# Patient Record
Sex: Female | Born: 1955 | Race: Black or African American | Hispanic: No | Marital: Married | Smoking: Former smoker
Health system: Southern US, Community
[De-identification: ages and names within clinical notes are randomized; demographics above are authoritative.]

## PROBLEM LIST (undated history)

## (undated) DIAGNOSIS — E785 Hyperlipidemia, unspecified: Secondary | ICD-10-CM

## (undated) DIAGNOSIS — I10 Essential (primary) hypertension: Secondary | ICD-10-CM

## (undated) DIAGNOSIS — C541 Malignant neoplasm of endometrium: Secondary | ICD-10-CM

## (undated) DIAGNOSIS — K089 Disorder of teeth and supporting structures, unspecified: Secondary | ICD-10-CM

## (undated) DIAGNOSIS — K224 Dyskinesia of esophagus: Secondary | ICD-10-CM

## (undated) DIAGNOSIS — J45909 Unspecified asthma, uncomplicated: Secondary | ICD-10-CM

## (undated) DIAGNOSIS — R011 Cardiac murmur, unspecified: Secondary | ICD-10-CM

## (undated) DIAGNOSIS — N183 Chronic kidney disease, stage 3 unspecified: Secondary | ICD-10-CM

## (undated) DIAGNOSIS — E66813 Obesity, class 3: Secondary | ICD-10-CM

## (undated) DIAGNOSIS — L8990 Pressure ulcer of unspecified site, unspecified stage: Secondary | ICD-10-CM

## (undated) DIAGNOSIS — E119 Type 2 diabetes mellitus without complications: Secondary | ICD-10-CM

## (undated) HISTORY — DX: Dyskinesia of esophagus: K22.4

## (undated) HISTORY — DX: Obesity, class 3: E66.813

## (undated) HISTORY — DX: Pressure ulcer of unspecified site, unspecified stage: L89.90

## (undated) HISTORY — PX: TYMPANOSTOMY TUBE PLACEMENT: SHX32

## (undated) HISTORY — DX: Disorder of teeth and supporting structures, unspecified: K08.9

## (undated) HISTORY — DX: Hyperlipidemia, unspecified: E78.5

## (undated) HISTORY — DX: Chronic kidney disease, stage 3 unspecified: N18.30

## (undated) HISTORY — DX: Malignant neoplasm of endometrium: C54.1

## (undated) HISTORY — DX: Morbid (severe) obesity due to excess calories: E66.01

## (undated) NOTE — *Deleted (*Deleted)
Radiation Oncology         [phone] ________________________________  Initial Outpatient Consultation  Name: Vanessa [name] MRN: [id number]  Date: 04/22/2020  DOB: 17-Feb-1956  CC:Pcp, No  Lafonda Mosses, MD   REFERRING PHYSICIAN: Lafonda Mosses, MD  DIAGNOSIS: There were no encounter diagnoses.  Stage I endometrioid adenocarcinoma, FIGO grade 3  HISTORY OF PRESENT ILLNESS::Vanessa [name] is a 5 y.o. female who is seen as a courtesy of Dr. Berline Lopes for an opinion concerning radiation therapy as part of management for her recently diagnosed endometrial cancer. Today, she is accompanied by ***. The patient presented to the ED on 09/14/2019 with complaints of elevated blood pressure and diffuse pruritis. Labs revealed a Hemoglobin level of 4.8 and Platelet count of 115. After further discussion, the patient stated that she had been having intermittent post-menopausal vaginal bleeding for six months. Transabdominal ultrasound of the pelvis was performed and showed a poorly defined endometrium. However, it appeared thickened and measured approximately 8-9 mm. The patient was admitted to the hospital for further evaluation and management.   While hospitalized, an abdominal/pelvic CT scan was performed on 09/15/2019 that was sub-optimal in the absence of IV contrast. However, it did show anemia, a small right-sided pleural effusion, trace abdominal ascites, and an atrophic left kidney that may have been secondary to a distal left ureteral stone that measured 7 mm. On that same day, the patient was evaluated by Dr. Kennon Rounds, OB/GYN, who recommended beginning Megace to stop the bleeding. She also recommended endometrial sampling +/- PAP smear.  As an inpatient, she was evaluated by Dr. Burr Medico on 09/17/2019, during which time it was recommended that she proceed with endometrial biopsy. Echocardiogram on 09/18/2019 showed an EF of 55-60%. PAP smear was performed on 09/19/2019 and revealed  atypical endometrial cells. An endometrial biopsy was also performed on that same day by Dr. Vevelyn Francois that revealed FIGO grade 2 endometrial adenocarcinoma. There was also a fragment of endometrial type polyp. The patient was stabilized and discharged to a skilled nursing facility on 09/25/2019.  The patient was seen in the ED again on 10/04/2019 for low hemoglobin levels and was once again admitted to the hospital for symptomatic anemia/GI bleed. While hospitalized, she underwent an upper endoscopy on 10/08/2019 by Dr. Watt Climes. Results showed a tiny hiatal hernia, a normal stomach, a non-bleeding small duodenal ulcer with no stigmata of bleeding, and a normal duodenum. Pathology from the procedure revealed mild, chronic, inactive gastritis of the stomach without evidence of H. Pylori, dysplasia, or malignancy. The patient was stabilized and discharged back to a skilled nursing facility on 10/10/2019 with a six-week course of IV antibiotics for bacteremia.  Given the above endometrial findings, the patient was referred to Dr. Berline Lopes and was seen in consultation on 10/17/2019. At that time, given her multiple co-morbidities, recent hospitalizations, and ongoing treatment of bacteremia, she was not considered a surgical candidate. Thus, it was recommended that the Megace be discontinued and that a hormonal IUD be placed.  The patient was seen in the ED again on 10/18/2019 with chief complaint of facial swelling. Chest x-ray at that time showed an ill-defined opacity in the right mid and lower lung zones, likely due to a degree of pneumonia with superimposed small pleural effusion. There was also some mild left base atelectasis. No adenopathy was evident. The patient was admitted to the hospital for worsening renal function, diffuse anasarca, markedly elevated BP, and concern for new onset CHF. She was stabilized and discharged  on 10/21/2019.  The patient was seen in the ED again on 11/05/2019 with chief complaint  of weakness. She was diagnosed with COVID-19 pneumonia and was admitted to the hospital. While hospitalized, she underwent a CT of chest, abdomen, and pelvis on 11/07/2019. Results showed multifocal geographic patchy ground-glass opacities throughout both lungs, consistent with COVID-19 pneumonia. It also showed fluid overload with moderate right and small left pleural effusion. Additionally, there was small to moderate volume abdominopelvic ascites, generalized subcutaneous edema of the chest and abdomen consistent with third-spacing, confluent edema in the flank, chronic left renal atrophy, unchanged 7 mm calcification in the left pelvis equivocally within the ureter, and question of nodular hepatic contours.  Given the CT findings above, the patient underwent an abdominal ultrasound on 11/16/2019. Results showed gallbladder sludge with associated gallbladder wall thickening, nodular appearance of the liver which could be seen in patients with cirrhosis, small volume abdominal ascites, and new right-sided pelviectasis of unknown clinical significance. There was no discrete hepatic mass. The patient was stabilized and discharged on 11/22/2019.  The patient was seen in follow-up with Dr. Berline Lopes on 12/02/2019, during which time she underwent a Mirena IUD insertion. She tolerated the procedure well.  Most recently, the patient was seen in the ED on 02/14/2020 for leg and arm pain s/p fall. Chest x-ray showed mildly increased brochovascular lung markings bilaterally without evidence of focal consolidation. There was also noted to be a small right pleural effusion. X-ray of left forearm was negative. X-ray of left humerus showed a proximal humeral fracture. The patient was discharged home with close orthopedic follow-up.   The patient was last seen by Dr. Berline Lopes on 02/21/2020, during which time an endometrial biopsy was performed. Results showed FIGO grade 3 endometrioid adenocarcinoma with focal secretory  features.  The patient's case was discussed at the Gynecologic Oncology Multi-Disciplinary Conference on 03/02/2020. It was recommended that she proceed with radiation/external beam radiation with Heyman's capsules.  PREVIOUS RADIATION THERAPY: No  PAST MEDICAL HISTORY:  Past Medical History:  Diagnosis Date  . Anemia 10/09/2019  . Asthma   . Cellulitis and abscess of lower extremity 04/22/2020  . CKD (chronic kidney disease) stage 3, GFR 30-59 ml/min (HCC)   . Dental disease   . Diabetes mellitus without complication ([place])   . Endometrial cancer ([place])   . Esophageal dysmotility   . Heart murmur   . HLD (hyperlipidemia)   . Hypertension   . MSSA bacteremia 09/2019  . Obesity, Class III, BMI 40-49.9 (morbid obesity) ([place])   . Pressure ulcer     PAST SURGICAL HISTORY: Past Surgical History:  Procedure Laterality Date  . BIOPSY  10/08/2019   Procedure: BIOPSY;  Surgeon: Clarene Essex, MD;  Location: [facility] ENDOSCOPY;  Service: Endoscopy;;  . ESOPHAGOGASTRODUODENOSCOPY N/A 10/08/2019   Procedure: ESOPHAGOGASTRODUODENOSCOPY (EGD);  Surgeon: Clarene Essex, MD;  Location: [facility];  Service: Endoscopy;  Laterality: N/A;  . TYMPANOSTOMY TUBE PLACEMENT Bilateral     FAMILY HISTORY:  Family History  Problem Relation Age of Onset  . Stroke Mother   . Diabetes Mother   . Hypertension Mother   . Cancer Mother        possible ovarian  . Diabetes Brother   . Hypertension Brother   . Cancer Maternal Grandmother        possible ovarian    SOCIAL HISTORY:  Social History   Tobacco Use  . Smoking status: Former Research scientist (life sciences)  . Smokeless tobacco: Never Used  . Tobacco comment: " years  ago ", >73yrs  Vaping Use  . Vaping Use: Never used  Substance Use Topics  . Alcohol use: Yes    Comment: occasional  . Drug use: No    ALLERGIES:  Allergies  Allergen Reactions  . Ace Inhibitors     Never prescribed but she has had angioedema  . Angiotensin Receptor Blockers     ARB never Rxed but  PMH  of angioedema in March 2021    MEDICATIONS:  No current facility-administered medications for this encounter.   No current outpatient medications on file.   Facility-Administered Medications Ordered in Other Encounters  Medication Dose Route Frequency Provider Last Rate Last Admin  . acetaminophen (TYLENOL) tablet 650 mg  650 mg Oral Q6H PRN Etta Quill, DO       Or  . acetaminophen (TYLENOL) suppository 650 mg  650 mg Rectal Q6H PRN Etta Quill, DO      . aspirin chewable tablet 81 mg  81 mg Oral Daily Alcario Drought, Jared M, DO      . carvedilol (COREG) tablet 6.25 mg  6.25 mg Oral BID WC Petrucelli, Samantha R, PA-C   6.25 mg at 04/22/20 0757  . cefTRIAXone (ROCEPHIN) 2 g in sodium chloride 0.9 % 100 mL IVPB  2 g Intravenous Q24H Alcario Drought, Jared M, DO      . cloNIDine (CATAPRES) tablet 0.2 mg  0.2 mg Oral BID Alcario Drought, Jared M, DO      . hydrocerin (EUCERIN) cream   Topical Daily Swayze, Ava, DO      . insulin aspart (novoLOG) injection 0-5 Units  0-5 Units Subcutaneous QHS Alcario Drought, Jared M, DO      . insulin aspart (novoLOG) injection 0-9 Units  0-9 Units Subcutaneous TID WC Etta Quill, DO   1 Units at 04/22/20 0802  . labetalol (NORMODYNE) injection 10-20 mg  10-20 mg Intravenous Q2H PRN Etta Quill, DO      . metroNIDAZOLE (FLAGYL) IVPB 500 mg  500 mg Intravenous Q8H Jennette Kettle M, DO 100 mL/hr at 04/22/20 0757 500 mg at 04/22/20 0757  . ondansetron (ZOFRAN) tablet 4 mg  4 mg Oral Q6H PRN Etta Quill, DO       Or  . ondansetron [facility]) injection 4 mg  4 mg Intravenous Q6H PRN Etta Quill, DO      . potassium chloride SA (KLOR-CON) CR tablet 40 mEq  40 mEq Oral Once Swayze, Ava, DO      . [START ON 04/23/2020] vancomycin (VANCOCIN) IVPB 1000 mg/200 mL premix  1,000 mg Intravenous Q24H Erenest Blank, RPH        REVIEW OF SYSTEMS:  A 10+ POINT REVIEW OF SYSTEMS WAS OBTAINED including neurology, dermatology, psychiatry, cardiac, respiratory, lymph,  extremities, GI, GU, musculoskeletal, constitutional, reproductive, HEENT. ***   PHYSICAL EXAM:  vitals were not taken for this visit.   General: Alert and oriented, in no acute distress HEENT: Head is normocephalic. Extraocular movements are intact. Oropharynx is clear. Neck: Neck is supple, no palpable cervical or supraclavicular lymphadenopathy. Heart: Regular in rate and rhythm with no murmurs, rubs, or gallops. Chest: Clear to auscultation bilaterally, with no rhonchi, wheezes, or rales. Abdomen: Soft, nontender, nondistended, with no rigidity or guarding. Extremities: No cyanosis or edema. Lymphatics: see Neck Exam Skin: No concerning lesions. Musculoskeletal: symmetric strength and muscle tone throughout. Neurologic: Cranial nerves II through XII are grossly intact. No obvious focalities. Speech is fluent. Coordination is intact. Psychiatric: Judgment and insight are  intact. Affect is appropriate. ***  ECOG = ***  0 - Asymptomatic (Fully active, able to carry on all predisease activities without restriction)  1 - Symptomatic but completely ambulatory (Restricted in physically strenuous activity but ambulatory and able to carry out work of a light or sedentary nature. For example, light housework, office work)  2 - Symptomatic, <50% in bed during the day (Ambulatory and capable of all self care but unable to carry out any work activities. Up and about more than 50% of waking hours)  3 - Symptomatic, >50% in bed, but not bedbound (Capable of only limited self-care, confined to bed or chair 50% or more of waking hours)  4 - Bedbound (Completely disabled. Cannot carry on any self-care. Totally confined to bed or chair)  5 - Death   Eustace Pen MM, Creech RH, Tormey DC, et al. [phone]). "Toxicity and response criteria of the [facility]". [place] Oncol. 5 (6): 649-55  LABORATORY DATA:  Lab Results  Component Value Date   WBC 4.1 04/22/2020   HGB 8.6 (L)  04/22/2020   HCT 27.9 (L) 04/22/2020   MCV 90.0 04/22/2020   PLT 232 04/22/2020   NEUTROABS 2.9 04/22/2020   Lab Results  Component Value Date   NA 142 04/22/2020   K 3.2 (L) 04/22/2020   CL 112 (H) 04/22/2020   CO2 23 04/22/2020   GLUCOSE 131 (H) 04/22/2020   CREATININE 2.20 (H) 04/22/2020   CALCIUM 8.2 (L) 04/22/2020      RADIOGRAPHY: DG Chest 2 View  Result Date: 04/22/2020 CLINICAL DATA:  Recurrent staph infection EXAM: CHEST - 2 VIEW COMPARISON:  02/14/2020 FINDINGS: Small right pleural effusion has developed, new since prior examination mild associated right basilar atelectasis. Lungs are otherwise clear. No pneumothorax. No pleural effusion on the left. Moderate cardiomegaly is stable. Pulmonary vascularity is normal. Healing left proximal humeral fracture again noted. No acute bone abnormality. IMPRESSION: Interval development of small right pleural effusion. Stable moderate cardiomegaly. Electronically Signed   By: Fidela Salisbury MD   On: 04/22/2020 01:33      IMPRESSION: Stage I endometrioid adenocarcinoma, FIGO grade 3  ***  Today, I talked to the patient and family about the findings and work-up thus far.  We discussed the natural history of endometrial cancer and general treatment, highlighting the role of radiotherapy in the management.  We discussed the available radiation techniques, and focused on the details of logistics and delivery.  We reviewed the anticipated acute and late sequelae associated with radiation in this setting.  The patient was encouraged to ask questions that I answered to the best of my ability. *** A patient consent form was discussed and signed.  We retained a copy for our records.  The patient would like to proceed with radiation and will be scheduled for CT simulation.  PLAN: ***  Total time spent in this encounter was *** minutes which included reviewing the patient's most recent ED visits, hospitalizations, echocardiogram, consultations,  biopsies, CT scans, chest x-rays, abdominal ultrasound, IUD insertion, follow-ups, physical examination, and documentation.   ------------------------------------------------  Blair Promise, PhD, MD  This document serves as a record of services personally performed by Gery Pray, MD. It was created on his behalf by Clerance Lav, a trained medical scribe. The creation of this record is based on the scribe's personal observations and the provider's statements to them. This document has been checked and approved by the attending provider.

---

## 2014-03-06 ENCOUNTER — Encounter (HOSPITAL_COMMUNITY): Payer: Self-pay | Admitting: Certified Registered Nurse Anesthetist

## 2014-03-06 ENCOUNTER — Inpatient Hospital Stay (HOSPITAL_COMMUNITY): Payer: Medicaid - Out of State

## 2014-03-06 ENCOUNTER — Encounter (HOSPITAL_COMMUNITY): Payer: Self-pay | Admitting: *Deleted

## 2014-03-06 ENCOUNTER — Inpatient Hospital Stay (HOSPITAL_COMMUNITY)
Admission: AD | Admit: 2014-03-06 | Discharge: 2014-03-06 | Disposition: A | Discharge status: Home / Self Care | Payer: Medicaid - Out of State | Source: Ambulatory Visit | Attending: Emergency Medicine | Admitting: Emergency Medicine

## 2014-03-06 DIAGNOSIS — Z7982 Long term (current) use of aspirin: Secondary | ICD-10-CM | POA: Diagnosis not present

## 2014-03-06 DIAGNOSIS — E876 Hypokalemia: Secondary | ICD-10-CM | POA: Insufficient documentation

## 2014-03-06 DIAGNOSIS — Z87891 Personal history of nicotine dependence: Secondary | ICD-10-CM | POA: Diagnosis not present

## 2014-03-06 DIAGNOSIS — J45909 Unspecified asthma, uncomplicated: Secondary | ICD-10-CM | POA: Insufficient documentation

## 2014-03-06 DIAGNOSIS — Z760 Encounter for issue of repeat prescription: Secondary | ICD-10-CM | POA: Insufficient documentation

## 2014-03-06 DIAGNOSIS — E119 Type 2 diabetes mellitus without complications: Secondary | ICD-10-CM | POA: Diagnosis not present

## 2014-03-06 DIAGNOSIS — R011 Cardiac murmur, unspecified: Secondary | ICD-10-CM | POA: Diagnosis not present

## 2014-03-06 DIAGNOSIS — E1165 Type 2 diabetes mellitus with hyperglycemia: Secondary | ICD-10-CM

## 2014-03-06 DIAGNOSIS — Z79899 Other long term (current) drug therapy: Secondary | ICD-10-CM | POA: Diagnosis not present

## 2014-03-06 DIAGNOSIS — Z794 Long term (current) use of insulin: Secondary | ICD-10-CM | POA: Insufficient documentation

## 2014-03-06 DIAGNOSIS — I10 Essential (primary) hypertension: Secondary | ICD-10-CM | POA: Diagnosis not present

## 2014-03-06 DIAGNOSIS — R7309 Other abnormal glucose: Secondary | ICD-10-CM

## 2014-03-06 DIAGNOSIS — R739 Hyperglycemia, unspecified: Secondary | ICD-10-CM

## 2014-03-06 HISTORY — DX: Cardiac murmur, unspecified: R01.1

## 2014-03-06 HISTORY — DX: Unspecified asthma, uncomplicated: J45.909

## 2014-03-06 HISTORY — DX: Type 2 diabetes mellitus without complications: E11.9

## 2014-03-06 HISTORY — DX: Essential (primary) hypertension: I10

## 2014-03-06 LAB — COMPREHENSIVE METABOLIC PANEL
ALT: 19 U/L (ref 0–35)
AST: 14 U/L (ref 0–37)
Albumin: 3.6 g/dL (ref 3.5–5.2)
Alkaline Phosphatase: 131 U/L — ABNORMAL HIGH (ref 39–117)
Anion gap: 14 (ref 5–15)
BILIRUBIN TOTAL: 0.3 mg/dL (ref 0.3–1.2)
BUN: 15 mg/dL (ref 6–23)
CHLORIDE: 98 meq/L (ref 96–112)
CO2: 27 meq/L (ref 19–32)
CREATININE: 0.96 mg/dL (ref 0.50–1.10)
Calcium: 8.8 mg/dL (ref 8.4–10.5)
GFR, EST AFRICAN AMERICAN: 74 mL/min — AB (ref >90)
GFR, EST NON AFRICAN AMERICAN: 63 mL/min — AB (ref >90)
GLUCOSE: 530 mg/dL — AB (ref 70–99)
Potassium: 3.4 mEq/L — ABNORMAL LOW (ref 3.7–5.3)
Sodium: 139 mEq/L (ref 137–147)
Total Protein: 7.9 g/dL (ref 6.0–8.3)

## 2014-03-06 LAB — URINALYSIS, ROUTINE W REFLEX MICROSCOPIC
Bilirubin Urine: NEGATIVE
Glucose, UA: >1000 mg/dL — AB
Ketones, ur: NEGATIVE mg/dL
Leukocytes, UA: NEGATIVE
Nitrite: NEGATIVE
Protein, ur: 100 mg/dL — AB
Specific Gravity, Urine: 1.03 (ref 1.005–1.030)
Urobilinogen, UA: 0.2 mg/dL (ref 0.0–1.0)
pH: 6.5 (ref 5.0–8.0)

## 2014-03-06 LAB — CBC WITH DIFFERENTIAL/PLATELET
Basophils Absolute: 0 10*3/uL (ref 0.0–0.1)
Basophils Relative: 0 % (ref 0–1)
EOS ABS: 0.2 10*3/uL (ref 0.0–0.7)
Eosinophils Relative: 2 % (ref 0–5)
HCT: 35.5 % — ABNORMAL LOW (ref 36.0–46.0)
HEMOGLOBIN: 11.9 g/dL — AB (ref 12.0–15.0)
Lymphocytes Relative: 40 % (ref 12–46)
Lymphs Abs: 3.2 10*3/uL (ref 0.7–4.0)
MCH: 28.7 pg (ref 26.0–34.0)
MCHC: 33.5 g/dL (ref 30.0–36.0)
MCV: 85.5 fL (ref 78.0–100.0)
MONOS PCT: 6 % (ref 3–12)
Monocytes Absolute: 0.4 10*3/uL (ref 0.1–1.0)
NEUTROS PCT: 52 % (ref 43–77)
Neutro Abs: 4.2 10*3/uL (ref 1.7–7.7)
Platelets: 308 10*3/uL (ref 150–400)
RBC: 4.15 MIL/uL (ref 3.87–5.11)
RDW: 15.1 % (ref 11.5–15.5)
WBC: 8 10*3/uL (ref 4.0–10.5)

## 2014-03-06 LAB — BASIC METABOLIC PANEL
Anion gap: 14 (ref 5–15)
BUN: 13 mg/dL (ref 6–23)
CHLORIDE: 104 meq/L (ref 96–112)
CO2: 25 meq/L (ref 19–32)
Calcium: 8.9 mg/dL (ref 8.4–10.5)
Creatinine, Ser: 0.85 mg/dL (ref 0.50–1.10)
GFR calc Af Amer: 85 mL/min — ABNORMAL LOW (ref >90)
GFR calc non Af Amer: 74 mL/min — ABNORMAL LOW (ref >90)
GLUCOSE: 327 mg/dL — AB (ref 70–99)
Potassium: 3.4 mEq/L — ABNORMAL LOW (ref 3.7–5.3)
Sodium: 143 mEq/L (ref 137–147)

## 2014-03-06 LAB — URINE MICROSCOPIC-ADD ON

## 2014-03-06 LAB — GLUCOSE, CAPILLARY
GLUCOSE-CAPILLARY: 451 mg/dL — AB (ref 70–99)
Glucose-Capillary: 535 mg/dL — ABNORMAL HIGH (ref 70–99)

## 2014-03-06 LAB — I-STAT TROPONIN, ED: Troponin i, poc: 0 ng/mL (ref 0.00–0.08)

## 2014-03-06 LAB — CBG MONITORING, ED: Glucose-Capillary: 352 mg/dL — ABNORMAL HIGH (ref 70–99)

## 2014-03-06 MED ORDER — "INSULIN SYRINGE 29G X 1/2"" 1 ML MISC": Status: DC

## 2014-03-06 MED ORDER — FUROSEMIDE 40 MG PO TABS: 40.0000 mg | ORAL_TABLET | Freq: Two times a day (BID) | ORAL | Status: DC

## 2014-03-06 MED ORDER — TRUERESULT BLOOD GLUCOSE W/DEVICE KIT: PACK | Status: DC

## 2014-03-06 MED ORDER — INSULIN NPH ISOPHANE & REGULAR (70-30) 100 UNIT/ML ~~LOC~~ SUSP: 20.0000 [IU] | Freq: Two times a day (BID) | SUBCUTANEOUS | Status: DC

## 2014-03-06 MED ORDER — TRUEPLUS LANCETS 28G MISC: Status: DC

## 2014-03-06 MED ORDER — GLUCOSE BLOOD VI STRP: ORAL_STRIP | Status: DC

## 2014-03-06 MED ORDER — LISINOPRIL 20 MG PO TABS: 40.0000 mg | ORAL_TABLET | Freq: Every day | ORAL | Status: DC

## 2014-03-06 MED ORDER — AMLODIPINE BESYLATE 10 MG PO TABS: 10.0000 mg | ORAL_TABLET | Freq: Every day | ORAL | Status: DC

## 2014-03-06 MED ORDER — INSULIN REGULAR HUMAN 100 UNIT/ML IJ SOLN
10.0000 [IU] | Freq: Once | INTRAMUSCULAR | Status: AC
  Administered 2014-03-06: 12:00:00 via SUBCUTANEOUS
  Filled 2014-03-06: qty 1

## 2014-03-06 NOTE — Discharge Instructions (Signed)
Hypokalemia Hypokalemia means that the amount of potassium in the blood is lower than normal.Potassium is a chemical, called an electrolyte, that helps regulate the amount of fluid in the body. It also stimulates muscle contraction and helps nerves function properly.Most of the body's potassium is inside of cells, and only a very small amount is in the blood. Because the amount in the blood is so small, minor changes can be life-threatening. CAUSES  Antibiotics.  Diarrhea or vomiting.  Using laxatives too much, which can cause diarrhea.  Chronic kidney disease.  Water pills (diuretics).  Eating disorders (bulimia).  Low magnesium level.  Sweating a lot. SIGNS AND SYMPTOMS  Weakness.  Constipation.  Fatigue.  Muscle cramps.  Mental confusion.  Skipped heartbeats or irregular heartbeat (palpitations).  Tingling or numbness. DIAGNOSIS  Your health care provider can diagnose hypokalemia with blood tests. In addition to checking your potassium level, your health care provider may also check other lab tests. TREATMENT Hypokalemia can be treated with potassium supplements taken by mouth or adjustments in your current medicines. If your potassium level is very low, you may need to get potassium through a vein (IV) and be monitored in the hospital. A diet high in potassium is also helpful. Foods high in potassium are:  Nuts, such as peanuts and pistachios.  Seeds, such as sunflower seeds and pumpkin seeds.  Peas, lentils, and lima beans.  Whole grain and bran cereals and breads.  Fresh fruit and vegetables, such as apricots, avocado, bananas, cantaloupe, kiwi, oranges, tomatoes, asparagus, and potatoes.  Orange and tomato juices.  Red meats.  Fruit yogurt. HOME CARE INSTRUCTIONS  Take all medicines as prescribed by your health care provider.  Maintain a healthy diet by including nutritious food, such as fruits, vegetables, nuts, whole grains, and lean meats.  If  you are taking a laxative, be sure to follow the directions on the label. SEEK MEDICAL CARE IF:  Your weakness gets worse.  You feel your heart pounding or racing.  You are vomiting or having diarrhea.  You are diabetic and having trouble keeping your blood glucose in the normal range. SEEK IMMEDIATE MEDICAL CARE IF:  You have chest pain, shortness of breath, or dizziness.  You are vomiting or having diarrhea for more than 2 days.  You faint. MAKE SURE YOU:   Understand these instructions.  Will watch your condition.  Will get help right away if you are not doing well or get worse. Document Released: 06/27/2005 Document Revised: 04/17/2013 Document Reviewed: 12/28/2012 [facility] Patient Information 2015 [place], [place]. This information is not intended to replace advice given to you by your health care provider. Make sure you discuss any questions you have with your health care provider. Hyperglycemia Hyperglycemia occurs when the glucose (sugar) in your blood is too high. Hyperglycemia can happen for many reasons, but it most often happens to people who do not know they have diabetes or are not managing their diabetes properly.  CAUSES  Whether you have diabetes or not, there are other causes of hyperglycemia. Hyperglycemia can occur when you have diabetes, but it can also occur in other situations that you might not be as aware of, such as: Diabetes  If you have diabetes and are having problems controlling your blood glucose, hyperglycemia could occur because of some of the following reasons:  Not following your meal plan.  Not taking your diabetes medications or not taking it properly.  Exercising less or doing less activity than you normally do.  Being  sick. Pre-diabetes  This cannot be ignored. Before people develop Type 2 diabetes, they almost always have "pre-diabetes." This is when your blood glucose levels are higher than normal, but not yet high enough to be  diagnosed as diabetes. Research has shown that some long-term damage to the body, especially the heart and circulatory system, may already be occurring during pre-diabetes. If you take action to manage your blood glucose when you have pre-diabetes, you may delay or prevent Type 2 diabetes from developing. Stress  If you have diabetes, you may be "diet" controlled or on oral medications or insulin to control your diabetes. However, you may find that your blood glucose is higher than usual in the hospital whether you have diabetes or not. This is often referred to as "stress hyperglycemia." Stress can elevate your blood glucose. This happens because of hormones put out by the body during times of stress. If stress has been the cause of your high blood glucose, it can be followed regularly by your caregiver. That way he/she can make sure your hyperglycemia does not continue to get worse or progress to diabetes. Steroids  Steroids are medications that act on the infection fighting system (immune system) to block inflammation or infection. One side effect can be a rise in blood glucose. Most people can produce enough extra insulin to allow for this rise, but for those who cannot, steroids make blood glucose levels go even higher. It is not unusual for steroid treatments to "uncover" diabetes that is developing. It is not always possible to determine if the hyperglycemia will go away after the steroids are stopped. A special blood test called an A1c is sometimes done to determine if your blood glucose was elevated before the steroids were started. SYMPTOMS  Thirsty.  Frequent urination.  Dry mouth.  Blurred vision.  Tired or fatigue.  Weakness.  Sleepy.  Tingling in feet or leg. DIAGNOSIS  Diagnosis is made by monitoring blood glucose in one or all of the following ways:  A1c test. This is a chemical found in your blood.  Fingerstick blood glucose monitoring.  Laboratory results. TREATMENT    First, knowing the cause of the hyperglycemia is important before the hyperglycemia can be treated. Treatment may include, but is not be limited to:  Education.  Change or adjustment in medications.  Change or adjustment in meal plan.  Treatment for an illness, infection, etc.  More frequent blood glucose monitoring.  Change in exercise plan.  Decreasing or stopping steroids.  Lifestyle changes. HOME CARE INSTRUCTIONS   Test your blood glucose as directed.  Exercise regularly. Your caregiver will give you instructions about exercise. Pre-diabetes or diabetes which comes on with stress is helped by exercising.  Eat wholesome, balanced meals. Eat often and at regular, fixed times. Your caregiver or nutritionist will give you a meal plan to guide your sugar intake.  Being at an ideal weight is important. If needed, losing as little as 10 to 15 pounds may help improve blood glucose levels. SEEK MEDICAL CARE IF:   You have questions about medicine, activity, or diet.  You continue to have symptoms (problems such as increased thirst, urination, or weight gain). SEEK IMMEDIATE MEDICAL CARE IF:   You are vomiting or have diarrhea.  Your breath smells fruity.  You are breathing faster or slower.  You are very sleepy or incoherent.  You have numbness, tingling, or pain in your feet or hands.  You have chest pain.  Your symptoms get worse even  though you have been following your caregiver's orders.  If you have any other questions or concerns. Document Released: 12/21/2000 Document Revised: 09/19/2011 Document Reviewed: 10/24/2011 [facility] Patient Information 2015 [place], [place]. This information is not intended to replace advice given to you by your health care provider. Make sure you discuss any questions you have with your health care provider.   Emergency Department Resource Guide 1) Find a Doctor and Pay Out of Pocket Although you won't have to find out who is  covered by your insurance plan, it is a good idea to ask around and get recommendations. You will then need to call the office and see if the doctor you have chosen will accept you as a new patient and what types of options they offer for patients who are self-pay. Some doctors offer discounts or will set up payment plans for their patients who do not have insurance, but you will need to ask so you aren't surprised when you get to your appointment.  2) Contact Your Local Health Department Not all health departments have doctors that can see patients for sick visits, but many do, so it is worth a call to see if yours does. If you don't know where your local health department is, you can check in your phone book. The CDC also has a tool to help you locate your state's health department, and many state websites also have listings of all of their local health departments.  3) Find a [facility] If your illness is not likely to be very severe or complicated, you may want to try a walk in clinic. These are popping up all over the country in pharmacies, drugstores, and shopping centers. They're usually staffed by nurse practitioners or physician assistants that have been trained to treat common illnesses and complaints. They're usually fairly quick and inexpensive. However, if you have serious medical issues or chronic medical problems, these are probably not your best option.  No Primary Care Doctor: - Call Health Connect at  [phone] - they can help you locate a primary care doctor that  accepts your insurance, provides certain services, etc. - Physician Referral Service- [phone]  Chronic Pain Problems: Organization         Address  Phone   Notes  [facility]  [phone] Patients need to be referred by their primary care doctor.   Medication Assistance: Organization         Address  Phone   Notes  [facility] [address] [place], [place] [address] [phone] --Must be a resident of [facility] -- Must have NO insurance coverage whatsoever (no Medicaid/ Medicare, etc.) -- The pt. MUST have a primary care doctor that directs their care regularly and follows them in the community   MedAssist  [phone]   [employer]  [phone]    Agencies that provide inexpensive medical care: Organization         Address  Phone   Notes  [place]  [phone]   [name]    [phone]   [facility] [place], [place] [address] [phone]   [place] [address] [place] [phone]   Planned Parenthood    [phone]   [facility]    [phone]   [place] and [place] [place], [employer] Phone:  [phone], Fax:  [phone] Hours of Operation:  9 am - 6 pm, M-F.  Also accepts Medicaid/Medicare and self-pay.  [facility] for [place] [address], [address], [place] Phone: [phone], Fax: [phone]. Hours of Operation:  8:30 am - 5:30 pm, M-F.  Also accepts Medicaid and self-pay.  [facility] [place] [address] [place] Phone: [phone]   [place], [place], [place] [phone], Ext. [phone] Mondays & Thursdays: 7-9 AM.  First 15 patients are seen on a first come, first serve basis.    [place] Providers:  Organization         Address  Phone   Notes  [facility] [address], [place] [phone] Also accepts self-pay patients.  [facility] [id number] [place], [place]  [phone]   [place], [address], [place] [phone]   [facility] [address], [place] [phone]   Lucianne Lei [address], [place]   [phone] Only accepts [place] Access [place] patients after they have their name applied to their card.   Self-Pay (no insurance) in [facility]:  Organization         Address  Phone   Notes  Sickle Cell Patients, [facility] Internal Medicine [place] [phone]   [facility] [place] [phone]   [name] [place], [place],  [phone]   [facility]/Dr. Osei-Bonsu  [address], [place] or [address], [place] [phone] Phone number for both [place] and [place] locations is the same.  Urgent Medical and [facility] [address], [place] [phone]   [facility] [address] [place] or [address] [phone]   [facility] [address] [place] [phone], phone; [phone], fax Sees patients 1st and 3rd Saturday of every month.  Must not qualify for public or private insurance (i.e. Medicaid, Medicare, [place] Health Choice, Veterans' Benefits)  Household income should be no more than 200% of the poverty level The clinic cannot treat you if you are pregnant or think you are pregnant  Sexually transmitted diseases are not treated at the clinic.    Dental Care: Organization         Address  Phone  Notes  [facility] Department of [facility] [place] [phone] Accepts children up to age 28 who are enrolled in [place] or [place]; pregnant women with a Medicaid card; and children who have applied for Medicaid or [place] Health Choice, but were declined, whose parents can pay a reduced fee at time of service.  [facility] Department of [facility]  [address], [place] [phone] Accepts children up to age 11 who are enrolled  in [place] or [place]; pregnant women with a Medicaid card; and children who have applied for Medicaid or  Health Choice, but were declined, whose parents can pay a reduced fee at time of service.  [place] Adult [facility] Access PROGRAM  [place] [phone] Patients are seen by appointment only. Walk-ins  are not accepted. [place] will see patients 76 years of age and older. Monday - Tuesday (8am-5pm) Most Wednesdays (8:30-5pm) $30 per visit, cash only  [facility] Adult Dental Access PROGRAM  [address], [facility] [phone] Patients are seen by appointment only. Walk-ins are not accepted. [place] will see patients 16 years of age and older. One Wednesday Evening (Monthly: Volunteer Based).  $30 per visit, cash only  [place]  [phone] for adults; Children under age 71, call Graduate Pediatric Dentistry at [phone]. Children aged 4-14, please call [phone] to request a pediatric application.  Dental services are provided in all areas of dental care including fillings, crowns and bridges, complete and partial dentures, implants, gum treatment, root canals, and extractions. Preventive care is also provided. Treatment is provided to both adults and children. Patients are selected via a lottery and there is often a waiting list.   [facility] [address], [place]  [phone] [email or web address]   [facility] [address] [place], [place] [phone], Ext. [phone] Second and Fourth Thursday of each month, opens at 6:30 AM; Clinic ends at 9 AM.  Patients are seen on a first-come first-served basis, and a limited number are seen during each clinic.   [facility]  [address] Hillard Danker [place], [place] [phone]   Eligibility Requirements You must have lived in [place], [place], or [place] counties for at least the last three months.   You cannot be  eligible for state or federal sponsored [employer], including [employer], [place], or [employer].   You generally cannot be eligible for healthcare insurance through your employer.    How to apply: Eligibility screenings are held every Tuesday and Wednesday afternoon from 1:00 pm until 4:00 pm. You do not need an appointment for the interview!  [facility] [address] [place], [place]   [place]  [place] Department  [place]  [phone]    Behavioral Health Resources in the Community: Intensive Outpatient Programs Organization         Address  Phone  Notes  [place]. [address] [place], [place] [phone]   [facility] Outpatient [address] [place], [address] [place]   ADS: Alcohol & Drug Svcs [address], [place], [place]   [place] [address]  [place], [place] or [phone]   Substance Abuse Resources Organization         Address  Phone  Notes  Alcohol and Drug Services  [phone]   [place]  [phone]   [facility] [place]   Chinita Pester  [phone]   Residential & Outpatient Substance Abuse Program  6107718822   Psychological Services Organization         Address  Phone  Notes  [facility] [place]  [place]  [phone]   [place] [address], [place] [phone] or [phone]    Mobile Crisis Teams Organization         Address  Phone  Notes  [facility]atives, Mobile Crisis Care Unit  [phone]   [facility]ive Psychotherapeutic Services  [address] [place], [place]   [facility] [address] [place] [phone]    Self-Help/Support  Groups Organization          Address  Phone             Notes  [facility]ental [facility]oc[facility] of [place] - variety of support groups  [place] Call for more information  Narcotics Anonymous (NA), [facility] [address], [employer]   2 meetings at this location   Special educational needs teacher         Address  Phone  Notes  [facility]AP Residential Treatment [place],    Magazine  [phone]   [facility]  [address] [place] [id number], [place], [place]   [place], [place] [phone] Admissions: 8am-3pm M-F  Incentives Substance [place] [address]    [place], [place] [phone]   [facility] [address] [place], [place], [place]   [facility] [address]  [place], [place]   [facility] Programs - Intensive Outpatient [address] Kristeen [address], [place], [place]   [facility] ([place].) [place].,  [place], [place] [phone] or [phone]   [facility] Services (RTS) [address], [place], [place] Accepts Medicaid  [facility] [place] [address]  [place] 1-7242299750 Substance Abuse/Addiction Treatment   [facility] Organization         Address  Phone  Notes  [facility]  [phone]   Domenic Schwab, PhD [address] Arlis [place], [place]   [phone] or [phone]   [place], [place] [phone]   [facility] [address] [place], [place] [phone] Insurance/Medicaid/sponsorship through [facility] and Families [address], [address] [place]                                    [place], [place] [phone] [place] [address], [place] [phone]    Dr. Adele Schilder  [phone]   [facility] Dept. 1) [address] [place] 2) [place] 3)  [place] [address], [place] [phone]  [phone]   [place] [phone] or [phone] (After Hours)

## 2014-03-06 NOTE — MAU Provider Note (Signed)
Attestation of Attending Supervision of Advanced Practitioner (CNM/NP): Evaluation and management procedures were performed by the Advanced Practitioner under my supervision and collaboration. I have reviewed the Advanced Practitioner's note and chart, and I agree with the management and plan.  Dondi Burandt H. 3:24 PM

## 2014-03-06 NOTE — ED Notes (Signed)
Pt aware urine sample is needed 

## 2014-03-06 NOTE — ED Provider Notes (Signed)
CSN: [id number]     Arrival date & time 03/06/14  1042 History   First MD Initiated Contact with Patient 03/06/14 1457     Chief Complaint  Patient presents with  . Medication Refill   HPI  Patient is a 58 y.o. Female who [facility].  Patient states that she recently moved to [facility] from [place] and has been unable to get her medications transferred here or see a doctor here.  Patient states that she last took her insulin yesterday, but has been taking a reduced dose of her novolin to try and stretch out her medications.  Patient states that she began to feel bad yesterday.  She complains of weakness, malaise, a "funny feeling in my chest", some mild shortness of breath, increased thirst, and increased urination. Patient denies fever, chills, nausea, vomiting, dysuria, hematuria, diarrhea, constipation, leg swelling, PND, and orthopnea.  Patient states that she has no history of MI or CVA.  All other ROS are negative.    Past Medical History  Diagnosis Date  . Asthma   . Diabetes mellitus without complication   . Hypertension   . Heart murmur    History reviewed. No pertinent past surgical history. History reviewed. No pertinent family history. History  Substance Use Topics  . Smoking status: Former Research scientist (life sciences)  . Smokeless tobacco: Not on file  . Alcohol Use: No   OB History   Grav Para Term Preterm Abortions TAB SAB Ect Mult Living   4 4 3 1            Review of Systems See HPI Allergies  Review of patient's allergies indicates no known allergies.  Home Medications   Prior to Admission medications   Medication Sig Start Date End Date Taking? Authorizing Provider  amLODipine (NORVASC) 10 MG tablet Take 10 mg by mouth daily.   Yes Historical Provider, MD  amLODipine (NORVASC) 10 MG tablet Take 1 tablet (10 mg total) by mouth daily. 03/06/14   Dahmir Epperly A Forcucci, PA-C  aspirin 81 MG tablet Take 81 mg by mouth daily.   Yes  Historical Provider, MD  atorvastatin (LIPITOR) 40 MG tablet Take 40 mg by mouth daily.   Yes Historical Provider, MD  Blood Glucose Monitoring Suppl (TRUERESULT BLOOD GLUCOSE) W/DEVICE KIT Dispense 1 kit 03/06/14   Onie Kasparek A Forcucci, PA-C  fluticasone (FLONASE) 50 MCG/ACT nasal spray Place 1 spray into both nostrils 2 (two) times daily.   Yes Historical Provider, MD  furosemide (LASIX) 40 MG tablet Take 40 mg by mouth 2 (two) times daily.   Yes Historical Provider, MD  furosemide (LASIX) 40 MG tablet Take 1 tablet (40 mg total) by mouth 2 (two) times daily. 03/06/14   Cass Vandermeulen A Forcucci, PA-C  glucose blood test strip Use as instructed 03/06/14   Lyndall Windt A Forcucci, PA-C  insulin NPH-regular Human (NOVOLIN 70/30) (70-30) 100 UNIT/ML injection Inject 20 Units into the skin 2 (two) times daily with a meal.   Yes Historical Provider, MD  insulin NPH-regular Human (NOVOLIN 70/30) (70-30) 100 UNIT/ML injection Inject 20 Units into the skin 2 (two) times daily with a meal. 03/06/14   Carline Dura A Forcucci, PA-C  INSULIN SYRINGE 1CC/29G 29G X 1/2" 1 ML MISC Insulin syringes 03/06/14   Nekeya Briski A Forcucci, PA-C  lisinopril (PRINIVIL,ZESTRIL) 20 MG tablet Take 2 tablets (40 mg total) by mouth daily. 03/06/14   Britney Captain A Forcucci, PA-C  lisinopril (PRINIVIL,ZESTRIL) 40 MG tablet Take 40  mg by mouth daily.   Yes Historical Provider, MD  TRUEPLUS LANCETS 28G MISC Dispense 100 count lancets 03/06/14   Shunna Mikaelian A Forcucci, PA-C   BP 140/53  Pulse 85  Temp(Src) 97.6 F (36.4 C) (Oral)  Resp 20  Ht 4' 11"  (1.499 m)  Wt 227 lb (102.967 kg)  BMI 45.82 kg/m2  SpO2 98% Physical Exam  Nursing note and vitals reviewed. Constitutional: She is oriented to person, place, and time. She appears well-developed and well-nourished. No distress.  HENT:  Head: Normocephalic and atraumatic.  Mouth/Throat: Oropharynx is clear and moist. No oropharyngeal exudate.  Eyes: Conjunctivae and EOM are normal. Pupils are equal,  round, and reactive to light. No scleral icterus.  Neck: Normal range of motion. Neck supple. No JVD present. No thyromegaly present.  Cardiovascular: Normal rate, regular rhythm, normal heart sounds and intact distal pulses.  Exam reveals no gallop and no friction rub.   No murmur heard. Pulmonary/Chest: Effort normal and breath sounds normal. No respiratory distress. She has no wheezes. She has no rales. She exhibits no tenderness.  Abdominal: Soft. Bowel sounds are normal. She exhibits no distension and no mass. There is no tenderness. There is no rebound and no guarding.  Musculoskeletal: Normal range of motion.  Lymphadenopathy:    She has no cervical adenopathy.  Neurological: She is alert and oriented to person, place, and time. She has normal strength. No cranial nerve deficit or sensory deficit. Coordination normal.  Skin: Skin is warm and dry. She is not diaphoretic.  Psychiatric: She has a normal mood and affect. Her behavior is normal. Judgment and thought content normal.    ED Course  Procedures (including critical care time) Labs Review Labs Reviewed  GLUCOSE, CAPILLARY - Abnormal; Notable for the following:    Glucose-Capillary 535 (*)    All other components within normal limits  COMPREHENSIVE METABOLIC PANEL - Abnormal; Notable for the following:    Potassium 3.4 (*)    Glucose, Bld 530 (*)    Alkaline Phosphatase 131 (*)    GFR calc non Af Amer 63 (*)    GFR calc Af Amer 74 (*)    All other components within normal limits  GLUCOSE, CAPILLARY - Abnormal; Notable for the following:    Glucose-Capillary 451 (*)    All other components within normal limits  CBC WITH DIFFERENTIAL - Abnormal; Notable for the following:    Hemoglobin 11.9 (*)    HCT 35.5 (*)    All other components within normal limits  URINALYSIS, ROUTINE W REFLEX MICROSCOPIC - Abnormal; Notable for the following:    APPearance CLOUDY (*)    Glucose, UA >1000 (*)    Hgb urine dipstick LARGE (*)     Protein, ur 100 (*)    All other components within normal limits  BASIC METABOLIC PANEL - Abnormal; Notable for the following:    Potassium 3.4 (*)    Glucose, Bld 327 (*)    GFR calc non Af Amer 74 (*)    GFR calc Af Amer 85 (*)    All other components within normal limits  URINE MICROSCOPIC-ADD ON - Abnormal; Notable for the following:    Crystals CA OXALATE CRYSTALS (*)    All other components within normal limits  CBG MONITORING, ED - Abnormal; Notable for the following:    Glucose-Capillary 352 (*)    All other components within normal limits  Randolm Idol, ED    Imaging Review Dg Chest 2 View  03/06/2014  CLINICAL DATA:  Asthma, diabetes.  EXAM: CHEST  2 VIEW  COMPARISON:  None.  FINDINGS: Normal cardiac contours. Tortuosity of the thoracic aorta. No consolidative pulmonary opacities. No pleural effusion or pneumothorax. Mid thoracic spine degenerative change.  IMPRESSION: No acute cardiopulmonary process.   Electronically Signed   By: Lovey Newcomer M.D.   On: 03/06/2014 16:32     EKG Interpretation None      MDM   Final diagnoses:  Type 2 diabetes mellitus with hyperglycemia  Hypokalemia  Essential hypertension   Patient is a 58 y.o. Female who [facility].  Physical exam is benign here at this time.  Patient was treated at [facility] with 1 L NS bolus and 10 units insulin.  At initial presentation the patient had CBG of 530.  Patient was mildly hypokalemia but this is likely secondary to hyperglycemia.  CBC showed no acute abnormalities at this time.  UA was negative for infection and ketones.  Anion gap was 14 here.  Given funny feeling in chest troponin and cxr were ordered and there were no acute abnormalities noted.  Patient's blood sugar was decreased to 327 here prior to discharge.  Doubt DKA or HONK.  Patient was seen by Case Management and have given her a match letter to help with medications.  I will prescribe  her home insulin, glucometer, strips, syringes, and refill her home blood pressure medications x 1 month.  Patient is to establish care at [facility].  Patient is stable for discharge at this time.  Patient was told to return to the ED with ACS symptoms, DKA symptoms, or any other concerning symptoms.  She states understanding and agreement at this time.  Patient was discussed with Dr. Wilson Singer who agrees with the above plan and workup.     Cherylann Parr, PA-C 03/07/14 [phone]

## 2014-03-06 NOTE — ED Notes (Addendum)
Pt from [place] and has run out of her insulin. [facility] She has an 18G Rt AC with NS Bolus.

## 2014-03-06 NOTE — ED Notes (Signed)
Bed: [id number] Expected date:  Expected time:  Means of arrival:  Comments: MAU transfer

## 2014-03-06 NOTE — Progress Notes (Signed)
[place],  Provided pt with a list of primary care resources to help pt establish a pcp. Patient just recently moved here from [place]. Pt has Medicaid in [place] which pt states could not be transferred to [facility]. Spoke with [facility] ED CM Amy, about providing patient with additional resources.

## 2014-03-06 NOTE — ED Notes (Signed)
CBG 352 on ED Glucometer.

## 2014-03-06 NOTE — MAU Provider Note (Signed)
History     CSN: [id number]  Arrival date and time: 03/06/14 1042   None     Chief Complaint  Patient presents with  . Medication Refill   HPI Pt is a 58 yo AA Female who presents with 2 days history of noncompliance with insulin medications and PMHx of Type 2 DM. Since pt's recent relocation to [facility] from [place] she has encountered trouble filling her insulin prescription at local pharmacies. Over this short period she spread out her insulin until she ran completely out 2 days ago. Since then she has progressively felt more and more fatigue and malaise. She also been experiencing polyuria but reports that she has increased her fluid intake to help combat dehydration. Pt tried to get help from social services but encountered more trouble getting her medications. After many attempts to resolve the issue the pt finally decided to come here today.   Past Medical History  Diagnosis Date  . Asthma   . Diabetes mellitus without complication   . Hypertension   . Heart murmur     History reviewed. No pertinent past surgical history.  History reviewed. No pertinent family history.  History  Substance Use Topics  . Smoking status: Former Research scientist (life sciences)  . Smokeless tobacco: Not on file  . Alcohol Use: No    Allergies: No Known Allergies  Prescriptions prior to admission  Medication Sig Dispense Refill  . amLODipine (NORVASC) 10 MG tablet Take 10 mg by mouth daily.      Marland Kitchen aspirin 81 MG tablet Take 81 mg by mouth daily.      Marland Kitchen atorvastatin (LIPITOR) 40 MG tablet Take 40 mg by mouth daily.      . fluticasone (FLONASE) 50 MCG/ACT nasal spray Place 1 spray into both nostrils 2 (two) times daily.      . furosemide (LASIX) 40 MG tablet Take 40 mg by mouth 2 (two) times daily.      . insulin NPH-regular Human (NOVOLIN 70/30) (70-30) 100 UNIT/ML injection Inject 20 Units into the skin 2 (two) times daily with a meal.      . lisinopril (PRINIVIL,ZESTRIL) 40 MG tablet Take 40 mg by mouth daily.          Review of Systems  Constitutional: Positive for malaise/fatigue. Negative for fever and chills.  Eyes: Negative for blurred vision.  Gastrointestinal: Negative for nausea, vomiting and abdominal pain.  Genitourinary: Positive for frequency.  Neurological: Positive for weakness. Negative for dizziness, loss of consciousness and headaches.   Physical Exam   Blood pressure 151/75, pulse 89, temperature 97.6 F (36.4 C), temperature source Oral, resp. rate 18, height 4\' 11"  (1.499 m), weight 102.967 kg (227 lb).  Physical Exam  Constitutional: She is oriented to person, place, and time. She appears well-developed and well-nourished.  HENT:  Head: Normocephalic and atraumatic.  Mouth/Throat:    Cardiovascular: Normal rate, regular rhythm and normal heart sounds.   Respiratory: Effort normal and breath sounds normal. No respiratory distress.  GI: Soft. She exhibits no distension and no mass. There is no tenderness. There is no guarding.  Neurological: She is alert and oriented to person, place, and time.  Skin: Skin is warm and dry.  Psychiatric: She has a normal mood and affect. Her behavior is normal.    MAU Course  Procedures  MDM Results for orders placed during the hospital encounter of 03/06/14 (from the past 24 hour(s))  GLUCOSE, CAPILLARY     Status: Abnormal   Collection Time  03/06/14 11:25 AM      Result Value Ref Range   Glucose-Capillary 535 (*) 70 - 99 mg/dL   Comment 1 Notify RN    COMPREHENSIVE METABOLIC PANEL     Status: Abnormal   Collection Time    03/06/14 11:48 AM      Result Value Ref Range   Sodium 139  137 - 147 mEq/L   Potassium 3.4 (*) 3.7 - 5.3 mEq/L   Chloride 98  96 - 112 mEq/L   CO2 27  19 - 32 mEq/L   Glucose, Bld 530 (*) 70 - 99 mg/dL   BUN 15  6 - 23 mg/dL   Creatinine, Ser 0.96  0.50 - 1.10 mg/dL   Calcium 8.8  8.4 - 10.5 mg/dL   Total Protein 7.9  6.0 - 8.3 g/dL   Albumin 3.6  3.5 - 5.2 g/dL   AST 14  0 - 37 U/L   ALT 19  0 -  35 U/L   Alkaline Phosphatase 131 (*) 39 - 117 U/L   Total Bilirubin 0.3  0.3 - 1.2 mg/dL   GFR calc non Af Amer 63 (*) >90 mL/min   GFR calc Af Amer 74 (*) >90 mL/min   Anion gap 14  5 - 15  GLUCOSE, CAPILLARY     Status: Abnormal   Collection Time    03/06/14 12:37 PM      Result Value Ref Range   Glucose-Capillary 451 (*) 70 - 99 mg/dL   Insulin (regular) 10 units given at [place] Rancour at [facility] re: recommendations. He gave recommended plan of care/testing vs transfer  Discussed with Dr Gala Romney who requests patient to be transferred to [facility] ED  IV started with NS for 1 liter  Assessment and Plan  A:  Hyperglycemia, with no acute evidence of DKA       Complex patient needing further evaluation and management.  P:  Transfer to [facility]       Discussed with patient who agrees to transfer.       Patient will need referral for primary care   Gabriel Cirri 03/06/2014, 11:39 AM

## 2014-03-06 NOTE — Progress Notes (Signed)
  CARE MANAGEMENT ED NOTE 03/06/2014  Patient:  NIHIRA, ROUTT   Account Number:  [id number]  Date Initiated:  03/06/2014  Documentation initiated by:  Livia Snellen  Subjective/Objective Assessment:   Patient presents to Ed with hyperglycemia     Subjective/Objective Assessment Detail:   Patient with pmhx of Asthma, DM, HTN, heart murmur.     Action/Plan:   Action/Plan Detail:   Anticipated DC Date:       Status Recommendation to Physician:   Result of Recommendation:    Other ED [place]  CM consult  Other  PCP issues  Medication Assistance  [place] Program    Choice offered to / List presented to:            Status of service:  Completed, signed off  ED Comments:   ED Comments Detail:  [facility] consulted by [facility] rep Erline Levine requesting assistance with patient's medications.  EDCM spke to patient at bedside.  Patient reports she has just move to [facility] from [place] and has ran out of her insulin. Patient specifically takes Humulin 70/30, patient cannot take novalog.  Patient is without a pcp.  [facility] provided patient with pamphlet to [facility].  EDCM explained to patient that at [facility] she may establish care, receive assistance with her medications, speak to a Scientist, research (life sciences) if needed.  Patient reports she has Medicaid insurance in [place] and has called DSS in [place] but was unable to transfer her Medicaid.  EDCM provided patient with phone number to DSS of [employer].  [facility] received permission by patient to make an appointment with [facility] and to call [facility] pharmacy in regards to her diabetic supplies. EDCM called [facility] at 1749pm and left voice message requesting appointment for patient.  EDCM also emailed Prudencio Pair in attempts to make patient an appointment. EDCM called [place] pharmacy at 1751pm and spoke to [place] who reports the patient may bring her prescriptions tomorrow anytime between 9am and 6pm.  [facility]  informed patient that when she is at [facility] tomorrow to tell them she was just discharged from the [facility] emergency and would like to establish care.  Patient verbalized understanding.  EDCM discussed patient with EDPA who will write RX for true test diabetic supplies, syringes, insulin and refills for patient's blood pressure medications.  EDCM placed patient into the [facility] program.  EDCM explianed to patient that this is a one time service, and that she would not be eligible for this service until 08/16.  EDCM explained the copay of three dollars for her medications through the [facility] program.  Patient is agreeable to copay.  Instructed patient to take prescriptions and [place] letter to participating pharmacy listed on list provided to patient. Patient verbalized understanding and thankful for services. No further EDCM needs at this time.

## 2014-03-06 NOTE — MAU Note (Signed)
Patient states she is moving from [place] and has ran out of her insulin and hasn't taken for 2 days. Has been trying to get assistance to get her medication but has been unable to. State she is starting to feel bad.

## 2014-03-07 NOTE — ED Provider Notes (Signed)
Medical screening examination/treatment/procedure(s) were performed by non-physician practitioner and as supervising physician I was immediately available for consultation/collaboration.   EKG Interpretation None       Virgel Manifold, MD 03/07/14 [phone]

## 2014-03-07 NOTE — Progress Notes (Signed)
03/07/2014 A. Leovanni Bjorkman RNCM 1533pm EDCM caled patirnt fo follow up.  Patient reports she was able to get all of her prescriptions filled and was able to go to the [facility] to get all of her diabetic supplies.  [facility] asked patient if she was able to make an appointment?  Patient responded, "No I just got my supplies.  I'm going to call them on Monday."  [facility] reminded patient that walk ins are welcome Mon-thurs from 9am-1030am at [facility].  Patient confirms she has the phone number for the [facility].  Patient very thankful for services.  No further EDCM needs at this time.

## 2014-03-10 ENCOUNTER — Ambulatory Visit: Payer: Medicaid - Out of State | Attending: Internal Medicine | Admitting: Internal Medicine

## 2014-03-10 ENCOUNTER — Encounter: Payer: Self-pay | Admitting: Internal Medicine

## 2014-03-10 VITALS — BP 129/86 | HR 83 | Temp 98.4°F | Resp 16 | Ht 60.0 in | Wt 233.2 lb

## 2014-03-10 DIAGNOSIS — Z794 Long term (current) use of insulin: Secondary | ICD-10-CM | POA: Insufficient documentation

## 2014-03-10 DIAGNOSIS — E785 Hyperlipidemia, unspecified: Secondary | ICD-10-CM | POA: Insufficient documentation

## 2014-03-10 DIAGNOSIS — I10 Essential (primary) hypertension: Secondary | ICD-10-CM | POA: Insufficient documentation

## 2014-03-10 DIAGNOSIS — J45909 Unspecified asthma, uncomplicated: Secondary | ICD-10-CM | POA: Insufficient documentation

## 2014-03-10 DIAGNOSIS — J302 Other seasonal allergic rhinitis: Secondary | ICD-10-CM

## 2014-03-10 DIAGNOSIS — Z823 Family history of stroke: Secondary | ICD-10-CM | POA: Insufficient documentation

## 2014-03-10 DIAGNOSIS — R011 Cardiac murmur, unspecified: Secondary | ICD-10-CM | POA: Insufficient documentation

## 2014-03-10 DIAGNOSIS — Z833 Family history of diabetes mellitus: Secondary | ICD-10-CM | POA: Insufficient documentation

## 2014-03-10 DIAGNOSIS — Z7982 Long term (current) use of aspirin: Secondary | ICD-10-CM | POA: Insufficient documentation

## 2014-03-10 DIAGNOSIS — J309 Allergic rhinitis, unspecified: Secondary | ICD-10-CM

## 2014-03-10 DIAGNOSIS — E119 Type 2 diabetes mellitus without complications: Secondary | ICD-10-CM | POA: Insufficient documentation

## 2014-03-10 DIAGNOSIS — E1165 Type 2 diabetes mellitus with hyperglycemia: Secondary | ICD-10-CM

## 2014-03-10 DIAGNOSIS — Z8249 Family history of ischemic heart disease and other diseases of the circulatory system: Secondary | ICD-10-CM | POA: Insufficient documentation

## 2014-03-10 LAB — GLUCOSE, POCT (MANUAL RESULT ENTRY): POC Glucose: 177 mg/dL — AB (ref 70–99)

## 2014-03-10 LAB — POCT GLYCOSYLATED HEMOGLOBIN (HGB A1C): Hemoglobin A1C: 10.9

## 2014-03-10 MED ORDER — FLUTICASONE PROPIONATE 50 MCG/ACT NA SUSP: 2.0000 | Freq: Every day | NASAL | Status: DC

## 2014-03-10 MED ORDER — ATORVASTATIN CALCIUM 40 MG PO TABS: 40.0000 mg | ORAL_TABLET | Freq: Every day | ORAL | Status: DC

## 2014-03-10 NOTE — Progress Notes (Signed)
Pt is here to establish care. Pt has a history of diabetes, HTN and asthma.

## 2014-03-10 NOTE — Progress Notes (Signed)
03/10/2014 [name] 1540pm EDCM received phone call from patient requesting assistance with appointment at [facility].  EDCM spoke to [facility] at [facility] who reports patient has been scheduled an appointment at [facility] for today at 1530pm.  [facility] called patient back at phone number [phone].  Patient reports she is currently at [facility] for appointment.  EDCM made Monique at [facility] aware.  No further EDCM needs at this time.

## 2014-03-10 NOTE — Progress Notes (Signed)
Patient ID: Vanessa [name], female   DOB: 19-May-1956, 58 y.o.   MRN: [id number]  EPP:[id number]  SAY:[id number]  DOB - 11/29/1955  CC:  Chief Complaint  Patient presents with  . Establish Care       HPI: Vanessa [name] is a 58 y.o. female here today to establish medical care.  Patient was seen in the ER for hyperglycemia.  Patient reports that before moving here from [place] last month she did not have medication refills so she was using her insulin very sparingly in order to make it last.  Patient reports that she was out of her anti-hypertensive medication as well.  Patient denies history of CVA or MI.  She reports that her CBG has been reading in the high 200's to 300's.  Patient reports that she has been on Metformin but believes it was making her sick, so she discontinued use.  Patient reports that she continues to have cycles.  She states that she has only been without a cycle for 3 months and they have been present every since then.  She reports that they are usually heavy in flow, denies painful menstruation.   No Known Allergies Past Medical History  Diagnosis Date  . Asthma   . Diabetes mellitus without complication   . Hypertension   . Heart murmur    Current Outpatient Prescriptions on File Prior to Visit  Medication Sig Dispense Refill  . amLODipine (NORVASC) 10 MG tablet Take 10 mg by mouth daily.      Marland Kitchen amLODipine (NORVASC) 10 MG tablet Take 1 tablet (10 mg total) by mouth daily.  30 tablet  0  . aspirin 81 MG tablet Take 81 mg by mouth daily.      Marland Kitchen atorvastatin (LIPITOR) 40 MG tablet Take 40 mg by mouth daily.      . Blood Glucose Monitoring Suppl (TRUERESULT BLOOD GLUCOSE) W/DEVICE KIT Dispense 1 kit  1 each  0  . fluticasone (FLONASE) 50 MCG/ACT nasal spray Place 1 spray into both nostrils 2 (two) times daily.      . furosemide (LASIX) 40 MG tablet Take 40 mg by mouth 2 (two) times daily.      . furosemide (LASIX) 40 MG tablet Take 1 tablet (40 mg total) by mouth 2  (two) times daily.  60 tablet  0  . glucose blood test strip Use as instructed  100 each  12  . insulin NPH-regular Human (NOVOLIN 70/30) (70-30) 100 UNIT/ML injection Inject 20 Units into the skin 2 (two) times daily with a meal.      . insulin NPH-regular Human (NOVOLIN 70/30) (70-30) 100 UNIT/ML injection Inject 20 Units into the skin 2 (two) times daily with a meal.  10 mL  11  . INSULIN SYRINGE 1CC/29G 29G X 1/2" 1 ML MISC Insulin syringes  100 each  0  . lisinopril (PRINIVIL,ZESTRIL) 20 MG tablet Take 2 tablets (40 mg total) by mouth daily.  30 tablet  0  . lisinopril (PRINIVIL,ZESTRIL) 40 MG tablet Take 40 mg by mouth daily.      . TRUEPLUS LANCETS 28G MISC Dispense 100 count lancets  100 each  0   No current facility-administered medications on file prior to visit.   Family History  Problem Relation Age of Onset  . Stroke Mother   . Diabetes Mother   . Hypertension Mother   . Diabetes Brother   . Hypertension Brother    History   Social History  . Marital Status: Married  Spouse Name: N/A    Number of Children: N/A  . Years of Education: N/A   Occupational History  . Not on file.   Social History Main Topics  . Smoking status: Never Smoker   . Smokeless tobacco: Not on file  . Alcohol Use: No  . Drug Use: No  . Sexual Activity: No   Other Topics Concern  . Not on file   Social History Narrative  . No narrative on file   Review of Systems  Eyes: Positive for blurred vision.  Respiratory: Positive for cough.   Cardiovascular: Negative.   Gastrointestinal: Positive for constipation. Negative for nausea, vomiting, abdominal pain and diarrhea.  Genitourinary: Positive for frequency.  Musculoskeletal: Negative.   Neurological: Positive for weakness. Negative for dizziness, tingling and headaches.  Endo/Heme/Allergies: Positive for polydipsia.  Psychiatric/Behavioral: Negative.       Objective:   Filed Vitals:   03/10/14 1526  BP: 129/86  Pulse: 83    Temp: 98.4 F (36.9 C)  Resp: 16    Physical Exam: Constitutional: Patient appears well-developed and well-nourished. No distress. HENT: Normocephalic, atraumatic, External right and left ear normal. Oropharynx is clear and moist.  Eyes: Conjunctivae and EOM are normal. PERRLA, no scleral icterus. Neck: Normal ROM. Neck supple. No JVD. No tracheal deviation. No thyromegaly. CVS: RRR, S1/S2 +, no murmurs, no gallops, no carotid bruit.  Pulmonary: Effort and breath sounds normal, no stridor, rhonchi, wheezes, rales.  Abdominal: Soft. BS +, no distension, tenderness, rebound or guarding.  Musculoskeletal: Normal range of motion. No edema and no tenderness.  Lymphadenopathy: No lymphadenopathy noted, cervical, inguinal or axillary Neuro: Alert. Normal reflexes, muscle tone coordination. No cranial nerve deficit. Skin: Skin is warm and dry. No rash noted. Not diaphoretic. No erythema. No pallor. Psychiatric: Normal mood and affect. Behavior, judgment, thought content normal.  Lab Results  Component Value Date   WBC 8.0 03/06/2014   HGB 11.9* 03/06/2014   HCT 35.5* 03/06/2014   MCV 85.5 03/06/2014   PLT 308 03/06/2014   Lab Results  Component Value Date   CREATININE 0.85 03/06/2014   BUN 13 03/06/2014   NA 143 03/06/2014   K 3.4* 03/06/2014   CL 104 03/06/2014   CO2 25 03/06/2014    Lab Results  Component Value Date   HGBA1C 10.9 03/10/2014   Lipid Panel  No results found for this basename: chol, trig, hdl, cholhdl, vldl, ldlcalc       Assessment and plan:   Vanessa [name] was seen today for establish care.  Diagnoses and associated orders for this visit:  Type 2 diabetes mellitus with hyperglycemia - Glucose (CBG) - HgB A1c - Amb Referral to Nutrition and Diabetic E - MM Digital Screening; Future - Lipid panel; Future - CBC; Future - COMPLETE METABOLIC PANEL WITH GFR; Future - TSH; Future  Dyslipidemia - atorvastatin (LIPITOR) 40 MG tablet; Take 1 tablet (40 mg total) by mouth  daily.  Seasonal allergies - fluticasone (FLONASE) 50 MCG/ACT nasal spray; Place 2 sprays into both nostrils daily.     Return in about 1 week (around 03/17/2014) for lab visit and Nurse Visit-CBG check, 3 mo PCP DM and pap.     Chari Manning, NP-C [facility] [phone] 03/10/2014, 3:55 PM\

## 2014-03-10 NOTE — Patient Instructions (Signed)
DASH Eating Plan °DASH stands for "Dietary Approaches to Stop Hypertension." The DASH eating plan is a healthy eating plan that has been shown to reduce high blood pressure (hypertension). Additional health benefits may include reducing the risk of type 2 diabetes mellitus, heart disease, and stroke. The DASH eating plan may also help with weight loss. °WHAT DO I NEED TO KNOW ABOUT THE DASH EATING PLAN? °For the DASH eating plan, you will follow these general guidelines: °· Choose foods with a percent daily value for sodium of less than 5% (as listed on the food label). °· Use salt-free seasonings or herbs instead of table salt or sea salt. °· Check with your health care provider or pharmacist before using salt substitutes. °· Eat lower-sodium products, often labeled as "lower sodium" or "no salt added." °· Eat fresh foods. °· Eat more vegetables, fruits, and low-fat dairy products. °· Choose whole grains. Look for the word "whole" as the first word in the ingredient list. °· Choose fish and skinless chicken or turkey more often than red meat. Limit fish, poultry, and meat to 6 oz (170 g) each day. °· Limit sweets, desserts, sugars, and sugary drinks. °· Choose heart-healthy fats. °· Limit cheese to 1 oz (28 g) per day. °· Eat more home-cooked food and less restaurant, buffet, and fast food. °· Limit fried foods. °· Cook foods using methods other than frying. °· Limit canned vegetables. If you do use them, rinse them well to decrease the sodium. °· When eating at a restaurant, ask that your food be prepared with less salt, or no salt if possible. °WHAT FOODS CAN I EAT? °Seek help from a dietitian for individual calorie needs. °Grains °Whole grain or whole wheat bread. Brown rice. Whole grain or whole wheat pasta. Quinoa, bulgur, and whole grain cereals. Low-sodium cereals. Corn or whole wheat flour tortillas. Whole grain cornbread. Whole grain crackers. Low-sodium crackers. °Vegetables °Fresh or frozen vegetables  (raw, steamed, roasted, or grilled). Low-sodium or reduced-sodium tomato and vegetable juices. Low-sodium or reduced-sodium tomato sauce and paste. Low-sodium or reduced-sodium canned vegetables.  °Fruits °All fresh, canned (in natural juice), or frozen fruits. °Meat and Other Protein Products °Ground beef (85% or leaner), grass-fed beef, or beef trimmed of fat. Skinless chicken or turkey. Ground chicken or turkey. Pork trimmed of fat. All fish and seafood. Eggs. Dried beans, peas, or lentils. Unsalted nuts and seeds. Unsalted canned beans. °Dairy °Low-fat dairy products, such as skim or 1% milk, 2% or reduced-fat cheeses, low-fat ricotta or cottage cheese, or plain low-fat yogurt. Low-sodium or reduced-sodium cheeses. °Fats and Oils °Tub margarines without trans fats. Light or reduced-fat mayonnaise and salad dressings (reduced sodium). Avocado. Safflower, olive, or canola oils. Natural peanut or almond butter. °Other °Unsalted popcorn and pretzels. °The items listed above may not be a complete list of recommended foods or beverages. Contact your dietitian for more options. °WHAT FOODS ARE NOT RECOMMENDED? °Grains °White bread. White pasta. White rice. Refined cornbread. Bagels and croissants. Crackers that contain trans fat. °Vegetables °Creamed or fried vegetables. Vegetables in a cheese sauce. Regular canned vegetables. Regular canned tomato sauce and paste. Regular tomato and vegetable juices. °Fruits °Dried fruits. Canned fruit in light or heavy syrup. Fruit juice. °Meat and Other Protein Products °Fatty cuts of meat. Ribs, chicken wings, bacon, sausage, bologna, salami, chitterlings, fatback, hot dogs, bratwurst, and packaged luncheon meats. Salted nuts and seeds. Canned beans with salt. °Dairy °Whole or 2% milk, cream, half-and-half, and cream cheese. Whole-fat or sweetened yogurt. Full-fat   cheeses or blue cheese. Nondairy creamers and whipped toppings. Processed cheese, cheese spreads, or cheese  curds. °Condiments °Onion and garlic salt, seasoned salt, table salt, and sea salt. Canned and packaged gravies. Worcestershire sauce. Tartar sauce. Barbecue sauce. Teriyaki sauce. Soy sauce, including reduced sodium. Steak sauce. Fish sauce. Oyster sauce. Cocktail sauce. Horseradish. Ketchup and mustard. Meat flavorings and tenderizers. Bouillon cubes. Hot sauce. Tabasco sauce. Marinades. Taco seasonings. Relishes. °Fats and Oils °Butter, stick margarine, lard, shortening, ghee, and bacon fat. Coconut, palm kernel, or palm oils. Regular salad dressings. °Other °Pickles and olives. Salted popcorn and pretzels. °The items listed above may not be a complete list of foods and beverages to avoid. Contact your dietitian for more information. °WHERE CAN I FIND MORE INFORMATION? °National Heart, [employer], and [employer] Institute: www.nhlbi.nih.gov/health/health-topics/topics/dash/ °Document Released: 06/16/2011 Document Revised: 11/11/2013 Document Reviewed: 05/01/2013 °ExitCare® Patient Information ©2015 [employer]. This information is not intended to replace advice given to you by your health care provider. Make sure you discuss any questions you have with your health care provider. ° °

## 2014-03-19 ENCOUNTER — Ambulatory Visit: Payer: Medicaid - Out of State | Attending: Internal Medicine | Admitting: *Deleted

## 2014-03-19 DIAGNOSIS — Z794 Long term (current) use of insulin: Secondary | ICD-10-CM | POA: Insufficient documentation

## 2014-03-19 DIAGNOSIS — E119 Type 2 diabetes mellitus without complications: Secondary | ICD-10-CM | POA: Insufficient documentation

## 2014-03-19 LAB — GLUCOSE, POCT (MANUAL RESULT ENTRY): POC Glucose: 181 mg/dl — AB (ref 70–99)

## 2014-03-19 NOTE — Progress Notes (Signed)
Patient ID: Vanessa [name], female   DOB: 10/25/1955, 58 y.o.   MRN: [id number] Patient presents today for a repeat CBG check. CBG today is 181. Patient states that she takes ins ulin 2 times a day. 20 units in AM and 20 units in PM and checks sugars 4 times a day. Log only shows her checking sugars 1 time a day. Spoke with her about keeping her sugar log up and witting down ALL readings. Spoke with Dr. Feliciana Rossetti and increased insulin to 25 units 2 times a day. And made an appointment for 2 weeks for a repeat CBG. Patient was consuled on this and given AVS with instructions on it.

## 2014-03-19 NOTE — Patient Instructions (Signed)
Take Insulin 2 times a day.   Go up to 25 units in AM and 25 units in PM.  Keep up your sugar log up to date and bring with you to next visit.  Come back in 2 weeks for a repeat CBG.

## 2014-04-02 ENCOUNTER — Ambulatory Visit: Payer: Medicaid - Out of State | Attending: Internal Medicine

## 2014-04-02 DIAGNOSIS — E1165 Type 2 diabetes mellitus with hyperglycemia: Principal | ICD-10-CM

## 2014-04-02 DIAGNOSIS — IMO0001 Reserved for inherently not codable concepts without codable children: Secondary | ICD-10-CM

## 2014-04-02 LAB — GLUCOSE, POCT (MANUAL RESULT ENTRY): POC Glucose: 213 mg/dl — AB (ref 70–99)

## 2014-04-02 MED ORDER — AMLODIPINE BESYLATE 10 MG PO TABS: 10.0000 mg | ORAL_TABLET | Freq: Every day | ORAL | Status: DC

## 2014-04-02 MED ORDER — INSULIN NPH ISOPHANE & REGULAR (70-30) 100 UNIT/ML ~~LOC~~ SUSP: 23.0000 [IU] | Freq: Two times a day (BID) | SUBCUTANEOUS | Status: DC

## 2014-04-02 MED ORDER — FUROSEMIDE 40 MG PO TABS: 40.0000 mg | ORAL_TABLET | Freq: Two times a day (BID) | ORAL | Status: DC

## 2014-04-02 MED ORDER — LISINOPRIL 20 MG PO TABS: 40.0000 mg | ORAL_TABLET | Freq: Every day | ORAL | Status: DC

## 2014-04-02 NOTE — Patient Instructions (Signed)
Insulin increased to 23 units twice a day Place a star when taking insulin shots and record in sugar log Medication reordered and sent to [facility] Return in 2 weeks for nurse visit with blood sugar log

## 2014-04-03 ENCOUNTER — Encounter: Payer: Medicaid - Out of State | Attending: Internal Medicine

## 2014-04-03 DIAGNOSIS — Z794 Long term (current) use of insulin: Secondary | ICD-10-CM | POA: Insufficient documentation

## 2014-04-03 DIAGNOSIS — Z713 Dietary counseling and surveillance: Secondary | ICD-10-CM | POA: Insufficient documentation

## 2014-04-03 DIAGNOSIS — E119 Type 2 diabetes mellitus without complications: Secondary | ICD-10-CM | POA: Insufficient documentation

## 2014-04-06 NOTE — Progress Notes (Signed)
Patient was seen on 04/03/14 for the first of a series of three diabetes self-management courses at [facility]  Patient Education Plan per assessed needs and concerns is to attend four course education program for Diabetes Self Management Education.  The following learning objectives were met by the patient during this class:  Describe diabetes  State some common risk factors for diabetes  Defines the role of glucose and insulin  Identifies type of diabetes and pathophysiology  Describe the relationship between diabetes and cardiovascular risk  State the members of the Healthcare Team  States the rationale for glucose monitoring  State when to test glucose  State their individual Target Range  State the importance of logging glucose readings  Describe how to interpret glucose readings  Identifies A1C target  Explain the correlation between A1c and eAG values  State symptoms and treatment of high blood glucose  State symptoms and treatment of low blood glucose  Explain proper technique for glucose testing  Identifies proper sharps disposal  Handouts given during class include:  Living Well with Diabetes book  Carb Counting and Meal Planning book  Meal Plan Card  Carbohydrate guide  Meal planning worksheet  Low Sodium Flavoring Tips  The diabetes portion plate  Z1Q to eAG Conversion Chart  Diabetes Medications  Diabetes Recommended Care Schedule  Support Group  Diabetes Success Plan  Core Class Satisfaction Survey  Follow-Up Plan:  Attend core 2

## 2014-04-10 ENCOUNTER — Ambulatory Visit: Payer: Medicaid - Out of State

## 2014-04-15 ENCOUNTER — Encounter: Payer: Self-pay | Attending: Internal Medicine

## 2014-04-15 DIAGNOSIS — Z713 Dietary counseling and surveillance: Secondary | ICD-10-CM | POA: Insufficient documentation

## 2014-04-15 DIAGNOSIS — Z794 Long term (current) use of insulin: Secondary | ICD-10-CM | POA: Insufficient documentation

## 2014-04-15 DIAGNOSIS — E119 Type 2 diabetes mellitus without complications: Secondary | ICD-10-CM | POA: Insufficient documentation

## 2014-04-15 NOTE — Progress Notes (Signed)
Patient was seen on 04/15/2014 for the second of a series of three diabetes self-management courses at [facility]. The following learning objectives were met by the patient during this class:   Describe the role of different macronutrients on glucose  Explain how carbohydrates affect blood glucose  State what foods contain the most carbohydrates  Demonstrate carbohydrate counting  Demonstrate how to read Nutrition Facts food label  Describe effects of various fats on heart health  Describe the importance of good nutrition for health and healthy eating strategies  Describe techniques for managing your shopping, cooking and meal planning  List strategies to follow meal plan when dining out  Describe the effects of alcohol on glucose and how to use it safely  Goals:  Follow Diabetes Meal Plan as instructed  Eat 3 meals and 2 snacks, every 3-5 hrs  Aim for 45 grams carbohydrate/meal Aim for 15 grams carbohydrate/snack Add lean protein foods to meals/snacks  Monitor glucose levels as instructed by your doctor   Follow-Up Plan:  Attend Core 3  Work towards following your personal food plan.   

## 2014-04-17 ENCOUNTER — Ambulatory Visit: Payer: Medicaid - Out of State

## 2014-04-18 ENCOUNTER — Ambulatory Visit: Payer: Medicaid - Out of State | Attending: Internal Medicine

## 2014-04-18 DIAGNOSIS — Z794 Long term (current) use of insulin: Secondary | ICD-10-CM | POA: Insufficient documentation

## 2014-04-18 DIAGNOSIS — E119 Type 2 diabetes mellitus without complications: Secondary | ICD-10-CM | POA: Insufficient documentation

## 2014-04-18 LAB — GLUCOSE, POCT (MANUAL RESULT ENTRY): POC Glucose: 360 mg/dl — AB (ref 70–99)

## 2014-04-18 MED ORDER — GLIPIZIDE ER 5 MG PO TB24: 5.0000 mg | ORAL_TABLET | Freq: Every day | ORAL | Status: DC

## 2014-04-18 NOTE — Patient Instructions (Signed)
Take Glucotrol XL 5 mg tablet to control blood sugar long lasting  Continue taking 30 units Novolin 70/30  Twice/day Monitor blood sugar with diet planning/exercising Continue going to Nutrition classes

## 2014-04-18 NOTE — Progress Notes (Signed)
Pt comes in today for blood sugar recheck with medication adjustment of Insulin per last visit 03/10/2014 Pt admits she doesn't eat healthy foods with taking insulin Pt is currently injection 30 units Novolin 70/30 BID with uncontrolled numbers according to log Ranging 160's-180's fasting When asked pt what she ate for breakfast. She responded, steak biscuit. Diabetes materials and education done Pt is being seen by Nutrition nurse Izora Gala. CBG-360 pt injected 30 units before visit Spoke with Valerie,NP for instructions

## 2014-04-22 DIAGNOSIS — E119 Type 2 diabetes mellitus without complications: Secondary | ICD-10-CM

## 2014-04-22 NOTE — Progress Notes (Signed)
Patient was seen on 04/22/14 for the third of a series of three diabetes self-management courses at the [facility]. The following learning objectives were met by the patient during this class:    State the amount of activity recommended for healthy living   Describe activities suitable for individual needs   Identify ways to regularly incorporate activity into daily life   Identify barriers to activity and ways to over come these barriers  Identify diabetes medications being personally used and their primary action for lowering glucose and possible side effects   Describe role of stress on blood glucose and develop strategies to address psychosocial issues   Identify diabetes complications and ways to prevent them  Explain how to manage diabetes during illness   Evaluate success in meeting personal goal   Establish 2-3 goals that they will plan to diligently work on until they return for the  24-monthfollow-up visit  Goals:   I will be active more each week  I will test my glucose at least 1 times a day, several days a week  Your patient has identified these potential barriers to change:  Finances Stress  Your patient has identified their diabetes self-care support plan as  Doctor BHarvie Bridge RD, CDE Plan:  Attend Core 4 in 4 months

## 2014-05-07 ENCOUNTER — Other Ambulatory Visit: Payer: Self-pay | Admitting: Internal Medicine

## 2014-05-09 ENCOUNTER — Telehealth: Payer: Self-pay | Admitting: Internal Medicine

## 2014-05-09 NOTE — Telephone Encounter (Signed)
Pt called and left a vm requesting that a nurse give her a call back.

## 2014-05-12 ENCOUNTER — Telehealth: Payer: Self-pay | Admitting: Emergency Medicine

## 2014-05-12 ENCOUNTER — Other Ambulatory Visit: Payer: Self-pay | Admitting: Emergency Medicine

## 2014-05-12 MED ORDER — INSULIN NPH ISOPHANE & REGULAR (70-30) 100 UNIT/ML ~~LOC~~ SUSP: 23.0000 [IU] | Freq: Two times a day (BID) | SUBCUTANEOUS | Status: DC

## 2014-05-12 NOTE — Telephone Encounter (Signed)
Left VM for pt to call when message received

## 2014-05-23 ENCOUNTER — Other Ambulatory Visit: Payer: Self-pay

## 2014-05-23 ENCOUNTER — Encounter: Payer: Self-pay | Admitting: Internal Medicine

## 2014-05-23 ENCOUNTER — Ambulatory Visit: Payer: Medicaid - Out of State | Admitting: Internal Medicine

## 2014-05-23 ENCOUNTER — Ambulatory Visit: Payer: Medicaid - Out of State | Attending: Internal Medicine | Admitting: Internal Medicine

## 2014-05-23 ENCOUNTER — Ambulatory Visit (HOSPITAL_COMMUNITY)
Admission: RE | Admit: 2014-05-23 | Discharge: 2014-05-23 | Disposition: A | Discharge status: Home / Self Care | Payer: Medicaid - Out of State | Source: Ambulatory Visit | Attending: Internal Medicine | Admitting: Internal Medicine

## 2014-05-23 VITALS — BP 132/84 | HR 71 | Temp 98.5°F | Resp 16 | Ht 60.0 in | Wt 236.0 lb

## 2014-05-23 DIAGNOSIS — R799 Abnormal finding of blood chemistry, unspecified: Secondary | ICD-10-CM

## 2014-05-23 DIAGNOSIS — Z794 Long term (current) use of insulin: Secondary | ICD-10-CM | POA: Insufficient documentation

## 2014-05-23 DIAGNOSIS — Z7982 Long term (current) use of aspirin: Secondary | ICD-10-CM | POA: Insufficient documentation

## 2014-05-23 DIAGNOSIS — I10 Essential (primary) hypertension: Secondary | ICD-10-CM

## 2014-05-23 DIAGNOSIS — R0789 Other chest pain: Secondary | ICD-10-CM | POA: Diagnosis not present

## 2014-05-23 DIAGNOSIS — E119 Type 2 diabetes mellitus without complications: Secondary | ICD-10-CM

## 2014-05-23 DIAGNOSIS — N920 Excessive and frequent menstruation with regular cycle: Secondary | ICD-10-CM | POA: Insufficient documentation

## 2014-05-23 DIAGNOSIS — E785 Hyperlipidemia, unspecified: Secondary | ICD-10-CM

## 2014-05-23 LAB — COMPLETE METABOLIC PANEL WITH GFR
ALBUMIN: 3.7 g/dL (ref 3.5–5.2)
ALT: 12 U/L (ref 0–35)
AST: 12 U/L (ref 0–37)
Alkaline Phosphatase: 79 U/L (ref 39–117)
BUN: 14 mg/dL (ref 6–23)
CO2: 25 mEq/L (ref 19–32)
Calcium: 8.7 mg/dL (ref 8.4–10.5)
Chloride: 105 mEq/L (ref 96–112)
Creat: 0.98 mg/dL (ref 0.50–1.10)
GFR, EST AFRICAN AMERICAN: 73 mL/min
GFR, Est Non African American: 63 mL/min
Glucose, Bld: 154 mg/dL — ABNORMAL HIGH (ref 70–99)
Potassium: 4 mEq/L (ref 3.5–5.3)
SODIUM: 144 meq/L (ref 135–145)
TOTAL PROTEIN: 7.6 g/dL (ref 6.0–8.3)
Total Bilirubin: 0.3 mg/dL (ref 0.2–1.2)

## 2014-05-23 LAB — POCT GLYCOSYLATED HEMOGLOBIN (HGB A1C): Hemoglobin A1C: 8.7

## 2014-05-23 LAB — GLUCOSE, POCT (MANUAL RESULT ENTRY): POC GLUCOSE: 161 mg/dL — AB (ref 70–99)

## 2014-05-23 MED ORDER — ATORVASTATIN CALCIUM 40 MG PO TABS: 40.0000 mg | ORAL_TABLET | Freq: Every day | ORAL | Status: DC

## 2014-05-23 MED ORDER — INSULIN NPH ISOPHANE & REGULAR (70-30) 100 UNIT/ML ~~LOC~~ SUSP: 25.0000 [IU] | Freq: Two times a day (BID) | SUBCUTANEOUS | Status: DC

## 2014-05-23 MED ORDER — LISINOPRIL 20 MG PO TABS: 40.0000 mg | ORAL_TABLET | Freq: Every day | ORAL | Status: DC

## 2014-05-23 MED ORDER — GLIPIZIDE ER 5 MG PO TB24: 5.0000 mg | ORAL_TABLET | Freq: Every day | ORAL | Status: DC

## 2014-05-23 MED ORDER — ASPIRIN 81 MG PO TABS: 81.0000 mg | ORAL_TABLET | Freq: Every day | ORAL | Status: DC

## 2014-05-23 MED ORDER — FUROSEMIDE 40 MG PO TABS: 40.0000 mg | ORAL_TABLET | Freq: Two times a day (BID) | ORAL | Status: DC

## 2014-05-23 MED ORDER — INSULIN NPH ISOPHANE & REGULAR (70-30) 100 UNIT/ML ~~LOC~~ SUSP: 23.0000 [IU] | Freq: Two times a day (BID) | SUBCUTANEOUS | Status: DC

## 2014-05-23 MED ORDER — AMLODIPINE BESYLATE 10 MG PO TABS: 10.0000 mg | ORAL_TABLET | Freq: Every day | ORAL | Status: DC

## 2014-05-23 NOTE — Progress Notes (Signed)
Pt is here following up on her diabetes, HTN, and her hyperlipidemia. Pt has questions for her fluid pill. Pt states that she has been having chest pains. Pt reports that she is having excess bleeding during her menstrual cycles.

## 2014-05-23 NOTE — Patient Instructions (Signed)
DASH Eating Plan °DASH stands for "Dietary Approaches to Stop Hypertension." The DASH eating plan is a healthy eating plan that has been shown to reduce high blood pressure (hypertension). Additional health benefits may include reducing the risk of type 2 diabetes mellitus, heart disease, and stroke. The DASH eating plan may also help with weight loss. °WHAT DO I NEED TO KNOW ABOUT THE DASH EATING PLAN? °For the DASH eating plan, you will follow these general guidelines: °· Choose foods with a percent daily value for sodium of less than 5% (as listed on the food label). °· Use salt-free seasonings or herbs instead of table salt or sea salt. °· Check with your health care provider or pharmacist before using salt substitutes. °· Eat lower-sodium products, often labeled as "lower sodium" or "no salt added." °· Eat fresh foods. °· Eat more vegetables, fruits, and low-fat dairy products. °· Choose whole grains. Look for the word "whole" as the first word in the ingredient list. °· Choose fish and skinless chicken or turkey more often than red meat. Limit fish, poultry, and meat to 6 oz (170 g) each day. °· Limit sweets, desserts, sugars, and sugary drinks. °· Choose heart-healthy fats. °· Limit cheese to 1 oz (28 g) per day. °· Eat more home-cooked food and less restaurant, buffet, and fast food. °· Limit fried foods. °· Cook foods using methods other than frying. °· Limit canned vegetables. If you do use them, rinse them well to decrease the sodium. °· When eating at a restaurant, ask that your food be prepared with less salt, or no salt if possible. °WHAT FOODS CAN I EAT? °Seek help from a dietitian for individual calorie needs. °Grains °Whole grain or whole wheat bread. Brown rice. Whole grain or whole wheat pasta. Quinoa, bulgur, and whole grain cereals. Low-sodium cereals. Corn or whole wheat flour tortillas. Whole grain cornbread. Whole grain crackers. Low-sodium crackers. °Vegetables °Fresh or frozen vegetables  (raw, steamed, roasted, or grilled). Low-sodium or reduced-sodium tomato and vegetable juices. Low-sodium or reduced-sodium tomato sauce and paste. Low-sodium or reduced-sodium canned vegetables.  °Fruits °All fresh, canned (in natural juice), or frozen fruits. °Meat and Other Protein Products °Ground beef (85% or leaner), grass-fed beef, or beef trimmed of fat. Skinless chicken or turkey. Ground chicken or turkey. Pork trimmed of fat. All fish and seafood. Eggs. Dried beans, peas, or lentils. Unsalted nuts and seeds. Unsalted canned beans. °Dairy °Low-fat dairy products, such as skim or 1% milk, 2% or reduced-fat cheeses, low-fat ricotta or cottage cheese, or plain low-fat yogurt. Low-sodium or reduced-sodium cheeses. °Fats and Oils °Tub margarines without trans fats. Light or reduced-fat mayonnaise and salad dressings (reduced sodium). Avocado. Safflower, olive, or canola oils. Natural peanut or almond butter. °Other °Unsalted popcorn and pretzels. °The items listed above may not be a complete list of recommended foods or beverages. Contact your dietitian for more options. °WHAT FOODS ARE NOT RECOMMENDED? °Grains °White bread. White pasta. White rice. Refined cornbread. Bagels and croissants. Crackers that contain trans fat. °Vegetables °Creamed or fried vegetables. Vegetables in a cheese sauce. Regular canned vegetables. Regular canned tomato sauce and paste. Regular tomato and vegetable juices. °Fruits °Dried fruits. Canned fruit in light or heavy syrup. Fruit juice. °Meat and Other Protein Products °Fatty cuts of meat. Ribs, chicken wings, bacon, sausage, bologna, salami, chitterlings, fatback, hot dogs, bratwurst, and packaged luncheon meats. Salted nuts and seeds. Canned beans with salt. °Dairy °Whole or 2% milk, cream, half-and-half, and cream cheese. Whole-fat or sweetened yogurt. Full-fat   cheeses or blue cheese. Nondairy creamers and whipped toppings. Processed cheese, cheese spreads, or cheese  curds. °Condiments °Onion and garlic salt, seasoned salt, table salt, and sea salt. Canned and packaged gravies. Worcestershire sauce. Tartar sauce. Barbecue sauce. Teriyaki sauce. Soy sauce, including reduced sodium. Steak sauce. Fish sauce. Oyster sauce. Cocktail sauce. Horseradish. Ketchup and mustard. Meat flavorings and tenderizers. Bouillon cubes. Hot sauce. Tabasco sauce. Marinades. Taco seasonings. Relishes. °Fats and Oils °Butter, stick margarine, lard, shortening, ghee, and bacon fat. Coconut, palm kernel, or palm oils. Regular salad dressings. °Other °Pickles and olives. Salted popcorn and pretzels. °The items listed above may not be a complete list of foods and beverages to avoid. Contact your dietitian for more information. °WHERE CAN I FIND MORE INFORMATION? °National Heart, [employer], and [employer] Institute: www.nhlbi.nih.gov/health/health-topics/topics/dash/ °Document Released: 06/16/2011 Document Revised: 11/11/2013 Document Reviewed: 05/01/2013 °ExitCare® Patient Information ©2015 [employer]. This information is not intended to replace advice given to you by your health care provider. Make sure you discuss any questions you have with your health care provider. ° °

## 2014-05-23 NOTE — Progress Notes (Signed)
Patient ID: Vanessa [name], female   DOB: Jul 30, 1955, 58 y.o.   MRN: [id number]  CC: follow up  HPI:  Patient reports that Vanessa [name] has been having very heavy cycles with clots passing.  Continues to have a monthly cycles for the past three months.  Vanessa [name] states that Vanessa [name] has had periods where Vanessa [name] skipped cycles for three months and then come back very heavy.  Vanessa [name] reports that Vanessa [name] had chest pain this week with pain radiating to her right shoulder.  Pain in epigastric region and is unsure if it was acid reflux.  Vanessa [name] states that Vanessa [name] had SOB during event as well.  Vanessa [name] has not had pain since this one event.  Has only been taking lasix once per day due to two different pill bottles Vanessa [name] has at home.  Vanessa [name] would like clarification for her medications.  Continues to check blood sugar three times per day with ranges of 170-243. Vanessa [name] is currently taking 25 units of NPH twice daily and glipizide 5 mg once daily.   No Known Allergies Past Medical History  Diagnosis Date  . Asthma   . Diabetes mellitus without complication   . Hypertension   . Heart murmur    Current Outpatient Prescriptions on File Prior to Visit  Medication Sig Dispense Refill  . amLODipine (NORVASC) 10 MG tablet Take 1 tablet (10 mg total) by mouth daily. 30 tablet 0  . aspirin 81 MG tablet Take 81 mg by mouth daily.    Marland Kitchen atorvastatin (LIPITOR) 40 MG tablet Take 1 tablet (40 mg total) by mouth daily. 30 tablet 3  . Blood Glucose Monitoring Suppl (TRUERESULT BLOOD GLUCOSE) W/DEVICE KIT Dispense 1 kit 1 each 0  . furosemide (LASIX) 40 MG tablet Take 1 tablet (40 mg total) by mouth 2 (two) times daily. 60 tablet 0  . glipiZIDE (GLUCOTROL XL) 5 MG 24 hr tablet Take 1 tablet (5 mg total) by mouth daily with breakfast. 30 tablet 2  . glucose blood test strip Use as instructed 100 each 12  . insulin NPH-regular Human (NOVOLIN 70/30) (70-30) 100 UNIT/ML injection Inject 23 Units into the skin 2 (two) times daily with a meal. 10 mL 5  . INSULIN SYRINGE  1CC/29G 29G X 1/2" 1 ML MISC Insulin syringes 100 each 0  . lisinopril (PRINIVIL,ZESTRIL) 20 MG tablet Take 2 tablets (40 mg total) by mouth daily. 30 tablet 0  . TRUEPLUS LANCETS 28G MISC Dispense 100 count lancets 100 each 0  . fluticasone (FLONASE) 50 MCG/ACT nasal spray Place 2 sprays into both nostrils daily. 16 g 3   No current facility-administered medications on file prior to visit.   Family History  Problem Relation Age of Onset  . Stroke Mother   . Diabetes Mother   . Hypertension Mother   . Diabetes Brother   . Hypertension Brother    History   Social History  . Marital Status: Married    Spouse Name: N/A    Number of Children: N/A  . Years of Education: N/A   Occupational History  . Not on file.   Social History Main Topics  . Smoking status: Never Smoker   . Smokeless tobacco: Not on file  . Alcohol Use: No  . Drug Use: No  . Sexual Activity: No   Other Topics Concern  . Not on file   Social History Narrative    Review of Systems  Respiratory: Positive for cough and shortness of breath.   Cardiovascular: Positive for  chest pain and leg swelling. Negative for palpitations.  Neurological: Positive for dizziness.      Objective:   Filed Vitals:   05/23/14 1142  BP: 132/84  Pulse: 71  Temp: 98.5 F (36.9 C)  Resp: 16    Physical Exam  Constitutional: Vanessa [name] is oriented to person, place, and time.  Cardiovascular: Normal rate, regular rhythm and normal heart sounds.   Pulmonary/Chest: Effort normal.  Abdominal: Soft. Bowel sounds are normal. Vanessa [name] exhibits no distension. There is no tenderness.  Musculoskeletal: Vanessa [name] exhibits no edema.  Neurological: Vanessa [name] is alert and oriented to person, place, and time. Vanessa [name] has normal reflexes.  Skin: Skin is warm and dry.  Psychiatric: Vanessa [name] has a normal mood and affect.     Lab Results  Component Value Date   WBC 8.0 03/06/2014   HGB 11.9* 03/06/2014   HCT 35.5* 03/06/2014   MCV 85.5 03/06/2014   PLT 308  03/06/2014   Lab Results  Component Value Date   CREATININE 0.85 03/06/2014   BUN 13 03/06/2014   NA 143 03/06/2014   K 3.4* 03/06/2014   CL 104 03/06/2014   CO2 25 03/06/2014    Lab Results  Component Value Date   HGBA1C 8.7 05/23/2014   Lipid Panel  No results found for: CHOL, TRIG, HDL, CHOLHDL, VLDL, LDLCALC     Assessment and plan:   Vanessa [name] was seen today for follow-up.  Diagnoses and associated orders for this visit:  Type 2 diabetes mellitus without complication - Glucose (CBG) - HgB A1c - glipiZIDE (GLUCOTROL XL) 5 MG 24 hr tablet; Take 1 tablet (5 mg total) by mouth daily with breakfast. - insulin NPH-regular Human (NOVOLIN 70/30) (70-30) 100 UNIT/ML injection; Inject 25 Units into the skin 2 (two) times daily with a meal. Patients diabetes has improved as evidence by significantly lower a1c.  Patient will continue with current therapy and continue to make necessary lifestyle changes.  Reviewed foot care, diet, exercise, annual health maintenance with patient.   Essential hypertension - amLODipine (NORVASC) 10 MG tablet; Take 1 tablet (10 mg total) by mouth daily. - furosemide (LASIX) 40 MG tablet; Take 1 tablet (40 mg total) by mouth 2 (two) times daily. - lisinopril (PRINIVIL,ZESTRIL) 20 MG tablet; Take 2 tablets (40 mg total) by mouth daily. Patient blood pressure is stable and may continue on current medication.  Education on diet, exercise, and modifiable risk factors discussed. Will obtain appropriate labs as needed. Will follow up in 3-6 months.   Dyslipidemia - aspirin 81 MG tablet; Take 1 tablet (81 mg total) by mouth daily. - atorvastatin (LIPITOR) 40 MG tablet; Take 1 tablet (40 mg total) by mouth daily. Education provided on proper lifestyle changes in order to lower cholesterol. Patient advised to maintain healthy weight and to keep total fat intake at 25-35% of total calories and carbohydrates 50-60% of total daily calories. Explained how high  cholesterol places patient at risk for heart disease. Patient placed on appropriate medication and repeat labs in 6 months   Chest discomfort - EKG 12-Lead Explained signs and symptoms that should warrant immediate attention.  Patient verbalized understanding with teach back used.  Abnormal blood chemistry - COMPLETE METABOLIC PANEL WITH GFR   Return in about 6 weeks (around 07/04/2014) for Nurse Visit-Cbg review and 3 mo PCP.        Chari Manning, [facility] [phone] 05/23/2014, 12:14 PM

## 2014-05-27 ENCOUNTER — Other Ambulatory Visit: Payer: Self-pay | Admitting: Internal Medicine

## 2014-05-27 DIAGNOSIS — I10 Essential (primary) hypertension: Secondary | ICD-10-CM

## 2014-05-27 MED ORDER — LISINOPRIL 40 MG PO TABS: 40.0000 mg | ORAL_TABLET | Freq: Every day | ORAL | Status: DC

## 2014-06-04 ENCOUNTER — Other Ambulatory Visit: Payer: Self-pay | Admitting: Emergency Medicine

## 2014-06-04 DIAGNOSIS — I10 Essential (primary) hypertension: Secondary | ICD-10-CM

## 2014-06-04 DIAGNOSIS — J302 Other seasonal allergic rhinitis: Secondary | ICD-10-CM

## 2014-06-04 MED ORDER — FLUTICASONE PROPIONATE 50 MCG/ACT NA SUSP: 2.0000 | Freq: Every day | NASAL | Status: DC

## 2014-06-04 MED ORDER — LISINOPRIL 40 MG PO TABS: 40.0000 mg | ORAL_TABLET | Freq: Every day | ORAL | Status: DC

## 2014-06-04 MED ORDER — GLUCOSE BLOOD VI STRP: ORAL_STRIP | Status: DC

## 2014-08-11 ENCOUNTER — Ambulatory Visit: Payer: Medicaid - Out of State

## 2015-03-23 ENCOUNTER — Encounter: Payer: Self-pay | Admitting: Internal Medicine

## 2015-03-23 ENCOUNTER — Ambulatory Visit: Payer: Medicaid - Out of State | Attending: Internal Medicine | Admitting: Internal Medicine

## 2015-03-23 VITALS — BP 175/99 | HR 76 | Temp 98.8°F | Resp 16 | Ht 60.0 in | Wt 190.0 lb

## 2015-03-23 DIAGNOSIS — E119 Type 2 diabetes mellitus without complications: Secondary | ICD-10-CM

## 2015-03-23 DIAGNOSIS — E785 Hyperlipidemia, unspecified: Secondary | ICD-10-CM

## 2015-03-23 DIAGNOSIS — Z9114 Patient's other noncompliance with medication regimen: Secondary | ICD-10-CM

## 2015-03-23 DIAGNOSIS — I10 Essential (primary) hypertension: Secondary | ICD-10-CM | POA: Diagnosis not present

## 2015-03-23 DIAGNOSIS — Z794 Long term (current) use of insulin: Secondary | ICD-10-CM | POA: Insufficient documentation

## 2015-03-23 LAB — POCT GLYCOSYLATED HEMOGLOBIN (HGB A1C): Hemoglobin A1C: 14.7

## 2015-03-23 LAB — GLUCOSE, POCT (MANUAL RESULT ENTRY)
POC GLUCOSE: 334 mg/dL — AB (ref 70–99)
POC Glucose: 261 mg/dl — AB (ref 70–99)

## 2015-03-23 MED ORDER — GLIPIZIDE ER 5 MG PO TB24: 5.0000 mg | ORAL_TABLET | Freq: Every day | ORAL | Status: DC

## 2015-03-23 MED ORDER — LISINOPRIL 40 MG PO TABS: 40.0000 mg | ORAL_TABLET | Freq: Every day | ORAL | Status: DC

## 2015-03-23 MED ORDER — AMLODIPINE BESYLATE 10 MG PO TABS: 10.0000 mg | ORAL_TABLET | Freq: Every day | ORAL | Status: DC

## 2015-03-23 MED ORDER — TRUEPLUS LANCETS 28G MISC: Status: DC

## 2015-03-23 MED ORDER — GLUCOSE BLOOD VI STRP: ORAL_STRIP | Status: DC

## 2015-03-23 MED ORDER — INSULIN NPH ISOPHANE & REGULAR (70-30) 100 UNIT/ML ~~LOC~~ SUSP: 25.0000 [IU] | Freq: Two times a day (BID) | SUBCUTANEOUS | Status: DC

## 2015-03-23 MED ORDER — INSULIN ASPART 100 UNIT/ML ~~LOC~~ SOLN
10.0000 [IU] | Freq: Once | SUBCUTANEOUS | Status: AC
  Administered 2015-03-23: 10 [IU] via SUBCUTANEOUS

## 2015-03-23 MED ORDER — ATORVASTATIN CALCIUM 40 MG PO TABS: 40.0000 mg | ORAL_TABLET | Freq: Every day | ORAL | Status: DC

## 2015-03-23 NOTE — Progress Notes (Signed)
Patient ID: Vanessa [name], female   DOB: [birth year], 59 y.o.   MRN: [id number]  CC: Diabetes and hypertension follow-up  HPI: Vanessa [name] is a 59 y.o. female here today for a follow up visit.  Patient has past medical history of diabetes, heart murmur, hypertension, asthma. Patient has not been evaluated here in almost one year and reports that she has not been anywhere else for care. Patient reports that she has not been taking her insulin as directed and has only taken it 1-2 times per week due to lack of insurance and money to afford medication. Patient reports that she has been out of her blood pressure medication for several months. Patient reports urinary frequency but denies blurred vision, neuropathy, dizziness, hypoglycemic events. Patient currently does not check her sugars. Patient also reports that she has had an infestation of bed bugs in her home and now has hyperpigmented areas throughout her whole body. Patient reports her home has been treated 3 times in 3 months for bedbugs..  No Known Allergies Past Medical History  Diagnosis Date  . Asthma   . Diabetes mellitus without complication   . Hypertension   . Heart murmur    Current Outpatient Prescriptions on File Prior to Visit  Medication Sig Dispense Refill  . amLODipine (NORVASC) 10 MG tablet Take 1 tablet (10 mg total) by mouth daily. 30 tablet 3  . aspirin 81 MG tablet Take 1 tablet (81 mg total) by mouth daily. 30 tablet 3  . atorvastatin (LIPITOR) 40 MG tablet Take 1 tablet (40 mg total) by mouth daily. 30 tablet 3  . Blood Glucose Monitoring Suppl (TRUERESULT BLOOD GLUCOSE) W/DEVICE KIT Dispense 1 kit 1 each 0  . fluticasone (FLONASE) 50 MCG/ACT nasal spray Place 2 sprays into both nostrils daily. 16 g 3  . furosemide (LASIX) 40 MG tablet Take 1 tablet (40 mg total) by mouth 2 (two) times daily. 60 tablet 3  . glipiZIDE (GLUCOTROL XL) 5 MG 24 hr tablet Take 1 tablet (5 mg total) by mouth daily with breakfast. 30 tablet  3  . glucose blood (TRUETEST TEST) test strip Use as instructed 100 each 12  . glucose blood test strip Use as instructed 100 each 12  . insulin NPH-regular Human (NOVOLIN 70/30) (70-30) 100 UNIT/ML injection Inject 25 Units into the skin 2 (two) times daily with a meal. 10 mL 5  . INSULIN SYRINGE 1CC/29G 29G X 1/2" 1 ML MISC Insulin syringes 100 each 0  . lisinopril (PRINIVIL,ZESTRIL) 40 MG tablet Take 1 tablet (40 mg total) by mouth daily. 30 tablet 4  . TRUEPLUS LANCETS 28G MISC Dispense 100 count lancets 100 each 0   No current facility-administered medications on file prior to visit.   Family History  Problem Relation Age of Onset  . Stroke Mother   . Diabetes Mother   . Hypertension Mother   . Diabetes Brother   . Hypertension Brother    Social History   Social History  . Marital Status: Married    Spouse Name: N/A  . Number of Children: N/A  . Years of Education: N/A   Occupational History  . Not on file.   Social History Main Topics  . Smoking status: Never Smoker   . Smokeless tobacco: Not on file  . Alcohol Use: No  . Drug Use: No  . Sexual Activity: No   Other Topics Concern  . Not on file   Social History Narrative    Review of Systems  Eyes: Negative for blurred vision.  Respiratory: Negative for cough and shortness of breath.   Cardiovascular: Negative for chest pain, palpitations and leg swelling.  Genitourinary: Positive for frequency.  Skin: Positive for itching and rash.  Neurological: Negative for dizziness and headaches.  All other systems reviewed and are negative.   Objective:   Filed Vitals:   03/23/15 1615  BP: 175/99  Pulse: 76  Temp: 98.8 F (37.1 C)  Resp: 16    Physical Exam  Constitutional: She is oriented to person, place, and time.  Neck: No JVD present.  Cardiovascular: Normal rate, regular rhythm and normal heart sounds.   Pulmonary/Chest: Effort normal and breath sounds normal.  Musculoskeletal: She exhibits no  edema.  Feet:  Right Foot:  Skin Integrity: Negative for skin breakdown.  Left Foot:  Skin Integrity: Negative for skin breakdown.  Neurological: She is alert and oriented to person, place, and time.  Skin: Skin is warm and dry. Rash (scattered throughout) noted.  Psychiatric: She has a normal mood and affect.     Lab Results  Component Value Date   WBC 8.0 03/06/2014   HGB 11.9* 03/06/2014   HCT 35.5* 03/06/2014   MCV 85.5 03/06/2014   PLT 308 03/06/2014   Lab Results  Component Value Date   CREATININE 0.98 05/23/2014   BUN 14 05/23/2014   NA 144 05/23/2014   K 4.0 05/23/2014   CL 105 05/23/2014   CO2 25 05/23/2014    Lab Results  Component Value Date   HGBA1C 14.7 03/23/2015   Lipid Panel  No results found for: CHOL, TRIG, HDL, CHOLHDL, VLDL, LDLCALC     Assessment and plan:   Avonda was seen today for follow-up.  Diagnoses and all orders for this visit:  Type 2 diabetes mellitus without complication -     POCT glycosylated hemoglobin (Hb A1C) -     POCT glucose (manual entry) -     Microalbumin, urine -     Cancel: Urinalysis Dipstick -     insulin aspart (novoLOG) injection 10 Units; Inject 0.1 mLs (10 Units total) into the skin once. -     POCT glucose (manual entry) -     Refill glipiZIDE (GLUCOTROL XL) 5 MG 24 hr tablet; Take 1 tablet (5 mg total) by mouth daily with breakfast. -    Refill insulin NPH-regular Human (NOVOLIN 70/30) (70-30) 100 UNIT/ML injection; Inject 25 Units into the skin 2 (two) times daily with a meal. -     TRUEPLUS LANCETS 28G MISC; Dispense 100 count lancets -     glucose blood (TRUETEST TEST) test strip; Use as instructed Patients diabetes remains uncontrolled as evidence by hemoglobin a1c >8.  Patient has been non-compliant with medication regimen. Stressed the multiple complications associated with uncontrolled diabetes.  Patient will stay on current medication dose and report back to clinic with cbg log in 2 weeks.  Essential  hypertension -    Refill amLODipine (NORVASC) 10 MG tablet; Take 1 tablet (10 mg total) by mouth daily. -     Refill lisinopril (PRINIVIL,ZESTRIL) 40 MG tablet; Take 1 tablet (40 mg total) by mouth daily. Patient's blood pressure is currently uncontrolled due to medication noncompliance. Will recheck patient's blood pressure when she comes back in 2 weeks with CBG log  Dyslipidemia -     Refill atorvastatin (LIPITOR) 40 MG tablet; Take 1 tablet (40 mg total) by mouth daily. Education provided on proper lifestyle changes in order to lower cholesterol. Patient  advised to maintain healthy weight and to keep total fat intake at 25-35% of total calories and carbohydrates 50-60% of total daily calories. Explained how high cholesterol places patient at risk for heart disease. Patient placed on appropriate medication and repeat labs in 6 months    Return in about 2 weeks (around 04/06/2015) for Nurse Visit--log review and 3 mo PCP .        Lance Bosch, [facility] [phone] 03/23/2015, 5:05 PM

## 2015-03-23 NOTE — Progress Notes (Signed)
F/U DM Stated not checking glucose at home as directed  Taking Novolin 70/30 24 unit per day, not as prescribed   No Hx tobacco    GAD 7 SCORE 6

## 2015-03-24 ENCOUNTER — Other Ambulatory Visit: Payer: Self-pay | Admitting: Pharmacist

## 2015-03-24 DIAGNOSIS — E785 Hyperlipidemia, unspecified: Secondary | ICD-10-CM | POA: Insufficient documentation

## 2015-03-24 DIAGNOSIS — Z9114 Patient's other noncompliance with medication regimen: Secondary | ICD-10-CM | POA: Insufficient documentation

## 2015-03-24 DIAGNOSIS — Z91148 Patient's other noncompliance with medication regimen for other reason: Secondary | ICD-10-CM | POA: Insufficient documentation

## 2015-03-24 LAB — MICROALBUMIN, URINE: MICROALB UR: 134.9 mg/dL — AB (ref <2.0)

## 2015-03-24 MED ORDER — GLUCOSE BLOOD VI STRP: ORAL_STRIP | Status: DC

## 2015-04-07 ENCOUNTER — Encounter: Payer: Medicaid - Out of State | Admitting: Pharmacist

## 2015-04-13 ENCOUNTER — Other Ambulatory Visit: Payer: Self-pay | Admitting: Internal Medicine

## 2015-04-13 ENCOUNTER — Telehealth: Payer: Self-pay | Admitting: General Practice

## 2015-04-13 NOTE — Telephone Encounter (Signed)
Patient presents to clinic to request medication refills for water pill and allergy medication and monitor for DM (her original monitor is broken).  Patient does not have the exact name of the prescriptions. Please assist

## 2015-04-24 NOTE — Telephone Encounter (Signed)
Pt. Called requesting a med refill on generic fluid pill, pt. Does not remember the name. Pt. Is also requesting a new machine to to check to her blood sugar b/c the one that she has broke. Please f/u with pt.

## 2015-04-28 ENCOUNTER — Telehealth: Payer: Self-pay

## 2015-04-28 NOTE — Telephone Encounter (Signed)
Patient requesting a refill on her lasix but  i see i has not been filled since nov Is patient supposed to be taking this If so can i refill it

## 2015-04-28 NOTE — Telephone Encounter (Signed)
No she needs repeat blood work. She was on it before coming to me, please find out why? History of CHF? Edema?

## 2015-07-17 ENCOUNTER — Other Ambulatory Visit: Payer: Self-pay

## 2015-07-17 DIAGNOSIS — E119 Type 2 diabetes mellitus without complications: Secondary | ICD-10-CM

## 2015-07-17 MED ORDER — INSULIN NPH ISOPHANE & REGULAR (70-30) 100 UNIT/ML ~~LOC~~ SUSP: 25.0000 [IU] | Freq: Two times a day (BID) | SUBCUTANEOUS | Status: DC

## 2015-07-24 ENCOUNTER — Ambulatory Visit: Payer: Medicaid - Out of State | Attending: Internal Medicine | Admitting: Internal Medicine

## 2015-07-24 ENCOUNTER — Encounter: Payer: Self-pay | Admitting: Internal Medicine

## 2015-07-24 VITALS — BP 140/88 | HR 72 | Temp 98.0°F | Resp 16 | Ht 60.0 in | Wt 225.6 lb

## 2015-07-24 DIAGNOSIS — J309 Allergic rhinitis, unspecified: Secondary | ICD-10-CM | POA: Diagnosis not present

## 2015-07-24 DIAGNOSIS — H109 Unspecified conjunctivitis: Secondary | ICD-10-CM | POA: Insufficient documentation

## 2015-07-24 DIAGNOSIS — I10 Essential (primary) hypertension: Secondary | ICD-10-CM | POA: Insufficient documentation

## 2015-07-24 MED ORDER — LORATADINE 10 MG PO TABS: 10.0000 mg | ORAL_TABLET | Freq: Every day | ORAL | Status: DC

## 2015-07-24 MED ORDER — FLUTICASONE PROPIONATE 50 MCG/ACT NA SUSP: 2.0000 | Freq: Every day | NASAL | Status: DC

## 2015-07-24 MED FILL — FLUTICASONE PROP 50 MCG SPR: 50 | 30 days supply | Qty: 16 | Fill #0

## 2015-07-24 NOTE — Patient Instructions (Signed)
You may try some OTC allergy eye drops or some regular artificial tears to see if that helps with burning and itching.   I have sent your nasal spray to the pharmacy as well as some claritin pills for allergy. I believe this will help your all of your symptoms

## 2015-07-24 NOTE — Progress Notes (Signed)
   Subjective:    Patient ID: Vanessa [name], female    DOB: 1956/03/26, 60 y.o.   MRN: [id number]  HPI Comments:    Conjunctivitis  The current episode started 3 to 5 days ago. The problem is mild. Nothing aggravates the symptoms. Associated symptoms include eye itching, congestion, rhinorrhea, cough, eye discharge (watery ) and eye redness. Pertinent negatives include no fever, no double vision, no photophobia, no abdominal pain, no constipation, no diarrhea, no nausea, no vomiting, no ear discharge, no neck stiffness, no rash and no eye pain (burning, dry). There were no sick contacts.    Review of Systems  Constitutional: Negative for fever.  HENT: Positive for congestion, rhinorrhea and sneezing. Negative for ear discharge.   Eyes: Positive for discharge (watery ), redness and itching. Negative for double vision, photophobia and pain (burning, dry).  Respiratory: Positive for cough.   Gastrointestinal: Negative for nausea, vomiting, abdominal pain, diarrhea and constipation.  Skin: Negative for rash.  All other systems reviewed and are negative.      Objective:   Physical Exam  HENT:  Eustachian tubes in place bilaterally    Eyes: Conjunctivae are normal. Right eye exhibits no discharge. Left eye exhibits no discharge.  Cardiovascular: Normal rate, regular rhythm and normal heart sounds.   Pulmonary/Chest: Effort normal and breath sounds normal.  Lymphadenopathy:    She has no cervical adenopathy.  Skin: Skin is warm and dry.       Assessment & Plan:  Shanni was seen today for conjunctivitis.  Diagnoses and all orders for this visit:  Allergic rhinitis, unspecified allergic rhinitis type -     fluticasone (FLONASE) 50 MCG/ACT nasal spray; Place 2 sprays into both nostrils daily. -     loratadine (CLARITIN) 10 MG tablet; Take 1 tablet (10 mg total) by mouth daily. She may try some OTC allergy eye drops which will be more affordable for her since she is uninsured. May use  artificial tears through day to help with burning and dryness   Return in about 1 week (around 07/31/2015) for DM/HTN.  Lance Bosch, NP 07/24/2015 5:25 PM

## 2015-07-24 NOTE — Progress Notes (Signed)
Patient complains of having some redness to her right eye that started  A couple of days ago Having some discharge from the eye and crusty in the am

## 2015-07-29 ENCOUNTER — Other Ambulatory Visit: Payer: Self-pay | Admitting: Internal Medicine

## 2015-07-29 DIAGNOSIS — E119 Type 2 diabetes mellitus without complications: Secondary | ICD-10-CM

## 2015-07-29 MED ORDER — INSULIN NPH ISOPHANE & REGULAR (70-30) 100 UNIT/ML ~~LOC~~ SUSP: 25.0000 [IU] | Freq: Two times a day (BID) | SUBCUTANEOUS | Status: DC

## 2015-07-31 ENCOUNTER — Other Ambulatory Visit: Payer: Self-pay | Admitting: Internal Medicine

## 2015-07-31 ENCOUNTER — Other Ambulatory Visit: Payer: Self-pay

## 2015-07-31 DIAGNOSIS — E119 Type 2 diabetes mellitus without complications: Secondary | ICD-10-CM

## 2015-07-31 MED ORDER — INSULIN NPH ISOPHANE & REGULAR (70-30) 100 UNIT/ML ~~LOC~~ SUSP
25.0000 [IU] | Freq: Two times a day (BID) | SUBCUTANEOUS | Status: DC
Start: 2015-07-31 — End: 2015-12-21

## 2015-07-31 MED FILL — AMLODIPINE BESYLATE 10 MG T: 10 | 30 days supply | Qty: 30 | Fill #0

## 2015-07-31 MED FILL — ?ATORVASTATIN 40MG TABLET: 40 | 30 days supply | Qty: 30 | Fill #0

## 2015-07-31 MED FILL — LISINOPRIL 40 MG TABLET: 40 | 30 days supply | Qty: 30 | Fill #4

## 2015-07-31 MED FILL — !NOVOLIN 70/30 100 UNITS/ML: (70-30) 100 | 28 days supply | Qty: 10 | Fill #0

## 2015-08-03 ENCOUNTER — Encounter: Payer: Self-pay | Admitting: Internal Medicine

## 2015-08-03 ENCOUNTER — Ambulatory Visit: Payer: Self-pay | Attending: Internal Medicine | Admitting: Internal Medicine

## 2015-08-03 VITALS — BP 128/85 | HR 87 | Temp 98.0°F | Resp 16 | Ht 60.0 in | Wt 227.0 lb

## 2015-08-03 DIAGNOSIS — E119 Type 2 diabetes mellitus without complications: Secondary | ICD-10-CM | POA: Insufficient documentation

## 2015-08-03 DIAGNOSIS — Z0000 Encounter for general adult medical examination without abnormal findings: Secondary | ICD-10-CM

## 2015-08-03 DIAGNOSIS — Z794 Long term (current) use of insulin: Secondary | ICD-10-CM | POA: Insufficient documentation

## 2015-08-03 DIAGNOSIS — Z7982 Long term (current) use of aspirin: Secondary | ICD-10-CM | POA: Insufficient documentation

## 2015-08-03 DIAGNOSIS — I10 Essential (primary) hypertension: Secondary | ICD-10-CM | POA: Insufficient documentation

## 2015-08-03 DIAGNOSIS — Z79899 Other long term (current) drug therapy: Secondary | ICD-10-CM | POA: Insufficient documentation

## 2015-08-03 DIAGNOSIS — E785 Hyperlipidemia, unspecified: Secondary | ICD-10-CM | POA: Insufficient documentation

## 2015-08-03 LAB — GLUCOSE, POCT (MANUAL RESULT ENTRY): POC Glucose: 168 mg/dl — AB (ref 70–99)

## 2015-08-03 LAB — POCT GLYCOSYLATED HEMOGLOBIN (HGB A1C): Hemoglobin A1C: 9.6

## 2015-08-03 MED ORDER — TRUEPLUS LANCETS 28G MISC: Status: DC

## 2015-08-03 MED ORDER — FUROSEMIDE 20 MG PO TABS: 20.0000 mg | ORAL_TABLET | Freq: Every day | ORAL | Status: DC

## 2015-08-03 MED ORDER — GLUCOSE BLOOD VI STRP: ORAL_STRIP | Status: DC

## 2015-08-03 MED ORDER — TRUE METRIX METER W/DEVICE KIT: PACK | Status: DC

## 2015-08-03 MED ORDER — LISINOPRIL 40 MG PO TABS: 40.0000 mg | ORAL_TABLET | Freq: Every day | ORAL | Status: DC

## 2015-08-03 MED ORDER — GLIPIZIDE ER 5 MG PO TB24: 5.0000 mg | ORAL_TABLET | Freq: Every day | ORAL | Status: DC

## 2015-08-03 MED FILL — !TRUE METRIX BLOOD GLUCOSE: 365 days supply | Qty: 1 | Fill #0

## 2015-08-03 MED FILL — LISINOPRIL 40 MG TABLET: 40 | 30 days supply | Qty: 30 | Fill #0

## 2015-08-03 MED FILL — glipiZIDE XL 5 MG TB24: 5 | 30 days supply | Qty: 30 | Fill #0

## 2015-08-03 MED FILL — TRUE METRIX TEST STRIP: 33 days supply | Qty: 100 | Fill #0

## 2015-08-03 MED FILL — ?FUROSEMIDE 20 MG TABLET: 20 | 30 days supply | Qty: 30 | Fill #0

## 2015-08-03 MED FILL — TRUEplus LANCETS 28G MISC: 33 days supply | Qty: 100 | Fill #0

## 2015-08-03 NOTE — Patient Instructions (Addendum)
Please call Vanessa [name], [phone],  with the BCCCP (breast and cervical cancer control program) at the [facility] to set up an appointment to verify eligibility for a breast exam, mammogram, ultrasound. If you qualify this will be set up at [facility].  We have a podiatrist who will cut your toenails that will come next month. There is no charge. 1 week before someone from this office will call you and schedule a time for you

## 2015-08-03 NOTE — Progress Notes (Signed)
Patient ID: Vanessa [name], female   DOB: 1956/03/06, 60 y.o.   MRN: [id number] SUBJECTIVE: 60 y.o. female for follow up of diabetes, HTN, and HLD. Diabetic Review of Systems - medication compliance: noncompliant some of the time, diabetic diet compliance: noncompliant some of the time, home glucose monitoring: is performed regularly, further diabetic ROS: no polyuria or polydipsia, no chest pain, dyspnea or TIA's, no numbness, tingling or pain in extremities, no unusual visual symptoms, no hypoglycemia.  Other symptoms and concerns: Been having swellingin her bilateral lower extremities and would like to have her lasix refilled.  Current Outpatient Prescriptions  Medication Sig Dispense Refill  . amLODipine (NORVASC) 10 MG tablet TAKE 1 TABLET BY MOUTH DAILY. 30 tablet 2  . aspirin 81 MG tablet Take 1 tablet (81 mg total) by mouth daily. 30 tablet 3  . fluticasone (FLONASE) 50 MCG/ACT nasal spray Place 2 sprays into both nostrils daily. 16 g 3  . glipiZIDE (GLUCOTROL XL) 5 MG 24 hr tablet Take 1 tablet (5 mg total) by mouth daily with breakfast. 30 tablet 3  . insulin NPH-regular Human (NOVOLIN 70/30) (70-30) 100 UNIT/ML injection Inject 25 Units into the skin 2 (two) times daily with a meal. 10 mL 3  . lisinopril (PRINIVIL,ZESTRIL) 40 MG tablet Take 1 tablet (40 mg total) by mouth daily. 30 tablet 4  . atorvastatin (LIPITOR) 40 MG tablet TAKE 1 TABLET BY MOUTH DAILY. 30 tablet 2  . Blood Glucose Monitoring Suppl (TRUERESULT BLOOD GLUCOSE) W/DEVICE KIT Dispense 1 kit 1 each 0  . furosemide (LASIX) 40 MG tablet Take 1 tablet (40 mg total) by mouth 2 (two) times daily. 60 tablet 3  . glucose blood (TRUE METRIX BLOOD GLUCOSE TEST) test strip Use as instructed 100 each 12  . glucose blood test strip Use as instructed 100 each 12  . INSULIN SYRINGE 1CC/29G 29G X 1/2" 1 ML MISC Insulin syringes 100 each 0  . loratadine (CLARITIN) 10 MG tablet Take 1 tablet (10 mg total) by mouth daily. 30 tablet 11  .  TRUEPLUS LANCETS 28G MISC Dispense 100 count lancets 100 each 0   No current facility-administered medications for this visit.  Review of systems: Other than what is stated in HPI, all other systems are negative.   OBJECTIVE: Appearance: alert, well appearing, and in no distress, oriented to person, place, and time and overweight. BP 128/85 mmHg  Pulse 87  Temp(Src) 98 F (36.7 C)  Resp 16  Ht 5' (1.524 m)  Wt 227 lb (102.967 kg)  BMI 44.33 kg/m2  SpO2 100%  Exam: heart sounds normal rate, regular rhythm, normal S1, S2, no murmurs, rubs, clicks or gallops, no JVD, chest clear, no carotid bruits, feet: warm, good capillary refill, no trophic changes or ulcerative lesions, normal DP and PT pulses, normal monofilament exam and normal sensory exam, long/thick toenails  ASSESSMENT: Vanessa [name] was seen today for follow-up.  Diagnoses and all orders for this visit:  Type 2 diabetes mellitus without complication, with long-term current use of insulin (HCC) -     Glucose (CBG) -     HgB A1c -     Flu Vaccine QUAD 36+ mos PF IM (Fluarix & Fluzone Quad PF) -     glipiZIDE (GLUCOTROL XL) 5 MG 24 hr tablet; Take 1 tablet (5 mg total) by mouth daily with breakfast. -     Blood Glucose Monitoring Suppl (TRUE METRIX METER) w/Device KIT; Check sugar before meals for E11.9 -  glucose blood (TRUE METRIX BLOOD GLUCOSE TEST) test strip; Check sugar before meals for E11.9 -     TRUEPLUS LANCETS 28G MISC; Check sugar before meals for E11.9 -     CBC with Differential; Future A1C has improved but patient is still not close to goal. Goal for this patient would be 7%. I have applauded patient for her hard work and significant decrease in A1C but I have stressed that she continue to keep up the good work and focus on diet, exercise, and weight loss.  I will place her name on next months podiatry list so that she can have her nails clipped.   Essential hypertension -     furosemide (LASIX) 20 MG tablet; Take 1  tablet (20 mg total) by mouth daily. -     lisinopril (PRINIVIL,ZESTRIL) 40 MG tablet; Take 1 tablet (40 mg total) by mouth daily. -     Basic Metabolic Panel; Future Patient blood pressure is stable and may continue on current medication.  Education on diet, exercise, and modifiable risk factors discussed. Will obtain appropriate labs as needed. Will follow up in 3-6 months.   HLD (hyperlipidemia) -     Lipid panel; Future Education provided on proper lifestyle changes in order to lower cholesterol. Patient advised to maintain healthy weight and to keep total fat intake at 25-35% of total calories and carbohydrates 50-60% of total daily calories. Explained how high cholesterol places patient at risk for heart disease. Patient placed on appropriate medication and repeat labs in 6 months   Health care maintenance -     HIV antibody (with reflex); Future -     Hepatitis panel, acute; Future   PLAN: See orders for this visit as documented in the electronic medical record. Issues reviewed with her: diabetic diet discussed in detail, written exchange diet given, low cholesterol diet, weight control and daily exercise discussed, home glucose monitoring emphasized, foot care discussed and Podiatry visits discussed, annual eye examinations at Ophthalmology discussed and long term diabetic complications discussed.  Return in about 1 week (around 08/10/2015) for Lab Visit and 3 mo PCP HTN.   Lance Bosch, NP 08/03/2015 8:46 PM

## 2015-08-03 NOTE — Progress Notes (Signed)
Patient here for follow up on her diabetes and for a refill on her lasix

## 2015-08-10 ENCOUNTER — Ambulatory Visit: Payer: Medicaid - Out of State | Attending: Internal Medicine

## 2015-08-10 DIAGNOSIS — I10 Essential (primary) hypertension: Secondary | ICD-10-CM

## 2015-08-10 DIAGNOSIS — E119 Type 2 diabetes mellitus without complications: Secondary | ICD-10-CM

## 2015-08-10 DIAGNOSIS — E785 Hyperlipidemia, unspecified: Secondary | ICD-10-CM

## 2015-08-10 DIAGNOSIS — Z0000 Encounter for general adult medical examination without abnormal findings: Secondary | ICD-10-CM

## 2015-08-10 DIAGNOSIS — Z794 Long term (current) use of insulin: Secondary | ICD-10-CM

## 2015-08-10 LAB — LIPID PANEL
Cholesterol: 153 mg/dL (ref 125–200)
HDL: 61 mg/dL (ref >=46)
LDL CALC: 76 mg/dL (ref <130)
TRIGLYCERIDES: 82 mg/dL (ref <150)
Total CHOL/HDL Ratio: 2.5 Ratio (ref <=5.0)
VLDL: 16 mg/dL (ref <30)

## 2015-08-10 LAB — HIV ANTIBODY (ROUTINE TESTING W REFLEX): HIV: NONREACTIVE

## 2015-08-10 LAB — CBC WITH DIFFERENTIAL/PLATELET
BASOS PCT: 1 % (ref 0–1)
Basophils Absolute: 0.1 10*3/uL (ref 0.0–0.1)
EOS ABS: 0.2 10*3/uL (ref 0.0–0.7)
Eosinophils Relative: 3 % (ref 0–5)
HCT: 39.7 % (ref 36.0–46.0)
Hemoglobin: 13.3 g/dL (ref 12.0–15.0)
Lymphocytes Relative: 40 % (ref 12–46)
Lymphs Abs: 2.2 10*3/uL (ref 0.7–4.0)
MCH: 29.3 pg (ref 26.0–34.0)
MCHC: 33.5 g/dL (ref 30.0–36.0)
MCV: 87.4 fL (ref 78.0–100.0)
MONO ABS: 0.3 10*3/uL (ref 0.1–1.0)
MONOS PCT: 6 % (ref 3–12)
MPV: 9.1 fL (ref 8.6–12.4)
NEUTROS PCT: 50 % (ref 43–77)
Neutro Abs: 2.8 10*3/uL (ref 1.7–7.7)
PLATELETS: 333 10*3/uL (ref 150–400)
RBC: 4.54 MIL/uL (ref 3.87–5.11)
RDW: 15 % (ref 11.5–15.5)
WBC: 5.5 10*3/uL (ref 4.0–10.5)

## 2015-08-10 LAB — HEPATITIS PANEL, ACUTE
HCV Ab: NEGATIVE
Hep A IgM: NONREACTIVE
Hep B C IgM: NONREACTIVE
Hepatitis B Surface Ag: NEGATIVE

## 2015-08-10 LAB — BASIC METABOLIC PANEL
BUN: 10 mg/dL (ref 7–25)
CO2: 28 mmol/L (ref 20–31)
CREATININE: 0.97 mg/dL (ref 0.50–0.99)
Calcium: 8.7 mg/dL (ref 8.6–10.4)
Chloride: 103 mmol/L (ref 98–110)
Glucose, Bld: 280 mg/dL — ABNORMAL HIGH (ref 65–99)
POTASSIUM: 3.8 mmol/L (ref 3.5–5.3)
Sodium: 141 mmol/L (ref 135–146)

## 2015-08-11 ENCOUNTER — Telehealth: Payer: Self-pay

## 2015-08-11 NOTE — Telephone Encounter (Signed)
Tried to call patient this am Patient not available Left message with family member for her to return our call

## 2015-08-11 NOTE — Telephone Encounter (Signed)
-----   Message from Lance Bosch, NP sent at 08/11/2015  7:25 AM EST ----- Her bad cholesterol is still slightly higher than I would like. I need her to really focus on her diet---no fried foods and stay away from starchy meals. Everything else looks good.

## 2015-08-12 ENCOUNTER — Telehealth: Payer: Self-pay | Admitting: Internal Medicine

## 2015-08-12 ENCOUNTER — Telehealth: Payer: Self-pay

## 2015-08-12 NOTE — Telephone Encounter (Signed)
Returned patient phone call Patient not available Message left on voice mail to return our call

## 2015-08-12 NOTE — Telephone Encounter (Signed)
Pt. Returned call. Please f/u with pt. °

## 2015-08-20 MED FILL — NOVOLIN 70/30 100 UNITS/ML: (70-30) 100 | 28 days supply | Qty: 10 | Fill #1

## 2015-09-14 MED FILL — $novoLIN 70/30 100 UNITS/ML: (70-30) 100 | 40 days supply | Qty: 20 | Fill #0

## 2015-09-14 MED FILL — LISINOPRIL 40 MG TABLET: 40 | 30 days supply | Qty: 30 | Fill #1

## 2015-09-14 MED FILL — FLUTICASONE PROP 50 MCG SPR: 50 | 30 days supply | Qty: 16 | Fill #1

## 2015-09-14 MED FILL — ?AMLODIPINE BESYLATE 10 MG: 10 | 30 days supply | Qty: 30 | Fill #1

## 2015-09-14 MED FILL — ?ATORVASTATIN 40MG TABLET: 40 | 30 days supply | Qty: 30 | Fill #1

## 2015-10-16 MED FILL — LISINOPRIL 40 MG TABLET: 40 | 30 days supply | Qty: 30 | Fill #2

## 2015-10-16 MED FILL — TRUEplus LANCETS 28G MISC: 33 days supply | Qty: 100 | Fill #1

## 2015-10-16 MED FILL — $novoLIN 70/30 100 UNITS/ML: (70-30) 100 | 28 days supply | Qty: 10 | Fill #2

## 2015-10-16 MED FILL — ATORVASTATIN 40 MG TABLET: 40 | 30 days supply | Qty: 30 | Fill #2

## 2015-10-16 MED FILL — AMLODIPINE BESYLATE 10 MG T: 10 | 30 days supply | Qty: 30 | Fill #2

## 2015-10-16 MED FILL — TRUE METRIX TEST STRIP: 33 days supply | Qty: 100 | Fill #1

## 2015-10-16 MED FILL — FLUTICASONE PROP 50 MCG SPR: 50 | 30 days supply | Qty: 16 | Fill #2

## 2015-10-29 MED FILL — $novoLIN 70/30 100 UNITS/ML: (70-30) 100 | 28 days supply | Qty: 10 | Fill #3

## 2015-11-11 MED FILL — $novoLIN 70/30 100 UNITS/ML: (70-30) 100 | 20 days supply | Qty: 10 | Fill #1

## 2015-11-11 MED FILL — ?ATORVASTATIN 40MG TABLET: 40 | 30 days supply | Qty: 30 | Fill #3

## 2015-11-11 MED FILL — LISINOPRIL 40 MG TABLET: 40 | 30 days supply | Qty: 30 | Fill #3

## 2015-11-11 MED FILL — glipiZIDE XL 5 MG TB24: 5 | 30 days supply | Qty: 30 | Fill #1

## 2015-11-11 MED FILL — ?AMLODIPINE BESYLATE 10 MG: 10 | 30 days supply | Qty: 30 | Fill #3

## 2015-11-11 MED FILL — FLUTICASONE PROP 50 MCG SPR: 50 | 30 days supply | Qty: 16 | Fill #3

## 2015-12-03 ENCOUNTER — Ambulatory Visit: Payer: Medicaid - Out of State | Admitting: Family Medicine

## 2015-12-04 MED FILL — NOVOLIN 70/30 100 UNITS/ML: (70-30) 100 | 20 days supply | Qty: 10 | Fill #2

## 2015-12-21 ENCOUNTER — Other Ambulatory Visit: Payer: Self-pay | Admitting: Internal Medicine

## 2015-12-21 ENCOUNTER — Encounter: Payer: Self-pay | Admitting: Internal Medicine

## 2015-12-21 ENCOUNTER — Ambulatory Visit: Payer: Self-pay | Attending: Family Medicine | Admitting: Internal Medicine

## 2015-12-21 VITALS — BP 152/89 | HR 83 | Temp 98.3°F | Resp 16 | Ht 60.0 in | Wt 236.0 lb

## 2015-12-21 DIAGNOSIS — Z6841 Body Mass Index (BMI) 40.0 and over, adult: Secondary | ICD-10-CM | POA: Insufficient documentation

## 2015-12-21 DIAGNOSIS — I10 Essential (primary) hypertension: Secondary | ICD-10-CM | POA: Insufficient documentation

## 2015-12-21 DIAGNOSIS — Z794 Long term (current) use of insulin: Secondary | ICD-10-CM | POA: Insufficient documentation

## 2015-12-21 DIAGNOSIS — E119 Type 2 diabetes mellitus without complications: Secondary | ICD-10-CM | POA: Insufficient documentation

## 2015-12-21 DIAGNOSIS — Z7982 Long term (current) use of aspirin: Secondary | ICD-10-CM | POA: Insufficient documentation

## 2015-12-21 DIAGNOSIS — F329 Major depressive disorder, single episode, unspecified: Secondary | ICD-10-CM | POA: Insufficient documentation

## 2015-12-21 DIAGNOSIS — Z79899 Other long term (current) drug therapy: Secondary | ICD-10-CM | POA: Insufficient documentation

## 2015-12-21 LAB — GLUCOSE, POCT (MANUAL RESULT ENTRY): POC GLUCOSE: 251 mg/dL — AB (ref 70–99)

## 2015-12-21 LAB — POCT GLYCOSYLATED HEMOGLOBIN (HGB A1C): HEMOGLOBIN A1C: 10.3

## 2015-12-21 MED ORDER — INSULIN NPH ISOPHANE & REGULAR (70-30) 100 UNIT/ML ~~LOC~~ SUSP: 35.0000 [IU] | Freq: Two times a day (BID) | SUBCUTANEOUS | Status: DC

## 2015-12-21 MED ORDER — HYDROCHLOROTHIAZIDE 25 MG PO TABS: 25.0000 mg | ORAL_TABLET | Freq: Every day | ORAL | Status: DC

## 2015-12-21 MED ORDER — AMLODIPINE BESYLATE 10 MG PO TABS: 10.0000 mg | ORAL_TABLET | Freq: Every day | ORAL | Status: DC

## 2015-12-21 MED FILL — LISINOPRIL 40 MG TABLET: 40 | 30 days supply | Qty: 30 | Fill #4

## 2015-12-21 MED FILL — HYDROCHLOROTHIAZIDE 25 MG T: 25 | 30 days supply | Qty: 30 | Fill #0

## 2015-12-21 MED FILL — ?AMLODIPINE BESYLATE 10 MG: 10 | 30 days supply | Qty: 30 | Fill #0

## 2015-12-21 MED FILL — NOVOLIN 70/30 100 UNITS/ML: (70-30) 100 | 15 days supply | Qty: 10 | Fill #0

## 2015-12-21 NOTE — Progress Notes (Signed)
Pt here for F/U for HTN. Pt reports pain in right lower abdomen. Pt reports history of kidney stones. Pt requests a different medication other than glipizide. Pt also needs a refill on lasix. CBG is 251. A1C is 10.3

## 2015-12-21 NOTE — Patient Instructions (Addendum)
- check glucose at home before and after insulin and meals x 2 wks until [place], bring in book /numbers when visit [facility]pharm   Low-Sodium Eating Plan Sodium raises blood pressure and causes water to be held in the body. Getting less sodium from food will help lower your blood pressure, reduce any swelling, and protect your heart, liver, and kidneys. We get sodium by adding salt (sodium chloride) to food. Most of our sodium comes from canned, boxed, and frozen foods. Restaurant foods, fast foods, and pizza are also very high in sodium. Even if you take medicine to lower your blood pressure or to reduce fluid in your body, getting less sodium from your food is important. WHAT IS MY PLAN? Most people should limit their sodium intake to 2,300 mg a day. Your health care provider recommends that you limit your sodium intake to __________ a day.  WHAT DO I NEED TO KNOW ABOUT THIS EATING PLAN? For the low-sodium eating plan, you will follow these general guidelines:  Choose foods with a % Daily Value for sodium of less than 5% (as listed on the food label).   Use salt-free seasonings or herbs instead of table salt or sea salt.   Check with your health care provider or pharmacist before using salt substitutes.   Eat fresh foods.  Eat more vegetables and fruits.  Limit canned vegetables. If you do use them, rinse them well to decrease the sodium.   Limit cheese to 1 oz (28 g) per day.   Eat lower-sodium products, often labeled as "lower sodium" or "no salt added."  Avoid foods that contain monosodium glutamate (MSG). MSG is sometimes added to [place] food and some canned foods.  Check food labels (Nutrition Facts labels) on foods to learn how much sodium is in one serving.  Eat more home-cooked food and less restaurant, buffet, and fast food.  When eating at a restaurant, ask that your food be prepared with less salt, or no salt if possible.  HOW DO I READ FOOD  LABELS FOR SODIUM INFORMATION? The Nutrition Facts label lists the amount of sodium in one serving of the food. If you eat more than one serving, you must multiply the listed amount of sodium by the number of servings. Food labels may also identify foods as:  Sodium free--Less than 5 mg in a serving.  Very low sodium--35 mg or less in a serving.  Low sodium--140 mg or less in a serving.  Light in sodium--50% less sodium in a serving. For example, if a food that usually has 300 mg of sodium is changed to become light in sodium, it will have 150 mg of sodium.  Reduced sodium--25% less sodium in a serving. For example, if a food that usually has 400 mg of sodium is changed to reduced sodium, it will have 300 mg of sodium. WHAT FOODS CAN I EAT? Grains Low-sodium cereals, including oats, puffed wheat and rice, and shredded wheat cereals. Low-sodium crackers. Unsalted rice and pasta. Lower-sodium bread.  Vegetables Frozen or fresh vegetables. Low-sodium or reduced-sodium canned vegetables. Low-sodium or reduced-sodium tomato sauce and paste. Low-sodium or reduced-sodium tomato and vegetable juices.  Fruits Fresh, frozen, and canned fruit. Fruit juice.  Meat and Other Protein Products Low-sodium canned tuna and salmon. Fresh or frozen meat, poultry, seafood, and fish. Lamb. Unsalted nuts. Dried beans, peas, and lentils without added salt. Unsalted canned beans. Homemade soups without salt. Eggs.  Dairy Milk. Soy milk. Ricotta cheese. Low-sodium or reduced-sodium  cheeses. Yogurt.  Condiments Fresh and dried herbs and spices. Salt-free seasonings. Onion and garlic powders. Low-sodium varieties of mustard and ketchup. Fresh or refrigerated horseradish. Lemon juice.  Fats and Oils Reduced-sodium salad dressings. Unsalted butter.  Other Unsalted popcorn and pretzels.  The items listed above may not be a complete list of recommended foods or beverages. Contact your dietitian for more  options. WHAT FOODS ARE NOT RECOMMENDED? Grains Instant hot cereals. Bread stuffing, pancake, and biscuit mixes. Croutons. Seasoned rice or pasta mixes. Noodle soup cups. Boxed or frozen macaroni and cheese. Self-rising flour. Regular salted crackers. Vegetables Regular canned vegetables. Regular canned tomato sauce and paste. Regular tomato and vegetable juices. Frozen vegetables in sauces. Salted [place] fries. Olives. Angie Fava. Relishes. Sauerkraut. Salsa. Meat and Other Protein Products Salted, canned, smoked, spiced, or pickled meats, seafood, or fish. Bacon, ham, sausage, hot dogs, corned beef, chipped beef, and packaged luncheon meats. Salt pork. Jerky. Pickled herring. Anchovies, regular canned tuna, and sardines. Salted nuts. Dairy Processed cheese and cheese spreads. Cheese curds. Blue cheese and cottage cheese. Buttermilk.  Condiments Onion and garlic salt, seasoned salt, table salt, and sea salt. Canned and packaged gravies. Worcestershire sauce. Tartar sauce. Barbecue sauce. Teriyaki sauce. Soy sauce, including reduced sodium. Steak sauce. Fish sauce. Oyster sauce. Cocktail sauce. Horseradish that you find on the shelf. Regular ketchup and mustard. Meat flavorings and tenderizers. Bouillon cubes. Hot sauce. Tabasco sauce. Marinades. Taco seasonings. Relishes. Fats and Oils Regular salad dressings. Salted butter. Margarine. Ghee. Bacon fat.  Other Potato and tortilla chips. Corn chips and puffs. Salted popcorn and pretzels. Canned or dried soups. Pizza. Frozen entrees and pot pies.  The items listed above may not be a complete list of foods and beverages to avoid. Contact your dietitian for more information.   This information is not intended to replace advice given to you by your health care provider. Make sure you discuss any questions you have with your health care provider.   Document Released: 12/17/2001 Document Revised: 07/18/2014 Document Reviewed: 05/01/2013 Elsevier  Interactive Patient Education 2016 Elsevier Inc.  - Diabetes Mellitus and Food It is important for you to manage your blood sugar (glucose) level. Your blood glucose level can be greatly affected by what you eat. Eating healthier foods in the appropriate amounts throughout the day at about the same time each day will help you control your blood glucose level. It can also help slow or prevent worsening of your diabetes mellitus. Healthy eating may even help you improve the level of your blood pressure and reach or maintain a healthy weight.  General recommendations for healthful eating and cooking habits include:  Eating meals and snacks regularly. Avoid going long periods of time without eating to lose weight.  Eating a diet that consists mainly of plant-based foods, such as fruits, vegetables, nuts, legumes, and whole grains.  Using low-heat cooking methods, such as baking, instead of high-heat cooking methods, such as deep frying. Work with your dietitian to make sure you understand how to use the Nutrition Facts information on food labels. HOW CAN FOOD AFFECT ME? Carbohydrates Carbohydrates affect your blood glucose level more than any other type of food. Your dietitian will help you determine how many carbohydrates to eat at each meal and teach you how to count carbohydrates. Counting carbohydrates is important to keep your blood glucose at a healthy level, especially if you are using insulin or taking certain medicines for diabetes mellitus. Alcohol Alcohol can cause sudden decreases in blood glucose (hypoglycemia),  especially if you use insulin or take certain medicines for diabetes mellitus. Hypoglycemia can be a life-threatening condition. Symptoms of hypoglycemia (sleepiness, dizziness, and disorientation) are similar to symptoms of having too much alcohol.  If your health care provider has given you approval to drink alcohol, do so in moderation and use the following guidelines:  Women  should not have more than one drink per day, and men should not have more than two drinks per day. One drink is equal to:  12 oz of beer.  5 oz of wine.  1 oz of hard liquor.  Do not drink on an empty stomach.  Keep yourself hydrated. Have water, diet soda, or unsweetened iced tea.  Regular soda, juice, and other mixers might contain a lot of carbohydrates and should be counted. WHAT FOODS ARE NOT RECOMMENDED? As you make food choices, it is important to remember that all foods are not the same. Some foods have fewer nutrients per serving than other foods, even though they might have the same number of calories or carbohydrates. It is difficult to get your body what it needs when you eat foods with fewer nutrients. Examples of foods that you should avoid that are high in calories and carbohydrates but low in nutrients include:  Trans fats (most processed foods list trans fats on the Nutrition Facts label).  Regular soda.  Juice.  Candy.  Sweets, such as cake, pie, doughnuts, and cookies.  Fried foods. WHAT FOODS CAN I EAT? Eat nutrient-rich foods, which will nourish your body and keep you healthy. The food you should eat also will depend on several factors, including:  The calories you need.  The medicines you take.  Your weight.  Your blood glucose level.  Your blood pressure level.  Your cholesterol level. You should eat a variety of foods, including:  Protein.  Lean cuts of meat.  Proteins low in saturated fats, such as fish, egg whites, and beans. Avoid processed meats.  Fruits and vegetables.  Fruits and vegetables that may help control blood glucose levels, such as apples, mangoes, and yams.  Dairy products.  Choose fat-free or low-fat dairy products, such as milk, yogurt, and cheese.  Grains, bread, pasta, and rice.  Choose whole grain products, such as multigrain bread, whole oats, and brown rice. These foods may help control blood  pressure.  Fats.  Foods containing healthful fats, such as nuts, avocado, olive oil, canola oil, and fish. DOES EVERYONE WITH DIABETES MELLITUS HAVE THE SAME MEAL PLAN? Because every person with diabetes mellitus is different, there is not one meal plan that works for everyone. It is very important that you meet with a dietitian who will help you create a meal plan that is just right for you.   This information is not intended to replace advice given to you by your health care provider. Make sure you discuss any questions you have with your health care provider.   Document Released: 03/24/2005 Document Revised: 07/18/2014 Document Reviewed: 05/24/2013 Elsevier Interactive Patient Education 2016 [employer].  - Diabetes and Exercise Exercising regularly is important. It is not just about losing weight. It has many health benefits, such as:  Improving your overall fitness, flexibility, and endurance.  Increasing your bone density.  Helping with weight control.  Decreasing your body fat.  Increasing your muscle strength.  Reducing stress and tension.  Improving your overall health. People with diabetes who exercise gain additional benefits because exercise:  Reduces appetite.  Improves the body's use of  blood sugar (glucose).  Helps lower or control blood glucose.  Decreases blood pressure.  Helps control blood lipids (such as cholesterol and triglycerides).  Improves the body's use of the hormone insulin by:  Increasing the body's insulin sensitivity.  Reducing the body's insulin needs.  Decreases the risk for heart disease because exercising:  Lowers cholesterol and triglycerides levels.  Increases the levels of good cholesterol (such as high-density lipoproteins [HDL]) in the body.  Lowers blood glucose levels. YOUR ACTIVITY PLAN  Choose an activity that you enjoy, and set realistic goals. To exercise safely, you should begin practicing any new physical  activity slowly, and gradually increase the intensity of the exercise over time. Your health care provider or diabetes educator can help create an activity plan that works for you. General recommendations include:  Encouraging children to engage in at least 60 minutes of physical activity each day.  Stretching and performing strength training exercises, such as yoga or weight lifting, at least 2 times per week.  Performing a total of at least 150 minutes of moderate-intensity exercise each week, such as brisk walking or water aerobics.  Exercising at least 3 days per week, making sure you allow no more than 2 consecutive days to pass without exercising.  Avoiding long periods of inactivity (90 minutes or more). When you have to spend an extended period of time sitting down, take frequent breaks to walk or stretch. RECOMMENDATIONS FOR EXERCISING WITH TYPE 1 OR TYPE 2 DIABETES   Check your blood glucose before exercising. If blood glucose levels are greater than 240 mg/dL, check for urine ketones. Do not exercise if ketones are present.  Avoid injecting insulin into areas of the body that are going to be exercised. For example, avoid injecting insulin into:  The arms when playing tennis.  The legs when jogging.  Keep a record of:  Food intake before and after you exercise.  Expected peak times of insulin action.  Blood glucose levels before and after you exercise.  The type and amount of exercise you have done.  Review your records with your health care provider. Your health care provider will help you to develop guidelines for adjusting food intake and insulin amounts before and after exercising.  If you take insulin or oral hypoglycemic agents, watch for signs and symptoms of hypoglycemia. They include:  Dizziness.  Shaking.  Sweating.  Chills.  Confusion.  Drink plenty of water while you exercise to prevent dehydration or heat stroke. Body water is lost during exercise  and must be replaced.  Talk to your health care provider before starting an exercise program to make sure it is safe for you. Remember, almost any type of activity is better than none.   This information is not intended to replace advice given to you by your health care provider. Make sure you discuss any questions you have with your health care provider.   Document Released: 09/17/2003 Document Revised: 11/11/2014 Document Reviewed: 12/04/2012 Elsevier Interactive Patient Education 2016 Elsevier Inc.   - Diabetes and Foot Care Diabetes may cause you to have problems because of poor blood supply (circulation) to your feet and legs. This may cause the skin on your feet to become thinner, break easier, and heal more slowly. Your skin may become dry, and the skin may peel and crack. You may also have nerve damage in your legs and feet causing decreased feeling in them. You may not notice minor injuries to your feet that could lead to infections or  more serious problems. Taking care of your feet is one of the most important things you can do for yourself.  HOME CARE INSTRUCTIONS  Wear shoes at all times, even in the house. Do not go barefoot. Bare feet are easily injured.  Check your feet daily for blisters, cuts, and redness. If you cannot see the bottom of your feet, use a mirror or ask someone for help.  Wash your feet with warm water (do not use hot water) and mild soap. Then pat your feet and the areas between your toes until they are completely dry. Do not soak your feet as this can dry your skin.  Apply a moisturizing lotion or petroleum jelly (that does not contain alcohol and is unscented) to the skin on your feet and to dry, brittle toenails. Do not apply lotion between your toes.  Trim your toenails straight across. Do not dig under them or around the cuticle. File the edges of your nails with an emery board or nail file.  Do not cut corns or calluses or try to remove them with  medicine.  Wear clean socks or stockings every day. Make sure they are not too tight. Do not wear knee-high stockings since they may decrease blood flow to your legs.  Wear shoes that fit properly and have enough cushioning. To break in new shoes, wear them for just a few hours a day. This prevents you from injuring your feet. Always look in your shoes before you put them on to be sure there are no objects inside.  Do not cross your legs. This may decrease the blood flow to your feet.  If you find a minor scrape, cut, or break in the skin on your feet, keep it and the skin around it clean and dry. These areas may be cleansed with mild soap and water. Do not cleanse the area with peroxide, alcohol, or iodine.  When you remove an adhesive bandage, be sure not to damage the skin around it.  If you have a wound, look at it several times a day to make sure it is healing.  Do not use heating pads or hot water bottles. They may burn your skin. If you have lost feeling in your feet or legs, you may not know it is happening until it is too late.  Make sure your health care provider performs a complete foot exam at least annually or more often if you have foot problems. Report any cuts, sores, or bruises to your health care provider immediately. SEEK MEDICAL CARE IF:   You have an injury that is not healing.  You have cuts or breaks in the skin.  You have an ingrown nail.  You notice redness on your legs or feet.  You feel burning or tingling in your legs or feet.  You have pain or cramps in your legs and feet.  Your legs or feet are numb.  Your feet always feel cold. SEEK IMMEDIATE MEDICAL CARE IF:   There is increasing redness, swelling, or pain in or around a wound.  There is a red line that goes up your leg.  Pus is coming from a wound.  You develop a fever or as directed by your health care provider.  You notice a bad smell coming from an ulcer or wound.   This information is  not intended to replace advice given to you by your health care provider. Make sure you discuss any questions you have with your health care  provider.   Document Released: 06/24/2000 Document Revised: 02/27/2013 Document Reviewed: 12/04/2012 Elsevier Interactive Patient Education [employer].

## 2015-12-21 NOTE — Progress Notes (Signed)
Vanessa [name], is a 60 y.o. female  FAO:[id number]  ONG:[id number]  DOB - 07/17/1955  Chief Complaint  Patient presents with  . Follow-up    HTN        Subjective:   Vanessa [name] is a 60 y.o. female here today for a follow up visit of dm, htn, hld.  Last visit w/ NP on 08/03/15.  Since than, admits to diet noncompliance.  Her husband does most of cooking, and he fries/cooks w/ salt, since he is not on a salt restricted diet.  She also has been c/o of intermittent bilateral lower extremity swelling as well.  She does not smoke or drink.    Patient has No headache, No chest pain, No abdominal pain - No Nausea, No new weakness tingling or numbness, No Cough - SOB.   Has appt this afternoon w/ financial services per pt. No problems updated.  ALLERGIES: No Known Allergies  PAST MEDICAL HISTORY: Past Medical History  Diagnosis Date  . Asthma   . Diabetes mellitus without complication ([place])   . Hypertension   . Heart murmur     MEDICATIONS AT HOME: Prior to Admission medications   Medication Sig Start Date End Date Taking? Authorizing Provider  amLODipine (NORVASC) 10 MG tablet TAKE 1 TABLET BY MOUTH DAILY. 07/31/15  Yes Lance Bosch, NP  aspirin 81 MG tablet Take 1 tablet (81 mg total) by mouth daily. 05/23/14  Yes Lance Bosch, NP  atorvastatin (LIPITOR) 40 MG tablet TAKE 1 TABLET BY MOUTH DAILY. 07/31/15  Yes Lance Bosch, NP  Blood Glucose Monitoring Suppl (TRUE METRIX METER) w/Device KIT Check sugar before meals for E11.9 08/03/15  Yes Lance Bosch, NP  fluticasone (FLONASE) 50 MCG/ACT nasal spray Place 2 sprays into both nostrils daily. 07/24/15  Yes Lance Bosch, NP  furosemide (LASIX) 20 MG tablet Take 1 tablet (20 mg total) by mouth daily. 08/03/15  Yes Lance Bosch, NP  glipiZIDE (GLUCOTROL XL) 5 MG 24 hr tablet Take 1 tablet (5 mg total) by mouth daily with breakfast. 08/03/15  Yes Lance Bosch, NP  glucose blood (TRUE METRIX BLOOD GLUCOSE TEST) test strip  Check sugar before meals for E11.9 08/03/15  Yes Lance Bosch, NP  insulin NPH-regular Human (NOVOLIN 70/30) (70-30) 100 UNIT/ML injection Inject 35 Units into the skin 2 (two) times daily with a meal. 12/21/15  Yes Maren Reamer, MD  INSULIN SYRINGE 1CC/29G 29G X 1/2" 1 ML MISC Insulin syringes 03/06/14  Yes Courtney Forcucci, PA-C  lisinopril (PRINIVIL,ZESTRIL) 40 MG tablet Take 1 tablet (40 mg total) by mouth daily. 08/03/15  Yes Lance Bosch, NP  loratadine (CLARITIN) 10 MG tablet Take 1 tablet (10 mg total) by mouth daily. 07/24/15  Yes Lance Bosch, NP  TRUEPLUS LANCETS 28G MISC Dispense 100 count lancets 03/23/15  Yes Lance Bosch, NP  TRUEPLUS LANCETS 28G MISC Check sugar before meals for E11.9 08/03/15  Yes Lance Bosch, NP  hydrochlorothiazide (HYDRODIURIL) 25 MG tablet Take 1 tablet (25 mg total) by mouth daily. 12/21/15   Maren Reamer, MD     Objective:   Filed Vitals:   12/21/15 1446 12/21/15 1448  BP:  152/89  Pulse:  83  Temp:  98.3 F (36.8 C)  TempSrc:  Oral  Resp:  16  Height: 5' (1.524 m)   Weight: 236 lb (107.049 kg)   SpO2:  95%    Exam General appearance : Awake, alert, not  in any distress. Speech Clear. Not toxic looking, morbid obese, pleasant. HEENT: Atraumatic and Normocephalic, pupils equally reactive to light. Neck: supple, no JVD.  Chest:Good air entry bilaterally, no added sounds. CVS: S1 S2 regular, no murmurs/gallups or rubs. Abdomen: Bowel sounds active, Non tender and not distended with no gaurding, rigidity or rebound. Extremities: B/L 1+ le edema., both legs are warm to touch Neurology: Awake alert, and oriented X 3, CN II-XII grossly intact, Non focal Skin:No Rash  Data Review Lab Results  Component Value Date   HGBA1C 9.60 08/03/2015   HGBA1C 14.7 03/23/2015   HGBA1C 8.7 05/23/2014    Depression screen PHQ 2/9 12/21/2015 03/23/2015 04/06/2014 03/10/2014  Decreased Interest 2 1 0 0  Down, Depressed, Hopeless 2 1 1  0  PHQ - 2  Score 4 2 1  0  Altered sleeping 3 3 - -  Tired, decreased energy 3 3 - -  Change in appetite 3 3 - -  Feeling bad or failure about yourself  3 2 - -  Trouble concentrating 2 2 - -  Moving slowly or fidgety/restless 3 1 - -  Suicidal thoughts 3 0 - -  PHQ-9 Score 24 16 - -      Assessment & Plan   1. Type 2 diabetes mellitus without complication, unspecified long term insulin use status (HCC) -uncontrolled., a1c 10.3, glu 251 - insulin NPH-regular Human (NOVOLIN 70/30) (70-30) 100 UNIT/ML injection; Inject 35 Units into the skin 2 (two) times daily with a meal.  Dispense: 10 mL; Refill: 3 - continue glipizide 5 daily - unable to tol metformin - low carb diet discussed  2. Essential hypertension - Uncontrolled - on lisinopril 40 qday, norvasc 38m qday. - added hctz 25 qday. - discussed low salt diet, recd she tell her husband to not cook w/ salt, and he can add it to his food afterwards.  3. Morbid obesity, unspecified obesity type ([place] bmi 46. - encouraged diet /exercise extensively.  4. depression Appears in good spirits though, no si/hi/avh. Consider antidepressant if pt wants next appt.  5. Financial asisitance pending appt today.  6. Health maintenance Due for mm, colonoscopy, papsmear, but no insurance at this time, wants to defer until apply for orange card/cone discount.  Patient have been counseled extensively about nutrition and exercise  Return in about 3 months (around 03/22/2016), or if symptoms worsen or fail to improve, for dm/htn.  The patient was given clear instructions to go to ER or return to medical center if symptoms don't improve, worsen or new problems develop. The patient verbalized understanding. The patient was told to call to get lab results if they haven't heard anything in the next week.    DMaren Reamer MD, MCom[facility] Northwest[facility] WMid[facility] [place]  12/21/2015, 5:20 PM

## 2016-01-07 ENCOUNTER — Ambulatory Visit: Payer: Self-pay | Attending: Internal Medicine | Admitting: Pharmacist

## 2016-01-07 ENCOUNTER — Encounter: Payer: Self-pay | Admitting: Pharmacist

## 2016-01-07 VITALS — BP 146/79 | HR 80

## 2016-01-07 DIAGNOSIS — Z794 Long term (current) use of insulin: Secondary | ICD-10-CM

## 2016-01-07 DIAGNOSIS — E119 Type 2 diabetes mellitus without complications: Secondary | ICD-10-CM

## 2016-01-07 MED ORDER — GLUCOSE BLOOD VI STRP: ORAL_STRIP | Status: DC

## 2016-01-07 MED FILL — !NOVOLIN 70/30 100 UNITS/ML: (70-30) 100 | 15 days supply | Qty: 10 | Fill #1

## 2016-01-07 MED FILL — TRUE METRIX TEST STRIP: 30 days supply | Qty: 100 | Fill #0

## 2016-01-07 NOTE — Progress Notes (Signed)
    S:    Patient arrives in good spirits.  Presents for diabetes evaluation, education, and management at the request of Dr. Janne Napoleon. Patient was referred on 12/21/15.  Patient was last seen by Primary Care Provider on 12/21/15.   Patient reports adherence with medications.  Current diabetes medications include: glipizide 5 mg daily with breakfast and Novolin 70/30 35 units BID. Current hypertension medications include:   Patient denies hypoglycemic events.  Patient reported exercise habits: none   Patient denies nocturia.  Patient denies neuropathy. Patient denies visual changes. Patient reports self foot exams.   Patient reports checking her blood glucose about 30 minutes after she eats.   O:  Lab Results  Component Value Date   HGBA1C 10.3 12/21/2015   Filed Vitals:   01/07/16 1459  BP: 146/79  Pulse: 80    Home fasting CBG: none 2 hour post-prandial/random CBG: none Random CBGs (usually 30 mins after eating): <230  A/P: Diabetes longstanding currently uncontrolled based on A1c of 10.3. Patient denies hypoglycemic events and is able to verbalize appropriate hypoglycemia management plan. Patient reports adherence with medication. Control is suboptimal due to sedentary lifestyle.  Continued glipizide 5 mg daily and Novolin 70/30 35 units BID. Patient to get fasting readings and 2 hour post prandial blood glucose readings to help [place] better assess what her blood glucose control is at home. Would like to stop the glipizide at some point and titrate up on the Novolin. Patient is open to trying metformin XL in the future to avoid GI upset - will assess when more blood glucose readings are available.   Next A1C anticipated September 2017.    Written patient instructions provided.  Total time in face to face counseling 20 minutes.   Follow up in Pharmacist Clinic Visit in 2 weeks.   Patient seen with Myrna Blazer, PharmD Candidate

## 2016-01-07 NOTE — Patient Instructions (Signed)
Thanks for coming to see [place].  Come back in 2-3 weeks with blood sugar meter  Get blood sugar readings first thing in the morning before you eat and then 2 hours after you eat  Blood Glucose Monitoring, Adult Monitoring your blood glucose (also know as blood sugar) helps you to manage your diabetes. It also helps you and your health care provider monitor your diabetes and determine how well your treatment plan is working. WHY SHOULD YOU MONITOR YOUR BLOOD GLUCOSE?  It can help you understand how food, exercise, and medicine affect your blood glucose.  It allows you to know what your blood glucose is at any given moment. You can quickly tell if you are having low blood glucose (hypoglycemia) or high blood glucose (hyperglycemia).  It can help you and your health care provider know how to adjust your medicines.  It can help you understand how to manage an illness or adjust medicine for exercise. WHEN SHOULD YOU TEST? Your health care provider will help you decide how often you should check your blood glucose. This may depend on the type of diabetes you have, your diabetes control, or the types of medicines you are taking. Be sure to write down all of your blood glucose readings so that this information can be reviewed with your health care provider. See below for examples of testing times that your health care provider may suggest. Type 1 Diabetes  Test at least 2 times per day if your diabetes is well controlled, if you are using an insulin pump, or if you perform multiple daily injections.  If your diabetes is not well controlled or if you are sick, you may need to test more often.  It is a good idea to also test:  Before every insulin injection.  Before and after exercise.  Between meals and 2 hours after a meal.  Occasionally between 2:00 a.m. and 3:00 a.m. Type 2 Diabetes  If you are taking insulin, test at least 2 times per day. However, it is best to test before every insulin  injection.  If you take medicines by mouth (orally), test 2 times a day.  If you are on a controlled diet, test once a day.  If your diabetes is not well controlled or if you are sick, you may need to monitor more often. HOW TO MONITOR YOUR BLOOD GLUCOSE Supplies Needed  Blood glucose meter.  Test strips for your meter. Each meter has its own strips. You must use the strips that go with your own meter.  A pricking needle (lancet).  A device that holds the lancet (lancing device).  A journal or log book to write down your results. Procedure  Wash your hands with soap and water. Alcohol is not preferred.  Prick the side of your finger (not the tip) with the lancet.  Gently milk the finger until a small drop of blood appears.  Follow the instructions that come with your meter for inserting the test strip, applying blood to the strip, and using your blood glucose meter. Other Areas to Get Blood for Testing Some meters allow you to use other areas of your body (other than your finger) to test your blood. These areas are called alternative sites. The most common alternative sites are:  The forearm.  The thigh.  The back area of the lower leg.  The palm of the hand. The blood flow in these areas is slower. Therefore, the blood glucose values you get may be delayed, and  the numbers are different from what you would get from your fingers. Do not use alternative sites if you think you are having hypoglycemia. Your reading will not be accurate. Always use a finger if you are having hypoglycemia. Also, if you cannot feel your lows (hypoglycemia unawareness), always use your fingers for your blood glucose checks. ADDITIONAL TIPS FOR GLUCOSE MONITORING  Do not reuse lancets.  Always carry your supplies with you.  All blood glucose meters have a 24-hour "hotline" number to call if you have questions or need help.  Adjust (calibrate) your blood glucose meter with a control solution  after finishing a few boxes of strips. BLOOD GLUCOSE RECORD KEEPING It is a good idea to keep a daily record or log of your blood glucose readings. Most glucose meters, if not all, keep your glucose records stored in the meter. Some meters come with the ability to download your records to your home computer. Keeping a record of your blood glucose readings is especially helpful if you are wanting to look for patterns. Make notes to go along with the blood glucose readings because you might forget what happened at that exact time. Keeping good records helps you and your health care provider to work together to achieve good diabetes management.    This information is not intended to replace advice given to you by your health care provider. Make sure you discuss any questions you have with your health care provider.   Document Released: 06/30/2003 Document Revised: 07/18/2014 Document Reviewed: 11/19/2012 Elsevier Interactive Patient Education [employer].

## 2016-01-28 ENCOUNTER — Ambulatory Visit: Payer: Self-pay | Attending: Internal Medicine | Admitting: Pharmacist

## 2016-01-28 ENCOUNTER — Encounter: Payer: Self-pay | Admitting: Pharmacist

## 2016-01-28 VITALS — BP 139/84 | HR 80

## 2016-01-28 DIAGNOSIS — I10 Essential (primary) hypertension: Secondary | ICD-10-CM

## 2016-01-28 DIAGNOSIS — Z794 Long term (current) use of insulin: Secondary | ICD-10-CM

## 2016-01-28 DIAGNOSIS — E119 Type 2 diabetes mellitus without complications: Secondary | ICD-10-CM

## 2016-01-28 MED ORDER — LISINOPRIL 40 MG PO TABS: 40.0000 mg | ORAL_TABLET | Freq: Every day | ORAL | Status: DC

## 2016-01-28 MED ORDER — ATORVASTATIN CALCIUM 40 MG PO TABS: 40.0000 mg | ORAL_TABLET | Freq: Every day | ORAL | Status: DC

## 2016-01-28 MED ORDER — GLIPIZIDE ER 5 MG PO TB24: 5.0000 mg | ORAL_TABLET | Freq: Every day | ORAL | Status: DC

## 2016-01-28 MED ORDER — INSULIN NPH ISOPHANE & REGULAR (70-30) 100 UNIT/ML ~~LOC~~ SUSP: 38.0000 [IU] | Freq: Two times a day (BID) | SUBCUTANEOUS | Status: DC

## 2016-01-28 MED FILL — !NOVOLIN 70/30 100 UNITS/ML: (70-30) 100 | 26 days supply | Qty: 20 | Fill #0

## 2016-01-28 MED FILL — HYDROCHLOROTHIAZIDE 25 MG T: 25 | 30 days supply | Qty: 30 | Fill #1

## 2016-01-28 MED FILL — ?ATORVASTATIN 40MG TABLET: 40 | 30 days supply | Qty: 30 | Fill #0

## 2016-01-28 MED FILL — ?LISINOPRIL 40 MG TABLET: 40 MG | 30 days supply | Qty: 30 | Fill #0

## 2016-01-28 MED FILL — glipiZIDE XL 5 MG TB24: 5 | 30 days supply | Qty: 30 | Fill #0

## 2016-01-28 NOTE — Patient Instructions (Addendum)
Thanks for coming to see [place]!  Increase insulin to 38 units twice a day  Continue checking the fasting blood sugar before you eat breakfast  Hypoglycemia Low blood sugar (hypoglycemia) means that the level of sugar in your blood is lower than it should be. Signs of low blood sugar include:  Getting sweaty.  Feeling hungry.  Feeling dizzy or weak.  Feeling sleepier than normal.  Feeling nervous.  Headaches.  Having a fast heartbeat. Low blood sugar can happen fast and can be an emergency. Your doctor can do tests to check your blood sugar level. You can have low blood sugar and not have diabetes. HOME CARE  Check your blood sugar as told by your doctor. If it is less than 70 mg/dl or as told by your doctor, take 1 of the following:  3 to 4 glucose tablets.   cup clear juice.   cup soda pop, not diet.  1 cup milk.  5 to 6 hard candies.  Recheck blood sugar after 15 minutes. Repeat until it is at the right level.  Eat a snack if it is more than 1 hour until the next meal.  Only take medicine as told by your doctor.  Do not skip meals. Eat on time.  Do not drink alcohol except with meals.  Check your blood glucose before driving.  Check your blood glucose before and after exercise.  Always carry treatment with you, such as glucose pills.  Always wear a medical alert bracelet if you have diabetes. GET HELP RIGHT AWAY IF:   Your blood glucose goes below 70 mg/dl or as told by your doctor, and you:  Are confused.  Are not able to swallow.  Pass out (faint).  You cannot treat yourself. You may need someone to help you.  You have low blood sugar problems often.  You have problems from your medicines.  You are not feeling better after 3 to 4 days.  You have vision changes. MAKE SURE YOU:   Understand these instructions.  Will watch this condition.  Will get help right away if you are not doing well or get worse.   This information is not  intended to replace advice given to you by your health care provider. Make sure you discuss any questions you have with your health care provider.   Document Released: 09/21/2009 Document Revised: 07/18/2014 Document Reviewed: 03/03/2015 Elsevier Interactive Patient Education [employer].

## 2016-01-28 NOTE — Progress Notes (Signed)
    S:    Patient arrives in good spirits.  Presents for diabetes evaluation, education, and management at the request of Dr. Janne Napoleon. Patient was referred on 12/21/15.  Patient was last seen by Primary Care Provider on 12/21/15.   Patient reports adherence with medications.  Current diabetes medications include: glipizide 5 mg daily with breakfast and Novolin 70/30 35 units BID. Current hypertension medications include: amlodipine 10 mg daily, HCTZ 25 mg daily, lisinopril 40 mg daily.   Patient denies hypoglycemic events.  Patient reported exercise habits: none   Patient denies nocturia.  Patient denies neuropathy. Patient denies visual changes. Patient reports self foot exams.    O:  Lab Results  Component Value Date   HGBA1C 10.3 12/21/2015   There were no vitals filed for this visit.  Home fasting CBG: 150-200 2 hour post-prandial/random CBG: 165-225   A/P: Diabetes longstanding currently uncontrolled based on A1c of 10.3. Patient denies hypoglycemic events and is able to verbalize appropriate hypoglycemia management plan. Patient reports adherence with medication. Control is suboptimal due to sedentary lifestyle.  Continued glipizide 5 mg daily and increased Novolin 70/30 to 38 units BID. Patient to continue to monitor fastings and post-prandials. Congratulated patient on progress. Will still consider addition of metformin in the future once insulin titration is complete.   Next A1C anticipated September 2017.    Written patient instructions provided.  Total time in face to face counseling 20 minutes.   Follow up in Pharmacist Clinic Visit in 2 weeks.   Patient seen with Joellyn Haff, PharmD Candidate

## 2016-02-11 ENCOUNTER — Ambulatory Visit (HOSPITAL_BASED_OUTPATIENT_CLINIC_OR_DEPARTMENT_OTHER): Payer: Self-pay | Admitting: Pharmacist

## 2016-02-11 DIAGNOSIS — E119 Type 2 diabetes mellitus without complications: Secondary | ICD-10-CM

## 2016-02-11 MED ORDER — INSULIN NPH ISOPHANE & REGULAR (70-30) 100 UNIT/ML ~~LOC~~ SUSP: SUBCUTANEOUS | 3 refills | Status: DC

## 2016-02-11 MED FILL — ?AMLODIPINE BESYLATE 10 MG: 10 | 30 days supply | Qty: 30 | Fill #1

## 2016-02-11 MED FILL — TRUE METRIX TEST STRIP: 30 days supply | Qty: 100 | Fill #1

## 2016-02-11 NOTE — Progress Notes (Signed)
    S:    Patient arrives in good spirits.  Presents for diabetes evaluation, education, and management at the request of Dr. Janne Napoleon. Patient was referred on 12/21/15.  Patient was last seen by Primary Care Provider on 12/21/15.   Patient reports adherence with medications.  Current diabetes medications include: Novolin 70/30 38 units BID. It was discovered today that the patient is not taking her glipizide because she is unable to crush it. She crushes all of her medications.   Patient denies hypoglycemic events.  Patient reported dietary habits: she has cut back on her soda intake but continues to have 1 soda and 1 sweet tea a day. She is trying to eat healthier but it is hard because her husband does not follow the same diet. She is trying to get him to eat healthier since his cholesterol is high.   Patient reported exercise habits: none   Patient denies nocturia.  Patient denies neuropathy. Patient denies visual changes. Patient reports self foot exams.    O:  Lab Results  Component Value Date   HGBA1C 10.3 12/21/2015   There were no vitals filed for this visit.  Home fasting CBG: 130-200 2 hour post-prandial/random CBG: 165-225   A/P: Diabetes longstanding currently uncontrolled based on A1c of 10.3. Patient denies hypoglycemic events and is able to verbalize appropriate hypoglycemia management plan. Patient reports adherence with medication. Control is suboptimal due to sedentary lifestyle.  Increased Novolin 70/30 to 44 units every morning and 38 units every evening. Patient to continue to monitor fastings and post-prandials. Metformin XL is not an option as patient is unable to crush it and she cannot tolerate IR. Removed glipizide from her medication list. Encouraged patient to continue lifestyle modifications.   Next A1C anticipated September 2017.    Written patient instructions provided. Total time in face to face counseling 20 minutes.   Follow up in Pharmacist  Clinic Visit in 2 weeks. Patient seen with Shellee Milo, PharmD Candidate

## 2016-02-11 NOTE — Patient Instructions (Addendum)
Thanks for coming to see [name]!  Increase your insulin morning dose - 44 units in the morning. Continue 38 units in the evening.  Tell the pharmacy you need refills on amlodipine and test strips - you have plenty of refills there.  Come back and see either me or Dr. Janne Napoleon in 2 weeks.

## 2016-02-24 MED FILL — !NOVOLIN 70/30 100 UNITS/ML: (70-30) 100 | 24 days supply | Qty: 20 | Fill #0

## 2016-03-16 MED FILL — glipiZIDE XL 5 MG TB24: 5 | 30 days supply | Qty: 30 | Fill #1 | Status: TO

## 2016-03-16 MED FILL — HYDROCHLOROTHIAZIDE 25 MG T: 25 | 30 days supply | Qty: 30 | Fill #2

## 2016-03-16 MED FILL — ?LISINOPRIL 40 MG TABLET: 40 MG | 30 days supply | Qty: 30 | Fill #1

## 2016-03-16 MED FILL — ?ATORVASTATIN 40MG TABLET: 40 | 30 days supply | Qty: 30 | Fill #1

## 2016-03-16 MED FILL — AMLODIPINE BESYLATE 10 MG T: 10 | 30 days supply | Qty: 30 | Fill #2

## 2016-03-16 MED FILL — $novoLIN 70/30 100 UNITS/ML: (70-30) 100 | 24 days supply | Qty: 20 | Fill #1

## 2016-03-24 ENCOUNTER — Ambulatory Visit: Payer: Self-pay | Attending: Internal Medicine | Admitting: Internal Medicine

## 2016-03-24 ENCOUNTER — Encounter: Payer: Self-pay | Admitting: Internal Medicine

## 2016-03-24 VITALS — BP 133/84 | HR 75 | Temp 98.1°F | Resp 16 | Wt 237.0 lb

## 2016-03-24 DIAGNOSIS — Z23 Encounter for immunization: Secondary | ICD-10-CM | POA: Insufficient documentation

## 2016-03-24 DIAGNOSIS — J45909 Unspecified asthma, uncomplicated: Secondary | ICD-10-CM | POA: Insufficient documentation

## 2016-03-24 DIAGNOSIS — Z79899 Other long term (current) drug therapy: Secondary | ICD-10-CM | POA: Insufficient documentation

## 2016-03-24 DIAGNOSIS — Z6841 Body Mass Index (BMI) 40.0 and over, adult: Secondary | ICD-10-CM | POA: Insufficient documentation

## 2016-03-24 DIAGNOSIS — E118 Type 2 diabetes mellitus with unspecified complications: Secondary | ICD-10-CM | POA: Insufficient documentation

## 2016-03-24 DIAGNOSIS — E119 Type 2 diabetes mellitus without complications: Secondary | ICD-10-CM

## 2016-03-24 DIAGNOSIS — I10 Essential (primary) hypertension: Secondary | ICD-10-CM | POA: Insufficient documentation

## 2016-03-24 DIAGNOSIS — E785 Hyperlipidemia, unspecified: Secondary | ICD-10-CM | POA: Insufficient documentation

## 2016-03-24 DIAGNOSIS — Z7982 Long term (current) use of aspirin: Secondary | ICD-10-CM | POA: Insufficient documentation

## 2016-03-24 DIAGNOSIS — Z7951 Long term (current) use of inhaled steroids: Secondary | ICD-10-CM | POA: Insufficient documentation

## 2016-03-24 DIAGNOSIS — Z794 Long term (current) use of insulin: Secondary | ICD-10-CM | POA: Insufficient documentation

## 2016-03-24 LAB — CBC WITH DIFFERENTIAL/PLATELET
Basophils Absolute: 63 cells/uL (ref 0–200)
Basophils Relative: 1 %
EOS ABS: 126 {cells}/uL (ref 15–500)
Eosinophils Relative: 2 %
HEMATOCRIT: 35.2 % (ref 35.0–45.0)
HEMOGLOBIN: 11.7 g/dL (ref 11.7–15.5)
LYMPHS ABS: 2520 {cells}/uL (ref 850–3900)
LYMPHS PCT: 40 %
MCH: 29 pg (ref 27.0–33.0)
MCHC: 33.2 g/dL (ref 32.0–36.0)
MCV: 87.3 fL (ref 80.0–100.0)
MONO ABS: 378 {cells}/uL (ref 200–950)
MPV: 9 fL (ref 7.5–12.5)
Monocytes Relative: 6 %
NEUTROS PCT: 51 %
Neutro Abs: 3213 cells/uL (ref 1500–7800)
Platelets: 341 10*3/uL (ref 140–400)
RBC: 4.03 MIL/uL (ref 3.80–5.10)
RDW: 15.7 % — ABNORMAL HIGH (ref 11.0–15.0)
WBC: 6.3 10*3/uL (ref 3.8–10.8)

## 2016-03-24 LAB — POCT GLYCOSYLATED HEMOGLOBIN (HGB A1C): HEMOGLOBIN A1C: 8.3

## 2016-03-24 LAB — TSH: TSH: 1.66 mIU/L

## 2016-03-24 LAB — GLUCOSE, POCT (MANUAL RESULT ENTRY): POC GLUCOSE: 197 mg/dL — AB (ref 70–99)

## 2016-03-24 MED ORDER — ATORVASTATIN CALCIUM 40 MG PO TABS: 40.0000 mg | ORAL_TABLET | Freq: Every day | ORAL | 3 refills | Status: DC

## 2016-03-24 MED ORDER — AMLODIPINE BESYLATE 10 MG PO TABS: 10.0000 mg | ORAL_TABLET | Freq: Every day | ORAL | 3 refills | Status: DC

## 2016-03-24 MED ORDER — LISINOPRIL 40 MG PO TABS: 40.0000 mg | ORAL_TABLET | Freq: Every day | ORAL | 3 refills | Status: DC

## 2016-03-24 MED ORDER — INSULIN NPH ISOPHANE & REGULAR (70-30) 100 UNIT/ML ~~LOC~~ SUSP: SUBCUTANEOUS | 3 refills | Status: DC

## 2016-03-24 MED ORDER — ASPIRIN 81 MG PO TABS: 81.0000 mg | ORAL_TABLET | Freq: Every day | ORAL | 3 refills | Status: DC

## 2016-03-24 MED ORDER — HYDROCHLOROTHIAZIDE 25 MG PO TABS
25.0000 mg | ORAL_TABLET | Freq: Every day | ORAL | 3 refills | Status: DC
Start: 2016-03-24 — End: 2016-08-15

## 2016-03-24 NOTE — Patient Instructions (Addendum)
QUICK START PATIENT GUIDE TO LCHF/IF LOW CARB HIGH FAT / INTERMITTENT FASTING What is this diet and how does it work? o Insulin is a hormone made by your body that allows you to use sugar (glucose) from carbohydrates in the food you eat for energy or to store glucose (as fat) for future use  o Insulin levels need to be lowered in order to utilize our stored energy (fat) o Many struggling with obesity are insulin resistant and have high levels of insulin o This diet works to lower your insulin in two ways o Fasting - allows your insulin levels to naturally decrease  o Avoiding carbohydrates - carbs trigger increase in insulin Low Carb Healthy Fat (LCHF) o Get a free app for your phone, such as MyFitnessPal, to help you track your macronutrients (carbs/protein/fats) and to track your weight and body measurements to see your progress o Set your goal for around 10% carbs/20% protein/70% fat o A good starting goal for amount of net carbs per day is 50 grams (some will aim for 20 grams) o "Net carbs" refers to total grams of carbs minus grams of fiber (as fiber is not typically absorbed). For example, if a food has 5g total carb and 3g fiber, that would be 2g net carbs o Increase healthy fats - eg. olive oil, eggs, nuts, avocado, cheese, butter, coconut, meats, fish o Avoid high carb foods - eg. bread, pasta, potatoes, rice, cookies, soda, juice, anything sugary o Buy full-fat ingredients (avoid low-fat versions, which often have more sugar) o No need to count calories, but pay close attention to grams of carbs on labels Intermittent Fasting (IF) o "Fasting" is going a period of time without eating - it helps to stay busy and well-hydrated o Purpose of fasting is to allow insulin levels to drop as low as possible, allowing your body to switch into fat-burning mode o With this diet there are many approaches to fasting, but 16:8 and 24hr fasts are commonly used o 16:8 fast, usually 5-7 days a week -  Fasting for 16 hours of the day, then eating all meals for the day over course of 8 hours. o 24 hour fast, usually 1-3 days a week - Typically eating one meal a day, then fasting until the next day. Plenty of fluids (and some salt to help you hold onto fluids) are recommended during longer fasts.  o During fasts certain beverages are still acceptable - water, sparkling water, bone broth, black tea or coffee, or tea/coffee with small amount of heavy whipping cream Special note for those on diabetic medications o Discuss your medications with your physician. You may need to hold your medication or adjust to only taking when eating. Diabetics should keep close track of their blood sugars when making any changes to diet/meds, to ensure they are staying within normal limits For more info about LCHF/IF o Watch "Therapeutic Fasting - Solving the Two-Compartment Problem" video by Dr. Sharman Cheek on YouTube (GreatestGyms.com.ee) for a great intro to these concepts o Read "The Obesity Code" and/or "The Complete Guide to Fasting" by Dr. Sharman Cheek o Go to www.dietdoctor.com for explanations, recipes, and infographics about foods to eat/avoid EXAMPLES TO GET STARTED Fasting Beverages -water (can add  tsp [place] salt once or twice a day to help stay hydrated for longer fasts) -Sparkling water (such as [employer] or similar; avoid any with artificial sweeteners)  -Bone broth (multiple recipes available online or can buy pre-made) -Tea or Coffee (Adding  heavy whipping cream or coconut oil to your tea or coffee can be helpful if you find yourself getting too hungry during the fasts. Can also add cinnamon for flavor. Or "bulletproof coffee.") Low Carb Healthy Fat Breakfast (if not fasting) -eggs in butter or olive oil with avocado -omelet with veggies and cheese  Lunch -hamburger with cheese and avocado wrapped in lettuce (no bun, no ketchup) -meat and cheese wrapped in lettuce  (can dip in mustard or olive oil/vinegar/mayo) -salad with meat/cheese/nuts and higher fat dressing (vinaigrette or Ranch, etc) -tuna salad lettuce wrap -taco meat with cheese, sour cream, guacamole, cheese over lettuce  Dinner -steak with herb butter or [place] sauce -"Fathead" pizza (uses cheese and almond flour for the dough - several recipes available online) -roasted or grilled chicken with skin on, with low carb sauce (buffalo, garlic butter, alfredo, pesto, etc) -baked salmon with lemon butter -chicken alfredo with zucchini noodles -[place] butter chicken with low carb garlic naan -egg roll in a bowl  Side Dishes -mashed cauliflower (homemade or available in freezer section) -roast vegetables (green veggies that grow above ground rather than root veggies) with butter or cheese -Caprese salad (fresh mozzarella, tomato and basil with olive oil) -homemade low-carb coleslaw Snacks/Desserts (try to avoid unnecessary snacking and sweets in general) -celery or cucumber dipped in guacamole or sour cream dip -cheese and meat slices  -raspberries with whipped cream (can make homemade with no sugar added) -low carb [place] butter cake  AVOID - sugar, diet/regular soda, potatoes, breads, rice, pasta, candy, cookies, cakes, muffins, juice, high carb fruit (bananas, grapes), beer, ketchup, barbeque and other sweet sauces   - Diabetes Mellitus and Food It is important for you to manage your blood sugar (glucose) level. Your blood glucose level can be greatly affected by what you eat. Eating healthier foods in the appropriate amounts throughout the day at about the same time each day will help you control your blood glucose level. It can also help slow or prevent worsening of your diabetes mellitus. Healthy eating may even help you improve the level of your blood pressure and reach or maintain a healthy weight.  General recommendations for healthful eating and cooking habits  include:  Eating meals and snacks regularly. Avoid going long periods of time without eating to lose weight.  Eating a diet that consists mainly of plant-based foods, such as fruits, vegetables, nuts, legumes, and whole grains.  Using low-heat cooking methods, such as baking, instead of high-heat cooking methods, such as deep frying. Work with your dietitian to make sure you understand how to use the Nutrition Facts information on food labels. HOW CAN FOOD AFFECT ME? Carbohydrates Carbohydrates affect your blood glucose level more than any other type of food. Your dietitian will help you determine how many carbohydrates to eat at each meal and teach you how to count carbohydrates. Counting carbohydrates is important to keep your blood glucose at a healthy level, especially if you are using insulin or taking certain medicines for diabetes mellitus. Alcohol Alcohol can cause sudden decreases in blood glucose (hypoglycemia), especially if you use insulin or take certain medicines for diabetes mellitus. Hypoglycemia can be a life-threatening condition. Symptoms of hypoglycemia (sleepiness, dizziness, and disorientation) are similar to symptoms of having too much alcohol.  If your health care provider has given you approval to drink alcohol, do so in moderation and use the following guidelines:  Women should not have more than one drink per day, and men should not have more than  two drinks per day. One drink is equal to:  12 oz of beer.  5 oz of wine.  1 oz of hard liquor.  Do not drink on an empty stomach.  Keep yourself hydrated. Have water, diet soda, or unsweetened iced tea.  Regular soda, juice, and other mixers might contain a lot of carbohydrates and should be counted. WHAT FOODS ARE NOT RECOMMENDED? As you make food choices, it is important to remember that all foods are not the same. Some foods have fewer nutrients per serving than other foods, even though they might have the same  number of calories or carbohydrates. It is difficult to get your body what it needs when you eat foods with fewer nutrients. Examples of foods that you should avoid that are high in calories and carbohydrates but low in nutrients include:  Trans fats (most processed foods list trans fats on the Nutrition Facts label).  Regular soda.  Juice.  Candy.  Sweets, such as cake, pie, doughnuts, and cookies.  Fried foods. WHAT FOODS CAN I EAT? Eat nutrient-rich foods, which will nourish your body and keep you healthy. The food you should eat also will depend on several factors, including:  The calories you need.  The medicines you take.  Your weight.  Your blood glucose level.  Your blood pressure level.  Your cholesterol level. You should eat a variety of foods, including:  Protein.  Lean cuts of meat.  Proteins low in saturated fats, such as fish, egg whites, and beans. Avoid processed meats.  Fruits and vegetables.  Fruits and vegetables that may help control blood glucose levels, such as apples, mangoes, and yams.  Dairy products.  Choose fat-free or low-fat dairy products, such as milk, yogurt, and cheese.  Grains, bread, pasta, and rice.  Choose whole grain products, such as multigrain bread, whole oats, and brown rice. These foods may help control blood pressure.  Fats.  Foods containing healthful fats, such as nuts, avocado, olive oil, canola oil, and fish. DOES EVERYONE WITH DIABETES MELLITUS HAVE THE SAME MEAL PLAN? Because every person with diabetes mellitus is different, there is not one meal plan that works for everyone. It is very important that you meet with a dietitian who will help you create a meal plan that is just right for you.   This information is not intended to replace advice given to you by your health care provider. Make sure you discuss any questions you have with your health care provider.   Document Released: 03/24/2005 Document Revised:  07/18/2014 Document Reviewed: 05/24/2013 Elsevier Interactive Patient Education 2016 [employer].   -  Diabetes and Exercise Exercising regularly is important. It is not just about losing weight. It has many health benefits, such as:  Improving your overall fitness, flexibility, and endurance.  Increasing your bone density.  Helping with weight control.  Decreasing your body fat.  Increasing your muscle strength.  Reducing stress and tension.  Improving your overall health. People with diabetes who exercise gain additional benefits because exercise:  Reduces appetite.  Improves the body's use of blood sugar (glucose).  Helps lower or control blood glucose.  Decreases blood pressure.  Helps control blood lipids (such as cholesterol and triglycerides).  Improves the body's use of the hormone insulin by:  Increasing the body's insulin sensitivity.  Reducing the body's insulin needs.  Decreases the risk for heart disease because exercising:  Lowers cholesterol and triglycerides levels.  Increases the levels of good cholesterol (such as high-density lipoproteins [  HDL]) in the body.  Lowers blood glucose levels. YOUR ACTIVITY PLAN  Choose an activity that you enjoy, and set realistic goals. To exercise safely, you should begin practicing any new physical activity slowly, and gradually increase the intensity of the exercise over time. Your health care provider or diabetes educator can help create an activity plan that works for you. General recommendations include:  Encouraging children to engage in at least 60 minutes of physical activity each day.  Stretching and performing strength training exercises, such as yoga or weight lifting, at least 2 times per week.  Performing a total of at least 150 minutes of moderate-intensity exercise each week, such as brisk walking or water aerobics.  Exercising at least 3 days per week, making sure you allow no more than 2  consecutive days to pass without exercising.  Avoiding long periods of inactivity (90 minutes or more). When you have to spend an extended period of time sitting down, take frequent breaks to walk or stretch. RECOMMENDATIONS FOR EXERCISING WITH TYPE 1 OR TYPE 2 DIABETES   Check your blood glucose before exercising. If blood glucose levels are greater than 240 mg/dL, check for urine ketones. Do not exercise if ketones are present.  Avoid injecting insulin into areas of the body that are going to be exercised. For example, avoid injecting insulin into:  The arms when playing tennis.  The legs when jogging.  Keep a record of:  Food intake before and after you exercise.  Expected peak times of insulin action.  Blood glucose levels before and after you exercise.  The type and amount of exercise you have done.  Review your records with your health care provider. Your health care provider will help you to develop guidelines for adjusting food intake and insulin amounts before and after exercising.  If you take insulin or oral hypoglycemic agents, watch for signs and symptoms of hypoglycemia. They include:  Dizziness.  Shaking.  Sweating.  Chills.  Confusion.  Drink plenty of water while you exercise to prevent dehydration or heat stroke. Body water is lost during exercise and must be replaced.  Talk to your health care provider before starting an exercise program to make sure it is safe for you. Remember, almost any type of activity is better than none.   This information is not intended to replace advice given to you by your health care provider. Make sure you discuss any questions you have with your health care provider.   Document Released: 09/17/2003 Document Revised: 11/11/2014 Document Reviewed: 12/04/2012 Elsevier Interactive Patient Education 2016 Elsevier Inc.   -  Hypertension Hypertension is another name for high blood pressure. High blood pressure forces your  heart to work harder to pump blood. A blood pressure reading has two numbers, which includes a higher number over a lower number (example: 110/72). HOME CARE   Have your blood pressure rechecked by your doctor.  Only take medicine as told by your doctor. Follow the directions carefully. The medicine does not work as well if you skip doses. Skipping doses also puts you at risk for problems.  Do not smoke.  Monitor your blood pressure at home as told by your doctor. GET HELP IF:  You think you are having a reaction to the medicine you are taking.  You have repeat headaches or feel dizzy.  You have puffiness (swelling) in your ankles.  You have trouble with your vision. GET HELP RIGHT AWAY IF:   You get a very bad headache  and are confused.  You feel weak, numb, or faint.  You get chest or belly (abdominal) pain.  You throw up (vomit).  You cannot breathe very well. MAKE SURE YOU:   Understand these instructions.  Will watch your condition.  Will get help right away if you are not doing well or get worse.   This information is not intended to replace advice given to you by your health care provider. Make sure you discuss any questions you have with your health care provider.   Document Released: 12/14/2007 Document Revised: 07/02/2013 Document Reviewed: 04/19/2013  - DASH Eating Plan DASH stands for "Dietary Approaches to Stop Hypertension." The DASH eating plan is a healthy eating plan that has been shown to reduce high blood pressure (hypertension). Additional health benefits may include reducing the risk of type 2 diabetes mellitus, heart disease, and stroke. The DASH eating plan may also help with weight loss. WHAT DO I NEED TO KNOW ABOUT THE DASH EATING PLAN? For the DASH eating plan, you will follow these general guidelines:  Choose foods with a percent daily value for sodium of less than 5% (as listed on the food label).  Use salt-free seasonings or herbs instead  of table salt or sea salt.  Check with your health care provider or pharmacist before using salt substitutes.  Eat lower-sodium products, often labeled as "lower sodium" or "no salt added."  Eat fresh foods.  Eat more vegetables, fruits, and low-fat dairy products.  Choose whole grains. Look for the word "whole" as the first word in the ingredient list.  Choose fish and skinless chicken or [place] more often than red meat. Limit fish, poultry, and meat to 6 oz (170 g) each day.  Limit sweets, desserts, sugars, and sugary drinks.  Choose heart-healthy fats.  Limit cheese to 1 oz (28 g) per day.  Eat more home-cooked food and less restaurant, buffet, and fast food.  Limit fried foods.  Cook foods using methods other than frying.  Limit canned vegetables. If you do use them, rinse them well to decrease the sodium.  When eating at a restaurant, ask that your food be prepared with less salt, or no salt if possible. WHAT FOODS CAN I EAT? Seek help from a dietitian for individual calorie needs. Grains Whole grain or whole wheat bread. Brown rice. Whole grain or whole wheat pasta. Quinoa, bulgur, and whole grain cereals. Low-sodium cereals. Corn or whole wheat flour tortillas. Whole grain cornbread. Whole grain crackers. Low-sodium crackers. Vegetables Fresh or frozen vegetables (raw, steamed, roasted, or grilled). Low-sodium or reduced-sodium tomato and vegetable juices. Low-sodium or reduced-sodium tomato sauce and paste. Low-sodium or reduced-sodium canned vegetables.  Fruits All fresh, canned (in natural juice), or frozen fruits. Meat and Other Protein Products Ground beef (85% or leaner), grass-fed beef, or beef trimmed of fat. Skinless chicken or [place]. Ground chicken or [place]. Pork trimmed of fat. All fish and seafood. Eggs. Dried beans, peas, or lentils. Unsalted nuts and seeds. Unsalted canned beans. Dairy Low-fat dairy products, such as skim or 1% milk, 2% or reduced-fat  cheeses, low-fat ricotta or cottage cheese, or plain low-fat yogurt. Low-sodium or reduced-sodium cheeses. Fats and Oils Tub margarines without trans fats. Light or reduced-fat mayonnaise and salad dressings (reduced sodium). Avocado. Safflower, olive, or canola oils. Natural peanut or almond butter. Other Unsalted popcorn and pretzels. The items listed above may not be a complete list of recommended foods or beverages. Contact your dietitian for more options. WHAT FOODS ARE NOT  RECOMMENDED? Grains White bread. White pasta. White rice. Refined cornbread. Bagels and croissants. Crackers that contain trans fat. Vegetables Creamed or fried vegetables. Vegetables in a cheese sauce. Regular canned vegetables. Regular canned tomato sauce and paste. Regular tomato and vegetable juices. Fruits Dried fruits. Canned fruit in light or heavy syrup. Fruit juice. Meat and Other Protein Products Fatty cuts of meat. Ribs, chicken wings, bacon, sausage, bologna, salami, chitterlings, fatback, hot dogs, bratwurst, and packaged luncheon meats. Salted nuts and seeds. Canned beans with salt. Dairy Whole or 2% milk, cream, half-and-half, and cream cheese. Whole-fat or sweetened yogurt. Full-fat cheeses or blue cheese. Nondairy creamers and whipped toppings. Processed cheese, cheese spreads, or cheese curds. Condiments Onion and garlic salt, seasoned salt, table salt, and sea salt. Canned and packaged gravies. Worcestershire sauce. Tartar sauce. Barbecue sauce. Teriyaki sauce. Soy sauce, including reduced sodium. Steak sauce. Fish sauce. Oyster sauce. Cocktail sauce. Horseradish. Ketchup and mustard. Meat flavorings and tenderizers. Bouillon cubes. Hot sauce. Tabasco sauce. Marinades. Taco seasonings. Relishes. Fats and Oils Butter, stick margarine, lard, shortening, ghee, and bacon fat. Coconut, palm kernel, or palm oils. Regular salad dressings. Other Pickles and olives. Salted popcorn and pretzels. The items  listed above may not be a complete list of foods and beverages to avoid. Contact your dietitian for more information. WHERE CAN I FIND MORE INFORMATION? National Heart, Lung, and Blood Institute: travelstabloid.com   This information is not intended to replace advice given to you by your health care provider. Make sure you discuss any questions you have with your health care provider.   Document Released: 06/16/2011 Document Revised: 07/18/2014 Document Reviewed: 05/01/2013 Elsevier Interactive Patient Education 2016 [employer].  Chartered certified accountant Patient Education 2016 [employer]. Pneumococcal Polysaccharide Vaccine: What You Need to Know 1. Why get vaccinated? Vaccination can protect older adults (and some children and younger adults) from pneumococcal disease. Pneumococcal disease is caused by bacteria that can spread from person to person through close contact. It can cause ear infections, and it can also lead to more serious infections of the:   Lungs (pneumonia),  Blood (bacteremia), and  Covering of the brain and spinal cord (meningitis). Meningitis can cause deafness and brain damage, and it can be fatal. Anyone can get pneumococcal disease, but children under 50 years of age, people with certain medical conditions, adults over 83 years of age, and cigarette smokers are at the highest risk. About 18,000 older adults die each year from pneumococcal disease in the [place]. Treatment of pneumococcal infections with penicillin and other drugs used to be more effective. But some strains of the disease have become resistant to these drugs. This makes prevention of the disease, through vaccination, even more important. 2. Pneumococcal polysaccharide vaccine (PPSV23) Pneumococcal polysaccharide vaccine (PPSV23) protects against 23 types of pneumococcal bacteria. It will not prevent all pneumococcal disease. PPSV23 is recommended for:  All  adults 49 years of age and older,  Anyone 2 through 61 years of age with certain long-term health problems,  Anyone 2 through 61 years of age with a weakened immune system,  Adults 34 through 61 years of age who smoke cigarettes or have asthma. Most people need only one dose of PPSV. A second dose is recommended for certain high-risk groups. People 34 and older should get a dose even if they have gotten one or more doses of the vaccine before they turned 65. Your healthcare provider can give you more information about these recommendations. Most healthy adults develop protection within 2  to 3 weeks of getting the shot. 3. Some people should not get this vaccine  Anyone who has had a life-threatening allergic reaction to PPSV should not get another dose.  Anyone who has a severe allergy to any component of PPSV should not receive it. Tell your provider if you have any severe allergies.  Anyone who is moderately or severely ill when the shot is scheduled may be asked to wait until they recover before getting the vaccine. Someone with a mild illness can usually be vaccinated.  Children less than 63 years of age should not receive this vaccine.  There is no evidence that PPSV is harmful to either a pregnant woman or to her fetus. However, as a precaution, women who need the vaccine should be vaccinated before becoming pregnant, if possible. 4. Risks of a vaccine reaction With any medicine, including vaccines, there is a chance of side effects. These are usually mild and go away on their own, but serious reactions are also possible. About half of people who get PPSV have mild side effects, such as redness or pain where the shot is given, which go away within about two days. Less than 1 out of 100 people develop a fever, muscle aches, or more severe local reactions. Problems that could happen after any vaccine:  People sometimes faint after a medical procedure, including vaccination. Sitting or  lying down for about 15 minutes can help prevent fainting, and injuries caused by a fall. Tell your doctor if you feel dizzy, or have vision changes or ringing in the ears.  Some people get severe pain in the shoulder and have difficulty moving the arm where a shot was given. This happens very rarely.  Any medication can cause a severe allergic reaction. Such reactions from a vaccine are very rare, estimated at about 1 in a million doses, and would happen within a few minutes to a few hours after the vaccination. As with any medicine, there is a very remote chance of a vaccine causing a serious injury or death. The safety of vaccines is always being monitored. For more information, visit: http://www.aguilar.org/ 5. What if there is a serious reaction? What should I look for? Look for anything that concerns you, such as signs of a severe allergic reaction, very high fever, or unusual behavior.  Signs of a severe allergic reaction can include hives, swelling of the face and throat, difficulty breathing, a fast heartbeat, dizziness, and weakness. These would usually start a few minutes to a few hours after the vaccination. What should I do? If you think it is a severe allergic reaction or other emergency that can't wait, call 9-1-1 or get to the nearest hospital. Otherwise, call your doctor. Afterward, the reaction should be reported to the Vaccine Adverse Event Reporting System (VAERS). Your doctor might file this report, or you can do it yourself through the VAERS web site at www.vaers.SamedayNews.es, or by calling 380-006-1235.  VAERS does not give medical advice. 6. How can I learn more?  Ask your doctor. He or she can give you the vaccine package insert or suggest other sources of information.  Call your local or state health department.  Contact the Centers for Disease Control and Prevention (CDC):  Call 712-845-8422 (1-800-CDC-INFO) or  Visit CDC's website at http://hunter.com/ CDC  Pneumococcal Polysaccharide Vaccine VIS (11/01/13)   This information is not intended to replace advice given to you by your health care provider. Make sure you discuss any questions you have with your  health care provider.   Document Released: 04/24/2006 Document Revised: 07/18/2014 Document Reviewed: 11/04/2013 Elsevier Interactive Patient Education 2016 Elsevier Inc. Influenza Virus Vaccine injection (Fluarix) What is this medicine? INFLUENZA VIRUS VACCINE (in floo EN zuh VAHY ruhs vak SEEN) helps to reduce the risk of getting influenza also known as the flu. This medicine may be used for other purposes; ask your health care provider or pharmacist if you have questions. What should I tell my health care provider before I take this medicine? They need to know if you have any of these conditions: -bleeding disorder like hemophilia -fever or infection -Guillain-Barre syndrome or other neurological problems -immune system problems -infection with the human immunodeficiency virus (HIV) or AIDS -low blood platelet counts -multiple sclerosis -an unusual or allergic reaction to influenza virus vaccine, eggs, chicken proteins, latex, gentamicin, other medicines, foods, dyes or preservatives -pregnant or trying to get pregnant -breast-feeding How should I use this medicine? This vaccine is for injection into a muscle. It is given by a health care professional. A copy of Vaccine Information Statements will be given before each vaccination. Read this sheet carefully each time. The sheet may change frequently. Talk to your pediatrician regarding the use of this medicine in children. Special care may be needed. Overdosage: If you think you have taken too much of this medicine contact a poison control center or emergency room at once. NOTE: This medicine is only for you. Do not share this medicine with others. What if I miss a dose? This does not apply. What may interact with this  medicine? -chemotherapy or radiation therapy -medicines that lower your immune system like etanercept, anakinra, infliximab, and adalimumab -medicines that treat or prevent blood clots like warfarin -phenytoin -steroid medicines like prednisone or cortisone -theophylline -vaccines This list may not describe all possible interactions. Give your health care provider a list of all the medicines, herbs, non-prescription drugs, or dietary supplements you use. Also tell them if you smoke, drink alcohol, or use illegal drugs. Some items may interact with your medicine. What should I watch for while using this medicine? Report any side effects that do not go away within 3 days to your doctor or health care professional. Call your health care provider if any unusual symptoms occur within 6 weeks of receiving this vaccine. You may still catch the flu, but the illness is not usually as bad. You cannot get the flu from the vaccine. The vaccine will not protect against colds or other illnesses that may cause fever. The vaccine is needed every year. What side effects may I notice from receiving this medicine? Side effects that you should report to your doctor or health care professional as soon as possible: -allergic reactions like skin rash, itching or hives, swelling of the face, lips, or tongue Side effects that usually do not require medical attention (report to your doctor or health care professional if they continue or are bothersome): -fever -headache -muscle aches and pains -pain, tenderness, redness, or swelling at site where injected -weak or tired This list may not describe all possible side effects. Call your doctor for medical advice about side effects. You may report side effects to FDA at 1-800-FDA-1088. Where should I keep my medicine? This vaccine is only given in a clinic, pharmacy, doctor's office, or other health care setting and will not be stored at home. NOTE: This sheet is a  summary. It may not cover all possible information. If you have questions about this medicine, talk to your  doctor, pharmacist, or health care provider.    2016, Elsevier/Gold Standard. (2008-01-23 09:30:40)

## 2016-03-24 NOTE — Progress Notes (Signed)
Pt is in the office today for hypertension and diabetes Pt states she is not in any pain Pt states she is taking medications without difficulties

## 2016-03-24 NOTE — Progress Notes (Signed)
Vanessa [name], is a 60 y.o. female  LNL:[id number]  EYC:[id number]  DOB - 02/02/1956  Chief Complaint  Patient presents with  . Diabetes  . Hypertension        Subjective:   Vanessa [name] is a 60 y.o. female here today for a follow up visit for dm. Pt is doing well on nph 70 /30 regimen  Currently, says most of time her sugars are under 200. Denies hypoglycemic events. She is doing better and have cut back on sodas greatly.  Remains uninsured, and does not as of yet of [employer] or [employer] card, so wants to hold off of MM /colonscopy/other screenings.  Some sob w/ exertion, so she wants to really work on losing weight. Denies orthopnea/pnd/syncope/palpitations.  Patient has No headache, No chest pain, No abdominal pain - No Nausea, No new weakness tingling or numbness, No Cough - SOB currently.  No problems updated.  ALLERGIES: No Known Allergies  PAST MEDICAL HISTORY: Past Medical History:  Diagnosis Date  . Asthma   . Diabetes mellitus without complication ([place])   . Heart murmur   . Hypertension     MEDICATIONS AT HOME: Prior to Admission medications   Medication Sig Start Date End Date Taking? Authorizing Provider  amLODipine (NORVASC) 10 MG tablet Take 1 tablet (10 mg total) by mouth daily. 12/21/15  Yes Maren Reamer, MD  aspirin 81 MG tablet Take 1 tablet (81 mg total) by mouth daily. 05/23/14  Yes Lance Bosch, NP  atorvastatin (LIPITOR) 40 MG tablet Take 1 tablet (40 mg total) by mouth daily. 01/28/16  Yes Tresa Garter, MD  Blood Glucose Monitoring Suppl (TRUE METRIX METER) w/Device KIT Check sugar before meals for E11.9 08/03/15  Yes Lance Bosch, NP  fluticasone (FLONASE) 50 MCG/ACT nasal spray Place 2 sprays into both nostrils daily. 07/24/15  Yes Lance Bosch, NP  glucose blood (TRUE METRIX BLOOD GLUCOSE TEST) test strip Check sugar before meals for E11.9 01/07/16  Yes Olugbemiga E Doreene Burke, MD  hydrochlorothiazide (HYDRODIURIL) 25 MG tablet Take 1  tablet (25 mg total) by mouth daily. 12/21/15  Yes Maren Reamer, MD  insulin NPH-regular Human (NOVOLIN 70/30) (70-30) 100 UNIT/ML injection Take 44 units in the morning and 38 units at night. 02/11/16  Yes Tresa Garter, MD  INSULIN SYRINGE 1CC/29G 29G X 1/2" 1 ML MISC Insulin syringes 03/06/14  Yes Courtney Forcucci, PA-C  lisinopril (PRINIVIL,ZESTRIL) 40 MG tablet Take 1 tablet (40 mg total) by mouth daily. 01/28/16  Yes Tresa Garter, MD  loratadine (CLARITIN) 10 MG tablet Take 1 tablet (10 mg total) by mouth daily. 07/24/15  Yes Lance Bosch, NP  TRUEPLUS LANCETS 28G MISC Check sugar before meals for E11.9 08/03/15  Yes Lance Bosch, NP     Objective:   Vitals:   03/24/16 1211  BP: 133/84  Pulse: 75  Resp: 16  Temp: 98.1 F (36.7 C)  TempSrc: Oral  SpO2: 96%  Weight: 237 lb (107.5 kg)   bmi 46  Exam General appearance : Awake, alert, not in any distress. Speech Clear. Not toxic looking, morbid obese HEENT: Atraumatic and Normocephalic, pupils equally reactive to light. Neck: supple, no JVD.  Chest:Good air entry bilaterally, no added sounds. CVS: S1 S2 regular, no murmurs/gallups or rubs. Abdomen: Bowel sounds active, obese, Non tender and not distended with no gaurding, rigidity or rebound. Extremities: B/L Lower Ext shows no edema, both legs are warm to touch Neurology: Awake alert,  and oriented X 3, CN II-XII grossly intact, Non focal Skin:No Rash  Data Review Lab Results  Component Value Date   HGBA1C 8.3 03/24/2016   HGBA1C 10.3 12/21/2015   HGBA1C 9.60 08/03/2015    Depression screen PHQ 2/9 03/24/2016 12/21/2015 03/23/2015 04/06/2014 03/10/2014  Decreased Interest 3 2 1  0 0  Down, Depressed, Hopeless 3 2 1 1  0  PHQ - 2 Score 6 4 2 1  0  Altered sleeping 3 3 3  - -  Tired, decreased energy 3 3 3  - -  Change in appetite 3 3 3  - -  Feeling bad or failure about yourself  2 3 2  - -  Trouble concentrating 2 2 2  - -  Moving slowly or fidgety/restless 2 3  1  - -  Suicidal thoughts 0 3 0 - -  PHQ-9 Score 21 24 16  - -      Assessment & Plan   1. Essential hypertension Controlled, continue asa 81, norvasc 10qd, lisinopril 40 qd, hctz 25 qd  2. Type 2 diabetes mellitus with complication, with long-term current use of insulin ([place]) Doing better, continue to encourage low carb diet/exercise. - Microalbumin/Creatinine Ratio, Urine - CBC with Differential - TSH - Glucose (CBG) - HgB A1c 8.3 61fom 10!) - same nph regimen, renewed  3. Dyslipidemia Currently not fasting, renewed lipitor 40  4. Morbid obesity, unspecified obesity type ([place] bmi >40, recd weight loss to help w/ her medical illnesses - extensive discussion about low carb diet, recd <50gm daily, increase exercise/activity for weight loss - provided handout, recd read The Obesity Code from Dr JSharman Cheek [email or web address] - TSH - advised her that if she really wants to lose weight to try this diet since there are no calorie requirements/hunger pains.  It would certainly reduced her insulin needs if she reduces her carb intake, so urged her to check it and call [place] monitor, may need to reduce insulin if starts losing a lot of weight.  5. Flu vaccine need - Flu Vaccine QUAD 36+ mos PF IM (Fluarix & Fluzone Quad PF)   6. Pneumococcal 23v today.  Patient have been counseled extensively about nutrition and exercise  Return in about 3 months (around 06/23/2016).  The patient was given clear instructions to go to ER or return to medical center if symptoms don't improve, worsen or new problems develop. The patient verbalized understanding. The patient was told to call to get lab results if they haven't heard anything in the next week.   This note has been created with DSurveyor, quantity Any transcriptional errors are unintentional.   DMaren Reamer MD, MHagueand WK[facility] IncGHartford  NPrentice  03/24/2016, 1:05 PM

## 2016-03-25 LAB — MICROALBUMIN / CREATININE URINE RATIO
CREATININE, URINE: 232 mg/dL (ref 20–320)
MICROALB UR: 12.5 mg/dL
MICROALB/CREAT RATIO: 54 ug/mg{creat} — AB (ref <30)

## 2016-04-01 ENCOUNTER — Telehealth: Payer: Self-pay

## 2016-04-01 NOTE — Telephone Encounter (Signed)
Contacted pt to go over lab results pt is aware of results and doesn't have any questions or concerns 

## 2016-04-21 MED FILL — NOVOLIN 70/30 100 UNITS/ML: (70-30) 100 | 24 days supply | Qty: 20 | Fill #0

## 2016-04-21 MED FILL — HYDROCHLOROTHIAZIDE 25 MG T: 25 | 30 days supply | Qty: 30 | Fill #0

## 2016-04-21 MED FILL — LISINOPRIL 40 MG TABLET: 40 | 30 days supply | Qty: 30 | Fill #0

## 2016-04-21 MED FILL — AMLODIPINE BESYLATE 10 MG T: 10 | 30 days supply | Qty: 30 | Fill #0

## 2016-05-11 MED FILL — NOVOLIN 70/30 100 UNITS/ML: (70-30) 100 | 24 days supply | Qty: 20 | Fill #1

## 2016-06-27 ENCOUNTER — Ambulatory Visit: Payer: Medicaid - Out of State | Admitting: Internal Medicine

## 2016-07-11 MED FILL — TRUE METRIX TEST STRIP: 15 days supply | Qty: 50 | Fill #2

## 2016-07-11 MED FILL — AMLODIPINE BESYLATE 10 MG T: 10 | 30 days supply | Qty: 30 | Fill #1

## 2016-07-11 MED FILL — ATORVASTATIN 40 MG TABLET: 40 | 30 days supply | Qty: 30 | Fill #0

## 2016-07-11 MED FILL — LISINOPRIL 40 MG TABLET: 40 | 30 days supply | Qty: 30 | Fill #1

## 2016-07-14 MED FILL — NOVOLIN 70/30 100 UNITS/ML: (70-30) 100 | 24 days supply | Qty: 20 | Fill #2

## 2016-07-14 MED FILL — HYDROCHLOROTHIAZIDE 25 MG T: 25 | 30 days supply | Qty: 30 | Fill #1

## 2016-07-18 ENCOUNTER — Ambulatory Visit: Payer: Medicaid - Out of State | Admitting: Internal Medicine

## 2016-08-11 MED FILL — ATORVASTATIN 40 MG TABLET: 40 | 30 days supply | Qty: 30 | Fill #1 | Status: TO

## 2016-08-11 MED FILL — HYDROCHLOROTHIAZIDE 25 MG T: 25 | 30 days supply | Qty: 30 | Fill #2 | Status: TO

## 2016-08-11 MED FILL — TRUE METRIX TEST STRIP: 15 days supply | Qty: 50 | Fill #3 | Status: TO

## 2016-08-11 MED FILL — NOVOLIN 70/30 100 UNITS/ML: (70-30) 100 | 24 days supply | Qty: 20 | Fill #3 | Status: TO

## 2016-08-11 MED FILL — AMLODIPINE BESYLATE 10 MG T: 10 | 30 days supply | Qty: 30 | Fill #2

## 2016-08-15 ENCOUNTER — Ambulatory Visit: Payer: BLUE CROSS/BLUE SHIELD | Attending: Internal Medicine | Admitting: Internal Medicine

## 2016-08-15 VITALS — BP 159/81 | HR 91 | Temp 98.7°F | Resp 16 | Wt 242.4 lb

## 2016-08-15 DIAGNOSIS — E118 Type 2 diabetes mellitus with unspecified complications: Secondary | ICD-10-CM | POA: Diagnosis not present

## 2016-08-15 DIAGNOSIS — Z79899 Other long term (current) drug therapy: Secondary | ICD-10-CM | POA: Diagnosis not present

## 2016-08-15 DIAGNOSIS — I10 Essential (primary) hypertension: Secondary | ICD-10-CM | POA: Diagnosis not present

## 2016-08-15 DIAGNOSIS — Z7982 Long term (current) use of aspirin: Secondary | ICD-10-CM | POA: Insufficient documentation

## 2016-08-15 DIAGNOSIS — Z1231 Encounter for screening mammogram for malignant neoplasm of breast: Secondary | ICD-10-CM

## 2016-08-15 DIAGNOSIS — E785 Hyperlipidemia, unspecified: Secondary | ICD-10-CM | POA: Diagnosis not present

## 2016-08-15 DIAGNOSIS — E119 Type 2 diabetes mellitus without complications: Secondary | ICD-10-CM | POA: Diagnosis not present

## 2016-08-15 DIAGNOSIS — Z1211 Encounter for screening for malignant neoplasm of colon: Secondary | ICD-10-CM | POA: Diagnosis not present

## 2016-08-15 DIAGNOSIS — Z23 Encounter for immunization: Secondary | ICD-10-CM | POA: Diagnosis not present

## 2016-08-15 DIAGNOSIS — J45909 Unspecified asthma, uncomplicated: Secondary | ICD-10-CM | POA: Insufficient documentation

## 2016-08-15 DIAGNOSIS — Z794 Long term (current) use of insulin: Secondary | ICD-10-CM | POA: Diagnosis not present

## 2016-08-15 DIAGNOSIS — Z1239 Encounter for other screening for malignant neoplasm of breast: Secondary | ICD-10-CM

## 2016-08-15 LAB — BASIC METABOLIC PANEL WITH GFR
BUN: 20 mg/dL (ref 7–25)
CHLORIDE: 104 mmol/L (ref 98–110)
CO2: 31 mmol/L (ref 20–31)
Calcium: 8.9 mg/dL (ref 8.6–10.4)
Creat: 1.21 mg/dL — ABNORMAL HIGH (ref 0.50–0.99)
GFR, EST AFRICAN AMERICAN: 56 mL/min — AB (ref >=60)
GFR, EST NON AFRICAN AMERICAN: 48 mL/min — AB (ref >=60)
GLUCOSE: 167 mg/dL — AB (ref 65–99)
POTASSIUM: 3.3 mmol/L — AB (ref 3.5–5.3)
Sodium: 142 mmol/L (ref 135–146)

## 2016-08-15 LAB — POCT GLYCOSYLATED HEMOGLOBIN (HGB A1C): Hemoglobin A1C: 7.6

## 2016-08-15 LAB — GLUCOSE, POCT (MANUAL RESULT ENTRY): POC Glucose: 187 mg/dl — AB (ref 70–99)

## 2016-08-15 MED ORDER — AMLODIPINE BESYLATE 10 MG PO TABS: 10.0000 mg | ORAL_TABLET | Freq: Every day | ORAL | 3 refills | Status: DC

## 2016-08-15 MED ORDER — INSULIN NPH ISOPHANE & REGULAR (70-30) 100 UNIT/ML ~~LOC~~ SUSP: SUBCUTANEOUS | 3 refills | Status: DC

## 2016-08-15 MED ORDER — ATORVASTATIN CALCIUM 40 MG PO TABS: 40.0000 mg | ORAL_TABLET | Freq: Every day | ORAL | 3 refills | Status: DC

## 2016-08-15 MED ORDER — HYDROCHLOROTHIAZIDE 25 MG PO TABS: 25.0000 mg | ORAL_TABLET | Freq: Every day | ORAL | 3 refills | Status: DC

## 2016-08-15 MED ORDER — LISINOPRIL 40 MG PO TABS: 40.0000 mg | ORAL_TABLET | Freq: Every day | ORAL | 3 refills | Status: DC

## 2016-08-15 MED FILL — LISINOPRIL 40 MG TABLET: 40 | 90 days supply | Qty: 90 | Fill #0 | Status: TO

## 2016-08-15 NOTE — Patient Instructions (Addendum)
Vanessa [name] - pharm bp check and dm check.  -   Low-Sodium Eating Plan Sodium raises blood pressure and causes water to be held in the body. Getting less sodium from food will help lower your blood pressure, reduce any swelling, and protect your heart, liver, and kidneys. We get sodium by adding salt (sodium chloride) to food. Most of our sodium comes from canned, boxed, and frozen foods. Restaurant foods, fast foods, and pizza are also very high in sodium. Even if you take medicine to lower your blood pressure or to reduce fluid in your body, getting less sodium from your food is important. What is my plan? Most people should limit their sodium intake to 2,300 mg a day. Your health care provider recommends that you limit your sodium intake to 2000mg  a day. What do I need to know about this eating plan? For the low-sodium eating plan, you will follow these general guidelines:  Choose foods with a % Daily Value for sodium of less than 5% (as listed on the food label).  Use salt-free seasonings or herbs instead of table salt or sea salt.  Check with your health care provider or pharmacist before using salt substitutes.  Eat fresh foods.  Eat more vegetables and fruits.  Limit canned vegetables. If you do use them, rinse them well to decrease the sodium.  Limit cheese to 1 oz (28 g) per day.  Eat lower-sodium products, often labeled as "lower sodium" or "no salt added."  Avoid foods that contain monosodium glutamate (MSG). MSG is sometimes added to [place] food and some canned foods.  Check food labels (Nutrition Facts labels) on foods to learn how much sodium is in one serving.  Eat more home-cooked food and less restaurant, buffet, and fast food.  When eating at a restaurant, ask that your food be prepared with less salt, or no salt if possible. How do I read food labels for sodium information? The Nutrition Facts label lists the amount of sodium in one serving of the food. If you eat  more than one serving, you must multiply the listed amount of sodium by the number of servings. Food labels may also identify foods as:  Sodium free-Less than 5 mg in a serving.  Very low sodium-35 mg or less in a serving.  Low sodium-140 mg or less in a serving.  Light in sodium-50% less sodium in a serving. For example, if a food that usually has 300 mg of sodium is changed to become light in sodium, it will have 150 mg of sodium.  Reduced sodium-25% less sodium in a serving. For example, if a food that usually has 400 mg of sodium is changed to reduced sodium, it will have 300 mg of sodium. What foods can I eat? Grains  Low-sodium cereals, including oats, puffed wheat and rice, and shredded wheat cereals. Low-sodium crackers. Unsalted rice and pasta. Lower-sodium bread. Vegetables  Frozen or fresh vegetables. Low-sodium or reduced-sodium canned vegetables. Low-sodium or reduced-sodium tomato sauce and paste. Low-sodium or reduced-sodium tomato and vegetable juices. Fruits  Fresh, frozen, and canned fruit. Fruit juice. Meat and Other Protein Products  Low-sodium canned tuna and salmon. Fresh or frozen meat, poultry, seafood, and fish. Lamb. Unsalted nuts. Dried beans, peas, and lentils without added salt. Unsalted canned beans. Homemade soups without salt. Eggs. Dairy  Milk. Soy milk. Ricotta cheese. Low-sodium or reduced-sodium cheeses. Yogurt. Condiments  Fresh and dried herbs and spices. Salt-free seasonings. Onion and garlic powders. Low-sodium varieties of mustard and  ketchup. Fresh or refrigerated horseradish. Lemon juice. Fats and Oils  Reduced-sodium salad dressings. Unsalted butter. Other  Unsalted popcorn and pretzels. The items listed above may not be a complete list of recommended foods or beverages. Contact your dietitian for more options.  What foods are not recommended? Grains  Instant hot cereals. Bread stuffing, pancake, and biscuit mixes. Croutons. Seasoned rice or  pasta mixes. Noodle soup cups. Boxed or frozen macaroni and cheese. Self-rising flour. Regular salted crackers. Vegetables  Regular canned vegetables. Regular canned tomato sauce and paste. Regular tomato and vegetable juices. Frozen vegetables in sauces. Salted [place] fries. Olives. Angie Fava. Relishes. Sauerkraut. Salsa. Meat and Other Protein Products  Salted, canned, smoked, spiced, or pickled meats, seafood, or fish. Bacon, ham, sausage, hot dogs, corned beef, chipped beef, and packaged luncheon meats. Salt pork. Jerky. Pickled herring. Anchovies, regular canned tuna, and sardines. Salted nuts. Dairy  Processed cheese and cheese spreads. Cheese curds. Blue cheese and cottage cheese. Buttermilk. Condiments  Onion and garlic salt, seasoned salt, table salt, and sea salt. Canned and packaged gravies. Worcestershire sauce. Tartar sauce. Barbecue sauce. Teriyaki sauce. Soy sauce, including reduced sodium. Steak sauce. Fish sauce. Oyster sauce. Cocktail sauce. Horseradish that you find on the shelf. Regular ketchup and mustard. Meat flavorings and tenderizers. Bouillon cubes. Hot sauce. Tabasco sauce. Marinades. Taco seasonings. Relishes. Fats and Oils  Regular salad dressings. Salted butter. Margarine. Ghee. Bacon fat. Other  Potato and tortilla chips. Corn chips and puffs. Salted popcorn and pretzels. Canned or dried soups. Pizza. Frozen entrees and pot pies. The items listed above may not be a complete list of foods and beverages to avoid. Contact your dietitian for more information.  This information is not intended to replace advice given to you by your health care provider. Make sure you discuss any questions you have with your health care provider. Document Released: 12/17/2001 Document Revised: 12/03/2015 Document Reviewed: 05/01/2013 Elsevier Interactive Patient Education  2017 Else[employer] Inc.   - Diabetes Mellitus and Food It is important for you to manage your blood sugar (glucose) level.  Your blood glucose level can be greatly affected by what you eat. Eating healthier foods in the appropriate amounts throughout the day at about the same time each day will help you control your blood glucose level. It can also help slow or prevent worsening of your diabetes mellitus. Healthy eating may even help you improve the level of your blood pressure and reach or maintain a healthy weight. General recommendations for healthful eating and cooking habits include:  Eating meals and snacks regularly. Avoid going long periods of time without eating to lose weight.  Eating a diet that consists mainly of plant-based foods, such as fruits, vegetables, nuts, legumes, and whole grains.  Using low-heat cooking methods, such as baking, instead of high-heat cooking methods, such as deep frying. Work with your dietitian to make sure you understand how to use the Nutrition Facts information on food labels. How can food affect me? Carbohydrates  Carbohydrates affect your blood glucose level more than any other type of food. Your dietitian will help you determine how many carbohydrates to eat at each meal and teach you how to count carbohydrates. Counting carbohydrates is important to keep your blood glucose at a healthy level, especially if you are using insulin or taking certain medicines for diabetes mellitus. Alcohol  Alcohol can cause sudden decreases in blood glucose (hypoglycemia), especially if you use insulin or take certain medicines for diabetes mellitus. Hypoglycemia can be a life-threatening  condition. Symptoms of hypoglycemia (sleepiness, dizziness, and disorientation) are similar to symptoms of having too much alcohol. If your health care provider has given you approval to drink alcohol, do so in moderation and use the following guidelines:  Women should not have more than one drink per day, and men should not have more than two drinks per day. One drink is equal to:  12 oz of beer.  5 oz of  wine.  1 oz of hard liquor.  Do not drink on an empty stomach.  Keep yourself hydrated. Have water, diet soda, or unsweetened iced tea.  Regular soda, juice, and other mixers might contain a lot of carbohydrates and should be counted. What foods are not recommended? As you make food choices, it is important to remember that all foods are not the same. Some foods have fewer nutrients per serving than other foods, even though they might have the same number of calories or carbohydrates. It is difficult to get your body what it needs when you eat foods with fewer nutrients. Examples of foods that you should avoid that are high in calories and carbohydrates but low in nutrients include:  Trans fats (most processed foods list trans fats on the Nutrition Facts label).  Regular soda.  Juice.  Candy.  Sweets, such as cake, pie, doughnuts, and cookies.  Fried foods. What foods can I eat? Eat nutrient-rich foods, which will nourish your body and keep you healthy. The food you should eat also will depend on several factors, including:  The calories you need.  The medicines you take.  Your weight.  Your blood glucose level.  Your blood pressure level.  Your cholesterol level. You should eat a variety of foods, including:  Protein.  Lean cuts of meat.  Proteins low in saturated fats, such as fish, egg whites, and beans. Avoid processed meats.  Fruits and vegetables.  Fruits and vegetables that may help control blood glucose levels, such as apples, mangoes, and yams.  Dairy products.  Choose fat-free or low-fat dairy products, such as milk, yogurt, and cheese.  Grains, bread, pasta, and rice.  Choose whole grain products, such as multigrain bread, whole oats, and brown rice. These foods may help control blood pressure.  Fats.  Foods containing healthful fats, such as nuts, avocado, olive oil, canola oil, and fish. Does everyone with diabetes mellitus have the same meal  plan? Because every person with diabetes mellitus is different, there is not one meal plan that works for everyone. It is very important that you meet with a dietitian who will help you create a meal plan that is just right for you. This information is not intended to replace advice given to you by your health care provider. Make sure you discuss any questions you have with your health care provider. Document Released: 03/24/2005 Document Revised: 12/03/2015 Document Reviewed: 05/24/2013 Elsevier Interactive Patient Education  2017 Elsevier Inc.   -  Diabetes Mellitus and Exercise Exercising regularly is important for your overall health, especially when you have diabetes (diabetes mellitus). Exercising is not only about losing weight. It has many health benefits, such as increasing muscle strength and bone density and reducing body fat and stress. This leads to improved fitness, flexibility, and endurance, all of which result in better overall health. Exercise has additional benefits for people with diabetes, including:  Reducing appetite.  Helping to lower and control blood glucose.  Lowering blood pressure.  Helping to control amounts of fatty substances (lipids) in the blood,  such as cholesterol and triglycerides.  Helping the body to respond better to insulin (improving insulin sensitivity).  Reducing how much insulin the body needs.  Decreasing the risk for heart disease by:  Lowering cholesterol and triglyceride levels.  Increasing the levels of good cholesterol.  Lowering blood glucose levels. What is my activity plan? Your health care provider or certified diabetes educator can help you make a plan for the type and frequency of exercise (activity plan) that works for you. Make sure that you:  Do at least 150 minutes of moderate-intensity or vigorous-intensity exercise each week. This could be brisk walking, biking, or water aerobics.  Do stretching and strength  exercises, such as yoga or weightlifting, at least 2 times a week.  Spread out your activity over at least 3 days of the week.  Get some form of physical activity every day.  Do not go more than 2 days in a row without some kind of physical activity.  Avoid being inactive for more than 90 minutes at a time. Take frequent breaks to walk or stretch.  Choose a type of exercise or activity that you enjoy, and set realistic goals.  Start slowly, and gradually increase the intensity of your exercise over time. What do I need to know about managing my diabetes?  Check your blood glucose before and after exercising.  If your blood glucose is higher than 240 mg/dL (13.3 mmol/L) before you exercise, check your urine for ketones. If you have ketones in your urine, do not exercise until your blood glucose returns to normal.  Know the symptoms of low blood glucose (hypoglycemia) and how to treat it. Your risk for hypoglycemia increases during and after exercise. Common symptoms of hypoglycemia can include:  Hunger.  Anxiety.  Sweating and feeling clammy.  Confusion.  Dizziness or feeling light-headed.  Increased heart rate or palpitations.  Blurry vision.  Tingling or numbness around the mouth, lips, or tongue.  Tremors or shakes.  Irritability.  Keep a rapid-acting carbohydrate snack available before, during, and after exercise to help prevent or treat hypoglycemia.  Avoid injecting insulin into areas of the body that are going to be exercised. For example, avoid injecting insulin into:  The arms, when playing tennis.  The legs, when jogging.  Keep records of your exercise habits. Doing this can help you and your health care provider adjust your diabetes management plan as needed. Write down:  Food that you eat before and after you exercise.  Blood glucose levels before and after you exercise.  The type and amount of exercise you have done.  When your insulin is expected  to peak, if you use insulin. Avoid exercising at times when your insulin is peaking.  When you start a new exercise or activity, work with your health care provider to make sure the activity is safe for you, and to adjust your insulin, medicines, or food intake as needed.  Drink plenty of water while you exercise to prevent dehydration or heat stroke. Drink enough fluid to keep your urine clear or pale yellow. This information is not intended to replace advice given to you by your health care provider. Make sure you discuss any questions you have with your health care provider. Document Released: 09/17/2003 Document Revised: 01/15/2016 Document Reviewed: 12/07/2015 Elsevier Interactive Patient Education  2017 [place] (Tetanus and Diphtheria): What You Need to Know 1. Why get vaccinated? Tetanus  and diphtheria are very serious diseases. They are rare in the [place] today, but people who do become infected often have severe complications. Td vaccine is used to protect adolescents and adults from both of these diseases. Both tetanus and diphtheria are infections caused by bacteria. Diphtheria spreads from person to person through coughing or sneezing. Tetanus-causing bacteria enter the body through cuts, scratches, or wounds. TETANUS (lockjaw) causes painful muscle tightening and stiffness, usually all over the body.  It can lead to tightening of muscles in the head and neck so you can't open your mouth, swallow, or sometimes even breathe. Tetanus kills about 1 out of every 10 people who are infected even after receiving the best medical care. DIPHTHERIA can cause a thick coating to form in the back of the throat.  It can lead to breathing problems, paralysis, heart failure, and death. Before vaccines, as many as 200,000 cases of diphtheria and hundreds of cases of tetanus were reported in the [place] each year. Since vaccination began, reports of cases for both diseases  have dropped by about 99%. 2. Td vaccine Td vaccine can protect adolescents and adults from tetanus and diphtheria. Td is usually given as a booster dose every 10 years but it can also be given earlier after a severe and dirty wound or burn. Another vaccine, called Tdap, which protects against pertussis in addition to tetanus and diphtheria, is sometimes recommended instead of Td vaccine. Your doctor or the person giving you the vaccine can give you more information. Td may safely be given at the same time as other vaccines. 3. Some people should not get this vaccine  A person who has ever had a life-threatening allergic reaction after a previous dose of any tetanus or diphtheria containing vaccine, OR has a severe allergy to any part of this vaccine, should not get Td vaccine. Tell the person giving the vaccine about any severe allergies.  Talk to your doctor if you:  had severe pain or swelling after any vaccine containing diphtheria or tetanus,  ever had a condition called Guillain Barre Syndrome (GBS),  aren't feeling well on the day the shot is scheduled. 4. What are the risks from Td vaccine? With any medicine, including vaccines, there is a chance of side effects. These are usually mild and go away on their own. Serious reactions are also possible but are rare. Most people who get Td vaccine do not have any problems with it. Mild problems following Td vaccine: (Did not interfere with activities)  Pain where the shot was given (about 8 people in 10)  Redness or swelling where the shot was given (about 1 person in 4)  Mild fever (rare)  Headache (about 1 person in 4)  Tiredness (about 1 person in 4) Moderate problems following Td vaccine: (Interfered with activities, but did not require medical attention)  Fever over 102F (rare) Severe problems following Td vaccine: (Unable to perform usual activities; required medical attention)  Swelling, severe pain, bleeding and/or  redness in the arm where the shot was given (rare). Problems that could happen after any vaccine:  People sometimes faint after a medical procedure, including vaccination. Sitting or lying down for about 15 minutes can help prevent fainting, and injuries caused by a fall. Tell your doctor if you feel dizzy, or have vision changes or ringing in the ears.  Some people get severe pain in the shoulder and have difficulty moving the arm where a shot was given. This happens very rarely.  Any medication can cause a severe allergic reaction. Such reactions from  a vaccine are very rare, estimated at fewer than 1 in a million doses, and would happen within a few minutes to a few hours after the vaccination. As with any medicine, there is a very remote chance of a vaccine causing a serious injury or death. The safety of vaccines is always being monitored. For more information, visit: http://www.aguilar.org/ 5. What if there is a serious reaction? What should I look for? Look for anything that concerns you, such as signs of a severe allergic reaction, very high fever, or unusual behavior. Signs of a severe allergic reaction can include hives, swelling of the face and throat, difficulty breathing, a fast heartbeat, dizziness, and weakness. These would usually start a few minutes to a few hours after the vaccination. What should I do?  If you think it is a severe allergic reaction or other emergency that can't wait, call 9-1-1 or get the person to the nearest hospital. Otherwise, call your doctor.  Afterward, the reaction should be reported to the Vaccine Adverse Event Reporting System (VAERS). Your doctor might file this report, or you can do it yourself through the VAERS web site at www.vaers.SamedayNews.es, or by calling 785-855-2311.  VAERS does not give medical advice. 6. The National Vaccine Injury Compensation Program The [employer] Vaccine Injury Compensation Program (VICP) is a federal program that was  created to compensate people who may have been injured by certain vaccines. Persons who believe they may have been injured by a vaccine can learn about the program and about filing a claim by calling (818) 302-8681 or visiting the [place] website at GoldCloset.com.ee. There is a time limit to file a claim for compensation. 7. How can I learn more?  Ask your doctor. He or she can give you the vaccine package insert or suggest other sources of information.  Call your local or state health department.  Contact the Centers for Disease Control and Prevention (CDC):  Call 262 066 4208 (1-800-CDC-INFO)  Visit CDC's website at http://hunter.com/ CDC Td Vaccine VIS (10/20/15) This information is not intended to replace advice given to you by your health care provider. Make sure you discuss any questions you have with your health care provider. Document Released: 04/24/2006 Document Revised: 03/17/2016 Document Reviewed: 03/17/2016 Elsevier Interactive Patient Education  2017 [employer].

## 2016-08-15 NOTE — Progress Notes (Signed)
Vanessa [name], is a 61 y.o. female  EPP:[id number]  [id number]  DOB - 03/01/1956  Chief Complaint  Patient presents with  . Hypertension        Subjective:   Vanessa [name] is a 61 y.o. female here today for a follow up visit for htn, dm2, morbid obesity, hld. Last time seen 03/24/16. Pt w/o complaints, may have missed dose of her bp med recently. She recently picked up refills on her meds today.  Her cbg in am 170-200s, afternoon cbgs 200s typically. no hypoglycemic events noted  Patient has No headache, No chest pain, No abdominal pain - No Nausea, No new weakness tingling or numbness, No Cough - SOB.  She is here w/ her husband.  No problems updated.  ALLERGIES: No Known Allergies  PAST MEDICAL HISTORY: Past Medical History:  Diagnosis Date  . Asthma   . Diabetes mellitus without complication ([place])   . Heart murmur   . Hypertension     MEDICATIONS AT HOME: Prior to Admission medications   Medication Sig Start Date End Date Taking? Authorizing Provider  amLODipine (NORVASC) 10 MG tablet Take 1 tablet (10 mg total) by mouth daily. 08/15/16   Maren Reamer, MD  aspirin 81 MG tablet Take 1 tablet (81 mg total) by mouth daily. 03/24/16   Maren Reamer, MD  atorvastatin (LIPITOR) 40 MG tablet Take 1 tablet (40 mg total) by mouth daily. 08/15/16   Maren Reamer, MD  Blood Glucose Monitoring Suppl (TRUE METRIX METER) w/Device KIT Check sugar before meals for E11.9 08/03/15   Lance Bosch, NP  fluticasone (FLONASE) 50 MCG/ACT nasal spray Place 2 sprays into both nostrils daily. 07/24/15   Lance Bosch, NP  glucose blood (TRUE METRIX BLOOD GLUCOSE TEST) test strip Check sugar before meals for E11.9 01/07/16   Tresa Garter, MD  hydrochlorothiazide (HYDRODIURIL) 25 MG tablet Take 1 tablet (25 mg total) by mouth daily. 08/15/16   Maren Reamer, MD  insulin NPH-regular Human (NOVOLIN 70/30) (70-30) 100 UNIT/ML injection Take 46 units in the morning and 42 units at  night. 08/15/16   Maren Reamer, MD  INSULIN SYRINGE 1CC/29G 29G X 1/2" 1 ML MISC Insulin syringes 03/06/14   Courtney Forcucci, PA-C  lisinopril (PRINIVIL,ZESTRIL) 40 MG tablet Take 1 tablet (40 mg total) by mouth daily. 08/15/16   Maren Reamer, MD  loratadine (CLARITIN) 10 MG tablet Take 1 tablet (10 mg total) by mouth daily. 07/24/15   Lance Bosch, NP  TRUEPLUS LANCETS 28G MISC Check sugar before meals for E11.9 08/03/15   Lance Bosch, NP     Objective:   Vitals:   08/15/16 1637  BP: (!) 159/81  Pulse: 91  Resp: 16  Temp: 98.7 F (37.1 C)  TempSrc: Oral  SpO2: 98%  Weight: 242 lb 6.4 oz (110 kg)    Exam General appearance : Awake, alert, not in any distress. Speech Clear. Not toxic looking, morbid obese.  HEENT: Atraumatic and Normocephalic, pupils equally reactive to light. Neck: supple, no JVD.  Chest:Good air entry bilaterally, no added sounds. CVS: S1 S2 regular, no murmurs/gallups or rubs. Abdomen: Bowel sounds active, obese, Non tender and not distended with no gaurding, rigidity or rebound. Limited exam due to body habitus. Extremities: B/L Lower Ext shows no edema, both legs are warm to touch Neurology: Awake alert, and oriented X 3, CN II-XII grossly intact, Non focal Skin:No Rash  Data Review Lab Results  Component Value  Date   HGBA1C 7.6 08/15/2016   HGBA1C 8.3 03/24/2016   HGBA1C 10.3 12/21/2015    Depression screen PHQ 2/9 08/15/2016 03/24/2016 12/21/2015 03/23/2015 04/06/2014  Decreased Interest 1 3 2 1  0  Down, Depressed, Hopeless 1 3 2 1 1   PHQ - 2 Score 2 6 4 2 1   Altered sleeping 3 3 3 3  -  Tired, decreased energy 3 3 3 3  -  Change in appetite 3 3 3 3  -  Feeling bad or failure about yourself  1 2 3 2  -  Trouble concentrating 3 2 2 2  -  Moving slowly or fidgety/restless 1 2 3 1  -  Suicidal thoughts 2 0 3 0 -  PHQ-9 Score 18 21 24 16  -      Assessment & Plan   1. Type 2 diabetes mellitus with complication, with long-term current use of  insulin (HCC) Bit better controlled, dw low carb diet, increase exercise - increase nph 70/30 to 46units qam and 42units qpm. - POCT glucose (manual entry) 187 - POCT glycosylated hemoglobin (Hb A1C)  7.6 - BASIC METABOLIC PANEL WITH GFR - Ambulatory referral to Ophthalmology - [facility] bp and cbg check in 2 wks, may need readjustments. - insulin NPH-regular Human (NOVOLIN 70/30) (70-30) 100 UNIT/ML injection; Take 46 units in the morning and 42 units at night.  Dispense: 20 mL; Refill: 3  2. Essential hypertension - elevated, goal <130/80 - she just picked up her refills today, so will hold off on making changes to save $. - lisinopril (PRINIVIL,ZESTRIL) 40 MG tablet; Take 1 tablet (40 mg total) by mouth daily.  Dispense: 90 tablet; Refill: 3 - hctz 25 qd - norvasc 10 qd - if repeat elevated in 2 wks, would recd dc lisinopril/hctz and start diovan-hctz combo, more room to increase dose. - low salt diet advised, increase exercise!  3.  Colon cancer screening - Ambulatory referral to Gastroenterology  4. Breast cancer screening - MM Digital Screening; Future  5. tdap today.    Patient have been counseled extensively about nutrition and exercise  Return in about 3 months (around 11/12/2016), or if symptoms worsen or fail to improve.  The patient was given clear instructions to go to ER or return to medical center if symptoms don't improve, worsen or new problems develop. The patient verbalized understanding. The patient was told to call to get lab results if they haven't heard anything in the next week.   This note has been created with Surveyor, quantity. Any transcriptional errors are unintentional.   Maren Reamer, MD, [facility], [place]   08/15/2016, 5:21 PM

## 2016-08-18 ENCOUNTER — Telehealth: Payer: Self-pay

## 2016-08-18 NOTE — Telephone Encounter (Signed)
Contacted pt to go over lab results pt is aware of results and doesn't have any questions or concerns 

## 2016-08-24 ENCOUNTER — Encounter: Payer: Self-pay | Admitting: Internal Medicine

## 2016-08-30 ENCOUNTER — Ambulatory Visit: Payer: BLUE CROSS/BLUE SHIELD | Admitting: Pharmacist

## 2016-09-05 ENCOUNTER — Other Ambulatory Visit: Payer: Self-pay | Admitting: Internal Medicine

## 2016-09-22 ENCOUNTER — Encounter: Payer: Self-pay | Admitting: Internal Medicine

## 2016-11-15 ENCOUNTER — Ambulatory Visit: Payer: BLUE CROSS/BLUE SHIELD | Admitting: Internal Medicine

## 2016-11-24 ENCOUNTER — Encounter: Payer: Self-pay | Admitting: Internal Medicine

## 2016-11-25 ENCOUNTER — Encounter: Payer: Self-pay | Admitting: Internal Medicine

## 2016-11-28 ENCOUNTER — Encounter: Payer: Self-pay | Admitting: Internal Medicine

## 2016-11-29 ENCOUNTER — Ambulatory Visit: Payer: BLUE CROSS/BLUE SHIELD | Attending: Internal Medicine | Admitting: Internal Medicine

## 2016-11-29 VITALS — BP 117/69 | HR 84 | Temp 98.1°F | Resp 16 | Wt 241.8 lb

## 2016-11-29 DIAGNOSIS — E785 Hyperlipidemia, unspecified: Secondary | ICD-10-CM | POA: Diagnosis not present

## 2016-11-29 DIAGNOSIS — Z794 Long term (current) use of insulin: Secondary | ICD-10-CM

## 2016-11-29 DIAGNOSIS — E118 Type 2 diabetes mellitus with unspecified complications: Secondary | ICD-10-CM

## 2016-11-29 DIAGNOSIS — I10 Essential (primary) hypertension: Secondary | ICD-10-CM | POA: Diagnosis not present

## 2016-11-29 DIAGNOSIS — Z6841 Body Mass Index (BMI) 40.0 and over, adult: Secondary | ICD-10-CM | POA: Insufficient documentation

## 2016-11-29 LAB — POCT GLYCOSYLATED HEMOGLOBIN (HGB A1C): HEMOGLOBIN A1C: 8.4

## 2016-11-29 LAB — GLUCOSE, POCT (MANUAL RESULT ENTRY): POC Glucose: 173 mg/dl — AB (ref 70–99)

## 2016-11-29 MED ORDER — ATORVASTATIN CALCIUM 40 MG PO TABS: 40.0000 mg | ORAL_TABLET | Freq: Every day | ORAL | 3 refills | Status: DC

## 2016-11-29 MED ORDER — LORATADINE 10 MG PO TABS: 10.0000 mg | ORAL_TABLET | Freq: Every day | ORAL | 11 refills | Status: DC

## 2016-11-29 MED ORDER — AMLODIPINE BESYLATE 10 MG PO TABS: 10.0000 mg | ORAL_TABLET | Freq: Every day | ORAL | 3 refills | Status: DC

## 2016-11-29 MED ORDER — LISINOPRIL 40 MG PO TABS: 40.0000 mg | ORAL_TABLET | Freq: Every day | ORAL | 3 refills | Status: DC

## 2016-11-29 MED ORDER — ASPIRIN 81 MG PO TABS: 81.0000 mg | ORAL_TABLET | Freq: Every day | ORAL | 3 refills | Status: DC

## 2016-11-29 MED ORDER — FLUTICASONE PROPIONATE 50 MCG/ACT NA SUSP: 2.0000 | Freq: Every day | NASAL | 3 refills | Status: DC

## 2016-11-29 MED ORDER — ONETOUCH ULTRA SYSTEM W/DEVICE KIT: 1.0000 | PACK | Freq: Once | 0 refills | Status: AC

## 2016-11-29 MED ORDER — ONETOUCH ULTRASOFT LANCETS MISC: 12 refills | Status: DC

## 2016-11-29 MED ORDER — METFORMIN HCL ER 500 MG PO TB24: 500.0000 mg | ORAL_TABLET | Freq: Every day | ORAL | 3 refills | Status: DC

## 2016-11-29 MED ORDER — GLUCOSE BLOOD VI STRP: ORAL_STRIP | 12 refills | Status: DC

## 2016-11-29 MED ORDER — INSULIN NPH ISOPHANE & REGULAR (70-30) 100 UNIT/ML ~~LOC~~ SUSP: SUBCUTANEOUS | 3 refills | Status: DC

## 2016-11-29 MED ORDER — HYDROCHLOROTHIAZIDE 25 MG PO TABS: 25.0000 mg | ORAL_TABLET | Freq: Every day | ORAL | 3 refills | Status: DC

## 2016-11-29 MED ORDER — METFORMIN HCL ER 500 MG PO TB24: 500.0000 mg | ORAL_TABLET | Freq: Two times a day (BID) | ORAL | 3 refills | Status: DC

## 2016-11-29 NOTE — Progress Notes (Signed)
Vanessa [name], is a 61 y.o. female  EQA:[id number]  [id number]:[id number]  DOB - [birth year]  Chief Complaint  Patient presents with  . Hypertension        Subjective:   Vanessa [name] is a 61 y.o. female here today for a follow up visit, last seen 08/15/16, w/ hx of htn, dm2, morbid obesity and hld. She notes that she has stopped taking her metformin, but taking her friend's Junemet 1 tab bid w/o problems.  She is not watching her diet as well as she could, lots of cookies, carbs, rice, pasta; sedentary lifestyle.  She notes her sugars run 160-180s in am, and 130s in afternoon. She tends to snack late at night as well.  Pt notes had eye exam in March, does not remember if they said anything about dm retinopathy, but noted some type of scar and recd eye drops.  She needs to go back soon.  Does not smoke  Declined papsmear offered today. Still needs to get her Mm and colonoscopy, but she is holding off on it due to insurance problems and cost.  Patient has No headache, No chest pain, No abdominal pain - No Nausea, No new weakness tingling or numbness, No Cough - SOB.  Here w/ her husband.  No problems updated.  ALLERGIES: No Known Allergies  PAST MEDICAL HISTORY: Past Medical History:  Diagnosis Date  . Asthma   . Diabetes mellitus without complication ([place])   . Heart murmur   . Hypertension     MEDICATIONS AT HOME: Prior to Admission medications   Medication Sig Start Date End Date Taking? Authorizing Provider  amLODipine (NORVASC) 10 MG tablet Take 1 tablet (10 mg total) by mouth daily. 11/29/16   Maren Reamer, MD  aspirin 81 MG tablet Take 1 tablet (81 mg total) by mouth daily. 11/29/16   Maren Reamer, MD  atorvastatin (LIPITOR) 40 MG tablet Take 1 tablet (40 mg total) by mouth daily. 11/29/16   Maren Reamer, MD  Blood Glucose Monitoring Suppl (ONE TOUCH ULTRA SYSTEM KIT) w/Device KIT 1 kit by Does not apply route once. 11/29/16 11/29/16  Maren Reamer, MD    fluticasone (FLONASE) 50 MCG/ACT nasal spray Place 2 sprays into both nostrils daily. 11/29/16   Lottie Mussel T, MD  glucose blood test strip Use as instructed 11/29/16   Lottie Mussel T, MD  hydrochlorothiazide (HYDRODIURIL) 25 MG tablet Take 1 tablet (25 mg total) by mouth daily. 11/29/16   Maren Reamer, MD  insulin NPH-regular Human (NOVOLIN 70/30) (70-30) 100 UNIT/ML injection Take 50 units in the morning and 45 units at night. 11/29/16   Lottie Mussel T, MD  INSULIN SYRINGE 1CC/29G 29G X 1/2" 1 ML MISC Insulin syringes 03/06/14   Forcucci, Courtney, PA-C  Lancets (ONETOUCH ULTRASOFT) lancets Use as instructed 11/29/16   Lottie Mussel T, MD  lisinopril (PRINIVIL,ZESTRIL) 40 MG tablet Take 1 tablet (40 mg total) by mouth daily. 11/29/16   Maren Reamer, MD  loratadine (CLARITIN) 10 MG tablet Take 1 tablet (10 mg total) by mouth daily. 11/29/16   Maren Reamer, MD  metFORMIN (GLUCOPHAGE XR) 500 MG 24 hr tablet Take 1 tablet (500 mg total) by mouth daily with breakfast. 11/29/16   Lottie Mussel T, MD     Objective:   Vitals:   11/29/16 1414  BP: 117/69  Pulse: 84  Resp: 16  Temp: 98.1 F (36.7 C)  TempSrc: Oral  SpO2: 98%  Weight: 241 lb 12.8  oz (109.7 kg)    Exam General appearance : Awake, alert, not in any distress. Speech Clear. Not toxic looking, morbid obese. HEENT: Atraumatic and Normocephalic, pupils equally reactive to light. Neck: supple, no JVD.  Chest:Good air entry bilaterally, no added sounds. CVS: S1 S2 regular, no murmurs/gallups or rubs. Abdomen: Bowel sounds active, Non tender, obese, and not distended with no gaurding, rigidity or rebound. Foot exam: bilateral peripheral pulses 2+ (dorsalis pedis and post tibialis pulses), no ulcers noted/no ecchymosis, warm to touch, monofilament testing 3/3 bilat. Sensation intact.  No c/c/e. Noted elongated, thick nails both feet. Neurology: Awake alert, and oriented X 3, CN II-XII grossly intact, Non  focal Skin:No Rash  Data Review Lab Results  Component Value Date   HGBA1C 8.4 11/29/2016   HGBA1C 7.6 08/15/2016   HGBA1C 8.3 03/24/2016    Depression screen PHQ 2/9 11/29/2016 08/15/2016 03/24/2016 12/21/2015 03/23/2015  Decreased Interest 1 1 3 2 1   Down, Depressed, Hopeless 2 1 3 2 1   PHQ - 2 Score 3 2 6 4 2   Altered sleeping 3 3 3 3 3   Tired, decreased energy 3 3 3 3 3   Change in appetite 3 3 3 3 3   Feeling bad or failure about yourself  3 1 2 3 2   Trouble concentrating 2 3 2 2 2   Moving slowly or fidgety/restless 1 1 2 3 1   Suicidal thoughts 0 2 0 3 0  PHQ-9 Score 18 18 21 24 16       Assessment & Plan   1. Type 2 diabetes mellitus with complication, with long-term current use of insulin (HCC) - needs better control, recd less snacks at night, stop her friends janumet for now. - start metformin xl 500bid. - increase exercise, less snacks at night recd. - consider starting Jardiance but not on formulary. Consider victoza next visit - POCT glucose (manual entry) - POCT glycosylated hemoglobin (Hb A1C) 8.4 (from 7.6) - fu [facility] 2 wks for dm check. - increase nph 7/30 to 50 U qam and 45 u qpm  2. Dyslipidemia - aspirin 81 MG tablet; Take 1 tablet (81 mg total) by mouth daily.  Dispense: 90 tablet; Refill: 3 - continue lipitor 40 qhs  3. Essential hypertension - well controlled now. - lisinopril (PRINIVIL,ZESTRIL) 40 MG tablet; Take 1 tablet (40 mg total) by mouth daily.  Dispense: 90 tablet; Refill: 3 - norvasc 10 qd renewed - low salt diet encouraged.   4. Needs pap - offered today, she declined. Recd fu in 1 month to do - referral to gi  And MM has already been placed, she is holding off on colonoscopy and MM screening due to financial reasons - encouraged her to get done when her insurance is uptodate Patient have been counseled extensively about nutrition and exercise  Return in about 4 weeks (around 12/27/2016) for papsmear.  The patient was given  clear instructions to go to ER or return to medical center if symptoms don't improve, worsen or new problems develop. The patient verbalized understanding. The patient was told to call to get lab results if they haven't heard anything in the next week.   This note has been created with Surveyor, quantity. Any transcriptional errors are unintentional.   Maren Reamer, MD, [place] [facility], [place]   11/29/2016, 3:02 PM

## 2016-11-29 NOTE — Patient Instructions (Addendum)
Alvera Singh Dm check 2 wks - appt  - Hydrocortisone 1% over the counter for itching.  Need to get Papsmear next time, also needs colonoscopy and Mammogram screening.  Diabetes Mellitus and Food It is important for you to manage your blood sugar (glucose) level. Your blood glucose level can be greatly affected by what you eat. Eating healthier foods in the appropriate amounts throughout the day at about the same time each day will help you control your blood glucose level. It can also help slow or prevent worsening of your diabetes mellitus. Healthy eating may even help you improve the level of your blood pressure and reach or maintain a healthy weight. General recommendations for healthful eating and cooking habits include:  Eating meals and snacks regularly. Avoid going long periods of time without eating to lose weight.  Eating a diet that consists mainly of plant-based foods, such as fruits, vegetables, nuts, legumes, and whole grains.  Using low-heat cooking methods, such as baking, instead of high-heat cooking methods, such as deep frying. Work with your dietitian to make sure you understand how to use the Nutrition Facts information on food labels. How can food affect me? Carbohydrates  Carbohydrates affect your blood glucose level more than any other type of food. Your dietitian will help you determine how many carbohydrates to eat at each meal and teach you how to count carbohydrates. Counting carbohydrates is important to keep your blood glucose at a healthy level, especially if you are using insulin or taking certain medicines for diabetes mellitus. Alcohol  Alcohol can cause sudden decreases in blood glucose (hypoglycemia), especially if you use insulin or take certain medicines for diabetes mellitus. Hypoglycemia can be a life-threatening condition. Symptoms of hypoglycemia (sleepiness, dizziness, and disorientation) are similar to symptoms of having too much alcohol. If your health  care provider has given you approval to drink alcohol, do so in moderation and use the following guidelines:  Women should not have more than one drink per day, and men should not have more than two drinks per day. One drink is equal to:  12 oz of beer.  5 oz of wine.  1 oz of hard liquor.  Do not drink on an empty stomach.  Keep yourself hydrated. Have water, diet soda, or unsweetened iced tea.  Regular soda, juice, and other mixers might contain a lot of carbohydrates and should be counted. What foods are not recommended? As you make food choices, it is important to remember that all foods are not the same. Some foods have fewer nutrients per serving than other foods, even though they might have the same number of calories or carbohydrates. It is difficult to get your body what it needs when you eat foods with fewer nutrients. Examples of foods that you should avoid that are high in calories and carbohydrates but low in nutrients include:  Trans fats (most processed foods list trans fats on the Nutrition Facts label).  Regular soda.  Juice.  Candy.  Sweets, such as cake, pie, doughnuts, and cookies.  Fried foods. What foods can I eat? Eat nutrient-rich foods, which will nourish your body and keep you healthy. The food you should eat also will depend on several factors, including:  The calories you need.  The medicines you take.  Your weight.  Your blood glucose level.  Your blood pressure level.  Your cholesterol level. You should eat a variety of foods, including:  Protein.  Lean cuts of meat.  Proteins low in saturated  fats, such as fish, egg whites, and beans. Avoid processed meats.  Fruits and vegetables.  Fruits and vegetables that may help control blood glucose levels, such as apples, mangoes, and yams.  Dairy products.  Choose fat-free or low-fat dairy products, such as milk, yogurt, and cheese.  Grains, bread, pasta, and rice.  Choose whole  grain products, such as multigrain bread, whole oats, and brown rice. These foods may help control blood pressure.  Fats.  Foods containing healthful fats, such as nuts, avocado, olive oil, canola oil, and fish. Does everyone with diabetes mellitus have the same meal plan? Because every person with diabetes mellitus is different, there is not one meal plan that works for everyone. It is very important that you meet with a dietitian who will help you create a meal plan that is just right for you. This information is not intended to replace advice given to you by your health care provider. Make sure you discuss any questions you have with your health care provider. Document Released: 03/24/2005 Document Revised: 12/03/2015 Document Reviewed: 05/24/2013 Elsevier Interactive Patient Education  2017 Elsevier [employer]. ]- - Bleach Baths to Help Prevent Staph Infections All children will occasionally get impetigo (often given the misnomer of "infantigo"), a staph or strep infection that gains entry under the skin by itching, scratching, picking bug bites, etc. Impetigo has always been around as a common skin infection in kids but we now commonly see more serious infections caused by a particularly nasty form of staph known as MRSA, which stands for "Methicillin-resistant Staph aureus", and which often causes abscesses under the skin. These infections sometimes require surgical draining in the office or in the hospital under anesthesia to heal them, as well as powerful and sometimes unpleasant-tasting antibiotics. One new treatment that can help to drastically decrease the number of these infections and possibly reduce the need for antibiotics in kids with staph is the use of "Bleach Baths." Here is a simple recommendation that research has shown effective in preventing recurrences of staph infections in your child and sometimes the entire family: STEPS:  1. Start by adding lukewarm water to fill a tub for a  normal bath (about 40 gallons, or at least half a tub of water).  2. Put 1/4 to 1/2 cup of common liquid bleach (for example, Clorox) into the bath water. Check the bleach bottle to make sure that the concentration of bleach (also known as sodium hypochlorite) is about 6%, and not scented bleach.  3. Completely mix the added bleach in the water. This should create a solution of diluted bleach (about 0.005%), which is only a little stronger than chlorinated public swimming pool water (your home pool is much less chlorinated).  4. Soak in the chlorinated water for about 10 minutes, bathing as usual with soap as this extra-chlorinated water will not harm sensitive tissues.  5. Thoroughly rinse the skin clear with lukewarm, fresh water at the end of the bath, just as you do after swimming to get the chlorine odor off the skin.  6. As soon as your child (or you) has finished rinsing off, pat the skin dry and be sure to wash all towels and washcloths before reusing.  7. Repeat bleach baths 2 to 3 times a week for a couple of weeks and if this has been a recurrent problem, once a month bleach baths for the whole family will help keep your child and you free of staph. The following restrictions may apply:  Do not  use undiluted bleach directly on the skin. Even diluted bleach baths can potentially cause dryness and/or irritation. Do not use bleach baths if there are many breaks or open areas in the skin unless you are instructed to do so (for fear of intense stinging and burning). Do not use bleach baths in patients with a known contact allergy to chlorine.  -

## 2016-12-01 ENCOUNTER — Other Ambulatory Visit: Payer: Self-pay | Admitting: Pharmacist

## 2016-12-01 MED ORDER — BAYER CONTOUR MONITOR W/DEVICE KIT
PACK | 0 refills | Status: DC
Start: 2016-12-01 — End: 2020-04-30

## 2016-12-01 MED ORDER — GLUCOSE BLOOD VI STRP: ORAL_STRIP | 12 refills | Status: DC

## 2016-12-13 ENCOUNTER — Ambulatory Visit: Payer: BLUE CROSS/BLUE SHIELD | Admitting: Pharmacist

## 2016-12-28 ENCOUNTER — Ambulatory Visit: Payer: BLUE CROSS/BLUE SHIELD | Admitting: Podiatry

## 2016-12-30 ENCOUNTER — Ambulatory Visit: Payer: BLUE CROSS/BLUE SHIELD | Admitting: Internal Medicine

## 2017-04-10 ENCOUNTER — Ambulatory Visit: Payer: BLUE CROSS/BLUE SHIELD | Admitting: Internal Medicine

## 2017-05-11 ENCOUNTER — Ambulatory Visit: Payer: Self-pay | Admitting: Family Medicine

## 2019-09-09 DIAGNOSIS — B9561 Methicillin susceptible Staphylococcus aureus infection as the cause of diseases classified elsewhere: Secondary | ICD-10-CM

## 2019-09-09 HISTORY — DX: Methicillin susceptible Staphylococcus aureus infection as the cause of diseases classified elsewhere: B95.61

## 2019-09-12 ENCOUNTER — Ambulatory Visit: Payer: Medicare Other | Attending: Internal Medicine

## 2019-09-12 DIAGNOSIS — Z23 Encounter for immunization: Secondary | ICD-10-CM | POA: Insufficient documentation

## 2019-09-12 NOTE — Progress Notes (Signed)
   Covid-19 Vaccination Clinic  Name:  Vanessa [name]    MRN: [id number] DOB: March 02, 1956  09/12/2019  Vanessa [name] was observed post Covid-19 immunization for 15 minutes without incident. She was provided with Vaccine Information Sheet and instruction to access the V-Safe system.   Vanessa [name] was instructed to call 911 with any severe reactions post vaccine: Marland Kitchen Difficulty breathing  . Swelling of face and throat  . A fast heartbeat  . A bad rash all over body  . Dizziness and weakness   Immunizations Administered    Name Date Dose VIS Date Route   Pfizer COVID-19 Vaccine 09/12/2019  3:33 PM 0.3 mL 06/21/2019 Intramuscular   Manufacturer: [place]   Lot: [id number]   [place]: [id number]

## 2019-09-14 ENCOUNTER — Emergency Department (HOSPITAL_COMMUNITY): Payer: Medicare Other

## 2019-09-14 ENCOUNTER — Inpatient Hospital Stay (HOSPITAL_COMMUNITY): Payer: Medicare Other

## 2019-09-14 ENCOUNTER — Other Ambulatory Visit: Payer: Self-pay

## 2019-09-14 ENCOUNTER — Inpatient Hospital Stay (HOSPITAL_COMMUNITY)
Admission: EM | Admit: 2019-09-14 | Discharge: 2019-09-25 | DRG: 988 | Disposition: A | Discharge status: Skilled Nursing Facility | Payer: Medicare Other | Attending: Internal Medicine | Admitting: Internal Medicine

## 2019-09-14 DIAGNOSIS — R52 Pain, unspecified: Secondary | ICD-10-CM | POA: Diagnosis not present

## 2019-09-14 DIAGNOSIS — I129 Hypertensive chronic kidney disease with stage 1 through stage 4 chronic kidney disease, or unspecified chronic kidney disease: Secondary | ICD-10-CM | POA: Diagnosis present

## 2019-09-14 DIAGNOSIS — L8990 Pressure ulcer of unspecified site, unspecified stage: Secondary | ICD-10-CM | POA: Diagnosis not present

## 2019-09-14 DIAGNOSIS — I340 Nonrheumatic mitral (valve) insufficiency: Secondary | ICD-10-CM | POA: Diagnosis not present

## 2019-09-14 DIAGNOSIS — D62 Acute posthemorrhagic anemia: Secondary | ICD-10-CM | POA: Diagnosis present

## 2019-09-14 DIAGNOSIS — L89892 Pressure ulcer of other site, stage 2: Secondary | ICD-10-CM | POA: Diagnosis not present

## 2019-09-14 DIAGNOSIS — Z79899 Other long term (current) drug therapy: Secondary | ICD-10-CM | POA: Diagnosis not present

## 2019-09-14 DIAGNOSIS — D696 Thrombocytopenia, unspecified: Secondary | ICD-10-CM | POA: Diagnosis not present

## 2019-09-14 DIAGNOSIS — Z833 Family history of diabetes mellitus: Secondary | ICD-10-CM

## 2019-09-14 DIAGNOSIS — Z20822 Contact with and (suspected) exposure to covid-19: Secondary | ICD-10-CM | POA: Diagnosis present

## 2019-09-14 DIAGNOSIS — D500 Iron deficiency anemia secondary to blood loss (chronic): Secondary | ICD-10-CM

## 2019-09-14 DIAGNOSIS — N189 Chronic kidney disease, unspecified: Secondary | ICD-10-CM | POA: Diagnosis not present

## 2019-09-14 DIAGNOSIS — I10 Essential (primary) hypertension: Secondary | ICD-10-CM | POA: Diagnosis not present

## 2019-09-14 DIAGNOSIS — I82612 Acute embolism and thrombosis of superficial veins of left upper extremity: Secondary | ICD-10-CM | POA: Diagnosis present

## 2019-09-14 DIAGNOSIS — W19XXXD Unspecified fall, subsequent encounter: Secondary | ICD-10-CM | POA: Diagnosis present

## 2019-09-14 DIAGNOSIS — E1169 Type 2 diabetes mellitus with other specified complication: Secondary | ICD-10-CM | POA: Diagnosis present

## 2019-09-14 DIAGNOSIS — N939 Abnormal uterine and vaginal bleeding, unspecified: Secondary | ICD-10-CM

## 2019-09-14 DIAGNOSIS — Z823 Family history of stroke: Secondary | ICD-10-CM | POA: Diagnosis not present

## 2019-09-14 DIAGNOSIS — Z794 Long term (current) use of insulin: Secondary | ICD-10-CM | POA: Diagnosis not present

## 2019-09-14 DIAGNOSIS — E790 Hyperuricemia without signs of inflammatory arthritis and tophaceous disease: Secondary | ICD-10-CM | POA: Diagnosis not present

## 2019-09-14 DIAGNOSIS — Z8249 Family history of ischemic heart disease and other diseases of the circulatory system: Secondary | ICD-10-CM

## 2019-09-14 DIAGNOSIS — N179 Acute kidney failure, unspecified: Secondary | ICD-10-CM | POA: Diagnosis present

## 2019-09-14 DIAGNOSIS — C541 Malignant neoplasm of endometrium: Secondary | ICD-10-CM | POA: Diagnosis present

## 2019-09-14 DIAGNOSIS — L299 Pruritus, unspecified: Secondary | ICD-10-CM | POA: Diagnosis present

## 2019-09-14 DIAGNOSIS — E669 Obesity, unspecified: Secondary | ICD-10-CM | POA: Diagnosis present

## 2019-09-14 DIAGNOSIS — R7881 Bacteremia: Secondary | ICD-10-CM | POA: Diagnosis present

## 2019-09-14 DIAGNOSIS — E875 Hyperkalemia: Secondary | ICD-10-CM | POA: Diagnosis present

## 2019-09-14 DIAGNOSIS — L8995 Pressure ulcer of unspecified site, unstageable: Secondary | ICD-10-CM | POA: Diagnosis not present

## 2019-09-14 DIAGNOSIS — E1122 Type 2 diabetes mellitus with diabetic chronic kidney disease: Secondary | ICD-10-CM | POA: Diagnosis present

## 2019-09-14 DIAGNOSIS — E872 Acidosis: Secondary | ICD-10-CM | POA: Diagnosis present

## 2019-09-14 DIAGNOSIS — E876 Hypokalemia: Secondary | ICD-10-CM | POA: Diagnosis present

## 2019-09-14 DIAGNOSIS — D649 Anemia, unspecified: Secondary | ICD-10-CM | POA: Diagnosis not present

## 2019-09-14 DIAGNOSIS — B9561 Methicillin susceptible Staphylococcus aureus infection as the cause of diseases classified elsewhere: Secondary | ICD-10-CM | POA: Diagnosis not present

## 2019-09-14 DIAGNOSIS — N950 Postmenopausal bleeding: Secondary | ICD-10-CM | POA: Diagnosis present

## 2019-09-14 DIAGNOSIS — W19XXXA Unspecified fall, initial encounter: Secondary | ICD-10-CM

## 2019-09-14 DIAGNOSIS — E7800 Pure hypercholesterolemia, unspecified: Secondary | ICD-10-CM | POA: Diagnosis present

## 2019-09-14 DIAGNOSIS — E119 Type 2 diabetes mellitus without complications: Secondary | ICD-10-CM | POA: Diagnosis not present

## 2019-09-14 DIAGNOSIS — Z6832 Body mass index (BMI) 32.0-32.9, adult: Secondary | ICD-10-CM

## 2019-09-14 DIAGNOSIS — N289 Disorder of kidney and ureter, unspecified: Secondary | ICD-10-CM | POA: Diagnosis not present

## 2019-09-14 DIAGNOSIS — I160 Hypertensive urgency: Secondary | ICD-10-CM

## 2019-09-14 DIAGNOSIS — Z7982 Long term (current) use of aspirin: Secondary | ICD-10-CM

## 2019-09-14 DIAGNOSIS — D6959 Other secondary thrombocytopenia: Secondary | ICD-10-CM | POA: Diagnosis present

## 2019-09-14 DIAGNOSIS — E1121 Type 2 diabetes mellitus with diabetic nephropathy: Secondary | ICD-10-CM | POA: Diagnosis not present

## 2019-09-14 DIAGNOSIS — N1832 Chronic kidney disease, stage 3b: Secondary | ICD-10-CM | POA: Diagnosis present

## 2019-09-14 DIAGNOSIS — D72829 Elevated white blood cell count, unspecified: Secondary | ICD-10-CM | POA: Diagnosis not present

## 2019-09-14 DIAGNOSIS — S42302D Unspecified fracture of shaft of humerus, left arm, subsequent encounter for fracture with routine healing: Secondary | ICD-10-CM

## 2019-09-14 DIAGNOSIS — K0889 Other specified disorders of teeth and supporting structures: Secondary | ICD-10-CM | POA: Diagnosis present

## 2019-09-14 DIAGNOSIS — K224 Dyskinesia of esophagus: Secondary | ICD-10-CM | POA: Diagnosis present

## 2019-09-14 DIAGNOSIS — R609 Edema, unspecified: Secondary | ICD-10-CM | POA: Diagnosis not present

## 2019-09-14 DIAGNOSIS — N183 Chronic kidney disease, stage 3 unspecified: Secondary | ICD-10-CM

## 2019-09-14 DIAGNOSIS — M7989 Other specified soft tissue disorders: Secondary | ICD-10-CM | POA: Diagnosis not present

## 2019-09-14 DIAGNOSIS — R1310 Dysphagia, unspecified: Secondary | ICD-10-CM

## 2019-09-14 DIAGNOSIS — S42302A Unspecified fracture of shaft of humerus, left arm, initial encounter for closed fracture: Secondary | ICD-10-CM

## 2019-09-14 DIAGNOSIS — L8992 Pressure ulcer of unspecified site, stage 2: Secondary | ICD-10-CM | POA: Diagnosis not present

## 2019-09-14 DIAGNOSIS — E785 Hyperlipidemia, unspecified: Secondary | ICD-10-CM | POA: Diagnosis present

## 2019-09-14 DIAGNOSIS — J45909 Unspecified asthma, uncomplicated: Secondary | ICD-10-CM | POA: Diagnosis present

## 2019-09-14 DIAGNOSIS — K222 Esophageal obstruction: Secondary | ICD-10-CM | POA: Diagnosis present

## 2019-09-14 DIAGNOSIS — S42295A Other nondisplaced fracture of upper end of left humerus, initial encounter for closed fracture: Secondary | ICD-10-CM | POA: Diagnosis not present

## 2019-09-14 DIAGNOSIS — I361 Nonrheumatic tricuspid (valve) insufficiency: Secondary | ICD-10-CM | POA: Diagnosis not present

## 2019-09-14 LAB — IRON AND TIBC
Iron: 115 ug/dL (ref 28–170)
Saturation Ratios: 31 % (ref 10.4–31.8)
TIBC: 374 ug/dL (ref 250–450)
UIBC: 259 ug/dL

## 2019-09-14 LAB — FERRITIN: Ferritin: 10 ng/mL — ABNORMAL LOW (ref 11–307)

## 2019-09-14 LAB — CBC WITH DIFFERENTIAL/PLATELET
Abs Immature Granulocytes: 0.02 10*3/uL (ref 0.00–0.07)
Basophils Absolute: 0 10*3/uL (ref 0.0–0.1)
Basophils Relative: 1 %
Eosinophils Absolute: 0 10*3/uL (ref 0.0–0.5)
Eosinophils Relative: 0 %
HCT: 17.6 % — ABNORMAL LOW (ref 36.0–46.0)
Hemoglobin: 4.8 g/dL — CL (ref 12.0–15.0)
Immature Granulocytes: 0 %
Lymphocytes Relative: 15 %
Lymphs Abs: 1 10*3/uL (ref 0.7–4.0)
MCH: 22.7 pg — ABNORMAL LOW (ref 26.0–34.0)
MCHC: 27.3 g/dL — ABNORMAL LOW (ref 30.0–36.0)
MCV: 83.4 fL (ref 80.0–100.0)
Monocytes Absolute: 0.3 10*3/uL (ref 0.1–1.0)
Monocytes Relative: 4 %
Neutro Abs: 5.4 10*3/uL (ref 1.7–7.7)
Neutrophils Relative %: 80 %
Platelets: 115 10*3/uL — ABNORMAL LOW (ref 150–400)
RBC: 2.11 MIL/uL — ABNORMAL LOW (ref 3.87–5.11)
RDW: 24.1 % — ABNORMAL HIGH (ref 11.5–15.5)
WBC: 6.8 10*3/uL (ref 4.0–10.5)
nRBC: 2.1 % — ABNORMAL HIGH (ref 0.0–0.2)

## 2019-09-14 LAB — PROTIME-INR
INR: 1.1 (ref 0.8–1.2)
Prothrombin Time: 13.8 seconds (ref 11.4–15.2)

## 2019-09-14 LAB — COMPREHENSIVE METABOLIC PANEL
ALT: 19 U/L (ref 0–44)
AST: 31 U/L (ref 15–41)
Albumin: 3 g/dL — ABNORMAL LOW (ref 3.5–5.0)
Alkaline Phosphatase: 80 U/L (ref 38–126)
Anion gap: 10 (ref 5–15)
BUN: 27 mg/dL — ABNORMAL HIGH (ref 8–23)
CO2: 18 mmol/L — ABNORMAL LOW (ref 22–32)
Calcium: 8.2 mg/dL — ABNORMAL LOW (ref 8.9–10.3)
Chloride: 112 mmol/L — ABNORMAL HIGH (ref 98–111)
Creatinine, Ser: 1.9 mg/dL — ABNORMAL HIGH (ref 0.44–1.00)
GFR calc Af Amer: 32 mL/min — ABNORMAL LOW (ref >60)
GFR calc non Af Amer: 27 mL/min — ABNORMAL LOW (ref >60)
Glucose, Bld: 113 mg/dL — ABNORMAL HIGH (ref 70–99)
Potassium: 3.1 mmol/L — ABNORMAL LOW (ref 3.5–5.1)
Sodium: 140 mmol/L (ref 135–145)
Total Bilirubin: 0.6 mg/dL (ref 0.3–1.2)
Total Protein: 7.4 g/dL (ref 6.5–8.1)

## 2019-09-14 LAB — PREPARE RBC (CROSSMATCH)

## 2019-09-14 LAB — TROPONIN I (HIGH SENSITIVITY)
Troponin I (High Sensitivity): 49 ng/L — ABNORMAL HIGH (ref <18)
Troponin I (High Sensitivity): 50 ng/L — ABNORMAL HIGH (ref <18)

## 2019-09-14 LAB — GLUCOSE, CAPILLARY: Glucose-Capillary: 79 mg/dL (ref 70–99)

## 2019-09-14 LAB — HEMOGLOBIN AND HEMATOCRIT, BLOOD
HCT: 16.9 % — ABNORMAL LOW (ref 36.0–46.0)
Hemoglobin: 4.6 g/dL — CL (ref 12.0–15.0)

## 2019-09-14 LAB — RETICULOCYTES
Immature Retic Fract: 37.3 % — ABNORMAL HIGH (ref 2.3–15.9)
RBC.: 2.04 MIL/uL — ABNORMAL LOW (ref 3.87–5.11)
Retic Count, Absolute: 34.3 10*3/uL (ref 19.0–186.0)
Retic Ct Pct: 1.7 % (ref 0.4–3.1)

## 2019-09-14 LAB — POC OCCULT BLOOD, ED: Fecal Occult Bld: POSITIVE — AB

## 2019-09-14 LAB — FOLATE: Folate: 6.9 ng/mL (ref >5.9)

## 2019-09-14 LAB — VITAMIN B12: Vitamin B-12: 1247 pg/mL — ABNORMAL HIGH (ref 180–914)

## 2019-09-14 MED ORDER — SODIUM CHLORIDE 0.9 % IV SOLN
10.0000 mL/h | Freq: Once | INTRAVENOUS | Status: AC
  Administered 2019-09-22: 10 mL/h via INTRAVENOUS

## 2019-09-14 MED ORDER — AMLODIPINE BESYLATE 5 MG PO TABS
10.0000 mg | ORAL_TABLET | Freq: Once | ORAL | Status: AC
  Administered 2019-09-14: 10 mg via ORAL
  Filled 2019-09-14: qty 2

## 2019-09-14 MED ORDER — INSULIN ASPART 100 UNIT/ML ~~LOC~~ SOLN: 0.0000 [IU] | Freq: Every day | SUBCUTANEOUS | Status: DC

## 2019-09-14 MED ORDER — AMLODIPINE BESYLATE 10 MG PO TABS
10.0000 mg | ORAL_TABLET | Freq: Every day | ORAL | Status: DC
  Administered 2019-09-15 – 2019-09-25 (×11): 10 mg via ORAL
  Filled 2019-09-14 (×11): qty 1

## 2019-09-14 MED ORDER — ACETAMINOPHEN 325 MG PO TABS
650.0000 mg | ORAL_TABLET | Freq: Four times a day (QID) | ORAL | Status: DC | PRN
  Administered 2019-09-16 – 2019-09-25 (×14): 650 mg via ORAL
  Filled 2019-09-14 (×15): qty 2

## 2019-09-14 MED ORDER — HYDROCHLOROTHIAZIDE 12.5 MG PO CAPS
25.0000 mg | ORAL_CAPSULE | Freq: Once | ORAL | Status: AC
  Administered 2019-09-14: 25 mg via ORAL
  Filled 2019-09-14: qty 2

## 2019-09-14 MED ORDER — OXYCODONE HCL 5 MG PO TABS
5.0000 mg | ORAL_TABLET | ORAL | Status: DC | PRN
  Administered 2019-09-16 – 2019-09-23 (×5): 5 mg via ORAL
  Filled 2019-09-14 (×6): qty 1

## 2019-09-14 MED ORDER — ACETAMINOPHEN 650 MG RE SUPP: 650.0000 mg | Freq: Four times a day (QID) | RECTAL | Status: DC | PRN

## 2019-09-14 MED ORDER — ATORVASTATIN CALCIUM 40 MG PO TABS
40.0000 mg | ORAL_TABLET | Freq: Every day | ORAL | Status: DC
  Administered 2019-09-14 – 2019-09-25 (×12): 40 mg via ORAL
  Filled 2019-09-14 (×12): qty 1

## 2019-09-14 MED ORDER — LISINOPRIL 20 MG PO TABS
40.0000 mg | ORAL_TABLET | Freq: Once | ORAL | Status: AC
  Administered 2019-09-14: 17:00:00 40 mg via ORAL
  Filled 2019-09-14: qty 2

## 2019-09-14 MED ORDER — POTASSIUM CHLORIDE CRYS ER 20 MEQ PO TBCR
20.0000 meq | EXTENDED_RELEASE_TABLET | Freq: Once | ORAL | Status: AC
  Administered 2019-09-14: 20 meq via ORAL
  Filled 2019-09-14: qty 1

## 2019-09-14 MED ORDER — LABETALOL HCL 100 MG PO TABS
200.0000 mg | ORAL_TABLET | Freq: Three times a day (TID) | ORAL | Status: DC
  Administered 2019-09-14 – 2019-09-25 (×32): 200 mg via ORAL
  Filled 2019-09-14 (×34): qty 2

## 2019-09-14 MED ORDER — HYDROXYZINE HCL 25 MG PO TABS
25.0000 mg | ORAL_TABLET | Freq: Once | ORAL | Status: AC
  Administered 2019-09-14: 17:00:00 25 mg via ORAL
  Filled 2019-09-14: qty 1

## 2019-09-14 MED ORDER — INSULIN ASPART 100 UNIT/ML ~~LOC~~ SOLN
0.0000 [IU] | Freq: Three times a day (TID) | SUBCUTANEOUS | Status: DC
  Administered 2019-09-16: 2 [IU] via SUBCUTANEOUS
  Administered 2019-09-17: 3 [IU] via SUBCUTANEOUS
  Administered 2019-09-17 – 2019-09-18 (×3): 2 [IU] via SUBCUTANEOUS
  Administered 2019-09-20: 3 [IU] via SUBCUTANEOUS
  Administered 2019-09-21 – 2019-09-22 (×2): 2 [IU] via SUBCUTANEOUS

## 2019-09-14 MED ORDER — POTASSIUM CHLORIDE 10 MEQ/100ML IV SOLN: 10.0000 meq | INTRAVENOUS | Status: DC

## 2019-09-14 MED ORDER — ONDANSETRON HCL 4 MG/2ML IJ SOLN: 4.0000 mg | Freq: Four times a day (QID) | INTRAMUSCULAR | Status: DC | PRN

## 2019-09-14 MED ORDER — SODIUM CHLORIDE 0.9% IV SOLUTION: Freq: Once | INTRAVENOUS | Status: AC

## 2019-09-14 MED ORDER — DIPHENHYDRAMINE-ZINC ACETATE 2-0.1 % EX CREA
TOPICAL_CREAM | Freq: Three times a day (TID) | CUTANEOUS | Status: DC | PRN
  Administered 2019-09-19: 1 via TOPICAL
  Filled 2019-09-14 (×2): qty 28

## 2019-09-14 MED ORDER — ONDANSETRON HCL 4 MG PO TABS: 4.0000 mg | ORAL_TABLET | Freq: Four times a day (QID) | ORAL | Status: DC | PRN

## 2019-09-14 NOTE — ED Notes (Signed)
[facility] dispatch notified for need of transport.  

## 2019-09-14 NOTE — ED Notes (Addendum)
Date and time results received: 09/14/19 5:00 PM  (use smartphrase ".now" to insert current time)  Test: Hgb Critical Value: 4.8  Name of Provider Notified: Marjean Donna, RN  Orders Received? Or Actions Taken?: Actions Taken: Chiropodist

## 2019-09-14 NOTE — ED Provider Notes (Signed)
[facility] DEPT Provider Note   CSN: [id number] Arrival date & time: 09/14/19  1600     History Chief Complaint  Patient presents with  . Hypertension  . Rash    Vanessa [name] is a 64 y.o. female.  She has a history of diabetes and hypertension.  She has been off her medicine for "a while" due to lack of insurance.  She is complaining of about a months worth of whole body itchiness which is causing her to scratch and create sores on her body.  She thinks it is related to detergent and soap.  She feels dizzy lightheaded which she attributes to her hypertension.  Intermittent blurry vision.  Intermittent shortness of breath.  She has recently acquired Medicare and has a PCP appointment next month.  No chest pain no abdominal pain no headache no numbness or weakness.  The history is provided by the patient.  Hypertension This is a chronic problem. The problem has not changed since onset.Associated symptoms include shortness of breath. Pertinent negatives include no chest pain, no abdominal pain and no headaches. Nothing aggravates the symptoms. Nothing relieves the symptoms. She has tried nothing for the symptoms. The treatment provided no relief.  Rash Location:  Torso and leg Quality: itchiness   Quality comment:  Multiple superficial small and medium sized ulcerations due to scratching trauma. Severity:  Moderate Onset quality:  Gradual Duration:  1 month Timing:  Constant Progression:  Unchanged Chronicity:  New Context: new detergent/soap   Relieved by:  Nothing Worsened by:  Nothing Ineffective treatments:  Antihistamines Associated symptoms: shortness of breath   Associated symptoms: no abdominal pain, no fever, no headaches, no nausea, no sore throat and not vomiting        Past Medical History:  Diagnosis Date  . Asthma   . Diabetes mellitus without complication ([place])   . Heart murmur   . Hypertension     Patient Active Problem  List   Diagnosis Date Noted  . Non compliance w medication regimen 03/24/2015  . Dyslipidemia 03/24/2015  . DM type 2 (diabetes mellitus, type 2) ([place]) 05/23/2014  . HTN (hypertension) 05/23/2014    Past Surgical History:  Procedure Laterality Date  . TYMPANOSTOMY TUBE PLACEMENT Bilateral      OB History    Gravida  4   Para  4   Term  3   Preterm  1   AB      Living        SAB      TAB      Ectopic      Multiple      Live Births              Family History  Problem Relation Age of Onset  . Stroke Mother   . Diabetes Mother   . Hypertension Mother   . Diabetes Brother   . Hypertension Brother     Social History   Tobacco Use  . Smoking status: Never Smoker  Substance Use Topics  . Alcohol use: No  . Drug use: No    Home Medications Prior to Admission medications   Medication Sig Start Date End Date Taking? Authorizing Provider  amLODipine (NORVASC) 10 MG tablet Take 1 tablet (10 mg total) by mouth daily. 11/29/16   Maren Reamer, MD  aspirin 81 MG tablet Take 1 tablet (81 mg total) by mouth daily. 11/29/16   Maren Reamer, MD  atorvastatin (LIPITOR) 40 MG  tablet Take 1 tablet (40 mg total) by mouth daily. 11/29/16   Maren Reamer, MD  Blood Glucose Monitoring Suppl (BAYER CONTOUR MONITOR) w/Device KIT Use as directed for 3 times daily testing of blood glucose. E11.9 12/01/16   Langeland, Leda Quail, MD  fluticasone (FLONASE) 50 MCG/ACT nasal spray Place 2 sprays into both nostrils daily. 11/29/16   Langeland, Dawn T, MD  glucose blood (BAYER CONTOUR TEST) test strip Use as instructed for 3 times daily testing of blood glucose. E11.9 12/01/16   Maren Reamer, MD  hydrochlorothiazide (HYDRODIURIL) 25 MG tablet Take 1 tablet (25 mg total) by mouth daily. 11/29/16   Maren Reamer, MD  insulin NPH-regular Human (NOVOLIN 70/30) (70-30) 100 UNIT/ML injection Take 50 units in the morning and 45 units at night. 11/29/16   Lottie Mussel T, MD    INSULIN SYRINGE 1CC/29G 29G X 1/2" 1 ML MISC Insulin syringes 03/06/14   Rolene Course, PA-C  Lancets [facility] [place] ULTRASOFT) lancets Use as instructed 11/29/16   Lottie Mussel T, MD  lisinopril (PRINIVIL,ZESTRIL) 40 MG tablet Take 1 tablet (40 mg total) by mouth daily. 11/29/16   Maren Reamer, MD  loratadine (CLARITIN) 10 MG tablet Take 1 tablet (10 mg total) by mouth daily. 11/29/16   Maren Reamer, MD  metFORMIN (GLUCOPHAGE XR) 500 MG 24 hr tablet Take 1 tablet (500 mg total) by mouth 2 (two) times daily. 11/29/16   Maren Reamer, MD    Allergies    Patient has no known allergies.  Review of Systems   Review of Systems  Constitutional: Negative for fever.  HENT: Negative for sore throat.   Eyes: Positive for visual disturbance.  Respiratory: Positive for shortness of breath.   Cardiovascular: Negative for chest pain.  Gastrointestinal: Negative for abdominal pain, nausea and vomiting.  Genitourinary: Negative for dysuria.  Musculoskeletal: Negative for neck pain.  Skin: Positive for rash and wound.  Neurological: Positive for dizziness. Negative for headaches.    Physical Exam Updated Vital Signs BP (!) 210/97 (BP Location: Left Arm)   Pulse 87   Temp 97.7 F (36.5 C) (Oral)   Resp 17   SpO2 98%   Physical Exam Vitals and nursing note reviewed.  Constitutional:      General: She is not in acute distress.    Appearance: She is well-developed.  HENT:     Head: Normocephalic and atraumatic.  Eyes:     Conjunctiva/sclera: Conjunctivae normal.  Cardiovascular:     Rate and Rhythm: Normal rate and regular rhythm.     Heart sounds: No murmur.  Pulmonary:     Effort: Pulmonary effort is normal. No respiratory distress.     Breath sounds: Normal breath sounds.  Abdominal:     Palpations: Abdomen is soft.     Tenderness: There is no abdominal tenderness.  Genitourinary:    Comments: Nurse Estill Bamberg as chaperone.  Normal external genitalia.  Speculum exam was  uncomfortable for patient does have some bleeding likely from os.  On bimanual does have some nodular tissue on the anterior vaginal wall.  Unable to ascertain if she has any uterine enlargement.  Nontender. Musculoskeletal:        General: No deformity or signs of injury. Normal range of motion.     Cervical back: Neck supple.  Skin:    General: Skin is warm and dry.     Capillary Refill: Capillary refill takes less than 2 seconds.     Comments: Multiple superficial  small and medium sized ulcerations in various stages of healing.  Neurological:     General: No focal deficit present.     Mental Status: She is alert.     ED Results / Procedures / Treatments   Labs (all labs ordered are listed, but only abnormal results are displayed) Labs Reviewed  COMPREHENSIVE METABOLIC PANEL - Abnormal; Notable for the following components:      Result Value   Potassium 3.1 (*)    Chloride 112 (*)    CO2 18 (*)    Glucose, Bld 113 (*)    BUN 27 (*)    Creatinine, Ser 1.90 (*)    Calcium 8.2 (*)    Albumin 3.0 (*)    GFR calc non Af Amer 27 (*)    GFR calc Af Amer 32 (*)    All other components within normal limits  CBC WITH DIFFERENTIAL/PLATELET - Abnormal; Notable for the following components:   RBC 2.11 (*)    Hemoglobin 4.8 (*)    HCT 17.6 (*)    MCH 22.7 (*)    MCHC 27.3 (*)    RDW 24.1 (*)    Platelets 115 (*)    nRBC 2.1 (*)    All other components within normal limits  VITAMIN B12 - Abnormal; Notable for the following components:   Vitamin B-12 1,247 (*)    All other components within normal limits  FERRITIN - Abnormal; Notable for the following components:   Ferritin 10 (*)    All other components within normal limits  RETICULOCYTES - Abnormal; Notable for the following components:   RBC. 2.04 (*)    Immature Retic Fract 37.3 (*)    All other components within normal limits  HEMOGLOBIN AND HEMATOCRIT, BLOOD - Abnormal; Notable for the following components:   Hemoglobin  4.6 (*)    HCT 16.9 (*)    All other components within normal limits  CBC - Abnormal; Notable for the following components:   RBC 2.68 (*)    Hemoglobin 6.8 (*)    HCT 22.1 (*)    MCH 25.4 (*)    RDW 19.9 (*)    Platelets 66 (*)    nRBC 2.3 (*)    All other components within normal limits  URIC ACID - Abnormal; Notable for the following components:   Uric Acid, Serum 11.9 (*)    All other components within normal limits  BASIC METABOLIC PANEL - Abnormal; Notable for the following components:   Potassium 2.7 (*)    CO2 19 (*)    Creatinine, Ser 1.78 (*)    Calcium 8.3 (*)    GFR calc non Af Amer 29 (*)    GFR calc Af Amer 34 (*)    All other components within normal limits  HEMOGLOBIN A1C - Abnormal; Notable for the following components:   Hgb A1c MFr Bld 5.7 (*)    All other components within normal limits  POC OCCULT BLOOD, ED - Abnormal; Notable for the following components:   Fecal Occult Bld POSITIVE (*)    All other components within normal limits  TROPONIN I (HIGH SENSITIVITY) - Abnormal; Notable for the following components:   Troponin I (High Sensitivity) 49 (*)    All other components within normal limits  TROPONIN I (HIGH SENSITIVITY) - Abnormal; Notable for the following components:   Troponin I (High Sensitivity) 50 (*)    All other components within normal limits  SARS CORONAVIRUS 2 (TAT 6-24 HRS)  PROTIME-INR  FOLATE  IRON AND TIBC  MAGNESIUM  PHOSPHORUS  GLUCOSE, CAPILLARY  TSH  TECHNOLOGIST SMEAR REVIEW  GLUCOSE, CAPILLARY  HIV ANTIBODY (ROUTINE TESTING W REFLEX)  FOLATE RBC  TYPE AND SCREEN  PREPARE RBC (CROSSMATCH)  ABO/RH  TYPE AND SCREEN  ABO/RH  PREPARE RBC (CROSSMATCH)    EKG EKG Interpretation  Date/Time:  Saturday September 14 2019 16:22:15 EST Ventricular Rate:  83 PR Interval:    QRS Duration: 100 QT Interval:  435 QTC Calculation: 512 R Axis:   24 Text Interpretation: Sinus rhythm Borderline T wave abnormalities Prolonged QT  interval No significant change since prior11/15 Confirmed by Aletta Edouard [phone]) on 09/14/2019 4:33:29 PM   Radiology DG Chest Port 1 View  Result Date: 09/14/2019 CLINICAL DATA:  Shortness of breath. EXAM: PORTABLE CHEST 1 VIEW COMPARISON:  03/06/2014 FINDINGS: Enlarged cardiac silhouette with an interval significant increase in size. Clear lungs. Interval prominence of the pulmonary vasculature in the upper lung zones. No pleural fluid. Thoracic spine degenerative changes. Interval probable old, healed left humeral neck fracture. Mild right shoulder degenerative changes. IMPRESSION: Interval cardiomegaly and pulmonary vascular congestion in the upper lung zones. Electronically Signed   By: Claudie Revering M.D.   On: 09/14/2019 16:58    Procedures .Critical Care Performed by: Hayden Rasmussen, MD Authorized by: Hayden Rasmussen, MD   Critical care provider statement:    Critical care time (minutes):  45   Critical care time was exclusive of:  Separately billable procedures and treating other patients   Critical care was necessary to treat or prevent imminent or life-threatening deterioration of the following conditions:  Circulatory failure   Critical care was time spent personally by me on the following activities:  Discussions with consultants, evaluation of patient's response to treatment, examination of patient, ordering and performing treatments and interventions, ordering and review of laboratory studies, ordering and review of radiographic studies, pulse oximetry, re-evaluation of patient's condition, obtaining history from patient or surrogate, review of old charts and development of treatment plan with patient or surrogate   I assumed direction of critical care for this patient from another provider in my specialty: no     (including critical care time)  Medications Ordered in ED Medications  0.9 %  sodium chloride infusion (has no administration in time range)  hydrOXYzine  (ATARAX/VISTARIL) tablet 25 mg (25 mg Oral Given 09/14/19 1645)  amLODipine (NORVASC) tablet 10 mg (10 mg Oral Given 09/14/19 1646)  hydrochlorothiazide (MICROZIDE) capsule 25 mg (25 mg Oral Given 09/14/19 1645)  lisinopril (ZESTRIL) tablet 40 mg (40 mg Oral Given 09/14/19 1645)  potassium chloride SA (KLOR-CON) CR tablet 20 mEq (20 mEq Oral Given 09/14/19 1830)    ED Course  I have reviewed the triage vital signs and the nursing notes.  Pertinent labs & imaging results that were available during my care of the patient were reviewed by me and considered in my medical decision making (see chart for details).  Clinical Course as of Sep 13 1929  Sat Sep 14, 2019  1656 Patient here with generalized itching has been going on for over a month, multiple skin lesions likely due to scratching trauma, dizziness and vague shortness of breath and elevated blood pressure.  Off blood pressure medicines for greater than a year.  Hypertensive here.  Otherwise nontoxic-appearing.  Checking liver enzymes to make sure there is not any evidence of liver failure causing her pruritus.  EKG not showing any significant findings.  Have ordered her her  home blood pressure medicines that she was on prior.  Checking some screening labs to evaluate for renal failure and metabolic abnormalities.   [MB]  9340 Chest x-ray interpreted by me as cardiomegaly no pneumothorax no gross infiltrates.   [MB]  1711 Received call from lab the patient's hemoglobin was 4.8.  Last hemoglobin was 11 in 2017.  We will repeat hemoglobin and adding on extra labs.  Will get Hemoccult.   [MB]  6840 CT interpreted by me as no gross abnormalities to explain her hematuria.  Awaiting radiology reading.   [MB]  [phone] Chemistry coming back with a new AKI creatinine 1.9.  Bicarb down potassium down.   [MB]  1747 Hemoglobin(!!): 4.8 [MB]  1747 Platelets(!): 115 [MB]  3174 When further asked says she is having vaginal bleeding for the last 6 months  intermittently.  She said it is not heavy and she uses a pad.  Has not had any gynecologic care for a few years.  Denies any rectal bleeding or throwing up of blood.   [MB]  1821 Pelvic exam done does show some signs of bleeding actively although slow.  Discussed with Dr. Kennon Rounds OB/GYN on-call.  She said they would consult on the patient and she will likely need an ultrasound and endometrial biopsy although this may not happen while she is hospitalized.   [MB]  0992 Discussed with Dr. Jonnie Finner [facility] hospitalist who will evaluate the patient for admission.   [MB]    Clinical Course User Index [MB] Hayden Rasmussen, MD   MDM Rules/Calculators/A&P                      Final Clinical Impression(s) / ED Diagnoses Final diagnoses:  Postmenopausal vaginal bleeding  Hypertensive urgency  Symptomatic anemia  Pruritic disorder  AKI (acute kidney injury) ([place])  Hypokalemia    Rx / DC Orders ED Discharge Orders    None       Hayden Rasmussen, MD 09/15/19 1014

## 2019-09-14 NOTE — H&P (Addendum)
History and Physical    Arisbeth Purrington JQB:[id number] DOB: 11/22/1955 DOA: 09/14/2019  PCP: Maren Reamer, MD (Inactive) Patient coming from: Home  I have personally briefly reviewed patient's old medical records in [place]  Chief Complaint: Vaginal bleeding, itching  HPI: Litzi Binning is a 64 y.o. female with medical history significant for HTN, DM2 and asthma who presents to the ED after not seeing a physician for multiple years after losing insurance when she moved from [place] to [place]. She has not been taking any medication during that time. She reports vaginal bleeding for the last few months after initially undergoing menopause many years ago. She then developed severe itching and progressive shortness of breath over the last 3 weeks. She denies fever, chills, weight loss, night sweats, chest pain, palpitations, abdominal pain, nausea, vomiting or diarrhea. Denies headache or vision changes. She does endorse chronic swelling in her bilateral lower extremities. She endorses a prior history of anemia, but is unsure of her baseline hemoglobin. She denies any new medications or OTC supplements.   ED Course: In the ED, she was afebrile and hypertensive to 220/95 mmHg. Labs notable for Hgb 4.8, plt 115, K 3.1, CO2 18, BUN 27, Cr 1.90, normal LFTs. Iron 115, Ferritin 10, % iron sat and TIB wnl, B12 wnl, folate wnl. CXR showed cardiomegaly and mildly prominent vascular marking in the upper lobes consistent with vascular congestion. She received amlodipine, HCTZ and lisinopril in the ED, with improvement in blood pressure. GYN was consulted who recommended admission to [facility] and vaginal ultrasound.  Review of Systems: As per HPI otherwise 10 point review of systems negative.   Past Medical History:  Diagnosis Date  . Asthma   . Diabetes mellitus without complication ([place])   . Heart murmur   . Hypertension     Past Surgical History:  Procedure Laterality Date  .  TYMPANOSTOMY TUBE PLACEMENT Bilateral      reports that she has never smoked. She does not have any smokeless tobacco history on file. She reports that she does not drink alcohol or use drugs.  No Known Allergies  Family History  Problem Relation Age of Onset  . Stroke Mother   . Diabetes Mother   . Hypertension Mother   . Diabetes Brother   . Hypertension Brother     Prior to Admission medications   Medication Sig Start Date End Date Taking? Authorizing Provider  acetaminophen (TYLENOL) 500 MG tablet Take 500 mg by mouth every 6 (six) hours as needed for moderate pain.   Yes [provider]  aspirin EC 325 MG tablet Take 325 mg by mouth every 6 (six) hours as needed for moderate pain.   Yes [provider]  aspirin 81 MG tablet Take 1 tablet (81 mg total) by mouth daily. Patient not taking: Reported on 09/14/2019 11/29/16   Lottie Mussel T, MD  Blood Glucose Monitoring Suppl (BAYER CONTOUR MONITOR) w/Device KIT Use as directed for 3 times daily testing of blood glucose. E11.9 12/01/16   Langeland, Leda Quail, MD  fluticasone (FLONASE) 50 MCG/ACT nasal spray Place 2 sprays into both nostrils daily. Patient not taking: Reported on 09/14/2019 11/29/16   Lottie Mussel T, MD  glucose blood (BAYER CONTOUR TEST) test strip Use as instructed for 3 times daily testing of blood glucose. E11.9 12/01/16   Maren Reamer, MD  hydrochlorothiazide (HYDRODIURIL) 25 MG tablet Take 1 tablet (25 mg total) by mouth daily. Patient not taking: Reported  on 09/14/2019 11/29/16   Maren Reamer, MD  insulin NPH-regular Human (NOVOLIN 70/30) (70-30) 100 UNIT/ML injection Take 50 units in the morning and 45 units at night. Patient not taking: Reported on 09/14/2019 11/29/16   Lottie Mussel T, MD  INSULIN SYRINGE 1CC/29G 29G X 1/2" 1 ML MISC Insulin syringes 03/06/14   Rolene Course, PA-C  Lancets [facility] ULTRASOFT) lancets Use as instructed 11/29/16   Lottie Mussel T, MD  lisinopril  (PRINIVIL,ZESTRIL) 40 MG tablet Take 1 tablet (40 mg total) by mouth daily. Patient not taking: Reported on 09/14/2019 11/29/16   Maren Reamer, MD  loratadine (CLARITIN) 10 MG tablet Take 1 tablet (10 mg total) by mouth daily. Patient not taking: Reported on 09/14/2019 11/29/16   Lottie Mussel T, MD  metFORMIN (GLUCOPHAGE XR) 500 MG 24 hr tablet Take 1 tablet (500 mg total) by mouth 2 (two) times daily. Patient not taking: Reported on 09/14/2019 11/29/16   Maren Reamer, MD    Physical Exam: Vitals:   09/14/19 1958 09/14/19 2013 09/14/19 2014 09/14/19 2059  BP:  (!) 179/79 (!) 179/79 (!) 185/81  Pulse:  79 79 85  Resp:  19 16 18   Temp: 97.7 F (36.5 C)  97.7 F (36.5 C) 98.1 F (36.7 C)  TempSrc: Oral  Oral Oral  SpO2:  100%  100%    Constitutional: NAD, calm, comfortable Eyes: conjunctival rim pallor, no icterus ENMT: Mucous membranes are moist. Posterior pharynx clear of any exudate or lesions Neck: normal, supple, no masses Respiratory: clear to auscultation bilaterally, no wheezing, no crackles. Normal respiratory effor Cardiovascular: Regular rate and rhythm, no murmurs / rubs / gallops. 2+ pitting edema of BLE. 2+ pedal pulses. Abdomen: no tenderness, no masses palpated. Bowel sounds positive.  Musculoskeletal: no clubbing / cyanosis. No joint deformity upper and lower extremities. Skin: excoriations on BLE and trunk Neurologic: CN 2-12 grossly intact. Sensation intact, DTR normal. Strength 5/5 in all 4.  Psychiatric: Normal judgment and insight. Alert and oriented x 3. Normal mood.    Labs on Admission: I have personally reviewed following labs and imaging studies  CBC: Recent Labs  Lab 09/14/19 1620 09/14/19 1725  WBC 6.8  --   NEUTROABS 5.4  --   HGB 4.8* 4.6*  HCT 17.6* 16.9*  MCV 83.4  --   PLT 115*  --    Basic Metabolic Panel: Recent Labs  Lab 09/14/19 1620  NA 140  K 3.1*  CL 112*  CO2 18*  GLUCOSE 113*  BUN 27*  CREATININE 1.90*  CALCIUM  8.2*   GFR: CrCl cannot be calculated (Unknown ideal weight.). Liver Function Tests: Recent Labs  Lab 09/14/19 1620  AST 31  ALT 19  ALKPHOS 80  BILITOT 0.6  PROT 7.4  ALBUMIN 3.0*   No results for input(s): LIPASE, AMYLASE in the last 168 hours. No results for input(s): AMMONIA in the last 168 hours. Coagulation Profile: Recent Labs  Lab 09/14/19 1725  INR 1.1   Cardiac Enzymes: No results for input(s): CKTOTAL, CKMB, CKMBINDEX, TROPONINI in the last 168 hours. BNP (last 3 results) No results for input(s): PROBNP in the last 8760 hours. HbA1C: No results for input(s): HGBA1C in the last 72 hours. CBG: Recent Labs  Lab 09/14/19 2128  GLUCAP 79   Lipid Profile: No results for input(s): CHOL, HDL, LDLCALC, TRIG, CHOLHDL, LDLDIRECT in the last 72 hours. Thyroid Function Tests: No results for input(s): TSH, T4TOTAL, FREET4, T3FREE, THYROIDAB in the last 72 hours.  Anemia Panel: Recent Labs    09/14/19 1725  VITAMINB12 1,247*  FOLATE 6.9  FERRITIN 10*  TIBC 374  IRON 115  RETICCTPCT 1.7   Urine analysis:    Component Value Date/Time   COLORURINE YELLOW 03/06/2014 1644   APPEARANCEUR CLOUDY (A) 03/06/2014 1644   LABSPEC 1.030 03/06/2014 1644   PHURINE 6.5 03/06/2014 1644   GLUCOSEU >1000 (A) 03/06/2014 1644   HGBUR LARGE (A) 03/06/2014 1644   BILIRUBINUR NEGATIVE 03/06/2014 1644   KETONESUR NEGATIVE 03/06/2014 1644   PROTEINUR 100 (A) 03/06/2014 1644   UROBILINOGEN 0.2 03/06/2014 1644   NITRITE NEGATIVE 03/06/2014 1644   LEUKOCYTESUR NEGATIVE 03/06/2014 1644    Radiological Exams on Admission: DG Chest Port 1 View  Result Date: 09/14/2019 CLINICAL DATA:  Shortness of breath. EXAM: PORTABLE CHEST 1 VIEW COMPARISON:  03/06/2014 FINDINGS: Enlarged cardiac silhouette with an interval significant increase in size. Clear lungs. Interval prominence of the pulmonary vasculature in the upper lung zones. No pleural fluid. Thoracic spine degenerative changes.  Interval probable old, healed left humeral neck fracture. Mild right shoulder degenerative changes. IMPRESSION: Interval cardiomegaly and pulmonary vascular congestion in the upper lung zones. Electronically Signed   By: Claudie Revering M.D.   On: 09/14/2019 16:58    EKG: Independently reviewed. Sinus rhythm, rate 83. No ST-T abnormalities.  Assessment/Plan Active Problems:   Symptomatic anemia  Ms. Podoll presents with symptomatic anemia in the setting of abnormal vaginal bleeding. Although FOBT was positive, she denies melena or hematochezia and blood on smear likely due to vaginal bleeding. Blood loss not acute given patient's hemodynamic compensation. Shortness of breath likely in setting of anemia as well. No masses palpated on abdominal exam. Possible AKI versus CKD also present with uncertain etiology, but perhaps related to long-standing hypertension versus prerenal etiology. Etiology of severe itching is not immediately clear, but does raise concern for occult malignancy as her liver function is normal and BUN is not elevated to the level at which pruritis would be expected.  Symptomatic anemia Abnormal vaginal bleeding in post-menopausal woman - NPO for now - Transfuse 2 U PRBC then follow up CBC - Trend H&H - Check coags, maintain active T&S - Obtain vaginal U/S, CT a/p with oral contrast - Anemia workup  Hypertensive urgency - Continue amlodipine, hold on further thiazide or ACE given renal dysfunction - Add labetalol - Goal SBP <180, DBP <100  Possible AKI Non-anion gap metabolic acidosis - Trend creatinine, lytes - Strict I/O - Avoid nephrotoxins - Urine studies if renal function not improving - Would avoid hydration given lower extremity edema and suggestion of vascular congestion on CXR - Low threshold for diuresis if signs of volume overload given transfusion needs  Hyperkalemia - Replete lytes - Trend BMP, renal function as above  Pruritis - Work up for occult  malignancy as above - Benadryl cream for itching  DM2 - Check A1c - Start SSI  DVT prophylaxis: SCD Code Status: Full Disposition Plan: Home in 2-3 days Consults called: GYN Admission status: Inpatient   Bennie Pierini MD [facility]  If 7PM-7AM, please contact night-coverage [email or web address] Password [facility]  09/14/2019, 9:38 PM

## 2019-09-14 NOTE — ED Triage Notes (Addendum)
Pt to ED with c/o of allergic reaction to laundry detergent that has been taking "allergy medicine" and lotion at home with little relief and now has Open Sores. Pt also HTN 240/140, with dizzy spells at home. Pt is supposed to be on medication but has not taken in over a year due to no insurance. Pt recently aquired Medicare and has a PCP apt in April but has not seen anyone yet.

## 2019-09-15 ENCOUNTER — Encounter (HOSPITAL_COMMUNITY): Payer: Self-pay | Admitting: Internal Medicine

## 2019-09-15 ENCOUNTER — Inpatient Hospital Stay (HOSPITAL_COMMUNITY): Payer: Medicare Other

## 2019-09-15 DIAGNOSIS — I129 Hypertensive chronic kidney disease with stage 1 through stage 4 chronic kidney disease, or unspecified chronic kidney disease: Secondary | ICD-10-CM

## 2019-09-15 DIAGNOSIS — D696 Thrombocytopenia, unspecified: Secondary | ICD-10-CM

## 2019-09-15 DIAGNOSIS — N950 Postmenopausal bleeding: Secondary | ICD-10-CM | POA: Diagnosis present

## 2019-09-15 DIAGNOSIS — E1122 Type 2 diabetes mellitus with diabetic chronic kidney disease: Secondary | ICD-10-CM

## 2019-09-15 DIAGNOSIS — E790 Hyperuricemia without signs of inflammatory arthritis and tophaceous disease: Secondary | ICD-10-CM

## 2019-09-15 DIAGNOSIS — E876 Hypokalemia: Secondary | ICD-10-CM

## 2019-09-15 DIAGNOSIS — L299 Pruritus, unspecified: Secondary | ICD-10-CM | POA: Insufficient documentation

## 2019-09-15 LAB — CBC
HCT: 22.1 % — ABNORMAL LOW (ref 36.0–46.0)
Hemoglobin: 6.8 g/dL — CL (ref 12.0–15.0)
MCH: 25.4 pg — ABNORMAL LOW (ref 26.0–34.0)
MCHC: 30.8 g/dL (ref 30.0–36.0)
MCV: 82.5 fL (ref 80.0–100.0)
Platelets: 66 10*3/uL — ABNORMAL LOW (ref 150–400)
RBC: 2.68 MIL/uL — ABNORMAL LOW (ref 3.87–5.11)
RDW: 19.9 % — ABNORMAL HIGH (ref 11.5–15.5)
WBC: 6.7 10*3/uL (ref 4.0–10.5)
nRBC: 2.3 % — ABNORMAL HIGH (ref 0.0–0.2)

## 2019-09-15 LAB — GLUCOSE, CAPILLARY
Glucose-Capillary: 78 mg/dL (ref 70–99)
Glucose-Capillary: 75 mg/dL (ref 70–99)
Glucose-Capillary: 99 mg/dL (ref 70–99)
Glucose-Capillary: 106 mg/dL — ABNORMAL HIGH (ref 70–99)

## 2019-09-15 LAB — BASIC METABOLIC PANEL
Anion gap: 12 (ref 5–15)
BUN: 23 mg/dL (ref 8–23)
CO2: 19 mmol/L — ABNORMAL LOW (ref 22–32)
Calcium: 8.3 mg/dL — ABNORMAL LOW (ref 8.9–10.3)
Chloride: 109 mmol/L (ref 98–111)
Creatinine, Ser: 1.78 mg/dL — ABNORMAL HIGH (ref 0.44–1.00)
GFR calc Af Amer: 34 mL/min — ABNORMAL LOW (ref >60)
GFR calc non Af Amer: 29 mL/min — ABNORMAL LOW (ref >60)
Glucose, Bld: 92 mg/dL (ref 70–99)
Potassium: 2.7 mmol/L — CL (ref 3.5–5.1)
Sodium: 140 mmol/L (ref 135–145)

## 2019-09-15 LAB — HEMOGLOBIN A1C
Hgb A1c MFr Bld: 5.7 % — ABNORMAL HIGH (ref 4.8–5.6)
Mean Plasma Glucose: 116.89 mg/dL

## 2019-09-15 LAB — HIV ANTIBODY (ROUTINE TESTING W REFLEX): HIV Screen 4th Generation wRfx: NONREACTIVE

## 2019-09-15 LAB — PREPARE RBC (CROSSMATCH)

## 2019-09-15 LAB — URIC ACID: Uric Acid, Serum: 11.9 mg/dL — ABNORMAL HIGH (ref 2.5–7.1)

## 2019-09-15 LAB — TECHNOLOGIST SMEAR REVIEW

## 2019-09-15 LAB — MAGNESIUM: Magnesium: 2.1 mg/dL (ref 1.7–2.4)

## 2019-09-15 LAB — TSH: TSH: 3.639 u[IU]/mL (ref 0.350–4.500)

## 2019-09-15 LAB — SARS CORONAVIRUS 2 (TAT 6-24 HRS): SARS Coronavirus 2: NEGATIVE

## 2019-09-15 LAB — ABO/RH
ABO/RH(D): O POS
ABO/RH(D): O POS

## 2019-09-15 LAB — PHOSPHORUS: Phosphorus: 3.9 mg/dL (ref 2.5–4.6)

## 2019-09-15 MED ORDER — POTASSIUM CHLORIDE CRYS ER 20 MEQ PO TBCR
40.0000 meq | EXTENDED_RELEASE_TABLET | Freq: Two times a day (BID) | ORAL | Status: DC
  Administered 2019-09-15: 40 meq via ORAL
  Filled 2019-09-15: qty 2

## 2019-09-15 MED ORDER — FERROUS SULFATE 325 (65 FE) MG PO TABS
325.0000 mg | ORAL_TABLET | Freq: Two times a day (BID) | ORAL | Status: DC
  Administered 2019-09-15 – 2019-09-25 (×19): 325 mg via ORAL
  Filled 2019-09-15 (×18): qty 1

## 2019-09-15 MED ORDER — INFLUENZA VAC A&B SA ADJ QUAD 0.5 ML IM PRSY
0.5000 mL | PREFILLED_SYRINGE | INTRAMUSCULAR | Status: DC
  Filled 2019-09-15: qty 0.5

## 2019-09-15 MED ORDER — HYDROXYZINE HCL 25 MG PO TABS
25.0000 mg | ORAL_TABLET | Freq: Four times a day (QID) | ORAL | Status: DC | PRN
  Administered 2019-09-15 – 2019-09-19 (×4): 25 mg via ORAL
  Filled 2019-09-15 (×5): qty 1

## 2019-09-15 MED ORDER — SODIUM CHLORIDE 0.9 % IV SOLN
510.0000 mg | Freq: Once | INTRAVENOUS | Status: AC
  Administered 2019-09-15: 510 mg via INTRAVENOUS
  Filled 2019-09-15 (×2): qty 17

## 2019-09-15 MED ORDER — POTASSIUM CHLORIDE CRYS ER 20 MEQ PO TBCR
40.0000 meq | EXTENDED_RELEASE_TABLET | Freq: Three times a day (TID) | ORAL | Status: DC
  Administered 2019-09-15 – 2019-09-16 (×3): 40 meq via ORAL
  Filled 2019-09-15 (×3): qty 2

## 2019-09-15 MED ORDER — MEGESTROL ACETATE 40 MG PO TABS
40.0000 mg | ORAL_TABLET | Freq: Two times a day (BID) | ORAL | Status: DC
  Administered 2019-09-15 – 2019-09-16 (×3): 40 mg via ORAL
  Filled 2019-09-15 (×3): qty 1

## 2019-09-15 MED ORDER — PNEUMOCOCCAL VAC POLYVALENT 25 MCG/0.5ML IJ INJ
0.5000 mL | INJECTION | INTRAMUSCULAR | Status: DC
  Filled 2019-09-15 (×3): qty 0.5

## 2019-09-15 NOTE — Progress Notes (Signed)
Patient transferred from [facility] ED to [facility]. Paged [facility] admission to determine receiving MD. Dr Jonnie Finner  with Hospitalist at [facility] was notified that patient is at [facility] now and ordered to prepare and transfuse 1 more unit of RBC.

## 2019-09-15 NOTE — Discharge Instructions (Signed)
Postmenopausal Bleeding  Postmenopausal bleeding is any bleeding that occurs after menopause. Menopause is when a woman's period stops. Any type of bleeding after menopause should be checked by your doctor. Treatment will depend on the cause. Follow these instructions at home:  Pay attention to any changes in your symptoms.  Avoid using tampons and douches as told by your doctor.  Change your pads regularly.  Get regular pelvic exams and Pap tests.  Take iron pills as told by your doctor.  Take over-the-counter and prescription medicines only as told by your doctor.  Keep all follow-up visits as told by your doctor. This is important. Contact a doctor if:  Your bleeding lasts for more than 1 week.  You have pain in your belly (abdomen).  You have bleeding during or after sex.  You have bleeding that happens more often than every 3 weeks. Get help right away if:  You have fever, chills, headache, dizziness, muscle aches, or bleeding.  You have very bad pain with bleeding.  You have clumps of blood (blood clots) coming from your vagina.  You have a lot of bleeding, and: ? You use more than 1 pad an hour. ? This kind of bleeding has never happened before.  You feel like you are going to pass out (faint). Summary  Any type of bleeding after menopause should be checked by your doctor.  Pay attention to any changes in your symptoms.  Keep all follow-up visits as told by your doctor. This information is not intended to replace advice given to you by your health care provider. Make sure you discuss any questions you have with your health care provider. Document Revised: 09/13/2018 Document Reviewed: 08/02/2016 Elsevier Patient Education  2020 Elsevier Inc.  

## 2019-09-15 NOTE — Plan of Care (Signed)
  Problem: Education: Goal: Knowledge of General Education information will improve Description Including pain rating scale, medication(s)/side effects and non-pharmacologic comfort measures Outcome: Progressing   

## 2019-09-15 NOTE — Consult Note (Signed)
[facility]  Impression: Principal Problem:   Symptomatic anemia Active Problems:   DM type 2 (diabetes mellitus, type 2) (HCC)   HTN (hypertension)   Postmenopausal bleeding   Thrombocytopenia (HCC)   Generalized pruritus   Acute on chronic renal insufficiency   Hyperuricemia   Hypokalemia  Recommendations: Megace 40 mg bid to stop bleeding. May increase to 80 mg BID if not helping. Hgb with appropriate rise after blood transfusion. 4.8-->6.8 TVUS to assess endometrial lining. Will need endometrial sampling, which can be done in the outpatient setting, +/- pap smear. I have sent a message to our staff to get this scheduled in about 2 weeks. Discussed possibility of endometrial cancer and ruling that out as top priority. Very difficult to do on the floor. If +ve, would need GYN/ONC services. Unsure about plt count. Usually PMB does not affect plt counts. 115-->66 today Unclear cause of generalized itching. BP is not optimally controlled Worsening kidney function Good glycemic control as A1C is 5.7. Replete potassium per primary team  Reason for consult: Patient is a 64 y.o. [id number] female who was admitted with symptomatic anemia. She has a long h/o DM and HTN and elevated Cholesterol. 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 Additionally, did not have health insurance for a time, which was also a barrier to care.  We are asked to see the patient regarding postmenopausal bleeding. She is not a great historian, and she reports menopause "a long time ago". She reports "several months" of new bleeding (though she cannot tell me how long this has been going on), which can be heavy at times. according to nursing staff they have changed 2 pads since arrival. She has no idea when her last pap smear was and has not seen a gynecologist in some time. She came in through the ED  with worsening shortness of breath and was found to be quite anemic with Hgb of 4.6. Additionally, noted to have some itching which she describes as new in the past few weeks and diffuse. She reports itching everywhere and changing soaps and lotions and detergents which did not help. Found in the ED to have some renal insufficiency and low K+.   Past Medical History:  Diagnosis Date  . Asthma   . Diabetes mellitus without complication ([place])   . Heart murmur   . Hypertension     Past Surgical History:  Procedure Laterality Date  . TYMPANOSTOMY TUBE PLACEMENT Bilateral     Family History  Problem Relation Age of Onset  . Stroke Mother   . Diabetes Mother   . Hypertension Mother   . Diabetes Brother   . Hypertension Brother     Social History   Socioeconomic History  . Marital status: Married    Spouse name: Not on file  . Number of children: Not on file  . Years of education: Not on file  . Highest education level: Not on file  Occupational History  . Not on file  Tobacco Use  . Smoking status: Never Smoker  . Smokeless tobacco: Never Used  Substance and Sexual Activity  . Alcohol use: No  . Drug use: No  . Sexual activity: Never  Other Topics Concern  . Not on file  Social History Narrative  . Not on file   Social Determinants of Health   Financial Resource Strain:   .  Difficulty of Paying Living Expenses: Not on file  Food Insecurity:   . Worried About Charity fundraiser in the Last Year: Not on file  . Ran Out of Food in the Last Year: Not on file  Transportation Needs:   . Lack of Transportation (Medical): Not on file  . Lack of Transportation (Non-Medical): Not on file  Physical Activity:   . Days of Exercise per Week: Not on file  . Minutes of Exercise per Session: Not on file  Stress:   . Feeling of Stress : Not on file  Social Connections:   . Frequency of Communication with Friends and Family: Not on file  . Frequency of Social Gatherings with  Friends and Family: Not on file  . Attends Religious Services: Not on file  . Active Member of Clubs or Organizations: Not on file  . Attends Archivist Meetings: Not on file  . Marital Status: Not on file  Intimate Partner Violence:   . Fear of Current or Ex-Partner: Not on file  . Emotionally Abused: Not on file  . Physically Abused: Not on file  . Sexually Abused: Not on file    . amLODipine  10 mg Oral Daily  . atorvastatin  40 mg Oral Daily  . [START ON 09/16/2019] influenza vaccine adjuvanted  0.5 mL Intramuscular Tomorrow-1000  . insulin aspart  0-15 Units Subcutaneous TID WC  . insulin aspart  0-5 Units Subcutaneous QHS  . labetalol  200 mg Oral TID  . megestrol  40 mg Oral BID  . [START ON 09/16/2019] pneumococcal 23 valent vaccine  0.5 mL Intramuscular Tomorrow-1000  . potassium chloride  40 mEq Oral BID    No Known Allergies  Review of Systems - Negative except ihcing and Shortness of breath  Exam Vitals:   09/15/19 0406 09/15/19 1032  BP: (!) 145/69   Pulse: (!) 59 72  Resp: 16   Temp: (!) 97.4 F (36.3 C)   SpO2: 100%     Physical Examination: General appearance - alert, well appearing, and in no distress Neck - supple, no significant adenopathy Chest - normal effort Heart - normal rate and regular rhythm Abdomen - soft, nontender, nondistended, no masses or organomegaly Neurological - alert, oriented, normal speech, no focal findings or movement disorder noted Musculoskeletal - no joint tenderness, deformity or swelling Extremities - Homan's sign negative bilaterally Skin - multiple excoriations on trunk and BLE  Labs:  CBC    Component Value Date/Time   WBC 6.7 09/15/2019 0616   RBC 2.68 (L) 09/15/2019 0616   HGB 6.8 (LL) 09/15/2019 0616   HCT 22.1 (L) 09/15/2019 0616   PLT 66 (L) 09/15/2019 0616   MCV 82.5 09/15/2019 0616   MCH 25.4 (L) 09/15/2019 0616   MCHC 30.8 09/15/2019 0616   RDW 19.9 (H) 09/15/2019 0616   LYMPHSABS 1.0  09/14/2019 1620   MONOABS 0.3 09/14/2019 1620   EOSABS 0.0 09/14/2019 1620   BASOSABS 0.0 09/14/2019 1620    CMP     Component Value Date/Time   NA 140 09/15/2019 0616   K 2.7 (LL) 09/15/2019 0616   CL 109 09/15/2019 0616   CO2 19 (L) 09/15/2019 0616   GLUCOSE 92 09/15/2019 0616   BUN 23 09/15/2019 0616   CREATININE 1.78 (H) 09/15/2019 0616   CREATININE 1.21 (H) 08/15/2016 1656   CALCIUM 8.3 (L) 09/15/2019 0616   PROT 7.4 09/14/2019 1620   ALBUMIN 3.0 (L) 09/14/2019 1620   AST 31  09/14/2019 1620   ALT 19 09/14/2019 1620   ALKPHOS 80 09/14/2019 1620   BILITOT 0.6 09/14/2019 1620   GFRNONAA 29 (L) 09/15/2019 0616   GFRNONAA 48 (L) 08/15/2016 1656   GFRAA 34 (L) 09/15/2019 0616   GFRAA 56 (L) 08/15/2016 1656    Lab Results  Component Value Date   TSH 3.639 09/15/2019     Radiological Studies CT ABDOMEN PELVIS WO CONTRAST  Result Date: 09/15/2019 CLINICAL DATA:  Nausea and vomiting.  Concern for pelvic malignancy. EXAM: CT ABDOMEN AND PELVIS WITHOUT CONTRAST TECHNIQUE: Multidetector CT imaging of the abdomen and pelvis was performed following the standard protocol without IV contrast. COMPARISON:  September 14, 2019 FINDINGS: Lower chest: There is a small right-sided pleural effusion.The heart size is mildly enlarged. The intracardiac blood pool is hypodense relative to the adjacent myocardium consistent with anemia. Hepatobiliary: The liver is normal. Normal gallbladder.There is no biliary ductal dilation. Pancreas: Normal contours without ductal dilatation. No peripancreatic fluid collection. Spleen: No splenic laceration or hematoma. Adrenals/Urinary Tract: --Adrenal glands: No adrenal hemorrhage. --Right kidney/ureter: No hydronephrosis or perinephric hematoma. --Left kidney/ureter: The left kidney is atrophic. This may be secondary to a distal left ureteral stone measuring approximately 7 mm (axial series 3, image 58), however the ureter at this level is difficult to follow.  --Urinary bladder: Unremarkable. Stomach/Bowel: --Stomach/Duodenum: No hiatal hernia or other gastric abnormality. Normal duodenal course and caliber. --Small bowel: No dilatation or inflammation. --Colon: No focal abnormality. --Appendix: Normal. Vascular/Lymphatic: Normal course and caliber of the major abdominal vessels. --No retroperitoneal lymphadenopathy. --No mesenteric lymphadenopathy. --No pelvic or inguinal lymphadenopathy. Reproductive: The uterus is not well evaluated in the absence of IV contrast. Other: There is a trace volume of ascites. The abdominal wall is normal. Musculoskeletal. No acute displaced fractures. IMPRESSION: 1. Suboptimal evaluation of the uterus in the absence of IV contrast. See separate recent ultrasound for further evaluation and recommendations. 2. Anemia. 3. Small right-sided pleural effusion. 4. Trace abdominal ascites. 5. Atrophic left kidney which may be secondary to a distal left ureteral stone measuring approximately 7 mm. The left kidney is likely nonfunctional as there is no left-sided hydronephrosis. Electronically Signed   By: Constance Holster M.D.   On: 09/15/2019 02:16   US PELVIS (TRANSABDOMINAL ONLY)  Result Date: 09/14/2019 CLINICAL DATA:  Postmenopausal vaginal bleeding. EXAM: TRANSABDOMINAL ULTRASOUND OF PELVIS TECHNIQUE: Transabdominal ultrasound examination of the pelvis was performed including evaluation of the uterus, ovaries, adnexal regions, and pelvic cul-de-sac. Attempt was made at transvaginal exam, however patient did not tolerate transvaginal probe. COMPARISON:  None. FINDINGS: Uterus Measurements: 10.0 x 5.0 x 6.4 cm = volume: 164 mL. No fibroids or other mass visualized. Endometrium Poorly defined. Endometrium appears mildly thickened, tentatively measured at 8 mm. Right ovary Not visualized.  No adnexal mass. Left ovary Not visualized.  No adnexal mass. Other findings:  No abnormal free fluid. IMPRESSION: Endometrium is poorly defined, however  appears thickened measuring approximately 8-9 mm. In the setting of post-menopausal bleeding, endometrial sampling is indicated to exclude carcinoma. If results are benign, sonohysterogram should be considered for focal lesion work-up. (Ref: Radiological Reasoning: Algorithmic Workup of Abnormal Vaginal Bleeding with Endovaginal Sonography and Sonohysterography. AJR 2008; [id number]:G83-66) Electronically Signed   By: Keith Rake M.D.   On: 09/14/2019 23:23   DG Chest Port 1 View  Result Date: 09/14/2019 CLINICAL DATA:  Shortness of breath. EXAM: PORTABLE CHEST 1 VIEW COMPARISON:  03/06/2014 FINDINGS: Enlarged cardiac silhouette with an interval significant increase in size.  Clear lungs. Interval prominence of the pulmonary vasculature in the upper lung zones. No pleural fluid. Thoracic spine degenerative changes. Interval probable old, healed left humeral neck fracture. Mild right shoulder degenerative changes. IMPRESSION: Interval cardiomegaly and pulmonary vascular congestion in the upper lung zones. Electronically Signed   By: Claudie Revering M.D.   On: 09/14/2019 16:58   Thank you so much for allowing [place] to participate in the care of this patient.  We will continue to follow with you. We will arrange outpatient follow-up in our office to complete the work-up. Please call the attending OB/GYN physician with questions or concerns at [phone] M-F 8a-5p, and [phone] for all after hours and weekend calls.

## 2019-09-15 NOTE — Progress Notes (Signed)
CRITICAL VALUE ALERT  Critical Value:  Hgb 6.8  Date & Time Notied:  09/15/2019  Provider Notified: Dr Barb Merino  Orders Received/Actions taken: Awaiting for further orders

## 2019-09-15 NOTE — Progress Notes (Signed)
PROGRESS NOTE    Jaeleah Smyser  RDE:[id number] DOB: 30-Sep-1955 DOA: 09/14/2019 PCP: Maren Reamer, MD (Inactive)    Brief Narrative:  64 year old female with history of hypertension, type 2 diabetes and asthma presented to the emergency room with vaginal bleeding, intermittent for the last few months, progressive shortness of breath and weakness over the last 3 weeks.  Has not been on any medications or physician care for last 2 years.  Has developed itching all over the body as well as leg swelling. In the emergency room, hypertensive 220/95.  Hemoglobin 4.8, platelets 115, potassium 3.1, creatinine 1.9 and BUN 27.  Chest x-ray showed some cardiomegaly.  Received blood pressure medications and admitted to the hospital with transfusions.   Assessment & Plan:   Principal Problem:   Symptomatic anemia Active Problems:   DM type 2 (diabetes mellitus, type 2) (HCC)   HTN (hypertension)   Postmenopausal bleeding   Thrombocytopenia (HCC)   Generalized pruritus   Acute on chronic renal insufficiency   Hyperuricemia   Hypokalemia  Severe symptomatic anemia due to abnormal vaginal bleeding in postmenopausal woman. Hemoglobin 4.8, 2 units PRBC with appropriate response.  Hemoglobin 6.8.  Due to chronic nature of anemia, will hold off on further transfusion and monitor.  Start oral iron.  1 unit of IV iron while in the hospital.  Recheck levels tomorrow morning. CT scan with no evidence of diffuse abdominal disease.  Vaginal ultrasound consistent with thickened endometrium.  Seen by OB/GYN, started on progesterone.  They will be scheduling outpatient Pap smear and endometrial biopsy.  Type 2 diabetes: Controlled.  A1c is 5.7, this may reflect false low reading because of severe anemia.  Used to be on insulin.  Continue on sliding scale insulin.  Will probably discharge on Metformin.  Uncontrolled hypertension: Blood pressures are uncontrolled.  Started on amlodipine and labetalol.  Titrate  antihypertensives to control blood pressures to keep systolic blood pressure less than 180.  Will need further up titration up blood pressure medications.  Thrombocytopenia: Exact cause unknown.  Platelets 150-66.  WBC is normal.  We will recheck level tomorrow morning, if persistently low may need bone marrow biopsy.  Chronic kidney disease stage III: Baseline creatinine from the records 1.2.  Probably progressive kidney disease due to untreated hypertension.  Will need close monitoring.  Hypokalemia: Replace aggressively and recheck levels tomorrow morning.  Advance to regular diet.  Advance activities.  DVT prophylaxis: SCDs Code Status: Full code Family Communication: None Disposition Plan: patient is from home. Anticipated DC to home, Barriers to discharge severe anemia requiring transfusions, gynecological work-up pending.  Electrolyte replacements.   Consultants:   OB/GYN  Procedures:   None  Antimicrobials:   None   Subjective: Patient seen and examined.  No overnight events.  Received 2 units of red cells.  Complains of itching all over and dry wounds on the legs multiple parts.  Objective: Vitals:   09/15/19 0110 09/15/19 0406 09/15/19 1032 09/15/19 1323  BP: (!) 143/74 (!) 145/69  (!) 124/107  Pulse: 68 (!) 59 72 62  Resp: _0 Temp: 97.6 F (36.4 C) (!) 97.4 F (36.3 C)  98.3 F (36.8 C)  TempSrc: Oral Oral  Oral  SpO2: 100% 100%  97%  Weight:  76.1 kg    Height: 5' (1.524 m)       Intake/Output Summary (Last 24 hours) at 09/15/2019 1345 Last data filed at 09/15/2019 0406 Gross per 24 hour  Intake 964  ml  Output 300 ml  Net 664 ml   Filed Weights   09/15/19 0406  Weight: 76.1 kg    Examination:  General exam: Appears calm and comfortable, on room air.  Chronically sick looking. Respiratory system: Clear to auscultation. Respiratory effort normal.  No added sounds. Cardiovascular system: S1 & S2 heard, RRR. No JVD, murmurs, rubs, gallops or  clicks.  1+ bilateral pedal edema.  Multiple areas of scabs, some open scratchy wounds with no localized tenderness or underlying collection. Gastrointestinal system: Abdomen is nondistended, soft and nontender. No organomegaly or masses felt. Normal bowel sounds heard. Central nervous system: Alert and oriented. No focal neurological deficits. Extremities: Symmetric 5 x 5 power. Skin: No rashes, lesions or ulcers Psychiatry: Judgement and insight appear normal. Mood & affect appropriate.     Data Reviewed: I have personally reviewed following labs and imaging studies  CBC: Recent Labs  Lab 09/14/19 1620 09/14/19 1725 09/15/19 0616  WBC 6.8  --  6.7  NEUTROABS 5.4  --   --   HGB 4.8* 4.6* 6.8*  HCT 17.6* 16.9* 22.1*  MCV 83.4  --  82.5  PLT 115*  --  66*   Basic Metabolic Panel: Recent Labs  Lab 09/14/19 1620 09/15/19 0616  NA 140 140  K 3.1* 2.7*  CL 112* 109  CO2 18* 19*  GLUCOSE 113* 92  BUN 27* 23  CREATININE 1.90* 1.78*  CALCIUM 8.2* 8.3*  MG  --  2.1  PHOS  --  3.9   GFR: Estimated Creatinine Clearance: 28.7 mL/min (A) (by C-G formula based on SCr of 1.78 mg/dL (H)). Liver Function Tests: Recent Labs  Lab 09/14/19 1620  AST 31  ALT 19  ALKPHOS 80  BILITOT 0.6  PROT 7.4  ALBUMIN 3.0*   No results for input(s): LIPASE, AMYLASE in the last 168 hours. No results for input(s): AMMONIA in the last 168 hours. Coagulation Profile: Recent Labs  Lab 09/14/19 1725  INR 1.1   Cardiac Enzymes: No results for input(s): CKTOTAL, CKMB, CKMBINDEX, TROPONINI in the last 168 hours. BNP (last 3 results) No results for input(s): PROBNP in the last 8760 hours. HbA1C: Recent Labs    09/15/19 0616  HGBA1C 5.7*   CBG: Recent Labs  Lab 09/14/19 2128 09/15/19 0744 09/15/19 1219  GLUCAP 79 78 75   Lipid Profile: No results for input(s): CHOL, HDL, LDLCALC, TRIG, CHOLHDL, LDLDIRECT in the last 72 hours. Thyroid Function Tests: Recent Labs    09/15/19 0616    TSH 3.639   Anemia Panel: Recent Labs    09/14/19 1725  VITAMINB12 1,247*  FOLATE 6.9  FERRITIN 10*  TIBC 374  IRON 115  RETICCTPCT 1.7   Sepsis Labs: No results for input(s): PROCALCITON, LATICACIDVEN in the last 168 hours.  Recent Results (from the past 240 hour(s))  SARS CORONAVIRUS 2 (TAT 6-24 HRS) Nasopharyngeal Nasopharyngeal Swab     Status: None   Collection Time: 09/14/19  5:25 PM   Specimen: Nasopharyngeal Swab  Result Value Ref Range Status   SARS Coronavirus 2 NEGATIVE NEGATIVE Final    Comment: (NOTE) SARS-CoV-2 target nucleic acids are NOT DETECTED. The SARS-CoV-2 RNA is generally detectable in upper and lower respiratory specimens during the acute phase of infection. Negative results do not preclude SARS-CoV-2 infection, do not rule out co-infections with other pathogens, and should not be used as the sole basis for treatment or other patient management decisions. Negative results must be combined with clinical observations, patient history,  and epidemiological information. The expected result is Negative. Fact Sheet for Patients: SugarRoll.be Fact Sheet for Healthcare Providers: https://www.woods-mathews.com/ This test is not yet approved or cleared by the [place] FDA and  has been authorized for detection and/or diagnosis of SARS-CoV-2 by FDA under an Emergency Use Authorization (EUA). This EUA will remain  in effect (meaning this test can be used) for the duration of the COVID-19 declaration under Section 56 4(b)(1) of the Act, 21 U.S.C. section 360bbb-3(b)(1), unless the authorization is terminated or revoked sooner. Performed at [facility], [place] [address] [place], [place] [address]          Radiology Studies: CT ABDOMEN PELVIS WO CONTRAST  Result Date: 09/15/2019 CLINICAL DATA:  Nausea and vomiting.  Concern for pelvic malignancy. EXAM: CT ABDOMEN AND PELVIS WITHOUT CONTRAST TECHNIQUE:  Multidetector CT imaging of the abdomen and pelvis was performed following the standard protocol without IV contrast. COMPARISON:  September 14, 2019 FINDINGS: Lower chest: There is a small right-sided pleural effusion.The heart size is mildly enlarged. The intracardiac blood pool is hypodense relative to the adjacent myocardium consistent with anemia. Hepatobiliary: The liver is normal. Normal gallbladder.There is no biliary ductal dilation. Pancreas: Normal contours without ductal dilatation. No peripancreatic fluid collection. Spleen: No splenic laceration or hematoma. Adrenals/Urinary Tract: --Adrenal glands: No adrenal hemorrhage. --Right kidney/ureter: No hydronephrosis or perinephric hematoma. --Left kidney/ureter: The left kidney is atrophic. This may be secondary to a distal left ureteral stone measuring approximately 7 mm (axial series 3, image 58), however the ureter at this level is difficult to follow. --Urinary bladder: Unremarkable. Stomach/Bowel: --Stomach/Duodenum: No hiatal hernia or other gastric abnormality. Normal duodenal course and caliber. --Small bowel: No dilatation or inflammation. --Colon: No focal abnormality. --Appendix: Normal. Vascular/Lymphatic: Normal course and caliber of the major abdominal vessels. --No retroperitoneal lymphadenopathy. --No mesenteric lymphadenopathy. --No pelvic or inguinal lymphadenopathy. Reproductive: The uterus is not well evaluated in the absence of IV contrast. Other: There is a trace volume of ascites. The abdominal wall is normal. Musculoskeletal. No acute displaced fractures. IMPRESSION: 1. Suboptimal evaluation of the uterus in the absence of IV contrast. See separate recent ultrasound for further evaluation and recommendations. 2. Anemia. 3. Small right-sided pleural effusion. 4. Trace abdominal ascites. 5. Atrophic left kidney which may be secondary to a distal left ureteral stone measuring approximately 7 mm. The left kidney is likely nonfunctional as  there is no left-sided hydronephrosis. Electronically Signed   By: Constance Holster M.D.   On: 09/15/2019 02:16   US PELVIS (TRANSABDOMINAL ONLY)  Result Date: 09/14/2019 CLINICAL DATA:  Postmenopausal vaginal bleeding. EXAM: TRANSABDOMINAL ULTRASOUND OF PELVIS TECHNIQUE: Transabdominal ultrasound examination of the pelvis was performed including evaluation of the uterus, ovaries, adnexal regions, and pelvic cul-de-sac. Attempt was made at transvaginal exam, however patient did not tolerate transvaginal probe. COMPARISON:  None. FINDINGS: Uterus Measurements: 10.0 x 5.0 x 6.4 cm = volume: 164 mL. No fibroids or other mass visualized. Endometrium Poorly defined. Endometrium appears mildly thickened, tentatively measured at 8 mm. Right ovary Not visualized.  No adnexal mass. Left ovary Not visualized.  No adnexal mass. Other findings:  No abnormal free fluid. IMPRESSION: Endometrium is poorly defined, however appears thickened measuring approximately 8-9 mm. In the setting of post-menopausal bleeding, endometrial sampling is indicated to exclude carcinoma. If results are benign, sonohysterogram should be considered for focal lesion work-up. (Ref: Radiological Reasoning: Algorithmic Workup of Abnormal Vaginal Bleeding with Endovaginal Sonography and Sonohysterography. AJR 2008; 117:B56-70) Electronically Signed  By: Keith Rake M.D.   On: 09/14/2019 23:23   DG Chest Port 1 View  Result Date: 09/14/2019 CLINICAL DATA:  Shortness of breath. EXAM: PORTABLE CHEST 1 VIEW COMPARISON:  03/06/2014 FINDINGS: Enlarged cardiac silhouette with an interval significant increase in size. Clear lungs. Interval prominence of the pulmonary vasculature in the upper lung zones. No pleural fluid. Thoracic spine degenerative changes. Interval probable old, healed left humeral neck fracture. Mild right shoulder degenerative changes. IMPRESSION: Interval cardiomegaly and pulmonary vascular congestion in the upper lung zones.  Electronically Signed   By: Claudie Revering M.D.   On: 09/14/2019 16:58        Scheduled Meds: . amLODipine  10 mg Oral Daily  . atorvastatin  40 mg Oral Daily  . [START ON 09/16/2019] influenza vaccine adjuvanted  0.5 mL Intramuscular Tomorrow-1000  . insulin aspart  0-15 Units Subcutaneous TID WC  . insulin aspart  0-5 Units Subcutaneous QHS  . labetalol  200 mg Oral TID  . megestrol  40 mg Oral BID  . [START ON 09/16/2019] pneumococcal 23 valent vaccine  0.5 mL Intramuscular Tomorrow-1000  . potassium chloride  40 mEq Oral TID   Continuous Infusions: . sodium chloride       LOS: 1 day    Time spent: 30 minutes    Barb Merino, MD [facility] Pager [phone]

## 2019-09-15 NOTE — Progress Notes (Addendum)
CRITICAL VALUE ALERT  Critical Value: Potassium 2.7 Date & Time Notied:  09/15/2019 @0943   Provider Notified: Barb Merino Orders Received/Actions taken: Awaiting for further orders

## 2019-09-16 ENCOUNTER — Inpatient Hospital Stay (HOSPITAL_COMMUNITY): Payer: Medicare Other

## 2019-09-16 DIAGNOSIS — R52 Pain, unspecified: Secondary | ICD-10-CM

## 2019-09-16 DIAGNOSIS — N189 Chronic kidney disease, unspecified: Secondary | ICD-10-CM

## 2019-09-16 DIAGNOSIS — N289 Disorder of kidney and ureter, unspecified: Secondary | ICD-10-CM

## 2019-09-16 DIAGNOSIS — M7989 Other specified soft tissue disorders: Secondary | ICD-10-CM

## 2019-09-16 LAB — CBC WITH DIFFERENTIAL/PLATELET
Abs Immature Granulocytes: 0.03 10*3/uL (ref 0.00–0.07)
Basophils Absolute: 0 10*3/uL (ref 0.0–0.1)
Basophils Relative: 1 %
Eosinophils Absolute: 0.1 10*3/uL (ref 0.0–0.5)
Eosinophils Relative: 1 %
HCT: 20.4 % — ABNORMAL LOW (ref 36.0–46.0)
Hemoglobin: 6.3 g/dL — CL (ref 12.0–15.0)
Immature Granulocytes: 1 %
Lymphocytes Relative: 12 %
Lymphs Abs: 0.8 10*3/uL (ref 0.7–4.0)
MCH: 25.1 pg — ABNORMAL LOW (ref 26.0–34.0)
MCHC: 30.9 g/dL (ref 30.0–36.0)
MCV: 81.3 fL (ref 80.0–100.0)
Monocytes Absolute: 0.3 10*3/uL (ref 0.1–1.0)
Monocytes Relative: 4 %
Neutro Abs: 5.3 10*3/uL (ref 1.7–7.7)
Neutrophils Relative %: 81 %
Platelets: 55 10*3/uL — ABNORMAL LOW (ref 150–400)
RBC: 2.51 MIL/uL — ABNORMAL LOW (ref 3.87–5.11)
RDW: 20 % — ABNORMAL HIGH (ref 11.5–15.5)
WBC: 6.5 10*3/uL (ref 4.0–10.5)
nRBC: 1.7 % — ABNORMAL HIGH (ref 0.0–0.2)

## 2019-09-16 LAB — PREPARE RBC (CROSSMATCH)

## 2019-09-16 LAB — GLUCOSE, CAPILLARY
Glucose-Capillary: 119 mg/dL — ABNORMAL HIGH (ref 70–99)
Glucose-Capillary: 124 mg/dL — ABNORMAL HIGH (ref 70–99)
Glucose-Capillary: 75 mg/dL (ref 70–99)
Glucose-Capillary: 93 mg/dL (ref 70–99)

## 2019-09-16 LAB — FOLATE RBC
Folate, Hemolysate: 279 ng/mL
Folate, RBC: 1251 ng/mL (ref >498)
Hematocrit: 22.3 % — ABNORMAL LOW (ref 34.0–46.6)

## 2019-09-16 LAB — HEMOGLOBIN AND HEMATOCRIT, BLOOD
HCT: 26.5 % — ABNORMAL LOW (ref 36.0–46.0)
Hemoglobin: 8.4 g/dL — ABNORMAL LOW (ref 12.0–15.0)

## 2019-09-16 LAB — BASIC METABOLIC PANEL
Anion gap: 10 (ref 5–15)
BUN: 27 mg/dL — ABNORMAL HIGH (ref 8–23)
CO2: 19 mmol/L — ABNORMAL LOW (ref 22–32)
Calcium: 8.3 mg/dL — ABNORMAL LOW (ref 8.9–10.3)
Chloride: 113 mmol/L — ABNORMAL HIGH (ref 98–111)
Creatinine, Ser: 2.02 mg/dL — ABNORMAL HIGH (ref 0.44–1.00)
GFR calc Af Amer: 29 mL/min — ABNORMAL LOW (ref >60)
GFR calc non Af Amer: 25 mL/min — ABNORMAL LOW (ref >60)
Glucose, Bld: 126 mg/dL — ABNORMAL HIGH (ref 70–99)
Potassium: 3.5 mmol/L (ref 3.5–5.1)
Sodium: 142 mmol/L (ref 135–145)

## 2019-09-16 LAB — MAGNESIUM: Magnesium: 2.2 mg/dL (ref 1.7–2.4)

## 2019-09-16 LAB — PHOSPHORUS: Phosphorus: 3.3 mg/dL (ref 2.5–4.6)

## 2019-09-16 MED ORDER — MEGESTROL ACETATE 40 MG PO TABS
80.0000 mg | ORAL_TABLET | Freq: Two times a day (BID) | ORAL | Status: DC
  Administered 2019-09-16 – 2019-09-25 (×18): 80 mg via ORAL
  Filled 2019-09-16 (×19): qty 2

## 2019-09-16 MED ORDER — SODIUM CHLORIDE 0.9% IV SOLUTION: Freq: Once | INTRAVENOUS | Status: AC

## 2019-09-16 MED ORDER — POTASSIUM CHLORIDE CRYS ER 20 MEQ PO TBCR
40.0000 meq | EXTENDED_RELEASE_TABLET | Freq: Every day | ORAL | Status: DC
  Administered 2019-09-17 – 2019-09-18 (×2): 40 meq via ORAL
  Filled 2019-09-16 (×2): qty 2

## 2019-09-16 NOTE — Progress Notes (Signed)
Subjective: Interval History: her bleeding has improved with 40 mg bid. Her Hgb dropped to 6.3.  Objective: Vital signs in last 24 hours: Temp:  [97.6 F (36.4 C)-98.5 F (36.9 C)] 98.5 F (36.9 C) (03/08 1013) Pulse Rate:  [60-73] 73 (03/08 1013) Resp:  [15-18] 18 (03/08 1013) BP: (124-156)/(65-107) 137/72 (03/08 1013) SpO2:  [97 %-100 %] 100 % (03/08 1013)  Intake/Output from previous day: 03/07 0701 - 03/08 0700 In: 517 [P.O.:400] Out: -  Intake/Output this shift: Total I/O In: 250 [I.V.:250] Out: -   BP 137/72 (BP Location: Right Arm)   Pulse 73   Temp 98.5 F (36.9 C) (Oral)   Resp 18   Ht 5' (1.524 m)   Wt 76.1 kg   SpO2 100%   BMI 32.77 kg/m  General appearance: alert, cooperative and appears stated age Head: Normocephalic, without obvious abnormality, atraumatic Neck: supple, symmetrical, trachea midline Lungs: normal effort Heart: regular rate and rhythm Abdomen: soft, non-tender; bowel sounds normal; no masses,  no organomegaly Extremities: edema 2 +, multiple excoriation Neurologic: Grossly normal  Results for orders placed or performed during the hospital encounter of 09/14/19 (from the past 24 hour(s))  Glucose, capillary     Status: None   Collection Time: 09/15/19 12:19 PM  Result Value Ref Range   Glucose-Capillary 75 70 - 99 mg/dL  Glucose, capillary     Status: None   Collection Time: 09/15/19  4:58 PM  Result Value Ref Range   Glucose-Capillary 99 70 - 99 mg/dL  Glucose, capillary     Status: Abnormal   Collection Time: 09/15/19  8:06 PM  Result Value Ref Range   Glucose-Capillary 106 (H) 70 - 99 mg/dL  CBC with Differential/Platelet     Status: Abnormal   Collection Time: 09/16/19  1:39 AM  Result Value Ref Range   WBC 6.5 4.0 - 10.5 K/uL   RBC 2.51 (L) 3.87 - 5.11 MIL/uL   Hemoglobin 6.3 (LL) 12.0 - 15.0 g/dL   HCT 20.4 (L) 36.0 - 46.0 %   MCV 81.3 80.0 - 100.0 fL   MCH 25.1 (L) 26.0 - 34.0 pg   MCHC 30.9 30.0 - 36.0 g/dL   RDW  20.0 (H) 11.5 - 15.5 %   Platelets 55 (L) 150 - 400 K/uL   nRBC 1.7 (H) 0.0 - 0.2 %   Neutrophils Relative % 81 %   Neutro Abs 5.3 1.7 - 7.7 K/uL   Lymphocytes Relative 12 %   Lymphs Abs 0.8 0.7 - 4.0 K/uL   Monocytes Relative 4 %   Monocytes Absolute 0.3 0.1 - 1.0 K/uL   Eosinophils Relative 1 %   Eosinophils Absolute 0.1 0.0 - 0.5 K/uL   Basophils Relative 1 %   Basophils Absolute 0.0 0.0 - 0.1 K/uL   Immature Granulocytes 1 %   Abs Immature Granulocytes 0.03 0.00 - 0.07 K/uL  Basic metabolic panel     Status: Abnormal   Collection Time: 09/16/19  1:39 AM  Result Value Ref Range   Sodium 142 135 - 145 mmol/L   Potassium 3.5 3.5 - 5.1 mmol/L   Chloride 113 (H) 98 - 111 mmol/L   CO2 19 (L) 22 - 32 mmol/L   Glucose, Bld 126 (H) 70 - 99 mg/dL   BUN 27 (H) 8 - 23 mg/dL   Creatinine, Ser 2.02 (H) 0.44 - 1.00 mg/dL   Calcium 8.3 (L) 8.9 - 10.3 mg/dL   GFR calc non Af Amer 25 (L) >  60 mL/min   GFR calc Af Amer 29 (L) >60 mL/min   Anion gap 10 5 - 15  Magnesium     Status: None   Collection Time: 09/16/19  1:39 AM  Result Value Ref Range   Magnesium 2.2 1.7 - 2.4 mg/dL  Phosphorus     Status: None   Collection Time: 09/16/19  1:39 AM  Result Value Ref Range   Phosphorus 3.3 2.5 - 4.6 mg/dL  Glucose, capillary     Status: Abnormal   Collection Time: 09/16/19  8:04 AM  Result Value Ref Range   Glucose-Capillary 119 (H) 70 - 99 mg/dL  Prepare RBC     Status: None   Collection Time: 09/16/19  8:36 AM  Result Value Ref Range   Order Confirmation      ORDER PROCESSED BY BLOOD BANK Performed at [facility], [place] [address], [place], [place] [address]     Studies/Results: CT ABDOMEN PELVIS WO CONTRAST  Result Date: 09/15/2019 CLINICAL DATA:  Nausea and vomiting.  Concern for pelvic malignancy. EXAM: CT ABDOMEN AND PELVIS WITHOUT CONTRAST TECHNIQUE: Multidetector CT imaging of the abdomen and pelvis was performed following the standard protocol without IV contrast.  COMPARISON:  September 14, 2019 FINDINGS: Lower chest: There is a small right-sided pleural effusion.The heart size is mildly enlarged. The intracardiac blood pool is hypodense relative to the adjacent myocardium consistent with anemia. Hepatobiliary: The liver is normal. Normal gallbladder.There is no biliary ductal dilation. Pancreas: Normal contours without ductal dilatation. No peripancreatic fluid collection. Spleen: No splenic laceration or hematoma. Adrenals/Urinary Tract: --Adrenal glands: No adrenal hemorrhage. --Right kidney/ureter: No hydronephrosis or perinephric hematoma. --Left kidney/ureter: The left kidney is atrophic. This may be secondary to a distal left ureteral stone measuring approximately 7 mm (axial series 3, image 58), however the ureter at this level is difficult to follow. --Urinary bladder: Unremarkable. Stomach/Bowel: --Stomach/Duodenum: No hiatal hernia or other gastric abnormality. Normal duodenal course and caliber. --Small bowel: No dilatation or inflammation. --Colon: No focal abnormality. --Appendix: Normal. Vascular/Lymphatic: Normal course and caliber of the major abdominal vessels. --No retroperitoneal lymphadenopathy. --No mesenteric lymphadenopathy. --No pelvic or inguinal lymphadenopathy. Reproductive: The uterus is not well evaluated in the absence of IV contrast. Other: There is a trace volume of ascites. The abdominal wall is normal. Musculoskeletal. No acute displaced fractures. IMPRESSION: 1. Suboptimal evaluation of the uterus in the absence of IV contrast. See separate recent ultrasound for further evaluation and recommendations. 2. Anemia. 3. Small right-sided pleural effusion. 4. Trace abdominal ascites. 5. Atrophic left kidney which may be secondary to a distal left ureteral stone measuring approximately 7 mm. The left kidney is likely nonfunctional as there is no left-sided hydronephrosis. Electronically Signed   By: Constance Holster M.D.   On: 09/15/2019 02:16   US  PELVIS (TRANSABDOMINAL ONLY)  Result Date: 09/14/2019 CLINICAL DATA:  Postmenopausal vaginal bleeding. EXAM: TRANSABDOMINAL ULTRASOUND OF PELVIS TECHNIQUE: Transabdominal ultrasound examination of the pelvis was performed including evaluation of the uterus, ovaries, adnexal regions, and pelvic cul-de-sac. Attempt was made at transvaginal exam, however patient did not tolerate transvaginal probe. COMPARISON:  None. FINDINGS: Uterus Measurements: 10.0 x 5.0 x 6.4 cm = volume: 164 mL. No fibroids or other mass visualized. Endometrium Poorly defined. Endometrium appears mildly thickened, tentatively measured at 8 mm. Right ovary Not visualized.  No adnexal mass. Left ovary Not visualized.  No adnexal mass. Other findings:  No abnormal free fluid. IMPRESSION: Endometrium is poorly defined, however appears thickened measuring  approximately 8-9 mm. In the setting of post-menopausal bleeding, endometrial sampling is indicated to exclude carcinoma. If results are benign, sonohysterogram should be considered for focal lesion work-up. (Ref: Radiological Reasoning: Algorithmic Workup of Abnormal Vaginal Bleeding with Endovaginal Sonography and Sonohysterography. AJR 2008; 673:A19-37) Electronically Signed   By: Keith Rake M.D.   On: 09/14/2019 23:23   DG Chest Port 1 View  Result Date: 09/14/2019 CLINICAL DATA:  Shortness of breath. EXAM: PORTABLE CHEST 1 VIEW COMPARISON:  03/06/2014 FINDINGS: Enlarged cardiac silhouette with an interval significant increase in size. Clear lungs. Interval prominence of the pulmonary vasculature in the upper lung zones. No pleural fluid. Thoracic spine degenerative changes. Interval probable old, healed left humeral neck fracture. Mild right shoulder degenerative changes. IMPRESSION: Interval cardiomegaly and pulmonary vascular congestion in the upper lung zones. Electronically Signed   By: Claudie Revering M.D.   On: 09/14/2019 16:58    Scheduled Meds: . sodium chloride   Intravenous  Once  . amLODipine  10 mg Oral Daily  . atorvastatin  40 mg Oral Daily  . ferrous sulfate  325 mg Oral BID WC  . influenza vaccine adjuvanted  0.5 mL Intramuscular Tomorrow-1000  . insulin aspart  0-15 Units Subcutaneous TID WC  . insulin aspart  0-5 Units Subcutaneous QHS  . labetalol  200 mg Oral TID  . megestrol  80 mg Oral BID  . pneumococcal 23 valent vaccine  0.5 mL Intramuscular Tomorrow-1000  . potassium chloride  40 mEq Oral TID   Continuous Infusions: . sodium chloride     PRN Meds:acetaminophen **OR** acetaminophen, diphenhydrAMINE-zinc acetate, hydrOXYzine, ondansetron **OR** ondansetron (ZOFRAN) IV, oxyCODONE  Assessment/Plan: Principal Problem:   Symptomatic anemia Active Problems:   DM type 2 (diabetes mellitus, type 2) (HCC)   HTN (hypertension)   Postmenopausal bleeding   Thrombocytopenia (HCC)   Generalized pruritus   Acute on chronic renal insufficiency   Hyperuricemia   Hypokalemia  Have increased her Megace to 80 mg bid to decrease her bleeding further. Will continue to follow-   LOS: 2 days   Donnamae Jude, MD 09/16/2019 10:32 AM

## 2019-09-16 NOTE — Progress Notes (Signed)
Lower extremity venous has been completed.   Preliminary results in CV Proc.   Abram Sander 09/16/2019 11:20 AM

## 2019-09-16 NOTE — Consult Note (Signed)
WOC Nurse Consult Note: Patient receiving care in [place]. Reason for Consult: "multiple leg wounds" Wound type: unknown etiology.  Patient states her legs began to severely itch about a month ago. She began scratching her legs, then the wounds developed.  The patient has many wounds to the BLE in various stages.  Some are small and superficial, others are large, dry, and dark or dry and have a green hue to them.  I don't know if this is the beginning of a condition such as calciphylaxis, or some other type of dermatologic condition.  The patient might benefit from a biopsy of one or more of these wounds. Pressure Injury POA: Yes/No/NA Measurement: too numerous to measure Wound bed: as described above Drainage (amount, consistency, odor) none detected Periwound: edematous bilaterally Dressing procedure/placement/frequency: Gently wash BLE with soap and water. Pat dry.  Apply Crit[place] Clear (purple and white tube in clean utility) to BLE.  Place Xeroform gauzes Kellie Simmering 294) over BLE wounds. Wrap Kerlex from just behind toes to just below knees. Then wrap 4 inch Ace wraps over the kerlex. Perform daily. Monitor the wound area(s) for worsening of condition such as: Signs/symptoms of infection,  Increase in size,  Development of or worsening of odor, Development of pain, or increased pain at the affected locations.  Notify the medical team if any of these develop.  Thank you for the consult.  Discussed plan of care with the patient and bedside nurse.  [place] [facility] nurse will not follow at this time.  Please re-consult the [place] team if needed.  Val Riles, RN, MSN, CWOCN, CNS-BC, pager [phone]

## 2019-09-16 NOTE — Progress Notes (Signed)
PROGRESS NOTE    Vanessa [name]  LOV:[id number] DOB: 10/11/1955 DOA: 09/14/2019 PCP: Maren Reamer, MD (Inactive)    Brief Narrative:  64 year old female with history of hypertension, type 2 diabetes and asthma presented to the emergency room with vaginal bleeding, intermittent for the last few months, progressive shortness of breath and weakness over the last 3 weeks.  Has not been on any medications or physician care for last 2 years due to no insurance.  Has developed itching all over the body as well as leg swelling. In the emergency room, hypertensive 220/95.  Hemoglobin 4.8, platelets 115, potassium 3.1, creatinine 1.9 and BUN 27.  Chest x-ray showed some cardiomegaly.  Received blood pressure medications and admitted to the hospital with transfusions.   Assessment & Plan:   Principal Problem:   Symptomatic anemia Active Problems:   DM type 2 (diabetes mellitus, type 2) (HCC)   HTN (hypertension)   Postmenopausal bleeding   Thrombocytopenia (HCC)   Generalized pruritus   Acute on chronic renal insufficiency   Hyperuricemia   Hypokalemia   Severe symptomatic anemia likely 2/2 ?abnormal vaginal bleeding in postmenopausal woman Vaginal bleeding is improving Hemoglobin 4.8 on admission, s/p 2 units of PRBC-->6.8, now 6.3, will give another 1U of PRBC on 09/16/19 CT scan with no evidence of abdominal disease, except for atrophic L kidney Vaginal ultrasound consistent with thickened endometrium OBGYN on board, rec Megace 80 mg BID, plan for outpatient pap smear and endometrial biopsy  S/p 1 dose of feraheme on 09/15/19, continue oral iron supplement Daily CBC  HTN Stable Continue amlodipine, labetolol   Type 2 diabetes mellitus A1c is 5.7, this may reflect false low reading because of severe anemia Used to be on insulin Continue SSI, accuchecks, hypoglycemic protocol   ?Chronic kidney disease stage III Last Cr 1.2 in 2018, on presentation 1.9 Daily  BMP  Thrombocytopenia Unclear etiology CT abdomen/pelvis with normal liver, spleen May involve heme-onc to further evaluate Daily CBC  Hypokalemia Replace as needed  Multiple lesions/wounds on bilateral lower extremity Unknown etiology, appears chronic, pruritic WOC consulted ??  May need biopsy to further evaluate Vs outpt dermatologist  Obesity Lifestyle modification advised     DVT prophylaxis: SCDs Code Status: Full code Family Communication: Discussed extensively with patient Disposition Plan: patient is from home. Anticipated DC to home, Barriers to discharge severe anemia requiring transfusions.   Consultants:   OB/GYN  Procedures:   None  Antimicrobials:   None   Subjective: Patient seen and examined at bedside.  Reports feeling weak overall.  Still complains of generalized pruritus, especially over her bilateral lower extremities with multiple scabs/lesions/wounds noted.  Patient denies any worsening shortness of breath, chest pain, abdominal pain, nausea/vomiting, fever/chills.  Reports bleeding is resolving.    Objective: Vitals:   09/16/19 0351 09/16/19 1013 09/16/19 1045 09/16/19 1414  BP: (!) 156/81 137/72 (!) 141/74 137/79  Pulse: 72 73 74 70  Resp: 18 18 18 16   Temp: 97.6 F (36.4 C) 98.5 F (36.9 C) 98.4 F (36.9 C) 98.7 F (37.1 C)  TempSrc: Oral Oral Oral Oral  SpO2: 100% 100% 100% 100%  Weight:      Height:        Intake/Output Summary (Last 24 hours) at 09/16/2019 1516 Last data filed at 09/16/2019 1410 Gross per 24 hour  Intake 807 ml  Output --  Net 807 ml   Filed Weights   09/15/19 0406  Weight: 76.1 kg    Examination:  General:  NAD   Cardiovascular: S1, S2 present  Respiratory: CTAB  Abdomen: Soft, nontender, nondistended, bowel sounds present  Musculoskeletal: Multiple areas of scabs/wounds/lesions on BLE, pruritic, trace bilateral pedal edema noted  Skin: Normal  Psychiatry: Normal mood   Data Reviewed:  I have personally reviewed following labs and imaging studies  CBC: Recent Labs  Lab 09/14/19 1620 09/14/19 1725 09/15/19 0616 09/16/19 0139  WBC 6.8  --  6.7 6.5  NEUTROABS 5.4  --   --  5.3  HGB 4.8* 4.6* 6.8* 6.3*  HCT 17.6* 16.9* 22.1* 20.4*  MCV 83.4  --  82.5 81.3  PLT 115*  --  66* 55*   Basic Metabolic Panel: Recent Labs  Lab 09/14/19 1620 09/15/19 0616 09/16/19 0139  NA 140 140 142  K 3.1* 2.7* 3.5  CL 112* 109 113*  CO2 18* 19* 19*  GLUCOSE 113* 92 126*  BUN 27* 23 27*  CREATININE 1.90* 1.78* 2.02*  CALCIUM 8.2* 8.3* 8.3*  MG  --  2.1 2.2  PHOS  --  3.9 3.3   GFR: Estimated Creatinine Clearance: 25.3 mL/min (A) (by C-G formula based on SCr of 2.02 mg/dL (H)). Liver Function Tests: Recent Labs  Lab 09/14/19 1620  AST 31  ALT 19  ALKPHOS 80  BILITOT 0.6  PROT 7.4  ALBUMIN 3.0*   No results for input(s): LIPASE, AMYLASE in the last 168 hours. No results for input(s): AMMONIA in the last 168 hours. Coagulation Profile: Recent Labs  Lab 09/14/19 1725  INR 1.1   Cardiac Enzymes: No results for input(s): CKTOTAL, CKMB, CKMBINDEX, TROPONINI in the last 168 hours. BNP (last 3 results) No results for input(s): PROBNP in the last 8760 hours. HbA1C: Recent Labs    09/15/19 0616  HGBA1C 5.7*   CBG: Recent Labs  Lab 09/15/19 1219 09/15/19 1658 09/15/19 2006 09/16/19 0804 09/16/19 1228  GLUCAP 75 99 106* 119* 124*   Lipid Profile: No results for input(s): CHOL, HDL, LDLCALC, TRIG, CHOLHDL, LDLDIRECT in the last 72 hours. Thyroid Function Tests: Recent Labs    09/15/19 0616  TSH 3.639   Anemia Panel: Recent Labs    09/14/19 1725  VITAMINB12 1,247*  FOLATE 6.9  FERRITIN 10*  TIBC 374  IRON 115  RETICCTPCT 1.7   Sepsis Labs: No results for input(s): PROCALCITON, LATICACIDVEN in the last 168 hours.  Recent Results (from the past 240 hour(s))  SARS CORONAVIRUS 2 (TAT 6-24 HRS) Nasopharyngeal Nasopharyngeal Swab     Status: None    Collection Time: 09/14/19  5:25 PM   Specimen: Nasopharyngeal Swab  Result Value Ref Range Status   SARS Coronavirus 2 NEGATIVE NEGATIVE Final    Comment: (NOTE) SARS-CoV-2 target nucleic acids are NOT DETECTED. The SARS-CoV-2 RNA is generally detectable in upper and lower respiratory specimens during the acute phase of infection. Negative results do not preclude SARS-CoV-2 infection, do not rule out co-infections with other pathogens, and should not be used as the sole basis for treatment or other patient management decisions. Negative results must be combined with clinical observations, patient history, and epidemiological information. The expected result is Negative. Fact Sheet for Patients: SugarRoll.be Fact Sheet for Healthcare Providers: https://www.woods-mathews.com/ This test is not yet approved or cleared by the [place] FDA and  has been authorized for detection and/or diagnosis of SARS-CoV-2 by FDA under an Emergency Use Authorization (EUA). This EUA will remain  in effect (meaning this test can be used) for the duration of the COVID-19 declaration under Section  56 4(b)(1) of the Act, 21 U.S.C. section 360bbb-3(b)(1), unless the authorization is terminated or revoked sooner. Performed at [facility], [place] [address] [place], [place] [address]          Radiology Studies: CT ABDOMEN PELVIS WO CONTRAST  Result Date: 09/15/2019 CLINICAL DATA:  Nausea and vomiting.  Concern for pelvic malignancy. EXAM: CT ABDOMEN AND PELVIS WITHOUT CONTRAST TECHNIQUE: Multidetector CT imaging of the abdomen and pelvis was performed following the standard protocol without IV contrast. COMPARISON:  September 14, 2019 FINDINGS: Lower chest: There is a small right-sided pleural effusion.The heart size is mildly enlarged. The intracardiac blood pool is hypodense relative to the adjacent myocardium consistent with anemia. Hepatobiliary: The liver is  normal. Normal gallbladder.There is no biliary ductal dilation. Pancreas: Normal contours without ductal dilatation. No peripancreatic fluid collection. Spleen: No splenic laceration or hematoma. Adrenals/Urinary Tract: --Adrenal glands: No adrenal hemorrhage. --Right kidney/ureter: No hydronephrosis or perinephric hematoma. --Left kidney/ureter: The left kidney is atrophic. This may be secondary to a distal left ureteral stone measuring approximately 7 mm (axial series 3, image 58), however the ureter at this level is difficult to follow. --Urinary bladder: Unremarkable. Stomach/Bowel: --Stomach/Duodenum: No hiatal hernia or other gastric abnormality. Normal duodenal course and caliber. --Small bowel: No dilatation or inflammation. --Colon: No focal abnormality. --Appendix: Normal. Vascular/Lymphatic: Normal course and caliber of the major abdominal vessels. --No retroperitoneal lymphadenopathy. --No mesenteric lymphadenopathy. --No pelvic or inguinal lymphadenopathy. Reproductive: The uterus is not well evaluated in the absence of IV contrast. Other: There is a trace volume of ascites. The abdominal wall is normal. Musculoskeletal. No acute displaced fractures. IMPRESSION: 1. Suboptimal evaluation of the uterus in the absence of IV contrast. See separate recent ultrasound for further evaluation and recommendations. 2. Anemia. 3. Small right-sided pleural effusion. 4. Trace abdominal ascites. 5. Atrophic left kidney which may be secondary to a distal left ureteral stone measuring approximately 7 mm. The left kidney is likely nonfunctional as there is no left-sided hydronephrosis. Electronically Signed   By: Constance Holster M.D.   On: 09/15/2019 02:16   US PELVIS (TRANSABDOMINAL ONLY)  Result Date: 09/14/2019 CLINICAL DATA:  Postmenopausal vaginal bleeding. EXAM: TRANSABDOMINAL ULTRASOUND OF PELVIS TECHNIQUE: Transabdominal ultrasound examination of the pelvis was performed including evaluation of the uterus,  ovaries, adnexal regions, and pelvic cul-de-sac. Attempt was made at transvaginal exam, however patient did not tolerate transvaginal probe. COMPARISON:  None. FINDINGS: Uterus Measurements: 10.0 x 5.0 x 6.4 cm = volume: 164 mL. No fibroids or other mass visualized. Endometrium Poorly defined. Endometrium appears mildly thickened, tentatively measured at 8 mm. Right ovary Not visualized.  No adnexal mass. Left ovary Not visualized.  No adnexal mass. Other findings:  No abnormal free fluid. IMPRESSION: Endometrium is poorly defined, however appears thickened measuring approximately 8-9 mm. In the setting of post-menopausal bleeding, endometrial sampling is indicated to exclude carcinoma. If results are benign, sonohysterogram should be considered for focal lesion work-up. (Ref: Radiological Reasoning: Algorithmic Workup of Abnormal Vaginal Bleeding with Endovaginal Sonography and Sonohysterography. AJR 2008; 408:X44-81) Electronically Signed   By: Keith Rake M.D.   On: 09/14/2019 23:23   DG Chest Port 1 View  Result Date: 09/14/2019 CLINICAL DATA:  Shortness of breath. EXAM: PORTABLE CHEST 1 VIEW COMPARISON:  03/06/2014 FINDINGS: Enlarged cardiac silhouette with an interval significant increase in size. Clear lungs. Interval prominence of the pulmonary vasculature in the upper lung zones. No pleural fluid. Thoracic spine degenerative changes. Interval probable old, healed left humeral neck  fracture. Mild right shoulder degenerative changes. IMPRESSION: Interval cardiomegaly and pulmonary vascular congestion in the upper lung zones. Electronically Signed   By: Claudie Revering M.D.   On: 09/14/2019 16:58   VAS [place] LOWER EXTREMITY VENOUS (DVT)  Result Date: 09/16/2019  Lower Venous DVTStudy Indications: Swelling, and Pain.  Comparison Study: no prior Performing Technologist: Abram Sander RVS  Examination Guidelines: A complete evaluation includes B-mode imaging, spectral Doppler, color Doppler, and power Doppler  as needed of all accessible portions of each vessel. Bilateral testing is considered an integral part of a complete examination. Limited examinations for reoccurring indications may be performed as noted. The reflux portion of the exam is performed with the patient in reverse Trendelenburg.  +---------+---------------+---------+-----------+----------+--------------+ RIGHT    CompressibilityPhasicitySpontaneityPropertiesThrombus Aging +---------+---------------+---------+-----------+----------+--------------+ CFV      Full           Yes      Yes                                 +---------+---------------+---------+-----------+----------+--------------+ SFJ      Full                                                        +---------+---------------+---------+-----------+----------+--------------+ FV Prox  Full                                                        +---------+---------------+---------+-----------+----------+--------------+ FV Mid   Full                                                        +---------+---------------+---------+-----------+----------+--------------+ FV DistalFull                                                        +---------+---------------+---------+-----------+----------+--------------+ PFV      Full                                                        +---------+---------------+---------+-----------+----------+--------------+ POP      Full           Yes      Yes                                 +---------+---------------+---------+-----------+----------+--------------+ PTV      Full                                                        +---------+---------------+---------+-----------+----------+--------------+  PERO                                                  Not visualized +---------+---------------+---------+-----------+----------+--------------+    +---------+---------------+---------+-----------+----------+--------------+ LEFT     CompressibilityPhasicitySpontaneityPropertiesThrombus Aging +---------+---------------+---------+-----------+----------+--------------+ CFV      Full           Yes      Yes                                 +---------+---------------+---------+-----------+----------+--------------+ SFJ      Full                                                        +---------+---------------+---------+-----------+----------+--------------+ FV Prox  Full                                                        +---------+---------------+---------+-----------+----------+--------------+ FV Mid   Full                                                        +---------+---------------+---------+-----------+----------+--------------+ FV DistalFull                                                        +---------+---------------+---------+-----------+----------+--------------+ PFV      Full                                                        +---------+---------------+---------+-----------+----------+--------------+ POP      Full           Yes      Yes                                 +---------+---------------+---------+-----------+----------+--------------+ PTV                                                   Not visualized +---------+---------------+---------+-----------+----------+--------------+ PERO                                                  Not visualized +---------+---------------+---------+-----------+----------+--------------+     Summary: RIGHT: - There is  no evidence of deep vein thrombosis in the lower extremity.  - No cystic structure found in the popliteal fossa.  LEFT: - There is no evidence of deep vein thrombosis in the lower extremity. However, portions of this examination were limited- see technologist comments above.  - No cystic structure found in the popliteal  fossa.  *See table(s) above for measurements and observations.    Preliminary         Scheduled Meds: . amLODipine  10 mg Oral Daily  . atorvastatin  40 mg Oral Daily  . ferrous sulfate  325 mg Oral BID WC  . influenza vaccine adjuvanted  0.5 mL Intramuscular Tomorrow-1000  . insulin aspart  0-15 Units Subcutaneous TID WC  . insulin aspart  0-5 Units Subcutaneous QHS  . labetalol  200 mg Oral TID  . megestrol  80 mg Oral BID  . pneumococcal 23 valent vaccine  0.5 mL Intramuscular Tomorrow-1000  . potassium chloride  40 mEq Oral TID   Continuous Infusions: . sodium chloride       LOS: 2 days      Alma Friendly, MD [facility]

## 2019-09-17 ENCOUNTER — Inpatient Hospital Stay (HOSPITAL_COMMUNITY): Payer: Medicare Other

## 2019-09-17 ENCOUNTER — Encounter (HOSPITAL_COMMUNITY): Payer: Self-pay | Admitting: Internal Medicine

## 2019-09-17 DIAGNOSIS — Z79899 Other long term (current) drug therapy: Secondary | ICD-10-CM

## 2019-09-17 DIAGNOSIS — Z8249 Family history of ischemic heart disease and other diseases of the circulatory system: Secondary | ICD-10-CM

## 2019-09-17 DIAGNOSIS — N179 Acute kidney failure, unspecified: Secondary | ICD-10-CM

## 2019-09-17 DIAGNOSIS — D72829 Elevated white blood cell count, unspecified: Secondary | ICD-10-CM

## 2019-09-17 DIAGNOSIS — Z794 Long term (current) use of insulin: Secondary | ICD-10-CM

## 2019-09-17 DIAGNOSIS — L299 Pruritus, unspecified: Secondary | ICD-10-CM

## 2019-09-17 DIAGNOSIS — D649 Anemia, unspecified: Secondary | ICD-10-CM

## 2019-09-17 DIAGNOSIS — Z833 Family history of diabetes mellitus: Secondary | ICD-10-CM

## 2019-09-17 DIAGNOSIS — N950 Postmenopausal bleeding: Secondary | ICD-10-CM

## 2019-09-17 DIAGNOSIS — E119 Type 2 diabetes mellitus without complications: Secondary | ICD-10-CM

## 2019-09-17 DIAGNOSIS — I10 Essential (primary) hypertension: Secondary | ICD-10-CM

## 2019-09-17 DIAGNOSIS — E785 Hyperlipidemia, unspecified: Secondary | ICD-10-CM

## 2019-09-17 DIAGNOSIS — J45909 Unspecified asthma, uncomplicated: Secondary | ICD-10-CM

## 2019-09-17 DIAGNOSIS — D696 Thrombocytopenia, unspecified: Secondary | ICD-10-CM

## 2019-09-17 LAB — GLUCOSE, CAPILLARY
Glucose-Capillary: 105 mg/dL — ABNORMAL HIGH (ref 70–99)
Glucose-Capillary: 168 mg/dL — ABNORMAL HIGH (ref 70–99)
Glucose-Capillary: 128 mg/dL — ABNORMAL HIGH (ref 70–99)
Glucose-Capillary: 143 mg/dL — ABNORMAL HIGH (ref 70–99)

## 2019-09-17 LAB — BPAM RBC
Blood Product Expiration Date: 202103102359
Blood Product Expiration Date: 202104042359
ISSUE DATE / TIME: 202103070049
ISSUE DATE / TIME: 202103081017
Unit Type and Rh: 5100
Unit Type and Rh: 9500

## 2019-09-17 LAB — DIC (DISSEMINATED INTRAVASCULAR COAGULATION)PANEL
D-Dimer, Quant: 5.71 ug/mL-FEU — ABNORMAL HIGH (ref 0.00–0.50)
Fibrinogen: 496 mg/dL — ABNORMAL HIGH (ref 210–475)
INR: 1.1 (ref 0.8–1.2)
Platelets: 52 10*3/uL — ABNORMAL LOW (ref 150–400)
Prothrombin Time: 14.5 seconds (ref 11.4–15.2)
Smear Review: NONE SEEN
aPTT: 31 seconds (ref 24–36)

## 2019-09-17 LAB — BASIC METABOLIC PANEL
Anion gap: 11 (ref 5–15)
BUN: 24 mg/dL — ABNORMAL HIGH (ref 8–23)
CO2: 18 mmol/L — ABNORMAL LOW (ref 22–32)
Calcium: 8.7 mg/dL — ABNORMAL LOW (ref 8.9–10.3)
Chloride: 115 mmol/L — ABNORMAL HIGH (ref 98–111)
Creatinine, Ser: 1.97 mg/dL — ABNORMAL HIGH (ref 0.44–1.00)
GFR calc Af Amer: 30 mL/min — ABNORMAL LOW (ref >60)
GFR calc non Af Amer: 26 mL/min — ABNORMAL LOW (ref >60)
Glucose, Bld: 137 mg/dL — ABNORMAL HIGH (ref 70–99)
Potassium: 4.1 mmol/L (ref 3.5–5.1)
Sodium: 144 mmol/L (ref 135–145)

## 2019-09-17 LAB — TYPE AND SCREEN
ABO/RH(D): O POS
Antibody Screen: NEGATIVE
Unit division: 0
Unit division: 0

## 2019-09-17 LAB — CBC WITH DIFFERENTIAL/PLATELET
Abs Immature Granulocytes: 0.18 10*3/uL — ABNORMAL HIGH (ref 0.00–0.07)
Basophils Absolute: 0 10*3/uL (ref 0.0–0.1)
Basophils Relative: 0 %
Eosinophils Absolute: 0 10*3/uL (ref 0.0–0.5)
Eosinophils Relative: 0 %
HCT: 24.7 % — ABNORMAL LOW (ref 36.0–46.0)
Hemoglobin: 7.6 g/dL — ABNORMAL LOW (ref 12.0–15.0)
Immature Granulocytes: 1 %
Lymphocytes Relative: 2 %
Lymphs Abs: 0.3 10*3/uL — ABNORMAL LOW (ref 0.7–4.0)
MCH: 25.4 pg — ABNORMAL LOW (ref 26.0–34.0)
MCHC: 30.8 g/dL (ref 30.0–36.0)
MCV: 82.6 fL (ref 80.0–100.0)
Monocytes Absolute: 0.3 10*3/uL (ref 0.1–1.0)
Monocytes Relative: 2 %
Neutro Abs: 14.2 10*3/uL — ABNORMAL HIGH (ref 1.7–7.7)
Neutrophils Relative %: 95 %
Platelets: 48 10*3/uL — ABNORMAL LOW (ref 150–400)
RBC: 2.99 MIL/uL — ABNORMAL LOW (ref 3.87–5.11)
RDW: 19.6 % — ABNORMAL HIGH (ref 11.5–15.5)
WBC: 15.1 10*3/uL — ABNORMAL HIGH (ref 4.0–10.5)
nRBC: 0.5 % — ABNORMAL HIGH (ref 0.0–0.2)

## 2019-09-17 LAB — HEPATIC FUNCTION PANEL
ALT: 15 U/L (ref 0–44)
AST: 14 U/L — ABNORMAL LOW (ref 15–41)
Albumin: 2.6 g/dL — ABNORMAL LOW (ref 3.5–5.0)
Alkaline Phosphatase: 13 U/L — ABNORMAL LOW (ref 38–126)
Bilirubin, Direct: 0.4 mg/dL — ABNORMAL HIGH (ref 0.0–0.2)
Indirect Bilirubin: 0.4 mg/dL (ref 0.3–0.9)
Total Bilirubin: 0.8 mg/dL (ref 0.3–1.2)
Total Protein: 6.8 g/dL (ref 6.5–8.1)

## 2019-09-17 LAB — IMMATURE PLATELET FRACTION: Immature Platelet Fraction: 3.4 % (ref 1.2–8.6)

## 2019-09-17 NOTE — Progress Notes (Addendum)
PROGRESS NOTE    Vanessa [name]  IOM:[id number] DOB: 1955/07/13 DOA: 09/14/2019 PCP: Maren Reamer, MD (Inactive)    Brief Narrative:  64 year old female with history of hypertension, type 2 diabetes and asthma presented to the emergency room with vaginal bleeding, intermittent for the last few months, progressive shortness of breath and weakness over the last 3 weeks.  Has not been on any medications or physician care for last 2 years due to no insurance.  Has developed itching all over the body as well as leg swelling. In the emergency room, hypertensive 220/95.  Hemoglobin 4.8, platelets 115, potassium 3.1, creatinine 1.9 and BUN 27.  Chest x-ray showed some cardiomegaly.  Received blood pressure medications and admitted to the hospital with transfusions.   Assessment & Plan:   Principal Problem:   Symptomatic anemia Active Problems:   DM type 2 (diabetes mellitus, type 2) (HCC)   HTN (hypertension)   Postmenopausal bleeding   Thrombocytopenia (HCC)   Generalized pruritus   Acute on chronic renal insufficiency   Hyperuricemia   Hypokalemia   Severe symptomatic anemia likely 2/2 ?abnormal vaginal bleeding in postmenopausal woman Vaginal bleeding is improved Hemoglobin 4.8 on admission, s/p 2 units of PRBC-->6.8, now 6.3, s/p 1U of PRBC on 09/16/19 CT scan with no evidence of abdominal disease, except for atrophic L kidney Vaginal ultrasound consistent with thickened endometrium OBGYN on board, rec Megace 80 mg BID, plan for outpatient pap smear and endometrial biopsy S/p 1 dose of feraheme on 09/15/19, continue oral iron supplement Daily CBC  HTN Stable Continue amlodipine, labetolol   Type 2 diabetes mellitus A1c is 5.7, this may reflect false low reading because of severe anemia Used to be on insulin Continue SSI, accuchecks, hypoglycemic protocol   ?Chronic kidney disease stage III Last Cr 1.2 in 2018, on presentation 1.9 Daily BMP  Thrombocytopenia Unclear  etiology, ?Related to malignancy, no heparin use CT abdomen/pelvis with normal liver, spleen DIC panel, not consistent with DIC, smear with no schistocytes, D-dimer elevated, fibrinogen elevated Heme-onc consulted Dr. Burr Medico on 09/17/2019, appreciate recs Daily CBC  Leukocytosis Unclear etiology, afebrile BC x2 pending UA/UC pending collection Chest x-ray unremarkable for any infection Daily CBC  Multiple lesions/wounds on bilateral lower extremity Unknown etiology, appears chronic, pruritic WOC consulted ??  May need biopsy to further evaluate Vs outpt dermatologist  Fall Patient reported falling out of her bed onto her left side, about a couple of weeks ago prior to arrival CT head pending X-ray of left shoulder, forearm pending PT/OT  Obesity Lifestyle modification advised     DVT prophylaxis: SCDs Code Status: Full code Family Communication: Discussed extensively with patient Disposition Plan: patient is from home. Anticipated DC to home, Barriers to discharge severe anemia requiring transfusions, consultant signing off, PT/OT   Consultants:   OB/GYN  Heme-onc  Procedures:   None  Antimicrobials:   None   Subjective: Patient seen and examined at bedside.  Still noted to have generalized fatigue, appears to be somewhat lethargic this morning with some mild confusion.  Reports significant pain to her left upper extremity, worse with movement.  Patient denies any chest pain, shortness of breath, abdominal pain, nausea/vomiting, fever/chills.    Objective: Vitals:   09/16/19 2039 09/17/19 0456 09/17/19 1019 09/17/19 1517  BP: (!) 143/65 140/79 (!) 167/76 130/63  Pulse: 76 80 84 75  Resp: 17 17  18   Temp: 99.2 F (37.3 C) 98.8 F (37.1 C)  98.6 F (37 C)  TempSrc: Oral  Oral  Oral  SpO2: 100% 98%  100%  Weight:      Height:        Intake/Output Summary (Last 24 hours) at 09/17/2019 1520 Last data filed at 09/17/2019 0900 Gross per 24 hour  Intake 360  ml  Output --  Net 360 ml   Filed Weights   09/15/19 0406  Weight: 76.1 kg    Examination:  General: NAD, lethargic, somewhat confused  Cardiovascular: S1, S2 present  Respiratory: CTAB  Abdomen: Soft, nontender, nondistended, bowel sounds present  Musculoskeletal: Multiple areas of scabs/wounds/lesions on BLE, chest, back, pruritic, trace bilateral pedal edema noted  Skin: Normal  Psychiatry: Normal mood   Data Reviewed: I have personally reviewed following labs and imaging studies  CBC: Recent Labs  Lab 09/14/19 1620 09/14/19 1620 09/14/19 1725 09/15/19 0616 09/16/19 0139 09/16/19 1612 09/17/19 0317 09/17/19 0843  WBC 6.8  --   --  6.7 6.5  --  15.1*  --   NEUTROABS 5.4  --   --   --  5.3  --  14.2*  --   HGB 4.8*   < > 4.6* 6.8* 6.3* 8.4* 7.6*  --   HCT 17.6*   < > 16.9* 22.1*  22.3* 20.4* 26.5* 24.7*  --   MCV 83.4  --   --  82.5 81.3  --  82.6  --   PLT 115*  --   --  66* 55*  --  48* 52*   < > = values in this interval not displayed.   Basic Metabolic Panel: Recent Labs  Lab 09/14/19 1620 09/15/19 0616 09/16/19 0139 09/17/19 0317  NA 140 140 142 144  K 3.1* 2.7* 3.5 4.1  CL 112* 109 113* 115*  CO2 18* 19* 19* 18*  GLUCOSE 113* 92 126* 137*  BUN 27* 23 27* 24*  CREATININE 1.90* 1.78* 2.02* 1.97*  CALCIUM 8.2* 8.3* 8.3* 8.7*  MG  --  2.1 2.2  --   PHOS  --  3.9 3.3  --    GFR: Estimated Creatinine Clearance: 25.9 mL/min (A) (by C-G formula based on SCr of 1.97 mg/dL (H)). Liver Function Tests: Recent Labs  Lab 09/14/19 1620 09/17/19 0317  AST 31 14*  ALT 19 15  ALKPHOS 80 13*  BILITOT 0.6 0.8  PROT 7.4 6.8  ALBUMIN 3.0* 2.6*   No results for input(s): LIPASE, AMYLASE in the last 168 hours. No results for input(s): AMMONIA in the last 168 hours. Coagulation Profile: Recent Labs  Lab 09/14/19 1725 09/17/19 0843  INR 1.1 1.1   Cardiac Enzymes: No results for input(s): CKTOTAL, CKMB, CKMBINDEX, TROPONINI in the last 168  hours. BNP (last 3 results) No results for input(s): PROBNP in the last 8760 hours. HbA1C: Recent Labs    09/15/19 0616  HGBA1C 5.7*   CBG: Recent Labs  Lab 09/16/19 1228 09/16/19 1714 09/16/19 2041 09/17/19 0741 09/17/19 1215  GLUCAP 124* 75 93 105* 168*   Lipid Profile: No results for input(s): CHOL, HDL, LDLCALC, TRIG, CHOLHDL, LDLDIRECT in the last 72 hours. Thyroid Function Tests: Recent Labs    09/15/19 0616  TSH 3.639   Anemia Panel: Recent Labs    09/14/19 1725  VITAMINB12 1,247*  FOLATE 6.9  FERRITIN 10*  TIBC 374  IRON 115  RETICCTPCT 1.7   Sepsis Labs: No results for input(s): PROCALCITON, LATICACIDVEN in the last 168 hours.  Recent Results (from the past 240 hour(s))  SARS CORONAVIRUS 2 (TAT 6-24 HRS) Nasopharyngeal  Nasopharyngeal Swab     Status: None   Collection Time: 09/14/19  5:25 PM   Specimen: Nasopharyngeal Swab  Result Value Ref Range Status   SARS Coronavirus 2 NEGATIVE NEGATIVE Final    Comment: (NOTE) SARS-CoV-2 target nucleic acids are NOT DETECTED. The SARS-CoV-2 RNA is generally detectable in upper and lower respiratory specimens during the acute phase of infection. Negative results do not preclude SARS-CoV-2 infection, do not rule out co-infections with other pathogens, and should not be used as the sole basis for treatment or other patient management decisions. Negative results must be combined with clinical observations, patient history, and epidemiological information. The expected result is Negative. Fact Sheet for Patients: SugarRoll.be Fact Sheet for Healthcare Providers: https://www.woods-mathews.com/ This test is not yet approved or cleared by the [place] FDA and  has been authorized for detection and/or diagnosis of SARS-CoV-2 by FDA under an Emergency Use Authorization (EUA). This EUA will remain  in effect (meaning this test can be used) for the duration of  the COVID-19 declaration under Section 56 4(b)(1) of the Act, 21 U.S.C. section 360bbb-3(b)(1), unless the authorization is terminated or revoked sooner. Performed at [facility], [place] [address], [place], [place] [address]          Radiology Studies: DG Chest Port 1 View  Result Date: 09/17/2019 CLINICAL DATA:  Weakness and leukocytosis. EXAM: PORTABLE CHEST 1 VIEW COMPARISON:  Chest x-ray 09/14/2019 FINDINGS: The heart is mildly enlarged but stable. Stable mild tortuosity of the thoracic aorta. Low lung volumes with vascular crowding and streaky basilar atelectasis. No infiltrates or effusions. No pulmonary lesions. The bony thorax is intact. Stable healing left humeral neck fracture. IMPRESSION: Low lung volumes with vascular crowding and streaky basilar atelectasis. Electronically Signed   By: Marijo Sanes M.D.   On: 09/17/2019 09:08   VAS [place] LOWER EXTREMITY VENOUS (DVT)  Result Date: 09/16/2019  Lower Venous DVTStudy Indications: Swelling, and Pain.  Comparison Study: no prior Performing Technologist: Abram Sander RVS  Examination Guidelines: A complete evaluation includes B-mode imaging, spectral Doppler, color Doppler, and power Doppler as needed of all accessible portions of each vessel. Bilateral testing is considered an integral part of a complete examination. Limited examinations for reoccurring indications may be performed as noted. The reflux portion of the exam is performed with the patient in reverse Trendelenburg.  +---------+---------------+---------+-----------+----------+--------------+ RIGHT    CompressibilityPhasicitySpontaneityPropertiesThrombus Aging +---------+---------------+---------+-----------+----------+--------------+ CFV      Full           Yes      Yes                                 +---------+---------------+---------+-----------+----------+--------------+ SFJ      Full                                                         +---------+---------------+---------+-----------+----------+--------------+ FV Prox  Full                                                        +---------+---------------+---------+-----------+----------+--------------+ FV Mid   Full                                                        +---------+---------------+---------+-----------+----------+--------------+  FV DistalFull                                                        +---------+---------------+---------+-----------+----------+--------------+ PFV      Full                                                        +---------+---------------+---------+-----------+----------+--------------+ POP      Full           Yes      Yes                                 +---------+---------------+---------+-----------+----------+--------------+ PTV      Full                                                        +---------+---------------+---------+-----------+----------+--------------+ PERO                                                  Not visualized +---------+---------------+---------+-----------+----------+--------------+   +---------+---------------+---------+-----------+----------+--------------+ LEFT     CompressibilityPhasicitySpontaneityPropertiesThrombus Aging +---------+---------------+---------+-----------+----------+--------------+ CFV      Full           Yes      Yes                                 +---------+---------------+---------+-----------+----------+--------------+ SFJ      Full                                                        +---------+---------------+---------+-----------+----------+--------------+ FV Prox  Full                                                        +---------+---------------+---------+-----------+----------+--------------+ FV Mid   Full                                                         +---------+---------------+---------+-----------+----------+--------------+ FV DistalFull                                                        +---------+---------------+---------+-----------+----------+--------------+  PFV      Full                                                        +---------+---------------+---------+-----------+----------+--------------+ POP      Full           Yes      Yes                                 +---------+---------------+---------+-----------+----------+--------------+ PTV                                                   Not visualized +---------+---------------+---------+-----------+----------+--------------+ PERO                                                  Not visualized +---------+---------------+---------+-----------+----------+--------------+     Summary: RIGHT: - There is no evidence of deep vein thrombosis in the lower extremity.  - No cystic structure found in the popliteal fossa.  LEFT: - There is no evidence of deep vein thrombosis in the lower extremity. However, portions of this examination were limited- see technologist comments above.  - No cystic structure found in the popliteal fossa.  *See table(s) above for measurements and observations. Electronically signed by Ruta Hinds MD on 09/16/2019 at 3:22:59 PM.    Final         Scheduled Meds: . amLODipine  10 mg Oral Daily  . atorvastatin  40 mg Oral Daily  . ferrous sulfate  325 mg Oral BID WC  . influenza vaccine adjuvanted  0.5 mL Intramuscular Tomorrow-1000  . insulin aspart  0-15 Units Subcutaneous TID WC  . insulin aspart  0-5 Units Subcutaneous QHS  . labetalol  200 mg Oral TID  . megestrol  80 mg Oral BID  . pneumococcal 23 valent vaccine  0.5 mL Intramuscular Tomorrow-1000  . potassium chloride  40 mEq Oral Daily   Continuous Infusions: . sodium chloride       LOS: 3 days      Alma Friendly, MD [facility]

## 2019-09-17 NOTE — Consult Note (Addendum)
[place]  [phone]) [phone] Fax:([phone]) [place]  Referring MD:  Dr. Lesia Sago  Reason for Referral: Thrombocytopenia  HPI: Vanessa [name] is a 64 year old female with a past medical history significant for hypertension, diabetes type 2, and asthma.  She presented to the emergency room after not seeing her physician for multiple years due to loss of insurance with vaginal bleeding and itching.  She reported that her vaginal bleeding had been ongoing for several months.  The patient is postmenopausal.  She also developed severe itching and progressive shortness of breath in the 2 to 3 weeks leading up to admission.  In the emergency room, she was found to be hypertensive with a blood pressure 220/95.  Her hemoglobin was 4.8, platelet count 115,000, potassium 3.1, BUN 27, creatinine 1.9.  Her iron level was 115 and ferritin was 10.  On admission, she had an abdominal ultrasound which showed endometrium that was poorly defined but appears thickened and endometrial sampling was recommended to exclude carcinoma.  She also had a CT of the abdomen pelvis without contrast which showed suboptimal evaluation of the uterus due to lack of IV contrast, anemia, small right pleural effusion, trace abdominal ascites, and atrophic left kidney.  The patient has received 3 units of PRBCs to date and received Feraheme 510 mg IV x1 dose on 09/15/2019.  Labs from earlier today showed a WBC of 15.1, hemoglobin 7.6, platelet count of 48,000.  She has been seen by gynecology who started her on Megace.  Today, the patient reports that her vaginal bleeding has stopped.  She stated that she had very heavy bleeding prior to admission and had to change her pad every few hours.  She was unable to tell me how long she had been bleeding for or have a day she had very heavy bleeding for.  She continues to have ongoing pruritus and noted to have scabs on her chest and that her  bilateral legs are wrapped.  She reported that she had shortness of breath and chest discomfort prior to admission but this has improved.  She has not had any recent fevers or chills.  Denies headaches and dizziness.  Denies abdominal pain, nausea, vomiting, constipation, diarrhea.  She has not noticed any other bleeding other than heavy periods.  The patient is married and has 3 children.  Denies history of alcohol and tobacco use.  Hematology was asked see the patient for recommendations regarding her thrombocytopenia.  Past Medical History:  Diagnosis Date  . Asthma   . Diabetes mellitus without complication ([place])   . Heart murmur   . Hypertension   :    Past Surgical History:  Procedure Laterality Date  . TYMPANOSTOMY TUBE PLACEMENT Bilateral   :   CURRENT MEDS: Current Facility-Administered Medications  Medication Dose Route Frequency Provider Last Rate Last Admin  . 0.9 %  sodium chloride infusion  10 mL/hr Intravenous Once Bennie Pierini, MD      . acetaminophen (TYLENOL) tablet 650 mg  650 mg Oral Q6H PRN Bennie Pierini, MD   650 mg at 09/17/19 1044   Or  . acetaminophen (TYLENOL) suppository 650 mg  650 mg Rectal Q6H PRN Bennie Pierini, MD      . amLODipine (NORVASC) tablet 10 mg  10 mg Oral Daily Bennie Pierini, MD   10 mg at 09/17/19 1019  . atorvastatin (LIPITOR) tablet 40 mg  40 mg Oral Daily Schertz, Michele Mcalpine,  MD   40 mg at 09/17/19 1019  . diphenhydrAMINE-zinc acetate (BENADRYL) 2-0.1 % cream   Topical TID PRN Bennie Pierini, MD      . ferrous sulfate tablet 325 mg  325 mg Oral BID WC Barb Merino, MD   325 mg at 09/17/19 1019  . hydrOXYzine (ATARAX/VISTARIL) tablet 25 mg  25 mg Oral Q6H PRN Barb Merino, MD   25 mg at 09/16/19 1024  . influenza vaccine adjuvanted (FLUAD) injection 0.5 mL  0.5 mL Intramuscular Tomorrow-1000 Schertz, Michele Mcalpine, MD      . insulin aspart (novoLOG) injection 0-15 Units  0-15 Units Subcutaneous TID WC Bennie Pierini, MD   3 Units at 09/17/19 1356  . insulin aspart (novoLOG) injection 0-5 Units  0-5 Units Subcutaneous QHS Bennie Pierini, MD      . labetalol (NORMODYNE) tablet 200 mg  200 mg Oral TID Bennie Pierini, MD   200 mg at 09/17/19 1019  . megestrol (MEGACE) tablet 80 mg  80 mg Oral BID Donnamae Jude, MD   80 mg at 09/17/19 1025  . ondansetron (ZOFRAN) tablet 4 mg  4 mg Oral Q6H PRN Bennie Pierini, MD       Or  . ondansetron [facility]) injection 4 mg  4 mg Intravenous Q6H PRN Bennie Pierini, MD      . oxyCODONE (Oxy IR/ROXICODONE) immediate release tablet 5 mg  5 mg Oral Q4H PRN Bennie Pierini, MD   5 mg at 09/16/19 1024  . pneumococcal 23 valent vaccine (PNEUMOVAX-23) injection 0.5 mL  0.5 mL Intramuscular Tomorrow-1000 Schertz, Michele Mcalpine, MD      . potassium chloride SA (KLOR-CON) CR tablet 40 mEq  40 mEq Oral Daily Alma Friendly, MD   40 mEq at 09/17/19 1019      No Known Allergies:  Family History  Problem Relation Age of Onset  . Stroke Mother   . Diabetes Mother   . Hypertension Mother   . Diabetes Brother   . Hypertension Brother   :  Social History   Socioeconomic History  . Marital status: Married    Spouse name: Not on file  . Number of children: Not on file  . Years of education: Not on file  . Highest education level: Not on file  Occupational History  . Not on file  Tobacco Use  . Smoking status: Never Smoker  . Smokeless tobacco: Never Used  Substance and Sexual Activity  . Alcohol use: No  . Drug use: No  . Sexual activity: Never  Other Topics Concern  . Not on file  Social History Narrative  . Not on file   Social Determinants of Health   Financial Resource Strain:   . Difficulty of Paying Living Expenses: Not on file  Food Insecurity:   . Worried About Charity fundraiser in the Last Year: Not on file  . Ran Out of Food in the Last Year: Not on file  Transportation Needs:   . Lack of Transportation (Medical): Not on file   . Lack of Transportation (Non-Medical): Not on file  Physical Activity:   . Days of Exercise per Week: Not on file  . Minutes of Exercise per Session: Not on file  Stress:   . Feeling of Stress : Not on file  Social Connections:   . Frequency of Communication with Friends and Family: Not on file  . Frequency of Social Gatherings with Friends and Family: Not  on file  . Attends Religious Services: Not on file  . Active Member of Clubs or Organizations: Not on file  . Attends Archivist Meetings: Not on file  . Marital Status: Not on file  Intimate Partner Violence:   . Fear of Current or Ex-Partner: Not on file  . Emotionally Abused: Not on file  . Physically Abused: Not on file  . Sexually Abused: Not on file  :  REVIEW OF SYSTEMS: A comprehensive 14 point systems was negative except as noted in the HPI.  Exam: Patient Vitals for the past 24 hrs:  BP Temp Temp src Pulse Resp SpO2  09/17/19 1019 (!) 167/76 -- -- 84 -- --  09/17/19 0456 140/79 98.8 F (37.1 C) Oral 80 17 98 %  09/16/19 2039 (!) 143/65 99.2 F (37.3 C) Oral 76 17 100 %    General:  well-nourished in no acute distress.   Eyes:  no scleral icterus.   ENT:  There were no oropharyngeal lesions.   Lymphatics:  Negative cervical, supraclavicular or axillary adenopathy.  Respiratory: lungs were clear bilaterally without wheezing or crackles.  Cardiovascular:  Regular rate and rhythm, S1/S2, without murmur, rub or gallop. There was no pedal edema.   GI:  abdomen was soft, flat, nontender, nondistended, without organomegaly.  Musculoskeletal:  no spinal tenderness of palpation of vertebral spine.   Skin exam: Multiple scabs noted on her chest, bilateral legs are wrapped Neuro exam was nonfocal.  The patient appeared to be confused and was at time picking at her skin and attempting to take her hospital gown off.    LABS:  Lab Results  Component Value Date   WBC 15.1 (H) 09/17/2019   HGB 7.6 (L) 09/17/2019    HCT 24.7 (L) 09/17/2019   PLT 52 (L) 09/17/2019   GLUCOSE 137 (H) 09/17/2019   CHOL 153 08/10/2015   TRIG 82 08/10/2015   HDL 61 08/10/2015   LDLCALC 76 08/10/2015   ALT 15 09/17/2019   AST 14 (L) 09/17/2019   NA 144 09/17/2019   K 4.1 09/17/2019   CL 115 (H) 09/17/2019   CREATININE 1.97 (H) 09/17/2019   BUN 24 (H) 09/17/2019   CO2 18 (L) 09/17/2019   INR 1.1 09/17/2019   HGBA1C 5.7 (H) 09/15/2019   MICROALBUR 12.5 03/24/2016    CT ABDOMEN PELVIS WO CONTRAST  Result Date: 09/15/2019 CLINICAL DATA:  Nausea and vomiting.  Concern for pelvic malignancy. EXAM: CT ABDOMEN AND PELVIS WITHOUT CONTRAST TECHNIQUE: Multidetector CT imaging of the abdomen and pelvis was performed following the standard protocol without IV contrast. COMPARISON:  September 14, 2019 FINDINGS: Lower chest: There is a small right-sided pleural effusion.The heart size is mildly enlarged. The intracardiac blood pool is hypodense relative to the adjacent myocardium consistent with anemia. Hepatobiliary: The liver is normal. Normal gallbladder.There is no biliary ductal dilation. Pancreas: Normal contours without ductal dilatation. No peripancreatic fluid collection. Spleen: No splenic laceration or hematoma. Adrenals/Urinary Tract: --Adrenal glands: No adrenal hemorrhage. --Right kidney/ureter: No hydronephrosis or perinephric hematoma. --Left kidney/ureter: The left kidney is atrophic. This may be secondary to a distal left ureteral stone measuring approximately 7 mm (axial series 3, image 58), however the ureter at this level is difficult to follow. --Urinary bladder: Unremarkable. Stomach/Bowel: --Stomach/Duodenum: No hiatal hernia or other gastric abnormality. Normal duodenal course and caliber. --Small bowel: No dilatation or inflammation. --Colon: No focal abnormality. --Appendix: Normal. Vascular/Lymphatic: Normal course and caliber of the major abdominal vessels. --No retroperitoneal lymphadenopathy. --  No mesenteric  lymphadenopathy. --No pelvic or inguinal lymphadenopathy. Reproductive: The uterus is not well evaluated in the absence of IV contrast. Other: There is a trace volume of ascites. The abdominal wall is normal. Musculoskeletal. No acute displaced fractures. IMPRESSION: 1. Suboptimal evaluation of the uterus in the absence of IV contrast. See separate recent ultrasound for further evaluation and recommendations. 2. Anemia. 3. Small right-sided pleural effusion. 4. Trace abdominal ascites. 5. Atrophic left kidney which may be secondary to a distal left ureteral stone measuring approximately 7 mm. The left kidney is likely nonfunctional as there is no left-sided hydronephrosis. Electronically Signed   By: Constance Holster M.D.   On: 09/15/2019 02:16   US PELVIS (TRANSABDOMINAL ONLY)  Result Date: 09/14/2019 CLINICAL DATA:  Postmenopausal vaginal bleeding. EXAM: TRANSABDOMINAL ULTRASOUND OF PELVIS TECHNIQUE: Transabdominal ultrasound examination of the pelvis was performed including evaluation of the uterus, ovaries, adnexal regions, and pelvic cul-de-sac. Attempt was made at transvaginal exam, however patient did not tolerate transvaginal probe. COMPARISON:  None. FINDINGS: Uterus Measurements: 10.0 x 5.0 x 6.4 cm = volume: 164 mL. No fibroids or other mass visualized. Endometrium Poorly defined. Endometrium appears mildly thickened, tentatively measured at 8 mm. Right ovary Not visualized.  No adnexal mass. Left ovary Not visualized.  No adnexal mass. Other findings:  No abnormal free fluid. IMPRESSION: Endometrium is poorly defined, however appears thickened measuring approximately 8-9 mm. In the setting of post-menopausal bleeding, endometrial sampling is indicated to exclude carcinoma. If results are benign, sonohysterogram should be considered for focal lesion work-up. (Ref: Radiological Reasoning: Algorithmic Workup of Abnormal Vaginal Bleeding with Endovaginal Sonography and Sonohysterography. AJR 2008;  [id number]) Electronically Signed   By: Keith Rake M.D.   On: 09/14/2019 23:23   DG Chest Port 1 View  Result Date: 09/17/2019 CLINICAL DATA:  Weakness and leukocytosis. EXAM: PORTABLE CHEST 1 VIEW COMPARISON:  Chest x-ray 09/14/2019 FINDINGS: The heart is mildly enlarged but stable. Stable mild tortuosity of the thoracic aorta. Low lung volumes with vascular crowding and streaky basilar atelectasis. No infiltrates or effusions. No pulmonary lesions. The bony thorax is intact. Stable healing left humeral neck fracture. IMPRESSION: Low lung volumes with vascular crowding and streaky basilar atelectasis. Electronically Signed   By: Marijo Sanes M.D.   On: 09/17/2019 09:08   DG Chest Port 1 View  Result Date: 09/14/2019 CLINICAL DATA:  Shortness of breath. EXAM: PORTABLE CHEST 1 VIEW COMPARISON:  03/06/2014 FINDINGS: Enlarged cardiac silhouette with an interval significant increase in size. Clear lungs. Interval prominence of the pulmonary vasculature in the upper lung zones. No pleural fluid. Thoracic spine degenerative changes. Interval probable old, healed left humeral neck fracture. Mild right shoulder degenerative changes. IMPRESSION: Interval cardiomegaly and pulmonary vascular congestion in the upper lung zones. Electronically Signed   By: Claudie Revering M.D.   On: 09/14/2019 16:58   VAS [place] LOWER EXTREMITY VENOUS (DVT)  Result Date: 09/16/2019  Lower Venous DVTStudy Indications: Swelling, and Pain.  Comparison Study: no prior Performing Technologist: Abram Sander RVS  Examination Guidelines: A complete evaluation includes B-mode imaging, spectral Doppler, color Doppler, and power Doppler as needed of all accessible portions of each vessel. Bilateral testing is considered an integral part of a complete examination. Limited examinations for reoccurring indications may be performed as noted. The reflux portion of the exam is performed with the patient in reverse Trendelenburg.   +---------+---------------+---------+-----------+----------+--------------+ RIGHT    CompressibilityPhasicitySpontaneityPropertiesThrombus Aging +---------+---------------+---------+-----------+----------+--------------+ CFV      Full  Yes      Yes                                 +---------+---------------+---------+-----------+----------+--------------+ SFJ      Full                                                        +---------+---------------+---------+-----------+----------+--------------+ FV Prox  Full                                                        +---------+---------------+---------+-----------+----------+--------------+ FV Mid   Full                                                        +---------+---------------+---------+-----------+----------+--------------+ FV DistalFull                                                        +---------+---------------+---------+-----------+----------+--------------+ PFV      Full                                                        +---------+---------------+---------+-----------+----------+--------------+ POP      Full           Yes      Yes                                 +---------+---------------+---------+-----------+----------+--------------+ PTV      Full                                                        +---------+---------------+---------+-----------+----------+--------------+ PERO                                                  Not visualized +---------+---------------+---------+-----------+----------+--------------+   +---------+---------------+---------+-----------+----------+--------------+ LEFT     CompressibilityPhasicitySpontaneityPropertiesThrombus Aging +---------+---------------+---------+-----------+----------+--------------+ CFV      Full           Yes      Yes                                  +---------+---------------+---------+-----------+----------+--------------+ SFJ      Full                                                        +---------+---------------+---------+-----------+----------+--------------+  FV Prox  Full                                                        +---------+---------------+---------+-----------+----------+--------------+ FV Mid   Full                                                        +---------+---------------+---------+-----------+----------+--------------+ FV DistalFull                                                        +---------+---------------+---------+-----------+----------+--------------+ PFV      Full                                                        +---------+---------------+---------+-----------+----------+--------------+ POP      Full           Yes      Yes                                 +---------+---------------+---------+-----------+----------+--------------+ PTV                                                   Not visualized +---------+---------------+---------+-----------+----------+--------------+ PERO                                                  Not visualized +---------+---------------+---------+-----------+----------+--------------+     Summary: RIGHT: - There is no evidence of deep vein thrombosis in the lower extremity.  - No cystic structure found in the popliteal fossa.  LEFT: - There is no evidence of deep vein thrombosis in the lower extremity. However, portions of this examination were limited- see technologist comments above.  - No cystic structure found in the popliteal fossa.  *See table(s) above for measurements and observations. Electronically signed by Ruta Hinds MD on 09/16/2019 at 3:22:59 PM.    Final      ASSESSMENT AND PLAN:  1.  Thrombocytopenia 2.  Anemia secondary to postmenopausal bleeding 3.  Hypertension 4.  Type 2 diabetes 5.  CKD 6.   Leukocytosis 7.  Multiple lesions/wounds on her bilateral lower extremities  -The patient has had progressive thrombocytopenia during this hospitalization.  Suspect thrombocytopenia is due to heavy vaginal bleeding.  Will review peripheral blood smear and add on immature platelet fraction.  Would recommend platelet transfusion for platelets less than 10,000 or active bleeding.  If her platelets do not start to recover, may need to consider a bone marrow biopsy. -The patient  has anemia secondary to iron deficiency from postmenopausal bleeding. Bleeding has now stopped.  She is status post IV iron and now taking oral iron.  We can repeat a dose of Feraheme 510 mg IV 1 week following her prior dose.  Recommend close monitoring of her hemoglobin and transfuse for hemoglobin less than 7. -Continue ongoing management per hospitalist of her other chronic medical conditions.  Thank you for this referral.  Mikey Bussing, DNP, AGPCNP-BC, AOCNP  Addendum  I have seen the patient, examined her. I agree with the assessment and and plan and have edited the notes.   64 yo female with PMH of diabetes, hypertension, and asthma, was admitted for vaginal bleeding for 2 to 3 months.  She was found to have severe anemia, and worsening thrombocytopenia.  Patient has been seen by GYN, pelvic ultrasound showed thickening of endometrium, CTAP showed no evidence malignancy or metastasis.  She has been seen by GYN, endometrial biopsy is planned to be done as outpatient, which I think should be done as soon as possible.  I have reviewed her peripheral blood smear, which showed occasional schistocytes, lab tests support iv iron, no hemolysis. DIC panel showed elevated fibrinogen and D-dimer.  I think her thrombocytopenia could be related to her heavy bleeding, and possible DIC. However her immature platelet fraction is on low end normal, which is against  ITP and suggest inadequate marrow response. I think primary bone marrow  disease is not ruled out. Pt had new leukocytosis this morning, blood culture was drawn, results still pending.  I plan to discuss with GYN to see if they can do biopsy sooner, and watch her CBC and culture result, and decide if we will proceed with bone marrow biopsy in the next few days. Will also check SPEP, light chain and UPEP. Will f/u.  Truitt Merle  09/17/2019

## 2019-09-18 ENCOUNTER — Inpatient Hospital Stay (HOSPITAL_COMMUNITY): Payer: Medicare Other

## 2019-09-18 DIAGNOSIS — R7881 Bacteremia: Secondary | ICD-10-CM

## 2019-09-18 DIAGNOSIS — I340 Nonrheumatic mitral (valve) insufficiency: Secondary | ICD-10-CM

## 2019-09-18 DIAGNOSIS — B9561 Methicillin susceptible Staphylococcus aureus infection as the cause of diseases classified elsewhere: Secondary | ICD-10-CM

## 2019-09-18 DIAGNOSIS — E1121 Type 2 diabetes mellitus with diabetic nephropathy: Secondary | ICD-10-CM

## 2019-09-18 DIAGNOSIS — I361 Nonrheumatic tricuspid (valve) insufficiency: Secondary | ICD-10-CM

## 2019-09-18 DIAGNOSIS — N1832 Chronic kidney disease, stage 3b: Secondary | ICD-10-CM

## 2019-09-18 LAB — BPAM RBC
Blood Product Expiration Date: 202104072359
Blood Product Expiration Date: 202104072359
ISSUE DATE / TIME: 202103061952
Unit Type and Rh: 5100
Unit Type and Rh: 5100

## 2019-09-18 LAB — CBC WITH DIFFERENTIAL/PLATELET
Abs Immature Granulocytes: 0.17 10*3/uL — ABNORMAL HIGH (ref 0.00–0.07)
Basophils Absolute: 0 10*3/uL (ref 0.0–0.1)
Basophils Relative: 0 %
Eosinophils Absolute: 0 10*3/uL (ref 0.0–0.5)
Eosinophils Relative: 0 %
HCT: 23.8 % — ABNORMAL LOW (ref 36.0–46.0)
Hemoglobin: 7.4 g/dL — ABNORMAL LOW (ref 12.0–15.0)
Immature Granulocytes: 1 %
Lymphocytes Relative: 2 %
Lymphs Abs: 0.3 10*3/uL — ABNORMAL LOW (ref 0.7–4.0)
MCH: 26 pg (ref 26.0–34.0)
MCHC: 31.1 g/dL (ref 30.0–36.0)
MCV: 83.5 fL (ref 80.0–100.0)
Monocytes Absolute: 0.4 10*3/uL (ref 0.1–1.0)
Monocytes Relative: 3 %
Neutro Abs: 13.9 10*3/uL — ABNORMAL HIGH (ref 1.7–7.7)
Neutrophils Relative %: 94 %
Platelets: 55 10*3/uL — ABNORMAL LOW (ref 150–400)
RBC: 2.85 MIL/uL — ABNORMAL LOW (ref 3.87–5.11)
RDW: 20.1 % — ABNORMAL HIGH (ref 11.5–15.5)
WBC: 14.8 10*3/uL — ABNORMAL HIGH (ref 4.0–10.5)
nRBC: 0.6 % — ABNORMAL HIGH (ref 0.0–0.2)

## 2019-09-18 LAB — BLOOD CULTURE ID PANEL (REFLEXED)

## 2019-09-18 LAB — GLUCOSE, CAPILLARY
Glucose-Capillary: 115 mg/dL — ABNORMAL HIGH (ref 70–99)
Glucose-Capillary: 140 mg/dL — ABNORMAL HIGH (ref 70–99)
Glucose-Capillary: 135 mg/dL — ABNORMAL HIGH (ref 70–99)

## 2019-09-18 LAB — URINALYSIS, ROUTINE W REFLEX MICROSCOPIC
Bilirubin Urine: NEGATIVE
Glucose, UA: NEGATIVE mg/dL
Ketones, ur: NEGATIVE mg/dL
Nitrite: NEGATIVE
Protein, ur: 100 mg/dL — AB
Specific Gravity, Urine: 1.019 (ref 1.005–1.030)
pH: 5 (ref 5.0–8.0)

## 2019-09-18 LAB — TYPE AND SCREEN
ABO/RH(D): O POS
Antibody Screen: NEGATIVE
Unit division: 0
Unit division: 0

## 2019-09-18 LAB — BASIC METABOLIC PANEL
Anion gap: 9 (ref 5–15)
BUN: 30 mg/dL — ABNORMAL HIGH (ref 8–23)
CO2: 18 mmol/L — ABNORMAL LOW (ref 22–32)
Calcium: 8.8 mg/dL — ABNORMAL LOW (ref 8.9–10.3)
Chloride: 115 mmol/L — ABNORMAL HIGH (ref 98–111)
Creatinine, Ser: 2.12 mg/dL — ABNORMAL HIGH (ref 0.44–1.00)
GFR calc Af Amer: 28 mL/min — ABNORMAL LOW (ref >60)
GFR calc non Af Amer: 24 mL/min — ABNORMAL LOW (ref >60)
Glucose, Bld: 154 mg/dL — ABNORMAL HIGH (ref 70–99)
Potassium: 4.4 mmol/L (ref 3.5–5.1)
Sodium: 142 mmol/L (ref 135–145)

## 2019-09-18 LAB — ECHOCARDIOGRAM COMPLETE
Height: 60 in
Weight: 2656.1 oz

## 2019-09-18 MED ORDER — CEFAZOLIN SODIUM-DEXTROSE 2-4 GM/100ML-% IV SOLN
2.0000 g | Freq: Two times a day (BID) | INTRAVENOUS | Status: DC
  Administered 2019-09-18 – 2019-09-25 (×15): 2 g via INTRAVENOUS
  Filled 2019-09-18 (×16): qty 100

## 2019-09-18 MED ORDER — DICLOFENAC SODIUM 1 % EX GEL
2.0000 g | Freq: Four times a day (QID) | CUTANEOUS | Status: DC
  Administered 2019-09-18 – 2019-09-25 (×28): 2 g via TOPICAL
  Filled 2019-09-18: qty 100

## 2019-09-18 MED ORDER — CEFAZOLIN SODIUM-DEXTROSE 2-4 GM/100ML-% IV SOLN
2.0000 g | Freq: Once | INTRAVENOUS | Status: AC
  Administered 2019-09-18: 2 g via INTRAVENOUS
  Filled 2019-09-18: qty 100

## 2019-09-18 NOTE — Evaluation (Signed)
Physical Therapy Evaluation Patient Details Name: Vanessa [name] MRN: [id number] DOB: 01/31/1956 Today's Date: 09/18/2019   History of Present Illness  Pt isa 64 y/o female with PMH of HTN, type 2 DM, asthma presenting to ED with vaginal bleeding, progressive SOB and weakness over the last 3 weeks. Admitted with severe symptomatic anemia. Found with thrombocytopenia. CT negative, Found with L proximal humeral fracture healing (not acute) per xray. Pending recommendations.    Clinical Impression  Pt admitted with/for progressive weakness, SOB, bleeding described above.  Pt was initially critically anemic which has improved with blood, but pt still remains significantly weak and needs moderate to maximal assist..  Pt currently limited functionally due to the problems listed. ( See problems list.)   Pt will benefit from PT to maximize function and safety in order to get ready for next venue listed below.     Follow Up Recommendations SNF;Supervision/Assistance - 24 hour    Equipment Recommendations  Other (comment)(TBA)    Recommendations for Other Services       Precautions / Restrictions Precautions Precautions: Fall Precaution Comments: L proximal humerus fx healing (not acute)- pending MD recommendations  Restrictions Other Position/Activity Restrictions: L shoulder pain with xray finding proximal humerus fx healing (not acute)- maintained NWB until MD orders clarifed       Mobility  Bed Mobility Overal bed mobility: Needs Assistance Bed Mobility: Rolling;Sidelying to Sit Rolling: Max assist Sidelying to sit: Max assist       General bed mobility comments: max assist to roll towards R side, decreased functional use of L UE and weakness; utilized pad to roll  Transfers Overall transfer level: Needs assistance   Transfers: Sit to/from Stand Sit to Stand: Mod assist;From elevated surface         General transfer comment: face to face assist to come forward and up.   Supported L UE thoughout.  Ambulation/Gait             General Gait Details: NT today,  Marched in place with min assist at [facility] Mobility    Modified Rankin (Stroke Patients Only)       Balance Overall balance assessment: Needs assistance   Sitting balance-Leahy Scale: Fair Sitting balance - Comments: pt could sit with both hands in her lap, but preferred to rest propped on her R elbow on the rail.   Standing balance support: Single extremity supported;Bilateral upper extremity supported Standing balance-Leahy Scale: Poor Standing balance comment: stood upright and able to w/shift and march in place with minimal exteral support.                             Pertinent Vitals/Pain Pain Assessment: Faces Faces Pain Scale: Hurts even more Pain Location: L UE (shoulder) Pain Descriptors / Indicators: Discomfort;Guarding Pain Intervention(s): Limited activity within patient's tolerance;Monitored during session;Premedicated before session    Home Living Family/patient expects to be discharged to:: Private residence Living Arrangements: Spouse/significant other Available Help at Discharge: Family;Available PRN/intermittently(spouse works) Type of Home: House Home Access: Level entry     Home Layout: One level Home Equipment: [place] - single point;Bedside commode Additional Comments: pt limited historian     Prior Function Level of Independence: Independent         Comments: reports independent with ADLs, mobility but limited historian (initally reporting needing assist for ADLs since fall, but then reporting  she was managing independently)      Hand Dominance   Dominant Hand: Right    Extremity/Trunk Assessment   Upper Extremity Assessment Upper Extremity Assessment: Defer to OT evaluation    Lower Extremity Assessment Lower Extremity Assessment: Generalized weakness(L marginally weaker than r LE)        Communication   Communication: No difficulties  Cognition Arousal/Alertness: Awake/alert Behavior During Therapy: Flat affect Overall Cognitive Status: No family/caregiver present to determine baseline cognitive functioning Area of Impairment: Orientation;Attention;Memory;Following commands;Safety/judgement;Awareness;Problem solving                 Orientation Level: Disoriented to;Situation;Time Current Attention Level: Sustained Memory: Decreased short-term memory Following Commands: Follows one step commands consistently;Follows one step commands with increased time Safety/Judgement: Decreased awareness of safety;Decreased awareness of deficits Awareness: Intellectual Problem Solving: Slow processing;Decreased initiation;Difficulty sequencing;Requires verbal cues        General Comments      Exercises     Assessment/Plan    PT Assessment Patient needs continued PT services  PT Problem List Decreased strength;Decreased activity tolerance;Decreased mobility;Decreased knowledge of use of DME;Pain;Decreased balance       PT Treatment Interventions Gait training;Functional mobility training;Therapeutic activities;Balance training;Patient/family education;DME instruction    PT Goals (Current goals can be found in the Care Plan section)  Acute Rehab PT Goals Patient Stated Goal: less pain: PT Goal Formulation: With patient Time For Goal Achievement: 10/02/19 Potential to Achieve Goals: Fair    Frequency Min 3X/week   Barriers to discharge        Co-evaluation               AM-PAC PT "6 Clicks" Mobility  Outcome Measure Help needed turning from your back to your side while in a flat bed without using bedrails?: Total Help needed moving from lying on your back to sitting on the side of a flat bed without using bedrails?: A Lot Help needed moving to and from a bed to a chair (including a wheelchair)?: A Lot Help needed standing up from a chair using your  arms (e.g., wheelchair or bedside chair)?: A Lot Help needed to walk in hospital room?: A Lot Help needed climbing 3-5 steps with a railing? : Total 6 Click Score: 10    End of Session   Activity Tolerance: Patient limited by fatigue;Patient tolerated treatment well Patient left: in bed;with call bell/phone within reach;with bed alarm set;Other (comment)(sitting EOB) Nurse Communication: Mobility status PT Visit Diagnosis: Unsteadiness on feet (R26.81);Muscle weakness (generalized) (M62.81);Difficulty in walking, not elsewhere classified (R26.2);Pain Pain - Right/Left: Left Pain - part of body: Arm    Time: 1640-1700 PT Time Calculation (min) (ACUTE ONLY): 20 min   Charges:   PT Evaluation $PT Eval Moderate Complexity: 1 Mod          09/18/2019  Ginger Carne., PT Acute Rehabilitation Services [phone]  (pager) [phone]  (office)  Tessie Fass Lynsi Dooner 09/18/2019, 5:52 PM

## 2019-09-18 NOTE — Progress Notes (Signed)
  Echocardiogram 2D Echocardiogram has been performed.  Vanessa [name] 09/18/2019, 5:03 PM

## 2019-09-18 NOTE — Evaluation (Signed)
Occupational Therapy Evaluation Patient Details Name: Vanessa [name] MRN: [id number] DOB: 03/03/1956 Today's Date: 09/18/2019    History of Present Illness Pt isa 64 y/o female with PMH of HTN, type 2 DM, asthma presenting to ED with vaginal bleeding, progressive SOB and weakness over the last 3 weeks. Admitted with severe symptomatic anemia. Found with thrombocytopenia. CT negative, Found with L proximal humeral fracture healing (not acute) per xray. Pending recommendations.     Clinical Impression   Patient admitted for above and limited by problem list below, including impaired cognition, generalized weakness, L UE pain and decreased functional use, and decreased activity tolerance.  Patient oriented to self, place, disoriented to month initially but able to voice correct month/year at completion of session, follows simple commands with increased time, but poor STM, decreased attention, awareness to deficits, and safety.  Session limited to bed level due to L proximal humerus fx healing with no MD recommendations at this time; repositioned UE on pillow for pain mgmt and positioning.  Patient requires min-total assist for ADLs, rolling with max assist. Patient reports living with her spouse who works during the day, limited historian as initially reports independence with ADLs/mobility but then reports needing assist since fall (which history was unclear as well).  Patient will benefit from continued OT services while admitted and after dc at SNF level to optimize independence with ADLs, mobility progression when appropriate.      Follow Up Recommendations  SNF;Supervision/Assistance - 24 hour(pending progress)    Equipment Recommendations  3 in 1 bedside commode    Recommendations for Other Services PT consult     Precautions / Restrictions Precautions Precautions: Fall Precaution Comments: L proximal humerus fx healing (not acute)- pending MD recommendations  Restrictions Other  Position/Activity Restrictions: L shoulder pain with xray finding proximal humerus fx healing (not acute)- maintained NWB until MD orders clarifed       Mobility Bed Mobility Overal bed mobility: Needs Assistance Bed Mobility: Rolling Rolling: Max assist         General bed mobility comments: max assist to roll towards R side, decreased functional use of L UE and weakness; utilized pad to roll  Transfers                 General transfer comment: deferred     Balance                                           ADL either performed or assessed with clinical judgement   ADL Overall ADL's : Needs assistance/impaired     Grooming: Wash/dry hands;Wash/dry face;Minimal assistance;Bed level   Upper Body Bathing: Maximal assistance;Bed level   Lower Body Bathing: Total assistance;+2 for physical assistance;Bed level   Upper Body Dressing : Maximal assistance;Bed level   Lower Body Dressing: Total assistance;+2 for physical assistance;Bed level       Toileting- Clothing Manipulation and Hygiene: Total assistance;+2 for physical assistance;Bed level Toileting - Clothing Manipulation Details (indicate cue type and reason): rolling towards R side for hygiene +2 due to incontient bowels      Functional mobility during ADLs: Maximal assistance(rolling only) General ADL Comments: pt limited by L shoulder pain, impaired cognitoin and weakness      Vision Baseline Vision/History: Wears glasses Wears Glasses: At all times Vision Assessment?: No apparent visual deficits     Perception     Praxis  Pertinent Vitals/Pain Pain Assessment: Faces Faces Pain Scale: Hurts even more Pain Location: L UE (shoulder) Pain Descriptors / Indicators: Discomfort;Guarding Pain Intervention(s): Limited activity within patient's tolerance;Monitored during session;Repositioned(educated RN to keep UE elevated to support humerus)     Hand Dominance Right    Extremity/Trunk Assessment Upper Extremity Assessment Upper Extremity Assessment: Generalized weakness;LUE deficits/detail LUE Deficits / Details: limited assessment until MD clarification; no ROM completed at shoulder/elbow; hand/wrist grossly 3/5; pt reports numbness; noted edema  LUE: Unable to fully assess due to pain(did not move shoulder until cleared by MD ) LUE Sensation: decreased light touch;decreased proprioception LUE Coordination: decreased fine motor;decreased gross motor   Lower Extremity Assessment Lower Extremity Assessment: Defer to PT evaluation       Communication Communication Communication: No difficulties   Cognition Arousal/Alertness: Awake/alert Behavior During Therapy: Flat affect Overall Cognitive Status: No family/caregiver present to determine baseline cognitive functioning Area of Impairment: Orientation;Attention;Memory;Following commands;Safety/judgement;Awareness;Problem solving                 Orientation Level: Disoriented to;Situation;Time Current Attention Level: Sustained Memory: Decreased short-term memory Following Commands: Follows one step commands consistently;Follows one step commands with increased time Safety/Judgement: Decreased awareness of safety;Decreased awareness of deficits Awareness: Intellectual Problem Solving: Slow processing;Decreased initiation;Difficulty sequencing;Requires verbal cues General Comments: patient oriented to self and place, initally unable to reports month but able to at completion of session;  inconsintent with history initally reporting falling several weeks ago, but then reports falling right before admission.  Poor awareness to deficits and safety. Incontinent of BM in bed.  Attempted counting backwards and unable to attend to task.    General Comments  educated RN on position of L UE for comfort/pain control     Exercises     Shoulder Instructions      Home Living Family/patient expects to be  discharged to:: Private residence Living Arrangements: Spouse/significant other Available Help at Discharge: Family;Available PRN/intermittently(spouse works) Type of Home: House Home Access: Level entry     Home Layout: One level     Bathroom Shower/Tub: Teacher, early years/pre: Standard     Home Equipment: Cane - single point;Bedside commode   Additional Comments: pt limited historian       Prior Functioning/Environment Level of Independence: Independent        Comments: reports independent with ADLs, mobility but limited historian (initally reporting needing assist for ADLs since fall, but then reporting she was managing independently)         OT Problem List: Decreased strength;Decreased range of motion;Decreased activity tolerance;Impaired balance (sitting and/or standing);Decreased coordination;Decreased cognition;Decreased safety awareness;Decreased knowledge of use of DME or AE;Decreased knowledge of precautions;Obesity;Pain;Impaired UE functional use;Increased edema;Impaired sensation      OT Treatment/Interventions: Self-care/ADL training;Therapeutic exercise;DME and/or AE instruction;Therapeutic activities;Cognitive remediation/compensation;Patient/family education;Balance training    OT Goals(Current goals can be found in the care plan section) Acute Rehab OT Goals Patient Stated Goal: less pain  OT Goal Formulation: With patient Time For Goal Achievement: 10/02/19 Potential to Achieve Goals: Fair  OT Frequency: Min 2X/week   Barriers to D/C: Decreased caregiver support          Co-evaluation              AM-PAC OT "6 Clicks" Daily Activity     Outcome Measure Help from another person eating meals?: A Little Help from another person taking care of personal grooming?: A Little Help from another person toileting, which includes using toliet, bedpan, or urinal?: Total  Help from another person bathing (including washing, rinsing, drying)?:  A Lot Help from another person to put on and taking off regular upper body clothing?: A Lot Help from another person to put on and taking off regular lower body clothing?: Total 6 Click Score: 12   End of Session Nurse Communication: Mobility status;Precautions  Activity Tolerance: Patient limited by pain Patient left: in bed;with call bell/phone within reach;with bed alarm set;with nursing/sitter in room  OT Visit Diagnosis: Other abnormalities of gait and mobility (R26.89);Muscle weakness (generalized) (M62.81);Pain;History of falling (Z91.81);Other symptoms and signs involving cognitive function Pain - Right/Left: Left Pain - part of body: Shoulder                Time: 8676-1950 OT Time Calculation (min): 24 min Charges:  OT General Charges $OT Visit: 1 Visit OT Evaluation $OT Eval Moderate Complexity: 1 Mod OT Treatments $Self Care/Home Management : 8-22 mins  Jolaine Artist, OT Acute Rehabilitation Services Pager [phone] Office [phone]   Delight Stare 09/18/2019, 11:28 AM

## 2019-09-18 NOTE — TOC Initial Note (Addendum)
Transition of Care [facility]) - Initial/Assessment Note    Patient Details  Name: Vanessa [name] MRN: [id number] Date of Birth: Feb 03, 1956  [facility] CM/SW Contact:    Marilu Favre, RN Phone Number: 09/18/2019, 3:26 PM  Clinical Narrative:                 Patient confirmed face sheet information, from home with husband. Patient does not have any DME at home.   PT recommendations for SNF at discharge, patient agreeable. Called husband [facility] and placed on speaker phone with patient and husband's permission. Discussed SNF [facility] also in agreement. Explained process. Also explained patient will need another covid test prior to discharge. SNF's not allowing any visitors. Patient and husband voiced understanding. Faxed [facility] await offers. Messaged MD for potential discharge date to submit insurance authorization.   Expected Discharge Plan: Skilled Nursing Facility Barriers to Discharge: Continued Medical Work up   Patient Goals and CMS Choice Patient states their goals for this hospitalization and ongoing recovery are:: to get stronger CMS Medicare.gov Compare Post Acute Care list provided to:: Patient Choice offered to / list presented to : Patient  Expected Discharge Plan and Services Expected Discharge Plan: [place]   Discharge Planning Services: CM Consult Post Acute Care Choice: [place] Living arrangements for the past 2 months: Apartment                   DME Agency: NA       HH Arranged: NA          Prior Living Arrangements/Services Living arrangements for the past 2 months: Apartment Lives with:: Spouse Patient language and need for interpreter reviewed:: Yes Do you feel safe going back to the place where you live?: Yes      Need for Family Participation in Patient Care: Yes (Comment) Care giver support system in place?: Yes (comment)   Criminal Activity/Legal Involvement Pertinent to Current  Situation/Hospitalization: No - Comment as needed  Activities of Daily Living Home Assistive Devices/Equipment: Eyeglasses ADL Screening (condition at time of admission) Patient's cognitive ability adequate to safely complete daily activities?: Yes Is the patient deaf or have difficulty hearing?: No Does the patient have difficulty seeing, even when wearing glasses/contacts?: No Does the patient have difficulty concentrating, remembering, or making decisions?: No Patient able to express need for assistance with ADLs?: Yes Does the patient have difficulty dressing or bathing?: No Independently performs ADLs?: Yes (appropriate for developmental age) Does the patient have difficulty walking or climbing stairs?: No Weakness of Legs: None Weakness of Arms/Hands: None  Permission Sought/Granted   Permission granted to share information with : Yes, Verbal Permission Granted  Share Information with NAME: Semya Klinke [phone]           Emotional Assessment Appearance:: Appears stated age Attitude/Demeanor/Rapport: Engaged Affect (typically observed): Accepting Orientation: : Oriented to Self, Oriented to Place, Oriented to  Time, Oriented to Situation Alcohol / Substance Use: Not Applicable Psych Involvement: No (comment)  Admission diagnosis:  Hypokalemia [E87.6] Pruritic disorder [L29.9] Hypertensive urgency [I16.0] AKI (acute kidney injury) ([place]) [N17.9] Postmenopausal vaginal bleeding [N95.0] Symptomatic anemia [D64.9] Patient Active Problem List   Diagnosis Date Noted  . MSSA bacteremia 09/18/2019  . Postmenopausal bleeding 09/15/2019  . Thrombocytopenia ([place]) 09/15/2019  . Generalized pruritus 09/15/2019  . Acute on chronic renal insufficiency 09/15/2019  . Hyperuricemia 09/15/2019  . Hypokalemia 09/15/2019  . Symptomatic anemia 09/14/2019  . Non compliance w medication  regimen 03/24/2015  . Dyslipidemia 03/24/2015  . DM type 2 (diabetes mellitus, type 2) ([place])  05/23/2014  . HTN (hypertension) 05/23/2014   PCP:  Maren Reamer, MD (Inactive) Pharmacy:   [facility] [address] [place]), [place] - [address] [place] ([place])  [address] Phone: [phone] Fax: [place], [place] [address] [place] [address] Phone: [phone] Fax: [phone]  [id number] - , [place]. [place] [address] Phone: [phone] Fax: [phone]     Social Determinants of Health (SDOH) Interventions    Readmission Risk Interventions No flowsheet data found.

## 2019-09-18 NOTE — Progress Notes (Signed)
Subjective: Patient reports her bleeding has completely resolved with medication.  She is currently most bothered by toothache and a feeling of "water in her chest", as well as her current toileting solution. She reports she would be agreeable to attempting EMB at bedside, and is aware that she would likely notice increase in bleeding, as well as that procedure might not be successful or tolerable at this time.    Objective: I have reviewed patient's vital signs and labs.  General: cooperative and no distress Vaginal Bleeding: denies  Head: Normocephalic, atraumatic, Poor dentition noted Lungs: normal effort, no respiratory distress Abdomen: soft, nontender  Assessment/Plan: 64 yo [id number] with postmenopausal bleeding, symptomatic anemia, thrombocytopenia, and multiple medical comorbidities -- Bleeding resolved on Megace 80mg  BID -- Per request of heme/onc, will attempt to obtain inpatient EMB, and patient is currently agreeable to attempt, however may take some time to secure supplies and arrange for patient room.  Discussed this attempt occurring over next several days with patient.  Discussed risk of increased bleeding, procedural complications including uterine injury, as well as possibility of lack of success of attempt at bedside.  It appears patient was ordered for vaginal ultrasound, but was unable to tolerate the vaginal probe. -- Currently platelets & H/H would allow attempt, however severe thrombocytopenia would likely preclude attempt to obtain tissue sample until improved.   LOS: 4 days    Debbrah Alar 09/18/2019, 11:52 AM

## 2019-09-18 NOTE — Consult Note (Signed)
[name] for Infectious Disease    Date of Admission:  09/14/2019     Total days of antibiotics 2               Reason for Consult: MSSA bacteremia   Referring Provider: Tivis Ringer / Auto-consult Primary Care Provider: Maren Reamer, MD (Inactive)   ASSESSMENT:  Vanessa [name] is a 64 y/o female admitted with symptomatic anemia including thrombocyotopenia related to vaginal bleeding with concern for possible malignancy. Subsequently has elevated leukocytosis and now MSSA bacteremia. Vanessa [name] does have multiple lesions and wounds which are likely the source of infection of her MSSA.  Antimicrobial therapy has been changed to Ancef. Will check TTE to rule out endocarditis and may require TEE if able depending upon anemia. Recheck blood cultures in the morning.   PLAN:  1. Continue current dose of Ancef 2. Recheck blood cultures in the morning. 3. TTE to rule out endocarditis and may require TEE. 4. Anemia and abnormal vaginal bleeding per GYN, primary team and hematology. 5. Wound care per primary team.    Principal Problem:   MSSA bacteremia Active Problems:   Postmenopausal bleeding   Thrombocytopenia ([place])   DM type 2 (diabetes mellitus, type 2) (HCC)   HTN (hypertension)   Symptomatic anemia   Generalized pruritus   Acute on chronic renal insufficiency   Hyperuricemia   Hypokalemia   . amLODipine  10 mg Oral Daily  . atorvastatin  40 mg Oral Daily  . diclofenac Sodium  2 g Topical QID  . ferrous sulfate  325 mg Oral BID WC  . influenza vaccine adjuvanted  0.5 mL Intramuscular Tomorrow-1000  . insulin aspart  0-15 Units Subcutaneous TID WC  . insulin aspart  0-5 Units Subcutaneous QHS  . labetalol  200 mg Oral TID  . megestrol  80 mg Oral BID  . pneumococcal 23 valent vaccine  0.5 mL Intramuscular Tomorrow-1000     HPI: Vanessa [name] is a 64 y.o. female with hypertension, asthma and Type 2 diabetes who was admitted with the chief complaint of whole body  itching and multiple sores on her body; intermittent shortness of breath; and intermittent blurry vision.   Vanessa [name] was afebrile and hypertensive in the ED with BP of 210/97. Lab work with significant anemia of 4.8, potassium 3.1, creatinine 1.9, and platelets of 66. US pelvis performed with secondary to post-vaginal bleeding with poorly defined endometrium appearing thickened. Follow up CT abdomen pelvis limited due to absence of contrast; anemia; and small right sided pleural effusion. GYN consulted with recommendations for Megace and endometrial sampling which was recommended for outpatient setting. Hematology evaluated with DIC panel showing elevated fibrinogen and d-dimer with thrombocytopenia likely related to heavy bleeding and possible DIC. Vanessa [name] did develop new leukocytosis from 09/16/19 to 09/17/19 at 15.1.  Blood cultures positive for MSSA in 2/4 bottles.   Vanessa [name] has been afebrile since admission and continues with leukocytosis with most recent WBC of 14.8. Vanessa [name] has been started on IV iron as well as received a total of 3 units of PRBC. Denies any current fevers at present. Has not been on any antibiotics recently. Has not been seen for these prior to her hospitalization. Vaginal bleeding has stopped.   Review of Systems: Review of Systems  Constitutional: Negative for chills, fever and weight loss.  Respiratory: Negative for cough, shortness of breath and wheezing.   Cardiovascular: Negative for chest pain and leg swelling.  Gastrointestinal: Negative for abdominal  pain, constipation, diarrhea, nausea and vomiting.  Skin: Negative for rash.     Past Medical History:  Diagnosis Date  . Asthma   . Diabetes mellitus without complication ([place])   . Heart murmur   . Hypertension     Social History   Tobacco Use  . Smoking status: Never Smoker  . Smokeless tobacco: Never Used  Substance Use Topics  . Alcohol use: No  . Drug use: No    Family History  Problem Relation Age of  Onset  . Stroke Mother   . Diabetes Mother   . Hypertension Mother   . Diabetes Brother   . Hypertension Brother     No Known Allergies  OBJECTIVE: Blood pressure (!) 162/80, pulse 81, temperature 99 F (37.2 C), temperature source Oral, resp. rate 20, height 5' (1.524 m), weight 75.3 kg, SpO2 100 %.  Physical Exam Constitutional:      General: Vanessa [name] is not in acute distress.    Appearance: Vanessa [name] is well-developed.  HENT:     Mouth/Throat:     Comments: Very poor dentition.  Cardiovascular:     Rate and Rhythm: Normal rate and regular rhythm.     Heart sounds: Normal heart sounds.  Pulmonary:     Effort: Pulmonary effort is normal.     Breath sounds: Normal breath sounds.  Skin:    General: Skin is warm and dry.     Comments: Multiple lesions located on the bilateral lower extremities that appear oval shaped with no significant drainage.   Neurological:     Mental Status: Vanessa [name] is alert and oriented to person, place, and time.  Psychiatric:        Behavior: Behavior normal.        Thought Content: Thought content normal.        Judgment: Judgment normal.     Lab Results Lab Results  Component Value Date   WBC 14.8 (H) 09/18/2019   HGB 7.4 (L) 09/18/2019   HCT 23.8 (L) 09/18/2019   MCV 83.5 09/18/2019   PLT 55 (L) 09/18/2019    Lab Results  Component Value Date   CREATININE 2.12 (H) 09/18/2019   BUN 30 (H) 09/18/2019   NA 142 09/18/2019   K 4.4 09/18/2019   CL 115 (H) 09/18/2019   CO2 18 (L) 09/18/2019    Lab Results  Component Value Date   ALT 15 09/17/2019   AST 14 (L) 09/17/2019   ALKPHOS 13 (L) 09/17/2019   BILITOT 0.8 09/17/2019     Microbiology: Recent Results (from the past 240 hour(s))  SARS CORONAVIRUS 2 (TAT 6-24 HRS) Nasopharyngeal Nasopharyngeal Swab     Status: None   Collection Time: 09/14/19  5:25 PM   Specimen: Nasopharyngeal Swab  Result Value Ref Range Status   SARS Coronavirus 2 NEGATIVE NEGATIVE Final    Comment: (NOTE) SARS-CoV-2  target nucleic acids are NOT DETECTED. The SARS-CoV-2 RNA is generally detectable in upper and lower respiratory specimens during the acute phase of infection. Negative results do not preclude SARS-CoV-2 infection, do not rule out co-infections with other pathogens, and should not be used as the sole basis for treatment or other patient management decisions. Negative results must be combined with clinical observations, patient history, and epidemiological information. The expected result is Negative. Fact Sheet for Patients: SugarRoll.be Fact Sheet for Healthcare Providers: https://www.woods-mathews.com/ This test is not yet approved or cleared by the [place] FDA and  has been authorized for detection and/or diagnosis of SARS-CoV-2  by FDA under an Emergency Use Authorization (EUA). This EUA will remain  in effect (meaning this test can be used) for the duration of the COVID-19 declaration under Section 56 4(b)(1) of the Act, 21 U.S.C. section 360bbb-3(b)(1), unless the authorization is terminated or revoked sooner. Performed at [facility], [place] [address], [place], [place] [address]   Culture, blood (routine x 2)     Status: Abnormal (Preliminary result)   Collection Time: 09/17/19  8:40 AM   Specimen: BLOOD  Result Value Ref Range Status   Specimen Description BLOOD LEFT ANTECUBITAL  Final   Special Requests   Final    BOTTLES DRAWN AEROBIC ONLY Blood Culture results may not be optimal due to an inadequate volume of blood received in culture bottles   Culture  Setup Time   Final    AEROBIC BOTTLE ONLY GRAM POSITIVE COCCI IN CLUSTERS CRITICAL RESULT CALLED TO, READ BACK BY AND VERIFIED WITH: Shellee Milo [facility] 09/17/19 2358 JDW Performed at [facility], [place] [address], [place], [place] [address]    Culture STAPHYLOCOCCUS AUREUS (A)  Final   Report Status PENDING  Incomplete  Blood Culture ID Panel (Reflexed)     Status:  Abnormal   Collection Time: 09/17/19  8:40 AM  Result Value Ref Range Status   Enterococcus species NOT DETECTED NOT DETECTED Final   Listeria monocytogenes NOT DETECTED NOT DETECTED Final   Staphylococcus species DETECTED (A) NOT DETECTED Final    Comment: CRITICAL RESULT CALLED TO, READ BACK BY AND VERIFIED WITH: L SEAY PHARMD 09/17/19 2358 JDW    Staphylococcus aureus (BCID) DETECTED (A) NOT DETECTED Final    Comment: Methicillin (oxacillin) susceptible Staphylococcus aureus (MSSA). Preferred therapy is anti staphylococcal beta lactam antibiotic (Cefazolin or Nafcillin), unless clinically contraindicated. CRITICAL RESULT CALLED TO, READ BACK BY AND VERIFIED WITH: L SEAY PHARMD 09/17/19 2358 JDW    Methicillin resistance NOT DETECTED NOT DETECTED Final   Streptococcus species NOT DETECTED NOT DETECTED Final   Streptococcus agalactiae NOT DETECTED NOT DETECTED Final   Streptococcus pneumoniae NOT DETECTED NOT DETECTED Final   Streptococcus pyogenes NOT DETECTED NOT DETECTED Final   Acinetobacter baumannii NOT DETECTED NOT DETECTED Final   Enterobacteriaceae species NOT DETECTED NOT DETECTED Final   Enterobacter cloacae complex NOT DETECTED NOT DETECTED Final   Escherichia coli NOT DETECTED NOT DETECTED Final   Klebsiella oxytoca NOT DETECTED NOT DETECTED Final   Klebsiella pneumoniae NOT DETECTED NOT DETECTED Final   Proteus species NOT DETECTED NOT DETECTED Final   Serratia marcescens NOT DETECTED NOT DETECTED Final   Haemophilus influenzae NOT DETECTED NOT DETECTED Final   Neisseria meningitidis NOT DETECTED NOT DETECTED Final   Pseudomonas aeruginosa NOT DETECTED NOT DETECTED Final   Candida albicans NOT DETECTED NOT DETECTED Final   Candida glabrata NOT DETECTED NOT DETECTED Final   Candida krusei NOT DETECTED NOT DETECTED Final   Candida parapsilosis NOT DETECTED NOT DETECTED Final   Candida tropicalis NOT DETECTED NOT DETECTED Final    Comment: Performed at [facility], [place]. [address] [place], [place] [address]  Culture, blood (routine x 2)     Status: Abnormal (Preliminary result)   Collection Time: 09/17/19  8:47 AM   Specimen: BLOOD  Result Value Ref Range Status   Specimen Description BLOOD RIGHT ANTECUBITAL  Final   Special Requests   Final    BOTTLES DRAWN AEROBIC ONLY Blood Culture results may not be optimal due to an inadequate volume  of blood received in culture bottles   Culture  Setup Time   Final    AEROBIC BOTTLE ONLY GRAM POSITIVE COCCI IN CLUSTERS CRITICAL VALUE NOTED.  VALUE IS CONSISTENT WITH PREVIOUSLY REPORTED AND CALLED VALUE. Performed at [facility], [place] [address], [place], [place] [address]    Culture STAPHYLOCOCCUS AUREUS (A)  Final   Report Status PENDING  Incomplete     Terri Piedra, NP [place] [facility] [place] [phone] Pager  09/18/2019  4:50 PM

## 2019-09-18 NOTE — Progress Notes (Signed)
Noted very poor dentition and loose teeth, pt states she has a tooth ache.

## 2019-09-18 NOTE — Progress Notes (Addendum)
PROGRESS NOTE    Vanessa [name]  FGH:[id number] DOB: 16-Jul-1955 DOA: 09/14/2019 PCP: Maren Reamer, MD (Inactive)    Brief Narrative:  Patient was admitted to the hospital with a working diagnosis of symptomatic anemia in the setting of transvaginal bleeding.   64 year old female who presented with vaginal bleeding.  She does have significant past medical history for hypertension, type 2 diabetes mellitus and asthma.  Patient reported no bleeding for several months.  Positive dyspnea over the last 3 weeks prior to hospitalization.  Physical examination her blood pressure was 179/79, heart rate 79, respiratory rate 19, temperature 97.7, oxygenation 100%, her lungs are clear to auscultation bilaterally, heart is also present rhythmic, soft abdomen, +2+ lower extremity edema. Sodium 140, potassium 3.1, chloride 112, bicarb 18, glucose 113, BUN 27, creatinine 1.9, white count 6.8, hemoglobin 4.8, hematocrit 17.6, platelets 115.  Patient has received total of 3 units packed red blood cells, 1 dose of Feraheme.  Her vaginal ultrasound had thickened endometrium.  Patient has been placed on Megace per GYN service.  Patient will be scheduled to have inpatient endometrial biopsy.  Assessment & Plan:   Principal Problem:   MSSA bacteremia Active Problems:   DM type 2 (diabetes mellitus, type 2) (HCC)   HTN (hypertension)   Symptomatic anemia   Postmenopausal bleeding   Thrombocytopenia (HCC)   Generalized pruritus   Acute on chronic renal insufficiency   Hyperuricemia   Hypokalemia   1. Severe symptomatic anemia due to acute on chronic blood loss due to vaginal bleeding/ thrombocytopenia. Will continue close monitoring or cell count. Patient is sp 3 units prbc transfusion. Hgb today 7.4 with Hct at 23 and Plt at 55.   Target Hgb more than 7. Will follow with GYN and oncology for biopsy endometrium.  2. HTN. Will continue blood pressure monitoring. Continue with amlodipine   3. T2DM/  dyslipidemia . Continue glucose cover and monitoring with insulin sliding scale. Patient with poor oral intake, positive dental pain.   Continue with statin therapy.   4. CKD stage 3b. Close follow up of renal function and electrolytes, patient with poor oral intake.   5. Obesity class 2/ right shoulder pain/ BMI is 32. Will add topical diclofenac to right shoulder/    6. New leukocytosis with MSSA bacteremia. Will continue antibiotic therapy with anceg, follow on echocardiogram. Follow on repeat blood cultures.      DVT prophylaxis: scd   Code Status: full Family Communication: no family at the bedside  Disposition Plan/ discharge barriers: patient from home, barrier for dc severe decondition and symptomatic anemia, pending placement SNF.      Consultants:   ID  Oncology   GYN      Subjective: Patient having dyspnea at rest and worse with exertion, positive right mandibular pain and left shoulder pain. No nausea or vomiting. Not clear if continue bleeding.   Objective: Vitals:   09/17/19 2043 09/18/19 0416 09/18/19 0500 09/18/19 1350  BP: 128/66 132/83  (!) 162/80  Pulse: 77 74  81  Resp: 17 19  20   Temp: 98 F (36.7 C) 98.5 F (36.9 C)  99 F (37.2 C)  TempSrc: Oral Oral  Oral  SpO2: 99% 100%  100%  Weight:   75.3 kg   Height:        Intake/Output Summary (Last 24 hours) at 09/18/2019 1418 Last data filed at 09/18/2019 1215 Gross per 24 hour  Intake 857.33 ml  Output 200 ml  Net 657.33 ml  Filed Weights   09/15/19 0406 09/18/19 0500  Weight: 76.1 kg 75.3 kg    Examination:   General: Not in pain or dyspnea, deconditioned and ill looking appearing, in pain.  Neurology: Awake and alert, non focal  E ENT: positive pallor, no icterus, oral mucosa dry. Cardiovascular: No JVD. S1-S2 present, rhythmic, no gallops, rubs, or murmurs. No lower extremity edema. Pulmonary: positive breath sounds bilaterally, adequate air movement, no wheezing, rhonchi or  rales. Gastrointestinal. Abdomen with no organomegaly, non tender, no rebound or guarding Skin. No rashes Musculoskeletal: no joint deformities     Data Reviewed: I have personally reviewed following labs and imaging studies  CBC: Recent Labs  Lab 09/14/19 1620 09/14/19 1725 09/15/19 0616 09/16/19 0139 09/16/19 1612 09/17/19 0317 09/17/19 0843 09/18/19 0255  WBC 6.8  --  6.7 6.5  --  15.1*  --  14.8*  NEUTROABS 5.4  --   --  5.3  --  14.2*  --  13.9*  HGB 4.8*   < > 6.8* 6.3* 8.4* 7.6*  --  7.4*  HCT 17.6*   < > 22.1*  22.3* 20.4* 26.5* 24.7*  --  23.8*  MCV 83.4  --  82.5 81.3  --  82.6  --  83.5  PLT 115*   < > 66* 55*  --  48* 52* 55*   < > = values in this interval not displayed.   Basic Metabolic Panel: Recent Labs  Lab 09/14/19 1620 09/15/19 0616 09/16/19 0139 09/17/19 0317 09/18/19 0255  NA 140 140 142 144 142  K 3.1* 2.7* 3.5 4.1 4.4  CL 112* 109 113* 115* 115*  CO2 18* 19* 19* 18* 18*  GLUCOSE 113* 92 126* 137* 154*  BUN 27* 23 27* 24* 30*  CREATININE 1.90* 1.78* 2.02* 1.97* 2.12*  CALCIUM 8.2* 8.3* 8.3* 8.7* 8.8*  MG  --  2.1 2.2  --   --   PHOS  --  3.9 3.3  --   --    GFR: Estimated Creatinine Clearance: 24 mL/min (A) (by C-G formula based on SCr of 2.12 mg/dL (H)). Liver Function Tests: Recent Labs  Lab 09/14/19 1620 09/17/19 0317  AST 31 14*  ALT 19 15  ALKPHOS 80 13*  BILITOT 0.6 0.8  PROT 7.4 6.8  ALBUMIN 3.0* 2.6*   No results for input(s): LIPASE, AMYLASE in the last 168 hours. No results for input(s): AMMONIA in the last 168 hours. Coagulation Profile: Recent Labs  Lab 09/14/19 1725 09/17/19 0843  INR 1.1 1.1   Cardiac Enzymes: No results for input(s): CKTOTAL, CKMB, CKMBINDEX, TROPONINI in the last 168 hours. BNP (last 3 results) No results for input(s): PROBNP in the last 8760 hours. HbA1C: No results for input(s): HGBA1C in the last 72 hours. CBG: Recent Labs  Lab 09/17/19 1215 09/17/19 1736 09/17/19 2045  09/18/19 0738 09/18/19 1152  GLUCAP 168* 128* 143* 115* 140*   Lipid Profile: No results for input(s): CHOL, HDL, LDLCALC, TRIG, CHOLHDL, LDLDIRECT in the last 72 hours. Thyroid Function Tests: No results for input(s): TSH, T4TOTAL, FREET4, T3FREE, THYROIDAB in the last 72 hours. Anemia Panel: No results for input(s): VITAMINB12, FOLATE, FERRITIN, TIBC, IRON, RETICCTPCT in the last 72 hours.    Radiology Studies: I have reviewed all of the imaging during this hospital visit personally     Scheduled Meds: . amLODipine  10 mg Oral Daily  . atorvastatin  40 mg Oral Daily  . ferrous sulfate  325 mg Oral BID WC  .  influenza vaccine adjuvanted  0.5 mL Intramuscular Tomorrow-1000  . insulin aspart  0-15 Units Subcutaneous TID WC  . insulin aspart  0-5 Units Subcutaneous QHS  . labetalol  200 mg Oral TID  . megestrol  80 mg Oral BID  . pneumococcal 23 valent vaccine  0.5 mL Intramuscular Tomorrow-1000  . potassium chloride  40 mEq Oral Daily   Continuous Infusions: . sodium chloride    .  ceFAZolin (ANCEF) IV 2 g (09/18/19 1215)     LOS: 4 days        Edynn Gillock Gerome Apley, MD

## 2019-09-18 NOTE — NC FL2 (Signed)
[place] LEVEL OF CARE SCREENING TOOL     IDENTIFICATION  Patient Name: Vanessa [name] Birthdate: 1955/07/13 Sex: female Admission Date (Current Location): 09/14/2019  [facility] and [place] Number:  Herbalist and Address:  [facility], [place] [address] [place], [place] [address]      Provider Number: [id number]  Attending Physician Name and Address:  Tawni Millers,*  Relative Name and Phone Number:  Rion Schnitzer [phone] husband    Current Level of Care: Hospital Recommended Level of Care: [facility] Prior Approval Number:    Date Approved/Denied:   PASRR Number: [id number]  Discharge Plan: SNF    Current Diagnoses: Patient Active Problem List   Diagnosis Date Noted  . MSSA bacteremia 09/18/2019  . Postmenopausal bleeding 09/15/2019  . Thrombocytopenia ([place]) 09/15/2019  . Generalized pruritus 09/15/2019  . Acute on chronic renal insufficiency 09/15/2019  . Hyperuricemia 09/15/2019  . Hypokalemia 09/15/2019  . Symptomatic anemia 09/14/2019  . Non compliance w medication regimen 03/24/2015  . Dyslipidemia 03/24/2015  . DM type 2 (diabetes mellitus, type 2) ([place]) 05/23/2014  . HTN (hypertension) 05/23/2014    Orientation RESPIRATION BLADDER Height & Weight     Self, Time, Situation, Place  Normal Continent Weight: 75.3 kg Height:  5' (152.4 cm)  BEHAVIORAL SYMPTOMS/MOOD NEUROLOGICAL BOWEL NUTRITION STATUS      Continent Diet  AMBULATORY STATUS COMMUNICATION OF NEEDS Skin   Limited Assist Verbally Other (Comment)(wounds to BLE, various stages, some small other large, dry, dark and green hue, Wash with soap and water pat dry, Apply criticaid clear place xeroform , wrap kelix then ace wrap daily)                       Personal Care Assistance Level of Assistance  Bathing, Feeding, Dressing Bathing Assistance: Limited assistance Feeding assistance: Limited assistance Dressing  Assistance: Limited assistance     Functional Limitations Info             SPECIAL CARE FACTORS FREQUENCY  PT (By licensed PT), OT (By licensed OT)     PT Frequency: five times a week OT Frequency: five times a week            Contractures Contractures Info: Not present    Additional Factors Info  Code Status, Allergies, Insulin Sliding Scale Code Status Info: Full Allergies Info: No known   Insulin Sliding Scale Info: Novolog 0 to 15 units SQ TID with meals, Novolog 0 to 5 units at bedtime       Current Medications (09/18/2019):  This is the current hospital active medication list Current Facility-Administered Medications  Medication Dose Route Frequency Provider Last Rate Last Admin  . 0.9 %  sodium chloride infusion  10 mL/hr Intravenous Once Bennie Pierini, MD      . acetaminophen (TYLENOL) tablet 650 mg  650 mg Oral Q6H PRN Bennie Pierini, MD   650 mg at 09/18/19 0102   Or  . acetaminophen (TYLENOL) suppository 650 mg  650 mg Rectal Q6H PRN Bennie Pierini, MD      . amLODipine (NORVASC) tablet 10 mg  10 mg Oral Daily Bennie Pierini, MD   10 mg at 09/18/19 1146  . atorvastatin (LIPITOR) tablet 40 mg  40 mg Oral Daily Bennie Pierini, MD   40 mg at 09/18/19 1145  . ceFAZolin (ANCEF) IVPB 2g/100 mL premix  2 g Intravenous Q12H  Alma Friendly, MD 200 mL/hr at 09/18/19 1215 2 g at 09/18/19 1215  . diclofenac Sodium (VOLTAREN) 1 % topical gel 2 g  2 g Topical QID Arrien, Jimmy Picket, MD      . diphenhydrAMINE-zinc acetate (BENADRYL) 2-0.1 % cream   Topical TID PRN Bennie Pierini, MD      . ferrous sulfate tablet 325 mg  325 mg Oral BID WC Barb Merino, MD   325 mg at 09/18/19 1146  . hydrOXYzine (ATARAX/VISTARIL) tablet 25 mg  25 mg Oral Q6H PRN Barb Merino, MD   25 mg at 09/16/19 1024  . influenza vaccine adjuvanted (FLUAD) injection 0.5 mL  0.5 mL Intramuscular Tomorrow-1000 Schertz, Michele Mcalpine, MD      . insulin aspart (novoLOG)  injection 0-15 Units  0-15 Units Subcutaneous TID WC Bennie Pierini, MD   2 Units at 09/18/19 1223  . insulin aspart (novoLOG) injection 0-5 Units  0-5 Units Subcutaneous QHS Bennie Pierini, MD      . labetalol (NORMODYNE) tablet 200 mg  200 mg Oral TID Bennie Pierini, MD   200 mg at 09/18/19 1146  . megestrol (MEGACE) tablet 80 mg  80 mg Oral BID Donnamae Jude, MD   80 mg at 09/18/19 1147  . ondansetron (ZOFRAN) tablet 4 mg  4 mg Oral Q6H PRN Bennie Pierini, MD       Or  . ondansetron [facility]) injection 4 mg  4 mg Intravenous Q6H PRN Bennie Pierini, MD      . oxyCODONE (Oxy IR/ROXICODONE) immediate release tablet 5 mg  5 mg Oral Q4H PRN Bennie Pierini, MD   5 mg at 09/16/19 1024  . pneumococcal 23 valent vaccine (PNEUMOVAX-23) injection 0.5 mL  0.5 mL Intramuscular Tomorrow-1000 Bennie Pierini, MD         Discharge Medications: Please see discharge summary for a list of discharge medications.  Relevant Imaging Results:  Relevant Lab Results:   Additional Information SSI [phone]  Jonny Dearden, Edson Snowball, RN

## 2019-09-18 NOTE — Progress Notes (Addendum)
PHARMACY - PHYSICIAN COMMUNICATION CRITICAL VALUE ALERT - BLOOD CULTURE IDENTIFICATION (BCID)  Vanessa [name] is an 64 y.o. female who presented to [facility] on 09/14/2019 with a chief complaint of weakness  Assessment:  2/2 BC S aureus Name of physician (or Provider) Contacted: Ardith Dark  Current antibiotics: none Changes to prescribed antibiotics recommended:  Start ancef 2gm IV q12  Results for orders placed or performed during the hospital encounter of 09/14/19  Blood Culture ID Panel (Reflexed) (Collected: 09/17/2019  8:40 AM)  Result Value Ref Range   Enterococcus species NOT DETECTED NOT DETECTED   Listeria monocytogenes NOT DETECTED NOT DETECTED   Staphylococcus species DETECTED (A) NOT DETECTED   Staphylococcus aureus (BCID) DETECTED (A) NOT DETECTED   Methicillin resistance NOT DETECTED NOT DETECTED   Streptococcus species NOT DETECTED NOT DETECTED   Streptococcus agalactiae NOT DETECTED NOT DETECTED   Streptococcus pneumoniae NOT DETECTED NOT DETECTED   Streptococcus pyogenes NOT DETECTED NOT DETECTED   Acinetobacter baumannii NOT DETECTED NOT DETECTED   Enterobacteriaceae species NOT DETECTED NOT DETECTED   Enterobacter cloacae complex NOT DETECTED NOT DETECTED   Escherichia coli NOT DETECTED NOT DETECTED   Klebsiella oxytoca NOT DETECTED NOT DETECTED   Klebsiella pneumoniae NOT DETECTED NOT DETECTED   Proteus species NOT DETECTED NOT DETECTED   Serratia marcescens NOT DETECTED NOT DETECTED   Haemophilus influenzae NOT DETECTED NOT DETECTED   Neisseria meningitidis NOT DETECTED NOT DETECTED   Pseudomonas aeruginosa NOT DETECTED NOT DETECTED   Candida albicans NOT DETECTED NOT DETECTED   Candida glabrata NOT DETECTED NOT DETECTED   Candida krusei NOT DETECTED NOT DETECTED   Candida parapsilosis NOT DETECTED NOT DETECTED   Candida tropicalis NOT DETECTED NOT DETECTED    Vanessa [name] 09/18/2019  12:25 AM

## 2019-09-19 DIAGNOSIS — R7881 Bacteremia: Secondary | ICD-10-CM

## 2019-09-19 DIAGNOSIS — B9561 Methicillin susceptible Staphylococcus aureus infection as the cause of diseases classified elsewhere: Secondary | ICD-10-CM

## 2019-09-19 LAB — BASIC METABOLIC PANEL
Anion gap: 12 (ref 5–15)
BUN: 33 mg/dL — ABNORMAL HIGH (ref 8–23)
CO2: 16 mmol/L — ABNORMAL LOW (ref 22–32)
Calcium: 8.8 mg/dL — ABNORMAL LOW (ref 8.9–10.3)
Chloride: 112 mmol/L — ABNORMAL HIGH (ref 98–111)
Creatinine, Ser: 2.01 mg/dL — ABNORMAL HIGH (ref 0.44–1.00)
GFR calc Af Amer: 29 mL/min — ABNORMAL LOW (ref >60)
GFR calc non Af Amer: 25 mL/min — ABNORMAL LOW (ref >60)
Glucose, Bld: 103 mg/dL — ABNORMAL HIGH (ref 70–99)
Potassium: 4.5 mmol/L (ref 3.5–5.1)
Sodium: 140 mmol/L (ref 135–145)

## 2019-09-19 LAB — CBC WITH DIFFERENTIAL/PLATELET
Abs Immature Granulocytes: 0.09 10*3/uL — ABNORMAL HIGH (ref 0.00–0.07)
Basophils Absolute: 0 10*3/uL (ref 0.0–0.1)
Basophils Relative: 0 %
Eosinophils Absolute: 0 10*3/uL (ref 0.0–0.5)
Eosinophils Relative: 0 %
HCT: 24.7 % — ABNORMAL LOW (ref 36.0–46.0)
Hemoglobin: 7.4 g/dL — ABNORMAL LOW (ref 12.0–15.0)
Immature Granulocytes: 1 %
Lymphocytes Relative: 3 %
Lymphs Abs: 0.6 10*3/uL — ABNORMAL LOW (ref 0.7–4.0)
MCH: 25.5 pg — ABNORMAL LOW (ref 26.0–34.0)
MCHC: 30 g/dL (ref 30.0–36.0)
MCV: 85.2 fL (ref 80.0–100.0)
Monocytes Absolute: 0.6 10*3/uL (ref 0.1–1.0)
Monocytes Relative: 4 %
Neutro Abs: 15 10*3/uL — ABNORMAL HIGH (ref 1.7–7.7)
Neutrophils Relative %: 92 %
Platelets: 79 10*3/uL — ABNORMAL LOW (ref 150–400)
RBC: 2.9 MIL/uL — ABNORMAL LOW (ref 3.87–5.11)
RDW: 22 % — ABNORMAL HIGH (ref 11.5–15.5)
WBC: 16.3 10*3/uL — ABNORMAL HIGH (ref 4.0–10.5)
nRBC: 0.2 % (ref 0.0–0.2)

## 2019-09-19 LAB — KAPPA/LAMBDA LIGHT CHAINS
Kappa free light chain: 120.8 mg/L — ABNORMAL HIGH (ref 3.3–19.4)
Kappa, lambda light chain ratio: 0.91 (ref 0.26–1.65)
Lambda free light chains: 132.3 mg/L — ABNORMAL HIGH (ref 5.7–26.3)

## 2019-09-19 LAB — GLUCOSE, CAPILLARY
Glucose-Capillary: 118 mg/dL — ABNORMAL HIGH (ref 70–99)
Glucose-Capillary: 101 mg/dL — ABNORMAL HIGH (ref 70–99)
Glucose-Capillary: 118 mg/dL — ABNORMAL HIGH (ref 70–99)
Glucose-Capillary: 114 mg/dL — ABNORMAL HIGH (ref 70–99)
Glucose-Capillary: 98 mg/dL (ref 70–99)

## 2019-09-19 LAB — CULTURE, BLOOD (ROUTINE X 2)

## 2019-09-19 LAB — URINE CULTURE: Culture: <10000 — AB

## 2019-09-19 NOTE — Progress Notes (Signed)
[facility]  Date of Admission:  09/14/2019     Total days of antibiotics 3         ASSESSMENT:  Ms. Ignasiak postmenopausal bleeding has resolved and is awaiting endometrial biopsy. MSSA bacteremia likely from her scratching of her leg wounds. Repeat blood cultures are pending. TTE without evidence of endocarditis with EF of 55-60% with no significant valvular abnormality. Will consider TEE. Will continue current dose of Cefazolin and await clearance of blood cultures. Continue wound care per primary team and Happy RN recommendations. Likely disposition to SNF.   PLAN:  1. Continue Cefazolin 2. Monitor repeat cultures for clearance of bacteremia. Hold PICC placement until blood cultures are cleared.  3. Wound care per primary team and Wound RN. 4. Awaiting endometrial biopsy with GYN.   Principal Problem:   MSSA bacteremia Active Problems:   Postmenopausal bleeding   Thrombocytopenia (HCC)   DM type 2 (diabetes mellitus, type 2) (HCC)   HTN (hypertension)   Symptomatic anemia   Generalized pruritus   Acute on chronic renal insufficiency   Hyperuricemia   Hypokalemia   . amLODipine  10 mg Oral Daily  . atorvastatin  40 mg Oral Daily  . diclofenac Sodium  2 g Topical QID  . ferrous sulfate  325 mg Oral BID WC  . influenza vaccine adjuvanted  0.5 mL Intramuscular Tomorrow-1000  . insulin aspart  0-15 Units Subcutaneous TID WC  . insulin aspart  0-5 Units Subcutaneous QHS  . labetalol  200 mg Oral TID  . megestrol  80 mg Oral BID  . pneumococcal 23 valent vaccine  0.5 mL Intramuscular Tomorrow-1000    SUBJECTIVE:  Afebrile overnight with mild increase in leukocytosis up to 16.3. from 14.8. Repeat cultures remain pending. No acute events overnight. Having increased amounts of itching.   No Known Allergies   Review of Systems: Review of Systems  Constitutional: Negative for chills, fever and weight loss.  Respiratory: Negative for cough,  shortness of breath and wheezing.   Cardiovascular: Negative for chest pain and leg swelling.  Gastrointestinal: Negative for abdominal pain, constipation, diarrhea, nausea and vomiting.  Skin: Negative for rash.      OBJECTIVE: Vitals:   09/18/19 1350 09/18/19 2037 09/19/19 0422 09/19/19 0555  BP: (!) 162/80 (!) 156/80  (!) 162/83  Pulse: 81 75  61  Resp: 20 14  16   Temp: 99 F (37.2 C) (!) 97.3 F (36.3 C)  97.9 F (36.6 C)  TempSrc: Oral Oral  Oral  SpO2: 100% 98%  100%  Weight:   74.4 kg   Height:       Body mass index is 32.03 kg/m.  Physical Exam Constitutional:      General: She is not in acute distress.    Appearance: She is well-developed.  Cardiovascular:     Rate and Rhythm: Normal rate and regular rhythm.     Heart sounds: Normal heart sounds.  Pulmonary:     Effort: Pulmonary effort is normal.     Breath sounds: Normal breath sounds.  Skin:    General: Skin is warm and dry.  Neurological:     Mental Status: She is alert and oriented to person, place, and time.  Psychiatric:        Behavior: Behavior normal.        Thought Content: Thought content normal.        Judgment: Judgment normal.     Lab Results Lab Results  Component  Value Date   WBC 16.3 (H) 09/19/2019   HGB 7.4 (L) 09/19/2019   HCT 24.7 (L) 09/19/2019   MCV 85.2 09/19/2019   PLT 79 (L) 09/19/2019    Lab Results  Component Value Date   CREATININE 2.01 (H) 09/19/2019   BUN 33 (H) 09/19/2019   NA 140 09/19/2019   K 4.5 09/19/2019   CL 112 (H) 09/19/2019   CO2 16 (L) 09/19/2019    Lab Results  Component Value Date   ALT 15 09/17/2019   AST 14 (L) 09/17/2019   ALKPHOS 13 (L) 09/17/2019   BILITOT 0.8 09/17/2019     Microbiology: Recent Results (from the past 240 hour(s))  SARS CORONAVIRUS 2 (TAT 6-24 HRS) Nasopharyngeal Nasopharyngeal Swab     Status: None   Collection Time: 09/14/19  5:25 PM   Specimen: Nasopharyngeal Swab  Result Value Ref Range Status   SARS  Coronavirus 2 NEGATIVE NEGATIVE Final    Comment: (NOTE) SARS-CoV-2 target nucleic acids are NOT DETECTED. The SARS-CoV-2 RNA is generally detectable in upper and lower respiratory specimens during the acute phase of infection. Negative results do not preclude SARS-CoV-2 infection, do not rule out co-infections with other pathogens, and should not be used as the sole basis for treatment or other patient management decisions. Negative results must be combined with clinical observations, patient history, and epidemiological information. The expected result is Negative. Fact Sheet for Patients: SugarRoll.be Fact Sheet for Healthcare Providers: https://www.woods-mathews.com/ This test is not yet approved or cleared by the [place] FDA and  has been authorized for detection and/or diagnosis of SARS-CoV-2 by FDA under an Emergency Use Authorization (EUA). This EUA will remain  in effect (meaning this test can be used) for the duration of the COVID-19 declaration under Section 56 4(b)(1) of the Act, 21 U.S.C. section 360bbb-3(b)(1), unless the authorization is terminated or revoked sooner. Performed at [facility], [place] [address], [place], [place] [address]   Culture, blood (routine x 2)     Status: Abnormal   Collection Time: 09/17/19  8:40 AM   Specimen: BLOOD  Result Value Ref Range Status   Specimen Description BLOOD LEFT ANTECUBITAL  Final   Special Requests   Final    BOTTLES DRAWN AEROBIC ONLY Blood Culture results may not be optimal due to an inadequate volume of blood received in culture bottles   Culture  Setup Time   Final    AEROBIC BOTTLE ONLY GRAM POSITIVE COCCI IN CLUSTERS CRITICAL RESULT CALLED TO, READ BACK BY AND VERIFIED WITH: Shellee Milo [facility] 09/17/19 2358 JDW Performed at [facility], [place] [address] [place], [place] [address]    Culture STAPHYLOCOCCUS AUREUS (A)  Final   Report Status 09/19/2019 FINAL  Final    Organism ID, Bacteria STAPHYLOCOCCUS AUREUS  Final      Susceptibility   Staphylococcus aureus - MIC*    CIPROFLOXACIN <=0.5 SENSITIVE Sensitive     ERYTHROMYCIN <=0.25 SENSITIVE Sensitive     GENTAMICIN <=0.5 SENSITIVE Sensitive     OXACILLIN <=0.25 SENSITIVE Sensitive     TETRACYCLINE <=1 SENSITIVE Sensitive     VANCOMYCIN 1 SENSITIVE Sensitive     TRIMETH/SULFA <=10 SENSITIVE Sensitive     CLINDAMYCIN <=0.25 SENSITIVE Sensitive     RIFAMPIN <=0.5 SENSITIVE Sensitive     Inducible Clindamycin NEGATIVE Sensitive     * STAPHYLOCOCCUS AUREUS  Blood Culture ID Panel (Reflexed)     Status: Abnormal   Collection Time: 09/17/19  8:40  AM  Result Value Ref Range Status   Enterococcus species NOT DETECTED NOT DETECTED Final   Listeria monocytogenes NOT DETECTED NOT DETECTED Final   Staphylococcus species DETECTED (A) NOT DETECTED Final    Comment: CRITICAL RESULT CALLED TO, READ BACK BY AND VERIFIED WITH: L SEAY PHARMD 09/17/19 2358 JDW    Staphylococcus aureus (BCID) DETECTED (A) NOT DETECTED Final    Comment: Methicillin (oxacillin) susceptible Staphylococcus aureus (MSSA). Preferred therapy is anti staphylococcal beta lactam antibiotic (Cefazolin or Nafcillin), unless clinically contraindicated. CRITICAL RESULT CALLED TO, READ BACK BY AND VERIFIED WITH: L SEAY PHARMD 09/17/19 2358 JDW    Methicillin resistance NOT DETECTED NOT DETECTED Final   Streptococcus species NOT DETECTED NOT DETECTED Final   Streptococcus agalactiae NOT DETECTED NOT DETECTED Final   Streptococcus pneumoniae NOT DETECTED NOT DETECTED Final   Streptococcus pyogenes NOT DETECTED NOT DETECTED Final   Acinetobacter baumannii NOT DETECTED NOT DETECTED Final   Enterobacteriaceae species NOT DETECTED NOT DETECTED Final   Enterobacter cloacae complex NOT DETECTED NOT DETECTED Final   Escherichia coli NOT DETECTED NOT DETECTED Final   Klebsiella oxytoca NOT DETECTED NOT DETECTED Final   Klebsiella pneumoniae NOT DETECTED  NOT DETECTED Final   Proteus species NOT DETECTED NOT DETECTED Final   Serratia marcescens NOT DETECTED NOT DETECTED Final   Haemophilus influenzae NOT DETECTED NOT DETECTED Final   Neisseria meningitidis NOT DETECTED NOT DETECTED Final   Pseudomonas aeruginosa NOT DETECTED NOT DETECTED Final   Candida albicans NOT DETECTED NOT DETECTED Final   Candida glabrata NOT DETECTED NOT DETECTED Final   Candida krusei NOT DETECTED NOT DETECTED Final   Candida parapsilosis NOT DETECTED NOT DETECTED Final   Candida tropicalis NOT DETECTED NOT DETECTED Final    Comment: Performed at [facility], [place]. [address], [place], [place] [address]  Culture, blood (routine x 2)     Status: Abnormal   Collection Time: 09/17/19  8:47 AM   Specimen: BLOOD  Result Value Ref Range Status   Specimen Description BLOOD RIGHT ANTECUBITAL  Final   Special Requests   Final    BOTTLES DRAWN AEROBIC ONLY Blood Culture results may not be optimal due to an inadequate volume of blood received in culture bottles   Culture  Setup Time   Final    AEROBIC BOTTLE ONLY GRAM POSITIVE COCCI IN CLUSTERS CRITICAL VALUE NOTED.  VALUE IS CONSISTENT WITH PREVIOUSLY REPORTED AND CALLED VALUE.    Culture (A)  Final    STAPHYLOCOCCUS AUREUS SUSCEPTIBILITIES PERFORMED ON PREVIOUS CULTURE WITHIN THE LAST 5 DAYS. Performed at [facility], [place] [address], [place], [place] [address]    Report Status 09/19/2019 FINAL  Final  Culture, Urine     Status: Abnormal   Collection Time: 09/18/19 12:15 PM   Specimen: Urine, Random  Result Value Ref Range Status   Specimen Description URINE, RANDOM  Final   Special Requests NONE  Final   Culture (A)  Final    <10,000 COLONIES/mL INSIGNIFICANT GROWTH Performed at [facility], [place] [address] [place], [place] [address]    Report Status 09/19/2019 FINAL  Final     Terri Piedra, NP [place] [facility] [phone]  Pager  09/19/2019  10:31 AM

## 2019-09-19 NOTE — NC FL2 (Signed)
[place] LEVEL OF CARE SCREENING TOOL     IDENTIFICATION  Patient Name: Vanessa [name] Birthdate: 1955/09/14 Sex: female Admission Date (Current Location): 09/14/2019  [facility] and [place] Number:  Herbalist and Address:  [facility], [place] [address] [place], [place] [address]      Provider Number: [id number]  Attending Physician Name and Address:  Tawni Millers,*  Relative Name and Phone Number:  Debroah Shuttleworth [phone] husband    Current Level of Care: Hospital Recommended Level of Care: [place] Prior Approval Number:    Date Approved/Denied:   PASRR Number: [id number]  Discharge Plan: SNF    Current Diagnoses: Patient Active Problem List   Diagnosis Date Noted  . MSSA bacteremia 09/18/2019  . Postmenopausal bleeding 09/15/2019  . Thrombocytopenia ([place]) 09/15/2019  . Generalized pruritus 09/15/2019  . Acute on chronic renal insufficiency 09/15/2019  . Hyperuricemia 09/15/2019  . Hypokalemia 09/15/2019  . Symptomatic anemia 09/14/2019  . Non compliance w medication regimen 03/24/2015  . Dyslipidemia 03/24/2015  . DM type 2 (diabetes mellitus, type 2) ([place]) 05/23/2014  . HTN (hypertension) 05/23/2014    [place] RESPIRATION BLADDER Height & Weight     Self, Time, Situation, Place  Normal Continent Weight: 74.4 kg Height:  5' (152.4 cm)  BEHAVIORAL SYMPTOMS/MOOD NEUROLOGICAL BOWEL NUTRITION STATUS      Continent Diet  AMBULATORY STATUS COMMUNICATION OF NEEDS Skin   Limited Assist Verbally Other (Comment)(wounds to BLE, various stages, some small other large, dry, dark and green hue, Wash with soap and water pat dry, Apply criticaid clear place xeroform , wrap kelix then ace wrap daily)                       Personal Care Assistance Level of Assistance  Bathing, Feeding, Dressing Bathing Assistance: Limited assistance Feeding assistance: Limited assistance Dressing  Assistance: Limited assistance     Functional Limitations Info             SPECIAL CARE FACTORS FREQUENCY  PT (By licensed PT), OT (By licensed OT)     PT Frequency: five times a week OT Frequency: five times a week            Contractures Contractures Info: Not present    Additional Factors Info  Code Status, Allergies, Insulin Sliding Scale Code Status Info: Full Allergies Info: No [place] allergies   Insulin Sliding Scale Info: insulin aspart (novoLOG) injection 0-15 Units   QID       Current Medications (09/19/2019):  This is the current hospital active medication list Current Facility-Administered Medications  Medication Dose Route Frequency Provider Last Rate Last Admin  . 0.9 %  sodium chloride infusion  10 mL/hr Intravenous Once Bennie Pierini, MD      . acetaminophen (TYLENOL) tablet 650 mg  650 mg Oral Q6H PRN Bennie Pierini, MD   650 mg at 09/18/19 1727   Or  . acetaminophen (TYLENOL) suppository 650 mg  650 mg Rectal Q6H PRN Bennie Pierini, MD      . amLODipine (NORVASC) tablet 10 mg  10 mg Oral Daily Bennie Pierini, MD   10 mg at 09/19/19 1019  . atorvastatin (LIPITOR) tablet 40 mg  40 mg Oral Daily Bennie Pierini, MD   40 mg at 09/19/19 1019  . ceFAZolin (ANCEF) IVPB 2g/100 mL premix  2 g Intravenous Q12H Alma Friendly, MD  Stopped at 09/18/19 2150  . diclofenac Sodium (VOLTAREN) 1 % topical gel 2 g  2 g Topical QID Arrien, Jimmy Picket, MD   2 g at 09/19/19 1020  . diphenhydrAMINE-zinc acetate (BENADRYL) 2-0.1 % cream   Topical TID PRN Bennie Pierini, MD   Given at 09/19/19 1027  . ferrous sulfate tablet 325 mg  325 mg Oral BID WC Barb Merino, MD   325 mg at 09/19/19 0824  . hydrOXYzine (ATARAX/VISTARIL) tablet 25 mg  25 mg Oral Q6H PRN Barb Merino, MD   25 mg at 09/19/19 0346  . influenza vaccine adjuvanted (FLUAD) injection 0.5 mL  0.5 mL Intramuscular Tomorrow-1000 Schertz, Michele Mcalpine, MD      . insulin aspart  (novoLOG) injection 0-15 Units  0-15 Units Subcutaneous TID WC Bennie Pierini, MD   2 Units at 09/18/19 1727  . insulin aspart (novoLOG) injection 0-5 Units  0-5 Units Subcutaneous QHS Bennie Pierini, MD      . labetalol (NORMODYNE) tablet 200 mg  200 mg Oral TID Bennie Pierini, MD   200 mg at 09/19/19 1019  . megestrol (MEGACE) tablet 80 mg  80 mg Oral BID Donnamae Jude, MD   80 mg at 09/19/19 1019  . ondansetron (ZOFRAN) tablet 4 mg  4 mg Oral Q6H PRN Bennie Pierini, MD       Or  . ondansetron [facility]) injection 4 mg  4 mg Intravenous Q6H PRN Bennie Pierini, MD      . oxyCODONE (Oxy IR/ROXICODONE) immediate release tablet 5 mg  5 mg Oral Q4H PRN Bennie Pierini, MD   5 mg at 09/16/19 1024  . pneumococcal 23 valent vaccine (PNEUMOVAX-23) injection 0.5 mL  0.5 mL Intramuscular Tomorrow-1000 Bennie Pierini, MD         Discharge Medications: Please see discharge summary for a list of discharge medications.  Relevant Imaging Results:  Relevant Lab Results:   Additional Information SSI [phone], Ancef IV x 14 days  Marilu Favre, RN

## 2019-09-19 NOTE — TOC Progression Note (Addendum)
Transition of Care [facility]) - Progression Note    Patient Details  Name: Vanessa [name] MRN: [id number] Date of Birth: [birth year]  Transition of [facility] CM/SW Contact  Jacalyn Lefevre Edson Snowball, RN Phone Number: 09/19/2019, 10:47 AM  Clinical Narrative:     Provided bed offers to patient with Medicare.gov ratings. Called patient's husband from hospital room [facility] [phone]. No answer and voice mail box full. Patient reviewed the offers , NCM offered to follow up for decision, however, patient decided on [facility]. Will message MD regarding discharge date to coordinate insurance auth and covid test.  Freda Munro with [facility] can accept patient at discharge, once PICC placed ( aware waiting on cx) and insurance auth received.  Expected Discharge Plan: [place] Barriers to Discharge: Continued Medical Work up  Expected Discharge Plan and Services Expected Discharge Plan: [place]   Discharge Planning Services: CM Consult Post Acute Care Choice: [place] Living arrangements for the past 2 months: Apartment                   DME Agency: NA       HH Arranged: NA           Social Determinants of Health (SDOH) Interventions    Readmission Risk Interventions No flowsheet data found.

## 2019-09-19 NOTE — Progress Notes (Addendum)
HEMATOLOGY-ONCOLOGY PROGRESS NOTE  SUBJECTIVE: Her bilateral lower extremity dressings are being changed to the time my visit.  The patient was noted to have MSSA bacteremia.  ID is following and she is currently on cefazolin.  Remains afebrile.  She has no complaints today.  States vaginal bleeding has now stopped.  She has not noticed any other bleeding.  Itching seems better.  REVIEW OF SYSTEMS:   Constitutional: Denies fevers, chills  Respiratory: Denies cough, dyspnea or wheezes Cardiovascular: Denies palpitation, chest discomfort Gastrointestinal:  Denies nausea, heartburn or change in bowel habits Skin: Reports itching Lymphatics: Denies new lymphadenopathy or easy bruising Neurological:Denies numbness, tingling or new weaknesses Behavioral/Psych: Mood is stable, no new changes  Extremities: No lower extremity edema All other systems were reviewed with the patient and are negative.  I have reviewed the past medical history, past surgical history, social history and family history with the patient and they are unchanged from previous note.   PHYSICAL EXAMINATION:  Vitals:   09/18/19 2037 09/19/19 0555  BP: (!) 156/80 (!) 162/83  Pulse: 75 61  Resp: 14 16  Temp: (!) 97.3 F (36.3 C) 97.9 F (36.6 C)  SpO2: 98% 100%   Filed Weights   09/15/19 0406 09/18/19 0500 09/19/19 0422  Weight: 76.1 kg 75.3 kg 74.4 kg    Intake/Output from previous day: 03/10 0701 - 03/11 0700 In: 61 [P.O.:620; IV Piggyback:200] Out: 900 [Urine:900]  General:  well-nourished in no acute distress.     Respiratory: lungs were clear bilaterally without wheezing or crackles. Cardiovascular:  Regular rate and rhythm, S1/S2, without murmur, rub or gallop. There was no pedal edema.   GI:  abdomen was soft, flat, nontender, nondistended, without organomegaly.  Musculoskeletal:  no spinal tenderness of palpation of vertebral spine.   Skin exam: Multiple scabs noted on her chest and bilateral  legs NEURO: alert & oriented x 3 with fluent speech, no focal motor/sensory deficits  LABORATORY DATA:  I have reviewed the data as listed CMP Latest Ref Rng & Units 09/19/2019 09/18/2019 09/17/2019  Glucose 70 - 99 mg/dL 103(H) 154(H) 137(H)  BUN 8 - 23 mg/dL 33(H) 30(H) 24(H)  Creatinine 0.44 - 1.00 mg/dL 2.01(H) 2.12(H) 1.97(H)  Sodium 135 - 145 mmol/L 140 142 144  Potassium 3.5 - 5.1 mmol/L 4.5 4.4 4.1  Chloride 98 - 111 mmol/L 112(H) 115(H) 115(H)  CO2 22 - 32 mmol/L 16(L) 18(L) 18(L)  Calcium 8.9 - 10.3 mg/dL 8.8(L) 8.8(L) 8.7(L)  Total Protein 6.5 - 8.1 g/dL - - 6.8  Total Bilirubin 0.3 - 1.2 mg/dL - - 0.8  Alkaline Phos 38 - 126 U/L - - 13(L)  AST 15 - 41 U/L - - 14(L)  ALT 0 - 44 U/L - - 15    Lab Results  Component Value Date   WBC 16.3 (H) 09/19/2019   HGB 7.4 (L) 09/19/2019   HCT 24.7 (L) 09/19/2019   MCV 85.2 09/19/2019   PLT 79 (L) 09/19/2019   NEUTROABS 15.0 (H) 09/19/2019    CT ABDOMEN PELVIS WO CONTRAST  Result Date: 09/15/2019 CLINICAL DATA:  Nausea and vomiting.  Concern for pelvic malignancy. EXAM: CT ABDOMEN AND PELVIS WITHOUT CONTRAST TECHNIQUE: Multidetector CT imaging of the abdomen and pelvis was performed following the standard protocol without IV contrast. COMPARISON:  September 14, 2019 FINDINGS: Lower chest: There is a small right-sided pleural effusion.The heart size is mildly enlarged. The intracardiac blood pool is hypodense relative to the adjacent myocardium consistent with anemia. Hepatobiliary: The  liver is normal. Normal gallbladder.There is no biliary ductal dilation. Pancreas: Normal contours without ductal dilatation. No peripancreatic fluid collection. Spleen: No splenic laceration or hematoma. Adrenals/Urinary Tract: --Adrenal glands: No adrenal hemorrhage. --Right kidney/ureter: No hydronephrosis or perinephric hematoma. --Left kidney/ureter: The left kidney is atrophic. This may be secondary to a distal left ureteral stone measuring approximately 7  mm (axial series 3, image 58), however the ureter at this level is difficult to follow. --Urinary bladder: Unremarkable. Stomach/Bowel: --Stomach/Duodenum: No hiatal hernia or other gastric abnormality. Normal duodenal course and caliber. --Small bowel: No dilatation or inflammation. --Colon: No focal abnormality. --Appendix: Normal. Vascular/Lymphatic: Normal course and caliber of the major abdominal vessels. --No retroperitoneal lymphadenopathy. --No mesenteric lymphadenopathy. --No pelvic or inguinal lymphadenopathy. Reproductive: The uterus is not well evaluated in the absence of IV contrast. Other: There is a trace volume of ascites. The abdominal wall is normal. Musculoskeletal. No acute displaced fractures. IMPRESSION: 1. Suboptimal evaluation of the uterus in the absence of IV contrast. See separate recent ultrasound for further evaluation and recommendations. 2. Anemia. 3. Small right-sided pleural effusion. 4. Trace abdominal ascites. 5. Atrophic left kidney which may be secondary to a distal left ureteral stone measuring approximately 7 mm. The left kidney is likely nonfunctional as there is no left-sided hydronephrosis. Electronically Signed   By: Constance Holster M.D.   On: 09/15/2019 02:16   DG Forearm Left  Result Date: 09/17/2019 CLINICAL DATA:  Recent fall with arm pain, initial encounter EXAM: LEFT FOREARM - 2 VIEW COMPARISON:  None. FINDINGS: There is no evidence of fracture or other focal bone lesions. Soft tissues are unremarkable. IMPRESSION: No acute abnormality noted.  Mild soft tissue swelling is seen. Electronically Signed   By: Inez Catalina M.D.   On: 09/17/2019 16:27   CT HEAD WO CONTRAST  Result Date: 09/17/2019 CLINICAL DATA:  Encephalopathy EXAM: CT HEAD WITHOUT CONTRAST TECHNIQUE: Contiguous axial images were obtained from the base of the skull through the vertex without intravenous contrast. COMPARISON:  None. FINDINGS: Brain: Mild atrophy. Hypodensity right frontal corona  radiata consistent with chronic ischemia. Patchy white matter hypodensity bilaterally appears chronic. Negative for acute infarct, hemorrhage, mass. Vascular: Negative for hyperdense vessel Skull: Negative Sinuses/Orbits: Mild mucosal edema paranasal sinuses. Negative orbit. Soft tissue swelling of the left eyelid consistent with a dermal lesion. Other: None IMPRESSION: No acute abnormality. Mild atrophy and mild chronic microvascular ischemic change. Electronically Signed   By: Franchot Gallo M.D.   On: 09/17/2019 17:21   US PELVIS (TRANSABDOMINAL ONLY)  Result Date: 09/14/2019 CLINICAL DATA:  Postmenopausal vaginal bleeding. EXAM: TRANSABDOMINAL ULTRASOUND OF PELVIS TECHNIQUE: Transabdominal ultrasound examination of the pelvis was performed including evaluation of the uterus, ovaries, adnexal regions, and pelvic cul-de-sac. Attempt was made at transvaginal exam, however patient did not tolerate transvaginal probe. COMPARISON:  None. FINDINGS: Uterus Measurements: 10.0 x 5.0 x 6.4 cm = volume: 164 mL. No fibroids or other mass visualized. Endometrium Poorly defined. Endometrium appears mildly thickened, tentatively measured at 8 mm. Right ovary Not visualized.  No adnexal mass. Left ovary Not visualized.  No adnexal mass. Other findings:  No abnormal free fluid. IMPRESSION: Endometrium is poorly defined, however appears thickened measuring approximately 8-9 mm. In the setting of post-menopausal bleeding, endometrial sampling is indicated to exclude carcinoma. If results are benign, sonohysterogram should be considered for focal lesion work-up. (Ref: Radiological Reasoning: Algorithmic Workup of Abnormal Vaginal Bleeding with Endovaginal Sonography and Sonohysterography. AJR 2008; 381:O17-51) Electronically Signed   By: Keith Rake  M.D.   On: 09/14/2019 23:23   DG Chest Port 1 View  Result Date: 09/17/2019 CLINICAL DATA:  Weakness and leukocytosis. EXAM: PORTABLE CHEST 1 VIEW COMPARISON:  Chest x-ray  09/14/2019 FINDINGS: The heart is mildly enlarged but stable. Stable mild tortuosity of the thoracic aorta. Low lung volumes with vascular crowding and streaky basilar atelectasis. No infiltrates or effusions. No pulmonary lesions. The bony thorax is intact. Stable healing left humeral neck fracture. IMPRESSION: Low lung volumes with vascular crowding and streaky basilar atelectasis. Electronically Signed   By: Marijo Sanes M.D.   On: 09/17/2019 09:08   DG Chest Port 1 View  Result Date: 09/14/2019 CLINICAL DATA:  Shortness of breath. EXAM: PORTABLE CHEST 1 VIEW COMPARISON:  03/06/2014 FINDINGS: Enlarged cardiac silhouette with an interval significant increase in size. Clear lungs. Interval prominence of the pulmonary vasculature in the upper lung zones. No pleural fluid. Thoracic spine degenerative changes. Interval probable old, healed left humeral neck fracture. Mild right shoulder degenerative changes. IMPRESSION: Interval cardiomegaly and pulmonary vascular congestion in the upper lung zones. Electronically Signed   By: Claudie Revering M.D.   On: 09/14/2019 16:58   DG Shoulder Left Port  Result Date: 09/17/2019 CLINICAL DATA:  Shoulder pain EXAM: LEFT SHOULDER COMPARISON:  09/14/2019 FINDINGS: Proximal left humeral fracture is noted with involvement primarily the surgical neck. Callus formation is noted consistent with healing. No acute fracture is seen. No other focal abnormality is noted. IMPRESSION: Healing proximal left humeral fracture Electronically Signed   By: Inez Catalina M.D.   On: 09/17/2019 16:31   ECHOCARDIOGRAM COMPLETE  Result Date: 09/18/2019    ECHOCARDIOGRAM REPORT   Patient Name:   JOCELYNN GIOFFRE Pfefferkorn Date of Exam: 09/18/2019 Medical Rec #:  [id number]       Height:       60.0 in Accession #:    [id number]      Weight:       166.0 lb Date of Birth:  1955/12/02       BSA:          1.725 m Patient Age:    31 years        BP:           162/80 mmHg Patient Gender: F               HR:            80 bpm. Exam Location:  Inpatient Procedure: 2D Echo, Cardiac Doppler and Color Doppler Indications:    Bacteremia 790.7 / R78.81  History:        Patient has no prior history of Echocardiogram examinations.                 Signs/Symptoms:Murmur; Risk Factors:Hypertension and Diabetes.  Sonographer:    Tiffany Dance Referring Phys: [id number] [place]  1. Left ventricular ejection fraction, by estimation, is 55 to 60%. The left ventricle has normal function. The left ventricle has no regional wall motion abnormalities. Left ventricular diastolic parameters are consistent with Grade II diastolic dysfunction (pseudonormalization). Elevated left ventricular end-diastolic pressure.  2. Right ventricular systolic function is normal. The right ventricular size is normal. There is moderately elevated pulmonary artery systolic pressure. The estimated right ventricular systolic pressure is 87.5 mmHg.  3. Left atrial size was moderately dilated.  4. The mitral valve is abnormal. Mild to moderate mitral valve regurgitation.  5. The aortic valve is tricuspid. Aortic valve regurgitation is trivial. Mild aortic valve sclerosis  is present, with no evidence of aortic valve stenosis.  6. The inferior vena cava is dilated in size with <50% respiratory variability, suggesting right atrial pressure of 15 mmHg. Conclusion(s)/Recommendation(s): No evidence of valvular vegetations on this transthoracic echocardiogram. Would recommend a transesophageal echocardiogram to exclude infective endocarditis if clinically indicated. FINDINGS  Left Ventricle: Left ventricular ejection fraction, by estimation, is 55 to 60%. The left ventricle has normal function. The left ventricle has no regional wall motion abnormalities. The left ventricular internal cavity size was normal in size. There is  no left ventricular hypertrophy. Left ventricular diastolic parameters are consistent with Grade II diastolic dysfunction  (pseudonormalization). Elevated left ventricular end-diastolic pressure. Right Ventricle: The right ventricular size is normal. No increase in right ventricular wall thickness. Right ventricular systolic function is normal. There is moderately elevated pulmonary artery systolic pressure. The tricuspid regurgitant velocity is 3.26 m/s, and with an assumed right atrial pressure of 15 mmHg, the estimated right ventricular systolic pressure is 16.1 mmHg. Left Atrium: Left atrial size was moderately dilated. Right Atrium: Right atrial size was normal in size. Pericardium: There is no evidence of pericardial effusion. Mitral Valve: The mitral valve is abnormal. There is mild thickening of the mitral valve leaflet(s). Mild to moderate mitral valve regurgitation. Tricuspid Valve: The tricuspid valve is grossly normal. Tricuspid valve regurgitation is mild. Aortic Valve: The aortic valve is tricuspid. Aortic valve regurgitation is trivial. Mild aortic valve sclerosis is present, with no evidence of aortic valve stenosis. Pulmonic Valve: The pulmonic valve was grossly normal. Pulmonic valve regurgitation is trivial. Aorta: The aortic root, ascending aorta, aortic arch and descending aorta are all structurally normal, with no evidence of dilitation or obstruction. Venous: The inferior vena cava is dilated in size with less than 50% respiratory variability, suggesting right atrial pressure of 15 mmHg. IAS/Shunts: No atrial level shunt detected by color flow Doppler.  LEFT VENTRICLE PLAX 2D LVIDd:         4.22 cm  Diastology LVIDs:         2.89 cm  LV e' lateral:   6.31 cm/s LV PW:         0.94 cm  LV E/e' lateral: 20.1 LV IVS:        0.90 cm  LV e' medial:    4.79 cm/s LVOT diam:     2.00 cm  LV E/e' medial:  26.5 LV SV:         77 LV SV Index:   45 LVOT Area:     3.14 cm  RIGHT VENTRICLE             IVC RV Basal diam:  2.57 cm     IVC diam: 2.24 cm RV S prime:     11.10 cm/s TAPSE (M-mode): 2.0 cm LEFT ATRIUM              Index       RIGHT ATRIUM           Index LA diam:        3.30 cm 1.91 cm/m  RA Area:     18.30 cm LA Vol (A2C):   62.4 ml 36.18 ml/m RA Volume:   52.10 ml  30.21 ml/m LA Vol (A4C):   71.5 ml 41.46 ml/m LA Biplane Vol: 72.0 ml 41.75 ml/m  AORTIC VALVE LVOT Vmax:   121.00 cm/s LVOT Vmean:  77.500 cm/s LVOT VTI:    0.245 m  AORTA Ao Root diam: 3.10 cm Ao Asc  diam:  3.30 cm MITRAL VALVE                TRICUSPID VALVE MV Area (PHT): 4.06 cm     TR Peak grad:   42.5 mmHg MV Decel Time: 187 msec     TR Vmax:        326.00 cm/s MV E velocity: 127.00 cm/s MV A velocity: 85.90 cm/s   SHUNTS MV E/A ratio:  1.48         Systemic VTI:  0.24 m                             Systemic Diam: 2.00 cm Lyman Bishop MD Electronically signed by Lyman Bishop MD Signature Date/Time: 09/18/2019/6:01:27 PM    Final    VAS [place] LOWER EXTREMITY VENOUS (DVT)  Result Date: 09/16/2019  Lower Venous DVTStudy Indications: Swelling, and Pain.  Comparison Study: no prior Performing Technologist: Abram Sander RVS  Examination Guidelines: A complete evaluation includes B-mode imaging, spectral Doppler, color Doppler, and power Doppler as needed of all accessible portions of each vessel. Bilateral testing is considered an integral part of a complete examination. Limited examinations for reoccurring indications may be performed as noted. The reflux portion of the exam is performed with the patient in reverse Trendelenburg.  +---------+---------------+---------+-----------+----------+--------------+ RIGHT    CompressibilityPhasicitySpontaneityPropertiesThrombus Aging +---------+---------------+---------+-----------+----------+--------------+ CFV      Full           Yes      Yes                                 +---------+---------------+---------+-----------+----------+--------------+ SFJ      Full                                                        +---------+---------------+---------+-----------+----------+--------------+  FV Prox  Full                                                        +---------+---------------+---------+-----------+----------+--------------+ FV Mid   Full                                                        +---------+---------------+---------+-----------+----------+--------------+ FV DistalFull                                                        +---------+---------------+---------+-----------+----------+--------------+ PFV      Full                                                        +---------+---------------+---------+-----------+----------+--------------+  POP      Full           Yes      Yes                                 +---------+---------------+---------+-----------+----------+--------------+ PTV      Full                                                        +---------+---------------+---------+-----------+----------+--------------+ PERO                                                  Not visualized +---------+---------------+---------+-----------+----------+--------------+   +---------+---------------+---------+-----------+----------+--------------+ LEFT     CompressibilityPhasicitySpontaneityPropertiesThrombus Aging +---------+---------------+---------+-----------+----------+--------------+ CFV      Full           Yes      Yes                                 +---------+---------------+---------+-----------+----------+--------------+ SFJ      Full                                                        +---------+---------------+---------+-----------+----------+--------------+ FV Prox  Full                                                        +---------+---------------+---------+-----------+----------+--------------+ FV Mid   Full                                                        +---------+---------------+---------+-----------+----------+--------------+ FV DistalFull                                                         +---------+---------------+---------+-----------+----------+--------------+ PFV      Full                                                        +---------+---------------+---------+-----------+----------+--------------+ POP      Full           Yes      Yes                                 +---------+---------------+---------+-----------+----------+--------------+  PTV                                                   Not visualized +---------+---------------+---------+-----------+----------+--------------+ PERO                                                  Not visualized +---------+---------------+---------+-----------+----------+--------------+     Summary: RIGHT: - There is no evidence of deep vein thrombosis in the lower extremity.  - No cystic structure found in the popliteal fossa.  LEFT: - There is no evidence of deep vein thrombosis in the lower extremity. However, portions of this examination were limited- see technologist comments above.  - No cystic structure found in the popliteal fossa.  *See table(s) above for measurements and observations. Electronically signed by Ruta Hinds MD on 09/16/2019 at 3:22:59 PM.    Final     ASSESSMENT AND PLAN: 1.  Thrombocytopenia 2.  Anemia secondary to postmenopausal bleeding 3.  Hypertension 4.  Type 2 diabetes 5.  CKD 6.  Leukocytosis 7.  Multiple lesions/wounds on her bilateral lower extremities 8.  MSSA bacteremia  -CBC from today has been reviewed.  Platelet count has improved to 79,000.  Thrombocytopenia was likely related to her heavy vaginal bleeding and bacteremia.  Recommend close monitoring.  Will arrange for outpatient follow-up at the cancer center to recheck her CBC after discharge. -Continue oral iron.  We can repeat Feraheme 1 week following her prior dose (09/15/2019). -The patient is being followed closely by ID for MSSA bacteremia.  Repeat blood cultures are pending.   LOS: 5  days   Mikey Bussing, DNP, AGPCNP-BC, AOCNP 09/19/19  Addendum  I have seen the patient, examined her. I agree with the assessment and and plan and have edited the notes.   Lab reviewed, her anemia has been stable since her blood transfusion, and vaginal bleeding stopped. Her thrombocytopenia is improving, likely related to treated bacteriemia. Her light chain levels are both elevated with normal ratio, SPEP still pending. Noticed Dr. Vevelyn Francois did endometrium biopsy today, appreciate it!   Please give second dose Feraheme on 3/14 if she is still here, or could be arranged at outpt setting, at her GYN or our office.   I will continue to f/u as needed before discharge. Let me know when she is ready to be discharged, and I will arrange her f/u with [place].   Truitt Merle  09/19/2019

## 2019-09-19 NOTE — Progress Notes (Signed)
EMB/Pap procedure  Patient consented for speculum exam, Pap and EMB.  Consent signed. Time out performed with bedside nurse Remo Lipps) and patient.  Patient positioned in dorsal lithotomy elevated on upturned and padded bedpan. Self lit speculum inserted for good visualization of cervix and thin prep pap collected. Cervix cleansed with betadine and sterile technique used to grasp anterior lip of cervix with single tooth tenaculum. Pipelle passed easily through cervix and uterus sounded to 8cm.  Abundant tissue collected with two passes and placed in labeled specimen container. Tenaculum removed and tenaculum site hemostatic.   Light bleeding of <5cc from uterus associated with procedure.  Speculum removed. Patient tolerated procedure well.  No complications.  Specimens labeled and specimens with orders personally taken to pathology and cytology.  Will follow up with patient as needed for bleeding, please contact, and when results returned to determine next step.  Patient to continue with Megace at this time.  No uterine bleeding was present on exam today prior to biopsy.

## 2019-09-19 NOTE — Progress Notes (Addendum)
PROGRESS NOTE    Vanessa [name]  OTL:[id number] DOB: [birth year] DOA: 09/14/2019 PCP: Maren Reamer, MD (Inactive)    Brief Narrative:  Patient was admitted to the hospital with a working diagnosis of symptomatic anemia in the setting of transvaginal bleeding. Hospitalization complicated with MSSA bacteremia   64 year old female who presented with vaginal bleeding.  She does have significant past medical history for hypertension, type 2 diabetes mellitus and asthma.  Patient reported no bleeding for several months.  Positive dyspnea over the last 3 weeks prior to hospitalization.  Physical examination her blood pressure was 179/79, heart rate 79, respiratory rate 19, temperature 97.7, oxygenation 100%, her lungs are clear to auscultation bilaterally, heart is also present rhythmic, soft abdomen, +2+ lower extremity edema. Sodium 140, potassium 3.1, chloride 112, bicarb 18, glucose 113, BUN 27, creatinine 1.9, white count 6.8, hemoglobin 4.8, hematocrit 17.6, platelets 115.  Patient has received total of 3 units packed red blood cells, 1 dose of Feraheme.  Her vaginal ultrasound had thickened endometrium.  Patient has been placed on Megace per GYN service.  Patient will be scheduled to have inpatient endometrial biopsy.  On 03/09 #2/2 bottles blood cultures resulted positive for MSSA, follow up work up with echocardiogram showed no vegetations, patient placed on IV cefazolin. Pending results of follow up blood cultures.    Assessment & Plan:   Principal Problem:   MSSA bacteremia Active Problems:   DM type 2 (diabetes mellitus, type 2) (HCC)   HTN (hypertension)   Symptomatic anemia   Postmenopausal bleeding   Thrombocytopenia (HCC)   Generalized pruritus   Acute on chronic renal insufficiency   Hyperuricemia   Hypokalemia    1. Severe symptomatic anemia due to acute on chronic blood loss due to vaginal bleeding/ thrombocytopenia/ iron deficiency. Stable Hgb today at 7.4  with Hct at 24.7 and Plt up to 79. No signs of persistent bleeding.   Continue to follow up with gyn for possible inpatient biopsy. Continue with iron supplementation. Continue with megestrol.   2. HTN. On amlodipine and labetalol for blood pressure control. Blood pressure today 162/83 mmHg.   3. T2DM/ dyslipidemia . Fasting glucose this am is 103, will continue glucose cover and monitoring with insulin sliding scale.   Continue with atorvastatin.   4. CKD stage 3b with non gap metabolic acidosis. Stable renal function with serum cr at 2,0 and K at 4,5 and serum bicarbonate at 16.   Patient with poor oral intake due to dental pain, will continue to follow up renal panel in am, avoid hypotension and nephrotoxic medications.   5. Obesity class 2/ left shoulder pain/ BMI is 32. Improved pain control on left shoulder with topical diclofenac, will continue symptomatic pain control and follow with physical therapy. Continue pain control with oxycodone.   6. New leukocytosis with MSSA bacteremia. Follow up blood cultures no growth, transthoracic echocardiography with no vegetations, will continue antibiotic therapy with cefazolin and follow with ID recommendations, patient may need 2 weeks of IV antibiotic therapy for bacteremia.   DVT prophylaxis: scd   Code Status: full Family Communication: I was not able to reach her husband at the contact number given.  Disposition Plan/ discharge barriers: patient from home, barrier for dc severe decondition and symptomatic anemia, pending placement SNF  Consultants:   ONC  GYN  ID   Procedures:     Antimicrobials:   Cefazolin     Subjective: Patient continue to have left shoulder and right mandibular pain,  improved with analgesics, no signs of persistent bleeding, no nausea or vomiting, positive poor oral intake due to dental pain.   Objective: Vitals:   09/18/19 1350 09/18/19 2037 09/19/19 0422 09/19/19 0555  BP: (!) 162/80 (!)  156/80  (!) 162/83  Pulse: 81 75  61  Resp: 20 14  16   Temp: 99 F (37.2 C) (!) 97.3 F (36.3 C)  97.9 F (36.6 C)  TempSrc: Oral Oral  Oral  SpO2: 100% 98%  100%  Weight:   74.4 kg   Height:        Intake/Output Summary (Last 24 hours) at 09/19/2019 1103 Last data filed at 09/19/2019 0900 Gross per 24 hour  Intake 820 ml  Output 700 ml  Net 120 ml   Filed Weights   09/15/19 0406 09/18/19 0500 09/19/19 0422  Weight: 76.1 kg 75.3 kg 74.4 kg    Examination:   General: Not in pain or dyspnea, deconditioned  Neurology: Awake and alert, non focal., patient with mild confusion and disorientation, no agitation.  E ENT: mild pallor, no icterus, oral mucosa dry Cardiovascular: No JVD. S1-S2 present, rhythmic, no gallops, rubs, or murmurs. Left upper extremity edema Pulmonary: positive breath sounds bilaterally, adequate air movement, no wheezing, rhonchi or rales. Gastrointestinal. Abdomen with mild distention, no organomegaly, non tender, no rebound or guarding Skin. No rashes Musculoskeletal: pain to palpation on left shoulder.      Data Reviewed: I have personally reviewed following labs and imaging studies  CBC: Recent Labs  Lab 09/14/19 1620 09/14/19 1725 09/15/19 0616 09/15/19 0616 09/16/19 0139 09/16/19 1612 09/17/19 0317 09/17/19 0843 09/18/19 0255 09/19/19 0220  WBC 6.8   < > 6.7  --  6.5  --  15.1*  --  14.8* 16.3*  NEUTROABS 5.4  --   --   --  5.3  --  14.2*  --  13.9* 15.0*  HGB 4.8*   < > 6.8*   < > 6.3* 8.4* 7.6*  --  7.4* 7.4*  HCT 17.6*   < > 22.1*  22.3*   < > 20.4* 26.5* 24.7*  --  23.8* 24.7*  MCV 83.4   < > 82.5  --  81.3  --  82.6  --  83.5 85.2  PLT 115*   < > 66*   < > 55*  --  48* 52* 55* 79*   < > = values in this interval not displayed.   Basic Metabolic Panel: Recent Labs  Lab 09/15/19 0616 09/16/19 0139 09/17/19 0317 09/18/19 0255 09/19/19 0220  NA 140 142 144 142 140  K 2.7* 3.5 4.1 4.4 4.5  CL 109 113* 115* 115* 112*  CO2  19* 19* 18* 18* 16*  GLUCOSE 92 126* 137* 154* 103*  BUN 23 27* 24* 30* 33*  CREATININE 1.78* 2.02* 1.97* 2.12* 2.01*  CALCIUM 8.3* 8.3* 8.7* 8.8* 8.8*  MG 2.1 2.2  --   --   --   PHOS 3.9 3.3  --   --   --    GFR: Estimated Creatinine Clearance: 25.2 mL/min (A) (by C-G formula based on SCr of 2.01 mg/dL (H)). Liver Function Tests: Recent Labs  Lab 09/14/19 1620 09/17/19 0317  AST 31 14*  ALT 19 15  ALKPHOS 80 13*  BILITOT 0.6 0.8  PROT 7.4 6.8  ALBUMIN 3.0* 2.6*   No results for input(s): LIPASE, AMYLASE in the last 168 hours. No results for input(s): AMMONIA in the last 168 hours. Coagulation Profile: Recent  Labs  Lab 09/14/19 1725 09/17/19 0843  INR 1.1 1.1   Cardiac Enzymes: No results for input(s): CKTOTAL, CKMB, CKMBINDEX, TROPONINI in the last 168 hours. BNP (last 3 results) No results for input(s): PROBNP in the last 8760 hours. HbA1C: No results for input(s): HGBA1C in the last 72 hours. CBG: Recent Labs  Lab 09/18/19 0738 09/18/19 1152 09/18/19 1658 09/18/19 2117 09/19/19 0800  GLUCAP 115* 140* 135* 118* 101*   Lipid Profile: No results for input(s): CHOL, HDL, LDLCALC, TRIG, CHOLHDL, LDLDIRECT in the last 72 hours. Thyroid Function Tests: No results for input(s): TSH, T4TOTAL, FREET4, T3FREE, THYROIDAB in the last 72 hours. Anemia Panel: No results for input(s): VITAMINB12, FOLATE, FERRITIN, TIBC, IRON, RETICCTPCT in the last 72 hours.    Radiology Studies: I have reviewed all of the imaging during this hospital visit personally     Scheduled Meds: . amLODipine  10 mg Oral Daily  . atorvastatin  40 mg Oral Daily  . diclofenac Sodium  2 g Topical QID  . ferrous sulfate  325 mg Oral BID WC  . influenza vaccine adjuvanted  0.5 mL Intramuscular Tomorrow-1000  . insulin aspart  0-15 Units Subcutaneous TID WC  . insulin aspart  0-5 Units Subcutaneous QHS  . labetalol  200 mg Oral TID  . megestrol  80 mg Oral BID  . pneumococcal 23 valent  vaccine  0.5 mL Intramuscular Tomorrow-1000   Continuous Infusions: . sodium chloride    .  ceFAZolin (ANCEF) IV Stopped (09/18/19 2150)     LOS: 5 days        Shela Esses Gerome Apley, MD

## 2019-09-20 ENCOUNTER — Inpatient Hospital Stay (HOSPITAL_COMMUNITY): Payer: Medicare Other

## 2019-09-20 DIAGNOSIS — R609 Edema, unspecified: Secondary | ICD-10-CM

## 2019-09-20 LAB — BASIC METABOLIC PANEL
Anion gap: 14 (ref 5–15)
BUN: 36 mg/dL — ABNORMAL HIGH (ref 8–23)
CO2: 17 mmol/L — ABNORMAL LOW (ref 22–32)
Calcium: 8.8 mg/dL — ABNORMAL LOW (ref 8.9–10.3)
Chloride: 112 mmol/L — ABNORMAL HIGH (ref 98–111)
Creatinine, Ser: 1.87 mg/dL — ABNORMAL HIGH (ref 0.44–1.00)
GFR calc Af Amer: 32 mL/min — ABNORMAL LOW (ref >60)
GFR calc non Af Amer: 28 mL/min — ABNORMAL LOW (ref >60)
Glucose, Bld: 120 mg/dL — ABNORMAL HIGH (ref 70–99)
Potassium: 4 mmol/L (ref 3.5–5.1)
Sodium: 143 mmol/L (ref 135–145)

## 2019-09-20 LAB — CBC WITH DIFFERENTIAL/PLATELET
Abs Immature Granulocytes: 0.14 10*3/uL — ABNORMAL HIGH (ref 0.00–0.07)
Basophils Absolute: 0 10*3/uL (ref 0.0–0.1)
Basophils Relative: 0 %
Eosinophils Absolute: 0.2 10*3/uL (ref 0.0–0.5)
Eosinophils Relative: 1 %
HCT: 26.3 % — ABNORMAL LOW (ref 36.0–46.0)
Hemoglobin: 8 g/dL — ABNORMAL LOW (ref 12.0–15.0)
Immature Granulocytes: 1 %
Lymphocytes Relative: 5 %
Lymphs Abs: 0.7 10*3/uL (ref 0.7–4.0)
MCH: 26.2 pg (ref 26.0–34.0)
MCHC: 30.4 g/dL (ref 30.0–36.0)
MCV: 86.2 fL (ref 80.0–100.0)
Monocytes Absolute: 0.7 10*3/uL (ref 0.1–1.0)
Monocytes Relative: 5 %
Neutro Abs: 11.5 10*3/uL — ABNORMAL HIGH (ref 1.7–7.7)
Neutrophils Relative %: 88 %
Platelets: 125 10*3/uL — ABNORMAL LOW (ref 150–400)
RBC: 3.05 MIL/uL — ABNORMAL LOW (ref 3.87–5.11)
RDW: 22.6 % — ABNORMAL HIGH (ref 11.5–15.5)
WBC: 13.3 10*3/uL — ABNORMAL HIGH (ref 4.0–10.5)
nRBC: 0.6 % — ABNORMAL HIGH (ref 0.0–0.2)

## 2019-09-20 LAB — PROTEIN ELECTROPHORESIS, SERUM
A/G Ratio: 0.7 (ref 0.7–1.7)
Albumin ELP: 2.6 g/dL — ABNORMAL LOW (ref 2.9–4.4)
Alpha-1-Globulin: 0.5 g/dL — ABNORMAL HIGH (ref 0.0–0.4)
Alpha-2-Globulin: 0.6 g/dL (ref 0.4–1.0)
Beta Globulin: 0.8 g/dL (ref 0.7–1.3)
Gamma Globulin: 1.7 g/dL (ref 0.4–1.8)
Globulin, Total: 3.5 g/dL (ref 2.2–3.9)
M-Spike, %: 1.1 g/dL — ABNORMAL HIGH
Total Protein ELP: 6.1 g/dL (ref 6.0–8.5)

## 2019-09-20 LAB — SURGICAL PATHOLOGY

## 2019-09-20 LAB — GLUCOSE, CAPILLARY
Glucose-Capillary: 108 mg/dL — ABNORMAL HIGH (ref 70–99)
Glucose-Capillary: 118 mg/dL — ABNORMAL HIGH (ref 70–99)
Glucose-Capillary: 156 mg/dL — ABNORMAL HIGH (ref 70–99)
Glucose-Capillary: 150 mg/dL — ABNORMAL HIGH (ref 70–99)

## 2019-09-20 MED ORDER — JEVITY 1.2 CAL PO LIQD
237.0000 mL | Freq: Once | ORAL | Status: DC
  Filled 2019-09-20: qty 237

## 2019-09-20 NOTE — Progress Notes (Signed)
Occupational Therapy Treatment Patient Details Name: Vanessa [name] MRN: [id number] DOB: 04-14-1956 Today's Date: 09/20/2019    History of present illness Pt isa 64 y/o female with PMH of HTN, type 2 DM, asthma presenting to ED with vaginal bleeding, progressive SOB and weakness over the last 3 weeks. Admitted with severe symptomatic anemia. Found with thrombocytopenia. CT negative, Found with L proximal humeral fracture healing (not acute) per xray. 3/12 UE venous study ordered to r/o DVT   OT comments  This 64 yo female admitted with above seen today to focus on sit<>stand, stand pivot transfers, and LUE exercises (forearm and distally). Pt with increased ability for sit<>stand today and transfers. Pt not really tolerating her LUE in a dependent position (Dr. Cathlean Sauer ordered sling for comfort). Pt will continue to benefit from acute OT with follow up at SNF.  Follow Up Recommendations  SNF;Supervision/Assistance - 24 hour    Equipment Recommendations  3 in 1 bedside commode       Precautions / Restrictions Precautions Precautions: Fall Shoulder Interventions: Shoulder sling/immobilizer;For comfort Precaution Comments: L proximal humerus fx healing (not acute)- per chat text with Dr. Cathlean Sauer (09/20/2019) pt can use arm functionally and WBAT, sling for comfort Required Braces or Orthoses: Sling Restrictions Weight Bearing Restrictions: No       Mobility Bed Mobility               General bed mobility comments: Pt up in recliner upon arrival  Transfers Overall transfer level: Needs assistance Equipment used: 1 person hand held assist Transfers: Sit to/from Stand Sit to Stand: Min guard         General transfer comment: VCs for safe placement of RUE, did not have her use LUE (had not gotten clarification before entering room so had her not use LUE)    Balance Overall balance assessment: Needs assistance Sitting-balance support: Feet supported Sitting balance-Leahy  Scale: Fair     Standing balance support: Single extremity supported Standing balance-Leahy Scale: Poor                             ADL either performed or assessed with clinical judgement   ADL Overall ADL's : Needs assistance/impaired                         Toilet Transfer: Minimal assistance;Stand-pivot;BSC   Toileting- Clothing Manipulation and Hygiene: Total assistance Toileting - Clothing Manipulation Details (indicate cue type and reason): min A sit<>stand             Vision Baseline Vision/History: Wears glasses Wears Glasses: At all times Patient Visual Report: No change from baseline            Cognition Arousal/Alertness: Awake/alert Behavior During Therapy: WFL for tasks assessed/performed Overall Cognitive Status: Within Functional Limits for tasks assessed                                          Exercises Other Exercises Other Exercises: Edema noted from upper arm to hand. Had pt complete LUE AROM for forearm and distally           Pertinent Vitals/ Pain       Pain Assessment: 0-10 Pain Score: 8  Pain Location: L UE (shoulder) when arm in a dependent position Pain Descriptors / Indicators: Discomfort;Guarding;Aching;Sore;Grimacing Pain  Intervention(s): Limited activity within patient's tolerance;Monitored during session;Repositioned         Frequency  Min 2X/week        Progress Toward Goals  OT Goals(current goals can now be found in the care plan section)  Progress towards OT goals: Progressing toward goals     Plan Discharge plan remains appropriate       AM-PAC OT "6 Clicks" Daily Activity     Outcome Measure   Help from another person eating meals?: A Little(setup) Help from another person taking care of personal grooming?: A Little Help from another person toileting, which includes using toliet, bedpan, or urinal?: A Lot Help from another person bathing (including washing, rinsing,  drying)?: A Lot Help from another person to put on and taking off regular upper body clothing?: A Lot Help from another person to put on and taking off regular lower body clothing?: Total 6 Click Score: 13    End of Session    OT Visit Diagnosis: Other abnormalities of gait and mobility (R26.89);Muscle weakness (generalized) (M62.81);Pain;History of falling (Z91.81);Other symptoms and signs involving cognitive function Pain - Right/Left: Left Pain - part of body: Shoulder   Activity Tolerance Patient limited by pain(when LUE in a dependent position)   Patient Left in chair;with call bell/phone within reach;with chair alarm set           Time: 6503-5465 OT Time Calculation (min): 30 min  Charges: OT General Charges $OT Visit: 1 Visit OT Treatments $Self Care/Home Management : 8-22 mins $Therapeutic Exercise: 8-22 mins  Tye Maryland, OTR/L Acute Rehab Services Pager [phone] Office [phone]      09/20/2019, 12:14 PM

## 2019-09-20 NOTE — Progress Notes (Signed)
[facility] for Infectious Disease  Date of Admission:  09/14/2019     Total days of antibiotics 3         ASSESSMENT:  Ms. Mumpower had her endometrial biopsy completed yesterday. Repeat blood cultures have been without growth to date. Will order TEE to rule out endocarditis which will be scheduled for Monday.  Continue wound care per primary team. Discussed her ID plan of care to include prolonged IV therapy with Cefazolin for prolonged duration to be determined by TEE results. Will continue current dose of cefazolin. Postmenopausal bleeding has stopped.   PLAN:  1. Continue Cefazolin. 2. Await results of TEE on Monday 3. Hold PICC placement until cultures are cleared for at least 48 hours. 4. Continue wound care per primary team.   Dr. Megan Salon will be available this weekend as needed and ID will follow up with final recommendations on Monday.   Principal Problem:   MSSA bacteremia Active Problems:   Postmenopausal bleeding   Thrombocytopenia (HCC)   DM type 2 (diabetes mellitus, type 2) (HCC)   HTN (hypertension)   Symptomatic anemia   Generalized pruritus   Acute on chronic renal insufficiency   Hyperuricemia   Hypokalemia   . amLODipine  10 mg Oral Daily  . atorvastatin  40 mg Oral Daily  . diclofenac Sodium  2 g Topical QID  . ferrous sulfate  325 mg Oral BID WC  . influenza vaccine adjuvanted  0.5 mL Intramuscular Tomorrow-1000  . insulin aspart  0-15 Units Subcutaneous TID WC  . insulin aspart  0-5 Units Subcutaneous QHS  . labetalol  200 mg Oral TID  . megestrol  80 mg Oral BID  . pneumococcal 23 valent vaccine  0.5 mL Intramuscular Tomorrow-1000    SUBJECTIVE:  Afebrile overnight with no acute events. Feeling a little better today but not feeling well. Hoping to be discharged soon. Endometrial biopsy performed yesterday.   No Known Allergies   Review of Systems: Review of Systems  Constitutional: Positive for malaise/fatigue. Negative for chills,  fever and weight loss.  Respiratory: Negative for cough, shortness of breath and wheezing.   Cardiovascular: Negative for chest pain and leg swelling.  Gastrointestinal: Negative for abdominal pain, constipation, diarrhea, nausea and vomiting.  Skin: Negative for rash.      OBJECTIVE: Vitals:   09/19/19 1520 09/19/19 2057 09/20/19 0430 09/20/19 0630  BP: (!) 151/73 (!) 158/85 (!) 148/74   Pulse: 66 65 73   Resp: 17 15 19    Temp: 97.6 F (36.4 C) 97.6 F (36.4 C) (!) 97.2 F (36.2 C)   TempSrc: Oral Oral Oral   SpO2: 100% 98% 100%   Weight:    77.6 kg  Height:       Body mass index is 33.42 kg/m.  Physical Exam Constitutional:      General: She is not in acute distress.    Appearance: She is well-developed.     Comments: Seated in the chair; pleasant.   HENT:     Mouth/Throat:     Comments: Poor dentition Cardiovascular:     Rate and Rhythm: Normal rate and regular rhythm.     Heart sounds: Normal heart sounds.  Pulmonary:     Effort: Pulmonary effort is normal.     Breath sounds: Normal breath sounds.  Musculoskeletal:     Comments: Bilateral legs are wrapped and appear clean and dry.  Skin:    General: Skin is warm and dry.  Neurological:  Mental Status: She is alert and oriented to person, place, and time.  Psychiatric:        Behavior: Behavior normal.        Judgment: Judgment normal.     Lab Results Lab Results  Component Value Date   WBC 13.3 (H) 09/20/2019   HGB 8.0 (L) 09/20/2019   HCT 26.3 (L) 09/20/2019   MCV 86.2 09/20/2019   PLT 125 (L) 09/20/2019    Lab Results  Component Value Date   CREATININE 1.87 (H) 09/20/2019   BUN 36 (H) 09/20/2019   NA 143 09/20/2019   K 4.0 09/20/2019   CL 112 (H) 09/20/2019   CO2 17 (L) 09/20/2019    Lab Results  Component Value Date   ALT 15 09/17/2019   AST 14 (L) 09/17/2019   ALKPHOS 13 (L) 09/17/2019   BILITOT 0.8 09/17/2019     Microbiology: Recent Results (from the past 240 hour(s))  SARS  CORONAVIRUS 2 (TAT 6-24 HRS) Nasopharyngeal Nasopharyngeal Swab     Status: None   Collection Time: 09/14/19  5:25 PM   Specimen: Nasopharyngeal Swab  Result Value Ref Range Status   SARS Coronavirus 2 NEGATIVE NEGATIVE Final    Comment: (NOTE) SARS-CoV-2 target nucleic acids are NOT DETECTED. The SARS-CoV-2 RNA is generally detectable in upper and lower respiratory specimens during the acute phase of infection. Negative results do not preclude SARS-CoV-2 infection, do not rule out co-infections with other pathogens, and should not be used as the sole basis for treatment or other patient management decisions. Negative results must be combined with clinical observations, patient history, and epidemiological information. The expected result is Negative. Fact Sheet for Patients: SugarRoll.be Fact Sheet for Healthcare Providers: https://www.woods-mathews.com/ This test is not yet approved or cleared by the [place] FDA and  has been authorized for detection and/or diagnosis of SARS-CoV-2 by FDA under an Emergency Use Authorization (EUA). This EUA will remain  in effect (meaning this test can be used) for the duration of the COVID-19 declaration under Section 56 4(b)(1) of the Act, 21 U.S.C. section 360bbb-3(b)(1), unless the authorization is terminated or revoked sooner. Performed at [facility], [place] [address] [place], [place] [address]   Culture, blood (routine x 2)     Status: Abnormal   Collection Time: 09/17/19  8:40 AM   Specimen: BLOOD  Result Value Ref Range Status   Specimen Description BLOOD LEFT ANTECUBITAL  Final   Special Requests   Final    BOTTLES DRAWN AEROBIC ONLY Blood Culture results may not be optimal due to an inadequate volume of blood received in culture bottles   Culture  Setup Time   Final    AEROBIC BOTTLE ONLY GRAM POSITIVE COCCI IN CLUSTERS CRITICAL RESULT CALLED TO, READ BACK BY AND VERIFIED WITH: Shellee Milo [facility] 09/17/19 2358 JDW Performed at [facility], [place] [address], [place], [place] [address]    Culture STAPHYLOCOCCUS AUREUS (A)  Final   Report Status 09/19/2019 FINAL  Final   Organism ID, Bacteria STAPHYLOCOCCUS AUREUS  Final      Susceptibility   Staphylococcus aureus - MIC*    CIPROFLOXACIN <=0.5 SENSITIVE Sensitive     ERYTHROMYCIN <=0.25 SENSITIVE Sensitive     GENTAMICIN <=0.5 SENSITIVE Sensitive     OXACILLIN <=0.25 SENSITIVE Sensitive     TETRACYCLINE <=1 SENSITIVE Sensitive     VANCOMYCIN 1 SENSITIVE Sensitive     TRIMETH/SULFA <=10 SENSITIVE Sensitive     CLINDAMYCIN <=0.25  SENSITIVE Sensitive     RIFAMPIN <=0.5 SENSITIVE Sensitive     Inducible Clindamycin NEGATIVE Sensitive     * STAPHYLOCOCCUS AUREUS  Blood Culture ID Panel (Reflexed)     Status: Abnormal   Collection Time: 09/17/19  8:40 AM  Result Value Ref Range Status   Enterococcus species NOT DETECTED NOT DETECTED Final   Listeria monocytogenes NOT DETECTED NOT DETECTED Final   Staphylococcus species DETECTED (A) NOT DETECTED Final    Comment: CRITICAL RESULT CALLED TO, READ BACK BY AND VERIFIED WITH: L SEAY PHARMD 09/17/19 2358 JDW    Staphylococcus aureus (BCID) DETECTED (A) NOT DETECTED Final    Comment: Methicillin (oxacillin) susceptible Staphylococcus aureus (MSSA). Preferred therapy is anti staphylococcal beta lactam antibiotic (Cefazolin or Nafcillin), unless clinically contraindicated. CRITICAL RESULT CALLED TO, READ BACK BY AND VERIFIED WITH: L SEAY PHARMD 09/17/19 2358 JDW    Methicillin resistance NOT DETECTED NOT DETECTED Final   Streptococcus species NOT DETECTED NOT DETECTED Final   Streptococcus agalactiae NOT DETECTED NOT DETECTED Final   Streptococcus pneumoniae NOT DETECTED NOT DETECTED Final   Streptococcus pyogenes NOT DETECTED NOT DETECTED Final   Acinetobacter baumannii NOT DETECTED NOT DETECTED Final   Enterobacteriaceae species NOT DETECTED NOT DETECTED Final   Enterobacter  cloacae complex NOT DETECTED NOT DETECTED Final   Escherichia coli NOT DETECTED NOT DETECTED Final   Klebsiella oxytoca NOT DETECTED NOT DETECTED Final   Klebsiella pneumoniae NOT DETECTED NOT DETECTED Final   Proteus species NOT DETECTED NOT DETECTED Final   Serratia marcescens NOT DETECTED NOT DETECTED Final   Haemophilus influenzae NOT DETECTED NOT DETECTED Final   Neisseria meningitidis NOT DETECTED NOT DETECTED Final   Pseudomonas aeruginosa NOT DETECTED NOT DETECTED Final   Candida albicans NOT DETECTED NOT DETECTED Final   Candida glabrata NOT DETECTED NOT DETECTED Final   Candida krusei NOT DETECTED NOT DETECTED Final   Candida parapsilosis NOT DETECTED NOT DETECTED Final   Candida tropicalis NOT DETECTED NOT DETECTED Final    Comment: Performed at [facility], [place]. [address] [place], [place] [address]  Culture, blood (routine x 2)     Status: Abnormal   Collection Time: 09/17/19  8:47 AM   Specimen: BLOOD  Result Value Ref Range Status   Specimen Description BLOOD RIGHT ANTECUBITAL  Final   Special Requests   Final    BOTTLES DRAWN AEROBIC ONLY Blood Culture results may not be optimal due to an inadequate volume of blood received in culture bottles   Culture  Setup Time   Final    AEROBIC BOTTLE ONLY GRAM POSITIVE COCCI IN CLUSTERS CRITICAL VALUE NOTED.  VALUE IS CONSISTENT WITH PREVIOUSLY REPORTED AND CALLED VALUE.    Culture (A)  Final    STAPHYLOCOCCUS AUREUS SUSCEPTIBILITIES PERFORMED ON PREVIOUS CULTURE WITHIN THE LAST 5 DAYS. Performed at [facility], [place] [address] [place], [place] [address]    Report Status 09/19/2019 FINAL  Final  Culture, Urine     Status: Abnormal   Collection Time: 09/18/19 12:15 PM   Specimen: Urine, Random  Result Value Ref Range Status   Specimen Description URINE, RANDOM  Final   Special Requests NONE  Final   Culture (A)  Final    <10,000 COLONIES/mL INSIGNIFICANT GROWTH Performed at [facility], [place]  [address], [place], [place] [address]    Report Status 09/19/2019 FINAL  Final  Culture, blood (routine x 2)     Status: None (Preliminary result)  Collection Time: 09/19/19  2:16 AM   Specimen: BLOOD  Result Value Ref Range Status   Specimen Description BLOOD LEFT FOREARM  Final   Special Requests   Final    BOTTLES DRAWN AEROBIC AND ANAEROBIC Blood Culture adequate volume   Culture   Final    NO GROWTH 1 DAY Performed at [facility], [place] [address] [place], [place] [address]    Report Status PENDING  Incomplete  Culture, blood (routine x 2)     Status: None (Preliminary result)   Collection Time: 09/19/19  2:23 AM   Specimen: BLOOD  Result Value Ref Range Status   Specimen Description BLOOD LEFT FOREARM  Final   Special Requests   Final    BOTTLES DRAWN AEROBIC ONLY Blood Culture results may not be optimal due to an inadequate volume of blood received in culture bottles   Culture   Final    NO GROWTH 1 DAY Performed at [facility], [place] [address], [place], [place] [address]    Report Status PENDING  Incomplete     Terri Piedra, [place] [facility] [phone] Pager  09/20/2019  11:10 AM

## 2019-09-20 NOTE — Progress Notes (Signed)
   [facility] has been requested to perform a transesophageal echocardiogram on [facility] for bacteremia.  After careful review of history and examination, the risks and benefits of transesophageal echocardiogram have been explained including risks of esophageal damage, perforation (1:10,000 risk), bleeding, pharyngeal hematoma as well as other potential complications associated with conscious sedation including aspiration, arrhythmia, respiratory failure and death. Alternatives to treatment were discussed, questions were answered. Patient denies any dysphagia or prior radiation to chest. Patient is willing to proceed.   Procedure scheduled for Monday 09/23/2019 at 12:30pm with Dr. Meda Coffee.   Of note, anemic and thrombocytopenic but looks like she is starting to improve. Patient presented with vaginal bleeding and was found to have a hemoglobin of 4.8. S/p transfusion or PRBCs. She has been diagnosed with endometrial adenocarcinoma. Hemoglobin stable today at 8.0. Platelets have also been low and were as low as 48,000 and 09/17/2019. This has been felt to be due to heavy vaginal bleeding and bacteremia. Platelets are improving and are 125,000 today. Will recheck CBC on Monday morning to make sure hemoglobin and platelets are stable.   Darreld Mclean, PA-C 09/20/2019 1:06 PM

## 2019-09-20 NOTE — Progress Notes (Signed)
Physical Therapy Treatment Patient Details Name: Vanessa [name] MRN: [id number] DOB: January 01, 1956 Today's Date: 09/20/2019    History of Present Illness Pt isa 64 y/o female with PMH of HTN, type 2 DM, asthma presenting to ED with vaginal bleeding, progressive SOB and weakness over the last 3 weeks. Admitted with severe symptomatic anemia. Found with thrombocytopenia. CT negative, Found with L proximal humeral fracture healing (not acute) per xray. 3/12 UE venous study ordered to r/o DVT    PT Comments    Pt OOB in recliner upon arrival of PT, agreeable to participate in PT session with focus on progressing mobility. The pt continues to present with limitations in functional mobility and activity tolerance compared to their prior level of function and independence due to above dx, but demos sig improvements in mobility, independence, and endurance compared to previous session. The pt was able to transfer with minG for safety and without AD, and was able to ambulate a short distance in the room with minG for safety. I recommend progression of future sessions to train on use of a cane for ambulation, and could possibly progress pt to d/c of home with HHPT if supervision could be arranged during the day (spouse works). PT will continue to follow to progress mobility and activity tolerance.     Follow Up Recommendations  SNF;Supervision/Assistance - 24 hour     Equipment Recommendations  Other (comment)(TBD)    Recommendations for Other Services       Precautions / Restrictions Precautions Precautions: Fall Shoulder Interventions: Shoulder sling/immobilizer;For comfort Precaution Comments: L proximal humerus fx healing (not acute)- per chat text with Dr. Cathlean Sauer (09/20/2019) pt can use arm functionally and WBAT, sling for comfort Required Braces or Orthoses: Sling Restrictions Weight Bearing Restrictions: Yes LUE Weight Bearing: Weight bearing as tolerated Other Position/Activity  Restrictions: L shoulder pain with xray finding proximal humerus fx healing (not acute)- per chat text with Dr. Cathlean Sauer (09/20/2019) pt can use arm functionally and WBAT, sling for comfort    Mobility  Bed Mobility Overal bed mobility: Needs Assistance Bed Mobility: Sit to Supine       Sit to supine: Min assist   General bed mobility comments: pt in recliner upon arrival, required minA of BLE to return to bed  Transfers Overall transfer level: Needs assistance Equipment used: None Transfers: Sit to/from Stand Sit to Stand: Min guard         General transfer comment: VCs for placement of RUE, LUE in sling, pt able to complete 5xSTS in 93 sec without use of RW  Ambulation/Gait   Gait Distance (Feet): 10 Feet Assistive device: Rolling walker (2 wheeled)(unable to locate cane on 6N, pt used RUE on RW, LUE in sling for comfort) Gait Pattern/deviations: Step-through pattern;Decreased stride length   Gait velocity interpretation: <1.8 ft/sec, indicate of risk for recurrent falls General Gait Details: pt able to ambulate from recliner to bed with minA for RW management but minG for safety   Stairs             Wheelchair Mobility    Modified Rankin (Stroke Patients Only)       Balance Overall balance assessment: Needs assistance   Sitting balance-Leahy Scale: Good Sitting balance - Comments: pt could sit with both hands in her lap, but preferred to rest propped on her R elbow on the rail.   Standing balance support: Single extremity supported Standing balance-Leahy Scale: Poor Standing balance comment: pt relies on RUE support to ambulate  Cognition Arousal/Alertness: Awake/alert Behavior During Therapy: WFL for tasks assessed/performed Overall Cognitive Status: Within Functional Limits for tasks assessed                                        Exercises      General Comments General comments (skin  integrity, edema, etc.): educated NT on WB restrictions and LUE positioning recommendations      Pertinent Vitals/Pain Pain Assessment: Faces Faces Pain Scale: Hurts little more Pain Location: L UE (shoulder) when arm in a dependent position Pain Descriptors / Indicators: Discomfort;Guarding;Aching;Sore;Grimacing Pain Intervention(s): Limited activity within patient's tolerance;Monitored during session;Repositioned    Home Living                      Prior Function            PT Goals (current goals can now be found in the care plan section) Acute Rehab PT Goals Patient Stated Goal: less pain: PT Goal Formulation: With patient Time For Goal Achievement: 10/02/19 Potential to Achieve Goals: Good Progress towards PT goals: Progressing toward goals    Frequency    Min 3X/week      PT Plan Current plan remains appropriate    Co-evaluation              AM-PAC PT "6 Clicks" Mobility   Outcome Measure  Help needed turning from your back to your side while in a flat bed without using bedrails?: A Little Help needed moving from lying on your back to sitting on the side of a flat bed without using bedrails?: A Little Help needed moving to and from a bed to a chair (including a wheelchair)?: A Little Help needed standing up from a chair using your arms (e.g., wheelchair or bedside chair)?: A Little Help needed to walk in hospital room?: A Little Help needed climbing 3-5 steps with a railing? : A Lot 6 Click Score: 17    End of Session Equipment Utilized During Treatment: Gait belt(sling LUE) Activity Tolerance: Patient limited by fatigue;Patient tolerated treatment well Patient left: in bed;with call bell/phone within reach;with bed alarm set;with nursing/sitter in room Nurse Communication: Mobility status;Weight bearing status PT Visit Diagnosis: Unsteadiness on feet (R26.81);Muscle weakness (generalized) (M62.81);Difficulty in walking, not elsewhere  classified (R26.2);Pain Pain - Right/Left: Left Pain - part of body: Arm     Time: 2956-2130 PT Time Calculation (min) (ACUTE ONLY): 21 min  Charges:  $Gait Training: 8-22 mins                     Karma Ganja, PT, DPT   Acute Rehabilitation Department Pager #: [phone]   Otho Bellows 09/20/2019, 4:45 PM

## 2019-09-20 NOTE — Progress Notes (Signed)
Brief oncology note:  The patient's chart has been reviewed.  Her platelet count was up to 125,000. Thrombocytopenia was due to underlying bacteremia.  No additional work-up or treatment is indicated.  Pathology from endometrial biopsy showed endometrial adenocarcinoma.  Patient was previously notified of these results by gynecology.  The patient has been referred to GYN oncology and has an appointment on 09/24/2019 as an outpatient.  Medical oncology will sign off at this point.  Please call for questions.  Mikey Bussing, DNP, AGPCNP-BC, AOCNP

## 2019-09-20 NOTE — Progress Notes (Signed)
Orthopedic Tech Progress Note Patient Details:  Vanessa [name] Feb 04, 1956 [id number]  Ortho Devices Type of Ortho Device: Arm sling Ortho Device/Splint Location: left Ortho Device/Splint Interventions: Application   Post Interventions Patient Tolerated: Well Instructions Provided: Care of device   [name] 09/20/2019, 12:18 PM

## 2019-09-20 NOTE — Progress Notes (Addendum)
PROGRESS NOTE    Vanessa [name]  IWL:[id number] DOB: January 06, 1956 DOA: 09/14/2019 PCP: Maren Reamer, MD (Inactive)    Brief Narrative:  Patient was admitted to the hospital with a working diagnosis of symptomatic anemia in the setting of transvaginal bleeding newly diagnosed endometrial cancer. Hospitalization complicated with MSSA bacteremia  64 year old female who presented with vaginal bleeding. She does have significant past medical history for hypertension, type 2 diabetes mellitus and asthma. Patient reportedno bleeding for several months. Positive dyspnea over the last 3 weeks prior to hospitalization. Physical examination her blood pressure was 179/79, heart rate 79, respiratory rate 19, temperature 97.7, oxygenation 100%,her lungs are clear to auscultation bilaterally, heart is also present rhythmic, soft abdomen, +2+ lower extremity edema. Sodium 140, potassium 3.1, chloride 112, bicarb 18, glucose 113, BUN 27, creatinine 1.9, white count 6.8, hemoglobin 4.8, hematocrit 17.6, platelets 115.  Patient has received total of 3 units packed red blood cells,1 dose of Feraheme. Her vaginal ultrasound had thickened endometrium. Patient has been placed on Megace perGYN service.  Patient underwent inpatient endometrial biopsy, pathology positive for endometrial cancer.   On 03/09 #2/2 bottles blood cultures resulted positive for MSSA, follow up work up with echocardiogram showed no vegetations, patient placed on IV cefazolin. Pending results of follow up blood cultures.   Cardiology has been consulted for TEE, plan for 09/23/19.    Assessment & Plan:   Principal Problem:   MSSA bacteremia Active Problems:   DM type 2 (diabetes mellitus, type 2) (HCC)   HTN (hypertension)   Symptomatic anemia   Postmenopausal bleeding   Thrombocytopenia (HCC)   Generalized pruritus   Acute on chronic renal insufficiency   Hyperuricemia   Hypokalemia   1. Severe symptomatic  anemia due to acute on chronic blood loss due to vaginal bleeding/ thrombocytopenia/ iron deficiency/ new diagnosed endometrial adenocarcinoma. Stable Hgb today at 8.0 with Hct at 26.3 and Plt up to 125. No clinical signs of persistent bleeding.   Endometrial biopsy positive for adenocarcinoma, will need outpatient follow up with GYN. Will plan to continue megace 800 mg bid until follow up as outpatient.   Continue with iron supplementation.   2. HTN.  Blood pressure today at 153/77 mmHg, will continue blood pressure control with amlodipine and labetalol.  3. T2DM/ dyslipidemia . Fasting glucose this am is 120 mg/dl. Continue glucose cover and monitoring with insulin sliding scale. Patient continue to have poor oral intake.   On atorvastatin.   4. CKD stage 3b with non gap metabolic acidosis. Stable renal function with serum cr at 1,8 and K at 4,0 and serum bicarbonate at 17.   Avoid hypotension and nephrotoxic medications. Hold on IV fluids for now and continue to encourage po intake.   5. Obesity class 2, healing proximal left humeral fracture.  BMI is 32.C Continue pain control with oxycodone and topical diclofenac. Sling support for comfort and continue physical therapy as tolerated. Will check left upper extremity US rule out DVT.   6. New leukocytosis with MSSA bacteremia.  transthoracic echocardiography with no vegetations. Continue with cefazolin IV, follow blood cultures with no growth, plan for TEE on 03.15.21. Patient will need PICC line when bacteremia resolves, confirmed by follow blood cultures.   DVT prophylaxis:scd Code Status:full Family Communication:no family at the bedside  Disposition Plan/ discharge barriers:patient from home, barrier for dc severe decondition, bacteremia, requiring IV antibiotic therapy and further advanced diagnostics with TEE.   Consultants:    ID  GYN  Oncology  Procedures:   Endometrial biopsy   Antimicrobials:    Cefazolin     Subjective: Patient is feeling better, her left shoulder pain is better controlled along with her touch ache, no nausea or vomiting, no chest pain or dyspnea.   Objective: Vitals:   09/19/19 1520 09/19/19 2057 09/20/19 0430 09/20/19 0630  BP: (!) 151/73 (!) 158/85 (!) 148/74   Pulse: 66 65 73   Resp: 17 15 19    Temp: 97.6 F (36.4 C) 97.6 F (36.4 C) (!) 97.2 F (36.2 C)   TempSrc: Oral Oral Oral   SpO2: 100% 98% 100%   Weight:    77.6 kg  Height:        Intake/Output Summary (Last 24 hours) at 09/20/2019 1345 Last data filed at 09/20/2019 0519 Gross per 24 hour  Intake 340 ml  Output 200 ml  Net 140 ml   Filed Weights   09/18/19 0500 09/19/19 0422 09/20/19 0630  Weight: 75.3 kg 74.4 kg 77.6 kg    Examination:   General: Not in pain or dyspnea. Deconditioned  Neurology: Awake and alert, non focal  E ENT: mild pallor, no icterus, oral mucosa moist Cardiovascular: No JVD. S1-S2 present, rhythmic, no gallops, rubs, or murmurs. Left upper lower extremity edema, ++ non pitting. Pulmonary: positive breath sounds bilaterally, adequate air movement, no wheezing, rhonchi or rales. Gastrointestinal. Abdomen with no organomegaly, non tender, no rebound or guarding Skin. No rashes Musculoskeletal: no joint deformities/ decreased range of motion left upper extremity due to pain.      Data Reviewed: I have personally reviewed following labs and imaging studies  CBC: Recent Labs  Lab 09/16/19 0139 09/16/19 0139 09/16/19 1612 09/17/19 0317 09/17/19 0843 09/18/19 0255 09/19/19 0220 09/20/19 0840  WBC 6.5  --   --  15.1*  --  14.8* 16.3* 13.3*  NEUTROABS 5.3  --   --  14.2*  --  13.9* 15.0* 11.5*  HGB 6.3*   < > 8.4* 7.6*  --  7.4* 7.4* 8.0*  HCT 20.4*   < > 26.5* 24.7*  --  23.8* 24.7* 26.3*  MCV 81.3  --   --  82.6  --  83.5 85.2 86.2  PLT 55*   < >  --  48* 52* 55* 79* 125*   < > = values in this interval not displayed.   Basic Metabolic  Panel: Recent Labs  Lab 09/15/19 0616 09/15/19 0616 09/16/19 0139 09/17/19 0317 09/18/19 0255 09/19/19 0220 09/20/19 0329  NA 140   < > 142 144 142 140 143  K 2.7*   < > 3.5 4.1 4.4 4.5 4.0  CL 109   < > 113* 115* 115* 112* 112*  CO2 19*   < > 19* 18* 18* 16* 17*  GLUCOSE 92   < > 126* 137* 154* 103* 120*  BUN 23   < > 27* 24* 30* 33* 36*  CREATININE 1.78*   < > 2.02* 1.97* 2.12* 2.01* 1.87*  CALCIUM 8.3*   < > 8.3* 8.7* 8.8* 8.8* 8.8*  MG 2.1  --  2.2  --   --   --   --   PHOS 3.9  --  3.3  --   --   --   --    < > = values in this interval not displayed.   GFR: Estimated Creatinine Clearance: 27.6 mL/min (A) (by C-G formula based on SCr of 1.87 mg/dL (H)). Liver Function Tests: Recent Labs  Lab 09/14/19 1620  09/17/19 0317  AST 31 14*  ALT 19 15  ALKPHOS 80 13*  BILITOT 0.6 0.8  PROT 7.4 6.8  ALBUMIN 3.0* 2.6*   No results for input(s): LIPASE, AMYLASE in the last 168 hours. No results for input(s): AMMONIA in the last 168 hours. Coagulation Profile: Recent Labs  Lab 09/14/19 1725 09/17/19 0843  INR 1.1 1.1   Cardiac Enzymes: No results for input(s): CKTOTAL, CKMB, CKMBINDEX, TROPONINI in the last 168 hours. BNP (last 3 results) No results for input(s): PROBNP in the last 8760 hours. HbA1C: No results for input(s): HGBA1C in the last 72 hours. CBG: Recent Labs  Lab 09/19/19 1242 09/19/19 1725 09/19/19 2100 09/20/19 0813 09/20/19 1230  GLUCAP 118* 114* 98 108* 118*   Lipid Profile: No results for input(s): CHOL, HDL, LDLCALC, TRIG, CHOLHDL, LDLDIRECT in the last 72 hours. Thyroid Function Tests: No results for input(s): TSH, T4TOTAL, FREET4, T3FREE, THYROIDAB in the last 72 hours. Anemia Panel: No results for input(s): VITAMINB12, FOLATE, FERRITIN, TIBC, IRON, RETICCTPCT in the last 72 hours.    Radiology Studies: I have reviewed all of the imaging during this hospital visit personally     Scheduled Meds: . amLODipine  10 mg Oral Daily  .  atorvastatin  40 mg Oral Daily  . diclofenac Sodium  2 g Topical QID  . ferrous sulfate  325 mg Oral BID WC  . influenza vaccine adjuvanted  0.5 mL Intramuscular Tomorrow-1000  . insulin aspart  0-15 Units Subcutaneous TID WC  . insulin aspart  0-5 Units Subcutaneous QHS  . labetalol  200 mg Oral TID  . megestrol  80 mg Oral BID  . pneumococcal 23 valent vaccine  0.5 mL Intramuscular Tomorrow-1000   Continuous Infusions: . sodium chloride    .  ceFAZolin (ANCEF) IV 2 g (09/20/19 1016)     LOS: 6 days        Eluzer Howdeshell Gerome Apley, MD

## 2019-09-20 NOTE — TOC Progression Note (Addendum)
Transition of Care [facility]) - Progression Note    Patient Details  Name: Vanessa [name] MRN: [id number] Date of Birth: 02-14-1956  [facility] CM/SW Contact  Jacalyn Lefevre Edson Snowball, RN Phone Number: 09/20/2019, 12:01 PM  Clinical Narrative:     Will update Freda Munro at [facility] . Patient will be here through weekend , TEE on Monday    Updated Freda Munro at [facility] including [place] results.   Expected Discharge Plan: [place] Barriers to Discharge: Continued Medical Work up  Expected Discharge Plan and Services Expected Discharge Plan: [place]   Discharge Planning Services: CM Consult Post Acute Care Choice: [place] Living arrangements for the past 2 months: Apartment                   DME Agency: NA       HH Arranged: NA           Social Determinants of Health (SDOH) Interventions    Readmission Risk Interventions No flowsheet data found.

## 2019-09-20 NOTE — Progress Notes (Addendum)
Subjective: Patient reports minimal vaginal spotting after endometrial biopsy.  No significant or ongoing bleeding.  She is anxious to leave the hospital.  Objective: I have reviewed patient's vital signs and pathology.  General: alert, cooperative, appears stated age and no distress Sitting up in chair, ordering meal.   Assessment/Plan: 64 yo woman with postmenopausal bleeding -- Pathology report returned today with endometrial adenocarcinoma, FIGO II -- Patient informed of results, plan to arrange outpatient follow up with GYN/ONC -- Patient is in relatively good spirits despite diagnosis, all questions answered, anticipates treatment course to be determined in consultation with GYN/ONC. -- Spoke with GYN ONC [phone]), appointment will be made and placed in inpatient chart for patient to follow up after discharge. Dr. Berline Lopes is today's attending in clinic. -- Patient is ok to continue megace 80mg  BID until seen by GYN/ONC to prevent further excessive bleeding while awaiting treatment. -- Will sign off.  Please contact if patient has any further excessive bleeding or other GYN needs.    LOS: 6 days    Debbrah Alar 09/20/2019, 12:48 PM

## 2019-09-21 DIAGNOSIS — C541 Malignant neoplasm of endometrium: Secondary | ICD-10-CM

## 2019-09-21 DIAGNOSIS — D500 Iron deficiency anemia secondary to blood loss (chronic): Secondary | ICD-10-CM

## 2019-09-21 DIAGNOSIS — S42302A Unspecified fracture of shaft of humerus, left arm, initial encounter for closed fracture: Secondary | ICD-10-CM

## 2019-09-21 LAB — CBC WITH DIFFERENTIAL/PLATELET
Abs Immature Granulocytes: 0.2 10*3/uL — ABNORMAL HIGH (ref 0.00–0.07)
Basophils Absolute: 0 10*3/uL (ref 0.0–0.1)
Basophils Relative: 0 %
Eosinophils Absolute: 0.2 10*3/uL (ref 0.0–0.5)
Eosinophils Relative: 1 %
HCT: 25.7 % — ABNORMAL LOW (ref 36.0–46.0)
Hemoglobin: 7.9 g/dL — ABNORMAL LOW (ref 12.0–15.0)
Immature Granulocytes: 1 %
Lymphocytes Relative: 6 %
Lymphs Abs: 0.8 10*3/uL (ref 0.7–4.0)
MCH: 26.1 pg (ref 26.0–34.0)
MCHC: 30.7 g/dL (ref 30.0–36.0)
MCV: 84.8 fL (ref 80.0–100.0)
Monocytes Absolute: 0.9 10*3/uL (ref 0.1–1.0)
Monocytes Relative: 6 %
Neutro Abs: 12.1 10*3/uL — ABNORMAL HIGH (ref 1.7–7.7)
Neutrophils Relative %: 86 %
Platelets: 194 10*3/uL (ref 150–400)
RBC: 3.03 MIL/uL — ABNORMAL LOW (ref 3.87–5.11)
RDW: 22.5 % — ABNORMAL HIGH (ref 11.5–15.5)
WBC: 14.2 10*3/uL — ABNORMAL HIGH (ref 4.0–10.5)
nRBC: 0.4 % — ABNORMAL HIGH (ref 0.0–0.2)

## 2019-09-21 LAB — BASIC METABOLIC PANEL
Anion gap: 11 (ref 5–15)
BUN: 30 mg/dL — ABNORMAL HIGH (ref 8–23)
CO2: 17 mmol/L — ABNORMAL LOW (ref 22–32)
Calcium: 8.3 mg/dL — ABNORMAL LOW (ref 8.9–10.3)
Chloride: 112 mmol/L — ABNORMAL HIGH (ref 98–111)
Creatinine, Ser: 1.78 mg/dL — ABNORMAL HIGH (ref 0.44–1.00)
GFR calc Af Amer: 34 mL/min — ABNORMAL LOW (ref >60)
GFR calc non Af Amer: 29 mL/min — ABNORMAL LOW (ref >60)
Glucose, Bld: 150 mg/dL — ABNORMAL HIGH (ref 70–99)
Potassium: 3.9 mmol/L (ref 3.5–5.1)
Sodium: 140 mmol/L (ref 135–145)

## 2019-09-21 LAB — GLUCOSE, CAPILLARY
Glucose-Capillary: 123 mg/dL — ABNORMAL HIGH (ref 70–99)
Glucose-Capillary: 78 mg/dL (ref 70–99)
Glucose-Capillary: 103 mg/dL — ABNORMAL HIGH (ref 70–99)
Glucose-Capillary: 108 mg/dL — ABNORMAL HIGH (ref 70–99)

## 2019-09-21 MED ORDER — POLYETHYLENE GLYCOL 3350 17 G PO PACK
17.0000 g | PACK | Freq: Two times a day (BID) | ORAL | Status: DC
  Administered 2019-09-22 – 2019-09-24 (×6): 17 g via ORAL
  Filled 2019-09-21 (×7): qty 1

## 2019-09-21 MED ORDER — BENZOCAINE 10 % MT GEL
1.0000 "application " | Freq: Four times a day (QID) | OROMUCOSAL | Status: DC | PRN
  Administered 2019-09-21: 1 via OROMUCOSAL
  Filled 2019-09-21: qty 9

## 2019-09-21 NOTE — Progress Notes (Signed)
PROGRESS NOTE    Vanessa [name]  VHQ:[id number] DOB: 1955-10-20 DOA: 09/14/2019 PCP: Maren Reamer, MD (Inactive)    Brief Narrative:  Patient was admitted to the hospital with a working diagnosis of symptomatic anemia in the setting of transvaginal bleeding newly diagnosed endometrial cancer.Hospitalization complicated with MSSA bacteremia  64 year old female who presented with vaginal bleeding. She does have significant past medical history for hypertension, type 2 diabetes mellitus and asthma. Patient reportedno bleeding for several months. Positive dyspnea over the last 3 weeks prior to hospitalization. Physical examination her blood pressure was 179/79, heart rate 79, respiratory rate 19, temperature 97.7, oxygenation 100%,her lungs are clear to auscultation bilaterally, heart is also present rhythmic, soft abdomen, +2+ lower extremity edema. Sodium 140, potassium 3.1, chloride 112, bicarb 18, glucose 113, BUN 27, creatinine 1.9, white count 6.8, hemoglobin 4.8, hematocrit 17.6, platelets 115.  Patient has received total of 3 units packed red blood cells,1 dose of Feraheme. Her vaginal ultrasound had thickened endometrium. Patient has been placed on Megace perGYN service.  Patient underwent inpatient endometrial biopsy, pathology positive for endometrial cancer.   On 03/09 #2/2 bottles blood cultures resulted positive for MSSA, follow up work up with echocardiogram showed no vegetations, patient placed on IV cefazolin. Pending results of follow up blood cultures.  Cardiology has been consulted for TEE, plan for 09/23/19.    Assessment & Plan:   Principal Problem:   MSSA bacteremia Active Problems:   DM type 2 (diabetes mellitus, type 2) (HCC)   HTN (hypertension)   Postmenopausal bleeding   Thrombocytopenia (HCC)   Acute on chronic renal insufficiency   Hypokalemia   Endometrial adenocarcinoma (HCC)   Left humeral fracture   Iron deficiency anemia due to  chronic blood loss   1. Severe symptomatic anemia due to acute on chronic blood loss due to vaginal bleeding/ thrombocytopenia/ iron deficiency/ new diagnosed endometrial adenocarcinoma.StableHgb todayat7,9 with Hct at 25.7and Plt continue to increase now up to 194.No further bleeding.  On Megace 800 mg bid with good toleration, will follow with GYN oncology as outpatient.   On iron supplementation.   2. HTN.continue with amlodipine and labetalol, for blood pressure control.   3. T2DM/ dyslipidemia .Fasting glucose this am is 150 mg/dl. On insulin sliding scale for glucose cover and monitoring.   Continue with atorvastatin.  4. 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 Will add oral bicarb tid.    Continue to hold on IV fluids,   5. Obesity class 2, healing proximal left humeral fracture.  BMI is 32.C Pain control with oxycodone and topical diclofenac. Continue with sling support for comfort and continue physical therapy as tolerated.   Left upper extremity US negative for DVT, but positive superficial vein thrombosis involving the left cephalic vein.   6. MSSA bacteremia. transthoracic echocardiography with no vegetations.  Follow up blood cultures from 03/11 continue with no growth.   On cefazolin IV, with plan for TEE on 03.15.21. Likely patient will need 2 weeks of antibiotic therapy if no valvular vegetations. Patient will need picc line before discharge.   DVT prophylaxis:scd Code Status:full Family Communication:no family at the bedside  Disposition Plan/ discharge barriers:patient from home, barrier for dc severe decondition, bacteremia, requiring IV antibiotic therapy and further advanced diagnostics with TEE.  Consultants:    ID  GYN  Oncology   Procedures:   Endometrial biopsy   Antimicrobials:  Cefazolin     Subjective: Patient is  feeling well, no nausea or vomiting, no chest pain or dyspnea, left upper extremity pain is better controlled.   Objective: Vitals:   09/20/19 1638 09/20/19 2049 09/21/19 0409 09/21/19 0500  BP:  (!) 149/76 (!) 154/76   Pulse:  71 68   Resp:   16   Temp:  99 F (37.2 C)    TempSrc:  Oral    SpO2: 100% 100% 100%   Weight:    76.8 kg  Height:        Intake/Output Summary (Last 24 hours) at 09/21/2019 1350 Last data filed at 09/21/2019 0800 Gross per 24 hour  Intake 240 ml  Output --  Net 240 ml   Filed Weights   09/19/19 0422 09/20/19 0630 09/21/19 0500  Weight: 74.4 kg 77.6 kg 76.8 kg    Examination:   General: Not in pain or dyspnea. Deconditioned  Neurology: Awake and alert, non focal  E ENT: mild pallor, no icterus, oral mucosa moist Cardiovascular: No JVD. S1-S2 present, rhythmic, no gallops, rubs, or murmurs. Left upper extremity edema non pitting edema. Pulmonary: positive breath sounds bilaterally, adequate air movement, no wheezing, rhonchi or rales. Gastrointestinal. Abdomen with no organomegaly, non tender, no rebound or guarding Skin. No rashes Musculoskeletal: no joint deformities     Data Reviewed: I have personally reviewed following labs and imaging studies  CBC: Recent Labs  Lab 09/17/19 0317 09/17/19 0317 09/17/19 0843 09/18/19 0255 09/19/19 0220 09/20/19 0840 09/21/19 0242  WBC 15.1*  --   --  14.8* 16.3* 13.3* 14.2*  NEUTROABS 14.2*  --   --  13.9* 15.0* 11.5* 12.1*  HGB 7.6*  --   --  7.4* 7.4* 8.0* 7.9*  HCT 24.7*  --   --  23.8* 24.7* 26.3* 25.7*  MCV 82.6  --   --  83.5 85.2 86.2 84.8  PLT 48*   < > 52* 55* 79* 125* 194   < > = values in this interval not displayed.   Basic Metabolic Panel: Recent Labs  Lab 09/15/19 0616 09/15/19 0616 09/16/19 0139 09/16/19 0139 09/17/19 0317 09/18/19 0255 09/19/19 0220 09/20/19 0329 09/21/19 0242  NA 140   < > 142   < > 144 142 140 143 140  K 2.7*   < > 3.5   < > 4.1 4.4 4.5 4.0 3.9  CL  109   < > 113*   < > 115* 115* 112* 112* 112*  CO2 19*   < > 19*   < > 18* 18* 16* 17* 17*  GLUCOSE 92   < > 126*   < > 137* 154* 103* 120* 150*  BUN 23   < > 27*   < > 24* 30* 33* 36* 30*  CREATININE 1.78*   < > 2.02*   < > 1.97* 2.12* 2.01* 1.87* 1.78*  CALCIUM 8.3*   < > 8.3*   < > 8.7* 8.8* 8.8* 8.8* 8.3*  MG 2.1  --  2.2  --   --   --   --   --   --   PHOS 3.9  --  3.3  --   --   --   --   --   --    < > = values in this interval not displayed.   GFR: Estimated Creatinine Clearance: 28.9 mL/min (A) (by C-G formula based on SCr of 1.78 mg/dL (H)). Liver Function Tests: Recent Labs  Lab  09/14/19 1620 09/17/19 0317  AST 31 14*  ALT 19 15  ALKPHOS 80 13*  BILITOT 0.6 0.8  PROT 7.4 6.8  ALBUMIN 3.0* 2.6*   No results for input(s): LIPASE, AMYLASE in the last 168 hours. No results for input(s): AMMONIA in the last 168 hours. Coagulation Profile: Recent Labs  Lab 09/14/19 1725 09/17/19 0843  INR 1.1 1.1   Cardiac Enzymes: No results for input(s): CKTOTAL, CKMB, CKMBINDEX, TROPONINI in the last 168 hours. BNP (last 3 results) No results for input(s): PROBNP in the last 8760 hours. HbA1C: No results for input(s): HGBA1C in the last 72 hours. CBG: Recent Labs  Lab 09/20/19 1230 09/20/19 1659 09/20/19 2050 09/21/19 0754 09/21/19 1203  GLUCAP 118* 156* 150* 123* 78   Lipid Profile: No results for input(s): CHOL, HDL, LDLCALC, TRIG, CHOLHDL, LDLDIRECT in the last 72 hours. Thyroid Function Tests: No results for input(s): TSH, T4TOTAL, FREET4, T3FREE, THYROIDAB in the last 72 hours. Anemia Panel: No results for input(s): VITAMINB12, FOLATE, FERRITIN, TIBC, IRON, RETICCTPCT in the last 72 hours.    Radiology Studies: I have reviewed all of the imaging during this hospital visit personally     Scheduled Meds: . amLODipine  10 mg Oral Daily  . atorvastatin  40 mg Oral Daily  . diclofenac Sodium  2 g Topical QID  . ferrous sulfate  325 mg Oral BID WC  .  influenza vaccine adjuvanted  0.5 mL Intramuscular Tomorrow-1000  . insulin aspart  0-15 Units Subcutaneous TID WC  . insulin aspart  0-5 Units Subcutaneous QHS  . labetalol  200 mg Oral TID  . megestrol  80 mg Oral BID  . pneumococcal 23 valent vaccine  0.5 mL Intramuscular Tomorrow-1000   Continuous Infusions: . sodium chloride    .  ceFAZolin (ANCEF) IV 2 g (09/21/19 1043)     LOS: 7 days        Vanessa [name] Gerome Apley, MD

## 2019-09-21 NOTE — H&P (View-Only) (Signed)
PROGRESS NOTE    Vanessa [name]  EGB:[id number] DOB: 10/27/1955 DOA: 09/14/2019 PCP: Maren Reamer, MD (Inactive)    Brief Narrative:  Patient was admitted to the hospital with a working diagnosis of symptomatic anemia in the setting of transvaginal bleeding newly diagnosed endometrial cancer.Hospitalization complicated with MSSA bacteremia  64 year old female who presented with vaginal bleeding. She does have significant past medical history for hypertension, type 2 diabetes mellitus and asthma. Patient reportedno bleeding for several months. Positive dyspnea over the last 3 weeks prior to hospitalization. Physical examination her blood pressure was 179/79, heart rate 79, respiratory rate 19, temperature 97.7, oxygenation 100%,her lungs are clear to auscultation bilaterally, heart is also present rhythmic, soft abdomen, +2+ lower extremity edema. Sodium 140, potassium 3.1, chloride 112, bicarb 18, glucose 113, BUN 27, creatinine 1.9, white count 6.8, hemoglobin 4.8, hematocrit 17.6, platelets 115.  Patient has received total of 3 units packed red blood cells,1 dose of Feraheme. Her vaginal ultrasound had thickened endometrium. Patient has been placed on Megace perGYN service.  Patient underwent inpatient endometrial biopsy, pathology positive for endometrial cancer.   On 03/09 #2/2 bottles blood cultures resulted positive for MSSA, follow up work up with echocardiogram showed no vegetations, patient placed on IV cefazolin. Pending results of follow up blood cultures.  Cardiology has been consulted for TEE, plan for 09/23/19.    Assessment & Plan:   Principal Problem:   MSSA bacteremia Active Problems:   DM type 2 (diabetes mellitus, type 2) (HCC)   HTN (hypertension)   Postmenopausal bleeding   Thrombocytopenia (HCC)   Acute on chronic renal insufficiency   Hypokalemia   Endometrial adenocarcinoma (HCC)   Left humeral fracture   Iron deficiency anemia due to  chronic blood loss   1. Severe symptomatic anemia due to acute on chronic blood loss due to vaginal bleeding/ thrombocytopenia/ iron deficiency/ new diagnosed endometrial adenocarcinoma.StableHgb todayat7,9 with Hct at 25.7and Plt continue to increase now up to 194.No further bleeding.  On Megace 800 mg bid with good toleration, will follow with GYN oncology as outpatient.   On iron supplementation.   2. HTN.continue with amlodipine and labetalol, for blood pressure control.   3. T2DM/ dyslipidemia .Fasting glucose this am is 150 mg/dl. On insulin sliding scale for glucose cover and monitoring.   Continue with atorvastatin.  4. 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 Will add oral bicarb tid.    Continue to hold on IV fluids,   5. Obesity class 2, healing proximal left humeral fracture.  BMI is 32.C Pain control with oxycodone and topical diclofenac. Continue with sling support for comfort and continue physical therapy as tolerated.   Left upper extremity US negative for DVT, but positive superficial vein thrombosis involving the left cephalic vein.   6. MSSA bacteremia. transthoracic echocardiography with no vegetations.  Follow up blood cultures from 03/11 continue with no growth.   On cefazolin IV, with plan for TEE on 03.15.21. Likely patient will need 2 weeks of antibiotic therapy if no valvular vegetations. Patient will need picc line before discharge.   DVT prophylaxis:scd Code Status:full Family Communication:no family at the bedside  Disposition Plan/ discharge barriers:patient from home, barrier for dc severe decondition, bacteremia, requiring IV antibiotic therapy and further advanced diagnostics with TEE.  Consultants:    ID  GYN  Oncology   Procedures:   Endometrial biopsy   Antimicrobials:  Cefazolin     Subjective: Patient is  feeling well, no nausea or vomiting, no chest pain or dyspnea, left upper extremity pain is better controlled.   Objective: Vitals:   09/20/19 1638 09/20/19 2049 09/21/19 0409 09/21/19 0500  BP:  (!) 149/76 (!) 154/76   Pulse:  71 68   Resp:   16   Temp:  99 F (37.2 C)    TempSrc:  Oral    SpO2: 100% 100% 100%   Weight:    76.8 kg  Height:        Intake/Output Summary (Last 24 hours) at 09/21/2019 1350 Last data filed at 09/21/2019 0800 Gross per 24 hour  Intake 240 ml  Output --  Net 240 ml   Filed Weights   09/19/19 0422 09/20/19 0630 09/21/19 0500  Weight: 74.4 kg 77.6 kg 76.8 kg    Examination:   General: Not in pain or dyspnea. Deconditioned  Neurology: Awake and alert, non focal  E ENT: mild pallor, no icterus, oral mucosa moist Cardiovascular: No JVD. S1-S2 present, rhythmic, no gallops, rubs, or murmurs. Left upper extremity edema non pitting edema. Pulmonary: positive breath sounds bilaterally, adequate air movement, no wheezing, rhonchi or rales. Gastrointestinal. Abdomen with no organomegaly, non tender, no rebound or guarding Skin. No rashes Musculoskeletal: no joint deformities     Data Reviewed: I have personally reviewed following labs and imaging studies  CBC: Recent Labs  Lab 09/17/19 0317 09/17/19 0317 09/17/19 0843 09/18/19 0255 09/19/19 0220 09/20/19 0840 09/21/19 0242  WBC 15.1*  --   --  14.8* 16.3* 13.3* 14.2*  NEUTROABS 14.2*  --   --  13.9* 15.0* 11.5* 12.1*  HGB 7.6*  --   --  7.4* 7.4* 8.0* 7.9*  HCT 24.7*  --   --  23.8* 24.7* 26.3* 25.7*  MCV 82.6  --   --  83.5 85.2 86.2 84.8  PLT 48*   < > 52* 55* 79* 125* 194   < > = values in this interval not displayed.   Basic Metabolic Panel: Recent Labs  Lab 09/15/19 0616 09/15/19 0616 09/16/19 0139 09/16/19 0139 09/17/19 0317 09/18/19 0255 09/19/19 0220 09/20/19 0329 09/21/19 0242  NA 140   < > 142   < > 144 142 140 143 140  K 2.7*   < > 3.5   < > 4.1 4.4 4.5 4.0 3.9  CL  109   < > 113*   < > 115* 115* 112* 112* 112*  CO2 19*   < > 19*   < > 18* 18* 16* 17* 17*  GLUCOSE 92   < > 126*   < > 137* 154* 103* 120* 150*  BUN 23   < > 27*   < > 24* 30* 33* 36* 30*  CREATININE 1.78*   < > 2.02*   < > 1.97* 2.12* 2.01* 1.87* 1.78*  CALCIUM 8.3*   < > 8.3*   < > 8.7* 8.8* 8.8* 8.8* 8.3*  MG 2.1  --  2.2  --   --   --   --   --   --   PHOS 3.9  --  3.3  --   --   --   --   --   --    < > = values in this interval not displayed.   GFR: Estimated Creatinine Clearance: 28.9 mL/min (A) (by C-G formula based on SCr of 1.78 mg/dL (H)). Liver Function Tests: Recent Labs  Lab  09/14/19 1620 09/17/19 0317  AST 31 14*  ALT 19 15  ALKPHOS 80 13*  BILITOT 0.6 0.8  PROT 7.4 6.8  ALBUMIN 3.0* 2.6*   No results for input(s): LIPASE, AMYLASE in the last 168 hours. No results for input(s): AMMONIA in the last 168 hours. Coagulation Profile: Recent Labs  Lab 09/14/19 1725 09/17/19 0843  INR 1.1 1.1   Cardiac Enzymes: No results for input(s): CKTOTAL, CKMB, CKMBINDEX, TROPONINI in the last 168 hours. BNP (last 3 results) No results for input(s): PROBNP in the last 8760 hours. HbA1C: No results for input(s): HGBA1C in the last 72 hours. CBG: Recent Labs  Lab 09/20/19 1230 09/20/19 1659 09/20/19 2050 09/21/19 0754 09/21/19 1203  GLUCAP 118* 156* 150* 123* 78   Lipid Profile: No results for input(s): CHOL, HDL, LDLCALC, TRIG, CHOLHDL, LDLDIRECT in the last 72 hours. Thyroid Function Tests: No results for input(s): TSH, T4TOTAL, FREET4, T3FREE, THYROIDAB in the last 72 hours. Anemia Panel: No results for input(s): VITAMINB12, FOLATE, FERRITIN, TIBC, IRON, RETICCTPCT in the last 72 hours.    Radiology Studies: I have reviewed all of the imaging during this hospital visit personally     Scheduled Meds: . amLODipine  10 mg Oral Daily  . atorvastatin  40 mg Oral Daily  . diclofenac Sodium  2 g Topical QID  . ferrous sulfate  325 mg Oral BID WC  .  influenza vaccine adjuvanted  0.5 mL Intramuscular Tomorrow-1000  . insulin aspart  0-15 Units Subcutaneous TID WC  . insulin aspart  0-5 Units Subcutaneous QHS  . labetalol  200 mg Oral TID  . megestrol  80 mg Oral BID  . pneumococcal 23 valent vaccine  0.5 mL Intramuscular Tomorrow-1000   Continuous Infusions: . sodium chloride    .  ceFAZolin (ANCEF) IV 2 g (09/21/19 1043)     LOS: 7 days        Burke Terry Gerome Apley, MD

## 2019-09-22 ENCOUNTER — Inpatient Hospital Stay: Payer: Self-pay

## 2019-09-22 ENCOUNTER — Inpatient Hospital Stay (HOSPITAL_COMMUNITY): Payer: Medicare Other

## 2019-09-22 DIAGNOSIS — L8990 Pressure ulcer of unspecified site, unspecified stage: Secondary | ICD-10-CM | POA: Insufficient documentation

## 2019-09-22 DIAGNOSIS — S42295A Other nondisplaced fracture of upper end of left humerus, initial encounter for closed fracture: Secondary | ICD-10-CM

## 2019-09-22 LAB — BASIC METABOLIC PANEL
Anion gap: 8 (ref 5–15)
BUN: 29 mg/dL — ABNORMAL HIGH (ref 8–23)
CO2: 19 mmol/L — ABNORMAL LOW (ref 22–32)
Calcium: 8.2 mg/dL — ABNORMAL LOW (ref 8.9–10.3)
Chloride: 113 mmol/L — ABNORMAL HIGH (ref 98–111)
Creatinine, Ser: 1.68 mg/dL — ABNORMAL HIGH (ref 0.44–1.00)
GFR calc Af Amer: 37 mL/min — ABNORMAL LOW (ref >60)
GFR calc non Af Amer: 32 mL/min — ABNORMAL LOW (ref >60)
Glucose, Bld: 98 mg/dL (ref 70–99)
Potassium: 3.7 mmol/L (ref 3.5–5.1)
Sodium: 140 mmol/L (ref 135–145)

## 2019-09-22 LAB — CBC WITH DIFFERENTIAL/PLATELET
Abs Immature Granulocytes: 0.23 10*3/uL — ABNORMAL HIGH (ref 0.00–0.07)
Basophils Absolute: 0.1 10*3/uL (ref 0.0–0.1)
Basophils Relative: 1 %
Eosinophils Absolute: 0.2 10*3/uL (ref 0.0–0.5)
Eosinophils Relative: 1 %
HCT: 26 % — ABNORMAL LOW (ref 36.0–46.0)
Hemoglobin: 7.8 g/dL — ABNORMAL LOW (ref 12.0–15.0)
Immature Granulocytes: 1 %
Lymphocytes Relative: 7 %
Lymphs Abs: 1.2 10*3/uL (ref 0.7–4.0)
MCH: 25.8 pg — ABNORMAL LOW (ref 26.0–34.0)
MCHC: 30 g/dL (ref 30.0–36.0)
MCV: 86.1 fL (ref 80.0–100.0)
Monocytes Absolute: 1 10*3/uL (ref 0.1–1.0)
Monocytes Relative: 6 %
Neutro Abs: 14.5 10*3/uL — ABNORMAL HIGH (ref 1.7–7.7)
Neutrophils Relative %: 84 %
Platelets: 275 10*3/uL (ref 150–400)
RBC: 3.02 MIL/uL — ABNORMAL LOW (ref 3.87–5.11)
RDW: 22.5 % — ABNORMAL HIGH (ref 11.5–15.5)
WBC: 17.2 10*3/uL — ABNORMAL HIGH (ref 4.0–10.5)
nRBC: 0.2 % (ref 0.0–0.2)

## 2019-09-22 LAB — GLUCOSE, CAPILLARY
Glucose-Capillary: 127 mg/dL — ABNORMAL HIGH (ref 70–99)
Glucose-Capillary: 108 mg/dL — ABNORMAL HIGH (ref 70–99)
Glucose-Capillary: 117 mg/dL — ABNORMAL HIGH (ref 70–99)
Glucose-Capillary: 132 mg/dL — ABNORMAL HIGH (ref 70–99)

## 2019-09-22 NOTE — Progress Notes (Signed)
PROGRESS NOTE    Vanessa [name]  CBJ:[id number] DOB: 07/06/1956 DOA: 09/14/2019 PCP: Maren Reamer, MD (Inactive)    Brief Narrative:  Patient was admitted to the hospital with a working diagnosis of symptomatic anemia in the setting of transvaginal bleedingnewly diagnosed endometrial cancer.Hospitalization complicated with MSSA bacteremia  64 year old female who presented with vaginal bleeding. She does have significant past medical history for hypertension, type 2 diabetes mellitus and asthma. Patient reportedno bleeding for several months. Positive dyspnea over the last 3 weeks prior to hospitalization. Physical examination her blood pressure was 179/79, heart rate 79, respiratory rate 19, temperature 97.7, oxygenation 100%,her lungs are clear to auscultation bilaterally, heart is also present rhythmic, soft abdomen, +2+ lower extremity edema. Sodium 140, potassium 3.1, chloride 112, bicarb 18, glucose 113, BUN 27, creatinine 1.9, white count 6.8, hemoglobin 4.8, hematocrit 17.6, platelets 115.  Patient has received total of 3 units packed red blood cells,1 dose of Feraheme. Her vaginal ultrasound had thickened endometrium. Patient has been placed on Megace perGYN service.  Patientunderwentinpatient endometrial biopsy, pathology positive for endometrial cancer.  On 03/09 #2/2 bottles blood cultures resulted positive for MSSA, follow up work up with echocardiogram showed no vegetations, patient placed on IV cefazolin. Pending results of follow up blood cultures.  Cardiology has been consulted for TEE, plan for 09/23/19.  Left upper extremity US negative for DVT, but positive superficial vein thrombosis involving the left cephalic vein, no indication for anticoagulation.    Assessment & Plan:   Principal Problem:   MSSA bacteremia Active Problems:   DM type 2 (diabetes mellitus, type 2) (HCC)   HTN (hypertension)   Postmenopausal bleeding   Thrombocytopenia  (HCC)   Acute on chronic renal insufficiency   Hypokalemia   Endometrial adenocarcinoma (HCC)   Left humeral fracture   Iron deficiency anemia due to chronic blood loss   Pressure injury of skin    1. Severe symptomatic anemia due to acute on chronic blood loss due to vaginal bleeding/ thrombocytopenia/ iron deficiency/ new diagnosed endometrial adenocarcinoma.Hgb todayat7,8with Hct at 26and Plt 275.No further significant bleeding.  Continue with Megace 800 mg bid per GYN oncology, recommendations, until outpatient follow up.   Continue with po iron supplementation.  2. HTN.Blood pressure control with amlodipine and labetalol.  3. T2DM/ dyslipidemia .Glucose fasting this am is 98 mg/dl. Continue with insulin sliding scale for glucose cover and monitoring.  On atorvastatin.  4. CKD stage 3bwith non gap metabolic acidosis. Serum cr this am at1,68and K at 3,9and serum bicarbonate stable at 19. Will continue with oral bicarb tid.  Continue holding IV fluids.   5. Obesity class 2, healing proximal left humeral fracture.BMI is 32. Continue pain control with oxycodone and topical diclofenac. Sling support for comfort, physical therapy, and plan to transfer to SNF.  6. Positive superficial vein thrombosis involving the left cephalic vein. Continue pain control and sling for support.   6. MSSA bacteremia.transthoracic echocardiography with no vegetations.  Follow up blood cultures from 03/11 continue with no growth #48 H. Wbc is up to 17 today.   On CefazolinIV, will order a picc line. Plan for TEE in am.   7. Dental pain. Persistent pain, will order x rays for further evaluation. Will need outpatient follow up.   DVT prophylaxis:scd Code Status:full Family Communication:no family at the bedside Disposition Plan/ discharge barriers:patient from home, barrier for dc severe decondition, bacteremia, requiring IV antibiotic therapy and further  advanced diagnostics with TEE.  Consultants:  ID  GYN  Oncology  Procedures:  Endometrial biopsy  Antimicrobials:  Cefazolin    Skin Documentation: Pressure Injury 09/22/19  Medial Stage 2 -  Partial thickness loss of dermis presenting as a shallow open injury with a red, pink wound bed without slough. (Active)  09/22/19 0341  Location:   Location Orientation: Medial  Staging: Stage 2 -  Partial thickness loss of dermis presenting as a shallow open injury with a red, pink wound bed without slough.  Wound Description (Comments):   Present on Admission:      Subjective: Patient is feeling better, but continue to have dental pain on the right side, no nausea or vomiting, no chest pain or dyspnea, this am out of bed to chair.   Objective: Vitals:   09/21/19 0500 09/21/19 1503 09/21/19 2134 09/22/19 0504  BP:  (!) 148/73 (!) 153/70 (!) 166/73  Pulse:  73 77 76  Resp:  18 16 18   Temp:  99.3 F (37.4 C) 98 F (36.7 C) 98.4 F (36.9 C)  TempSrc:  Oral Oral Oral  SpO2:  100% 99% 100%  Weight: 76.8 kg     Height:        Intake/Output Summary (Last 24 hours) at 09/22/2019 0904 Last data filed at 09/22/2019 0750 Gross per 24 hour  Intake 650 ml  Output 100 ml  Net 550 ml   Filed Weights   09/19/19 0422 09/20/19 0630 09/21/19 0500  Weight: 74.4 kg 77.6 kg 76.8 kg    Examination:   General: Not in pain or dyspnea, deconditioned  Neurology: Awake and alert, non focal  E ENT: mild pallor, no icterus, oral mucosa moist. Very poor dentition Cardiovascular: No JVD. S1-S2 present, rhythmic, no gallops, rubs, or murmurs. Left upper extremity with edema, lower extremities with compression bandages. Pulmonary: positive breath sounds bilaterally, adequate air movement, no wheezing, rhonchi or rales. Gastrointestinal. Abdomen with, no organomegaly, non tender, no rebound or guarding Skin. No rashes Musculoskeletal: no joint deformities     Data Reviewed: I  have personally reviewed following labs and imaging studies  CBC: Recent Labs  Lab 09/18/19 0255 09/19/19 0220 09/20/19 0840 09/21/19 0242 09/22/19 0423  WBC 14.8* 16.3* 13.3* 14.2* 17.2*  NEUTROABS 13.9* 15.0* 11.5* 12.1* 14.5*  HGB 7.4* 7.4* 8.0* 7.9* 7.8*  HCT 23.8* 24.7* 26.3* 25.7* 26.0*  MCV 83.5 85.2 86.2 84.8 86.1  PLT 55* 79* 125* 194 287   Basic Metabolic Panel: Recent Labs  Lab 09/16/19 0139 09/17/19 0317 09/18/19 0255 09/19/19 0220 09/20/19 0329 09/21/19 0242 09/22/19 0423  NA 142   < > 142 140 143 140 140  K 3.5   < > 4.4 4.5 4.0 3.9 3.7  CL 113*   < > 115* 112* 112* 112* 113*  CO2 19*   < > 18* 16* 17* 17* 19*  GLUCOSE 126*   < > 154* 103* 120* 150* 98  BUN 27*   < > 30* 33* 36* 30* 29*  CREATININE 2.02*   < > 2.12* 2.01* 1.87* 1.78* 1.68*  CALCIUM 8.3*   < > 8.8* 8.8* 8.8* 8.3* 8.2*  MG 2.2  --   --   --   --   --   --   PHOS 3.3  --   --   --   --   --   --    < > = values in this interval not displayed.   GFR: Estimated Creatinine Clearance: 30.6 mL/min (A) (by C-G formula based on SCr of 1.68  mg/dL (H)). Liver Function Tests: Recent Labs  Lab 09/17/19 0317  AST 14*  ALT 15  ALKPHOS 13*  BILITOT 0.8  PROT 6.8  ALBUMIN 2.6*   No results for input(s): LIPASE, AMYLASE in the last 168 hours. No results for input(s): AMMONIA in the last 168 hours. Coagulation Profile: Recent Labs  Lab 09/17/19 0843  INR 1.1   Cardiac Enzymes: No results for input(s): CKTOTAL, CKMB, CKMBINDEX, TROPONINI in the last 168 hours. BNP (last 3 results) No results for input(s): PROBNP in the last 8760 hours. HbA1C: No results for input(s): HGBA1C in the last 72 hours. CBG: Recent Labs  Lab 09/21/19 0754 09/21/19 1203 09/21/19 1653 09/21/19 2136 09/22/19 0811  GLUCAP 123* 78 103* 108* 127*   Lipid Profile: No results for input(s): CHOL, HDL, LDLCALC, TRIG, CHOLHDL, LDLDIRECT in the last 72 hours. Thyroid Function Tests: No results for input(s): TSH,  T4TOTAL, FREET4, T3FREE, THYROIDAB in the last 72 hours. Anemia Panel: No results for input(s): VITAMINB12, FOLATE, FERRITIN, TIBC, IRON, RETICCTPCT in the last 72 hours.    Radiology Studies: I have reviewed all of the imaging during this hospital visit personally     Scheduled Meds: . amLODipine  10 mg Oral Daily  . atorvastatin  40 mg Oral Daily  . diclofenac Sodium  2 g Topical QID  . ferrous sulfate  325 mg Oral BID WC  . influenza vaccine adjuvanted  0.5 mL Intramuscular Tomorrow-1000  . insulin aspart  0-15 Units Subcutaneous TID WC  . insulin aspart  0-5 Units Subcutaneous QHS  . labetalol  200 mg Oral TID  . megestrol  80 mg Oral BID  . pneumococcal 23 valent vaccine  0.5 mL Intramuscular Tomorrow-1000  . polyethylene glycol  17 g Oral BID   Continuous Infusions: . sodium chloride    .  ceFAZolin (ANCEF) IV 2 g (09/22/19 0026)     LOS: 8 days        Tyton Abdallah Gerome Apley, MD

## 2019-09-22 NOTE — Progress Notes (Signed)
SPoke with Hinton Dyer RN re PICC order.  Will plan on placement this pm or 09/23/19.

## 2019-09-23 ENCOUNTER — Telehealth: Payer: Self-pay | Admitting: Family Medicine

## 2019-09-23 ENCOUNTER — Encounter (HOSPITAL_COMMUNITY): Payer: Self-pay | Admitting: Internal Medicine

## 2019-09-23 ENCOUNTER — Inpatient Hospital Stay (HOSPITAL_COMMUNITY): Payer: Medicare Other

## 2019-09-23 ENCOUNTER — Encounter (HOSPITAL_COMMUNITY)
Admission: EM | Disposition: A | Discharge status: Skilled Nursing Facility | Payer: Self-pay | Source: Home / Self Care | Attending: Internal Medicine

## 2019-09-23 ENCOUNTER — Inpatient Hospital Stay (HOSPITAL_COMMUNITY): Payer: Medicare Other | Admitting: Anesthesiology

## 2019-09-23 DIAGNOSIS — K0889 Other specified disorders of teeth and supporting structures: Secondary | ICD-10-CM

## 2019-09-23 DIAGNOSIS — C541 Malignant neoplasm of endometrium: Secondary | ICD-10-CM

## 2019-09-23 DIAGNOSIS — R1310 Dysphagia, unspecified: Secondary | ICD-10-CM

## 2019-09-23 DIAGNOSIS — L8990 Pressure ulcer of unspecified site, unspecified stage: Secondary | ICD-10-CM

## 2019-09-23 DIAGNOSIS — L8992 Pressure ulcer of unspecified site, stage 2: Secondary | ICD-10-CM

## 2019-09-23 LAB — CYTOLOGY - PAP
Comment: NEGATIVE
High risk HPV: NEGATIVE

## 2019-09-23 LAB — GLUCOSE, CAPILLARY
Glucose-Capillary: 115 mg/dL — ABNORMAL HIGH (ref 70–99)
Glucose-Capillary: 80 mg/dL (ref 70–99)
Glucose-Capillary: 85 mg/dL (ref 70–99)
Glucose-Capillary: 99 mg/dL (ref 70–99)
Glucose-Capillary: 80 mg/dL (ref 70–99)

## 2019-09-23 LAB — IMMUNOFIXATION ELECTROPHORESIS
IgA: 244 mg/dL (ref 87–352)
IgG (Immunoglobin G), Serum: 1858 mg/dL — ABNORMAL HIGH (ref 586–1602)
IgM (Immunoglobulin M), Srm: 19 mg/dL — ABNORMAL LOW (ref 26–217)
Total Protein ELP: 6 g/dL (ref 6.0–8.5)

## 2019-09-23 LAB — CBC WITH DIFFERENTIAL/PLATELET
Abs Immature Granulocytes: 0.41 10*3/uL — ABNORMAL HIGH (ref 0.00–0.07)
Basophils Absolute: 0.1 10*3/uL (ref 0.0–0.1)
Basophils Relative: 1 %
Eosinophils Absolute: 0.3 10*3/uL (ref 0.0–0.5)
Eosinophils Relative: 2 %
HCT: 26.6 % — ABNORMAL LOW (ref 36.0–46.0)
Hemoglobin: 8 g/dL — ABNORMAL LOW (ref 12.0–15.0)
Immature Granulocytes: 2 %
Lymphocytes Relative: 9 %
Lymphs Abs: 1.6 10*3/uL (ref 0.7–4.0)
MCH: 25.7 pg — ABNORMAL LOW (ref 26.0–34.0)
MCHC: 30.1 g/dL (ref 30.0–36.0)
MCV: 85.5 fL (ref 80.0–100.0)
Monocytes Absolute: 1 10*3/uL (ref 0.1–1.0)
Monocytes Relative: 5 %
Neutro Abs: 14.9 10*3/uL — ABNORMAL HIGH (ref 1.7–7.7)
Neutrophils Relative %: 81 %
Platelets: 424 10*3/uL — ABNORMAL HIGH (ref 150–400)
RBC: 3.11 MIL/uL — ABNORMAL LOW (ref 3.87–5.11)
RDW: 22.8 % — ABNORMAL HIGH (ref 11.5–15.5)
WBC: 18.2 10*3/uL — ABNORMAL HIGH (ref 4.0–10.5)
nRBC: 0 % (ref 0.0–0.2)

## 2019-09-23 SURGERY — INVASIVE LAB ABORTED CASE
Anesthesia: Monitor Anesthesia Care

## 2019-09-23 MED ORDER — SODIUM CHLORIDE 0.9 % IV SOLN: INTRAVENOUS | Status: DC

## 2019-09-23 MED ORDER — PROPOFOL 500 MG/50ML IV EMUL
INTRAVENOUS | Status: DC | PRN
  Administered 2019-09-23: 75 ug/kg/min via INTRAVENOUS

## 2019-09-23 MED ORDER — CHLORHEXIDINE GLUCONATE CLOTH 2 % EX PADS
6.0000 | MEDICATED_PAD | Freq: Every day | CUTANEOUS | Status: DC
  Administered 2019-09-23 – 2019-09-25 (×3): 6 via TOPICAL

## 2019-09-23 MED ORDER — SODIUM CHLORIDE 0.9% FLUSH
10.0000 mL | INTRAVENOUS | Status: DC | PRN
  Administered 2019-09-23 – 2019-09-25 (×2): 10 mL

## 2019-09-23 MED ORDER — SODIUM CHLORIDE 0.9% FLUSH
10.0000 mL | Freq: Two times a day (BID) | INTRAVENOUS | Status: DC
  Administered 2019-09-24: 10 mL

## 2019-09-23 MED ORDER — PROPOFOL 10 MG/ML IV BOLUS
INTRAVENOUS | Status: DC | PRN
  Administered 2019-09-23 (×4): 10 mg via INTRAVENOUS

## 2019-09-23 NOTE — Progress Notes (Signed)
[facility] for Infectious Disease  Date of Admission:  09/14/2019     Total days of antibiotics 6         ASSESSMENT:  Ms. Hazelett was unable to complete TEE today for evaluation of endocarditis. She is at increased risk given poor dental hygiene as well as difficulty swallowing at times. Repeat blood cultures from 3/11 have remained without growth to date. [place] for PICC line placement. Will go ahead and plan for 6 weeks of IV therapy with Cefazolin using 09/19/19 as the starting date and end date of 4/22. OPAT orders have been placed. Most likely source being her bilateral lower extremity wounds. Continue wound care per primary team. Referred to Gynecology/Oncology for endometrial adenocarcenima  PLAN:  1. Continue cefazolin.  2. OPAT orders placed. 3. We will monitor her leukocytosis.  4. Plan for 6 weeks of treatment through 4/22.  5. PICC line okay when able. 6. Wound care of bilateral lower extremities per primary team.    Principal Problem:   MSSA bacteremia Active Problems:   Postmenopausal bleeding   Thrombocytopenia ([place])   DM type 2 (diabetes mellitus, type 2) (HCC)   HTN (hypertension)   Acute on chronic renal insufficiency   Hypokalemia   Endometrial adenocarcinoma (HCC)   Left humeral fracture   Iron deficiency anemia due to chronic blood loss   Pressure injury of skin   . amLODipine  10 mg Oral Daily  . atorvastatin  40 mg Oral Daily  . diclofenac Sodium  2 g Topical QID  . ferrous sulfate  325 mg Oral BID WC  . influenza vaccine adjuvanted  0.5 mL Intramuscular Tomorrow-1000  . insulin aspart  0-15 Units Subcutaneous TID WC  . insulin aspart  0-5 Units Subcutaneous QHS  . labetalol  200 mg Oral TID  . megestrol  80 mg Oral BID  . pneumococcal 23 valent vaccine  0.5 mL Intramuscular Tomorrow-1000  . polyethylene glycol  17 g Oral BID    SUBJECTIVE:  Afebrile overnight with no acute events. Has had fluctuating WBC count over the last couple of days  most recently 18.2. TEE unable to be completed. She is doing okay today not feeling the greatest.   No Known Allergies   Review of Systems: Review of Systems  Constitutional: Positive for malaise/fatigue. Negative for chills, fever and weight loss.  Respiratory: Negative for cough, shortness of breath and wheezing.   Cardiovascular: Negative for chest pain and leg swelling.  Gastrointestinal: Negative for abdominal pain, constipation, diarrhea, nausea and vomiting.  Skin: Negative for rash.      OBJECTIVE: Vitals:   09/23/19 1247 09/23/19 1250 09/23/19 1259 09/23/19 1312  BP: (!) 169/67  (!) 166/71 (!) 172/84  Pulse: 80 79  81  Resp: 18 17 15 17   Temp:    98.4 F (36.9 C)  TempSrc:    Oral  SpO2: 100% 100% 99% 100%  Weight:      Height:       Body mass index is 33.07 kg/m.  Physical Exam Constitutional:      General: She is not in acute distress.    Appearance: She is well-developed.  Cardiovascular:     Rate and Rhythm: Normal rate and regular rhythm.     Heart sounds: Normal heart sounds.  Pulmonary:     Effort: Pulmonary effort is normal.     Breath sounds: Normal breath sounds.  Skin:    General: Skin is warm and dry.  Neurological:  Mental Status: She is alert and oriented to person, place, and time.  Psychiatric:        Behavior: Behavior normal.        Thought Content: Thought content normal.        Judgment: Judgment normal.     Lab Results Lab Results  Component Value Date   WBC 18.2 (H) 09/23/2019   HGB 8.0 (L) 09/23/2019   HCT 26.6 (L) 09/23/2019   MCV 85.5 09/23/2019   PLT 424 (H) 09/23/2019    Lab Results  Component Value Date   CREATININE 1.68 (H) 09/22/2019   BUN 29 (H) 09/22/2019   NA 140 09/22/2019   K 3.7 09/22/2019   CL 113 (H) 09/22/2019   CO2 19 (L) 09/22/2019    Lab Results  Component Value Date   ALT 15 09/17/2019   AST 14 (L) 09/17/2019   ALKPHOS 13 (L) 09/17/2019   BILITOT 0.8 09/17/2019      Microbiology: Recent Results (from the past 240 hour(s))  SARS CORONAVIRUS 2 (TAT 6-24 HRS) Nasopharyngeal Nasopharyngeal Swab     Status: None   Collection Time: 09/14/19  5:25 PM   Specimen: Nasopharyngeal Swab  Result Value Ref Range Status   SARS Coronavirus 2 NEGATIVE NEGATIVE Final    Comment: (NOTE) SARS-CoV-2 target nucleic acids are NOT DETECTED. The SARS-CoV-2 RNA is generally detectable in upper and lower respiratory specimens during the acute phase of infection. Negative results do not preclude SARS-CoV-2 infection, do not rule out co-infections with other pathogens, and should not be used as the sole basis for treatment or other patient management decisions. Negative results must be combined with clinical observations, patient history, and epidemiological information. The expected result is Negative. Fact Sheet for Patients: SugarRoll.be Fact Sheet for Healthcare Providers: https://www.woods-mathews.com/ This test is not yet approved or cleared by the [place] FDA and  has been authorized for detection and/or diagnosis of SARS-CoV-2 by FDA under an Emergency Use Authorization (EUA). This EUA will remain  in effect (meaning this test can be used) for the duration of the COVID-19 declaration under Section 56 4(b)(1) of the Act, 21 U.S.C. section 360bbb-3(b)(1), unless the authorization is terminated or revoked sooner. Performed at [facility], [place] [address], [place], [place] [address]   Culture, blood (routine x 2)     Status: Abnormal   Collection Time: 09/17/19  8:40 AM   Specimen: BLOOD  Result Value Ref Range Status   Specimen Description BLOOD LEFT ANTECUBITAL  Final   Special Requests   Final    BOTTLES DRAWN AEROBIC ONLY Blood Culture results may not be optimal due to an inadequate volume of blood received in culture bottles   Culture  Setup Time   Final    AEROBIC BOTTLE ONLY GRAM POSITIVE COCCI IN  CLUSTERS CRITICAL RESULT CALLED TO, READ BACK BY AND VERIFIED WITH: Shellee Milo [facility] 09/17/19 2358 JDW Performed at [facility], [place] [address], [place], [place] [address]    Culture STAPHYLOCOCCUS AUREUS (A)  Final   Report Status 09/19/2019 FINAL  Final   Organism ID, Bacteria STAPHYLOCOCCUS AUREUS  Final      Susceptibility   Staphylococcus aureus - MIC*    CIPROFLOXACIN <=0.5 SENSITIVE Sensitive     ERYTHROMYCIN <=0.25 SENSITIVE Sensitive     GENTAMICIN <=0.5 SENSITIVE Sensitive     OXACILLIN <=0.25 SENSITIVE Sensitive     TETRACYCLINE <=1 SENSITIVE Sensitive     VANCOMYCIN 1 SENSITIVE Sensitive  TRIMETH/SULFA <=10 SENSITIVE Sensitive     CLINDAMYCIN <=0.25 SENSITIVE Sensitive     RIFAMPIN <=0.5 SENSITIVE Sensitive     Inducible Clindamycin NEGATIVE Sensitive     * STAPHYLOCOCCUS AUREUS  Blood Culture ID Panel (Reflexed)     Status: Abnormal   Collection Time: 09/17/19  8:40 AM  Result Value Ref Range Status   Enterococcus species NOT DETECTED NOT DETECTED Final   Listeria monocytogenes NOT DETECTED NOT DETECTED Final   Staphylococcus species DETECTED (A) NOT DETECTED Final    Comment: CRITICAL RESULT CALLED TO, READ BACK BY AND VERIFIED WITH: L SEAY PHARMD 09/17/19 2358 JDW    Staphylococcus aureus (BCID) DETECTED (A) NOT DETECTED Final    Comment: Methicillin (oxacillin) susceptible Staphylococcus aureus (MSSA). Preferred therapy is anti staphylococcal beta lactam antibiotic (Cefazolin or Nafcillin), unless clinically contraindicated. CRITICAL RESULT CALLED TO, READ BACK BY AND VERIFIED WITH: L SEAY PHARMD 09/17/19 2358 JDW    Methicillin resistance NOT DETECTED NOT DETECTED Final   Streptococcus species NOT DETECTED NOT DETECTED Final   Streptococcus agalactiae NOT DETECTED NOT DETECTED Final   Streptococcus pneumoniae NOT DETECTED NOT DETECTED Final   Streptococcus pyogenes NOT DETECTED NOT DETECTED Final   Acinetobacter baumannii NOT DETECTED NOT DETECTED Final    Enterobacteriaceae species NOT DETECTED NOT DETECTED Final   Enterobacter cloacae complex NOT DETECTED NOT DETECTED Final   Escherichia coli NOT DETECTED NOT DETECTED Final   Klebsiella oxytoca NOT DETECTED NOT DETECTED Final   Klebsiella pneumoniae NOT DETECTED NOT DETECTED Final   Proteus species NOT DETECTED NOT DETECTED Final   Serratia marcescens NOT DETECTED NOT DETECTED Final   Haemophilus influenzae NOT DETECTED NOT DETECTED Final   Neisseria meningitidis NOT DETECTED NOT DETECTED Final   Pseudomonas aeruginosa NOT DETECTED NOT DETECTED Final   Candida albicans NOT DETECTED NOT DETECTED Final   Candida glabrata NOT DETECTED NOT DETECTED Final   Candida krusei NOT DETECTED NOT DETECTED Final   Candida parapsilosis NOT DETECTED NOT DETECTED Final   Candida tropicalis NOT DETECTED NOT DETECTED Final    Comment: Performed at [facility], [place]. [address] [place], [place] [address]  Culture, blood (routine x 2)     Status: Abnormal   Collection Time: 09/17/19  8:47 AM   Specimen: BLOOD  Result Value Ref Range Status   Specimen Description BLOOD RIGHT ANTECUBITAL  Final   Special Requests   Final    BOTTLES DRAWN AEROBIC ONLY Blood Culture results may not be optimal due to an inadequate volume of blood received in culture bottles   Culture  Setup Time   Final    AEROBIC BOTTLE ONLY GRAM POSITIVE COCCI IN CLUSTERS CRITICAL VALUE NOTED.  VALUE IS CONSISTENT WITH PREVIOUSLY REPORTED AND CALLED VALUE.    Culture (A)  Final    STAPHYLOCOCCUS AUREUS SUSCEPTIBILITIES PERFORMED ON PREVIOUS CULTURE WITHIN THE LAST 5 DAYS. Performed at [facility], [place] [address] [place], [place] [address]    Report Status 09/19/2019 FINAL  Final  Culture, Urine     Status: Abnormal   Collection Time: 09/18/19 12:15 PM   Specimen: Urine, Random  Result Value Ref Range Status   Specimen Description URINE, RANDOM  Final   Special Requests NONE  Final   Culture (A)  Final    <10,000  COLONIES/mL INSIGNIFICANT GROWTH Performed at [facility], [place] [address], [place], [place] [address]    Report Status 09/19/2019 FINAL  Final  Culture, blood (routine x 2)  Status: None (Preliminary result)   Collection Time: 09/19/19  2:16 AM   Specimen: BLOOD  Result Value Ref Range Status   Specimen Description BLOOD LEFT FOREARM  Final   Special Requests   Final    BOTTLES DRAWN AEROBIC AND ANAEROBIC Blood Culture adequate volume Performed at [facility], [place] [address], [place], [place] [address]    Culture NO GROWTH 4 DAYS  Final   Report Status PENDING  Incomplete  Culture, blood (routine x 2)     Status: None (Preliminary result)   Collection Time: 09/19/19  2:23 AM   Specimen: BLOOD  Result Value Ref Range Status   Specimen Description BLOOD LEFT FOREARM  Final   Special Requests   Final    BOTTLES DRAWN AEROBIC ONLY Blood Culture results may not be optimal due to an inadequate volume of blood received in culture bottles Performed at [facility], [place] [address] [place], [place] [address]    Culture NO GROWTH 4 DAYS  Final   Report Status PENDING  Incomplete     Terri Piedra, NP [facility]   09/23/2019  1:56 PM

## 2019-09-23 NOTE — Progress Notes (Signed)
Peripherally Inserted Central Catheter/Midline Placement  The IV Nurse has discussed with the patient and/or persons authorized to consent for the patient, the purpose of this procedure and the potential benefits and risks involved with this procedure.  The benefits include less needle sticks, lab draws from the catheter, and the patient may be discharged home with the catheter. Risks include, but not limited to, infection, bleeding, blood clot (thrombus formation), and puncture of an artery; nerve damage and irregular heartbeat and possibility to perform a PICC exchange if needed/ordered by physician.  Alternatives to this procedure were also discussed.  Bard Power PICC patient education guide, fact sheet on infection prevention and patient information card has been provided to patient /or left at bedside.    PICC/Midline Placement Documentation  PICC Single Lumen 09/23/19 PICC Right Brachial 42 cm 3 cm (Active)  Indication for Insertion or Continuance of Line Home intravenous therapies (PICC only) 09/23/19 1604  Exposed Catheter (cm) 3 cm 09/23/19 1604  Site Assessment Clean;Dry;Intact 09/23/19 1604  Line Status Flushed;Saline locked;Blood return noted 09/23/19 1604  Dressing Type Transparent 09/23/19 1604  Dressing Status Clean;Dry;Intact;Antimicrobial disc in place 09/23/19 1604  Dressing Change Due 09/30/19 09/23/19 1604       Gordan Payment 09/23/2019, 4:06 PM

## 2019-09-23 NOTE — Care Management Important Message (Signed)
Important Message  Patient Details  Name: Vanessa [name] MRN: [id number] Date of Birth: [birth year]   Medicare Important Message Given:  Yes     Shelda Altes 09/23/2019, 11:59 AM

## 2019-09-23 NOTE — Interval H&P Note (Signed)
History and Physical Interval Note:  09/23/2019 11:46 AM  Vanessa [name]  has presented today for surgery, with the diagnosis of BACTEREMIA.  The various methods of treatment have been discussed with the patient and family. After consideration of risks, benefits and other options for treatment, the patient has consented to  Procedure(s): TRANSESOPHAGEAL ECHOCARDIOGRAM (TEE) (N/A) as a surgical intervention.  The patient's history has been reviewed, patient examined, no change in status, stable for surgery.  I have reviewed the patient's chart and labs.  Questions were answered to the patient's satisfaction.     Ena Dawley

## 2019-09-23 NOTE — Telephone Encounter (Signed)
Attempted to reach patient to inform her that per Dr Virginia Crews request, the scheduled appointment she had with her on 03/24 is not needed because she has already been seen for this.

## 2019-09-23 NOTE — TOC Progression Note (Signed)
Transition of Care [facility]) - Progression Note    Patient Details  Name: Vanessa [name] MRN: [id number] Date of Birth: [birth year]  Transition of Care [facility]) CM/SW Contact  Jacalyn Lefevre Edson Snowball, RN Phone Number: 09/23/2019, 3:12 PM  Clinical Narrative:     Bed offers provided last week , patient chose [facility].   Discussed with MD this afternoon, patient will be ready for discharge September 25, 2019. Patient aware.   [place] and started insurance authorization. Faxed clinicals.   Spoke with Freda Munro at [facility] she can accept patient 3/17 pending insurance authorization and patient will need covid test on 3/16 ( messaged MD for order). Freda Munro aware patient will need cefazolin.   for 6 weeks of treatment through 4/22.     Expected Discharge Plan: [place] Barriers to Discharge: Continued Medical Work up  Expected Discharge Plan and Services Expected Discharge Plan: [place]   Discharge Planning Services: CM Consult Post Acute Care Choice: [place] Living arrangements for the past 2 months: Single Family Home                 DME Arranged: N/A DME Agency: NA       HH Arranged: NA           Social Determinants of Health (SDOH) Interventions    Readmission Risk Interventions No flowsheet data found.

## 2019-09-23 NOTE — Progress Notes (Signed)
Secure chat with Dr. Cathlean Sauer. Will wait for TEE results to determine which access device.

## 2019-09-23 NOTE — Progress Notes (Signed)
PROGRESS NOTE    Vanessa [name]  CHE:[id number] DOB: Aug 21, 1955 DOA: 09/14/2019 PCP: Maren Reamer, MD (Inactive)    Brief Narrative:  Patient was admitted to the hospital with a working diagnosis of symptomatic anemia in the setting of transvaginal bleedingnewly diagnosed endometrial cancer.Hospitalization complicated with MSSA bacteremia  64 year old female who presented with vaginal bleeding. She does have significant past medical history for hypertension, type 2 diabetes mellitus and asthma. Patient reportedno bleeding for several months. Positive dyspnea over the last 3 weeks prior to hospitalization. Physical examination her blood pressure was 179/79, heart rate 79, respiratory rate 19, temperature 97.7, oxygenation 100%,her lungs are clear to auscultation bilaterally, heart is also present rhythmic, soft abdomen, +2+ lower extremity edema. Sodium 140, potassium 3.1, chloride 112, bicarb 18, glucose 113, BUN 27, creatinine 1.9, white count 6.8, hemoglobin 4.8, hematocrit 17.6, platelets 115.  Patient has received total of 3 units packed red blood cells,1 dose of Feraheme. Her vaginal ultrasound had thickened endometrium. Patient has been placed on Megace perGYN service.  Patientunderwentinpatient endometrial biopsy, pathology positive for endometrial cancer.  On 03/09 #2/2 bottles blood cultures resulted positive for MSSA, follow up work up with echocardiogram showed no vegetations, patient placed on IV cefazolin. Pending results of follow up blood cultures.  Cardiology has been consulted for TEE, plan for 09/23/19.  Left upper extremity US negative for DVT, but positive superficial vein thrombosis involving the left cephalic vein, no indication for anticoagulation.     Assessment & Plan:   Principal Problem:   MSSA bacteremia Active Problems:   DM type 2 (diabetes mellitus, type 2) (HCC)   HTN (hypertension)   Postmenopausal bleeding  Thrombocytopenia (HCC)   Acute on chronic renal insufficiency   Hypokalemia   Endometrial adenocarcinoma (HCC)   Left humeral fracture   Iron deficiency anemia due to chronic blood loss   Pressure injury of skin    1. MSSA bacteremia.transthoracic echocardiography with no vegetations. Follow up blood cultures from 03/11 continue with no growth #>48 H. Wbc is worsening and today up to 18,2. Patient with very poor dentition and swallow dysfunction, not able to complete TEE.   Will order esophagogram for further diagnostics, andwill continue with cefazolin IV. Contact dental for possible intervention. If patient has no endocarditis, possibly can place only a midline for the duration of antibiotic therapy.   2. Severe symptomatic anemia due to acute on chronic blood loss due to vaginal bleeding/ thrombocytopenia/ iron deficiency/ new diagnosed endometrial adenocarcinoma.Hgb todayat8,0 with Hct at2.6 and Plt424.Only minimal vaginal bleeding.  On Megace 800 mg bid per GYN oncology, with recommendations for follow up as outpatient.  Oniron supplementation.  3. HTN.Continue with amlodipine and labetalol for blood pressure control.   4. T2DM/ dyslipidemia .Capillary glucose has been well controlled, will continue with insulin sliding scale. Patient with poor oral intake due to dysphagia and dental pain.   Continue withatorvastatin.  5. CKD stage 3bwith non gap metabolic acidosis. Her renal function has remained stable, will follow on renal panel in am, patient with poor oral intake.  6. Obesity class 2, healing proximal left humeral fracture.BMI is 32. Onoxycodone and topical diclofenac for pain control. Continue with sling support for comfort, continue with physical therapy.   7. Positive superficial vein thrombosis involving the left cephalic vein.Analgesics and supportive therapy.   8. Dental pain. Panoramic films with very poor dentition, no frank abscess,  will reach dental for consultation.   9. Stage 2 pressure ulcer. Not present on admission, will  continue local wound care.   DVT prophylaxis:scd Code Status:full Family Communication:no family at the bedside. I called her husband but no answer on the phone.  Disposition Plan/ discharge barriers:patient from home, barrier for dc severe decondition, bacteremia, requiring IV antibiotic therapy and further advanced diagnostics with TEE.  Consultants:  ID  GYN  Oncology  Procedures:  Endometrial biopsy  Antimicrobials:  Cefazolin      Skin Documentation: Pressure Injury 09/22/19  Medial Stage 2 -  Partial thickness loss of dermis presenting as a shallow open injury with a red, pink wound bed without slough. (Active)  09/22/19 0341  Location:   Location Orientation: Medial  Staging: Stage 2 -  Partial thickness loss of dermis presenting as a shallow open injury with a red, pink wound bed without slough.  Wound Description (Comments):   Present on Admission:    Consultants:  ID  GYN  Oncology  Procedures:  Endometrial biopsy  Antimicrobials:  Cefazolin   Subjective: Patient continue to have dental pain and dysphagia, not able to complete TEE, no nausea or vomiting, requesting a liquid diet. Left arm pain is better controlled, continue to feel very weak and deconditioned.   Objective: Vitals:   09/22/19 1337 09/22/19 2256 09/23/19 0452 09/23/19 1108  BP: 127/67 137/71 (!) 154/87 (!) 180/74  Pulse: 76 77 73 78  Resp: 17 18 19 13   Temp: 97.9 F (36.6 C) 98.5 F (36.9 C) 97.9 F (36.6 C) 98.2 F (36.8 C)  TempSrc: Oral Oral Oral Oral  SpO2: 100% 100% 100% 100%  Weight:    76.8 kg  Height:    5' (1.524 m)    Intake/Output Summary (Last 24 hours) at 09/23/2019 1123 Last data filed at 09/23/2019 1042 Gross per 24 hour  Intake 624 ml  Output 300 ml  Net 324 ml   Filed Weights   09/20/19 0630 09/21/19 0500 09/23/19 1108    Weight: 77.6 kg 76.8 kg 76.8 kg    Examination:   General: Not in pain or dyspnea, deconditioned  Neurology: Awake and alert, non focal  E ENT: no pallor, no icterus, oral mucosa moist/ poor dentition Cardiovascular: No JVD. S1-S2 present, rhythmic, no gallops, rubs, or murmurs. Non pitting bilateral lower extremity edema. Pulmonary: positive breath sounds bilaterally, adequate air movement, no wheezing, rhonchi or rales. Gastrointestinal. Abdomen with no organomegaly, non tender, no rebound or guarding Skin. Stage 2 pressure ulcer.  Musculoskeletal: no joint deformities     Data Reviewed: I have personally reviewed following labs and imaging studies  CBC: Recent Labs  Lab 09/19/19 0220 09/20/19 0840 09/21/19 0242 09/22/19 0423 09/23/19 0326  WBC 16.3* 13.3* 14.2* 17.2* 18.2*  NEUTROABS 15.0* 11.5* 12.1* 14.5* 14.9*  HGB 7.4* 8.0* 7.9* 7.8* 8.0*  HCT 24.7* 26.3* 25.7* 26.0* 26.6*  MCV 85.2 86.2 84.8 86.1 85.5  PLT 79* 125* 194 275 161*   Basic Metabolic Panel: Recent Labs  Lab 09/18/19 0255 09/19/19 0220 09/20/19 0329 09/21/19 0242 09/22/19 0423  NA 142 140 143 140 140  K 4.4 4.5 4.0 3.9 3.7  CL 115* 112* 112* 112* 113*  CO2 18* 16* 17* 17* 19*  GLUCOSE 154* 103* 120* 150* 98  BUN 30* 33* 36* 30* 29*  CREATININE 2.12* 2.01* 1.87* 1.78* 1.68*  CALCIUM 8.8* 8.8* 8.8* 8.3* 8.2*   GFR: Estimated Creatinine Clearance: 30.6 mL/min (A) (by C-G formula based on SCr of 1.68 mg/dL (H)). Liver Function Tests: Recent Labs  Lab 09/17/19 0317  AST 14*  ALT  15  ALKPHOS 13*  BILITOT 0.8  PROT 6.8  ALBUMIN 2.6*   No results for input(s): LIPASE, AMYLASE in the last 168 hours. No results for input(s): AMMONIA in the last 168 hours. Coagulation Profile: Recent Labs  Lab 09/17/19 0843  INR 1.1   Cardiac Enzymes: No results for input(s): CKTOTAL, CKMB, CKMBINDEX, TROPONINI in the last 168 hours. BNP (last 3 results) No results for input(s): PROBNP in the last  8760 hours. HbA1C: No results for input(s): HGBA1C in the last 72 hours. CBG: Recent Labs  Lab 09/22/19 0811 09/22/19 1138 09/22/19 1730 09/22/19 2143 09/23/19 0739  GLUCAP 127* 108* 117* 132* 115*   Lipid Profile: No results for input(s): CHOL, HDL, LDLCALC, TRIG, CHOLHDL, LDLDIRECT in the last 72 hours. Thyroid Function Tests: No results for input(s): TSH, T4TOTAL, FREET4, T3FREE, THYROIDAB in the last 72 hours. Anemia Panel: No results for input(s): VITAMINB12, FOLATE, FERRITIN, TIBC, IRON, RETICCTPCT in the last 72 hours.    Radiology Studies: I have reviewed all of the imaging during this hospital visit personally     Scheduled Meds: . [MAR Hold] amLODipine  10 mg Oral Daily  . [MAR Hold] atorvastatin  40 mg Oral Daily  . [MAR Hold] diclofenac Sodium  2 g Topical QID  . [MAR Hold] ferrous sulfate  325 mg Oral BID WC  . influenza vaccine adjuvanted  0.5 mL Intramuscular Tomorrow-1000  . [MAR Hold] insulin aspart  0-15 Units Subcutaneous TID WC  . [MAR Hold] insulin aspart  0-5 Units Subcutaneous QHS  . [MAR Hold] labetalol  200 mg Oral TID  . [MAR Hold] megestrol  80 mg Oral BID  . pneumococcal 23 valent vaccine  0.5 mL Intramuscular Tomorrow-1000  . [MAR Hold] polyethylene glycol  17 g Oral BID   Continuous Infusions: . sodium chloride    . [MAR Hold]  ceFAZolin (ANCEF) IV 2 g (09/22/19 2312)     LOS: 9 days        Joselynn Amoroso Gerome Apley, MD

## 2019-09-23 NOTE — Anesthesia Preprocedure Evaluation (Signed)
Anesthesia Evaluation  Patient identified by MRN, date of birth, ID band Patient awake    Reviewed: Allergy & Precautions, H&P , NPO status , Patient's Chart, lab work & pertinent test results  Airway Mallampati: III  TM Distance: >3 FB Neck ROM: Full    Dental no notable dental hx. (+) Poor Dentition, Dental Advisory Given   Pulmonary asthma ,    Pulmonary exam normal breath sounds clear to auscultation       Cardiovascular hypertension, + Valvular Problems/Murmurs MR  Rhythm:Regular Rate:Normal     Neuro/Psych negative neurological ROS  negative psych ROS   GI/Hepatic negative GI ROS, Neg liver ROS,   Endo/Other  diabetes, Insulin Dependent  Renal/GU Renal InsufficiencyRenal disease  negative genitourinary   Musculoskeletal   Abdominal   Peds  Hematology  (+) Blood dyscrasia, anemia ,   Anesthesia Other Findings   Reproductive/Obstetrics negative OB ROS                             Anesthesia Physical Anesthesia Plan  ASA: III  Anesthesia Plan: MAC   Post-op Pain Management:    Induction: Intravenous  PONV Risk Score and Plan: 2 and Propofol infusion and Treatment may vary due to age or medical condition  Airway Management Planned: Nasal Cannula  Additional Equipment:   Intra-op Plan:   Post-operative Plan:   Informed Consent: I have reviewed the patients History and Physical, chart, labs and discussed the procedure including the risks, benefits and alternatives for the proposed anesthesia with the patient or authorized representative who has indicated his/her understanding and acceptance.     Dental advisory given  Plan Discussed with: CRNA  Anesthesia Plan Comments:         Anesthesia Quick Evaluation

## 2019-09-23 NOTE — Transfer of Care (Signed)
Immediate Anesthesia Transfer of Care Note  Patient: Vanessa [name]  Procedure(s) Performed: TRANSESOPHAGEAL ECHOCARDIOGRAM (TEE) (N/A )  Patient Location: Endoscopy Unit  Anesthesia Type:MAC  Level of Consciousness: drowsy and patient cooperative  Airway & Oxygen Therapy: Patient Spontanous Breathing  Post-op Assessment: Report given to RN, Post -op Vital signs reviewed and stable and Patient moving all extremities X 4  Post vital signs: Reviewed and stable  Last Vitals:  Vitals Value Taken Time  BP    Temp    Pulse    Resp    SpO2      Last Pain:  Vitals:   09/23/19 1108  TempSrc: Oral  PainSc: 10-Worst pain ever      Patients Stated Pain Goal: 3 (08/22/15 3567)  Complications: No apparent anesthesia complications

## 2019-09-23 NOTE — CV Procedure (Signed)
   Transesophageal Echocardiogram Note  Vanessa [name] [id number] [birth year]  Procedure: Transesophageal Echocardiogram Indications: Bacteremia   Procedure Details Consent: Obtained Time Out: Verified patient identification, verified procedure, site/side was marked, verified correct patient position, special equipment/implants available, Radiology Safety Procedures followed,  medications/allergies/relevent history reviewed, required imaging and test results available.  Performed  Medications: IV   Aborted TEE, I was unable to pass the probe. The patient states that she has been having hard time swallowing food. GI evaluation for potential EGD is recommended.  Also, the patient has several very loose teeth that are at risk of falling and causing aspiration during the procedure. They are most probably cause of her bacteremia. A dental evaluation is strongly recommended.   Complications: Complications of unable to pass the probe Patient did tolerate procedure well.  Ena Dawley, MD, [facility] 09/23/2019, 12:33 PM

## 2019-09-23 NOTE — Progress Notes (Signed)
PHARMACY CONSULT NOTE FOR:  OUTPATIENT  PARENTERAL ANTIBIOTIC THERAPY (OPAT)  Indication: MSSA bacteremia Regimen: Cefazolin (Ancef) 2g IV q12h End date: 10/31/2019  IV antibiotic discharge orders are pended. To discharging provider:  please sign these orders via discharge navigator,  Select New Orders & click on the button choice - Manage This Unsigned Work.     Thank you for allowing pharmacy to be a part of this patient's care.  Claudina Lick, PharmD Candidate 09/23/2019, 1:53 PM

## 2019-09-23 NOTE — Plan of Care (Signed)
  Problem: Education: Goal: Knowledge of General Education information will improve Description Including pain rating scale, medication(s)/side effects and non-pharmacologic comfort measures Outcome: Progressing   

## 2019-09-23 NOTE — Anesthesia Postprocedure Evaluation (Signed)
Anesthesia Post Note  Patient: Vanessa [name]  Procedure(s) Performed: TRANSESOPHAGEAL ECHOCARDIOGRAM (TEE) (N/A ) Aborted TEE     Patient location during evaluation: Endoscopy Anesthesia Type: MAC Level of consciousness: awake and alert Pain management: pain level controlled Vital Signs Assessment: post-procedure vital signs reviewed and stable Respiratory status: spontaneous breathing, nonlabored ventilation and respiratory function stable Cardiovascular status: stable and blood pressure returned to baseline Postop Assessment: no apparent nausea or vomiting Anesthetic complications: no    Last Vitals:  Vitals:   09/23/19 1259 09/23/19 1312  BP: (!) 166/71 (!) 172/84  Pulse:  81  Resp: 15 17  Temp:  36.9 C  SpO2: 99% 100%    Last Pain:  Vitals:   09/23/19 1312  TempSrc: Oral  PainSc:                  Leander Tout,W. EDMOND

## 2019-09-23 NOTE — Progress Notes (Signed)
Pt to procedure.

## 2019-09-23 NOTE — Progress Notes (Signed)
Physical Therapy Treatment Patient Details Name: Vanessa [name] MRN: [id number] DOB: Aug 19, 1955 Today's Date: 09/23/2019    History of Present Illness Pt is a 64 y/o female with PMH of HTN, type 2 DM, asthma presenting to ED with vaginal bleeding, progressive SOB and weakness over the last 3 weeks. Admitted with severe symptomatic anemia. Found with thrombocytopenia. CT negative, Found with L proximal humeral fracture healing (not acute) per xray. 3/12 UE venous study ordered to r/o DVT    PT Comments    Pt demonstrates significant progress towards functional goals today. She was able to perform self hygiene following toileting and progress to [facility] for improved ambulation tolerance today. She continues to be limited by high fall risk, requiring PT assistance 2x to prevent falls and required 1 30 second standing rest break against a wall.  Plan to d/c to SNF to progress balance training, strengthening and reduced fall risk to improve safety and reduce fall risk continues to be the most appropriate discharge plan. PT will continue following patient as indicated.  Follow Up Recommendations  SNF;Supervision/Assistance - 24 hour     Equipment Recommendations  Other (comment)       Precautions / Restrictions Precautions Precautions: Fall Shoulder Interventions: Shoulder sling/immobilizer;For comfort Precaution Comments: L proximal humerus fx healing (not acute)- per chat text with Dr. Cathlean Sauer (09/20/2019) pt can use arm functionally and WBAT, sling for comfort Required Braces or Orthoses: Sling Restrictions Weight Bearing Restrictions: Yes LUE Weight Bearing: Weight bearing as tolerated    Mobility  Bed Mobility Overal bed mobility: Needs Assistance             General bed mobility comments: Pt OOB on toilet upon arrival. Pt able to self clean today with CGA back to recliner with RW.  Transfers Overall transfer level: Needs assistance Equipment used: Straight cane Transfers: Sit  to/from Stand Sit to Stand: Min guard         General transfer comment: VCs for sequencing and safety when standing  Ambulation/Gait Ambulation/Gait assistance: Min assist Gait Distance (Feet): 125 Feet Assistive device: Straight cane Gait Pattern/deviations: Step-to pattern;Decreased stride length;Staggering left;Staggering right;Wide base of support Gait velocity: decreased   General Gait Details: Pt able to ambulate with SPC with 2 near LOB (posterior) requiring minA to prevent fall today      Balance Overall balance assessment: Needs assistance Sitting-balance support: Feet supported Sitting balance-Leahy Scale: Good     Standing balance support: Single extremity supported Standing balance-Leahy Scale: Fair Standing balance comment: pt relies on RUE support to ambulate. unable to stand SLS without external support (corner/walls)   Single Leg Stance - Left Leg: 1        Cognition Arousal/Alertness: Awake/alert Behavior During Therapy: WFL for tasks assessed/performed Overall Cognitive Status: Within Functional Limits for tasks assessed Area of Impairment: Awareness;Safety/judgement     Current Attention Level: Focused     Safety/Judgement: Decreased awareness of safety;Decreased awareness of deficits Awareness: Intellectual   General Comments: patient oriented fully today. Poor awareness to deficits and safety.         PT Goals (current goals can now be found in the care plan section) Acute Rehab PT Goals Patient Stated Goal: walk PT Goal Formulation: With patient Time For Goal Achievement: 10/02/19 Potential to Achieve Goals: Good Progress towards PT goals: Progressing toward goals    Frequency    Min 3X/week      PT Plan Current plan remains appropriate       AM-PAC PT "6  Clicks" Mobility   Outcome Measure  Help needed turning from your back to your side while in a flat bed without using bedrails?: A Little Help needed moving from lying  on your back to sitting on the side of a flat bed without using bedrails?: A Little Help needed moving to and from a bed to a chair (including a wheelchair)?: A Little Help needed standing up from a chair using your arms (e.g., wheelchair or bedside chair)?: A Little Help needed to walk in hospital room?: A Little Help needed climbing 3-5 steps with a railing? : A Lot 6 Click Score: 17    End of Session Equipment Utilized During Treatment: Gait belt Activity Tolerance: Patient limited by fatigue;Patient tolerated treatment well Patient left: in chair;with call bell/phone within reach;with chair alarm set Nurse Communication: Mobility status PT Visit Diagnosis: Unsteadiness on feet (R26.81);Muscle weakness (generalized) (M62.81);Difficulty in walking, not elsewhere classified (R26.2);Pain Pain - Right/Left: Left Pain - part of body: Arm     Time: 1329-1400 PT Time Calculation (min) (ACUTE ONLY): 31 min  Charges:  $Gait Training: 8-22 mins $Therapeutic Activity: 8-22 mins                     Ann Held PT, DPT Acute Rehab [facility] P: [phone]    Danton Palmateer A Shaune Malacara 09/23/2019, 2:10 PM

## 2019-09-24 ENCOUNTER — Ambulatory Visit: Payer: Medicare Other | Admitting: Gynecologic Oncology

## 2019-09-24 ENCOUNTER — Inpatient Hospital Stay (HOSPITAL_COMMUNITY): Payer: Medicare Other

## 2019-09-24 LAB — BASIC METABOLIC PANEL
Anion gap: 10 (ref 5–15)
BUN: 24 mg/dL — ABNORMAL HIGH (ref 8–23)
CO2: 21 mmol/L — ABNORMAL LOW (ref 22–32)
Calcium: 8.4 mg/dL — ABNORMAL LOW (ref 8.9–10.3)
Chloride: 109 mmol/L (ref 98–111)
Creatinine, Ser: 1.77 mg/dL — ABNORMAL HIGH (ref 0.44–1.00)
GFR calc Af Amer: 34 mL/min — ABNORMAL LOW (ref >60)
GFR calc non Af Amer: 30 mL/min — ABNORMAL LOW (ref >60)
Glucose, Bld: 84 mg/dL (ref 70–99)
Potassium: 4.2 mmol/L (ref 3.5–5.1)
Sodium: 140 mmol/L (ref 135–145)

## 2019-09-24 LAB — CBC WITH DIFFERENTIAL/PLATELET
Abs Immature Granulocytes: 0.39 10*3/uL — ABNORMAL HIGH (ref 0.00–0.07)
Basophils Absolute: 0.2 10*3/uL — ABNORMAL HIGH (ref 0.0–0.1)
Basophils Relative: 1 %
Eosinophils Absolute: 0.2 10*3/uL (ref 0.0–0.5)
Eosinophils Relative: 1 %
HCT: 25.8 % — ABNORMAL LOW (ref 36.0–46.0)
Hemoglobin: 7.7 g/dL — ABNORMAL LOW (ref 12.0–15.0)
Immature Granulocytes: 2 %
Lymphocytes Relative: 6 %
Lymphs Abs: 1.2 10*3/uL (ref 0.7–4.0)
MCH: 26 pg (ref 26.0–34.0)
MCHC: 29.8 g/dL — ABNORMAL LOW (ref 30.0–36.0)
MCV: 87.2 fL (ref 80.0–100.0)
Monocytes Absolute: 1 10*3/uL (ref 0.1–1.0)
Monocytes Relative: 5 %
Neutro Abs: 15 10*3/uL — ABNORMAL HIGH (ref 1.7–7.7)
Neutrophils Relative %: 85 %
Platelets: 564 10*3/uL — ABNORMAL HIGH (ref 150–400)
RBC: 2.96 MIL/uL — ABNORMAL LOW (ref 3.87–5.11)
RDW: 22.7 % — ABNORMAL HIGH (ref 11.5–15.5)
WBC: 17.9 10*3/uL — ABNORMAL HIGH (ref 4.0–10.5)
nRBC: 0 % (ref 0.0–0.2)

## 2019-09-24 LAB — CULTURE, BLOOD (ROUTINE X 2)
Culture: NO GROWTH
Culture: NO GROWTH
Special Requests: ADEQUATE

## 2019-09-24 LAB — SARS CORONAVIRUS 2 (TAT 6-24 HRS): SARS Coronavirus 2: NEGATIVE

## 2019-09-24 LAB — GLUCOSE, CAPILLARY
Glucose-Capillary: 88 mg/dL (ref 70–99)
Glucose-Capillary: 83 mg/dL (ref 70–99)
Glucose-Capillary: 140 mg/dL — ABNORMAL HIGH (ref 70–99)

## 2019-09-24 NOTE — Consult Note (Signed)
[facility] Consultation Note  Referring Provider: [facility] Primary Care Physician:  Maren Reamer, MD (Inactive)  Reason for Consultation:  dysphagia  HPI: Vanessa [name] is a 64 y.o. female whom we've been asked to see for dysphagia.  She was admitted with menorrhagia and symptoms ultimately found to be related to endocarditis.  Cardiology unable to pass TEE probe; esophagram non-specific dysmotility, stricture not seen but couldn't be excluded.  Patient has had several months of vague dysphagia, principally "choking" (coughing post-glutition); she can swallow all liquids and chopped meats without dysphagia (without food being stuck in esophagus).   Past Medical History:  Diagnosis Date  . Asthma   . Diabetes mellitus without complication ([place])   . Heart murmur   . Hypertension     Past Surgical History:  Procedure Laterality Date  . TYMPANOSTOMY TUBE PLACEMENT Bilateral     Prior to Admission medications   Medication Sig Start Date End Date Taking? Authorizing Provider  acetaminophen (TYLENOL) 500 MG tablet Take 500 mg by mouth every 6 (six) hours as needed for moderate pain.   Yes [provider]  aspirin EC 325 MG tablet Take 325 mg by mouth every 6 (six) hours as needed for moderate pain.   Yes [provider]  aspirin 81 MG tablet Take 1 tablet (81 mg total) by mouth daily. Patient not taking: Reported on 09/14/2019 11/29/16   Lottie Mussel T, MD  Blood Glucose Monitoring Suppl (BAYER CONTOUR MONITOR) w/Device KIT Use as directed for 3 times daily testing of blood glucose. E11.9 12/01/16   Langeland, Leda Quail, MD  fluticasone (FLONASE) 50 MCG/ACT nasal spray Place 2 sprays into both nostrils daily. Patient not taking: Reported on 09/14/2019 11/29/16   Lottie Mussel T, MD  glucose blood (BAYER CONTOUR TEST) test strip Use as instructed for 3 times daily testing of blood glucose. E11.9 12/01/16   Maren Reamer, MD  hydrochlorothiazide (HYDRODIURIL) 25  MG tablet Take 1 tablet (25 mg total) by mouth daily. Patient not taking: Reported on 09/14/2019 11/29/16   Maren Reamer, MD  insulin NPH-regular Human (NOVOLIN 70/30) (70-30) 100 UNIT/ML injection Take 50 units in the morning and 45 units at night. Patient not taking: Reported on 09/14/2019 11/29/16   Lottie Mussel T, MD  INSULIN SYRINGE 1CC/29G 29G X 1/2" 1 ML MISC Insulin syringes 03/06/14   Rolene Course, PA-C  Lancets [facility] ULTRASOFT) lancets Use as instructed 11/29/16   Lottie Mussel T, MD  lisinopril (PRINIVIL,ZESTRIL) 40 MG tablet Take 1 tablet (40 mg total) by mouth daily. Patient not taking: Reported on 09/14/2019 11/29/16   Maren Reamer, MD  loratadine (CLARITIN) 10 MG tablet Take 1 tablet (10 mg total) by mouth daily. Patient not taking: Reported on 09/14/2019 11/29/16   Lottie Mussel T, MD  metFORMIN (GLUCOPHAGE XR) 500 MG 24 hr tablet Take 1 tablet (500 mg total) by mouth 2 (two) times daily. Patient not taking: Reported on 09/14/2019 11/29/16   Maren Reamer, MD    Current Facility-Administered Medications  Medication Dose Route Frequency Provider Last Rate Last Admin  . acetaminophen (TYLENOL) tablet 650 mg  650 mg Oral Q6H PRN Dorothy Spark, MD   650 mg at 09/23/19 0045   Or  . acetaminophen (TYLENOL) suppository 650 mg  650 mg Rectal Q6H PRN Dorothy Spark, MD      . amLODipine (NORVASC) tablet 10 mg  10 mg Oral Daily Dorothy Spark, MD   10 mg at 09/24/19  1045  . atorvastatin (LIPITOR) tablet 40 mg  40 mg Oral Daily Dorothy Spark, MD   40 mg at 09/24/19 1045  . benzocaine (ORAJEL) 10 % mucosal gel 1 application  1 application Mouth/Throat QID PRN Dorothy Spark, MD   1 application at 59/16/38 2114  . ceFAZolin (ANCEF) IVPB 2g/100 mL premix  2 g Intravenous Q12H Dorothy Spark, MD 200 mL/hr at 09/24/19 1053 2 g at 09/24/19 1053  . Chlorhexidine Gluconate Cloth 2 % PADS 6 each  6 each Topical Daily Arrien, Jimmy Picket, MD   6 each at  09/24/19 1052  . diclofenac Sodium (VOLTAREN) 1 % topical gel 2 g  2 g Topical QID Dorothy Spark, MD   2 g at 09/24/19 1053  . diphenhydrAMINE-zinc acetate (BENADRYL) 2-0.1 % cream   Topical TID PRN Dorothy Spark, MD   Given at 09/19/19 1027  . ferrous sulfate tablet 325 mg  325 mg Oral BID WC Dorothy Spark, MD   325 mg at 09/23/19 1820  . hydrOXYzine (ATARAX/VISTARIL) tablet 25 mg  25 mg Oral Q6H PRN Dorothy Spark, MD   25 mg at 09/19/19 0346  . influenza vaccine adjuvanted (FLUAD) injection 0.5 mL  0.5 mL Intramuscular Tomorrow-1000 Schertz, Michele Mcalpine, MD      . insulin aspart (novoLOG) injection 0-15 Units  0-15 Units Subcutaneous TID WC Dorothy Spark, MD   2 Units at 09/22/19 [phone]  . insulin aspart (novoLOG) injection 0-5 Units  0-5 Units Subcutaneous QHS Ena Dawley H, MD      . labetalol (NORMODYNE) tablet 200 mg  200 mg Oral TID Dorothy Spark, MD   200 mg at 09/24/19 1044  . megestrol (MEGACE) tablet 80 mg  80 mg Oral BID Dorothy Spark, MD   80 mg at 09/24/19 1052  . ondansetron (ZOFRAN) tablet 4 mg  4 mg Oral Q6H PRN Dorothy Spark, MD       Or  . ondansetron [facility]) injection 4 mg  4 mg Intravenous Q6H PRN Dorothy Spark, MD      . oxyCODONE (Oxy IR/ROXICODONE) immediate release tablet 5 mg  5 mg Oral Q4H PRN Dorothy Spark, MD   5 mg at 09/23/19 0444  . pneumococcal 23 valent vaccine (PNEUMOVAX-23) injection 0.5 mL  0.5 mL Intramuscular Tomorrow-1000 Schertz, Michele Mcalpine, MD      . polyethylene glycol (MIRALAX / GLYCOLAX) packet 17 g  17 g Oral BID Dorothy Spark, MD   17 g at 09/24/19 1039  . sodium chloride flush (NS) 0.9 % injection 10-40 mL  10-40 mL Intracatheter Q12H Arrien, Jimmy Picket, MD      . sodium chloride flush (NS) 0.9 % injection 10-40 mL  10-40 mL Intracatheter PRN Arrien, Jimmy Picket, MD   10 mL at 09/23/19 2137    Allergies as of 09/14/2019  . (No Known Allergies)    Family History  Problem Relation  Age of Onset  . Stroke Mother   . Diabetes Mother   . Hypertension Mother   . Diabetes Brother   . Hypertension Brother     Social History   Socioeconomic History  . Marital status: Married    Spouse name: Not on file  . Number of children: Not on file  . Years of education: Not on file  . Highest education level: Not on file  Occupational History  . Not on file  Tobacco Use  . Smoking status: Never  Smoker  . Smokeless tobacco: Never Used  Substance and Sexual Activity  . Alcohol use: No  . Drug use: No  . Sexual activity: Never  Other Topics Concern  . Not on file  Social History Narrative  . Not on file   Social Determinants of Health   Financial Resource Strain:   . Difficulty of Paying Living Expenses:   Food Insecurity:   . Worried About Charity fundraiser in the Last Year:   . Arboriculturist in the Last Year:   Transportation Needs:   . Film/video editor (Medical):   Marland Kitchen Lack of Transportation (Non-Medical):   Physical Activity:   . Days of Exercise per Week:   . Minutes of Exercise per Session:   Stress:   . Feeling of Stress :   Social Connections:   . Frequency of Communication with Friends and Family:   . Frequency of Social Gatherings with Friends and Family:   . Attends Religious Services:   . Active Member of Clubs or Organizations:   . Attends Archivist Meetings:   Marland Kitchen Marital Status:   Intimate Partner Violence:   . Fear of Current or Ex-Partner:   . Emotionally Abused:   Marland Kitchen Physically Abused:   . Sexually Abused:     Review of Systems: As per HPI, all others negative  Physical Exam: Vital signs in last 24 hours: Temp:  [97.6 F (36.4 C)-99.6 F (37.6 C)] 97.6 F (36.4 C) (03/16 0449) Pulse Rate:  [72-81] 73 (03/16 1043) Resp:  [13-20] 17 (03/16 1043) BP: (136-191)/(59-100) 148/100 (03/16 1043) SpO2:  [99 %-100 %] 100 % (03/16 1043) Last BM Date: 09/23/19 General:   Alert,  Well-developed, well-nourished, pleasant and  cooperative in NAD Head:  Normocephalic and atraumatic. Eyes:  Sclera clear, no icterus.   Conjunctiva pink. Ears:  Normal auditory acuity. Nose:  No deformity, discharge,  or lesions. Mouth:  No deformity or lesions.  Oropharynx pale and dry; poor dentition; several caries and loose teeth Neck:  Supple; no masses or thyromegaly. Lungs:  Clear throughout to auscultation.   No wheezes, crackles, or rhonchi. No acute distress. Heart:  Regular rate and rhythm; no murmurs, clicks, rubs,  or gallops. Abdomen:  Soft, nontender and nondistended. No masses, hepatosplenomegaly or hernias noted. Normal bowel sounds, without guarding, and without rebound.     Msk:  Left shoulder in sling from injury months ago; Symmetrical without gross deformities. Normal posture. Pulses:  Normal pulses noted. Extremities:  Without clubbing or edema. Neurologic:  Alert and  oriented x4;  grossly normal neurologically. Skin:  Intact without significant lesions or rashes.. Psych:  Alert and cooperative. Normal mood and affect.   Lab Results: Recent Labs    09/22/19 0423 09/23/19 0326 09/24/19 0405  WBC 17.2* 18.2* 17.9*  HGB 7.8* 8.0* 7.7*  HCT 26.0* 26.6* 25.8*  PLT 275 424* 564*   BMET Recent Labs    09/22/19 0423 09/24/19 0405  NA 140 140  K 3.7 4.2  CL 113* 109  CO2 19* 21*  GLUCOSE 98 84  BUN 29* 24*  CREATININE 1.68* 1.77*  CALCIUM 8.2* 8.4*   LFT No results for input(s): PROT, ALBUMIN, AST, ALT, ALKPHOS, BILITOT, BILIDIR, IBILI in the last 72 hours. PT/INR No results for input(s): LABPROT, INR in the last 72 hours.  Studies/Results: DG ESOPHAGUS W SINGLE CM (SOL OR THIN BA)  Result Date: 09/24/2019 CLINICAL DATA:  64 year old female with history of unsuccessful TEE yesterday.  Nausea and vomiting intermittently. EXAM: ESOPHOGRAM/BARIUM SWALLOW TECHNIQUE: Single contrast examination was performed using thin barium or water soluble. FLUOROSCOPY TIME:  Fluoroscopy Time:  48 seconds Radiation  Exposure Index (if provided by the fluoroscopic device): 3.8 mGy Number of Acquired Spot Images: 0 COMPARISON:  None. FINDINGS: Limited single contrast study was performed due to lack of patient mobility. Within the limitations of today's examination, there was no evidence of esophageal mass, esophageal ring or hiatal hernia. The distal esophagus shortly before the gastroesophageal junction never fully distended during the examination. Failure to normally propagate primary peristaltic waves was noted, with occasional tertiary contractions. A barium tablet was not administered as the patient cannot tolerate swallowing pills. IMPRESSION: 1. 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 2. Failure to fully distend the distal esophagus shortly before the gastroesophageal junction. The possibility of a distal esophageal stricture is not entirely excluded. Unfortunately, the patient could not tolerate ingestion of a barium tablet to better evaluate this finding. If there is clinical concern for distal esophageal stricture, further evaluation with nonemergent endoscopy should be considered. Electronically Signed   By: Vinnie Langton M.D.   On: 09/24/2019 09:24    Impression:  1.  Dysphagia.  Non-specific motility disorder on esophagram; no clear stricture seen, but could not be excluded.  Unable to pass TEE scope. 2.  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 3.  Anemia, endocarditis-related?  Plan:  1.  Patient reports she is able to swallow, without dysphagia, all liquids and solids even like chicken/steak when they are cut in small pieces.  She does report being "choked," by which she means she coughs some after swallowing; but denies sensation of food or liquid being "stuck" in the esophagus post-glutition. 2.  Patient high risk for dislodging tooth during endoscopy, or even aspirating a tooth during the  procedure; her symptoms are chronic and not acute, and thus I couldn't justify intubation in order to do a semi-elective endoscopy in setting of patient with acute infective endocarditis. 3.  I would recommend inpatient dental consult.  I would not pursue endoscopy, as inpatient or outpatient, until her dentition has been fully addressed. 4.  Soft, well-chopped dysphagia 3-type diet would be appropriate. 5.  No further recommendations at this point; call us back if, after all of her loose teeth have been fixed, if you'd like [place] to do endoscopy.  Otherwise, please call [phone] and we can follow up with her as outpatient both for evaluation of her anemia and her dysphagia.  6.  Thank you for the consultation.   LOS: 10 days   Maysun Meditz M  09/24/2019, 11:17 AM  Cell [phone] If no answer or after 5 PM call [phone]

## 2019-09-24 NOTE — Progress Notes (Signed)
PROGRESS NOTE    Vanessa [name]  NTZ:[id number] DOB: February 15, 1956 DOA: 09/14/2019 PCP: Maren Reamer, MD (Inactive)    Brief Narrative:  Patient was admitted to the hospital with a working diagnosis of symptomatic anemia in the setting of transvaginal bleedingnewly diagnosed endometrial cancer.Hospitalization complicated with MSSA bacteremia  64 year old female who presented with vaginal bleeding. She does have significant past medical history for hypertension, type 2 diabetes mellitus and asthma. Patient reportedno bleeding for several months. Positive dyspnea over the last 3 weeks prior to hospitalization. Physical examination her blood pressure was 179/79, heart rate 79, respiratory rate 19, temperature 97.7, oxygenation 100%,her lungs are clear to auscultation bilaterally, heart is also present rhythmic, soft abdomen, +2+ lower extremity edema. Sodium 140, potassium 3.1, chloride 112, bicarb 18, glucose 113, BUN 27, creatinine 1.9, white count 6.8, hemoglobin 4.8, hematocrit 17.6, platelets 115.  Patient has received total of 3 units packed red blood cells,1 dose of Feraheme. Her vaginal ultrasound had thickened endometrium. Patient has been placed on Megace perGYN service.  Patientunderwentinpatient endometrial biopsy, pathology positive for endometrial cancer.  On 03/09 #2/2 bottles blood cultures resulted positive for MSSA, follow up work up with echocardiogram showed no vegetations, patient placed on IV cefazolin. Pending results of follow up blood cultures.  Left upper extremity US negative for DVT, but positive superficial vein thrombosis involving the left cephalic vein, no indication for anticoagulation.  Unable to perform TEE due to esophageal stricture, further work up with esophagogram with possible distal stricture, consulted GI for possible endoscopic evaluation.    Assessment & Plan:   Principal Problem:   MSSA bacteremia Active Problems:   DM  type 2 (diabetes mellitus, type 2) (HCC)   HTN (hypertension)   Postmenopausal bleeding   Thrombocytopenia (HCC)   Acute on chronic renal insufficiency   Hypokalemia   Endometrial adenocarcinoma (HCC)   Left humeral fracture   Iron deficiency anemia due to chronic blood loss   Pressure injury of skin     1. MSSA bacteremia.transthoracic echocardiography with no vegetations. Follow up blood cultures from 03/11 continue with no growth. Wbc is down 17.9.   Will continue medical therapy with cefaxolin for 6 weeks per ID recommendations, patient has a PICC line on her right upper extremity.   2. Esophageal stritcure. Patient with dysphagia, unable to pass TEE probe, esophagogram suggestive for distal obstruction. Consulted GI this am for possible endoscopic evaluation.   3. Severe symptomatic anemia due to acute on chronic blood loss due to vaginal bleeding/ thrombocytopenia/ iron deficiency/ new diagnosed endometrial adenocarcinoma.Hgb has been stable at 7,7 with Hct at2.5 and Plt564.  Plan to continue with Megace 800 mg bid perGYN oncology, and follow up as outpatient.  Continue with oral iron supplementation.  3. HTN.Onamlodipine and labetalol for blood pressure control.   4. T2DM/ dyslipidemia .Fasting glucose this am at 84, will discontinue insulin therapy for now.   Onatorvastatin.  5. CKD stage 3bwith non gap metabolic acidosis.Renal function stable with serum cr at 1,77 with K at 4,2 and serum bicarbonate at 21. Will follow on renal panel in am.   6. Obesity class 2, healing proximal left humeral fracture.BMI is 32.Continue pain control withoxycodone and topical diclofenac. She has a  sling support for comfort,plan for SNF.    7. Positive superficial vein thrombosis involving the left cephalic vein.Continue with oral and topical analgesics.   8. Dental pain. Panoramic films with very poor dentition, no frank abscess,  I spoke with oral  surgery service  and will follow as outpatient post dc, within 48 H, will need referral at discharge.   9. Stage 2 pressure ulcer. Not present on admission, On local wound care.   10. Dysphagia with distal esophageal stricture. Change diet to full liquids, will continue antiacid therapy and GI has been consulted for evaluation.   DVT prophylaxis:scd Code Status:full Family Communication:no family at the bedside. I called her husband but no answer on the phone.  Disposition Plan/ discharge barriers:patient from home, barrier for dc severe deconditioning, bacteremia, requiring prolonged IV antibiotic therapy.   Consultants:  ID  GYN  Oncology  Procedures:  Endometrial biopsy  Failed TEE  Antimicrobials:  Cefazolin       Skin Documentation: Pressure Injury 09/22/19  Medial Stage 2 -  Partial thickness loss of dermis presenting as a shallow open injury with a red, pink wound bed without slough. (Active)  09/22/19 0341  Location:   Location Orientation: Medial  Staging: Stage 2 -  Partial thickness loss of dermis presenting as a shallow open injury with a red, pink wound bed without slough.  Wound Description (Comments):   Present on Admission:      Consultants:  ID  GYN  Oncology  Procedures:  Endometrial biopsy  Antimicrobials: Cefazolin   Subjective: Patient is feeling better, but continue to have dysphagia, no odynophagia, no nausea or vomiting, no chest pain or dyspnea. Continue to feel very weak and deconditioned.   Objective: Vitals:   09/23/19 1657 09/23/19 2126 09/23/19 2230 09/24/19 0449  BP: (!) 179/71 (!) 191/77 (!) 159/81 (!) 136/94  Pulse: 77 80 72 75  Resp: 17 18  18   Temp: 98.3 F (36.8 C) 99.6 F (37.6 C)  97.6 F (36.4 C)  TempSrc: Oral Oral  Oral  SpO2: 100% 100%  100%  Weight:      Height:        Intake/Output Summary (Last 24 hours) at 09/24/2019 0939 Last data filed at 09/24/2019 6433 Gross per  24 hour  Intake 920 ml  Output 850 ml  Net 70 ml   Filed Weights   09/20/19 0630 09/21/19 0500 09/23/19 1108  Weight: 77.6 kg 76.8 kg 76.8 kg    Examination:   General: deconditioned  Neurology: Awake and alert, non focal  E ENT: mild pallor, no icterus, oral mucosa moist Cardiovascular: No JVD. S1-S2 present, rhythmic, no gallops, rubs, or murmurs. Left upper extremity and bilateral lower extremities edema. Pulmonary: positive breath sounds bilaterally, adequate air movement, no wheezing, rhonchi or rales. Gastrointestinal. Abdomen with no organomegaly, non tender, no rebound or guarding Skin. No rashes Musculoskeletal: no joint deformities     Data Reviewed: I have personally reviewed following labs and imaging studies  CBC: Recent Labs  Lab 09/20/19 0840 09/21/19 0242 09/22/19 0423 09/23/19 0326 09/24/19 0405  WBC 13.3* 14.2* 17.2* 18.2* 17.9*  NEUTROABS 11.5* 12.1* 14.5* 14.9* 15.0*  HGB 8.0* 7.9* 7.8* 8.0* 7.7*  HCT 26.3* 25.7* 26.0* 26.6* 25.8*  MCV 86.2 84.8 86.1 85.5 87.2  PLT 125* 194 275 424* 295*   Basic Metabolic Panel: Recent Labs  Lab 09/19/19 0220 09/20/19 0329 09/21/19 0242 09/22/19 0423 09/24/19 0405  NA 140 143 140 140 140  K 4.5 4.0 3.9 3.7 4.2  CL 112* 112* 112* 113* 109  CO2 16* 17* 17* 19* 21*  GLUCOSE 103* 120* 150* 98 84  BUN 33* 36* 30* 29* 24*  CREATININE 2.01* 1.87* 1.78* 1.68* 1.77*  CALCIUM 8.8* 8.8* 8.3* 8.2* 8.4*  GFR: Estimated Creatinine Clearance: 29 mL/min (A) (by C-G formula based on SCr of 1.77 mg/dL (H)). Liver Function Tests: No results for input(s): AST, ALT, ALKPHOS, BILITOT, PROT, ALBUMIN in the last 168 hours. No results for input(s): LIPASE, AMYLASE in the last 168 hours. No results for input(s): AMMONIA in the last 168 hours. Coagulation Profile: Recent Labs  Lab 09/17/19 0843  INR 1.1   Cardiac Enzymes: No results for input(s): CKTOTAL, CKMB, CKMBINDEX, TROPONINI in the last 168 hours. BNP (last 3  results) No results for input(s): PROBNP in the last 8760 hours. HbA1C: No results for input(s): HGBA1C in the last 72 hours. CBG: Recent Labs  Lab 09/23/19 1119 09/23/19 1405 09/23/19 1640 09/23/19 2134 09/24/19 0744  GLUCAP 80 85 99 80 88   Lipid Profile: No results for input(s): CHOL, HDL, LDLCALC, TRIG, CHOLHDL, LDLDIRECT in the last 72 hours. Thyroid Function Tests: No results for input(s): TSH, T4TOTAL, FREET4, T3FREE, THYROIDAB in the last 72 hours. Anemia Panel: No results for input(s): VITAMINB12, FOLATE, FERRITIN, TIBC, IRON, RETICCTPCT in the last 72 hours.    Radiology Studies: I have reviewed all of the imaging during this hospital visit personally     Scheduled Meds: . amLODipine  10 mg Oral Daily  . atorvastatin  40 mg Oral Daily  . Chlorhexidine Gluconate Cloth  6 each Topical Daily  . diclofenac Sodium  2 g Topical QID  . ferrous sulfate  325 mg Oral BID WC  . influenza vaccine adjuvanted  0.5 mL Intramuscular Tomorrow-1000  . insulin aspart  0-15 Units Subcutaneous TID WC  . insulin aspart  0-5 Units Subcutaneous QHS  . labetalol  200 mg Oral TID  . megestrol  80 mg Oral BID  . pneumococcal 23 valent vaccine  0.5 mL Intramuscular Tomorrow-1000  . polyethylene glycol  17 g Oral BID  . sodium chloride flush  10-40 mL Intracatheter Q12H   Continuous Infusions: .  ceFAZolin (ANCEF) IV 2 g (09/23/19 2143)     LOS: 10 days        Junior Huezo Gerome Apley, MD

## 2019-09-24 NOTE — Progress Notes (Signed)
Occupational Therapy Treatment Patient Details Name: Vanessa [name] MRN: [id number] DOB: 1956-03-10 Today's Date: 09/24/2019    History of present illness Pt is a 64 y/o female with PMH of HTN, type 2 DM, asthma presenting to ED with vaginal bleeding, progressive SOB and weakness over the last 3 weeks. Admitted with severe symptomatic anemia. Found with thrombocytopenia. CT negative, Found with L proximal humeral fracture healing (not acute) per xray. 3/12 UE venous study ordered to r/o DVT   OT comments  Pt making progress with functional goals. Sling not fitting properly upon arrival; pt in recliner. Session focused on functional mobility with SPC, toilet transfers at [facility] and in bathroom, grooming at sink, UB ADLs, sling wear and positioning. Pt very pleasant and cooperative. OT will continue to follow acutely  Follow Up Recommendations  SNF;Supervision/Assistance - 24 hour    Equipment Recommendations  3 in 1 bedside commode;Other (comment)(TBD at [facility])    Recommendations for Other Services      Precautions / Restrictions Precautions Precautions: Fall Shoulder Interventions: Shoulder sling/immobilizer;For comfort Precaution Comments: L proximal humerus fx healing (not acute)- per chat text with Dr. Cathlean Sauer (09/20/2019) pt can use arm functionally and WBAT, sling for comfort Restrictions Weight Bearing Restrictions: Yes LUE Weight Bearing: Weight bearing as tolerated Other Position/Activity Restrictions: L shoulder pain with xray finding proximal humerus fx healing (not acute)- per chat text with Dr. Cathlean Sauer (09/20/2019) pt can use arm functionally and WBAT, sling for comfort       Mobility Bed Mobility                  Transfers                      Balance                                           ADL either performed or assessed with clinical judgement   ADL Overall ADL's : Needs assistance/impaired                                              Vision Baseline Vision/History: Wears glasses Wears Glasses: At all times Patient Visual Report: No change from baseline     Perception     Praxis      Cognition Arousal/Alertness: Awake/alert Behavior During Therapy: WFL for tasks assessed/performed Overall Cognitive Status: Within Functional Limits for tasks assessed Area of Impairment: Awareness;Safety/judgement                         Safety/Judgement: Decreased awareness of safety;Decreased awareness of deficits   Problem Solving: Requires verbal cues          Exercises     Shoulder Instructions       General Comments      Pertinent Vitals/ Pain       Pain Assessment: Faces Faces Pain Scale: Hurts a little bit Pain Location: L UE Pain Descriptors / Indicators: Discomfort;Guarding;Sore Pain Intervention(s): Monitored during session;Repositioned  Home Living  Prior Functioning/Environment              Frequency  Min 2X/week        Progress Toward Goals  OT Goals(current goals can now be found in the care plan section)  Progress towards OT goals: Progressing toward goals     Plan Discharge plan remains appropriate    Co-evaluation                 AM-PAC OT "6 Clicks" Daily Activity     Outcome Measure   Help from another person eating meals?: A Little(set up) Help from another person taking care of personal grooming?: A Little Help from another person toileting, which includes using toliet, bedpan, or urinal?: A Lot Help from another person bathing (including washing, rinsing, drying)?: A Lot Help from another person to put on and taking off regular upper body clothing?: A Little Help from another person to put on and taking off regular lower body clothing?: Total 6 Click Score: 14    End of Session Equipment Utilized During Treatment: Gait belt;Other (comment)(SPC)  OT Visit Diagnosis:  Other abnormalities of gait and mobility (R26.89);Muscle weakness (generalized) (M62.81);Pain;History of falling (Z91.81);Other symptoms and signs involving cognitive function Pain - Right/Left: Left Pain - part of body: Shoulder   Activity Tolerance Patient tolerated treatment well   Patient Left in chair;with call bell/phone within reach;with chair alarm set   Nurse Communication          Time: 5883-2549 OT Time Calculation (min): 26 min  Charges: OT General Charges $OT Visit: 1 Visit OT Treatments $Self Care/Home Management : 8-22 mins $Therapeutic Activity: 8-22 mins     Britt Bottom 09/24/2019, 2:34 PM

## 2019-09-25 DIAGNOSIS — L8995 Pressure ulcer of unspecified site, unstageable: Secondary | ICD-10-CM

## 2019-09-25 LAB — CBC WITH DIFFERENTIAL/PLATELET
Abs Immature Granulocytes: 0.27 10*3/uL — ABNORMAL HIGH (ref 0.00–0.07)
Basophils Absolute: 0.1 10*3/uL (ref 0.0–0.1)
Basophils Relative: 1 %
Eosinophils Absolute: 0.2 10*3/uL (ref 0.0–0.5)
Eosinophils Relative: 1 %
HCT: 24 % — ABNORMAL LOW (ref 36.0–46.0)
Hemoglobin: 7 g/dL — ABNORMAL LOW (ref 12.0–15.0)
Immature Granulocytes: 2 %
Lymphocytes Relative: 9 %
Lymphs Abs: 1.4 10*3/uL (ref 0.7–4.0)
MCH: 25.6 pg — ABNORMAL LOW (ref 26.0–34.0)
MCHC: 29.2 g/dL — ABNORMAL LOW (ref 30.0–36.0)
MCV: 87.9 fL (ref 80.0–100.0)
Monocytes Absolute: 0.9 10*3/uL (ref 0.1–1.0)
Monocytes Relative: 6 %
Neutro Abs: 12.5 10*3/uL — ABNORMAL HIGH (ref 1.7–7.7)
Neutrophils Relative %: 81 %
Platelets: 591 10*3/uL — ABNORMAL HIGH (ref 150–400)
RBC: 2.73 MIL/uL — ABNORMAL LOW (ref 3.87–5.11)
RDW: 22.7 % — ABNORMAL HIGH (ref 11.5–15.5)
WBC: 15.3 10*3/uL — ABNORMAL HIGH (ref 4.0–10.5)
nRBC: 0 % (ref 0.0–0.2)

## 2019-09-25 LAB — BASIC METABOLIC PANEL
Anion gap: 12 (ref 5–15)
BUN: 24 mg/dL — ABNORMAL HIGH (ref 8–23)
CO2: 20 mmol/L — ABNORMAL LOW (ref 22–32)
Calcium: 8.3 mg/dL — ABNORMAL LOW (ref 8.9–10.3)
Chloride: 108 mmol/L (ref 98–111)
Creatinine, Ser: 1.68 mg/dL — ABNORMAL HIGH (ref 0.44–1.00)
GFR calc Af Amer: 37 mL/min — ABNORMAL LOW (ref >60)
GFR calc non Af Amer: 32 mL/min — ABNORMAL LOW (ref >60)
Glucose, Bld: 85 mg/dL (ref 70–99)
Potassium: 4 mmol/L (ref 3.5–5.1)
Sodium: 140 mmol/L (ref 135–145)

## 2019-09-25 LAB — PREPARE RBC (CROSSMATCH)

## 2019-09-25 MED ORDER — MEGESTROL ACETATE 40 MG PO TABS: 80.0000 mg | ORAL_TABLET | Freq: Two times a day (BID) | ORAL | 0 refills | Status: AC

## 2019-09-25 MED ORDER — OXYCODONE HCL 5 MG PO TABS: 5.0000 mg | ORAL_TABLET | Freq: Four times a day (QID) | ORAL | 0 refills | Status: DC | PRN

## 2019-09-25 MED ORDER — POLYETHYLENE GLYCOL 3350 17 G PO PACK: 17.0000 g | PACK | Freq: Every day | ORAL | 0 refills | Status: DC

## 2019-09-25 MED ORDER — LABETALOL HCL 200 MG PO TABS: 200.0000 mg | ORAL_TABLET | Freq: Three times a day (TID) | ORAL | 0 refills | Status: DC

## 2019-09-25 MED ORDER — DICLOFENAC SODIUM 1 % EX GEL: 2.0000 g | Freq: Four times a day (QID) | CUTANEOUS | 0 refills | Status: DC

## 2019-09-25 MED ORDER — BENZOCAINE 10 % MT GEL: 1.0000 "application " | Freq: Four times a day (QID) | OROMUCOSAL | 0 refills | Status: DC | PRN

## 2019-09-25 MED ORDER — SODIUM CHLORIDE 0.9% IV SOLUTION: Freq: Once | INTRAVENOUS | Status: AC

## 2019-09-25 MED ORDER — FERROUS SULFATE 325 (65 FE) MG PO TABS: 325.0000 mg | ORAL_TABLET | Freq: Two times a day (BID) | ORAL | 0 refills | Status: DC

## 2019-09-25 MED ORDER — HEPARIN SOD (PORK) LOCK FLUSH 100 UNIT/ML IV SOLN
250.0000 [IU] | INTRAVENOUS | Status: AC | PRN
  Administered 2019-09-25: 250 [IU]
  Filled 2019-09-25: qty 2.5

## 2019-09-25 MED ORDER — CEFAZOLIN IV (FOR PTA / DISCHARGE USE ONLY): 2.0000 g | Freq: Two times a day (BID) | INTRAVENOUS | 0 refills | Status: DC

## 2019-09-25 NOTE — Social Work (Signed)
Clinical Social Worker facilitated patient discharge including contacting patient family and facility to confirm patient discharge plans.  Clinical information faxed to facility and family agreeable with plan.  CSW arranged ambulance transport via [place] to [facility].  RN to call [phone]  with report prior to discharge.  Clinical Social Worker will sign off for now as social work intervention is no longer needed. Please consult [place] again if new need arises.  Westley Hummer, MSW, LCSW Clinical Social Worker

## 2019-09-25 NOTE — TOC Transition Note (Signed)
Transition of Care [facility]) - CM/SW Discharge Note   Patient Details  Name: Leasa Kincannon MRN: [id number] Date of Birth: 1955-08-11  [facility] CM/SW Contact:  Alexander Mt, LCSW Phone Number: 09/25/2019, 10:52 AM   Clinical Narrative:    Plan for d/c today.  Pt auth received.  Auth # [id number] Ref ID #[id number] Approved for 3 days 3/17-3/19  Updates to be sent to [place] at [phone]  CSW has contacted Freda Munro in admissions, will prepare pt discharge packet.    Final next level of care: Skilled Nursing Facility Barriers to Discharge: Barriers Resolved   Patient Goals and CMS Choice Patient states their goals for this hospitalization and ongoing recovery are:: to get stronger CMS Medicare.gov Compare Post Acute Care list provided to:: Patient Choice offered to / list presented to : Patient  Discharge Placement PASRR number recieved: 09/19/19            Patient chooses bed at: [facility] Patient to be transferred to facility by: [place] Name of family member notified: pt husband Patient and family notified of of transfer: 09/25/19  Discharge Plan and Services Discharge Planning Services: CM Consult Post Acute Care Choice: [place]         DME Arranged: N/A DME Agency: NA HH Arranged: NA   Readmission Risk Interventions No flowsheet data found.

## 2019-09-25 NOTE — Discharge Summary (Addendum)
Physician Discharge Summary  Kelaiah Escalona WLS:[id number] DOB: 1956/06/04 DOA: 09/14/2019  PCP: Maren Reamer, MD (Inactive)  Admit date: 09/14/2019 Discharge date: 09/25/2019  Admitted From: Home  Disposition:  SNF   Recommendations for Outpatient Follow-up and new medication changes:  1. Follow up with GYN Oncology as scheduled 2. Follow up with Dental Surgery on March 25 at 11:00, Dr. Conley Simmonds.  3. Continue antibiotic therapy with Cefazolin until 10/31/19. 4. Please remove picc line after completing full course of antibiotic therapy on 10/31/19.  5. Follow cell count in 7 days.   Home Health: na   Equipment/Devices: na    Discharge Condition: stable  CODE STATUS: full  Diet recommendation: heart healthy and diabetic prudent.                                          Dysphagia 3 diet.   Brief/Interim Summary: Patient was admitted to the hospital with a working diagnosis of symptomatic anemia in the setting of transvaginal bleedingnewly diagnosed endometrial cancer.Hospitalization complicated with MSSA bacteremia  64 year old female who presented with vaginal bleeding. She does have significant past medical history for hypertension, type 2 diabetes mellitus and asthma. Patient reportedvaginal bleeding for several months. Positive dyspnea over the last 3 weeks prior to hospitalization. Physical examination her blood pressure was 179/79, heart rate 79, respiratory rate 19, temperature 97.7, oxygenation 100%,her lungs were clear to auscultation bilaterally, heart S1-S2 present rhythmic, soft abdomen, +2+ lower extremity edema. Sodium 140, potassium 3.1, chloride 112, bicarb 18, glucose 113, BUN 27, creatinine 1.9, white count 6.8, hemoglobin 4.8, hematocrit 17.6, platelets 115.  Patient received total of 3 units packed red blood cells,1 dose of Feraheme. Underwent vaginal ultrasound showing thickened endometrium. Patient was placed on Megace perGYN service with improved  bleeding.   Patientunderwentinpatient endometrial biopsy, pathology positive for endometrial cancer.  On 03/09 #2/2 bottles blood cultures resulted positive for MSSA, follow up work up with echocardiogram showed no vegetations, patient placed on IV cefazolin. Follow up blood cultures with no growth. Failed to performed TEE, due to technical difficulties passing probe. Descition was made to complete IV antibiotic therapy for 6 weeks, to complete on 10/31/19.   Left upper extremity US negative for DVT, but positive superficial vein thrombosis involving the left cephalic vein, no indication for anticoagulation.  1.  Severe symptomatic anemia due to acute on chronic blood loss due to vaginal bleeding, complicated by thrombocytopenia, and iron deficiency.  Newly diagnosed endometrial carcinoma.  Patient had no further vaginal bleeding.  Her discharge hemoglobin is 7.0 with hematocrit of 24.0.  Patient will follow up GYN oncology as an outpatient, patient will continue megace 80 mg twice daily and to follow-up with GYN/Oncology.    I will recommend further PRBC transfusion for hemoglobin less than 7, follow-up cell count next week.  Patient developed transient thrombocytopenia, likely reactive, at discharge platelet count is 591.  2.  MSSA bacteremia.  Patient's blood cultures grew  MSSA on March 9, patient was placed on antibiotic therapy, with cefazolin.  Follow-up blood cultures were no growth.  Patient underwent transthoracic echocardiography with no vegetations seen.  Patient unable to have transesophageal echocardiogram due to unable to pass probe.  Infection disease was consulted, with recommendations to continue cefazolin till April 22.  A PICC line has been placed  3.  Esophageal dysmotility.  Patient unable to have transesophageal echocardiography, further work-up  with esophagogram showed nonspecific esophageal motility disorder, occasionally tertiary contractions, failure to distend the  distal esophagus shortly before the gastroesophageal junction.  Gastroenterology was consulted, with recommendations to continue dysphagia 3 diet.    Currently patient high risk for dislodging tooth during endoscopy or even aspiration during the procedure.  It was recommended to continue dysphagia 3 diet and follow-up as an outpatient if symptoms persist after appropriate dental evaluation.  4.  Left proximal humerus healing fracture, superficial vein thrombosis involving the left cephalic vein.  Patient had pain, edema and decreased mobility of left upper extremity.  Further work-up revealed superficial vein thrombosis in the cephalic vein.  No DVT.  Patient received topical diclofenac and oral oxycodone for pain control with good toleration.  Continue sling support for comfort and physical therapy.  5.  Hypertension.  Continue blood pressure control with labetalol.  6.  Type 2 diabetes mellitus with dyslipidemia.  Patient's glucose remained well controlled, initially on insulin therapy sliding scale.  Continue atorvastatin.  7.  AKI on Chronic kidney disease stage IIIb with nongap metabolic acidosis/ hypokalemia.  Patient had a close follow-up of her kidney function and electrolytes, her discharge creatinine is 1.68, bicarb 24, sodium 140, potassium 4.0 and bicarbonate 20.  8.  Dental pain, poor dentition.  Patient complaining of dental pain, Panoramic films show very poor dentition but no frank abscess.  She will follow up as an outpatient on March 25.  9.  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  Calculated BMI 32, continue local wound care to her lower extremities.  10.  Dysphagia with esophageal dysmotility.  Patient will continue dysphagia 3 diet, if symptoms persist follow-up with GI after complete dental evaluation.   I spoke over the phone with the patient's husband about patient's  condition, plan of care, prognosis and all questions were addressed.  Discharge Diagnoses:   Principal Problem:   MSSA bacteremia Active Problems:   DM type 2 (diabetes mellitus, type 2) (HCC)   HTN (hypertension)   Postmenopausal bleeding   Thrombocytopenia (HCC)   Acute on chronic renal insufficiency   Hypokalemia   Endometrial adenocarcinoma (HCC)   Left humeral fracture   Iron deficiency anemia due to chronic blood loss   Pressure injury of skin    Discharge Instructions   Allergies as of 09/25/2019   No Known Allergies     Medication List    STOP taking these medications   aspirin 81 MG tablet   aspirin EC 325 MG tablet   fluticasone 50 MCG/ACT nasal spray Commonly known as: FLONASE   hydrochlorothiazide 25 MG tablet Commonly known as: HYDRODIURIL   insulin NPH-regular Human (70-30) 100 UNIT/ML injection Commonly known as: NovoLIN 70/30   lisinopril 40 MG tablet Commonly known as: ZESTRIL   loratadine 10 MG tablet Commonly known as: CLARITIN   metFORMIN 500 MG 24 hr tablet Commonly known as: Glucophage XR     TAKE these medications   acetaminophen 500 MG tablet Commonly known as: TYLENOL Take 500 mg by mouth every 6 (six) hours as needed for moderate pain.   Bayer Contour Monitor w/Device Kit Use as directed for 3 times daily testing of blood glucose. E11.9   benzocaine 10 % mucosal gel Commonly known as: ORAJEL Use as directed 1 application in the mouth or throat 4 (four) times daily as needed for mouth pain.   ceFAZolin  IVPB Commonly known as: ANCEF Inject 2 g into the vein every 12 (twelve) hours. Indication:  MSSA  Bacteremia Last Day of Therapy:  10/31/2019 Labs - Once weekly:  CBC/D and BMP, Labs - Every other week:  ESR and CRP   diclofenac Sodium 1 % Gel Commonly known as: VOLTAREN Apply 2 g topically 4 (four) times daily. Apply to left shoulder.   ferrous sulfate 325 (65 FE) MG tablet Take 1 tablet (325 mg total) by mouth 2 (two) times daily with a meal.   glucose blood test strip Commonly known as: Brewing technologist Use as instructed for 3 times daily testing of blood glucose. E11.9   INSULIN SYRINGE 1CC/29G 29G X 1/2" 1 ML Misc Insulin syringes   labetalol 200 MG tablet Commonly known as: NORMODYNE Take 1 tablet (200 mg total) by mouth 3 (three) times daily.   megestrol 40 MG tablet Commonly known as: MEGACE Take 2 tablets (80 mg total) by mouth 2 (two) times daily.   onetouch ultrasoft lancets Use as instructed   oxyCODONE 5 MG immediate release tablet Commonly known as: Oxy IR/ROXICODONE Take 1 tablet (5 mg total) by mouth every 6 (six) hours as needed for moderate pain.   polyethylene glycol 17 g packet Commonly known as: MIRALAX / GLYCOLAX Take 17 g by mouth daily.            Home Infusion Instuctions  (From admission, onward)         Start     Ordered   09/25/19 0000  Home infusion instructions    Question:  Instructions  Answer:  Flushing of vascular access device: 0.9% NaCl pre/post medication administration and prn patency; Heparin 100 u/ml, 73m for implanted ports and Heparin 10u/ml, 534mfor all other central venous catheters.   09/25/19 [id number]        Contact information for follow-up providers    [facility]   Specialty: Obstetrics and Gynecology Why: They will call you with an appointment. Contact information: [address], [address] [phone]     RoEveritt AmberMD Follow up on 09/24/2019.   Specialty: Gynecologic Oncology Why: [facility] Dr. EmEveritt AmberAppointment time is at 9:30 and we ask that you arrive at 9 am to get checked in. Contact information: [address] [phone]      ShGardner CandleDMD Follow up on 10/03/2019.   Specialty: Dentistry Why: 11:00 Contact information: [address] [place]  [phone]          Contact information for after-discharge care    [place] [facility] .   Service: Skilled Nursing Contact information: [address]               No Known Allergies  Consultations:  GYN  Oncology  GI  Cardiology   ID    Procedures/Studies: CT ABDOMEN PELVIS WO CONTRAST  Result Date: 09/15/2019 CLINICAL DATA:  Nausea and vomiting.  Concern for pelvic malignancy. EXAM: CT ABDOMEN AND PELVIS WITHOUT CONTRAST TECHNIQUE: Multidetector CT imaging of the abdomen and pelvis was performed following the standard protocol without IV contrast. COMPARISON:  September 14, 2019 FINDINGS: Lower chest: There is a small right-sided pleural effusion.The heart size is mildly enlarged. The intracardiac blood pool is hypodense relative to the adjacent  myocardium consistent with anemia. Hepatobiliary: The liver is normal. Normal gallbladder.There is no biliary ductal dilation. Pancreas: Normal contours without ductal dilatation. No peripancreatic fluid collection. Spleen: No splenic laceration or hematoma. Adrenals/Urinary Tract: --Adrenal glands: No adrenal hemorrhage. --Right kidney/ureter: No hydronephrosis or perinephric hematoma. --Left kidney/ureter: The left kidney is atrophic. This may be secondary to a distal left ureteral stone measuring approximately 7 mm (axial series 3, image 58), however the ureter at this level is difficult to follow. --Urinary bladder: Unremarkable. Stomach/Bowel: --Stomach/Duodenum: No hiatal hernia or other gastric abnormality. Normal duodenal course and caliber. --Small bowel: No dilatation or inflammation. --Colon: No focal abnormality. --Appendix: Normal. Vascular/Lymphatic: Normal course and caliber of the major abdominal vessels. --No retroperitoneal lymphadenopathy. --No mesenteric lymphadenopathy. --No pelvic or inguinal lymphadenopathy. Reproductive: The uterus  is not well evaluated in the absence of IV contrast. Other: There is a trace volume of ascites. The abdominal wall is normal. Musculoskeletal. No acute displaced fractures. IMPRESSION: 1. Suboptimal evaluation of the uterus in the absence of IV contrast. See separate recent ultrasound for further evaluation and recommendations. 2. Anemia. 3. Small right-sided pleural effusion. 4. Trace abdominal ascites. 5. Atrophic left kidney which may be secondary to a distal left ureteral stone measuring approximately 7 mm. The left kidney is likely nonfunctional as there is no left-sided hydronephrosis. Electronically Signed   By: Constance Holster M.D.   On: 09/15/2019 02:16   DG Orthopantogram  Result Date: 09/22/2019 CLINICAL DATA:  Tooth pain. EXAM: ORTHOPANTOGRAM/PANORAMIC COMPARISON:  None. FINDINGS: Multiple missing teeth. Periapical lucency adjacent to the root of the remaining left mandibular lateral incisor. Periapical lucency adjacent to the left maxillary incisor and left maxillary second molar. Significant resorption of the left maxillary first molar and a portion of a residual last right maxillary molar. No bone lesions. IMPRESSION: 1. Significant dental disease with areas of periapical lucency as detailed. Electronically Signed   By: Lajean Manes M.D.   On: 09/22/2019 10:52   DG Forearm Left  Result Date: 09/17/2019 CLINICAL DATA:  Recent fall with arm pain, initial encounter EXAM: LEFT FOREARM - 2 VIEW COMPARISON:  None. FINDINGS: There is no evidence of fracture or other focal bone lesions. Soft tissues are unremarkable. IMPRESSION: No acute abnormality noted.  Mild soft tissue swelling is seen. Electronically Signed   By: Inez Catalina M.D.   On: 09/17/2019 16:27   CT HEAD WO CONTRAST  Result Date: 09/17/2019 CLINICAL DATA:  Encephalopathy EXAM: CT HEAD WITHOUT CONTRAST TECHNIQUE: Contiguous axial images were obtained from the base of the skull through the vertex without intravenous contrast.  COMPARISON:  None. FINDINGS: Brain: Mild atrophy. Hypodensity right frontal corona radiata consistent with chronic ischemia. Patchy white matter hypodensity bilaterally appears chronic. Negative for acute infarct, hemorrhage, mass. Vascular: Negative for hyperdense vessel Skull: Negative Sinuses/Orbits: Mild mucosal edema paranasal sinuses. Negative orbit. Soft tissue swelling of the left eyelid consistent with a dermal lesion. Other: None IMPRESSION: No acute abnormality. Mild atrophy and mild chronic microvascular ischemic change. Electronically Signed   By: Franchot Gallo M.D.   On: 09/17/2019 17:21   US PELVIS (TRANSABDOMINAL ONLY)  Result Date: 09/14/2019 CLINICAL DATA:  Postmenopausal vaginal bleeding. EXAM: TRANSABDOMINAL ULTRASOUND OF PELVIS TECHNIQUE: Transabdominal ultrasound examination of the pelvis was performed including evaluation of the uterus, ovaries, adnexal regions, and pelvic cul-de-sac. Attempt was made at transvaginal exam, however patient did not tolerate transvaginal probe. COMPARISON:  None. FINDINGS: Uterus Measurements: 10.0 x 5.0 x 6.4 cm = volume: 164 mL. No fibroids  or other mass visualized. Endometrium Poorly defined. Endometrium appears mildly thickened, tentatively measured at 8 mm. Right ovary Not visualized.  No adnexal mass. Left ovary Not visualized.  No adnexal mass. Other findings:  No abnormal free fluid. IMPRESSION: Endometrium is poorly defined, however appears thickened measuring approximately 8-9 mm. In the setting of post-menopausal bleeding, endometrial sampling is indicated to exclude carcinoma. If results are benign, sonohysterogram should be considered for focal lesion work-up. (Ref: Radiological Reasoning: Algorithmic Workup of Abnormal Vaginal Bleeding with Endovaginal Sonography and Sonohysterography. AJR 2008; 295:A21-30) Electronically Signed   By: Keith Rake M.D.   On: 09/14/2019 23:23   DG Chest Port 1 View  Result Date: 09/17/2019 CLINICAL DATA:   Weakness and leukocytosis. EXAM: PORTABLE CHEST 1 VIEW COMPARISON:  Chest x-ray 09/14/2019 FINDINGS: The heart is mildly enlarged but stable. Stable mild tortuosity of the thoracic aorta. Low lung volumes with vascular crowding and streaky basilar atelectasis. No infiltrates or effusions. No pulmonary lesions. The bony thorax is intact. Stable healing left humeral neck fracture. IMPRESSION: Low lung volumes with vascular crowding and streaky basilar atelectasis. Electronically Signed   By: Marijo Sanes M.D.   On: 09/17/2019 09:08   DG Chest Port 1 View  Result Date: 09/14/2019 CLINICAL DATA:  Shortness of breath. EXAM: PORTABLE CHEST 1 VIEW COMPARISON:  03/06/2014 FINDINGS: Enlarged cardiac silhouette with an interval significant increase in size. Clear lungs. Interval prominence of the pulmonary vasculature in the upper lung zones. No pleural fluid. Thoracic spine degenerative changes. Interval probable old, healed left humeral neck fracture. Mild right shoulder degenerative changes. IMPRESSION: Interval cardiomegaly and pulmonary vascular congestion in the upper lung zones. Electronically Signed   By: Claudie Revering M.D.   On: 09/14/2019 16:58   DG Shoulder Left Port  Result Date: 09/17/2019 CLINICAL DATA:  Shoulder pain EXAM: LEFT SHOULDER COMPARISON:  09/14/2019 FINDINGS: Proximal left humeral fracture is noted with involvement primarily the surgical neck. Callus formation is noted consistent with healing. No acute fracture is seen. No other focal abnormality is noted. IMPRESSION: Healing proximal left humeral fracture Electronically Signed   By: Inez Catalina M.D.   On: 09/17/2019 16:31   ECHOCARDIOGRAM COMPLETE  Result Date: 09/18/2019    ECHOCARDIOGRAM REPORT   Patient Name:   VILMA WILL Lute Date of Exam: 09/18/2019 Medical Rec #:  [id number]       Height:       60.0 in Accession #:    [id number]      Weight:       166.0 lb Date of Birth:  1955/10/02       BSA:          1.725 m Patient Age:    64 years         BP:           162/80 mmHg Patient Gender: F               HR:           80 bpm. Exam Location:  Inpatient Procedure: 2D Echo, Cardiac Doppler and Color Doppler Indications:    Bacteremia 790.7 / R78.81  History:        Patient has no prior history of Echocardiogram examinations.                 Signs/Symptoms:Murmur; Risk Factors:Hypertension and Diabetes.  Sonographer:    Tiffany Dance Referring Phys: [id number] [place]  1. Left ventricular ejection fraction, by estimation, is 55 to  60%. The left ventricle has normal function. The left ventricle has no regional wall motion abnormalities. Left ventricular diastolic parameters are consistent with Grade II diastolic dysfunction (pseudonormalization). Elevated left ventricular end-diastolic pressure.  2. Right ventricular systolic function is normal. The right ventricular size is normal. There is moderately elevated pulmonary artery systolic pressure. The estimated right ventricular systolic pressure is 97.6 mmHg.  3. Left atrial size was moderately dilated.  4. The mitral valve is abnormal. Mild to moderate mitral valve regurgitation.  5. The aortic valve is tricuspid. Aortic valve regurgitation is trivial. Mild aortic valve sclerosis is present, with no evidence of aortic valve stenosis.  6. The inferior vena cava is dilated in size with <50% respiratory variability, suggesting right atrial pressure of 15 mmHg. Conclusion(s)/Recommendation(s): No evidence of valvular vegetations on this transthoracic echocardiogram. Would recommend a transesophageal echocardiogram to exclude infective endocarditis if clinically indicated. FINDINGS  Left Ventricle: Left ventricular ejection fraction, by estimation, is 55 to 60%. The left ventricle has normal function. The left ventricle has no regional wall motion abnormalities. The left ventricular internal cavity size was normal in size. There is  no left ventricular hypertrophy. Left ventricular diastolic  parameters are consistent with Grade II diastolic dysfunction (pseudonormalization). Elevated left ventricular end-diastolic pressure. Right Ventricle: The right ventricular size is normal. No increase in right ventricular wall thickness. Right ventricular systolic function is normal. There is moderately elevated pulmonary artery systolic pressure. The tricuspid regurgitant velocity is 3.26 m/s, and with an assumed right atrial pressure of 15 mmHg, the estimated right ventricular systolic pressure is 73.4 mmHg. Left Atrium: Left atrial size was moderately dilated. Right Atrium: Right atrial size was normal in size. Pericardium: There is no evidence of pericardial effusion. Mitral Valve: The mitral valve is abnormal. There is mild thickening of the mitral valve leaflet(s). Mild to moderate mitral valve regurgitation. Tricuspid Valve: The tricuspid valve is grossly normal. Tricuspid valve regurgitation is mild. Aortic Valve: The aortic valve is tricuspid. Aortic valve regurgitation is trivial. Mild aortic valve sclerosis is present, with no evidence of aortic valve stenosis. Pulmonic Valve: The pulmonic valve was grossly normal. Pulmonic valve regurgitation is trivial. Aorta: The aortic root, ascending aorta, aortic arch and descending aorta are all structurally normal, with no evidence of dilitation or obstruction. Venous: The inferior vena cava is dilated in size with less than 50% respiratory variability, suggesting right atrial pressure of 15 mmHg. IAS/Shunts: No atrial level shunt detected by color flow Doppler.  LEFT VENTRICLE PLAX 2D LVIDd:         4.22 cm  Diastology LVIDs:         2.89 cm  LV e' lateral:   6.31 cm/s LV PW:         0.94 cm  LV E/e' lateral: 20.1 LV IVS:        0.90 cm  LV e' medial:    4.79 cm/s LVOT diam:     2.00 cm  LV E/e' medial:  26.5 LV SV:         77 LV SV Index:   45 LVOT Area:     3.14 cm  RIGHT VENTRICLE             IVC RV Basal diam:  2.57 cm     IVC diam: 2.24 cm RV S prime:      11.10 cm/s TAPSE (M-mode): 2.0 cm LEFT ATRIUM             Index  RIGHT ATRIUM           Index LA diam:        3.30 cm 1.91 cm/m  RA Area:     18.30 cm LA Vol (A2C):   62.4 ml 36.18 ml/m RA Volume:   52.10 ml  30.21 ml/m LA Vol (A4C):   71.5 ml 41.46 ml/m LA Biplane Vol: 72.0 ml 41.75 ml/m  AORTIC VALVE LVOT Vmax:   121.00 cm/s LVOT Vmean:  77.500 cm/s LVOT VTI:    0.245 m  AORTA Ao Root diam: 3.10 cm Ao Asc diam:  3.30 cm MITRAL VALVE                TRICUSPID VALVE MV Area (PHT): 4.06 cm     TR Peak grad:   42.5 mmHg MV Decel Time: 187 msec     TR Vmax:        326.00 cm/s MV E velocity: 127.00 cm/s MV A velocity: 85.90 cm/s   SHUNTS MV E/A ratio:  1.48         Systemic VTI:  0.24 m                             Systemic Diam: 2.00 cm Lyman Bishop MD Electronically signed by Lyman Bishop MD Signature Date/Time: 09/18/2019/6:01:27 PM    Final    VAS [place] LOWER EXTREMITY VENOUS (DVT)  Result Date: 09/16/2019  Lower Venous DVTStudy Indications: Swelling, and Pain.  Comparison Study: no prior Performing Technologist: Abram Sander RVS  Examination Guidelines: A complete evaluation includes B-mode imaging, spectral Doppler, color Doppler, and power Doppler as needed of all accessible portions of each vessel. Bilateral testing is considered an integral part of a complete examination. Limited examinations for reoccurring indications may be performed as noted. The reflux portion of the exam is performed with the patient in reverse Trendelenburg.  +---------+---------------+---------+-----------+----------+--------------+ RIGHT    CompressibilityPhasicitySpontaneityPropertiesThrombus Aging +---------+---------------+---------+-----------+----------+--------------+ CFV      Full           Yes      Yes                                 +---------+---------------+---------+-----------+----------+--------------+ SFJ      Full                                                         +---------+---------------+---------+-----------+----------+--------------+ FV Prox  Full                                                        +---------+---------------+---------+-----------+----------+--------------+ FV Mid   Full                                                        +---------+---------------+---------+-----------+----------+--------------+ FV DistalFull                                                        +---------+---------------+---------+-----------+----------+--------------+  PFV      Full                                                        +---------+---------------+---------+-----------+----------+--------------+ POP      Full           Yes      Yes                                 +---------+---------------+---------+-----------+----------+--------------+ PTV      Full                                                        +---------+---------------+---------+-----------+----------+--------------+ PERO                                                  Not visualized +---------+---------------+---------+-----------+----------+--------------+   +---------+---------------+---------+-----------+----------+--------------+ LEFT     CompressibilityPhasicitySpontaneityPropertiesThrombus Aging +---------+---------------+---------+-----------+----------+--------------+ CFV      Full           Yes      Yes                                 +---------+---------------+---------+-----------+----------+--------------+ SFJ      Full                                                        +---------+---------------+---------+-----------+----------+--------------+ FV Prox  Full                                                        +---------+---------------+---------+-----------+----------+--------------+ FV Mid   Full                                                         +---------+---------------+---------+-----------+----------+--------------+ FV DistalFull                                                        +---------+---------------+---------+-----------+----------+--------------+ PFV      Full                                                        +---------+---------------+---------+-----------+----------+--------------+  POP      Full           Yes      Yes                                 +---------+---------------+---------+-----------+----------+--------------+ PTV                                                   Not visualized +---------+---------------+---------+-----------+----------+--------------+ PERO                                                  Not visualized +---------+---------------+---------+-----------+----------+--------------+     Summary: RIGHT: - There is no evidence of deep vein thrombosis in the lower extremity.  - No cystic structure found in the popliteal fossa.  LEFT: - There is no evidence of deep vein thrombosis in the lower extremity. However, portions of this examination were limited- see technologist comments above.  - No cystic structure found in the popliteal fossa.  *See table(s) above for measurements and observations. Electronically signed by Ruta Hinds MD on 09/16/2019 at 3:22:59 PM.    Final    VAS [place] UPPER EXTREMITY VENOUS DUPLEX  Result Date: 09/21/2019 UPPER VENOUS STUDY  Indications: Edema Limitations: Patient positioning. Comparison Study: no prior Performing Technologist: Abram Sander RVS  Examination Guidelines: A complete evaluation includes B-mode imaging, spectral Doppler, color Doppler, and power Doppler as needed of all accessible portions of each vessel. Bilateral testing is considered an integral part of a complete examination. Limited examinations for reoccurring indications may be performed as noted.  Left Findings:  +----------+------------+---------+-----------+----------+-----------------+ LEFT      CompressiblePhasicitySpontaneousProperties     Summary      +----------+------------+---------+-----------+----------+-----------------+ IJV           Full       Yes       Yes                                +----------+------------+---------+-----------+----------+-----------------+ Subclavian    Full       Yes       Yes                                +----------+------------+---------+-----------+----------+-----------------+ Axillary      Full       Yes       Yes                                +----------+------------+---------+-----------+----------+-----------------+ Brachial      Full       Yes       Yes                                +----------+------------+---------+-----------+----------+-----------------+ Radial        Full                                                    +----------+------------+---------+-----------+----------+-----------------+  Ulnar         Full                                                    +----------+------------+---------+-----------+----------+-----------------+ Cephalic      None                                  Age Indeterminate +----------+------------+---------+-----------+----------+-----------------+ Basilic       Full                                                    +----------+------------+---------+-----------+----------+-----------------+  Summary:  Left: No evidence of deep vein thrombosis in the upper extremity. Findings consistent with age indeterminate superficial vein thrombosis involving the left cephalic vein.  *See table(s) above for measurements and observations.  Diagnosing physician: Harold Barban MD Electronically signed by Harold Barban MD on 09/21/2019 at 7:07:45 PM.    Final    [place] EKG SITE RITE  Result Date: 09/22/2019 If Site Rite image not attached, placement could not be confirmed due to current  cardiac rhythm.  DG ESOPHAGUS W SINGLE CM (SOL OR THIN BA)  Result Date: 09/24/2019 CLINICAL DATA:  64 year old female with history of unsuccessful TEE yesterday. Nausea and vomiting intermittently. EXAM: ESOPHOGRAM/BARIUM SWALLOW TECHNIQUE: Single contrast examination was performed using thin barium or water soluble. FLUOROSCOPY TIME:  Fluoroscopy Time:  48 seconds Radiation Exposure Index (if provided by the fluoroscopic device): 3.8 mGy Number of Acquired Spot Images: 0 COMPARISON:  None. FINDINGS: Limited single contrast study was performed due to lack of patient mobility. Within the limitations of today's examination, there was no evidence of esophageal mass, esophageal ring or hiatal hernia. The distal esophagus shortly before the gastroesophageal junction never fully distended during the examination. Failure to normally propagate primary peristaltic waves was noted, with occasional tertiary contractions. A barium tablet was not administered as the patient cannot tolerate swallowing pills. IMPRESSION: 1. 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 2. Failure to fully distend the distal esophagus shortly before the gastroesophageal junction. The possibility of a distal esophageal stricture is not entirely excluded. Unfortunately, the patient could not tolerate ingestion of a barium tablet to better evaluate this finding. If there is clinical concern for distal esophageal stricture, further evaluation with nonemergent endoscopy should be considered. Electronically Signed   By: Vinnie Langton M.D.   On: 09/24/2019 09:24      Procedures: endometrial biopsy   Subjective: Patient is feeling better, no dyspnea, no chest pain and tolerating po well. No significant bleeding.   Discharge Exam: Vitals:   09/24/19 2041 09/25/19 0433  BP: (!) 149/74 (!) 163/76  Pulse: 67 71  Resp: 17 17  Temp: 97.7 F (36.5 C) 98.6 F (37 C)  SpO2: 100% 100%   Vitals:    09/24/19 1252 09/24/19 1519 09/24/19 2041 09/25/19 0433  BP: (!) 142/71 (!) 134/114 (!) 149/74 (!) 163/76  Pulse: 71 77 67 71  Resp: 18  17 17   Temp: 98.7 F (37.1 C)  97.7 F (36.5 C) 98.6 F (37 C)  TempSrc: Oral  Oral Oral  SpO2: 100%  100% 100%  Weight:      Height:        General: Not in pain or dyspnea.  Neurology: Awake and alert, non focal  E ENT: mild pallor, no icterus, oral mucosa moist Cardiovascular: No JVD. S1-S2 present, rhythmic, no gallops, rubs, or murmurs. Positive bilateral lower extremity edema/ dressing in place. Pulmonary: vesicular breath sounds bilaterally, adequate air movement, no wheezing, rhonchi or rales. Gastrointestinal. Abdomen with no organomegaly, non tender, no rebound or guarding Skin. No rashes Musculoskeletal: no joint deformities   The results of significant diagnostics from this hospitalization (including imaging, microbiology, ancillary and laboratory) are listed below for reference.     Microbiology: Recent Results (from the past 240 hour(s))  Culture, blood (routine x 2)     Status: Abnormal   Collection Time: 09/17/19  8:40 AM   Specimen: BLOOD  Result Value Ref Range Status   Specimen Description BLOOD LEFT ANTECUBITAL  Final   Special Requests   Final    BOTTLES DRAWN AEROBIC ONLY Blood Culture results may not be optimal due to an inadequate volume of blood received in culture bottles   Culture  Setup Time   Final    AEROBIC BOTTLE ONLY GRAM POSITIVE COCCI IN CLUSTERS CRITICAL RESULT CALLED TO, READ BACK BY AND VERIFIED WITH: Shellee Milo [facility] 09/17/19 2358 JDW Performed at [facility], [address] [place], [place] [address]    Culture STAPHYLOCOCCUS AUREUS (A)  Final   Report Status 09/19/2019 FINAL  Final   Organism ID, Bacteria STAPHYLOCOCCUS AUREUS  Final      Susceptibility   Staphylococcus aureus - MIC*    CIPROFLOXACIN <=0.5 SENSITIVE Sensitive     ERYTHROMYCIN <=0.25 SENSITIVE Sensitive     GENTAMICIN <=0.5  SENSITIVE Sensitive     OXACILLIN <=0.25 SENSITIVE Sensitive     TETRACYCLINE <=1 SENSITIVE Sensitive     VANCOMYCIN 1 SENSITIVE Sensitive     TRIMETH/SULFA <=10 SENSITIVE Sensitive     CLINDAMYCIN <=0.25 SENSITIVE Sensitive     RIFAMPIN <=0.5 SENSITIVE Sensitive     Inducible Clindamycin NEGATIVE Sensitive     * STAPHYLOCOCCUS AUREUS  Blood Culture ID Panel (Reflexed)     Status: Abnormal   Collection Time: 09/17/19  8:40 AM  Result Value Ref Range Status   Enterococcus species NOT DETECTED NOT DETECTED Final   Listeria monocytogenes NOT DETECTED NOT DETECTED Final   Staphylococcus species DETECTED (A) NOT DETECTED Final    Comment: CRITICAL RESULT CALLED TO, READ BACK BY AND VERIFIED WITH: L SEAY PHARMD 09/17/19 2358 JDW    Staphylococcus aureus (BCID) DETECTED (A) NOT DETECTED Final    Comment: Methicillin (oxacillin) susceptible Staphylococcus aureus (MSSA). Preferred therapy is anti staphylococcal beta lactam antibiotic (Cefazolin or Nafcillin), unless clinically contraindicated. CRITICAL RESULT CALLED TO, READ BACK BY AND VERIFIED WITH: L SEAY PHARMD 09/17/19 2358 JDW    Methicillin resistance NOT DETECTED NOT DETECTED Final   Streptococcus species NOT DETECTED NOT DETECTED Final   Streptococcus agalactiae NOT DETECTED NOT DETECTED Final   Streptococcus pneumoniae NOT DETECTED NOT DETECTED Final   Streptococcus pyogenes NOT DETECTED NOT DETECTED Final   Acinetobacter baumannii NOT DETECTED NOT DETECTED Final   Enterobacteriaceae species NOT DETECTED NOT DETECTED Final   Enterobacter cloacae complex NOT DETECTED NOT DETECTED Final   Escherichia coli NOT DETECTED NOT DETECTED Final   Klebsiella oxytoca NOT DETECTED NOT DETECTED Final   Klebsiella pneumoniae NOT DETECTED NOT DETECTED Final   Proteus species NOT DETECTED NOT  DETECTED Final   Serratia marcescens NOT DETECTED NOT DETECTED Final   Haemophilus influenzae NOT DETECTED NOT DETECTED Final   Neisseria meningitidis NOT  DETECTED NOT DETECTED Final   Pseudomonas aeruginosa NOT DETECTED NOT DETECTED Final   Candida albicans NOT DETECTED NOT DETECTED Final   Candida glabrata NOT DETECTED NOT DETECTED Final   Candida krusei NOT DETECTED NOT DETECTED Final   Candida parapsilosis NOT DETECTED NOT DETECTED Final   Candida tropicalis NOT DETECTED NOT DETECTED Final    Comment: Performed at [facility], [place] [address], [place], [place] [address]  Culture, blood (routine x 2)     Status: Abnormal   Collection Time: 09/17/19  8:47 AM   Specimen: BLOOD  Result Value Ref Range Status   Specimen Description BLOOD RIGHT ANTECUBITAL  Final   Special Requests   Final    BOTTLES DRAWN AEROBIC ONLY Blood Culture results may not be optimal due to an inadequate volume of blood received in culture bottles   Culture  Setup Time   Final    AEROBIC BOTTLE ONLY GRAM POSITIVE COCCI IN CLUSTERS CRITICAL VALUE NOTED.  VALUE IS CONSISTENT WITH PREVIOUSLY REPORTED AND CALLED VALUE.    Culture (A)  Final    STAPHYLOCOCCUS AUREUS SUSCEPTIBILITIES PERFORMED ON PREVIOUS CULTURE WITHIN THE LAST 5 DAYS. Performed at [facility], [place] [address], [place], [place] [address]    Report Status 09/19/2019 FINAL  Final  Culture, Urine     Status: Abnormal   Collection Time: 09/18/19 12:15 PM   Specimen: Urine, Random  Result Value Ref Range Status   Specimen Description URINE, RANDOM  Final   Special Requests NONE  Final   Culture (A)  Final    <10,000 COLONIES/mL INSIGNIFICANT GROWTH Performed at [facility], [place] [address], [place], [place] [address]    Report Status 09/19/2019 FINAL  Final  Culture, blood (routine x 2)     Status: None   Collection Time: 09/19/19  2:16 AM   Specimen: BLOOD  Result Value Ref Range Status   Specimen Description BLOOD LEFT FOREARM  Final   Special Requests   Final    BOTTLES DRAWN AEROBIC AND ANAEROBIC Blood Culture adequate volume   Culture   Final    NO GROWTH 5  DAYS Performed at [facility], [place] [address], [place], [place] [address]    Report Status 09/24/2019 FINAL  Final  Culture, blood (routine x 2)     Status: None   Collection Time: 09/19/19  2:23 AM   Specimen: BLOOD  Result Value Ref Range Status   Specimen Description BLOOD LEFT FOREARM  Final   Special Requests   Final    BOTTLES DRAWN AEROBIC ONLY Blood Culture results may not be optimal due to an inadequate volume of blood received in culture bottles   Culture   Final    NO GROWTH 5 DAYS Performed at [facility], [place] [address], [place], [place] [address]    Report Status 09/24/2019 FINAL  Final  SARS CORONAVIRUS 2 (TAT 6-24 HRS) Nasopharyngeal Nasopharyngeal Swab     Status: None   Collection Time: 09/24/19  6:02 PM   Specimen: Nasopharyngeal Swab  Result Value Ref Range Status   SARS Coronavirus 2 NEGATIVE NEGATIVE Final    Comment: (NOTE) SARS-CoV-2 target nucleic acids are NOT DETECTED. The SARS-CoV-2 RNA is generally detectable in upper and lower respiratory specimens during the acute phase of infection. Negative  results do not preclude SARS-CoV-2 infection, do not rule out co-infections with other pathogens, and should not be used as the sole basis for treatment or other patient management decisions. Negative results must be combined with clinical observations, patient history, and epidemiological information. The expected result is Negative. Fact Sheet for Patients: SugarRoll.be Fact Sheet for Healthcare Providers: https://www.woods-mathews.com/ This test is not yet approved or cleared by the [place] FDA and  has been authorized for detection and/or diagnosis of SARS-CoV-2 by FDA under an Emergency Use Authorization (EUA). This EUA will remain  in effect (meaning this test can be used) for the duration of the COVID-19 declaration under Section 56 4(b)(1) of the Act, 21 U.S.C. section 360bbb-3(b)(1), unless  the authorization is terminated or revoked sooner. Performed at [facility], [place] [address] [place], [place] [address]      Labs: BNP (last 3 results) No results for input(s): BNP in the last 8760 hours. Basic Metabolic Panel: Recent Labs  Lab 09/20/19 0329 09/21/19 0242 09/22/19 0423 09/24/19 0405 09/25/19 0343  NA 143 140 140 140 140  K 4.0 3.9 3.7 4.2 4.0  CL 112* 112* 113* 109 108  CO2 17* 17* 19* 21* 20*  GLUCOSE 120* 150* 98 84 85  BUN 36* 30* 29* 24* 24*  CREATININE 1.87* 1.78* 1.68* 1.77* 1.68*  CALCIUM 8.8* 8.3* 8.2* 8.4* 8.3*   Liver Function Tests: No results for input(s): AST, ALT, ALKPHOS, BILITOT, PROT, ALBUMIN in the last 168 hours. No results for input(s): LIPASE, AMYLASE in the last 168 hours. No results for input(s): AMMONIA in the last 168 hours. CBC: Recent Labs  Lab 09/21/19 0242 09/22/19 0423 09/23/19 0326 09/24/19 0405 09/25/19 0343  WBC 14.2* 17.2* 18.2* 17.9* 15.3*  NEUTROABS 12.1* 14.5* 14.9* 15.0* 12.5*  HGB 7.9* 7.8* 8.0* 7.7* 7.0*  HCT 25.7* 26.0* 26.6* 25.8* 24.0*  MCV 84.8 86.1 85.5 87.2 87.9  PLT 194 275 424* 564* 591*   Cardiac Enzymes: No results for input(s): CKTOTAL, CKMB, CKMBINDEX, TROPONINI in the last 168 hours. BNP: Invalid input(s): POCBNP CBG: Recent Labs  Lab 09/23/19 1640 09/23/19 2134 09/24/19 0744 09/24/19 1136 09/24/19 1636  GLUCAP 99 80 88 83 140*   D-Dimer No results for input(s): DDIMER in the last 72 hours. Hgb A1c No results for input(s): HGBA1C in the last 72 hours. Lipid Profile No results for input(s): CHOL, HDL, LDLCALC, TRIG, CHOLHDL, LDLDIRECT in the last 72 hours. Thyroid function studies No results for input(s): TSH, T4TOTAL, T3FREE, THYROIDAB in the last 72 hours.  Invalid input(s): FREET3 Anemia work up No results for input(s): VITAMINB12, FOLATE, FERRITIN, TIBC, IRON, RETICCTPCT in the last 72 hours. Urinalysis    Component Value Date/Time   COLORURINE AMBER (A)  09/18/2019 1151   APPEARANCEUR CLOUDY (A) 09/18/2019 1151   LABSPEC 1.019 09/18/2019 1151   PHURINE 5.0 09/18/2019 1151   GLUCOSEU NEGATIVE 09/18/2019 1151   HGBUR LARGE (A) 09/18/2019 1151   BILIRUBINUR NEGATIVE 09/18/2019 1151   KETONESUR NEGATIVE 09/18/2019 1151   PROTEINUR 100 (A) 09/18/2019 1151   UROBILINOGEN 0.2 03/06/2014 1644   NITRITE NEGATIVE 09/18/2019 1151   LEUKOCYTESUR SMALL (A) 09/18/2019 1151   Sepsis Labs Invalid input(s): PROCALCITONIN,  WBC,  LACTICIDVEN Microbiology Recent Results (from the past 240 hour(s))  Culture, blood (routine x 2)     Status: Abnormal   Collection Time: 09/17/19  8:40 AM   Specimen: BLOOD  Result Value Ref Range Status   Specimen Description BLOOD LEFT ANTECUBITAL  Final   Special Requests   Final    BOTTLES DRAWN AEROBIC ONLY Blood Culture results may not be optimal due to an inadequate volume of blood received in culture bottles   Culture  Setup Time   Final    AEROBIC BOTTLE ONLY GRAM POSITIVE COCCI IN CLUSTERS CRITICAL RESULT CALLED TO, READ BACK BY AND VERIFIED WITH: Shellee Milo [facility] 09/17/19 2358 JDW Performed at [facility], [address], [place], [place] [address]    Culture STAPHYLOCOCCUS AUREUS (A)  Final   Report Status 09/19/2019 FINAL  Final   Organism ID, Bacteria STAPHYLOCOCCUS AUREUS  Final      Susceptibility   Staphylococcus aureus - MIC*    CIPROFLOXACIN <=0.5 SENSITIVE Sensitive     ERYTHROMYCIN <=0.25 SENSITIVE Sensitive     GENTAMICIN <=0.5 SENSITIVE Sensitive     OXACILLIN <=0.25 SENSITIVE Sensitive     TETRACYCLINE <=1 SENSITIVE Sensitive     VANCOMYCIN 1 SENSITIVE Sensitive     TRIMETH/SULFA <=10 SENSITIVE Sensitive     CLINDAMYCIN <=0.25 SENSITIVE Sensitive     RIFAMPIN <=0.5 SENSITIVE Sensitive     Inducible Clindamycin NEGATIVE Sensitive     * STAPHYLOCOCCUS AUREUS  Blood Culture ID Panel (Reflexed)     Status: Abnormal   Collection Time: 09/17/19  8:40 AM  Result Value Ref Range Status    Enterococcus species NOT DETECTED NOT DETECTED Final   Listeria monocytogenes NOT DETECTED NOT DETECTED Final   Staphylococcus species DETECTED (A) NOT DETECTED Final    Comment: CRITICAL RESULT CALLED TO, READ BACK BY AND VERIFIED WITH: L SEAY PHARMD 09/17/19 2358 JDW    Staphylococcus aureus (BCID) DETECTED (A) NOT DETECTED Final    Comment: Methicillin (oxacillin) susceptible Staphylococcus aureus (MSSA). Preferred therapy is anti staphylococcal beta lactam antibiotic (Cefazolin or Nafcillin), unless clinically contraindicated. CRITICAL RESULT CALLED TO, READ BACK BY AND VERIFIED WITH: L SEAY PHARMD 09/17/19 2358 JDW    Methicillin resistance NOT DETECTED NOT DETECTED Final   Streptococcus species NOT DETECTED NOT DETECTED Final   Streptococcus agalactiae NOT DETECTED NOT DETECTED Final   Streptococcus pneumoniae NOT DETECTED NOT DETECTED Final   Streptococcus pyogenes NOT DETECTED NOT DETECTED Final   Acinetobacter baumannii NOT DETECTED NOT DETECTED Final   Enterobacteriaceae species NOT DETECTED NOT DETECTED Final   Enterobacter cloacae complex NOT DETECTED NOT DETECTED Final   Escherichia coli NOT DETECTED NOT DETECTED Final   Klebsiella oxytoca NOT DETECTED NOT DETECTED Final   Klebsiella pneumoniae NOT DETECTED NOT DETECTED Final   Proteus species NOT DETECTED NOT DETECTED Final   Serratia marcescens NOT DETECTED NOT DETECTED Final   Haemophilus influenzae NOT DETECTED NOT DETECTED Final   Neisseria meningitidis NOT DETECTED NOT DETECTED Final   Pseudomonas aeruginosa NOT DETECTED NOT DETECTED Final   Candida albicans NOT DETECTED NOT DETECTED Final   Candida glabrata NOT DETECTED NOT DETECTED Final   Candida krusei NOT DETECTED NOT DETECTED Final   Candida parapsilosis NOT DETECTED NOT DETECTED Final   Candida tropicalis NOT DETECTED NOT DETECTED Final    Comment: Performed at [facility], [place] [address] [place], [place] [address]  Culture, blood (routine x 2)      Status: Abnormal   Collection Time: 09/17/19  8:47 AM   Specimen: BLOOD  Result Value Ref Range Status   Specimen Description BLOOD RIGHT ANTECUBITAL  Final   Special Requests   Final    BOTTLES DRAWN AEROBIC ONLY Blood Culture results may not be optimal  due to an inadequate volume of blood received in culture bottles   Culture  Setup Time   Final    AEROBIC BOTTLE ONLY GRAM POSITIVE COCCI IN CLUSTERS CRITICAL VALUE NOTED.  VALUE IS CONSISTENT WITH PREVIOUSLY REPORTED AND CALLED VALUE.    Culture (A)  Final    STAPHYLOCOCCUS AUREUS SUSCEPTIBILITIES PERFORMED ON PREVIOUS CULTURE WITHIN THE LAST 5 DAYS. Performed at [facility], [place] [address], [place], [place] [address]    Report Status 09/19/2019 FINAL  Final  Culture, Urine     Status: Abnormal   Collection Time: 09/18/19 12:15 PM   Specimen: Urine, Random  Result Value Ref Range Status   Specimen Description URINE, RANDOM  Final   Special Requests NONE  Final   Culture (A)  Final    <10,000 COLONIES/mL INSIGNIFICANT GROWTH Performed at [facility], [place] [address], [place], [place] [address]    Report Status 09/19/2019 FINAL  Final  Culture, blood (routine x 2)     Status: None   Collection Time: 09/19/19  2:16 AM   Specimen: BLOOD  Result Value Ref Range Status   Specimen Description BLOOD LEFT FOREARM  Final   Special Requests   Final    BOTTLES DRAWN AEROBIC AND ANAEROBIC Blood Culture adequate volume   Culture   Final    NO GROWTH 5 DAYS Performed at [facility], [place] [address] [place], [place] [address]    Report Status 09/24/2019 FINAL  Final  Culture, blood (routine x 2)     Status: None   Collection Time: 09/19/19  2:23 AM   Specimen: BLOOD  Result Value Ref Range Status   Specimen Description BLOOD LEFT FOREARM  Final   Special Requests   Final    BOTTLES DRAWN AEROBIC ONLY Blood Culture results may not be optimal due to an inadequate volume of blood received in culture bottles   Culture    Final    NO GROWTH 5 DAYS Performed at [facility], [place] [address], [place],  [address]    Report Status 09/24/2019 FINAL  Final  SARS CORONAVIRUS 2 (TAT 6-24 HRS) Nasopharyngeal Nasopharyngeal Swab     Status: None   Collection Time: 09/24/19  6:02 PM   Specimen: Nasopharyngeal Swab  Result Value Ref Range Status   SARS Coronavirus 2 NEGATIVE NEGATIVE Final    Comment: (NOTE) SARS-CoV-2 target nucleic acids are NOT DETECTED. The SARS-CoV-2 RNA is generally detectable in upper and lower respiratory specimens during the acute phase of infection. Negative results do not preclude SARS-CoV-2 infection, do not rule out co-infections with other pathogens, and should not be used as the sole basis for treatment or other patient management decisions. Negative results must be combined with clinical observations, patient history, and epidemiological information. The expected result is Negative. Fact Sheet for Patients: SugarRoll.be Fact Sheet for Healthcare Providers: https://www.woods-mathews.com/ This test is not yet approved or cleared by the [place] FDA and  has been authorized for detection and/or diagnosis of SARS-CoV-2 by FDA under an Emergency Use Authorization (EUA). This EUA will remain  in effect (meaning this test can be used) for the duration of the COVID-19 declaration under Section 56 4(b)(1) of the Act, 21 U.S.C. section 360bbb-3(b)(1), unless the authorization is terminated or revoked sooner. Performed at [facility], [place] [address] [place],  [address]      Time coordinating discharge: 45 minutes  SIGNED:   Tawni Millers, MD  [facility] 09/25/2019, 9:05 AM

## 2019-09-25 NOTE — Progress Notes (Signed)
Physical Therapy Treatment Patient Details Name: Vanessa [name] MRN: [id number] DOB: 08-Nov-1955 Today's Date: 09/25/2019    History of Present Illness Pt is a 64 y/o female with PMH of HTN, type 2 DM, asthma presenting to ED with vaginal bleeding, progressive SOB and weakness over the last 3 weeks. Admitted with severe symptomatic anemia. Found with thrombocytopenia. CT negative, Found with L proximal humeral fracture healing (not acute) per xray. 3/12 UE venous study ordered to r/o DVT    PT Comments    Pt was eager to get into the hall and walk.  Emphasis on transfer safety using the cane and gait training with the cane.  Pt still not safe to be alone at home and her husband is not around for long periods of time.    Follow Up Recommendations  SNF;Supervision/Assistance - 24 hour     Equipment Recommendations  Other (comment)(TBA)    Recommendations for Other Services       Precautions / Restrictions Precautions Precautions: Fall Shoulder Interventions: Shoulder sling/immobilizer;For comfort Precaution Comments: L proximal humerus fx healing (not acute)- per chat text with Dr. Cathlean Sauer (09/20/2019) pt can use arm functionally and WBAT, sling for comfort Required Braces or Orthoses: Sling Restrictions LUE Weight Bearing: Weight bearing as tolerated Other Position/Activity Restrictions: L shoulder pain with xray finding proximal humerus fx healing (not acute)- per chat text with Dr. Cathlean Sauer (09/20/2019) pt can use arm functionally and WBAT, sling for comfort    Mobility  Bed Mobility   Bed Mobility: Sit to Supine           General bed mobility comments: sitting up at EOB on arrival  Transfers Overall transfer level: Needs assistance Equipment used: Straight cane Transfers: Sit to/from Stand Sit to Stand: Min guard;Min assist(min initially, min guard mostly with better technique)         General transfer comment: VCs for sequencing and safety when  standing  Ambulation/Gait Ambulation/Gait assistance: Min guard;Min assist(episodes of min when fatigued.) Gait Distance (Feet): 280 Feet(with 1 standing and 1 sitting rest break) Assistive device: Straight cane Gait Pattern/deviations: Step-to pattern;Step-through pattern;Decreased stride length Gait velocity: decreased Gait velocity interpretation: <1.8 ft/sec, indicate of risk for recurrent falls General Gait Details: Demo'd sequencing with the spc and cued for improved use during initial training,  Pt  improved sequencing significantly during gait training.   Stairs             Wheelchair Mobility    Modified Rankin (Stroke Patients Only)       Balance Overall balance assessment: Needs assistance Sitting-balance support: Feet supported Sitting balance-Leahy Scale: Good       Standing balance-Leahy Scale: Fair                              Cognition Arousal/Alertness: Awake/alert Behavior During Therapy: WFL for tasks assessed/performed Overall Cognitive Status: Within Functional Limits for tasks assessed                                        Exercises      General Comments General comments (skin integrity, edema, etc.): vss      Pertinent Vitals/Pain Pain Assessment: 0-10 Pain Score: 8  Pain Location: L UE Pain Descriptors / Indicators: Discomfort;Guarding;Sore Pain Intervention(s): Monitored during session    Home Living  Prior Function            PT Goals (current goals can now be found in the care plan section) Acute Rehab PT Goals Patient Stated Goal: walk PT Goal Formulation: With patient Time For Goal Achievement: 10/02/19 Potential to Achieve Goals: Good Progress towards PT goals: Progressing toward goals    Frequency    Min 3X/week      PT Plan Current plan remains appropriate    Co-evaluation              AM-PAC PT "6 Clicks" Mobility   Outcome Measure   Help needed turning from your back to your side while in a flat bed without using bedrails?: A Little Help needed moving from lying on your back to sitting on the side of a flat bed without using bedrails?: A Little Help needed moving to and from a bed to a chair (including a wheelchair)?: A Little Help needed standing up from a chair using your arms (e.g., wheelchair or bedside chair)?: A Little Help needed to walk in hospital room?: A Little Help needed climbing 3-5 steps with a railing? : A Lot 6 Click Score: 17    End of Session   Activity Tolerance: Patient tolerated treatment well Patient left: with call bell/phone within reach;with chair alarm set;in bed;Other (comment)(sitting EOB) Nurse Communication: Mobility status PT Visit Diagnosis: Unsteadiness on feet (R26.81);Muscle weakness (generalized) (M62.81) Pain - Right/Left: Left Pain - part of body: Arm     Time: 1829-9371 PT Time Calculation (min) (ACUTE ONLY): 24 min  Charges:  $Gait Training: 8-22 mins $Therapeutic Activity: 8-22 mins                     09/25/2019  Vanessa Carne., PT Acute Rehabilitation Services [phone]  (pager) [phone]  (office)   Vanessa [name] 09/25/2019, 2:50 PM

## 2019-09-25 NOTE — Progress Notes (Signed)
Pt is discharged to go to [facility] via [facility] with a PICC line to continue IV antibiotics.  Attempted to give report twice.  Discharge instructions and prescriptions sent with PTAR.

## 2019-09-26 LAB — TYPE AND SCREEN
ABO/RH(D): O POS
Antibody Screen: NEGATIVE
Unit division: 0

## 2019-09-26 LAB — BPAM RBC
Blood Product Expiration Date: 202103242359
ISSUE DATE / TIME: 202103171446
Unit Type and Rh: 5100

## 2019-10-02 ENCOUNTER — Inpatient Hospital Stay: Payer: Medicare Other | Admitting: Gynecologic Oncology

## 2019-10-02 ENCOUNTER — Encounter: Payer: Medicare Other | Admitting: Family Medicine

## 2019-10-04 ENCOUNTER — Telehealth: Payer: Self-pay | Admitting: Gastroenterology

## 2019-10-04 ENCOUNTER — Encounter (HOSPITAL_COMMUNITY): Payer: Self-pay | Admitting: Family Medicine

## 2019-10-04 ENCOUNTER — Encounter: Payer: Self-pay | Admitting: Gynecologic Oncology

## 2019-10-04 ENCOUNTER — Other Ambulatory Visit: Payer: Self-pay

## 2019-10-04 ENCOUNTER — Inpatient Hospital Stay (HOSPITAL_COMMUNITY)
Admission: EM | Admit: 2019-10-04 | Discharge: 2019-10-10 | DRG: 378 | Disposition: A | Discharge status: Skilled Nursing Facility | Payer: Medicare Other | Source: Skilled Nursing Facility | Attending: Internal Medicine | Admitting: Internal Medicine

## 2019-10-04 DIAGNOSIS — E1121 Type 2 diabetes mellitus with diabetic nephropathy: Secondary | ICD-10-CM | POA: Diagnosis not present

## 2019-10-04 DIAGNOSIS — N183 Chronic kidney disease, stage 3 unspecified: Secondary | ICD-10-CM | POA: Diagnosis present

## 2019-10-04 DIAGNOSIS — K224 Dyskinesia of esophagus: Secondary | ICD-10-CM

## 2019-10-04 DIAGNOSIS — B9561 Methicillin susceptible Staphylococcus aureus infection as the cause of diseases classified elsewhere: Secondary | ICD-10-CM

## 2019-10-04 DIAGNOSIS — K264 Chronic or unspecified duodenal ulcer with hemorrhage: Secondary | ICD-10-CM | POA: Diagnosis not present

## 2019-10-04 DIAGNOSIS — I2510 Atherosclerotic heart disease of native coronary artery without angina pectoris: Secondary | ICD-10-CM | POA: Diagnosis present

## 2019-10-04 DIAGNOSIS — Z6841 Body Mass Index (BMI) 40.0 and over, adult: Secondary | ICD-10-CM

## 2019-10-04 DIAGNOSIS — N1832 Chronic kidney disease, stage 3b: Secondary | ICD-10-CM | POA: Diagnosis not present

## 2019-10-04 DIAGNOSIS — Z79899 Other long term (current) drug therapy: Secondary | ICD-10-CM

## 2019-10-04 DIAGNOSIS — I160 Hypertensive urgency: Secondary | ICD-10-CM | POA: Diagnosis present

## 2019-10-04 DIAGNOSIS — E785 Hyperlipidemia, unspecified: Secondary | ICD-10-CM | POA: Diagnosis present

## 2019-10-04 DIAGNOSIS — Z791 Long term (current) use of non-steroidal anti-inflammatories (NSAID): Secondary | ICD-10-CM

## 2019-10-04 DIAGNOSIS — I10 Essential (primary) hypertension: Secondary | ICD-10-CM

## 2019-10-04 DIAGNOSIS — Z20822 Contact with and (suspected) exposure to covid-19: Secondary | ICD-10-CM | POA: Diagnosis present

## 2019-10-04 DIAGNOSIS — C541 Malignant neoplasm of endometrium: Secondary | ICD-10-CM

## 2019-10-04 DIAGNOSIS — Z794 Long term (current) use of insulin: Secondary | ICD-10-CM

## 2019-10-04 DIAGNOSIS — D649 Anemia, unspecified: Secondary | ICD-10-CM | POA: Diagnosis not present

## 2019-10-04 DIAGNOSIS — I129 Hypertensive chronic kidney disease with stage 1 through stage 4 chronic kidney disease, or unspecified chronic kidney disease: Secondary | ICD-10-CM | POA: Diagnosis present

## 2019-10-04 DIAGNOSIS — E1122 Type 2 diabetes mellitus with diabetic chronic kidney disease: Secondary | ICD-10-CM | POA: Diagnosis present

## 2019-10-04 DIAGNOSIS — J45909 Unspecified asthma, uncomplicated: Secondary | ICD-10-CM | POA: Diagnosis present

## 2019-10-04 DIAGNOSIS — E119 Type 2 diabetes mellitus without complications: Secondary | ICD-10-CM

## 2019-10-04 DIAGNOSIS — D539 Nutritional anemia, unspecified: Secondary | ICD-10-CM | POA: Diagnosis present

## 2019-10-04 DIAGNOSIS — R7881 Bacteremia: Secondary | ICD-10-CM

## 2019-10-04 DIAGNOSIS — K921 Melena: Secondary | ICD-10-CM | POA: Diagnosis not present

## 2019-10-04 DIAGNOSIS — L89892 Pressure ulcer of other site, stage 2: Secondary | ICD-10-CM | POA: Diagnosis present

## 2019-10-04 LAB — COMPREHENSIVE METABOLIC PANEL
ALT: 6 U/L (ref 0–44)
AST: 18 U/L (ref 15–41)
Albumin: 2.5 g/dL — ABNORMAL LOW (ref 3.5–5.0)
Alkaline Phosphatase: 95 U/L (ref 38–126)
Anion gap: 10 (ref 5–15)
BUN: 40 mg/dL — ABNORMAL HIGH (ref 8–23)
CO2: 19 mmol/L — ABNORMAL LOW (ref 22–32)
Calcium: 8.3 mg/dL — ABNORMAL LOW (ref 8.9–10.3)
Chloride: 109 mmol/L (ref 98–111)
Creatinine, Ser: 1.76 mg/dL — ABNORMAL HIGH (ref 0.44–1.00)
GFR calc Af Amer: 35 mL/min — ABNORMAL LOW (ref >60)
GFR calc non Af Amer: 30 mL/min — ABNORMAL LOW (ref >60)
Glucose, Bld: 129 mg/dL — ABNORMAL HIGH (ref 70–99)
Potassium: 3.5 mmol/L (ref 3.5–5.1)
Sodium: 138 mmol/L (ref 135–145)
Total Bilirubin: 0.5 mg/dL (ref 0.3–1.2)
Total Protein: 7.1 g/dL (ref 6.5–8.1)

## 2019-10-04 LAB — CBC
HCT: 19.3 % — ABNORMAL LOW (ref 36.0–46.0)
Hemoglobin: 5.7 g/dL — CL (ref 12.0–15.0)
MCH: 27.4 pg (ref 26.0–34.0)
MCHC: 29.5 g/dL — ABNORMAL LOW (ref 30.0–36.0)
MCV: 92.8 fL (ref 80.0–100.0)
Platelets: 365 10*3/uL (ref 150–400)
RBC: 2.08 MIL/uL — ABNORMAL LOW (ref 3.87–5.11)
RDW: 23.4 % — ABNORMAL HIGH (ref 11.5–15.5)
WBC: 12.1 10*3/uL — ABNORMAL HIGH (ref 4.0–10.5)
nRBC: 0 % (ref 0.0–0.2)

## 2019-10-04 LAB — GLUCOSE, CAPILLARY: Glucose-Capillary: 113 mg/dL — ABNORMAL HIGH (ref 70–99)

## 2019-10-04 LAB — CBG MONITORING, ED: Glucose-Capillary: 132 mg/dL — ABNORMAL HIGH (ref 70–99)

## 2019-10-04 LAB — PREPARE RBC (CROSSMATCH)

## 2019-10-04 LAB — POC OCCULT BLOOD, ED: Fecal Occult Bld: POSITIVE — AB

## 2019-10-04 MED ORDER — BENZOCAINE 10 % MT GEL
1.0000 "application " | Freq: Four times a day (QID) | OROMUCOSAL | Status: DC | PRN
  Administered 2019-10-05 – 2019-10-09 (×3): 1 via OROMUCOSAL
  Filled 2019-10-04 (×2): qty 9

## 2019-10-04 MED ORDER — SODIUM CHLORIDE 0.9% FLUSH
3.0000 mL | Freq: Two times a day (BID) | INTRAVENOUS | Status: DC
  Administered 2019-10-05 (×2): 3 mL via INTRAVENOUS

## 2019-10-04 MED ORDER — ACETAMINOPHEN 325 MG PO TABS
650.0000 mg | ORAL_TABLET | Freq: Four times a day (QID) | ORAL | Status: DC | PRN
  Administered 2019-10-05 – 2019-10-10 (×12): 650 mg via ORAL
  Filled 2019-10-04 (×12): qty 2

## 2019-10-04 MED ORDER — PANTOPRAZOLE SODIUM 40 MG IV SOLR
40.0000 mg | Freq: Two times a day (BID) | INTRAVENOUS | Status: DC
  Administered 2019-10-04 – 2019-10-07 (×7): 40 mg via INTRAVENOUS
  Filled 2019-10-04 (×7): qty 40

## 2019-10-04 MED ORDER — FENTANYL CITRATE (PF) 100 MCG/2ML IJ SOLN: 25.0000 ug | INTRAMUSCULAR | Status: DC | PRN

## 2019-10-04 MED ORDER — SODIUM CHLORIDE 0.9 % IV SOLN: 250.0000 mL | INTRAVENOUS | Status: DC | PRN

## 2019-10-04 MED ORDER — SODIUM CHLORIDE 0.9 % IV SOLN
10.0000 mL/h | Freq: Once | INTRAVENOUS | Status: AC
  Administered 2019-10-04: 10 mL/h via INTRAVENOUS

## 2019-10-04 MED ORDER — ACETAMINOPHEN 650 MG RE SUPP: 650.0000 mg | Freq: Four times a day (QID) | RECTAL | Status: DC | PRN

## 2019-10-04 MED ORDER — ONDANSETRON HCL 4 MG PO TABS: 4.0000 mg | ORAL_TABLET | Freq: Four times a day (QID) | ORAL | Status: DC | PRN

## 2019-10-04 MED ORDER — ONDANSETRON HCL 4 MG/2ML IJ SOLN: 4.0000 mg | Freq: Four times a day (QID) | INTRAMUSCULAR | Status: DC | PRN

## 2019-10-04 MED ORDER — SODIUM CHLORIDE 0.9% FLUSH: 3.0000 mL | INTRAVENOUS | Status: DC | PRN

## 2019-10-04 MED ORDER — LABETALOL HCL 5 MG/ML IV SOLN
10.0000 mg | INTRAVENOUS | Status: DC | PRN
  Administered 2019-10-04 – 2019-10-08 (×8): 10 mg via INTRAVENOUS
  Filled 2019-10-04 (×8): qty 4

## 2019-10-04 MED ORDER — CEFAZOLIN SODIUM-DEXTROSE 2-4 GM/100ML-% IV SOLN
2.0000 g | Freq: Two times a day (BID) | INTRAVENOUS | Status: DC
  Administered 2019-10-04 – 2019-10-06 (×4): 2 g via INTRAVENOUS
  Filled 2019-10-04 (×5): qty 100

## 2019-10-04 MED ORDER — INSULIN ASPART 100 UNIT/ML ~~LOC~~ SOLN: 0.0000 [IU] | SUBCUTANEOUS | Status: DC

## 2019-10-04 MED ORDER — DIPHENHYDRAMINE HCL 50 MG/ML IJ SOLN
25.0000 mg | Freq: Four times a day (QID) | INTRAMUSCULAR | Status: AC | PRN
  Administered 2019-10-04 – 2019-10-05 (×2): 25 mg via INTRAVENOUS
  Filled 2019-10-04 (×2): qty 1

## 2019-10-04 MED ORDER — SODIUM CHLORIDE 0.9% FLUSH
3.0000 mL | Freq: Two times a day (BID) | INTRAVENOUS | Status: DC
  Administered 2019-10-05: 3 mL via INTRAVENOUS

## 2019-10-04 NOTE — ED Notes (Signed)
Report given to [facility] ED RN

## 2019-10-04 NOTE — Progress Notes (Deleted)
GYNECOLOGIC ONCOLOGY NEW PATIENT CONSULTATION   Patient Name: Vanessa [name]  Patient Age: 64 y.o. Date of Service: *** Referring Provider: No referring provider defined for this encounter.   Primary Care Provider: Maren Reamer, MD (Inactive) Consulting Provider: Jeral Pinch, MD   Assessment/Plan:  ***   A copy of this note was sent to the patient's referring provider.   Jeral Pinch, MD  Division of Gynecologic Oncology  Department of Obstetrics and Gynecology  [facility]  ___________________________________________  Chief Complaint: No chief complaint on file.   History of Present Illness:  Vanessa [name] is a 64 y.o. y.o. female who is seen in consultation at the request of No ref. provider found for an evaluation of newly diagnosed endometrial cancer.  The patient was recently admitted (discharged on 3/17) with symptomatic anemia in the setting of PMB and was treated for MSSA bacteremia. She was started on IV cefazolin with a plan for 6 weeks of IV antibiotic therapy (EOT date 4/22). She received 3u of pRBCs, was started on Megace (improved bleeding), and EMB was performed revealing endometrial cancer, FIGO grade II, endometrioid type.  PAST MEDICAL HISTORY:  Past Medical History:  Diagnosis Date  . Asthma   . Diabetes mellitus without complication ([place])   . Heart murmur   . Hypertension      PAST SURGICAL HISTORY:  Past Surgical History:  Procedure Laterality Date  . TYMPANOSTOMY TUBE PLACEMENT Bilateral     OB/GYN HISTORY:  OB History  Gravida Para Term Preterm AB Living  4 4 3 1   4   SAB TAB Ectopic Multiple Live Births          4    # Outcome Date GA Lbr Len/2nd Weight Sex Delivery Anes PTL Lv  4 Term 49    F Vag-Spont   LIV  3 Term 30    M Vag-Spont   LIV  2 Term 4    M Vag-Spont   LIV  1 Preterm 1984    M Vag-Spont   LIV    Patient's last menstrual period was 03/17/2015.  Age at menarche: ***   Age at menopause: *** Hx of HRT: *** Hx of STDs: *** Last pap: 09/2019 - atypical endometrial cells, NOS History of abnormal pap smears: ***  SCREENING STUDIES:  Last mammogram: ***  Last colonoscopy: *** Last bone mineral density: ***  MEDICATIONS: Outpatient Encounter Medications as of 10/07/2019  Medication Sig  . acetaminophen (TYLENOL) 500 MG tablet Take 500 mg by mouth every 6 (six) hours as needed for moderate pain.  . benzocaine (ORAJEL) 10 % mucosal gel Use as directed 1 application in the mouth or throat 4 (four) times daily as needed for mouth pain.  . Blood Glucose Monitoring Suppl (BAYER CONTOUR MONITOR) w/Device KIT Use as directed for 3 times daily testing of blood glucose. E11.9  . ceFAZolin (ANCEF) IVPB Inject 2 g into the vein every 12 (twelve) hours. Indication:  MSSA Bacteremia Last Day of Therapy:  10/31/2019 Labs - Once weekly:  CBC/D and BMP, Labs - Every other week:  ESR and CRP  . diclofenac Sodium (VOLTAREN) 1 % GEL Apply 2 g topically 4 (four) times daily. Apply to left shoulder.  . ferrous sulfate 325 (65 FE) MG tablet Take 1 tablet (325 mg total) by mouth 2 (two) times daily with a meal.  . glucose blood (BAYER CONTOUR TEST) test strip Use as instructed for 3 times daily testing of blood  glucose. E11.9  . INSULIN SYRINGE 1CC/29G 29G X 1/2" 1 ML MISC Insulin syringes  . labetalol (NORMODYNE) 200 MG tablet Take 1 tablet (200 mg total) by mouth 3 (three) times daily.  . Lancets (ONETOUCH ULTRASOFT) lancets Use as instructed  . megestrol (MEGACE) 40 MG tablet Take 2 tablets (80 mg total) by mouth 2 (two) times daily.  Marland Kitchen oxyCODONE (OXY IR/ROXICODONE) 5 MG immediate release tablet Take 1 tablet (5 mg total) by mouth every 6 (six) hours as needed for moderate pain.  . polyethylene glycol (MIRALAX / GLYCOLAX) 17 g packet Take 17 g by mouth daily.   No facility-administered encounter medications on file as of 10/07/2019.    ALLERGIES:  No Known Allergies    FAMILY HISTORY:  Family History  Problem Relation Age of Onset  . Stroke Mother   . Diabetes Mother   . Hypertension Mother   . Diabetes Brother   . Hypertension Brother      SOCIAL HISTORY:    Social Connections:   . Frequency of Communication with Friends and Family:   . Frequency of Social Gatherings with Friends and Family:   . Attends Religious Services:   . Active Member of Clubs or Organizations:   . Attends Archivist Meetings:   Marland Kitchen Marital Status:     REVIEW OF SYSTEMS:  Denies appetite changes, fevers, chills, fatigue, unexplained weight changes. Denies hearing loss, neck lumps or masses, mouth sores, ringing in ears or voice changes. Denies cough or wheezing.  Denies shortness of breath. Denies chest pain or palpitations. Denies leg swelling. Denies abdominal distention, pain, blood in stools, constipation, diarrhea, nausea, vomiting, or early satiety. Denies pain with intercourse, dysuria, frequency, hematuria or incontinence. Denies hot flashes, pelvic pain, vaginal bleeding or vaginal discharge.   Denies joint pain, back pain or muscle pain/cramps. Denies itching, rash, or wounds. Denies dizziness, headaches, numbness or seizures. Denies swollen lymph nodes or glands, denies easy bruising or bleeding. Denies anxiety, depression, confusion, or decreased concentration.  Physical Exam:  Vital Signs for this encounter:  Last menstrual period 03/17/2015. There is no height or weight on file to calculate BMI. General: Alert, oriented, no acute distress.  HEENT: Normocephalic, atraumatic. Sclera anicteric.  Chest: Clear to auscultation bilaterally. No wheezes, rhonchi, or rales. Cardiovascular: Regular rate and rhythm, no murmurs, rubs, or gallops.  Abdomen: ***Obese. Normoactive bowel sounds. Soft, nondistended, nontender to palpation. No masses or hepatosplenomegaly appreciated. No palpable fluid wave.  Extremities: Grossly normal range of motion.  Warm, well perfused. No edema bilaterally.  Skin: No rashes or lesions.  Lymphatics: No cervical, supraclavicular, or inguinal adenopathy.  GU:  Normal external female genitalia. ***  No lesions. No discharge or bleeding.             Bladder/urethra:  No lesions or masses, well supported bladder             Vagina: ***             Cervix: Normal appearing, no lesions.             Uterus: *** Small, mobile, no parametrial involvement or nodularity.             Adnexa: *** masses.  Rectal: ***  LABORATORY AND RADIOLOGIC DATA:  ***Outside medical records were reviewed to synthesize the above history, along with the history and physical obtained during the visit.   Lab Results  Component Value Date   WBC 15.3 (H) 09/25/2019   HGB 7.0 (  L) 09/25/2019   HCT 24.0 (L) 09/25/2019   PLT 591 (H) 09/25/2019   GLUCOSE 85 09/25/2019   CHOL 153 08/10/2015   TRIG 82 08/10/2015   HDL 61 08/10/2015   LDLCALC 76 08/10/2015   ALT 15 09/17/2019   AST 14 (L) 09/17/2019   NA 140 09/25/2019   K 4.0 09/25/2019   CL 108 09/25/2019   CREATININE 1.68 (H) 09/25/2019   BUN 24 (H) 09/25/2019   CO2 20 (L) 09/25/2019   TSH 3.639 09/15/2019   INR 1.1 09/17/2019   HGBA1C 5.7 (H) 09/15/2019   MICROALBUR 12.5 03/24/2016   Pelvic ultrasound 09/14/19: IMPRESSION: Endometrium is poorly defined, however appears thickened measuring approximately 8-9 mm. In the setting of post-menopausal bleeding, endometrial sampling is indicated to exclude carcinoma. If results are benign, sonohysterogram should be considered for focal lesion work-up. (Ref: Radiological Reasoning: Algorithmic Workup of Abnormal Vaginal Bleeding with Endovaginal Sonography and Sonohysterography. AJR 2008; 488:Q91-69)  CT A/P on 3/7: 1. Suboptimal evaluation of the uterus in the absence of IV contrast. See separate recent ultrasound for further evaluation and recommendations. 2. Anemia. 3. Small right-sided pleural effusion. 4. Trace abdominal  ascites. 5. Atrophic left kidney which may be secondary to a distal left ureteral stone measuring approximately 7 mm. The left kidney is likely nonfunctional as there is no left-sided hydronephrosis.  EMB on 3/11: A. ENDOMETRIUM, BIOPSY:  - Endometrial adenocarcinoma, see comment.  - Fragment of endometrial type polyp.

## 2019-10-04 NOTE — Progress Notes (Signed)
Pharmacy Antibiotic Note  Vanessa [name] is a 64 y.o. female admitted on 10/04/2019 with MSSA bacteremia. Pharmacy has been consulted for cefazolin dosing.  Cefazolin started during recent admission with end-date 4/22 per ID.   Plan: cefazolin 2g IV Q12h     Temp (24hrs), Avg:98 F (36.7 C), Min:98 F (36.7 C), Max:98 F (36.7 C)  Recent Labs  Lab 10/04/19 1806  WBC 12.1*  CREATININE 1.76*    Estimated Creatinine Clearance: 29.2 mL/min (A) (by C-G formula based on SCr of 1.76 mg/dL (H)).    No Known Allergies  Antimicrobials this admission: 3/26 cefazolin >>   Dose adjustments this admission: N/A  Microbiology results: N/A  Thank you for allowing pharmacy to be a part of this patient's care.  Kennon Holter, PharmD PGY1 Ambulatory Care Pharmacy Resident 10/04/2019 8:18 PM

## 2019-10-04 NOTE — H&P (Signed)
History and Physical    Vanessa [name] ZCH:[id number] DOB: 1956-02-25 DOA: 10/04/2019  PCP: Maren Reamer, MD (Inactive)   Patient coming from: SNF   Chief Complaint: Low Hgb, fatigue  HPI: Vanessa [name] is a 64 y.o. female with medical history significant for hypertension, type 2 diabetes mellitus, recent diagnosis endometrial cancer, and MSSA bacteremia currently on IV cefazolin, now presenting from her SNF for evaluation of low hemoglobin.  Patient reports that she is slightly more tired than usual, but denies any lightheadedness or chest pain.  She reports spending most of her days in bed for the past month.  She has not noted any vaginal bleeding since the recent hospital discharge, has not had any fevers or chills, has had dark stool but attributing this to iron, and denies any abdominal pain, nausea, vomiting, or constipation.  Hemoglobin was 7.0 when she was discharged 9 days ago, found to be in the mid 5 range at her SNF today, prompting transfer to the ED.  ED Course: Upon arrival to the ED, patient is found to be afebrile, saturating well on room air, and hypertensive to 190/90.  EKG features a sinus rhythm.  Chemistry panel with BUN 40 and creatinine 1.76, up from 1.68 at time of recent discharge.  CBC with leukocytosis to 12,100 and normocytic anemia with hemoglobin 5.7, down from 7.0 on 09/25/2019.  Fecal occult blood testing was positive.  COVID-19 PCR screening test in process.  2 units of packed red blood cells were ordered from the ED and hospitalist consulted for admission.  Review of Systems:  All other systems reviewed and apart from HPI, are negative.  Past Medical History:  Diagnosis Date  . Asthma   . Diabetes mellitus without complication ([place])   . Endometrial cancer ([place])   . Heart murmur   . Hypertension   . MSSA bacteremia 09/2019    Past Surgical History:  Procedure Laterality Date  . TYMPANOSTOMY TUBE PLACEMENT Bilateral      reports that she has  never smoked. She has never used smokeless tobacco. She reports that she does not drink alcohol or use drugs.  No Known Allergies  Family History  Problem Relation Age of Onset  . Stroke Mother   . Diabetes Mother   . Hypertension Mother   . Diabetes Brother   . Hypertension Brother      Prior to Admission medications   Medication Sig Start Date End Date Taking? Authorizing Provider  acetaminophen (TYLENOL) 500 MG tablet Take 500 mg by mouth every 6 (six) hours as needed for moderate pain.   Yes [provider]  benzocaine (ORAJEL) 10 % mucosal gel Use as directed 1 application in the mouth or throat 4 (four) times daily as needed for mouth pain. 09/25/19  Yes Arrien, Jimmy Picket, MD  Blood Glucose Monitoring Suppl (BAYER CONTOUR MONITOR) w/Device KIT Use as directed for 3 times daily testing of blood glucose. E11.9 12/01/16  Yes Langeland, Dawn T, MD  ceFAZolin (ANCEF) IVPB Inject 2 g into the vein every 12 (twelve) hours. Indication:  MSSA Bacteremia Last Day of Therapy:  10/31/2019 Labs - Once weekly:  CBC/D and BMP, Labs - Every other week:  ESR and CRP 09/25/19 11/02/19 Yes Arrien, Jimmy Picket, MD  diclofenac Sodium (VOLTAREN) 1 % GEL Apply 2 g topically 4 (four) times daily. Apply to left shoulder. 09/25/19  Yes Arrien, Jimmy Picket, MD  ferrous sulfate 325 (65 FE) MG tablet Take 1 tablet (325 mg total)  by mouth 2 (two) times daily with a meal. 09/25/19 10/25/19 Yes Arrien, Jimmy Picket, MD  glucose blood (BAYER CONTOUR TEST) test strip Use as instructed for 3 times daily testing of blood glucose. E11.9 12/01/16  Yes Maren Reamer, MD  INSULIN SYRINGE 1CC/29G 29G X 1/2" 1 ML MISC Insulin syringes 03/06/14  Yes Rolene Course, PA-C  labetalol (NORMODYNE) 200 MG tablet Take 1 tablet (200 mg total) by mouth 3 (three) times daily. 09/25/19 10/25/19 Yes Arrien, Jimmy Picket, MD  Lancets [facility] ULTRASOFT) lancets Use as instructed 11/29/16  Yes Maren Reamer, MD    megestrol (MEGACE) 40 MG tablet Take 2 tablets (80 mg total) by mouth 2 (two) times daily. 09/25/19 10/25/19 Yes Arrien, Jimmy Picket, MD  oxyCODONE (OXY IR/ROXICODONE) 5 MG immediate release tablet Take 1 tablet (5 mg total) by mouth every 6 (six) hours as needed for moderate pain. 09/25/19  Yes Arrien, Jimmy Picket, MD  polyethylene glycol (MIRALAX / GLYCOLAX) 17 g packet Take 17 g by mouth daily. 09/25/19  Yes Arrien, Jimmy Picket, MD    Physical Exam: Vitals:   10/04/19 1810 10/04/19 1815 10/04/19 1830 10/04/19 2000  BP:  (!) 189/89 (!) 194/86 (!) 181/82  Pulse:    85  Resp: 13 (!) 22 19 18   Temp: 98 F (36.7 C)     TempSrc: Oral     SpO2:    100%    Constitutional: NAD, calm  Eyes: PERTLA, lids and conjunctivae normal ENMT: Mucous membranes are moist. Posterior pharynx clear of any exudate or lesions.   Neck: normal, supple, no masses, no thyromegaly Respiratory:  no wheezing, no crackles. No accessory muscle use.  Cardiovascular: S1 & S2 heard, regular rate and rhythm. Bilateral lower extremity edema. Abdomen: No distension, no tenderness, soft. Bowel sounds active.  Musculoskeletal: no clubbing / cyanosis. No joint deformity upper and lower extremities.   Skin: no significant rashes, lesions, ulcers. Warm, dry, well-perfused. Neurologic: CN 2-12 grossly intact. Sensation intact. Moving all extremities.  Psychiatric: Alert and oriented x 3. Very pleasant and cooperative.    Labs and Imaging on Admission: I have personally reviewed following labs and imaging studies  CBC: Recent Labs  Lab 10/04/19 1806  WBC 12.1*  HGB 5.7*  HCT 19.3*  MCV 92.8  PLT 703   Basic Metabolic Panel: Recent Labs  Lab 10/04/19 1806  NA 138  K 3.5  CL 109  CO2 19*  GLUCOSE 129*  BUN 40*  CREATININE 1.76*  CALCIUM 8.3*   GFR: Estimated Creatinine Clearance: 29.2 mL/min (A) (by C-G formula based on SCr of 1.76 mg/dL (H)). Liver Function Tests: Recent Labs  Lab  10/04/19 1806  AST 18  ALT 6  ALKPHOS 95  BILITOT 0.5  PROT 7.1  ALBUMIN 2.5*   No results for input(s): LIPASE, AMYLASE in the last 168 hours. No results for input(s): AMMONIA in the last 168 hours. Coagulation Profile: No results for input(s): INR, PROTIME in the last 168 hours. Cardiac Enzymes: No results for input(s): CKTOTAL, CKMB, CKMBINDEX, TROPONINI in the last 168 hours. BNP (last 3 results) No results for input(s): PROBNP in the last 8760 hours. HbA1C: No results for input(s): HGBA1C in the last 72 hours. CBG: No results for input(s): GLUCAP in the last 168 hours. Lipid Profile: No results for input(s): CHOL, HDL, LDLCALC, TRIG, CHOLHDL, LDLDIRECT in the last 72 hours. Thyroid Function Tests: No results for input(s): TSH, T4TOTAL, FREET4, T3FREE, THYROIDAB in the last 72 hours. Anemia Panel:  No results for input(s): VITAMINB12, FOLATE, FERRITIN, TIBC, IRON, RETICCTPCT in the last 72 hours. Urine analysis:    Component Value Date/Time   COLORURINE AMBER (A) 09/18/2019 1151   APPEARANCEUR CLOUDY (A) 09/18/2019 1151   LABSPEC 1.019 09/18/2019 1151   PHURINE 5.0 09/18/2019 1151   GLUCOSEU NEGATIVE 09/18/2019 1151   HGBUR LARGE (A) 09/18/2019 1151   BILIRUBINUR NEGATIVE 09/18/2019 1151   KETONESUR NEGATIVE 09/18/2019 1151   PROTEINUR 100 (A) 09/18/2019 1151   UROBILINOGEN 0.2 03/06/2014 1644   NITRITE NEGATIVE 09/18/2019 1151   LEUKOCYTESUR SMALL (A) 09/18/2019 1151   Sepsis Labs: @LABRCNTIP (procalcitonin:4,lacticidven:4) )No results found for this or any previous visit (from the past 240 hour(s)).   Radiological Exams on Admission: No results found.  EKG: Independently reviewed. Sinus rhythm.   Assessment/Plan   1. Symptomatic anemia; GI bleeding  - Presents from SNF with low Hgb, recently had vaginal bleeding and was transfused 3 units earlier this month, now with dark stools on iron, no more vaginal bleeding, FOBT+, increased BUN, hemodynamically stable   - Hgb is 5.7, down from 7.0 on 09/25/19  - 2 units RBC ordered from ED  - Start IV PPI q12h, check post-transfusion CBC    2. MSSA bacteremia   - MSSA grew from cultures on 3/9; 3/11 blood cultures negative  - She had no vegetations on TTE and TEE probe could not be passed  - No recent fevers  - Continue IV cefazolin with end-date planned for April 22    3. CKD IIIb - SCr is 1.76 in ED, similar to recent priors  - Renally-dose medications, monitor    4. Hypertensive urgency  - SBP 190s in ED  - Use labetalol IVPs for now while NPO    5. Esophageal dysmotility - She was placed on dysphagia 3 diet during recent admission, currently NPO    6. Type II DM  - A1c was only 5.7% earlier in March 2021  - Check CBGs and use a low-intensity SSI with Novolog if needed    7. Endometrial cancer  - Diagnosed earlier this month, bleeding has stopped, she was started on Megace and has GYN-onc referral      DVT prophylaxis: SCD Code Status: Full Family Communication: Discussed with patient  Disposition Plan: Likely back to SNF once Hgb >7 and stable  Consults called: none  Admission status: Observation; initial orders placed prior to FOBT and with suspicion that anemia was secondary to recent vaginal bleeding that has since stopped. Now that FOBT is positive and UGIB is likely, she will likely need to be converted to inpatient.    Vianne Bulls, MD [facility] Pager: See [email or web address]  If 7AM-7PM, please contact the daytime attending [email or web address]  10/04/2019, 8:13 PM

## 2019-10-04 NOTE — ED Provider Notes (Signed)
[facility] EMERGENCY DEPARTMENT Provider Note   CSN: [id number] Arrival date & time: 10/04/19  1726     History Chief Complaint  Patient presents with  . low hemoglobin    Vanessa [name] is a 64 y.o. female.  HPI Patient presents to the emergency department with some general weakness over the last 2 days.  The hemoglobin that was drawn today at the facility found in her number was 5.5.  The patient states she is not feeling real badly other than weakness.  She states that nothing else seems to be bothering her.  She has not noticed any blood in her stools and is no longer had any vaginal bleeding.  The patient has had some weakness but no other symptoms.  Patient states that she is on no new medications.  The patient denies chest pain, shortness of breath, headache,blurred vision, neck pain, fever, cough,  numbness, dizziness, anorexia, edema, abdominal pain, nausea, vomiting, diarrhea, rash, back pain, dysuria, hematemesis, bloody stool, near syncope, or syncope.    Past Medical History:  Diagnosis Date  . Asthma   . Diabetes mellitus without complication ([place])   . Endometrial cancer ([place])   . Heart murmur   . Hypertension   . MSSA bacteremia 09/2019    Patient Active Problem List   Diagnosis Date Noted  . Pressure injury of skin 09/22/2019  . 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 Left humeral fracture 09/21/2019  . Iron deficiency anemia due to chronic blood loss 09/21/2019  . MSSA bacteremia 09/18/2019  . Postmenopausal bleeding 09/15/2019  . Thrombocytopenia ([place]) 09/15/2019  . Generalized pruritus 09/15/2019  . CKD (chronic kidney disease), stage III 09/15/2019  . Hyperuricemia 09/15/2019  . Hypokalemia 09/15/2019  . Symptomatic anemia 09/14/2019  . Non compliance w medication regimen 03/24/2015  . Dyslipidemia 03/24/2015  . DM type 2 (diabetes mellitus, type 2) ([place]) 05/23/2014  . HTN (hypertension) 05/23/2014    Past Surgical  History:  Procedure Laterality Date  . TYMPANOSTOMY TUBE PLACEMENT Bilateral      OB History    Gravida  4   Para  4   Term  3   Preterm  1   AB      Living  4     SAB      TAB      Ectopic      Multiple      Live Births  4           Family History  Problem Relation Age of Onset  . Stroke Mother   . Diabetes Mother   . Hypertension Mother   . Diabetes Brother   . Hypertension Brother     Social History   Tobacco Use  . Smoking status: Never Smoker  . Smokeless tobacco: Never Used  Substance Use Topics  . Alcohol use: No  . Drug use: No    Home Medications Prior to Admission medications   Medication Sig Start Date End Date Taking? Authorizing Provider  acetaminophen (TYLENOL) 500 MG tablet Take 500 mg by mouth every 6 (six) hours as needed for moderate pain.   Yes [provider]  benzocaine (ORAJEL) 10 % mucosal gel Use as directed 1 application in the mouth or throat 4 (four) times daily as needed for mouth pain. 09/25/19  Yes Arrien, Jimmy Picket, MD  Blood Glucose Monitoring Suppl (BAYER CONTOUR MONITOR) w/Device KIT Use as directed for 3 times daily testing of blood glucose. E11.9 12/01/16  Yes  Lottie Mussel T, MD  ceFAZolin (ANCEF) IVPB Inject 2 g into the vein every 12 (twelve) hours. Indication:  MSSA Bacteremia Last Day of Therapy:  10/31/2019 Labs - Once weekly:  CBC/D and BMP, Labs - Every other week:  ESR and CRP 09/25/19 11/02/19 Yes Arrien, Jimmy Picket, MD  diclofenac Sodium (VOLTAREN) 1 % GEL Apply 2 g topically 4 (four) times daily. Apply to left shoulder. 09/25/19  Yes Arrien, Jimmy Picket, MD  ferrous sulfate 325 (65 FE) MG tablet Take 1 tablet (325 mg total) by mouth 2 (two) times daily with a meal. 09/25/19 10/25/19 Yes Arrien, Jimmy Picket, MD  glucose blood (BAYER CONTOUR TEST) test strip Use as instructed for 3 times daily testing of blood glucose. E11.9 12/01/16  Yes Maren Reamer, MD  INSULIN SYRINGE 1CC/29G  29G X 1/2" 1 ML MISC Insulin syringes 03/06/14  Yes Rolene Course, PA-C  labetalol (NORMODYNE) 200 MG tablet Take 1 tablet (200 mg total) by mouth 3 (three) times daily. 09/25/19 10/25/19 Yes Arrien, Jimmy Picket, MD  Lancets [facility] ULTRASOFT) lancets Use as instructed 11/29/16  Yes Maren Reamer, MD  megestrol (MEGACE) 40 MG tablet Take 2 tablets (80 mg total) by mouth 2 (two) times daily. 09/25/19 10/25/19 Yes Arrien, Jimmy Picket, MD  oxyCODONE (OXY IR/ROXICODONE) 5 MG immediate release tablet Take 1 tablet (5 mg total) by mouth every 6 (six) hours as needed for moderate pain. 09/25/19  Yes Arrien, Jimmy Picket, MD  polyethylene glycol (MIRALAX / GLYCOLAX) 17 g packet Take 17 g by mouth daily. 09/25/19  Yes Arrien, Jimmy Picket, MD    Allergies    Patient has no known allergies.  Review of Systems   Review of Systems All other systems negative except as documented in the HPI. All pertinent positives and negatives as reviewed in the HPI. Physical Exam Updated Vital Signs BP (!) 194/86   Pulse 86   Temp 98 F (36.7 C) (Oral)   Resp 19   LMP 03/17/2015   SpO2 100%   Physical Exam Vitals and nursing note reviewed.  Constitutional:      General: She is not in acute distress.    Appearance: She is well-developed.  HENT:     Head: Normocephalic and atraumatic.  Eyes:     Pupils: Pupils are equal, round, and reactive to light.  Cardiovascular:     Rate and Rhythm: Normal rate and regular rhythm.     Heart sounds: Normal heart sounds. No murmur. No friction rub. No gallop.   Pulmonary:     Effort: Pulmonary effort is normal. No respiratory distress.     Breath sounds: Normal breath sounds. No wheezing.  Abdominal:     General: Bowel sounds are normal. There is no distension.     Palpations: Abdomen is soft.     Tenderness: There is no abdominal tenderness.  Genitourinary:    Rectum: Guaiac result positive.  Musculoskeletal:     Cervical back: Normal range of  motion and neck supple.  Skin:    General: Skin is warm and dry.     Capillary Refill: Capillary refill takes less than 2 seconds.     Findings: No erythema or rash.  Neurological:     Mental Status: She is alert and oriented to person, place, and time.     Motor: No abnormal muscle tone.     Coordination: Coordination normal.  Psychiatric:        Behavior: Behavior normal.     ED  Results / Procedures / Treatments   Labs (all labs ordered are listed, but only abnormal results are displayed) Labs Reviewed  COMPREHENSIVE METABOLIC PANEL - Abnormal; Notable for the following components:      Result Value   CO2 19 (*)    Glucose, Bld 129 (*)    BUN 40 (*)    Creatinine, Ser 1.76 (*)    Calcium 8.3 (*)    Albumin 2.5 (*)    GFR calc non Af Amer 30 (*)    GFR calc Af Amer 35 (*)    All other components within normal limits  CBC - Abnormal; Notable for the following components:   WBC 12.1 (*)    RBC 2.08 (*)    Hemoglobin 5.7 (*)    HCT 19.3 (*)    MCHC 29.5 (*)    RDW 23.4 (*)    All other components within normal limits  SARS CORONAVIRUS 2 (TAT 6-24 HRS)  TYPE AND SCREEN  PREPARE RBC (CROSSMATCH)    EKG None  Radiology No results found.  Procedures Procedures (including critical care time)  Medications Ordered in ED Medications  0.9 %  sodium chloride infusion (has no administration in time range)    ED Course  I have reviewed the triage vital signs and the nursing notes.  Pertinent labs & imaging results that were available during my care of the patient were reviewed by me and considered in my medical decision making (see chart for details).    MDM Rules/Calculators/A&P                      She will need admission for symptomatic anemia.  The patient has been typed and screened and will be given 2 units here in the emergency department.  Patient's Hemoccult was positive.  I noted that she had a Hemoccult that was positive on her last admission as well.   Patient has been otherwise stable.  I spoke with the [facility] hospitalist who will admit the patient for further evaluation and care.  Patient's vital signs have remained stable as well in the emergency department. Final Clinical Impression(s) / ED Diagnoses Final diagnoses:  Symptomatic anemia    Rx / DC Orders ED Discharge Orders    None       Rebeca Allegra 10/04/19 2334    Valarie Merino, MD 10/11/19 [phone]

## 2019-10-04 NOTE — ED Notes (Signed)
Date and time results received: 10/04/19 1839(use smartphrase ".now" to insert current time)  Test: cbc (hgb)  Critical Value: 5.7  Name of Provider Notified: Database administrator, PA  Orders Received? Or Actions Taken?: .

## 2019-10-04 NOTE — ED Triage Notes (Signed)
Pt here with ems from [facility] with low hemoglobin at facility of 5.5. sent here by facility. Hx of bad vaginal bleeding two weeks ago and received blood transfusion. Denies feeling dizzy. Does feel weak and not her best today. Alert and oriented x4

## 2019-10-04 NOTE — Telephone Encounter (Signed)
This is a nursing home referral from [facility].  Referred for Dysphagia with esophageal dysmotility. Sending records up for review. She is seen by Dr Seward Carol [phone].  Please advise on scheduling

## 2019-10-04 NOTE — Telephone Encounter (Signed)
Can book with me or APP in the office, next available opening. I am not going to be in the office for the next 2 weeks and do not see records on my desk currently, but can review Epic chart for recent history.

## 2019-10-05 ENCOUNTER — Inpatient Hospital Stay: Payer: Self-pay

## 2019-10-05 DIAGNOSIS — N1832 Chronic kidney disease, stage 3b: Secondary | ICD-10-CM | POA: Diagnosis present

## 2019-10-05 DIAGNOSIS — L89892 Pressure ulcer of other site, stage 2: Secondary | ICD-10-CM | POA: Diagnosis present

## 2019-10-05 DIAGNOSIS — B9561 Methicillin susceptible Staphylococcus aureus infection as the cause of diseases classified elsewhere: Secondary | ICD-10-CM | POA: Diagnosis present

## 2019-10-05 DIAGNOSIS — J45909 Unspecified asthma, uncomplicated: Secondary | ICD-10-CM | POA: Diagnosis present

## 2019-10-05 DIAGNOSIS — K264 Chronic or unspecified duodenal ulcer with hemorrhage: Secondary | ICD-10-CM | POA: Diagnosis present

## 2019-10-05 DIAGNOSIS — D539 Nutritional anemia, unspecified: Secondary | ICD-10-CM | POA: Diagnosis present

## 2019-10-05 DIAGNOSIS — Z6841 Body Mass Index (BMI) 40.0 and over, adult: Secondary | ICD-10-CM | POA: Diagnosis not present

## 2019-10-05 DIAGNOSIS — K224 Dyskinesia of esophagus: Secondary | ICD-10-CM | POA: Diagnosis present

## 2019-10-05 DIAGNOSIS — C541 Malignant neoplasm of endometrium: Secondary | ICD-10-CM | POA: Diagnosis present

## 2019-10-05 DIAGNOSIS — D649 Anemia, unspecified: Secondary | ICD-10-CM | POA: Diagnosis not present

## 2019-10-05 DIAGNOSIS — Z79899 Other long term (current) drug therapy: Secondary | ICD-10-CM | POA: Diagnosis not present

## 2019-10-05 DIAGNOSIS — Z20822 Contact with and (suspected) exposure to covid-19: Secondary | ICD-10-CM | POA: Diagnosis present

## 2019-10-05 DIAGNOSIS — E1121 Type 2 diabetes mellitus with diabetic nephropathy: Secondary | ICD-10-CM | POA: Diagnosis not present

## 2019-10-05 DIAGNOSIS — Z794 Long term (current) use of insulin: Secondary | ICD-10-CM | POA: Diagnosis not present

## 2019-10-05 DIAGNOSIS — K921 Melena: Secondary | ICD-10-CM | POA: Diagnosis present

## 2019-10-05 DIAGNOSIS — E785 Hyperlipidemia, unspecified: Secondary | ICD-10-CM | POA: Diagnosis present

## 2019-10-05 DIAGNOSIS — I160 Hypertensive urgency: Secondary | ICD-10-CM | POA: Diagnosis present

## 2019-10-05 DIAGNOSIS — E1122 Type 2 diabetes mellitus with diabetic chronic kidney disease: Secondary | ICD-10-CM | POA: Diagnosis present

## 2019-10-05 DIAGNOSIS — I129 Hypertensive chronic kidney disease with stage 1 through stage 4 chronic kidney disease, or unspecified chronic kidney disease: Secondary | ICD-10-CM | POA: Diagnosis present

## 2019-10-05 DIAGNOSIS — Z791 Long term (current) use of non-steroidal anti-inflammatories (NSAID): Secondary | ICD-10-CM | POA: Diagnosis not present

## 2019-10-05 DIAGNOSIS — R7881 Bacteremia: Secondary | ICD-10-CM | POA: Diagnosis present

## 2019-10-05 DIAGNOSIS — I2510 Atherosclerotic heart disease of native coronary artery without angina pectoris: Secondary | ICD-10-CM | POA: Diagnosis present

## 2019-10-05 LAB — CBC
HCT: 25.7 % — ABNORMAL LOW (ref 36.0–46.0)
Hemoglobin: 8.1 g/dL — ABNORMAL LOW (ref 12.0–15.0)
MCH: 28 pg (ref 26.0–34.0)
MCHC: 31.5 g/dL (ref 30.0–36.0)
MCV: 88.9 fL (ref 80.0–100.0)
Platelets: 366 10*3/uL (ref 150–400)
RBC: 2.89 MIL/uL — ABNORMAL LOW (ref 3.87–5.11)
RDW: 20.6 % — ABNORMAL HIGH (ref 11.5–15.5)
WBC: 12.7 10*3/uL — ABNORMAL HIGH (ref 4.0–10.5)
nRBC: 0.2 % (ref 0.0–0.2)

## 2019-10-05 LAB — HEMOGLOBIN AND HEMATOCRIT, BLOOD
HCT: 26.1 % — ABNORMAL LOW (ref 36.0–46.0)
Hemoglobin: 8.3 g/dL — ABNORMAL LOW (ref 12.0–15.0)

## 2019-10-05 LAB — URINALYSIS, ROUTINE W REFLEX MICROSCOPIC
Bilirubin Urine: NEGATIVE
Glucose, UA: NEGATIVE mg/dL
Ketones, ur: NEGATIVE mg/dL
Leukocytes,Ua: NEGATIVE
Nitrite: NEGATIVE
Protein, ur: 100 mg/dL — AB
Specific Gravity, Urine: 1.016 (ref 1.005–1.030)
pH: 5 (ref 5.0–8.0)

## 2019-10-05 LAB — COMPREHENSIVE METABOLIC PANEL
ALT: 7 U/L (ref 0–44)
AST: 14 U/L — ABNORMAL LOW (ref 15–41)
Albumin: 2.4 g/dL — ABNORMAL LOW (ref 3.5–5.0)
Alkaline Phosphatase: 73 U/L (ref 38–126)
Anion gap: 12 (ref 5–15)
BUN: 34 mg/dL — ABNORMAL HIGH (ref 8–23)
CO2: 18 mmol/L — ABNORMAL LOW (ref 22–32)
Calcium: 8.4 mg/dL — ABNORMAL LOW (ref 8.9–10.3)
Chloride: 112 mmol/L — ABNORMAL HIGH (ref 98–111)
Creatinine, Ser: 1.54 mg/dL — ABNORMAL HIGH (ref 0.44–1.00)
GFR calc Af Amer: 41 mL/min — ABNORMAL LOW (ref >60)
GFR calc non Af Amer: 35 mL/min — ABNORMAL LOW (ref >60)
Glucose, Bld: 105 mg/dL — ABNORMAL HIGH (ref 70–99)
Potassium: 3.8 mmol/L (ref 3.5–5.1)
Sodium: 142 mmol/L (ref 135–145)
Total Bilirubin: 0.2 mg/dL — ABNORMAL LOW (ref 0.3–1.2)
Total Protein: 6.8 g/dL (ref 6.5–8.1)

## 2019-10-05 LAB — SARS CORONAVIRUS 2 (TAT 6-24 HRS): SARS Coronavirus 2: NEGATIVE

## 2019-10-05 LAB — GLUCOSE, CAPILLARY
Glucose-Capillary: 97 mg/dL (ref 70–99)
Glucose-Capillary: 101 mg/dL — ABNORMAL HIGH (ref 70–99)
Glucose-Capillary: 107 mg/dL — ABNORMAL HIGH (ref 70–99)
Glucose-Capillary: 112 mg/dL — ABNORMAL HIGH (ref 70–99)
Glucose-Capillary: 88 mg/dL (ref 70–99)

## 2019-10-05 MED ORDER — SODIUM CHLORIDE 0.9% FLUSH: 10.0000 mL | INTRAVENOUS | Status: DC | PRN

## 2019-10-05 MED ORDER — CHLORHEXIDINE GLUCONATE CLOTH 2 % EX PADS
6.0000 | MEDICATED_PAD | Freq: Every day | CUTANEOUS | Status: DC
  Administered 2019-10-05 – 2019-10-10 (×5): 6 via TOPICAL

## 2019-10-05 MED ORDER — LABETALOL HCL 200 MG PO TABS
200.0000 mg | ORAL_TABLET | Freq: Three times a day (TID) | ORAL | Status: DC
  Administered 2019-10-05 – 2019-10-10 (×15): 200 mg via ORAL
  Filled 2019-10-05 (×15): qty 1

## 2019-10-05 MED ORDER — INSULIN ASPART 100 UNIT/ML ~~LOC~~ SOLN: 0.0000 [IU] | Freq: Three times a day (TID) | SUBCUTANEOUS | Status: DC

## 2019-10-05 MED ORDER — HYDRALAZINE HCL 25 MG PO TABS
25.0000 mg | ORAL_TABLET | Freq: Three times a day (TID) | ORAL | Status: DC | PRN
  Administered 2019-10-05 – 2019-10-06 (×2): 25 mg via ORAL
  Filled 2019-10-05 (×3): qty 1

## 2019-10-05 MED ORDER — HYDRALAZINE HCL 20 MG/ML IJ SOLN
10.0000 mg | Freq: Once | INTRAMUSCULAR | Status: AC
  Administered 2019-10-05: 10 mg via INTRAVENOUS
  Filled 2019-10-05: qty 1

## 2019-10-05 MED ORDER — SODIUM CHLORIDE 0.9% FLUSH
10.0000 mL | Freq: Two times a day (BID) | INTRAVENOUS | Status: DC
  Administered 2019-10-05 – 2019-10-09 (×7): 10 mL

## 2019-10-05 NOTE — Progress Notes (Signed)
Spoke with Jo-Shuna Rn re PICC placement.  States pt is A&Ox4.  Will notify once able to do PICC exchange.

## 2019-10-05 NOTE — Progress Notes (Signed)
Peripherally Inserted Central Catheter Placement  The IV Nurse has discussed with the patient and/or persons authorized to consent for the patient, the purpose of this procedure and the potential benefits and risks involved with this procedure.  The benefits include less needle sticks, lab draws from the catheter, and the patient may be discharged home with the catheter. Risks include, but not limited to, infection, bleeding, blood clot (thrombus formation), and puncture of an artery; nerve damage and irregular heartbeat and possibility to perform a PICC exchange if needed/ordered by physician.  Alternatives to this procedure were also discussed.  B[facility] Power PICC patient education guide, fact sheet on infection prevention and patient information card has been provided to patient /or left at bedside.    PICC Placement Documentation  PICC Single Lumen 10/05/19 PICC Right Brachial 39 cm 0 cm (Active)  Indication for Insertion or Continuance of Line Home intravenous therapies (PICC only) 10/05/19 1726  Exposed Catheter (cm) 0 cm 10/05/19 1726  Site Assessment Clean;Dry;Intact 10/05/19 1726  Line Status Flushed;Saline locked;Blood return noted 10/05/19 1726  Dressing Type Transparent 10/05/19 1726  Dressing Status Clean;Dry;Intact;Antimicrobial disc in place 10/05/19 Rives checked and tightened 10/05/19 1726  Line Adjustment (NICU/IV Team Only) No 10/05/19 1726  Dressing Intervention New dressing 10/05/19 1726  Dressing Change Due 10/12/19 10/05/19 1726       Rolena Infante 10/05/2019, 5:27 PM

## 2019-10-05 NOTE — Progress Notes (Signed)
Progress Note    Vanessa [name]  BUL:[id number] DOB: [birth year]  DOA: 10/04/2019 PCP: Vanessa Reamer, MD (Inactive)    Brief Narrative:     Medical records reviewed and are as summarized below:  Vanessa [name] is an 64 y.o. female with medical history significant for hypertension, type 2 diabetes mellitus, recent diagnosis endometrial cancer, and MSSA bacteremia currently on IV cefazolin, now presenting from her SNF for evaluation of low hemoglobin.  Patient reports that she is slightly more tired than usual, but denies any lightheadedness or chest pain.  She reports spending most of her days in bed for the past month.  She has not noted any vaginal bleeding since the recent hospital discharge, has not had any fevers or chills, has had dark stool but attributing this to iron, and denies any abdominal pain, nausea, vomiting, or constipation.  Hemoglobin was 7.0 when she was discharged 9 days ago, found to be in the mid 5 range at her SNF today, prompting transfer to the ED.   Assessment/Plan:   Principal Problem:   Symptomatic anemia Active Problems:   DM type 2 (diabetes mellitus, type 2) (HCC)   HTN (hypertension)   CKD (chronic kidney disease), stage III   MSSA bacteremia   Endometrial adenocarcinoma (HCC)   Esophageal dysmotility   Anemia   Symptomatic anemia thought to be due to GI bleeding  but patient does have chronic kidney disease - Presents from SNF with low Hgb, recently had vaginal bleeding and was transfused 3 units earlier this month, now with dark stools on iron, no more vaginal bleeding, FOBT+ - Hgb is 5.7, down from 7.0 on 09/25/19  - 2 units PRBC: Hemoglobin 8.1 - IV PPI Monitor H&H -May need GI consult if continues to have proven blood in stool (does not appear GI was involved upon admission by admitting MD or ER)  MSSA bacteremia   - MSSA grew from cultures on 3/9; 3/11 blood cultures negative  - She had no vegetations on TTE and TEE probe could not  be passed  - No recent fevers  - Continue IV cefazolin with end-date planned for April 22   -PICC line needs to be replaced due to displacement  CKD IIIb - SCr is 1.76 in ED, similar to recent priors  - Renally-dose medications, monitor    Hypertensive urgency  - SBP 190s in ED  - Resume home meds  Esophageal dysmotility - She was placed on dysphagia 3 diet during recent admission -will start clears  Type II DM  - A1c was only 5.7% earlier in March 2021  - Check CBGs and use a low-intensity SSI with Novolog if needed    Endometrial cancer  - Diagnosed earlier this month, bleeding has stopped, she was started on Megace and has GYN-onc referral     obesity Body mass index is 38.92 kg/m.   Family Communication/Anticipated D/C date and plan/Code Status   DVT prophylaxis: SCDs Code Status: Full Code.  Family Communication:  Disposition Plan: Patient needs to remain in the hospital while work-up of her severe anemia is being completed   Medical Consultants:    None.   Subjective:   Patient hungry Denies pain  Objective:    Vitals:   10/05/19 0740 10/05/19 0755 10/05/19 0810 10/05/19 1059  BP: (!) 183/121 (!) 173/101 (!) 191/148 (!) 188/92  Pulse: 87 86 85   Resp: (!) 21 (!) 21 (!) 23   Temp:  TempSrc:      SpO2: 97% 96% 97%   Weight:        Intake/Output Summary (Last 24 hours) at 10/05/2019 1313 Last data filed at 10/05/2019 0758 Gross per 24 hour  Intake 1342 ml  Output 710 ml  Net 632 ml   Filed Weights   10/04/19 2302  Weight: 90.4 kg    Exam: In bed, morbidly obese Poor dentition Ill appearing rrr Diminished breath sounds due to size SCDs on b/l  Data Reviewed:   I have personally reviewed following labs and imaging studies:  Labs: Labs show the following:   Basic Metabolic Panel: Recent Labs  Lab 10/04/19 1806 10/05/19 0829  NA 138 142  K 3.5 3.8  CL 109 112*  CO2 19* 18*  GLUCOSE 129* 105*  BUN 40* 34*   CREATININE 1.76* 1.54*  CALCIUM 8.3* 8.4*   GFR Estimated Creatinine Clearance: 36.5 mL/min (A) (by C-G formula based on SCr of 1.54 mg/dL (H)). Liver Function Tests: Recent Labs  Lab 10/04/19 1806 10/05/19 0829  AST 18 14*  ALT 6 7  ALKPHOS 95 73  BILITOT 0.5 0.2*  PROT 7.1 6.8  ALBUMIN 2.5* 2.4*   No results for input(s): LIPASE, AMYLASE in the last 168 hours. No results for input(s): AMMONIA in the last 168 hours. Coagulation profile No results for input(s): INR, PROTIME in the last 168 hours.  CBC: Recent Labs  Lab 10/04/19 1806 10/05/19 0829  WBC 12.1* 12.7*  HGB 5.7* 8.1*  HCT 19.3* 25.7*  MCV 92.8 88.9  PLT 365 366   Cardiac Enzymes: No results for input(s): CKTOTAL, CKMB, CKMBINDEX, TROPONINI in the last 168 hours. BNP (last 3 results) No results for input(s): PROBNP in the last 8760 hours. CBG: Recent Labs  Lab 10/04/19 2148 10/04/19 2314 10/05/19 0443 10/05/19 0735 10/05/19 1116  GLUCAP 132* 113* 97 101* 107*   D-Dimer: No results for input(s): DDIMER in the last 72 hours. Hgb A1c: No results for input(s): HGBA1C in the last 72 hours. Lipid Profile: No results for input(s): CHOL, HDL, LDLCALC, TRIG, CHOLHDL, LDLDIRECT in the last 72 hours. Thyroid function studies: No results for input(s): TSH, T4TOTAL, T3FREE, THYROIDAB in the last 72 hours.  Invalid input(s): FREET3 Anemia work up: No results for input(s): VITAMINB12, FOLATE, FERRITIN, TIBC, IRON, RETICCTPCT in the last 72 hours. Sepsis Labs: Recent Labs  Lab 10/04/19 1806 10/05/19 0829  WBC 12.1* 12.7*    Microbiology Recent Results (from the past 240 hour(s))  SARS CORONAVIRUS 2 (TAT 6-24 HRS) Nasopharyngeal Nasopharyngeal Swab     Status: None   Collection Time: 10/04/19  8:17 PM   Specimen: Nasopharyngeal Swab  Result Value Ref Range Status   SARS Coronavirus 2 NEGATIVE NEGATIVE Final    Comment: (NOTE) SARS-CoV-2 target nucleic acids are NOT DETECTED. The SARS-CoV-2 RNA is  generally detectable in upper and lower respiratory specimens during the acute phase of infection. Negative results do not preclude SARS-CoV-2 infection, do not rule out co-infections with other pathogens, and should not be used as the sole basis for treatment or other patient management decisions. Negative results must be combined with clinical observations, patient history, and epidemiological information. The expected result is Negative. Fact Sheet for Patients: SugarRoll.be Fact Sheet for Healthcare Providers: https://www.woods-mathews.com/ This test is not yet approved or cleared by the [place] FDA and  has been authorized for detection and/or diagnosis of SARS-CoV-2 by FDA under an Emergency Use Authorization (EUA). This EUA will remain  in  effect (meaning this test can be used) for the duration of the COVID-19 declaration under Section 56 4(b)(1) of the Act, 21 U.S.C. section 360bbb-3(b)(1), unless the authorization is terminated or revoked sooner. Performed at [facility], [place] [address] [place], [place] [address]     Procedures and diagnostic studies:  No results found.  Medications:   . Chlorhexidine Gluconate Cloth  6 each Topical Daily  . insulin aspart  0-6 Units Subcutaneous Q4H  . pantoprazole (PROTONIX) IV  40 mg Intravenous Q12H  . sodium chloride flush  3 mL Intravenous Q12H  . sodium chloride flush  3 mL Intravenous Q12H   Continuous Infusions: . sodium chloride    .  ceFAZolin (ANCEF) IV 2 g (10/05/19 1114)     LOS: 0 days   Geradine Girt  [facility]   How to contact the [facility] Attending or Consulting provider [place] or covering provider during after hours [place], for this patient?  1. Check the care team in [facility] and look for a) attending/consulting [facility] provider listed and b) the [facility] team listed 2. Log into [email or web address] and use 's universal password to access. If you do not  have the password, please contact the hospital operator. 3. Locate the [facility] provider you are looking for under [facility] and page to a number that you can be directly reached. 4. If you still have difficulty reaching the provider, please page the [facility] (Director on Call) for the Hospitalists listed on amion for assistance.  10/05/2019, 1:13 PM

## 2019-10-05 NOTE — Progress Notes (Signed)
RN to notify M.D. PICC line out approx. 12 cm.: I.V. team recommends Exchange or replace line.

## 2019-10-06 LAB — BPAM RBC
Blood Product Expiration Date: 202104262359
Blood Product Expiration Date: 202104262359
ISSUE DATE / TIME: 202103262201
ISSUE DATE / TIME: 202103270203
Unit Type and Rh: 5100
Unit Type and Rh: 5100

## 2019-10-06 LAB — CBC
HCT: 24 % — ABNORMAL LOW (ref 36.0–46.0)
Hemoglobin: 7.4 g/dL — ABNORMAL LOW (ref 12.0–15.0)
MCH: 28.1 pg (ref 26.0–34.0)
MCHC: 30.8 g/dL (ref 30.0–36.0)
MCV: 91.3 fL (ref 80.0–100.0)
Platelets: 326 10*3/uL (ref 150–400)
RBC: 2.63 MIL/uL — ABNORMAL LOW (ref 3.87–5.11)
RDW: 21.6 % — ABNORMAL HIGH (ref 11.5–15.5)
WBC: 14.3 10*3/uL — ABNORMAL HIGH (ref 4.0–10.5)
nRBC: 0 % (ref 0.0–0.2)

## 2019-10-06 LAB — TYPE AND SCREEN
ABO/RH(D): O POS
Antibody Screen: NEGATIVE
Unit division: 0
Unit division: 0

## 2019-10-06 LAB — GLUCOSE, CAPILLARY
Glucose-Capillary: 100 mg/dL — ABNORMAL HIGH (ref 70–99)
Glucose-Capillary: 102 mg/dL — ABNORMAL HIGH (ref 70–99)
Glucose-Capillary: 126 mg/dL — ABNORMAL HIGH (ref 70–99)
Glucose-Capillary: 148 mg/dL — ABNORMAL HIGH (ref 70–99)
Glucose-Capillary: 91 mg/dL (ref 70–99)

## 2019-10-06 LAB — BASIC METABOLIC PANEL
Anion gap: 9 (ref 5–15)
BUN: 28 mg/dL — ABNORMAL HIGH (ref 8–23)
CO2: 21 mmol/L — ABNORMAL LOW (ref 22–32)
Calcium: 8.5 mg/dL — ABNORMAL LOW (ref 8.9–10.3)
Chloride: 113 mmol/L — ABNORMAL HIGH (ref 98–111)
Creatinine, Ser: 1.46 mg/dL — ABNORMAL HIGH (ref 0.44–1.00)
GFR calc Af Amer: 43 mL/min — ABNORMAL LOW (ref >60)
GFR calc non Af Amer: 37 mL/min — ABNORMAL LOW (ref >60)
Glucose, Bld: 111 mg/dL — ABNORMAL HIGH (ref 70–99)
Potassium: 3.9 mmol/L (ref 3.5–5.1)
Sodium: 143 mmol/L (ref 135–145)

## 2019-10-06 MED ORDER — DIPHENHYDRAMINE HCL 25 MG PO CAPS
25.0000 mg | ORAL_CAPSULE | Freq: Four times a day (QID) | ORAL | Status: DC | PRN
  Administered 2019-10-06 – 2019-10-09 (×5): 25 mg via ORAL
  Filled 2019-10-06 (×6): qty 1

## 2019-10-06 MED ORDER — CEFAZOLIN SODIUM-DEXTROSE 2-4 GM/100ML-% IV SOLN
2.0000 g | Freq: Three times a day (TID) | INTRAVENOUS | Status: DC
  Administered 2019-10-06 – 2019-10-10 (×12): 2 g via INTRAVENOUS
  Filled 2019-10-06 (×14): qty 100

## 2019-10-06 MED ORDER — AMLODIPINE BESYLATE 10 MG PO TABS
10.0000 mg | ORAL_TABLET | Freq: Every day | ORAL | Status: DC
  Administered 2019-10-06 – 2019-10-10 (×5): 10 mg via ORAL
  Filled 2019-10-06 (×5): qty 1

## 2019-10-06 NOTE — Progress Notes (Addendum)
PROGRESS NOTE    Vanessa [name]  TZG:[id number] DOB: 1955/11/23 DOA: 10/04/2019 PCP: Maren Reamer, MD (Inactive)   Brief Narrative: 64 y.o. female with medical history significant forhypertension, type 2 diabetes mellitus, recent diagnosis endometrial cancer, and MSSA bacteremia currently on IV cefazolin, now presenting from her SNF for evaluation of low hemoglobin. Patient reports that she is slightly more tired than usual, but denies any lightheadedness or chest pain. She reports spending most of her days in bed for the past month.She has not noted any vaginal bleeding since the recent hospital discharge, has not had any fevers or chills, has had dark stool but attributing this to iron, and denies any abdominal pain, nausea, vomiting, or constipation. Hemoglobin was 7.0 when she was discharged 9 days ago, found to be in the mid 5 range at her SNF   Subjective:  No new complaints Overnight afebrile, T-max 98.1, blood pressure remains poorly controlled 160s to 200s. hb improved to 8.3 g. BP in 759 systolic  Assessment & Plan:  Symptomatic anemia: Multifactorial.  FOBT positive/possible GI bleed also with CKD and on last recent admission had vaginal bleeding when she received 3 units PRBC and was sent to SNF.  No more vaginal bleeding now.  Status post 2 units PRBC.  Trend hemoglobin.  Given FOBT positive will benefit with GI consultation.  Continue Protonix 40 mg twice daily. she was seen on Dr. Paulita Fujita on last admission for dysphagia eval and advised  EGD outpatient versus inpatient.  Patient was advised to have dental evaluation. Recent Labs  Lab 10/04/19 1806 10/05/19 0829 10/05/19 1750  HGB 5.7* 8.1* 8.3*  HCT 19.3* 25.7* 26.1*   DM type 2/HLD: Last A1c 5.7, sugar stable continue current regimen.  Continue statin.  Monitor blood sugar. Recent Labs  Lab 10/05/19 1116 10/05/19 1742 10/05/19 2115 10/06/19 0031 10/06/19 0813  GLUCAP 107* 112* 88 91 100*   HTN Urgency:  Blood pressure remains poorly controlled-labetalol 200 mg 3 times daily.  On last admission patient was on 10 mg of amlodipine but it appears it was not continued on discharge- I will initiate back on amlodipine 10 mg and monitor.  Dental disease orthopantogram 3/14- showed significant dental disease with areas of periapical lucency "Multiple missing teeth. Periapical lucency adjacent to the root of the remaining left mandibular lateral incisor. Periapical lucency adjacent to the left maxillary incisor and left maxillary second molar. Significant resorption of the left maxillary first molar and a portion of a residual last right maxillary molar"  she was  supposed to have a follow-up on March 25 w dentist. She reports she saw dentist and had xray and reports "They could not do anything without insurance"  CKD (chronic kidney disease), stage IIIb-remains a stable. Recent Labs  Lab 10/04/19 1806 10/05/19 0829  BUN 40* 34*  CREATININE 1.76* 1.54*   MSSA bacteremia: MSSA grew from cultures on 3/9; 3/11 blood cultures negative,no vegetations on TTE and TEE probe could not be passed.  Recent fever and she remains on cefazolin end date April 22.  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  Was placed on dysphagia 3 diet on recent admission.  Morbid obesity with Body mass index is 39.27 kg/m. Wt loss will benefit  Nutrition: Diet Order  Diet full liquid Room service appropriate? Yes; Fluid consistency: Thin  Diet effective now             Pressure Ulcer: Pressure Injury 09/22/19  Medial Stage 2 -  Partial thickness loss of dermis presenting as a shallow open injury with a red, pink wound bed without slough. (Active)    09/22/19 0341  Location: -- (gluteal cleft)  Location Orientation: Medial  Staging: Stage 2 -  Partial thickness loss of dermis presenting as a shallow open injury with a red, pink wound bed without slough.  Wound Description (Comments):   Present on Admission:     DVT prophylaxis:SCD- no Heparin/lovenox 2/2 anemia Code Status: full  Family Communication: plan of care discussed with patient at bedside. Disposition Plan: Patient is from:SNF Anticipated Disposition: to SNF,in TBD Barriers to discharge or conditions that needs to be met prior to discharge: Patient admitted with severe anemia needing blood transfusion, also with having severe uncontrolled hypertension medication being adjusted.  She remains hospitalized for further monitoring of hemoglobin and possible GI evaluation.  Consultants:see note  Procedures:see note Microbiology:see note   Medications: Scheduled Meds: . amLODipine  10 mg Oral Daily  . Chlorhexidine Gluconate Cloth  6 each Topical Daily  . insulin aspart  0-6 Units Subcutaneous TID WC  . labetalol  200 mg Oral TID  . pantoprazole (PROTONIX) IV  40 mg Intravenous Q12H  . sodium chloride flush  10-40 mL Intracatheter Q12H  . sodium chloride flush  3 mL Intravenous Q12H  . sodium chloride flush  3 mL Intravenous Q12H   Continuous Infusions: . sodium chloride    .  ceFAZolin (ANCEF) IV 2 g (10/06/19 0949)    Antimicrobials: Anti-infectives (From admission, onward)   Start     Dose/Rate Route Frequency Ordered Stop   10/04/19 2200  ceFAZolin (ANCEF) IVPB 2g/100 mL premix     2 g 200 mL/hr over 30 Minutes Intravenous Every 12 hours 10/04/19 2017         Objective: Vitals: Today's Vitals   10/06/19 0020 10/06/19 0500 10/06/19 0555 10/06/19 0655  BP: (!) 183/85 (!) 196/90    Pulse: 81 86    Resp: (!) 0 18    Temp:  98.1 F (36.7 C)    TempSrc:  Oral    SpO2: 96% 96%    Weight:  91.2 kg    PainSc:   6  2     Intake/Output Summary (Last 24  hours) at 10/06/2019 1018 Last data filed at 10/06/2019 0500 Gross per 24 hour  Intake 200 ml  Output 150 ml  Net 50 ml   Filed Weights   10/04/19 2302 10/06/19 0500  Weight: 90.4 kg 91.2 kg   Weight change: 0.8 kg   Intake/Output from previous day: 03/27 0701 - 03/28 0700 In: 200 [IV Piggyback:200] Out: 860 [Urine:860] Intake/Output this shift: No intake/output data recorded.  Examination:  General exam: AAO ,NAD, weak appearing. HEENT:Oral mucosa moist, Ear/Nose WNL grossly,dentition normal.  Teeth looks abnormal. Respiratory system: bilaterally clear,no wheezing or crackles,no use of accessory muscle, non tender. Cardiovascular system: S1 & S2 +, regular, No JVD. Gastrointestinal system: Abdomen soft, NT,ND, BS+. Nervous System:Alert, awake, moving extremities and grossly nonfocal Extremities: No edema, distal peripheral pulses palpable.  Skin: No rashes,no icterus. MSK: Normal muscle bulk,tone, power  Data Reviewed: I have personally reviewed following labs and imaging studies CBC: Recent Labs  Lab 10/04/19 1806 10/05/19 0829 10/05/19 1750  WBC 12.1* 12.7*  --  HGB 5.7* 8.1* 8.3*  HCT 19.3* 25.7* 26.1*  MCV 92.8 88.9  --   PLT 365 366  --    Basic Metabolic Panel: Recent Labs  Lab 10/04/19 1806 10/05/19 0829  NA 138 142  K 3.5 3.8  CL 109 112*  CO2 19* 18*  GLUCOSE 129* 105*  BUN 40* 34*  CREATININE 1.76* 1.54*  CALCIUM 8.3* 8.4*   GFR: Estimated Creatinine Clearance: 36.7 mL/min (A) (by C-G formula based on SCr of 1.54 mg/dL (H)). Liver Function Tests: Recent Labs  Lab 10/04/19 1806 10/05/19 0829  AST 18 14*  ALT 6 7  ALKPHOS 95 73  BILITOT 0.5 0.2*  PROT 7.1 6.8  ALBUMIN 2.5* 2.4*   No results for input(s): LIPASE, AMYLASE in the last 168 hours. No results for input(s): AMMONIA in the last 168 hours. Coagulation Profile: No results for input(s): INR, PROTIME in the last 168 hours. Cardiac Enzymes: No results for input(s): CKTOTAL,  CKMB, CKMBINDEX, TROPONINI in the last 168 hours. BNP (last 3 results) No results for input(s): PROBNP in the last 8760 hours. HbA1C: No results for input(s): HGBA1C in the last 72 hours. CBG: Recent Labs  Lab 10/05/19 1116 10/05/19 1742 10/05/19 2115 10/06/19 0031 10/06/19 0813  GLUCAP 107* 112* 88 91 100*   Lipid Profile: No results for input(s): CHOL, HDL, LDLCALC, TRIG, CHOLHDL, LDLDIRECT in the last 72 hours. Thyroid Function Tests: No results for input(s): TSH, T4TOTAL, FREET4, T3FREE, THYROIDAB in the last 72 hours. Anemia Panel: No results for input(s): VITAMINB12, FOLATE, FERRITIN, TIBC, IRON, RETICCTPCT in the last 72 hours. Sepsis Labs: No results for input(s): PROCALCITON, LATICACIDVEN in the last 168 hours.  Recent Results (from the past 240 hour(s))  SARS CORONAVIRUS 2 (TAT 6-24 HRS) Nasopharyngeal Nasopharyngeal Swab     Status: None   Collection Time: 10/04/19  8:17 PM   Specimen: Nasopharyngeal Swab  Result Value Ref Range Status   SARS Coronavirus 2 NEGATIVE NEGATIVE Final    Comment: (NOTE) SARS-CoV-2 target nucleic acids are NOT DETECTED. The SARS-CoV-2 RNA is generally detectable in upper and lower respiratory specimens during the acute phase of infection. Negative results do not preclude SARS-CoV-2 infection, do not rule out co-infections with other pathogens, and should not be used as the sole basis for treatment or other patient management decisions. Negative results must be combined with clinical observations, patient history, and epidemiological information. The expected result is Negative. Fact Sheet for Patients: SugarRoll.be Fact Sheet for Healthcare Providers: https://www.woods-mathews.com/ This test is not yet approved or cleared by the [place] FDA and  has been authorized for detection and/or diagnosis of SARS-CoV-2 by FDA under an Emergency Use Authorization (EUA). This EUA will remain  in  effect (meaning this test can be used) for the duration of the COVID-19 declaration under Section 56 4(b)(1) of the Act, 21 U.S.C. section 360bbb-3(b)(1), unless the authorization is terminated or revoked sooner. Performed at [facility], [place] [address], [place], [place] [address]       Radiology Studies: [place] EKG SITE RITE  Result Date: 10/05/2019 If [facility] image not attached, placement could not be confirmed due to current cardiac rhythm.    LOS: 1 day   Time spent: More than 50% of that time was spent in counseling and/or coordination of care.  Antonieta Pert, MD [facility]  10/06/2019, 10:18 AM

## 2019-10-07 ENCOUNTER — Inpatient Hospital Stay: Payer: Medicare Other | Admitting: Gynecologic Oncology

## 2019-10-07 LAB — CBC
HCT: 25 % — ABNORMAL LOW (ref 36.0–46.0)
Hemoglobin: 7.6 g/dL — ABNORMAL LOW (ref 12.0–15.0)
MCH: 28 pg (ref 26.0–34.0)
MCHC: 30.4 g/dL (ref 30.0–36.0)
MCV: 92.3 fL (ref 80.0–100.0)
Platelets: 350 10*3/uL (ref 150–400)
RBC: 2.71 MIL/uL — ABNORMAL LOW (ref 3.87–5.11)
RDW: 21.5 % — ABNORMAL HIGH (ref 11.5–15.5)
WBC: 11.6 10*3/uL — ABNORMAL HIGH (ref 4.0–10.5)
nRBC: 0 % (ref 0.0–0.2)

## 2019-10-07 LAB — GLUCOSE, CAPILLARY
Glucose-Capillary: 112 mg/dL — ABNORMAL HIGH (ref 70–99)
Glucose-Capillary: 114 mg/dL — ABNORMAL HIGH (ref 70–99)
Glucose-Capillary: 114 mg/dL — ABNORMAL HIGH (ref 70–99)
Glucose-Capillary: 121 mg/dL — ABNORMAL HIGH (ref 70–99)
Glucose-Capillary: 127 mg/dL — ABNORMAL HIGH (ref 70–99)
Glucose-Capillary: 128 mg/dL — ABNORMAL HIGH (ref 70–99)
Glucose-Capillary: 138 mg/dL — ABNORMAL HIGH (ref 70–99)

## 2019-10-07 LAB — BASIC METABOLIC PANEL
Anion gap: 11 (ref 5–15)
BUN: 27 mg/dL — ABNORMAL HIGH (ref 8–23)
CO2: 20 mmol/L — ABNORMAL LOW (ref 22–32)
Calcium: 8.3 mg/dL — ABNORMAL LOW (ref 8.9–10.3)
Chloride: 112 mmol/L — ABNORMAL HIGH (ref 98–111)
Creatinine, Ser: 1.53 mg/dL — ABNORMAL HIGH (ref 0.44–1.00)
GFR calc Af Amer: 41 mL/min — ABNORMAL LOW (ref >60)
GFR calc non Af Amer: 35 mL/min — ABNORMAL LOW (ref >60)
Glucose, Bld: 116 mg/dL — ABNORMAL HIGH (ref 70–99)
Potassium: 3.9 mmol/L (ref 3.5–5.1)
Sodium: 143 mmol/L (ref 135–145)

## 2019-10-07 LAB — MRSA PCR SCREENING: MRSA by PCR: NEGATIVE

## 2019-10-07 NOTE — Plan of Care (Signed)
  Problem: Education: Goal: Ability to identify signs and symptoms of gastrointestinal bleeding will improve Outcome: Progressing   Problem: Fluid Volume: Goal: Will show no signs and symptoms of excessive bleeding Outcome: Progressing

## 2019-10-07 NOTE — Evaluation (Signed)
Physical Therapy Evaluation Patient Details Name: Vanessa [name] MRN: [id number] DOB: 1955-12-10 Today's Date: 10/07/2019   History of Present Illness  Pt is 64 y.o. female with medical history significant for hypertension, type 2 diabetes mellitus, recent diagnosis endometrial cancer, and MSSA.  Pt with recent hospital admission with vaginal bleeding and now with possible GIB.  Additionally pt with L humeral fx with callus formation consistent with healing - per last admission ok to use functionally and WBAT.  Clinical Impression  Pt admitted with above diagnosis. Pt requiring min A for transfers and min guard to ambulate 150' with RW.  Did required increased cues for safety.  Pt is questionable historian, and unsure if 24 hr support available at home.  Recommend SNF to continue to advance mobility and safety at d/c as pt is fall risk.  Pt currently with functional limitations due to the deficits listed below (see PT Problem List). Pt will benefit from skilled PT to increase their independence and safety with mobility to allow discharge to the venue listed below.       Follow Up Recommendations SNF;Supervision/Assistance - 24 hour    Equipment Recommendations  Rolling walker with 5" wheels    Recommendations for Other Services       Precautions / Restrictions Precautions Precautions: Fall Shoulder Interventions: Shoulder sling/immobilizer;For comfort Precaution Comments: L proximal humerus fx healing (not acute)- per admission 09/20/19 pt can use arm functionally and WBAT, sling for comfort Restrictions LUE Weight Bearing: Weight bearing as tolerated      Mobility  Bed Mobility Overal bed mobility: Needs Assistance Bed Mobility: Sit to Supine;Supine to Sit     Supine to sit: Min assist Sit to supine: Min assist   General bed mobility comments: Required assist for trunk OOB and legs back into bed; cues for use of bedrail  Transfers Overall transfer level: Needs  assistance Equipment used: Rolling walker (2 wheeled) Transfers: Sit to/from Stand Sit to Stand: Min assist         General transfer comment: VCs for sequencing and safety when standing  Ambulation/Gait Ambulation/Gait assistance: Min guard Gait Distance (Feet): 150 Feet Assistive device: Rolling walker (2 wheeled) Gait Pattern/deviations: Step-through pattern;Decreased stride length;Drifts right/left Gait velocity: decreased   General Gait Details: Required cues to avoid objects on both sides and for RW proximity  Stairs            Wheelchair Mobility    Modified Rankin (Stroke Patients Only)       Balance Overall balance assessment: Needs assistance Sitting-balance support: Feet supported Sitting balance-Leahy Scale: Good     Standing balance support: Single extremity supported;During functional activity Standing balance-Leahy Scale: Fair Standing balance comment: Pt relient on single UE support during ADLs                             Pertinent Vitals/Pain Pain Assessment: No/denies pain    Home Living Family/patient expects to be discharged to:: Skilled nursing facility Living Arrangements: Spouse/significant other Available Help at Discharge: Family;Available PRN/intermittently Type of Home: Apartment Home Access: Level entry     Home Layout: One level Home Equipment: Other (comment)(denies DME)      Prior Function Level of Independence: Needs assistance   Gait / Transfers Assistance Needed: Reports household ambulation; reports "furniture walking"; reports fell OOB when asleep but denies other falls  ADL's / Homemaking Assistance Needed: Reports assist with bathing, but indepedent with toileting and dressing but with  difficulty  Comments: limited/questionable historian.     Hand Dominance        Extremity/Trunk Assessment   Upper Extremity Assessment Upper Extremity Assessment: LUE deficits/detail;RUE deficits/detail RUE  Deficits / Details: ROM WFL: MMT 4/5 LUE Deficits / Details: ROM WFL except shoulder with limited elevation due to recent fracture;  MMT elbow and hand 3/5    Lower Extremity Assessment Lower Extremity Assessment: LLE deficits/detail;RLE deficits/detail RLE Deficits / Details: ROM WFL/ MMT 4+/5 LLE Deficits / Details: ROM WFL/ MMT 4+/5       Communication   Communication: No difficulties  Cognition Arousal/Alertness: Awake/alert   Overall Cognitive Status: No family/caregiver present to determine baseline cognitive functioning Area of Impairment: Awareness;Safety/judgement                               General Comments: oriented x 4      General Comments General comments (skin integrity, edema, etc.): VSS; no reports of dizziness or lightheadiness    Exercises     Assessment/Plan    PT Assessment Patient needs continued PT services  PT Problem List Decreased strength;Decreased activity tolerance;Decreased mobility;Decreased knowledge of use of DME;Pain;Decreased balance;Decreased range of motion       PT Treatment Interventions Gait training;Functional mobility training;Therapeutic activities;Balance training;Patient/family education;DME instruction;Therapeutic exercise    PT Goals (Current goals can be found in the Care Plan section)  Acute Rehab PT Goals Patient Stated Goal: return to SNF at d/c to get stronger PT Goal Formulation: With patient Time For Goal Achievement: 10/21/19 Potential to Achieve Goals: Good    Frequency Min 2X/week   Barriers to discharge Decreased caregiver support      Co-evaluation               AM-PAC PT "6 Clicks" Mobility  Outcome Measure Help needed turning from your back to your side while in a flat bed without using bedrails?: A Little Help needed moving from lying on your back to sitting on the side of a flat bed without using bedrails?: A Little Help needed moving to and from a bed to a chair (including a  wheelchair)?: A Little Help needed standing up from a chair using your arms (e.g., wheelchair or bedside chair)?: A Little Help needed to walk in hospital room?: A Little Help needed climbing 3-5 steps with a railing? : A Lot 6 Click Score: 17    End of Session Equipment Utilized During Treatment: Gait belt Activity Tolerance: Patient tolerated treatment well Patient left: with call bell/phone within reach;in bed;with bed alarm set Nurse Communication: Mobility status PT Visit Diagnosis: Unsteadiness on feet (R26.81);Muscle weakness (generalized) (M62.81)    Time: 5102-5852 PT Time Calculation (min) (ACUTE ONLY): 25 min   Charges:   PT Evaluation $PT Eval Low Complexity: 1 Low PT Treatments $Gait Training: 8-22 mins        Maggie Font, PT Acute Rehab Services Pager [phone] [facility] [phone] [facility] [phone]   Karlton Lemon 10/07/2019, 5:04 PM

## 2019-10-07 NOTE — TOC Initial Note (Addendum)
Transition of Care [facility]) - Initial/Assessment Note    Patient Details  Name: Vanessa [name] MRN: [id number] Date of Birth: 07/17/1955  Transition of Care [facility] CM/SW Contact:    Jacquelynn Cree Phone Number: 10/07/2019, 3:24 PM  Clinical Narrative:                 CSW met bedside with patient. Patient confirmed she was admitted from [facility]. Patient was at facility for 7 days prior to current admission.  Patient stated she is in agreement to return to SNF once medically stable for discharge. CSW asked patient if there is anyone she would like contacted, patient declined to provide anyone. No further questions expressed at this time. CSW will continue to follow and assist with discharge planning needs.  Expected Discharge Plan: Skilled Nursing Facility Barriers to Discharge: Continued Medical Work up, Ship broker   Patient Goals and CMS Choice   CMS Medicare.gov Compare Post Acute Care list provided to:: Patient Choice offered to / list presented to : Patient  Expected Discharge Plan and Services Expected Discharge Plan: [place] arrangements for the past 2 months: Apartment                                      Prior Living Arrangements/Services Living arrangements for the past 2 months: Apartment Lives with:: Spouse Patient language and need for interpreter reviewed:: Yes Do you feel safe going back to the place where you live?: Yes      Need for Family Participation in Patient Care: No (Comment) Care giver support system in place?: Yes (comment)   Criminal Activity/Legal Involvement Pertinent to Current Situation/Hospitalization: No - Comment as needed  Activities of Daily Living      Permission Sought/Granted Permission sought to share information with : Facility Art therapist granted to share information with : Yes, Verbal Permission Granted     Permission granted to share info w  AGENCY: SNF        Emotional Assessment   Attitude/Demeanor/Rapport: Unable to Assess Affect (typically observed): Unable to Assess Orientation: : Oriented to Self, Oriented to Place, Oriented to  Time, Oriented to Situation Alcohol / Substance Use: Not Applicable Psych Involvement: No (comment)  Admission diagnosis:  Symptomatic anemia [D64.9] Anemia [D64.9] Patient Active Problem List   Diagnosis Date Noted  . Anemia 10/05/2019  . Esophageal dysmotility 10/04/2019  . Pressure injury of skin 09/22/2019  . Endometrial adenocarcinoma ([place]) 09/21/2019  . Left humeral fracture 09/21/2019  . Iron deficiency anemia due to chronic blood loss 09/21/2019  . MSSA bacteremia 09/18/2019  . Postmenopausal bleeding 09/15/2019  . Thrombocytopenia ([place]) 09/15/2019  . Generalized pruritus 09/15/2019  . CKD (chronic kidney disease), stage III 09/15/2019  . Hyperuricemia 09/15/2019  . Hypokalemia 09/15/2019  . Symptomatic anemia 09/14/2019  . Non compliance w medication regimen 03/24/2015  . Dyslipidemia 03/24/2015  . DM type 2 (diabetes mellitus, type 2) ([place]) 05/23/2014  . HTN (hypertension) 05/23/2014   PCP:  Maren Reamer, MD (Inactive) Pharmacy:   [facility] [address] [place]), [place] - [address] [place] [address] Phone: [phone] Fax: [place], [address] [place] [address] Phone: [phone] Fax: [phone]  [id number] [place]  [place] [address] Phone: [phone] Fax: [phone]     Social Determinants of Health (SDOH) Interventions    Readmission Risk Interventions Readmission Risk Prevention Plan 09/25/2019  Transportation Screening Complete  PCP or Specialist Appt within 3-5 Days Not Complete  Not Complete comments plan for SNF  HRI or [place] Complete   Social Work Consult for [place] Planning/Counseling Complete  Palliative Care Screening Not Applicable  Medication Review (RN Care Manager) Referral to Pharmacy  Some recent data might be hidden

## 2019-10-07 NOTE — Progress Notes (Signed)
Pharmacy Antibiotic Note  Vanessa [name] is a 64 y.o. female admitted on 10/04/2019 with MSSA bacteremia. Pharmacy has been consulted for cefazolin dosing. Cefazolin started during recent admission with end-date 4/22 per ID. Cr has improved since recent admit discharge (~1.77) to 1.5 today.  Plan: -Increase cefazolin to 2g IV q8h  Weight: 200 lb 6.4 oz (90.9 kg)  Temp (24hrs), Avg:98.2 F (36.8 C), Min:97.8 F (36.6 C), Max:98.6 F (37 C)  Recent Labs  Lab 10/04/19 1806 10/05/19 0829 10/06/19 1100 10/07/19 0603  WBC 12.1* 12.7* 14.3* 11.6*  CREATININE 1.76* 1.54* 1.46* 1.53*    Estimated Creatinine Clearance: 36.9 mL/min (A) (by C-G formula based on SCr of 1.53 mg/dL (H)).    No Known Allergies  Antimicrobials this admission: 3/26 cefazolin >>   Dose adjustments this admission: N/A  Microbiology results: N/A  Thank you for allowing pharmacy to be a part of this patient's care.  Arrie Senate, PharmD, BCPS Clinical Pharmacist [phone] Please check AMION for all [place] numbers 10/07/2019

## 2019-10-07 NOTE — Consult Note (Signed)
Referring Provider: Dr. Maren Beach Primary Care Physician:  Maren Reamer, MD (Inactive) Primary Gastroenterologist: Althia Forts  Reason for Consultation: Dysphagia, anemia, melena, heme positive stools  HPI: Vanessa [name] is a 64 y.o. female with past medical history of DM type II, CKD stage III, endometrial cancer (recently diagnosed), and MSSA bacteremia (on IV cefazolin) presenting with dysphagia and anemia in the setting of melena and heme positive stool.  Patient was recently hospitalized from 3/6-3/17/21 at which time she was having vaginal bleeding and was diagnosed with endometrial cancer.  Her vaginal bleeding has resolved, but she continues to have anemia.  She presented to the ED on 3/26 with increasing fatigue and weakness.  Her hemoglobin at the time was 5.7, and she was fecal occult positive.    Patient endorses chronic black stools, but she attributes this to iron use.  She typically has 2 formed bowel movements per day.  She has not noted any bright red blood in the stool.  She notes dysphagia, ongoing for several months.  When she does not chop food, she notes a choking sensation where food feels stuck.  She is able to swallow soft foods, finely chopped foods, and liquids.  She endorses a good appetite but states she has had recent unintentional weight loss of approximately 10 pounds.  Patient denies nausea, vomiting, heartburn, abdominal pain, or changes in stools.  She denies blood thinners or NSAID use.  She denies alcohol use.  She has never had an EGD or colonoscopy.  She denies any family history of gastrointestinal malignancies.  Past Medical History:  Diagnosis Date  . Asthma   . Diabetes mellitus without complication ([place])   . Endometrial cancer ([place])   . Heart murmur   . Hypertension   . MSSA bacteremia 09/2019    Past Surgical History:  Procedure Laterality Date  . TYMPANOSTOMY TUBE PLACEMENT Bilateral     Prior to Admission medications   Medication Sig  Start Date End Date Taking? Authorizing Provider  acetaminophen (TYLENOL) 500 MG tablet Take 500 mg by mouth every 6 (six) hours as needed for moderate pain.   Yes [provider]  benzocaine (ORAJEL) 10 % mucosal gel Use as directed 1 application in the mouth or throat 4 (four) times daily as needed for mouth pain. 09/25/19  Yes Arrien, Jimmy Picket, MD  Blood Glucose Monitoring Suppl (BAYER CONTOUR MONITOR) w/Device KIT Use as directed for 3 times daily testing of blood glucose. E11.9 12/01/16  Yes Langeland, Dawn T, MD  ceFAZolin (ANCEF) IVPB Inject 2 g into the vein every 12 (twelve) hours. Indication:  MSSA Bacteremia Last Day of Therapy:  10/31/2019 Labs - Once weekly:  CBC/D and BMP, Labs - Every other week:  ESR and CRP 09/25/19 11/02/19 Yes Arrien, Jimmy Picket, MD  diclofenac Sodium (VOLTAREN) 1 % GEL Apply 2 g topically 4 (four) times daily. Apply to left shoulder. 09/25/19  Yes Arrien, Jimmy Picket, MD  ferrous sulfate 325 (65 FE) MG tablet Take 1 tablet (325 mg total) by mouth 2 (two) times daily with a meal. 09/25/19 10/25/19 Yes Arrien, Jimmy Picket, MD  glucose blood (BAYER CONTOUR TEST) test strip Use as instructed for 3 times daily testing of blood glucose. E11.9 12/01/16  Yes Maren Reamer, MD  INSULIN SYRINGE 1CC/29G 29G X 1/2" 1 ML MISC Insulin syringes 03/06/14  Yes Rolene Course, PA-C  labetalol (NORMODYNE) 200 MG tablet Take 1 tablet (200 mg total) by mouth 3 (three) times daily. 09/25/19 10/25/19 Yes  Arrien, Jimmy Picket, MD  Lancets [facility] ULTRASOFT) lancets Use as instructed 11/29/16  Yes Maren Reamer, MD  megestrol (MEGACE) 40 MG tablet Take 2 tablets (80 mg total) by mouth 2 (two) times daily. 09/25/19 10/25/19 Yes Arrien, Jimmy Picket, MD  oxyCODONE (OXY IR/ROXICODONE) 5 MG immediate release tablet Take 1 tablet (5 mg total) by mouth every 6 (six) hours as needed for moderate pain. 09/25/19  Yes Arrien, Jimmy Picket, MD  polyethylene glycol  (MIRALAX / GLYCOLAX) 17 g packet Take 17 g by mouth daily. 09/25/19  Yes Arrien, Jimmy Picket, MD    Scheduled Meds: . amLODipine  10 mg Oral Daily  . Chlorhexidine Gluconate Cloth  6 each Topical Daily  . insulin aspart  0-6 Units Subcutaneous TID WC  . labetalol  200 mg Oral TID  . pantoprazole (PROTONIX) IV  40 mg Intravenous Q12H  . sodium chloride flush  10-40 mL Intracatheter Q12H   Continuous Infusions: .  ceFAZolin (ANCEF) IV 2 g (10/07/19 0542)   PRN Meds:.acetaminophen **OR** acetaminophen, benzocaine, diphenhydrAMINE, fentaNYL (SUBLIMAZE) injection, hydrALAZINE, labetalol, ondansetron **OR** ondansetron (ZOFRAN) IV, sodium chloride flush  Allergies as of 10/04/2019  . (No Known Allergies)    Family History  Problem Relation Age of Onset  . Stroke Mother   . Diabetes Mother   . Hypertension Mother   . Diabetes Brother   . Hypertension Brother     Social History   Socioeconomic History  . Marital status: Married    Spouse name: Not on file  . Number of children: Not on file  . Years of education: Not on file  . Highest education level: Not on file  Occupational History  . Not on file  Tobacco Use  . Smoking status: Never Smoker  . Smokeless tobacco: Never Used  Substance and Sexual Activity  . Alcohol use: No  . Drug use: No  . Sexual activity: Never  Other Topics Concern  . Not on file  Social History Narrative  . Not on file   Social Determinants of Health   Financial Resource Strain:   . Difficulty of Paying Living Expenses:   Food Insecurity:   . Worried About Charity fundraiser in the Last Year:   . Arboriculturist in the Last Year:   Transportation Needs:   . Film/video editor (Medical):   Marland Kitchen Lack of Transportation (Non-Medical):   Physical Activity:   . Days of Exercise per Week:   . Minutes of Exercise per Session:   Stress:   . Feeling of Stress :   Social Connections:   . Frequency of Communication with Friends and Family:    . Frequency of Social Gatherings with Friends and Family:   . Attends Religious Services:   . Active Member of Clubs or Organizations:   . Attends Archivist Meetings:   Marland Kitchen Marital Status:   Intimate Partner Violence:   . Fear of Current or Ex-Partner:   . Emotionally Abused:   Marland Kitchen Physically Abused:   . Sexually Abused:     Review of Systems: All negative except as stated above in HPI.  Physical Exam: Vital signs: Vitals:   10/07/19 0817 10/07/19 0852  BP: (!) 186/85 (!) 150/92  Pulse: 71 80  Resp:    Temp: 97.8 F (36.6 C)   SpO2: 98%    Last BM Date: 10/06/19 General:   Alert, obese, pleasant and cooperative in NAD Head: normocephalic, atraumatic Eyes: anicteric sclera; conjunctival pallor  ENT: oropharynx clear; poor dentition with loose and missing teeth Lungs:  Clear throughout to auscultation.   No wheezes, crackles, or rhonchi. No acute distress. Heart:  Regular rate and rhythm; no murmurs, clicks, rubs,  or gallops. Abdomen: Soft, nontender, nondistended.  Normal active bowel sounds.  No guarding or peritoneal signs. Rectal:  Deferred Ext: no edema Neuro: alert and oriented  GI:  Lab Results: Recent Labs    10/05/19 0829 10/05/19 0829 10/05/19 1750 10/06/19 1100 10/07/19 0603  WBC 12.7*  --   --  14.3* 11.6*  HGB 8.1*   < > 8.3* 7.4* 7.6*  HCT 25.7*   < > 26.1* 24.0* 25.0*  PLT 366  --   --  326 350   < > = values in this interval not displayed.   BMET Recent Labs    10/05/19 0829 10/06/19 1100 10/07/19 0603  NA 142 143 143  K 3.8 3.9 3.9  CL 112* 113* 112*  CO2 18* 21* 20*  GLUCOSE 105* 111* 116*  BUN 34* 28* 27*  CREATININE 1.54* 1.46* 1.53*  CALCIUM 8.4* 8.5* 8.3*   LFT Recent Labs    10/05/19 0829  PROT 6.8  ALBUMIN 2.4*  AST 14*  ALT 7  ALKPHOS 73  BILITOT 0.2*   PT/INR No results for input(s): LABPROT, INR in the last 72 hours.   Studies/Results: [place] EKG SITE RITE  Result Date: 10/05/2019 If Site Rite image not  attached, placement could not be confirmed due to current cardiac rhythm.   Impression/Plan: Patient is a 64 year old female with past medical history of DM type II, CKD stage III, endometrial cancer (recently diagnosed), and MSSA bacteremia (on IV cefazolin) presenting with dysphagia and anemia in the setting of melena and heme positive stool.  Due to persistent dysphagia, ongoing anemia, and melena, we will proceed with EGD tomorrow.  If EGD is unrevealing, consider colonoscopy.  Regardless, patient will need a screening colonoscopy in the future.   Continue Protonix twice daily.  Continue to monitor H&H.  Transfuse as needed.   LOS: 2 days   Salley Slaughter  10/07/2019, 11:32 AM  Questions please call [phone]

## 2019-10-07 NOTE — Progress Notes (Signed)
PROGRESS NOTE    Vanessa [name]  VFI:[id number] DOB: [birth year] DOA: 10/04/2019 PCP: Maren Reamer, MD (Inactive)   Brief Narrative: 64 y.o. female with medical history significant forhypertension, type 2 diabetes mellitus, recent diagnosis endometrial cancer, and MSSA bacteremia currently on IV cefazolin, now presenting from her SNF for evaluation of low hemoglobin. Patient reports that she is slightly more tired than usual, but denies any lightheadedness or chest pain. She reports spending most of her days in bed for the past month.She has not noted any vaginal bleeding since the recent hospital discharge, has not had any fevers or chills, has had dark stool but attributing this to iron, and denies any abdominal pain, nausea, vomiting, or constipation. Hemoglobin was 7.0 when she was discharged 9 days ago, found to be in the mid 5 range at her SNF   Subjective:  Afebrile overnight T-max 98.6. Hemoglobin 7.6 g Blood pressure remains elevated 150s to 180s C/o pain left arm- thinks it is from Bp cuff. No more chest pain. On bedside chair on RA. no urinary symptoms  Assessment & Plan:  Symptomatic anemia: Multifactorial.  FOBT positive/possible GI bleed also with CKD and on last recent admission had vaginal bleeding when she received 3 units PRBC and was sent to SNF.  No more vaginal bleeding now per pt. Status post 2 units PRBC.  GI consulted. Continue Protonix 40 mg twice daily. she was seen on Dr. Paulita Fujita on last admission for dysphagia eval. given her anemia-FOBT+ and dysphagia GI reconsulted.  Recent Labs  Lab 10/04/19 1806 10/05/19 0829 10/05/19 1750 10/06/19 1100 10/07/19 0603  HGB 5.7* 8.1* 8.3* 7.4* 7.6*  HCT 19.3* 25.7* 26.1* 24.0* 25.0*   DM type 2/HLD: Last A1c 5.7, sugar stable continue current regimen.  Continue statin.  Monitor blood sugar. Recent Labs  Lab 10/06/19 1649 10/06/19 2014 10/07/19 0005 10/07/19 0553 10/07/19 0735  GLUCAP 126* 148* 128* 114*  112*   HTN Urgency: Blood pressure remains poorly controlled-labetalol 200 mg 3 times daily.  On last admission patient was on 10 mg of amlodipine but it appears it was not continued on discharge and resumed on 10 mg 3/28. Lisinopril and HCTZ was discontinued on last discharge.  Dental disease orthopantogram 3/14- showed significant dental disease with areas of periapical lucency "Multiple missing teeth. Periapical lucency adjacent to the root of the remaining left mandibular lateral incisor. Periapical lucency adjacent to the left maxillary incisor and left maxillary second molar. Significant resorption of the left maxillary first molar and a portion of a residual last right maxillary molar"  she was  supposed to have a follow-up on March 25 w dentist. She reports she saw dentist and had xray and reports "They could not do anything without insurance"  CKD (chronic kidney disease), stage IIIb-remains a stable.monitor. Recent Labs  Lab 10/04/19 1806 10/05/19 0829 10/06/19 1100 10/07/19 0603  BUN 40* 34* 28* 27*  CREATININE 1.76* 1.54* 1.46* 1.53*   MSSA bacteremia: MSSA grew from cultures on 3/9; 3/11 blood cultures negative,no vegetations on TTE and TEE probe could not be passed.  Recent fever and she remains on cefazolin end date April 22.  PICC line was displaced and was replaced 3/27.  Endometrial adenocarcinoma: Diagnosed earlier this month bleeding has stopped she will start on Megace and has GYN oncology referral  Esophageal dysmotility/esophageal stricture patient with dysphagia unable to pass TEE probe GI had seen the patient previously.  Was placed on dysphagia 3 diet on recent admission.  Morbid obesity  with Body mass index is 39.27 kg/m. Wt loss will benefit  Nutrition: Diet Order            Diet full liquid Room service appropriate? Yes; Fluid consistency: Thin  Diet effective now             Pressure Ulcer: Pressure Injury 09/22/19  Medial Stage 2 -  Partial thickness  loss of dermis presenting as a shallow open injury with a red, pink wound bed without slough. (Active)  09/22/19 0341  Location: -- (gluteal cleft)  Location Orientation: Medial  Staging: Stage 2 -  Partial thickness loss of dermis presenting as a shallow open injury with a red, pink wound bed without slough.  Wound Description (Comments):   Present on Admission:     DVT prophylaxis:SCD- no Heparin/lovenox 2/2 anemia Code Status: full  Family Communication: plan of care discussed with patient at bedside. Disposition Plan: Patient is from:SNF Anticipated Disposition: to SNF,in TBD Barriers to discharge or conditions that needs to be met prior to discharge: Patient admitted with severe anemia needing blood transfusion, also with having severe uncontrolled hypertension medication being adjusted.  She remains hospitalized for further monitoring of hemoglobin and possible GI evaluation and plan return to skilled nursing facility soon in 1-2 days once gi work up completed.  Consultants: GI Procedures:see note Microbiology:see note   Medications: Scheduled Meds: . amLODipine  10 mg Oral Daily  . Chlorhexidine Gluconate Cloth  6 each Topical Daily  . insulin aspart  0-6 Units Subcutaneous TID WC  . labetalol  200 mg Oral TID  . pantoprazole (PROTONIX) IV  40 mg Intravenous Q12H  . sodium chloride flush  10-40 mL Intracatheter Q12H   Continuous Infusions: .  ceFAZolin (ANCEF) IV 2 g (10/07/19 0542)    Antimicrobials: Anti-infectives (From admission, onward)   Start     Dose/Rate Route Frequency Ordered Stop   10/06/19 1800  ceFAZolin (ANCEF) IVPB 2g/100 mL premix     2 g 200 mL/hr over 30 Minutes Intravenous Every 8 hours 10/06/19 1506     10/04/19 2200  ceFAZolin (ANCEF) IVPB 2g/100 mL premix  Status:  Discontinued     2 g 200 mL/hr over 30 Minutes Intravenous Every 12 hours 10/04/19 2017 10/06/19 1506       Objective: Vitals: Today's Vitals   10/07/19 0550 10/07/19 0551  10/07/19 0817 10/07/19 0852  BP: (!) 173/90  (!) 186/85 (!) 150/92  Pulse: 73  71 80  Resp: 17     Temp:  97.9 F (36.6 C) 97.8 F (36.6 C)   TempSrc:  Axillary Oral   SpO2: 99%  98%   Weight:  90.9 kg    PainSc:        Intake/Output Summary (Last 24 hours) at 10/07/2019 1008 Last data filed at 10/07/2019 0230 Gross per 24 hour  Intake 630 ml  Output 700 ml  Net -70 ml   Filed Weights   10/04/19 2302 10/06/19 0500 10/07/19 0551  Weight: 90.4 kg 91.2 kg 90.9 kg   Weight change: -0.3 kg   Intake/Output from previous day: 03/28 0701 - 03/29 0700 In: 630 [P.O.:530; IV Piggyback:100] Out: 700 [Urine:700] Intake/Output this shift: No intake/output data recorded.  Examination:  General exam: AAOx3 not in acute distress. HEENT:Oral mucosa moist, Ear/Nose WNL grossly,dentition normal.  Teeth looks abnormal. Respiratory system: bilaterally clear,no use of accessory muscle, non tender. Cardiovascular system: S1 & S2 +, regular, No JVD. Gastrointestinal system: Abdomen soft, NT,ND, BS+. Nervous System:Alert, awake,  moving extremities and grossly nonfocal Extremities: No edema, distal peripheral pulses palpable.  Skin: No rashes,no icterus. MSK: Normal muscle bulk,tone, power  Data Reviewed: I have personally reviewed following labs and imaging studies CBC: Recent Labs  Lab 10/04/19 1806 10/05/19 0829 10/05/19 1750 10/06/19 1100 10/07/19 0603  WBC 12.1* 12.7*  --  14.3* 11.6*  HGB 5.7* 8.1* 8.3* 7.4* 7.6*  HCT 19.3* 25.7* 26.1* 24.0* 25.0*  MCV 92.8 88.9  --  91.3 92.3  PLT 365 366  --  326 599   Basic Metabolic Panel: Recent Labs  Lab 10/04/19 1806 10/05/19 0829 10/06/19 1100 10/07/19 0603  NA 138 142 143 143  K 3.5 3.8 3.9 3.9  CL 109 112* 113* 112*  CO2 19* 18* 21* 20*  GLUCOSE 129* 105* 111* 116*  BUN 40* 34* 28* 27*  CREATININE 1.76* 1.54* 1.46* 1.53*  CALCIUM 8.3* 8.4* 8.5* 8.3*   GFR: Estimated Creatinine Clearance: 36.9 mL/min (A) (by C-G formula  based on SCr of 1.53 mg/dL (H)). Liver Function Tests: Recent Labs  Lab 10/04/19 1806 10/05/19 0829  AST 18 14*  ALT 6 7  ALKPHOS 95 73  BILITOT 0.5 0.2*  PROT 7.1 6.8  ALBUMIN 2.5* 2.4*   No results for input(s): LIPASE, AMYLASE in the last 168 hours. No results for input(s): AMMONIA in the last 168 hours. Coagulation Profile: No results for input(s): INR, PROTIME in the last 168 hours. Cardiac Enzymes: No results for input(s): CKTOTAL, CKMB, CKMBINDEX, TROPONINI in the last 168 hours. BNP (last 3 results) No results for input(s): PROBNP in the last 8760 hours. HbA1C: No results for input(s): HGBA1C in the last 72 hours. CBG: Recent Labs  Lab 10/06/19 1649 10/06/19 2014 10/07/19 0005 10/07/19 0553 10/07/19 0735  GLUCAP 126* 148* 128* 114* 112*   Lipid Profile: No results for input(s): CHOL, HDL, LDLCALC, TRIG, CHOLHDL, LDLDIRECT in the last 72 hours. Thyroid Function Tests: No results for input(s): TSH, T4TOTAL, FREET4, T3FREE, THYROIDAB in the last 72 hours. Anemia Panel: No results for input(s): VITAMINB12, FOLATE, FERRITIN, TIBC, IRON, RETICCTPCT in the last 72 hours. Sepsis Labs: No results for input(s): PROCALCITON, LATICACIDVEN in the last 168 hours.  Recent Results (from the past 240 hour(s))  SARS CORONAVIRUS 2 (TAT 6-24 HRS) Nasopharyngeal Nasopharyngeal Swab     Status: None   Collection Time: 10/04/19  8:17 PM   Specimen: Nasopharyngeal Swab  Result Value Ref Range Status   SARS Coronavirus 2 NEGATIVE NEGATIVE Final    Comment: (NOTE) SARS-CoV-2 target nucleic acids are NOT DETECTED. The SARS-CoV-2 RNA is generally detectable in upper and lower respiratory specimens during the acute phase of infection. Negative results do not preclude SARS-CoV-2 infection, do not rule out co-infections with other pathogens, and should not be used as the sole basis for treatment or other patient management decisions. Negative results must be combined with clinical  observations, patient history, and epidemiological information. The expected result is Negative. Fact Sheet for Patients: SugarRoll.be Fact Sheet for Healthcare Providers: https://www.woods-mathews.com/ This test is not yet approved or cleared by the [place] FDA and  has been authorized for detection and/or diagnosis of SARS-CoV-2 by FDA under an Emergency Use Authorization (EUA). This EUA will remain  in effect (meaning this test can be used) for the duration of the COVID-19 declaration under Section 56 4(b)(1) of the Act, 21 U.S.C. section 360bbb-3(b)(1), unless the authorization is terminated or revoked sooner. Performed at [facility], [place] [address] [place], [place] [address]  MRSA PCR Screening     Status: None   Collection Time: 10/07/19  3:10 AM   Specimen: Nasal Mucosa; Nasopharyngeal  Result Value Ref Range Status   MRSA by PCR NEGATIVE NEGATIVE Final    Comment:        The GeneXpert MRSA Assay (FDA approved for NASAL specimens only), is one component of a comprehensive MRSA colonization surveillance program. It is not intended to diagnose MRSA infection nor to guide or monitor treatment for MRSA infections. Performed at [facility], [place] [address], [place], [place] [address]       Radiology Studies: [place] EKG SITE RITE  Result Date: 10/05/2019 If [facility] image not attached, placement could not be confirmed due to current cardiac rhythm.    LOS: 2 days   Time spent: More than 50% of that time was spent in counseling and/or coordination of care.  Antonieta Pert, MD [facility]  10/07/2019, 10:08 AM

## 2019-10-08 ENCOUNTER — Encounter (HOSPITAL_COMMUNITY): Payer: Self-pay | Admitting: Internal Medicine

## 2019-10-08 ENCOUNTER — Inpatient Hospital Stay (HOSPITAL_COMMUNITY): Payer: Medicare Other | Admitting: Anesthesiology

## 2019-10-08 ENCOUNTER — Encounter (HOSPITAL_COMMUNITY)
Admission: EM | Disposition: A | Discharge status: Skilled Nursing Facility | Payer: Self-pay | Source: Skilled Nursing Facility | Attending: Internal Medicine

## 2019-10-08 HISTORY — PX: ESOPHAGOGASTRODUODENOSCOPY: SHX5428

## 2019-10-08 HISTORY — PX: BIOPSY: SHX5522

## 2019-10-08 LAB — BASIC METABOLIC PANEL
Anion gap: 10 (ref 5–15)
BUN: 23 mg/dL (ref 8–23)
CO2: 22 mmol/L (ref 22–32)
Calcium: 8.4 mg/dL — ABNORMAL LOW (ref 8.9–10.3)
Chloride: 111 mmol/L (ref 98–111)
Creatinine, Ser: 1.58 mg/dL — ABNORMAL HIGH (ref 0.44–1.00)
GFR calc Af Amer: 39 mL/min — ABNORMAL LOW (ref >60)
GFR calc non Af Amer: 34 mL/min — ABNORMAL LOW (ref >60)
Glucose, Bld: 100 mg/dL — ABNORMAL HIGH (ref 70–99)
Potassium: 3.6 mmol/L (ref 3.5–5.1)
Sodium: 143 mmol/L (ref 135–145)

## 2019-10-08 LAB — CBC
HCT: 23.4 % — ABNORMAL LOW (ref 36.0–46.0)
Hemoglobin: 7 g/dL — ABNORMAL LOW (ref 12.0–15.0)
MCH: 28.1 pg (ref 26.0–34.0)
MCHC: 29.9 g/dL — ABNORMAL LOW (ref 30.0–36.0)
MCV: 94 fL (ref 80.0–100.0)
Platelets: 309 10*3/uL (ref 150–400)
RBC: 2.49 MIL/uL — ABNORMAL LOW (ref 3.87–5.11)
RDW: 21.6 % — ABNORMAL HIGH (ref 11.5–15.5)
WBC: 8.3 10*3/uL (ref 4.0–10.5)
nRBC: 0 % (ref 0.0–0.2)

## 2019-10-08 LAB — GLUCOSE, CAPILLARY
Glucose-Capillary: 92 mg/dL (ref 70–99)
Glucose-Capillary: 92 mg/dL (ref 70–99)
Glucose-Capillary: 85 mg/dL (ref 70–99)
Glucose-Capillary: 70 mg/dL (ref 70–99)

## 2019-10-08 LAB — HEMOGLOBIN AND HEMATOCRIT, BLOOD
HCT: 26.4 % — ABNORMAL LOW (ref 36.0–46.0)
Hemoglobin: 7.7 g/dL — ABNORMAL LOW (ref 12.0–15.0)

## 2019-10-08 SURGERY — EGD (ESOPHAGOGASTRODUODENOSCOPY)
Anesthesia: Monitor Anesthesia Care

## 2019-10-08 MED ORDER — HYDRALAZINE HCL 50 MG PO TABS
50.0000 mg | ORAL_TABLET | Freq: Three times a day (TID) | ORAL | Status: DC
  Administered 2019-10-08 – 2019-10-10 (×7): 50 mg via ORAL
  Filled 2019-10-08 (×7): qty 1

## 2019-10-08 MED ORDER — PANTOPRAZOLE SODIUM 40 MG PO PACK
40.0000 mg | PACK | Freq: Every day | ORAL | Status: DC
  Filled 2019-10-08: qty 20

## 2019-10-08 MED ORDER — PANTOPRAZOLE SODIUM 40 MG PO TBEC
40.0000 mg | DELAYED_RELEASE_TABLET | Freq: Every day | ORAL | Status: DC
  Filled 2019-10-08: qty 1

## 2019-10-08 MED ORDER — LACTATED RINGERS IV SOLN: INTRAVENOUS | Status: DC

## 2019-10-08 MED ORDER — SODIUM CHLORIDE 0.9 % IV SOLN: INTRAVENOUS | Status: DC

## 2019-10-08 MED ORDER — LACTATED RINGERS IV SOLN: INTRAVENOUS | Status: DC | PRN

## 2019-10-08 MED ORDER — PANTOPRAZOLE SODIUM 40 MG PO PACK
40.0000 mg | PACK | Freq: Every day | ORAL | Status: DC
  Administered 2019-10-09 – 2019-10-10 (×2): 40 mg via ORAL
  Filled 2019-10-08 (×2): qty 20

## 2019-10-08 MED ORDER — PROPOFOL 500 MG/50ML IV EMUL
INTRAVENOUS | Status: DC | PRN
  Administered 2019-10-08: 75 ug/kg/min via INTRAVENOUS

## 2019-10-08 NOTE — Op Note (Signed)
[facility] Patient Name: Vanessa [name] Procedure Date : 10/08/2019 MRN: [id number] Attending MD: Clarene Essex , MD Date of Birth: 03/28/1956 CSN: [id number] Age: 64 Admit Type: Inpatient Procedure:                Upper GI endoscopy Indications:              Melena anemia dysphagia probably secondary to her                            teeth problem Providers:                Clarene Essex, MD, Glori Bickers, RN, Theodora Blow,                            Technician Referring MD:              Medicines:                Propofol total dose 65 mg IV Complications:            No immediate complications. Estimated Blood Loss:     Estimated blood loss: none. Procedure:                Pre-Anesthesia Assessment:                           - Prior to the procedure, a History and Physical                            was performed, and patient medications and                            allergies were reviewed. The patient's tolerance of                            previous anesthesia was also reviewed. The risks                            and benefits of the procedure and the sedation                            options and risks were discussed with the patient.                            All questions were answered, and informed consent                            was obtained. Prior Anticoagulants: The patient has                            taken no previous anticoagulant or antiplatelet                            agents except for aspirin. ASA Grade Assessment: II                            -  A patient with mild systemic disease. After                            reviewing the risks and benefits, the patient was                            deemed in satisfactory condition to undergo the                            procedure.                           After obtaining informed consent, the endoscope was                            passed under direct vision. Throughout the   procedure, the patient's blood pressure, pulse, and                            oxygen saturations were monitored continuously. The                            [id number]-[id number] ([id number]) Olympus gastroscope was                            introduced through the mouth, and advanced to the                            fourth part of duodenum. The upper GI endoscopy was                            accomplished without difficulty. The patient                            tolerated the procedure well. Scope In: Scope Out: Findings:      A tiny hiatal hernia was present.      The entire examined stomach was normal. Biopsies were taken with a cold       forceps for histology of antrum and fundus to rule out H. pylori.      One small non-bleeding cratered duodenal ulcer with no stigmata of       bleeding was found in the duodenal bulb.      The first portion of the duodenum, second portion of the duodenum, third       portion of the duodenum and fourth portion of the duodenum were normal.      The exam was otherwise without abnormality. Impression:               - Tiny hiatal hernia.                           - Normal stomach. Biopsied rule out H. pylori as                            above.                           -  Non-bleeding small duodenal ulcer with no                            stigmata of bleeding.                           - Normal first portion of the duodenum, second                            portion of the duodenum, third portion of the                            duodenum and fourth portion of the duodenum.                           - The examination was otherwise normal. Recommendation:           - Clear liquid diet today. If outpatient                            colonoscopy is agreed upon you can fairly quickly                            advance her diet                           - No aspirin, ibuprofen, naproxen, or other                            non-steroidal anti-inflammatory drugs  long-term                            unless absolutely needed and make sure you continue                            pump inhibitors to prevent ulcer recurrence.                           - Await pathology results. Treat H. pylori if                            positive                           - Return to GI clinic PRN.                           - Telephone GI clinic for pathology results in 1                            week.                           - Telephone GI clinic if symptomatic PRN. If no  aspirin or nonsteroidals are going to be used can                            stop pump inhibitors in 3 months                           - Perform a colonoscopy at appointment to be                            scheduled. We will discuss the timing of that with                            her but prefer outpatient if she is willing Procedure Code(s):        --- Professional ---                           [phone], Esophagogastroduodenoscopy, flexible,                            transoral; with biopsy, single or multiple Diagnosis Code(s):        --- Professional ---                           K44.9, Diaphragmatic hernia without obstruction or                            gangrene                           K26.9, Duodenal ulcer, unspecified as acute or                            chronic, without hemorrhage or perforation                           K92.1, Melena (includes Hematochezia) CPT copyright 2019 American Medical Association. All rights reserved. The codes documented in this report are preliminary and upon coder review may  be revised to meet current compliance requirements. Clarene Essex, MD 10/08/2019 12:53:36 PM This report has been signed electronically. Number of Addenda: 0

## 2019-10-08 NOTE — Transfer of Care (Signed)
Immediate Anesthesia Transfer of Care Note  Patient: Athleen Feltner  Procedure(s) Performed: ESOPHAGOGASTRODUODENOSCOPY (EGD) (N/A ) BIOPSY  Patient Location: PACU and Endoscopy Unit  Anesthesia Type:MAC  Level of Consciousness: awake and alert   Airway & Oxygen Therapy: Patient Spontanous Breathing  Post-op Assessment: Report given to RN and Post -op Vital signs reviewed and stable  Post vital signs: Reviewed and stable  Last Vitals:  Vitals Value Taken Time  BP 164/115 10/08/19 1250  Temp    Pulse 72 10/08/19 1257  Resp 19 10/08/19 1257  SpO2 100 % 10/08/19 1257  Vitals shown include unvalidated device data.  Last Pain:  Vitals:   10/08/19 1250  TempSrc:   PainSc: 0-No pain      Patients Stated Pain Goal: 0 (12/75/17 0017)  Complications: No apparent anesthesia complications

## 2019-10-08 NOTE — Anesthesia Preprocedure Evaluation (Signed)
Anesthesia Evaluation  Patient identified by MRN, date of birth, ID band Patient awake    Reviewed: Allergy & Precautions, H&P , NPO status , Patient's Chart, lab work & pertinent test results  Airway Mallampati: III  TM Distance: >3 FB Neck ROM: Full    Dental no notable dental hx. (+) Poor Dentition, Dental Advisory Given, Chipped, Missing, Loose   Pulmonary asthma ,    Pulmonary exam normal breath sounds clear to auscultation       Cardiovascular hypertension, + CAD  + Valvular Problems/Murmurs MR  Rhythm:Regular Rate:Normal     Neuro/Psych negative neurological ROS  negative psych ROS   GI/Hepatic negative GI ROS, Neg liver ROS,   Endo/Other  diabetes, Insulin Dependent  Renal/GU Renal InsufficiencyRenal disease  negative genitourinary   Musculoskeletal   Abdominal   Peds  Hematology  (+) Blood dyscrasia, anemia ,   Anesthesia Other Findings   Reproductive/Obstetrics negative OB ROS                             Anesthesia Physical  Anesthesia Plan  ASA: III  Anesthesia Plan: MAC   Post-op Pain Management:    Induction: Intravenous  PONV Risk Score and Plan: 2 and Propofol infusion and Treatment may vary due to age or medical condition  Airway Management Planned: Nasal Cannula  Additional Equipment:   Intra-op Plan:   Post-operative Plan:   Informed Consent: I have reviewed the patients History and Physical, chart, labs and discussed the procedure including the risks, benefits and alternatives for the proposed anesthesia with the patient or authorized representative who has indicated his/her understanding and acceptance.     Dental advisory given  Plan Discussed with: CRNA  Anesthesia Plan Comments:         Anesthesia Quick Evaluation

## 2019-10-08 NOTE — Progress Notes (Signed)
Lunell Robart 12:22 PM  Subjective: Patient without any new complaints in her hospital computer chart reviewed and her case discussed with my partner Dr. Penelope Coop and the anesthesia team and the procedure was rediscussed  Objective: Vital signs stable afebrile no acute distress exam please see preassessment evaluation Slight decrease hemoglobin slight decrease BUN Assessment: GI blood loss questionable etiology  Plan: Okay to proceed with endoscopy she is aware of her teeth problem with further work-up and plans pending those findings  [facility]  office [phone] After 5PM or if no answer call [phone]

## 2019-10-08 NOTE — Anesthesia Procedure Notes (Signed)
Procedure Name: MAC Date/Time: 10/08/2019 12:27 PM Performed by: Inda Coke, CRNA Pre-anesthesia Checklist: Patient identified, Emergency Drugs available, Suction available, Timeout performed and Patient being monitored Patient Re-evaluated:Patient Re-evaluated prior to induction Oxygen Delivery Method: Nasal cannula Induction Type: IV induction Dental Injury: Teeth and Oropharynx as per pre-operative assessment

## 2019-10-08 NOTE — Progress Notes (Signed)
PROGRESS NOTE    Vanessa [name]  YKD:[id number] DOB: [birth year] DOA: 10/04/2019 PCP: Maren Reamer, MD (Inactive)   Brief Narrative: 64 y.o. female with medical history significant forhypertension, type 2 diabetes mellitus, recent diagnosis endometrial cancer, and MSSA bacteremia currently on IV cefazolin, now presenting from her SNF for evaluation of low hemoglobin. Patient reports that she is slightly more tired than usual, but denies any lightheadedness or chest pain. She reports spending most of her days in bed for the past month.She has not noted any vaginal bleeding since the recent hospital discharge, has not had any fevers or chills, has had dark stool but attributing this to iron, and denies any abdominal pain, nausea, vomiting, or constipation. Hemoglobin was 7.0 when she was discharged 9 days ago, found to be in the mid 5 range at her SNF   Subjective: no Nausea vomiting.  Awaiting for endoscopy this morning. Blood pressure remains up 140s to 170s. Creatinine is stable hemoglobin down trended to 7.0 g.  Assessment & Plan:  Symptomatic anemia: Multifactorial.  FOBT positive/possible GI bleed also with CKD and on last recent admission had vaginal bleeding when she received 3 units PRBC and was sent to SNF.  No more vaginal bleeding now per pt. Status post 2 units PRBC.  GI consulted. Continue Protonix 40 mg twice daily. she was seen on Dr. Paulita Fujita on last admission for dysphagia eval. given her anemia-FOBT+ and dysphagia GI reconsulted-and plan for EGD 3/30, possible colonoscopy.  Monitor hemoglobin and transfuse as needed Recent Labs  Lab 10/05/19 0829 10/05/19 1750 10/06/19 1100 10/07/19 0603 10/08/19 0454  HGB 8.1* 8.3* 7.4* 7.6* 7.0*  HCT 25.7* 26.1* 24.0* 25.0* 23.4*   DM type 2/HLD: Last A1c 5.7, sugar stable continue current regimen.  Continue statin.  Monitor blood sugar. Recent Labs  Lab 10/07/19 1614 10/07/19 2006 10/07/19 2341 10/08/19 0433 10/08/19  0731  GLUCAP 121* 138* 114* 92 92   HTN Urgency: Blood pressure poorly controlled-on labetalol 200 mg 3 times daily.  On last admission patient was on 10 mg of amlodipine but it appears it was not continued on discharge and resumed on 10 mg amlodipine 3/28. Lisinopril and HCTZ was discontinued on last discharge.  If remains poorly controlled will add hydralazine po today  Dental disease orthopantogram 3/14- showed significant dental disease with areas of periapical lucency "Multiple missing teeth. Periapical lucency adjacent to the root of the remaining left mandibular lateral incisor. Periapical lucency adjacent to the left maxillary incisor and left maxillary second molar. Significant resorption of the left maxillary first molar and a portion of a residual last right maxillary molar"  she was  supposed to have a follow-up on March 25 w dentist. She reports she saw dentist and had xray and reports "They could not do anything without insurance"  CKD (chronic kidney disease), stage IIIb-remains a stable.monitor. Recent Labs  Lab 10/04/19 1806 10/05/19 0829 10/06/19 1100 10/07/19 0603 10/08/19 0454  BUN 40* 34* 28* 27* 23  CREATININE 1.76* 1.54* 1.46* 1.53* 1.58*   MSSA bacteremia: MSSA grew from cultures on 3/9; 3/11 blood cultures negative,no vegetations on TTE and TEE probe could not be passed.  Recent fever and she remains on cefazolin end date April 22.  PICC line was displaced and was replaced 3/27.  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  Was placed on dysphagia 3 diet on recent admission.for EGD now.  Morbid obesity with Body mass index is 39.27 kg/m. Wt loss will benefit  Nutrition: Diet Order            Diet NPO  time specified  Diet effective midnight             Pressure Ulcer: Pressure Injury 09/22/19  Medial Stage 2 -  Partial thickness loss of dermis presenting as a shallow open injury with a red, pink wound bed without slough. (Active)  09/22/19 0341  Location: -- (gluteal cleft)  Location Orientation: Medial  Staging: Stage 2 -  Partial thickness loss of dermis presenting as a shallow open injury with a red, pink wound bed without slough.  Wound Description (Comments):   Present on Admission:     DVT prophylaxis:SCD- no Heparin/lovenox 2/2 anemia Code Status: full  Family Communication: plan of care discussed with patient at bedside.  I tried to call patient's husband no- no answer. Disposition Plan: Patient is from:SNF Anticipated Disposition: to SNF,in TBD Barriers to discharge or conditions that needs to be met prior to discharge: Patient admitted with severe anemia needing blood transfusion, also with having severe uncontrolled hypertension medication being adjusted.  She remains hospitalized for further monitoring of hemoglobin, GI evaluation and plan return to skilled nursing facility soon in 1-2 days once gi work up completed.  Consultants: GI Procedures:see note Microbiology:see note   Medications: Scheduled Meds: . amLODipine  10 mg Oral Daily  . Chlorhexidine Gluconate Cloth  6 each Topical Daily  . insulin aspart  0-6 Units Subcutaneous TID WC  . labetalol  200 mg Oral TID  . pantoprazole (PROTONIX) IV  40 mg Intravenous Q12H  . sodium chloride flush  10-40 mL Intracatheter Q12H   Continuous Infusions: .  ceFAZolin (ANCEF) IV 2 g (10/08/19 0451)    Antimicrobials: Anti-infectives (From admission, onward)   Start     Dose/Rate Route Frequency Ordered Stop   10/06/19 1800  ceFAZolin (ANCEF) IVPB 2g/100 mL premix     2 g 200 mL/hr over 30 Minutes Intravenous Every 8 hours 10/06/19 1506     10/04/19 2200  ceFAZolin (ANCEF) IVPB 2g/100 mL premix  Status:   Discontinued     2 g 200 mL/hr over 30 Minutes Intravenous Every 12 hours 10/04/19 2017 10/06/19 1506       Objective: Vitals: Today's Vitals   10/07/19 1652 10/07/19 1956 10/07/19 2000 10/08/19 0448  BP: (!) 177/85   (!) 173/82  Pulse: 74   69  Resp:    14  Temp:  97.8 F (36.6 C)  97.7 F (36.5 C)  TempSrc:  Axillary  Axillary  SpO2:    100%  Weight:      PainSc:   0-No pain     Intake/Output Summary (Last 24 hours) at 10/08/2019 0812 Last data filed at 10/08/2019 0522 Gross per 24 hour  Intake 480 ml  Output 200 ml  Net 280 ml   Filed Weights   10/04/19 2302 10/06/19 0500 10/07/19 0551  Weight: 90.4 kg 91.2 kg 90.9 kg   Weight change:    Intake/Output from previous day: 03/29 0701 - 03/30 0700 In: 480 [P.O.:480] Out: 200 [Urine:200] Intake/Output this shift: No intake/output data recorded.  Examination:  General exam: AAOx3 not in acute distress. HEENT:Oral mucosa moist, Ear/Nose WNL grossly,dentition normal.  Teeth looks abnormal. Respiratory system: bilaterally clear not in  distress, no use of accessory muscles.   Cardiovascular system: S1 & S2 +, regular, No JVD. Gastrointestinal system: Abdomen soft, NT,ND, BS+. Nervous System:Alert, awake, moving extremities and grossly nonfocal Extremities: No edema, distal peripheral pulses palpable.  Skin: No rashes,no icterus. MSK: Normal muscle bulk,tone, power  Data Reviewed: I have personally reviewed following labs and imaging studies CBC: Recent Labs  Lab 10/04/19 1806 10/04/19 1806 10/05/19 0829 10/05/19 1750 10/06/19 1100 10/07/19 0603 10/08/19 0454  WBC 12.1*  --  12.7*  --  14.3* 11.6* 8.3  HGB 5.7*   < > 8.1* 8.3* 7.4* 7.6* 7.0*  HCT 19.3*   < > 25.7* 26.1* 24.0* 25.0* 23.4*  MCV 92.8  --  88.9  --  91.3 92.3 94.0  PLT 365  --  366  --  326 350 309   < > = values in this interval not displayed.   Basic Metabolic Panel: Recent Labs  Lab 10/04/19 1806 10/05/19 0829 10/06/19 1100 10/07/19  0603 10/08/19 0454  NA 138 142 143 143 143  K 3.5 3.8 3.9 3.9 3.6  CL 109 112* 113* 112* 111  CO2 19* 18* 21* 20* 22  GLUCOSE 129* 105* 111* 116* 100*  BUN 40* 34* 28* 27* 23  CREATININE 1.76* 1.54* 1.46* 1.53* 1.58*  CALCIUM 8.3* 8.4* 8.5* 8.3* 8.4*   GFR: Estimated Creatinine Clearance: 35.7 mL/min (A) (by C-G formula based on SCr of 1.58 mg/dL (H)). Liver Function Tests: Recent Labs  Lab 10/04/19 1806 10/05/19 0829  AST 18 14*  ALT 6 7  ALKPHOS 95 73  BILITOT 0.5 0.2*  PROT 7.1 6.8  ALBUMIN 2.5* 2.4*   No results for input(s): LIPASE, AMYLASE in the last 168 hours. No results for input(s): AMMONIA in the last 168 hours. Coagulation Profile: No results for input(s): INR, PROTIME in the last 168 hours. Cardiac Enzymes: No results for input(s): CKTOTAL, CKMB, CKMBINDEX, TROPONINI in the last 168 hours. BNP (last 3 results) No results for input(s): PROBNP in the last 8760 hours. HbA1C: No results for input(s): HGBA1C in the last 72 hours. CBG: Recent Labs  Lab 10/07/19 1614 10/07/19 2006 10/07/19 2341 10/08/19 0433 10/08/19 0731  GLUCAP 121* 138* 114* 92 92   Lipid Profile: No results for input(s): CHOL, HDL, LDLCALC, TRIG, CHOLHDL, LDLDIRECT in the last 72 hours. Thyroid Function Tests: No results for input(s): TSH, T4TOTAL, FREET4, T3FREE, THYROIDAB in the last 72 hours. Anemia Panel: No results for input(s): VITAMINB12, FOLATE, FERRITIN, TIBC, IRON, RETICCTPCT in the last 72 hours. Sepsis Labs: No results for input(s): PROCALCITON, LATICACIDVEN in the last 168 hours.  Recent Results (from the past 240 hour(s))  SARS CORONAVIRUS 2 (TAT 6-24 HRS) Nasopharyngeal Nasopharyngeal Swab     Status: None   Collection Time: 10/04/19  8:17 PM   Specimen: Nasopharyngeal Swab  Result Value Ref Range Status   SARS Coronavirus 2 NEGATIVE NEGATIVE Final    Comment: (NOTE) SARS-CoV-2 target nucleic acids are NOT DETECTED. The SARS-CoV-2 RNA is generally detectable in  upper and lower respiratory specimens during the acute phase of infection. Negative results do not preclude SARS-CoV-2 infection, do not rule out co-infections with other pathogens, and should not be used as the sole basis for treatment or other patient management decisions. Negative results must be combined with clinical observations, patient history, and epidemiological information. The expected result is Negative. Fact Sheet for Patients: SugarRoll.be Fact Sheet for Healthcare Providers: https://www.woods-mathews.com/ This test is not yet approved or cleared by the [place]  States FDA and  has been authorized for detection and/or diagnosis of SARS-CoV-2 by FDA under an Emergency Use Authorization (EUA). This EUA will remain  in effect (meaning this test can be used) for the duration of the COVID-19 declaration under Section 56 4(b)(1) of the Act, 21 U.S.C. section 360bbb-3(b)(1), unless the authorization is terminated or revoked sooner. Performed at [facility], [place] [address] [place], [place] [address]   MRSA PCR Screening     Status: None   Collection Time: 10/07/19  3:10 AM   Specimen: Nasal Mucosa; Nasopharyngeal  Result Value Ref Range Status   MRSA by PCR NEGATIVE NEGATIVE Final    Comment:        The GeneXpert MRSA Assay (FDA approved for NASAL specimens only), is one component of a comprehensive MRSA colonization surveillance program. It is not intended to diagnose MRSA infection nor to guide or monitor treatment for MRSA infections. Performed at [facility], [place] [address] [place], [place] [address]       Radiology Studies: No results found.   LOS: 3 days   Time spent: More than 50% of that time was spent in counseling and/or coordination of care.  Antonieta Pert, MD [facility]  10/08/2019, 8:12 AM

## 2019-10-09 ENCOUNTER — Ambulatory Visit: Payer: Medicare Other

## 2019-10-09 ENCOUNTER — Encounter (HOSPITAL_COMMUNITY): Payer: Self-pay | Admitting: Internal Medicine

## 2019-10-09 DIAGNOSIS — D649 Anemia, unspecified: Secondary | ICD-10-CM

## 2019-10-09 HISTORY — DX: Anemia, unspecified: D64.9

## 2019-10-09 LAB — SARS CORONAVIRUS 2 (TAT 6-24 HRS): SARS Coronavirus 2: NEGATIVE

## 2019-10-09 LAB — BASIC METABOLIC PANEL
Anion gap: 9 (ref 5–15)
BUN: 20 mg/dL (ref 8–23)
CO2: 20 mmol/L — ABNORMAL LOW (ref 22–32)
Calcium: 8.2 mg/dL — ABNORMAL LOW (ref 8.9–10.3)
Chloride: 106 mmol/L (ref 98–111)
Creatinine, Ser: 1.62 mg/dL — ABNORMAL HIGH (ref 0.44–1.00)
GFR calc Af Amer: 38 mL/min — ABNORMAL LOW (ref >60)
GFR calc non Af Amer: 33 mL/min — ABNORMAL LOW (ref >60)
Glucose, Bld: 85 mg/dL (ref 70–99)
Potassium: 3.8 mmol/L (ref 3.5–5.1)
Sodium: 135 mmol/L (ref 135–145)

## 2019-10-09 LAB — GLUCOSE, CAPILLARY
Glucose-Capillary: 83 mg/dL (ref 70–99)
Glucose-Capillary: 89 mg/dL (ref 70–99)
Glucose-Capillary: 135 mg/dL — ABNORMAL HIGH (ref 70–99)
Glucose-Capillary: 132 mg/dL — ABNORMAL HIGH (ref 70–99)

## 2019-10-09 LAB — CBC
HCT: 25.7 % — ABNORMAL LOW (ref 36.0–46.0)
Hemoglobin: 7.6 g/dL — ABNORMAL LOW (ref 12.0–15.0)
MCH: 27.6 pg (ref 26.0–34.0)
MCHC: 29.6 g/dL — ABNORMAL LOW (ref 30.0–36.0)
MCV: 93.5 fL (ref 80.0–100.0)
Platelets: 342 10*3/uL (ref 150–400)
RBC: 2.75 MIL/uL — ABNORMAL LOW (ref 3.87–5.11)
RDW: 21.2 % — ABNORMAL HIGH (ref 11.5–15.5)
WBC: 8.1 10*3/uL (ref 4.0–10.5)
nRBC: 0 % (ref 0.0–0.2)

## 2019-10-09 NOTE — Anesthesia Postprocedure Evaluation (Signed)
Anesthesia Post Note  Patient: Vanessa [name]  Procedure(s) Performed: ESOPHAGOGASTRODUODENOSCOPY (EGD) (N/A ) BIOPSY     Patient location during evaluation: PACU Anesthesia Type: MAC Level of consciousness: awake and alert Pain management: pain level controlled Vital Signs Assessment: post-procedure vital signs reviewed and stable Respiratory status: spontaneous breathing, nonlabored ventilation, respiratory function stable and patient connected to nasal cannula oxygen Cardiovascular status: stable and blood pressure returned to baseline Postop Assessment: no apparent nausea or vomiting Anesthetic complications: no    Last Vitals:  Vitals:   10/09/19 0958 10/09/19 1304  BP: (!) 150/85 (!) 181/92  Pulse: 74   Resp:    Temp:    SpO2:      Last Pain:  Vitals:   10/09/19 0800  TempSrc:   PainSc: 4                  Tiajuana Amass

## 2019-10-09 NOTE — Progress Notes (Signed)
Physical Therapy Treatment Patient Details Name: Vanessa [name] MRN: [id number] DOB: April 22, 1956 Today's Date: 10/09/2019    History of Present Illness Pt is 64 y.o. female with medical history significant for hypertension, type 2 diabetes mellitus, recent diagnosis endometrial cancer, and MSSA.  Pt with recent hospital admission with vaginal bleeding and now with possible GIB.  Additionally pt with L humeral fx with callus formation consistent with healing - per last admission ok to use functionally and WBAT.    PT Comments    Pt OOB in recliner upon arrival of PT, agreeable to session with focus on progressing functional mobility and stability. The pt continues to present with limitations in functional mobility, endurance, and dynamic stability compared to their prior level of function and independence due to above dx. The pt was able to demo improvements in ambulation, but continues to need external support to maintain stability. The pt also required a standing rest break after ~75 ft, but was able to maintain SpO2 >96% throughout. The pt will continue to benefit from skilled PT to further progress functional strength, endurance, and ultimately independence prior to d/c home.     Follow Up Recommendations  SNF;Supervision/Assistance - 24 hour     Equipment Recommendations  Rolling walker with 5" wheels    Recommendations for Other Services       Precautions / Restrictions Precautions Precautions: Fall Shoulder Interventions: Shoulder sling/immobilizer;For comfort Precaution Comments: L proximal humerus fx healing (not acute)- per admission 09/20/19 pt can use arm functionally and WBAT, sling for comfort Required Braces or Orthoses: Sling Restrictions Weight Bearing Restrictions: Yes LUE Weight Bearing: Weight bearing as tolerated Other Position/Activity Restrictions: L shoulder pain with xray finding proximal humerus fx healing (not acute)- per chat text with Dr. Cathlean Sauer (09/20/2019)  pt can use arm functionally and WBAT, sling for comfort    Mobility  Bed Mobility               General bed mobility comments: pt OOB in recliner upon arrival of PT  Transfers Overall transfer level: Needs assistance Equipment used: Rolling walker (2 wheeled) Transfers: Sit to/from Stand Sit to Stand: Supervision         General transfer comment: VCs for sequencing and safety when standing  Ambulation/Gait Ambulation/Gait assistance: Min guard Gait Distance (Feet): 150 Feet Assistive device: IV Pole Gait Pattern/deviations: Step-through pattern;Decreased stride length;Drifts right/left Gait velocity: 0.25 m/s Gait velocity interpretation: <1.8 ft/sec, indicate of risk for recurrent falls General Gait Details: pt able to navigate in hallway with use of IV pole for balance instead of RW due to use of sling on LUE for comfort. one standing rest break after ~75 ft VSS   Stairs             Wheelchair Mobility    Modified Rankin (Stroke Patients Only)       Balance Overall balance assessment: Needs assistance Sitting-balance support: Feet supported Sitting balance-Leahy Scale: Good     Standing balance support: Single extremity supported;During functional activity Standing balance-Leahy Scale: Fair Standing balance comment: Pt relient on single UE support during ADLs                            Cognition Arousal/Alertness: Awake/alert Behavior During Therapy: WFL for tasks assessed/performed Overall Cognitive Status: No family/caregiver present to determine baseline cognitive functioning Area of Impairment: Awareness  Awareness: Emergent   General Comments: pt with good functional cognition during session today, able to verbalize needs and demos good safety with ambulation      Exercises      General Comments        Pertinent Vitals/Pain Pain Assessment: Faces Faces Pain Scale: Hurts a little  bit Pain Location: L UE Pain Descriptors / Indicators: Discomfort;Guarding;Sore Pain Intervention(s): Limited activity within patient's tolerance;Monitored during session;Repositioned    Home Living Family/patient expects to be discharged to:: Unsure Living Arrangements: Spouse/significant other                  Prior Function            PT Goals (current goals can now be found in the care plan section) Acute Rehab PT Goals Patient Stated Goal: return to SNF at d/c to get stronger PT Goal Formulation: With patient Time For Goal Achievement: 10/21/19 Potential to Achieve Goals: Good Progress towards PT goals: Progressing toward goals    Frequency    Min 2X/week      PT Plan Current plan remains appropriate    Co-evaluation              AM-PAC PT "6 Clicks" Mobility   Outcome Measure  Help needed turning from your back to your side while in a flat bed without using bedrails?: A Little Help needed moving from lying on your back to sitting on the side of a flat bed without using bedrails?: A Little Help needed moving to and from a bed to a chair (including a wheelchair)?: A Little Help needed standing up from a chair using your arms (e.g., wheelchair or bedside chair)?: A Little Help needed to walk in hospital room?: A Little Help needed climbing 3-5 steps with a railing? : A Lot 6 Click Score: 17    End of Session Equipment Utilized During Treatment: Gait belt(sling LUE for comfort) Activity Tolerance: Patient tolerated treatment well Patient left: with call bell/phone within reach;in chair Nurse Communication: Mobility status PT Visit Diagnosis: Unsteadiness on feet (R26.81);Muscle weakness (generalized) (M62.81) Pain - Right/Left: Left Pain - part of body: Arm     Time: 0100-7121 PT Time Calculation (min) (ACUTE ONLY): 22 min  Charges:  $Gait Training: 8-22 mins                     Karma Ganja, PT, DPT   Acute Rehabilitation Department Pager #:  [phone]   Otho Bellows 10/09/2019, 3:03 PM

## 2019-10-09 NOTE — TOC Progression Note (Addendum)
Transition of Care [facility]) - Progression Note    Patient Details  Name: Keri Veale MRN: [id number] Date of Birth: [birth year]  Transition of Care [facility]) CM/SW [place], [place] Phone Number: 10/09/2019, 11:44 AM  Clinical Narrative:    CSW spoke with pt's facility.  Pt will need to see PT today and will need covid test for pending discharge on Thursday if medically stable. CSW has contacted RN and physician with updated information. Facility has started [employer] fast streamer. CSW will continue to follow for disposition planning.   Expected Discharge Plan: Skilled Nursing Facility Barriers to Discharge: Continued Medical Work up, Ship broker  Expected Discharge Plan and Services Expected Discharge Plan: [place]       Living arrangements for the past 2 months: Apartment                                       Social Determinants of Health (SDOH) Interventions    Readmission Risk Interventions Readmission Risk Prevention Plan 09/25/2019  Transportation Screening Complete  PCP or Specialist Appt within 3-5 Days Not Complete  Not Complete comments plan for SNF  [place] or [place] Complete  Social Work Consult for [place] Planning/Counseling Complete  Palliative Care Screening Not Applicable  Medication Review Press photographer) Referral to Pharmacy  Some recent data might be hidden

## 2019-10-09 NOTE — Progress Notes (Signed)
PHARMACY CONSULT NOTE FOR:  OUTPATIENT  PARENTERAL ANTIBIOTIC THERAPY (OPAT)  Indication: MSSA bacteremia Regimen: Cefazolin 2 gm every 8 hours End date: 10/31/19  IV antibiotic discharge orders are pended. To discharging provider:  please sign these orders via discharge navigator,  Select New Orders & click on the button choice - Manage This Unsigned Work.     Thank you for allowing pharmacy to be a part of this patient's care.  Jimmy Footman, PharmD, BCPS, BCIDP Infectious Diseases Clinical Pharmacist Phone: [phone] 10/09/2019, 11:49 AM

## 2019-10-09 NOTE — NC FL2 (Signed)
[place] LEVEL OF CARE SCREENING TOOL     IDENTIFICATION  Patient Name: Vanessa [name] Birthdate: May 21, 1956 Sex: female Admission Date (Current Location): 10/04/2019  [facility] and [place] Number:  Herbalist and Address:  [facility] [place], [place] [address] [place], [place] [address]      Provider Number: [id number]  Attending Physician Name and Address:  Cristal Ford, DO  Relative Name and Phone Number:  [name] [phone]    Current Level of Care: Hospital Recommended Level of Care: [place] Prior Approval Number:    Date Approved/Denied:   PASRR Number: [id number]  Discharge Plan: SNF    Current Diagnoses: Patient Active Problem List   Diagnosis Date Noted  . Anemia 10/05/2019  . Esophageal dysmotility 10/04/2019  . Pressure injury of skin 09/22/2019  . Endometrial adenocarcinoma ([place]) 09/21/2019  . Left humeral fracture 09/21/2019  . Iron deficiency anemia due to chronic blood loss 09/21/2019  . MSSA bacteremia 09/18/2019  . Postmenopausal bleeding 09/15/2019  . Thrombocytopenia ([place]) 09/15/2019  . Generalized pruritus 09/15/2019  . CKD (chronic kidney disease), stage III 09/15/2019  . Hyperuricemia 09/15/2019  . Hypokalemia 09/15/2019  . Symptomatic anemia 09/14/2019  . Non compliance w medication regimen 03/24/2015  . Dyslipidemia 03/24/2015  . DM type 2 (diabetes mellitus, type 2) ([place]) 05/23/2014  . HTN (hypertension) 05/23/2014    Orientation RESPIRATION BLADDER Height & Weight     Self, Time, Situation, Place  Normal Continent Weight: 205 lb 4 oz (93.1 kg) Height:     BEHAVIORAL SYMPTOMS/MOOD NEUROLOGICAL BOWEL NUTRITION STATUS      Continent Diet(See Discharge Summary)  AMBULATORY STATUS COMMUNICATION OF NEEDS Skin   Limited Assist(Rolling walker) Verbally Skin abrasions(rash upper back, fissure location foot; leg)                       Personal Care Assistance  Level of Assistance  Bathing Bathing Assistance: Independent Feeding assistance: Independent Dressing Assistance: Limited assistance     Functional Limitations Info  Hearing, Speech, Sight Sight Info: Impaired(Wears glasses) Hearing Info: Adequate Speech Info: Adequate    SPECIAL CARE FACTORS FREQUENCY  PT (By licensed PT)     PT Frequency: 5 X weekly              Contractures Contractures Info: Not present    Additional Factors Info  Code Status Code Status Info: Full             Current Medications (10/09/2019):  This is the current hospital active medication list Current Facility-Administered Medications  Medication Dose Route Frequency Provider Last Rate Last Admin  . acetaminophen (TYLENOL) tablet 650 mg  650 mg Oral Q6H PRN Clarene Essex, MD   650 mg at 10/09/19 0500   Or  . acetaminophen (TYLENOL) suppository 650 mg  650 mg Rectal Q6H PRN Clarene Essex, MD      . amLODipine (NORVASC) tablet 10 mg  10 mg Oral Daily Clarene Essex, MD   10 mg at 10/09/19 0958  . benzocaine (ORAJEL) 10 % mucosal gel 1 application  1 application Mouth/Throat QID PRN Clarene Essex, MD   1 application at 09/28/20 [phone]  . ceFAZolin (ANCEF) IVPB 2g/100 mL premix  2 g Intravenous Q8H Magod, Altamese Dilling, MD 200 mL/hr at 10/09/19 0500 2 g at 10/09/19 0500  . Chlorhexidine Gluconate Cloth 2 % PADS 6 each  6 each Topical Daily Clarene Essex, MD   6 each at  10/07/19 1014  . diphenhydrAMINE (BENADRYL) capsule 25 mg  25 mg Oral Q6H PRN Clarene Essex, MD   25 mg at 10/08/19 1331  . fentaNYL (SUBLIMAZE) injection 25-50 mcg  25-50 mcg Intravenous Q2H PRN Clarene Essex, MD      . hydrALAZINE (APRESOLINE) tablet 25 mg  25 mg Oral Q8H PRN Clarene Essex, MD   25 mg at 10/06/19 0942  . hydrALAZINE (APRESOLINE) tablet 50 mg  50 mg Oral Q8H Clarene Essex, MD   50 mg at 10/09/19 0501  . insulin aspart (novoLOG) injection 0-6 Units  0-6 Units Subcutaneous TID WC Magod, Altamese Dilling, MD      . labetalol (NORMODYNE) injection 10 mg  10 mg  Intravenous Q2H PRN Clarene Essex, MD   10 mg at 10/08/19 0450  . labetalol (NORMODYNE) tablet 200 mg  200 mg Oral TID Clarene Essex, MD   200 mg at 10/09/19 0958  . ondansetron (ZOFRAN) tablet 4 mg  4 mg Oral Q6H PRN Clarene Essex, MD       Or  . ondansetron [facility]) injection 4 mg  4 mg Intravenous Q6H PRN Clarene Essex, MD      . pantoprazole sodium (PROTONIX) 40 mg/20 mL oral suspension 40 mg  40 mg Oral Daily Kc, Ramesh, MD   40 mg at 10/09/19 0958  . sodium chloride flush (NS) 0.9 % injection 10-40 mL  10-40 mL Intracatheter Q12H Clarene Essex, MD   10 mL at 10/08/19 1000  . sodium chloride flush (NS) 0.9 % injection 10-40 mL  10-40 mL Intracatheter PRN Clarene Essex, MD         Discharge Medications: Please see discharge summary for a list of discharge medications.  Relevant Imaging Results:  Relevant Lab Results:   Additional Information SS# [id number]  Carolin Sicks, [place]

## 2019-10-09 NOTE — Progress Notes (Signed)
PROGRESS NOTE    Vanessa [name]  ZHG:[id number] DOB: 1955/09/11 DOA: 10/04/2019 PCP: Maren Reamer, MD (Inactive)   Brief Narrative:  HPI On 10/04/2019 by Dr. Bing Quarry Vanessa [name] is a 64 y.o. female with medical history significant for hypertension, type 2 diabetes mellitus, recent diagnosis endometrial cancer, and MSSA bacteremia currently on IV cefazolin, now presenting from her SNF for evaluation of low hemoglobin.  Patient reports that she is slightly more tired than usual, but denies any lightheadedness or chest pain.  She reports spending most of her days in bed for the past month.  She has not noted any vaginal bleeding since the recent hospital discharge, has not had any fevers or chills, has had dark stool but attributing this to iron, and denies any abdominal pain, nausea, vomiting, or constipation.  Hemoglobin was 7.0 when she was discharged 9 days ago, found to be in the mid 5 range at her SNF today, prompting transfer to the ED.  Assessment & Plan   Symptomatic anemia/GI bleed -Multifactorial -Patient was FOBT positive, also has chronic kidney disease and was noted to have vaginal bleeding on her last admission for which she had received 3 units of PRBC and was sent to SNF -Currently this admission, patient has received 2 units PRBC -No further vaginal bleeding -Gastroenterology consulted and appreciated -EGD on 10/08/2019: Tiny hiatal hernia present.  Biopsies taken to rule out H. pylori.  One small nonbleeding cratered duodenal ulcer with no stigmata of bleeding found duodenal bulb.  Recommendations for clear liquid diet.  Outpatient colonoscopy.  No NSAIDs.  Return to GI clinic as needed. -hemoglobin currently 7.6 (was 5.7 on admission)  Diabetes mellitus, type II -Hemoglobin A1c 5.7 on 09/15/2019 -Currently on sliding scale and CBG monitoring  Hyperlipidemia -Continue statin  Hypertensive urgency -Patient blood pressure has been poorly controlled -currently  on labetalol 200 mg 3 times daily, a lot of pain 10 mg daily, hydralazine 50 mg 3 times daily -BP improved  Dental disease -Orthopantogram on 09/22/2019 showed significant dental disease with areas of periapical lucency. -Patient will need to follow-up with dentistry  Chronic kidney disease, stage IIIb -Stable  MSSA bacteremia -MSSA grew from cultures on 09/17/2019 -09/19/2019 cultures showed no growth to date -No vegetations noted on TTE, TEE probe could not be passed -Patient will remain on cefazolin until October 31, 2019 -PICC line placed on 10/05/2019  Endometrial adenocarcinoma -Diagnosed earlier this month -As noted above, no further vaginal bleeding -Patient was started on Megace and has a gynecology/oncology referral  Esophageal dysmotility/stricture -Patient with dysphagia.  Unable to pass TEE probe. -GI has seen patient. -Currently on dysphagia 3 diet  Morbid obesity -BMI 40.08 -Discuss lifestyle modifications with PCP  Pressure ulcer gluteal cleft, stage II -noted on admission  DVT Prophylaxis  SCDs  Code Status: Full  Family Communication: None at bedside  Disposition Plan: Admitted from SNF.  Patient with symptomatic anemia, gastroenterology performed EGD on 10/08/2019.  Have placed patient on soft diet to see if she can tolerate.  Suspect discharged back to SNF on 10/10/2019.  Will obtain repeat Covid test.  Consultants Gastroenterology  Procedures  EGD  Antibiotics   Anti-infectives (From admission, onward)   Start     Dose/Rate Route Frequency Ordered Stop   10/06/19 1800  ceFAZolin (ANCEF) IVPB 2g/100 mL premix     2 g 200 mL/hr over 30 Minutes Intravenous Every 8 hours 10/06/19 1506     10/04/19 2200  ceFAZolin (ANCEF) IVPB 2g/100  mL premix  Status:  Discontinued     2 g 200 mL/hr over 30 Minutes Intravenous Every 12 hours 10/04/19 2017 10/06/19 1506      Subjective:   Vanessa [name] seen and examined today.  Patient with no complaints this morning.   Denies current abdominal pain, nausea or vomiting, chest pain, shortness of breath, dizziness or headache.  Objective:   Vitals:   10/08/19 1829 10/08/19 2021 10/09/19 0504 10/09/19 0958  BP: (!) 155/94 (!) 163/85 (!) 175/80 (!) 150/85  Pulse: 82 78 75 74  Resp:  15 15   Temp:  97.6 F (36.4 C) 98.5 F (36.9 C)   TempSrc:  Axillary Axillary   SpO2:  99% 99%   Weight:   93.1 kg     Intake/Output Summary (Last 24 hours) at 10/09/2019 1251 Last data filed at 10/08/2019 2000 Gross per 24 hour  Intake 100 ml  Output --  Net 100 ml   Filed Weights   10/06/19 0500 10/07/19 0551 10/09/19 0504  Weight: 91.2 kg 90.9 kg 93.1 kg    Exam  General: Well developed, well nourished, Chronically ill-appearing, NAD  HEENT: NCAT, mucous membranes moist.   Cardiovascular: S1 S2 auscultated, RRR, no murmur  Respiratory: Clear to auscultation bilaterally   Abdomen: Soft, nontender, nondistended, + bowel sounds  Extremities: warm dry without cyanosis clubbing or edema  Neuro: AAOx3, nonfocal  Psych: Pleasant, appropriate mood and affect   Data Reviewed: I have personally reviewed following labs and imaging studies  CBC: Recent Labs  Lab 10/05/19 0829 10/05/19 1750 10/06/19 1100 10/07/19 0603 10/08/19 0454 10/08/19 1622 10/09/19 0512  WBC 12.7*  --  14.3* 11.6* 8.3  --  8.1  HGB 8.1*   < > 7.4* 7.6* 7.0* 7.7* 7.6*  HCT 25.7*   < > 24.0* 25.0* 23.4* 26.4* 25.7*  MCV 88.9  --  91.3 92.3 94.0  --  93.5  PLT 366  --  326 350 309  --  342   < > = values in this interval not displayed.   Basic Metabolic Panel: Recent Labs  Lab 10/05/19 0829 10/06/19 1100 10/07/19 0603 10/08/19 0454 10/09/19 0512  NA 142 143 143 143 135  K 3.8 3.9 3.9 3.6 3.8  CL 112* 113* 112* 111 106  CO2 18* 21* 20* 22 20*  GLUCOSE 105* 111* 116* 100* 85  BUN 34* 28* 27* 23 20  CREATININE 1.54* 1.46* 1.53* 1.58* 1.62*  CALCIUM 8.4* 8.5* 8.3* 8.4* 8.2*   GFR: Estimated Creatinine Clearance: 35.3  mL/min (A) (by C-G formula based on SCr of 1.62 mg/dL (H)). Liver Function Tests: Recent Labs  Lab 10/04/19 1806 10/05/19 0829  AST 18 14*  ALT 6 7  ALKPHOS 95 73  BILITOT 0.5 0.2*  PROT 7.1 6.8  ALBUMIN 2.5* 2.4*   No results for input(s): LIPASE, AMYLASE in the last 168 hours. No results for input(s): AMMONIA in the last 168 hours. Coagulation Profile: No results for input(s): INR, PROTIME in the last 168 hours. Cardiac Enzymes: No results for input(s): CKTOTAL, CKMB, CKMBINDEX, TROPONINI in the last 168 hours. BNP (last 3 results) No results for input(s): PROBNP in the last 8760 hours. HbA1C: No results for input(s): HGBA1C in the last 72 hours. CBG: Recent Labs  Lab 10/08/19 0731 10/08/19 1754 10/08/19 2053 10/09/19 0736 10/09/19 1126  GLUCAP 92 85 70 83 89   Lipid Profile: No results for input(s): CHOL, HDL, LDLCALC, TRIG, CHOLHDL, LDLDIRECT in the last 72 hours.  Thyroid Function Tests: No results for input(s): TSH, T4TOTAL, FREET4, T3FREE, THYROIDAB in the last 72 hours. Anemia Panel: No results for input(s): VITAMINB12, FOLATE, FERRITIN, TIBC, IRON, RETICCTPCT in the last 72 hours. Urine analysis:    Component Value Date/Time   COLORURINE YELLOW 10/05/2019 1140   APPEARANCEUR HAZY (A) 10/05/2019 1140   LABSPEC 1.016 10/05/2019 1140   PHURINE 5.0 10/05/2019 1140   GLUCOSEU NEGATIVE 10/05/2019 1140   HGBUR SMALL (A) 10/05/2019 1140   BILIRUBINUR NEGATIVE 10/05/2019 1140   KETONESUR NEGATIVE 10/05/2019 1140   PROTEINUR 100 (A) 10/05/2019 1140   UROBILINOGEN 0.2 03/06/2014 1644   NITRITE NEGATIVE 10/05/2019 1140   LEUKOCYTESUR NEGATIVE 10/05/2019 1140   Sepsis Labs: @LABRCNTIP (procalcitonin:4,lacticidven:4)  ) Recent Results (from the past 240 hour(s))  SARS CORONAVIRUS 2 (TAT 6-24 HRS) Nasopharyngeal Nasopharyngeal Swab     Status: None   Collection Time: 10/04/19  8:17 PM   Specimen: Nasopharyngeal Swab  Result Value Ref Range Status   SARS  Coronavirus 2 NEGATIVE NEGATIVE Final    Comment: (NOTE) SARS-CoV-2 target nucleic acids are NOT DETECTED. The SARS-CoV-2 RNA is generally detectable in upper and lower respiratory specimens during the acute phase of infection. Negative results do not preclude SARS-CoV-2 infection, do not rule out co-infections with other pathogens, and should not be used as the sole basis for treatment or other patient management decisions. Negative results must be combined with clinical observations, patient history, and epidemiological information. The expected result is Negative. Fact Sheet for Patients: SugarRoll.be Fact Sheet for Healthcare Providers: https://www.woods-mathews.com/ This test is not yet approved or cleared by the [place] FDA and  has been authorized for detection and/or diagnosis of SARS-CoV-2 by FDA under an Emergency Use Authorization (EUA). This EUA will remain  in effect (meaning this test can be used) for the duration of the COVID-19 declaration under Section 56 4(b)(1) of the Act, 21 U.S.C. section 360bbb-3(b)(1), unless the authorization is terminated or revoked sooner. Performed at [facility], [place] [address] [place] [place], [place] [address]   MRSA PCR Screening     Status: None   Collection Time: 10/07/19  3:10 AM   Specimen: Nasal Mucosa; Nasopharyngeal  Result Value Ref Range Status   MRSA by PCR NEGATIVE NEGATIVE Final    Comment:        The GeneXpert MRSA Assay (FDA approved for NASAL specimens only), is one component of a comprehensive MRSA colonization surveillance program. It is not intended to diagnose MRSA infection nor to guide or monitor treatment for MRSA infections. Performed at [facility], [place] [address] [place], [place] [address]       Radiology Studies: No results found.   Scheduled Meds: . amLODipine  10 mg Oral Daily  . Chlorhexidine Gluconate Cloth  6 each Topical Daily  .  hydrALAZINE  50 mg Oral Q8H  . insulin aspart  0-6 Units Subcutaneous TID WC  . labetalol  200 mg Oral TID  . pantoprazole sodium  40 mg Oral Daily  . sodium chloride flush  10-40 mL Intracatheter Q12H   Continuous Infusions: .  ceFAZolin (ANCEF) IV 2 g (10/09/19 0500)     LOS: 4 days   Time Spent in minutes   45 minutes  Armanie Martine D.O. on 10/09/2019 at 12:51 PM  Between 7am to 7pm - Please see pager noted on [email or web address]  After 7pm go to [email or web address]  And look for the night coverage person covering for me after hours  [facility]  Group Office  [phone]

## 2019-10-09 NOTE — Progress Notes (Signed)
[facility] Progress Note  [place] Mielke 64 y.o. Sep 03, 1955 Patient is a 64 year old female presenting with dysphagia, melena, and anemia who underwent EGD yesterday.  Subjective: Patient states she is feeling fine this morning.  She has no abdominal pain, chest pain, nausea, vomiting.  She denies seeing any black or bloody stools since EGD yesterday.  She endorses having an appetite and is interested in advancing her diet.  Objective: Vital signs: Vitals:   10/09/19 1304 10/09/19 1403  BP: (!) 181/92 (!) 167/84  Pulse:  79  Resp:    Temp:  98.7 F (37.1 C)  SpO2:  100%    Physical Exam: Gen: alert, no acute distress  HEENT: anicteric sclera CV: RRR Chest: CTA B Abd: Soft, nontender, nondistended.  Normoactive bowel sounds.  No pain or peritoneal signs.  Ext: no edema  Lab Results: Recent Labs    10/08/19 0454 10/09/19 0512  NA 143 135  K 3.6 3.8  CL 111 106  CO2 22 20*  GLUCOSE 100* 85  BUN 23 20  CREATININE 1.58* 1.62*  CALCIUM 8.4* 8.2*   No results for input(s): AST, ALT, ALKPHOS, BILITOT, PROT, ALBUMIN in the last 72 hours. Recent Labs    10/08/19 0454 10/08/19 0454 10/08/19 1622 10/09/19 0512  WBC 8.3  --   --  8.1  HGB 7.0*   < > 7.7* 7.6*  HCT 23.4*   < > 26.4* 25.7*  MCV 94.0  --   --  93.5  PLT 309  --   --  342   < > = values in this interval not displayed.      Assessment/Plan: Patient is a 64 year old female presenting with dysphagia, melena, and anemia who underwent EGD yesterday.  EGD showed normal stomach (biopsies taken to r/o H pylori, path pending), one small, nonbleeding cratered duodenal ulcer with no stigmata of bleeding, and a tiny hiatal hernia.  Patient's hemoglobin is currently stable (7.6 today).  Await H. pylori results.  If positive, recommend treatment.  Continue Protonix 40 mg twice daily for 3 months.   Avoid aspirin, ibuprofen, naproxen, or other non-steroidal anti-inflammatory drugs long-term unless  absolutely needed -if used, continue PPI to prevent ulcer recurrence.  Recommend outpatient colonoscopy.  If patient agrees to complete colonoscopy on an outpatient basis, diet can be advanced as tolerated.  [facility] will sign off.  Please contact us if we can be of any further assistance during this hospital stay.  Salley Slaughter 10/09/2019, 2:53 PM  Questions please call [phone]

## 2019-10-09 NOTE — Care Management Important Message (Signed)
Important Message  Patient Details  Name: Vanessa [name] MRN: [id number] Date of Birth: 1955-08-08   Medicare Important Message Given:  Yes     Shelda Altes 10/09/2019, 10:27 AM

## 2019-10-10 LAB — GLUCOSE, CAPILLARY
Glucose-Capillary: 110 mg/dL — ABNORMAL HIGH (ref 70–99)
Glucose-Capillary: 115 mg/dL — ABNORMAL HIGH (ref 70–99)
Glucose-Capillary: 135 mg/dL — ABNORMAL HIGH (ref 70–99)
Glucose-Capillary: 118 mg/dL — ABNORMAL HIGH (ref 70–99)

## 2019-10-10 LAB — HEMOGLOBIN AND HEMATOCRIT, BLOOD
HCT: 23.7 % — ABNORMAL LOW (ref 36.0–46.0)
HCT: 24.3 % — ABNORMAL LOW (ref 36.0–46.0)
Hemoglobin: 7 g/dL — ABNORMAL LOW (ref 12.0–15.0)
Hemoglobin: 7.5 g/dL — ABNORMAL LOW (ref 12.0–15.0)

## 2019-10-10 LAB — SURGICAL PATHOLOGY

## 2019-10-10 MED ORDER — HYDRALAZINE HCL 50 MG PO TABS: 50.0000 mg | ORAL_TABLET | Freq: Three times a day (TID) | ORAL | Status: DC

## 2019-10-10 MED ORDER — AMLODIPINE BESYLATE 10 MG PO TABS: 10.0000 mg | ORAL_TABLET | Freq: Every day | ORAL | Status: DC

## 2019-10-10 MED ORDER — PANTOPRAZOLE SODIUM 40 MG PO PACK: 40.0000 mg | PACK | Freq: Every day | ORAL | Status: DC

## 2019-10-10 MED ORDER — CEFAZOLIN SODIUM-DEXTROSE 2-4 GM/100ML-% IV SOLN: 2.0000 g | Freq: Three times a day (TID) | INTRAVENOUS | Status: DC

## 2019-10-10 MED ORDER — CHLORHEXIDINE GLUCONATE CLOTH 2 % EX PADS: 6.0000 | MEDICATED_PAD | Freq: Every day | CUTANEOUS | Status: DC

## 2019-10-10 MED ORDER — CEFAZOLIN IV (FOR PTA / DISCHARGE USE ONLY): 2.0000 g | Freq: Three times a day (TID) | INTRAVENOUS | 0 refills | Status: DC

## 2019-10-10 MED ORDER — OXYCODONE HCL 5 MG PO TABS: 5.0000 mg | ORAL_TABLET | Freq: Four times a day (QID) | ORAL | 0 refills | Status: DC | PRN

## 2019-10-10 NOTE — Plan of Care (Signed)
Mod assist adls. Report given to staff at [facility].

## 2019-10-10 NOTE — TOC Progression Note (Signed)
Transition of Care [facility]) - Progression Note    Patient Details  Name: Vanessa [name] MRN: [id number] Date of Birth: [birth year]  Transition of Care [facility] CM/SW [place], [place] Phone Number: 10/10/2019, 11:27 AM  Clinical Narrative:     CSW waiting on insurance authorization for SNF.  Thurmond Butts, MSW, [place] Clinical Social Worker   Expected Discharge Plan: Skilled Nursing Facility Barriers to Discharge: Continued Medical Work up, Ship broker  Expected Discharge Plan and Services Expected Discharge Plan: [place]       Living arrangements for the past 2 months: Apartment                                       Social Determinants of Health (SDOH) Interventions    Readmission Risk Interventions Readmission Risk Prevention Plan 09/25/2019  Transportation Screening Complete  PCP or Specialist Appt within 3-5 Days Not Complete  Not Complete comments plan for SNF  [place] or [place] Complete  Social Work Consult for [place] Planning/Counseling Complete  Palliative Care Screening Not Applicable  Medication Review (RN Care Manager) Referral to Pharmacy  Some recent data might be hidden

## 2019-10-10 NOTE — Discharge Summary (Signed)
Physician Discharge Summary  Vanessa [name] WLN:[id number] DOB: 1956-03-04 DOA: 10/04/2019  PCP: Vanessa Reamer, MD (Inactive)  Admit date: 10/04/2019 Discharge date: 10/10/2019  Time spent: 45 minutes  Recommendations for Outpatient Follow-up:  Patient will be discharged to skilled nursing facility, continue physical and occupational therapy.  Patient will need to follow up with primary care provider within one week of discharge. Follow up with gastroenterology. Follow up with dentistry.  Patient should continue medications as prescribed.  Patient should follow a soft diet.   Discharge Diagnoses:  Symptomatic anemia/GI bleed Diabetes mellitus, type II Hyperlipidemia Hypertensive urgency Dental disease Chronic kidney disease, stage IIIb MSSA bacteremia Endometrial adenocarcinoma Esophageal dysmotility/stricture Morbid obesity Pressure ulcer gluteal cleft, stage II  Discharge Condition: Stable  Diet recommendation: soft  Filed Weights   10/06/19 0500 10/07/19 0551 10/09/19 0504  Weight: 91.2 kg 90.9 kg 93.1 kg    History of present illness:  On 10/04/2019 by Dr. Bing [name] Vanessa [name] a 64 y.o.femalewith medical history significant forhypertension, type 2 diabetes mellitus, recent diagnosis endometrial cancer, and MSSA bacteremia currently on IV cefazolin, now presenting from her SNF for evaluation of low hemoglobin. Patient reports that she is slightly more tired than usual, but denies any lightheadedness or chest pain. She reports spending most of her days in bed for the past month.She has not noted any vaginal bleeding since the recent hospital discharge, has not had any fevers or chills, has had dark stool but attributing this to iron, and denies any abdominal pain, nausea, vomiting, or constipation. Hemoglobin was 7.0 when she was discharged 9 days ago, found to be in the mid 5 range at her SNF today, prompting transfer to the ED.  Hospital Course:    Symptomatic anemia/GI bleed -Multifactorial -Patient was FOBT positive, also has chronic kidney disease and was noted to have vaginal bleeding on her last admission for which she had received 3 units of PRBC and was sent to SNF -Currently this admission, patient has received 2 units PRBC -No further vaginal bleeding -Gastroenterology consulted and appreciated -EGD on 10/08/2019: Tiny hiatal hernia present.  Biopsies taken to rule out H. pylori.  One small nonbleeding cratered duodenal ulcer with no stigmata of bleeding found duodenal bulb.  Recommendations for clear liquid diet.  Outpatient colonoscopy.  No NSAIDs.  Return to GI clinic as needed. -Biopsy negative for H polyri -hemoglobin currently 7.5 -per Dr. Watt Climes, continue PPI daily for 3 months  Diabetes mellitus, type II -Hemoglobin A1c 5.7 on 09/15/2019 -Currently on sliding scale and CBG monitoring  Hyperlipidemia -Continue statin  Hypertensive urgency -Patient blood pressure has been poorly controlled -currently on labetalol 200 mg 3 times daily, a lot of pain 10 mg daily, hydralazine 50 mg 3 times daily -BP improved  Dental disease -Orthopantogram on 09/22/2019 showed significant dental disease with areas of periapical lucency. -Patient will need to follow-up with dentistry  Chronic kidney disease, stage IIIb -Stable  MSSA bacteremia -MSSA grew from cultures on 09/17/2019 -09/19/2019 cultures showed no growth to date -No vegetations noted on TTE, TEE probe could not be passed -Patient will remain on cefazolin until October 31, 2019 -PICC line placed on 10/05/2019  Endometrial adenocarcinoma -Diagnosed earlier this month -As noted above, no further vaginal bleeding -Patient was started on Megace and has a gynecology/oncology referral  Esophageal dysmotility/stricture -Patient with dysphagia.  Unable to pass TEE probe. -GI has seen patient. -Currently on dysphagia 3 diet  Morbid obesity -BMI 40.08 -Discuss  lifestyle modifications with PCP  Pressure ulcer gluteal cleft, stage II -noted on admission  Consultants Gastroenterology  Procedures  EGD   Discharge Exam: Vitals:   10/10/19 0400 10/10/19 1300  BP: (!) 154/84 (!) 178/87  Pulse: 79 79  Resp: 16 20  Temp: 98.3 F (36.8 C) 97.8 F (36.6 C)  SpO2: 99% 100%     General: Well developed, well nourished, NAD, appears stated age  HEENT: NCAT, mucous membranes moist.  Cardiovascular: S1 S2 auscultated, RRR< no murmur  Respiratory: Clear to auscultation bilaterally   Abdomen: Soft, nontender, nondistended, + bowel sounds  Extremities: warm dry without cyanosis clubbing or edema  Neuro: AAOx3, nonfocal  Psych: Pleasant, appropriate mood and affect  Discharge Instructions Discharge Instructions    Discharge instructions   Complete by: As directed    Patient will be discharged to skilled nursing facility, continue physical and occupational therapy.  Patient will need to follow up with primary care provider within one week of discharge. Follow up with gastroenterology. Follow up with dentistry. Patient should continue medications as prescribed.  Patient should follow a soft diet.   Home infusion instructions   Complete by: As directed    Instructions: Flushing of vascular access device: 0.9% NaCl pre/post medication administration and prn patency; Heparin 100 u/ml, 84m for implanted ports and Heparin 10u/ml, 525mfor all other central venous catheters.     Allergies as of 10/10/2019   No Known Allergies     Medication List    STOP taking these medications   diclofenac Sodium 1 % Gel Commonly known as: VOLTAREN     TAKE these medications   acetaminophen 500 MG tablet Commonly known as: TYLENOL Take 500 mg by mouth every 6 (six) hours as needed for moderate pain.   amLODipine 10 MG tablet Commonly known as: NORVASC Take 1 tablet (10 mg total) by mouth daily. Start taking on: October 11, 2019   Bayer Contour  Monitor w/Device Kit Use as directed for 3 times daily testing of blood glucose. E11.9   benzocaine 10 % mucosal gel Commonly known as: ORAJEL Use as directed 1 application in the mouth or throat 4 (four) times daily as needed for mouth pain.   ceFAZolin  IVPB Commonly known as: ANCEF Inject 2 g into the vein every 8 (eight) hours for 22 days. Indication:  MSSA bacteremia Last Day of Therapy:  10/31/19 Labs - Once weekly:  CBC/D and BMP, Labs - Every other week:  ESR and CRP What changed:   when to take this  additional instructions   ceFAZolin 2-4 GM/100ML-% IVPB Commonly known as: ANCEF Inject 100 mLs (2 g total) into the vein every 8 (eight) hours.   Chlorhexidine Gluconate Cloth 2 % Pads Apply 6 each topically daily.   ferrous sulfate 325 (65 FE) MG tablet Take 1 tablet (325 mg total) by mouth 2 (two) times daily with a meal.   glucose blood test strip Commonly known as: BaEstate manager/land agentse as instructed for 3 times daily testing of blood glucose. E11.9   hydrALAZINE 50 MG tablet Commonly known as: APRESOLINE Take 1 tablet (50 mg total) by mouth every 8 (eight) hours.   INSULIN SYRINGE 1CC/29G 29G X 1/2" 1 ML Misc Insulin syringes   labetalol 200 MG tablet Commonly known as: NORMODYNE Take 1 tablet (200 mg total) by mouth 3 (three) times daily.   megestrol 40 MG tablet Commonly known as: MEGACE Take 2 tablets (80 mg total) by mouth 2 (two) times daily.   onetouch  ultrasoft lancets Use as instructed   oxyCODONE 5 MG immediate release tablet Commonly known as: Oxy IR/ROXICODONE Take 1 tablet (5 mg total) by mouth every 6 (six) hours as needed for moderate pain.   pantoprazole sodium 40 mg/20 mL Pack Commonly known as: PROTONIX Take 20 mLs (40 mg total) by mouth daily.   polyethylene glycol 17 g packet Commonly known as: MIRALAX / GLYCOLAX Take 17 g by mouth daily.            Home Infusion Instuctions  (From admission, onward)         Start      Ordered   10/10/19 0000  Home infusion instructions    Question:  Instructions  Answer:  Flushing of vascular access device: 0.9% NaCl pre/post medication administration and prn patency; Heparin 100 u/ml, 4m for implanted ports and Heparin 10u/ml, 523mfor all other central venous catheters.   10/10/19 1425         No Known Allergies    The results of significant diagnostics from this hospitalization (including imaging, microbiology, ancillary and laboratory) are listed below for reference.    Significant Diagnostic Studies: CT ABDOMEN PELVIS WO CONTRAST  Result Date: 09/15/2019 CLINICAL DATA:  Nausea and vomiting.  Concern for pelvic malignancy. EXAM: CT ABDOMEN AND PELVIS WITHOUT CONTRAST TECHNIQUE: Multidetector CT imaging of the abdomen and pelvis was performed following the standard protocol without IV contrast. COMPARISON:  September 14, 2019 FINDINGS: Lower chest: There is a small right-sided pleural effusion.The heart size is mildly enlarged. The intracardiac blood pool is hypodense relative to the adjacent myocardium consistent with anemia. Hepatobiliary: The liver is normal. Normal gallbladder.There is no biliary ductal dilation. Pancreas: Normal contours without ductal dilatation. No peripancreatic fluid collection. Spleen: No splenic laceration or hematoma. Adrenals/Urinary Tract: --Adrenal glands: No adrenal hemorrhage. --Right kidney/ureter: No hydronephrosis or perinephric hematoma. --Left kidney/ureter: The left kidney is atrophic. This may be secondary to a distal left ureteral stone measuring approximately 7 mm (axial series 3, image 58), however the ureter at this level is difficult to follow. --Urinary bladder: Unremarkable. Stomach/Bowel: --Stomach/Duodenum: No hiatal hernia or other gastric abnormality. Normal duodenal course and caliber. --Small bowel: No dilatation or inflammation. --Colon: No focal abnormality. --Appendix: Normal. Vascular/Lymphatic: Normal course and caliber  of the major abdominal vessels. --No retroperitoneal lymphadenopathy. --No mesenteric lymphadenopathy. --No pelvic or inguinal lymphadenopathy. Reproductive: The uterus is not well evaluated in the absence of IV contrast. Other: There is a trace volume of ascites. The abdominal wall is normal. Musculoskeletal. No acute displaced fractures. IMPRESSION: 1. Suboptimal evaluation of the uterus in the absence of IV contrast. See separate recent ultrasound for further evaluation and recommendations. 2. Anemia. 3. Small right-sided pleural effusion. 4. Trace abdominal ascites. 5. Atrophic left kidney which may be secondary to a distal left ureteral stone measuring approximately 7 mm. The left kidney is likely nonfunctional as there is no left-sided hydronephrosis. Electronically Signed   By: ChConstance Holster.D.   On: 09/15/2019 02:16   DG Orthopantogram  Result Date: 09/22/2019 CLINICAL DATA:  Tooth pain. EXAM: ORTHOPANTOGRAM/PANORAMIC COMPARISON:  None. FINDINGS: Multiple missing teeth. Periapical lucency adjacent to the root of the remaining left mandibular lateral incisor. Periapical lucency adjacent to the left maxillary incisor and left maxillary second molar. Significant resorption of the left maxillary first molar and a portion of a residual last right maxillary molar. No bone lesions. IMPRESSION: 1. Significant dental disease with areas of periapical lucency as detailed. Electronically Signed   By:  Lajean Manes M.D.   On: 09/22/2019 10:52   DG Forearm Left  Result Date: 09/17/2019 CLINICAL DATA:  Recent fall with arm pain, initial encounter EXAM: LEFT FOREARM - 2 VIEW COMPARISON:  None. FINDINGS: There is no evidence of fracture or other focal bone lesions. Soft tissues are unremarkable. IMPRESSION: No acute abnormality noted.  Mild soft tissue swelling is seen. Electronically Signed   By: Inez Catalina M.D.   On: 09/17/2019 16:27   CT HEAD WO CONTRAST  Result Date: 09/17/2019 CLINICAL DATA:   Encephalopathy EXAM: CT HEAD WITHOUT CONTRAST TECHNIQUE: Contiguous axial images were obtained from the base of the skull through the vertex without intravenous contrast. COMPARISON:  None. FINDINGS: Brain: Mild atrophy. Hypodensity right frontal corona radiata consistent with chronic ischemia. Patchy white matter hypodensity bilaterally appears chronic. Negative for acute infarct, hemorrhage, mass. Vascular: Negative for hyperdense vessel Skull: Negative Sinuses/Orbits: Mild mucosal edema paranasal sinuses. Negative orbit. Soft tissue swelling of the left eyelid consistent with a dermal lesion. Other: None IMPRESSION: No acute abnormality. Mild atrophy and mild chronic microvascular ischemic change. Electronically Signed   By: Franchot Gallo M.D.   On: 09/17/2019 17:21   US PELVIS (TRANSABDOMINAL ONLY)  Result Date: 09/14/2019 CLINICAL DATA:  Postmenopausal vaginal bleeding. EXAM: TRANSABDOMINAL ULTRASOUND OF PELVIS TECHNIQUE: Transabdominal ultrasound examination of the pelvis was performed including evaluation of the uterus, ovaries, adnexal regions, and pelvic cul-de-sac. Attempt was made at transvaginal exam, however patient did not tolerate transvaginal probe. COMPARISON:  None. FINDINGS: Uterus Measurements: 10.0 x 5.0 x 6.4 cm = volume: 164 mL. No fibroids or other mass visualized. Endometrium Poorly defined. Endometrium appears mildly thickened, tentatively measured at 8 mm. Right ovary Not visualized.  No adnexal mass. Left ovary Not visualized.  No adnexal mass. Other findings:  No abnormal free fluid. IMPRESSION: Endometrium is poorly defined, however appears thickened measuring approximately 8-9 mm. In the setting of post-menopausal bleeding, endometrial sampling is indicated to exclude carcinoma. If results are benign, sonohysterogram should be considered for focal lesion work-up. (Ref: Radiological Reasoning: Algorithmic Workup of Abnormal Vaginal Bleeding with Endovaginal Sonography and  Sonohysterography. AJR 2008; 315:[id number]) Electronically Signed   By: Keith Rake M.D.   On: 09/14/2019 23:23   DG Chest Port 1 View  Result Date: 09/17/2019 CLINICAL DATA:  Weakness and leukocytosis. EXAM: PORTABLE CHEST 1 VIEW COMPARISON:  Chest x-ray 09/14/2019 FINDINGS: The heart is mildly enlarged but stable. Stable mild tortuosity of the thoracic aorta. Low lung volumes with vascular crowding and streaky basilar atelectasis. No infiltrates or effusions. No pulmonary lesions. The bony thorax is intact. Stable healing left humeral neck fracture. IMPRESSION: Low lung volumes with vascular crowding and streaky basilar atelectasis. Electronically Signed   By: Marijo Sanes M.D.   On: 09/17/2019 09:08   DG Chest Port 1 View  Result Date: 09/14/2019 CLINICAL DATA:  Shortness of breath. EXAM: PORTABLE CHEST 1 VIEW COMPARISON:  03/06/2014 FINDINGS: Enlarged cardiac silhouette with an interval significant increase in size. Clear lungs. Interval prominence of the pulmonary vasculature in the upper lung zones. No pleural fluid. Thoracic spine degenerative changes. Interval probable old, healed left humeral neck fracture. Mild right shoulder degenerative changes. IMPRESSION: Interval cardiomegaly and pulmonary vascular congestion in the upper lung zones. Electronically Signed   By: Claudie Revering M.D.   On: 09/14/2019 16:58   DG Shoulder Left Port  Result Date: 09/17/2019 CLINICAL DATA:  Shoulder pain EXAM: LEFT SHOULDER COMPARISON:  09/14/2019 FINDINGS: Proximal left humeral fracture is noted with  involvement primarily the surgical neck. Callus formation is noted consistent with healing. No acute fracture is seen. No other focal abnormality is noted. IMPRESSION: Healing proximal left humeral fracture Electronically Signed   By: Inez Catalina M.D.   On: 09/17/2019 16:31   ECHOCARDIOGRAM COMPLETE  Result Date: 09/18/2019    ECHOCARDIOGRAM REPORT   Patient Name:   DELANEE XIN Salvetti Date of Exam: 09/18/2019 Medical  Rec #:  [id number]       Height:       60.0 in Accession #:    [id number]      Weight:       166.0 lb Date of Birth:  [birth year]       BSA:          1.725 m Patient Age:    51 years        BP:           162/80 mmHg Patient Gender: F               HR:           80 bpm. Exam Location:  Inpatient Procedure: 2D Echo, Cardiac Doppler and Color Doppler Indications:    Bacteremia 790.7 / R78.81  History:        Patient has no prior history of Echocardiogram examinations.                 Signs/Symptoms:Murmur; Risk Factors:Hypertension and Diabetes.  Sonographer:    Tiffany Dance Referring Phys: [id number] [place]  1. Left ventricular ejection fraction, by estimation, is 55 to 60%. The left ventricle has normal function. The left ventricle has no regional wall motion abnormalities. Left ventricular diastolic parameters are consistent with Grade II diastolic dysfunction (pseudonormalization). Elevated left ventricular end-diastolic pressure.  2. Right ventricular systolic function is normal. The right ventricular size is normal. There is moderately elevated pulmonary artery systolic pressure. The estimated right ventricular systolic pressure is 61.6 mmHg.  3. Left atrial size was moderately dilated.  4. The mitral valve is abnormal. Mild to moderate mitral valve regurgitation.  5. The aortic valve is tricuspid. Aortic valve regurgitation is trivial. Mild aortic valve sclerosis is present, with no evidence of aortic valve stenosis.  6. The inferior vena cava is dilated in size with <50% respiratory variability, suggesting right atrial pressure of 15 mmHg. Conclusion(s)/Recommendation(s): No evidence of valvular vegetations on this transthoracic echocardiogram. Would recommend a transesophageal echocardiogram to exclude infective endocarditis if clinically indicated. FINDINGS  Left Ventricle: Left ventricular ejection fraction, by estimation, is 55 to 60%. The left ventricle has normal function. The left  ventricle has no regional wall motion abnormalities. The left ventricular internal cavity size was normal in size. There is  no left ventricular hypertrophy. Left ventricular diastolic parameters are consistent with Grade II diastolic dysfunction (pseudonormalization). Elevated left ventricular end-diastolic pressure. Right Ventricle: The right ventricular size is normal. No increase in right ventricular wall thickness. Right ventricular systolic function is normal. There is moderately elevated pulmonary artery systolic pressure. The tricuspid regurgitant velocity is 3.26 m/s, and with an assumed right atrial pressure of 15 mmHg, the estimated right ventricular systolic pressure is 83.7 mmHg. Left Atrium: Left atrial size was moderately dilated. Right Atrium: Right atrial size was normal in size. Pericardium: There is no evidence of pericardial effusion. Mitral Valve: The mitral valve is abnormal. There is mild thickening of the mitral valve leaflet(s). Mild to moderate mitral valve regurgitation. Tricuspid Valve: The tricuspid valve is grossly normal.  Tricuspid valve regurgitation is mild. Aortic Valve: The aortic valve is tricuspid. Aortic valve regurgitation is trivial. Mild aortic valve sclerosis is present, with no evidence of aortic valve stenosis. Pulmonic Valve: The pulmonic valve was grossly normal. Pulmonic valve regurgitation is trivial. Aorta: The aortic root, ascending aorta, aortic arch and descending aorta are all structurally normal, with no evidence of dilitation or obstruction. Venous: The inferior vena cava is dilated in size with less than 50% respiratory variability, suggesting right atrial pressure of 15 mmHg. IAS/Shunts: No atrial level shunt detected by color flow Doppler.  LEFT VENTRICLE PLAX 2D LVIDd:         4.22 cm  Diastology LVIDs:         2.89 cm  LV e' lateral:   6.31 cm/s LV PW:         0.94 cm  LV E/e' lateral: 20.1 LV IVS:        0.90 cm  LV e' medial:    4.79 cm/s LVOT diam:      2.00 cm  LV E/e' medial:  26.5 LV SV:         77 LV SV Index:   45 LVOT Area:     3.14 cm  RIGHT VENTRICLE             IVC RV Basal diam:  2.57 cm     IVC diam: 2.24 cm RV S prime:     11.10 cm/s TAPSE (M-mode): 2.0 cm LEFT ATRIUM             Index       RIGHT ATRIUM           Index LA diam:        3.30 cm 1.91 cm/m  RA Area:     18.30 cm LA Vol (A2C):   62.4 ml 36.18 ml/m RA Volume:   52.10 ml  30.21 ml/m LA Vol (A4C):   71.5 ml 41.46 ml/m LA Biplane Vol: 72.0 ml 41.75 ml/m  AORTIC VALVE LVOT Vmax:   121.00 cm/s LVOT Vmean:  77.500 cm/s LVOT VTI:    0.245 m  AORTA Ao Root diam: 3.10 cm Ao Asc diam:  3.30 cm MITRAL VALVE                TRICUSPID VALVE MV Area (PHT): 4.06 cm     TR Peak grad:   42.5 mmHg MV Decel Time: 187 msec     TR Vmax:        326.00 cm/s MV E velocity: 127.00 cm/s MV A velocity: 85.90 cm/s   SHUNTS MV E/A ratio:  1.48         Systemic VTI:  0.24 m                             Systemic Diam: 2.00 cm Lyman Bishop MD Electronically signed by Lyman Bishop MD Signature Date/Time: 09/18/2019/6:01:27 PM    Final    VAS [place] LOWER EXTREMITY VENOUS (DVT)  Result Date: 09/16/2019  Lower Venous DVTStudy Indications: Swelling, and Pain.  Comparison Study: no prior Performing Technologist: Abram Sander RVS  Examination Guidelines: A complete evaluation includes B-mode imaging, spectral Doppler, color Doppler, and power Doppler as needed of all accessible portions of each vessel. Bilateral testing is considered an integral part of a complete examination. Limited examinations for reoccurring indications may be performed as noted. The reflux portion of the exam is performed with the  patient in reverse Trendelenburg.  +---------+---------------+---------+-----------+----------+--------------+  RIGHT     Compressibility Phasicity Spontaneity Properties Thrombus Aging  +---------+---------------+---------+-----------+----------+--------------+  CFV       Full            Yes       Yes                                     +---------+---------------+---------+-----------+----------+--------------+  SFJ       Full                                                             +---------+---------------+---------+-----------+----------+--------------+  FV Prox   Full                                                             +---------+---------------+---------+-----------+----------+--------------+  FV Mid    Full                                                             +---------+---------------+---------+-----------+----------+--------------+  FV Distal Full                                                             +---------+---------------+---------+-----------+----------+--------------+  PFV       Full                                                             +---------+---------------+---------+-----------+----------+--------------+  POP       Full            Yes       Yes                                    +---------+---------------+---------+-----------+----------+--------------+  PTV       Full                                                             +---------+---------------+---------+-----------+----------+--------------+  PERO  Not visualized  +---------+---------------+---------+-----------+----------+--------------+   +---------+---------------+---------+-----------+----------+--------------+  LEFT      Compressibility Phasicity Spontaneity Properties Thrombus Aging  +---------+---------------+---------+-----------+----------+--------------+  CFV       Full            Yes       Yes                                    +---------+---------------+---------+-----------+----------+--------------+  SFJ       Full                                                             +---------+---------------+---------+-----------+----------+--------------+  FV Prox   Full                                                              +---------+---------------+---------+-----------+----------+--------------+  FV Mid    Full                                                             +---------+---------------+---------+-----------+----------+--------------+  FV Distal Full                                                             +---------+---------------+---------+-----------+----------+--------------+  PFV       Full                                                             +---------+---------------+---------+-----------+----------+--------------+  POP       Full            Yes       Yes                                    +---------+---------------+---------+-----------+----------+--------------+  PTV                                                        Not visualized  +---------+---------------+---------+-----------+----------+--------------+  PERO  Not visualized  +---------+---------------+---------+-----------+----------+--------------+     Summary: RIGHT: - There is no evidence of deep vein thrombosis in the lower extremity.  - No cystic structure found in the popliteal fossa.  LEFT: - There is no evidence of deep vein thrombosis in the lower extremity. However, portions of this examination were limited- see technologist comments above.  - No cystic structure found in the popliteal fossa.  *See table(s) above for measurements and observations. Electronically signed by Ruta Hinds MD on 09/16/2019 at 3:22:59 PM.    Final    VAS [place] UPPER EXTREMITY VENOUS DUPLEX  Result Date: 09/21/2019 UPPER VENOUS STUDY  Indications: Edema Limitations: Patient positioning. Comparison Study: no prior Performing Technologist: Abram Sander RVS  Examination Guidelines: A complete evaluation includes B-mode imaging, spectral Doppler, color Doppler, and power Doppler as needed of all accessible portions of each vessel. Bilateral testing is considered an integral part of a complete examination.  Limited examinations for reoccurring indications may be performed as noted.  Left Findings: +----------+------------+---------+-----------+----------+-----------------+  LEFT       Compressible Phasicity Spontaneous Properties      Summary       +----------+------------+---------+-----------+----------+-----------------+  IJV            Full        Yes        Yes                                   +----------+------------+---------+-----------+----------+-----------------+  Subclavian     Full        Yes        Yes                                   +----------+------------+---------+-----------+----------+-----------------+  Axillary       Full        Yes        Yes                                   +----------+------------+---------+-----------+----------+-----------------+  Brachial       Full        Yes        Yes                                   +----------+------------+---------+-----------+----------+-----------------+  Radial         Full                                                         +----------+------------+---------+-----------+----------+-----------------+  Ulnar          Full                                                         +----------+------------+---------+-----------+----------+-----------------+  Cephalic       None  Age Indeterminate  +----------+------------+---------+-----------+----------+-----------------+  Basilic        Full                                                         +----------+------------+---------+-----------+----------+-----------------+  Summary:  Left: No evidence of deep vein thrombosis in the upper extremity. Findings consistent with age indeterminate superficial vein thrombosis involving the left cephalic vein.  *See table(s) above for measurements and observations.  Diagnosing physician: Harold Barban MD Electronically signed by Harold Barban MD on 09/21/2019 at 7:07:45 PM.    Final    [place] EKG SITE RITE  Result Date:  10/05/2019 If Site Rite image not attached, placement could not be confirmed due to current cardiac rhythm.  [place] EKG SITE RITE  Result Date: 09/22/2019 If Site Rite image not attached, placement could not be confirmed due to current cardiac rhythm.  DG ESOPHAGUS W SINGLE CM (SOL OR THIN BA)  Result Date: 09/24/2019 CLINICAL DATA:  64 year old female with history of unsuccessful TEE yesterday. Nausea and vomiting intermittently. EXAM: ESOPHOGRAM/BARIUM SWALLOW TECHNIQUE: Single contrast examination was performed using thin barium or water soluble. FLUOROSCOPY TIME:  Fluoroscopy Time:  48 seconds Radiation Exposure Index (if provided by the fluoroscopic device): 3.8 mGy Number of Acquired Spot Images: 0 COMPARISON:  None. FINDINGS: Limited single contrast study was performed due to lack of patient mobility. Within the limitations of today's examination, there was no evidence of esophageal mass, esophageal ring or hiatal hernia. The distal esophagus shortly before the gastroesophageal junction never fully distended during the examination. Failure to normally propagate primary peristaltic waves was noted, with occasional tertiary contractions. A barium tablet was not administered as the patient cannot tolerate swallowing pills. IMPRESSION: 1. 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 2. Failure to fully distend the distal esophagus shortly before the gastroesophageal junction. The possibility of a distal esophageal stricture is not entirely excluded. Unfortunately, the patient could not tolerate ingestion of a barium tablet to better evaluate this finding. If there is clinical concern for distal esophageal stricture, further evaluation with nonemergent endoscopy should be considered. Electronically Signed   By: Vinnie Langton M.D.   On: 09/24/2019 09:24    Microbiology: Recent Results (from the past 240 hour(s))  SARS CORONAVIRUS 2 (TAT 6-24 HRS) Nasopharyngeal  Nasopharyngeal Swab     Status: None   Collection Time: 10/04/19  8:17 PM   Specimen: Nasopharyngeal Swab  Result Value Ref Range Status   SARS Coronavirus 2 NEGATIVE NEGATIVE Final    Comment: (NOTE) SARS-CoV-2 target nucleic acids are NOT DETECTED. The SARS-CoV-2 RNA is generally detectable in upper and lower respiratory specimens during the acute phase of infection. Negative results do not preclude SARS-CoV-2 infection, do not rule out co-infections with other pathogens, and should not be used as the sole basis for treatment or other patient management decisions. Negative results must be combined with clinical observations, patient history, and epidemiological information. The expected result is Negative. Fact Sheet for Patients: SugarRoll.be Fact Sheet for Healthcare Providers: https://www.woods-mathews.com/ This test is not yet approved or cleared by the [place] FDA and  has been authorized for detection and/or diagnosis of SARS-CoV-2 by FDA under an Emergency Use Authorization (EUA). This EUA will remain  in effect (meaning this test can be used) for the duration of the COVID-19  declaration under Section 56 4(b)(1) of the Act, 21 U.S.C. section 360bbb-3(b)(1), unless the authorization is terminated or revoked sooner. Performed at [facility], [place] [address], [place], [place] [address]   MRSA PCR Screening     Status: None   Collection Time: 10/07/19  3:10 AM   Specimen: Nasal Mucosa; Nasopharyngeal  Result Value Ref Range Status   MRSA by PCR NEGATIVE NEGATIVE Final    Comment:        The GeneXpert MRSA Assay (FDA approved for NASAL specimens only), is one component of a comprehensive MRSA colonization surveillance program. It is not intended to diagnose MRSA infection nor to guide or monitor treatment for MRSA infections. Performed at [facility], [place] [address] [place], [place] [address]   SARS  CORONAVIRUS 2 (TAT 6-24 HRS) Nasopharyngeal Nasopharyngeal Swab     Status: None   Collection Time: 10/09/19  3:45 PM   Specimen: Nasopharyngeal Swab  Result Value Ref Range Status   SARS Coronavirus 2 NEGATIVE NEGATIVE Final    Comment: (NOTE) SARS-CoV-2 target nucleic acids are NOT DETECTED. The SARS-CoV-2 RNA is generally detectable in upper and lower respiratory specimens during the acute phase of infection. Negative results do not preclude SARS-CoV-2 infection, do not rule out co-infections with other pathogens, and should not be used as the sole basis for treatment or other patient management decisions. Negative results must be combined with clinical observations, patient history, and epidemiological information. The expected result is Negative. Fact Sheet for Patients: SugarRoll.be Fact Sheet for Healthcare Providers: https://www.woods-mathews.com/ This test is not yet approved or cleared by the [place] FDA and  has been authorized for detection and/or diagnosis of SARS-CoV-2 by FDA under an Emergency Use Authorization (EUA). This EUA will remain  in effect (meaning this test can be used) for the duration of the COVID-19 declaration under Section 56 4(b)(1) of the Act, 21 U.S.C. section 360bbb-3(b)(1), unless the authorization is terminated or revoked sooner. Performed at [facility], [place] [address], [place], [place] [address]      Labs: Basic Metabolic Panel: Recent Labs  Lab 10/05/19 0829 10/06/19 1100 10/07/19 0603 10/08/19 0454 10/09/19 0512  NA 142 143 143 143 135  K 3.8 3.9 3.9 3.6 3.8  CL 112* 113* 112* 111 106  CO2 18* 21* 20* 22 20*  GLUCOSE 105* 111* 116* 100* 85  BUN 34* 28* 27* 23 20  CREATININE 1.54* 1.46* 1.53* 1.58* 1.62*  CALCIUM 8.4* 8.5* 8.3* 8.4* 8.2*   Liver Function Tests: Recent Labs  Lab 10/04/19 1806 10/05/19 0829  AST 18 14*  ALT 6 7  ALKPHOS 95 73  BILITOT 0.5 0.2*  PROT 7.1  6.8  ALBUMIN 2.5* 2.4*   No results for input(s): LIPASE, AMYLASE in the last 168 hours. No results for input(s): AMMONIA in the last 168 hours. CBC: Recent Labs  Lab 10/05/19 0829 10/05/19 1750 10/06/19 1100 10/06/19 1100 10/07/19 0603 10/07/19 0603 10/08/19 0454 10/08/19 1622 10/09/19 0512 10/10/19 0500 10/10/19 1140  WBC 12.7*  --  14.3*  --  11.6*  --  8.3  --  8.1  --   --   HGB 8.1*   < > 7.4*   < > 7.6*   < > 7.0* 7.7* 7.6* 7.0* 7.5*  HCT 25.7*   < > 24.0*   < > 25.0*   < > 23.4* 26.4* 25.7* 23.7* 24.3*  MCV 88.9  --  91.3  --  92.3  --  94.0  --  93.5  --   --   PLT 366  --  326  --  350  --  309  --  342  --   --    < > = values in this interval not displayed.   Cardiac Enzymes: No results for input(s): CKTOTAL, CKMB, CKMBINDEX, TROPONINI in the last 168 hours. BNP: BNP (last 3 results) No results for input(s): BNP in the last 8760 hours.  ProBNP (last 3 results) No results for input(s): PROBNP in the last 8760 hours.  CBG: Recent Labs  Lab 10/09/19 1126 10/09/19 1614 10/09/19 2121 10/10/19 0816 10/10/19 1112  GLUCAP 89 135* 132* 110* 115*       Signed:  Keynan Heffern  [facility] 10/10/2019, 2:29 PM

## 2019-10-10 NOTE — Evaluation (Addendum)
Occupational Therapy Evaluation Patient Details Name: Vanessa [name] MRN: [id number] DOB: 1956-01-23 Today's Date: 10/10/2019    History of Present Illness Pt is 64 y.o. female with medical history significant for hypertension, type 2 diabetes mellitus, recent diagnosis endometrial cancer, and MSSA.  Pt with recent hospital admission with vaginal bleeding and now with possible GIB.  Additionally pt with L humeral fx with callus formation consistent with healing - per last admission ok to use functionally and WBAT.   Clinical Impression   Pt admitted from SNF for above and limited by problem list below, including L shoulder pain and edema with decreased functional use, impaired balance, generalized weakness and decreased activity tolerance.  She currently requires min-max assist for ADLs, min guard for transfers and in room mobility with hand held assist. Encouarged L UE ROM exercises as tolerated, L UE elevation for edema reduction; reviewed sling for comfort only. She reports since admission to SNF she has been needing some assist for ADLs, mobility but prior to initial admission to [facility] she was independent.  She will benefit from further OT services acutely and after dc at SNF level to optimize independence and return to PLOF. Will follow.     Follow Up Recommendations  SNF;Supervision/Assistance - 24 hour    Equipment Recommendations  Other (comment)(TBD at next venue of care)    Recommendations for Other Services       Precautions / Restrictions Precautions Precautions: Fall Shoulder Interventions: Shoulder sling/immobilizer;For comfort Precaution Comments: L proximal humerus fx healing (not acute)- per admission 09/20/19 pt can use arm functionally and WBAT, sling for comfort Required Braces or Orthoses: Sling Restrictions Weight Bearing Restrictions: Yes LUE Weight Bearing: Weight bearing as tolerated Other Position/Activity Restrictions: L shoulder pain with xray finding proximal  humerus fx healing (not acute)- per chat text with Dr. Cathlean Sauer (09/20/2019) pt can use arm functionally and WBAT, sling for comfort      Mobility Bed Mobility Overal bed mobility: Needs Assistance             General bed mobility comments: OOB in recliner upon entry   Transfers Overall transfer level: Needs assistance Equipment used: 1 person hand held assist Transfers: Sit to/from Stand Sit to Stand: Min guard         General transfer comment: min guard steady cueing for hand placement     Balance Overall balance assessment: Needs assistance Sitting-balance support: Feet supported Sitting balance-Leahy Scale: Good     Standing balance support: Single extremity supported;During functional activity Standing balance-Leahy Scale: Fair Standing balance comment: Pt relient on single UE support during ADLs                           ADL either performed or assessed with clinical judgement   ADL Overall ADL's : Needs assistance/impaired     Grooming: Wash/dry face;Oral care;Set up;Sitting   Upper Body Bathing: Moderate assistance;Sitting   Lower Body Bathing: Moderate assistance;Sit to/from stand   Upper Body Dressing : Moderate assistance;Sitting   Lower Body Dressing: Maximal assistance;Sit to/from stand Lower Body Dressing Details (indicate cue type and reason): reports using sock aide at SNF, assist for socks today; min assist sit to stand  Toilet Transfer: Ambulation;BSC;Min guard([facility] to [facility] )           Functional mobility during ADLs: Cueing for sequencing;Min guard General ADL Comments: pt limited by L shoulder pain, impaired balance and decreased activity tolerance     Vision  Vision Assessment?: No apparent visual deficits     Perception     Praxis      Pertinent Vitals/Pain Pain Assessment: Faces Faces Pain Scale: Hurts little more Pain Location: L UE Pain Descriptors / Indicators: Discomfort;Guarding;Sore Pain Intervention(s):  Limited activity within patient's tolerance;Monitored during session;Repositioned     Hand Dominance Right   Extremity/Trunk Assessment Upper Extremity Assessment Upper Extremity Assessment: RUE deficits/detail;LUE deficits/detail RUE Deficits / Details: WFL, grossly 4/5 MMT LUE Deficits / Details: WFL elbow, wrist, hand; pt declines shoulder mvmt due to pain; noted edema in hand  LUE Sensation: WNL LUE Coordination: decreased fine motor;decreased gross motor   Lower Extremity Assessment Lower Extremity Assessment: Defer to PT evaluation       Communication Communication Communication: No difficulties   Cognition Arousal/Alertness: Awake/alert Behavior During Therapy: WFL for tasks assessed/performed Overall Cognitive Status: No family/caregiver present to determine baseline cognitive functioning Area of Impairment: Memory;Following commands;Safety/judgement;Awareness;Problem solving                     Memory: Decreased short-term memory;Decreased recall of precautions Following Commands: Follows one step commands consistently;Follows one step commands with increased time;Follows multi-step commands inconsistently Safety/Judgement: Decreased awareness of safety;Decreased awareness of deficits Awareness: Emergent Problem Solving: Difficulty sequencing;Requires verbal cues General Comments: pt remains with decreased safety awareness and STM; initally reports being at home prior to admission (but able to verbalize SNF after further discussion)   General Comments  VSS; educated on sling for comfort and elevation/exercises to   L UE for edema reduction and functional use      Exercises     Shoulder Instructions      Home Living Family/patient expects to be discharged to:: Skilled nursing facility                                        Prior Functioning/Environment Level of Independence: Needs assistance  Gait / Transfers Assistance Needed: Reports  household ambulation; reports "furniture walking"; reports fell OOB when asleep but denies other falls ADL's / Homemaking Assistance Needed: Reports assist with bathing, but indepedent with toileting and dressing but with difficulty   Comments: limited historian, initally reporting she was at home before admission but with cueing able to correct to SNF; above was prior to recent admission but has needed assist from SNF staff for mobility and ADLs since dc         OT Problem List: Decreased strength;Decreased range of motion;Decreased activity tolerance;Impaired balance (sitting and/or standing);Decreased coordination;Decreased cognition;Decreased safety awareness;Decreased knowledge of use of DME or AE;Decreased knowledge of precautions;Obesity;Pain;Impaired UE functional use;Increased edema;Impaired sensation      OT Treatment/Interventions: Self-care/ADL training;Therapeutic exercise;DME and/or AE instruction;Therapeutic activities;Cognitive remediation/compensation;Patient/family education;Balance training    OT Goals(Current goals can be found in the care plan section) Acute Rehab OT Goals Patient Stated Goal: return to SNF at d/c to get stronger OT Goal Formulation: With patient Time For Goal Achievement: 10/24/19 Potential to Achieve Goals: Good  OT Frequency: Min 2X/week   Barriers to D/C:            Co-evaluation              AM-PAC OT "6 Clicks" Daily Activity     Outcome Measure Help from another person eating meals?: A Little Help from another person taking care of personal grooming?: A Little Help from another person toileting, which includes using  toliet, bedpan, or urinal?: A Lot Help from another person bathing (including washing, rinsing, drying)?: A Lot Help from another person to put on and taking off regular upper body clothing?: A Little Help from another person to put on and taking off regular lower body clothing?: A Lot 6 Click Score: 15   End of  Session Equipment Utilized During Treatment: Other (comment)(sling ) Nurse Communication: Mobility status;Precautions  Activity Tolerance: Patient tolerated treatment well Patient left: in chair;with call bell/phone within reach;with nursing/sitter in room  OT Visit Diagnosis: Other abnormalities of gait and mobility (R26.89);Muscle weakness (generalized) (M62.81);Pain Pain - Right/Left: Left Pain - part of body: Shoulder                Time: 5300-5110 OT Time Calculation (min): 18 min Charges:  OT General Charges $OT Visit: 1 Visit OT Evaluation $OT Eval Moderate Complexity: 1 Mod  Vanessa [name], OT Acute Rehabilitation Services Pager [phone] Office [phone]   Vanessa [name] 10/10/2019, 10:08 AM

## 2019-10-10 NOTE — TOC Transition Note (Signed)
Transition of Care [facility]) - CM/SW Discharge Note   Patient Details  Name: Vanessa [name] MRN: [id number] Date of Birth: 01-17-1956  Transition of [facility]) CM/SW Contact:  Vinie Sill, [place] Phone Number: 10/10/2019, 3:33 PM   Clinical Narrative:     Patient will DC to: [place] Date: 10/10/2019 Family Notified:[email or web address].spouse Transport By: Corey Harold  RN, patient, and facility notified of DC. Discharge Summary sent to facility. RN given number for [phone]. Ambulance transport requested for patient.   Clinical Social Worker signing off.  Thurmond Butts, MSW, LCSWA Clinical Social Worker    Final next level of care: Skilled Nursing Facility Barriers to Discharge: Barriers Resolved   Patient Goals and CMS Choice   CMS Medicare.gov Compare Post Acute Care list provided to:: Patient Choice offered to / list presented to : Patient  Discharge Placement              Patient chooses bed at: [facility] Patient to be transferred to facility by: [place] Name of family member notified: Mr ednah, hammock Patient and family notified of of transfer: 10/10/19  Discharge Plan and Services                                     Social Determinants of Health ([place]) Interventions     Readmission Risk Interventions Readmission Risk Prevention Plan 09/25/2019  Transportation Screening Complete  PCP or Specialist Appt within 3-5 Days Not Complete  Not Complete comments plan for SNF  HRI or [place] Complete  Social Work Consult for [place] Planning/Counseling Complete  Palliative Care Screening Not Applicable  Medication Review Press photographer) Referral to Pharmacy  Some recent data might be hidden

## 2019-10-10 NOTE — Progress Notes (Signed)
PROGRESS NOTE    Vanessa [name]  QPR:[id number] DOB: 06/05/1956 DOA: 10/04/2019 PCP: Maren Reamer, MD (Inactive)   Brief Narrative:  HPI On 10/04/2019 by Dr. Bing Quarry Karle Desrosier is a 64 y.o. female with medical history significant for hypertension, type 2 diabetes mellitus, recent diagnosis endometrial cancer, and MSSA bacteremia currently on IV cefazolin, now presenting from her SNF for evaluation of low hemoglobin.  Patient reports that she is slightly more tired than usual, but denies any lightheadedness or chest pain.  She reports spending most of her days in bed for the past month.  She has not noted any vaginal bleeding since the recent hospital discharge, has not had any fevers or chills, has had dark stool but attributing this to iron, and denies any abdominal pain, nausea, vomiting, or constipation.  Hemoglobin was 7.0 when she was discharged 9 days ago, found to be in the mid 5 range at her SNF today, prompting transfer to the ED.  Assessment & Plan   Symptomatic anemia/GI bleed -Multifactorial -Patient was FOBT positive, also has chronic kidney disease and was noted to have vaginal bleeding on her last admission for which she had received 3 units of PRBC and was sent to SNF -Currently this admission, patient has received 2 units PRBC -No further vaginal bleeding -Gastroenterology consulted and appreciated -EGD on 10/08/2019: Tiny hiatal hernia present.  Biopsies taken to rule out H. pylori.  One small nonbleeding cratered duodenal ulcer with no stigmata of bleeding found duodenal bulb.  Recommendations for clear liquid diet.  Outpatient colonoscopy.  No NSAIDs.  Return to GI clinic as needed. -hemoglobin currently 7 (was 5.7 on admission) -will repeat H/H   Diabetes mellitus, type II -Hemoglobin A1c 5.7 on 09/15/2019 -Currently on sliding scale and CBG monitoring  Hyperlipidemia -Continue statin  Hypertensive urgency -Patient blood pressure has been poorly  controlled -currently on labetalol 200 mg 3 times daily, a lot of pain 10 mg daily, hydralazine 50 mg 3 times daily -BP improved  Dental disease -Orthopantogram on 09/22/2019 showed significant dental disease with areas of periapical lucency. -Patient will need to follow-up with dentistry  Chronic kidney disease, stage IIIb -Stable  MSSA bacteremia -MSSA grew from cultures on 09/17/2019 -09/19/2019 cultures showed no growth to date -No vegetations noted on TTE, TEE probe could not be passed -Patient will remain on cefazolin until October 31, 2019 -PICC line placed on 10/05/2019  Endometrial adenocarcinoma -Diagnosed earlier this month -As noted above, no further vaginal bleeding -Patient was started on Megace and has a gynecology/oncology referral  Esophageal dysmotility/stricture -Patient with dysphagia.  Unable to pass TEE probe. -GI has seen patient. -Currently on dysphagia 3 diet  Morbid obesity -BMI 40.08 -Discuss lifestyle modifications with PCP  Pressure ulcer gluteal cleft, stage II -noted on admission  DVT Prophylaxis  SCDs  Code Status: Full  Family Communication: None at bedside  Disposition Plan: Admitted from SNF.  Patient with symptomatic anemia, gastroenterology performed EGD on 10/08/2019.  Hemoglobin dropped to 7 today, will repeat. Suspect discharged back to SNF in 1-2 days pending insurance auth. Repeat COVID negative.  Consultants Gastroenterology  Procedures  EGD  Antibiotics   Anti-infectives (From admission, onward)   Start     Dose/Rate Route Frequency Ordered Stop   10/06/19 1800  ceFAZolin (ANCEF) IVPB 2g/100 mL premix     2 g 200 mL/hr over 30 Minutes Intravenous Every 8 hours 10/06/19 1506     10/04/19 2200  ceFAZolin (ANCEF) IVPB 2g/100 mL  premix  Status:  Discontinued     2 g 200 mL/hr over 30 Minutes Intravenous Every 12 hours 10/04/19 2017 10/06/19 1506      Subjective:   [name] seen and examined today.  No complaints today.  Denies further bleeding. Denies chest pain, shortness of breath, abdominal pain, dizziness or headache, nausea or vomiting.   Objective:   Vitals:   10/09/19 1458 10/09/19 1627 10/09/19 2028 10/10/19 0400  BP:  (!) 114/95 (!) 171/83 (!) 154/84  Pulse: 85 78 76 79  Resp:   20 16  Temp:   98 F (36.7 C) 98.3 F (36.8 C)  TempSrc:   Oral Oral  SpO2: 98%  99% 99%  Weight:        Intake/Output Summary (Last 24 hours) at 10/10/2019 1220 Last data filed at 10/09/2019 2000 Gross per 24 hour  Intake 120 ml  Output --  Net 120 ml   Filed Weights   10/06/19 0500 10/07/19 0551 10/09/19 0504  Weight: 91.2 kg 90.9 kg 93.1 kg   Exam  General: Well developed, well nourished, NAD  HEENT: NCAT, mucous membranes moist.   Cardiovascular: S1 S2 auscultated, RRR, no murmur  Respiratory: Clear to auscultation bilaterally  Abdomen: Soft, nontender, nondistended, + bowel sounds  Extremities: warm dry without cyanosis clubbing or edema  Neuro: AAOx3, nonfocal  Psych: Pleasant, appropriate mood and affect  Data Reviewed: I have personally reviewed following labs and imaging studies  CBC: Recent Labs  Lab 10/05/19 0829 10/05/19 1750 10/06/19 1100 10/06/19 1100 10/07/19 0603 10/07/19 0603 10/08/19 0454 10/08/19 1622 10/09/19 0512 10/10/19 0500 10/10/19 1140  WBC 12.7*  --  14.3*  --  11.6*  --  8.3  --  8.1  --   --   HGB 8.1*   < > 7.4*   < > 7.6*   < > 7.0* 7.7* 7.6* 7.0* 7.5*  HCT 25.7*   < > 24.0*   < > 25.0*   < > 23.4* 26.4* 25.7* 23.7* 24.3*  MCV 88.9  --  91.3  --  92.3  --  94.0  --  93.5  --   --   PLT 366  --  326  --  350  --  309  --  342  --   --    < > = values in this interval not displayed.   Basic Metabolic Panel: Recent Labs  Lab 10/05/19 0829 10/06/19 1100 10/07/19 0603 10/08/19 0454 10/09/19 0512  NA 142 143 143 143 135  K 3.8 3.9 3.9 3.6 3.8  CL 112* 113* 112* 111 106  CO2 18* 21* 20* 22 20*  GLUCOSE 105* 111* 116* 100* 85  BUN 34* 28* 27* 23 20   CREATININE 1.54* 1.46* 1.53* 1.58* 1.62*  CALCIUM 8.4* 8.5* 8.3* 8.4* 8.2*   GFR: Estimated Creatinine Clearance: 35.3 mL/min (A) (by C-G formula based on SCr of 1.62 mg/dL (H)). Liver Function Tests: Recent Labs  Lab 10/04/19 1806 10/05/19 0829  AST 18 14*  ALT 6 7  ALKPHOS 95 73  BILITOT 0.5 0.2*  PROT 7.1 6.8  ALBUMIN 2.5* 2.4*   No results for input(s): LIPASE, AMYLASE in the last 168 hours. No results for input(s): AMMONIA in the last 168 hours. Coagulation Profile: No results for input(s): INR, PROTIME in the last 168 hours. Cardiac Enzymes: No results for input(s): CKTOTAL, CKMB, CKMBINDEX, TROPONINI in the last 168 hours. BNP (last 3 results) No results for input(s): PROBNP in the  last 8760 hours. HbA1C: No results for input(s): HGBA1C in the last 72 hours. CBG: Recent Labs  Lab 10/09/19 1126 10/09/19 1614 10/09/19 2121 10/10/19 0816 10/10/19 1112  GLUCAP 89 135* 132* 110* 115*   Lipid Profile: No results for input(s): CHOL, HDL, LDLCALC, TRIG, CHOLHDL, LDLDIRECT in the last 72 hours. Thyroid Function Tests: No results for input(s): TSH, T4TOTAL, FREET4, T3FREE, THYROIDAB in the last 72 hours. Anemia Panel: No results for input(s): VITAMINB12, FOLATE, FERRITIN, TIBC, IRON, RETICCTPCT in the last 72 hours. Urine analysis:    Component Value Date/Time   COLORURINE YELLOW 10/05/2019 1140   APPEARANCEUR HAZY (A) 10/05/2019 1140   LABSPEC 1.016 10/05/2019 1140   PHURINE 5.0 10/05/2019 1140   GLUCOSEU NEGATIVE 10/05/2019 1140   HGBUR SMALL (A) 10/05/2019 1140   BILIRUBINUR NEGATIVE 10/05/2019 1140   KETONESUR NEGATIVE 10/05/2019 1140   PROTEINUR 100 (A) 10/05/2019 1140   UROBILINOGEN 0.2 03/06/2014 1644   NITRITE NEGATIVE 10/05/2019 1140   LEUKOCYTESUR NEGATIVE 10/05/2019 1140   Sepsis Labs: @LABRCNTIP (procalcitonin:4,lacticidven:4)  ) Recent Results (from the past 240 hour(s))  SARS CORONAVIRUS 2 (TAT 6-24 HRS) Nasopharyngeal Nasopharyngeal Swab      Status: None   Collection Time: 10/04/19  8:17 PM   Specimen: Nasopharyngeal Swab  Result Value Ref Range Status   SARS Coronavirus 2 NEGATIVE NEGATIVE Final    Comment: (NOTE) SARS-CoV-2 target nucleic acids are NOT DETECTED. The SARS-CoV-2 RNA is generally detectable in upper and lower respiratory specimens during the acute phase of infection. Negative results do not preclude SARS-CoV-2 infection, do not rule out co-infections with other pathogens, and should not be used as the sole basis for treatment or other patient management decisions. Negative results must be combined with clinical observations, patient history, and epidemiological information. The expected result is Negative. Fact Sheet for Patients: SugarRoll.be Fact Sheet for Healthcare Providers: https://www.woods-mathews.com/ This test is not yet approved or cleared by the [place] FDA and  has been authorized for detection and/or diagnosis of SARS-CoV-2 by FDA under an Emergency Use Authorization (EUA). This EUA will remain  in effect (meaning this test can be used) for the duration of the COVID-19 declaration under Section 56 4(b)(1) of the Act, 21 U.S.C. section 360bbb-3(b)(1), unless the authorization is terminated or revoked sooner. Performed at [facility], [place] [address], [place], [place] [address]   MRSA PCR Screening     Status: None   Collection Time: 10/07/19  3:10 AM   Specimen: Nasal Mucosa; Nasopharyngeal  Result Value Ref Range Status   MRSA by PCR NEGATIVE NEGATIVE Final    Comment:        The GeneXpert MRSA Assay (FDA approved for NASAL specimens only), is one component of a comprehensive MRSA colonization surveillance program. It is not intended to diagnose MRSA infection nor to guide or monitor treatment for MRSA infections. Performed at [facility], [place] [address] [place], [place] [address]   SARS CORONAVIRUS 2 (TAT 6-24 HRS)  Nasopharyngeal Nasopharyngeal Swab     Status: None   Collection Time: 10/09/19  3:45 PM   Specimen: Nasopharyngeal Swab  Result Value Ref Range Status   SARS Coronavirus 2 NEGATIVE NEGATIVE Final    Comment: (NOTE) SARS-CoV-2 target nucleic acids are NOT DETECTED. The SARS-CoV-2 RNA is generally detectable in upper and lower respiratory specimens during the acute phase of infection. Negative results do not preclude SARS-CoV-2 infection, do not rule out co-infections with other pathogens, and should not be used as  the sole basis for treatment or other patient management decisions. Negative results must be combined with clinical observations, patient history, and epidemiological information. The expected result is Negative. Fact Sheet for Patients: SugarRoll.be Fact Sheet for Healthcare Providers: https://www.woods-mathews.com/ This test is not yet approved or cleared by the [place] FDA and  has been authorized for detection and/or diagnosis of SARS-CoV-2 by FDA under an Emergency Use Authorization (EUA). This EUA will remain  in effect (meaning this test can be used) for the duration of the COVID-19 declaration under Section 56 4(b)(1) of the Act, 21 U.S.C. section 360bbb-3(b)(1), unless the authorization is terminated or revoked sooner. Performed at [facility], [place] [address] [place], [place] [address]       Radiology Studies: No results found.   Scheduled Meds: . amLODipine  10 mg Oral Daily  . Chlorhexidine Gluconate Cloth  6 each Topical Daily  . hydrALAZINE  50 mg Oral Q8H  . insulin aspart  0-6 Units Subcutaneous TID WC  . labetalol  200 mg Oral TID  . pantoprazole sodium  40 mg Oral Daily  . sodium chloride flush  10-40 mL Intracatheter Q12H   Continuous Infusions: .  ceFAZolin (ANCEF) IV 2 g (10/10/19 0624)     LOS: 5 days   Time Spent in minutes   30 minutes  Alahni Varone D.O. on 10/10/2019 at 12:20  PM  Between 7am to 7pm - Please see pager noted on [email or web address]  After 7pm go to [email or web address]  And look for the night coverage person covering for me after hours  [facility] Office  [phone]

## 2019-10-10 NOTE — Progress Notes (Signed)
Pharmacy Antibiotic Note  Vanessa [name] is a 64 y.o. female admitted on 10/04/2019 with MSSA bacteremia. Pharmacy has been consulted for cefazolin dosing. Cefazolin started during recent admission with end-date 4/22 per ID. Cr has improved since recent admit discharge (~1.77) to 1.62 today.  Plan: Continue cefazolin to 2g IV q8h  Weight: 93.1 kg (205 lb 4 oz)  Temp (24hrs), Avg:98 F (36.7 C), Min:97.8 F (36.6 C), Max:98.3 F (36.8 C)  Recent Labs  Lab 10/05/19 0829 10/06/19 1100 10/07/19 0603 10/08/19 0454 10/09/19 0512  WBC 12.7* 14.3* 11.6* 8.3 8.1  CREATININE 1.54* 1.46* 1.53* 1.58* 1.62*    Estimated Creatinine Clearance: 35.3 mL/min (A) (by C-G formula based on SCr of 1.62 mg/dL (H)).    No Known Allergies  Antimicrobials this admission: 3/26 cefazolin >>   Dose adjustments this admission: N/A  Microbiology results: N/A  Thank you for allowing pharmacy to be a part of this patient's care.  Acey Lav, PharmD  PGY1 Acute Care Pharmacy Resident Please check AMION for all [facility] numbers 10/10/2019

## 2019-10-15 NOTE — Telephone Encounter (Signed)
Left message for Mignon Pine at [facility] to call and schedule appt for patient.

## 2019-10-16 ENCOUNTER — Encounter: Payer: Self-pay | Admitting: Gynecologic Oncology

## 2019-10-16 NOTE — Progress Notes (Signed)
GYNECOLOGIC ONCOLOGY NEW PATIENT CONSULTATION   Patient Name: Vanessa [name]  Patient Age: 64 y.o. Date of Service: 10/17/19 Referring Provider: No referring provider defined for this encounter.   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  We reviewed the nature of endometrial cancer and its recommended surgical staging, including total hysterectomy, bilateral salpingo-oophorectomy, and lymph node assessment.   Given her multiple comorbidities, recent hospitalizations and current/ongoing treatment of bacteremia, she is not a surgical candidate.  She underwent CT imaging that did not show evidence of metastatic disease.  I reviewed that in the case of patients who are not surgical candidates or desire to maintain fertility, we can treat endometrial hyperplasia and cancer with progesterone, either in the form of oral progesterone or a hormonal IUD.  The patient was started on Megace after her biopsy in mid March.  Her bleeding has significantly improved since that time.  I am somewhat nervous about leaving her on the Megace.  While it has improved her appetite significantly, she already has fairly profound lower extremity edema.  She does not endorse a change in this over the last month, but I have recommended that we transition to a hormonal IUD.  The patient is amenable to this.  We discussed the option of performing this procedure in clinic versus an outpatient surgery accompanied with a D&C.  Given her medical complexity, her preference is to avoid any procedure that would cause additional risk.  In this case, I think it is reasonable to proceed with IUD placement in clinic.  I reviewed that the literature supporting hormonal management of endometrial cancer.   There have been small series and retrospective studies looking at the use of Mirena IUD supporting its use in hyperplasia and early stage uterine cancer.  Most recently, a study was presented last month of 34 women with stage I endometrial cancer or atypical hyperplasia that showed two thirds of patients had resolution of their pathology within 6 months after insertion of a hormonal IUD.  Looking specifically at women with cancer, 43% of the 20 women responded completely. Ideally, if the patient were to become a surgical candidate, we would move forward with surgical staging.  Given trichomonas seen on recent Pap test (does not appear to have been treated while patient was in the hospital), a prescription was given to the patient for 7 days of twice daily metronidazole.  A copy of this note was sent to the patient's referring provider.   50 minutes of total time was spent for this patient encounter, including preparation, face-to-face counseling with the patient and coordination of care, and documentation of the encounter.   Jeral Pinch, MD  Division of Gynecologic Oncology  Department of Obstetrics and Gynecology  [facility]  ___________________________________________  Chief Complaint: Chief Complaint  Patient presents with  . Endometrial adenocarcinoma [facility])    New Patient    History of Present Illness:  Vanessa [name] is a 64 y.o. y.o. female who is seen in consultation at the request of No ref. provider found for an evaluation of  newly diagnosed endometrial cancer.  The patient was recently admitted (discharged on 3/17) with symptomatic anemia in the setting of PMB and was treated for MSSA bacteremia. She was started on IV cefazolin with a plan for 6 weeks  of IV antibiotic therapy (EOT date 4/22). She received 3u of pRBCs, was started on Megace (improved bleeding), and EMB was performed revealing endometrial cancer, FIGO grade II, endometrioid  type.  Most recently, the patient was admitted from 3/26-4/1 from her SNF secondary to anemia. She received an additional 2u pRBCs. GI was consulted and the patient underwent EGD on 3/30 with one small non-bleeding cratered duodenal ulcer noted. Biopsies negative for H Pylori. She was discharged on a PPI back to her SNF.  Today, the patient presents from [facility], where she is staying and undergoing rehab after her recent discharge from the hospital.  She is unsure how long she will be there.  She continues on her 6-week course of IV antibiotics for bacteremia.  She denies any fevers or chills.  She is suffering from some allergy symptoms including congestion.  She notes that the vaginal bleeding stopped when she was started on Megace although has had some bleeding that she thinks is vaginal since going back to the rehab facility over the weekend.  She endorses a good appetite that has improved on Megace and denies any nausea or emesis.  She has some early satiety.  She reports normal bowel function and some difficulty emptying her bladder at times as well as some urinary incontinence.  In terms of her mobility, she is able to walk on her own but often requires assistance.  She needs help standing up as well as getting into bed.  She think she can walk about 100 feet without assistance, which she has been doing with physical therapy.  She has not been using a walker.  She gets some shortness of breath when walking or if she is not feeling good.  She denies any chest pain.  She gets dizzy at times when ambulating and this improves when she rests.  She fell out of bed in January and hit her left side.  She has been in an arm sling since.  Patient endorses a history of vaginal bleeding on and off for years after going through menopause in her 65s.  She also has had some blood in her stools.  Patient is unsure about her family history as family members are "private" about their health.  PAST MEDICAL  HISTORY:  Past Medical History:  Diagnosis Date  . Anemia 10/09/2019  . Asthma   . CKD (chronic kidney disease) stage 3, GFR 30-59 ml/min   . Dental disease   . Diabetes mellitus without complication ([place])   . Endometrial cancer ([place])   . Esophageal dysmotility   . Heart murmur   . HLD (hyperlipidemia)   . Hypertension   . MSSA bacteremia 09/2019  . Obesity, Class III, BMI 40-49.9 (morbid obesity) ([place])   . Pressure ulcer      PAST SURGICAL HISTORY:  Past Surgical History:  Procedure Laterality Date  . BIOPSY  10/08/2019   Procedure: BIOPSY;  Surgeon: Clarene Essex, MD;  Location: [facility] ENDOSCOPY;  Service: Endoscopy;;  . ESOPHAGOGASTRODUODENOSCOPY N/A 10/08/2019   Procedure: ESOPHAGOGASTRODUODENOSCOPY (EGD);  Surgeon: Clarene Essex, MD;  Location: [facility];  Service: Endoscopy;  Laterality: N/A;  . TYMPANOSTOMY TUBE PLACEMENT Bilateral     OB/GYN HISTORY:  OB History  Gravida Para Term Preterm AB Living  5 5 4 1   5   SAB TAB Ectopic Multiple Live Births          5    # Outcome Date GA Lbr Len/2nd Weight Sex Delivery Anes PTL Lv  5  Term 19    F Vag-Spont   LIV  4 Term 42    M Vag-Spont   LIV  3 Term 27    M Vag-Spont   LIV  2 Preterm 1984    M Vag-Spont   LIV  1 Term             Patient's last menstrual period was 03/17/2015.  Age at menarche: unsure  Age at menopause: 13s Hx of HRT: denies Hx of STDs: remembers being treated in the remote past for something; recently Dalbert Batman was noted on her pap test Last pap: 09/2019 - atypical endometrial cells, NOS History of abnormal pap smears: no but states had infrequent paps   MEDICATIONS: Outpatient Encounter Medications as of 10/17/2019  Medication Sig  . acetaminophen (TYLENOL) 500 MG tablet Take 500 mg by mouth every 6 (six) hours as needed for moderate pain.  Marland Kitchen amLODipine (NORVASC) 10 MG tablet Take 1 tablet (10 mg total) by mouth daily.  . benzocaine (ORAJEL) 10 % mucosal gel Use as directed 1 application in the  mouth or throat 4 (four) times daily as needed for mouth pain.  . Blood Glucose Monitoring Suppl (BAYER CONTOUR MONITOR) w/Device KIT Use as directed for 3 times daily testing of blood glucose. E11.9  . ceFAZolin (ANCEF) 2-4 GM/100ML-% IVPB Inject 100 mLs (2 g total) into the vein every 8 (eight) hours.  Marland Kitchen ceFAZolin (ANCEF) IVPB Inject 2 g into the vein every 8 (eight) hours for 22 days. Indication:  MSSA bacteremia Last Day of Therapy:  10/31/19 Labs - Once weekly:  CBC/D and BMP, Labs - Every other week:  ESR and CRP  . Chlorhexidine Gluconate Cloth 2 % PADS Apply 6 each topically daily.  . ferrous sulfate 325 (65 FE) MG tablet Take 1 tablet (325 mg total) by mouth 2 (two) times daily with a meal.  . glucose blood (BAYER CONTOUR TEST) test strip Use as instructed for 3 times daily testing of blood glucose. E11.9  . hydrALAZINE (APRESOLINE) 50 MG tablet Take 1 tablet (50 mg total) by mouth every 8 (eight) hours.  . INSULIN SYRINGE 1CC/29G 29G X 1/2" 1 ML MISC Insulin syringes  . labetalol (NORMODYNE) 200 MG tablet Take 1 tablet (200 mg total) by mouth 3 (three) times daily.  . Lancets (ONETOUCH ULTRASOFT) lancets Use as instructed  . megestrol (MEGACE) 40 MG tablet Take 2 tablets (80 mg total) by mouth 2 (two) times daily.  Marland Kitchen oxyCODONE (OXY IR/ROXICODONE) 5 MG immediate release tablet Take 1 tablet (5 mg total) by mouth every 6 (six) hours as needed for moderate pain.  . pantoprazole sodium (PROTONIX) 40 mg/20 mL PACK Take 20 mLs (40 mg total) by mouth daily.  . polyethylene glycol (MIRALAX / GLYCOLAX) 17 g packet Take 17 g by mouth daily.  . metroNIDAZOLE (FLAGYL) 500 MG tablet Take 1 tablet (500 mg total) by mouth 2 (two) times daily for 7 days.  . [DISCONTINUED] ceFAZolin (ANCEF) IVPB Inject 2 g into the vein every 12 (twelve) hours. Indication:  MSSA Bacteremia Last Day of Therapy:  10/31/2019 Labs - Once weekly:  CBC/D and BMP, Labs - Every other week:  ESR and CRP  . [DISCONTINUED]  diclofenac Sodium (VOLTAREN) 1 % GEL Apply 2 g topically 4 (four) times daily. Apply to left shoulder.  . [DISCONTINUED] oxyCODONE (OXY IR/ROXICODONE) 5 MG immediate release tablet Take 1 tablet (5 mg total) by mouth every 6 (six) hours as needed for moderate pain.  . [  DISCONTINUED] acetaminophen (TYLENOL) suppository 650 mg   . [DISCONTINUED] acetaminophen (TYLENOL) tablet 650 mg   . [DISCONTINUED] amLODipine (NORVASC) tablet 10 mg   . [DISCONTINUED] benzocaine (ORAJEL) 10 % mucosal gel 1 application   . [DISCONTINUED] ceFAZolin (ANCEF) IVPB 2g/100 mL premix   . [DISCONTINUED] Chlorhexidine Gluconate Cloth 2 % PADS 6 each   . [DISCONTINUED] diphenhydrAMINE (BENADRYL) capsule 25 mg   . [DISCONTINUED] fentaNYL (SUBLIMAZE) injection 25-50 mcg   . [DISCONTINUED] hydrALAZINE (APRESOLINE) tablet 25 mg   . [DISCONTINUED] hydrALAZINE (APRESOLINE) tablet 50 mg   . [DISCONTINUED] insulin aspart (novoLOG) injection 0-6 Units   . [DISCONTINUED] labetalol (NORMODYNE) injection 10 mg   . [DISCONTINUED] labetalol (NORMODYNE) tablet 200 mg   . [DISCONTINUED] ondansetron (ZOFRAN) injection 4 mg   . [DISCONTINUED] ondansetron (ZOFRAN) tablet 4 mg   . [DISCONTINUED] pantoprazole sodium (PROTONIX) 40 mg/20 mL oral suspension 40 mg   . [DISCONTINUED] sodium chloride flush (NS) 0.9 % injection 10-40 mL   . [DISCONTINUED] sodium chloride flush (NS) 0.9 % injection 10-40 mL    No facility-administered encounter medications on file as of 10/17/2019.    ALLERGIES:  No Known Allergies   FAMILY HISTORY:  Family History  Problem Relation Age of Onset  . Stroke Mother   . Diabetes Mother   . Hypertension Mother   . Cancer Mother        possible ovarian  . Diabetes Brother   . Hypertension Brother   . Cancer Maternal Grandmother        possible ovarian     SOCIAL HISTORY:    Social Connections:   . Frequency of Communication with Friends and Family:   . Frequency of Social Gatherings with Friends and  Family:   . Attends Religious Services:   . Active Member of Clubs or Organizations:   . Attends Archivist Meetings:   Marland Kitchen Marital Status:     REVIEW OF SYSTEMS:  Reports decreased appetite, hearing loss, cough, shortness of breath, leg swelling, early satiety, vaginal bleeding, joint pain, itching, dizziness, anxiety, difficulty emptying her bladder. Denies fevers, chills, fatigue, unexplained weight changes. Denies neck lumps or masses, mouth sores, ringing in ears or voice changes. Denies chest pain or palpitations. Denies abdominal distention, pain, blood in stools, constipation, diarrhea, nausea, vomiting. Denies pain with intercourse, dysuria, frequency, hematuria. Denies hot flashes, pelvic pain or vaginal discharge.   Denies back pain or muscle pain/cramps. Denies rash, or wounds. Denies eadaches, numbness or seizures. Denies swollen lymph nodes or glands, denies easy bruising or bleeding. Denies depression, confusion, or decreased concentration.  Physical Exam:  Vital Signs for this encounter:  Blood pressure (!) 187/98, pulse 83, temperature 98.7 F (37.1 C), temperature source Temporal, resp. rate 18, height 5' (1.524 m), weight 209 lb 2 oz (94.9 kg), last menstrual period 03/17/2015, SpO2 96 %. Body mass index is 40.84 kg/m. General: Alert, oriented, no acute distress.  HEENT: Normocephalic, atraumatic. Sclera anicteric.  Chest: Decreased breath sounds in posterior lung fields.  No wheezes, rhonchi, or rales. Cardiovascular: Regular rate and rhythm, no murmurs, rubs, or gallops.  Abdomen: Obese. Normoactive bowel sounds. Soft, nondistended, nontender to palpation.  Laxity of the anterior abdominal wall above the umbilicus.  No masses or hepatosplenomegaly appreciated. No palpable fluid wave.  Extremities: Grossly normal range of motion. Warm, well perfused.  2+ edema bilaterally with multiple healing skin lesions secondary to venous stasis and edema.  Lymphatics:  No cervical, supraclavicular, or inguinal adenopathy.  GU:  Normal  external female genitalia. No lesions. No discharge or bleeding.             Bladder/urethra:  No lesions or masses.             Vagina: Mildly atrophic vaginal mucosa.  Minimal amount of blood within the vault.             Cervix: Normal appearing, no lesions.  Cervix deviated to the right and somewhat flush with the vagina.             Uterus: Difficult to appreciate size secondary to body habitus, mobile, no parametrial involvement or nodularity.             Adnexa: No masses appreciated.  Rectal: No nodularity.  LABORATORY AND RADIOLOGIC DATA:  Outside medical records were reviewed to synthesize the above history, along with the history and physical obtained during the visit.   Lab Results  Component Value Date   WBC 8.1 10/09/2019   HGB 7.5 (L) 10/10/2019   HCT 24.3 (L) 10/10/2019   PLT 342 10/09/2019   GLUCOSE 85 10/09/2019   CHOL 153 08/10/2015   TRIG 82 08/10/2015   HDL 61 08/10/2015   LDLCALC 76 08/10/2015   ALT 7 10/05/2019   AST 14 (L) 10/05/2019   NA 135 10/09/2019   K 3.8 10/09/2019   CL 106 10/09/2019   CREATININE 1.62 (H) 10/09/2019   BUN 20 10/09/2019   CO2 20 (L) 10/09/2019   TSH 3.639 09/15/2019   INR 1.1 09/17/2019   HGBA1C 5.7 (H) 09/15/2019   MICROALBUR 12.5 03/24/2016   Labs from [place] [facility] on 4/7: Hemoglobin 7.3 Creatinine on 1/5: 1.76  Pelvic ultrasound 09/14/19: IMPRESSION: Endometrium is poorly defined, however appears thickened measuring approximately 8-9 mm. In the setting of post-menopausal bleeding, endometrial sampling is indicated to exclude carcinoma. If results are benign, sonohysterogram should be considered for focal lesion work-up. (Ref: Radiological Reasoning: Algorithmic Workup of Abnormal Vaginal Bleeding with Endovaginal Sonography and Sonohysterography. AJR 2008; [id number])  CT A/P on 3/7: 1. Suboptimal evaluation of the uterus in the absence of IV  contrast. See separate recent ultrasound for further evaluation and recommendations. 2. Anemia. 3. Small right-sided pleural effusion. 4. Trace abdominal ascites. 5. Atrophic left kidney which may be secondary to a distal left ureteral stone measuring approximately 7 mm. The left kidney is likely nonfunctional as there is no left-sided hydronephrosis.  EMB on 3/11: A. ENDOMETRIUM, BIOPSY:  - Endometrial adenocarcinoma, see comment.  - Fragment of endometrial type polyp.

## 2019-10-17 ENCOUNTER — Encounter: Payer: Self-pay | Admitting: Gynecologic Oncology

## 2019-10-17 ENCOUNTER — Inpatient Hospital Stay: Payer: No Typology Code available for payment source | Attending: Gynecologic Oncology | Admitting: Gynecologic Oncology

## 2019-10-17 ENCOUNTER — Other Ambulatory Visit: Payer: Self-pay

## 2019-10-17 VITALS — BP 187/98 | HR 83 | Temp 98.7°F | Resp 18 | Ht 60.0 in | Wt 209.1 lb

## 2019-10-17 DIAGNOSIS — A599 Trichomoniasis, unspecified: Secondary | ICD-10-CM

## 2019-10-17 DIAGNOSIS — C541 Malignant neoplasm of endometrium: Secondary | ICD-10-CM | POA: Diagnosis not present

## 2019-10-17 DIAGNOSIS — R600 Localized edema: Secondary | ICD-10-CM | POA: Insufficient documentation

## 2019-10-17 DIAGNOSIS — R601 Generalized edema: Secondary | ICD-10-CM | POA: Diagnosis not present

## 2019-10-17 DIAGNOSIS — I130 Hypertensive heart and chronic kidney disease with heart failure and stage 1 through stage 4 chronic kidney disease, or unspecified chronic kidney disease: Secondary | ICD-10-CM | POA: Diagnosis not present

## 2019-10-17 DIAGNOSIS — R7881 Bacteremia: Secondary | ICD-10-CM | POA: Diagnosis not present

## 2019-10-17 MED ORDER — METRONIDAZOLE 500 MG PO TABS: 500.0000 mg | ORAL_TABLET | Freq: Two times a day (BID) | ORAL | 0 refills | Status: AC

## 2019-10-17 NOTE — Patient Instructions (Signed)
Plan to follow up in the office on April 27 with Dr. Berline Lopes for Mirena intrauterine device (IUD) placement to treat the endometrial cancer.   Flagyl has been prescribed for you to take to treat the trichomonas found on your recent pap smear. Do not take this with alcohol.  We will sent the prescription for the Mirena IUD to our pharmacy who will then check with your insurance.  If there is a copay or issue with insurance coverage for the IUD, we will contact you.  Please call the office at [phone] for any questions or concerns.   Intrauterine Device Insertion  An intrauterine device (IUD) is a medical device that gets inserted into the uterus to treat endometrial cancer. It is a small, T-shaped device that has one or two nylon strings hanging down from it. The strings hang out of the lower part of the uterus (cervix) to allow for future IUD removal.    Hormone IUD. This type of IUD is made of plastic and contains the hormone progestin (synthetic progesterone). The hormone thickens mucus in the cervix and prevents sperm from entering the uterus. It also thins the uterine lining to prevent implantation of a fertilized egg. The hormone can weaken or kill the sperm that get into the uterus. A hormone IUD may last 3-5 years. Tell a health care provider about:  Any allergies you have.  All medicines you are taking, including vitamins, herbs, eye drops, creams, and over-the-counter medicines.  Any problems you or family members have had with anesthetic medicines.  Any blood disorders you have.  Any surgeries you have had.  Any medical conditions you have, including any STIs (sexually transmitted infections) you may have.  Whether you are pregnant or may be pregnant. What are the risks? Generally, this is a safe procedure. However, problems may occur, including:  Infection.  Bleeding.  Allergic reactions to medicines.  Accidental puncture (perforation) of the uterus, or damage to  other structures or organs.  Accidental placement of the IUD either in the muscle layer of the uterus (myometrium) or outside the uterus.  The IUD falling out of the uterus (expulsion). This is more common among women who have recently had a child.  Pregnancy that happens in the fallopian tube (ectopic pregnancy).  Infection of the uterus and fallopian tubes (pelvic inflammatory disease). What happens during the procedure?  A tool (speculum) will be placed in your vagina and widened so that your health care provider can see your cervix.  Medicine may be applied to your cervix to help lower your risk of infection (antiseptic medicine).  A tool (uterine sound) will be inserted into your uterus to determine the length of your uterus and the direction that your uterus may be tilted.  A slim instrument or tube (IUD inserter) that holds the IUD will be inserted into your vagina, through your cervical canal, and into your uterus.  The IUD will be placed in the uterus, and the IUD inserter will be removed.  The strings that are attached to the IUD will be trimmed so that they lie just below the cervix. The procedure may vary among health care providers and hospitals. What happens after the procedure?  You may have bleeding after the procedure. This is normal. It varies from light bleeding (spotting) for a few days to menstrual-like bleeding.  You may have cramping and pain.  You may feel dizzy or light-headed.  You may have lower back pain. Summary  An intrauterine device (IUD) is  a small, T-shaped device that has one or two nylon strings hanging down from it.  Two types of IUDs are available. You may have a copper IUD or a hormone IUD.  Schedule the IUD insertion for when you will have your menstrual period or right after, to make sure you are not pregnant. Placement of the IUD is better tolerated shortly after a menstrual cycle.  You may have bleeding after the procedure. This is  normal. It varies from light spotting for a few days to menstrual-like bleeding. This information is not intended to replace advice given to you by your health care provider. Make sure you discuss any questions you have with your health care provider. Document Released: 02/23/2011 Document Revised: 05/18/2016 Document Reviewed: 05/18/2016 Elsevier Interactive Patient Education  2017 [employer].  Levonorgestrel intrauterine device (IUD) What is this medicine? LEVONORGESTREL IUD (LEE voe nor jes trel) is a contraceptive (birth control) device. The device is placed inside the uterus by a healthcare professional. It is used to treat your endometrial cancer. This medicine may be used for other purposes; ask your health care provider or pharmacist if you have questions. COMMON BRAND NAME(S): Minette Headland What should I tell my health care provider before I take this medicine? They need to know if you have any of these conditions: -abnormal Pap smear -cancer of the breast, uterus, or cervix -diabetes -endometritis -genital or pelvic infection now or in the past -have more than one sexual partner or your partner has more than one partner -heart disease -history of an ectopic or tubal pregnancy -immune system problems -IUD in place -liver disease or tumor -problems with blood clots or take blood-thinners -seizures -use intravenous drugs -uterus of unusual shape -vaginal bleeding that has not been explained -an unusual or allergic reaction to levonorgestrel, other hormones, silicone, or polyethylene, medicines, foods, dyes, or preservatives -pregnant or trying to get pregnant -breast-feeding How should I use this medicine? This device is placed inside the uterus by a health care professional. Talk to your pediatrician regarding the use of this medicine in children. Special care may be needed. Overdosage: If you think you have taken too much of this medicine contact a  poison control center or emergency room at once. NOTE: This medicine is only for you. Do not share this medicine with others. What if I miss a dose? This does not apply. Depending on the brand of device you have inserted, the device will need to be replaced every 3 to 5 years if you wish to continue using this type of birth control. What may interact with this medicine? Do not take this medicine with any of the following medications: -amprenavir -bosentan -fosamprenavir This medicine may also interact with the following medications: -aprepitant -armodafinil -barbiturate medicines for inducing sleep or treating seizures -bexarotene -boceprevir -griseofulvin -medicines to treat seizures like carbamazepine, ethotoin, felbamate, oxcarbazepine, phenytoin, topiramate -modafinil -pioglitazone -rifabutin -rifampin -rifapentine -some medicines to treat HIV infection like atazanavir, efavirenz, indinavir, lopinavir, nelfinavir, tipranavir, ritonavir -St. John's wort -warfarin This list may not describe all possible interactions. Give your health care provider a list of all the medicines, herbs, non-prescription drugs, or dietary supplements you use. Also tell them if you smoke, drink alcohol, or use illegal drugs. Some items may interact with your medicine. What should I watch for while using this medicine? Visit your doctor or health care professional for regular check ups. See your doctor if you or your partner has sexual contact with others, becomes HIV positive,  or gets a sexual transmitted disease. This product does not protect you against HIV infection (AIDS) or other sexually transmitted diseases. You can check the placement of the IUD yourself by reaching up to the top of your vagina with clean fingers to feel the threads. Do not pull on the threads. It is a good habit to check placement after each menstrual period. Call your doctor right away if you feel more of the IUD than just the  threads or if you cannot feel the threads at all. The IUD may come out by itself. You may become pregnant if the device comes out. If you notice that the IUD has come out use a backup birth control method like condoms and call your health care provider. Using tampons will not change the position of the IUD and are okay to use during your period. This IUD can be safely scanned with magnetic resonance imaging (MRI) only under specific conditions. Before you have an MRI, tell your healthcare provider that you have an IUD in place, and which type of IUD you have in place. What side effects may I notice from receiving this medicine? Side effects that you should report to your doctor or health care professional as soon as possible: -allergic reactions like skin rash, itching or hives, swelling of the face, lips, or tongue -fever, flu-like symptoms -genital sores -high blood pressure -no menstrual period for 6 weeks during use -pain, swelling, warmth in the leg -pelvic pain or tenderness -severe or sudden headache -signs of pregnancy -stomach cramping -sudden shortness of breath -trouble with balance, talking, or walking -unusual vaginal bleeding, discharge -yellowing of the eyes or skin Side effects that usually do not require medical attention (report to your doctor or health care professional if they continue or are bothersome): -acne -breast pain -change in sex drive or performance -changes in weight -cramping, dizziness, or faintness while the device is being inserted -headache -irregular menstrual bleeding within first 3 to 6 months of use -nausea This list may not describe all possible side effects. Call your doctor for medical advice about side effects. You may report side effects to FDA at 1-800-FDA-1088. Where should I keep my medicine? This does not apply. NOTE: This sheet is a summary. It may not cover all possible information. If you have questions about this medicine, talk to  your doctor, pharmacist, or health care provider.  2018 Elsevier/Gold Standard (2016-04-08 14:14:56)

## 2019-10-18 ENCOUNTER — Inpatient Hospital Stay (HOSPITAL_COMMUNITY)
Admission: EM | Admit: 2019-10-18 | Discharge: 2019-10-21 | DRG: 292 | Disposition: A | Discharge status: Skilled Nursing Facility | Payer: Medicare Other | Source: Skilled Nursing Facility | Attending: Internal Medicine | Admitting: Internal Medicine

## 2019-10-18 ENCOUNTER — Encounter (HOSPITAL_COMMUNITY): Payer: Self-pay | Admitting: Emergency Medicine

## 2019-10-18 ENCOUNTER — Emergency Department (HOSPITAL_COMMUNITY): Payer: Medicare Other

## 2019-10-18 ENCOUNTER — Other Ambulatory Visit: Payer: Self-pay

## 2019-10-18 DIAGNOSIS — B9561 Methicillin susceptible Staphylococcus aureus infection as the cause of diseases classified elsewhere: Secondary | ICD-10-CM | POA: Diagnosis present

## 2019-10-18 DIAGNOSIS — I161 Hypertensive emergency: Secondary | ICD-10-CM | POA: Diagnosis not present

## 2019-10-18 DIAGNOSIS — C541 Malignant neoplasm of endometrium: Secondary | ICD-10-CM | POA: Diagnosis present

## 2019-10-18 DIAGNOSIS — T373X5A Adverse effect of other antiprotozoal drugs, initial encounter: Secondary | ICD-10-CM | POA: Diagnosis present

## 2019-10-18 DIAGNOSIS — E785 Hyperlipidemia, unspecified: Secondary | ICD-10-CM | POA: Diagnosis present

## 2019-10-18 DIAGNOSIS — Z7289 Other problems related to lifestyle: Secondary | ICD-10-CM

## 2019-10-18 DIAGNOSIS — E119 Type 2 diabetes mellitus without complications: Secondary | ICD-10-CM

## 2019-10-18 DIAGNOSIS — R601 Generalized edema: Secondary | ICD-10-CM

## 2019-10-18 DIAGNOSIS — N1832 Chronic kidney disease, stage 3b: Secondary | ICD-10-CM | POA: Diagnosis present

## 2019-10-18 DIAGNOSIS — L89892 Pressure ulcer of other site, stage 2: Secondary | ICD-10-CM | POA: Diagnosis present

## 2019-10-18 DIAGNOSIS — Z87891 Personal history of nicotine dependence: Secondary | ICD-10-CM

## 2019-10-18 DIAGNOSIS — Z20822 Contact with and (suspected) exposure to covid-19: Secondary | ICD-10-CM | POA: Diagnosis present

## 2019-10-18 DIAGNOSIS — Z8619 Personal history of other infectious and parasitic diseases: Secondary | ICD-10-CM

## 2019-10-18 DIAGNOSIS — I129 Hypertensive chronic kidney disease with stage 1 through stage 4 chronic kidney disease, or unspecified chronic kidney disease: Secondary | ICD-10-CM | POA: Diagnosis present

## 2019-10-18 DIAGNOSIS — R7881 Bacteremia: Secondary | ICD-10-CM | POA: Diagnosis present

## 2019-10-18 DIAGNOSIS — T783XXA Angioneurotic edema, initial encounter: Secondary | ICD-10-CM | POA: Diagnosis present

## 2019-10-18 DIAGNOSIS — A599 Trichomoniasis, unspecified: Secondary | ICD-10-CM | POA: Diagnosis present

## 2019-10-18 DIAGNOSIS — Z833 Family history of diabetes mellitus: Secondary | ICD-10-CM

## 2019-10-18 DIAGNOSIS — N179 Acute kidney failure, unspecified: Secondary | ICD-10-CM | POA: Diagnosis present

## 2019-10-18 DIAGNOSIS — K224 Dyskinesia of esophagus: Secondary | ICD-10-CM | POA: Diagnosis present

## 2019-10-18 DIAGNOSIS — Z6841 Body Mass Index (BMI) 40.0 and over, adult: Secondary | ICD-10-CM

## 2019-10-18 DIAGNOSIS — I509 Heart failure, unspecified: Secondary | ICD-10-CM | POA: Diagnosis present

## 2019-10-18 DIAGNOSIS — R011 Cardiac murmur, unspecified: Secondary | ICD-10-CM | POA: Diagnosis present

## 2019-10-18 DIAGNOSIS — E870 Hyperosmolality and hypernatremia: Secondary | ICD-10-CM | POA: Diagnosis present

## 2019-10-18 DIAGNOSIS — D539 Nutritional anemia, unspecified: Secondary | ICD-10-CM | POA: Diagnosis present

## 2019-10-18 DIAGNOSIS — E1122 Type 2 diabetes mellitus with diabetic chronic kidney disease: Secondary | ICD-10-CM | POA: Diagnosis present

## 2019-10-18 DIAGNOSIS — I130 Hypertensive heart and chronic kidney disease with heart failure and stage 1 through stage 4 chronic kidney disease, or unspecified chronic kidney disease: Principal | ICD-10-CM | POA: Diagnosis present

## 2019-10-18 DIAGNOSIS — Z79899 Other long term (current) drug therapy: Secondary | ICD-10-CM

## 2019-10-18 DIAGNOSIS — D500 Iron deficiency anemia secondary to blood loss (chronic): Secondary | ICD-10-CM | POA: Diagnosis present

## 2019-10-18 DIAGNOSIS — D631 Anemia in chronic kidney disease: Secondary | ICD-10-CM | POA: Diagnosis present

## 2019-10-18 DIAGNOSIS — Z9114 Patient's other noncompliance with medication regimen: Secondary | ICD-10-CM

## 2019-10-18 DIAGNOSIS — L8990 Pressure ulcer of unspecified site, unspecified stage: Secondary | ICD-10-CM | POA: Diagnosis present

## 2019-10-18 DIAGNOSIS — Z792 Long term (current) use of antibiotics: Secondary | ICD-10-CM

## 2019-10-18 DIAGNOSIS — Z8249 Family history of ischemic heart disease and other diseases of the circulatory system: Secondary | ICD-10-CM

## 2019-10-18 DIAGNOSIS — Z79890 Hormone replacement therapy (postmenopausal): Secondary | ICD-10-CM

## 2019-10-18 DIAGNOSIS — Z809 Family history of malignant neoplasm, unspecified: Secondary | ICD-10-CM

## 2019-10-18 LAB — CBC
HCT: 27 % — ABNORMAL LOW (ref 36.0–46.0)
Hemoglobin: 7.9 g/dL — ABNORMAL LOW (ref 12.0–15.0)
MCH: 28.9 pg (ref 26.0–34.0)
MCHC: 29.3 g/dL — ABNORMAL LOW (ref 30.0–36.0)
MCV: 98.9 fL (ref 80.0–100.0)
Platelets: 242 10*3/uL (ref 150–400)
RBC: 2.73 MIL/uL — ABNORMAL LOW (ref 3.87–5.11)
RDW: 21.2 % — ABNORMAL HIGH (ref 11.5–15.5)
WBC: 7.1 10*3/uL (ref 4.0–10.5)
nRBC: 0 % (ref 0.0–0.2)

## 2019-10-18 LAB — SARS CORONAVIRUS 2 (TAT 6-24 HRS): SARS Coronavirus 2: NEGATIVE

## 2019-10-18 LAB — COMPREHENSIVE METABOLIC PANEL
ALT: 5 U/L (ref 0–44)
AST: 12 U/L — ABNORMAL LOW (ref 15–41)
Albumin: 2.7 g/dL — ABNORMAL LOW (ref 3.5–5.0)
Alkaline Phosphatase: 79 U/L (ref 38–126)
Anion gap: 10 (ref 5–15)
BUN: 28 mg/dL — ABNORMAL HIGH (ref 8–23)
CO2: 22 mmol/L (ref 22–32)
Calcium: 8.6 mg/dL — ABNORMAL LOW (ref 8.9–10.3)
Chloride: 114 mmol/L — ABNORMAL HIGH (ref 98–111)
Creatinine, Ser: 2.19 mg/dL — ABNORMAL HIGH (ref 0.44–1.00)
GFR calc Af Amer: 27 mL/min — ABNORMAL LOW (ref >60)
GFR calc non Af Amer: 23 mL/min — ABNORMAL LOW (ref >60)
Glucose, Bld: 138 mg/dL — ABNORMAL HIGH (ref 70–99)
Potassium: 4 mmol/L (ref 3.5–5.1)
Sodium: 146 mmol/L — ABNORMAL HIGH (ref 135–145)
Total Bilirubin: 0.7 mg/dL (ref 0.3–1.2)
Total Protein: 7.1 g/dL (ref 6.5–8.1)

## 2019-10-18 LAB — CBG MONITORING, ED: Glucose-Capillary: 144 mg/dL — ABNORMAL HIGH (ref 70–99)

## 2019-10-18 LAB — BRAIN NATRIURETIC PEPTIDE: B Natriuretic Peptide: 847 pg/mL — ABNORMAL HIGH (ref 0.0–100.0)

## 2019-10-18 MED ORDER — PANTOPRAZOLE SODIUM 40 MG PO PACK
40.0000 mg | PACK | Freq: Every day | ORAL | Status: DC
  Administered 2019-10-19 – 2019-10-21 (×3): 40 mg via ORAL
  Filled 2019-10-18 (×3): qty 20

## 2019-10-18 MED ORDER — HYDRALAZINE HCL 25 MG PO TABS
50.0000 mg | ORAL_TABLET | Freq: Three times a day (TID) | ORAL | Status: DC
  Administered 2019-10-18 – 2019-10-21 (×10): 50 mg via ORAL
  Filled 2019-10-18 (×10): qty 2

## 2019-10-18 MED ORDER — POLYETHYLENE GLYCOL 3350 17 G PO PACK
17.0000 g | PACK | Freq: Every day | ORAL | Status: DC
  Administered 2019-10-20 – 2019-10-21 (×2): 17 g via ORAL
  Filled 2019-10-18 (×3): qty 1

## 2019-10-18 MED ORDER — LABETALOL HCL 200 MG PO TABS
200.0000 mg | ORAL_TABLET | Freq: Three times a day (TID) | ORAL | Status: DC
  Administered 2019-10-18 – 2019-10-19 (×4): 200 mg via ORAL
  Filled 2019-10-18 (×4): qty 1

## 2019-10-18 MED ORDER — FERROUS SULFATE 325 (65 FE) MG PO TABS
325.0000 mg | ORAL_TABLET | Freq: Two times a day (BID) | ORAL | Status: DC
  Administered 2019-10-18 – 2019-10-21 (×7): 325 mg via ORAL
  Filled 2019-10-18 (×7): qty 1

## 2019-10-18 MED ORDER — ACETAMINOPHEN 500 MG PO TABS
500.0000 mg | ORAL_TABLET | Freq: Four times a day (QID) | ORAL | Status: DC | PRN
  Administered 2019-10-18 – 2019-10-20 (×5): 500 mg via ORAL
  Filled 2019-10-18 (×5): qty 1

## 2019-10-18 MED ORDER — AMLODIPINE BESYLATE 5 MG PO TABS
10.0000 mg | ORAL_TABLET | Freq: Every day | ORAL | Status: DC
  Administered 2019-10-19 – 2019-10-21 (×3): 10 mg via ORAL
  Filled 2019-10-18 (×4): qty 2

## 2019-10-18 MED ORDER — OXYCODONE HCL 5 MG PO TABS
5.0000 mg | ORAL_TABLET | Freq: Four times a day (QID) | ORAL | Status: DC | PRN
  Filled 2019-10-18 (×3): qty 1

## 2019-10-18 MED ORDER — BENZOCAINE 10 % MT GEL: 1.0000 "application " | Freq: Four times a day (QID) | OROMUCOSAL | Status: DC | PRN

## 2019-10-18 MED ORDER — FAMOTIDINE IN NACL 20-0.9 MG/50ML-% IV SOLN
20.0000 mg | Freq: Once | INTRAVENOUS | Status: AC
  Administered 2019-10-18: 13:00:00 20 mg via INTRAVENOUS
  Filled 2019-10-18: qty 50

## 2019-10-18 MED ORDER — METHYLPREDNISOLONE SODIUM SUCC 125 MG IJ SOLR
125.0000 mg | Freq: Once | INTRAMUSCULAR | Status: AC
  Administered 2019-10-18: 125 mg via INTRAVENOUS
  Filled 2019-10-18: qty 2

## 2019-10-18 MED ORDER — LABETALOL HCL 5 MG/ML IV SOLN
20.0000 mg | Freq: Once | INTRAVENOUS | Status: AC
  Administered 2019-10-18: 20 mg via INTRAVENOUS
  Filled 2019-10-18: qty 4

## 2019-10-18 MED ORDER — SODIUM CHLORIDE 0.9% FLUSH
10.0000 mL | INTRAVENOUS | Status: DC | PRN
  Administered 2019-10-21: 16:00:00 10 mL

## 2019-10-18 MED ORDER — DIPHENHYDRAMINE HCL 50 MG/ML IJ SOLN
25.0000 mg | Freq: Once | INTRAMUSCULAR | Status: AC
  Administered 2019-10-18: 13:00:00 25 mg via INTRAVENOUS
  Filled 2019-10-18: qty 1

## 2019-10-18 MED ORDER — SODIUM CHLORIDE 0.9% FLUSH
10.0000 mL | Freq: Two times a day (BID) | INTRAVENOUS | Status: DC
  Administered 2019-10-19 – 2019-10-20 (×2): 10 mL

## 2019-10-18 MED ORDER — MEGESTROL ACETATE 40 MG PO TABS
80.0000 mg | ORAL_TABLET | Freq: Two times a day (BID) | ORAL | Status: DC
  Administered 2019-10-18 – 2019-10-21 (×6): 80 mg via ORAL
  Filled 2019-10-18 (×7): qty 2

## 2019-10-18 MED ORDER — CEFAZOLIN SODIUM-DEXTROSE 2-4 GM/100ML-% IV SOLN
2.0000 g | Freq: Three times a day (TID) | INTRAVENOUS | Status: DC
  Administered 2019-10-18 – 2019-10-20 (×5): 2 g via INTRAVENOUS
  Filled 2019-10-18 (×5): qty 100

## 2019-10-18 MED ORDER — FUROSEMIDE 10 MG/ML IJ SOLN
40.0000 mg | Freq: Four times a day (QID) | INTRAMUSCULAR | Status: AC
  Administered 2019-10-18 – 2019-10-19 (×4): 40 mg via INTRAVENOUS
  Filled 2019-10-18 (×4): qty 4

## 2019-10-18 MED ORDER — CHLORHEXIDINE GLUCONATE CLOTH 2 % EX PADS
6.0000 | MEDICATED_PAD | Freq: Every day | CUTANEOUS | Status: DC
  Administered 2019-10-18 – 2019-10-21 (×4): 6 via TOPICAL

## 2019-10-18 MED ORDER — HYDRALAZINE HCL 20 MG/ML IJ SOLN
10.0000 mg | INTRAMUSCULAR | Status: DC | PRN
  Administered 2019-10-18 – 2019-10-21 (×3): 10 mg via INTRAVENOUS
  Filled 2019-10-18 (×3): qty 1

## 2019-10-18 MED ORDER — ENOXAPARIN SODIUM 30 MG/0.3ML ~~LOC~~ SOLN: 30.0000 mg | SUBCUTANEOUS | Status: DC

## 2019-10-18 NOTE — ED Notes (Signed)
Pure wick has been placed. Suction set to 45mmHg.  

## 2019-10-18 NOTE — ED Notes (Signed)
Patient was given diet coke and soup.

## 2019-10-18 NOTE — Progress Notes (Addendum)
Pt arrived to unit via stretcher from ED. A/O x4. VSS. B/P elevated, gave PO Hydralazine as ordered. Oriented to room and educated on call light system. Call light within reach. Skin assessment completed. Multiple open areas noted upon admission to bilateral lower legs, charted measurements in the [facility] flowsheet. Skin appears very dry and cracked. Applied foam dressings to open areas and bilateral heels for preventative measures. Swelling noted to face, Right thigh, Right arm and bilateral lower legs. No SOB noted. SpO2 of 96% on room air. IV ABX running as ordered. Will cont to mx.

## 2019-10-18 NOTE — ED Provider Notes (Signed)
[name] Provider Note   CSN: [id number] Arrival date & time: 10/18/19  1156     History Chief Complaint  Patient presents with  . Hypertension    Patient has generalized swelling of face, arms, and legs.     Vanessa [name] is a 64 y.o. female.  The history is provided by the patient and medical records. No language interpreter was used.     64 year old female with history of hypertension, diabetes, hyperlipidemia, CKD, recently diagnosed with endometrial cancer lives at a nursing facility sent here to the ED for evaluation of facial swelling.  Patient is a poor historian.  Patient is swelling to her face around her eyes since yesterday afternoon.  She does not know why her face is swollen.  She does endorse having some increased trouble breathing but when asked if she noticed any tongue swelling patient denies.  She denies any itchiness, lightheadedness or dizziness or abdominal cramping.  She is unsure if she has any new medication.  Patient states she was given medication by the nursing staff.  She however was recently given Flagyl last night for trichomonas.  Patient however mention of facial swelling was ongoing prior to taking Flagyl.  She does not complain of any significant pain at this time no abdominal cramping.  She has a pressure ulcer to the right leg that is currently being treated.    Past Medical History:  Diagnosis Date  . Anemia 10/09/2019  . Asthma   . CKD (chronic kidney disease) stage 3, GFR 30-59 ml/min   . Dental disease   . Diabetes mellitus without complication ([place])   . Endometrial cancer ([place])   . Esophageal dysmotility   . Heart murmur   . HLD (hyperlipidemia)   . Hypertension   . MSSA bacteremia 09/2019  . Obesity, Class III, BMI 40-49.9 (morbid obesity) ([place])   . Pressure ulcer     Patient Active Problem List   Diagnosis Date Noted  . Trichimoniasis 10/17/2019  . Anemia 10/05/2019  . Esophageal  dysmotility 10/04/2019  . Pressure injury of skin 09/22/2019  . Endometrial adenocarcinoma ([place]) 09/21/2019  . Left humeral fracture 09/21/2019  . Iron deficiency anemia due to chronic blood loss 09/21/2019  . MSSA bacteremia 09/18/2019  . Postmenopausal bleeding 09/15/2019  . Thrombocytopenia ([place]) 09/15/2019  . Generalized pruritus 09/15/2019  . CKD (chronic kidney disease), stage III 09/15/2019  . Hyperuricemia 09/15/2019  . Hypokalemia 09/15/2019  . Symptomatic anemia 09/14/2019  . Non compliance w medication regimen 03/24/2015  . Dyslipidemia 03/24/2015  . DM type 2 (diabetes mellitus, type 2) ([place]) 05/23/2014  . HTN (hypertension) 05/23/2014    Past Surgical History:  Procedure Laterality Date  . BIOPSY  10/08/2019   Procedure: BIOPSY;  Surgeon: Clarene Essex, MD;  Location[facility] ENDOSCOPY;  Service: Endoscopy;;  . ESOPHAGOGASTRODUODENOSCOPY N/A 10/08/2019   Procedure: ESOPHAGOGASTRODUODENOSCOPY (EGD);  Surgeon: Clarene Essex, MD;  Location: [facility];  Service: Endoscopy;  Laterality: N/A;  . TYMPANOSTOMY TUBE PLACEMENT Bilateral      OB History    Gravida  5   Para  5   Term  4   Preterm  1   AB      Living  5     SAB      TAB      Ectopic      Multiple      Live Births  5           Family History  Problem Relation Age of Onset  . Stroke Mother   . Diabetes Mother   . Hypertension Mother   . Cancer Mother        possible ovarian  . Diabetes Brother   . Hypertension Brother   . Cancer Maternal Grandmother        possible ovarian    Social History   Tobacco Use  . Smoking status: Former Research scientist (life sciences)  . Smokeless tobacco: Never Used  . Tobacco comment: " years ago ", >82yr  Substance Use Topics  . Alcohol use: Yes    Comment: occasional  . Drug use: No    Home Medications Prior to Admission medications   Medication Sig Start Date End Date Taking? Authorizing Provider  acetaminophen (TYLENOL) 500 MG tablet Take 500 mg by mouth every 6  (six) hours as needed for moderate pain.    [provider]  amLODipine (NORVASC) 10 MG tablet Take 1 tablet (10 mg total) by mouth daily. 10/11/19   Mikhail, MVelta Addison DO  benzocaine (ORAJEL) 10 % mucosal gel Use as directed 1 application in the mouth or throat 4 (four) times daily as needed for mouth pain. 09/25/19   Arrien, MJimmy Picket MD  Blood Glucose Monitoring Suppl (BAYER CONTOUR MONITOR) w/Device KIT Use as directed for 3 times daily testing of blood glucose. E11.9 12/01/16   LMaren Reamer MD  ceFAZolin (ANCEF) 2-4 GM/100ML-% IVPB Inject 100 mLs (2 g total) into the vein every 8 (eight) hours. 10/10/19   Mikhail, MVelta Addison DO  ceFAZolin (ANCEF) IVPB Inject 2 g into the vein every 8 (eight) hours for 22 days. Indication:  MSSA bacteremia Last Day of Therapy:  10/31/19 Labs - Once weekly:  CBC/D and BMP, Labs - Every other week:  ESR and CRP 10/10/19 11/01/19  Mikhail, MVelta Addison DO  Chlorhexidine Gluconate Cloth 2 % PADS Apply 6 each topically daily. 10/10/19   MCristal Ford DO  ferrous sulfate 325 (65 FE) MG tablet Take 1 tablet (325 mg total) by mouth 2 (two) times daily with a meal. 09/25/19 10/25/19  Arrien, MJimmy Picket MD  glucose blood (BAYER CONTOUR TEST) test strip Use as instructed for 3 times daily testing of blood glucose. E11.9 12/01/16   LMaren Reamer MD  hydrALAZINE (APRESOLINE) 50 MG tablet Take 1 tablet (50 mg total) by mouth every 8 (eight) hours. 10/10/19   MCristal Ford DO  INSULIN SYRINGE 1CC/29G 29G X 1/2" 1 ML MISC Insulin syringes 03/06/14   ORolene Course PA-C  labetalol (NORMODYNE) 200 MG tablet Take 1 tablet (200 mg total) by mouth 3 (three) times daily. 09/25/19 10/25/19  Arrien, MJimmy Picket MD  Lancets ([facility]) lancets Use as instructed 11/29/16   LMaren Reamer MD  megestrol (MEGACE) 40 MG tablet Take 2 tablets (80 mg total) by mouth 2 (two) times daily. 09/25/19 10/25/19  Arrien, MJimmy Picket MD  metroNIDAZOLE (FLAGYL) 500 MG  tablet Take 1 tablet (500 mg total) by mouth 2 (two) times daily for 7 days. 10/17/19 10/24/19  TLafonda Mosses MD  oxyCODONE (OXY IR/ROXICODONE) 5 MG immediate release tablet Take 1 tablet (5 mg total) by mouth every 6 (six) hours as needed for moderate pain. 10/10/19   Mikhail, MVelta Addison DO  pantoprazole sodium (PROTONIX) 40 mg/20 mL PACK Take 20 mLs (40 mg total) by mouth daily. 10/10/19   Mikhail, MVelta Addison DO  polyethylene glycol (MIRALAX / GLYCOLAX) 17 g packet Take 17 g by mouth daily. 09/25/19   Arrien, MJimmy Picket MD  Allergies    Patient has no known allergies.  Review of Systems   Review of Systems  All other systems reviewed and are negative.   Physical Exam Updated Vital Signs BP (!) 139/104 (BP Location: Right Arm)   Pulse 82   Temp 97.9 F (36.6 C) (Oral)   Resp (!) 25   Ht 4' 11"  (1.499 m)   Wt 94.9 kg   LMP 03/17/2015   SpO2 100%   BMI 42.25 kg/m   Physical Exam Vitals and nursing note reviewed.  Constitutional:      General: She is not in acute distress.    Appearance: She is well-developed. She is obese.  HENT:     Head: Normocephalic and atraumatic.     Comments: Periorbital edema noted bilaterally with significant chemosis to both eyes.  Mild tongue swelling but no stridor Eyes:     Conjunctiva/sclera: Conjunctivae normal.  Cardiovascular:     Rate and Rhythm: Normal rate and regular rhythm.  Pulmonary:     Breath sounds: No stridor.     Comments: Tachypneic but no obvious wheezes, rales, rhonchi heard on lung exam.  Decreased lung sounds. Abdominal:     Palpations: Abdomen is soft.     Tenderness: There is no abdominal tenderness.  Musculoskeletal:        General: Swelling (Trace edema to bilateral lower extremities bilaterally) present.     Cervical back: Neck supple.  Skin:    Findings: No rash.  Neurological:     Mental Status: She is alert.  Psychiatric:        Mood and Affect: Mood normal.     ED Results / Procedures / Treatments    Labs (all labs ordered are listed, but only abnormal results are displayed) Labs Reviewed  CBC - Abnormal; Notable for the following components:      Result Value   RBC 2.73 (*)    Hemoglobin 7.9 (*)    HCT 27.0 (*)    MCHC 29.3 (*)    RDW 21.2 (*)    All other components within normal limits  COMPREHENSIVE METABOLIC PANEL - Abnormal; Notable for the following components:   Sodium 146 (*)    Chloride 114 (*)    Glucose, Bld 138 (*)    BUN 28 (*)    Creatinine, Ser 2.19 (*)    Calcium 8.6 (*)    Albumin 2.7 (*)    AST 12 (*)    GFR calc non Af Amer 23 (*)    GFR calc Af Amer 27 (*)    All other components within normal limits  BRAIN NATRIURETIC PEPTIDE - Abnormal; Notable for the following components:   B Natriuretic Peptide 847.0 (*)    All other components within normal limits  CBG MONITORING, ED - Abnormal; Notable for the following components:   Glucose-Capillary 144 (*)    All other components within normal limits  SARS CORONAVIRUS 2 (TAT 6-24 HRS)    EKG None  Radiology DG Chest Portable 1 View  Result Date: 10/18/2019 CLINICAL DATA:  Hypertension and shortness of breath EXAM: PORTABLE CHEST 1 VIEW COMPARISON:  September 17, 2019 FINDINGS: Ill-defined opacity in the right mid and lower lung regions noted with suspected small superimposed pleural effusion. There is mild left base atelectasis. Left lung otherwise clear. Heart is mildly enlarged with pulmonary vascularity normal. No adenopathy. There is degenerative change in the thoracic spine. IMPRESSION: Ill-defined opacity right mid and lower lung zones, likely due to a degree  of pneumonia with superimposed small pleural effusion. Mild left base atelectasis. Stable cardiac prominence. No adenopathy evident. Electronically Signed   By: Lowella Grip III M.D.   On: 10/18/2019 14:06    Procedures .Critical Care Performed by: [name], PA-C Authorized by: [name], PA-C   Critical care provider statement:     Critical care time (minutes):  35   Critical care was time spent personally by me on the following activities:  Discussions with consultants, evaluation of patient's response to treatment, examination of patient, ordering and performing treatments and interventions, ordering and review of laboratory studies, ordering and review of radiographic studies, pulse oximetry, re-evaluation of patient's condition, obtaining history from patient or surrogate and review of old charts   (including critical care time)  Medications Ordered in ED Medications  diphenhydrAMINE (BENADRYL) injection 25 mg (25 mg Intravenous Given 10/18/19 1258)  famotidine (PEPCID) IVPB 20 mg premix (0 mg Intravenous Stopped 10/18/19 1408)  methylPREDNISolone sodium succinate (SOLU-MEDROL) 125 mg/2 mL injection 125 mg (125 mg Intravenous Given 10/18/19 1256)  labetalol (NORMODYNE) injection 20 mg (20 mg Intravenous Given 10/18/19 1505)    ED Course  I have reviewed the triage vital signs and the nursing notes.  Pertinent labs & imaging results that were available during my care of the patient were reviewed by me and considered in my medical decision making (see chart for details).    MDM Rules/Calculators/A&P                      BP (!) 208/101   Pulse 80   Temp 97.9 F (36.6 C) (Oral)   Resp (!) 21   Ht 4' 11"  (1.499 m)   Wt 94.9 kg   LMP 03/17/2015   SpO2 98%   BMI 42.25 kg/m   Final Clinical Impression(s) / ED Diagnoses Final diagnoses:  Hypertensive emergency  Anasarca  AKI (acute kidney injury) ([place])    Rx / DC Orders ED Discharge Orders    None     1:36 PM Patient here with complaints of facial swelling since last night.  Unsure of the trigger factor.  Patient does have periorbital edema as well as ecchymosis to both eyes and mild swelling noted to her tongue and face. Swelling also noticed in L arm and bilateral lower extremities.    Patient however is not hypoxic, no urticarial rash and no hypotension.   No significant lip swelling.  Her angioedema is of unknown etiology.  No evidence of anaphylaxis.  Will give Solu-Medrol, Pepcid, and Benadryl and will monitor closely.  2:46 PM I have reviewed the patient prior charts, she was recently evaluated by Dr. Jeral Pinch.  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  She recently was started on Megace after biopsy in mid March.  Her gynecologist felt that patient should be transition from Megace to hormone IUD due to her profound lower extremity edema.  In the setting of worsening renal function, diffused anarsarca, markedly elevated BP of 208/101 and elevated BNP of 847 concerning for new onset CHF and a CXR showing questionable  Pneumonia with superimposed small pleural effusion; plan to consult for admission.  Pt denies fever or cough, unsure if she is truly having pneumonia. Will defer abx treatment to hospitalist.  3:16 PM Appreciate consultation from [facility] who agrees to see and admit pt for further  care.    [name], PA-C 10/18/19 1517    Fredia Sorrow, MD 10/19/19 [phone]

## 2019-10-18 NOTE — ED Triage Notes (Signed)
Patient arrived by EMS from [facility]. Patient c/o generalized swelling and hypertension. Patient having SOB upon arrival.   Patient has history of HTN, DM, Anemia, CKD, and MRSA.

## 2019-10-18 NOTE — H&P (Signed)
History and Physical    Vanessa [name] HYI:[id number] DOB: Sep 27, 1955 DOA: 10/18/2019  PCP: Maren Reamer, MD (Inactive)   Patient coming from: SNF  Chief Complaint: Swelling  HPI: Vanessa [name] is a 64 y.o. female with medical history significant for hypertension, type 2 diabetes mellitus -without insulin evidence, CKD 3b, morbid obesity, hyperlipidemia, recent diagnosis endometrial cancer - being followed by Dr Berline Lopes gyn-onc was just seen on 10/17/2019 for follow-up who initially recommended surgical staging hysterectomy bilateral salpingo-oophorectomy and lymph node assessment but given her high risk she appears not to be a surgical candidate at this time.  He was just treated in our facility for MSSA bacteremia evaluated for symptomatic anemia in setting of GI bleed with notable duodenal ulcer.  Patient was recently evaluated at the office for further evaluation and treatment of endometrial cancer as above, initiated on metronidazole for recent trichomonas noted on Pap smear.  Patient had 1 dose of this medication yesterday and subsequently noted swelling in the face hands and feet over the past 12 to 24 hours.  Patient subsequently presented to the ED for further evaluation and work-up given new onset swelling and edema.  ED Course: Upon arrival to the ED, patient was found to have diffuse anasarca/edema over the face upper and lower extremities with profound hypertension but without airway compromise or hypoxia, systolic blood pressure well above 200 with a MAP of 130 without tachycardia fever or leukocytosis.  Patient's labs were remarkable for anemia at 7.9, creatinine 2.19 and mild hypernatremia at 146.  Given patient's likely hypertensive emergency in the setting of new edema and profound hypertension hospitalist was called for further evaluation and admission..  Review of Systems:  All other systems reviewed and apart from HPI, patient denies shortness of breath dyspnea with exertion  orthopnea, nausea, vomiting, diarrhea, constipation, headache, fevers, chills.  Patient does endorse edema of the hands feet and face with somewhat acute onset.  Past Medical History:  Diagnosis Date  . Anemia 10/09/2019  . Asthma   . CKD (chronic kidney disease) stage 3, GFR 30-59 ml/min   . Dental disease   . Diabetes mellitus without complication ([place])   . Endometrial cancer ([place])   . Esophageal dysmotility   . Heart murmur   . HLD (hyperlipidemia)   . Hypertension   . MSSA bacteremia 09/2019  . Obesity, Class III, BMI 40-49.9 (morbid obesity) ([place])   . Pressure ulcer     Past Surgical History:  Procedure Laterality Date  . BIOPSY  10/08/2019   Procedure: BIOPSY;  Surgeon: Clarene Essex, MD;  Location: [facility] ENDOSCOPY;  Service: Endoscopy;;  . ESOPHAGOGASTRODUODENOSCOPY N/A 10/08/2019   Procedure: ESOPHAGOGASTRODUODENOSCOPY (EGD);  Surgeon: Clarene Essex, MD;  Location: [facility];  Service: Endoscopy;  Laterality: N/A;  . TYMPANOSTOMY TUBE PLACEMENT Bilateral      reports that she has quit smoking. She has never used smokeless tobacco. She reports current alcohol use. She reports that she does not use drugs.  No Known Allergies  Family History  Problem Relation Age of Onset  . Stroke Mother   . Diabetes Mother   . Hypertension Mother   . Cancer Mother        possible ovarian  . Diabetes Brother   . Hypertension Brother   . Cancer Maternal Grandmother        possible ovarian     Prior to Admission medications   Medication Sig Start Date End Date Taking? Authorizing Provider  acetaminophen (TYLENOL) 500 MG tablet  Take 500 mg by mouth every 6 (six) hours as needed for moderate pain.   Yes [provider]  benzocaine (ORAJEL) 10 % mucosal gel Use as directed 1 application in the mouth or throat 4 (four) times daily as needed for mouth pain. 09/25/19  Yes Arrien, Jimmy Picket, MD  Blood Glucose Monitoring Suppl (BAYER CONTOUR MONITOR) w/Device KIT Use as directed  for 3 times daily testing of blood glucose. E11.9 12/01/16  Yes Langeland, Dawn T, MD  ceFAZolin (ANCEF) IVPB Inject 2 g into the vein every 12 (twelve) hours. Indication:  MSSA Bacteremia Last Day of Therapy:  10/31/2019 Labs - Once weekly:  CBC/D and BMP, Labs - Every other week:  ESR and CRP 09/25/19 11/02/19 Yes Arrien, Jimmy Picket, MD  diclofenac Sodium (VOLTAREN) 1 % GEL Apply 2 g topically 4 (four) times daily. Apply to left shoulder. 09/25/19  Yes Arrien, Jimmy Picket, MD  ferrous sulfate 325 (65 FE) MG tablet Take 1 tablet (325 mg total) by mouth 2 (two) times daily with a meal. 09/25/19 10/25/19 Yes Arrien, Jimmy Picket, MD  glucose blood (BAYER CONTOUR TEST) test strip Use as instructed for 3 times daily testing of blood glucose. E11.9 12/01/16  Yes Maren Reamer, MD  INSULIN SYRINGE 1CC/29G 29G X 1/2" 1 ML MISC Insulin syringes 03/06/14  Yes Rolene Course, PA-C  labetalol (NORMODYNE) 200 MG tablet Take 1 tablet (200 mg total) by mouth 3 (three) times daily. 09/25/19 10/25/19 Yes Arrien, Jimmy Picket, MD  Lancets [facility] ULTRASOFT) lancets Use as instructed 11/29/16  Yes Maren Reamer, MD  megestrol (MEGACE) 40 MG tablet Take 2 tablets (80 mg total) by mouth 2 (two) times daily. 09/25/19 10/25/19 Yes Arrien, Jimmy Picket, MD  oxyCODONE (OXY IR/ROXICODONE) 5 MG immediate release tablet Take 1 tablet (5 mg total) by mouth every 6 (six) hours as needed for moderate pain. 09/25/19  Yes Arrien, Jimmy Picket, MD  polyethylene glycol (MIRALAX / GLYCOLAX) 17 g packet Take 17 g by mouth daily. 09/25/19  Yes Arrien, Jimmy Picket, MD    Physical Exam: Vitals:   10/18/19 1404 10/18/19 1410 10/18/19 1515 10/18/19 1645  BP: (!) 215/103 (!) 208/101 (!) 204/97 (!) 194/94  Pulse: 76 80 73 76  Resp: 14 (!) 21 17 11   Temp:      TempSrc:      SpO2: 100% 98% 97% 98%  Weight:      Height:        General:  Pleasantly resting in bed, No acute distress. HEENT: Diffuse edema, without  tongue swelling, protecting airway, managing secretions well, difficulty evaluating eyes given moderate edema  Neck: Edematous generally, without mass or deformity.  Trachea is midline. Lungs:  Clear to auscultate bilaterally without rhonchi, wheeze, or rales. Heart:  Regular rate and rhythm.  Without murmurs, rubs, or gallops. Abdomen:  Soft, nontender, nondistended.  Without guarding or rebound. Extremities: Without cyanosis, clubbing, edema, or obvious deformity.  2+ pitting edema distally in bilateral upper extremities, 3+ pitting edema bilateral lower extremities past the knees Vascular:  Dorsalis pedis and posterior tibial pulses faintly palpable bilaterally. Skin:  Warm and dry, no erythema, no ulcerations.   Labs and Imaging on Admission: I have personally reviewed following labs and imaging studies  CBC: Recent Labs  Lab 10/18/19 1241  WBC 7.1  HGB 7.9*  HCT 27.0*  MCV 98.9  PLT 469   Basic Metabolic Panel: Recent Labs  Lab 10/18/19 1241  NA 146*  K 4.0  CL  114*  CO2 22  GLUCOSE 138*  BUN 28*  CREATININE 2.19*  CALCIUM 8.6*   GFR: Estimated Creatinine Clearance: 25.8 mL/min (A) (by C-G formula based on SCr of 2.19 mg/dL (H)). Liver Function Tests: Recent Labs  Lab 10/18/19 1241  AST 12*  ALT 5  ALKPHOS 79  BILITOT 0.7  PROT 7.1  ALBUMIN 2.7*   No results for input(s): LIPASE, AMYLASE in the last 168 hours. No results for input(s): AMMONIA in the last 168 hours. Coagulation Profile: No results for input(s): INR, PROTIME in the last 168 hours. Cardiac Enzymes: No results for input(s): CKTOTAL, CKMB, CKMBINDEX, TROPONINI in the last 168 hours. BNP (last 3 results) No results for input(s): PROBNP in the last 8760 hours. HbA1C: No results for input(s): HGBA1C in the last 72 hours. CBG: Recent Labs  Lab 10/18/19 1217  GLUCAP 144*   Lipid Profile: No results for input(s): CHOL, HDL, LDLCALC, TRIG, CHOLHDL, LDLDIRECT in the last 72 hours. Thyroid  Function Tests: No results for input(s): TSH, T4TOTAL, FREET4, T3FREE, THYROIDAB in the last 72 hours. Anemia Panel: No results for input(s): VITAMINB12, FOLATE, FERRITIN, TIBC, IRON, RETICCTPCT in the last 72 hours. Urine analysis:    Component Value Date/Time   COLORURINE YELLOW 10/05/2019 1140   APPEARANCEUR HAZY (A) 10/05/2019 1140   LABSPEC 1.016 10/05/2019 1140   PHURINE 5.0 10/05/2019 1140   GLUCOSEU NEGATIVE 10/05/2019 1140   HGBUR SMALL (A) 10/05/2019 1140   BILIRUBINUR NEGATIVE 10/05/2019 1140   KETONESUR NEGATIVE 10/05/2019 1140   PROTEINUR 100 (A) 10/05/2019 1140   UROBILINOGEN 0.2 03/06/2014 1644   NITRITE NEGATIVE 10/05/2019 1140   LEUKOCYTESUR NEGATIVE 10/05/2019 1140   Recent Results (from the past 240 hour(s))  SARS CORONAVIRUS 2 (TAT 6-24 HRS) Nasopharyngeal Nasopharyngeal Swab     Status: None   Collection Time: 10/09/19  3:45 PM   Specimen: Nasopharyngeal Swab  Result Value Ref Range Status   SARS Coronavirus 2 NEGATIVE NEGATIVE Final    Comment: (NOTE) SARS-CoV-2 target nucleic acids are NOT DETECTED. The SARS-CoV-2 RNA is generally detectable in upper and lower respiratory specimens during the acute phase of infection. Negative results do not preclude SARS-CoV-2 infection, do not rule out co-infections with other pathogens, and should not be used as the sole basis for treatment or other patient management decisions. Negative results must be combined with clinical observations, patient history, and epidemiological information. The expected result is Negative. Fact Sheet for Patients: SugarRoll.be Fact Sheet for Healthcare Providers: https://www.woods-mathews.com/ This test is not yet approved or cleared by the [place] FDA and  has been authorized for detection and/or diagnosis of SARS-CoV-2 by FDA under an Emergency Use Authorization (EUA). This EUA will remain  in effect (meaning this test can be used) for  the duration of the COVID-19 declaration under Section 56 4(b)(1) of the Act, 21 U.S.C. section 360bbb-3(b)(1), unless the authorization is terminated or revoked sooner. Performed at [facility], [place] [address], [place], [place] [address]      Radiological Exams on Admission: DG Chest Portable 1 View  Result Date: 10/18/2019 CLINICAL DATA:  Hypertension and shortness of breath EXAM: PORTABLE CHEST 1 VIEW COMPARISON:  September 17, 2019 FINDINGS: Ill-defined opacity in the right mid and lower lung regions noted with suspected small superimposed pleural effusion. There is mild left base atelectasis. Left lung otherwise clear. Heart is mildly enlarged with pulmonary vascularity normal. No adenopathy. There is degenerative change in the thoracic spine. IMPRESSION: Ill-defined opacity right mid and  lower lung zones, likely due to a degree of pneumonia with superimposed small pleural effusion. Mild left base atelectasis. Stable cardiac prominence. No adenopathy evident. Electronically Signed   By: Lowella Grip III M.D.   On: 10/18/2019 14:06    EKG: Independently reviewed. Sinus rhythm.   Assessment/Plan  Principal Problem:   Hypertensive emergency Active Problems:   Angioedema   DM type 2 (diabetes mellitus, type 2) (HCC)   Non compliance w medication regimen   Dyslipidemia   Benign hypertension with CKD (chronic kidney disease) stage III   MSSA bacteremia   Endometrial adenocarcinoma (HCC)   Iron deficiency anemia due to chronic blood loss   Esophageal dysmotility   Anemia   Anasarca  Acute onset angioedema/diffuse anasarca, questionably adverse medication reaction  Concurrent hypertensive emergency, recurrent Questionable concurrent heart failure exacerbation, diastolic dysfunction grade 2 -Patient received Benadryl, methylprednisolone in the ED, no epinephrine was required as patient was protecting airway and edema appears to be improving somewhat -Patient recently started on  metronidazole in the outpatient setting, no known medical history of allergies but given only new medication and sudden onset soon after taking this new medication of diffuse edema there is concern for adverse medication reaction -Patient also has profound hypertension, possibly hypertensive emergency given diffuse anasarca edema of the limbs and face. -Patient has been initiated on labetalol, will also add hydralazine for systolic blood pressure over 160. -Patient has known noncompliance with medications, recent hospitalization patient's systolic blood pressure was anywhere from 190-180 at intake and discharged around 160-170 which appears to be near her baseline -No recent blood transfusion as this could also be related to transfusion reaction but patient declines any recent transfusion over the past 72 hours -Recent echo March 2021 shows grade 2 diastolic dysfunction EF 55 to 60%, will diurese patient as tolerated although will need to be gentle given AKI on CKD below -again concern for medication noncompliance at this point  History of recent upper GI bleed  Hgb appears to be improving appropriately, continue to follow with morning labs CBC Latest Ref Rng & Units 10/18/2019 10/10/2019 10/10/2019  WBC 4.0 - 10.5 K/uL 7.1 - -  Hemoglobin 12.0 - 15.0 g/dL 7.9(L) 7.5(L) 7.0(L)  Hematocrit 36.0 - 46.0 % 27.0(L) 24.3(L) 23.7(L)  Platelets 150 - 400 K/uL 242 - -    MSSA bacteremia, POA - Continue IV cefazolin with end-date planned for April 22  Per previous documentation -See previous hospital summary for details, no indication for further work-up at this time  AKI on CKD IIIb in the setting of above -Baseline creatinine around 1.6 currently elevated to 2.1 likely in the setting of above -Gentle diuresis as above Lab Results  Component Value Date   CREATININE 2.19 (H) 10/18/2019   CREATININE 1.62 (H) 10/09/2019   CREATININE 1.58 (H) 10/08/2019    Esophageal dysmotility - She was placed on  dysphagia 3 diet during recent admission, will continue the same here  Type II DM  - A1c was only 5.7% earlier in March 2021  -Continue diabetic diet  Endometrial cancer  - Diagnosed at previous hospitalization, initiated on Megace, was seen yesterday by her gynecologist -No further inpatient work-up required      DVT prophylaxis: SCD only given recent GI bleeding Code Status: Full Family Communication: Discussed with patient  Disposition Plan: Likely back to SNF once Hgb >7 and stable  Consults called: none  Admission status: Observation; hypertensive emergency as above - will hopefully be able to control in the  next 24-48h pending status   Little Ishikawa, DO [facility] Pager: Epic secure chat 10/18/2019, 5:01 PM

## 2019-10-18 NOTE — ED Notes (Signed)
Report called to nurse kaitlin for pt transfer to the floor

## 2019-10-19 DIAGNOSIS — K224 Dyskinesia of esophagus: Secondary | ICD-10-CM | POA: Diagnosis present

## 2019-10-19 DIAGNOSIS — Z833 Family history of diabetes mellitus: Secondary | ICD-10-CM | POA: Diagnosis not present

## 2019-10-19 DIAGNOSIS — E1122 Type 2 diabetes mellitus with diabetic chronic kidney disease: Secondary | ICD-10-CM | POA: Diagnosis present

## 2019-10-19 DIAGNOSIS — Z6841 Body Mass Index (BMI) 40.0 and over, adult: Secondary | ICD-10-CM | POA: Diagnosis not present

## 2019-10-19 DIAGNOSIS — R601 Generalized edema: Secondary | ICD-10-CM | POA: Diagnosis present

## 2019-10-19 DIAGNOSIS — E870 Hyperosmolality and hypernatremia: Secondary | ICD-10-CM | POA: Diagnosis present

## 2019-10-19 DIAGNOSIS — R011 Cardiac murmur, unspecified: Secondary | ICD-10-CM | POA: Diagnosis present

## 2019-10-19 DIAGNOSIS — Z8249 Family history of ischemic heart disease and other diseases of the circulatory system: Secondary | ICD-10-CM | POA: Diagnosis not present

## 2019-10-19 DIAGNOSIS — C541 Malignant neoplasm of endometrium: Secondary | ICD-10-CM | POA: Diagnosis present

## 2019-10-19 DIAGNOSIS — N179 Acute kidney failure, unspecified: Secondary | ICD-10-CM | POA: Diagnosis present

## 2019-10-19 DIAGNOSIS — T783XXA Angioneurotic edema, initial encounter: Secondary | ICD-10-CM | POA: Diagnosis present

## 2019-10-19 DIAGNOSIS — Z8619 Personal history of other infectious and parasitic diseases: Secondary | ICD-10-CM | POA: Diagnosis not present

## 2019-10-19 DIAGNOSIS — I161 Hypertensive emergency: Secondary | ICD-10-CM | POA: Diagnosis present

## 2019-10-19 DIAGNOSIS — B9561 Methicillin susceptible Staphylococcus aureus infection as the cause of diseases classified elsewhere: Secondary | ICD-10-CM | POA: Diagnosis present

## 2019-10-19 DIAGNOSIS — I509 Heart failure, unspecified: Secondary | ICD-10-CM | POA: Diagnosis present

## 2019-10-19 DIAGNOSIS — I130 Hypertensive heart and chronic kidney disease with heart failure and stage 1 through stage 4 chronic kidney disease, or unspecified chronic kidney disease: Secondary | ICD-10-CM | POA: Diagnosis present

## 2019-10-19 DIAGNOSIS — E785 Hyperlipidemia, unspecified: Secondary | ICD-10-CM | POA: Diagnosis present

## 2019-10-19 DIAGNOSIS — R7881 Bacteremia: Secondary | ICD-10-CM | POA: Diagnosis present

## 2019-10-19 DIAGNOSIS — N1832 Chronic kidney disease, stage 3b: Secondary | ICD-10-CM | POA: Diagnosis present

## 2019-10-19 DIAGNOSIS — A599 Trichomoniasis, unspecified: Secondary | ICD-10-CM | POA: Diagnosis present

## 2019-10-19 DIAGNOSIS — D500 Iron deficiency anemia secondary to blood loss (chronic): Secondary | ICD-10-CM | POA: Diagnosis present

## 2019-10-19 DIAGNOSIS — Z20822 Contact with and (suspected) exposure to covid-19: Secondary | ICD-10-CM | POA: Diagnosis present

## 2019-10-19 DIAGNOSIS — D631 Anemia in chronic kidney disease: Secondary | ICD-10-CM | POA: Diagnosis present

## 2019-10-19 DIAGNOSIS — L89892 Pressure ulcer of other site, stage 2: Secondary | ICD-10-CM | POA: Diagnosis present

## 2019-10-19 LAB — CBC
HCT: 26.5 % — ABNORMAL LOW (ref 36.0–46.0)
Hemoglobin: 8 g/dL — ABNORMAL LOW (ref 12.0–15.0)
MCH: 29.6 pg (ref 26.0–34.0)
MCHC: 30.2 g/dL (ref 30.0–36.0)
MCV: 98.1 fL (ref 80.0–100.0)
Platelets: 281 10*3/uL (ref 150–400)
RBC: 2.7 MIL/uL — ABNORMAL LOW (ref 3.87–5.11)
RDW: 21.5 % — ABNORMAL HIGH (ref 11.5–15.5)
WBC: 7.8 10*3/uL (ref 4.0–10.5)
nRBC: 0 % (ref 0.0–0.2)

## 2019-10-19 LAB — COMPREHENSIVE METABOLIC PANEL
ALT: <5 U/L (ref 0–44)
AST: 14 U/L — ABNORMAL LOW (ref 15–41)
Albumin: 3 g/dL — ABNORMAL LOW (ref 3.5–5.0)
Alkaline Phosphatase: 69 U/L (ref 38–126)
Anion gap: 12 (ref 5–15)
BUN: 31 mg/dL — ABNORMAL HIGH (ref 8–23)
CO2: 21 mmol/L — ABNORMAL LOW (ref 22–32)
Calcium: 8.9 mg/dL (ref 8.9–10.3)
Chloride: 112 mmol/L — ABNORMAL HIGH (ref 98–111)
Creatinine, Ser: 2.21 mg/dL — ABNORMAL HIGH (ref 0.44–1.00)
GFR calc Af Amer: 26 mL/min — ABNORMAL LOW (ref >60)
GFR calc non Af Amer: 23 mL/min — ABNORMAL LOW (ref >60)
Glucose, Bld: 183 mg/dL — ABNORMAL HIGH (ref 70–99)
Potassium: 4.1 mmol/L (ref 3.5–5.1)
Sodium: 145 mmol/L (ref 135–145)
Total Bilirubin: 0.4 mg/dL (ref 0.3–1.2)
Total Protein: 7.6 g/dL (ref 6.5–8.1)

## 2019-10-19 MED ORDER — CARBAMIDE PEROXIDE 6.5 % OT SOLN
5.0000 [drp] | Freq: Two times a day (BID) | OTIC | Status: DC | PRN
  Administered 2019-10-19 – 2019-10-20 (×2): 5 [drp] via OTIC
  Filled 2019-10-19: qty 15

## 2019-10-19 NOTE — Progress Notes (Signed)
Resumed care for this pt at 0300. I agree with previous RN's assessment. Pt currently sitting up in chair. No complaints at this time.

## 2019-10-19 NOTE — Progress Notes (Signed)
PROGRESS NOTE    Vanessa [name]  YPP:[id number] DOB: 12-06-1955 DOA: 10/18/2019 PCP: Maren Reamer, MD (Inactive)   Brief Narrative:   Vanessa [name] is a 64 y.o. female with medical history significant for hypertension, type 2 diabetes mellitus -without insulin evidence, CKD 3b, morbid obesity, hyperlipidemia, recent diagnosis endometrial cancer - being followed by Dr Berline Lopes gyn-onc was just seen on 10/17/2019 for follow-up who initially recommended surgical staging hysterectomy bilateral salpingo-oophorectomy and lymph node assessment but given her high risk she appears not to be a surgical candidate at this time.  He was just treated in our facility for MSSA bacteremia evaluated for symptomatic anemia in setting of GI bleed with notable duodenal ulcer.  Patient was recently evaluated at the office for further evaluation and treatment of endometrial cancer as above, initiated on metronidazole for recent trichomonas noted on Pap smear.  Patient had 1 dose of this medication yesterday and subsequently noted swelling in the face hands and feet over the past 12 to 24 hours.  Patient subsequently presented to the ED for further evaluation and work-up given new onset swelling and edema. Upon arrival to the ED, patient was found to have diffuse anasarca/edema over the face upper and lower extremities with profound hypertension but without airway compromise or hypoxia, systolic blood pressure well above 200 with a MAP of 130 without tachycardia fever or leukocytosis.  Patient's labs were remarkable for anemia at 7.9, creatinine 2.19 and mild hypernatremia at 146.  Given patient's likely hypertensive emergency in the setting of new edema and profound hypertension hospitalist was called for further evaluation and admission..   Assessment & Plan:   Principal Problem:   Hypertensive emergency Active Problems:   Angioedema   DM type 2 (diabetes mellitus, type 2) (HCC)   Non compliance w medication  regimen   Dyslipidemia   Benign hypertension with CKD (chronic kidney disease) stage III   MSSA bacteremia   Endometrial adenocarcinoma (HCC)   Iron deficiency anemia due to chronic blood loss   Pressure injury of skin   Esophageal dysmotility   Anemia   Anasarca  Acute onset angioedema/diffuse anasarca, questionably adverse medication reaction  Concurrent hypertensive emergency, recurrent Questionable concurrent heart failure exacerbation, diastolic dysfunction grade 2 -Patient recently started on metronidazole in the outpatient setting, no known medical history of allergies but given only new medication and sudden onset soon after taking this new medication of diffuse edema there is concern for adverse medication reaction -Edema improving but not yet resolved, patient's face continues to be moderately edematous, difficulty using hands and ambulating given profound edema and limited range of motion in the setting of swelling -Pressure improving on hydralazine and reinitiation of home medications, given ongoing facial and hand edema in the setting of blood pressure control it appears patient's diagnosis is likely anaphylactic/allergic in nature -Patient has known noncompliance with medications, recent hospitalization patient's systolic blood pressure was anywhere from 190-180 at intake and discharged around 160-170 which appears to be near her baseline -Recent echo March 2021 shows grade 2 diastolic dysfunction EF 55 to 60%, will diurese patient as tolerated although will need to be gentle given AKI on CKD below  Intake/Output Summary (Last 24 hours) at 10/19/2019 1138 Last data filed at 10/19/2019 0600 Gross per 24 hour  Intake 691.24 ml  Output 1950 ml  Net -1258.76 ml    History of recent upper GI bleed  Hgb appears to be improving appropriately, continue to follow with morning labs CBC Latest Ref  Rng & Units 10/19/2019 10/18/2019 10/10/2019  WBC 4.0 - 10.5 K/uL 7.8 7.1 -  Hemoglobin 12.0  - 15.0 g/dL 8.0(L) 7.9(L) 7.5(L)  Hematocrit 36.0 - 46.0 % 26.5(L) 27.0(L) 24.3(L)  Platelets 150 - 400 K/uL 281 242 -   MSSA bacteremia, POA - Continue IV cefazolin with end-date planned for April 22  Per previous documentation -See previous hospital summary for details, no indication for further work-up at this time  AKI on CKD IIIb in the setting of above -Baseline creatinine around 1.6 currently elevated to 2.2 likely in the setting of above -Continue diuresis as above Lab Results  Component Value Date   CREATININE 2.21 (H) 10/19/2019   CREATININE 2.19 (H) 10/18/2019   CREATININE 1.62 (H) 10/09/2019    Esophageal dysmotility - She was placed on dysphagia 3 diet during recent admission, will continue the same here  Type II DM  - A1c was only 5.7% earlier in March 2021  -Continue diabetic diet  Endometrial cancer  - Diagnosed at previous hospitalization, initiated on Megace, was seen yesterday by her gynecologist -No further inpatient work-up required    DVT prophylaxis: SCD only given recent GI bleeding Code Status: Full Family Communication: Discussed with patient  Disposition Plan: Likely back to SNF once swelling, edema, and blood pressure are reasonably well controlled Consults called: none  Admission status:  Transition to inpatient given ongoing need for IV antihypertensives, ongoing edema and concern for ongoing acute anaphylactic reaction in the setting of new medication.  Subjective: No acute issues or events overnight, patient continues to have swelling as above she indicates her ambulatory status and ability to use her hands been somewhat limited due to swelling but otherwise denies any shortness of breath, nausea, vomiting, diarrhea, constipation, headache, fevers, chills.  Objective: Vitals:   10/19/19 0033 10/19/19 0045 10/19/19 0516 10/19/19 0912  BP: (!) 200/97 (!) 180/92 (!) 151/96 (!) 197/94  Pulse: 92  89   Resp: 17  17 18   Temp: 98.4 F (36.9 C)   98 F (36.7 C) (!) 97.5 F (36.4 C)  TempSrc: Oral  Oral Axillary  SpO2: 98%  98%   Weight:      Height:        Intake/Output Summary (Last 24 hours) at 10/19/2019 1132 Last data filed at 10/19/2019 0600 Gross per 24 hour  Intake 691.24 ml  Output 1950 ml  Net -1258.76 ml   Filed Weights   10/18/19 1221 10/18/19 1723  Weight: 94.9 kg 100.3 kg    Examination:  General exam: Appears calm and comfortable  Respiratory system: Clear to auscultation. Respiratory effort normal. Cardiovascular system: S1 & S2 heard, RRR. No JVD, murmurs, rubs, gallops or clicks. No pedal edema. Gastrointestinal system: Abdomen is nondistended, soft and nontender. No organomegaly or masses felt. Normal bowel sounds heard. Central nervous system: Alert and oriented. No focal neurological deficits. Extremities: Symmetric 5 x 5 power. Skin: No rashes, lesions or ulcers Psychiatry: Judgement and insight appear normal. Mood & affect appropriate.     Data Reviewed: I have personally reviewed following labs and imaging studies  CBC: Recent Labs  Lab 10/18/19 1241 10/19/19 0354  WBC 7.1 7.8  HGB 7.9* 8.0*  HCT 27.0* 26.5*  MCV 98.9 98.1  PLT 242 735   Basic Metabolic Panel: Recent Labs  Lab 10/18/19 1241 10/19/19 0354  NA 146* 145  K 4.0 4.1  CL 114* 112*  CO2 22 21*  GLUCOSE 138* 183*  BUN 28* 31*  CREATININE 2.19* 2.21*  CALCIUM 8.6* 8.9   GFR: Estimated Creatinine Clearance: 26.4 mL/min (A) (by C-G formula based on SCr of 2.21 mg/dL (H)). Liver Function Tests: Recent Labs  Lab 10/18/19 1241 10/19/19 0354  AST 12* 14*  ALT 5 <5  ALKPHOS 79 69  BILITOT 0.7 0.4  PROT 7.1 7.6  ALBUMIN 2.7* 3.0*   No results for input(s): LIPASE, AMYLASE in the last 168 hours. No results for input(s): AMMONIA in the last 168 hours. Coagulation Profile: No results for input(s): INR, PROTIME in the last 168 hours. Cardiac Enzymes: No results for input(s): CKTOTAL, CKMB, CKMBINDEX, TROPONINI in  the last 168 hours. BNP (last 3 results) No results for input(s): PROBNP in the last 8760 hours. HbA1C: No results for input(s): HGBA1C in the last 72 hours. CBG: Recent Labs  Lab 10/18/19 1217  GLUCAP 144*   Lipid Profile: No results for input(s): CHOL, HDL, LDLCALC, TRIG, CHOLHDL, LDLDIRECT in the last 72 hours. Thyroid Function Tests: No results for input(s): TSH, T4TOTAL, FREET4, T3FREE, THYROIDAB in the last 72 hours. Anemia Panel: No results for input(s): VITAMINB12, FOLATE, FERRITIN, TIBC, IRON, RETICCTPCT in the last 72 hours. Sepsis Labs: No results for input(s): PROCALCITON, LATICACIDVEN in the last 168 hours.  Recent Results (from the past 240 hour(s))  SARS CORONAVIRUS 2 (TAT 6-24 HRS) Nasopharyngeal Nasopharyngeal Swab     Status: None   Collection Time: 10/09/19  3:45 PM   Specimen: Nasopharyngeal Swab  Result Value Ref Range Status   SARS Coronavirus 2 NEGATIVE NEGATIVE Final    Comment: (NOTE) SARS-CoV-2 target nucleic acids are NOT DETECTED. The SARS-CoV-2 RNA is generally detectable in upper and lower respiratory specimens during the acute phase of infection. Negative results do not preclude SARS-CoV-2 infection, do not rule out co-infections with other pathogens, and should not be used as the sole basis for treatment or other patient management decisions. Negative results must be combined with clinical observations, patient history, and epidemiological information. The expected result is Negative. Fact Sheet for Patients: SugarRoll.be Fact Sheet for Healthcare Providers: https://www.woods-mathews.com/ This test is not yet approved or cleared by the [place] FDA and  has been authorized for detection and/or diagnosis of SARS-CoV-2 by FDA under an Emergency Use Authorization (EUA). This EUA will remain  in effect (meaning this test can be used) for the duration of the COVID-19 declaration under Section 56  4(b)(1) of the Act, 21 U.S.C. section 360bbb-3(b)(1), unless the authorization is terminated or revoked sooner. Performed at [facility], [place] [address], [place], [place] [address]   SARS CORONAVIRUS 2 (TAT 6-24 HRS) Nasopharyngeal Nasopharyngeal Swab     Status: None   Collection Time: 10/18/19  3:07 PM   Specimen: Nasopharyngeal Swab  Result Value Ref Range Status   SARS Coronavirus 2 NEGATIVE NEGATIVE Final    Comment: (NOTE) SARS-CoV-2 target nucleic acids are NOT DETECTED. The SARS-CoV-2 RNA is generally detectable in upper and lower respiratory specimens during the acute phase of infection. Negative results do not preclude SARS-CoV-2 infection, do not rule out co-infections with other pathogens, and should not be used as the sole basis for treatment or other patient management decisions. Negative results must be combined with clinical observations, patient history, and epidemiological information. The expected result is Negative. Fact Sheet for Patients: SugarRoll.be Fact Sheet for Healthcare Providers: https://www.woods-mathews.com/ This test is not yet approved or cleared by the [place] FDA and  has been authorized for detection and/or diagnosis of SARS-CoV-2 by FDA under an Emergency Use  Authorization (EUA). This EUA will remain  in effect (meaning this test can be used) for the duration of the COVID-19 declaration under Section 56 4(b)(1) of the Act, 21 U.S.C. section 360bbb-3(b)(1), unless the authorization is terminated or revoked sooner. Performed at [facility], [place] [address], [place], [place] [address]          Radiology Studies: DG Chest Portable 1 View  Result Date: 10/18/2019 CLINICAL DATA:  Hypertension and shortness of breath EXAM: PORTABLE CHEST 1 VIEW COMPARISON:  September 17, 2019 FINDINGS: Ill-defined opacity in the right mid and lower lung regions noted with suspected small superimposed  pleural effusion. There is mild left base atelectasis. Left lung otherwise clear. Heart is mildly enlarged with pulmonary vascularity normal. No adenopathy. There is degenerative change in the thoracic spine. IMPRESSION: Ill-defined opacity right mid and lower lung zones, likely due to a degree of pneumonia with superimposed small pleural effusion. Mild left base atelectasis. Stable cardiac prominence. No adenopathy evident. Electronically Signed   By: Lowella Grip III M.D.   On: 10/18/2019 14:06        Scheduled Meds: . amLODipine  10 mg Oral Daily  . Chlorhexidine Gluconate Cloth  6 each Topical Daily  . ferrous sulfate  325 mg Oral BID WC  . furosemide  40 mg Intravenous Q6H  . hydrALAZINE  50 mg Oral Q8H  . labetalol  200 mg Oral TID  . megestrol  80 mg Oral BID  . pantoprazole sodium  40 mg Oral Daily  . polyethylene glycol  17 g Oral Daily  . sodium chloride flush  10-40 mL Intracatheter Q12H   Continuous Infusions: . ceFAZolin 2 g (10/19/19 0844)     LOS: 0 days   Time spent: 64min  Shalah Estelle C Reon Hunley, DO [facility]  If 7PM-7AM, please contact night-coverage [email or web address]  10/19/2019, 11:32 AM

## 2019-10-20 LAB — COMPREHENSIVE METABOLIC PANEL
ALT: <5 U/L (ref 0–44)
AST: 11 U/L — ABNORMAL LOW (ref 15–41)
Albumin: 2.7 g/dL — ABNORMAL LOW (ref 3.5–5.0)
Alkaline Phosphatase: 66 U/L (ref 38–126)
Anion gap: 10 (ref 5–15)
BUN: 33 mg/dL — ABNORMAL HIGH (ref 8–23)
CO2: 23 mmol/L (ref 22–32)
Calcium: 8.4 mg/dL — ABNORMAL LOW (ref 8.9–10.3)
Chloride: 110 mmol/L (ref 98–111)
Creatinine, Ser: 2.22 mg/dL — ABNORMAL HIGH (ref 0.44–1.00)
GFR calc Af Amer: 26 mL/min — ABNORMAL LOW (ref >60)
GFR calc non Af Amer: 23 mL/min — ABNORMAL LOW (ref >60)
Glucose, Bld: 140 mg/dL — ABNORMAL HIGH (ref 70–99)
Potassium: 3.6 mmol/L (ref 3.5–5.1)
Sodium: 143 mmol/L (ref 135–145)
Total Bilirubin: 0.4 mg/dL (ref 0.3–1.2)
Total Protein: 6.9 g/dL (ref 6.5–8.1)

## 2019-10-20 LAB — CBC
HCT: 24.7 % — ABNORMAL LOW (ref 36.0–46.0)
Hemoglobin: 7.3 g/dL — ABNORMAL LOW (ref 12.0–15.0)
MCH: 29.6 pg (ref 26.0–34.0)
MCHC: 29.6 g/dL — ABNORMAL LOW (ref 30.0–36.0)
MCV: 100 fL (ref 80.0–100.0)
Platelets: 240 10*3/uL (ref 150–400)
RBC: 2.47 MIL/uL — ABNORMAL LOW (ref 3.87–5.11)
RDW: 21.5 % — ABNORMAL HIGH (ref 11.5–15.5)
WBC: 7.4 10*3/uL (ref 4.0–10.5)
nRBC: 0 % (ref 0.0–0.2)

## 2019-10-20 LAB — HEMOGLOBIN AND HEMATOCRIT, BLOOD
HCT: 25.5 % — ABNORMAL LOW (ref 36.0–46.0)
Hemoglobin: 7.5 g/dL — ABNORMAL LOW (ref 12.0–15.0)

## 2019-10-20 LAB — OCCULT BLOOD X 1 CARD TO LAB, STOOL: Fecal Occult Bld: NEGATIVE

## 2019-10-20 MED ORDER — CEFAZOLIN SODIUM-DEXTROSE 2-4 GM/100ML-% IV SOLN
2.0000 g | Freq: Two times a day (BID) | INTRAVENOUS | Status: DC
  Administered 2019-10-20 – 2019-10-21 (×3): 2 g via INTRAVENOUS
  Filled 2019-10-20 (×4): qty 100

## 2019-10-20 MED ORDER — FUROSEMIDE 10 MG/ML IJ SOLN
20.0000 mg | Freq: Four times a day (QID) | INTRAMUSCULAR | Status: DC
  Administered 2019-10-20 – 2019-10-21 (×6): 20 mg via INTRAVENOUS
  Filled 2019-10-20 (×6): qty 2

## 2019-10-20 MED ORDER — CARVEDILOL 12.5 MG PO TABS
12.5000 mg | ORAL_TABLET | Freq: Two times a day (BID) | ORAL | Status: DC
  Administered 2019-10-20 – 2019-10-21 (×3): 12.5 mg via ORAL
  Filled 2019-10-20 (×3): qty 1

## 2019-10-20 NOTE — Progress Notes (Addendum)
PROGRESS NOTE    Vanessa [name]  ZDG:[id number] DOB: 07-15-1955 DOA: 10/18/2019 PCP: Maren Reamer, MD (Inactive)   Brief Narrative:   Vanessa [name] is a 64 y.o. female with medical history significant for hypertension, type 2 diabetes mellitus -without insulin evidence, CKD 3b, morbid obesity, hyperlipidemia, recent diagnosis endometrial cancer - being followed by Dr Berline Lopes gyn-onc was just seen on 10/17/2019 for follow-up who initially recommended surgical staging hysterectomy bilateral salpingo-oophorectomy and lymph node assessment but given her high risk she appears not to be a surgical candidate at this time.  He was just treated in our facility for MSSA bacteremia evaluated for symptomatic anemia in setting of GI bleed with notable duodenal ulcer.  Patient was recently evaluated at the office for further evaluation and treatment of endometrial cancer as above, initiated on metronidazole for recent trichomonas noted on Pap smear.  Patient had 1 dose of this medication yesterday and subsequently noted swelling in the face hands and feet over the past 12 to 24 hours.  Patient subsequently presented to the ED for further evaluation and work-up given new onset swelling and edema. Upon arrival to the ED, patient was found to have diffuse anasarca/edema over the face upper and lower extremities with profound hypertension but without airway compromise or hypoxia, systolic blood pressure well above 200 with a MAP of 130 without tachycardia fever or leukocytosis.  Patient's labs were remarkable for anemia at 7.9, creatinine 2.19 and mild hypernatremia at 146.  Given patient's likely hypertensive emergency in the setting of new edema and profound hypertension hospitalist was called for further evaluation and admission..   Assessment & Plan:   Principal Problem:   Hypertensive emergency Active Problems:   Angioedema   DM type 2 (diabetes mellitus, type 2) (HCC)   Non compliance w medication  regimen   Dyslipidemia   Benign hypertension with CKD (chronic kidney disease) stage III   MSSA bacteremia   Endometrial adenocarcinoma (HCC)   Iron deficiency anemia due to chronic blood loss   Pressure injury of skin   Esophageal dysmotility   Anemia   Anasarca   Allergic angioedema  Acute onset angioedema/diffuse anasarca, questionably adverse medication reaction  Questionable concurrent heart failure exacerbation, diastolic dysfunction grade 2 -Patient recently started on metronidazole in the outpatient setting, no known medical history of allergies but given only new medication and sudden onset soon after taking this new medication of diffuse edema there is concern for adverse medication reaction -Edema improving but not yet resolved, patient's face appears to be within normal limits, difficulty using hands and ambulating given ongoing edema and limited range of motion in the setting of swelling -Decrease IV Lasix to 20q6h (previously 40q6 x4 doses) -Recent echo March 2021 shows grade 2 diastolic dysfunction EF 55 to 60%, will diurese patient as tolerated although will need to be gentle given AKI on CKD below  Intake/Output Summary (Last 24 hours) at 10/20/2019 1046 Last data filed at 10/20/2019 0053 Gross per 24 hour  Intake 460 ml  Output 1925 ml  Net -1465 ml   Hypertensive emergency, recurrent -Blood pressure minimally improving on PRN hydralazine and reinitiation of home medications,  -Transition from labetalol to carvedilol in hopes to improve patient's blood pressure -Once patient's creatinine returns back to baseline would consider initiation of ACE versus ARB -Patient has known noncompliance with medications, recent hospitalization patient's systolic blood pressure was anywhere from 190-180 at intake and discharged around 160-170 which appears to be near her baseline  History  of recent upper GI bleed with downtrending hemoglobin Hgb appears to be downtrending overnight,  repeat H&H as below Follow FOBT, bowel movements overnight and per patient are "normal brown" If patient is symptomatic or hemoglobin less than 7 will transfuse in the next 24-48 hours, attempting to avoid transfusion at this time given patient's volume status CBC Latest Ref Rng & Units 10/20/2019 10/20/2019 10/19/2019  WBC 4.0 - 10.5 K/uL - 7.4 7.8  Hemoglobin 12.0 - 15.0 g/dL 7.5(L) 7.3(L) 8.0(L)  Hematocrit 36.0 - 46.0 % 25.5(L) 24.7(L) 26.5(L)  Platelets 150 - 400 K/uL - 240 281   MSSA bacteremia, POA - Continue IV cefazolin with end-date planned for April 22  Per previous documentation -See previous hospital summary for details, no indication for further work-up at this time -Cefazolin dosed per pharmacy  AKI on CKD IIIb in the setting of above -Baseline creatinine around 1.6 currently elevated to 2.2 likely in the setting of above -Continue diuresis as above -Pharmacy monitoring and dosing of medications accordingly Lab Results  Component Value Date   CREATININE 2.22 (H) 10/20/2019   CREATININE 2.21 (H) 10/19/2019   CREATININE 2.19 (H) 10/18/2019    Esophageal dysmotility - She was placed on dysphagia 3 diet during recent admission, will continue the same here  Type II DM  - A1c was only 5.7% earlier in March 2021  -Continue diabetic diet  Endometrial cancer  - Diagnosed at previous hospitalization, initiated on Megace, was seen yesterday by her gynecologist -No further inpatient work-up required    DVT prophylaxis: SCD only given recent GI bleeding and ongoing anemia Code Status: Full Family Communication: Discussed with patient  Disposition Plan: Likely back to SNF once edema is resolving, blood pressure is well controlled and patient's hemoglobin stabilizes consider discharge in the next 24 to 48 hours if these can be managed appropriately without need for further intervention Consults called: none  Admission status:  Continue as inpatient given need for ongoing IV  antihypertensives, worsening anemia concerning for occult bleed, and edema requiring IV diuretics.  Subjective: No acute issues or events per staff, patient indicates she feels somewhat improved, feels that her facial swelling is gone completely but her hands and legs remain "puffy" and she has difficulty closing her hand or ambulating due to the swelling.  Also discussed her ongoing anemia and poor blood pressure control which she is very concerned about but denies any changes in bowel movement notes only brown stools she is noted and does not have any headache chest pain shortness of breath or dyspnea with exertion.  Objective: Vitals:   10/19/19 1204 10/19/19 1338 10/19/19 2020 10/20/19 0622  BP: (!) 164/89 (!) 184/84 129/73 (!) 180/88  Pulse:  84 84 78  Resp:  18 20 20   Temp:  97.8 F (36.6 C) 99.2 F (37.3 C) 98.9 F (37.2 C)  TempSrc:  Oral Oral Oral  SpO2:  97% 98% 99%  Weight:      Height:        Intake/Output Summary (Last 24 hours) at 10/20/2019 1046 Last data filed at 10/20/2019 0053 Gross per 24 hour  Intake 460 ml  Output 1925 ml  Net -1465 ml   Filed Weights   10/18/19 1221 10/18/19 1723  Weight: 94.9 kg 100.3 kg    Examination:  General:  Pleasantly resting in bed, No acute distress. HEENT:  Normocephalic atraumatic.  Sclerae nonicteric, noninjected.  Extraocular movements intact bilaterally. Neck:  Without mass or deformity.  Trachea is midline. Lungs:  Clear to auscultate bilaterally without rhonchi, wheeze, or rales. Heart:  Regular rate and rhythm.  Without murmurs, rubs, or gallops. Abdomen:  Soft, nontender, nondistended.  Without guarding or rebound. Extremities: 1-2+ pitting edema in right-sided extremities, left upper and left lower extremity 2-3+ pitting edema(patient indicates that she sleeps on the side) Vascular:  Dorsalis pedis and posterior tibial pulses palpable bilaterally.   Data Reviewed: I have personally reviewed following labs and  imaging studies  CBC: Recent Labs  Lab 10/18/19 1241 10/19/19 0354 10/20/19 0430 10/20/19 1017  WBC 7.1 7.8 7.4  --   HGB 7.9* 8.0* 7.3* 7.5*  HCT 27.0* 26.5* 24.7* 25.5*  MCV 98.9 98.1 100.0  --   PLT 242 281 240  --    Basic Metabolic Panel: Recent Labs  Lab 10/18/19 1241 10/19/19 0354 10/20/19 0430  NA 146* 145 143  K 4.0 4.1 3.6  CL 114* 112* 110  CO2 22 21* 23  GLUCOSE 138* 183* 140*  BUN 28* 31* 33*  CREATININE 2.19* 2.21* 2.22*  CALCIUM 8.6* 8.9 8.4*   GFR: Estimated Creatinine Clearance: 26.3 mL/min (A) (by C-G formula based on SCr of 2.22 mg/dL (H)). Liver Function Tests: Recent Labs  Lab 10/18/19 1241 10/19/19 0354 10/20/19 0430  AST 12* 14* 11*  ALT 5 <5 <5  ALKPHOS 79 69 66  BILITOT 0.7 0.4 0.4  PROT 7.1 7.6 6.9  ALBUMIN 2.7* 3.0* 2.7*   No results for input(s): LIPASE, AMYLASE in the last 168 hours. No results for input(s): AMMONIA in the last 168 hours. Coagulation Profile: No results for input(s): INR, PROTIME in the last 168 hours. Cardiac Enzymes: No results for input(s): CKTOTAL, CKMB, CKMBINDEX, TROPONINI in the last 168 hours. BNP (last 3 results) No results for input(s): PROBNP in the last 8760 hours. HbA1C: No results for input(s): HGBA1C in the last 72 hours. CBG: Recent Labs  Lab 10/18/19 1217  GLUCAP 144*   Lipid Profile: No results for input(s): CHOL, HDL, LDLCALC, TRIG, CHOLHDL, LDLDIRECT in the last 72 hours. Thyroid Function Tests: No results for input(s): TSH, T4TOTAL, FREET4, T3FREE, THYROIDAB in the last 72 hours. Anemia Panel: No results for input(s): VITAMINB12, FOLATE, FERRITIN, TIBC, IRON, RETICCTPCT in the last 72 hours. Sepsis Labs: No results for input(s): PROCALCITON, LATICACIDVEN in the last 168 hours.  Recent Results (from the past 240 hour(s))  SARS CORONAVIRUS 2 (TAT 6-24 HRS) Nasopharyngeal Nasopharyngeal Swab     Status: None   Collection Time: 10/18/19  3:07 PM   Specimen: Nasopharyngeal Swab    Result Value Ref Range Status   SARS Coronavirus 2 NEGATIVE NEGATIVE Final    Comment: (NOTE) SARS-CoV-2 target nucleic acids are NOT DETECTED. The SARS-CoV-2 RNA is generally detectable in upper and lower respiratory specimens during the acute phase of infection. Negative results do not preclude SARS-CoV-2 infection, do not rule out co-infections with other pathogens, and should not be used as the sole basis for treatment or other patient management decisions. Negative results must be combined with clinical observations, patient history, and epidemiological information. The expected result is Negative. Fact Sheet for Patients: SugarRoll.be Fact Sheet for Healthcare Providers: https://www.woods-mathews.com/ This test is not yet approved or cleared by the [place] FDA and  has been authorized for detection and/or diagnosis of SARS-CoV-2 by FDA under an Emergency Use Authorization (EUA). This EUA will remain  in effect (meaning this test can be used) for the duration of the COVID-19 declaration under Section 56 4(b)(1) of the Act, 21  U.S.C. section 360bbb-3(b)(1), unless the authorization is terminated or revoked sooner. Performed at [facility], [place] [address] [place], [place] [address]      Radiology Studies: DG Chest Portable 1 View  Result Date: 10/18/2019 CLINICAL DATA:  Hypertension and shortness of breath EXAM: PORTABLE CHEST 1 VIEW COMPARISON:  September 17, 2019 FINDINGS: Ill-defined opacity in the right mid and lower lung regions noted with suspected small superimposed pleural effusion. There is mild left base atelectasis. Left lung otherwise clear. Heart is mildly enlarged with pulmonary vascularity normal. No adenopathy. There is degenerative change in the thoracic spine. IMPRESSION: Ill-defined opacity right mid and lower lung zones, likely due to a degree of pneumonia with superimposed small pleural effusion. Mild left base  atelectasis. Stable cardiac prominence. No adenopathy evident. Electronically Signed   By: Lowella Grip III M.D.   On: 10/18/2019 14:06    Scheduled Meds: . amLODipine  10 mg Oral Daily  . carvedilol  12.5 mg Oral BID WC  . Chlorhexidine Gluconate Cloth  6 each Topical Daily  . ferrous sulfate  325 mg Oral BID WC  . hydrALAZINE  50 mg Oral Q8H  . megestrol  80 mg Oral BID  . pantoprazole sodium  40 mg Oral Daily  . polyethylene glycol  17 g Oral Daily  . sodium chloride flush  10-40 mL Intracatheter Q12H   Continuous Infusions: . ceFAZolin 2 g (10/20/19 1038)     LOS: 1 day   Time spent: 9min  Joy Reiger C Dezmond Downie, DO [facility]  If 7PM-7AM, please contact night-coverage [email or web address]  10/20/2019, 10:46 AM

## 2019-10-20 NOTE — Progress Notes (Signed)
PHARMACY NOTE:  ANTIMICROBIAL RENAL DOSAGE ADJUSTMENT  Current antimicrobial regimen includes a mismatch between antimicrobial dosage and estimated renal function.  As per policy approved by the Pharmacy & Therapeutics and Medical Executive Committees, the antimicrobial dosage will be adjusted accordingly.  Current antimicrobial dosage:  Cefazolin 2gm iv q8hr  Indication: MSSA bacteremia  Renal Function:  Estimated Creatinine Clearance: 26.3 mL/min (A) (by C-G formula based on SCr of 2.22 mg/dL (H)). []      On intermittent HD, scheduled: []      On CRRT    Antimicrobial dosage has been changed to:  Cefezolin 2gm iv q12hr  Additional comments:     Thank you for allowing pharmacy to be a part of this patient's care.  [place], [place], [facility] 10/20/2019 5:54 AM

## 2019-10-21 LAB — CBC
HCT: 25.9 % — ABNORMAL LOW (ref 36.0–46.0)
Hemoglobin: 7.7 g/dL — ABNORMAL LOW (ref 12.0–15.0)
MCH: 29.6 pg (ref 26.0–34.0)
MCHC: 29.7 g/dL — ABNORMAL LOW (ref 30.0–36.0)
MCV: 99.6 fL (ref 80.0–100.0)
Platelets: 234 10*3/uL (ref 150–400)
RBC: 2.6 MIL/uL — ABNORMAL LOW (ref 3.87–5.11)
RDW: 21.2 % — ABNORMAL HIGH (ref 11.5–15.5)
WBC: 7.8 10*3/uL (ref 4.0–10.5)
nRBC: 0 % (ref 0.0–0.2)

## 2019-10-21 LAB — COMPREHENSIVE METABOLIC PANEL
ALT: 5 U/L (ref 0–44)
AST: 14 U/L — ABNORMAL LOW (ref 15–41)
Albumin: 2.7 g/dL — ABNORMAL LOW (ref 3.5–5.0)
Alkaline Phosphatase: 71 U/L (ref 38–126)
Anion gap: 12 (ref 5–15)
BUN: 34 mg/dL — ABNORMAL HIGH (ref 8–23)
CO2: 25 mmol/L (ref 22–32)
Calcium: 8.1 mg/dL — ABNORMAL LOW (ref 8.9–10.3)
Chloride: 106 mmol/L (ref 98–111)
Creatinine, Ser: 2.24 mg/dL — ABNORMAL HIGH (ref 0.44–1.00)
GFR calc Af Amer: 26 mL/min — ABNORMAL LOW (ref >60)
GFR calc non Af Amer: 22 mL/min — ABNORMAL LOW (ref >60)
Glucose, Bld: 141 mg/dL — ABNORMAL HIGH (ref 70–99)
Potassium: 3.4 mmol/L — ABNORMAL LOW (ref 3.5–5.1)
Sodium: 143 mmol/L (ref 135–145)
Total Bilirubin: 0.6 mg/dL (ref 0.3–1.2)
Total Protein: 6.7 g/dL (ref 6.5–8.1)

## 2019-10-21 MED ORDER — CARVEDILOL 12.5 MG PO TABS: 12.5000 mg | ORAL_TABLET | Freq: Two times a day (BID) | ORAL | 0 refills | Status: DC

## 2019-10-21 MED ORDER — HEPARIN SOD (PORK) LOCK FLUSH 100 UNIT/ML IV SOLN
250.0000 [IU] | INTRAVENOUS | Status: AC | PRN
  Administered 2019-10-21: 16:00:00 250 [IU]
  Filled 2019-10-21: qty 2.5

## 2019-10-21 MED ORDER — FUROSEMIDE 40 MG PO TABS: 40.0000 mg | ORAL_TABLET | Freq: Every day | ORAL | 0 refills | Status: DC

## 2019-10-21 MED ORDER — CEFAZOLIN IV (FOR PTA / DISCHARGE USE ONLY): 2.0000 g | Freq: Two times a day (BID) | INTRAVENOUS | 0 refills | Status: AC

## 2019-10-21 NOTE — Progress Notes (Signed)
Rec'd order from MD to administer PRN hydralazine prior to d/c for BP. PICC already hep locked. Discussed w/ PICC RN. Started new bag NS w/ new line, pushed medication, flushed w/ pulse method, clamped line and removed tubing.

## 2019-10-21 NOTE — Evaluation (Signed)
Occupational Therapy Evaluation Patient Details Name: Vanessa [name] MRN: [id number] DOB: [birth year] Today's Date: 10/21/2019    History of Present Illness 64 y.o. female with medical history significant for hypertension, type 2 diabetes mellitus -without insulin evidence, CKD 3b, morbid obesity, hyperlipidemia, recent diagnosis endometrial cancer -   Clinical Impression   Pt admitted with the above. Pt currently with functional limitations due to the deficits listed below (see OT Problem List).  Pt will benefit from skilled OT to increase their safety and independence with ADL and functional mobility for ADL to facilitate discharge to venue listed below.      Follow Up Recommendations  Home health OT;Supervision/Assistance - 24 hour    Equipment Recommendations  None recommended by OT    Recommendations for Other Services       Precautions / Restrictions Precautions Precautions: Fall Shoulder Interventions: Shoulder sling/immobilizer;For comfort Precaution Comments: L proximal humerus fx healing (not acute)- per admission 09/20/19 pt can use arm functionally and WBAT, sling for comfort Restrictions LUE Weight Bearing: Weight bearing as tolerated Other Position/Activity Restrictions: L shoulder pain with xray finding proximal humerus fx healing (not acute)- per chat text with Dr. Cathlean Sauer (09/20/2019) pt can use arm functionally and WBAT, sling for comfort      Mobility Bed Mobility               General bed mobility comments: OOB in recliner upon entry   Transfers   Equipment used: Rolling walker (2 wheeled) Transfers: Sit to/from [employer] to Stand: Min guard Stand pivot transfers: Min guard       General transfer comment: min guard steady cuing for hand placement    Balance Overall balance assessment: Needs assistance Sitting-balance support: Feet supported Sitting balance-Leahy Scale: Good     Standing balance support: Single  extremity supported;During functional activity Standing balance-Leahy Scale: Fair Standing balance comment: Pt relient on single UE support during ADLs                           ADL either performed or assessed with clinical judgement   ADL Overall ADL's : Needs assistance/impaired     Grooming: Wash/dry face;Oral care;Set up;Sitting   Upper Body Bathing: Moderate assistance;Sitting   Lower Body Bathing: Sit to/from stand;Minimal assistance   Upper Body Dressing : Sitting;Minimal assistance   Lower Body Dressing: Sit to/from stand;Minimal assistance   Toilet Transfer: Ambulation;BSC;Min guard([facility] to [facility] )   Toileting- Clothing Manipulation and Hygiene: Minimal assistance;Sit to/from stand;Cueing for safety;Cueing for sequencing       Functional mobility during ADLs: Cueing for sequencing;Min guard General ADL Comments: plan is home with [facility] rather than back to SNF     Vision Patient Visual Report: No change from baseline       Perception     Praxis      Pertinent Vitals/Pain Pain Assessment: No/denies pain     Hand Dominance     Extremity/Trunk Assessment Upper Extremity Assessment Upper Extremity Assessment: Overall WFL for tasks assessed LUE Deficits / Details: WFL elbow, wrist, hand; pt declines shoulder movement; noted edema in hand LUE Sensation: WNL LUE Coordination: decreased fine motor;decreased gross motor   Lower Extremity Assessment Lower Extremity Assessment: Overall WFL for tasks assessed RLE Deficits / Details: edematous LLE Deficits / Details: edematous       Communication     Cognition Arousal/Alertness: Awake/alert Behavior During Therapy: WFL for tasks assessed/performed Overall Cognitive Status: No family/caregiver  present to determine baseline cognitive functioning                       Memory: Decreased short-term memory Following Commands: Follows one step commands consistently;Follows multi-step commands with  increased time                      Home Living Family/patient expects to be discharged to:: Unsure Living Arrangements: Spouse/significant other Available Help at Discharge: Family;Available PRN/intermittently Type of Home: Apartment Home Access: Level entry     Home Layout: One level               Home Equipment: None          Prior Functioning/Environment    Gait / Transfers Assistance Needed: Reports household ambulation; reports "furniture walking"; reports fell OOB when asleep but denies other falls              OT Problem List: Decreased strength;Decreased range of motion;Decreased activity tolerance;Impaired balance (sitting and/or standing);Decreased coordination;Decreased cognition;Decreased safety awareness;Decreased knowledge of use of DME or AE;Decreased knowledge of precautions;Obesity;Impaired UE functional use;Increased edema;Impaired sensation      OT Treatment/Interventions: Self-care/ADL training;Therapeutic exercise;DME and/or AE instruction;Therapeutic activities;Cognitive remediation/compensation;Patient/family education;Balance training    OT Goals(Current goals can be found in the care plan section) Acute Rehab OT Goals Patient Stated Goal: go home  OT Frequency: Min 2X/week   Barriers to D/C:               AM-PAC OT "6 Clicks" Daily Activity     Outcome Measure Help from another person eating meals?: A Little Help from another person taking care of personal grooming?: A Little Help from another person toileting, which includes using toliet, bedpan, or urinal?: A Little Help from another person bathing (including washing, rinsing, drying)?: A Little Help from another person to put on and taking off regular upper body clothing?: A Little Help from another person to put on and taking off regular lower body clothing?: A Little 6 Click Score: 18   End of Session Equipment Utilized During Treatment: (sling ) Nurse Communication:  Mobility status  Activity Tolerance: Patient tolerated treatment well Patient left: in chair;with call bell/phone within reach  OT Visit Diagnosis: Other abnormalities of gait and mobility (R26.89);Muscle weakness (generalized) (M62.81)                T  Charges:  OT General Charges $OT Visit: 1 Visit OT Evaluation $OT Eval Moderate Complexity: 1 Mod  Kari Baars, OT Acute Rehabilitation Services [phone] Office- [phone], Edwena Felty D 10/21/2019, 3:16 PM

## 2019-10-21 NOTE — TOC Transition Note (Signed)
Transition of Care [facility]) - CM/SW Discharge Note   Patient Details  Name: Vanessa [name] MRN: [id number] Date of Birth: Mar 27, 1956  [facility] CM/SW Contact:  Dessa Phi, RN Phone Number: 10/21/2019, 1:28 PM   Clinical Narrative:  From SNF-[facility]-After PT eval-recc HHPT/HHOT. Spoke to patient/spouse agree to home w/HHC. [facility]. [employer]-rol[facility] to deliver to rm prior d/c.Spouse says he can transport home.MD/patient/spouse agree w/d/c plan.No further CM needs.    Final next level of care: [place] Barriers to Discharge: No Barriers Identified   Patient Goals and CMS Choice Patient states their goals for this hospitalization and ongoing recovery are:: go home CMS Medicare.gov Compare Post Acute Care list provided to:: Patient Choice offered to / list presented to : Patient, Spouse  Discharge Placement                       Discharge Plan and Services   Discharge Planning Services: CM Consult Post Acute Care Choice: Home Health          DME Arranged: Walker rolling with seat DME Agency: [employer] Date DME Agency Contacted: 10/21/19 Time DME Agency Contacted: 1219 Representative spoke with at DME Agency: [place]: PT, OT [place] Agency: [place] Date [place]: 10/21/19 Time [place]: [place] Representative spoke with at [employer]: [place] ([place]) Interventions     Readmission Risk Interventions Readmission Risk Prevention Plan 10/21/2019 09/25/2019  Transportation Screening Complete Complete  PCP or Specialist Appt within 3-5 Days - Not Complete  Not Complete comments - plan for SNF  [place] or [place] - Complete  Social Work Consult for [place] Planning/Counseling - Complete  Palliative Care Screening - Not Applicable  Medication Review [employer]) Referral to Pharmacy Referral to Pharmacy  PCP or Specialist appointment within 3-5  days of discharge Complete -  [place] or [place] (No Data) -  SW Recovery Care/Counseling Consult Complete -  Palliative Care Screening Not Applicable -  Skilled Nursing Facility Complete -  Some recent data might be hidden

## 2019-10-21 NOTE — Evaluation (Signed)
Physical Therapy Evaluation Patient Details Name: Vanessa [name] MRN: [id number] DOB: [birth year] Today's Date: 10/21/2019   History of Present Illness  64 y.o. female with medical history significant for hypertension, type 2 diabetes mellitus -without insulin evidence, CKD 3b, morbid obesity, hyperlipidemia, recent diagnosis endometrial cancer -  Clinical Impression  Pt admitted with above diagnosis.  Pt doing well today, SPT and amb short distance in room with min/guard assist. amb in hallway 160' with RW, min/guard for safety. Recommend HHPT and rollator if possible.  Pt currently with functional limitations due to the deficits listed below (see PT Problem List). Pt will benefit from skilled PT to increase their independence and safety with mobility to allow discharge to the venue listed below.       Follow Up Recommendations Home health PT;Supervision - Intermittent    Equipment Recommendations  Other (comment)(rollator)    Recommendations for Other Services       Precautions / Restrictions Precautions Precautions: Fall Shoulder Interventions: Shoulder sling/immobilizer;For comfort Precaution Comments: L proximal humerus fx healing (not acute)- per admission 09/20/19 pt can use arm functionally and WBAT, sling for comfort Restrictions LUE Weight Bearing: Weight bearing as tolerated      Mobility  Bed Mobility               General bed mobility comments: OOB in recliner upon entry   Transfers   Equipment used: Rolling walker (2 wheeled) Transfers: Sit to/from Stand Sit to Stand: Min guard         General transfer comment: min guard steady cuing for hand placement  Ambulation/Gait Ambulation/Gait assistance: Min guard Gait Distance (Feet): 160 Feet   Gait Pattern/deviations: Step-through pattern;Decreased stride length;Wide base of support     General Gait Details: cues for posture, Rw position  Stairs            Wheelchair Mobility    Modified  [place] (Stroke Patients Only)       Balance Overall balance assessment: Needs assistance Sitting-balance support: Feet supported Sitting balance-Leahy Scale: Good     Standing balance support: Single extremity supported;During functional activity Standing balance-Leahy Scale: Fair Standing balance comment: Pt relient on single UE support during ADLs                             Pertinent Vitals/Pain Pain Assessment: No/denies pain    Home Living Family/patient expects to be discharged to:: Unsure Living Arrangements: Spouse/significant other Available Help at Discharge: Family;Available PRN/intermittently Type of Home: Apartment Home Access: Level entry     Home Layout: One level Home Equipment: None      Prior Function     Gait / Transfers Assistance Needed: Reports household ambulation; reports "furniture walking"; reports fell OOB when asleep but denies other falls           Hand Dominance        Extremity/Trunk Assessment   Upper Extremity Assessment Upper Extremity Assessment: Overall WFL for tasks assessed LUE Deficits / Details: WFL elbow, wrist, hand; pt declines shoulder movement; noted edema in hand    Lower Extremity Assessment Lower Extremity Assessment: Overall WFL for tasks assessed RLE Deficits / Details: edematous LLE Deficits / Details: edematous       Communication      Cognition Arousal/Alertness: Awake/alert Behavior During Therapy: WFL for tasks assessed/performed  Memory: Decreased short-term memory Following Commands: Follows one step commands consistently;Follows multi-step commands with increased time              General Comments      Exercises     Assessment/Plan    PT Assessment Patient needs continued PT services  PT Problem List Decreased strength;Decreased activity tolerance;Decreased mobility;Decreased knowledge of use of DME;Pain;Decreased balance;Decreased  range of motion       PT Treatment Interventions Gait training;Functional mobility training;Therapeutic activities;Balance training;Patient/family education;DME instruction;Therapeutic exercise    PT Goals (Current goals can be found in the Care Plan section)  Acute Rehab PT Goals Patient Stated Goal: go home PT Goal Formulation: With patient Time For Goal Achievement: 10/28/19 Potential to Achieve Goals: Good    Frequency Min 3X/week   Barriers to discharge        Co-evaluation               AM-PAC PT "6 Clicks" Mobility  Outcome Measure Help needed turning from your back to your side while in a flat bed without using bedrails?: A Little Help needed moving from lying on your back to sitting on the side of a flat bed without using bedrails?: A Little Help needed moving to and from a bed to a chair (including a wheelchair)?: A Little Help needed standing up from a chair using your arms (e.g., wheelchair or bedside chair)?: A Little Help needed to walk in hospital room?: A Little Help needed climbing 3-5 steps with a railing? : A Little 6 Click Score: 18    End of Session   Activity Tolerance: Patient tolerated treatment well Patient left: with call bell/phone within reach;in chair Nurse Communication: Mobility status PT Visit Diagnosis: Unsteadiness on feet (R26.81);Other abnormalities of gait and mobility (R26.89)    Time: 5110-2111 PT Time Calculation (min) (ACUTE ONLY): 16 min   Charges:   PT Evaluation $PT Eval Low Complexity: 1 Low          Kenzly Rogoff, PT   Acute Rehab Dept [facility]): [phone]   10/21/2019   [facility] 10/21/2019, 12:28 PM

## 2019-10-21 NOTE — TOC Progression Note (Signed)
Transition of Care [facility]) - Progression Note    Patient Details  Name: Vanessa [name] MRN: [id number] Date of Birth: Dec 02, 1955  Transition of [facility]) CM/SW Contact  Oswin Griffith, Juliann Pulse, RN Phone Number: 10/21/2019, 2:15 PM  Clinical Narrative: patient is now going to SNF d/t continuing to receive iv abx till 4/22-CM contacted the [facility]-rep sheila confirmed was receiving iv abx-they can accept. Will start auth.    Expected Discharge Plan: Skilled Nursing Facility Barriers to Discharge: Insurance Authorization  Expected Discharge Plan and Services Expected Discharge Plan: [place]   Discharge Planning Services: CM Consult Post Acute Care Choice: [place] Living arrangements for the past 2 months: [place] Expected Discharge Date: 10/21/19               DME Arranged: Gilford Rile rolling with seat DME Agency: [employer] Date DME Agency Contacted: 10/21/19 Time DME Agency Contacted: [id number] Representative spoke with at DME Agency: Thedore Mins HH Arranged: PT, OT [place] Agency: [place] Date [place]: 10/21/19 Time [place]: [place] Representative spoke with at [place]: [place] ([place]) Interventions    Readmission Risk Interventions Readmission Risk Prevention Plan 10/21/2019 09/25/2019  Transportation Screening Complete Complete  PCP or Specialist Appt within 3-5 Days - Not Complete  Not Complete comments - plan for SNF  [place] or [place] - Complete  Social Work Consult for [place] Planning/Counseling - Complete  Palliative Care Screening - Not Applicable  Medication Review Press photographer) Referral to Pharmacy Referral to Pharmacy  PCP or Specialist appointment within 3-5 days of discharge Complete -  [employer] or [place] (No Data) -  SW Recovery Care/Counseling Consult Complete -  Palliative Care Screening Not Applicable -  Skilled Nursing Facility  Complete -  Some recent data might be hidden

## 2019-10-21 NOTE — TOC Transition Note (Signed)
Transition of Care [facility]) - CM/SW Discharge Note   Patient Details  Name: Vanessa [name] MRN: [id number] Date of Birth: 13-May-1956  Transition of Care [facility]) CM/SW Contact:  Dessa Phi, RN Phone Number: 10/21/2019, 2:54 PM   Clinical Narrative: Clarification of d/c needs-iv abx till 4/22-rapid review for auth received-TC [facility] rep Sheila-await response if bed available for patient to return-d/c summary faxed-await rm bed.     Final next level of care: Skilled Nursing Facility Barriers to Discharge: No Barriers Identified   Patient Goals and CMS Choice Patient states their goals for this hospitalization and ongoing recovery are:: go to rehab CMS Medicare.gov Compare Post Acute Care list provided to:: Patient Choice offered to / list presented to : Patient, Spouse  Discharge Placement                       Discharge Plan and Services   Discharge Planning Services: CM Consult Post Acute Care Choice: [place]          DME Arranged: [place] rolling with seat DME Agency: [employer] Date DME Agency Contacted: 10/21/19 Time DME Agency Contacted: [id number] Representative spoke with at DME Agency: Thedore Mins HH Arranged: PT, OT [place] Agency: [place] Date [place]: 10/21/19 Time [place]: [place] Representative spoke with at [place]: [place] ([place]) Interventions     Readmission Risk Interventions Readmission Risk Prevention Plan 10/21/2019 09/25/2019  Transportation Screening Complete Complete  PCP or Specialist Appt within 3-5 Days - Not Complete  Not Complete comments - plan for SNF  [place] or [place] - Complete  Social Work Consult for [place] Planning/Counseling - Complete  Palliative Care Screening - Not Applicable  Medication Review Press photographer) Referral to Pharmacy Referral to Pharmacy  PCP or Specialist appointment within 3-5 days of discharge Complete -  [place] or [place] (No Data) -  SW Recovery Care/Counseling Consult Complete -  Palliative Care Screening Not Applicable -  Skilled Nursing Facility Complete -  Some recent data might be hidden

## 2019-10-21 NOTE — NC FL2 (Signed)
[place] LEVEL OF CARE SCREENING TOOL     IDENTIFICATION  Patient Name: Vanessa [name] Birthdate: [birth year] Sex: female Admission Date (Current Location): 10/18/2019  [facility] and [place] Number:  Herbalist and Address:  [facility],  [place], [place]      Provider Number: [id number]  Attending Physician Name and Address:  Little Ishikawa, MD  Relative Name and Phone Number:  Harrison Paulson [phone]    Current Level of Care: Hospital Recommended Level of Care: [place] Prior Approval Number:    Date Approved/Denied:   PASRR Number: [id number]  Discharge Plan: SNF    Current Diagnoses: Patient Active Problem List   Diagnosis Date Noted  . Allergic angioedema 10/19/2019  . Anasarca 10/18/2019  . Angioedema 10/18/2019  . Hypertensive emergency 10/18/2019  . Trichimoniasis 10/17/2019  . Anemia 10/05/2019  . Esophageal dysmotility 10/04/2019  . Pressure injury of skin 09/22/2019  . Endometrial adenocarcinoma ([place]) 09/21/2019  . Left humeral fracture 09/21/2019  . Iron deficiency anemia due to chronic blood loss 09/21/2019  . MSSA bacteremia 09/18/2019  . Postmenopausal bleeding 09/15/2019  . Thrombocytopenia ([place]) 09/15/2019  . Generalized pruritus 09/15/2019  . Benign hypertension with CKD (chronic kidney disease) stage III 09/15/2019  . Hyperuricemia 09/15/2019  . Hypokalemia 09/15/2019  . Symptomatic anemia 09/14/2019  . Non compliance w medication regimen 03/24/2015  . Dyslipidemia 03/24/2015  . DM type 2 (diabetes mellitus, type 2) ([place]) 05/23/2014  . HTN (hypertension) 05/23/2014    Orientation RESPIRATION BLADDER Height & Weight     Self, Time, Situation, Place  Normal Continent Weight: 100.3 kg Height:  4\' 11"  (149.9 cm)  BEHAVIORAL SYMPTOMS/MOOD NEUROLOGICAL BOWEL NUTRITION STATUS      Continent Diet  AMBULATORY STATUS COMMUNICATION OF NEEDS Skin   Limited Assist  Verbally Skin abrasions                       Personal Care Assistance Level of Assistance  Bathing, Feeding, Dressing   Feeding assistance: Limited assistance Dressing Assistance: Limited assistance     Functional Limitations Info  Sight, Hearing, Speech Sight Info: Impaired(eyeglasses) Hearing Info: Adequate Speech Info: Adequate    SPECIAL CARE FACTORS FREQUENCY  (RN-iv abx)                    Contractures Contractures Info: Not present    Additional Factors Info  Code Status Code Status Info: Full code             Current Medications (10/21/2019):  This is the current hospital active medication list Current Facility-Administered Medications  Medication Dose Route Frequency Provider Last Rate Last Admin  . acetaminophen (TYLENOL) tablet 500 mg  500 mg Oral Q6H PRN Little Ishikawa, MD   500 mg at 10/20/19 2225  . amLODipine (NORVASC) tablet 10 mg  10 mg Oral Daily Little Ishikawa, MD   10 mg at 10/21/19 0951  . benzocaine (ORAJEL) 10 % mucosal gel 1 application  1 application Mouth/Throat QID PRN Little Ishikawa, MD      . carbamide peroxide (DEBROX) 6.5 % OTIC (EAR) solution 5 drop  5 drop Both EARS BID PRN Little Ishikawa, MD   5 drop at 10/20/19 2227  . carvedilol (COREG) tablet 12.5 mg  12.5 mg Oral BID WC Little Ishikawa, MD   12.5 mg at 10/21/19 0951  . ceFAZolin (ANCEF) IVPB 2g/100 mL premix  2 g Intravenous Q12H Little Ishikawa, MD 200 mL/hr at 10/21/19 0957 2 g at 10/21/19 0957  . Chlorhexidine Gluconate Cloth 2 % PADS 6 each  6 each Topical Daily Little Ishikawa, MD   6 each at 10/21/19 [phone]  . ferrous sulfate tablet 325 mg  325 mg Oral BID WC Little Ishikawa, MD   325 mg at 10/21/19 0951  . furosemide (LASIX) injection 20 mg  20 mg Intravenous Q6H Little Ishikawa, MD   20 mg at 10/21/19 1342  . hydrALAZINE (APRESOLINE) injection 10 mg  10 mg Intravenous Q4H PRN Little Ishikawa, MD   10 mg at 10/20/19  1257  . hydrALAZINE (APRESOLINE) tablet 50 mg  50 mg Oral Q8H Little Ishikawa, MD   50 mg at 10/21/19 1343  . megestrol (MEGACE) tablet 80 mg  80 mg Oral BID Little Ishikawa, MD   80 mg at 10/21/19 0951  . oxyCODONE (Oxy IR/ROXICODONE) immediate release tablet 5 mg  5 mg Oral Q6H PRN Little Ishikawa, MD      . pantoprazole sodium (PROTONIX) 40 mg/20 mL oral suspension 40 mg  40 mg Oral Daily Little Ishikawa, MD   40 mg at 10/21/19 0959  . polyethylene glycol (MIRALAX / GLYCOLAX) packet 17 g  17 g Oral Daily Little Ishikawa, MD   17 g at 10/21/19 0951  . sodium chloride flush (NS) 0.9 % injection 10-40 mL  10-40 mL Intracatheter Q12H Little Ishikawa, MD   10 mL at 10/20/19 2226  . sodium chloride flush (NS) 0.9 % injection 10-40 mL  10-40 mL Intracatheter PRN Little Ishikawa, MD         Discharge Medications: Please see discharge summary for a list of discharge medications.  Relevant Imaging Results:  Relevant Lab Results:   Additional Information SS# [id number]  Dessa Phi, RN

## 2019-10-21 NOTE — Discharge Summary (Addendum)
Physician Discharge Summary  Vanessa [name] VXY:[id number] DOB: March 29, 1956 DOA: 10/18/2019  PCP: Vanessa Reamer, MD (Inactive)  Admit date: 10/18/2019 Discharge date: 10/21/2019  Admitted From: SNF Disposition: SNF  Recommendations for Outpatient Follow-up:  1. Follow up with PCP in 1-2 weeks 2. Please obtain BMP/CBC in one week  Discharge Condition: Stable CODE STATUS: Full Diet recommendation: As tolerated, cardiac salt restricted fluid restricted  Brief/Interim Summary: Vanessa Heyward [name] a 64 y.o.femalewith medical history significant for hypertension, type 2 diabetes mellitus-without insulin evidence,CKD 3b,morbid obesity, hyperlipidemia,recent diagnosis endometrial cancer - being followed by Vanessa [name] gyn-oncwas just seen on 10/17/2019 for follow-up who initially recommended surgical staging hysterectomy bilateral salpingo-oophorectomy and lymph node assessment but given her high risk she appears not to be a surgical candidate at this time. He was just treated in our facility forMSSA bacteremia evaluated for symptomatic anemia in setting of GI bleed with notable duodenal ulcer.Patient was recently evaluated at the office for further evaluation and treatment of endometrial cancer as above, initiated on metronidazole for recent trichomonas noted on Pap smear. Patient had 1 dose of this medication yesterday and subsequently noted swelling in the face hands and feet over the past 12 to 24 hours. Patient subsequently presented to the ED for further evaluation and work-up given new onset swelling and edema. Upon arrival to the ED, patient was found to have diffuse anasarca/edemaover the face upper and lower extremities with profound hypertension but without airway compromise or hypoxia, systolic blood pressurewellabove 200 with a MAP of 130without tachycardia fever or leukocytosis. Patient's labs were remarkable for anemia at 7.9, creatinine 2.19 and mild hypernatremia at 146. Given  patient's likely hypertensive emergency in the setting of new edema and profound hypertension hospitalist was called for further evaluation and admission.  Patient met as above with acute onset edema of the face and extremities concerning for allergic reaction to new medication of metronidazole.  Patient also had remarkably elevated blood pressure and echocardiogram at intake indicates patient does have grade 2 diastolic dysfunction and heart failure both to be the cause for her lower extremity edema would not explain her bilateral hand and facial edema.  Patient's home labetalol has been replaced with carvedilol in hopes to improve patient's blood pressure more appropriately in addition to her home hydralazine, amlodipine and new Lasix. At this point patient diuresing quite well, transition to p.o. Lasix, no respiratory distress hypoxia otherwise following with PT OT for ultimate disposition back to skilled nursing facility as she continues to require IV antibiotics for MSSA bacteremia from previous hospitalization.  Patient has been continued on her previous antibiotic regiment including cefepime but we did discuss with pharmacy the need to decrease dose to every 12 hours given creatinine clearance.  This likely need to be monitored closely and titrated on repeat labs at skilled nursing facility.  Discharge Diagnoses:  Principal Problem:   Hypertensive emergency Active Problems:   Angioedema   DM type 2 (diabetes mellitus, type 2) (HCC)   Non compliance w medication regimen   Dyslipidemia   Benign hypertension with CKD (chronic kidney disease) stage III   MSSA bacteremia   Endometrial adenocarcinoma (HCC)   Iron deficiency anemia due to chronic blood loss   Pressure injury of skin   Esophageal dysmotility   Anemia   Anasarca   Allergic angioedema    Discharge Instructions  Discharge Instructions    Call MD for:  difficulty breathing, headache or visual disturbances   Complete by: As  directed  Call MD for:  extreme fatigue   Complete by: As directed    Call MD for:  hives   Complete by: As directed    Call MD for:  persistant dizziness or light-headedness   Complete by: As directed    Call MD for:  persistant nausea and vomiting   Complete by: As directed    Call MD for:  severe uncontrolled pain   Complete by: As directed    Call MD for:  temperature >100.4   Complete by: As directed    Diet - low sodium heart healthy   Complete by: As directed    Increase activity slowly   Complete by: As directed      Allergies as of 10/21/2019      Reactions   Metronidazole Swelling   Admitted to hospital April 2021 with angioedema.  Temporally- this medication was presumed cause.      Medication List    STOP taking these medications   cloNIDine 0.2 MG tablet Commonly known as: CATAPRES   labetalol 200 MG tablet Commonly known as: NORMODYNE     TAKE these medications   acetaminophen 500 MG tablet Commonly known as: TYLENOL Take 500 mg by mouth every 6 (six) hours as needed for moderate pain.   amLODipine 10 MG tablet Commonly known as: NORVASC Take 1 tablet (10 mg total) by mouth daily.   Bayer Contour Monitor w/Device Kit Use as directed for 3 times daily testing of blood glucose. E11.9   benzocaine 10 % mucosal gel Commonly known as: ORAJEL Use as directed 1 application in the mouth or throat 4 (four) times daily as needed for mouth pain.   carvedilol 12.5 MG tablet Commonly known as: COREG Take 1 tablet (12.5 mg total) by mouth 2 (two) times daily with a meal.   ceFAZolin  IVPB Commonly known as: ANCEF Inject 2 g into the vein every 12 (twelve) hours for 10 days. Indication:  MSSA bacteremia Last Day of Therapy:  10/31/19 Labs - Once weekly:  CBC/D and BMP, Labs - Every other week:  ESR and CRP What changed: when to take this   Chlorhexidine Gluconate Cloth 2 % Pads Apply 6 each topically daily.   diphenhydrAMINE 25 MG tablet Commonly  known as: BENADRYL Take 25 mg by mouth once.   ferrous sulfate 325 (65 FE) MG tablet Take 1 tablet (325 mg total) by mouth 2 (two) times daily with a meal.   furosemide 40 MG tablet Commonly known as: Lasix Take 1 tablet (40 mg total) by mouth daily.   glucose blood test strip Commonly known as: Estate manager/land agent Use as instructed for 3 times daily testing of blood glucose. E11.9   hydrALAZINE 50 MG tablet Commonly known as: APRESOLINE Take 1 tablet (50 mg total) by mouth every 8 (eight) hours.   INSULIN SYRINGE 1CC/29G 29G X 1/2" 1 ML Misc Insulin syringes   loratadine 10 MG tablet Commonly known as: CLARITIN Take 10 mg by mouth daily as needed for allergies.   megestrol 40 MG tablet Commonly known as: MEGACE Take 2 tablets (80 mg total) by mouth 2 (two) times daily.   metroNIDAZOLE 500 MG tablet Commonly known as: FLAGYL Take 1 tablet (500 mg total) by mouth 2 (two) times daily for 7 days.   onetouch ultrasoft lancets Use as instructed   oxyCODONE 5 MG immediate release tablet Commonly known as: Oxy IR/ROXICODONE Take 1 tablet (5 mg total) by mouth every 6 (six) hours as needed  for moderate pain.   pantoprazole sodium 40 mg/20 mL Pack Commonly known as: PROTONIX Take 20 mLs (40 mg total) by mouth daily.   polyethylene glycol 17 g packet Commonly known as: MIRALAX / GLYCOLAX Take 17 g by mouth daily.       Allergies  Allergen Reactions  . Metronidazole Swelling    Admitted to hospital April 2021 with angioedema.  Temporally- this medication was presumed cause.    Procedures/Studies: DG Orthopantogram  Result Date: 09/22/2019 CLINICAL DATA:  Tooth pain. EXAM: ORTHOPANTOGRAM/PANORAMIC COMPARISON:  None. FINDINGS: Multiple missing teeth. Periapical lucency adjacent to the root of the remaining left mandibular lateral incisor. Periapical lucency adjacent to the left maxillary incisor and left maxillary second molar. Significant resorption of the left  maxillary first molar and a portion of a residual last right maxillary molar. No bone lesions. IMPRESSION: 1. Significant dental disease with areas of periapical lucency as detailed. Electronically Signed   By: Lajean Manes M.D.   On: 09/22/2019 10:52   DG Chest Portable 1 View  Result Date: 10/18/2019 CLINICAL DATA:  Hypertension and shortness of breath EXAM: PORTABLE CHEST 1 VIEW COMPARISON:  September 17, 2019 FINDINGS: Ill-defined opacity in the right mid and lower lung regions noted with suspected small superimposed pleural effusion. There is mild left base atelectasis. Left lung otherwise clear. Heart is mildly enlarged with pulmonary vascularity normal. No adenopathy. There is degenerative change in the thoracic spine. IMPRESSION: Ill-defined opacity right mid and lower lung zones, likely due to a degree of pneumonia with superimposed small pleural effusion. Mild left base atelectasis. Stable cardiac prominence. No adenopathy evident. Electronically Signed   By: Lowella Grip III M.D.   On: 10/18/2019 14:06   [place] EKG SITE RITE  Result Date: 10/05/2019 If Site Rite image not attached, placement could not be confirmed due to current cardiac rhythm.  [place] EKG SITE RITE  Result Date: 09/22/2019 If Site Rite image not attached, placement could not be confirmed due to current cardiac rhythm.  DG ESOPHAGUS W SINGLE CM (SOL OR THIN BA)  Result Date: 09/24/2019 CLINICAL DATA:  64 year old female with history of unsuccessful TEE yesterday. Nausea and vomiting intermittently. EXAM: ESOPHOGRAM/BARIUM SWALLOW TECHNIQUE: Single contrast examination was performed using thin barium or water soluble. FLUOROSCOPY TIME:  Fluoroscopy Time:  48 seconds Radiation Exposure Index (if provided by the fluoroscopic device): 3.8 mGy Number of Acquired Spot Images: 0 COMPARISON:  None. FINDINGS: Limited single contrast study was performed due to lack of patient mobility. Within the limitations of today's examination, there  was no evidence of esophageal mass, esophageal ring or hiatal hernia. The distal esophagus shortly before the gastroesophageal junction never fully distended during the examination. Failure to normally propagate primary peristaltic waves was noted, with occasional tertiary contractions. A barium tablet was not administered as the patient cannot tolerate swallowing pills. IMPRESSION: 1. 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 2. Failure to fully distend the distal esophagus shortly before the gastroesophageal junction. The possibility of a distal esophageal stricture is not entirely excluded. Unfortunately, the patient could not tolerate ingestion of a barium tablet to better evaluate this finding. If there is clinical concern for distal esophageal stricture, further evaluation with nonemergent endoscopy should be considered. Electronically Signed   By: Vinnie Langton M.D.   On: 09/24/2019 09:24    Subjective: No acute issues or events overnight, swelling continues to improve per the patient, ambulating much more easily than previous, face appears to be back to baseline and  hands are much less swollen but not yet back to baseline otherwise   Discharge Exam: Vitals:   10/20/19 2117 10/21/19 0544  BP: (!) 164/89 (!) 195/88  Pulse: 86 79  Resp: 18 18  Temp: 98 F (36.7 C) 98 F (36.7 C)  SpO2: 95% 94%   Vitals:   10/20/19 1253 10/20/19 1343 10/20/19 2117 10/21/19 0544  BP: (!) 195/91 (!) 166/67 (!) 164/89 (!) 195/88  Pulse: 83 79 86 79  Resp: 19  18 18   Temp:  97.9 F (36.6 C) 98 F (36.7 C) 98 F (36.7 C)  TempSrc:  Axillary Oral Oral  SpO2: 100%  95% 94%  Weight:      Height:        General:  Pleasantly resting in bed, No acute distress. HEENT:  Normocephalic atraumatic.  Sclerae nonicteric, noninjected.  Extraocular movements intact bilaterally. Neck:  Without mass or deformity.  Trachea is midline. Lungs:  Clear to auscultate bilaterally  without rhonchi, wheeze, or rales. Heart:  Regular rate and rhythm.  Without murmurs, rubs, or gallops. Abdomen:  Soft, nontender, nondistended.  Without guarding or rebound. Extremities: Without cyanosis, left hand 1+ pitting edema, bilateral lower extremities 2+ pitting edema  Skin: Multiple excoriations and skin tears over bilateral lower extremities, chronic   The results of significant diagnostics from this hospitalization (including imaging, microbiology, ancillary and laboratory) are listed below for reference.     Microbiology: Recent Results (from the past 240 hour(s))  SARS CORONAVIRUS 2 (TAT 6-24 HRS) Nasopharyngeal Nasopharyngeal Swab     Status: None   Collection Time: 10/18/19  3:07 PM   Specimen: Nasopharyngeal Swab  Result Value Ref Range Status   SARS Coronavirus 2 NEGATIVE NEGATIVE Final    Comment: (NOTE) SARS-CoV-2 target nucleic acids are NOT DETECTED. The SARS-CoV-2 RNA is generally detectable in upper and lower respiratory specimens during the acute phase of infection. Negative results do not preclude SARS-CoV-2 infection, do not rule out co-infections with other pathogens, and should not be used as the sole basis for treatment or other patient management decisions. Negative results must be combined with clinical observations, patient history, and epidemiological information. The expected result is Negative. Fact Sheet for Patients: SugarRoll.be Fact Sheet for Healthcare Providers: https://www.woods-mathews.com/ This test is not yet approved or cleared by the [place] FDA and  has been authorized for detection and/or diagnosis of SARS-CoV-2 by FDA under an Emergency Use Authorization (EUA). This EUA will remain  in effect (meaning this test can be used) for the duration of the COVID-19 declaration under Section 56 4(b)(1) of the Act, 21 U.S.C. section 360bbb-3(b)(1), unless the authorization is terminated  or revoked sooner. Performed at [facility], [place] [address] [place], [place] [address]      Labs: BNP (last 3 results) Recent Labs    10/18/19 1241  BNP 194.1*   Basic Metabolic Panel: Recent Labs  Lab 10/18/19 1241 10/19/19 0354 10/20/19 0430 10/21/19 0400  NA 146* 145 143 143  K 4.0 4.1 3.6 3.4*  CL 114* 112* 110 106  CO2 22 21* 23 25  GLUCOSE 138* 183* 140* 141*  BUN 28* 31* 33* 34*  CREATININE 2.19* 2.21* 2.22* 2.24*  CALCIUM 8.6* 8.9 8.4* 8.1*   Liver Function Tests: Recent Labs  Lab 10/18/19 1241 10/19/19 0354 10/20/19 0430 10/21/19 0400  AST 12* 14* 11* 14*  ALT 5 <5 <5 5  ALKPHOS 79 69 66 71  BILITOT 0.7 0.4 0.4 0.6  PROT 7.1  7.6 6.9 6.7  ALBUMIN 2.7* 3.0* 2.7* 2.7*   No results for input(s): LIPASE, AMYLASE in the last 168 hours. No results for input(s): AMMONIA in the last 168 hours. CBC: Recent Labs  Lab 10/18/19 1241 10/19/19 0354 10/20/19 0430 10/20/19 1017 10/21/19 0400  WBC 7.1 7.8 7.4  --  7.8  HGB 7.9* 8.0* 7.3* 7.5* 7.7*  HCT 27.0* 26.5* 24.7* 25.5* 25.9*  MCV 98.9 98.1 100.0  --  99.6  PLT 242 281 240  --  234   Cardiac Enzymes: No results for input(s): CKTOTAL, CKMB, CKMBINDEX, TROPONINI in the last 168 hours. BNP: Invalid input(s): POCBNP CBG: Recent Labs  Lab 10/18/19 1217  GLUCAP 144*   D-Dimer No results for input(s): DDIMER in the last 72 hours. Hgb A1c No results for input(s): HGBA1C in the last 72 hours. Lipid Profile No results for input(s): CHOL, HDL, LDLCALC, TRIG, CHOLHDL, LDLDIRECT in the last 72 hours. Thyroid function studies No results for input(s): TSH, T4TOTAL, T3FREE, THYROIDAB in the last 72 hours.  Invalid input(s): FREET3 Anemia work up No results for input(s): VITAMINB12, FOLATE, FERRITIN, TIBC, IRON, RETICCTPCT in the last 72 hours. Urinalysis    Component Value Date/Time   COLORURINE YELLOW 10/05/2019 1140   APPEARANCEUR HAZY (A) 10/05/2019 1140   LABSPEC 1.016 10/05/2019 1140    PHURINE 5.0 10/05/2019 1140   GLUCOSEU NEGATIVE 10/05/2019 1140   HGBUR SMALL (A) 10/05/2019 1140   BILIRUBINUR NEGATIVE 10/05/2019 1140   KETONESUR NEGATIVE 10/05/2019 1140   PROTEINUR 100 (A) 10/05/2019 1140   UROBILINOGEN 0.2 03/06/2014 1644   NITRITE NEGATIVE 10/05/2019 1140   LEUKOCYTESUR NEGATIVE 10/05/2019 1140   Sepsis Labs Invalid input(s): PROCALCITONIN,  WBC,  LACTICIDVEN Microbiology Recent Results (from the past 240 hour(s))  SARS CORONAVIRUS 2 (TAT 6-24 HRS) Nasopharyngeal Nasopharyngeal Swab     Status: None   Collection Time: 10/18/19  3:07 PM   Specimen: Nasopharyngeal Swab  Result Value Ref Range Status   SARS Coronavirus 2 NEGATIVE NEGATIVE Final    Comment: (NOTE) SARS-CoV-2 target nucleic acids are NOT DETECTED. The SARS-CoV-2 RNA is generally detectable in upper and lower respiratory specimens during the acute phase of infection. Negative results do not preclude SARS-CoV-2 infection, do not rule out co-infections with other pathogens, and should not be used as the sole basis for treatment or other patient management decisions. Negative results must be combined with clinical observations, patient history, and epidemiological information. The expected result is Negative. Fact Sheet for Patients: SugarRoll.be Fact Sheet for Healthcare Providers: https://www.woods-mathews.com/ This test is not yet approved or cleared by the [place] FDA and  has been authorized for detection and/or diagnosis of SARS-CoV-2 by FDA under an Emergency Use Authorization (EUA). This EUA will remain  in effect (meaning this test can be used) for the duration of the COVID-19 declaration under Section 56 4(b)(1) of the Act, 21 U.S.C. section 360bbb-3(b)(1), unless the authorization is terminated or revoked sooner. Performed at [facility], [place] [address] [place], [place] [address]      Time coordinating discharge: Over 30  minutes  SIGNED:   Little Ishikawa, DO [facility] 10/21/2019, 11:43 AM Pager   If 7PM-7AM, please contact night-coverage [email or web address]

## 2019-10-21 NOTE — TOC Initial Note (Signed)
Transition of Care [facility]) - Initial/Assessment Note    Patient Details  Name: Vanessa [name] MRN: [id number] Date of Birth: 10-10-1955  Transition of [facility]) CM/SW Contact:    Dessa Phi, RN Phone Number: 10/21/2019, 10:31 AM  Clinical Narrative:   Noted d/c order. From a ST SNF-[facility]-prior to d/c will need imminent PT/OT eval to assess level of care prior getting auth from insurance, & fl2 to send to SNF.Spouse in agreement to SNF or home if appropriate.COVID 4/9-facility will allow if no fevers current.MD notified.                Expected Discharge Plan: Skilled Nursing Facility Barriers to Discharge: Insurance Authorization, Other (comment)   Patient Goals and CMS Choice Patient states their goals for this hospitalization and ongoing recovery are:: rehab      Expected Discharge Plan and Services Expected Discharge Plan: [place] Choice: [place] Living arrangements for the past 2 months: [place] Expected Discharge Date: 10/21/19                                    Prior Living Arrangements/Services Living arrangements for the past 2 months: [place] Lives with:: Facility Resident Patient language and need for interpreter reviewed:: Yes Do you feel safe going back to the place where you live?: Yes      Need for Family Participation in Patient Care: No (Comment) Care giver support system in place?: Yes (comment)   Criminal Activity/Legal Involvement Pertinent to Current Situation/Hospitalization: No - Comment as needed  Activities of Daily Living Home Assistive Devices/Equipment: Walker (specify type), Bedside commode/3-in-1, Cane (specify quad or straight)([facility] has necessary equipment for their residents.  At home, patient has front wheeled walker, single point cane and BSC) ADL Screening (condition at time of admission) Patient's cognitive ability adequate to  safely complete daily activities?: Yes Is the patient deaf or have difficulty hearing?: No Does the patient have difficulty seeing, even when wearing glasses/contacts?: No Does the patient have difficulty concentrating, remembering, or making decisions?: No Patient able to express need for assistance with ADLs?: Yes Does the patient have difficulty dressing or bathing?: Yes Independently performs ADLs?: No Communication: Independent Dressing (OT): Needs assistance Is this a change from baseline?: Pre-admission baseline Grooming: Needs assistance Is this a change from baseline?: Pre-admission baseline Feeding: Needs assistance Is this a change from baseline?: Pre-admission baseline Bathing: Needs assistance Is this a change from baseline?: Pre-admission baseline Toileting: Needs assistance Is this a change from baseline?: Pre-admission baseline In/Out Bed: Needs assistance Is this a change from baseline?: Pre-admission baseline Walks in Home: Needs assistance Is this a change from baseline?: Pre-admission baseline Does the patient have difficulty walking or climbing stairs?: Yes(secondary to weakness and shortness of breath) Weakness of Legs: Both Weakness of Arms/Hands: None  Permission Sought/Granted Permission sought to share information with : Case Manager Permission granted to share information with : Yes, Verbal Permission Granted  Share Information with NAME: Case Manager  Permission granted to share info w AGENCY: [place] granted to share info w Relationship: [place] spouse [phone]     Emotional Assessment Appearance:: Appears stated age Attitude/Demeanor/Rapport: Gracious Affect (typically observed): Accepting Orientation: : Oriented to Self, Oriented to Situation, Oriented to Place, Oriented to  Time Alcohol / Substance Use: Not Applicable Psych Involvement: No (  comment)  Admission diagnosis:  Anasarca [R60.1] AKI (acute kidney injury) ([employer])  [N17.9] Hypertensive emergency [I16.1] Allergic angioedema [T78.3XXA] Patient Active Problem List   Diagnosis Date Noted  . Allergic angioedema 10/19/2019  . Anasarca 10/18/2019  . Angioedema 10/18/2019  . Hypertensive emergency 10/18/2019  . Trichimoniasis 10/17/2019  . Anemia 10/05/2019  . Esophageal dysmotility 10/04/2019  . Pressure injury of skin 09/22/2019  . Endometrial adenocarcinoma ([place]) 09/21/2019  . Left humeral fracture 09/21/2019  . Iron deficiency anemia due to chronic blood loss 09/21/2019  . MSSA bacteremia 09/18/2019  . Postmenopausal bleeding 09/15/2019  . Thrombocytopenia ([place]) 09/15/2019  . Generalized pruritus 09/15/2019  . Benign hypertension with CKD (chronic kidney disease) stage III 09/15/2019  . Hyperuricemia 09/15/2019  . Hypokalemia 09/15/2019  . Symptomatic anemia 09/14/2019  . Non compliance w medication regimen 03/24/2015  . Dyslipidemia 03/24/2015  . DM type 2 (diabetes mellitus, type 2) ([place]) 05/23/2014  . HTN (hypertension) 05/23/2014   PCP:  Maren Reamer, MD (Inactive) Pharmacy:   [facility] [address] [place]), [place] - [address] [place] ([place])  [address] Phone: [phone] Fax: [place], [place] [address] [place] [address] Phone: [phone] Fax: [phone]  [id number] - [place], [place]. [place] [address] Phone: [phone] Fax: [phone]     Social Determinants of Health (SDOH) Interventions    Readmission Risk Interventions Readmission Risk Prevention Plan 10/21/2019 09/25/2019  Transportation Screening Complete Complete  PCP or Specialist Appt within 3-5 Days - Not Complete  Not Complete comments - plan for SNF  [place]RI or [place] - Complete  Social Work Consult for Dent Planning/Counseling - Complete  Palliative Care Screening - Not  Applicable  Medication Review (RN Care Manager) Referral to Pharmacy Referral to Pharmacy  PCP or Specialist appointment within 3-5 days of discharge Complete -  [place] or [place] (No Data) -  SW Recovery Care/Counseling Consult Complete -  Palliative Care Screening Not Applicable -  Skilled Nursing Facility Complete -  Some recent data might be hidden

## 2019-10-21 NOTE — TOC Transition Note (Signed)
Transition of Care [facility]) - CM/SW Discharge Note   Patient Details  Name: Vanessa [name] MRN: [id number] Date of Birth: Sep 10, 1955  Transition of Care [facility]) CM/SW Contact:  Dessa Phi, RN Phone Number: 10/21/2019, 3:15 PM   Clinical Narrative: [place] of patient return ging to [address], tel# for nsg to call report [phone]. PTAR called. No further CM needs.      Final next level of care: Skilled Nursing Facility Barriers to Discharge: No Barriers Identified   Patient Goals and CMS Choice Patient states their goals for this hospitalization and ongoing recovery are:: go to rehab CMS Medicare.gov Compare Post Acute Care list provided to:: Patient Choice offered to / list presented to : Patient, Spouse  Discharge Placement                       Discharge Plan and Services   Discharge Planning Services: CM Consult Post Acute Care Choice: [place]          DME Arranged: Walker rolling with seat DME Agency: [employer] Date DME Agency Contacted: 10/21/19 Time DME Agency Contacted: [phone] Representative spoke with at DME Agency: Thedore Mins HH Arranged: PT, OT [place] Agency: [place] Date [place]: 10/21/19 Time [place]: [place] Representative spoke with at [place]: [place] ([place]) Interventions     Readmission Risk Interventions Readmission Risk Prevention Plan 10/21/2019 09/25/2019  Transportation Screening Complete Complete  PCP or Specialist Appt within 3-5 Days - Not Complete  Not Complete comments - plan for SNF  [place] or [place] - Complete  Social Work Consult for [place] Planning/Counseling - Complete  Palliative Care Screening - Not Applicable  Medication Review Press photographer) Referral to Pharmacy Referral to Pharmacy  PCP or Specialist appointment within 3-5 days of discharge Complete -  [place] or [place] (No Data) -  SW Recovery  Care/Counseling Consult Complete -  Palliative Care Screening Not Applicable -  Skilled Nursing Facility Complete -  Some recent data might be hidden

## 2019-10-22 ENCOUNTER — Telehealth: Payer: Self-pay | Admitting: Oncology

## 2019-10-22 ENCOUNTER — Encounter: Payer: Self-pay | Admitting: Gynecologic Oncology

## 2019-10-22 NOTE — Progress Notes (Signed)
Attempted to reach patient to introduce myself as Arboriculturist and to offer available resources such as the one-time $1000 [employer].  Patient's voicemail is full. Will follow up later this week.

## 2019-10-22 NOTE — Telephone Encounter (Signed)
Called [place] and gave her phone number for Stefanie Libel, Museum/gallery curator Counselor to set up the [employer].

## 2019-10-24 ENCOUNTER — Telehealth: Payer: Self-pay

## 2019-10-24 ENCOUNTER — Encounter: Payer: Self-pay | Admitting: Gynecologic Oncology

## 2019-10-24 NOTE — Progress Notes (Signed)
Called patient at [facility] to introduce myself as Arboriculturist and discuss one-time [employer] to assist with personal expenses such as IUD while going through treatment.   Signed grant approval on her behalf based on treatment need. Notified care team and social worker.  Patient will receive a copy of the approval letter and expense sheet at her next visit.  She has my contact name and number for any additional financial questions or concerns.

## 2019-10-24 NOTE — Telephone Encounter (Signed)
Ms Graw's nurse Thurmond Butts stated that Ms Shevlin has not been treated for the trichomonas. Pt went to ED 10-18-19 with dx of angioedema thought to be caused by flagyl. Thurmond Butts states that Ms Spadaccini never received the medication. Pt with hypertensive crisis. Reviewed with Dr. Berline Lopes.  Pt needs to be treated with the flagyl prior to the IUD placement 11-05-19. Thurmond Butts stated that they still have the flagyl for the patient. She will begin the flagyl today.  Requested that [facility] notify Dr. Charisse March office if patient has a reaction to the medication.  Told Ryan that a financial advocate with the cancer center named Stefanie Libel will call the facility to get the cordless phone to Ms Jobst to discuss paper work for [employer] assistance from [employer] to help ay for IUD. Her cost with her inurance is $400.00. Ryan verbalized understanding.

## 2019-10-25 ENCOUNTER — Other Ambulatory Visit: Payer: Self-pay | Admitting: Gynecologic Oncology

## 2019-10-25 DIAGNOSIS — C541 Malignant neoplasm of endometrium: Secondary | ICD-10-CM

## 2019-10-25 MED ORDER — LEVONORGESTREL 20 MCG/24HR IU IUD: 1.0000 | INTRAUTERINE_SYSTEM | Freq: Once | INTRAUTERINE | 0 refills | Status: DC

## 2019-10-25 MED FILL — MIRENA SYSTEM: 20 | 84 days supply | Qty: 1 | Fill #0

## 2019-10-25 NOTE — Progress Notes (Signed)
Mirena IUD sent to [place] to be placed on November 05, 2019 in the office with Dr. Berline Lopes.

## 2019-10-28 ENCOUNTER — Inpatient Hospital Stay: Payer: Medicare Other | Admitting: Internal Medicine

## 2019-11-04 NOTE — Progress Notes (Deleted)
Gynecologic Oncology Return Clinic Visit  4/27  Reason for Visit: IUD insertion  Treatment History: The patient was recently admitted (discharged on 3/17) with symptomatic anemia in the setting of PMB and was treated for MSSA bacteremia. She was started on IV cefazolin with a plan for 6 weeks of IV antibiotic therapy (EOT date 4/22). She received 3u of pRBCs, was started on Megace (improved bleeding), and EMB was performed revealing endometrial cancer, FIGO grade II, endometrioid type.  Most recently, the patient was admitted from 3/26-4/1 from her SNF secondary to anemia. She received an additional 2u pRBCs. GI was consulted and the patient underwent EGD on 3/30 with one small non-bleeding cratered duodenal ulcer noted. Biopsies negative for H Pylori. She was discharged on a PPI back to her SNF.  Interval History: ***  Past Medical/Surgical History: Past Medical History:  Diagnosis Date  . Anemia 10/09/2019  . Asthma   . CKD (chronic kidney disease) stage 3, GFR 30-59 ml/min   . Dental disease   . Diabetes mellitus without complication ([place])   . Endometrial cancer ([place])   . Esophageal dysmotility   . Heart murmur   . HLD (hyperlipidemia)   . Hypertension   . MSSA bacteremia 09/2019  . Obesity, Class III, BMI 40-49.9 (morbid obesity) ([place])   . Pressure ulcer     Past Surgical History:  Procedure Laterality Date  . BIOPSY  10/08/2019   Procedure: BIOPSY;  Surgeon: Clarene Essex, MD;  Location: [facility] ENDOSCOPY;  Service: Endoscopy;;  . ESOPHAGOGASTRODUODENOSCOPY N/A 10/08/2019   Procedure: ESOPHAGOGASTRODUODENOSCOPY (EGD);  Surgeon: Clarene Essex, MD;  Location: [place];  Service: Endoscopy;  Laterality: N/A;  . TYMPANOSTOMY TUBE PLACEMENT Bilateral     Family History  Problem Relation Age of Onset  . Stroke Mother   . Diabetes Mother   . Hypertension Mother   . Cancer Mother        possible ovarian  . Diabetes Brother   . Hypertension Brother   . Cancer Maternal  Grandmother        possible ovarian    Social History   Socioeconomic History  . Marital status: Married    Spouse name: Not on file  . Number of children: Not on file  . Years of education: Not on file  . Highest education level: Not on file  Occupational History  . Not on file  Tobacco Use  . Smoking status: Former Research scientist (life sciences)  . Smokeless tobacco: Never Used  . Tobacco comment: " years ago ", >3yr  Substance and Sexual Activity  . Alcohol use: Yes    Comment: occasional  . Drug use: No  . Sexual activity: Not Currently  Other Topics Concern  . Not on file  Social History Narrative  . Not on file   Social Determinants of Health   Financial Resource Strain:   . Difficulty of Paying Living Expenses:   Food Insecurity:   . Worried About RCharity fundraiserin the Last Year:   . RArboriculturistin the Last Year:   Transportation Needs:   . LFilm/video editor(Medical):   .Marland KitchenLack of Transportation (Non-Medical):   Physical Activity:   . Days of Exercise per Week:   . Minutes of Exercise per Session:   Stress:   . Feeling of Stress :   Social Connections:   . Frequency of Communication with Friends and Family:   . Frequency of Social Gatherings with Friends and Family:   . Attends  Religious Services:   . Active Member of Clubs or Organizations:   . Attends Archivist Meetings:   Marland Kitchen Marital Status:     Current Medications:  Current Outpatient Medications:  .  acetaminophen (TYLENOL) 500 MG tablet, Take 500 mg by mouth every 6 (six) hours as needed for moderate pain., Disp: , Rfl:  .  amLODipine (NORVASC) 10 MG tablet, Take 1 tablet (10 mg total) by mouth daily., Disp:  , Rfl:  .  benzocaine (ORAJEL) 10 % mucosal gel, Use as directed 1 application in the mouth or throat 4 (four) times daily as needed for mouth pain., Disp: 5.3 g, Rfl: 0 .  Blood Glucose Monitoring Suppl (BAYER CONTOUR MONITOR) w/Device KIT, Use as directed for 3 times daily testing of  blood glucose. E11.9, Disp: 1 kit, Rfl: 0 .  carvedilol (COREG) 12.5 MG tablet, Take 1 tablet (12.5 mg total) by mouth 2 (two) times daily with a meal., Disp: 60 tablet, Rfl: 0 .  Chlorhexidine Gluconate Cloth 2 % PADS, Apply 6 each topically daily., Disp:  , Rfl:  .  diphenhydrAMINE (BENADRYL) 25 MG tablet, Take 25 mg by mouth once., Disp: , Rfl:  .  ferrous sulfate 325 (65 FE) MG tablet, Take 1 tablet (325 mg total) by mouth 2 (two) times daily with a meal., Disp: 60 tablet, Rfl: 0 .  furosemide (LASIX) 40 MG tablet, Take 1 tablet (40 mg total) by mouth daily., Disp: 30 tablet, Rfl: 0 .  glucose blood (BAYER CONTOUR TEST) test strip, Use as instructed for 3 times daily testing of blood glucose. E11.9, Disp: 100 each, Rfl: 12 .  hydrALAZINE (APRESOLINE) 50 MG tablet, Take 1 tablet (50 mg total) by mouth every 8 (eight) hours., Disp:  , Rfl:  .  INSULIN SYRINGE 1CC/29G 29G X 1/2" 1 ML MISC, Insulin syringes, Disp: 100 each, Rfl: 0 .  Lancets (ONETOUCH ULTRASOFT) lancets, Use as instructed, Disp: 100 each, Rfl: 12 .  [START ON 11/05/2019] levonorgestrel (MIRENA) 20 MCG/24HR IUD, 1 Intra Uterine Device (1 each total) by Intrauterine route once for 1 dose. To be placed in the office, Disp: 1 each, Rfl: 0 .  loratadine (CLARITIN) 10 MG tablet, Take 10 mg by mouth daily as needed for allergies., Disp: , Rfl:  .  oxyCODONE (OXY IR/ROXICODONE) 5 MG immediate release tablet, Take 1 tablet (5 mg total) by mouth every 6 (six) hours as needed for moderate pain., Disp: 10 tablet, Rfl: 0 .  pantoprazole sodium (PROTONIX) 40 mg/20 mL PACK, Take 20 mLs (40 mg total) by mouth daily., Disp: 30 mL, Rfl:  .  polyethylene glycol (MIRALAX / GLYCOLAX) 17 g packet, Take 17 g by mouth daily., Disp: 14 each, Rfl: 0  Review of Systems: Denies appetite changes, fevers, chills, fatigue, unexplained weight changes. Denies hearing loss, neck lumps or masses, mouth sores, ringing in ears or voice changes. Denies cough or  wheezing.  Denies shortness of breath. Denies chest pain or palpitations. Denies leg swelling. Denies abdominal distention, pain, blood in stools, constipation, diarrhea, nausea, vomiting, or early satiety. Denies pain with intercourse, dysuria, frequency, hematuria or incontinence. Denies hot flashes, pelvic pain, vaginal bleeding or vaginal discharge.   Denies joint pain, back pain or muscle pain/cramps. Denies itching, rash, or wounds. Denies dizziness, headaches, numbness or seizures. Denies swollen lymph nodes or glands, denies easy bruising or bleeding. Denies anxiety, depression, confusion, or decreased concentration.  Physical Exam: LMP 03/17/2015  General: ***Alert, oriented, no acute distress. HEENT: ***Posterior  oropharynx clear, sclera anicteric. Chest: ***Clear to auscultation bilaterally.  ***Port site clean. Cardiovascular: ***Regular rate and rhythm, no murmurs. Abdomen: ***Obese, soft, nontender.  Normoactive bowel sounds.  No masses or hepatosplenomegaly appreciated.  ***Well-healed scar. Extremities: ***Grossly normal range of motion.  Warm, well perfused.  No edema bilaterally. Skin: ***No rashes or lesions noted. Lymphatics: ***No cervical, supraclavicular, or inguinal adenopathy. GU: Normal appearing external genitalia without erythema, excoriation, or lesions.  Speculum exam reveals ***.  Bimanual exam reveals ***.  ***Rectovaginal exam  confirms ___.  Laboratory & Radiologic Studies: ***  Assessment & Plan: Varonica Siharath is a 64 y.o. woman with Stage *** who presents for ***.  ***  *** minutes of total time was spent for this patient encounter, including preparation, face-to-face counseling with the patient and coordination of care, and documentation of the encounter.  Jeral Pinch, MD  Division of Gynecologic Oncology  Department of Obstetrics and Gynecology  [facility]

## 2019-11-05 ENCOUNTER — Inpatient Hospital Stay: Payer: No Typology Code available for payment source | Admitting: Gynecologic Oncology

## 2019-11-05 ENCOUNTER — Emergency Department (HOSPITAL_COMMUNITY): Payer: Medicare Other

## 2019-11-05 ENCOUNTER — Inpatient Hospital Stay (HOSPITAL_COMMUNITY)
Admission: EM | Admit: 2019-11-05 | Discharge: 2019-11-22 | DRG: 871 | Disposition: A | Discharge status: Skilled Nursing Facility | Payer: Medicare Other | Attending: Internal Medicine | Admitting: Internal Medicine

## 2019-11-05 ENCOUNTER — Other Ambulatory Visit: Payer: Self-pay

## 2019-11-05 ENCOUNTER — Encounter (HOSPITAL_COMMUNITY): Payer: Self-pay

## 2019-11-05 DIAGNOSIS — R0602 Shortness of breath: Secondary | ICD-10-CM

## 2019-11-05 DIAGNOSIS — Z9114 Patient's other noncompliance with medication regimen: Secondary | ICD-10-CM

## 2019-11-05 DIAGNOSIS — E1169 Type 2 diabetes mellitus with other specified complication: Secondary | ICD-10-CM

## 2019-11-05 DIAGNOSIS — E876 Hypokalemia: Secondary | ICD-10-CM | POA: Diagnosis not present

## 2019-11-05 DIAGNOSIS — R680 Hypothermia, not associated with low environmental temperature: Secondary | ICD-10-CM | POA: Diagnosis not present

## 2019-11-05 DIAGNOSIS — Z87891 Personal history of nicotine dependence: Secondary | ICD-10-CM

## 2019-11-05 DIAGNOSIS — A599 Trichomoniasis, unspecified: Secondary | ICD-10-CM | POA: Diagnosis present

## 2019-11-05 DIAGNOSIS — R531 Weakness: Secondary | ICD-10-CM

## 2019-11-05 DIAGNOSIS — D509 Iron deficiency anemia, unspecified: Secondary | ICD-10-CM | POA: Diagnosis present

## 2019-11-05 DIAGNOSIS — E11649 Type 2 diabetes mellitus with hypoglycemia without coma: Secondary | ICD-10-CM | POA: Diagnosis not present

## 2019-11-05 DIAGNOSIS — C541 Malignant neoplasm of endometrium: Secondary | ICD-10-CM

## 2019-11-05 DIAGNOSIS — I5033 Acute on chronic diastolic (congestive) heart failure: Secondary | ICD-10-CM | POA: Diagnosis present

## 2019-11-05 DIAGNOSIS — E46 Unspecified protein-calorie malnutrition: Secondary | ICD-10-CM | POA: Diagnosis present

## 2019-11-05 DIAGNOSIS — Z8249 Family history of ischemic heart disease and other diseases of the circulatory system: Secondary | ICD-10-CM

## 2019-11-05 DIAGNOSIS — A4189 Other specified sepsis: Principal | ICD-10-CM | POA: Diagnosis present

## 2019-11-05 DIAGNOSIS — Z833 Family history of diabetes mellitus: Secondary | ICD-10-CM

## 2019-11-05 DIAGNOSIS — Z8619 Personal history of other infectious and parasitic diseases: Secondary | ICD-10-CM

## 2019-11-05 DIAGNOSIS — J1282 Pneumonia due to coronavirus disease 2019: Secondary | ICD-10-CM | POA: Diagnosis present

## 2019-11-05 DIAGNOSIS — N1832 Chronic kidney disease, stage 3b: Secondary | ICD-10-CM | POA: Diagnosis present

## 2019-11-05 DIAGNOSIS — U071 COVID-19: Secondary | ICD-10-CM | POA: Diagnosis present

## 2019-11-05 DIAGNOSIS — E870 Hyperosmolality and hypernatremia: Secondary | ICD-10-CM | POA: Diagnosis present

## 2019-11-05 DIAGNOSIS — E785 Hyperlipidemia, unspecified: Secondary | ICD-10-CM | POA: Diagnosis present

## 2019-11-05 DIAGNOSIS — Z794 Long term (current) use of insulin: Secondary | ICD-10-CM

## 2019-11-05 DIAGNOSIS — I130 Hypertensive heart and chronic kidney disease with heart failure and stage 1 through stage 4 chronic kidney disease, or unspecified chronic kidney disease: Secondary | ICD-10-CM | POA: Diagnosis present

## 2019-11-05 DIAGNOSIS — E860 Dehydration: Secondary | ICD-10-CM | POA: Diagnosis present

## 2019-11-05 DIAGNOSIS — I160 Hypertensive urgency: Secondary | ICD-10-CM | POA: Diagnosis present

## 2019-11-05 DIAGNOSIS — Z8711 Personal history of peptic ulcer disease: Secondary | ICD-10-CM

## 2019-11-05 DIAGNOSIS — K7460 Unspecified cirrhosis of liver: Secondary | ICD-10-CM

## 2019-11-05 DIAGNOSIS — E1122 Type 2 diabetes mellitus with diabetic chronic kidney disease: Secondary | ICD-10-CM | POA: Diagnosis present

## 2019-11-05 DIAGNOSIS — J189 Pneumonia, unspecified organism: Secondary | ICD-10-CM

## 2019-11-05 DIAGNOSIS — E871 Hypo-osmolality and hyponatremia: Secondary | ICD-10-CM | POA: Diagnosis not present

## 2019-11-05 DIAGNOSIS — N183 Chronic kidney disease, stage 3 unspecified: Secondary | ICD-10-CM | POA: Diagnosis present

## 2019-11-05 DIAGNOSIS — D539 Nutritional anemia, unspecified: Secondary | ICD-10-CM | POA: Diagnosis present

## 2019-11-05 DIAGNOSIS — Z79899 Other long term (current) drug therapy: Secondary | ICD-10-CM

## 2019-11-05 DIAGNOSIS — E119 Type 2 diabetes mellitus without complications: Secondary | ICD-10-CM

## 2019-11-05 DIAGNOSIS — K7581 Nonalcoholic steatohepatitis (NASH): Secondary | ICD-10-CM | POA: Diagnosis present

## 2019-11-05 DIAGNOSIS — J45909 Unspecified asthma, uncomplicated: Secondary | ICD-10-CM | POA: Diagnosis present

## 2019-11-05 LAB — BASIC METABOLIC PANEL
Anion gap: 13 (ref 5–15)
BUN: 34 mg/dL — ABNORMAL HIGH (ref 8–23)
CO2: 24 mmol/L (ref 22–32)
Calcium: 8.7 mg/dL — ABNORMAL LOW (ref 8.9–10.3)
Chloride: 112 mmol/L — ABNORMAL HIGH (ref 98–111)
Creatinine, Ser: 2.15 mg/dL — ABNORMAL HIGH (ref 0.44–1.00)
GFR calc Af Amer: 27 mL/min — ABNORMAL LOW (ref >60)
GFR calc non Af Amer: 23 mL/min — ABNORMAL LOW (ref >60)
Glucose, Bld: 117 mg/dL — ABNORMAL HIGH (ref 70–99)
Potassium: 3.6 mmol/L (ref 3.5–5.1)
Sodium: 149 mmol/L — ABNORMAL HIGH (ref 135–145)

## 2019-11-05 LAB — CBC WITH DIFFERENTIAL/PLATELET
Abs Immature Granulocytes: 0.01 10*3/uL (ref 0.00–0.07)
Basophils Absolute: 0 10*3/uL (ref 0.0–0.1)
Basophils Relative: 1 %
Eosinophils Absolute: 0 10*3/uL (ref 0.0–0.5)
Eosinophils Relative: 0 %
HCT: 30.5 % — ABNORMAL LOW (ref 36.0–46.0)
Hemoglobin: 9 g/dL — ABNORMAL LOW (ref 12.0–15.0)
Immature Granulocytes: 0 %
Lymphocytes Relative: 6 %
Lymphs Abs: 0.3 10*3/uL — ABNORMAL LOW (ref 0.7–4.0)
MCH: 29.6 pg (ref 26.0–34.0)
MCHC: 29.5 g/dL — ABNORMAL LOW (ref 30.0–36.0)
MCV: 100.3 fL — ABNORMAL HIGH (ref 80.0–100.0)
Monocytes Absolute: 0.4 10*3/uL (ref 0.1–1.0)
Monocytes Relative: 7 %
Neutro Abs: 4.6 10*3/uL (ref 1.7–7.7)
Neutrophils Relative %: 86 %
Platelets: 180 10*3/uL (ref 150–400)
RBC: 3.04 MIL/uL — ABNORMAL LOW (ref 3.87–5.11)
RDW: 19.1 % — ABNORMAL HIGH (ref 11.5–15.5)
WBC: 5.4 10*3/uL (ref 4.0–10.5)
nRBC: 0 % (ref 0.0–0.2)

## 2019-11-05 LAB — CBC
HCT: 29.8 % — ABNORMAL LOW (ref 36.0–46.0)
Hemoglobin: 8.7 g/dL — ABNORMAL LOW (ref 12.0–15.0)
MCH: 29.7 pg (ref 26.0–34.0)
MCHC: 29.2 g/dL — ABNORMAL LOW (ref 30.0–36.0)
MCV: 101.7 fL — ABNORMAL HIGH (ref 80.0–100.0)
Platelets: 189 10*3/uL (ref 150–400)
RBC: 2.93 MIL/uL — ABNORMAL LOW (ref 3.87–5.11)
RDW: 19.1 % — ABNORMAL HIGH (ref 11.5–15.5)
WBC: 5.8 10*3/uL (ref 4.0–10.5)
nRBC: 0 % (ref 0.0–0.2)

## 2019-11-05 MED ORDER — LORATADINE 10 MG PO TABS
10.0000 mg | ORAL_TABLET | Freq: Every day | ORAL | Status: DC | PRN
  Administered 2019-11-08 – 2019-11-17 (×2): 10 mg via ORAL
  Filled 2019-11-05 (×2): qty 1

## 2019-11-05 MED ORDER — POLYETHYLENE GLYCOL 3350 17 G PO PACK
17.0000 g | PACK | Freq: Every day | ORAL | Status: DC
  Administered 2019-11-06 – 2019-11-16 (×6): 17 g via ORAL
  Filled 2019-11-05 (×12): qty 1

## 2019-11-05 MED ORDER — FUROSEMIDE 20 MG PO TABS
40.0000 mg | ORAL_TABLET | Freq: Every day | ORAL | Status: DC
  Administered 2019-11-05 – 2019-11-06 (×2): 40 mg via ORAL
  Filled 2019-11-05 (×2): qty 2

## 2019-11-05 MED ORDER — CARVEDILOL 12.5 MG PO TABS
12.5000 mg | ORAL_TABLET | Freq: Two times a day (BID) | ORAL | Status: DC
  Administered 2019-11-06 – 2019-11-07 (×4): 12.5 mg via ORAL
  Administered 2019-11-08: 17:00:00 6.25 mg via ORAL
  Administered 2019-11-08 – 2019-11-13 (×10): 12.5 mg via ORAL
  Filled 2019-11-05 (×15): qty 1

## 2019-11-05 MED ORDER — SODIUM CHLORIDE 0.9% FLUSH: 3.0000 mL | Freq: Once | INTRAVENOUS | Status: DC

## 2019-11-05 MED ORDER — PANTOPRAZOLE SODIUM 40 MG PO PACK
40.0000 mg | PACK | Freq: Every day | ORAL | Status: DC
  Administered 2019-11-05 – 2019-11-12 (×8): 40 mg via ORAL
  Filled 2019-11-05 (×8): qty 20

## 2019-11-05 MED ORDER — OXYCODONE HCL 5 MG PO TABS: 5.0000 mg | ORAL_TABLET | Freq: Four times a day (QID) | ORAL | Status: DC | PRN

## 2019-11-05 MED ORDER — HYDRALAZINE HCL 50 MG PO TABS
50.0000 mg | ORAL_TABLET | Freq: Three times a day (TID) | ORAL | Status: DC
  Administered 2019-11-05 – 2019-11-07 (×4): 50 mg via ORAL
  Filled 2019-11-05 (×2): qty 1
  Filled 2019-11-05 (×2): qty 2

## 2019-11-05 MED ORDER — AMLODIPINE BESYLATE 10 MG PO TABS
10.0000 mg | ORAL_TABLET | Freq: Every day | ORAL | Status: DC
  Administered 2019-11-05 – 2019-11-22 (×18): 10 mg via ORAL
  Filled 2019-11-05 (×3): qty 1
  Filled 2019-11-05: qty 2
  Filled 2019-11-05 (×9): qty 1
  Filled 2019-11-05: qty 2
  Filled 2019-11-05 (×4): qty 1

## 2019-11-05 MED ORDER — CHLORHEXIDINE GLUCONATE CLOTH 2 % EX PADS
6.0000 | MEDICATED_PAD | Freq: Every day | CUTANEOUS | Status: DC
  Administered 2019-11-07 – 2019-11-22 (×16): 6 via TOPICAL

## 2019-11-05 MED ORDER — ACETAMINOPHEN 500 MG PO TABS
500.0000 mg | ORAL_TABLET | Freq: Four times a day (QID) | ORAL | Status: DC | PRN
  Administered 2019-11-06 (×2): 500 mg via ORAL
  Filled 2019-11-05 (×2): qty 1

## 2019-11-05 NOTE — ED Notes (Signed)
Pt to ED because she says she is not able to take care of herself at home.  Pt st's her husband isn't able to help her.  Pt requesting to go back to Nursing Home

## 2019-11-05 NOTE — ED Provider Notes (Signed)
[facility] EMERGENCY DEPARTMENT Provider Note   CSN: [id number] Arrival date & time: 11/05/19  1232     History No chief complaint on file. weakness  Vanessa [name] is a 64 y.o. female.  She has a history of endometrial cancer not currently being treated along with diabetes CKD.  She was just admitted April 9 to  April 12 and from there went to rehab.  They adjusted some blood pressure medicines and had her on antibiotics for MSSA.  She said she did really well at rehab and was becoming more independent and they discharged her to home 2 days ago.  She lives with an infirmed husband.  She said since she has been home she has declined quickly and cannot care for herself.  She says she feels bad but cannot really explain what that means.  Denies any specific pain.  No recent falls.  The history is provided by the patient.  Weakness Severity:  Unable to specify Onset quality:  Gradual Duration:  2 days Timing:  Constant Progression:  Worsening Chronicity:  Recurrent Context: recent infection   Relieved by:  Nothing Worsened by:  Activity Ineffective treatments:  None tried Associated symptoms: difficulty walking and shortness of breath (sometimes)   Associated symptoms: no abdominal pain, no chest pain, no dysuria, no fever, no foul-smelling urine, no loss of consciousness and no nausea   Risk factors: new medications        Past Medical History:  Diagnosis Date  . Anemia 10/09/2019  . Asthma   . CKD (chronic kidney disease) stage 3, GFR 30-59 ml/min   . Dental disease   . Diabetes mellitus without complication ([place])   . Endometrial cancer ([place])   . Esophageal dysmotility   . Heart murmur   . HLD (hyperlipidemia)   . Hypertension   . MSSA bacteremia 09/2019  . Obesity, Class III, BMI 40-49.9 (morbid obesity) ([place])   . Pressure ulcer     Patient Active Problem List   Diagnosis Date Noted  . Allergic angioedema 10/19/2019  . Anasarca 10/18/2019  .  Angioedema 10/18/2019  . Hypertensive emergency 10/18/2019  . Trichimoniasis 10/17/2019  . Anemia 10/05/2019  . Esophageal dysmotility 10/04/2019  . Pressure injury of skin 09/22/2019  . 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 Left humeral fracture 09/21/2019  . Iron deficiency anemia due to chronic blood loss 09/21/2019  . MSSA bacteremia 09/18/2019  . Postmenopausal bleeding 09/15/2019  . Thrombocytopenia ([place]) 09/15/2019  . Generalized pruritus 09/15/2019  . Benign hypertension with CKD (chronic kidney disease) stage III 09/15/2019  . Hyperuricemia 09/15/2019  . Hypokalemia 09/15/2019  . Symptomatic anemia 09/14/2019  . Non compliance w medication regimen 03/24/2015  . Dyslipidemia 03/24/2015  . DM type 2 (diabetes mellitus, type 2) ([place]) 05/23/2014  . HTN (hypertension) 05/23/2014    Past Surgical History:  Procedure Laterality Date  . BIOPSY  10/08/2019   Procedure: BIOPSY;  Surgeon: Clarene Essex, MD;  Location: [facility] ENDOSCOPY;  Service: Endoscopy;;  . ESOPHAGOGASTRODUODENOSCOPY N/A 10/08/2019   Procedure: ESOPHAGOGASTRODUODENOSCOPY (EGD);  Surgeon: Clarene Essex, MD;  Location: [facility];  Service: Endoscopy;  Laterality: N/A;  . TYMPANOSTOMY TUBE PLACEMENT Bilateral      OB History    Gravida  5   Para  5   Term  4   Preterm  1   AB      Living  5     SAB      TAB  Ectopic      Multiple      Live Births  5           Family History  Problem Relation Age of Onset  . Stroke Mother   . Diabetes Mother   . Hypertension Mother   . Cancer Mother        possible ovarian  . Diabetes Brother   . Hypertension Brother   . Cancer Maternal Grandmother        possible ovarian    Social History   Tobacco Use  . Smoking status: Former Research scientist (life sciences)  . Smokeless tobacco: Never Used  . Tobacco comment: " years ago ", >16yr  Substance Use Topics  . Alcohol use: Yes    Comment: occasional  . Drug use: No    Home Medications Prior to Admission  medications   Medication Sig Start Date End Date Taking? Authorizing Provider  acetaminophen (TYLENOL) 500 MG tablet Take 500 mg by mouth every 6 (six) hours as needed for moderate pain.    [provider]  amLODipine (NORVASC) 10 MG tablet Take 1 tablet (10 mg total) by mouth daily. 10/11/19   Mikhail, MVelta Addison DO  benzocaine (ORAJEL) 10 % mucosal gel Use as directed 1 application in the mouth or throat 4 (four) times daily as needed for mouth pain. 09/25/19   Arrien, MJimmy Picket MD  Blood Glucose Monitoring Suppl (BAYER CONTOUR MONITOR) w/Device KIT Use as directed for 3 times daily testing of blood glucose. E11.9 12/01/16   LMaren Reamer MD  carvedilol (COREG) 12.5 MG tablet Take 1 tablet (12.5 mg total) by mouth 2 (two) times daily with a meal. 10/21/19   LLittle Ishikawa MD  Chlorhexidine Gluconate Cloth 2 % PADS Apply 6 each topically daily. 10/10/19   Mikhail, MVelta Addison DO  diphenhydrAMINE (BENADRYL) 25 MG tablet Take 25 mg by mouth once.    [provider]  ferrous sulfate 325 (65 FE) MG tablet Take 1 tablet (325 mg total) by mouth 2 (two) times daily with a meal. 09/25/19 10/25/19  Arrien, MJimmy Picket MD  furosemide (LASIX) 40 MG tablet Take 1 tablet (40 mg total) by mouth daily. 10/21/19 11/20/19  LLittle Ishikawa MD  glucose blood (BAYER CONTOUR TEST) test strip Use as instructed for 3 times daily testing of blood glucose. E11.9 12/01/16   LMaren Reamer MD  hydrALAZINE (APRESOLINE) 50 MG tablet Take 1 tablet (50 mg total) by mouth every 8 (eight) hours. 10/10/19   Mikhail, MVelta Addison DO  INSULIN SYRINGE 1CC/29G 29G X 1/2" 1 ML MISC Insulin syringes 03/06/14   ORolene Course PA-C  Lancets ([facility]) lancets Use as instructed 11/29/16   LMaren Reamer MD  levonorgestrel (MIRENA) 20 MCG/24HR IUD 1 Intra Uterine Device (1 each total) by Intrauterine route once for 1 dose. To be placed in the office 11/05/19 11/05/19  CJoylene JohnD, NP  loratadine  (CLARITIN) 10 MG tablet Take 10 mg by mouth daily as needed for allergies.    [provider]  oxyCODONE (OXY IR/ROXICODONE) 5 MG immediate release tablet Take 1 tablet (5 mg total) by mouth every 6 (six) hours as needed for moderate pain. 10/10/19   Mikhail, MVelta Addison DO  pantoprazole sodium (PROTONIX) 40 mg/20 mL PACK Take 20 mLs (40 mg total) by mouth daily. 10/10/19   Mikhail, MVelta Addison DO  polyethylene glycol (MIRALAX / GLYCOLAX) 17 g packet Take 17 g by mouth daily. 09/25/19   Arrien, MJimmy Picket MD  Allergies    Patient has no known allergies.  Review of Systems   Review of Systems  Constitutional: Positive for fatigue. Negative for fever.  HENT: Negative for sore throat.   Eyes: Negative for visual disturbance.  Respiratory: Positive for shortness of breath (sometimes).   Cardiovascular: Negative for chest pain.  Gastrointestinal: Negative for abdominal pain and nausea.  Genitourinary: Negative for dysuria.  Musculoskeletal: Positive for gait problem.  Skin: Negative for rash.  Neurological: Positive for weakness. Negative for loss of consciousness.    Physical Exam Updated Vital Signs BP (!) 184/89 (BP Location: Left Arm)   Pulse 66   Temp (!) 97.4 F (36.3 C) (Axillary)   Resp 18   Ht 5' (1.524 m)   LMP 03/17/2015   SpO2 94%   BMI 43.18 kg/m   Physical Exam Vitals and nursing note reviewed.  Constitutional:      General: She is not in acute distress.    Appearance: Normal appearance. She is well-developed.  HENT:     Head: Normocephalic and atraumatic.  Eyes:     Conjunctiva/sclera: Conjunctivae normal.  Cardiovascular:     Rate and Rhythm: Normal rate and regular rhythm.     Heart sounds: No murmur.  Pulmonary:     Effort: Pulmonary effort is normal. No respiratory distress.     Breath sounds: Normal breath sounds.  Abdominal:     Palpations: Abdomen is soft.     Tenderness: There is no abdominal tenderness.  Musculoskeletal:        General:  No deformity or signs of injury.     Cervical back: Neck supple.  Skin:    General: Skin is warm and dry.  Neurological:     General: No focal deficit present.     Mental Status: She is alert.     Comments: She is awake and oriented.  She is moving all extremities.  She is generally weak although there is not any focal weakness.     ED Results / Procedures / Treatments   Labs (all labs ordered are listed, but only abnormal results are displayed) Labs Reviewed  BASIC METABOLIC PANEL - Abnormal; Notable for the following components:      Result Value   Sodium 149 (*)    Chloride 112 (*)    Glucose, Bld 117 (*)    BUN 34 (*)    Creatinine, Ser 2.15 (*)    Calcium 8.7 (*)    GFR calc non Af Amer 23 (*)    GFR calc Af Amer 27 (*)    All other components within normal limits  CBC - Abnormal; Notable for the following components:   RBC 2.93 (*)    Hemoglobin 8.7 (*)    HCT 29.8 (*)    MCV 101.7 (*)    MCHC 29.2 (*)    RDW 19.1 (*)    All other components within normal limits  URINALYSIS, ROUTINE W REFLEX MICROSCOPIC  CBC WITH DIFFERENTIAL/PLATELET  CBG MONITORING, ED    EKG EKG Interpretation  Date/Time:  Tuesday November 05 2019 12:49:18 EDT Ventricular Rate:  64 PR Interval:  166 QRS Duration: 84 QT Interval:  454 QTC Calculation: 468 R Axis:   107 Text Interpretation: Normal sinus rhythm Rightward axis Cannot rule out Anterior infarct , age undetermined Abnormal ECG No significant change since 4/21 Confirmed by Aletta Edouard [phone]) on 11/05/2019 3:37:15 PM   Radiology DG Chest Port 1 View  Result Date: 11/05/2019 CLINICAL DATA:  Weakness.  Found on floor today. EXAM: PORTABLE CHEST 1 VIEW COMPARISON:  10/18/2019 FINDINGS: Stable heart size and mediastinal contours with mild cardiomegaly. Hazy opacity at both lung bases likely combination of pleural fluid and airspace disease/atelectasis. Vascular congestion. No pneumothorax. Left proximal humerus fracture appears  subacute with surrounding heterotopic ossification, also seen on 09/17/2019 shoulder radiograph. IMPRESSION: 1. Hazy opacity at both lung bases likely combination of pleural fluid and airspace disease/atelectasis. This is stable on the right but progressive on the left from prior. 2. Stable mild cardiomegaly.  Vascular congestion. Electronically Signed   By: Keith Rake M.D.   On: 11/05/2019 17:02    Procedures Procedures (including critical care time)  Medications Ordered in ED Medications  sodium chloride flush (NS) 0.9 % injection 3 mL (has no administration in time range)  acetaminophen (TYLENOL) tablet 500 mg (500 mg Oral Given 11/06/19 0801)  amLODipine (NORVASC) tablet 10 mg (10 mg Oral Given 11/05/19 2007)  carvedilol (COREG) tablet 12.5 mg (12.5 mg Oral Given 11/06/19 0759)  Chlorhexidine Gluconate Cloth 2 % PADS 6 each (has no administration in time range)  furosemide (LASIX) tablet 40 mg (40 mg Oral Given 11/05/19 2008)  hydrALAZINE (APRESOLINE) tablet 50 mg (50 mg Oral Given 11/06/19 0759)  loratadine (CLARITIN) tablet 10 mg (has no administration in time range)  oxyCODONE (Oxy IR/ROXICODONE) immediate release tablet 5 mg (has no administration in time range)  pantoprazole sodium (PROTONIX) 40 mg/20 mL oral suspension 40 mg (40 mg Oral Given 11/05/19 2125)  polyethylene glycol (MIRALAX / GLYCOLAX) packet 17 g (has no administration in time range)    ED Course  I have reviewed the triage vital signs and the nursing notes.  Pertinent labs & imaging results that were available during my care of the patient were reviewed by me and considered in my medical decision making (see chart for details).  Clinical Course as of Nov 05 1806  Tue Nov 05, 2019  1657 Chest x-ray interpreted by me as poor positioning probably no infiltrate.  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   [MB]  [place] work is seen the patient although he states can be  very difficult to place her due to the fact she already was in rehab and left.   [MB]    Clinical Course User Index [MB] Hayden Rasmussen, MD   MDM Rules/Calculators/A&P                     Clinical Course as of Nov 05 1806  Tue Nov 05, 2019  1657 Chest x-ray interpreted by me as poor positioning probably no infiltrate.  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   [MB]  [place] work is seen the patient although he states can be very difficult to place her due to the fact she already was in rehab and left.   [MB]    Clinical Course User Index [MB] Hayden Rasmussen, MD   This patient complains of generalized weakness and inability to care for herself; this involves an extensive number of treatment Options and is a complaint that carries with it a high risk of complications and Morbidity. The differential includes anemia, metabolic derangement, infection, failure to thrive  I ordered, reviewed and interpreted labs, which included CBC showing normal white count, low hemoglobin stable from baseline, chemistry showing elevated creatinine, which is at baseline. I ordered medication her home medications I ordered imaging studies which included chest x-ray and  I independently    visualized and interpreted imaging which showed poor inspiration atelectasis Previous records obtained and reviewed in epic including her last admission and discharged to rehab I consulted transitions of care and discussed lab and imaging findings.  They asked if I could get a PT consult and they will pursue seeing if she has any days left to go back to rehab  Critical Interventions: None  After the interventions stated above, I reevaluated the patient and found hemodynamically stable.  She reported in the ER until the morning when physical therapy and social work can identify if she can be placed safely   inal Clinical Impression(s) / ED Diagnoses Final diagnoses:    Generalized weakness    Rx / DC Orders ED Discharge Orders    None       Hayden Rasmussen, MD 11/06/19 1015

## 2019-11-05 NOTE — Care Management (Signed)
ED CM met with patient to discuss transitional care.  Patient states she was discharged from [facility] but is unable to care for self patient states her husband has also been diagnosis with cancer.  TOC team will contact [facility] to find out if patient can return.  PT order placed.

## 2019-11-05 NOTE — ED Triage Notes (Addendum)
Patient arrived by [facility] from home after being just discharged from [facility] this past weekend. Patient with increased weakness and found on floor today, has not been taking daily meds as prescribed. Patient alert and oriented but states that she is too weak to take care of herself at home

## 2019-11-06 ENCOUNTER — Emergency Department (HOSPITAL_COMMUNITY): Payer: Medicare Other

## 2019-11-06 ENCOUNTER — Inpatient Hospital Stay (HOSPITAL_COMMUNITY): Payer: Medicare Other

## 2019-11-06 DIAGNOSIS — E1121 Type 2 diabetes mellitus with diabetic nephropathy: Secondary | ICD-10-CM | POA: Diagnosis not present

## 2019-11-06 DIAGNOSIS — E785 Hyperlipidemia, unspecified: Secondary | ICD-10-CM | POA: Diagnosis present

## 2019-11-06 DIAGNOSIS — E870 Hyperosmolality and hypernatremia: Secondary | ICD-10-CM | POA: Diagnosis present

## 2019-11-06 DIAGNOSIS — N1832 Chronic kidney disease, stage 3b: Secondary | ICD-10-CM | POA: Diagnosis present

## 2019-11-06 DIAGNOSIS — U071 COVID-19: Secondary | ICD-10-CM | POA: Diagnosis present

## 2019-11-06 DIAGNOSIS — E860 Dehydration: Secondary | ICD-10-CM | POA: Diagnosis present

## 2019-11-06 DIAGNOSIS — A4189 Other specified sepsis: Secondary | ICD-10-CM | POA: Diagnosis present

## 2019-11-06 DIAGNOSIS — K7460 Unspecified cirrhosis of liver: Secondary | ICD-10-CM | POA: Diagnosis not present

## 2019-11-06 DIAGNOSIS — C541 Malignant neoplasm of endometrium: Secondary | ICD-10-CM | POA: Diagnosis present

## 2019-11-06 DIAGNOSIS — E871 Hypo-osmolality and hyponatremia: Secondary | ICD-10-CM | POA: Diagnosis not present

## 2019-11-06 DIAGNOSIS — R531 Weakness: Secondary | ICD-10-CM

## 2019-11-06 DIAGNOSIS — T68XXXS Hypothermia, sequela: Secondary | ICD-10-CM | POA: Diagnosis not present

## 2019-11-06 DIAGNOSIS — R680 Hypothermia, not associated with low environmental temperature: Secondary | ICD-10-CM | POA: Diagnosis not present

## 2019-11-06 DIAGNOSIS — E11649 Type 2 diabetes mellitus with hypoglycemia without coma: Secondary | ICD-10-CM | POA: Diagnosis not present

## 2019-11-06 DIAGNOSIS — I5033 Acute on chronic diastolic (congestive) heart failure: Secondary | ICD-10-CM | POA: Diagnosis present

## 2019-11-06 DIAGNOSIS — M7989 Other specified soft tissue disorders: Secondary | ICD-10-CM | POA: Diagnosis not present

## 2019-11-06 DIAGNOSIS — D509 Iron deficiency anemia, unspecified: Secondary | ICD-10-CM | POA: Diagnosis present

## 2019-11-06 DIAGNOSIS — K7581 Nonalcoholic steatohepatitis (NASH): Secondary | ICD-10-CM | POA: Diagnosis present

## 2019-11-06 DIAGNOSIS — J1282 Pneumonia due to coronavirus disease 2019: Secondary | ICD-10-CM | POA: Diagnosis present

## 2019-11-06 DIAGNOSIS — D539 Nutritional anemia, unspecified: Secondary | ICD-10-CM | POA: Diagnosis present

## 2019-11-06 DIAGNOSIS — I130 Hypertensive heart and chronic kidney disease with heart failure and stage 1 through stage 4 chronic kidney disease, or unspecified chronic kidney disease: Secondary | ICD-10-CM | POA: Diagnosis present

## 2019-11-06 DIAGNOSIS — E1122 Type 2 diabetes mellitus with diabetic chronic kidney disease: Secondary | ICD-10-CM | POA: Diagnosis present

## 2019-11-06 DIAGNOSIS — N183 Chronic kidney disease, stage 3 unspecified: Secondary | ICD-10-CM | POA: Diagnosis present

## 2019-11-06 DIAGNOSIS — A599 Trichomoniasis, unspecified: Secondary | ICD-10-CM | POA: Diagnosis present

## 2019-11-06 DIAGNOSIS — E876 Hypokalemia: Secondary | ICD-10-CM | POA: Diagnosis not present

## 2019-11-06 DIAGNOSIS — E46 Unspecified protein-calorie malnutrition: Secondary | ICD-10-CM | POA: Diagnosis present

## 2019-11-06 DIAGNOSIS — I160 Hypertensive urgency: Secondary | ICD-10-CM | POA: Diagnosis present

## 2019-11-06 DIAGNOSIS — R609 Edema, unspecified: Secondary | ICD-10-CM | POA: Diagnosis not present

## 2019-11-06 DIAGNOSIS — J45909 Unspecified asthma, uncomplicated: Secondary | ICD-10-CM | POA: Diagnosis present

## 2019-11-06 DIAGNOSIS — J189 Pneumonia, unspecified organism: Secondary | ICD-10-CM | POA: Diagnosis present

## 2019-11-06 DIAGNOSIS — Z794 Long term (current) use of insulin: Secondary | ICD-10-CM | POA: Diagnosis not present

## 2019-11-06 DIAGNOSIS — Z79899 Other long term (current) drug therapy: Secondary | ICD-10-CM | POA: Diagnosis not present

## 2019-11-06 LAB — COMPREHENSIVE METABOLIC PANEL
ALT: 5 U/L (ref 0–44)
AST: 18 U/L (ref 15–41)
Albumin: 2.8 g/dL — ABNORMAL LOW (ref 3.5–5.0)
Alkaline Phosphatase: 113 U/L (ref 38–126)
Anion gap: 10 (ref 5–15)
BUN: 28 mg/dL — ABNORMAL HIGH (ref 8–23)
CO2: 26 mmol/L (ref 22–32)
Calcium: 8.7 mg/dL — ABNORMAL LOW (ref 8.9–10.3)
Chloride: 112 mmol/L — ABNORMAL HIGH (ref 98–111)
Creatinine, Ser: 1.86 mg/dL — ABNORMAL HIGH (ref 0.44–1.00)
GFR calc Af Amer: 32 mL/min — ABNORMAL LOW (ref >60)
GFR calc non Af Amer: 28 mL/min — ABNORMAL LOW (ref >60)
Glucose, Bld: 109 mg/dL — ABNORMAL HIGH (ref 70–99)
Potassium: 3.5 mmol/L (ref 3.5–5.1)
Sodium: 148 mmol/L — ABNORMAL HIGH (ref 135–145)
Total Bilirubin: 0.6 mg/dL (ref 0.3–1.2)
Total Protein: 7 g/dL (ref 6.5–8.1)

## 2019-11-06 LAB — URINALYSIS, ROUTINE W REFLEX MICROSCOPIC
Bacteria, UA: NONE SEEN
Bilirubin Urine: NEGATIVE
Glucose, UA: NEGATIVE mg/dL
Ketones, ur: NEGATIVE mg/dL
Nitrite: NEGATIVE
Protein, ur: 100 mg/dL — AB
RBC / HPF: >50 RBC/hpf — ABNORMAL HIGH (ref 0–5)
Specific Gravity, Urine: 1.01 (ref 1.005–1.030)
pH: 6 (ref 5.0–8.0)

## 2019-11-06 LAB — VITAMIN B12: Vitamin B-12: 602 pg/mL (ref 180–914)

## 2019-11-06 LAB — BRAIN NATRIURETIC PEPTIDE: B Natriuretic Peptide: 656 pg/mL — ABNORMAL HIGH (ref 0.0–100.0)

## 2019-11-06 LAB — TSH: TSH: 5.158 u[IU]/mL — ABNORMAL HIGH (ref 0.350–4.500)

## 2019-11-06 LAB — FIBRINOGEN: Fibrinogen: 614 mg/dL — ABNORMAL HIGH (ref 210–475)

## 2019-11-06 LAB — C-REACTIVE PROTEIN: CRP: 8.7 mg/dL — ABNORMAL HIGH (ref <1.0)

## 2019-11-06 LAB — FERRITIN: Ferritin: 172 ng/mL (ref 11–307)

## 2019-11-06 LAB — D-DIMER, QUANTITATIVE: D-Dimer, Quant: 1.54 ug/mL-FEU — ABNORMAL HIGH (ref 0.00–0.50)

## 2019-11-06 LAB — CBG MONITORING, ED: Glucose-Capillary: 150 mg/dL — ABNORMAL HIGH (ref 70–99)

## 2019-11-06 LAB — RESPIRATORY PANEL BY RT PCR (FLU A&B, COVID)
Influenza A by PCR: NEGATIVE
Influenza B by PCR: NEGATIVE
SARS Coronavirus 2 by RT PCR: POSITIVE — AB

## 2019-11-06 LAB — TROPONIN I (HIGH SENSITIVITY)
Troponin I (High Sensitivity): 8 ng/L (ref <18)
Troponin I (High Sensitivity): 9 ng/L (ref <18)

## 2019-11-06 LAB — LACTATE DEHYDROGENASE: LDH: 194 U/L — ABNORMAL HIGH (ref 98–192)

## 2019-11-06 LAB — GLUCOSE, CAPILLARY: Glucose-Capillary: 112 mg/dL — ABNORMAL HIGH (ref 70–99)

## 2019-11-06 LAB — PROCALCITONIN: Procalcitonin: 0.46 ng/mL

## 2019-11-06 MED ORDER — HYDROCOD POLST-CPM POLST ER 10-8 MG/5ML PO SUER: 5.0000 mL | Freq: Two times a day (BID) | ORAL | Status: DC | PRN

## 2019-11-06 MED ORDER — GUAIFENESIN-DM 100-10 MG/5ML PO SYRP
10.0000 mL | ORAL_SOLUTION | ORAL | Status: DC | PRN
  Administered 2019-11-07 – 2019-11-15 (×3): 10 mL via ORAL
  Filled 2019-11-06 (×3): qty 10

## 2019-11-06 MED ORDER — ACETAMINOPHEN 325 MG PO TABS
650.0000 mg | ORAL_TABLET | Freq: Four times a day (QID) | ORAL | Status: DC | PRN
  Administered 2019-11-08 – 2019-11-21 (×3): 650 mg via ORAL
  Filled 2019-11-06 (×4): qty 2

## 2019-11-06 MED ORDER — INSULIN ASPART 100 UNIT/ML ~~LOC~~ SOLN: 0.0000 [IU] | Freq: Every day | SUBCUTANEOUS | Status: DC

## 2019-11-06 MED ORDER — ZINC SULFATE 220 (50 ZN) MG PO CAPS
220.0000 mg | ORAL_CAPSULE | Freq: Every day | ORAL | Status: DC
  Administered 2019-11-06 – 2019-11-22 (×17): 220 mg via ORAL
  Filled 2019-11-06 (×17): qty 1

## 2019-11-06 MED ORDER — FUROSEMIDE 10 MG/ML IJ SOLN
40.0000 mg | Freq: Once | INTRAMUSCULAR | Status: AC
  Administered 2019-11-06: 40 mg via INTRAVENOUS
  Filled 2019-11-06: qty 4

## 2019-11-06 MED ORDER — ASCORBIC ACID 500 MG PO TABS
500.0000 mg | ORAL_TABLET | Freq: Every day | ORAL | Status: DC
  Administered 2019-11-06 – 2019-11-22 (×17): 500 mg via ORAL
  Filled 2019-11-06 (×17): qty 1

## 2019-11-06 MED ORDER — FUROSEMIDE 10 MG/ML IJ SOLN: 40.0000 mg | Freq: Every day | INTRAMUSCULAR | Status: DC

## 2019-11-06 MED ORDER — ENOXAPARIN SODIUM 30 MG/0.3ML ~~LOC~~ SOLN: 30.0000 mg | SUBCUTANEOUS | Status: DC

## 2019-11-06 MED ORDER — FUROSEMIDE 40 MG PO TABS
40.0000 mg | ORAL_TABLET | Freq: Every day | ORAL | Status: DC
  Administered 2019-11-07: 09:00:00 40 mg via ORAL
  Filled 2019-11-06: qty 1

## 2019-11-06 MED ORDER — ALBUTEROL SULFATE HFA 108 (90 BASE) MCG/ACT IN AERS
2.0000 | INHALATION_SPRAY | RESPIRATORY_TRACT | Status: DC
  Administered 2019-11-06 – 2019-11-22 (×94): 2 via RESPIRATORY_TRACT
  Filled 2019-11-06 (×2): qty 6.7

## 2019-11-06 MED ORDER — ONDANSETRON HCL 4 MG/2ML IJ SOLN: 4.0000 mg | Freq: Four times a day (QID) | INTRAMUSCULAR | Status: DC | PRN

## 2019-11-06 MED ORDER — ONDANSETRON HCL 4 MG PO TABS: 4.0000 mg | ORAL_TABLET | Freq: Four times a day (QID) | ORAL | Status: DC | PRN

## 2019-11-06 MED ORDER — INSULIN ASPART 100 UNIT/ML ~~LOC~~ SOLN
0.0000 [IU] | Freq: Three times a day (TID) | SUBCUTANEOUS | Status: DC
  Administered 2019-11-06: 2 [IU] via SUBCUTANEOUS
  Administered 2019-11-08: 5 [IU] via SUBCUTANEOUS
  Administered 2019-11-08: 2 [IU] via SUBCUTANEOUS
  Administered 2019-11-08: 3 [IU] via SUBCUTANEOUS
  Administered 2019-11-09: 2 [IU] via SUBCUTANEOUS
  Administered 2019-11-09: 3 [IU] via SUBCUTANEOUS
  Administered 2019-11-09 – 2019-11-11 (×4): 2 [IU] via SUBCUTANEOUS
  Administered 2019-11-11 – 2019-11-12 (×5): 3 [IU] via SUBCUTANEOUS
  Administered 2019-11-13 – 2019-11-16 (×8): 2 [IU] via SUBCUTANEOUS
  Administered 2019-11-17: 3 [IU] via SUBCUTANEOUS
  Administered 2019-11-18 – 2019-11-20 (×7): 2 [IU] via SUBCUTANEOUS

## 2019-11-06 NOTE — ED Notes (Signed)
Pt called out requesting a snack and light adjustment. This EMT attended to her needs accordingly.

## 2019-11-06 NOTE — ED Notes (Signed)
Pt called out stating she was still cold, would like some tylenol, and would like apple juice with it. This EMT adjusted the temperature in the pts room to make her more comfortable and informed Monique, RN of additional requests.

## 2019-11-06 NOTE — NC FL2 (Signed)
[place] LEVEL OF CARE SCREENING TOOL     IDENTIFICATION  Patient Name: Vanessa [name] Birthdate: [birth year] Sex: female Admission Date (Current Location): 11/05/2019  [facility] and [place] Number:  Herbalist and Address:  [facility], [place] [address] [place], [place] [address]      Provider Number: [id number]  Attending Physician Name and Address:  Mckinley Jewel, MD  Relative Name and Phone Number:  Emma-Lee Oddo [phone]    Current Level of Care: Hospital Recommended Level of Care: [place] Prior Approval Number:    Date Approved/Denied:   PASRR Number: [id number]  Discharge Plan: SNF    Current Diagnoses: Patient Active Problem List   Diagnosis Date Noted  . CKD (chronic kidney disease) stage 3, GFR 30-59 ml/min   . Hypernatremia   . COVID-19 virus infection   . Generalized weakness   . Hypertensive urgency   . Allergic angioedema 10/19/2019  . Anasarca 10/18/2019  . Angioedema 10/18/2019  . Hypertensive emergency 10/18/2019  . Trichimoniasis 10/17/2019  . Macrocytic anemia 10/05/2019  . Esophageal dysmotility 10/04/2019  . Pressure injury of skin 09/22/2019  . Endometrial cancer ([place]) 09/21/2019  . Left humeral fracture 09/21/2019  . Iron deficiency anemia due to chronic blood loss 09/21/2019  . MSSA bacteremia 09/18/2019  . Postmenopausal bleeding 09/15/2019  . Thrombocytopenia ([place]) 09/15/2019  . Generalized pruritus 09/15/2019  . Benign hypertension with CKD (chronic kidney disease) stage III 09/15/2019  . Hyperuricemia 09/15/2019  . Hypokalemia 09/15/2019  . Symptomatic anemia 09/14/2019  . Non compliance w medication regimen 03/24/2015  . Dyslipidemia 03/24/2015  . DM type 2 (diabetes mellitus, type 2) ([place]) 05/23/2014  . HTN (hypertension) 05/23/2014    Orientation RESPIRATION BLADDER Height & Weight     Self, Time, Situation, Place  Normal Continent Weight:   Height:   5' (152.4 cm)  BEHAVIORAL SYMPTOMS/MOOD NEUROLOGICAL BOWEL NUTRITION STATUS      Continent Diet(heart healthy)  AMBULATORY STATUS COMMUNICATION OF NEEDS Skin   Extensive Assist Verbally Other (Comment)(Venous stasis ulcer left scattered ulcers (small) throughout the Left lower leg.)                       Personal Care Assistance Level of Assistance  Bathing, Feeding, Dressing Bathing Assistance: Limited assistance Feeding assistance: Limited assistance Dressing Assistance: Limited assistance     Functional Limitations Info  Sight, Hearing, Speech Sight Info: Adequate(wears glasses) Hearing Info: Adequate Speech Info: Adequate    SPECIAL CARE FACTORS FREQUENCY  PT (By licensed PT), OT (By licensed OT)     PT Frequency: 5x weekly OT Frequency: 5x weekly            Contractures Contractures Info: Not present    Additional Factors Info  Code Status, Allergies Code Status Info: Full Allergies Info: NKA           Current Medications (11/06/2019):  This is the current hospital active medication list Current Facility-Administered Medications  Medication Dose Route Frequency Provider Last Rate Last Admin  . acetaminophen (TYLENOL) tablet 500 mg  500 mg Oral Q6H PRN Hayden Rasmussen, MD   500 mg at 11/06/19 0801  . acetaminophen (TYLENOL) tablet 650 mg  650 mg Oral Q6H PRN Pahwani, Rinka R, MD      . albuterol (VENTOLIN HFA) 108 (90 Base) MCG/ACT inhaler 2 puff  2 puff Inhalation Q4H Charlesetta Shanks, MD      . amLODipine (NORVASC)  tablet 10 mg  10 mg Oral Daily Hayden Rasmussen, MD   10 mg at 11/06/19 1013  . ascorbic acid (VITAMIN C) tablet 500 mg  500 mg Oral Daily Pahwani, Rinka R, MD      . carvedilol (COREG) tablet 12.5 mg  12.5 mg Oral BID WC Hayden Rasmussen, MD   12.5 mg at 11/06/19 0759  . Chlorhexidine Gluconate Cloth 2 % PADS 6 each  6 each Topical Daily Hayden Rasmussen, MD      . chlorpheniramine-HYDROcodone (TUSSIONEX) 10-8 MG/5ML suspension 5 mL  5 mL  Oral Q12H PRN Pahwani, Rinka R, MD      . enoxaparin (LOVENOX) injection 30 mg  30 mg Subcutaneous Q24H Pahwani, Rinka R, MD      . furosemide (LASIX) injection 40 mg  40 mg Intravenous Daily Pahwani, Rinka R, MD      . guaiFENesin-dextromethorphan (ROBITUSSIN DM) 100-10 MG/5ML syrup 10 mL  10 mL Oral Q4H PRN Pahwani, Rinka R, MD      . hydrALAZINE (APRESOLINE) tablet 50 mg  50 mg Oral Q8H Hayden Rasmussen, MD   50 mg at 11/06/19 0759  . loratadine (CLARITIN) tablet 10 mg  10 mg Oral Daily PRN Hayden Rasmussen, MD      . ondansetron [facility]) tablet 4 mg  4 mg Oral Q6H PRN Pahwani, Rinka R, MD       Or  . ondansetron (ZOFRAN) injection 4 mg  4 mg Intravenous Q6H PRN Pahwani, Rinka R, MD      . oxyCODONE (Oxy IR/ROXICODONE) immediate release tablet 5 mg  5 mg Oral Q6H PRN Hayden Rasmussen, MD      . pantoprazole sodium (PROTONIX) 40 mg/20 mL oral suspension 40 mg  40 mg Oral Daily Hayden Rasmussen, MD   40 mg at 11/06/19 1014  . polyethylene glycol (MIRALAX / GLYCOLAX) packet 17 g  17 g Oral Daily Hayden Rasmussen, MD   17 g at 11/06/19 1014  . sodium chloride flush (NS) 0.9 % injection 3 mL  3 mL Intravenous Once Hayden Rasmussen, MD      . zinc sulfate capsule 220 mg  220 mg Oral Daily Pahwani, Rinka R, MD       Current Outpatient Medications  Medication Sig Dispense Refill  . acetaminophen (TYLENOL) 500 MG tablet Take 500 mg by mouth every 6 (six) hours as needed for moderate pain.    Marland Kitchen amLODipine (NORVASC) 10 MG tablet Take 1 tablet (10 mg total) by mouth daily.    . benzocaine (ORAJEL) 10 % mucosal gel Use as directed 1 application in the mouth or throat 4 (four) times daily as needed for mouth pain. 5.3 g 0  . Blood Glucose Monitoring Suppl (BAYER CONTOUR MONITOR) w/Device KIT Use as directed for 3 times daily testing of blood glucose. E11.9 1 kit 0  . carvedilol (COREG) 12.5 MG tablet Take 1 tablet (12.5 mg total) by mouth 2 (two) times daily with a meal. 60 tablet 0  . Chlorhexidine  Gluconate Cloth 2 % PADS Apply 6 each topically daily.    . diphenhydrAMINE (BENADRYL) 25 MG tablet Take 25 mg by mouth once.    . ferrous sulfate 325 (65 FE) MG tablet Take 1 tablet (325 mg total) by mouth 2 (two) times daily with a meal. 60 tablet 0  . furosemide (LASIX) 40 MG tablet Take 1 tablet (40 mg total) by mouth daily. 30 tablet 0  . glucose blood (  BAYER CONTOUR TEST) test strip Use as instructed for 3 times daily testing of blood glucose. E11.9 100 each 12  . hydrALAZINE (APRESOLINE) 50 MG tablet Take 1 tablet (50 mg total) by mouth every 8 (eight) hours.    . INSULIN SYRINGE 1CC/29G 29G X 1/2" 1 ML MISC Insulin syringes 100 each 0  . Lancets (ONETOUCH ULTRASOFT) lancets Use as instructed 100 each 12  . levonorgestrel (MIRENA) 20 MCG/24HR IUD 1 Intra Uterine Device (1 each total) by Intrauterine route once for 1 dose. To be placed in the office 1 each 0  . loratadine (CLARITIN) 10 MG tablet Take 10 mg by mouth daily as needed for allergies.    Marland Kitchen oxyCODONE (OXY IR/ROXICODONE) 5 MG immediate release tablet Take 1 tablet (5 mg total) by mouth every 6 (six) hours as needed for moderate pain. 10 tablet 0  . pantoprazole sodium (PROTONIX) 40 mg/20 mL PACK Take 20 mLs (40 mg total) by mouth daily. 30 mL   . polyethylene glycol (MIRALAX / GLYCOLAX) 17 g packet Take 17 g by mouth daily. 14 each 0     Discharge Medications: Please see discharge summary for a list of discharge medications.  Relevant Imaging Results:  Relevant Lab Results:   Additional Information SS# [id number]; patient is COVID+  Janace Hoard, LCSW

## 2019-11-06 NOTE — Care Management (Signed)
ED CM spoke with Vanessa [name] at [facility] concerning patient. She states, patient was discharged because did not want to have a bill as she used her Medicare days.

## 2019-11-06 NOTE — ED Notes (Signed)
Patient is not able to ambulate at all; 3 person assist to tranfers patient to stretcher; patient is dead weight and not able to assist staff at all; patient a&Ox 4 on transfer to the floor and vitals stable; IV to be re consulted to gain access and blood upon arrival to the floor; pt transferred to floor on monitor in sinus rythme-Monique,RN

## 2019-11-06 NOTE — ED Notes (Signed)
Pt called out requesting if ED staff could help her get her second covid shot. sts "after the first dose I was in the nursing home so I didn't get the second one." Informed pt that couldn't be handled tonight but that a note would be made for the dayshift team could look into that. Pt encouraged to rest. Sts she will try.

## 2019-11-06 NOTE — TOC Initial Note (Signed)
Transition of Care [facility]) - Initial/Assessment Note    Patient Details  Name: Vanessa [name] MRN: [id number] Date of Birth: 1955-08-01  Transition of Care [facility]) CM/SW Contact:    Janace Hoard, LCSW Phone Number: 11/06/2019, 3:22 PM  Clinical Narrative:                 Patient is a 64 yr old female presenting to the ED for SNF placement. Per notes, patient was recently discharged from [facility]. While in the ED, patient tested positive for COVID prior to being seen by PT. This CSW did not get a chance to contact [facility] prior to patient being seen by PT. Due to patient having COVID symptoms, patient is to admitted to the medical floor.   CSW contacted patient's husband. Patient's husband reports he was made aware that patient tested positive for COVID and is to be tested himself.   This CSW completed FL2 and will fax out information to SNFs. Will note that patient is being admitted to facilities.   Expected Discharge Plan: Skilled Nursing Facility Barriers to Discharge: Continued Medical Work up(Patient tested positve for [place])   Patient Goals and CMS Choice Patient states their goals for this hospitalization and ongoing recovery are:: Going back to rehab      Expected Discharge Plan and Services Expected Discharge Plan: [place] In-house Referral: Clinical Social Work Discharge Planning Services: CM Consult Post Acute Care Choice: [place] Living arrangements for the past 2 months: [place]                                      Prior Living Arrangements/Services Living arrangements for the past 2 months: [place] Lives with:: Facility Resident Patient language and need for interpreter reviewed:: Yes Do you feel safe going back to the place where you live?: Yes      Need for Family Participation in Patient Care: No (Comment) Care giver support system in place?: Yes (comment) Current home  services: DME(Cane) Criminal Activity/Legal Involvement Pertinent to Current Situation/Hospitalization: No - Comment as needed  Activities of Daily Living      Permission Sought/Granted Permission sought to share information with : Family Supports, Chartered certified accountant granted to share information with : Yes, Verbal Permission Granted     Permission granted to share info w AGENCY: SNFs  Permission granted to share info w Relationship: [place] spouse [phone]     Emotional Assessment Appearance:: Other (Comment Required(unable to asses) Attitude/Demeanor/Rapport: Unable to Assess Affect (typically observed): Unable to Assess Orientation: : Oriented to Self, Oriented to Place, Oriented to  Time, Oriented to Situation Alcohol / Substance Use: Not Applicable Psych Involvement: No (comment)  Admission diagnosis:  Generalized weakness [R53.1] Patient Active Problem List   Diagnosis Date Noted  . CKD (chronic kidney disease) stage 3, GFR 30-59 ml/min   . Hypernatremia   . COVID-19 virus infection   . Generalized weakness   . Hypertensive urgency   . Allergic angioedema 10/19/2019  . Anasarca 10/18/2019  . Angioedema 10/18/2019  . Hypertensive emergency 10/18/2019  . Trichimoniasis 10/17/2019  . Macrocytic anemia 10/05/2019  . Esophageal dysmotility 10/04/2019  . Pressure injury of skin 09/22/2019  . Endometrial cancer ([place]) 09/21/2019  . Left humeral fracture 09/21/2019  . Iron deficiency anemia due to chronic blood loss 09/21/2019  . MSSA bacteremia 09/18/2019  .  Postmenopausal bleeding 09/15/2019  . Thrombocytopenia ([place]) 09/15/2019  . Generalized pruritus 09/15/2019  . Benign hypertension with CKD (chronic kidney disease) stage III 09/15/2019  . Hyperuricemia 09/15/2019  . Hypokalemia 09/15/2019  . Symptomatic anemia 09/14/2019  . Non compliance w medication regimen 03/24/2015  . Dyslipidemia 03/24/2015  . DM type 2 (diabetes mellitus, type 2)  ([place]) 05/23/2014  . HTN (hypertension) 05/23/2014   PCP:  Maren Reamer, MD (Inactive) Pharmacy:  No Pharmacies Listed    Social Determinants of Health (SDOH) Interventions    Readmission Risk Interventions Readmission Risk Prevention Plan 10/21/2019 09/25/2019  Transportation Screening Complete Complete  PCP or Specialist Appt within 3-5 Days - Not Complete  Not Complete comments - plan for SNF  [place] or [place] - Complete  Social Work Consult for [place] Planning/Counseling - Complete  Palliative Care Screening - Not Applicable  Medication Review (RN Care Manager) Referral to Pharmacy Referral to Pharmacy  PCP or Specialist appointment within 3-5 days of discharge Complete -  [place] or [place] (No Data) -  SW Recovery Care/Counseling Consult Complete -  Palliative Care Screening Not Applicable -  Skilled Nursing Facility Complete -  Some recent data might be hidden

## 2019-11-06 NOTE — Evaluation (Signed)
Physical Therapy Evaluation Patient Details Name: Vanessa [name] MRN: [id number] DOB: Dec 09, 1955 Today's Date: 11/06/2019   History of Present Illness  Pt is a 64 y/o female presenting secondary to inability to care for herself. Was recently d/c'd from SNF and has been unable to care for herself since d/c. Pt found to be COVID + upon presentation. PMH includes HTN, DM, CKD, and endometrial cancer.   Clinical Impression  Pt presenting with problem above and deficits below. Pt requiring max A to perform bed mobility tasks. Reporting increased pain in R thigh region, so unable to tolerate further mobility. Pt reports husband unable to assist as needed. Feel she would benefit from SNF level therapies at d/c to increase independence and safety with mobility. Reports she was just discharged from SNF. Will continue to follow acutely to maximize functional mobility independence and safety.     Follow Up Recommendations SNF;Supervision/Assistance - 24 hour    Equipment Recommendations  Wheelchair (measurements PT);Wheelchair cushion (measurements PT);Other (comment)(hoyer lift and pad)    Recommendations for Other Services       Precautions / Restrictions Precautions Precautions: Fall Restrictions Weight Bearing Restrictions: No      Mobility  Bed Mobility Overal bed mobility: Needs Assistance Bed Mobility: Supine to Sit;Sit to Supine     Supine to sit: Max assist Sit to supine: Max assist   General bed mobility comments: Max A and increased time to perform bed mobility tasks. Required assist for trunk and LEs. Further mobility deferred secondary to pain in R thigh   Transfers                    Ambulation/Gait                Stairs            Wheelchair Mobility    Modified Rankin (Stroke Patients Only)       Balance Overall balance assessment: Needs assistance Sitting-balance support: Bilateral upper extremity supported;Feet supported Sitting  balance-Leahy Scale: Poor Sitting balance - Comments: Reliant on BUE support to maintain sitting balance.                                      Pertinent Vitals/Pain Pain Assessment: Faces Faces Pain Scale: Hurts whole lot Pain Location: R thigh  Pain Descriptors / Indicators: Grimacing;Guarding Pain Intervention(s): Limited activity within patient's tolerance;Monitored during session;Repositioned    Home Living Family/patient expects to be discharged to:: Skilled nursing facility                 Additional Comments: Reports husband cannot assist her at home as he has cancer.     Prior Function Level of Independence: Needs assistance   Gait / Transfers Assistance Needed: Reports son has been having to help her since d/c from SNF. Usually ambulates household distances.   ADL's / Homemaking Assistance Needed: Reports son has been having to help her with ADLs since d/c from SNF         Hand Dominance        Extremity/Trunk Assessment   Upper Extremity Assessment Upper Extremity Assessment: Generalized weakness    Lower Extremity Assessment Lower Extremity Assessment: Generalized weakness;RLE deficits/detail RLE Deficits / Details: R thigh with increased pain     Cervical / Trunk Assessment Cervical / Trunk Assessment: Kyphotic  Communication   Communication: No difficulties  Cognition Arousal/Alertness: Awake/alert Behavior  During Therapy: WFL for tasks assessed/performed Overall Cognitive Status: No family/caregiver present to determine baseline cognitive functioning                                        General Comments      Exercises     Assessment/Plan    PT Assessment Patient needs continued PT services  PT Problem List Decreased strength;Decreased activity tolerance;Decreased mobility;Decreased knowledge of use of DME;Pain;Decreased balance;Decreased range of motion       PT Treatment Interventions Gait  training;Functional mobility training;Therapeutic activities;Balance training;Patient/family education;DME instruction;Therapeutic exercise    PT Goals (Current goals can be found in the Care Plan section)  Acute Rehab PT Goals Patient Stated Goal: to go to rehab PT Goal Formulation: With patient Time For Goal Achievement: 11/20/19 Potential to Achieve Goals: Fair    Frequency Min 3X/week   Barriers to discharge Decreased caregiver support      Co-evaluation               AM-PAC PT "6 Clicks" Mobility  Outcome Measure Help needed turning from your back to your side while in a flat bed without using bedrails?: A Lot Help needed moving from lying on your back to sitting on the side of a flat bed without using bedrails?: Total Help needed moving to and from a bed to a chair (including a wheelchair)?: Total Help needed standing up from a chair using your arms (e.g., wheelchair or bedside chair)?: Total Help needed to walk in hospital room?: Total Help needed climbing 3-5 steps with a railing? : Total 6 Click Score: 7    End of Session   Activity Tolerance: Patient limited by pain Patient left: in bed;with call bell/phone within reach Nurse Communication: Mobility status PT Visit Diagnosis: Unsteadiness on feet (R26.81);Difficulty in walking, not elsewhere classified (R26.2);Pain Pain - Right/Left: Right Pain - part of body: Leg    Time: 1950-9326 PT Time Calculation (min) (ACUTE ONLY): 24 min   Charges:   PT Evaluation $PT Eval Moderate Complexity: 1 Mod PT Treatments $Therapeutic Activity: 8-22 mins        Lou Miner, DPT  Acute Rehabilitation Services  Pager: [phone] Office: [phone]   Rudean Hitt 11/06/2019, 1:35 PM

## 2019-11-06 NOTE — H&P (Signed)
History and Physical    Vanessa [name] VVO:[id number] DOB: 01/10/1956 DOA: 11/05/2019  PCP: Maren Reamer, MD (Inactive)  Patient coming from: Home I have personally briefly reviewed patient's old medical records in [place]  Chief Complaint: Generalized weakness   HPI: Vanessa [name] is a 64 y.o. female with medical history significant of hypertension, diabetes, hyperlipidemia, grade 2 diastolic congestive heart failure, CKD stage IIIb, endometrial cancer presents to emergency department due to generalized weakness and lethargy.  Patient recently admitted on 4/9 due to acute onset angioedema/diffuse anasarca with questionable concurrent heart failure exacerbation versus new medication allergic reaction (metronidazole) which she was prescribed due to trichomonas noted on Pap smear.  Her labetalol was replaced with Coreg due to elevated blood pressure.  Lasix was added.  Patient discharged to SNF in stable condition on 10/21/2019.  She finished cefazolin IV every 12 hours for MSSA bacteremia on 10/31/2019.  Patient tells me that she discharged to home from SNF 2 days ago however she is unable to take care of her at home due to generalized weakness.  She said she is too weak to take care of herself at home.  Has chronic shortness of breath, bilateral lower extremity edema and cough.  No fever, runny nose, congestion, wheezing, COVID-19 exposure, headache, blurry vision, chest pain, decreased appetite, weight loss.  She recently diagnosed with endometrial cancer and being followed by Dr. Margurite Auerbach is not a good candidate for surgical treatment due to high risk secondary to recent MSSA bacteremia and symptomatic anemia in setting of GI bleed with notable duodenal ulcer on recent EGD.  In ED: Patient afebrile, blood pressure elevated in 200s over 100s.  Maintaining oxygen saturation on room air.  She tested positive for COVID-19.  Chest x-ray shows stable cardiomegaly with possible  central pulmonary vascular congestion.  Mild bibasilar subsegmental atelectasis is noted with small pleural effusions.  She  started on her home meds.  Case manager and PT consulted while patient was in ED.  [facility] hospitalist consulted for admission due to generalized weakness and SNF placement.  Review of Systems: As per HPI otherwise negative.    Past Medical History:  Diagnosis Date  . Anemia 10/09/2019  . Asthma   . CKD (chronic kidney disease) stage 3, GFR 30-59 ml/min   . Dental disease   . Diabetes mellitus without complication ([place])   . Endometrial cancer ([place])   . Esophageal dysmotility   . Heart murmur   . HLD (hyperlipidemia)   . Hypertension   . MSSA bacteremia 09/2019  . Obesity, Class III, BMI 40-49.9 (morbid obesity) ([place])   . Pressure ulcer     Past Surgical History:  Procedure Laterality Date  . BIOPSY  10/08/2019   Procedure: BIOPSY;  Surgeon: Clarene Essex, MD;  Location: [facility] ENDOSCOPY;  Service: Endoscopy;;  . ESOPHAGOGASTRODUODENOSCOPY N/A 10/08/2019   Procedure: ESOPHAGOGASTRODUODENOSCOPY (EGD);  Surgeon: Clarene Essex, MD;  Location: [place];  Service: Endoscopy;  Laterality: N/A;  . TYMPANOSTOMY TUBE PLACEMENT Bilateral      reports that she has quit smoking. She has never used smokeless tobacco. She reports current alcohol use. She reports that she does not use drugs.  No Known Allergies  Family History  Problem Relation Age of Onset  . Stroke Mother   . Diabetes Mother   . Hypertension Mother   . Cancer Mother        possible ovarian  . Diabetes Brother   . Hypertension Brother   .  Cancer Maternal Grandmother        possible ovarian    Prior to Admission medications   Medication Sig Start Date End Date Taking? Authorizing Provider  acetaminophen (TYLENOL) 500 MG tablet Take 500 mg by mouth every 6 (six) hours as needed for moderate pain.    [provider]  amLODipine (NORVASC) 10 MG tablet Take 1 tablet (10 mg total) by mouth daily.  10/11/19   Mikhail, Velta Addison, DO  benzocaine (ORAJEL) 10 % mucosal gel Use as directed 1 application in the mouth or throat 4 (four) times daily as needed for mouth pain. 09/25/19   Arrien, Jimmy Picket, MD  Blood Glucose Monitoring Suppl (BAYER CONTOUR MONITOR) w/Device KIT Use as directed for 3 times daily testing of blood glucose. E11.9 12/01/16   Maren Reamer, MD  carvedilol (COREG) 12.5 MG tablet Take 1 tablet (12.5 mg total) by mouth 2 (two) times daily with a meal. 10/21/19   Little Ishikawa, MD  Chlorhexidine Gluconate Cloth 2 % PADS Apply 6 each topically daily. 10/10/19   Mikhail, Velta Addison, DO  diphenhydrAMINE (BENADRYL) 25 MG tablet Take 25 mg by mouth once.    [provider]  ferrous sulfate 325 (65 FE) MG tablet Take 1 tablet (325 mg total) by mouth 2 (two) times daily with a meal. 09/25/19 11/05/19  Arrien, Jimmy Picket, MD  furosemide (LASIX) 40 MG tablet Take 1 tablet (40 mg total) by mouth daily. 10/21/19 11/20/19  Little Ishikawa, MD  glucose blood (BAYER CONTOUR TEST) test strip Use as instructed for 3 times daily testing of blood glucose. E11.9 12/01/16   Maren Reamer, MD  hydrALAZINE (APRESOLINE) 50 MG tablet Take 1 tablet (50 mg total) by mouth every 8 (eight) hours. 10/10/19   Mikhail, Velta Addison, DO  INSULIN SYRINGE 1CC/29G 29G X 1/2" 1 ML MISC Insulin syringes 03/06/14   Rolene Course, PA-C  Lancets [facility] ULTRASOFT) lancets Use as instructed 11/29/16   Maren Reamer, MD  levonorgestrel (MIRENA) 20 MCG/24HR IUD 1 Intra Uterine Device (1 each total) by Intrauterine route once for 1 dose. To be placed in the office 11/05/19 11/05/19  Joylene John D, NP  loratadine (CLARITIN) 10 MG tablet Take 10 mg by mouth daily as needed for allergies.    [provider]  oxyCODONE (OXY IR/ROXICODONE) 5 MG immediate release tablet Take 1 tablet (5 mg total) by mouth every 6 (six) hours as needed for moderate pain. 10/10/19   Mikhail, Velta Addison, DO  pantoprazole  sodium (PROTONIX) 40 mg/20 mL PACK Take 20 mLs (40 mg total) by mouth daily. 10/10/19   Mikhail, Velta Addison, DO  polyethylene glycol (MIRALAX / GLYCOLAX) 17 g packet Take 17 g by mouth daily. 09/25/19   Tawni Millers, MD    Physical Exam: Vitals:   11/05/19 2015 11/05/19 2030 11/05/19 2045 11/05/19 2230  BP: (!) 198/94 (!) 201/99 (!) 197/90 (!) 182/93  Pulse: 67 67 66 64  Resp: 16 18 18 19   Temp:      TempSrc:      SpO2: 96% 97% 97% 95%  Height:        Constitutional: NAD, calm, comfortable, sitting comfortably on the bed, on room air, communicating well Eyes: PERRL, lids and conjunctivae normal ENMT: Mucous membranes are moist. Posterior pharynx clear of any exudate or lesions.Normal dentition.  Neck: normal, supple, no masses, no thyromegaly Respiratory: clear to auscultation bilaterally, no wheezing, no crackles. Normal respiratory effort. No accessory muscle use.  Cardiovascular: Regular rate  and rhythm, no murmurs / rubs / gallops.  Bilateral 2+ pitting edema positive. 2+ pedal pulses. No carotid bruits.  Abdomen: no tenderness, no masses palpated. No hepatosplenomegaly. Bowel sounds positive.  Musculoskeletal: no clubbing / cyanosis. No joint deformity upper and lower extremities. Good ROM, no contractures. Normal muscle tone.  Skin: no rashes, lesions, ulcers. No induration Neurologic: CN 2-12 grossly intact. Sensation intact, DTR normal. Strength 5/5 in all 4.  Psychiatric: Normal judgment and insight. Alert and oriented x 3. Normal mood.    Labs on Admission: I have personally reviewed following labs and imaging studies  CBC: Recent Labs  Lab 11/05/19 1238 11/05/19 2129  WBC 5.8 5.4  NEUTROABS  --  4.6  HGB 8.7* 9.0*  HCT 29.8* 30.5*  MCV 101.7* 100.3*  PLT 189 644   Basic Metabolic Panel: Recent Labs  Lab 11/05/19 1238  NA 149*  K 3.6  CL 112*  CO2 24  GLUCOSE 117*  BUN 34*  CREATININE 2.15*  CALCIUM 8.7*   GFR: CrCl cannot be calculated (Unknown  ideal weight.). Liver Function Tests: No results for input(s): AST, ALT, ALKPHOS, BILITOT, PROT, ALBUMIN in the last 168 hours. No results for input(s): LIPASE, AMYLASE in the last 168 hours. No results for input(s): AMMONIA in the last 168 hours. Coagulation Profile: No results for input(s): INR, PROTIME in the last 168 hours. Cardiac Enzymes: No results for input(s): CKTOTAL, CKMB, CKMBINDEX, TROPONINI in the last 168 hours. BNP (last 3 results) No results for input(s): PROBNP in the last 8760 hours. HbA1C: No results for input(s): HGBA1C in the last 72 hours. CBG: No results for input(s): GLUCAP in the last 168 hours. Lipid Profile: No results for input(s): CHOL, HDL, LDLCALC, TRIG, CHOLHDL, LDLDIRECT in the last 72 hours. Thyroid Function Tests: No results for input(s): TSH, T4TOTAL, FREET4, T3FREE, THYROIDAB in the last 72 hours. Anemia Panel: No results for input(s): VITAMINB12, FOLATE, FERRITIN, TIBC, IRON, RETICCTPCT in the last 72 hours. Urine analysis:    Component Value Date/Time   COLORURINE YELLOW 11/06/2019 0828   APPEARANCEUR CLEAR 11/06/2019 0828   LABSPEC 1.010 11/06/2019 0828   PHURINE 6.0 11/06/2019 0828   GLUCOSEU NEGATIVE 11/06/2019 0828   HGBUR MODERATE (A) 11/06/2019 0828   BILIRUBINUR NEGATIVE 11/06/2019 0828   KETONESUR NEGATIVE 11/06/2019 0828   PROTEINUR 100 (A) 11/06/2019 0828   UROBILINOGEN 0.2 03/06/2014 1644   NITRITE NEGATIVE 11/06/2019 0828   LEUKOCYTESUR SMALL (A) 11/06/2019 0828    Radiological Exams on Admission: DG Chest Port 1 View  Result Date: 11/05/2019 CLINICAL DATA:  Weakness. Found on floor today. EXAM: PORTABLE CHEST 1 VIEW COMPARISON:  10/18/2019 FINDINGS: Stable heart size and mediastinal contours with mild cardiomegaly. Hazy opacity at both lung bases likely combination of pleural fluid and airspace disease/atelectasis. Vascular congestion. No pneumothorax. Left proximal humerus fracture appears subacute with surrounding  heterotopic ossification, also seen on 09/17/2019 shoulder radiograph. IMPRESSION: 1. Hazy opacity at both lung bases likely combination of pleural fluid and airspace disease/atelectasis. This is stable on the right but progressive on the left from prior. 2. Stable mild cardiomegaly.  Vascular congestion. Electronically Signed   By: Keith Rake M.D.   On: 11/05/2019 17:02    EKG: Independently reviewed.  Normal sinus rhythm, rightward axis deviation.  No ST elevation or depression.  Assessment/Plan Principal Problem:   Generalized weakness Active Problems:   DM type 2 (diabetes mellitus, type 2) (HCC)   Dyslipidemia   Endometrial cancer (HCC)   Macrocytic anemia  CKD (chronic kidney disease) stage 3, GFR 30-59 ml/min   Hypernatremia   COVID-19 virus infection   Hypertensive urgency   Generalized weakness: -Likely in the setting of physical deconditioning due to multiple chronic medical issues including endometrial adenocarcinoma, fluid overload secondary to diastolic CHF exacerbation along with COVID-19 infection  -Patient is afebrile with no leukocytosis.  Maintaining oxygen saturation on room air.  No upper respiratory symptoms. -Chest x-ray shows stable mild cardiomegaly, vascular congestion.  No pneumonia. -Admit patient on the floor.  On telemetry. -We will give Lasix 40 IV once and then continue home dose of Lasix 40 daily -Ordered inflammatory markers, procalcitonin and blood culture -We will hold IV antibiotics, steroids and remdesivir as of now. -Strict INO's and daily weight.  Monitor electrolytes closely.  Reviewed recent echo. -Consult PT/OT -On fall precautions.  Hypertension urgency: -Patient's blood pressure noted to be in 200s over 100s upon arrival to ED. -Current blood pressure is 182/93. -Likely secondary to medication noncompliance -Continue her home meds-amlodipine, Coreg, hydralazine -Monitor blood pressure closely.  Bilateral lower extremity  edema: -Likely in the setting of grade 2 diastolic CHF however patient is on increased risk of DVT due to history of endometrial adenocarcinoma and decrease mobility. -We will get Doppler ultrasound of bilateral lower extremity to rule out DVT.  Diabetes mellitus: Check A1c -Started patient on sliding scale insulin and monitor blood sugar closely.  History of duodenal ulcer: Continue PPI -Avoid NSAIDs.  Reviewed recent EGD  Macrocytic anemia: H&H is stable -Check TSH, B12, folate  CKD stage III: Stable -Continue to monitor  Hypernatremia: Sodium 149-checked on 11/05/2019 -Repeat BMP is pending  Endometrial adenocarcinoma: -Followed by GYN[facility] Dr. Berline Lopes outpatient -UA shows more than 50 RBCs likely due to vaginal bleeding. -Monitor H&H closely.  History of MSSA bacteremia: -Patient finished cefazolin on 10/31/2019   DVT prophylaxis: TED/SCD, no Lovenox due to vaginal bleeding  code Status: Full code-confirmed with the patient  family Communication: None present at bedside.  Plan of care discussed with patient in length and she verbalized understanding and agreed with it. Disposition Plan: To be determined Consults called: None Admission status: Inpatient   Mckinley Jewel MD [facility]  If 7PM-7AM, please contact night-coverage [email or web address] Password [facility]  11/06/2019, 3:16 PM

## 2019-11-06 NOTE — ED Notes (Signed)
Meal given

## 2019-11-06 NOTE — ED Provider Notes (Signed)
Emergency Medicine Observation Re-evaluation Note  Serria Sloma is a 64 y.o. , seen on rounds today.  Pt initially presented to the ED for complaints of increasing weakness, feeling sick and inability to care for self.  Currently, the patient is alert, with mild increased work of breathing but not requiring oxygen.  Patient tested positive for Covid.  Physical Exam  BP (!) 182/93   Pulse 64   Temp (!) 97.4 F (36.3 C) (Axillary)   Resp 19   Ht 5' (1.524 m)   LMP 03/17/2015   SpO2 95%   BMI 43.18 kg/m  Physical Exam Constitutional:      Comments: Alert sitting up in the bed.  Mild tachypnea.  Mental status clear.  Eyes:     Extraocular Movements: Extraocular movements intact.  Cardiovascular:     Rate and Rhythm: Normal rate and regular rhythm.  Pulmonary:     Comments: Breath sounds coarse.  Occasional expiratory wheeze. Abdominal:     Palpations: Abdomen is soft.  Musculoskeletal:        General: Normal range of motion.  Skin:    General: Skin is warm and dry.  Neurological:     General: No focal deficit present.     Mental Status: She is oriented to person, place, and time.     Cranial Nerves: No cranial nerve deficit.     Coordination: Coordination normal.  Psychiatric:        Mood and Affect: Mood normal.     ED Course / MDM  EKG:EKG Interpretation  Date/Time:  Tuesday November 05 2019 12:49:18 EDT Ventricular Rate:  64 PR Interval:  166 QRS Duration: 84 QT Interval:  454 QTC Calculation: 468 R Axis:   107 Text Interpretation: Normal sinus rhythm Rightward axis Cannot rule out Anterior infarct , age undetermined Abnormal ECG No significant change since 4/21 Confirmed by Aletta Edouard [phone]) on 11/05/2019 3:37:15 PM  Clinical Course as of Nov 05 1428  Tue Nov 05, 2019  1657 Chest x-ray interpreted by me as poor positioning probably no infiltrate.  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   [MB]  [facility] work  is seen the patient although he states can be very difficult to place her due to the fact she already was in rehab and left.   [MB]    Clinical Course User Index [MB] Hayden Rasmussen, MD   I have reviewed the labs performed to date as well as medications administered while in observation.  Recent changes in the last 24 hours include positive test for Covid. Plan  Current plan is for admission to medical service. Patient had been evaluated for possible placement to rehab facility.  Patient reports that the time she was discharged from rehab 2 days ago she was starting to feel sick at that time.  She reports when she got home she was too weak to care for herself.  She reports she does feel slightly short of breath and is having a cough.  Patient has tested positive for Covid and at this time plan will be for admission for failure of ADLs with inability to take care of herself at home and high risk for complications of Covid.   Charlesetta Shanks, MD 11/06/19 1435

## 2019-11-07 ENCOUNTER — Inpatient Hospital Stay (HOSPITAL_COMMUNITY): Payer: Medicare Other

## 2019-11-07 ENCOUNTER — Inpatient Hospital Stay: Payer: Self-pay

## 2019-11-07 DIAGNOSIS — R609 Edema, unspecified: Secondary | ICD-10-CM

## 2019-11-07 DIAGNOSIS — M7989 Other specified soft tissue disorders: Secondary | ICD-10-CM

## 2019-11-07 LAB — C-REACTIVE PROTEIN: CRP: 8.5 mg/dL — ABNORMAL HIGH (ref <1.0)

## 2019-11-07 LAB — CBC WITH DIFFERENTIAL/PLATELET
Abs Immature Granulocytes: 0.01 10*3/uL (ref 0.00–0.07)
Basophils Absolute: 0 10*3/uL (ref 0.0–0.1)
Basophils Relative: 1 %
Eosinophils Absolute: 0 10*3/uL (ref 0.0–0.5)
Eosinophils Relative: 0 %
HCT: 29.7 % — ABNORMAL LOW (ref 36.0–46.0)
Hemoglobin: 8.9 g/dL — ABNORMAL LOW (ref 12.0–15.0)
Immature Granulocytes: 0 %
Lymphocytes Relative: 6 %
Lymphs Abs: 0.3 10*3/uL — ABNORMAL LOW (ref 0.7–4.0)
MCH: 29.9 pg (ref 26.0–34.0)
MCHC: 30 g/dL (ref 30.0–36.0)
MCV: 99.7 fL (ref 80.0–100.0)
Monocytes Absolute: 0.2 10*3/uL (ref 0.1–1.0)
Monocytes Relative: 4 %
Neutro Abs: 4.4 10*3/uL (ref 1.7–7.7)
Neutrophils Relative %: 89 %
Platelets: 153 10*3/uL (ref 150–400)
RBC: 2.98 MIL/uL — ABNORMAL LOW (ref 3.87–5.11)
RDW: 18.6 % — ABNORMAL HIGH (ref 11.5–15.5)
WBC: 5 10*3/uL (ref 4.0–10.5)
nRBC: 0 % (ref 0.0–0.2)

## 2019-11-07 LAB — MAGNESIUM: Magnesium: 1.8 mg/dL (ref 1.7–2.4)

## 2019-11-07 LAB — CORTISOL: Cortisol, Plasma: 16.4 ug/dL

## 2019-11-07 LAB — PHOSPHORUS: Phosphorus: 4.2 mg/dL (ref 2.5–4.6)

## 2019-11-07 LAB — COMPREHENSIVE METABOLIC PANEL
ALT: 6 U/L (ref 0–44)
AST: 15 U/L (ref 15–41)
Albumin: 2.6 g/dL — ABNORMAL LOW (ref 3.5–5.0)
Alkaline Phosphatase: 103 U/L (ref 38–126)
Anion gap: 11 (ref 5–15)
BUN: 28 mg/dL — ABNORMAL HIGH (ref 8–23)
CO2: 26 mmol/L (ref 22–32)
Calcium: 8.5 mg/dL — ABNORMAL LOW (ref 8.9–10.3)
Chloride: 112 mmol/L — ABNORMAL HIGH (ref 98–111)
Creatinine, Ser: 1.76 mg/dL — ABNORMAL HIGH (ref 0.44–1.00)
GFR calc Af Amer: 35 mL/min — ABNORMAL LOW (ref >60)
GFR calc non Af Amer: 30 mL/min — ABNORMAL LOW (ref >60)
Glucose, Bld: 110 mg/dL — ABNORMAL HIGH (ref 70–99)
Potassium: 3.2 mmol/L — ABNORMAL LOW (ref 3.5–5.1)
Sodium: 149 mmol/L — ABNORMAL HIGH (ref 135–145)
Total Bilirubin: 0.8 mg/dL (ref 0.3–1.2)
Total Protein: 6.6 g/dL (ref 6.5–8.1)

## 2019-11-07 LAB — GLUCOSE, CAPILLARY
Glucose-Capillary: 98 mg/dL (ref 70–99)
Glucose-Capillary: 113 mg/dL — ABNORMAL HIGH (ref 70–99)
Glucose-Capillary: 108 mg/dL — ABNORMAL HIGH (ref 70–99)
Glucose-Capillary: 106 mg/dL — ABNORMAL HIGH (ref 70–99)

## 2019-11-07 LAB — T4, FREE: Free T4: 1.05 ng/dL (ref 0.61–1.12)

## 2019-11-07 LAB — PROCALCITONIN: Procalcitonin: <0.1 ng/mL

## 2019-11-07 LAB — HEPATITIS B SURFACE ANTIGEN: Hepatitis B Surface Ag: NONREACTIVE

## 2019-11-07 LAB — D-DIMER, QUANTITATIVE: D-Dimer, Quant: 1.31 ug/mL-FEU — ABNORMAL HIGH (ref 0.00–0.50)

## 2019-11-07 LAB — FERRITIN: Ferritin: 166 ng/mL (ref 11–307)

## 2019-11-07 MED ORDER — DEXAMETHASONE 4 MG PO TABS
4.0000 mg | ORAL_TABLET | Freq: Every day | ORAL | Status: DC
  Administered 2019-11-07: 12:00:00 4 mg via ORAL
  Filled 2019-11-07 (×2): qty 1

## 2019-11-07 MED ORDER — DEXAMETHASONE SODIUM PHOSPHATE 10 MG/ML IJ SOLN: 6.0000 mg | INTRAMUSCULAR | Status: DC

## 2019-11-07 MED ORDER — VANCOMYCIN HCL 2000 MG/400ML IV SOLN
2000.0000 mg | Freq: Once | INTRAVENOUS | Status: AC
  Administered 2019-11-07: 2000 mg via INTRAVENOUS
  Filled 2019-11-07: qty 400

## 2019-11-07 MED ORDER — POTASSIUM CHLORIDE CRYS ER 20 MEQ PO TBCR
40.0000 meq | EXTENDED_RELEASE_TABLET | Freq: Once | ORAL | Status: AC
  Administered 2019-11-07: 40 meq via ORAL
  Filled 2019-11-07: qty 2

## 2019-11-07 MED ORDER — DEXTROSE 5 % IV SOLN: INTRAVENOUS | Status: DC

## 2019-11-07 MED ORDER — SODIUM CHLORIDE 0.9 % IV SOLN
200.0000 mg | Freq: Once | INTRAVENOUS | Status: AC
  Administered 2019-11-07: 200 mg via INTRAVENOUS
  Filled 2019-11-07: qty 40

## 2019-11-07 MED ORDER — VANCOMYCIN HCL 1250 MG/250ML IV SOLN
1250.0000 mg | INTRAVENOUS | Status: DC
  Administered 2019-11-09: 13:00:00 1250 mg via INTRAVENOUS
  Filled 2019-11-07: qty 250

## 2019-11-07 MED ORDER — HYDRALAZINE HCL 50 MG PO TABS
100.0000 mg | ORAL_TABLET | Freq: Three times a day (TID) | ORAL | Status: DC
  Administered 2019-11-07 – 2019-11-14 (×21): 100 mg via ORAL
  Filled 2019-11-07 (×21): qty 2

## 2019-11-07 MED ORDER — PIPERACILLIN-TAZOBACTAM 3.375 G IVPB
3.3750 g | Freq: Three times a day (TID) | INTRAVENOUS | Status: DC
  Administered 2019-11-07 – 2019-11-11 (×10): 3.375 g via INTRAVENOUS
  Filled 2019-11-07 (×10): qty 50

## 2019-11-07 MED ORDER — SODIUM CHLORIDE 0.9% FLUSH
10.0000 mL | INTRAVENOUS | Status: DC | PRN
  Administered 2019-11-15: 10 mL

## 2019-11-07 MED ORDER — PIPERACILLIN-TAZOBACTAM 3.375 G IVPB 30 MIN
3.3750 g | Freq: Once | INTRAVENOUS | Status: AC
  Administered 2019-11-07: 3.375 g via INTRAVENOUS
  Filled 2019-11-07: qty 50

## 2019-11-07 MED ORDER — ORAL CARE MOUTH RINSE
15.0000 mL | Freq: Two times a day (BID) | OROMUCOSAL | Status: DC
  Administered 2019-11-07 – 2019-11-22 (×29): 15 mL via OROMUCOSAL

## 2019-11-07 MED ORDER — SODIUM CHLORIDE 0.9 % IV SOLN
100.0000 mg | Freq: Every day | INTRAVENOUS | Status: AC
  Administered 2019-11-08 – 2019-11-11 (×4): 100 mg via INTRAVENOUS
  Filled 2019-11-07 (×4): qty 20

## 2019-11-07 NOTE — Progress Notes (Signed)
   11/07/19 0020  Assess: MEWS Score  Temp (!) 93.8 F (34.3 C)  Assess: MEWS Score  MEWS Temp 2  MEWS Systolic 0  MEWS Pulse 0  MEWS RR 1  MEWS LOC 0  MEWS Score 3  MEWS Score Color Yellow  Assess: if the MEWS score is Yellow or Red  Were vital signs taken at a resting state? Yes  Focused Assessment Documented focused assessment  Early Detection of Sepsis Score *See Row Information* Medium  MEWS guidelines implemented *See Row Information* No, vital signs rechecked  Treat  MEWS Interventions Other (Comment) (Warm blankets and heat pack applied.)  Take Vital Signs  Increase Vital Sign Frequency  Yellow: Q 2hr X 2 then Q 4hr X 2, if remains yellow, continue Q 4hrs  Escalate  MEWS: Escalate Yellow: discuss with charge nurse/RN and consider discussing with provider and RRT  Notify: Charge Nurse/RN  Name of Charge Nurse/RN Notified Will Bonnet, RN  Date Charge Nurse/RN Notified 11/07/19  Time Charge Nurse/RN Notified 0026  Notify: Provider  Provider Name/Title Opyd, MD  Date Provider Notified 11/07/19  Time Provider Notified [phone]  Notification Type Page  Notification Reason Other (Comment) (YELLOW MEWS. Rectal temp 93.8.)  Response See new orders (Recheck in an hr. Notify if < 34 C.)  Date of Provider Response 11/07/19  Time of Provider Response 0030   Patient resting in bed. Patient is c/o being cold. Several blankets and heat pack applied. Room temp turned all the way up. No distress noted. Will continue to monitor.

## 2019-11-07 NOTE — Progress Notes (Signed)
   11/07/19 0135  Assess: MEWS Score  Temp (!) 94.1 F (34.5 C)  Assess: MEWS Score  MEWS Temp 2  MEWS Systolic 0  MEWS Pulse 0  MEWS RR 1  MEWS LOC 0  MEWS Score 3  MEWS Score Color Yellow  Assess: if the MEWS score is Yellow or Red  Were vital signs taken at a resting state? Yes  Early Detection of Sepsis Score *See Row Information* Medium  MEWS guidelines implemented *See Row Information* Yes  Treat  MEWS Interventions Other (Comment) (Bear Hugger ordered.)  Notify: Provider  Provider Name/Title Opyd, MD  Date Provider Notified 11/07/19  Time Provider Notified [phone]  Notification Type Page  Notification Reason Other (Comment) (Rectal temp 94.1 ith several blankets and heat pack applied.)  Response See new orders  Date of Provider Response 11/07/19  Time of Provider Response 0147   Warming blanket just arrived and applied to patient. Patient resting in bed at this time, no distress noted. Will continue to monitor.

## 2019-11-07 NOTE — TOC Progression Note (Signed)
Transition of Care [facility]) - Progression Note    Patient Details  Name: Addyson Traub MRN: [id number] Date of Birth: 09-16-1955  Transition of Care [facility]) CM/SW [place], LCSW Phone Number: 11/07/2019, 10:50 AM  Clinical Narrative:    CSW continuing to follow for SNF placement. Per [facility], they were willing to accept patient back but now cannot due to not being able to accept COVID patients. Patient elected not to apply for Medicaid. Only SNFs accepting patients with COVID are:  -[facility]: Requires 10 days of out-of-pocket copays up front (Minimum of $2850) if Medicare provides authorization -[facility]: Requiring 10 days of out-of-pocket copays up front  -[facility]: Sent referral for review   Expected Discharge Plan: [place] Barriers to Discharge: Continued Medical Work up(Patient tested positve for [employer])  Expected Discharge Plan and Services Expected Discharge Plan: [place] In-house Referral: Clinical Social Work Discharge Planning Services: CM Consult Post Acute Care Choice: [place] Living arrangements for the past 2 months: [place]                                       Social Determinants of Health (SDOH) Interventions    Readmission Risk Interventions Readmission Risk Prevention Plan 10/21/2019 09/25/2019  Transportation Screening Complete Complete  PCP or Specialist Appt within 3-5 Days - Not Complete  Not Complete comments - plan for SNF  [place] or [place] - Complete  Social Work Consult for [place] Planning/Counseling - Complete  Palliative Care Screening - Not Applicable  Medication Review Press photographer) Referral to Pharmacy Referral to Pharmacy  PCP or Specialist appointment within 3-5 days of discharge Complete -  [place] or [place] (No Data) -  SW Recovery Care/Counseling Consult Complete -  Palliative Care Screening Not Applicable -  Skilled  Nursing Facility Complete -  Some recent data might be hidden

## 2019-11-07 NOTE — Progress Notes (Signed)
PROGRESS NOTE                                                                                                                                                                                                             Patient Demographics:    Vanessa [name], is a 64 y.o. female, DOB - 03-13-1956, ESP:[id number]  Admit date - 11/05/2019   Admitting Physician Mckinley Jewel, MD  Outpatient Primary MD for the patient is Maren Reamer, MD (Inactive)  LOS - 1  CC - fever     Brief Narrative  Vanessa [name] is a 64 y.o. female with medical history significant of hypertension, diabetes, hyperlipidemia, grade 2 diastolic congestive heart failure, CKD stage IIIb, endometrial cancer presents to emergency department due to generalized weakness and lethargy, she was diagnosed with COVID-19 pneumonia with sepsis and hypothermia and admitted to the hospital.    She was recently hospitalized for MSSA bacteremia few weeks ago.  She also carries a history of endometrial cancer under the care of Dr. Berline Lopes GYN oncology deemed a poor surgical candidate for now, also recent GI bleed due to duodenal ulcer.   Subjective:    [name] today has, No headache, No chest pain, No abdominal pain - No Nausea, No new weakness tingling or numbness, No Cough - SOB. Feels weak.   Assessment  & Plan :     1.  Sepsis.  Etiology not entirely clear, she is hypothermic without leukocytosis, does have COVID-19 infection and mild pneumonia and could be septic from it however she recently had MSSA bacteremia few weeks ago.  At this point repeat blood cultures will be ordered, we will trend her procalcitonin and blood cultures closely on antibiotics.  Low-dose Decadron and remdesivir will be started as well.  Will check CT scan chest abdomen pelvis along with lower extremity venous duplex to look for source of sepsis.  2.  Endometrial cancer with intermittent bleeding and spotting.  Stable H&H, will type  screen, outpatient follow-up with [facility] oncologist Dr. Berline Lopes once discharged.  3.  History of duodenal ulcer with GI bleed.  Oral PPI, SCDs for now and monitor.  Follow with primary gastroenterologist post discharge.  4.  Borderline elevated D-dimer.  Could be due to her endometrial bleed history, lower extremity venous duplex ordered and pending.  Tough situation due to recent history of upper GI bleed with ongoing intermittent bleed from her vagina.  Avoiding chemical anticoagulation for now.  5.  Hypertension.  Continue combination of Norvasc, Coreg, increased hydralazine dose for better control.  6.  Intravascular dehydration with hypernatremia.  Gentle D5W and monitor.  7.  Hypothermia.  Likely due to sepsis.  Stable TSH and random cortisol, supportive care with bear hugger.    8.  Leg edema mostly nonpitting.  Monitor.    9.  Macrocytic anemia.  Anemia panel pending.    10. DM type II.  ISS.  Follow closely blood sugars as she is now on D5W and Decadron.  May require Lantus and adjustment of short-acting insulin.  Lab Results  Component Value Date   HGBA1C 5.7 (H) 09/15/2019   CBG (last 3)  Recent Labs    11/06/19 1751 11/06/19 2254 11/07/19 0848  GLUCAP 150* 112* 98      Condition.  Extremely guarded.  Explained to patient and husband.    Family Communication  :  husband Masterson [phone] on 11/07/19  Code Status :  Full  Disposition Plan  :    Status is: Inpatient  Remains inpatient appropriate because:Hemodynamically unstable   Dispo: The patient is from: SNF              Anticipated d/c is to: SNF              Anticipated d/c date is: > 3 days              Patient currently is not medically stable to d/c.   Consults  :  None  Procedures  :    DVT Prophylaxis  :  SCDs    Lab Results  Component Value Date   PLT 153 11/07/2019    Diet :  Diet Order            Diet heart healthy/carb modified Room service appropriate? Yes; Fluid consistency: Thin   Diet effective now               Inpatient Medications Scheduled Meds: . albuterol  2 puff Inhalation Q4H  . amLODipine  10 mg Oral Daily  . vitamin C  500 mg Oral Daily  . carvedilol  12.5 mg Oral BID WC  . Chlorhexidine Gluconate Cloth  6 each Topical Daily  . furosemide  40 mg Oral Daily  . hydrALAZINE  100 mg Oral Q8H  . insulin aspart  0-15 Units Subcutaneous TID WC  . insulin aspart  0-5 Units Subcutaneous QHS  . mouth rinse  15 mL Mouth Rinse BID  . pantoprazole sodium  40 mg Oral Daily  . polyethylene glycol  17 g Oral Daily  . sodium chloride flush  3 mL Intravenous Once  . zinc sulfate  220 mg Oral Daily   Continuous Infusions: . piperacillin-tazobactam (ZOSYN)  IV    . piperacillin-tazobactam    . [START ON 11/09/2019] vancomycin    . vancomycin     PRN Meds:.acetaminophen, chlorpheniramine-HYDROcodone, guaiFENesin-dextromethorphan, loratadine, [DISCONTINUED] ondansetron **OR** ondansetron (ZOFRAN) IV, oxyCODONE  Antibiotics  :   Anti-infectives (From admission, onward)   Start     Dose/Rate Route Frequency Ordered Stop   11/09/19 1200  vancomycin (VANCOREADY) IVPB 1250 mg/250 mL     1,250 mg 166.7 mL/hr over 90 Minutes Intravenous Every 48 hours 11/07/19 1100     11/07/19 1600  piperacillin-tazobactam (ZOSYN) IVPB 3.375 g     3.375 g 12.5 mL/hr over 240 Minutes Intravenous Every 8 hours 11/07/19 1033     11/07/19 1130  vancomycin (VANCOREADY) IVPB 2000 mg/400 mL  2,000 mg 200 mL/hr over 120 Minutes Intravenous  Once 11/07/19 1053     11/07/19 1100  piperacillin-tazobactam (ZOSYN) IVPB 3.375 g     3.375 g 100 mL/hr over 30 Minutes Intravenous  Once 11/07/19 1033            Objective:   Vitals:   11/07/19 0430 11/07/19 0510 11/07/19 0543 11/07/19 0824  BP:    (!) 180/82  Pulse: 63 65 65 70  Resp: 15 (!) 21 (!) 24 (!) 25  Temp:    (!) 97.4 F (36.3 C)  TempSrc:    Oral  SpO2: 94% 99% 97% 92%  Weight:      Height:        SpO2: 92 % O2 Flow  Rate (L/min): 1 L/min  Wt Readings from Last 3 Encounters:  11/06/19 100.1 kg  10/18/19 100.3 kg  10/17/19 94.9 kg     Intake/Output Summary (Last 24 hours) at 11/07/2019 1109 Last data filed at 11/07/2019 7253 Gross per 24 hour  Intake 240 ml  Output 800 ml  Net -560 ml     Physical Exam  Awake Alert, No new F.N deficits, Normal affect [place].AT,PERRAL Supple Neck,No JVD, No cervical lymphadenopathy appriciated.  Symmetrical Chest wall movement, Good air movement bilaterally, CTAB RRR,No Gallops,Rubs or new Murmurs, No Parasternal Heave +ve B.Sounds, Abd Soft, No tenderness, No organomegaly appriciated, No rebound - guarding or rigidity. No Cyanosis, Clubbing or edema, No new Rash or bruise     Data Review:    Recent Labs  Lab 11/05/19 1238 11/05/19 2129 11/07/19 0441  WBC 5.8 5.4 5.0  HGB 8.7* 9.0* 8.9*  HCT 29.8* 30.5* 29.7*  PLT 189 180 153  MCV 101.7* 100.3* 99.7  MCH 29.7 29.6 29.9  MCHC 29.2* 29.5* 30.0  RDW 19.1* 19.1* 18.6*  LYMPHSABS  --  0.3* 0.3*  MONOABS  --  0.4 0.2  EOSABS  --  0.0 0.0  BASOSABS  --  0.0 0.0    Recent Labs  Lab 11/05/19 1238 11/06/19 2140 11/07/19 0441  NA 149* 148* 149*  K 3.6 3.5 3.2*  CL 112* 112* 112*  CO2 24 26 26   GLUCOSE 117* 109* 110*  BUN 34* 28* 28*  CREATININE 2.15* 1.86* 1.76*  CALCIUM 8.7* 8.7* 8.5*  AST  --  18 15  ALT  --  5 6  ALKPHOS  --  113 103  BILITOT  --  0.6 0.8  ALBUMIN  --  2.8* 2.6*  MG  --   --  1.8  CRP  --  8.7* 8.5*  DDIMER  --  1.54* 1.31*  PROCALCITON  --  0.46 <0.10  TSH  --  5.158*  --   BNP  --  656.0*  --     Recent Labs  Lab 11/06/19 1054 11/06/19 2140 11/07/19 0441  CRP  --  8.7* 8.5*  DDIMER  --  1.54* 1.31*  BNP  --  656.0*  --   PROCALCITON  --  0.46 <0.10  SARSCOV2NAA POSITIVE*  --   --     ------------------------------------------------------------------------------------------------------------------ No results for input(s): CHOL, HDL, LDLCALC, TRIG, CHOLHDL,  LDLDIRECT in the last 72 hours.  Lab Results  Component Value Date   HGBA1C 5.7 (H) 09/15/2019   ------------------------------------------------------------------------------------------------------------------ Recent Labs    11/06/19 2140  TSH 5.158*   ------------------------------------------------------------------------------------------------------------------ Recent Labs    11/06/19 2140 11/07/19 0441  VITAMINB12 602  --   FERRITIN 172 166    Coagulation  profile No results for input(s): INR, PROTIME in the last 168 hours.  Recent Labs    11/06/19 2140 11/07/19 0441  DDIMER 1.54* 1.31*    Cardiac Enzymes No results for input(s): CKMB, TROPONINI, MYOGLOBIN in the last 168 hours.  Invalid input(s): CK ------------------------------------------------------------------------------------------------------------------    Component Value Date/Time   BNP 656.0 (H) 11/06/2019 2140    Micro Results Recent Results (from the past 240 hour(s))  Respiratory Panel by RT PCR (Flu A&B, Covid) - Nasopharyngeal Swab     Status: Abnormal   Collection Time: 11/06/19 10:54 AM   Specimen: Nasopharyngeal Swab  Result Value Ref Range Status   SARS Coronavirus 2 by RT PCR POSITIVE (A) NEGATIVE Final    Comment: RESULT CALLED TO, READ BACK BY AND VERIFIED WITH: Marzella Schlein RN 12:35 11/06/19 ([email or web address]) (NOTE) SARS-CoV-2 target nucleic acids are DETECTED. SARS-CoV-2 RNA is generally detectable in upper respiratory specimens  during the acute phase of infection. Positive results are indicative of the presence of the identified virus, but do not rule out bacterial infection or co-infection with other pathogens not detected by the test. Clinical correlation with patient history and other diagnostic information is necessary to determine patient infection status. The expected result is Negative. Fact Sheet for Patients:  PinkCheek.be Fact Sheet for  Healthcare Providers: GravelBags.it This test is not yet approved or cleared by the [place] FDA and  has been authorized for detection and/or diagnosis of SARS-CoV-2 by FDA under an Emergency Use Authorization (EUA).  This EUA will remain in effect (meaning this test can be used)  for the duration of  the COVID-19 declaration under Section 564(b)(1) of the Act, 21 U.S.C. section 360bbb-3(b)(1), unless the authorization is terminated or revoked sooner.    Influenza A by PCR NEGATIVE NEGATIVE Final   Influenza B by PCR NEGATIVE NEGATIVE Final    Comment: (NOTE) The Xpert Xpress SARS-CoV-2/FLU/RSV assay is intended as an aid in  the diagnosis of influenza from Nasopharyngeal swab specimens and  should not be used as a sole basis for treatment. Nasal washings and  aspirates are unacceptable for Xpert Xpress SARS-CoV-2/FLU/RSV  testing. Fact Sheet for Patients: PinkCheek.be Fact Sheet for Healthcare Providers: GravelBags.it This test is not yet approved or cleared by the [place] FDA and  has been authorized for detection and/or diagnosis of SARS-CoV-2 by  FDA under an Emergency Use Authorization (EUA). This EUA will remain  in effect (meaning this test can be used) for the duration of the  Covid-19 declaration under Section 564(b)(1) of the Act, 21  U.S.C. section 360bbb-3(b)(1), unless the authorization is  terminated or revoked. Performed at [facility], [place] [address], [place], [place] [address]   Culture, blood (Routine X 2) w Reflex to ID Panel     Status: None (Preliminary result)   Collection Time: 11/06/19  9:40 PM   Specimen: BLOOD  Result Value Ref Range Status   Specimen Description BLOOD RIGHT ANTECUBITAL  Final   Special Requests   Final    BOTTLES DRAWN AEROBIC AND ANAEROBIC Blood Culture adequate volume   Culture   Final    NO GROWTH < 12 HOURS Performed at [facility], [place] [address], [place], [place] [address]    Report Status PENDING  Incomplete  Culture, blood (Routine X 2) w Reflex to ID Panel     Status: None (Preliminary result)   Collection Time: 11/06/19  9:49 PM   Specimen: BLOOD  Result Value Ref  Range Status   Specimen Description BLOOD LEFT ANTECUBITAL  Final   Special Requests   Final    BOTTLES DRAWN AEROBIC ONLY Blood Culture adequate volume   Culture   Final    NO GROWTH < 12 HOURS Performed at [facility], [address], [place], [place] [address]    Report Status PENDING  Incomplete    Radiology Reports DG CHEST PORT 1 VIEW  Result Date: 11/06/2019 CLINICAL DATA:  Weakness. EXAM: PORTABLE CHEST 1 VIEW COMPARISON:  November 05, 2019. FINDINGS: Stable cardiomegaly with possible central pulmonary vascular congestion. No pneumothorax is noted. Mild bibasilar atelectasis is noted with small pleural effusions. Bony thorax is unremarkable. IMPRESSION: Stable cardiomegaly with possible central pulmonary vascular congestion. Mild bibasilar subsegmental atelectasis is noted with small pleural effusions. Electronically Signed   By: Marijo Conception M.D.   On: 11/06/2019 15:57   DG Chest Port 1 View  Result Date: 11/05/2019 CLINICAL DATA:  Weakness. Found on floor today. EXAM: PORTABLE CHEST 1 VIEW COMPARISON:  10/18/2019 FINDINGS: Stable heart size and mediastinal contours with mild cardiomegaly. Hazy opacity at both lung bases likely combination of pleural fluid and airspace disease/atelectasis. Vascular congestion. No pneumothorax. Left proximal humerus fracture appears subacute with surrounding heterotopic ossification, also seen on 09/17/2019 shoulder radiograph. IMPRESSION: 1. Hazy opacity at both lung bases likely combination of pleural fluid and airspace disease/atelectasis. This is stable on the right but progressive on the left from prior. 2. Stable mild cardiomegaly.  Vascular congestion. Electronically Signed   By:  Keith Rake M.D.   On: 11/05/2019 17:02   DG Chest Portable 1 View  Result Date: 10/18/2019 CLINICAL DATA:  Hypertension and shortness of breath EXAM: PORTABLE CHEST 1 VIEW COMPARISON:  September 17, 2019 FINDINGS: Ill-defined opacity in the right mid and lower lung regions noted with suspected small superimposed pleural effusion. There is mild left base atelectasis. Left lung otherwise clear. Heart is mildly enlarged with pulmonary vascularity normal. No adenopathy. There is degenerative change in the thoracic spine. IMPRESSION: Ill-defined opacity right mid and lower lung zones, likely due to a degree of pneumonia with superimposed small pleural effusion. Mild left base atelectasis. Stable cardiac prominence. No adenopathy evident. Electronically Signed   By: Lowella Grip III M.D.   On: 10/18/2019 14:06    Time Spent in minutes  30   Lala Lund M.D on 11/07/2019 at 11:09 AM  To page go to [email or web address] - password [facility]

## 2019-11-07 NOTE — Progress Notes (Signed)
Pharmacy Antibiotic Note  Vanessa [name] is a 64 y.o. female admitted on 11/05/2019 with generalized weakness in setting of physical deconditioning due to multiple chronic medical issues including endometrial adenocarcinoma, fluid overload secondary to diastolic CHF exacerbation along with COVID-19 infection  .  Pharmacy has been consulted on 11/07/19 for Vancomycin and Zosyn dosing for sepsis with aspiration pneumonia.  Plan: Zosyn 3.375 g IV x1 over 30 min, then 3.375 g IV q8h (EI) Vancomycin 2000mg  x1 then 1250 mg IV Q 48 hrs. Goal AUC 400-550. Expected AUC: 533.7,  SCr used: 1.76 , Vd 0.5L/kg (BMI 43) Monitor clinical progress, renal function. Check steady state vancomycin trough per protocol if needed.    Height: 5' (152.4 cm) Weight: 100.1 kg (220 lb 10.9 oz) IBW/kg (Calculated) : 45.5  Temp (24hrs), Avg:96.1 F (35.6 C), Min:93.8 F (34.3 C), Max:97.6 F (36.4 C)  Recent Labs  Lab 11/05/19 1238 11/05/19 2129 11/06/19 2140 11/07/19 0441  WBC 5.8 5.4  --  5.0  CREATININE 2.15*  --  1.86* 1.76*    Estimated Creatinine Clearance: 33.9 mL/min (A) (by C-G formula based on SCr of 1.76 mg/dL (H)).    No Known Allergies  Antimicrobials this admission: Vanc 4/29> Zosyn 4/29>  4/28 BCx x1:  ngtd <12 hr  Dose adjustments this admission:   Microbiology results: 4/28 Covid:  + Influenza neg  Thank you for allowing pharmacy to be a part of this patient's care.  Nicole Cella, RPh Clinical Pharmacist [phone] Please check AMION for all [place] phone numbers After 10:00 PM, call [place] [phone] 11/07/2019 11:00 AM

## 2019-11-07 NOTE — Progress Notes (Signed)
Occupational Therapy Evaluation Patient Details Name: Vanessa [name] MRN: [id number] DOB: 06-13-1956 Today's Date: 11/07/2019    History of Present Illness Pt is a 64 y/o female presenting secondary to inability to care for herself. Was recently d/c'd from SNF and has been unable to care for herself since d/c. Pt found to be COVID + upon presentation. PMH includes HTN, DM, CKD, and endometrial cancer.    Clinical Impression   Pt with recent hospital admissions and difficulty managing at home due to progressive weakness. Pt lives with spouse who is unable to physically assist pt at this time. Pt reports son has been coming to pt's home to assist with ADLs/transfers as needed. Pt received sitting EOB, pleasant and motivated to get out of bed. Pt overall Mod A + 2 for sit to stand in [place] from elevated bed surface. Pt displayed ability to maintain upright posture in [place] for > 10 seconds prior to transfer to recliner chair. Pt received on 1 L O2, 96% - removed for transfer with pt stats at 90! On RA. Pt did endorse some fatigue after task, but no SOB. Pt requires Max A for LB ADLs due to weakness. Based on pt's current functional abilities and decreased caregiver support (for physical assistance) at home, recommend SNF for short term rehab to maximize independence.     Follow Up Recommendations  SNF;Supervision/Assistance - 24 hour    Equipment Recommendations  Other (comment)(TBD)    Recommendations for Other Services       Precautions / Restrictions Precautions Precautions: Fall Restrictions Weight Bearing Restrictions: No      Mobility Bed Mobility Overal bed mobility: Needs Assistance             General bed mobility comments: sitting EOB on entry   Transfers Overall transfer level: Needs assistance   Transfers: Sit to/from Stand Sit to Stand: Mod assist;+2 physical assistance;From elevated surface         General transfer comment: Mod A x 2 for sit to stand with  [place] from elevated bed. Pt with shakiness noted in B LE. Pt transferred to recliner chair with use of [place]. Once in [place], pt demonstrated ability to maintain upright posture min guard for 10 seconds     Balance Overall balance assessment: Needs assistance Sitting-balance support: Bilateral upper extremity supported;Feet supported Sitting balance-Leahy Scale: Fair     Standing balance support: Bilateral upper extremity supported Standing balance-Leahy Scale: Poor Standing balance comment: standing in [place]                            ADL either performed or assessed with clinical judgement   ADL Overall ADL's : Needs assistance/impaired Eating/Feeding: Set up;Sitting   Grooming: Set up;Sitting   Upper Body Bathing: Moderate assistance;Sitting   Lower Body Bathing: Maximal assistance;Sit to/from stand   Upper Body Dressing : Sitting;Minimal assistance   Lower Body Dressing: Maximal assistance;Sit to/from stand   Toilet Transfer: +2 for physical assistance(Use of [place]edy)   Toileting- Water quality scientist and Hygiene: Maximal assistance;Sit to/from stand         General ADL Comments: Pt with decreased strength and endurance limiting ability to complete LB ADLs in standing without significant assistance.      Vision         Perception     Praxis      Pertinent Vitals/Pain Pain Assessment: Faces Faces Pain Scale: Hurts little more Pain Location: L LE Pain Descriptors /  Indicators: Grimacing;Guarding Pain Intervention(s): Other (comment)(RN present during session and aware)     Hand Dominance Right   Extremity/Trunk Assessment Upper Extremity Assessment Upper Extremity Assessment: Generalized weakness   Lower Extremity Assessment Lower Extremity Assessment: Defer to PT evaluation       Communication Communication Communication: No difficulties   Cognition Arousal/Alertness: Awake/alert Behavior During Therapy: WFL for tasks  assessed/performed Overall Cognitive Status: No family/caregiver present to determine baseline cognitive functioning                       Memory: Decreased short-term memory Following Commands: Follows one step commands consistently;Follows multi-step commands with increased time           General Comments  Pt received on 1 L O2, 96% at rest. Trialed n RA with pt stats at 90% after transfer to chair.     Exercises     Shoulder Instructions      Home Living Family/patient expects to be discharged to:: Skilled nursing facility Living Arrangements: Spouse/significant other Available Help at Discharge: Family;Available PRN/intermittently Type of Home: Apartment Home Access: Level entry     Home Layout: One level               Home Equipment: None   Additional Comments: Reports husband cannot assist her at home as he has cancer.       Prior Functioning/Environment Level of Independence: Needs assistance  Gait / Transfers Assistance Needed: Reports son has been having to help her since d/c from SNF. Usually ambulates household distances.  ADL's / Homemaking Assistance Needed: Reports son has been having to help her with ADLs since d/c from SNF             OT Problem List: Decreased strength;Decreased activity tolerance;Impaired balance (sitting and/or standing);Decreased knowledge of use of DME or AE;Cardiopulmonary status limiting activity      OT Treatment/Interventions: Self-care/ADL training;Therapeutic exercise;DME and/or AE instruction;Therapeutic activities;Cognitive remediation/compensation;Patient/family education;Balance training    OT Goals(Current goals can be found in the care plan section) Acute Rehab OT Goals Patient Stated Goal: get stronger after rehab OT Goal Formulation: With patient Time For Goal Achievement: 11/21/19 Potential to Achieve Goals: Good ADL Goals Pt Will Perform Upper Body Bathing: with supervision;sitting Pt Will  Perform Lower Body Bathing: with mod assist;sitting/lateral leans;sit to/from stand Pt Will Perform Upper Body Dressing: with supervision;sitting Pt Will Perform Lower Body Dressing: with mod assist;sitting/lateral leans;sit to/from stand Pt Will Transfer to Toilet: with mod assist;stand pivot transfer;with +2 assist;bedside commode Pt Will Perform Toileting - Clothing Manipulation and hygiene: with mod assist;sit to/from stand;sitting/lateral leans  OT Frequency: Min 2X/week   Barriers to D/C:            Co-evaluation              AM-PAC OT "6 Clicks" Daily Activity     Outcome Measure Help from another person eating meals?: A Little Help from another person taking care of personal grooming?: A Little Help from another person toileting, which includes using toliet, bedpan, or urinal?: A Lot Help from another person bathing (including washing, rinsing, drying)?: A Lot Help from another person to put on and taking off regular upper body clothing?: A Little Help from another person to put on and taking off regular lower body clothing?: A Lot 6 Click Score: 15   End of Session Equipment Utilized During Treatment: Gait belt Nurse Communication: Mobility status  Activity Tolerance: Patient tolerated treatment well;Patient limited  by fatigue Patient left: in chair;with call bell/phone within reach;with chair alarm set;Other (comment)(nursing in room )  OT Visit Diagnosis: Unsteadiness on feet (R26.81);Other abnormalities of gait and mobility (R26.89);History of falling (Z91.81);Muscle weakness (generalized) (M62.81)                Time: 2700-4849 OT Time Calculation (min): 17 min Charges:  OT General Charges $OT Visit: 1 Visit OT Evaluation $OT Eval Moderate Complexity: 1 Mod  Layla Maw, OTR/L  Layla Maw 11/07/2019, 2:08 PM

## 2019-11-07 NOTE — Progress Notes (Signed)
   11/07/19 0426  Assess: MEWS Score  Temp (!) 97.4 F (36.3 C)  BP (!) 153/77  Pulse Rate 61  ECG Heart Rate 61  Resp 10  SpO2 (!) 73 %  O2 Device Room Air  Patient Activity (if Appropriate) In bed  Assess: MEWS Score  MEWS Temp 0  MEWS Systolic 0  MEWS Pulse 0  MEWS RR 1  MEWS LOC 0  MEWS Score 1  MEWS Score Color Green  Notify: Provider  Provider Name/Title Opyd, MD  Date Provider Notified 11/07/19  Time Provider Notified [phone]  Notification Type Page  Notification Reason Other (Comment) (SpO2 dropped to 70s on RA while asleep)  Response No new orders   Patient resting in bed at this time, no distress noted. Will continue to monitor.

## 2019-11-07 NOTE — Progress Notes (Signed)
Peripherally Inserted Central Catheter Placement  The IV Nurse has discussed with the patient and/or persons authorized to consent for the patient, the purpose of this procedure and the potential benefits and risks involved with this procedure.  The benefits include less needle sticks, lab draws from the catheter, and the patient may be discharged home with the catheter. Risks include, but not limited to, infection, bleeding, blood clot (thrombus formation), and puncture of an artery; nerve damage and irregular heartbeat and possibility to perform a PICC exchange if needed/ordered by physician.  Alternatives to this procedure were also discussed.  Bard Power PICC patient education guide, fact sheet on infection prevention and patient information card has been provided to patient /or left at bedside.    PICC Placement Documentation  PICC Single Lumen 11/07/19 PICC Right Brachial 38 cm 0 cm (Active)  Indication for Insertion or Continuance of Line Prolonged intravenous therapies 11/07/19 1825  Exposed Catheter (cm) 0 cm 11/07/19 1825  Site Assessment Clean;Dry;Intact 11/07/19 1825  Line Status Flushed;Saline locked;Blood return noted 11/07/19 1825  Dressing Type Transparent 11/07/19 1825  Dressing Status Clean;Dry;Intact 11/07/19 1825  Dressing Intervention New dressing 11/07/19 1825  Dressing Change Due 11/14/19 11/07/19 1825       Priseis Cratty, Nicolette Bang 11/07/2019, 6:26 PM

## 2019-11-07 NOTE — Progress Notes (Signed)
Lower extremity venous has been completed.   Preliminary results in CV Proc.   Abram Sander 11/07/2019 2:12 PM

## 2019-11-08 ENCOUNTER — Inpatient Hospital Stay (HOSPITAL_COMMUNITY): Payer: Medicare Other

## 2019-11-08 LAB — CBC WITH DIFFERENTIAL/PLATELET
Abs Immature Granulocytes: 0.02 10*3/uL (ref 0.00–0.07)
Basophils Absolute: 0 10*3/uL (ref 0.0–0.1)
Basophils Relative: 0 %
Eosinophils Absolute: 0 10*3/uL (ref 0.0–0.5)
Eosinophils Relative: 0 %
HCT: 29.4 % — ABNORMAL LOW (ref 36.0–46.0)
Hemoglobin: 8.5 g/dL — ABNORMAL LOW (ref 12.0–15.0)
Immature Granulocytes: 0 %
Lymphocytes Relative: 5 %
Lymphs Abs: 0.2 10*3/uL — ABNORMAL LOW (ref 0.7–4.0)
MCH: 29.3 pg (ref 26.0–34.0)
MCHC: 28.9 g/dL — ABNORMAL LOW (ref 30.0–36.0)
MCV: 101.4 fL — ABNORMAL HIGH (ref 80.0–100.0)
Monocytes Absolute: 0.1 10*3/uL (ref 0.1–1.0)
Monocytes Relative: 2 %
Neutro Abs: 4.1 10*3/uL (ref 1.7–7.7)
Neutrophils Relative %: 93 %
Platelets: 142 10*3/uL — ABNORMAL LOW (ref 150–400)
RBC: 2.9 MIL/uL — ABNORMAL LOW (ref 3.87–5.11)
RDW: 18.5 % — ABNORMAL HIGH (ref 11.5–15.5)
WBC: 4.5 10*3/uL (ref 4.0–10.5)
nRBC: 0 % (ref 0.0–0.2)

## 2019-11-08 LAB — COMPREHENSIVE METABOLIC PANEL
ALT: 7 U/L (ref 0–44)
AST: 14 U/L — ABNORMAL LOW (ref 15–41)
Albumin: 2.4 g/dL — ABNORMAL LOW (ref 3.5–5.0)
Alkaline Phosphatase: 105 U/L (ref 38–126)
Anion gap: 11 (ref 5–15)
BUN: 31 mg/dL — ABNORMAL HIGH (ref 8–23)
CO2: 25 mmol/L (ref 22–32)
Calcium: 8.4 mg/dL — ABNORMAL LOW (ref 8.9–10.3)
Chloride: 111 mmol/L (ref 98–111)
Creatinine, Ser: 1.88 mg/dL — ABNORMAL HIGH (ref 0.44–1.00)
GFR calc Af Amer: 32 mL/min — ABNORMAL LOW (ref >60)
GFR calc non Af Amer: 28 mL/min — ABNORMAL LOW (ref >60)
Glucose, Bld: 188 mg/dL — ABNORMAL HIGH (ref 70–99)
Potassium: 3.9 mmol/L (ref 3.5–5.1)
Sodium: 147 mmol/L — ABNORMAL HIGH (ref 135–145)
Total Bilirubin: 0.8 mg/dL (ref 0.3–1.2)
Total Protein: 6.5 g/dL (ref 6.5–8.1)

## 2019-11-08 LAB — RETICULOCYTES
Immature Retic Fract: 9.9 % (ref 2.3–15.9)
RBC.: 2.96 MIL/uL — ABNORMAL LOW (ref 3.87–5.11)
Retic Count, Absolute: 36.1 10*3/uL (ref 19.0–186.0)
Retic Ct Pct: 1.2 % (ref 0.4–3.1)

## 2019-11-08 LAB — TYPE AND SCREEN
ABO/RH(D): O POS
Antibody Screen: NEGATIVE

## 2019-11-08 LAB — C-REACTIVE PROTEIN: CRP: 11.2 mg/dL — ABNORMAL HIGH (ref <1.0)

## 2019-11-08 LAB — IRON AND TIBC
Iron: 20 ug/dL — ABNORMAL LOW (ref 28–170)
Saturation Ratios: 10 % — ABNORMAL LOW (ref 10.4–31.8)
TIBC: 209 ug/dL — ABNORMAL LOW (ref 250–450)
UIBC: 189 ug/dL

## 2019-11-08 LAB — PROTIME-INR
INR: 1.2 (ref 0.8–1.2)
Prothrombin Time: 14.3 seconds (ref 11.4–15.2)

## 2019-11-08 LAB — VITAMIN B12: Vitamin B-12: 640 pg/mL (ref 180–914)

## 2019-11-08 LAB — FOLATE RBC
Folate, Hemolysate: 295 ng/mL
Folate, RBC: 1000 ng/mL (ref >498)
Hematocrit: 29.5 % — ABNORMAL LOW (ref 34.0–46.6)

## 2019-11-08 LAB — MAGNESIUM: Magnesium: 1.9 mg/dL (ref 1.7–2.4)

## 2019-11-08 LAB — D-DIMER, QUANTITATIVE: D-Dimer, Quant: 1.34 ug/mL-FEU — ABNORMAL HIGH (ref 0.00–0.50)

## 2019-11-08 LAB — FERRITIN: Ferritin: 170 ng/mL (ref 11–307)

## 2019-11-08 LAB — GLUCOSE, CAPILLARY
Glucose-Capillary: 149 mg/dL — ABNORMAL HIGH (ref 70–99)
Glucose-Capillary: 168 mg/dL — ABNORMAL HIGH (ref 70–99)
Glucose-Capillary: 207 mg/dL — ABNORMAL HIGH (ref 70–99)
Glucose-Capillary: 192 mg/dL — ABNORMAL HIGH (ref 70–99)

## 2019-11-08 LAB — PROCALCITONIN: Procalcitonin: <0.1 ng/mL

## 2019-11-08 LAB — BRAIN NATRIURETIC PEPTIDE: B Natriuretic Peptide: 573.1 pg/mL — ABNORMAL HIGH (ref 0.0–100.0)

## 2019-11-08 LAB — FOLATE: Folate: 11.4 ng/mL (ref >5.9)

## 2019-11-08 MED ORDER — FUROSEMIDE 40 MG PO TABS
40.0000 mg | ORAL_TABLET | Freq: Once | ORAL | Status: AC
  Administered 2019-11-08: 40 mg via ORAL
  Filled 2019-11-08: qty 1

## 2019-11-08 MED ORDER — DEXTROSE 5 % IV SOLN: INTRAVENOUS | Status: DC

## 2019-11-08 MED ORDER — DEXAMETHASONE SODIUM PHOSPHATE 10 MG/ML IJ SOLN
6.0000 mg | INTRAMUSCULAR | Status: DC
  Administered 2019-11-08 – 2019-11-10 (×3): 6 mg via INTRAVENOUS
  Filled 2019-11-08 (×3): qty 1

## 2019-11-08 MED ORDER — PRO-STAT SUGAR FREE PO LIQD
30.0000 mL | Freq: Three times a day (TID) | ORAL | Status: DC
  Administered 2019-11-08 – 2019-11-21 (×39): 30 mL via ORAL
  Filled 2019-11-08 (×39): qty 30

## 2019-11-08 NOTE — Progress Notes (Signed)
PROGRESS NOTE                                                                                                                                                                                                             Patient Demographics:    Vanessa [name], is a 64 y.o. female, DOB - 07-Mar-1956, XIP:[id number]  Admit date - 11/05/2019   Admitting Physician Mckinley Jewel, MD  Outpatient Primary MD for the patient is Maren Reamer, MD (Inactive)  LOS - 2  CC - fever     Brief Narrative  Vanessa [name] is a 64 y.o. female with medical history significant of hypertension, diabetes, hyperlipidemia, grade 2 diastolic congestive heart failure, CKD stage IIIb, endometrial cancer presents to emergency department due to generalized weakness and lethargy, she was diagnosed with COVID-19 pneumonia with sepsis and hypothermia and admitted to the hospital.    She was recently hospitalized for MSSA bacteremia few weeks ago.  She also carries a history of endometrial cancer under the care of Dr. Berline Lopes [facility] oncology deemed a poor surgical candidate for now, also recent GI bleed due to duodenal ulcer.   Subjective:   Patient in bed, appears comfortable, denies any headache, no fever, no chest pain or pressure, no shortness of breath , no abdominal pain. No focal weakness.   Assessment  & Plan :     1.  Sepsis.  Etiology not entirely clear, she is hypothermic without leukocytosis, does have COVID-19 infection and mild pneumonia and could be septic from it however she recently had MSSA bacteremia few weeks ago.  At this point repeat blood cultures will be ordered, we will trend her procalcitonin and blood cultures closely on antibiotics.  Low-dose Decadron and remdesivir will be started as well.  Unremarkable CT chest abdomen pelvis noncontrast and lower extremity leg ultrasound ruling out DVT.  Overall sepsis pathophysiology seems to have resolved, continue treatment for COVID-19 infection  along with empiric antibiotics and monitor cultures closely.  2.  Endometrial cancer with intermittent bleeding and spotting.  Stable H&H, will type screen, outpatient follow-up with [facility] oncologist Dr. Berline Lopes once discharged.  3.  History of duodenal ulcer with GI bleed.  Oral PPI, SCDs for now and monitor.  Follow with primary gastroenterologist post discharge.  4.  Borderline elevated D-dimer.  Could be due to her endometrial bleed history, negative lower extremity ultrasound.  Tough situation due to recent history of upper GI bleed with ongoing intermittent bleed from her vagina.  Avoiding chemical anticoagulation for now.  5.  Hypertension.  Continue combination of Norvasc, Coreg, increased hydralazine dose for better control.  6.  Intravascular dehydration with hypernatremia.  Gentle D5W and monitor.  7.  Hypothermia.  Likely due to sepsis.  Stable TSH and random cortisol, resolved after supportive care.    8.  Leg edema mostly nonpitting.  Monitor.    9.  Macrocytic anemia.  Anemia panel pending.    10. DM type II.  ISS.  Follow closely blood sugars as she is now on D5W and Decadron.  May require Lantus and adjustment of short-acting insulin.  Lab Results  Component Value Date   HGBA1C 5.7 (H) 09/15/2019   CBG (last 3)  Recent Labs    11/07/19 1622 11/07/19 2017 11/08/19 0737  GLUCAP 108* 106* 149*      Condition.  Extremely guarded.  Explained to patient and husband.    Family Communication  :  husband Pollet [phone] on 11/07/19  Code Status :  Full  Disposition Plan  :    Status is: Inpatient  Remains inpatient appropriate because:Hemodynamically unstable   Dispo: The patient is from: SNF              Anticipated d/c is to: SNF              Anticipated d/c date is: > 3 days              Patient currently is not medically stable to d/c.   Consults  :  None  Procedures  :    DVT Prophylaxis  :  SCDs    Lab Results  Component Value Date   PLT 142  (L) 11/08/2019    Diet :  Diet Order            Diet heart healthy/carb modified Room service appropriate? Yes; Fluid consistency: Thin  Diet effective now               Inpatient Medications Scheduled Meds: . albuterol  2 puff Inhalation Q4H  . amLODipine  10 mg Oral Daily  . vitamin C  500 mg Oral Daily  . carvedilol  12.5 mg Oral BID WC  . Chlorhexidine Gluconate Cloth  6 each Topical Daily  . dexamethasone (DECADRON) injection  6 mg Intravenous Q24H  . feeding supplement (PRO-STAT SUGAR FREE 64)  30 mL Oral TID WC  . furosemide  40 mg Oral Once  . hydrALAZINE  100 mg Oral Q8H  . insulin aspart  0-15 Units Subcutaneous TID WC  . insulin aspart  0-5 Units Subcutaneous QHS  . mouth rinse  15 mL Mouth Rinse BID  . pantoprazole sodium  40 mg Oral Daily  . polyethylene glycol  17 g Oral Daily  . sodium chloride flush  3 mL Intravenous Once  . zinc sulfate  220 mg Oral Daily   Continuous Infusions: . dextrose 75 mL/hr at 11/08/19 0835  . piperacillin-tazobactam (ZOSYN)  IV 3.375 g (11/08/19 0532)  . remdesivir 100 mg in NS 100 mL 100 mg (11/08/19 1057)  . [START ON 11/09/2019] vancomycin     PRN Meds:.acetaminophen, chlorpheniramine-HYDROcodone, guaiFENesin-dextromethorphan, loratadine, [DISCONTINUED] ondansetron **OR** ondansetron (ZOFRAN) IV, sodium chloride flush  Antibiotics  :   Anti-infectives (From admission, onward)   Start     Dose/Rate Route Frequency Ordered Stop   11/09/19 1200  vancomycin (VANCOREADY) IVPB 1250 mg/250 mL     1,250 mg 166.7 mL/hr over 90 Minutes Intravenous Every 48  hours 11/07/19 1100     11/08/19 1000  remdesivir 100 mg in sodium chloride 0.9 % 100 mL IVPB     100 mg 200 mL/hr over 30 Minutes Intravenous Daily 11/07/19 1217 11/12/19 0959   11/07/19 1600  piperacillin-tazobactam (ZOSYN) IVPB 3.375 g     3.375 g 12.5 mL/hr over 240 Minutes Intravenous Every 8 hours 11/07/19 1033     11/07/19 1245  remdesivir 200 mg in sodium chloride 0.9%  250 mL IVPB     200 mg 580 mL/hr over 30 Minutes Intravenous Once 11/07/19 1216 11/07/19 1728   11/07/19 1130  vancomycin (VANCOREADY) IVPB 2000 mg/400 mL     2,000 mg 200 mL/hr over 120 Minutes Intravenous  Once 11/07/19 1053 11/07/19 1432   11/07/19 1100  piperacillin-tazobactam (ZOSYN) IVPB 3.375 g     3.375 g 100 mL/hr over 30 Minutes Intravenous  Once 11/07/19 1033 11/07/19 1303          Objective:   Vitals:   11/08/19 0357 11/08/19 0358 11/08/19 0500 11/08/19 0740  BP: (!) 150/74 (!) 150/74  (!) 148/84  Pulse: (!) 57   60  Resp: 20   16  Temp:  97.8 F (36.6 C)  97.9 F (36.6 C)  TempSrc:  Axillary  Axillary  SpO2: 94%   94%  Weight:   104.9 kg   Height:        SpO2: 94 % O2 Flow Rate (L/min): 2 L/min  Wt Readings from Last 3 Encounters:  11/08/19 104.9 kg  10/18/19 100.3 kg  10/17/19 94.9 kg     Intake/Output Summary (Last 24 hours) at 11/08/2019 1132 Last data filed at 11/08/2019 1000 Gross per 24 hour  Intake 995.24 ml  Output 1150 ml  Net -154.76 ml     Physical Exam  Awake Alert, No new F.N deficits, Normal affect [place].AT,PERRAL Supple Neck,No JVD, No cervical lymphadenopathy appriciated.  Symmetrical Chest wall movement, Good air movement bilaterally, CTAB RRR,No Gallops, Rubs or new Murmurs, No Parasternal Heave +ve B.Sounds, Abd Soft, No tenderness, No organomegaly appriciated, No rebound - guarding or rigidity. No Cyanosis, trace edema, No new Rash or bruise    Data Review:    Recent Labs  Lab 11/05/19 1238 11/05/19 2129 11/07/19 0441 11/08/19 0437  WBC 5.8 5.4 5.0 4.5  HGB 8.7* 9.0* 8.9* 8.5*  HCT 29.8* 30.5* 29.7* 29.4*  PLT 189 180 153 142*  MCV 101.7* 100.3* 99.7 101.4*  MCH 29.7 29.6 29.9 29.3  MCHC 29.2* 29.5* 30.0 28.9*  RDW 19.1* 19.1* 18.6* 18.5*  LYMPHSABS  --  0.3* 0.3* 0.2*  MONOABS  --  0.4 0.2 0.1  EOSABS  --  0.0 0.0 0.0  BASOSABS  --  0.0 0.0 0.0    Recent Labs  Lab 11/05/19 1238 11/06/19 2140  11/07/19 0441 11/08/19 0437 11/08/19 0500  NA 149* 148* 149* 147*  --   K 3.6 3.5 3.2* 3.9  --   CL 112* 112* 112* 111  --   CO2 24 26 26 25   --   GLUCOSE 117* 109* 110* 188*  --   BUN 34* 28* 28* 31*  --   CREATININE 2.15* 1.86* 1.76* 1.88*  --   CALCIUM 8.7* 8.7* 8.5* 8.4*  --   AST  --  18 15 14*  --   ALT  --  5 6 7   --   ALKPHOS  --  113 103 105  --   BILITOT  --  0.6 0.8  0.8  --   ALBUMIN  --  2.8* 2.6* 2.4*  --   MG  --   --  1.8 1.9  --   CRP  --  8.7* 8.5* 11.2*  --   DDIMER  --  1.54* 1.31* 1.34*  --   PROCALCITON  --  0.46 <0.10 <0.10  --   INR  --   --   --  1.2  --   TSH  --  5.158*  --   --   --   BNP  --  656.0*  --   --  573.1*    Recent Labs  Lab 11/06/19 1054 11/06/19 2140 11/07/19 0441 11/08/19 0437 11/08/19 0500  CRP  --  8.7* 8.5* 11.2*  --   DDIMER  --  1.54* 1.31* 1.34*  --   BNP  --  656.0*  --   --  573.1*  PROCALCITON  --  0.46 <0.10 <0.10  --   SARSCOV2NAA POSITIVE*  --   --   --   --     ------------------------------------------------------------------------------------------------------------------ No results for input(s): CHOL, HDL, LDLCALC, TRIG, CHOLHDL, LDLDIRECT in the last 72 hours.  Lab Results  Component Value Date   HGBA1C 5.7 (H) 09/15/2019   ------------------------------------------------------------------------------------------------------------------ Recent Labs    11/06/19 2140  TSH 5.158*   ------------------------------------------------------------------------------------------------------------------ Recent Labs    11/06/19 2140 11/07/19 0441  VITAMINB12 602  --   FERRITIN 172 166    Coagulation profile Recent Labs  Lab 11/08/19 0437  INR 1.2    Recent Labs    11/07/19 0441 11/08/19 0437  DDIMER 1.31* 1.34*    Cardiac Enzymes No results for input(s): CKMB, TROPONINI, MYOGLOBIN in the last 168 hours.  Invalid input(s):  CK ------------------------------------------------------------------------------------------------------------------    Component Value Date/Time   BNP 573.1 (H) 11/08/2019 0500    Micro Results Recent Results (from the past 240 hour(s))  Respiratory Panel by RT PCR (Flu A&B, Covid) - Nasopharyngeal Swab     Status: Abnormal   Collection Time: 11/06/19 10:54 AM   Specimen: Nasopharyngeal Swab  Result Value Ref Range Status   SARS Coronavirus 2 by RT PCR POSITIVE (A) NEGATIVE Final    Comment: RESULT CALLED TO, READ BACK BY AND VERIFIED WITH: Marzella Schlein RN 12:35 11/06/19 ([email or web address]) (NOTE) SARS-CoV-2 target nucleic acids are DETECTED. SARS-CoV-2 RNA is generally detectable in upper respiratory specimens  during the acute phase of infection. Positive results are indicative of the presence of the identified virus, but do not rule out bacterial infection or co-infection with other pathogens not detected by the test. Clinical correlation with patient history and other diagnostic information is necessary to determine patient infection status. The expected result is Negative. Fact Sheet for Patients:  PinkCheek.be Fact Sheet for Healthcare Providers: GravelBags.it This test is not yet approved or cleared by the [place] FDA and  has been authorized for detection and/or diagnosis of SARS-CoV-2 by FDA under an Emergency Use Authorization (EUA).  This EUA will remain in effect (meaning this test can be used)  for the duration of  the COVID-19 declaration under Section 564(b)(1) of the Act, 21 U.S.C. section 360bbb-3(b)(1), unless the authorization is terminated or revoked sooner.    Influenza A by PCR NEGATIVE NEGATIVE Final   Influenza B by PCR NEGATIVE NEGATIVE Final    Comment: (NOTE) The Xpert Xpress SARS-CoV-2/FLU/RSV assay is intended as an aid in  the diagnosis of influenza from Nasopharyngeal swab specimens and  should  not be  used as a sole basis for treatment. Nasal washings and  aspirates are unacceptable for Xpert Xpress SARS-CoV-2/FLU/RSV  testing. Fact Sheet for Patients: PinkCheek.be Fact Sheet for Healthcare Providers: GravelBags.it This test is not yet approved or cleared by the [place] FDA and  has been authorized for detection and/or diagnosis of SARS-CoV-2 by  FDA under an Emergency Use Authorization (EUA). This EUA will remain  in effect (meaning this test can be used) for the duration of the  Covid-19 declaration under Section 564(b)(1) of the Act, 21  U.S.C. section 360bbb-3(b)(1), unless the authorization is  terminated or revoked. Performed at [facility], [place] [address], [place], [place] [address]   Culture, blood (Routine X 2) w Reflex to ID Panel     Status: None (Preliminary result)   Collection Time: 11/06/19  9:40 PM   Specimen: BLOOD  Result Value Ref Range Status   Specimen Description BLOOD RIGHT ANTECUBITAL  Final   Special Requests   Final    BOTTLES DRAWN AEROBIC AND ANAEROBIC Blood Culture adequate volume   Culture   Final    NO GROWTH < 12 HOURS Performed at [facility], [place] [address] [place], [place] [address]    Report Status PENDING  Incomplete  Culture, blood (Routine X 2) w Reflex to ID Panel     Status: None (Preliminary result)   Collection Time: 11/06/19  9:49 PM   Specimen: BLOOD  Result Value Ref Range Status   Specimen Description BLOOD LEFT ANTECUBITAL  Final   Special Requests   Final    BOTTLES DRAWN AEROBIC ONLY Blood Culture adequate volume   Culture   Final    NO GROWTH < 12 HOURS Performed at [facility], [place] [address] [place], [place] [address]    Report Status PENDING  Incomplete    Radiology Reports CT ABDOMEN PELVIS WO CONTRAST  Result Date: 11/07/2019 CLINICAL DATA:  Generalized weakness and lethargy. Sepsis. Ascites. Cough. COVID positive  yesterday. EXAM: CT CHEST, ABDOMEN AND PELVIS WITHOUT CONTRAST TECHNIQUE: Multidetector CT imaging of the chest, abdomen and pelvis was performed following the standard protocol without IV contrast. COMPARISON:  Radiograph yesterday. Abdominopelvic CT 09/15/2019 FINDINGS: CT CHEST FINDINGS Cardiovascular: Right upper extremity PICC with tip in the SVC. Motion artifact partially obscures detailed evaluation. Normal caliber thoracic aorta. Mild atherosclerosis. Mild cardiomegaly. No pericardial effusion. Mediastinum/Nodes: Motion and lack of contrast limits assessment for adenopathy. Small mediastinal lymph nodes not definitively enlarged. Assessment for hilar adenopathy is significantly limited. Esophagus is patulous. No suspicious thyroid nodule. Lungs/Pleura: Moderate right and small left pleural effusion. Adjacent compressive atelectasis. There are multifocal geographic patchy ground-glass opacities throughout both lungs. Trachea and mainstem bronchi are grossly patent, partially motion obscured. Musculoskeletal: Generalized subcutaneous edema throughout the chest wall. Diffuse degenerative change throughout the thoracic spine. There are no acute or suspicious osseous abnormalities. CT ABDOMEN PELVIS FINDINGS Hepatobiliary: No evidence of focal hepatic abnormality on noncontrast exam. Liver is prominent size spanning 20 cm cranial caudal. Question of nodular hepatic contours. Hyperdense gallbladder contents. No calcified gallstone. Common bile duct is not well-defined. Pancreas: No pancreatic ductal dilatation. No disproportionate peripancreatic edema. Spleen: Normal in size without focal abnormality. Adrenals/Urinary Tract: Adrenal thickening without dominant nodule. Chronic left renal atrophy. Left ureter is nondilated, unchanged 7 mm calcification in the left pelvis equivocally within the ureter. Right kidney appears normal in size. No hydronephrosis or renal calculi. Urinary bladder is distended and  unremarkable. Stomach/Bowel: Bowel evaluation is  limited in the absence of contrast, presence of ascites, and motion artifact. Nondistended stomach, grossly unremarkable. No small bowel obstruction. Appendix not visualized. Moderate volume of stool throughout the colon. There is mild colonic redundancy. No obvious colonic wall thickening or pericolonic inflammation. Vascular/Lymphatic: Abdominal aorta is normal in caliber. No bulky abdominopelvic adenopathy. Reproductive: Uterus is slightly bulky. Limited assessment for adnexal mass given pelvic ascites. Other: Small to moderate volume abdominopelvic ascites. There is generalized edema of the intra-abdominal fat. Prominent generalized edema of the subcutaneous fat, confluent in the flanks. No free intra-abdominal air. Musculoskeletal: Degenerative change in the lumbar spine. There are no acute or suspicious osseous abnormalities. IMPRESSION: 1. Multifocal geographic patchy ground-glass opacities throughout both lungs, consistent with COVID-19 pneumonia. 2. Fluid overload. Moderate right and small left pleural effusion. Small to moderate volume abdominopelvic ascites. Generalized subcutaneous edema of the chest and abdomen consistent with third-spacing, confluent edema in the flanks. 3. Question of nodular hepatic contours, recommend correlation for cirrhosis. 4. Chronic left renal atrophy. Unchanged 7 mm calcification in the left pelvis equivocally within the ureter. No hydronephrosis. Aortic Atherosclerosis (ICD10-I70.0). Electronically Signed   By: Keith Rake M.D.   On: 11/07/2019 23:00   CT CHEST WO CONTRAST  Result Date: 11/07/2019 CLINICAL DATA:  Generalized weakness and lethargy. Sepsis. Ascites. Cough. COVID positive yesterday. EXAM: CT CHEST, ABDOMEN AND PELVIS WITHOUT CONTRAST TECHNIQUE: Multidetector CT imaging of the chest, abdomen and pelvis was performed following the standard protocol without IV contrast. COMPARISON:  Radiograph yesterday.  Abdominopelvic CT 09/15/2019 FINDINGS: CT CHEST FINDINGS Cardiovascular: Right upper extremity PICC with tip in the SVC. Motion artifact partially obscures detailed evaluation. Normal caliber thoracic aorta. Mild atherosclerosis. Mild cardiomegaly. No pericardial effusion. Mediastinum/Nodes: Motion and lack of contrast limits assessment for adenopathy. Small mediastinal lymph nodes not definitively enlarged. Assessment for hilar adenopathy is significantly limited. Esophagus is patulous. No suspicious thyroid nodule. Lungs/Pleura: Moderate right and small left pleural effusion. Adjacent compressive atelectasis. There are multifocal geographic patchy ground-glass opacities throughout both lungs. Trachea and mainstem bronchi are grossly patent, partially motion obscured. Musculoskeletal: Generalized subcutaneous edema throughout the chest wall. Diffuse degenerative change throughout the thoracic spine. There are no acute or suspicious osseous abnormalities. CT ABDOMEN PELVIS FINDINGS Hepatobiliary: No evidence of focal hepatic abnormality on noncontrast exam. Liver is prominent size spanning 20 cm cranial caudal. Question of nodular hepatic contours. Hyperdense gallbladder contents. No calcified gallstone. Common bile duct is not well-defined. Pancreas: No pancreatic ductal dilatation. No disproportionate peripancreatic edema. Spleen: Normal in size without focal abnormality. Adrenals/Urinary Tract: Adrenal thickening without dominant nodule. Chronic left renal atrophy. Left ureter is nondilated, unchanged 7 mm calcification in the left pelvis equivocally within the ureter. Right kidney appears normal in size. No hydronephrosis or renal calculi. Urinary bladder is distended and unremarkable. Stomach/Bowel: Bowel evaluation is limited in the absence of contrast, presence of ascites, and motion artifact. Nondistended stomach, grossly unremarkable. No small bowel obstruction. Appendix not visualized. Moderate volume of  stool throughout the colon. There is mild colonic redundancy. No obvious colonic wall thickening or pericolonic inflammation. Vascular/Lymphatic: Abdominal aorta is normal in caliber. No bulky abdominopelvic adenopathy. Reproductive: Uterus is slightly bulky. Limited assessment for adnexal mass given pelvic ascites. Other: Small to moderate volume abdominopelvic ascites. There is generalized edema of the intra-abdominal fat. Prominent generalized edema of the subcutaneous fat, confluent in the flanks. No free intra-abdominal air. Musculoskeletal: Degenerative change in the lumbar spine. There are no acute or suspicious osseous abnormalities. IMPRESSION: 1. Multifocal geographic patchy  ground-glass opacities throughout both lungs, consistent with COVID-19 pneumonia. 2. Fluid overload. Moderate right and small left pleural effusion. Small to moderate volume abdominopelvic ascites. Generalized subcutaneous edema of the chest and abdomen consistent with third-spacing, confluent edema in the flanks. 3. Question of nodular hepatic contours, recommend correlation for cirrhosis. 4. Chronic left renal atrophy. Unchanged 7 mm calcification in the left pelvis equivocally within the ureter. No hydronephrosis. Aortic Atherosclerosis (ICD10-I70.0). Electronically Signed   By: Keith Rake M.D.   On: 11/07/2019 23:00   DG Chest Port 1 View  Result Date: 11/08/2019 CLINICAL DATA:  Shortness of breath, COVID-19, diabetes mellitus, hypertension, stage III chronic kidney disease, history endometrial cancer EXAM: PORTABLE CHEST 1 VIEW COMPARISON:  Portable exam 0816 hours compared to 11/06/2019 FINDINGS: RIGHT arm PICC line tip projects over SVC. Enlargement of cardiac silhouette with stable mediastinal contours. Extensive BILATERAL pulmonary infiltrates greatest perihilar on RIGHT, question asymmetric pulmonary edema versus multifocal infection. No pleural effusion or pneumothorax. Bones demineralized with scattered  degenerative changes of the thoracic spine. IMPRESSION: Enlargement of cardiac silhouette with increased BILATERAL pulmonary infiltrates greatest RIGHT perihilar question asymmetric pulmonary edema versus multifocal pneumonia. Electronically Signed   By: Lavonia Dana M.D.   On: 11/08/2019 08:51   DG CHEST PORT 1 VIEW  Result Date: 11/06/2019 CLINICAL DATA:  Weakness. EXAM: PORTABLE CHEST 1 VIEW COMPARISON:  November 05, 2019. FINDINGS: Stable cardiomegaly with possible central pulmonary vascular congestion. No pneumothorax is noted. Mild bibasilar atelectasis is noted with small pleural effusions. Bony thorax is unremarkable. IMPRESSION: Stable cardiomegaly with possible central pulmonary vascular congestion. Mild bibasilar subsegmental atelectasis is noted with small pleural effusions. Electronically Signed   By: Marijo Conception M.D.   On: 11/06/2019 15:57   DG Chest Port 1 View  Result Date: 11/05/2019 CLINICAL DATA:  Weakness. Found on floor today. EXAM: PORTABLE CHEST 1 VIEW COMPARISON:  10/18/2019 FINDINGS: Stable heart size and mediastinal contours with mild cardiomegaly. Hazy opacity at both lung bases likely combination of pleural fluid and airspace disease/atelectasis. Vascular congestion. No pneumothorax. Left proximal humerus fracture appears subacute with surrounding heterotopic ossification, also seen on 09/17/2019 shoulder radiograph. IMPRESSION: 1. Hazy opacity at both lung bases likely combination of pleural fluid and airspace disease/atelectasis. This is stable on the right but progressive on the left from prior. 2. Stable mild cardiomegaly.  Vascular congestion. Electronically Signed   By: Keith Rake M.D.   On: 11/05/2019 17:02   DG Chest Portable 1 View  Result Date: 10/18/2019 CLINICAL DATA:  Hypertension and shortness of breath EXAM: PORTABLE CHEST 1 VIEW COMPARISON:  September 17, 2019 FINDINGS: Ill-defined opacity in the right mid and lower lung regions noted with suspected small  superimposed pleural effusion. There is mild left base atelectasis. Left lung otherwise clear. Heart is mildly enlarged with pulmonary vascularity normal. No adenopathy. There is degenerative change in the thoracic spine. IMPRESSION: Ill-defined opacity right mid and lower lung zones, likely due to a degree of pneumonia with superimposed small pleural effusion. Mild left base atelectasis. Stable cardiac prominence. No adenopathy evident. Electronically Signed   By: Lowella Grip III M.D.   On: 10/18/2019 14:06   VAS [place] LOWER EXTREMITY VENOUS (DVT)  Result Date: 11/07/2019  Lower Venous DVTStudy Indications: Swelling, and Edema.  Limitations: Body habitus and poor ultrasound/tissue interface. Comparison Study: 09/16/19 previous Performing Technologist: Abram Sander RVS  Examination Guidelines: A complete evaluation includes B-mode imaging, spectral Doppler, color Doppler, and power Doppler as needed of all accessible portions of  each vessel. Bilateral testing is considered an integral part of a complete examination. Limited examinations for reoccurring indications may be performed as noted. The reflux portion of the exam is performed with the patient in reverse Trendelenburg.  +---------+---------------+---------+-----------+----------+--------------+ RIGHT    CompressibilityPhasicitySpontaneityPropertiesThrombus Aging +---------+---------------+---------+-----------+----------+--------------+ CFV      Full           Yes      Yes                                 +---------+---------------+---------+-----------+----------+--------------+ SFJ      Full                                                        +---------+---------------+---------+-----------+----------+--------------+ FV Prox  Full                                                        +---------+---------------+---------+-----------+----------+--------------+ FV Mid                                                Not  visualized +---------+---------------+---------+-----------+----------+--------------+ FV Distal               Yes      Yes                                 +---------+---------------+---------+-----------+----------+--------------+ PFV      Full                                                        +---------+---------------+---------+-----------+----------+--------------+ POP      Full           Yes      Yes                                 +---------+---------------+---------+-----------+----------+--------------+ PTV                                                   Not visualized +---------+---------------+---------+-----------+----------+--------------+ PERO                                                  Not visualized +---------+---------------+---------+-----------+----------+--------------+   +---------+---------------+---------+-----------+----------+--------------+ LEFT     CompressibilityPhasicitySpontaneityPropertiesThrombus Aging +---------+---------------+---------+-----------+----------+--------------+ CFV      Full           Yes      Yes                                 +---------+---------------+---------+-----------+----------+--------------+  SFJ      Full                                                        +---------+---------------+---------+-----------+----------+--------------+ FV Prox  Full                                                        +---------+---------------+---------+-----------+----------+--------------+ FV Mid                  Yes      Yes                                 +---------+---------------+---------+-----------+----------+--------------+ FV Distal               Yes      Yes                                 +---------+---------------+---------+-----------+----------+--------------+ PFV      Full                                                         +---------+---------------+---------+-----------+----------+--------------+ POP      Full           Yes      Yes                                 +---------+---------------+---------+-----------+----------+--------------+ PTV      Full                                                        +---------+---------------+---------+-----------+----------+--------------+ PERO                                                  Not visualized +---------+---------------+---------+-----------+----------+--------------+     Summary: BILATERAL: - No evidence of deep vein thrombosis seen in the lower extremities, bilaterally.   *See table(s) above for measurements and observations. Electronically signed by Curt Jews MD on 11/07/2019 at 4:26:57 PM.    Final    [place] EKG SITE RITE  Result Date: 11/07/2019 If Site Rite image not attached, placement could not be confirmed due to current cardiac rhythm.   Time Spent in minutes  30   Lala Lund M.D on 11/08/2019 at 11:32 AM  To page go to [email or web address] - password [facility]

## 2019-11-08 NOTE — Plan of Care (Signed)

## 2019-11-08 NOTE — TOC Progression Note (Signed)
Transition of Care [facility]) - Progression Note    Patient Details  Name: Vanessa [name] MRN: [id number] Date of Birth: January 10, 1956  Transition of Care [facility]) CM/SW [place], LCSW Phone Number: 11/08/2019, 10:28 AM  Clinical Narrative:    CSW continuing to follow for SNF placement. Per [facility], they were willing to accept patient back but now cannot due to not being able to accept COVID patients. Patient elected not to apply for Medicaid. Only SNFs accepting patients with COVID are:  -[facility]: Sent referral for review, still awaiting response -[facility]: Requires 10 days of out-of-pocket copays up front (Minimum of $2850) if Medicare provides authorization -[facility]: Requiring 10 days of out-of-pocket copays up front     Expected Discharge Plan: [place] Barriers to Discharge: Continued Medical Work up(Patient tested positve for [employer])  Expected Discharge Plan and Services Expected Discharge Plan: [place] In-house Referral: Clinical Social Work Discharge Planning Services: CM Consult Post Acute Care Choice: [place] Living arrangements for the past 2 months: [place]                                       Social Determinants of Health (SDOH) Interventions    Readmission Risk Interventions Readmission Risk Prevention Plan 10/21/2019 09/25/2019  Transportation Screening Complete Complete  PCP or Specialist Appt within 3-5 Days - Not Complete  Not Complete comments - plan for SNF  [place] or [place] - Complete  Social Work Consult for [place] Planning/Counseling - Complete  Palliative Care Screening - Not Applicable  Medication Review Press photographer) Referral to Pharmacy Referral to Pharmacy  PCP or Specialist appointment within 3-5 days of discharge Complete -  [place] or [place] (No Data) -  SW Recovery Care/Counseling Consult Complete -  Palliative Care Screening Not  Applicable -  Skilled Nursing Facility Complete -  Some recent data might be hidden

## 2019-11-08 NOTE — TOC Progression Note (Addendum)
Transition of Care [facility]) - Progression Note    Patient Details  Name: Vanessa [name] MRN: [id number] Date of Birth: 09/13/1955  Transition of Care [facility]) CM/SW [place], LCSW Phone Number: 11/08/2019, 2:02 PM  Clinical Narrative:    2pm-[facility] verifying insurance information but may be able to accept patient and bill her copays rather than pay up front. CSW spoke with patient who reports hope that [facility].   2:30pm-[facility] able to accept patient Monday if medically stable. Will request insurance approval on Sunday.     Expected Discharge Plan: Skilled Nursing Facility Barriers to Discharge: Continued Medical Work up, Ship [employer]  Expected Discharge Plan and Services Expected Discharge Plan: [place] In-house Referral: Clinical Social Work Discharge Planning Services: CM Consult Post Acute Care Choice: [place] Living arrangements for the past 2 months: [place]                                       Social Determinants of Health (SDOH) Interventions    Readmission Risk Interventions Readmission Risk Prevention Plan 11/08/2019 10/21/2019 09/25/2019  Transportation Screening Complete Complete Complete  PCP or Specialist Appt within 3-5 Days - - Not Complete  Not Complete comments - - plan for SNF  [place] or [place] - - Complete  Social Work Consult for [place] Planning/Counseling - - Complete  Palliative Care Screening - - Not Applicable  Medication Review Press photographer) Complete Referral to Pharmacy Referral to Pharmacy  PCP or Specialist appointment within 3-5 days of discharge Complete Complete -  [place] or Home Care Consult Complete (No Data) -  SW Recovery Care/Counseling Consult Complete Complete -  Palliative Care Screening Not Applicable Not Applicable -  Skilled Nursing Facility Complete Complete -  Some recent data might be hidden

## 2019-11-09 ENCOUNTER — Inpatient Hospital Stay (HOSPITAL_COMMUNITY): Payer: Medicare Other

## 2019-11-09 LAB — CBC WITH DIFFERENTIAL/PLATELET
Abs Immature Granulocytes: 0.04 10*3/uL (ref 0.00–0.07)
Basophils Absolute: 0 10*3/uL (ref 0.0–0.1)
Basophils Relative: 0 %
Eosinophils Absolute: 0 10*3/uL (ref 0.0–0.5)
Eosinophils Relative: 0 %
HCT: 29 % — ABNORMAL LOW (ref 36.0–46.0)
Hemoglobin: 8.6 g/dL — ABNORMAL LOW (ref 12.0–15.0)
Immature Granulocytes: 1 %
Lymphocytes Relative: 7 %
Lymphs Abs: 0.3 10*3/uL — ABNORMAL LOW (ref 0.7–4.0)
MCH: 29 pg (ref 26.0–34.0)
MCHC: 29.7 g/dL — ABNORMAL LOW (ref 30.0–36.0)
MCV: 97.6 fL (ref 80.0–100.0)
Monocytes Absolute: 0.3 10*3/uL (ref 0.1–1.0)
Monocytes Relative: 6 %
Neutro Abs: 3.7 10*3/uL (ref 1.7–7.7)
Neutrophils Relative %: 86 %
Platelets: 144 10*3/uL — ABNORMAL LOW (ref 150–400)
RBC: 2.97 MIL/uL — ABNORMAL LOW (ref 3.87–5.11)
RDW: 18.5 % — ABNORMAL HIGH (ref 11.5–15.5)
WBC: 4.3 10*3/uL (ref 4.0–10.5)
nRBC: 0.5 % — ABNORMAL HIGH (ref 0.0–0.2)

## 2019-11-09 LAB — GLUCOSE, CAPILLARY
Glucose-Capillary: 140 mg/dL — ABNORMAL HIGH (ref 70–99)
Glucose-Capillary: 149 mg/dL — ABNORMAL HIGH (ref 70–99)
Glucose-Capillary: 185 mg/dL — ABNORMAL HIGH (ref 70–99)
Glucose-Capillary: 175 mg/dL — ABNORMAL HIGH (ref 70–99)

## 2019-11-09 LAB — MAGNESIUM: Magnesium: 1.8 mg/dL (ref 1.7–2.4)

## 2019-11-09 LAB — BRAIN NATRIURETIC PEPTIDE: B Natriuretic Peptide: 1043.9 pg/mL — ABNORMAL HIGH (ref 0.0–100.0)

## 2019-11-09 LAB — COMPREHENSIVE METABOLIC PANEL
ALT: 5 U/L (ref 0–44)
AST: 15 U/L (ref 15–41)
Albumin: 2.4 g/dL — ABNORMAL LOW (ref 3.5–5.0)
Alkaline Phosphatase: 100 U/L (ref 38–126)
Anion gap: 12 (ref 5–15)
BUN: 37 mg/dL — ABNORMAL HIGH (ref 8–23)
CO2: 25 mmol/L (ref 22–32)
Calcium: 8.8 mg/dL — ABNORMAL LOW (ref 8.9–10.3)
Chloride: 111 mmol/L (ref 98–111)
Creatinine, Ser: 1.93 mg/dL — ABNORMAL HIGH (ref 0.44–1.00)
GFR calc Af Amer: 31 mL/min — ABNORMAL LOW (ref >60)
GFR calc non Af Amer: 27 mL/min — ABNORMAL LOW (ref >60)
Glucose, Bld: 134 mg/dL — ABNORMAL HIGH (ref 70–99)
Potassium: 3.6 mmol/L (ref 3.5–5.1)
Sodium: 148 mmol/L — ABNORMAL HIGH (ref 135–145)
Total Bilirubin: 0.7 mg/dL (ref 0.3–1.2)
Total Protein: 6.5 g/dL (ref 6.5–8.1)

## 2019-11-09 LAB — C-REACTIVE PROTEIN: CRP: 9.9 mg/dL — ABNORMAL HIGH (ref <1.0)

## 2019-11-09 LAB — PROCALCITONIN: Procalcitonin: <0.1 ng/mL

## 2019-11-09 LAB — D-DIMER, QUANTITATIVE: D-Dimer, Quant: 2 ug/mL-FEU — ABNORMAL HIGH (ref 0.00–0.50)

## 2019-11-09 MED ORDER — FERROUS SULFATE 325 (65 FE) MG PO TABS
325.0000 mg | ORAL_TABLET | Freq: Three times a day (TID) | ORAL | Status: DC
  Administered 2019-11-09 – 2019-11-22 (×38): 325 mg via ORAL
  Filled 2019-11-09 (×38): qty 1

## 2019-11-09 MED ORDER — DEXTROSE 5 % IV SOLN: INTRAVENOUS | Status: DC

## 2019-11-09 MED ORDER — HYDROCODONE-ACETAMINOPHEN 5-325 MG PO TABS
1.0000 | ORAL_TABLET | Freq: Four times a day (QID) | ORAL | Status: DC | PRN
  Administered 2019-11-09 (×2): 1 via ORAL
  Filled 2019-11-09 (×2): qty 1

## 2019-11-09 MED ORDER — TRAZODONE HCL 50 MG PO TABS
50.0000 mg | ORAL_TABLET | Freq: Once | ORAL | Status: AC
  Administered 2019-11-09: 50 mg via ORAL
  Filled 2019-11-09: qty 1

## 2019-11-09 NOTE — Progress Notes (Addendum)
PROGRESS NOTE                                                                                                                                                                                                             Patient Demographics:    Vanessa [name], is a 64 y.o. female, DOB - 09/12/1955, GGY:[id number]  Admit date - 11/05/2019   Admitting Physician Mckinley Jewel, MD  Outpatient Primary MD for the patient is Maren Reamer, MD (Inactive)  LOS - 3  CC - fever     Brief Narrative  Vanessa [name] is a 64 y.o. female with medical history significant of hypertension, diabetes, hyperlipidemia, grade 2 diastolic congestive heart failure, CKD stage IIIb, endometrial cancer presents to emergency department due to generalized weakness and lethargy, she was diagnosed with COVID-19 pneumonia with sepsis and hypothermia and admitted to the hospital.    She was recently hospitalized for MSSA bacteremia few weeks ago.  She also carries a history of endometrial cancer under the care of Dr. Berline Lopes GYN oncology deemed a poor surgical candidate for now, also recent GI bleed due to duodenal ulcer.   Subjective:   Patient in bed, appears comfortable, denies any headache, no fever, no chest pain or pressure, no shortness of breath , no abdominal pain. No focal weakness.   Assessment  & Plan :     1.  Sepsis.  Etiology not entirely clear, she is hypothermic without leukocytosis, does have COVID-19 infection and mild pneumonia and could be septic from it however she recently had MSSA bacteremia few weeks ago.  At this point repeat blood cultures will be ordered, we will trend her procalcitonin and blood cultures closely on antibiotics.  Low-dose Decadron and remdesivir will be started as well.  Unremarkable CT chest abdomen pelvis noncontrast and lower extremity leg ultrasound ruling out DVT.  Overall sepsis pathophysiology seems to have resolved, continue treatment for COVID-19 infection  along with empiric antibiotics and monitor cultures closely.  2.  Endometrial cancer with intermittent bleeding and spotting.  Stable H&H, will type screen, outpatient follow-up with [facility] oncologist Dr. Berline Lopes once discharged.  3.  History of duodenal ulcer with GI bleed.  Oral PPI, SCDs for now and monitor.  Follow with primary gastroenterologist post discharge.  4.  Borderline elevated D-dimer.  Could be due to her endometrial bleed history, negative lower extremity ultrasound.  Tough situation due to recent history of upper GI bleed with ongoing intermittent bleed from her vagina.  Avoiding chemical anticoagulation for now.  5.  Hypertension.  Continue combination of Norvasc, Coreg, increased hydralazine dose for better control.  6.  Intravascular dehydration with hypernatremia.  Gentle D5W and monitor.  7.  Hypothermia.  Likely due to sepsis.  Stable TSH and random cortisol, improved after supportive care will monitor via rectal probe.    8.  Leg edema mostly nonpitting.  Monitor.    9.  MIcrocytic iron deficiency anemia.  On oral iron supplementation likely due to ongoing intermittent vaginal blood loss, outpatient follow-up with PCP and GYN oncology.  10. DM type II.  ISS.  Follow closely blood sugars as she is now on D5W and Decadron.  May require Lantus and adjustment of short-acting insulin.  Lab Results  Component Value Date   HGBA1C 5.7 (H) 09/15/2019   CBG (last 3)  Recent Labs    11/08/19 1550 11/08/19 2039 11/09/19 0813  GLUCAP 207* 192* 140*      Condition.  Extremely guarded.  Explained to patient and husband.    Family Communication  :  husband Pogorzelski [phone] on 11/07/19  Code Status :  Full  Disposition Plan  :    Status is: Inpatient  Remains inpatient appropriate because:Hemodynamically unstable   Dispo: The patient is from: SNF              Anticipated d/c is to: SNF              Anticipated d/c date is: > 3 days              Patient currently  is not medically stable to d/c.   Consults  :  None  Procedures  :    DVT Prophylaxis  :  SCDs    Lab Results  Component Value Date   PLT 144 (L) 11/09/2019    Diet :  Diet Order            Diet heart healthy/carb modified Room service appropriate? Yes; Fluid consistency: Thin  Diet effective now               Inpatient Medications Scheduled Meds: . albuterol  2 puff Inhalation Q4H  . amLODipine  10 mg Oral Daily  . vitamin C  500 mg Oral Daily  . carvedilol  12.5 mg Oral BID WC  . Chlorhexidine Gluconate Cloth  6 each Topical Daily  . dexamethasone (DECADRON) injection  6 mg Intravenous Q24H  . feeding supplement (PRO-STAT SUGAR FREE 64)  30 mL Oral TID WC  . hydrALAZINE  100 mg Oral Q8H  . insulin aspart  0-15 Units Subcutaneous TID WC  . insulin aspart  0-5 Units Subcutaneous QHS  . mouth rinse  15 mL Mouth Rinse BID  . pantoprazole sodium  40 mg Oral Daily  . polyethylene glycol  17 g Oral Daily  . sodium chloride flush  3 mL Intravenous Once  . zinc sulfate  220 mg Oral Daily   Continuous Infusions: . dextrose 75 mL/hr at 11/09/19 0843  . piperacillin-tazobactam (ZOSYN)  IV 3.375 g (11/09/19 0525)  . remdesivir 100 mg in NS 100 mL 100 mg (11/09/19 0846)  . vancomycin     PRN Meds:.acetaminophen, chlorpheniramine-HYDROcodone, guaiFENesin-dextromethorphan, HYDROcodone-acetaminophen, loratadine, [DISCONTINUED] ondansetron **OR** ondansetron (ZOFRAN) IV, sodium chloride flush  Antibiotics  :   Anti-infectives (From admission, onward)   Start     Dose/Rate Route Frequency Ordered Stop   11/09/19 1230  vancomycin (VANCOREADY) IVPB 1250 mg/250 mL  1,250 mg 166.7 mL/hr over 90 Minutes Intravenous Every 48 hours 11/07/19 1100     11/08/19 1000  remdesivir 100 mg in sodium chloride 0.9 % 100 mL IVPB     100 mg 200 mL/hr over 30 Minutes Intravenous Daily 11/07/19 1217 11/12/19 0959   11/07/19 1600  piperacillin-tazobactam (ZOSYN) IVPB 3.375 g     3.375 g 12.5  mL/hr over 240 Minutes Intravenous Every 8 hours 11/07/19 1033     11/07/19 1245  remdesivir 200 mg in sodium chloride 0.9% 250 mL IVPB     200 mg 580 mL/hr over 30 Minutes Intravenous Once 11/07/19 1216 11/07/19 1728   11/07/19 1130  vancomycin (VANCOREADY) IVPB 2000 mg/400 mL     2,000 mg 200 mL/hr over 120 Minutes Intravenous  Once 11/07/19 1053 11/07/19 1432   11/07/19 1100  piperacillin-tazobactam (ZOSYN) IVPB 3.375 g     3.375 g 100 mL/hr over 30 Minutes Intravenous  Once 11/07/19 1033 11/07/19 1303          Objective:   Vitals:   11/09/19 0802 11/09/19 1111 11/09/19 1136 11/09/19 1145  BP: (!) 144/83  (!) 161/112   Pulse: 77 (!) 57 83 85  Resp: 11 10 18 14   Temp: (!) 96.2 F (35.7 C)  (!) 94.3 F (34.6 C) (!) 95 F (35 C)  TempSrc: Axillary  Axillary   SpO2: 93% 92% 93% 94%  Weight:      Height:        SpO2: 94 % O2 Flow Rate (L/min): 2 L/min  Wt Readings from Last 3 Encounters:  11/08/19 104.9 kg  10/18/19 100.3 kg  10/17/19 94.9 kg     Intake/Output Summary (Last 24 hours) at 11/09/2019 1148 Last data filed at 11/09/2019 1000 Gross per 24 hour  Intake 1234.9 ml  Output 1200 ml  Net 34.9 ml     Physical Exam  Awake Alert, No new F.N deficits, Normal affect [place].AT,PERRAL Supple Neck,No JVD, No cervical lymphadenopathy appriciated.  Symmetrical Chest wall movement, Good air movement bilaterally, CTAB RRR,No Gallops, Rubs or new Murmurs, No Parasternal Heave +ve B.Sounds, Abd Soft, No tenderness, No organomegaly appriciated, No rebound - guarding or rigidity. No Cyanosis, Clubbing or edema, No new Rash or bruise    Data Review:    Recent Labs  Lab 11/05/19 1238 11/05/19 1238 11/05/19 2129 11/06/19 2140 11/07/19 0441 11/08/19 0437 11/09/19 0430  WBC 5.8  --  5.4  --  5.0 4.5 4.3  HGB 8.7*  --  9.0*  --  8.9* 8.5* 8.6*  HCT 29.8*   < > 30.5* 29.5* 29.7* 29.4* 29.0*  PLT 189  --  180  --  153 142* 144*  MCV 101.7*  --  100.3*  --  99.7 101.4*  97.6  MCH 29.7  --  29.6  --  29.9 29.3 29.0  MCHC 29.2*  --  29.5*  --  30.0 28.9* 29.7*  RDW 19.1*  --  19.1*  --  18.6* 18.5* 18.5*  LYMPHSABS  --   --  0.3*  --  0.3* 0.2* 0.3*  MONOABS  --   --  0.4  --  0.2 0.1 0.3  EOSABS  --   --  0.0  --  0.0 0.0 0.0  BASOSABS  --   --  0.0  --  0.0 0.0 0.0   < > = values in this interval not displayed.    Recent Labs  Lab 11/05/19 1238 11/06/19 2140 11/07/19 0441 11/08/19  3474 11/08/19 0500 11/09/19 0430  NA 149* 148* 149* 147*  --  148*  K 3.6 3.5 3.2* 3.9  --  3.6  CL 112* 112* 112* 111  --  111  CO2 24 26 26 25   --  25  GLUCOSE 117* 109* 110* 188*  --  134*  BUN 34* 28* 28* 31*  --  37*  CREATININE 2.15* 1.86* 1.76* 1.88*  --  1.93*  CALCIUM 8.7* 8.7* 8.5* 8.4*  --  8.8*  AST  --  18 15 14*  --  15  ALT  --  5 6 7   --  5  ALKPHOS  --  113 103 105  --  100  BILITOT  --  0.6 0.8 0.8  --  0.7  ALBUMIN  --  2.8* 2.6* 2.4*  --  2.4*  MG  --   --  1.8 1.9  --  1.8  CRP  --  8.7* 8.5* 11.2*  --  9.9*  DDIMER  --  1.54* 1.31* 1.34*  --  2.00*  PROCALCITON  --  0.46 <0.10 <0.10  --  <0.10  INR  --   --   --  1.2  --   --   TSH  --  5.158*  --   --   --   --   BNP  --  656.0*  --   --  573.1* 1,043.9*    Recent Labs  Lab 11/06/19 1054 11/06/19 2140 11/07/19 0441 11/08/19 0437 11/08/19 0500 11/09/19 0430  CRP  --  8.7* 8.5* 11.2*  --  9.9*  DDIMER  --  1.54* 1.31* 1.34*  --  2.00*  BNP  --  656.0*  --   --  573.1* 1,043.9*  PROCALCITON  --  0.46 <0.10 <0.10  --  <0.10  SARSCOV2NAA POSITIVE*  --   --   --   --   --     ------------------------------------------------------------------------------------------------------------------ No results for input(s): CHOL, HDL, LDLCALC, TRIG, CHOLHDL, LDLDIRECT in the last 72 hours.  Lab Results  Component Value Date   HGBA1C 5.7 (H) 09/15/2019   ------------------------------------------------------------------------------------------------------------------ Recent Labs     11/06/19 2140  TSH 5.158*   ------------------------------------------------------------------------------------------------------------------ Recent Labs    11/06/19 2140 11/06/19 2140 11/07/19 0441 11/08/19 1145  VITAMINB12 602  --   --  640  FOLATE  --   --   --  11.4  FERRITIN 172   < > 166 170  TIBC  --   --   --  209*  IRON  --   --   --  20*  RETICCTPCT  --   --   --  1.2   < > = values in this interval not displayed.    Coagulation profile Recent Labs  Lab 11/08/19 0437  INR 1.2    Recent Labs    11/08/19 0437 11/09/19 0430  DDIMER 1.34* 2.00*    Cardiac Enzymes No results for input(s): CKMB, TROPONINI, MYOGLOBIN in the last 168 hours.  Invalid input(s): CK ------------------------------------------------------------------------------------------------------------------    Component Value Date/Time   BNP 1,043.9 (H) 11/09/2019 0430    Micro Results Recent Results (from the past 240 hour(s))  Respiratory Panel by RT PCR (Flu A&B, Covid) - Nasopharyngeal Swab     Status: Abnormal   Collection Time: 11/06/19 10:54 AM   Specimen: Nasopharyngeal Swab  Result Value Ref Range Status   SARS Coronavirus 2 by RT PCR POSITIVE (A) NEGATIVE Final  Comment: RESULT CALLED TO, READ BACK BY AND VERIFIED WITH: Marzella Schlein RN 12:35 11/06/19 ([email or web address]) (NOTE) SARS-CoV-2 target nucleic acids are DETECTED. SARS-CoV-2 RNA is generally detectable in upper respiratory specimens  during the acute phase of infection. Positive results are indicative of the presence of the identified virus, but do not rule out bacterial infection or co-infection with other pathogens not detected by the test. Clinical correlation with patient history and other diagnostic information is necessary to determine patient infection status. The expected result is Negative. Fact Sheet for Patients:  PinkCheek.be Fact Sheet for Healthcare  Providers: GravelBags.it This test is not yet approved or cleared by the [place] FDA and  has been authorized for detection and/or diagnosis of SARS-CoV-2 by FDA under an Emergency Use Authorization (EUA).  This EUA will remain in effect (meaning this test can be used)  for the duration of  the COVID-19 declaration under Section 564(b)(1) of the Act, 21 U.S.C. section 360bbb-3(b)(1), unless the authorization is terminated or revoked sooner.    Influenza A by PCR NEGATIVE NEGATIVE Final   Influenza B by PCR NEGATIVE NEGATIVE Final    Comment: (NOTE) The Xpert Xpress SARS-CoV-2/FLU/RSV assay is intended as an aid in  the diagnosis of influenza from Nasopharyngeal swab specimens and  should not be used as a sole basis for treatment. Nasal washings and  aspirates are unacceptable for Xpert Xpress SARS-CoV-2/FLU/RSV  testing. Fact Sheet for Patients: PinkCheek.be Fact Sheet for Healthcare Providers: GravelBags.it This test is not yet approved or cleared by the [place] FDA and  has been authorized for detection and/or diagnosis of SARS-CoV-2 by  FDA under an Emergency Use Authorization (EUA). This EUA will remain  in effect (meaning this test can be used) for the duration of the  Covid-19 declaration under Section 564(b)(1) of the Act, 21  U.S.C. section 360bbb-3(b)(1), unless the authorization is  terminated or revoked. Performed at [facility], [place] [address], [place], [place] [address]   Culture, blood (Routine X 2) w Reflex to ID Panel     Status: None (Preliminary result)   Collection Time: 11/06/19  9:40 PM   Specimen: BLOOD  Result Value Ref Range Status   Specimen Description BLOOD RIGHT ANTECUBITAL  Final   Special Requests   Final    BOTTLES DRAWN AEROBIC AND ANAEROBIC Blood Culture adequate volume   Culture   Final    NO GROWTH 3 DAYS Performed at [facility], [place] [address], [place], [place] [address]    Report Status PENDING  Incomplete  Culture, blood (Routine X 2) w Reflex to ID Panel     Status: None (Preliminary result)   Collection Time: 11/06/19  9:49 PM   Specimen: BLOOD  Result Value Ref Range Status   Specimen Description BLOOD LEFT ANTECUBITAL  Final   Special Requests   Final    BOTTLES DRAWN AEROBIC ONLY Blood Culture adequate volume   Culture   Final    NO GROWTH 3 DAYS Performed at [facility], [place] [address], [place],  [address]    Report Status PENDING  Incomplete    Radiology Reports CT ABDOMEN PELVIS WO CONTRAST  Result Date: 11/07/2019 CLINICAL DATA:  Generalized weakness and lethargy. Sepsis. Ascites. Cough. COVID positive yesterday. EXAM: CT CHEST, ABDOMEN AND PELVIS WITHOUT CONTRAST TECHNIQUE: Multidetector CT imaging of the chest, abdomen and pelvis was performed following the standard protocol without IV contrast. COMPARISON:  Radiograph yesterday. Abdominopelvic CT 09/15/2019 FINDINGS:  CT CHEST FINDINGS Cardiovascular: Right upper extremity PICC with tip in the SVC. Motion artifact partially obscures detailed evaluation. Normal caliber thoracic aorta. Mild atherosclerosis. Mild cardiomegaly. No pericardial effusion. Mediastinum/Nodes: Motion and lack of contrast limits assessment for adenopathy. Small mediastinal lymph nodes not definitively enlarged. Assessment for hilar adenopathy is significantly limited. Esophagus is patulous. No suspicious thyroid nodule. Lungs/Pleura: Moderate right and small left pleural effusion. Adjacent compressive atelectasis. There are multifocal geographic patchy ground-glass opacities throughout both lungs. Trachea and mainstem bronchi are grossly patent, partially motion obscured. Musculoskeletal: Generalized subcutaneous edema throughout the chest wall. Diffuse degenerative change throughout the thoracic spine. There are no acute or suspicious osseous abnormalities. CT ABDOMEN  PELVIS FINDINGS Hepatobiliary: No evidence of focal hepatic abnormality on noncontrast exam. Liver is prominent size spanning 20 cm cranial caudal. Question of nodular hepatic contours. Hyperdense gallbladder contents. No calcified gallstone. Common bile duct is not well-defined. Pancreas: No pancreatic ductal dilatation. No disproportionate peripancreatic edema. Spleen: Normal in size without focal abnormality. Adrenals/Urinary Tract: Adrenal thickening without dominant nodule. Chronic left renal atrophy. Left ureter is nondilated, unchanged 7 mm calcification in the left pelvis equivocally within the ureter. Right kidney appears normal in size. No hydronephrosis or renal calculi. Urinary bladder is distended and unremarkable. Stomach/Bowel: Bowel evaluation is limited in the absence of contrast, presence of ascites, and motion artifact. Nondistended stomach, grossly unremarkable. No small bowel obstruction. Appendix not visualized. Moderate volume of stool throughout the colon. There is mild colonic redundancy. No obvious colonic wall thickening or pericolonic inflammation. Vascular/Lymphatic: Abdominal aorta is normal in caliber. No bulky abdominopelvic adenopathy. Reproductive: Uterus is slightly bulky. Limited assessment for adnexal mass given pelvic ascites. Other: Small to moderate volume abdominopelvic ascites. There is generalized edema of the intra-abdominal fat. Prominent generalized edema of the subcutaneous fat, confluent in the flanks. No free intra-abdominal air. Musculoskeletal: Degenerative change in the lumbar spine. There are no acute or suspicious osseous abnormalities. IMPRESSION: 1. Multifocal geographic patchy ground-glass opacities throughout both lungs, consistent with COVID-19 pneumonia. 2. Fluid overload. Moderate right and small left pleural effusion. Small to moderate volume abdominopelvic ascites. Generalized subcutaneous edema of the chest and abdomen consistent with third-spacing,  confluent edema in the flanks. 3. Question of nodular hepatic contours, recommend correlation for cirrhosis. 4. Chronic left renal atrophy. Unchanged 7 mm calcification in the left pelvis equivocally within the ureter. No hydronephrosis. Aortic Atherosclerosis (ICD10-I70.0). Electronically Signed   By: Keith Rake M.D.   On: 11/07/2019 23:00   CT CHEST WO CONTRAST  Result Date: 11/07/2019 CLINICAL DATA:  Generalized weakness and lethargy. Sepsis. Ascites. Cough. COVID positive yesterday. EXAM: CT CHEST, ABDOMEN AND PELVIS WITHOUT CONTRAST TECHNIQUE: Multidetector CT imaging of the chest, abdomen and pelvis was performed following the standard protocol without IV contrast. COMPARISON:  Radiograph yesterday. Abdominopelvic CT 09/15/2019 FINDINGS: CT CHEST FINDINGS Cardiovascular: Right upper extremity PICC with tip in the SVC. Motion artifact partially obscures detailed evaluation. Normal caliber thoracic aorta. Mild atherosclerosis. Mild cardiomegaly. No pericardial effusion. Mediastinum/Nodes: Motion and lack of contrast limits assessment for adenopathy. Small mediastinal lymph nodes not definitively enlarged. Assessment for hilar adenopathy is significantly limited. Esophagus is patulous. No suspicious thyroid nodule. Lungs/Pleura: Moderate right and small left pleural effusion. Adjacent compressive atelectasis. There are multifocal geographic patchy ground-glass opacities throughout both lungs. Trachea and mainstem bronchi are grossly patent, partially motion obscured. Musculoskeletal: Generalized subcutaneous edema throughout the chest wall. Diffuse degenerative change throughout the thoracic spine. There are no acute or suspicious osseous abnormalities. CT ABDOMEN  PELVIS FINDINGS Hepatobiliary: No evidence of focal hepatic abnormality on noncontrast exam. Liver is prominent size spanning 20 cm cranial caudal. Question of nodular hepatic contours. Hyperdense gallbladder contents. No calcified gallstone.  Common bile duct is not well-defined. Pancreas: No pancreatic ductal dilatation. No disproportionate peripancreatic edema. Spleen: Normal in size without focal abnormality. Adrenals/Urinary Tract: Adrenal thickening without dominant nodule. Chronic left renal atrophy. Left ureter is nondilated, unchanged 7 mm calcification in the left pelvis equivocally within the ureter. Right kidney appears normal in size. No hydronephrosis or renal calculi. Urinary bladder is distended and unremarkable. Stomach/Bowel: Bowel evaluation is limited in the absence of contrast, presence of ascites, and motion artifact. Nondistended stomach, grossly unremarkable. No small bowel obstruction. Appendix not visualized. Moderate volume of stool throughout the colon. There is mild colonic redundancy. No obvious colonic wall thickening or pericolonic inflammation. Vascular/Lymphatic: Abdominal aorta is normal in caliber. No bulky abdominopelvic adenopathy. Reproductive: Uterus is slightly bulky. Limited assessment for adnexal mass given pelvic ascites. Other: Small to moderate volume abdominopelvic ascites. There is generalized edema of the intra-abdominal fat. Prominent generalized edema of the subcutaneous fat, confluent in the flanks. No free intra-abdominal air. Musculoskeletal: Degenerative change in the lumbar spine. There are no acute or suspicious osseous abnormalities. IMPRESSION: 1. Multifocal geographic patchy ground-glass opacities throughout both lungs, consistent with COVID-19 pneumonia. 2. Fluid overload. Moderate right and small left pleural effusion. Small to moderate volume abdominopelvic ascites. Generalized subcutaneous edema of the chest and abdomen consistent with third-spacing, confluent edema in the flanks. 3. Question of nodular hepatic contours, recommend correlation for cirrhosis. 4. Chronic left renal atrophy. Unchanged 7 mm calcification in the left pelvis equivocally within the ureter. No hydronephrosis. Aortic  Atherosclerosis (ICD10-I70.0). Electronically Signed   By: Keith Rake M.D.   On: 11/07/2019 23:00   DG Chest Port 1 View  Result Date: 11/09/2019 CLINICAL DATA:  Weakness, COVID-19 positive EXAM: PORTABLE CHEST 1 VIEW COMPARISON:  Chest radiograph from one day prior. FINDINGS: Right PICC terminates in the lower third of the SVC. Stable cardiomediastinal silhouette with mild cardiomegaly. No pneumothorax. No pleural effusion. Extensive patchy opacities throughout both lungs, slightly worsened in the upper lungs. IMPRESSION: Extensive patchy opacities throughout both lungs, slightly worsened in the upper lungs, compatible with COVID-19 pneumonia, although with component of pulmonary edema not excluded. Mild cardiomegaly. Electronically Signed   By: Ilona Sorrel M.D.   On: 11/09/2019 08:19   DG Chest Port 1 View  Result Date: 11/08/2019 CLINICAL DATA:  Shortness of breath, COVID-19, diabetes mellitus, hypertension, stage III chronic kidney disease, history endometrial cancer EXAM: PORTABLE CHEST 1 VIEW COMPARISON:  Portable exam 0816 hours compared to 11/06/2019 FINDINGS: RIGHT arm PICC line tip projects over SVC. Enlargement of cardiac silhouette with stable mediastinal contours. Extensive BILATERAL pulmonary infiltrates greatest perihilar on RIGHT, question asymmetric pulmonary edema versus multifocal infection. No pleural effusion or pneumothorax. Bones demineralized with scattered degenerative changes of the thoracic spine. IMPRESSION: Enlargement of cardiac silhouette with increased BILATERAL pulmonary infiltrates greatest RIGHT perihilar question asymmetric pulmonary edema versus multifocal pneumonia. Electronically Signed   By: Lavonia Dana M.D.   On: 11/08/2019 08:51   DG CHEST PORT 1 VIEW  Result Date: 11/06/2019 CLINICAL DATA:  Weakness. EXAM: PORTABLE CHEST 1 VIEW COMPARISON:  November 05, 2019. FINDINGS: Stable cardiomegaly with possible central pulmonary vascular congestion. No pneumothorax is  noted. Mild bibasilar atelectasis is noted with small pleural effusions. Bony thorax is unremarkable. IMPRESSION: Stable cardiomegaly with possible central pulmonary vascular congestion. Mild bibasilar  subsegmental atelectasis is noted with small pleural effusions. Electronically Signed   By: Marijo Conception M.D.   On: 11/06/2019 15:57   DG Chest Port 1 View  Result Date: 11/05/2019 CLINICAL DATA:  Weakness. Found on floor today. EXAM: PORTABLE CHEST 1 VIEW COMPARISON:  10/18/2019 FINDINGS: Stable heart size and mediastinal contours with mild cardiomegaly. Hazy opacity at both lung bases likely combination of pleural fluid and airspace disease/atelectasis. Vascular congestion. No pneumothorax. Left proximal humerus fracture appears subacute with surrounding heterotopic ossification, also seen on 09/17/2019 shoulder radiograph. IMPRESSION: 1. Hazy opacity at both lung bases likely combination of pleural fluid and airspace disease/atelectasis. This is stable on the right but progressive on the left from prior. 2. Stable mild cardiomegaly.  Vascular congestion. Electronically Signed   By: Keith Rake M.D.   On: 11/05/2019 17:02   DG Chest Portable 1 View  Result Date: 10/18/2019 CLINICAL DATA:  Hypertension and shortness of breath EXAM: PORTABLE CHEST 1 VIEW COMPARISON:  September 17, 2019 FINDINGS: Ill-defined opacity in the right mid and lower lung regions noted with suspected small superimposed pleural effusion. There is mild left base atelectasis. Left lung otherwise clear. Heart is mildly enlarged with pulmonary vascularity normal. No adenopathy. There is degenerative change in the thoracic spine. IMPRESSION: Ill-defined opacity right mid and lower lung zones, likely due to a degree of pneumonia with superimposed small pleural effusion. Mild left base atelectasis. Stable cardiac prominence. No adenopathy evident. Electronically Signed   By: Lowella Grip III M.D.   On: 10/18/2019 14:06   VAS [place] LOWER  EXTREMITY VENOUS (DVT)  Result Date: 11/07/2019  Lower Venous DVTStudy Indications: Swelling, and Edema.  Limitations: Body habitus and poor ultrasound/tissue interface. Comparison Study: 09/16/19 previous Performing Technologist: Abram Sander RVS  Examination Guidelines: A complete evaluation includes B-mode imaging, spectral Doppler, color Doppler, and power Doppler as needed of all accessible portions of each vessel. Bilateral testing is considered an integral part of a complete examination. Limited examinations for reoccurring indications may be performed as noted. The reflux portion of the exam is performed with the patient in reverse Trendelenburg.  +---------+---------------+---------+-----------+----------+--------------+ RIGHT    CompressibilityPhasicitySpontaneityPropertiesThrombus Aging +---------+---------------+---------+-----------+----------+--------------+ CFV      Full           Yes      Yes                                 +---------+---------------+---------+-----------+----------+--------------+ SFJ      Full                                                        +---------+---------------+---------+-----------+----------+--------------+ FV Prox  Full                                                        +---------+---------------+---------+-----------+----------+--------------+ FV Mid  Not visualized +---------+---------------+---------+-----------+----------+--------------+ FV Distal               Yes      Yes                                 +---------+---------------+---------+-----------+----------+--------------+ PFV      Full                                                        +---------+---------------+---------+-----------+----------+--------------+ POP      Full           Yes      Yes                                  +---------+---------------+---------+-----------+----------+--------------+ PTV                                                   Not visualized +---------+---------------+---------+-----------+----------+--------------+ PERO                                                  Not visualized +---------+---------------+---------+-----------+----------+--------------+   +---------+---------------+---------+-----------+----------+--------------+ LEFT     CompressibilityPhasicitySpontaneityPropertiesThrombus Aging +---------+---------------+---------+-----------+----------+--------------+ CFV      Full           Yes      Yes                                 +---------+---------------+---------+-----------+----------+--------------+ SFJ      Full                                                        +---------+---------------+---------+-----------+----------+--------------+ FV Prox  Full                                                        +---------+---------------+---------+-----------+----------+--------------+ FV Mid                  Yes      Yes                                 +---------+---------------+---------+-----------+----------+--------------+ FV Distal               Yes      Yes                                 +---------+---------------+---------+-----------+----------+--------------+ PFV      Full                                                        +---------+---------------+---------+-----------+----------+--------------+  POP      Full           Yes      Yes                                 +---------+---------------+---------+-----------+----------+--------------+ PTV      Full                                                        +---------+---------------+---------+-----------+----------+--------------+ PERO                                                  Not visualized  +---------+---------------+---------+-----------+----------+--------------+     Summary: BILATERAL: - No evidence of deep vein thrombosis seen in the lower extremities, bilaterally.   *See table(s) above for measurements and observations. Electronically signed by Curt Jews MD on 11/07/2019 at 4:26:57 PM.    Final    [place] EKG SITE RITE  Result Date: 11/07/2019 If Site Rite image not attached, placement could not be confirmed due to current cardiac rhythm.   Time Spent in minutes  30   Lala Lund M.D on 11/09/2019 at 11:48 AM  To page go to [email or web address] - password [email or web address]

## 2019-11-09 NOTE — Progress Notes (Signed)
Pt changed due to incontinence and repositioned onto her left side. Pt previous rectal temp was 93.7 but currently 97.7 axillary, bear hugger removed at this time per pt request, TED hose also removed at this time due to pt movement hose keeps falling down and compromising circulation, bilateral SCDs placed at this time, No other needs, will CTM.

## 2019-11-10 LAB — COMPREHENSIVE METABOLIC PANEL
ALT: 6 U/L (ref 0–44)
AST: 17 U/L (ref 15–41)
Albumin: 2.4 g/dL — ABNORMAL LOW (ref 3.5–5.0)
Alkaline Phosphatase: 91 U/L (ref 38–126)
Anion gap: 11 (ref 5–15)
BUN: 41 mg/dL — ABNORMAL HIGH (ref 8–23)
CO2: 26 mmol/L (ref 22–32)
Calcium: 8.5 mg/dL — ABNORMAL LOW (ref 8.9–10.3)
Chloride: 108 mmol/L (ref 98–111)
Creatinine, Ser: 1.85 mg/dL — ABNORMAL HIGH (ref 0.44–1.00)
GFR calc Af Amer: 33 mL/min — ABNORMAL LOW (ref >60)
GFR calc non Af Amer: 28 mL/min — ABNORMAL LOW (ref >60)
Glucose, Bld: 169 mg/dL — ABNORMAL HIGH (ref 70–99)
Potassium: 3.4 mmol/L — ABNORMAL LOW (ref 3.5–5.1)
Sodium: 145 mmol/L (ref 135–145)
Total Bilirubin: 0.7 mg/dL (ref 0.3–1.2)
Total Protein: 6.6 g/dL (ref 6.5–8.1)

## 2019-11-10 LAB — CBC WITH DIFFERENTIAL/PLATELET
Abs Immature Granulocytes: 0.16 10*3/uL — ABNORMAL HIGH (ref 0.00–0.07)
Basophils Absolute: 0 10*3/uL (ref 0.0–0.1)
Basophils Relative: 0 %
Eosinophils Absolute: 0 10*3/uL (ref 0.0–0.5)
Eosinophils Relative: 0 %
HCT: 30.5 % — ABNORMAL LOW (ref 36.0–46.0)
Hemoglobin: 9.1 g/dL — ABNORMAL LOW (ref 12.0–15.0)
Immature Granulocytes: 3 %
Lymphocytes Relative: 5 %
Lymphs Abs: 0.3 10*3/uL — ABNORMAL LOW (ref 0.7–4.0)
MCH: 28.7 pg (ref 26.0–34.0)
MCHC: 29.8 g/dL — ABNORMAL LOW (ref 30.0–36.0)
MCV: 96.2 fL (ref 80.0–100.0)
Monocytes Absolute: 0.3 10*3/uL (ref 0.1–1.0)
Monocytes Relative: 4 %
Neutro Abs: 5.7 10*3/uL (ref 1.7–7.7)
Neutrophils Relative %: 88 %
Platelets: 164 10*3/uL (ref 150–400)
RBC: 3.17 MIL/uL — ABNORMAL LOW (ref 3.87–5.11)
RDW: 18.4 % — ABNORMAL HIGH (ref 11.5–15.5)
WBC: 6.4 10*3/uL (ref 4.0–10.5)
nRBC: 1.1 % — ABNORMAL HIGH (ref 0.0–0.2)

## 2019-11-10 LAB — GLUCOSE, CAPILLARY
Glucose-Capillary: 111 mg/dL — ABNORMAL HIGH (ref 70–99)
Glucose-Capillary: 143 mg/dL — ABNORMAL HIGH (ref 70–99)
Glucose-Capillary: 144 mg/dL — ABNORMAL HIGH (ref 70–99)
Glucose-Capillary: 156 mg/dL — ABNORMAL HIGH (ref 70–99)

## 2019-11-10 LAB — D-DIMER, QUANTITATIVE: D-Dimer, Quant: 2.51 ug/mL-FEU — ABNORMAL HIGH (ref 0.00–0.50)

## 2019-11-10 LAB — C-REACTIVE PROTEIN: CRP: 7.1 mg/dL — ABNORMAL HIGH (ref <1.0)

## 2019-11-10 LAB — MAGNESIUM: Magnesium: 1.9 mg/dL (ref 1.7–2.4)

## 2019-11-10 MED ORDER — DEXAMETHASONE SODIUM PHOSPHATE 4 MG/ML IJ SOLN: 2.0000 mg | INTRAMUSCULAR | Status: DC

## 2019-11-10 MED ORDER — POTASSIUM CHLORIDE CRYS ER 20 MEQ PO TBCR
40.0000 meq | EXTENDED_RELEASE_TABLET | Freq: Once | ORAL | Status: AC
  Administered 2019-11-10: 40 meq via ORAL
  Filled 2019-11-10: qty 2

## 2019-11-10 NOTE — Progress Notes (Signed)
PROGRESS NOTE                                                                                                                                                                                                             Patient Demographics:    Vanessa [name], is a 64 y.o. female, DOB - August 01, 1955, ZHY:[id number]  Admit date - 11/05/2019   Admitting Physician Mckinley Jewel, MD  Outpatient Primary MD for the patient is Maren Reamer, MD (Inactive)  LOS - 4  CC - fever     Brief Narrative  Vanessa [name] is a 64 y.o. female with medical history significant of hypertension, diabetes, hyperlipidemia, grade 2 diastolic congestive heart failure, CKD stage IIIb, endometrial cancer presents to emergency department due to generalized weakness and lethargy, she was diagnosed with COVID-19 pneumonia with sepsis and hypothermia and admitted to the hospital.    She was recently hospitalized for MSSA bacteremia few weeks ago.  She also carries a history of endometrial cancer under the care of Dr. Berline Lopes GYN oncology deemed a poor surgical candidate for now, also recent GI bleed due to duodenal ulcer.   Subjective:   Patient in bed, appears comfortable, denies any headache, no fever, no chest pain or pressure, no shortness of breath , no abdominal pain. No focal weakness.    Assessment  & Plan :     1.  Sepsis.  Etiology not entirely clear, she is hypothermic without leukocytosis, does have COVID-19 infection and mild pneumonia and could be septic from it however she recently had MSSA bacteremia few weeks ago.  At this point repeat blood cultures will be ordered, we will trend her procalcitonin and blood cultures closely on antibiotics.  Complete her remdesivir course and start tapering steroids.  Unremarkable CT chest abdomen pelvis noncontrast and lower extremity leg ultrasound ruling out DVT.  Overall sepsis pathophysiology seems to have resolved, continue treatment for COVID-19  infection along with empiric antibiotics and monitor cultures closely.  2.  Endometrial cancer with intermittent bleeding and spotting.  Stable H&H, will type screen, outpatient follow-up with [facility] oncologist Dr. Berline Lopes once discharged.  3.  History of duodenal ulcer with GI bleed.  Oral PPI, SCDs for now and monitor.  Follow with primary gastroenterologist post discharge.  4.  Borderline elevated D-dimer.  Could be due to her endometrial bleed history, negative lower extremity ultrasound.  Tough situation due to recent history of upper GI bleed with ongoing intermittent bleed from her vagina.  Avoiding chemical anticoagulation for now.  5.  Hypertension.  Continue combination of Norvasc, Coreg, increased hydralazine dose for better control.  Tapering steroids hopefully will help with blood pressure.  6.  Intravascular dehydration with hypernatremia.  Gentle D5W and monitor.  7.  Hypothermia.  Likely due to sepsis.  Stable TSH and random cortisol, improved after supportive care will monitor via rectal probe.    8.  Leg edema mostly nonpitting.  Monitor.    9.  MIcrocytic iron deficiency anemia.  On oral iron supplementation likely due to ongoing intermittent vaginal blood loss, outpatient follow-up with PCP and GYN oncology.  10. DM type II.  ISS.  Follow closely blood sugars as she is now on D5W and Decadron.  May require Lantus and adjustment of short-acting insulin.  Lab Results  Component Value Date   HGBA1C 5.7 (H) 09/15/2019   CBG (last 3)  Recent Labs    11/09/19 1654 11/09/19 2057 11/10/19 0753  GLUCAP 185* 175* 143*      Condition.  Extremely guarded.  Explained to patient and husband.    Family Communication  :  husband Heaton [phone] on 11/07/19  Code Status :  Full  Disposition Plan  :    Status is: Inpatient  Remains inpatient appropriate because:Hemodynamically unstable   Dispo: The patient is from: SNF              Anticipated d/c is to: SNF               Anticipated d/c date is: > 3 days              Patient currently is not medically stable to d/c.   Consults  :  None  Procedures  :    DVT Prophylaxis  :  SCDs    Lab Results  Component Value Date   PLT 164 11/10/2019    Diet :  Diet Order            Diet heart healthy/carb modified Room service appropriate? Yes; Fluid consistency: Thin  Diet effective now               Inpatient Medications Scheduled Meds: . albuterol  2 puff Inhalation Q4H  . amLODipine  10 mg Oral Daily  . vitamin C  500 mg Oral Daily  . carvedilol  12.5 mg Oral BID WC  . Chlorhexidine Gluconate Cloth  6 each Topical Daily  . [START ON 11/11/2019] dexamethasone (DECADRON) injection  2 mg Intravenous Q24H  . feeding supplement (PRO-STAT SUGAR FREE 64)  30 mL Oral TID WC  . ferrous sulfate  325 mg Oral TID WC  . hydrALAZINE  100 mg Oral Q8H  . insulin aspart  0-15 Units Subcutaneous TID WC  . insulin aspart  0-5 Units Subcutaneous QHS  . mouth rinse  15 mL Mouth Rinse BID  . pantoprazole sodium  40 mg Oral Daily  . polyethylene glycol  17 g Oral Daily  . potassium chloride  40 mEq Oral Once  . sodium chloride flush  3 mL Intravenous Once  . zinc sulfate  220 mg Oral Daily   Continuous Infusions: . piperacillin-tazobactam (ZOSYN)  IV 3.375 g (11/10/19 0406)  . remdesivir 100 mg in NS 100 mL 100 mg (11/10/19 1119)   PRN Meds:.acetaminophen, guaiFENesin-dextromethorphan, loratadine, [DISCONTINUED] ondansetron **OR** ondansetron (ZOFRAN) IV, sodium chloride flush  Antibiotics  :   Anti-infectives (From admission, onward)   Start     Dose/Rate Route Frequency Ordered  Stop   11/09/19 1230  vancomycin (VANCOREADY) IVPB 1250 mg/250 mL  Status:  Discontinued     1,250 mg 166.7 mL/hr over 90 Minutes Intravenous Every 48 hours 11/07/19 1100 11/09/19 1318   11/08/19 1000  remdesivir 100 mg in sodium chloride 0.9 % 100 mL IVPB     100 mg 200 mL/hr over 30 Minutes Intravenous Daily 11/07/19 1217  11/12/19 0959   11/07/19 1600  piperacillin-tazobactam (ZOSYN) IVPB 3.375 g     3.375 g 12.5 mL/hr over 240 Minutes Intravenous Every 8 hours 11/07/19 1033     11/07/19 1245  remdesivir 200 mg in sodium chloride 0.9% 250 mL IVPB     200 mg 580 mL/hr over 30 Minutes Intravenous Once 11/07/19 1216 11/07/19 1728   11/07/19 1130  vancomycin (VANCOREADY) IVPB 2000 mg/400 mL     2,000 mg 200 mL/hr over 120 Minutes Intravenous  Once 11/07/19 1053 11/07/19 1432   11/07/19 1100  piperacillin-tazobactam (ZOSYN) IVPB 3.375 g     3.375 g 100 mL/hr over 30 Minutes Intravenous  Once 11/07/19 1033 11/07/19 1303          Objective:   Vitals:   11/09/19 2005 11/10/19 0000 11/10/19 0400 11/10/19 1107  BP: (!) 164/81  (!) 177/85 (!) 189/89  Pulse: (!) 57   64  Resp: 19     Temp: (!) 94.4 F (34.7 C) (!) 95.7 F (35.4 C) (!) 97.2 F (36.2 C)   TempSrc: Rectal Rectal Rectal   SpO2: 91%     Weight:      Height:        SpO2: 91 % O2 Flow Rate (L/min): 2 L/min  Wt Readings from Last 3 Encounters:  11/08/19 104.9 kg  10/18/19 100.3 kg  10/17/19 94.9 kg     Intake/Output Summary (Last 24 hours) at 11/10/2019 1125 Last data filed at 11/10/2019 0110 Gross per 24 hour  Intake 624.41 ml  Output 1150 ml  Net -525.59 ml     Physical Exam  Awake Alert, No new F.N deficits, Normal affect [place].AT,PERRAL Supple Neck,No JVD, No cervical lymphadenopathy appriciated.  Symmetrical Chest wall movement, Good air movement bilaterally, CTAB RRR,No Gallops, Rubs or new Murmurs, No Parasternal Heave +ve B.Sounds, Abd Soft, No tenderness, No organomegaly appriciated, No rebound - guarding or rigidity. No Cyanosis, Clubbing or edema, No new Rash or bruise     Data Review:    Recent Labs  Lab 11/05/19 2129 11/06/19 2140 11/07/19 0441 11/08/19 0437 11/09/19 0430 11/10/19 0410  WBC 5.4  --  5.0 4.5 4.3 6.4  HGB 9.0*  --  8.9* 8.5* 8.6* 9.1*  HCT 30.5* 29.5* 29.7* 29.4* 29.0* 30.5*  PLT 180  --   153 142* 144* 164  MCV 100.3*  --  99.7 101.4* 97.6 96.2  MCH 29.6  --  29.9 29.3 29.0 28.7  MCHC 29.5*  --  30.0 28.9* 29.7* 29.8*  RDW 19.1*  --  18.6* 18.5* 18.5* 18.4*  LYMPHSABS 0.3*  --  0.3* 0.2* 0.3* 0.3*  MONOABS 0.4  --  0.2 0.1 0.3 0.3  EOSABS 0.0  --  0.0 0.0 0.0 0.0  BASOSABS 0.0  --  0.0 0.0 0.0 0.0    Recent Labs  Lab 11/06/19 2140 11/07/19 0441 11/08/19 0437 11/08/19 0500 11/09/19 0430 11/10/19 0410  NA 148* 149* 147*  --  148* 145  K 3.5 3.2* 3.9  --  3.6 3.4*  CL 112* 112* 111  --  111 108  CO2 26 26 25   --  25 26  GLUCOSE 109* 110* 188*  --  134* 169*  BUN 28* 28* 31*  --  37* 41*  CREATININE 1.86* 1.76* 1.88*  --  1.93* 1.85*  CALCIUM 8.7* 8.5* 8.4*  --  8.8* 8.5*  AST 18 15 14*  --  15 17  ALT 5 6 7   --  5 6  ALKPHOS 113 103 105  --  100 91  BILITOT 0.6 0.8 0.8  --  0.7 0.7  ALBUMIN 2.8* 2.6* 2.4*  --  2.4* 2.4*  MG  --  1.8 1.9  --  1.8 1.9  CRP 8.7* 8.5* 11.2*  --  9.9* 7.1*  DDIMER 1.54* 1.31* 1.34*  --  2.00* 2.51*  PROCALCITON 0.46 <0.10 <0.10  --  <0.10  --   INR  --   --  1.2  --   --   --   TSH 5.158*  --   --   --   --   --   BNP 656.0*  --   --  573.1* 1,043.9*  --     Recent Labs  Lab 11/06/19 1054 11/06/19 2140 11/07/19 0441 11/08/19 0437 11/08/19 0500 11/09/19 0430 11/10/19 0410  CRP  --  8.7* 8.5* 11.2*  --  9.9* 7.1*  DDIMER  --  1.54* 1.31* 1.34*  --  2.00* 2.51*  BNP  --  656.0*  --   --  573.1* 1,043.9*  --   PROCALCITON  --  0.46 <0.10 <0.10  --  <0.10  --   SARSCOV2NAA POSITIVE*  --   --   --   --   --   --     ------------------------------------------------------------------------------------------------------------------ No results for input(s): CHOL, HDL, LDLCALC, TRIG, CHOLHDL, LDLDIRECT in the last 72 hours.  Lab Results  Component Value Date   HGBA1C 5.7 (H) 09/15/2019   ------------------------------------------------------------------------------------------------------------------ No results for  input(s): TSH, T4TOTAL, T3FREE, THYROIDAB in the last 72 hours.  Invalid input(s): FREET3 ------------------------------------------------------------------------------------------------------------------ Recent Labs    11/08/19 1145  VITAMINB12 640  FOLATE 11.4  FERRITIN 170  TIBC 209*  IRON 20*  RETICCTPCT 1.2    Coagulation profile Recent Labs  Lab 11/08/19 0437  INR 1.2    Recent Labs    11/09/19 0430 11/10/19 0410  DDIMER 2.00* 2.51*    Cardiac Enzymes No results for input(s): CKMB, TROPONINI, MYOGLOBIN in the last 168 hours.  Invalid input(s): CK ------------------------------------------------------------------------------------------------------------------    Component Value Date/Time   BNP 1,043.9 (H) 11/09/2019 0430    Micro Results Recent Results (from the past 240 hour(s))  Respiratory Panel by RT PCR (Flu A&B, Covid) - Nasopharyngeal Swab     Status: Abnormal   Collection Time: 11/06/19 10:54 AM   Specimen: Nasopharyngeal Swab  Result Value Ref Range Status   SARS Coronavirus 2 by RT PCR POSITIVE (A) NEGATIVE Final    Comment: RESULT CALLED TO, READ BACK BY AND VERIFIED WITH: Marzella Schlein RN 12:35 11/06/19 ([email or web address]) (NOTE) SARS-CoV-2 target nucleic acids are DETECTED. SARS-CoV-2 RNA is generally detectable in upper respiratory specimens  during the acute phase of infection. Positive results are indicative of the presence of the identified virus, but do not rule out bacterial infection or co-infection with other pathogens not detected by the test. Clinical correlation with patient history and other diagnostic information is necessary to determine patient infection status. The expected result is Negative. Fact Sheet for Patients:  PinkCheek.be Fact Sheet for Healthcare Providers: GravelBags.it  This test is not yet approved or cleared by the [place] and  has been authorized for  detection and/or diagnosis of SARS-CoV-2 by FDA under an Emergency Use Authorization (EUA).  This EUA will remain in effect (meaning this test can be used)  for the duration of  the COVID-19 declaration under Section 564(b)(1) of the Act, 21 U.S.C. section 360bbb-3(b)(1), unless the authorization is terminated or revoked sooner.    Influenza A by PCR NEGATIVE NEGATIVE Final   Influenza B by PCR NEGATIVE NEGATIVE Final    Comment: (NOTE) The Xpert Xpress SARS-CoV-2/FLU/RSV assay is intended as an aid in  the diagnosis of influenza from Nasopharyngeal swab specimens and  should not be used as a sole basis for treatment. Nasal washings and  aspirates are unacceptable for Xpert Xpress SARS-CoV-2/FLU/RSV  testing. Fact Sheet for Patients: PinkCheek.be Fact Sheet for Healthcare Providers: GravelBags.it This test is not yet approved or cleared by the [place] FDA and  has been authorized for detection and/or diagnosis of SARS-CoV-2 by  FDA under an Emergency Use Authorization (EUA). This EUA will remain  in effect (meaning this test can be used) for the duration of the  Covid-19 declaration under Section 564(b)(1) of the Act, 21  U.S.C. section 360bbb-3(b)(1), unless the authorization is  terminated or revoked. Performed at [facility], [place] [address] [place], [place] [address]   Culture, blood (Routine X 2) w Reflex to ID Panel     Status: None (Preliminary result)   Collection Time: 11/06/19  9:40 PM   Specimen: BLOOD  Result Value Ref Range Status   Specimen Description BLOOD RIGHT ANTECUBITAL  Final   Special Requests   Final    BOTTLES DRAWN AEROBIC AND ANAEROBIC Blood Culture adequate volume   Culture   Final    NO GROWTH 4 DAYS Performed at [facility], [place] [address], [place], [place] [address]    Report Status PENDING  Incomplete  Culture, blood (Routine X 2) w Reflex to ID Panel     Status: None  (Preliminary result)   Collection Time: 11/06/19  9:49 PM   Specimen: BLOOD  Result Value Ref Range Status   Specimen Description BLOOD LEFT ANTECUBITAL  Final   Special Requests   Final    BOTTLES DRAWN AEROBIC ONLY Blood Culture adequate volume   Culture   Final    NO GROWTH 4 DAYS Performed at [facility], [place] [address] [place], [place] [address]    Report Status PENDING  Incomplete    Radiology Reports CT ABDOMEN PELVIS WO CONTRAST  Result Date: 11/07/2019 CLINICAL DATA:  Generalized weakness and lethargy. Sepsis. Ascites. Cough. COVID positive yesterday. EXAM: CT CHEST, ABDOMEN AND PELVIS WITHOUT CONTRAST TECHNIQUE: Multidetector CT imaging of the chest, abdomen and pelvis was performed following the standard protocol without IV contrast. COMPARISON:  Radiograph yesterday. Abdominopelvic CT 09/15/2019 FINDINGS: CT CHEST FINDINGS Cardiovascular: Right upper extremity PICC with tip in the SVC. Motion artifact partially obscures detailed evaluation. Normal caliber thoracic aorta. Mild atherosclerosis. Mild cardiomegaly. No pericardial effusion. Mediastinum/Nodes: Motion and lack of contrast limits assessment for adenopathy. Small mediastinal lymph nodes not definitively enlarged. Assessment for hilar adenopathy is significantly limited. Esophagus is patulous. No suspicious thyroid nodule. Lungs/Pleura: Moderate right and small left pleural effusion. Adjacent compressive atelectasis. There are multifocal geographic patchy ground-glass opacities throughout both lungs. Trachea and mainstem bronchi are grossly patent, partially motion obscured. Musculoskeletal: Generalized subcutaneous edema throughout the chest wall.  Diffuse degenerative change throughout the thoracic spine. There are no acute or suspicious osseous abnormalities. CT ABDOMEN PELVIS FINDINGS Hepatobiliary: No evidence of focal hepatic abnormality on noncontrast exam. Liver is prominent size spanning 20 cm cranial caudal.  Question of nodular hepatic contours. Hyperdense gallbladder contents. No calcified gallstone. Common bile duct is not well-defined. Pancreas: No pancreatic ductal dilatation. No disproportionate peripancreatic edema. Spleen: Normal in size without focal abnormality. Adrenals/Urinary Tract: Adrenal thickening without dominant nodule. Chronic left renal atrophy. Left ureter is nondilated, unchanged 7 mm calcification in the left pelvis equivocally within the ureter. Right kidney appears normal in size. No hydronephrosis or renal calculi. Urinary bladder is distended and unremarkable. Stomach/Bowel: Bowel evaluation is limited in the absence of contrast, presence of ascites, and motion artifact. Nondistended stomach, grossly unremarkable. No small bowel obstruction. Appendix not visualized. Moderate volume of stool throughout the colon. There is mild colonic redundancy. No obvious colonic wall thickening or pericolonic inflammation. Vascular/Lymphatic: Abdominal aorta is normal in caliber. No bulky abdominopelvic adenopathy. Reproductive: Uterus is slightly bulky. Limited assessment for adnexal mass given pelvic ascites. Other: Small to moderate volume abdominopelvic ascites. There is generalized edema of the intra-abdominal fat. Prominent generalized edema of the subcutaneous fat, confluent in the flanks. No free intra-abdominal air. Musculoskeletal: Degenerative change in the lumbar spine. There are no acute or suspicious osseous abnormalities. IMPRESSION: 1. Multifocal geographic patchy ground-glass opacities throughout both lungs, consistent with COVID-19 pneumonia. 2. Fluid overload. Moderate right and small left pleural effusion. Small to moderate volume abdominopelvic ascites. Generalized subcutaneous edema of the chest and abdomen consistent with third-spacing, confluent edema in the flanks. 3. Question of nodular hepatic contours, recommend correlation for cirrhosis. 4. Chronic left renal atrophy. Unchanged 7  mm calcification in the left pelvis equivocally within the ureter. No hydronephrosis. Aortic Atherosclerosis (ICD10-I70.0). Electronically Signed   By: Keith Rake M.D.   On: 11/07/2019 23:00   CT CHEST WO CONTRAST  Result Date: 11/07/2019 CLINICAL DATA:  Generalized weakness and lethargy. Sepsis. Ascites. Cough. COVID positive yesterday. EXAM: CT CHEST, ABDOMEN AND PELVIS WITHOUT CONTRAST TECHNIQUE: Multidetector CT imaging of the chest, abdomen and pelvis was performed following the standard protocol without IV contrast. COMPARISON:  Radiograph yesterday. Abdominopelvic CT 09/15/2019 FINDINGS: CT CHEST FINDINGS Cardiovascular: Right upper extremity PICC with tip in the SVC. Motion artifact partially obscures detailed evaluation. Normal caliber thoracic aorta. Mild atherosclerosis. Mild cardiomegaly. No pericardial effusion. Mediastinum/Nodes: Motion and lack of contrast limits assessment for adenopathy. Small mediastinal lymph nodes not definitively enlarged. Assessment for hilar adenopathy is significantly limited. Esophagus is patulous. No suspicious thyroid nodule. Lungs/Pleura: Moderate right and small left pleural effusion. Adjacent compressive atelectasis. There are multifocal geographic patchy ground-glass opacities throughout both lungs. Trachea and mainstem bronchi are grossly patent, partially motion obscured. Musculoskeletal: Generalized subcutaneous edema throughout the chest wall. Diffuse degenerative change throughout the thoracic spine. There are no acute or suspicious osseous abnormalities. CT ABDOMEN PELVIS FINDINGS Hepatobiliary: No evidence of focal hepatic abnormality on noncontrast exam. Liver is prominent size spanning 20 cm cranial caudal. Question of nodular hepatic contours. Hyperdense gallbladder contents. No calcified gallstone. Common bile duct is not well-defined. Pancreas: No pancreatic ductal dilatation. No disproportionate peripancreatic edema. Spleen: Normal in size  without focal abnormality. Adrenals/Urinary Tract: Adrenal thickening without dominant nodule. Chronic left renal atrophy. Left ureter is nondilated, unchanged 7 mm calcification in the left pelvis equivocally within the ureter. Right kidney appears normal in size. No hydronephrosis or renal calculi. Urinary bladder is distended and unremarkable. Stomach/Bowel:  Bowel evaluation is limited in the absence of contrast, presence of ascites, and motion artifact. Nondistended stomach, grossly unremarkable. No small bowel obstruction. Appendix not visualized. Moderate volume of stool throughout the colon. There is mild colonic redundancy. No obvious colonic wall thickening or pericolonic inflammation. Vascular/Lymphatic: Abdominal aorta is normal in caliber. No bulky abdominopelvic adenopathy. Reproductive: Uterus is slightly bulky. Limited assessment for adnexal mass given pelvic ascites. Other: Small to moderate volume abdominopelvic ascites. There is generalized edema of the intra-abdominal fat. Prominent generalized edema of the subcutaneous fat, confluent in the flanks. No free intra-abdominal air. Musculoskeletal: Degenerative change in the lumbar spine. There are no acute or suspicious osseous abnormalities. IMPRESSION: 1. Multifocal geographic patchy ground-glass opacities throughout both lungs, consistent with COVID-19 pneumonia. 2. Fluid overload. Moderate right and small left pleural effusion. Small to moderate volume abdominopelvic ascites. Generalized subcutaneous edema of the chest and abdomen consistent with third-spacing, confluent edema in the flanks. 3. Question of nodular hepatic contours, recommend correlation for cirrhosis. 4. Chronic left renal atrophy. Unchanged 7 mm calcification in the left pelvis equivocally within the ureter. No hydronephrosis. Aortic Atherosclerosis (ICD10-I70.0). Electronically Signed   By: Keith Rake M.D.   On: 11/07/2019 23:00   DG Chest Port 1 View  Result Date:  11/09/2019 CLINICAL DATA:  Weakness, COVID-19 positive EXAM: PORTABLE CHEST 1 VIEW COMPARISON:  Chest radiograph from one day prior. FINDINGS: Right PICC terminates in the lower third of the SVC. Stable cardiomediastinal silhouette with mild cardiomegaly. No pneumothorax. No pleural effusion. Extensive patchy opacities throughout both lungs, slightly worsened in the upper lungs. IMPRESSION: Extensive patchy opacities throughout both lungs, slightly worsened in the upper lungs, compatible with COVID-19 pneumonia, although with component of pulmonary edema not excluded. Mild cardiomegaly. Electronically Signed   By: Ilona Sorrel M.D.   On: 11/09/2019 08:19   DG Chest Port 1 View  Result Date: 11/08/2019 CLINICAL DATA:  Shortness of breath, COVID-19, diabetes mellitus, hypertension, stage III chronic kidney disease, history endometrial cancer EXAM: PORTABLE CHEST 1 VIEW COMPARISON:  Portable exam 0816 hours compared to 11/06/2019 FINDINGS: RIGHT arm PICC line tip projects over SVC. Enlargement of cardiac silhouette with stable mediastinal contours. Extensive BILATERAL pulmonary infiltrates greatest perihilar on RIGHT, question asymmetric pulmonary edema versus multifocal infection. No pleural effusion or pneumothorax. Bones demineralized with scattered degenerative changes of the thoracic spine. IMPRESSION: Enlargement of cardiac silhouette with increased BILATERAL pulmonary infiltrates greatest RIGHT perihilar question asymmetric pulmonary edema versus multifocal pneumonia. Electronically Signed   By: Lavonia Dana M.D.   On: 11/08/2019 08:51   DG CHEST PORT 1 VIEW  Result Date: 11/06/2019 CLINICAL DATA:  Weakness. EXAM: PORTABLE CHEST 1 VIEW COMPARISON:  November 05, 2019. FINDINGS: Stable cardiomegaly with possible central pulmonary vascular congestion. No pneumothorax is noted. Mild bibasilar atelectasis is noted with small pleural effusions. Bony thorax is unremarkable. IMPRESSION: Stable cardiomegaly with  possible central pulmonary vascular congestion. Mild bibasilar subsegmental atelectasis is noted with small pleural effusions. Electronically Signed   By: Marijo Conception M.D.   On: 11/06/2019 15:57   DG Chest Port 1 View  Result Date: 11/05/2019 CLINICAL DATA:  Weakness. Found on floor today. EXAM: PORTABLE CHEST 1 VIEW COMPARISON:  10/18/2019 FINDINGS: Stable heart size and mediastinal contours with mild cardiomegaly. Hazy opacity at both lung bases likely combination of pleural fluid and airspace disease/atelectasis. Vascular congestion. No pneumothorax. Left proximal humerus fracture appears subacute with surrounding heterotopic ossification, also seen on 09/17/2019 shoulder radiograph. IMPRESSION: 1. Hazy opacity at both  lung bases likely combination of pleural fluid and airspace disease/atelectasis. This is stable on the right but progressive on the left from prior. 2. Stable mild cardiomegaly.  Vascular congestion. Electronically Signed   By: Keith Rake M.D.   On: 11/05/2019 17:02   DG Chest Portable 1 View  Result Date: 10/18/2019 CLINICAL DATA:  Hypertension and shortness of breath EXAM: PORTABLE CHEST 1 VIEW COMPARISON:  September 17, 2019 FINDINGS: Ill-defined opacity in the right mid and lower lung regions noted with suspected small superimposed pleural effusion. There is mild left base atelectasis. Left lung otherwise clear. Heart is mildly enlarged with pulmonary vascularity normal. No adenopathy. There is degenerative change in the thoracic spine. IMPRESSION: Ill-defined opacity right mid and lower lung zones, likely due to a degree of pneumonia with superimposed small pleural effusion. Mild left base atelectasis. Stable cardiac prominence. No adenopathy evident. Electronically Signed   By: Lowella Grip III M.D.   On: 10/18/2019 14:06   VAS [place] LOWER EXTREMITY VENOUS (DVT)  Result Date: 11/07/2019  Lower Venous DVTStudy Indications: Swelling, and Edema.  Limitations: Body habitus and  poor ultrasound/tissue interface. Comparison Study: 09/16/19 previous Performing Technologist: Abram Sander RVS  Examination Guidelines: A complete evaluation includes B-mode imaging, spectral Doppler, color Doppler, and power Doppler as needed of all accessible portions of each vessel. Bilateral testing is considered an integral part of a complete examination. Limited examinations for reoccurring indications may be performed as noted. The reflux portion of the exam is performed with the patient in reverse Trendelenburg.  +---------+---------------+---------+-----------+----------+--------------+ RIGHT    CompressibilityPhasicitySpontaneityPropertiesThrombus Aging +---------+---------------+---------+-----------+----------+--------------+ CFV      Full           Yes      Yes                                 +---------+---------------+---------+-----------+----------+--------------+ SFJ      Full                                                        +---------+---------------+---------+-----------+----------+--------------+ FV Prox  Full                                                        +---------+---------------+---------+-----------+----------+--------------+ FV Mid                                                Not visualized +---------+---------------+---------+-----------+----------+--------------+ FV Distal               Yes      Yes                                 +---------+---------------+---------+-----------+----------+--------------+ PFV      Full                                                        +---------+---------------+---------+-----------+----------+--------------+  POP      Full           Yes      Yes                                 +---------+---------------+---------+-----------+----------+--------------+ PTV                                                   Not visualized  +---------+---------------+---------+-----------+----------+--------------+ PERO                                                  Not visualized +---------+---------------+---------+-----------+----------+--------------+   +---------+---------------+---------+-----------+----------+--------------+ LEFT     CompressibilityPhasicitySpontaneityPropertiesThrombus Aging +---------+---------------+---------+-----------+----------+--------------+ CFV      Full           Yes      Yes                                 +---------+---------------+---------+-----------+----------+--------------+ SFJ      Full                                                        +---------+---------------+---------+-----------+----------+--------------+ FV Prox  Full                                                        +---------+---------------+---------+-----------+----------+--------------+ FV Mid                  Yes      Yes                                 +---------+---------------+---------+-----------+----------+--------------+ FV Distal               Yes      Yes                                 +---------+---------------+---------+-----------+----------+--------------+ PFV      Full                                                        +---------+---------------+---------+-----------+----------+--------------+ POP      Full           Yes      Yes                                 +---------+---------------+---------+-----------+----------+--------------+ PTV      Full                                                        +---------+---------------+---------+-----------+----------+--------------+  PERO                                                  Not visualized +---------+---------------+---------+-----------+----------+--------------+     Summary: BILATERAL: - No evidence of deep vein thrombosis seen in the lower extremities, bilaterally.   *See table(s)  above for measurements and observations. Electronically signed by Curt Jews MD on 11/07/2019 at 4:26:57 PM.    Final    [place] EKG SITE RITE  Result Date: 11/07/2019 If Site Rite image not attached, placement could not be confirmed due to current cardiac rhythm.   Time Spent in minutes  30   Lala Lund M.D on 11/10/2019 at 11:25 AM  To page go to [email or web address] - password [facility]

## 2019-11-11 LAB — C-REACTIVE PROTEIN: CRP: 5.5 mg/dL — ABNORMAL HIGH (ref <1.0)

## 2019-11-11 LAB — CBC WITH DIFFERENTIAL/PLATELET
Abs Immature Granulocytes: 0.19 10*3/uL — ABNORMAL HIGH (ref 0.00–0.07)
Basophils Absolute: 0 10*3/uL (ref 0.0–0.1)
Basophils Relative: 0 %
Eosinophils Absolute: 0 10*3/uL (ref 0.0–0.5)
Eosinophils Relative: 0 %
HCT: 31.2 % — ABNORMAL LOW (ref 36.0–46.0)
Hemoglobin: 9.4 g/dL — ABNORMAL LOW (ref 12.0–15.0)
Immature Granulocytes: 3 %
Lymphocytes Relative: 4 %
Lymphs Abs: 0.3 10*3/uL — ABNORMAL LOW (ref 0.7–4.0)
MCH: 29.5 pg (ref 26.0–34.0)
MCHC: 30.1 g/dL (ref 30.0–36.0)
MCV: 97.8 fL (ref 80.0–100.0)
Monocytes Absolute: 0.2 10*3/uL (ref 0.1–1.0)
Monocytes Relative: 2 %
Neutro Abs: 6.6 10*3/uL (ref 1.7–7.7)
Neutrophils Relative %: 91 %
Platelets: 145 10*3/uL — ABNORMAL LOW (ref 150–400)
RBC: 3.19 MIL/uL — ABNORMAL LOW (ref 3.87–5.11)
RDW: 18.3 % — ABNORMAL HIGH (ref 11.5–15.5)
WBC: 7.3 10*3/uL (ref 4.0–10.5)
nRBC: 0.5 % — ABNORMAL HIGH (ref 0.0–0.2)

## 2019-11-11 LAB — CULTURE, BLOOD (ROUTINE X 2)
Culture: NO GROWTH
Culture: NO GROWTH
Special Requests: ADEQUATE
Special Requests: ADEQUATE

## 2019-11-11 LAB — D-DIMER, QUANTITATIVE: D-Dimer, Quant: 2.47 ug/mL-FEU — ABNORMAL HIGH (ref 0.00–0.50)

## 2019-11-11 LAB — COMPREHENSIVE METABOLIC PANEL
ALT: 5 U/L (ref 0–44)
AST: 17 U/L (ref 15–41)
Albumin: 2.4 g/dL — ABNORMAL LOW (ref 3.5–5.0)
Alkaline Phosphatase: 90 U/L (ref 38–126)
Anion gap: 13 (ref 5–15)
BUN: 43 mg/dL — ABNORMAL HIGH (ref 8–23)
CO2: 25 mmol/L (ref 22–32)
Calcium: 8.2 mg/dL — ABNORMAL LOW (ref 8.9–10.3)
Chloride: 110 mmol/L (ref 98–111)
Creatinine, Ser: 1.97 mg/dL — ABNORMAL HIGH (ref 0.44–1.00)
GFR calc Af Amer: 30 mL/min — ABNORMAL LOW (ref >60)
GFR calc non Af Amer: 26 mL/min — ABNORMAL LOW (ref >60)
Glucose, Bld: 171 mg/dL — ABNORMAL HIGH (ref 70–99)
Potassium: 3.5 mmol/L (ref 3.5–5.1)
Sodium: 148 mmol/L — ABNORMAL HIGH (ref 135–145)
Total Bilirubin: 0.7 mg/dL (ref 0.3–1.2)
Total Protein: 6.5 g/dL (ref 6.5–8.1)

## 2019-11-11 LAB — GLUCOSE, CAPILLARY
Glucose-Capillary: 170 mg/dL — ABNORMAL HIGH (ref 70–99)
Glucose-Capillary: 166 mg/dL — ABNORMAL HIGH (ref 70–99)
Glucose-Capillary: 136 mg/dL — ABNORMAL HIGH (ref 70–99)
Glucose-Capillary: 134 mg/dL — ABNORMAL HIGH (ref 70–99)

## 2019-11-11 LAB — MAGNESIUM: Magnesium: 1.9 mg/dL (ref 1.7–2.4)

## 2019-11-11 MED ORDER — DOXYCYCLINE HYCLATE 100 MG PO TABS
100.0000 mg | ORAL_TABLET | Freq: Two times a day (BID) | ORAL | Status: DC
  Administered 2019-11-11 – 2019-11-12 (×3): 100 mg via ORAL
  Filled 2019-11-11 (×3): qty 1

## 2019-11-11 MED ORDER — DEXTROSE 5 % IV SOLN: INTRAVENOUS | Status: AC

## 2019-11-11 MED ORDER — DOXAZOSIN MESYLATE 4 MG PO TABS
4.0000 mg | ORAL_TABLET | Freq: Every day | ORAL | Status: DC
  Administered 2019-11-11 – 2019-11-22 (×12): 4 mg via ORAL
  Filled 2019-11-11 (×13): qty 1

## 2019-11-11 NOTE — Progress Notes (Signed)
Physical Therapy Treatment Patient Details Name: Vanessa [name] MRN: [id number] DOB: 23-Nov-1955 Today's Date: 11/11/2019    History of Present Illness 64 year old female admitted 11/05/19 with inability to care for herself at home. Patient was recently discharged from SNF and has been unable to care for herself since. She was found to be COVID+. PMH includes HTN, DM, CKD, and endometrial cancer.     PT Comments    Patient continues to require two person assist for transfers using Stedy. Improved standing tolerance in [place] today compared to PT evaluation. Patient on 2.5L [place] at start of session. Room air trial but patient desatted on room air to 86% so 2.5L [place] reapplied. Continued recommendation for skilled PT services and discharge to SNF for short term rehabilitation. Patient reports her husband also has cancer and cannot assist her. They have no other friends or family to assist them.   Follow Up Recommendations  SNF;Supervision/Assistance - 24 hour     Equipment Recommendations  Other (comment)(TBD at next level of care)       Precautions / Restrictions Precautions Precautions: Fall;Other (comment) Precaution Comments: RUE PICC Restrictions Weight Bearing Restrictions: No    Mobility  Bed Mobility Overal bed mobility: Needs Assistance Bed Mobility: Supine to Sit  Supine to sit: Mod assist;Max assist;+2 for physical assistance;+2 for safety/equipment;HOB elevated  General bed mobility comments: Cues and hand over hand assist for use of bedrail to assist with bed mobility.  Transfers Overall transfer level: Needs assistance   Transfers: Stand Pivot Transfers Sit to Stand: Mod assist;+2 physical assistance;+2 safety/equipment;From elevated surface  General transfer comment: Transfer EOB>chair with Stedy with 2 person assist.    Balance Overall balance assessment: Needs assistance Sitting-balance support: Feet supported;Bilateral upper extremity supported Sitting  balance-Leahy Scale: Fair   Postural control: Left lateral lean;Other (comment)(sitting EOB) Standing balance support: Bilateral upper extremity supported(knees blocked with use of Stedy)   Standing balance comment: Standing in Stedy approx 2 minutes with contact guard for balance    Cognition Arousal/Alertness: Awake/alert Behavior During Therapy: WFL for tasks assessed/performed    Exercises General Exercises - Lower Extremity Ankle Circles/Pumps: 10 reps;AROM;Other (comment);Both(in recliner chair)    General Comments General comments (skin integrity, edema, etc.): Patient on 2.5L [place]. Room air trial but patient desatted to 86% on room air standing in [place] so [place] reapplied. Oxygen saturation 93% at end of session on 2.5L [place].       Pertinent Vitals/Pain Pain Assessment: No/denies pain(No complaints of pain, no signs/symptoms of pain)           PT Goals (current goals can now be found in the care plan section) Progress towards PT goals: Progressing toward goals    Frequency    Min 2X/week      PT Plan Current plan remains appropriate       AM-PAC PT "6 Clicks" Mobility   Outcome Measure  Help needed turning from your back to your side while in a flat bed without using bedrails?: A Lot Help needed moving from lying on your back to sitting on the side of a flat bed without using bedrails?: A Lot Help needed moving to and from a bed to a chair (including a wheelchair)?: Total Help needed standing up from a chair using your arms (e.g., wheelchair or bedside chair)?: Total Help needed to walk in hospital room?: Total Help needed climbing 3-5 steps with a railing? : Total 6 Click Score: 8    End of Session Equipment  Utilized During Treatment: Oxygen;Gait belt Activity Tolerance: Patient limited by fatigue;Patient tolerated treatment well Patient left: in chair;with call bell/phone within reach;with chair alarm set Nurse Communication: Mobility status PT Visit  Diagnosis: Unsteadiness on feet (R26.81);Difficulty in walking, not elsewhere classified (R26.2);Pain     Time: 8887-5797 PT Time Calculation (min) (ACUTE ONLY): 26 min  Charges:  $Therapeutic Activity: 23-37 mins                     Birdie Hopes, PT, DPT Acute Rehab [phone] office     Birdie Hopes 11/11/2019, 1:08 PM

## 2019-11-11 NOTE — Plan of Care (Signed)
  Problem: Education: Goal: Knowledge of General Education information will improve Description: Including pain rating scale, medication(s)/side effects and non-pharmacologic comfort measures Outcome: Progressing   Problem: Clinical Measurements: Goal: Ability to maintain clinical measurements within normal limits will improve Outcome: Progressing Goal: Will remain free from infection Outcome: Progressing   Problem: Activity: Goal: Risk for activity intolerance will decrease Outcome: Progressing   Problem: Nutrition: Goal: Adequate nutrition will be maintained Outcome: Progressing   Problem: Coping: Goal: Level of anxiety will decrease Outcome: Progressing   Problem: Elimination: Goal: Will not experience complications related to bowel motility Outcome: Progressing Goal: Will not experience complications related to urinary retention Outcome: Progressing   Problem: Pain Managment: Goal: General experience of comfort will improve Outcome: Progressing

## 2019-11-11 NOTE — TOC Progression Note (Signed)
Transition of Care [facility]) - Progression Note    Patient Details  Name: Vanessa [name] MRN: [id number] Date of Birth: [birth year]  [facility] CM/SW [place], LCSW Phone Number: 11/11/2019, 8:28 AM  Clinical Narrative:    CSW will await PT update today in order to fax in clinicals to insurance for review.    Expected Discharge Plan: Skilled Nursing Facility Barriers to Discharge: Continued Medical Work up, Ship broker  Expected Discharge Plan and Services Expected Discharge Plan: [place] In-house Referral: Clinical Social Work Discharge Planning Services: CM Consult Post Acute Care Choice: [place] Living arrangements for the past 2 months: [place]                                       Social Determinants of Health (SDOH) Interventions    Readmission Risk Interventions Readmission Risk Prevention Plan 11/08/2019 10/21/2019 09/25/2019  Transportation Screening Complete Complete Complete  PCP or Specialist Appt within 3-5 Days - - Not Complete  Not Complete comments - - plan for SNF  HRI or [place] - - Complete  Social Work Consult for [place] Planning/Counseling - - Complete  Palliative Care Screening - - Not Applicable  Medication Review Press photographer) Complete Referral to Pharmacy Referral to Pharmacy  PCP or Specialist appointment within 3-5 days of discharge Complete Complete -  [place] or Home Care Consult Complete (No Data) -  SW Recovery Care/Counseling Consult Complete Complete -  Palliative Care Screening Not Applicable Not Applicable -  Skilled Nursing Facility Complete Complete -  Some recent data might be hidden

## 2019-11-11 NOTE — Progress Notes (Signed)
PROGRESS NOTE                                                                                                                                                                                                             Patient Demographics:    Vanessa [name], is a 64 y.o. female, DOB - 1956-05-26, NFA:[id number]  Admit date - 11/05/2019   Admitting Physician Ollen Bowl, MD  Outpatient Primary MD for the patient is Pete Glatter, MD (Inactive)  LOS - 5  CC - fever     Brief Narrative  Vanessa [name] is a 64 y.o. female with medical history significant of hypertension, diabetes, hyperlipidemia, grade 2 diastolic congestive heart failure, CKD stage IIIb, endometrial cancer presents to emergency department due to generalized weakness and lethargy, she was diagnosed with COVID-19 pneumonia with sepsis and hypothermia and admitted to the hospital.    She was recently hospitalized for MSSA bacteremia few weeks ago.  She also carries a history of endometrial cancer under the care of Dr. Pricilla Holm GYN oncology deemed a poor surgical candidate for now, also recent GI bleed due to duodenal ulcer.   Subjective:   Patient in bed, appears comfortable, denies any headache, no fever, no chest pain or pressure, no shortness of breath , no abdominal pain. No focal weakness.   Assessment  & Plan :     1.  Sepsis.  Etiology not entirely clear, she is hypothermic without leukocytosis, does have COVID-19 infection and mild pneumonia and could be septic from it however she recently had MSSA bacteremia few weeks ago.  At this point repeat blood cultures will be ordered, we will trend her procalcitonin and blood cultures closely on antibiotics.  Complete her remdesivir course and start tapering steroids.  Unremarkable CT chest abdomen pelvis noncontrast and lower extremity leg ultrasound ruling out DVT. Overall sepsis pathophysiology seems to have resolved, continue treatment for COVID-19 infection  along with empiric antibiotics and monitor cultures closely.  2.  Endometrial cancer with intermittent bleeding and spotting.  Stable H&H, will type screen, outpatient follow-up with [facility] oncologist Dr. Pricilla Holm once discharged.  3.  History of duodenal ulcer with GI bleed.  Oral PPI, SCDs for now and monitor.  Follow with primary gastroenterologist post discharge.  4.  Borderline elevated D-dimer.  Could be due to her endometrial bleed history, negative lower extremity ultrasound. Tough situation due to recent history of upper GI bleed with ongoing intermittent bleed from her vagina.  Avoiding chemical  anticoagulation for now.  5.  Hypertension.  Continue combination of Norvasc, Coreg, increased hydralazine dose for better control.  Tapering steroids hopefully will help with blood pressure.  6.  Intravascular dehydration with hypernatremia.  Gentle D5W and monitor.  7.  Hypothermia.  Likely due to sepsis.  Stable TSH and random cortisol, improved after supportive care will monitor via rectal probe.    8.  Leg edema mostly nonpitting.  Monitor.    9.  MIcrocytic iron deficiency anemia.  On oral iron supplementation likely due to ongoing intermittent vaginal blood loss, outpatient follow-up with PCP and GYN oncology.  10. DM type II.  ISS.  Follow closely blood sugars as she is now on D5W and Decadron.  May require Lantus and adjustment of short-acting insulin.  Lab Results  Component Value Date   HGBA1C 5.7 (H) 09/15/2019   CBG (last 3)  Recent Labs    11/10/19 2335 11/11/19 0735 11/11/19 1243  GLUCAP 156* 170* 166*      Condition.  Extremely guarded.  Explained to patient and husband.    Family Communication  :  husband Lopinto [phone] on 11/07/19  Code Status :  Full  Disposition Plan  :    Status is: Inpatient  Remains inpatient appropriate because:Hemodynamically unstable   Dispo: The patient is from: SNF              Anticipated d/c is to: SNF               Anticipated d/c date is: > 3 days              Patient currently is not medically stable to d/c.   Consults  :  None  Procedures  :    DVT Prophylaxis  :  SCDs    Lab Results  Component Value Date   PLT 145 (L) 11/11/2019    Diet :  Diet Order            Diet heart healthy/carb modified Room service appropriate? Yes; Fluid consistency: Thin  Diet effective now               Inpatient Medications Scheduled Meds: . albuterol  2 puff Inhalation Q4H  . amLODipine  10 mg Oral Daily  . vitamin C  500 mg Oral Daily  . carvedilol  12.5 mg Oral BID WC  . Chlorhexidine Gluconate Cloth  6 each Topical Daily  . doxazosin  4 mg Oral Daily  . doxycycline  100 mg Oral Q12H  . feeding supplement (PRO-STAT SUGAR FREE 64)  30 mL Oral TID WC  . ferrous sulfate  325 mg Oral TID WC  . hydrALAZINE  100 mg Oral Q8H  . insulin aspart  0-15 Units Subcutaneous TID WC  . insulin aspart  0-5 Units Subcutaneous QHS  . mouth rinse  15 mL Mouth Rinse BID  . pantoprazole sodium  40 mg Oral Daily  . polyethylene glycol  17 g Oral Daily  . zinc sulfate  220 mg Oral Daily   Continuous Infusions: . dextrose 100 mL/hr at 11/11/19 0911   PRN Meds:.acetaminophen, guaiFENesin-dextromethorphan, loratadine, [DISCONTINUED] ondansetron **OR** ondansetron (ZOFRAN) IV, sodium chloride flush  Antibiotics  :   Anti-infectives (From admission, onward)   Start     Dose/Rate Route Frequency Ordered Stop   11/11/19 1000  doxycycline (VIBRA-TABS) tablet 100 mg     100 mg Oral Every 12 hours 11/11/19 0729     11/09/19 1230  vancomycin (VANCOREADY)  IVPB 1250 mg/250 mL  Status:  Discontinued     1,250 mg 166.7 mL/hr over 90 Minutes Intravenous Every 48 hours 11/07/19 1100 11/09/19 1318   11/08/19 1000  remdesivir 100 mg in sodium chloride 0.9 % 100 mL IVPB     100 mg 200 mL/hr over 30 Minutes Intravenous Daily 11/07/19 1217 11/11/19 1054   11/07/19 1600  piperacillin-tazobactam (ZOSYN) IVPB 3.375 g  Status:   Discontinued     3.375 g 12.5 mL/hr over 240 Minutes Intravenous Every 8 hours 11/07/19 1033 11/11/19 0729   11/07/19 1245  remdesivir 200 mg in sodium chloride 0.9% 250 mL IVPB     200 mg 580 mL/hr over 30 Minutes Intravenous Once 11/07/19 1216 11/07/19 1728   11/07/19 1130  vancomycin (VANCOREADY) IVPB 2000 mg/400 mL     2,000 mg 200 mL/hr over 120 Minutes Intravenous  Once 11/07/19 1053 11/07/19 1432   11/07/19 1100  piperacillin-tazobactam (ZOSYN) IVPB 3.375 g     3.375 g 100 mL/hr over 30 Minutes Intravenous  Once 11/07/19 1033 11/07/19 1303          Objective:   Vitals:   11/11/19 0634 11/11/19 0817 11/11/19 0818 11/11/19 1035  BP: (!) 190/91 (!) 186/83    Pulse:  64    Resp:  20    Temp:  (!) 97.4 F (36.3 C)  (!) 97.5 F (36.4 C)  TempSrc:  Oral  Oral  SpO2:  99% 98%   Weight:      Height:        SpO2: 98 % O2 Flow Rate (L/min): 4 L/min  Wt Readings from Last 3 Encounters:  11/08/19 104.9 kg  10/18/19 100.3 kg  10/17/19 94.9 kg     Intake/Output Summary (Last 24 hours) at 11/11/2019 1403 Last data filed at 11/11/2019 1054 Gross per 24 hour  Intake 510.98 ml  Output --  Net 510.98 ml     Physical Exam  Awake Alert, No new F.N deficits  [place].AT,PERRAL Supple Neck,No JVD, No cervical lymphadenopathy appriciated.  Symmetrical Chest wall movement, Good air movement bilaterally, CTAB RRR,No Gallops, Rubs or new Murmurs, No Parasternal Heave +ve B.Sounds, Abd Soft, No tenderness, No organomegaly appriciated, No rebound - guarding or rigidity. No Cyanosis, Clubbing or edema, No new Rash or bruise    Data Review:    Recent Labs  Lab 11/07/19 0441 11/08/19 0437 11/09/19 0430 11/10/19 0410 11/11/19 0338  WBC 5.0 4.5 4.3 6.4 7.3  HGB 8.9* 8.5* 8.6* 9.1* 9.4*  HCT 29.7* 29.4* 29.0* 30.5* 31.2*  PLT 153 142* 144* 164 145*  MCV 99.7 101.4* 97.6 96.2 97.8  MCH 29.9 29.3 29.0 28.7 29.5  MCHC 30.0 28.9* 29.7* 29.8* 30.1  RDW 18.6* 18.5* 18.5* 18.4*  18.3*  LYMPHSABS 0.3* 0.2* 0.3* 0.3* 0.3*  MONOABS 0.2 0.1 0.3 0.3 0.2  EOSABS 0.0 0.0 0.0 0.0 0.0  BASOSABS 0.0 0.0 0.0 0.0 0.0    Recent Labs  Lab 11/06/19 2140 11/06/19 2140 11/07/19 0441 11/08/19 0437 11/08/19 0500 11/09/19 0430 11/10/19 0410 11/11/19 0338  NA 148*   < > 149* 147*  --  148* 145 148*  K 3.5   < > 3.2* 3.9  --  3.6 3.4* 3.5  CL 112*   < > 112* 111  --  111 108 110  CO2 26   < > 26 25  --  25 26 25   GLUCOSE 109*   < > 110* 188*  --  134* 169* 171*  BUN 28*   < > 28* 31*  --  37* 41* 43*  CREATININE 1.86*   < > 1.76* 1.88*  --  1.93* 1.85* 1.97*  CALCIUM 8.7*   < > 8.5* 8.4*  --  8.8* 8.5* 8.2*  AST 18   < > 15 14*  --  15 17 17   ALT 5   < > 6 7  --  5 6 5   ALKPHOS 113   < > 103 105  --  100 91 90  BILITOT 0.6   < > 0.8 0.8  --  0.7 0.7 0.7  ALBUMIN 2.8*   < > 2.6* 2.4*  --  2.4* 2.4* 2.4*  MG  --   --  1.8 1.9  --  1.8 1.9 1.9  CRP 8.7*   < > 8.5* 11.2*  --  9.9* 7.1* 5.5*  DDIMER 1.54*   < > 1.31* 1.34*  --  2.00* 2.51* 2.47*  PROCALCITON 0.46  --  <0.10 <0.10  --  <0.10  --   --   INR  --   --   --  1.2  --   --   --   --   TSH 5.158*  --   --   --   --   --   --   --   BNP 656.0*  --   --   --  573.1* 1,043.9*  --   --    < > = values in this interval not displayed.    Recent Labs  Lab 11/06/19 1054 11/06/19 2140 11/06/19 2140 11/07/19 0441 11/08/19 0437 11/08/19 0500 11/09/19 0430 11/10/19 0410 11/11/19 0338  CRP  --  8.7*   < > 8.5* 11.2*  --  9.9* 7.1* 5.5*  DDIMER  --  1.54*   < > 1.31* 1.34*  --  2.00* 2.51* 2.47*  BNP  --  656.0*  --   --   --  573.1* 1,043.9*  --   --   PROCALCITON  --  0.46  --  <0.10 <0.10  --  <0.10  --   --   SARSCOV2NAA POSITIVE*  --   --   --   --   --   --   --   --    < > = values in this interval not displayed.    ------------------------------------------------------------------------------------------------------------------ No results for input(s): CHOL, HDL, LDLCALC, TRIG, CHOLHDL, LDLDIRECT in the  last 72 hours.  Lab Results  Component Value Date   HGBA1C 5.7 (H) 09/15/2019   ------------------------------------------------------------------------------------------------------------------ No results for input(s): TSH, T4TOTAL, T3FREE, THYROIDAB in the last 72 hours.  Invalid input(s): FREET3 ------------------------------------------------------------------------------------------------------------------ No results for input(s): VITAMINB12, FOLATE, FERRITIN, TIBC, IRON, RETICCTPCT in the last 72 hours.  Coagulation profile Recent Labs  Lab 11/08/19 0437  INR 1.2    Recent Labs    11/10/19 0410 11/11/19 0338  DDIMER 2.51* 2.47*    Cardiac Enzymes No results for input(s): CKMB, TROPONINI, MYOGLOBIN in the last 168 hours.  Invalid input(s): CK ------------------------------------------------------------------------------------------------------------------    Component Value Date/Time   BNP 1,043.9 (H) 11/09/2019 0430    Micro Results Recent Results (from the past 240 hour(s))  Respiratory Panel by RT PCR (Flu A&B, Covid) - Nasopharyngeal Swab     Status: Abnormal   Collection Time: 11/06/19 10:54 AM   Specimen: Nasopharyngeal Swab  Result Value Ref Range Status   SARS Coronavirus 2 by RT PCR POSITIVE (A) NEGATIVE Final  Comment: RESULT CALLED TO, READ BACK BY AND VERIFIED WITH: Donnetta Hail RN 12:35 11/06/19 ([email or web address]) (NOTE) SARS-CoV-2 target nucleic acids are DETECTED. SARS-CoV-2 RNA is generally detectable in upper respiratory specimens  during the acute phase of infection. Positive results are indicative of the presence of the identified virus, but do not rule out bacterial infection or co-infection with other pathogens not detected by the test. Clinical correlation with patient history and other diagnostic information is necessary to determine patient infection status. The expected result is Negative. Fact Sheet for Patients:   https://www.moore.com/ Fact Sheet for Healthcare Providers: https://www.young.biz/ This test is not yet approved or cleared by the [place] FDA and  has been authorized for detection and/or diagnosis of SARS-CoV-2 by FDA under an Emergency Use Authorization (EUA).  This EUA will remain in effect (meaning this test can be used)  for the duration of  the COVID-19 declaration under Section 564(b)(1) of the Act, 21 U.S.C. section 360bbb-3(b)(1), unless the authorization is terminated or revoked sooner.    Influenza A by PCR NEGATIVE NEGATIVE Final   Influenza B by PCR NEGATIVE NEGATIVE Final    Comment: (NOTE) The Xpert Xpress SARS-CoV-2/FLU/RSV assay is intended as an aid in  the diagnosis of influenza from Nasopharyngeal swab specimens and  should not be used as a sole basis for treatment. Nasal washings and  aspirates are unacceptable for Xpert Xpress SARS-CoV-2/FLU/RSV  testing. Fact Sheet for Patients: https://www.moore.com/ Fact Sheet for Healthcare Providers: https://www.young.biz/ This test is not yet approved or cleared by the [place] FDA and  has been authorized for detection and/or diagnosis of SARS-CoV-2 by  FDA under an Emergency Use Authorization (EUA). This EUA will remain  in effect (meaning this test can be used) for the duration of the  Covid-19 declaration under Section 564(b)(1) of the Act, 21  U.S.C. section 360bbb-3(b)(1), unless the authorization is  terminated or revoked. Performed at [facility], [address], [place], [place] [address]   Culture, blood (Routine X 2) w Reflex to ID Panel     Status: None   Collection Time: 11/06/19  9:40 PM   Specimen: BLOOD  Result Value Ref Range Status   Specimen Description BLOOD RIGHT ANTECUBITAL  Final   Special Requests   Final    BOTTLES DRAWN AEROBIC AND ANAEROBIC Blood Culture adequate volume   Culture   Final    NO  GROWTH 5 DAYS Performed at [facility], [address], [place], [place] [address]    Report Status 11/11/2019 FINAL  Final  Culture, blood (Routine X 2) w Reflex to ID Panel     Status: None   Collection Time: 11/06/19  9:49 PM   Specimen: BLOOD  Result Value Ref Range Status   Specimen Description BLOOD LEFT ANTECUBITAL  Final   Special Requests   Final    BOTTLES DRAWN AEROBIC ONLY Blood Culture adequate volume   Culture   Final    NO GROWTH 5 DAYS Performed at [facility], [address], [place], [place] [address]    Report Status 11/11/2019 FINAL  Final    Radiology Reports CT ABDOMEN PELVIS WO CONTRAST  Result Date: 11/07/2019 CLINICAL DATA:  Generalized weakness and lethargy. Sepsis. Ascites. Cough. COVID positive yesterday. EXAM: CT CHEST, ABDOMEN AND PELVIS WITHOUT CONTRAST TECHNIQUE: Multidetector CT imaging of the chest, abdomen and pelvis was performed following the standard protocol without IV contrast. COMPARISON:  Radiograph yesterday. Abdominopelvic CT 09/15/2019 FINDINGS: CT CHEST  FINDINGS Cardiovascular: Right upper extremity PICC with tip in the SVC. Motion artifact partially obscures detailed evaluation. Normal caliber thoracic aorta. Mild atherosclerosis. Mild cardiomegaly. No pericardial effusion. Mediastinum/Nodes: Motion and lack of contrast limits assessment for adenopathy. Small mediastinal lymph nodes not definitively enlarged. Assessment for hilar adenopathy is significantly limited. Esophagus is patulous. No suspicious thyroid nodule. Lungs/Pleura: Moderate right and small left pleural effusion. Adjacent compressive atelectasis. There are multifocal geographic patchy ground-glass opacities throughout both lungs. Trachea and mainstem bronchi are grossly patent, partially motion obscured. Musculoskeletal: Generalized subcutaneous edema throughout the chest wall. Diffuse degenerative change throughout the thoracic spine. There are no acute or suspicious  osseous abnormalities. CT ABDOMEN PELVIS FINDINGS Hepatobiliary: No evidence of focal hepatic abnormality on noncontrast exam. Liver is prominent size spanning 20 cm cranial caudal. Question of nodular hepatic contours. Hyperdense gallbladder contents. No calcified gallstone. Common bile duct is not well-defined. Pancreas: No pancreatic ductal dilatation. No disproportionate peripancreatic edema. Spleen: Normal in size without focal abnormality. Adrenals/Urinary Tract: Adrenal thickening without dominant nodule. Chronic left renal atrophy. Left ureter is nondilated, unchanged 7 mm calcification in the left pelvis equivocally within the ureter. Right kidney appears normal in size. No hydronephrosis or renal calculi. Urinary bladder is distended and unremarkable. Stomach/Bowel: Bowel evaluation is limited in the absence of contrast, presence of ascites, and motion artifact. Nondistended stomach, grossly unremarkable. No small bowel obstruction. Appendix not visualized. Moderate volume of stool throughout the colon. There is mild colonic redundancy. No obvious colonic wall thickening or pericolonic inflammation. Vascular/Lymphatic: Abdominal aorta is normal in caliber. No bulky abdominopelvic adenopathy. Reproductive: Uterus is slightly bulky. Limited assessment for adnexal mass given pelvic ascites. Other: Small to moderate volume abdominopelvic ascites. There is generalized edema of the intra-abdominal fat. Prominent generalized edema of the subcutaneous fat, confluent in the flanks. No free intra-abdominal air. Musculoskeletal: Degenerative change in the lumbar spine. There are no acute or suspicious osseous abnormalities. IMPRESSION: 1. Multifocal geographic patchy ground-glass opacities throughout both lungs, consistent with COVID-19 pneumonia. 2. Fluid overload. Moderate right and small left pleural effusion. Small to moderate volume abdominopelvic ascites. Generalized subcutaneous edema of the chest and abdomen  consistent with third-spacing, confluent edema in the flanks. 3. Question of nodular hepatic contours, recommend correlation for cirrhosis. 4. Chronic left renal atrophy. Unchanged 7 mm calcification in the left pelvis equivocally within the ureter. No hydronephrosis. Aortic Atherosclerosis (ICD10-I70.0). Electronically Signed   By: Narda Rutherford M.D.   On: 11/07/2019 23:00   CT CHEST WO CONTRAST  Result Date: 11/07/2019 CLINICAL DATA:  Generalized weakness and lethargy. Sepsis. Ascites. Cough. COVID positive yesterday. EXAM: CT CHEST, ABDOMEN AND PELVIS WITHOUT CONTRAST TECHNIQUE: Multidetector CT imaging of the chest, abdomen and pelvis was performed following the standard protocol without IV contrast. COMPARISON:  Radiograph yesterday. Abdominopelvic CT 09/15/2019 FINDINGS: CT CHEST FINDINGS Cardiovascular: Right upper extremity PICC with tip in the SVC. Motion artifact partially obscures detailed evaluation. Normal caliber thoracic aorta. Mild atherosclerosis. Mild cardiomegaly. No pericardial effusion. Mediastinum/Nodes: Motion and lack of contrast limits assessment for adenopathy. Small mediastinal lymph nodes not definitively enlarged. Assessment for hilar adenopathy is significantly limited. Esophagus is patulous. No suspicious thyroid nodule. Lungs/Pleura: Moderate right and small left pleural effusion. Adjacent compressive atelectasis. There are multifocal geographic patchy ground-glass opacities throughout both lungs. Trachea and mainstem bronchi are grossly patent, partially motion obscured. Musculoskeletal: Generalized subcutaneous edema throughout the chest wall. Diffuse degenerative change throughout the thoracic spine. There are no acute or suspicious osseous abnormalities. CT ABDOMEN PELVIS FINDINGS  Hepatobiliary: No evidence of focal hepatic abnormality on noncontrast exam. Liver is prominent size spanning 20 cm cranial caudal. Question of nodular hepatic contours. Hyperdense gallbladder  contents. No calcified gallstone. Common bile duct is not well-defined. Pancreas: No pancreatic ductal dilatation. No disproportionate peripancreatic edema. Spleen: Normal in size without focal abnormality. Adrenals/Urinary Tract: Adrenal thickening without dominant nodule. Chronic left renal atrophy. Left ureter is nondilated, unchanged 7 mm calcification in the left pelvis equivocally within the ureter. Right kidney appears normal in size. No hydronephrosis or renal calculi. Urinary bladder is distended and unremarkable. Stomach/Bowel: Bowel evaluation is limited in the absence of contrast, presence of ascites, and motion artifact. Nondistended stomach, grossly unremarkable. No small bowel obstruction. Appendix not visualized. Moderate volume of stool throughout the colon. There is mild colonic redundancy. No obvious colonic wall thickening or pericolonic inflammation. Vascular/Lymphatic: Abdominal aorta is normal in caliber. No bulky abdominopelvic adenopathy. Reproductive: Uterus is slightly bulky. Limited assessment for adnexal mass given pelvic ascites. Other: Small to moderate volume abdominopelvic ascites. There is generalized edema of the intra-abdominal fat. Prominent generalized edema of the subcutaneous fat, confluent in the flanks. No free intra-abdominal air. Musculoskeletal: Degenerative change in the lumbar spine. There are no acute or suspicious osseous abnormalities. IMPRESSION: 1. Multifocal geographic patchy ground-glass opacities throughout both lungs, consistent with COVID-19 pneumonia. 2. Fluid overload. Moderate right and small left pleural effusion. Small to moderate volume abdominopelvic ascites. Generalized subcutaneous edema of the chest and abdomen consistent with third-spacing, confluent edema in the flanks. 3. Question of nodular hepatic contours, recommend correlation for cirrhosis. 4. Chronic left renal atrophy. Unchanged 7 mm calcification in the left pelvis equivocally within the  ureter. No hydronephrosis. Aortic Atherosclerosis (ICD10-I70.0). Electronically Signed   By: Narda Rutherford M.D.   On: 11/07/2019 23:00   DG Chest Port 1 View  Result Date: 11/09/2019 CLINICAL DATA:  Weakness, COVID-19 positive EXAM: PORTABLE CHEST 1 VIEW COMPARISON:  Chest radiograph from one day prior. FINDINGS: Right PICC terminates in the lower third of the SVC. Stable cardiomediastinal silhouette with mild cardiomegaly. No pneumothorax. No pleural effusion. Extensive patchy opacities throughout both lungs, slightly worsened in the upper lungs. IMPRESSION: Extensive patchy opacities throughout both lungs, slightly worsened in the upper lungs, compatible with COVID-19 pneumonia, although with component of pulmonary edema not excluded. Mild cardiomegaly. Electronically Signed   By: Delbert Phenix M.D.   On: 11/09/2019 08:19   DG Chest Port 1 View  Result Date: 11/08/2019 CLINICAL DATA:  Shortness of breath, COVID-19, diabetes mellitus, hypertension, stage III chronic kidney disease, history endometrial cancer EXAM: PORTABLE CHEST 1 VIEW COMPARISON:  Portable exam 0816 hours compared to 11/06/2019 FINDINGS: RIGHT arm PICC line tip projects over SVC. Enlargement of cardiac silhouette with stable mediastinal contours. Extensive BILATERAL pulmonary infiltrates greatest perihilar on RIGHT, question asymmetric pulmonary edema versus multifocal infection. No pleural effusion or pneumothorax. Bones demineralized with scattered degenerative changes of the thoracic spine. IMPRESSION: Enlargement of cardiac silhouette with increased BILATERAL pulmonary infiltrates greatest RIGHT perihilar question asymmetric pulmonary edema versus multifocal pneumonia. Electronically Signed   By: Ulyses Southward M.D.   On: 11/08/2019 08:51   DG CHEST PORT 1 VIEW  Result Date: 11/06/2019 CLINICAL DATA:  Weakness. EXAM: PORTABLE CHEST 1 VIEW COMPARISON:  November 05, 2019. FINDINGS: Stable cardiomegaly with possible central pulmonary  vascular congestion. No pneumothorax is noted. Mild bibasilar atelectasis is noted with small pleural effusions. Bony thorax is unremarkable. IMPRESSION: Stable cardiomegaly with possible central pulmonary vascular congestion. Mild bibasilar subsegmental atelectasis  is noted with small pleural effusions. Electronically Signed   By: Lupita Raider M.D.   On: 11/06/2019 15:57   DG Chest Port 1 View  Result Date: 11/05/2019 CLINICAL DATA:  Weakness. Found on floor today. EXAM: PORTABLE CHEST 1 VIEW COMPARISON:  10/18/2019 FINDINGS: Stable heart size and mediastinal contours with mild cardiomegaly. Hazy opacity at both lung bases likely combination of pleural fluid and airspace disease/atelectasis. Vascular congestion. No pneumothorax. Left proximal humerus fracture appears subacute with surrounding heterotopic ossification, also seen on 09/17/2019 shoulder radiograph. IMPRESSION: 1. Hazy opacity at both lung bases likely combination of pleural fluid and airspace disease/atelectasis. This is stable on the right but progressive on the left from prior. 2. Stable mild cardiomegaly.  Vascular congestion. Electronically Signed   By: Narda Rutherford M.D.   On: 11/05/2019 17:02   DG Chest Portable 1 View  Result Date: 10/18/2019 CLINICAL DATA:  Hypertension and shortness of breath EXAM: PORTABLE CHEST 1 VIEW COMPARISON:  September 17, 2019 FINDINGS: Ill-defined opacity in the right mid and lower lung regions noted with suspected small superimposed pleural effusion. There is mild left base atelectasis. Left lung otherwise clear. Heart is mildly enlarged with pulmonary vascularity normal. No adenopathy. There is degenerative change in the thoracic spine. IMPRESSION: Ill-defined opacity right mid and lower lung zones, likely due to a degree of pneumonia with superimposed small pleural effusion. Mild left base atelectasis. Stable cardiac prominence. No adenopathy evident. Electronically Signed   By: Bretta Bang III M.D.    On: 10/18/2019 14:06   VAS [place] LOWER EXTREMITY VENOUS (DVT)  Result Date: 11/07/2019  Lower Venous DVTStudy Indications: Swelling, and Edema.  Limitations: Body habitus and poor ultrasound/tissue interface. Comparison Study: 09/16/19 previous Performing Technologist: Blanch Media RVS  Examination Guidelines: A complete evaluation includes B-mode imaging, spectral Doppler, color Doppler, and power Doppler as needed of all accessible portions of each vessel. Bilateral testing is considered an integral part of a complete examination. Limited examinations for reoccurring indications may be performed as noted. The reflux portion of the exam is performed with the patient in reverse Trendelenburg.  +---------+---------------+---------+-----------+----------+--------------+ RIGHT    CompressibilityPhasicitySpontaneityPropertiesThrombus Aging +---------+---------------+---------+-----------+----------+--------------+ CFV      Full           Yes      Yes                                 +---------+---------------+---------+-----------+----------+--------------+ SFJ      Full                                                        +---------+---------------+---------+-----------+----------+--------------+ FV Prox  Full                                                        +---------+---------------+---------+-----------+----------+--------------+ FV Mid                                                Not  visualized +---------+---------------+---------+-----------+----------+--------------+ FV Distal               Yes      Yes                                 +---------+---------------+---------+-----------+----------+--------------+ PFV      Full                                                        +---------+---------------+---------+-----------+----------+--------------+ POP      Full           Yes      Yes                                  +---------+---------------+---------+-----------+----------+--------------+ PTV                                                   Not visualized +---------+---------------+---------+-----------+----------+--------------+ PERO                                                  Not visualized +---------+---------------+---------+-----------+----------+--------------+   +---------+---------------+---------+-----------+----------+--------------+ LEFT     CompressibilityPhasicitySpontaneityPropertiesThrombus Aging +---------+---------------+---------+-----------+----------+--------------+ CFV      Full           Yes      Yes                                 +---------+---------------+---------+-----------+----------+--------------+ SFJ      Full                                                        +---------+---------------+---------+-----------+----------+--------------+ FV Prox  Full                                                        +---------+---------------+---------+-----------+----------+--------------+ FV Mid                  Yes      Yes                                 +---------+---------------+---------+-----------+----------+--------------+ FV Distal               Yes      Yes                                 +---------+---------------+---------+-----------+----------+--------------+ PFV      Full                                                        +---------+---------------+---------+-----------+----------+--------------+  POP      Full           Yes      Yes                                 +---------+---------------+---------+-----------+----------+--------------+ PTV      Full                                                        +---------+---------------+---------+-----------+----------+--------------+ PERO                                                  Not visualized  +---------+---------------+---------+-----------+----------+--------------+     Summary: BILATERAL: - No evidence of deep vein thrombosis seen in the lower extremities, bilaterally.   *See table(s) above for measurements and observations. Electronically signed by Gretta Began MD on 11/07/2019 at 4:26:57 PM.    Final    [place] EKG SITE RITE  Result Date: 11/07/2019 If Site Rite image not attached, placement could not be confirmed due to current cardiac rhythm.   Time Spent in minutes  30   Susa Raring M.D on 11/11/2019 at 2:03 PM  To page go to [email or web address] - password [facility]

## 2019-11-11 NOTE — TOC Progression Note (Signed)
Transition of Care [facility]) - Progression Note    Patient Details  Name: Tityana Pagan MRN: [id number] Date of Birth: 1955-08-27  Transition of Care [employer] [facility]) CM/SW [place], LCSW Phone Number: 11/11/2019, 1:53 PM  Clinical Narrative:    Clinicals faxed to insurance for review.    Expected Discharge Plan: Skilled Nursing Facility Barriers to Discharge: Continued Medical Work up, Ship broker  Expected Discharge Plan and Services Expected Discharge Plan: [place] In-house Referral: Clinical Social Work Discharge Planning Services: CM Consult Post Acute Care Choice: [place] Living arrangements for the past 2 months: [place]                                       Social Determinants of Health (SDOH) Interventions    Readmission Risk Interventions Readmission Risk Prevention Plan 11/08/2019 10/21/2019 09/25/2019  Transportation Screening Complete Complete Complete  PCP or Specialist Appt within 3-5 Days - - Not Complete  Not Complete comments - - plan for SNF  HRI or [place] - - Complete  Social Work Consult for [place] Planning/Counseling - - Complete  Palliative Care Screening - - Not Applicable  Medication Review Press photographer) Complete Referral to Pharmacy Referral to Pharmacy  PCP or Specialist appointment within 3-5 days of discharge Complete Complete -  [place] or Home Care Consult Complete (No Data) -  SW Recovery Care/Counseling Consult Complete Complete -  Palliative Care Screening Not Applicable Not Applicable -  Skilled Nursing Facility Complete Complete -  Some recent data might be hidden

## 2019-11-12 LAB — CBC WITH DIFFERENTIAL/PLATELET
Abs Immature Granulocytes: 0.21 10*3/uL — ABNORMAL HIGH (ref 0.00–0.07)
Basophils Absolute: 0 10*3/uL (ref 0.0–0.1)
Basophils Relative: 0 %
Eosinophils Absolute: 0 10*3/uL (ref 0.0–0.5)
Eosinophils Relative: 0 %
HCT: 33.6 % — ABNORMAL LOW (ref 36.0–46.0)
Hemoglobin: 10.2 g/dL — ABNORMAL LOW (ref 12.0–15.0)
Immature Granulocytes: 2 %
Lymphocytes Relative: 3 %
Lymphs Abs: 0.3 10*3/uL — ABNORMAL LOW (ref 0.7–4.0)
MCH: 29.3 pg (ref 26.0–34.0)
MCHC: 30.4 g/dL (ref 30.0–36.0)
MCV: 96.6 fL (ref 80.0–100.0)
Monocytes Absolute: 0.4 10*3/uL (ref 0.1–1.0)
Monocytes Relative: 4 %
Neutro Abs: 10.1 10*3/uL — ABNORMAL HIGH (ref 1.7–7.7)
Neutrophils Relative %: 91 %
Platelets: 147 10*3/uL — ABNORMAL LOW (ref 150–400)
RBC: 3.48 MIL/uL — ABNORMAL LOW (ref 3.87–5.11)
RDW: 18.3 % — ABNORMAL HIGH (ref 11.5–15.5)
WBC: 11.1 10*3/uL — ABNORMAL HIGH (ref 4.0–10.5)
nRBC: 0.3 % — ABNORMAL HIGH (ref 0.0–0.2)

## 2019-11-12 LAB — COMPREHENSIVE METABOLIC PANEL
ALT: 6 U/L (ref 0–44)
AST: 16 U/L (ref 15–41)
Albumin: 2.5 g/dL — ABNORMAL LOW (ref 3.5–5.0)
Alkaline Phosphatase: 89 U/L (ref 38–126)
Anion gap: 13 (ref 5–15)
BUN: 42 mg/dL — ABNORMAL HIGH (ref 8–23)
CO2: 25 mmol/L (ref 22–32)
Calcium: 8.3 mg/dL — ABNORMAL LOW (ref 8.9–10.3)
Chloride: 108 mmol/L (ref 98–111)
Creatinine, Ser: 1.79 mg/dL — ABNORMAL HIGH (ref 0.44–1.00)
GFR calc Af Amer: 34 mL/min — ABNORMAL LOW (ref >60)
GFR calc non Af Amer: 29 mL/min — ABNORMAL LOW (ref >60)
Glucose, Bld: 130 mg/dL — ABNORMAL HIGH (ref 70–99)
Potassium: 3.1 mmol/L — ABNORMAL LOW (ref 3.5–5.1)
Sodium: 146 mmol/L — ABNORMAL HIGH (ref 135–145)
Total Bilirubin: 1 mg/dL (ref 0.3–1.2)
Total Protein: 6.5 g/dL (ref 6.5–8.1)

## 2019-11-12 LAB — GLUCOSE, CAPILLARY
Glucose-Capillary: 161 mg/dL — ABNORMAL HIGH (ref 70–99)
Glucose-Capillary: 163 mg/dL — ABNORMAL HIGH (ref 70–99)
Glucose-Capillary: 155 mg/dL — ABNORMAL HIGH (ref 70–99)
Glucose-Capillary: 115 mg/dL — ABNORMAL HIGH (ref 70–99)

## 2019-11-12 LAB — C-REACTIVE PROTEIN: CRP: 4.4 mg/dL — ABNORMAL HIGH (ref <1.0)

## 2019-11-12 LAB — D-DIMER, QUANTITATIVE: D-Dimer, Quant: 2.95 ug/mL-FEU — ABNORMAL HIGH (ref 0.00–0.50)

## 2019-11-12 LAB — MAGNESIUM: Magnesium: 1.7 mg/dL (ref 1.7–2.4)

## 2019-11-12 MED ORDER — POTASSIUM CHLORIDE CRYS ER 20 MEQ PO TBCR
40.0000 meq | EXTENDED_RELEASE_TABLET | Freq: Once | ORAL | Status: AC
  Administered 2019-11-12: 40 meq via ORAL
  Filled 2019-11-12: qty 2

## 2019-11-12 MED ORDER — DEXTROSE 5 % IV SOLN: INTRAVENOUS | Status: AC

## 2019-11-12 MED ORDER — CAPSAICIN 0.025 % EX CREA
TOPICAL_CREAM | Freq: Two times a day (BID) | CUTANEOUS | Status: DC | PRN
  Administered 2019-11-12: 1 via TOPICAL
  Filled 2019-11-12: qty 60

## 2019-11-12 NOTE — TOC Progression Note (Signed)
Transition of Care [facility]) - Progression Note    Patient Details  Name: Vanessa [name] MRN: [id number] Date of Birth: 25-Sep-1955  Transition of Care [facility]) CM/SW [place], LCSW Phone Number: 11/12/2019, 11:47 AM  Clinical Narrative:    CSW received insurance approval for [place]: #[id number], effective until 5/11.   Expected Discharge Plan: Skilled Nursing Facility Barriers to Discharge: Continued Medical Work up  Expected Discharge Plan and Services Expected Discharge Plan: [place] In-house Referral: Clinical Social Work Discharge Planning Services: CM Consult Post Acute Care Choice: [place] Living arrangements for the past 2 months: [place]                                       Social Determinants of Health (SDOH) Interventions    Readmission Risk Interventions Readmission Risk Prevention Plan 11/08/2019 10/21/2019 09/25/2019  Transportation Screening Complete Complete Complete  PCP or Specialist Appt within 3-5 Days - - Not Complete  Not Complete comments - - plan for SNF  HRI or [place] - - Complete  Social Work Consult for [place] Planning/Counseling - - Complete  Palliative Care Screening - - Not Applicable  Medication Review Press photographer) Complete Referral to Pharmacy Referral to Pharmacy  PCP or Specialist appointment within 3-5 days of discharge Complete Complete -  [place] or Home Care Consult Complete (No Data) -  SW Recovery Care/Counseling Consult Complete Complete -  Palliative Care Screening Not Applicable Not Applicable -  Skilled Nursing Facility Complete Complete -  Some recent data might be hidden

## 2019-11-12 NOTE — Progress Notes (Signed)
PROGRESS NOTE                                                                                                                                                                                                             Patient Demographics:    Vanessa [name], is a 64 y.o. female, DOB - [birth year], PZW:[id number]  Admit date - 11/05/2019   Admitting Physician Mckinley Jewel, MD  Outpatient Primary MD for the patient is Maren Reamer, MD (Inactive)  LOS - 6  CC - fever     Brief Narrative  Vanessa [name] is a 64 y.o. female with medical history significant of hypertension, diabetes, hyperlipidemia, grade 2 diastolic congestive heart failure, CKD stage IIIb, endometrial cancer presents to emergency department due to generalized weakness and lethargy, she was diagnosed with COVID-19 pneumonia with sepsis and hypothermia and admitted to the hospital.    She was recently hospitalized for MSSA bacteremia few weeks ago.  She also carries a history of endometrial cancer under the care of Dr. Berline Lopes GYN oncology deemed a poor surgical candidate for now, also recent GI bleed due to duodenal ulcer.  She has completely recovered from COVID-19 pneumonia her only issue now is persistent hypothermia of unclear etiology, stable TSH and cortisol, appears completely nontoxic, blood cultures negative stable procalcitonin.  Continue providing supportive care.   Subjective:   Patient in bed, appears comfortable, denies any headache, no fever, no chest pain or pressure, no shortness of breath , no abdominal pain. No focal weakness.   Assessment  & Plan :     1.  Sepsis.  Etiology not entirely clear, she is hypothermic without leukocytosis, does have COVID-19 infection and mild pneumonia this admission sepsis clinically ruled out, she recently had MSSA bacteremia few weeks ago.  At this point repeat blood cultures were ordered and stayed negative, stable procalcitonin, currently on doxycycline  but will stop as there is no sign of active bacterial infection, she has completed her remdesivir and steroid course for COVID-19 pneumonia, CT chest abdomen pelvis nonacute.  Continue supportive care.  2.  Endometrial cancer with intermittent bleeding and spotting.  Stable H&H, will type screen, outpatient follow-up with [facility] oncologist Dr. Berline Lopes once discharged.  3.  History of duodenal ulcer with GI bleed.  Oral PPI, SCDs for now and monitor.  Follow with primary gastroenterologist post discharge.  4.  Borderline elevated D-dimer.  Could be due to her endometrial bleed history, negative lower extremity ultrasound. Tough situation  due to recent history of upper GI bleed with ongoing intermittent bleed from her vagina.  Avoiding chemical anticoagulation for now.  5.  Hypertension.  Continue combination of Norvasc, Coreg, increased hydralazine dose for better control.  Tapering steroids hopefully will help with blood pressure.  6.  Intravascular dehydration with hypernatremia.  Gentle D5W and monitor.  7.  Hypothermia.  Completely unclear etiology could be central in origin due to reset thermostat, TSH cortisol stable, appears nontoxic, negative blood cultures, stable procalcitonin, completely symptom-free, supportive care with bear hugger, increase room temperature and monitor, if improves outpatient endocrine follow-up.    8.  Leg edema mostly nonpitting.  Monitor.    9.  MIcrocytic iron deficiency anemia.  On oral iron supplementation likely due to ongoing intermittent vaginal blood loss, outpatient follow-up with PCP and GYN oncology.  10.  Hyponatremia and hypokalemia.  Replace potassium, D5W and monitor.    11. DM type II.  ISS.  Follow closely blood sugars as she is now on D5W and Decadron.  May require Lantus and adjustment of short-acting insulin.  Lab Results  Component Value Date   HGBA1C 5.7 (H) 09/15/2019   CBG (last 3)  Recent Labs    11/11/19 1647 11/11/19 2041  11/12/19 0733  GLUCAP 136* 134* 161*      Condition.  Extremely guarded.  Explained to patient and husband.    Family Communication  :  husband Heyde [phone] on 11/07/19  Code Status :  Full  Disposition Plan  :    Status is: Inpatient  Remains inpatient appropriate because:Hemodynamically unstable   Dispo: The patient is from: SNF              Anticipated d/c is to: SNF              Anticipated d/c date is: > 3 days              Patient currently is not medically stable to d/c.   Consults  :  None  Procedures  :    DVT Prophylaxis  :  SCDs    Lab Results  Component Value Date   PLT 147 (L) 11/12/2019    Diet :  Diet Order            Diet heart healthy/carb modified Room service appropriate? Yes; Fluid consistency: Thin  Diet effective now               Inpatient Medications Scheduled Meds: . albuterol  2 puff Inhalation Q4H  . amLODipine  10 mg Oral Daily  . vitamin C  500 mg Oral Daily  . carvedilol  12.5 mg Oral BID WC  . Chlorhexidine Gluconate Cloth  6 each Topical Daily  . doxazosin  4 mg Oral Daily  . doxycycline  100 mg Oral Q12H  . feeding supplement (PRO-STAT SUGAR FREE 64)  30 mL Oral TID WC  . ferrous sulfate  325 mg Oral TID WC  . hydrALAZINE  100 mg Oral Q8H  . insulin aspart  0-15 Units Subcutaneous TID WC  . insulin aspart  0-5 Units Subcutaneous QHS  . mouth rinse  15 mL Mouth Rinse BID  . pantoprazole sodium  40 mg Oral Daily  . polyethylene glycol  17 g Oral Daily  . zinc sulfate  220 mg Oral Daily   Continuous Infusions: . dextrose 100 mL/hr at 11/12/19 0812   PRN Meds:.acetaminophen, capsaicin, guaiFENesin-dextromethorphan, loratadine, [DISCONTINUED] ondansetron **OR** ondansetron (ZOFRAN) IV, sodium chloride flush  Antibiotics  :   Anti-infectives (From admission, onward)   Start     Dose/Rate Route Frequency Ordered Stop   11/11/19 1000  doxycycline (VIBRA-TABS) tablet 100 mg     100 mg Oral Every 12 hours 11/11/19  0729     11/09/19 1230  vancomycin (VANCOREADY) IVPB 1250 mg/250 mL  Status:  Discontinued     1,250 mg 166.7 mL/hr over 90 Minutes Intravenous Every 48 hours 11/07/19 1100 11/09/19 1318   11/08/19 1000  remdesivir 100 mg in sodium chloride 0.9 % 100 mL IVPB     100 mg 200 mL/hr over 30 Minutes Intravenous Daily 11/07/19 1217 11/11/19 1054   11/07/19 1600  piperacillin-tazobactam (ZOSYN) IVPB 3.375 g  Status:  Discontinued     3.375 g 12.5 mL/hr over 240 Minutes Intravenous Every 8 hours 11/07/19 1033 11/11/19 0729   11/07/19 1245  remdesivir 200 mg in sodium chloride 0.9% 250 mL IVPB     200 mg 580 mL/hr over 30 Minutes Intravenous Once 11/07/19 1216 11/07/19 1728   11/07/19 1130  vancomycin (VANCOREADY) IVPB 2000 mg/400 mL     2,000 mg 200 mL/hr over 120 Minutes Intravenous  Once 11/07/19 1053 11/07/19 1432   11/07/19 1100  piperacillin-tazobactam (ZOSYN) IVPB 3.375 g     3.375 g 100 mL/hr over 30 Minutes Intravenous  Once 11/07/19 1033 11/07/19 1303          Objective:   Vitals:   11/12/19 0000 11/12/19 0100 11/12/19 0337 11/12/19 0800  BP: (!) 168/85  (!) 180/96 (!) 174/80  Pulse: 88   62  Resp: 19   20  Temp:  (!) 97.2 F (36.2 C) (!) 97.4 F (36.3 C) (!) 92 F (33.3 C)  TempSrc:  Oral  Rectal  SpO2: 93%   96%  Weight:      Height:        SpO2: 96 % O2 Flow Rate (L/min): 3 L/min  Wt Readings from Last 3 Encounters:  11/08/19 104.9 kg  10/18/19 100.3 kg  10/17/19 94.9 kg     Intake/Output Summary (Last 24 hours) at 11/12/2019 0918 Last data filed at 11/12/2019 0345 Gross per 24 hour  Intake 1050.98 ml  Output 1100 ml  Net -49.02 ml     Physical Exam  Awake Alert, No new F.N deficits, Normal affect .AT,PERRAL Supple Neck,No JVD, No cervical lymphadenopathy appriciated.  Symmetrical Chest wall movement, Good air movement bilaterally, CTAB RRR,No Gallops, Rubs or new Murmurs, No Parasternal Heave +ve B.Sounds, Abd Soft, No tenderness, No organomegaly  appriciated, No rebound - guarding or rigidity. No Cyanosis, Clubbing or edema, No new Rash or bruise    Data Review:    Recent Labs  Lab 11/08/19 0437 11/09/19 0430 11/10/19 0410 11/11/19 0338 11/12/19 0519  WBC 4.5 4.3 6.4 7.3 11.1*  HGB 8.5* 8.6* 9.1* 9.4* 10.2*  HCT 29.4* 29.0* 30.5* 31.2* 33.6*  PLT 142* 144* 164 145* 147*  MCV 101.4* 97.6 96.2 97.8 96.6  MCH 29.3 29.0 28.7 29.5 29.3  MCHC 28.9* 29.7* 29.8* 30.1 30.4  RDW 18.5* 18.5* 18.4* 18.3* 18.3*  LYMPHSABS 0.2* 0.3* 0.3* 0.3* 0.3*  MONOABS 0.1 0.3 0.3 0.2 0.4  EOSABS 0.0 0.0 0.0 0.0 0.0  BASOSABS 0.0 0.0 0.0 0.0 0.0    Recent Labs  Lab 11/06/19 2140 11/06/19 2140 11/07/19 0441 11/07/19 0441 11/08/19 0437 11/08/19 0500 11/09/19 0430 11/10/19 0410 11/11/19 0338 11/12/19 0519  NA 148*   < > 149*   < > 147*  --  148* 145 148* 146*  K 3.5   < > 3.2*   < > 3.9  --  3.6 3.4* 3.5 3.1*  CL 112*   < > 112*   < > 111  --  111 108 110 108  CO2 26   < > 26   < > 25  --  25 26 25 25   GLUCOSE 109*   < > 110*   < > 188*  --  134* 169* 171* 130*  BUN 28*   < > 28*   < > 31*  --  37* 41* 43* 42*  CREATININE 1.86*   < > 1.76*   < > 1.88*  --  1.93* 1.85* 1.97* 1.79*  CALCIUM 8.7*   < > 8.5*   < > 8.4*  --  8.8* 8.5* 8.2* 8.3*  AST 18   < > 15   < > 14*  --  15 17 17 16   ALT 5   < > 6   < > 7  --  5 6 5 6   ALKPHOS 113   < > 103   < > 105  --  100 91 90 89  BILITOT 0.6   < > 0.8   < > 0.8  --  0.7 0.7 0.7 1.0  ALBUMIN 2.8*   < > 2.6*   < > 2.4*  --  2.4* 2.4* 2.4* 2.5*  MG  --   --  1.8   < > 1.9  --  1.8 1.9 1.9 1.7  CRP 8.7*   < > 8.5*  --  11.2*  --  9.9* 7.1* 5.5*  --   DDIMER 1.54*   < > 1.31*   < > 1.34*  --  2.00* 2.51* 2.47* 2.95*  PROCALCITON 0.46  --  <0.10  --  <0.10  --  <0.10  --   --   --   INR  --   --   --   --  1.2  --   --   --   --   --   TSH 5.158*  --   --   --   --   --   --   --   --   --   BNP 656.0*  --   --   --   --  573.1* 1,043.9*  --   --   --    < > = values in this interval not  displayed.    Recent Labs  Lab 11/06/19 1054 11/06/19 2140 11/06/19 2140 11/07/19 0441 11/07/19 0441 11/08/19 0437 11/08/19 0500 11/09/19 0430 11/10/19 0410 11/11/19 0338 11/12/19 0519  CRP  --  8.7*   < > 8.5*  --  11.2*  --  9.9* 7.1* 5.5*  --   DDIMER  --  1.54*   < > 1.31*   < > 1.34*  --  2.00* 2.51* 2.47* 2.95*  BNP  --  656.0*  --   --   --   --  573.1* 1,043.9*  --   --   --   PROCALCITON  --  0.46  --  <0.10  --  <0.10  --  <0.10  --   --   --   SARSCOV2NAA POSITIVE*  --   --   --   --   --   --   --   --   --   --    < > = values in  this interval not displayed.    ------------------------------------------------------------------------------------------------------------------ No results for input(s): CHOL, HDL, LDLCALC, TRIG, CHOLHDL, LDLDIRECT in the last 72 hours.  Lab Results  Component Value Date   HGBA1C 5.7 (H) 09/15/2019   ------------------------------------------------------------------------------------------------------------------ No results for input(s): TSH, T4TOTAL, T3FREE, THYROIDAB in the last 72 hours.  Invalid input(s): FREET3 ------------------------------------------------------------------------------------------------------------------ No results for input(s): VITAMINB12, FOLATE, FERRITIN, TIBC, IRON, RETICCTPCT in the last 72 hours.  Coagulation profile Recent Labs  Lab 11/08/19 0437  INR 1.2    Recent Labs    11/11/19 0338 11/12/19 0519  DDIMER 2.47* 2.95*    Cardiac Enzymes No results for input(s): CKMB, TROPONINI, MYOGLOBIN in the last 168 hours.  Invalid input(s): CK ------------------------------------------------------------------------------------------------------------------    Component Value Date/Time   BNP 1,043.9 (H) 11/09/2019 0430    Micro Results Recent Results (from the past 240 hour(s))  Respiratory Panel by RT PCR (Flu A&B, Covid) - Nasopharyngeal Swab     Status: Abnormal   Collection Time: 11/06/19  10:54 AM   Specimen: Nasopharyngeal Swab  Result Value Ref Range Status   SARS Coronavirus 2 by RT PCR POSITIVE (A) NEGATIVE Final    Comment: RESULT CALLED TO, READ BACK BY AND VERIFIED WITH: Marzella Schlein RN 12:35 11/06/19 ([email or web address]) (NOTE) SARS-CoV-2 target nucleic acids are DETECTED. SARS-CoV-2 RNA is generally detectable in upper respiratory specimens  during the acute phase of infection. Positive results are indicative of the presence of the identified virus, but do not rule out bacterial infection or co-infection with other pathogens not detected by the test. Clinical correlation with patient history and other diagnostic information is necessary to determine patient infection status. The expected result is Negative. Fact Sheet for Patients:  PinkCheek.be Fact Sheet for Healthcare Providers: GravelBags.it This test is not yet approved or cleared by the [place] FDA and  has been authorized for detection and/or diagnosis of SARS-CoV-2 by FDA under an Emergency Use Authorization (EUA).  This EUA will remain in effect (meaning this test can be used)  for the duration of  the COVID-19 declaration under Section 564(b)(1) of the Act, 21 U.S.C. section 360bbb-3(b)(1), unless the authorization is terminated or revoked sooner.    Influenza A by PCR NEGATIVE NEGATIVE Final   Influenza B by PCR NEGATIVE NEGATIVE Final    Comment: (NOTE) The Xpert Xpress SARS-CoV-2/FLU/RSV assay is intended as an aid in  the diagnosis of influenza from Nasopharyngeal swab specimens and  should not be used as a sole basis for treatment. Nasal washings and  aspirates are unacceptable for Xpert Xpress SARS-CoV-2/FLU/RSV  testing. Fact Sheet for Patients: PinkCheek.be Fact Sheet for Healthcare Providers: GravelBags.it This test is not yet approved or cleared by the [place] FDA and  has  been authorized for detection and/or diagnosis of SARS-CoV-2 by  FDA under an Emergency Use Authorization (EUA). This EUA will remain  in effect (meaning this test can be used) for the duration of the  Covid-19 declaration under Section 564(b)(1) of the Act, 21  U.S.C. section 360bbb-3(b)(1), unless the authorization is  terminated or revoked. Performed at [facility], [place] [address] [place], [place] [address]   Culture, blood (Routine X 2) w Reflex to ID Panel     Status: None   Collection Time: 11/06/19  9:40 PM   Specimen: BLOOD  Result Value Ref Range Status   Specimen Description BLOOD RIGHT ANTECUBITAL  Final   Special Requests   Final    BOTTLES DRAWN AEROBIC AND ANAEROBIC Blood Culture  adequate volume   Culture   Final    NO GROWTH 5 DAYS Performed at [facility], [place] [address], [place], [place] [address]    Report Status 11/11/2019 FINAL  Final  Culture, blood (Routine X 2) w Reflex to ID Panel     Status: None   Collection Time: 11/06/19  9:49 PM   Specimen: BLOOD  Result Value Ref Range Status   Specimen Description BLOOD LEFT ANTECUBITAL  Final   Special Requests   Final    BOTTLES DRAWN AEROBIC ONLY Blood Culture adequate volume   Culture   Final    NO GROWTH 5 DAYS Performed at [facility], [place] [address], [place], [place] [address]    Report Status 11/11/2019 FINAL  Final    Radiology Reports CT ABDOMEN PELVIS WO CONTRAST  Result Date: 11/07/2019 CLINICAL DATA:  Generalized weakness and lethargy. Sepsis. Ascites. Cough. COVID positive yesterday. EXAM: CT CHEST, ABDOMEN AND PELVIS WITHOUT CONTRAST TECHNIQUE: Multidetector CT imaging of the chest, abdomen and pelvis was performed following the standard protocol without IV contrast. COMPARISON:  Radiograph yesterday. Abdominopelvic CT 09/15/2019 FINDINGS: CT CHEST FINDINGS Cardiovascular: Right upper extremity PICC with tip in the SVC. Motion artifact partially obscures detailed evaluation.  Normal caliber thoracic aorta. Mild atherosclerosis. Mild cardiomegaly. No pericardial effusion. Mediastinum/Nodes: Motion and lack of contrast limits assessment for adenopathy. Small mediastinal lymph nodes not definitively enlarged. Assessment for hilar adenopathy is significantly limited. Esophagus is patulous. No suspicious thyroid nodule. Lungs/Pleura: Moderate right and small left pleural effusion. Adjacent compressive atelectasis. There are multifocal geographic patchy ground-glass opacities throughout both lungs. Trachea and mainstem bronchi are grossly patent, partially motion obscured. Musculoskeletal: Generalized subcutaneous edema throughout the chest wall. Diffuse degenerative change throughout the thoracic spine. There are no acute or suspicious osseous abnormalities. CT ABDOMEN PELVIS FINDINGS Hepatobiliary: No evidence of focal hepatic abnormality on noncontrast exam. Liver is prominent size spanning 20 cm cranial caudal. Question of nodular hepatic contours. Hyperdense gallbladder contents. No calcified gallstone. Common bile duct is not well-defined. Pancreas: No pancreatic ductal dilatation. No disproportionate peripancreatic edema. Spleen: Normal in size without focal abnormality. Adrenals/Urinary Tract: Adrenal thickening without dominant nodule. Chronic left renal atrophy. Left ureter is nondilated, unchanged 7 mm calcification in the left pelvis equivocally within the ureter. Right kidney appears normal in size. No hydronephrosis or renal calculi. Urinary bladder is distended and unremarkable. Stomach/Bowel: Bowel evaluation is limited in the absence of contrast, presence of ascites, and motion artifact. Nondistended stomach, grossly unremarkable. No small bowel obstruction. Appendix not visualized. Moderate volume of stool throughout the colon. There is mild colonic redundancy. No obvious colonic wall thickening or pericolonic inflammation. Vascular/Lymphatic: Abdominal aorta is normal in  caliber. No bulky abdominopelvic adenopathy. Reproductive: Uterus is slightly bulky. Limited assessment for adnexal mass given pelvic ascites. Other: Small to moderate volume abdominopelvic ascites. There is generalized edema of the intra-abdominal fat. Prominent generalized edema of the subcutaneous fat, confluent in the flanks. No free intra-abdominal air. Musculoskeletal: Degenerative change in the lumbar spine. There are no acute or suspicious osseous abnormalities. IMPRESSION: 1. Multifocal geographic patchy ground-glass opacities throughout both lungs, consistent with COVID-19 pneumonia. 2. Fluid overload. Moderate right and small left pleural effusion. Small to moderate volume abdominopelvic ascites. Generalized subcutaneous edema of the chest and abdomen consistent with third-spacing, confluent edema in the flanks. 3. Question of nodular hepatic contours, recommend correlation for cirrhosis. 4. Chronic left renal atrophy. Unchanged 7 mm calcification in the left pelvis equivocally within  the ureter. No hydronephrosis. Aortic Atherosclerosis (ICD10-I70.0). Electronically Signed   By: Keith Rake M.D.   On: 11/07/2019 23:00   CT CHEST WO CONTRAST  Result Date: 11/07/2019 CLINICAL DATA:  Generalized weakness and lethargy. Sepsis. Ascites. Cough. COVID positive yesterday. EXAM: CT CHEST, ABDOMEN AND PELVIS WITHOUT CONTRAST TECHNIQUE: Multidetector CT imaging of the chest, abdomen and pelvis was performed following the standard protocol without IV contrast. COMPARISON:  Radiograph yesterday. Abdominopelvic CT 09/15/2019 FINDINGS: CT CHEST FINDINGS Cardiovascular: Right upper extremity PICC with tip in the SVC. Motion artifact partially obscures detailed evaluation. Normal caliber thoracic aorta. Mild atherosclerosis. Mild cardiomegaly. No pericardial effusion. Mediastinum/Nodes: Motion and lack of contrast limits assessment for adenopathy. Small mediastinal lymph nodes not definitively enlarged.  Assessment for hilar adenopathy is significantly limited. Esophagus is patulous. No suspicious thyroid nodule. Lungs/Pleura: Moderate right and small left pleural effusion. Adjacent compressive atelectasis. There are multifocal geographic patchy ground-glass opacities throughout both lungs. Trachea and mainstem bronchi are grossly patent, partially motion obscured. Musculoskeletal: Generalized subcutaneous edema throughout the chest wall. Diffuse degenerative change throughout the thoracic spine. There are no acute or suspicious osseous abnormalities. CT ABDOMEN PELVIS FINDINGS Hepatobiliary: No evidence of focal hepatic abnormality on noncontrast exam. Liver is prominent size spanning 20 cm cranial caudal. Question of nodular hepatic contours. Hyperdense gallbladder contents. No calcified gallstone. Common bile duct is not well-defined. Pancreas: No pancreatic ductal dilatation. No disproportionate peripancreatic edema. Spleen: Normal in size without focal abnormality. Adrenals/Urinary Tract: Adrenal thickening without dominant nodule. Chronic left renal atrophy. Left ureter is nondilated, unchanged 7 mm calcification in the left pelvis equivocally within the ureter. Right kidney appears normal in size. No hydronephrosis or renal calculi. Urinary bladder is distended and unremarkable. Stomach/Bowel: Bowel evaluation is limited in the absence of contrast, presence of ascites, and motion artifact. Nondistended stomach, grossly unremarkable. No small bowel obstruction. Appendix not visualized. Moderate volume of stool throughout the colon. There is mild colonic redundancy. No obvious colonic wall thickening or pericolonic inflammation. Vascular/Lymphatic: Abdominal aorta is normal in caliber. No bulky abdominopelvic adenopathy. Reproductive: Uterus is slightly bulky. Limited assessment for adnexal mass given pelvic ascites. Other: Small to moderate volume abdominopelvic ascites. There is generalized edema of the  intra-abdominal fat. Prominent generalized edema of the subcutaneous fat, confluent in the flanks. No free intra-abdominal air. Musculoskeletal: Degenerative change in the lumbar spine. There are no acute or suspicious osseous abnormalities. IMPRESSION: 1. Multifocal geographic patchy ground-glass opacities throughout both lungs, consistent with COVID-19 pneumonia. 2. Fluid overload. Moderate right and small left pleural effusion. Small to moderate volume abdominopelvic ascites. Generalized subcutaneous edema of the chest and abdomen consistent with third-spacing, confluent edema in the flanks. 3. Question of nodular hepatic contours, recommend correlation for cirrhosis. 4. Chronic left renal atrophy. Unchanged 7 mm calcification in the left pelvis equivocally within the ureter. No hydronephrosis. Aortic Atherosclerosis (ICD10-I70.0). Electronically Signed   By: Keith Rake M.D.   On: 11/07/2019 23:00   DG Chest Port 1 View  Result Date: 11/09/2019 CLINICAL DATA:  Weakness, COVID-19 positive EXAM: PORTABLE CHEST 1 VIEW COMPARISON:  Chest radiograph from one day prior. FINDINGS: Right PICC terminates in the lower third of the SVC. Stable cardiomediastinal silhouette with mild cardiomegaly. No pneumothorax. No pleural effusion. Extensive patchy opacities throughout both lungs, slightly worsened in the upper lungs. IMPRESSION: Extensive patchy opacities throughout both lungs, slightly worsened in the upper lungs, compatible with COVID-19 pneumonia, although with component of pulmonary edema not excluded. Mild cardiomegaly. Electronically Signed   By:  Ilona Sorrel M.D.   On: 11/09/2019 08:19   DG Chest Port 1 View  Result Date: 11/08/2019 CLINICAL DATA:  Shortness of breath, COVID-19, diabetes mellitus, hypertension, stage III chronic kidney disease, history endometrial cancer EXAM: PORTABLE CHEST 1 VIEW COMPARISON:  Portable exam 0816 hours compared to 11/06/2019 FINDINGS: RIGHT arm PICC line tip projects  over SVC. Enlargement of cardiac silhouette with stable mediastinal contours. Extensive BILATERAL pulmonary infiltrates greatest perihilar on RIGHT, question asymmetric pulmonary edema versus multifocal infection. No pleural effusion or pneumothorax. Bones demineralized with scattered degenerative changes of the thoracic spine. IMPRESSION: Enlargement of cardiac silhouette with increased BILATERAL pulmonary infiltrates greatest RIGHT perihilar question asymmetric pulmonary edema versus multifocal pneumonia. Electronically Signed   By: Lavonia Dana M.D.   On: 11/08/2019 08:51   DG CHEST PORT 1 VIEW  Result Date: 11/06/2019 CLINICAL DATA:  Weakness. EXAM: PORTABLE CHEST 1 VIEW COMPARISON:  November 05, 2019. FINDINGS: Stable cardiomegaly with possible central pulmonary vascular congestion. No pneumothorax is noted. Mild bibasilar atelectasis is noted with small pleural effusions. Bony thorax is unremarkable. IMPRESSION: Stable cardiomegaly with possible central pulmonary vascular congestion. Mild bibasilar subsegmental atelectasis is noted with small pleural effusions. Electronically Signed   By: Marijo Conception M.D.   On: 11/06/2019 15:57   DG Chest Port 1 View  Result Date: 11/05/2019 CLINICAL DATA:  Weakness. Found on floor today. EXAM: PORTABLE CHEST 1 VIEW COMPARISON:  10/18/2019 FINDINGS: Stable heart size and mediastinal contours with mild cardiomegaly. Hazy opacity at both lung bases likely combination of pleural fluid and airspace disease/atelectasis. Vascular congestion. No pneumothorax. Left proximal humerus fracture appears subacute with surrounding heterotopic ossification, also seen on 09/17/2019 shoulder radiograph. IMPRESSION: 1. Hazy opacity at both lung bases likely combination of pleural fluid and airspace disease/atelectasis. This is stable on the right but progressive on the left from prior. 2. Stable mild cardiomegaly.  Vascular congestion. Electronically Signed   By: Keith Rake M.D.    On: 11/05/2019 17:02   DG Chest Portable 1 View  Result Date: 10/18/2019 CLINICAL DATA:  Hypertension and shortness of breath EXAM: PORTABLE CHEST 1 VIEW COMPARISON:  September 17, 2019 FINDINGS: Ill-defined opacity in the right mid and lower lung regions noted with suspected small superimposed pleural effusion. There is mild left base atelectasis. Left lung otherwise clear. Heart is mildly enlarged with pulmonary vascularity normal. No adenopathy. There is degenerative change in the thoracic spine. IMPRESSION: Ill-defined opacity right mid and lower lung zones, likely due to a degree of pneumonia with superimposed small pleural effusion. Mild left base atelectasis. Stable cardiac prominence. No adenopathy evident. Electronically Signed   By: Lowella Grip III M.D.   On: 10/18/2019 14:06   VAS [place] LOWER EXTREMITY VENOUS (DVT)  Result Date: 11/07/2019  Lower Venous DVTStudy Indications: Swelling, and Edema.  Limitations: Body habitus and poor ultrasound/tissue interface. Comparison Study: 09/16/19 previous Performing Technologist: Abram Sander RVS  Examination Guidelines: A complete evaluation includes B-mode imaging, spectral Doppler, color Doppler, and power Doppler as needed of all accessible portions of each vessel. Bilateral testing is considered an integral part of a complete examination. Limited examinations for reoccurring indications may be performed as noted. The reflux portion of the exam is performed with the patient in reverse Trendelenburg.  +---------+---------------+---------+-----------+----------+--------------+ RIGHT    CompressibilityPhasicitySpontaneityPropertiesThrombus Aging +---------+---------------+---------+-----------+----------+--------------+ CFV      Full           Yes      Yes                                 +---------+---------------+---------+-----------+----------+--------------+  SFJ      Full                                                         +---------+---------------+---------+-----------+----------+--------------+ FV Prox  Full                                                        +---------+---------------+---------+-----------+----------+--------------+ FV Mid                                                Not visualized +---------+---------------+---------+-----------+----------+--------------+ FV Distal               Yes      Yes                                 +---------+---------------+---------+-----------+----------+--------------+ PFV      Full                                                        +---------+---------------+---------+-----------+----------+--------------+ POP      Full           Yes      Yes                                 +---------+---------------+---------+-----------+----------+--------------+ PTV                                                   Not visualized +---------+---------------+---------+-----------+----------+--------------+ PERO                                                  Not visualized +---------+---------------+---------+-----------+----------+--------------+   +---------+---------------+---------+-----------+----------+--------------+ LEFT     CompressibilityPhasicitySpontaneityPropertiesThrombus Aging +---------+---------------+---------+-----------+----------+--------------+ CFV      Full           Yes      Yes                                 +---------+---------------+---------+-----------+----------+--------------+ SFJ      Full                                                        +---------+---------------+---------+-----------+----------+--------------+ FV Prox  Full                                                        +---------+---------------+---------+-----------+----------+--------------+  FV Mid                  Yes      Yes                                  +---------+---------------+---------+-----------+----------+--------------+ FV Distal               Yes      Yes                                 +---------+---------------+---------+-----------+----------+--------------+ PFV      Full                                                        +---------+---------------+---------+-----------+----------+--------------+ POP      Full           Yes      Yes                                 +---------+---------------+---------+-----------+----------+--------------+ PTV      Full                                                        +---------+---------------+---------+-----------+----------+--------------+ PERO                                                  Not visualized +---------+---------------+---------+-----------+----------+--------------+     Summary: BILATERAL: - No evidence of deep vein thrombosis seen in the lower extremities, bilaterally.   *See table(s) above for measurements and observations. Electronically signed by Curt Jews MD on 11/07/2019 at 4:26:57 PM.    Final    [place] EKG SITE RITE  Result Date: 11/07/2019 If Site Rite image not attached, placement could not be confirmed due to current cardiac rhythm.   Time Spent in minutes  30   Lala Lund M.D on 11/12/2019 at 9:18 AM  To page go to [email or web address] - password [facility]

## 2019-11-12 NOTE — Plan of Care (Signed)
  Problem: Education: Goal: Knowledge of General Education information will improve Description: Including pain rating scale, medication(s)/side effects and non-pharmacologic comfort measures Outcome: Progressing   Problem: Health Behavior/Discharge Planning: Goal: Ability to manage health-related needs will improve Outcome: Progressing   Problem: Clinical Measurements: Goal: Ability to maintain clinical measurements within normal limits will improve Outcome: Progressing Goal: Will remain free from infection Outcome: Progressing Goal: Diagnostic test results will improve Outcome: Progressing Goal: Respiratory complications will improve Outcome: Progressing Goal: Cardiovascular complication will be avoided Outcome: Progressing   Problem: Activity: Goal: Risk for activity intolerance will decrease Outcome: Progressing   Problem: Nutrition: Goal: Adequate nutrition will be maintained Outcome: Progressing   Problem: Coping: Goal: Level of anxiety will decrease Outcome: Progressing   Problem: Elimination: Goal: Will not experience complications related to bowel motility Outcome: Progressing Goal: Will not experience complications related to urinary retention Outcome: Progressing   Problem: Pain Managment: Goal: General experience of comfort will improve Outcome: Progressing   Problem: Safety: Goal: Ability to remain free from injury will improve Outcome: Progressing   Problem: Skin Integrity: Goal: Risk for impaired skin integrity will decrease Outcome: Progressing   Problem: Education: Goal: Knowledge of risk factors and measures for prevention of condition will improve Outcome: Progressing   Problem: Coping: Goal: Psychosocial and spiritual needs will be supported Outcome: Progressing   Problem: Respiratory: Goal: Will maintain a patent airway Outcome: Progressing Goal: Complications related to the disease process, condition or treatment will be avoided or  minimized Outcome: Progressing   Problem: Education: Goal: Ability to demonstrate management of disease process will improve Outcome: Progressing Goal: Ability to verbalize understanding of medication therapies will improve Outcome: Progressing Goal: Individualized Educational Video(s) Outcome: Progressing   Problem: Activity: Goal: Capacity to carry out activities will improve Outcome: Progressing   Problem: Cardiac: Goal: Ability to achieve and maintain adequate cardiopulmonary perfusion will improve Outcome: Progressing   Problem: Education: Goal: Knowledge of disease and its progression will improve Outcome: Progressing Goal: Individualized Educational Video(s) Outcome: Progressing   Problem: Fluid Volume: Goal: Compliance with measures to maintain balanced fluid volume will improve Outcome: Progressing   Problem: Health Behavior/Discharge Planning: Goal: Ability to manage health-related needs will improve Outcome: Progressing   Problem: Nutritional: Goal: Ability to make healthy dietary choices will improve Outcome: Progressing   Problem: Clinical Measurements: Goal: Complications related to the disease process, condition or treatment will be avoided or minimized Outcome: Progressing   

## 2019-11-13 DIAGNOSIS — E1121 Type 2 diabetes mellitus with diabetic nephropathy: Secondary | ICD-10-CM

## 2019-11-13 DIAGNOSIS — N1832 Chronic kidney disease, stage 3b: Secondary | ICD-10-CM

## 2019-11-13 DIAGNOSIS — U071 COVID-19: Secondary | ICD-10-CM

## 2019-11-13 DIAGNOSIS — E876 Hypokalemia: Secondary | ICD-10-CM

## 2019-11-13 LAB — LACTIC ACID, PLASMA
Lactic Acid, Venous: 1.5 mmol/L (ref 0.5–1.9)
Lactic Acid, Venous: 1.2 mmol/L (ref 0.5–1.9)

## 2019-11-13 LAB — COMPREHENSIVE METABOLIC PANEL
ALT: 6 U/L (ref 0–44)
AST: 13 U/L — ABNORMAL LOW (ref 15–41)
Albumin: 2.2 g/dL — ABNORMAL LOW (ref 3.5–5.0)
Alkaline Phosphatase: 98 U/L (ref 38–126)
Anion gap: 11 (ref 5–15)
BUN: 44 mg/dL — ABNORMAL HIGH (ref 8–23)
CO2: 25 mmol/L (ref 22–32)
Calcium: 8.3 mg/dL — ABNORMAL LOW (ref 8.9–10.3)
Chloride: 110 mmol/L (ref 98–111)
Creatinine, Ser: 1.77 mg/dL — ABNORMAL HIGH (ref 0.44–1.00)
GFR calc Af Amer: 34 mL/min — ABNORMAL LOW (ref >60)
GFR calc non Af Amer: 30 mL/min — ABNORMAL LOW (ref >60)
Glucose, Bld: 126 mg/dL — ABNORMAL HIGH (ref 70–99)
Potassium: 3 mmol/L — ABNORMAL LOW (ref 3.5–5.1)
Sodium: 146 mmol/L — ABNORMAL HIGH (ref 135–145)
Total Bilirubin: 0.8 mg/dL (ref 0.3–1.2)
Total Protein: 6.1 g/dL — ABNORMAL LOW (ref 6.5–8.1)

## 2019-11-13 LAB — CBC WITH DIFFERENTIAL/PLATELET
Abs Immature Granulocytes: 0.11 10*3/uL — ABNORMAL HIGH (ref 0.00–0.07)
Basophils Absolute: 0 10*3/uL (ref 0.0–0.1)
Basophils Relative: 0 %
Eosinophils Absolute: 0 10*3/uL (ref 0.0–0.5)
Eosinophils Relative: 0 %
HCT: 31.4 % — ABNORMAL LOW (ref 36.0–46.0)
Hemoglobin: 9.5 g/dL — ABNORMAL LOW (ref 12.0–15.0)
Immature Granulocytes: 1 %
Lymphocytes Relative: 5 %
Lymphs Abs: 0.4 10*3/uL — ABNORMAL LOW (ref 0.7–4.0)
MCH: 29.1 pg (ref 26.0–34.0)
MCHC: 30.3 g/dL (ref 30.0–36.0)
MCV: 96 fL (ref 80.0–100.0)
Monocytes Absolute: 0.4 10*3/uL (ref 0.1–1.0)
Monocytes Relative: 5 %
Neutro Abs: 7.1 10*3/uL (ref 1.7–7.7)
Neutrophils Relative %: 89 %
Platelets: 129 10*3/uL — ABNORMAL LOW (ref 150–400)
RBC: 3.27 MIL/uL — ABNORMAL LOW (ref 3.87–5.11)
RDW: 18.1 % — ABNORMAL HIGH (ref 11.5–15.5)
WBC: 8 10*3/uL (ref 4.0–10.5)
nRBC: 0.4 % — ABNORMAL HIGH (ref 0.0–0.2)

## 2019-11-13 LAB — CORTISOL: Cortisol, Plasma: 17.8 ug/dL

## 2019-11-13 LAB — D-DIMER, QUANTITATIVE: D-Dimer, Quant: 2.39 ug/mL-FEU — ABNORMAL HIGH (ref 0.00–0.50)

## 2019-11-13 LAB — GLUCOSE, CAPILLARY
Glucose-Capillary: 129 mg/dL — ABNORMAL HIGH (ref 70–99)
Glucose-Capillary: 144 mg/dL — ABNORMAL HIGH (ref 70–99)
Glucose-Capillary: 145 mg/dL — ABNORMAL HIGH (ref 70–99)
Glucose-Capillary: 127 mg/dL — ABNORMAL HIGH (ref 70–99)

## 2019-11-13 LAB — MAGNESIUM: Magnesium: 1.8 mg/dL (ref 1.7–2.4)

## 2019-11-13 LAB — TSH: TSH: 4.135 u[IU]/mL (ref 0.350–4.500)

## 2019-11-13 LAB — PROCALCITONIN: Procalcitonin: 0.25 ng/mL

## 2019-11-13 LAB — C-REACTIVE PROTEIN: CRP: 9.1 mg/dL — ABNORMAL HIGH (ref <1.0)

## 2019-11-13 LAB — T4, FREE: Free T4: 1.19 ng/dL — ABNORMAL HIGH (ref 0.61–1.12)

## 2019-11-13 MED ORDER — POTASSIUM CHLORIDE CRYS ER 20 MEQ PO TBCR
40.0000 meq | EXTENDED_RELEASE_TABLET | ORAL | Status: AC
  Administered 2019-11-13 (×3): 40 meq via ORAL
  Filled 2019-11-13 (×3): qty 2

## 2019-11-13 MED ORDER — PANTOPRAZOLE SODIUM 40 MG PO PACK
40.0000 mg | PACK | Freq: Two times a day (BID) | ORAL | Status: DC
  Administered 2019-11-13 – 2019-11-22 (×19): 40 mg via ORAL
  Filled 2019-11-13 (×20): qty 20

## 2019-11-13 MED ORDER — HEPARIN SODIUM (PORCINE) 5000 UNIT/ML IJ SOLN
5000.0000 [IU] | Freq: Three times a day (TID) | INTRAMUSCULAR | Status: DC
  Administered 2019-11-13 – 2019-11-22 (×27): 5000 [IU] via SUBCUTANEOUS
  Filled 2019-11-13 (×27): qty 1

## 2019-11-13 MED ORDER — MAGNESIUM SULFATE IN D5W 1-5 GM/100ML-% IV SOLN
1.0000 g | Freq: Once | INTRAVENOUS | Status: AC
  Administered 2019-11-13: 1 g via INTRAVENOUS
  Filled 2019-11-13 (×2): qty 100

## 2019-11-13 MED ORDER — FUROSEMIDE 10 MG/ML IJ SOLN
40.0000 mg | Freq: Once | INTRAMUSCULAR | Status: AC
  Administered 2019-11-13: 40 mg via INTRAVENOUS
  Filled 2019-11-13: qty 4

## 2019-11-13 NOTE — Progress Notes (Addendum)
OT Cancellation Note  Patient Details Name: Vanessa [name] MRN: [id number] DOB: [birth year]   Cancelled Treatment:    Reason Eval/Treat Not Completed: Medical issues which prohibited therapy; pt with notably low temp, currently with bearhugger on. Will follow up for OT treatment as able.  Lou Cal, OT Acute Rehabilitation Services Pager [phone] Office [phone]   Raymondo Band 11/13/2019, 1:29 PM

## 2019-11-13 NOTE — Progress Notes (Signed)
   11/13/19 2229  Assess: MEWS Score  Temp (!) 92.8 F (33.8 C)  BP (!) 175/82  Pulse Rate (!) 55  ECG Heart Rate (!) 55  Resp (!) 22  Level of Consciousness Alert  SpO2 94 %  O2 Device Nasal Cannula  Patient Activity (if Appropriate) In bed  O2 Flow Rate (L/min) 2 L/min  Assess: MEWS Score  MEWS Temp 2  MEWS Systolic 0  MEWS Pulse 0  MEWS RR 1  MEWS LOC 0  MEWS Score 3  MEWS Score Color Yellow  Assess: if the MEWS score is Yellow or Red  Were vital signs taken at a resting state? Yes  Focused Assessment Documented focused assessment  Early Detection of Sepsis Score *See Row Information* High  MEWS guidelines implemented *See Row Information* Yes  Treat  MEWS Interventions Administered scheduled meds/treatments (bairhugger)  Take Vital Signs  Increase Vital Sign Frequency  Yellow: Q 2hr X 2 then Q 4hr X 2, if remains yellow, continue Q 4hrs  Escalate  MEWS: Escalate Yellow: discuss with charge nurse/RN and consider discussing with provider and RRT  Notify: Charge Nurse/RN  Name of Charge Nurse/RN Notified n/a (this nurse is charge nurse for the unit)  Date Charge Nurse/RN Notified 11/13/19  Time Charge Nurse/RN Notified 2221  Notify: Provider  Provider Name/Title T. Opyd, MD  Date Provider Notified 11/13/19  Time Provider Notified 2304  Notification Type Page  Notification Reason Change in status   On assessment, pt was resistant to having temperature checked rectally.  Explained rationale of checking temperature rectally, pt continued to refuse.  Agreed to attempt to check temperature orally.  2 attempts at an oral temp and 1 attempt at an axillary temp failed to produce a reading.  Again explained the importance of getting an accurate core temperature to the pt, pt finally agreed to rectal temp.  See assessment above.  Pt noted to have bairhugger in the room, but had it pushed to the end of the bed.  Re-enforced teaching to pt about need to comply with bairhugger  treatment to raise temperature.  Pt agreed to have bairhugger replaced.  Paged [facility] provider T. Opyd, MD, page acknowledged, no new orders received.  Will continue to monitor per yellow MEWS protocol.

## 2019-11-13 NOTE — Progress Notes (Signed)
PROGRESS NOTE                                                                                                                                                                                                             Patient Demographics:    Vanessa [name], is a 64 y.o. female, DOB - 02-03-1956, OZH:[id number]  Admit date - 11/05/2019   Admitting Physician Mckinley Jewel, MD  Outpatient Primary MD for the patient is Maren Reamer, MD (Inactive)  LOS - 7  CC - fever     Brief Narrative    -Vanessa [name] is a 64 y.o. female with medical history significant of hypertension, diabetes, hyperlipidemia, grade 2 diastolic congestive heart failure, CKD stage IIIb, endometrial cancer presents to emergency department due to generalized weakness and lethargy, she was diagnosed with COVID-19 pneumonia with sepsis and hypothermia and admitted to the hospital.    She was recently hospitalized for MSSA bacteremia few weeks ago.  She also carries a history of endometrial cancer under the care of Dr. Berline Lopes GYN oncology deemed a poor surgical candidate for now, also recent GI bleed due to duodenal ulcer.  She has completely recovered from COVID-19 pneumonia her only issue now is persistent hypothermia of unclear etiology, stable TSH and cortisol, appears completely nontoxic, blood cultures negative stable procalcitonin.  Continue providing supportive care.   Subjective:   Patient in bed, appears comfortable, denies any headache, no fever, no chest pain or pressure, no shortness of breath , no abdominal pain. No focal weakness.   Assessment  & Plan :     SIRS/COVID 19 infection -  Etiology not entirely clear, she is hypothermic without leukocytosis, does have COVID-19 infection and mild pneumonia this admission sepsis clinically ruled out, she recently had MSSA bacteremia few weeks ago. -Reports she received the first dose of Covid vaccine last March, second 1 remains due - At this  point repeat blood cultures were ordered and stayed negative, stable procalcitonin, cefazolin has been stopped, she has completed her remdesivir and steroid course for COVID-19 pneumonia, CT chest abdomen pelvis nonacute.  Continue supportive care.  Endometrial cancer with intermittent bleeding and spotting.  Stable H&H, will type screen, outpatient follow-up with [facility] oncologist Dr. Berline Lopes once discharged.  History of duodenal ulcer with GI bleed.   -On Protonix, will make 2 times daily, hemoglobin has been stable, monitor closely as starting on DVT prophylaxis.  Borderline elevated D-dimer.   - Could be due to her endometrial  bleed history, negative lower extremity ultrasound. Tough situation due to recent history of upper GI bleed with ongoing intermittent bleed from her vagina.  But she is not much ambulatory, with COVID-19, elevated D-dimers and malignancy which puts her at significant hypercoagulable state and risk, given her GI bleed more than 4 weeks ago, and her hemoglobin has been stable, I will start her on subcu heparin for DVT prophylaxis, and I will increase her Protonix to twice daily.  Hypertension.  Continue combination of Norvasc, Coreg, increased hydralazine dose for better control.  Tapering steroids hopefully will help with blood pressure.  Intravascular dehydration with hypernatremia.  Gentle D5W and monitor.  Hypothermia.   -So far no clear etiology despite extensive work-up, I will repeat cortisol level, TSH and free T4, as well repeat blood cultures . - could be central in origin due to reset thermostat, TSH cortisol stable, appears nontoxic, negative blood cultures, stable procalcitonin, completely symptom-free, supportive care with bear hugger, increase room temperature and monitor, if improves outpatient endocrine follow-up.    Leg edema mostly nonpitting.  Will start on low-dose Lasix  MIcrocytic iron deficiency anemia.  On oral iron supplementation likely due to  ongoing intermittent vaginal blood loss, outpatient follow-up with PCP and GYN oncology.  Hypernatremia and hypokalemia.   -She is a significant hypokalemia today with potassium was 3, will replete  DM type II.  ISS.  Follow closely blood sugars as she is now on D5W and Decadron.  May require Lantus and adjustment of short-acting insulin.  Lab Results  Component Value Date   HGBA1C 5.7 (H) 09/15/2019   CBG (last 3)  Recent Labs    11/12/19 2141 11/13/19 0811 11/13/19 1156  GLUCAP 115* 129* 144*      Condition.  Extremely guarded.  Explained to patient and husband.    Family Communication  :  husband Lio [phone] on 5/5  Code Status :  Full  Disposition Plan  :    Status is: Inpatient  Remains inpatient appropriate because:Hemodynamically unstable   Dispo: The patient is from: SNF              Anticipated d/c is to: SNF              Anticipated d/c date is: > 3 days              Patient currently is not medically stable to d/c.   Consults  :  None  Procedures  :    DVT Prophylaxis  :  S Heparin     Lab Results  Component Value Date   PLT 129 (L) 11/13/2019    Diet :  Diet Order            Diet heart healthy/carb modified Room service appropriate? Yes; Fluid consistency: Thin  Diet effective now               Inpatient Medications Scheduled Meds: . albuterol  2 puff Inhalation Q4H  . amLODipine  10 mg Oral Daily  . vitamin C  500 mg Oral Daily  . carvedilol  12.5 mg Oral BID WC  . Chlorhexidine Gluconate Cloth  6 each Topical Daily  . doxazosin  4 mg Oral Daily  . feeding supplement (PRO-STAT SUGAR FREE 64)  30 mL Oral TID WC  . ferrous sulfate  325 mg Oral TID WC  . heparin injection (subcutaneous)  5,000 Units Subcutaneous Q8H  . hydrALAZINE  100 mg Oral Q8H  . insulin aspart  0-15 Units Subcutaneous TID WC  . insulin aspart  0-5 Units Subcutaneous QHS  . mouth rinse  15 mL Mouth Rinse BID  . pantoprazole sodium  40 mg Oral BID  .  polyethylene glycol  17 g Oral Daily  . zinc sulfate  220 mg Oral Daily   Continuous Infusions:  PRN Meds:.acetaminophen, capsaicin, guaiFENesin-dextromethorphan, loratadine, [DISCONTINUED] ondansetron **OR** ondansetron (ZOFRAN) IV, sodium chloride flush  Antibiotics  :   Anti-infectives (From admission, onward)   Start     Dose/Rate Route Frequency Ordered Stop   11/11/19 1000  doxycycline (VIBRA-TABS) tablet 100 mg  Status:  Discontinued     100 mg Oral Every 12 hours 11/11/19 0729 11/12/19 0921   11/09/19 1230  vancomycin (VANCOREADY) IVPB 1250 mg/250 mL  Status:  Discontinued     1,250 mg 166.7 mL/hr over 90 Minutes Intravenous Every 48 hours 11/07/19 1100 11/09/19 1318   11/08/19 1000  remdesivir 100 mg in sodium chloride 0.9 % 100 mL IVPB     100 mg 200 mL/hr over 30 Minutes Intravenous Daily 11/07/19 1217 11/11/19 1054   11/07/19 1600  piperacillin-tazobactam (ZOSYN) IVPB 3.375 g  Status:  Discontinued     3.375 g 12.5 mL/hr over 240 Minutes Intravenous Every 8 hours 11/07/19 1033 11/11/19 0729   11/07/19 1245  remdesivir 200 mg in sodium chloride 0.9% 250 mL IVPB     200 mg 580 mL/hr over 30 Minutes Intravenous Once 11/07/19 1216 11/07/19 1728   11/07/19 1130  vancomycin (VANCOREADY) IVPB 2000 mg/400 mL     2,000 mg 200 mL/hr over 120 Minutes Intravenous  Once 11/07/19 1053 11/07/19 1432   11/07/19 1100  piperacillin-tazobactam (ZOSYN) IVPB 3.375 g     3.375 g 100 mL/hr over 30 Minutes Intravenous  Once 11/07/19 1033 11/07/19 1303          Objective:   Vitals:   11/13/19 0914 11/13/19 1454 11/13/19 1456 11/13/19 1500  BP: (!) 143/68 (!) 177/73 (!) 177/73 (!) 151/83  Pulse: 60 60  68  Resp: 18 (!) 25  18  Temp:  (!) 94 F (34.4 C)    TempSrc:  Axillary    SpO2: 96% 91%  92%  Weight:      Height:        SpO2: 92 % O2 Flow Rate (L/min): 3 L/min  Wt Readings from Last 3 Encounters:  11/08/19 104.9 kg  10/18/19 100.3 kg  10/17/19 94.9 kg      Intake/Output Summary (Last 24 hours) at 11/13/2019 1603 Last data filed at 11/13/2019 1300 Gross per 24 hour  Intake 562 ml  Output 300 ml  Net 262 ml     Physical Exam  Awake Alert, Oriented X 3, No new F.N deficits, Normal affect Symmetrical Chest wall movement, Good air movement bilaterally, CTAB RRR,No Gallops,Rubs or new Murmurs, No Parasternal Heave +ve B.Sounds, Abd Soft, No tenderness, No rebound - guarding or rigidity. No Cyanosis, Clubbing .+2  edema, No new Rash or bruise       Data Review:    Recent Labs  Lab 11/09/19 0430 11/10/19 0410 11/11/19 0338 11/12/19 0519 11/13/19 0547  WBC 4.3 6.4 7.3 11.1* 8.0  HGB 8.6* 9.1* 9.4* 10.2* 9.5*  HCT 29.0* 30.5* 31.2* 33.6* 31.4*  PLT 144* 164 145* 147* 129*  MCV 97.6 96.2 97.8 96.6 96.0  MCH 29.0 28.7 29.5 29.3 29.1  MCHC 29.7* 29.8* 30.1 30.4 30.3  RDW 18.5* 18.4* 18.3* 18.3* 18.1*  LYMPHSABS 0.3* 0.3* 0.3*  0.3* 0.4*  MONOABS 0.3 0.3 0.2 0.4 0.4  EOSABS 0.0 0.0 0.0 0.0 0.0  BASOSABS 0.0 0.0 0.0 0.0 0.0    Recent Labs  Lab 11/06/19 2140 11/06/19 2140 11/07/19 0441 11/07/19 0441 11/08/19 0437 11/08/19 0437 11/08/19 0500 11/09/19 0430 11/10/19 0410 11/11/19 0338 11/12/19 0519 11/13/19 0547  NA 148*   < > 149*   < > 147*   < >  --  148* 145 148* 146* 146*  K 3.5   < > 3.2*   < > 3.9   < >  --  3.6 3.4* 3.5 3.1* 3.0*  CL 112*   < > 112*   < > 111   < >  --  111 108 110 108 110  CO2 26   < > 26   < > 25   < >  --  25 26 25 25 25   GLUCOSE 109*   < > 110*   < > 188*   < >  --  134* 169* 171* 130* 126*  BUN 28*   < > 28*   < > 31*   < >  --  37* 41* 43* 42* 44*  CREATININE 1.86*   < > 1.76*   < > 1.88*   < >  --  1.93* 1.85* 1.97* 1.79* 1.77*  CALCIUM 8.7*   < > 8.5*   < > 8.4*   < >  --  8.8* 8.5* 8.2* 8.3* 8.3*  AST 18   < > 15   < > 14*   < >  --  15 17 17 16  13*  ALT 5   < > 6   < > 7   < >  --  5 6 5 6 6   ALKPHOS 113   < > 103   < > 105   < >  --  100 91 90 89 98  BILITOT 0.6   < > 0.8   < > 0.8    < >  --  0.7 0.7 0.7 1.0 0.8  ALBUMIN 2.8*   < > 2.6*   < > 2.4*   < >  --  2.4* 2.4* 2.4* 2.5* 2.2*  MG  --   --  1.8   < > 1.9   < >  --  1.8 1.9 1.9 1.7 1.8  CRP 8.7*   < > 8.5*   < > 11.2*   < >  --  9.9* 7.1* 5.5* 4.4* 9.1*  DDIMER 1.54*   < > 1.31*   < > 1.34*   < >  --  2.00* 2.51* 2.47* 2.95* 2.39*  PROCALCITON 0.46  --  <0.10  --  <0.10  --   --  <0.10  --   --   --   --   INR  --   --   --   --  1.2  --   --   --   --   --   --   --   TSH 5.158*  --   --   --   --   --   --   --   --   --   --   --   BNP 656.0*  --   --   --   --   --  573.1* 1,043.9*  --   --   --   --    < > = values in this interval not  displayed.    Recent Labs  Lab 11/06/19 2140 11/06/19 2140 11/07/19 0441 11/07/19 0441 11/08/19 0437 11/08/19 0437 11/08/19 0500 11/09/19 0430 11/10/19 0410 11/11/19 0338 11/12/19 0519 11/13/19 0547  CRP 8.7*   < > 8.5*   < > 11.2*   < >  --  9.9* 7.1* 5.5* 4.4* 9.1*  DDIMER 1.54*   < > 1.31*   < > 1.34*   < >  --  2.00* 2.51* 2.47* 2.95* 2.39*  BNP 656.0*  --   --   --   --   --  573.1* 1,043.9*  --   --   --   --   PROCALCITON 0.46  --  <0.10  --  <0.10  --   --  <0.10  --   --   --   --    < > = values in this interval not displayed.    ------------------------------------------------------------------------------------------------------------------ No results for input(s): CHOL, HDL, LDLCALC, TRIG, CHOLHDL, LDLDIRECT in the last 72 hours.  Lab Results  Component Value Date   HGBA1C 5.7 (H) 09/15/2019   ------------------------------------------------------------------------------------------------------------------ No results for input(s): TSH, T4TOTAL, T3FREE, THYROIDAB in the last 72 hours.  Invalid input(s): FREET3 ------------------------------------------------------------------------------------------------------------------ No results for input(s): VITAMINB12, FOLATE, FERRITIN, TIBC, IRON, RETICCTPCT in the last 72 hours.  Coagulation  profile Recent Labs  Lab 11/08/19 0437  INR 1.2    Recent Labs    11/12/19 0519 11/13/19 0547  DDIMER 2.95* 2.39*    Cardiac Enzymes No results for input(s): CKMB, TROPONINI, MYOGLOBIN in the last 168 hours.  Invalid input(s): CK ------------------------------------------------------------------------------------------------------------------    Component Value Date/Time   BNP 1,043.9 (H) 11/09/2019 0430    Micro Results Recent Results (from the past 240 hour(s))  Respiratory Panel by RT PCR (Flu A&B, Covid) - Nasopharyngeal Swab     Status: Abnormal   Collection Time: 11/06/19 10:54 AM   Specimen: Nasopharyngeal Swab  Result Value Ref Range Status   SARS Coronavirus 2 by RT PCR POSITIVE (A) NEGATIVE Final    Comment: RESULT CALLED TO, READ BACK BY AND VERIFIED WITH: Marzella Schlein RN 12:35 11/06/19 ([email or web address]) (NOTE) SARS-CoV-2 target nucleic acids are DETECTED. SARS-CoV-2 RNA is generally detectable in upper respiratory specimens  during the acute phase of infection. Positive results are indicative of the presence of the identified virus, but do not rule out bacterial infection or co-infection with other pathogens not detected by the test. Clinical correlation with patient history and other diagnostic information is necessary to determine patient infection status. The expected result is Negative. Fact Sheet for Patients:  PinkCheek.be Fact Sheet for Healthcare Providers: GravelBags.it This test is not yet approved or cleared by the [place] FDA and  has been authorized for detection and/or diagnosis of SARS-CoV-2 by FDA under an Emergency Use Authorization (EUA).  This EUA will remain in effect (meaning this test can be used)  for the duration of  the COVID-19 declaration under Section 564(b)(1) of the Act, 21 U.S.C. section 360bbb-3(b)(1), unless the authorization is terminated or revoked sooner.     Influenza A by PCR NEGATIVE NEGATIVE Final   Influenza B by PCR NEGATIVE NEGATIVE Final    Comment: (NOTE) The Xpert Xpress SARS-CoV-2/FLU/RSV assay is intended as an aid in  the diagnosis of influenza from Nasopharyngeal swab specimens and  should not be used as a sole basis for treatment. Nasal washings and  aspirates are unacceptable for Xpert Xpress SARS-CoV-2/FLU/RSV  testing. Fact Sheet for Patients: PinkCheek.be Fact Sheet for  Healthcare Providers: GravelBags.it This test is not yet approved or cleared by the [place] and  has been authorized for detection and/or diagnosis of SARS-CoV-2 by  FDA under an Emergency Use Authorization (EUA). This EUA will remain  in effect (meaning this test can be used) for the duration of the  Covid-19 declaration under Section 564(b)(1) of the Act, 21  U.S.C. section 360bbb-3(b)(1), unless the authorization is  terminated or revoked. Performed at [facility], [place] [address] [place], [place] [address]   Culture, blood (Routine X 2) w Reflex to ID Panel     Status: None   Collection Time: 11/06/19  9:40 PM   Specimen: BLOOD  Result Value Ref Range Status   Specimen Description BLOOD RIGHT ANTECUBITAL  Final   Special Requests   Final    BOTTLES DRAWN AEROBIC AND ANAEROBIC Blood Culture adequate volume   Culture   Final    NO GROWTH 5 DAYS Performed at [facility], [place] [address] [place], [place] [address]    Report Status 11/11/2019 FINAL  Final  Culture, blood (Routine X 2) w Reflex to ID Panel     Status: None   Collection Time: 11/06/19  9:49 PM   Specimen: BLOOD  Result Value Ref Range Status   Specimen Description BLOOD LEFT ANTECUBITAL  Final   Special Requests   Final    BOTTLES DRAWN AEROBIC ONLY Blood Culture adequate volume   Culture   Final    NO GROWTH 5 DAYS Performed at [facility], [place] [address], [place], [place] [address]     Report Status 11/11/2019 FINAL  Final    Radiology Reports CT ABDOMEN PELVIS WO CONTRAST  Result Date: 11/07/2019 CLINICAL DATA:  Generalized weakness and lethargy. Sepsis. Ascites. Cough. COVID positive yesterday. EXAM: CT CHEST, ABDOMEN AND PELVIS WITHOUT CONTRAST TECHNIQUE: Multidetector CT imaging of the chest, abdomen and pelvis was performed following the standard protocol without IV contrast. COMPARISON:  Radiograph yesterday. Abdominopelvic CT 09/15/2019 FINDINGS: CT CHEST FINDINGS Cardiovascular: Right upper extremity PICC with tip in the SVC. Motion artifact partially obscures detailed evaluation. Normal caliber thoracic aorta. Mild atherosclerosis. Mild cardiomegaly. No pericardial effusion. Mediastinum/Nodes: Motion and lack of contrast limits assessment for adenopathy. Small mediastinal lymph nodes not definitively enlarged. Assessment for hilar adenopathy is significantly limited. Esophagus is patulous. No suspicious thyroid nodule. Lungs/Pleura: Moderate right and small left pleural effusion. Adjacent compressive atelectasis. There are multifocal geographic patchy ground-glass opacities throughout both lungs. Trachea and mainstem bronchi are grossly patent, partially motion obscured. Musculoskeletal: Generalized subcutaneous edema throughout the chest wall. Diffuse degenerative change throughout the thoracic spine. There are no acute or suspicious osseous abnormalities. CT ABDOMEN PELVIS FINDINGS Hepatobiliary: No evidence of focal hepatic abnormality on noncontrast exam. Liver is prominent size spanning 20 cm cranial caudal. Question of nodular hepatic contours. Hyperdense gallbladder contents. No calcified gallstone. Common bile duct is not well-defined. Pancreas: No pancreatic ductal dilatation. No disproportionate peripancreatic edema. Spleen: Normal in size without focal abnormality. Adrenals/Urinary Tract: Adrenal thickening without dominant nodule. Chronic left renal atrophy. Left ureter  is nondilated, unchanged 7 mm calcification in the left pelvis equivocally within the ureter. Right kidney appears normal in size. No hydronephrosis or renal calculi. Urinary bladder is distended and unremarkable. Stomach/Bowel: Bowel evaluation is limited in the absence of contrast, presence of ascites, and motion artifact. Nondistended stomach, grossly unremarkable. No small bowel obstruction. Appendix not visualized. Moderate volume of stool throughout the colon. There is  mild colonic redundancy. No obvious colonic wall thickening or pericolonic inflammation. Vascular/Lymphatic: Abdominal aorta is normal in caliber. No bulky abdominopelvic adenopathy. Reproductive: Uterus is slightly bulky. Limited assessment for adnexal mass given pelvic ascites. Other: Small to moderate volume abdominopelvic ascites. There is generalized edema of the intra-abdominal fat. Prominent generalized edema of the subcutaneous fat, confluent in the flanks. No free intra-abdominal air. Musculoskeletal: Degenerative change in the lumbar spine. There are no acute or suspicious osseous abnormalities. IMPRESSION: 1. Multifocal geographic patchy ground-glass opacities throughout both lungs, consistent with COVID-19 pneumonia. 2. Fluid overload. Moderate right and small left pleural effusion. Small to moderate volume abdominopelvic ascites. Generalized subcutaneous edema of the chest and abdomen consistent with third-spacing, confluent edema in the flanks. 3. Question of nodular hepatic contours, recommend correlation for cirrhosis. 4. Chronic left renal atrophy. Unchanged 7 mm calcification in the left pelvis equivocally within the ureter. No hydronephrosis. Aortic Atherosclerosis (ICD10-I70.0). Electronically Signed   By: Keith Rake M.D.   On: 11/07/2019 23:00   CT CHEST WO CONTRAST  Result Date: 11/07/2019 CLINICAL DATA:  Generalized weakness and lethargy. Sepsis. Ascites. Cough. COVID positive yesterday. EXAM: CT CHEST, ABDOMEN  AND PELVIS WITHOUT CONTRAST TECHNIQUE: Multidetector CT imaging of the chest, abdomen and pelvis was performed following the standard protocol without IV contrast. COMPARISON:  Radiograph yesterday. Abdominopelvic CT 09/15/2019 FINDINGS: CT CHEST FINDINGS Cardiovascular: Right upper extremity PICC with tip in the SVC. Motion artifact partially obscures detailed evaluation. Normal caliber thoracic aorta. Mild atherosclerosis. Mild cardiomegaly. No pericardial effusion. Mediastinum/Nodes: Motion and lack of contrast limits assessment for adenopathy. Small mediastinal lymph nodes not definitively enlarged. Assessment for hilar adenopathy is significantly limited. Esophagus is patulous. No suspicious thyroid nodule. Lungs/Pleura: Moderate right and small left pleural effusion. Adjacent compressive atelectasis. There are multifocal geographic patchy ground-glass opacities throughout both lungs. Trachea and mainstem bronchi are grossly patent, partially motion obscured. Musculoskeletal: Generalized subcutaneous edema throughout the chest wall. Diffuse degenerative change throughout the thoracic spine. There are no acute or suspicious osseous abnormalities. CT ABDOMEN PELVIS FINDINGS Hepatobiliary: No evidence of focal hepatic abnormality on noncontrast exam. Liver is prominent size spanning 20 cm cranial caudal. Question of nodular hepatic contours. Hyperdense gallbladder contents. No calcified gallstone. Common bile duct is not well-defined. Pancreas: No pancreatic ductal dilatation. No disproportionate peripancreatic edema. Spleen: Normal in size without focal abnormality. Adrenals/Urinary Tract: Adrenal thickening without dominant nodule. Chronic left renal atrophy. Left ureter is nondilated, unchanged 7 mm calcification in the left pelvis equivocally within the ureter. Right kidney appears normal in size. No hydronephrosis or renal calculi. Urinary bladder is distended and unremarkable. Stomach/Bowel: Bowel evaluation  is limited in the absence of contrast, presence of ascites, and motion artifact. Nondistended stomach, grossly unremarkable. No small bowel obstruction. Appendix not visualized. Moderate volume of stool throughout the colon. There is mild colonic redundancy. No obvious colonic wall thickening or pericolonic inflammation. Vascular/Lymphatic: Abdominal aorta is normal in caliber. No bulky abdominopelvic adenopathy. Reproductive: Uterus is slightly bulky. Limited assessment for adnexal mass given pelvic ascites. Other: Small to moderate volume abdominopelvic ascites. There is generalized edema of the intra-abdominal fat. Prominent generalized edema of the subcutaneous fat, confluent in the flanks. No free intra-abdominal air. Musculoskeletal: Degenerative change in the lumbar spine. There are no acute or suspicious osseous abnormalities. IMPRESSION: 1. Multifocal geographic patchy ground-glass opacities throughout both lungs, consistent with COVID-19 pneumonia. 2. Fluid overload. Moderate right and small left pleural effusion. Small to moderate volume abdominopelvic ascites. Generalized subcutaneous edema of the chest and  abdomen consistent with third-spacing, confluent edema in the flanks. 3. Question of nodular hepatic contours, recommend correlation for cirrhosis. 4. Chronic left renal atrophy. Unchanged 7 mm calcification in the left pelvis equivocally within the ureter. No hydronephrosis. Aortic Atherosclerosis (ICD10-I70.0). Electronically Signed   By: Keith Rake M.D.   On: 11/07/2019 23:00   DG Chest Port 1 View  Result Date: 11/09/2019 CLINICAL DATA:  Weakness, COVID-19 positive EXAM: PORTABLE CHEST 1 VIEW COMPARISON:  Chest radiograph from one day prior. FINDINGS: Right PICC terminates in the lower third of the SVC. Stable cardiomediastinal silhouette with mild cardiomegaly. No pneumothorax. No pleural effusion. Extensive patchy opacities throughout both lungs, slightly worsened in the upper lungs.  IMPRESSION: Extensive patchy opacities throughout both lungs, slightly worsened in the upper lungs, compatible with COVID-19 pneumonia, although with component of pulmonary edema not excluded. Mild cardiomegaly. Electronically Signed   By: Ilona Sorrel M.D.   On: 11/09/2019 08:19   DG Chest Port 1 View  Result Date: 11/08/2019 CLINICAL DATA:  Shortness of breath, COVID-19, diabetes mellitus, hypertension, stage III chronic kidney disease, history endometrial cancer EXAM: PORTABLE CHEST 1 VIEW COMPARISON:  Portable exam 0816 hours compared to 11/06/2019 FINDINGS: RIGHT arm PICC line tip projects over SVC. Enlargement of cardiac silhouette with stable mediastinal contours. Extensive BILATERAL pulmonary infiltrates greatest perihilar on RIGHT, question asymmetric pulmonary edema versus multifocal infection. No pleural effusion or pneumothorax. Bones demineralized with scattered degenerative changes of the thoracic spine. IMPRESSION: Enlargement of cardiac silhouette with increased BILATERAL pulmonary infiltrates greatest RIGHT perihilar question asymmetric pulmonary edema versus multifocal pneumonia. Electronically Signed   By: Lavonia Dana M.D.   On: 11/08/2019 08:51   DG CHEST PORT 1 VIEW  Result Date: 11/06/2019 CLINICAL DATA:  Weakness. EXAM: PORTABLE CHEST 1 VIEW COMPARISON:  November 05, 2019. FINDINGS: Stable cardiomegaly with possible central pulmonary vascular congestion. No pneumothorax is noted. Mild bibasilar atelectasis is noted with small pleural effusions. Bony thorax is unremarkable. IMPRESSION: Stable cardiomegaly with possible central pulmonary vascular congestion. Mild bibasilar subsegmental atelectasis is noted with small pleural effusions. Electronically Signed   By: Marijo Conception M.D.   On: 11/06/2019 15:57   DG Chest Port 1 View  Result Date: 11/05/2019 CLINICAL DATA:  Weakness. Found on floor today. EXAM: PORTABLE CHEST 1 VIEW COMPARISON:  10/18/2019 FINDINGS: Stable heart size and  mediastinal contours with mild cardiomegaly. Hazy opacity at both lung bases likely combination of pleural fluid and airspace disease/atelectasis. Vascular congestion. No pneumothorax. Left proximal humerus fracture appears subacute with surrounding heterotopic ossification, also seen on 09/17/2019 shoulder radiograph. IMPRESSION: 1. Hazy opacity at both lung bases likely combination of pleural fluid and airspace disease/atelectasis. This is stable on the right but progressive on the left from prior. 2. Stable mild cardiomegaly.  Vascular congestion. Electronically Signed   By: Keith Rake M.D.   On: 11/05/2019 17:02   DG Chest Portable 1 View  Result Date: 10/18/2019 CLINICAL DATA:  Hypertension and shortness of breath EXAM: PORTABLE CHEST 1 VIEW COMPARISON:  September 17, 2019 FINDINGS: Ill-defined opacity in the right mid and lower lung regions noted with suspected small superimposed pleural effusion. There is mild left base atelectasis. Left lung otherwise clear. Heart is mildly enlarged with pulmonary vascularity normal. No adenopathy. There is degenerative change in the thoracic spine. IMPRESSION: Ill-defined opacity right mid and lower lung zones, likely due to a degree of pneumonia with superimposed small pleural effusion. Mild left base atelectasis. Stable cardiac prominence. No adenopathy evident. Electronically Signed  By: Lowella Grip III M.D.   On: 10/18/2019 14:06   VAS [place] LOWER EXTREMITY VENOUS (DVT)  Result Date: 11/07/2019  Lower Venous DVTStudy Indications: Swelling, and Edema.  Limitations: Body habitus and poor ultrasound/tissue interface. Comparison Study: 09/16/19 previous Performing Technologist: Abram Sander RVS  Examination Guidelines: A complete evaluation includes B-mode imaging, spectral Doppler, color Doppler, and power Doppler as needed of all accessible portions of each vessel. Bilateral testing is considered an integral part of a complete examination. Limited examinations  for reoccurring indications may be performed as noted. The reflux portion of the exam is performed with the patient in reverse Trendelenburg.  +---------+---------------+---------+-----------+----------+--------------+ RIGHT    CompressibilityPhasicitySpontaneityPropertiesThrombus Aging +---------+---------------+---------+-----------+----------+--------------+ CFV      Full           Yes      Yes                                 +---------+---------------+---------+-----------+----------+--------------+ SFJ      Full                                                        +---------+---------------+---------+-----------+----------+--------------+ FV Prox  Full                                                        +---------+---------------+---------+-----------+----------+--------------+ FV Mid                                                Not visualized +---------+---------------+---------+-----------+----------+--------------+ FV Distal               Yes      Yes                                 +---------+---------------+---------+-----------+----------+--------------+ PFV      Full                                                        +---------+---------------+---------+-----------+----------+--------------+ POP      Full           Yes      Yes                                 +---------+---------------+---------+-----------+----------+--------------+ PTV                                                   Not visualized +---------+---------------+---------+-----------+----------+--------------+ PERO  Not visualized +---------+---------------+---------+-----------+----------+--------------+   +---------+---------------+---------+-----------+----------+--------------+ LEFT     CompressibilityPhasicitySpontaneityPropertiesThrombus Aging  +---------+---------------+---------+-----------+----------+--------------+ CFV      Full           Yes      Yes                                 +---------+---------------+---------+-----------+----------+--------------+ SFJ      Full                                                        +---------+---------------+---------+-----------+----------+--------------+ FV Prox  Full                                                        +---------+---------------+---------+-----------+----------+--------------+ FV Mid                  Yes      Yes                                 +---------+---------------+---------+-----------+----------+--------------+ FV Distal               Yes      Yes                                 +---------+---------------+---------+-----------+----------+--------------+ PFV      Full                                                        +---------+---------------+---------+-----------+----------+--------------+ POP      Full           Yes      Yes                                 +---------+---------------+---------+-----------+----------+--------------+ PTV      Full                                                        +---------+---------------+---------+-----------+----------+--------------+ PERO                                                  Not visualized +---------+---------------+---------+-----------+----------+--------------+     Summary: BILATERAL: - No evidence of deep vein thrombosis seen in the lower extremities, bilaterally.   *See table(s) above for measurements and observations. Electronically signed by Curt Jews MD on 11/07/2019 at 4:26:57 PM.    Final    [place] EKG SITE RITE  Result Date: 11/07/2019  If [employer] not attached, placement could not be confirmed due to current cardiac rhythm.    Phillips Climes M.D on 11/13/2019 at 4:03 PM  To page go to [email or web address] - password [facility]

## 2019-11-14 DIAGNOSIS — E870 Hyperosmolality and hypernatremia: Secondary | ICD-10-CM

## 2019-11-14 DIAGNOSIS — T68XXXD Hypothermia, subsequent encounter: Secondary | ICD-10-CM

## 2019-11-14 LAB — CBC WITH DIFFERENTIAL/PLATELET
Abs Immature Granulocytes: 0.06 10*3/uL (ref 0.00–0.07)
Basophils Absolute: 0 10*3/uL (ref 0.0–0.1)
Basophils Relative: 0 %
Eosinophils Absolute: 0 10*3/uL (ref 0.0–0.5)
Eosinophils Relative: 1 %
HCT: 29.5 % — ABNORMAL LOW (ref 36.0–46.0)
Hemoglobin: 8.9 g/dL — ABNORMAL LOW (ref 12.0–15.0)
Immature Granulocytes: 1 %
Lymphocytes Relative: 7 %
Lymphs Abs: 0.4 10*3/uL — ABNORMAL LOW (ref 0.7–4.0)
MCH: 28.9 pg (ref 26.0–34.0)
MCHC: 30.2 g/dL (ref 30.0–36.0)
MCV: 95.8 fL (ref 80.0–100.0)
Monocytes Absolute: 0.2 10*3/uL (ref 0.1–1.0)
Monocytes Relative: 4 %
Neutro Abs: 4.3 10*3/uL (ref 1.7–7.7)
Neutrophils Relative %: 87 %
Platelets: 101 10*3/uL — ABNORMAL LOW (ref 150–400)
RBC: 3.08 MIL/uL — ABNORMAL LOW (ref 3.87–5.11)
RDW: 18.2 % — ABNORMAL HIGH (ref 11.5–15.5)
WBC: 5 10*3/uL (ref 4.0–10.5)
nRBC: 0.4 % — ABNORMAL HIGH (ref 0.0–0.2)

## 2019-11-14 LAB — COMPREHENSIVE METABOLIC PANEL
ALT: 6 U/L (ref 0–44)
AST: 12 U/L — ABNORMAL LOW (ref 15–41)
Albumin: 2.2 g/dL — ABNORMAL LOW (ref 3.5–5.0)
Alkaline Phosphatase: 95 U/L (ref 38–126)
Anion gap: 9 (ref 5–15)
BUN: 48 mg/dL — ABNORMAL HIGH (ref 8–23)
CO2: 25 mmol/L (ref 22–32)
Calcium: 8 mg/dL — ABNORMAL LOW (ref 8.9–10.3)
Chloride: 113 mmol/L — ABNORMAL HIGH (ref 98–111)
Creatinine, Ser: 1.67 mg/dL — ABNORMAL HIGH (ref 0.44–1.00)
GFR calc Af Amer: 37 mL/min — ABNORMAL LOW (ref >60)
GFR calc non Af Amer: 32 mL/min — ABNORMAL LOW (ref >60)
Glucose, Bld: 138 mg/dL — ABNORMAL HIGH (ref 70–99)
Potassium: 3.7 mmol/L (ref 3.5–5.1)
Sodium: 147 mmol/L — ABNORMAL HIGH (ref 135–145)
Total Bilirubin: 0.8 mg/dL (ref 0.3–1.2)
Total Protein: 5.8 g/dL — ABNORMAL LOW (ref 6.5–8.1)

## 2019-11-14 LAB — GLUCOSE, CAPILLARY
Glucose-Capillary: 102 mg/dL — ABNORMAL HIGH (ref 70–99)
Glucose-Capillary: 136 mg/dL — ABNORMAL HIGH (ref 70–99)
Glucose-Capillary: 108 mg/dL — ABNORMAL HIGH (ref 70–99)
Glucose-Capillary: 159 mg/dL — ABNORMAL HIGH (ref 70–99)

## 2019-11-14 LAB — MAGNESIUM: Magnesium: 1.8 mg/dL (ref 1.7–2.4)

## 2019-11-14 LAB — C-REACTIVE PROTEIN: CRP: 8.8 mg/dL — ABNORMAL HIGH (ref <1.0)

## 2019-11-14 LAB — D-DIMER, QUANTITATIVE: D-Dimer, Quant: 1.85 ug/mL-FEU — ABNORMAL HIGH (ref 0.00–0.50)

## 2019-11-14 MED ORDER — POTASSIUM CHLORIDE CRYS ER 20 MEQ PO TBCR
40.0000 meq | EXTENDED_RELEASE_TABLET | Freq: Once | ORAL | Status: AC
  Administered 2019-11-14: 40 meq via ORAL
  Filled 2019-11-14: qty 2

## 2019-11-14 MED ORDER — MAGNESIUM SULFATE IN D5W 1-5 GM/100ML-% IV SOLN
1.0000 g | Freq: Once | INTRAVENOUS | Status: AC
  Administered 2019-11-14: 1 g via INTRAVENOUS
  Filled 2019-11-14 (×2): qty 100

## 2019-11-14 MED ORDER — FUROSEMIDE 10 MG/ML IJ SOLN
40.0000 mg | Freq: Every day | INTRAMUSCULAR | Status: DC
  Administered 2019-11-14: 40 mg via INTRAVENOUS
  Filled 2019-11-14: qty 4

## 2019-11-14 MED ORDER — HYDROCHLOROTHIAZIDE 12.5 MG PO CAPS
12.5000 mg | ORAL_CAPSULE | Freq: Every day | ORAL | Status: DC
  Administered 2019-11-14 – 2019-11-15 (×2): 12.5 mg via ORAL
  Filled 2019-11-14 (×2): qty 1

## 2019-11-14 MED ORDER — HYDRALAZINE HCL 50 MG PO TABS
50.0000 mg | ORAL_TABLET | Freq: Three times a day (TID) | ORAL | Status: DC
  Administered 2019-11-14 – 2019-11-18 (×12): 50 mg via ORAL
  Filled 2019-11-14 (×12): qty 1

## 2019-11-14 NOTE — Progress Notes (Signed)
Physical Therapy Treatment Note  Patient continues to require two person assist with use of Stedy for transfers bed>chair. Increased standing tolerance in Stedy this session compared to last. Patient tolerated standing upright in Stedy approx 2 minutes. Patient continues to demonstrate decreased strength, decreased activity tolerance, and is below her PLOF. Recommend continued skilled PT services and discharge to SNF for short term rehabilitation.    11/14/19 0939  PT Visit Information  Last PT Received On 11/14/19  Assistance Needed +2  PT/OT/SLP Co-Evaluation/Treatment Yes  Reason for Co-Treatment Complexity of the patient's impairments (multi-system involvement);For patient/therapist safety  PT goals addressed during session Mobility/safety with mobility;Balance;Proper use of DME  History of Present Illness 64 year old female admitted 11/05/19 with inability to care for herself at home. Patient was recently discharged from SNF and has been unable to care for herself since. She was found to be COVID+. Hypothermia of unclear etiology. PMH includes HTN, DM, CKD, and endometrial cancer.   Precautions  Precautions Fall;Other (comment)  Precaution Comments RUE PICC  Pain Assessment  Pain Assessment No/denies pain (No complaints of pain. No signs/symptoms of pain)  Cognition  Arousal/Alertness Awake/alert  Bed Mobility  Overal bed mobility Needs Assistance  Bed Mobility Supine to Sit  Supine to sit Mod assist;Max assist;HOB elevated;+2 for physical assistance  Transfers  Overall transfer level Needs assistance  Transfer via [employer] Transfers;Sit to/from Stand  Sit to Stand Mod assist;+2 physical assistance;+2 safety/equipment  Stand pivot transfers Mod assist;+2 physical assistance;+2 safety/equipment;From elevated surface  General transfer comment Use of Stedy for transfer EOB>chair with bed height increased slightly. MinA for sit>stand from [facility]  "seat." ModA x 2 for sit<>stand from recliner chair using Stedy.   Balance  Overall balance assessment Needs assistance  Sitting-balance support Feet supported;Bilateral upper extremity supported  Sitting balance-Leahy Scale Fair  Standing balance comment Static standing balance with Stedy with close supervision/CGA. Standing tolerance approx 1-2 minutes x 2 trials.  General Comments  General comments (skin integrity, edema, etc.) Patient on 1L [place]. O2 saturation 93%, HR 74 bpm, RR 20 at start of session. O2 saturation 90% when progressing toward EOB. BP at end of session in recliner chair: 161/77, HR 71 bpm.  PT - End of Session  Equipment Utilized During Treatment Oxygen;Gait belt  Activity Tolerance Patient tolerated treatment well;Patient limited by fatigue  Patient left in chair;with call bell/phone within reach;with chair alarm set;Other (comment) (OT in room)  Nurse Communication Mobility status;Need for lift equipment   PT - Assessment/Plan  PT Plan Current plan remains appropriate  PT Visit Diagnosis Unsteadiness on feet (R26.81);Difficulty in walking, not elsewhere classified (R26.2);Pain  PT Frequency (ACUTE ONLY) Min 2X/week  Follow Up Recommendations SNF;Supervision/Assistance - 24 hour  PT equipment Other (comment) (TBD at next level of care)  AM-PAC PT "6 Clicks" Mobility Outcome Measure (Version 2)  Help needed turning from your back to your side while in a flat bed without using bedrails? 2  Help needed moving from lying on your back to sitting on the side of a flat bed without using bedrails? 2  Help needed moving to and from a bed to a chair (including a wheelchair)? 1  Help needed standing up from a chair using your arms (e.g., wheelchair or bedside chair)? 1  Help needed to walk in hospital room? 1  Help needed climbing 3-5 steps with a railing?  1  6 Click Score 8  Consider Recommendation of Discharge To: CIR/SNF/LTACH  PT Goal Progression  Progress towards PT  goals Progressing toward goals  PT Time Calculation  PT Start Time (ACUTE ONLY) 0939  PT Stop Time (ACUTE ONLY) 1025  PT Time Calculation (min) (ACUTE ONLY) 46 min  PT General Charges  $$ ACUTE PT VISIT 1 Visit  PT Treatments  $Therapeutic Activity 8-22 mins   Birdie Hopes, PT, DPT Acute Rehab [phone] office

## 2019-11-14 NOTE — Plan of Care (Signed)
  Problem: Education: Goal: Knowledge of General Education information will improve Description: Including pain rating scale, medication(s)/side effects and non-pharmacologic comfort measures Outcome: Progressing   Problem: Health Behavior/Discharge Planning: Goal: Ability to manage health-related needs will improve Outcome: Not Progressing Note: Non-compliant with prescribed treatments   Problem: Clinical Measurements: Goal: Ability to maintain clinical measurements within normal limits will improve Outcome: Progressing Goal: Will remain free from infection Outcome: Progressing Goal: Diagnostic test results will improve Outcome: Progressing Goal: Respiratory complications will improve Outcome: Progressing Goal: Cardiovascular complication will be avoided Outcome: Progressing   Problem: Activity: Goal: Risk for activity intolerance will decrease Outcome: Progressing   Problem: Nutrition: Goal: Adequate nutrition will be maintained Outcome: Progressing   Problem: Coping: Goal: Level of anxiety will decrease Outcome: Progressing   Problem: Elimination: Goal: Will not experience complications related to bowel motility Outcome: Progressing Goal: Will not experience complications related to urinary retention Outcome: Progressing   Problem: Pain Managment: Goal: General experience of comfort will improve Outcome: Progressing   Problem: Safety: Goal: Ability to remain free from injury will improve Outcome: Progressing   Problem: Skin Integrity: Goal: Risk for impaired skin integrity will decrease Outcome: Progressing   Problem: Education: Goal: Knowledge of risk factors and measures for prevention of condition will improve Outcome: Progressing   Problem: Coping: Goal: Psychosocial and spiritual needs will be supported Outcome: Progressing   Problem: Respiratory: Goal: Will maintain a patent airway Outcome: Progressing Goal: Complications related to the disease  process, condition or treatment will be avoided or minimized Outcome: Progressing   Problem: Education: Goal: Ability to demonstrate management of disease process will improve Outcome: Progressing Goal: Ability to verbalize understanding of medication therapies will improve Outcome: Progressing Goal: Individualized Educational Video(s) Outcome: Progressing   Problem: Activity: Goal: Capacity to carry out activities will improve Outcome: Progressing   Problem: Cardiac: Goal: Ability to achieve and maintain adequate cardiopulmonary perfusion will improve Outcome: Progressing   Problem: Education: Goal: Knowledge of disease and its progression will improve Outcome: Progressing Goal: Individualized Educational Video(s) Outcome: Progressing   Problem: Fluid Volume: Goal: Compliance with measures to maintain balanced fluid volume will improve Outcome: Progressing   Problem: Health Behavior/Discharge Planning: Goal: Ability to manage health-related needs will improve Outcome: Progressing   Problem: Nutritional: Goal: Ability to make healthy dietary choices will improve Outcome: Progressing   Problem: Clinical Measurements: Goal: Complications related to the disease process, condition or treatment will be avoided or minimized Outcome: Progressing

## 2019-11-14 NOTE — Progress Notes (Signed)
PROGRESS NOTE                                                                                                                                                                                                             Patient Demographics:    Vanessa [name], is a 64 y.o. female, DOB - 04/23/1956, VHQ:[id number]  Admit date - 11/05/2019   Admitting Physician Mckinley Jewel, MD  Outpatient Primary MD for the patient is Maren Reamer, MD (Inactive)  LOS - 8  CC - fever     Brief Narrative    -Vanessa [name] is a 64 y.o. female with medical history significant of hypertension, diabetes, hyperlipidemia, grade 2 diastolic congestive heart failure, CKD stage IIIb, endometrial cancer presents to emergency department due to generalized weakness and lethargy, she was diagnosed with COVID-19 pneumonia with sepsis and hypothermia and admitted to the hospital.    She was recently hospitalized for MSSA bacteremia few weeks ago.  She also carries a history of endometrial cancer under the care of Dr. Berline Lopes GYN oncology deemed a poor surgical candidate for now, also recent GI bleed due to duodenal ulcer.  She has completely recovered from COVID-19 pneumonia her only issue now is persistent hypothermia of unclear etiology, stable TSH and cortisol, appears completely nontoxic, blood cultures negative stable procalcitonin.  Continue providing supportive care.   Subjective:   Patient in bed, appears comfortable, denies any headache, reports some generalized weakness, she had intermittent hypothermia yesterday where she required warming blanket.   Assessment  & Plan :     Sepsis due COVID 19 infection -  Etiology not entirely clear, she is hypothermic without leukocytosis, does have COVID-19 infection and mild pneumonia this admission , she recently had MSSA bacteremia few weeks ago. - At this point repeat blood cultures were ordered and stayed negative, stable procalcitonin, cefazolin has  been stopped, she has completed her remdesivir and steroid course for COVID-19 pneumonia, CT chest abdomen pelvis nonacute.  Continue supportive care. -Reports she received the first dose of Covid vaccine last March, second 1 remains due.  Endometrial cancer with intermittent bleeding and spotting.   - Stable H&H, will type screen, outpatient follow-up with [facility] oncologist Dr. Berline Lopes once discharged.  History of duodenal ulcer with GI bleed.   -She is on PPI, have increased to twice daily, especially as she is being started on DVT prophylaxis dose .  Borderline elevated D-dimer.   - Could be due to her endometrial bleed  history, negative lower extremity ultrasound. Tough situation due to recent history of upper GI bleed with ongoing intermittent bleed from her vagina.  But she is not much ambulatory, with COVID-19, elevated D-dimers and malignancy which puts her at significant hypercoagulable state and risk, given her GI bleed more than 4 weeks ago, and her hemoglobin has been stable, I did start her on subcu heparin for DVT prophylaxis, and increased her Protonix to twice daily.  Hypertension.   -Blood pressure overall is elevated, her regimen has been changed multiple times during recent admissions given its hard to control . -For now I will continue with Norvasc, doxazosin, I have stopped her Coreg and decrease hydralazine to see if this helps with hypothermia please see discussion below .  Intravascular dehydration with hypernatremia.  -Currently appears to be volume overloaded, will monitor closely as on IV diuresis.  Hypothermia.   -So far no clear etiology despite extensive work-up, this of infectious process currently with stable procalcitonin, TSH, free T4 and cortisol level and negative blood cultures. - could be central in origin due to reset thermostat, TSH cortisol stable, appears nontoxic, negative blood cultures, stable procalcitonin, completely symptom-free, supportive care with  bear hugger, increase room temperature and monitor. -Her medication regimen has been adjusted recently, I have stopped her beta-blocker as case reports of its causing hypothermia, as well I have decreased her hydralazine to see if it helps with her hypothermia, given its vasodilatory effect.   Leg edema mostly nonpitting.  -Started on IV Lasix.  MIcrocytic iron deficiency anemia.  On oral iron supplementation likely due to ongoing intermittent vaginal blood loss, outpatient follow-up with PCP and GYN oncology.  Hypernatremia and hypokalemia.   -She is a significant hypokalemia today with potassium was 3, will replete  DM type II.  ISS.  Follow closely blood sugars as she is now on D5W and Decadron.  May require Lantus and adjustment of short-acting insulin.  Lab Results  Component Value Date   HGBA1C 5.7 (H) 09/15/2019   CBG (last 3)  Recent Labs    11/13/19 1616 11/13/19 2134 11/14/19 0744  GLUCAP 145* 127* 102*      Condition.  Extremely guarded.   Family Communication  :  husband Canniff [phone] on 5/5  Code Status :  Full  Disposition Plan  :    Status is: Inpatient  Remains inpatient appropriate because:Hemodynamically unstable, remains with significant hypothermia   Dispo: The patient is from: SNF              Anticipated d/c is to: SNF              Anticipated d/c date is: > 3 days              Patient currently is not medically stable to d/c.   Consults  :  None  Procedures  :    DVT Prophylaxis  :  S Heparin     Lab Results  Component Value Date   PLT 101 (L) 11/14/2019    Diet :  Diet Order            Diet heart healthy/carb modified Room service appropriate? Yes; Fluid consistency: Thin  Diet effective now               Inpatient Medications Scheduled Meds: . albuterol  2 puff Inhalation Q4H  . amLODipine  10 mg Oral Daily  . vitamin C  500 mg Oral Daily  . Chlorhexidine Gluconate Cloth  6 each Topical Daily  . doxazosin  4 mg  Oral Daily  . feeding supplement (PRO-STAT SUGAR FREE 64)  30 mL Oral TID WC  . ferrous sulfate  325 mg Oral TID WC  . furosemide  40 mg Intravenous Daily  . heparin injection (subcutaneous)  5,000 Units Subcutaneous Q8H  . hydrALAZINE  50 mg Oral Q8H  . hydrochlorothiazide  12.5 mg Oral Daily  . insulin aspart  0-15 Units Subcutaneous TID WC  . insulin aspart  0-5 Units Subcutaneous QHS  . mouth rinse  15 mL Mouth Rinse BID  . pantoprazole sodium  40 mg Oral BID  . polyethylene glycol  17 g Oral Daily  . zinc sulfate  220 mg Oral Daily   Continuous Infusions:  PRN Meds:.acetaminophen, capsaicin, guaiFENesin-dextromethorphan, loratadine, [DISCONTINUED] ondansetron **OR** ondansetron (ZOFRAN) IV, sodium chloride flush  Antibiotics  :   Anti-infectives (From admission, onward)   Start     Dose/Rate Route Frequency Ordered Stop   11/11/19 1000  doxycycline (VIBRA-TABS) tablet 100 mg  Status:  Discontinued     100 mg Oral Every 12 hours 11/11/19 0729 11/12/19 0921   11/09/19 1230  vancomycin (VANCOREADY) IVPB 1250 mg/250 mL  Status:  Discontinued     1,250 mg 166.7 mL/hr over 90 Minutes Intravenous Every 48 hours 11/07/19 1100 11/09/19 1318   11/08/19 1000  remdesivir 100 mg in sodium chloride 0.9 % 100 mL IVPB     100 mg 200 mL/hr over 30 Minutes Intravenous Daily 11/07/19 1217 11/11/19 1054   11/07/19 1600  piperacillin-tazobactam (ZOSYN) IVPB 3.375 g  Status:  Discontinued     3.375 g 12.5 mL/hr over 240 Minutes Intravenous Every 8 hours 11/07/19 1033 11/11/19 0729   11/07/19 1245  remdesivir 200 mg in sodium chloride 0.9% 250 mL IVPB     200 mg 580 mL/hr over 30 Minutes Intravenous Once 11/07/19 1216 11/07/19 1728   11/07/19 1130  vancomycin (VANCOREADY) IVPB 2000 mg/400 mL     2,000 mg 200 mL/hr over 120 Minutes Intravenous  Once 11/07/19 1053 11/07/19 1432   11/07/19 1100  piperacillin-tazobactam (ZOSYN) IVPB 3.375 g     3.375 g 100 mL/hr over 30 Minutes Intravenous  Once  11/07/19 1033 11/07/19 1303          Objective:   Vitals:   11/14/19 0400 11/14/19 0409 11/14/19 0749 11/14/19 0921  BP:   (!) 156/78 (!) 160/77  Pulse:   67   Resp:   (!) 21   Temp: (!) 94 F (34.4 C) (!) 94 F (34.4 C) (!) 95.5 F (35.3 C)   TempSrc: Axillary Rectal Rectal   SpO2:   96%   Weight:      Height:        SpO2: 96 % O2 Flow Rate (L/min): 1 L/min  Wt Readings from Last 3 Encounters:  11/08/19 104.9 kg  10/18/19 100.3 kg  10/17/19 94.9 kg     Intake/Output Summary (Last 24 hours) at 11/14/2019 1133 Last data filed at 11/14/2019 0900 Gross per 24 hour  Intake 240 ml  Output 801 ml  Net -561 ml     Physical Exam  Awake Alert, Oriented X 3, No new F.N deficits, Normal affect, poor dentition. Symmetrical Chest wall movement, Good air movement bilaterally, CTAB RRR,No Gallops,Rubs or new Murmurs, No Parasternal Heave +ve B.Sounds, Abd Soft, No tenderness, No rebound - guarding or rigidity. No Cyanosis, Clubbing,+2  edema, No new Rash or bruise  Data Review:    Recent Labs  Lab 11/10/19 0410 11/11/19 0338 11/12/19 0519 11/13/19 0547 11/14/19 0259  WBC 6.4 7.3 11.1* 8.0 5.0  HGB 9.1* 9.4* 10.2* 9.5* 8.9*  HCT 30.5* 31.2* 33.6* 31.4* 29.5*  PLT 164 145* 147* 129* 101*  MCV 96.2 97.8 96.6 96.0 95.8  MCH 28.7 29.5 29.3 29.1 28.9  MCHC 29.8* 30.1 30.4 30.3 30.2  RDW 18.4* 18.3* 18.3* 18.1* 18.2*  LYMPHSABS 0.3* 0.3* 0.3* 0.4* 0.4*  MONOABS 0.3 0.2 0.4 0.4 0.2  EOSABS 0.0 0.0 0.0 0.0 0.0  BASOSABS 0.0 0.0 0.0 0.0 0.0    Recent Labs  Lab 11/08/19 0437 11/08/19 0437 11/08/19 0500 11/09/19 0430 11/09/19 0430 11/10/19 0410 11/11/19 0338 11/12/19 0519 11/13/19 0547 11/13/19 1730 11/14/19 0259  NA 147*   < >  --  148*   < > 145 148* 146* 146*  --  147*  K 3.9   < >  --  3.6   < > 3.4* 3.5 3.1* 3.0*  --  3.7  CL 111   < >  --  111   < > 108 110 108 110  --  113*  CO2 25   < >  --  25   < > 26 25 25 25   --  25  GLUCOSE 188*   < >   --  134*   < > 169* 171* 130* 126*  --  138*  BUN 31*   < >  --  37*   < > 41* 43* 42* 44*  --  48*  CREATININE 1.88*   < >  --  1.93*   < > 1.85* 1.97* 1.79* 1.77*  --  1.67*  CALCIUM 8.4*   < >  --  8.8*   < > 8.5* 8.2* 8.3* 8.3*  --  8.0*  AST 14*   < >  --  15   < > 17 17 16  13*  --  12*  ALT 7   < >  --  5   < > 6 5 6 6   --  6  ALKPHOS 105   < >  --  100   < > 91 90 89 98  --  95  BILITOT 0.8   < >  --  0.7   < > 0.7 0.7 1.0 0.8  --  0.8  ALBUMIN 2.4*   < >  --  2.4*   < > 2.4* 2.4* 2.5* 2.2*  --  2.2*  MG 1.9   < >  --  1.8   < > 1.9 1.9 1.7 1.8  --  1.8  CRP 11.2*   < >  --  9.9*   < > 7.1* 5.5* 4.4* 9.1*  --  8.8*  DDIMER 1.34*   < >  --  2.00*   < > 2.51* 2.47* 2.95* 2.39*  --  1.85*  PROCALCITON <0.10  --   --  <0.10  --   --   --   --   --  0.25  --   INR 1.2  --   --   --   --   --   --   --   --   --   --   TSH  --   --   --   --   --   --   --   --   --  4.135  --   BNP  --   --  573.1* 1,043.9*  --   --   --   --   --   --   --    < > = values in this interval not displayed.    Recent Labs  Lab 11/08/19 0437 11/08/19 0437 11/08/19 0500 11/09/19 0430 11/09/19 0430 11/10/19 0410 11/11/19 0338 11/12/19 0519 11/13/19 0547 11/13/19 1730 11/14/19 0259  CRP 11.2*   < >  --  9.9*   < > 7.1* 5.5* 4.4* 9.1*  --  8.8*  DDIMER 1.34*   < >  --  2.00*   < > 2.51* 2.47* 2.95* 2.39*  --  1.85*  BNP  --   --  573.1* 1,043.9*  --   --   --   --   --   --   --   PROCALCITON <0.10  --   --  <0.10  --   --   --   --   --  0.25  --    < > = values in this interval not displayed.    ------------------------------------------------------------------------------------------------------------------ No results for input(s): CHOL, HDL, LDLCALC, TRIG, CHOLHDL, LDLDIRECT in the last 72 hours.  Lab Results  Component Value Date   HGBA1C 5.7 (H) 09/15/2019   ------------------------------------------------------------------------------------------------------------------ Recent Labs     11/13/19 1730  TSH 4.135   ------------------------------------------------------------------------------------------------------------------ No results for input(s): VITAMINB12, FOLATE, FERRITIN, TIBC, IRON, RETICCTPCT in the last 72 hours.  Coagulation profile Recent Labs  Lab 11/08/19 0437  INR 1.2    Recent Labs    11/13/19 0547 11/14/19 0259  DDIMER 2.39* 1.85*    Cardiac Enzymes No results for input(s): CKMB, TROPONINI, MYOGLOBIN in the last 168 hours.  Invalid input(s): CK ------------------------------------------------------------------------------------------------------------------    Component Value Date/Time   BNP 1,043.9 (H) 11/09/2019 0430    Micro Results Recent Results (from the past 240 hour(s))  Respiratory Panel by RT PCR (Flu A&B, Covid) - Nasopharyngeal Swab     Status: Abnormal   Collection Time: 11/06/19 10:54 AM   Specimen: Nasopharyngeal Swab  Result Value Ref Range Status   SARS Coronavirus 2 by RT PCR POSITIVE (A) NEGATIVE Final    Comment: RESULT CALLED TO, READ BACK BY AND VERIFIED WITH: Marzella Schlein RN 12:35 11/06/19 ([email or web address]) (NOTE) SARS-CoV-2 target nucleic acids are DETECTED. SARS-CoV-2 RNA is generally detectable in upper respiratory specimens  during the acute phase of infection. Positive results are indicative of the presence of the identified virus, but do not rule out bacterial infection or co-infection with other pathogens not detected by the test. Clinical correlation with patient history and other diagnostic information is necessary to determine patient infection status. The expected result is Negative. Fact Sheet for Patients:  PinkCheek.be Fact Sheet for Healthcare Providers: GravelBags.it This test is not yet approved or cleared by the [place] FDA and  has been authorized for detection and/or diagnosis of SARS-CoV-2 by FDA under an Emergency Use Authorization  (EUA).  This EUA will remain in effect (meaning this test can be used)  for the duration of  the COVID-19 declaration under Section 564(b)(1) of the Act, 21 U.S.C. section 360bbb-3(b)(1), unless the authorization is terminated or revoked sooner.    Influenza A by PCR NEGATIVE NEGATIVE Final   Influenza B by PCR NEGATIVE NEGATIVE Final    Comment: (NOTE) The Xpert Xpress SARS-CoV-2/FLU/RSV assay is intended as an aid in  the diagnosis of influenza from Nasopharyngeal swab specimens and  should not be used as a sole basis for treatment. Nasal  washings and  aspirates are unacceptable for Xpert Xpress SARS-CoV-2/FLU/RSV  testing. Fact Sheet for Patients: PinkCheek.be Fact Sheet for Healthcare Providers: GravelBags.it This test is not yet approved or cleared by the [place] FDA and  has been authorized for detection and/or diagnosis of SARS-CoV-2 by  FDA under an Emergency Use Authorization (EUA). This EUA will remain  in effect (meaning this test can be used) for the duration of the  Covid-19 declaration under Section 564(b)(1) of the Act, 21  U.S.C. section 360bbb-3(b)(1), unless the authorization is  terminated or revoked. Performed at [facility], [place] [address] [place], [place] [address]   Culture, blood (Routine X 2) w Reflex to ID Panel     Status: None   Collection Time: 11/06/19  9:40 PM   Specimen: BLOOD  Result Value Ref Range Status   Specimen Description BLOOD RIGHT ANTECUBITAL  Final   Special Requests   Final    BOTTLES DRAWN AEROBIC AND ANAEROBIC Blood Culture adequate volume   Culture   Final    NO GROWTH 5 DAYS Performed at [facility], [place] [address], [place], [place] [address]    Report Status 11/11/2019 FINAL  Final  Culture, blood (Routine X 2) w Reflex to ID Panel     Status: None   Collection Time: 11/06/19  9:49 PM   Specimen: BLOOD  Result Value Ref Range Status   Specimen  Description BLOOD LEFT ANTECUBITAL  Final   Special Requests   Final    BOTTLES DRAWN AEROBIC ONLY Blood Culture adequate volume   Culture   Final    NO GROWTH 5 DAYS Performed at [facility], [place] [address] [place], [place] [address]    Report Status 11/11/2019 FINAL  Final  Culture, blood (routine x 2)     Status: None (Preliminary result)   Collection Time: 11/13/19  6:09 PM   Specimen: BLOOD  Result Value Ref Range Status   Specimen Description BLOOD LEFT ANTECUBITAL  Final   Special Requests   Final    BOTTLES DRAWN AEROBIC AND ANAEROBIC Blood Culture adequate volume   Culture   Final    NO GROWTH < 12 HOURS Performed at [facility], [place] [address], [place], [place] [address]    Report Status PENDING  Incomplete  Culture, blood (routine x 2)     Status: None (Preliminary result)   Collection Time: 11/13/19  6:16 PM   Specimen: BLOOD LEFT HAND  Result Value Ref Range Status   Specimen Description BLOOD LEFT HAND  Final   Special Requests   Final    BOTTLES DRAWN AEROBIC AND ANAEROBIC Blood Culture adequate volume   Culture   Final    NO GROWTH < 12 HOURS Performed at [facility], [place] [address] [place], [place] [address]    Report Status PENDING  Incomplete    Radiology Reports CT ABDOMEN PELVIS WO CONTRAST  Result Date: 11/07/2019 CLINICAL DATA:  Generalized weakness and lethargy. Sepsis. Ascites. Cough. COVID positive yesterday. EXAM: CT CHEST, ABDOMEN AND PELVIS WITHOUT CONTRAST TECHNIQUE: Multidetector CT imaging of the chest, abdomen and pelvis was performed following the standard protocol without IV contrast. COMPARISON:  Radiograph yesterday. Abdominopelvic CT 09/15/2019 FINDINGS: CT CHEST FINDINGS Cardiovascular: Right upper extremity PICC with tip in the SVC. Motion artifact partially obscures detailed evaluation. Normal caliber thoracic aorta. Mild atherosclerosis. Mild cardiomegaly. No pericardial effusion. Mediastinum/Nodes: Motion and lack  of contrast limits assessment for  adenopathy. Small mediastinal lymph nodes not definitively enlarged. Assessment for hilar adenopathy is significantly limited. Esophagus is patulous. No suspicious thyroid nodule. Lungs/Pleura: Moderate right and small left pleural effusion. Adjacent compressive atelectasis. There are multifocal geographic patchy ground-glass opacities throughout both lungs. Trachea and mainstem bronchi are grossly patent, partially motion obscured. Musculoskeletal: Generalized subcutaneous edema throughout the chest wall. Diffuse degenerative change throughout the thoracic spine. There are no acute or suspicious osseous abnormalities. CT ABDOMEN PELVIS FINDINGS Hepatobiliary: No evidence of focal hepatic abnormality on noncontrast exam. Liver is prominent size spanning 20 cm cranial caudal. Question of nodular hepatic contours. Hyperdense gallbladder contents. No calcified gallstone. Common bile duct is not well-defined. Pancreas: No pancreatic ductal dilatation. No disproportionate peripancreatic edema. Spleen: Normal in size without focal abnormality. Adrenals/Urinary Tract: Adrenal thickening without dominant nodule. Chronic left renal atrophy. Left ureter is nondilated, unchanged 7 mm calcification in the left pelvis equivocally within the ureter. Right kidney appears normal in size. No hydronephrosis or renal calculi. Urinary bladder is distended and unremarkable. Stomach/Bowel: Bowel evaluation is limited in the absence of contrast, presence of ascites, and motion artifact. Nondistended stomach, grossly unremarkable. No small bowel obstruction. Appendix not visualized. Moderate volume of stool throughout the colon. There is mild colonic redundancy. No obvious colonic wall thickening or pericolonic inflammation. Vascular/Lymphatic: Abdominal aorta is normal in caliber. No bulky abdominopelvic adenopathy. Reproductive: Uterus is slightly bulky. Limited assessment for adnexal mass given pelvic  ascites. Other: Small to moderate volume abdominopelvic ascites. There is generalized edema of the intra-abdominal fat. Prominent generalized edema of the subcutaneous fat, confluent in the flanks. No free intra-abdominal air. Musculoskeletal: Degenerative change in the lumbar spine. There are no acute or suspicious osseous abnormalities. IMPRESSION: 1. Multifocal geographic patchy ground-glass opacities throughout both lungs, consistent with COVID-19 pneumonia. 2. Fluid overload. Moderate right and small left pleural effusion. Small to moderate volume abdominopelvic ascites. Generalized subcutaneous edema of the chest and abdomen consistent with third-spacing, confluent edema in the flanks. 3. Question of nodular hepatic contours, recommend correlation for cirrhosis. 4. Chronic left renal atrophy. Unchanged 7 mm calcification in the left pelvis equivocally within the ureter. No hydronephrosis. Aortic Atherosclerosis (ICD10-I70.0). Electronically Signed   By: Keith Rake M.D.   On: 11/07/2019 23:00   CT CHEST WO CONTRAST  Result Date: 11/07/2019 CLINICAL DATA:  Generalized weakness and lethargy. Sepsis. Ascites. Cough. COVID positive yesterday. EXAM: CT CHEST, ABDOMEN AND PELVIS WITHOUT CONTRAST TECHNIQUE: Multidetector CT imaging of the chest, abdomen and pelvis was performed following the standard protocol without IV contrast. COMPARISON:  Radiograph yesterday. Abdominopelvic CT 09/15/2019 FINDINGS: CT CHEST FINDINGS Cardiovascular: Right upper extremity PICC with tip in the SVC. Motion artifact partially obscures detailed evaluation. Normal caliber thoracic aorta. Mild atherosclerosis. Mild cardiomegaly. No pericardial effusion. Mediastinum/Nodes: Motion and lack of contrast limits assessment for adenopathy. Small mediastinal lymph nodes not definitively enlarged. Assessment for hilar adenopathy is significantly limited. Esophagus is patulous. No suspicious thyroid nodule. Lungs/Pleura: Moderate right  and small left pleural effusion. Adjacent compressive atelectasis. There are multifocal geographic patchy ground-glass opacities throughout both lungs. Trachea and mainstem bronchi are grossly patent, partially motion obscured. Musculoskeletal: Generalized subcutaneous edema throughout the chest wall. Diffuse degenerative change throughout the thoracic spine. There are no acute or suspicious osseous abnormalities. CT ABDOMEN PELVIS FINDINGS Hepatobiliary: No evidence of focal hepatic abnormality on noncontrast exam. Liver is prominent size spanning 20 cm cranial caudal. Question of nodular hepatic contours. Hyperdense gallbladder contents. No calcified gallstone. Common bile duct is not well-defined. Pancreas:  No pancreatic ductal dilatation. No disproportionate peripancreatic edema. Spleen: Normal in size without focal abnormality. Adrenals/Urinary Tract: Adrenal thickening without dominant nodule. Chronic left renal atrophy. Left ureter is nondilated, unchanged 7 mm calcification in the left pelvis equivocally within the ureter. Right kidney appears normal in size. No hydronephrosis or renal calculi. Urinary bladder is distended and unremarkable. Stomach/Bowel: Bowel evaluation is limited in the absence of contrast, presence of ascites, and motion artifact. Nondistended stomach, grossly unremarkable. No small bowel obstruction. Appendix not visualized. Moderate volume of stool throughout the colon. There is mild colonic redundancy. No obvious colonic wall thickening or pericolonic inflammation. Vascular/Lymphatic: Abdominal aorta is normal in caliber. No bulky abdominopelvic adenopathy. Reproductive: Uterus is slightly bulky. Limited assessment for adnexal mass given pelvic ascites. Other: Small to moderate volume abdominopelvic ascites. There is generalized edema of the intra-abdominal fat. Prominent generalized edema of the subcutaneous fat, confluent in the flanks. No free intra-abdominal air. Musculoskeletal:  Degenerative change in the lumbar spine. There are no acute or suspicious osseous abnormalities. IMPRESSION: 1. Multifocal geographic patchy ground-glass opacities throughout both lungs, consistent with COVID-19 pneumonia. 2. Fluid overload. Moderate right and small left pleural effusion. Small to moderate volume abdominopelvic ascites. Generalized subcutaneous edema of the chest and abdomen consistent with third-spacing, confluent edema in the flanks. 3. Question of nodular hepatic contours, recommend correlation for cirrhosis. 4. Chronic left renal atrophy. Unchanged 7 mm calcification in the left pelvis equivocally within the ureter. No hydronephrosis. Aortic Atherosclerosis (ICD10-I70.0). Electronically Signed   By: Keith Rake M.D.   On: 11/07/2019 23:00   DG Chest Port 1 View  Result Date: 11/09/2019 CLINICAL DATA:  Weakness, COVID-19 positive EXAM: PORTABLE CHEST 1 VIEW COMPARISON:  Chest radiograph from one day prior. FINDINGS: Right PICC terminates in the lower third of the SVC. Stable cardiomediastinal silhouette with mild cardiomegaly. No pneumothorax. No pleural effusion. Extensive patchy opacities throughout both lungs, slightly worsened in the upper lungs. IMPRESSION: Extensive patchy opacities throughout both lungs, slightly worsened in the upper lungs, compatible with COVID-19 pneumonia, although with component of pulmonary edema not excluded. Mild cardiomegaly. Electronically Signed   By: Ilona Sorrel M.D.   On: 11/09/2019 08:19   DG Chest Port 1 View  Result Date: 11/08/2019 CLINICAL DATA:  Shortness of breath, COVID-19, diabetes mellitus, hypertension, stage III chronic kidney disease, history endometrial cancer EXAM: PORTABLE CHEST 1 VIEW COMPARISON:  Portable exam 0816 hours compared to 11/06/2019 FINDINGS: RIGHT arm PICC line tip projects over SVC. Enlargement of cardiac silhouette with stable mediastinal contours. Extensive BILATERAL pulmonary infiltrates greatest perihilar on  RIGHT, question asymmetric pulmonary edema versus multifocal infection. No pleural effusion or pneumothorax. Bones demineralized with scattered degenerative changes of the thoracic spine. IMPRESSION: Enlargement of cardiac silhouette with increased BILATERAL pulmonary infiltrates greatest RIGHT perihilar question asymmetric pulmonary edema versus multifocal pneumonia. Electronically Signed   By: Lavonia Dana M.D.   On: 11/08/2019 08:51   DG CHEST PORT 1 VIEW  Result Date: 11/06/2019 CLINICAL DATA:  Weakness. EXAM: PORTABLE CHEST 1 VIEW COMPARISON:  November 05, 2019. FINDINGS: Stable cardiomegaly with possible central pulmonary vascular congestion. No pneumothorax is noted. Mild bibasilar atelectasis is noted with small pleural effusions. Bony thorax is unremarkable. IMPRESSION: Stable cardiomegaly with possible central pulmonary vascular congestion. Mild bibasilar subsegmental atelectasis is noted with small pleural effusions. Electronically Signed   By: Marijo Conception M.D.   On: 11/06/2019 15:57   DG Chest Port 1 View  Result Date: 11/05/2019 CLINICAL DATA:  Weakness. Found on  floor today. EXAM: PORTABLE CHEST 1 VIEW COMPARISON:  10/18/2019 FINDINGS: Stable heart size and mediastinal contours with mild cardiomegaly. Hazy opacity at both lung bases likely combination of pleural fluid and airspace disease/atelectasis. Vascular congestion. No pneumothorax. Left proximal humerus fracture appears subacute with surrounding heterotopic ossification, also seen on 09/17/2019 shoulder radiograph. IMPRESSION: 1. Hazy opacity at both lung bases likely combination of pleural fluid and airspace disease/atelectasis. This is stable on the right but progressive on the left from prior. 2. Stable mild cardiomegaly.  Vascular congestion. Electronically Signed   By: Keith Rake M.D.   On: 11/05/2019 17:02   DG Chest Portable 1 View  Result Date: 10/18/2019 CLINICAL DATA:  Hypertension and shortness of breath EXAM:  PORTABLE CHEST 1 VIEW COMPARISON:  September 17, 2019 FINDINGS: Ill-defined opacity in the right mid and lower lung regions noted with suspected small superimposed pleural effusion. There is mild left base atelectasis. Left lung otherwise clear. Heart is mildly enlarged with pulmonary vascularity normal. No adenopathy. There is degenerative change in the thoracic spine. IMPRESSION: Ill-defined opacity right mid and lower lung zones, likely due to a degree of pneumonia with superimposed small pleural effusion. Mild left base atelectasis. Stable cardiac prominence. No adenopathy evident. Electronically Signed   By: Lowella Grip III M.D.   On: 10/18/2019 14:06   VAS [place] LOWER EXTREMITY VENOUS (DVT)  Result Date: 11/07/2019  Lower Venous DVTStudy Indications: Swelling, and Edema.  Limitations: Body habitus and poor ultrasound/tissue interface. Comparison Study: 09/16/19 previous Performing Technologist: Abram Sander RVS  Examination Guidelines: A complete evaluation includes B-mode imaging, spectral Doppler, color Doppler, and power Doppler as needed of all accessible portions of each vessel. Bilateral testing is considered an integral part of a complete examination. Limited examinations for reoccurring indications may be performed as noted. The reflux portion of the exam is performed with the patient in reverse Trendelenburg.  +---------+---------------+---------+-----------+----------+--------------+ RIGHT    CompressibilityPhasicitySpontaneityPropertiesThrombus Aging +---------+---------------+---------+-----------+----------+--------------+ CFV      Full           Yes      Yes                                 +---------+---------------+---------+-----------+----------+--------------+ SFJ      Full                                                        +---------+---------------+---------+-----------+----------+--------------+ FV Prox  Full                                                         +---------+---------------+---------+-----------+----------+--------------+ FV Mid                                                Not visualized +---------+---------------+---------+-----------+----------+--------------+ FV Distal               Yes      Yes                                 +---------+---------------+---------+-----------+----------+--------------+  PFV      Full                                                        +---------+---------------+---------+-----------+----------+--------------+ POP      Full           Yes      Yes                                 +---------+---------------+---------+-----------+----------+--------------+ PTV                                                   Not visualized +---------+---------------+---------+-----------+----------+--------------+ PERO                                                  Not visualized +---------+---------------+---------+-----------+----------+--------------+   +---------+---------------+---------+-----------+----------+--------------+ LEFT     CompressibilityPhasicitySpontaneityPropertiesThrombus Aging +---------+---------------+---------+-----------+----------+--------------+ CFV      Full           Yes      Yes                                 +---------+---------------+---------+-----------+----------+--------------+ SFJ      Full                                                        +---------+---------------+---------+-----------+----------+--------------+ FV Prox  Full                                                        +---------+---------------+---------+-----------+----------+--------------+ FV Mid                  Yes      Yes                                 +---------+---------------+---------+-----------+----------+--------------+ FV Distal               Yes      Yes                                  +---------+---------------+---------+-----------+----------+--------------+ PFV      Full                                                        +---------+---------------+---------+-----------+----------+--------------+ POP  Full           Yes      Yes                                 +---------+---------------+---------+-----------+----------+--------------+ PTV      Full                                                        +---------+---------------+---------+-----------+----------+--------------+ PERO                                                  Not visualized +---------+---------------+---------+-----------+----------+--------------+     Summary: BILATERAL: - No evidence of deep vein thrombosis seen in the lower extremities, bilaterally.   *See table(s) above for measurements and observations. Electronically signed by Curt Jews MD on 11/07/2019 at 4:26:57 PM.    Final    [place] EKG SITE RITE  Result Date: 11/07/2019 If Site Rite image not attached, placement could not be confirmed due to current cardiac rhythm.    Phillips Climes M.D on 11/14/2019 at 11:33 AM  To page go to [email or web address] - password [facility]

## 2019-11-14 NOTE — Progress Notes (Signed)
Occupational Therapy Treatment Patient Details Name: Vanessa [name] MRN: [id number] DOB: [birth year] Today's Date: 11/14/2019    History of present illness 64 year old female admitted 11/05/19 with inability to care for herself at home. Patient was recently discharged from SNF and has been unable to care for herself since. She was found to be COVID+. Hypothermia of unclear etiology. PMH includes HTN, DM, CKD, and endometrial cancer.    OT comments  Mobilized OOB to recliner with use of Stedy with mo da +2 form lower level. Pt able to stand in [place] for several minutes with mingurad A with VSS on 1L. Multiple sit - stands, including sit - stand from lower level recliner with mod A. Completed seated grooming task with set up. Pt issued activities to complete while OOB to encourage time OOB in chair, and facilitate participation in meaningful tasks while improving activity tolerance. Pt very appreciative and demonstrates much brighter affect after session. Pt will benefit from rehab at SNF. Encourage OOB daily with nsg staff with use of Stedy. Will continue to follow acutely.   Follow Up Recommendations  SNF;Supervision/Assistance - 24 hour    Equipment Recommendations  Other (comment)(TBA)    Recommendations for Other Services      Precautions / Restrictions Precautions Precautions: Fall Precaution Comments: RUE PICC Restrictions Weight Bearing Restrictions: Yes LUE Weight Bearing: Weight bearing as tolerated       Mobility Bed Mobility Overal bed mobility: Needs Assistance Bed Mobility: Supine to Sit     Supine to sit: +2 for physical assistance;Max assist     General bed mobility comments: used of bed pad; physical A advance legs adn push upright into sitting  Transfers Overall transfer level: Needs assistance     Sit to Stand: Mod assist;+2 physical assistance         General transfer comment: Minguard A for paddles on stedy    Balance     Sitting balance-Leahy  Scale: Fair       Standing balance-Leahy Scale: Poor                             ADL either performed or assessed with clinical judgement   ADL Overall ADL's : Needs assistance/impaired Eating/Feeding: Set up;Sitting   Grooming: Set up;Sitting   Upper Body Bathing: Minimal assistance;Sitting   Lower Body Bathing: Maximal assistance;Bed level   Upper Body Dressing : Sitting;Minimal assistance           Toileting- Clothing Manipulation and Hygiene: Total assistance Toileting - Clothing Manipulation Details (indicate cue type and reason): incontinenet of BM; using Purewick and very conscious of purewick     Functional mobility during ADLs: Moderate assistance;+2 for physical assistance(use of Stedy)       Vision       Perception     Praxis      Cognition Arousal/Alertness: Awake/alert Behavior During Therapy: WFL for tasks assessed/performed Overall Cognitive Status: No family/caregiver present to determine baseline cognitive functioning                                 General Comments: slow processing        Exercises Exercises: Other exercises Other Exercises Other Exercises: level 1 theraband issued for BUE strengthening as tolerated   Shoulder Instructions       General Comments SpO2 in 90s on 1L during session; Pt enjoys puzzles  and coloring - activities provided after session  - pt very appreciative    Pertinent Vitals/ Pain       Pain Assessment: Faces Faces Pain Scale: Hurts little more Pain Location: L LE Pain Descriptors / Indicators: Grimacing;Guarding Pain Intervention(s): Limited activity within patient's tolerance  Home Living                                          Prior Functioning/Environment              Frequency  Min 2X/week        Progress Toward Goals  OT Goals(current goals can now be found in the care plan section)  Progress towards OT goals: Progressing toward  goals  Acute Rehab OT Goals Patient Stated Goal: get stronger after rehab OT Goal Formulation: With patient Time For Goal Achievement: 11/21/19 Potential to Achieve Goals: Good ADL Goals Pt Will Perform Upper Body Bathing: with supervision;sitting Pt Will Perform Lower Body Bathing: with mod assist;sitting/lateral leans;sit to/from stand Pt Will Perform Upper Body Dressing: with supervision;sitting Pt Will Perform Lower Body Dressing: with mod assist;sitting/lateral leans;sit to/from stand Pt Will Transfer to Toilet: with mod assist;stand pivot transfer;with +2 assist;bedside commode Pt Will Perform Toileting - Clothing Manipulation and hygiene: with mod assist;sit to/from stand;sitting/lateral leans  Plan Discharge plan remains appropriate    Co-evaluation    PT/OT/SLP Co-Evaluation/Treatment: Yes Reason for Co-Treatment: Complexity of the patient's impairments (multi-system involvement);For patient/therapist safety;To address functional/ADL transfers   OT goals addressed during session: ADL's and self-care;Strengthening/ROM      AM-PAC OT "6 Clicks" Daily Activity     Outcome Measure   Help from another person eating meals?: A Little Help from another person taking care of personal grooming?: A Little Help from another person toileting, which includes using toliet, bedpan, or urinal?: A Lot Help from another person bathing (including washing, rinsing, drying)?: A Lot Help from another person to put on and taking off regular upper body clothing?: A Little Help from another person to put on and taking off regular lower body clothing?: A Lot 6 Click Score: 15    End of Session Equipment Utilized During Treatment: Gait belt;Oxygen(1L)  OT Visit Diagnosis: Unsteadiness on feet (R26.81);Other abnormalities of gait and mobility (R26.89);History of falling (Z91.81);Muscle weakness (generalized) (M62.81);Pain Pain - part of body: (general discomfort)   Activity Tolerance Patient  tolerated treatment well   Patient Left in chair;with call bell/phone within reach;with chair alarm set   Nurse Communication Mobility status;Need for lift equipment(Stedy)        Time: 2902-1115 OT Time Calculation (min): 58 min  Charges: OT General Charges $OT Visit: 1 Visit OT Treatments $Self Care/Home Management : 23-37 mins  Maurie Boettcher, OT/L   Acute OT Clinical Specialist [place] Pager [phone] Office [phone]    [facility] 11/14/2019, 2:08 PM

## 2019-11-15 DIAGNOSIS — T68XXXS Hypothermia, sequela: Secondary | ICD-10-CM

## 2019-11-15 LAB — GLUCOSE, CAPILLARY
Glucose-Capillary: 128 mg/dL — ABNORMAL HIGH (ref 70–99)
Glucose-Capillary: 91 mg/dL (ref 70–99)
Glucose-Capillary: 134 mg/dL — ABNORMAL HIGH (ref 70–99)
Glucose-Capillary: 123 mg/dL — ABNORMAL HIGH (ref 70–99)

## 2019-11-15 LAB — COMPREHENSIVE METABOLIC PANEL
ALT: 6 U/L (ref 0–44)
AST: 11 U/L — ABNORMAL LOW (ref 15–41)
Albumin: 2.2 g/dL — ABNORMAL LOW (ref 3.5–5.0)
Alkaline Phosphatase: 93 U/L (ref 38–126)
Anion gap: 7 (ref 5–15)
BUN: 47 mg/dL — ABNORMAL HIGH (ref 8–23)
CO2: 28 mmol/L (ref 22–32)
Calcium: 8.1 mg/dL — ABNORMAL LOW (ref 8.9–10.3)
Chloride: 115 mmol/L — ABNORMAL HIGH (ref 98–111)
Creatinine, Ser: 1.68 mg/dL — ABNORMAL HIGH (ref 0.44–1.00)
GFR calc Af Amer: 37 mL/min — ABNORMAL LOW (ref >60)
GFR calc non Af Amer: 32 mL/min — ABNORMAL LOW (ref >60)
Glucose, Bld: 148 mg/dL — ABNORMAL HIGH (ref 70–99)
Potassium: 3.8 mmol/L (ref 3.5–5.1)
Sodium: 150 mmol/L — ABNORMAL HIGH (ref 135–145)
Total Bilirubin: 0.8 mg/dL (ref 0.3–1.2)
Total Protein: 5.8 g/dL — ABNORMAL LOW (ref 6.5–8.1)

## 2019-11-15 LAB — CBC
HCT: 29.5 % — ABNORMAL LOW (ref 36.0–46.0)
Hemoglobin: 8.7 g/dL — ABNORMAL LOW (ref 12.0–15.0)
MCH: 28.5 pg (ref 26.0–34.0)
MCHC: 29.5 g/dL — ABNORMAL LOW (ref 30.0–36.0)
MCV: 96.7 fL (ref 80.0–100.0)
Platelets: 120 10*3/uL — ABNORMAL LOW (ref 150–400)
RBC: 3.05 MIL/uL — ABNORMAL LOW (ref 3.87–5.11)
RDW: 18.6 % — ABNORMAL HIGH (ref 11.5–15.5)
WBC: 6 10*3/uL (ref 4.0–10.5)
nRBC: 0.3 % — ABNORMAL HIGH (ref 0.0–0.2)

## 2019-11-15 LAB — AMMONIA: Ammonia: 22 umol/L (ref 9–35)

## 2019-11-15 MED ORDER — CLONIDINE HCL 0.1 MG PO TABS
0.1000 mg | ORAL_TABLET | Freq: Two times a day (BID) | ORAL | Status: DC
  Administered 2019-11-15 (×2): 0.1 mg via ORAL
  Filled 2019-11-15 (×2): qty 1

## 2019-11-15 MED ORDER — ALBUMIN HUMAN 25 % IV SOLN
12.5000 g | Freq: Four times a day (QID) | INTRAVENOUS | Status: AC
  Administered 2019-11-15 – 2019-11-16 (×4): 12.5 g via INTRAVENOUS
  Filled 2019-11-15 (×4): qty 50

## 2019-11-15 MED ORDER — ENSURE ENLIVE PO LIQD
237.0000 mL | Freq: Three times a day (TID) | ORAL | Status: DC
  Administered 2019-11-15 – 2019-11-22 (×17): 237 mL via ORAL
  Filled 2019-11-15: qty 237

## 2019-11-15 MED ORDER — METRONIDAZOLE 500 MG PO TABS
500.0000 mg | ORAL_TABLET | Freq: Three times a day (TID) | ORAL | Status: DC
  Administered 2019-11-15 – 2019-11-22 (×21): 500 mg via ORAL
  Filled 2019-11-15 (×21): qty 1

## 2019-11-15 NOTE — Plan of Care (Addendum)
Unable to get oral or axillary temperature. Rectal temperature showing 93.3 and bear hugger applied. Patient alert and oriented, denies any c/o. Will continue to monitor.  Temperature 94.1 orally, bear hugger continues. No change in patient status. No change in assessment. Patient remains alert and oriented x 4. Bear hugger in place.

## 2019-11-15 NOTE — Progress Notes (Signed)
PROGRESS NOTE                                                                                                                                                                                                             Patient Demographics:    Vanessa [name], is a 64 y.o. female, DOB - 1955/11/19, YYT:[id number]  Admit date - 11/05/2019   Admitting Physician Mckinley Jewel, MD  Outpatient Primary MD for the patient is Maren Reamer, MD (Inactive)  LOS - 9  CC - fever     Brief Narrative    -Vanessa [name] is a 64 y.o. female with medical history significant of hypertension, diabetes, hyperlipidemia, grade 2 diastolic congestive heart failure, CKD stage IIIb, endometrial cancer presents to emergency department due to generalized weakness and lethargy, she was diagnosed with COVID-19 pneumonia with sepsis and hypothermia and admitted to the hospital.    She was recently hospitalized for MSSA bacteremia few weeks ago.  She also carries a history of endometrial cancer under the care of Dr. Berline Lopes GYN oncology deemed a poor surgical candidate for now, also recent GI bleed due to duodenal ulcer.  She has completely recovered from COVID-19 pneumonia her only issue now is persistent hypothermia of unclear etiology, stable TSH and cortisol, appears completely nontoxic, blood cultures negative stable procalcitonin.  Continue providing supportive care.   Subjective:   Patient in bed, appears comfortable, denies any headache, reports some generalized weakness, she remains with intermittent hypothermia which she still requiring warming blanket .   Assessment  & Plan :     Sepsis due COVID 19 infection -  Etiology not entirely clear, she is hypothermic without leukocytosis, does have COVID-19 infection and mild pneumonia this admission , she recently had MSSA bacteremia few weeks ago. - At this point repeat blood cultures were ordered and stayed negative, stable procalcitonin,  cefazolin has been stopped, she has completed her remdesivir and steroid course for COVID-19 pneumonia, CT chest abdomen pelvis nonacute.  Continue supportive care. -Reports she received the first dose of Covid vaccine last March, second 1 remains due.  Hypothermia.   -So far no clear etiology despite extensive work-up, this of infectious process currently with stable procalcitonin, TSH, free T4 and cortisol level and negative blood cultures. - could be central in origin due to reset thermostat, TSH cortisol stable, appears nontoxic, negative blood cultures, stable procalcitonin, completely symptom-free, supportive care with bear hugger, increase room temperature and monitor. -Her medication regimen has been  adjusted recently, I have stopped her beta-blocker as case reports of its causing hypothermia, as well I have decreased her hydralazine to see if it helps with her hypothermia, given its vasodilatory effect. -He remains with hypothermia requiring intermittent warming blanket, will insert Foley catheter with thermometer to get accurate pressure readings.  Endometrial cancer with intermittent bleeding and spotting.   - Stable H&H, will type screen, outpatient follow-up with OB oncologist Dr. Berline Lopes once discharged. -Reviewing Dr. Berline Lopes notes, patient plan to be treated with IUD, but she needs to finish 7 days of Flagyl for trichomonas infection, will start her on Flagyl for that.  History of duodenal ulcer with GI bleed.   -She is on PPI, have increased to twice daily, especially as she is being started on DVT prophylaxis dose .  Borderline elevated D-dimer.   - Could be due to her endometrial bleed history, negative lower extremity ultrasound. Tough situation due to recent history of upper GI bleed with ongoing intermittent bleed from her vagina.  But she is not much ambulatory, with COVID-19, elevated D-dimers and malignancy which puts her at significant hypercoagulable state and risk, given  her GI bleed more than 4 weeks ago, and her hemoglobin has been stable, I did start her on subcu heparin for DVT prophylaxis, and increased her Protonix to twice daily.  Hypertension.   -Blood pressure overall is elevated, her regimen has been changed multiple times during recent admissions given its hard to control . -For now I will continue with Norvasc, doxazosin, I have stopped her Coreg and decrease hydralazine to see if this helps with hypothermia please see discussion below , blood pressure remains significantly elevated, so I have started on clonidine as well.  Intravascular dehydration with hypernatremia.  -Currently appears to be volume overloaded.   Leg edema mostly nonpitting.  -Started on IV diuresis, but it has been stopped given worsening hypernatremia -Initial imaging showing some evidence of cirrhosis, ammonia level, and give some IV albumin for albumin level of 2.2  MIcrocytic iron deficiency anemia.  On oral iron supplementation likely due to ongoing intermittent vaginal blood loss, outpatient follow-up with PCP and GYN oncology.  Hypernatremia and hypokalemia.   -She is a significant hypokalemia today with potassium was 3, will replete  DM type II.  ISS.  Follow closely blood sugars as she is now on D5W and Decadron.  May require Lantus and adjustment of short-acting insulin.  Lab Results  Component Value Date   HGBA1C 5.7 (H) 09/15/2019   CBG (last 3)  Recent Labs    11/14/19 2101 11/15/19 0804 11/15/19 1221  GLUCAP 159* 128* 91      Condition.  Extremely guarded.   Family Communication  :  husband Latterell [phone] on 5/5  Code Status :  Full  Disposition Plan  :    Status is: Inpatient  Remains inpatient appropriate because:Hemodynamically unstable, remains with significant hypothermia   Dispo: The patient is from: SNF              Anticipated d/c is to: SNF              Anticipated d/c date is: > 3 days              Patient currently is  not medically stable to d/c.   Consults  :  None  Procedures  :    DVT Prophylaxis  :   Heparin     Lab Results  Component Value Date   PLT  120 (L) 11/15/2019    Diet :  Diet Order            Diet heart healthy/carb modified Room service appropriate? Yes; Fluid consistency: Thin  Diet effective now               Inpatient Medications Scheduled Meds: . albuterol  2 puff Inhalation Q4H  . amLODipine  10 mg Oral Daily  . vitamin C  500 mg Oral Daily  . Chlorhexidine Gluconate Cloth  6 each Topical Daily  . cloNIDine  0.1 mg Oral BID  . doxazosin  4 mg Oral Daily  . feeding supplement (ENSURE ENLIVE)  237 mL Oral TID BM  . feeding supplement (PRO-STAT SUGAR FREE 64)  30 mL Oral TID WC  . ferrous sulfate  325 mg Oral TID WC  . heparin injection (subcutaneous)  5,000 Units Subcutaneous Q8H  . hydrALAZINE  50 mg Oral Q8H  . hydrochlorothiazide  12.5 mg Oral Daily  . insulin aspart  0-15 Units Subcutaneous TID WC  . insulin aspart  0-5 Units Subcutaneous QHS  . mouth rinse  15 mL Mouth Rinse BID  . pantoprazole sodium  40 mg Oral BID  . polyethylene glycol  17 g Oral Daily  . zinc sulfate  220 mg Oral Daily   Continuous Infusions:  PRN Meds:.acetaminophen, capsaicin, guaiFENesin-dextromethorphan, loratadine, [DISCONTINUED] ondansetron **OR** ondansetron (ZOFRAN) IV, sodium chloride flush  Antibiotics  :   Anti-infectives (From admission, onward)   Start     Dose/Rate Route Frequency Ordered Stop   11/11/19 1000  doxycycline (VIBRA-TABS) tablet 100 mg  Status:  Discontinued     100 mg Oral Every 12 hours 11/11/19 0729 11/12/19 0921   11/09/19 1230  vancomycin (VANCOREADY) IVPB 1250 mg/250 mL  Status:  Discontinued     1,250 mg 166.7 mL/hr over 90 Minutes Intravenous Every 48 hours 11/07/19 1100 11/09/19 1318   11/08/19 1000  remdesivir 100 mg in sodium chloride 0.9 % 100 mL IVPB     100 mg 200 mL/hr over 30 Minutes Intravenous Daily 11/07/19 1217 11/11/19 1054    11/07/19 1600  piperacillin-tazobactam (ZOSYN) IVPB 3.375 g  Status:  Discontinued     3.375 g 12.5 mL/hr over 240 Minutes Intravenous Every 8 hours 11/07/19 1033 11/11/19 0729   11/07/19 1245  remdesivir 200 mg in sodium chloride 0.9% 250 mL IVPB     200 mg 580 mL/hr over 30 Minutes Intravenous Once 11/07/19 1216 11/07/19 1728   11/07/19 1130  vancomycin (VANCOREADY) IVPB 2000 mg/400 mL     2,000 mg 200 mL/hr over 120 Minutes Intravenous  Once 11/07/19 1053 11/07/19 1432   11/07/19 1100  piperacillin-tazobactam (ZOSYN) IVPB 3.375 g     3.375 g 100 mL/hr over 30 Minutes Intravenous  Once 11/07/19 1033 11/07/19 1303          Objective:   Vitals:   11/15/19 0007 11/15/19 0400 11/15/19 0500 11/15/19 0800  BP: (!) 166/78   (!) 167/82  Pulse: 68   65  Resp: 18   (!) 21  Temp: (!) 94.1 F (34.5 C) (!) 94.6 F (34.8 C)  (!) 94.5 F (34.7 C)  TempSrc: Oral   Oral  SpO2:    95%  Weight:   96.5 kg   Height:        SpO2: 95 % O2 Flow Rate (L/min): 1 L/min  Wt Readings from Last 3 Encounters:  11/15/19 96.5 kg  10/18/19 100.3 kg  10/17/19 94.9 kg  No intake or output data in the 24 hours ending 11/15/19 1512   Physical Exam  Awake Alert, Oriented X 3, but during the day she has been having fluctuating mental status, no new F.N deficits, poor dentition Symmetrical Chest wall movement, Good air movement bilaterally, CTAB RRR,No Gallops,Rubs or new Murmurs, No Parasternal Heave +ve B.Sounds, Abd Soft, No tenderness, No rebound - guarding or rigidity. No Cyanosis, Clubbing , +2 edema, No new Rash or bruise         Data Review:    Recent Labs  Lab 11/10/19 0410 11/10/19 0410 11/11/19 0338 11/12/19 0519 11/13/19 0547 11/14/19 0259 11/15/19 0206  WBC 6.4   < > 7.3 11.1* 8.0 5.0 6.0  HGB 9.1*   < > 9.4* 10.2* 9.5* 8.9* 8.7*  HCT 30.5*   < > 31.2* 33.6* 31.4* 29.5* 29.5*  PLT 164   < > 145* 147* 129* 101* 120*  MCV 96.2   < > 97.8 96.6 96.0 95.8 96.7  MCH 28.7    < > 29.5 29.3 29.1 28.9 28.5  MCHC 29.8*   < > 30.1 30.4 30.3 30.2 29.5*  RDW 18.4*   < > 18.3* 18.3* 18.1* 18.2* 18.6*  LYMPHSABS 0.3*  --  0.3* 0.3* 0.4* 0.4*  --   MONOABS 0.3  --  0.2 0.4 0.4 0.2  --   EOSABS 0.0  --  0.0 0.0 0.0 0.0  --   BASOSABS 0.0  --  0.0 0.0 0.0 0.0  --    < > = values in this interval not displayed.    Recent Labs  Lab 11/09/19 0430 11/09/19 0430 11/10/19 0410 11/10/19 0410 11/11/19 9242 11/12/19 0519 11/13/19 0547 11/13/19 1730 11/14/19 0259 11/15/19 0206  NA 148*   < > 145   < > 148* 146* 146*  --  147* 150*  K 3.6   < > 3.4*   < > 3.5 3.1* 3.0*  --  3.7 3.8  CL 111   < > 108   < > 110 108 110  --  113* 115*  CO2 25   < > 26   < > 25 25 25   --  25 28  GLUCOSE 134*   < > 169*   < > 171* 130* 126*  --  138* 148*  BUN 37*   < > 41*   < > 43* 42* 44*  --  48* 47*  CREATININE 1.93*   < > 1.85*   < > 1.97* 1.79* 1.77*  --  1.67* 1.68*  CALCIUM 8.8*   < > 8.5*   < > 8.2* 8.3* 8.3*  --  8.0* 8.1*  AST 15   < > 17   < > 17 16 13*  --  12* 11*  ALT 5   < > 6   < > 5 6 6   --  6 6  ALKPHOS 100   < > 91   < > 90 89 98  --  95 93  BILITOT 0.7   < > 0.7   < > 0.7 1.0 0.8  --  0.8 0.8  ALBUMIN 2.4*   < > 2.4*   < > 2.4* 2.5* 2.2*  --  2.2* 2.2*  MG 1.8   < > 1.9  --  1.9 1.7 1.8  --  1.8  --   CRP 9.9*   < > 7.1*  --  5.5* 4.4* 9.1*  --  8.8*  --  DDIMER 2.00*   < > 2.51*  --  2.47* 2.95* 2.39*  --  1.85*  --   PROCALCITON <0.10  --   --   --   --   --   --  0.25  --   --   TSH  --   --   --   --   --   --   --  4.135  --   --   BNP 1,043.9*  --   --   --   --   --   --   --   --   --    < > = values in this interval not displayed.    Recent Labs  Lab 11/09/19 0430 11/09/19 0430 11/10/19 0410 11/11/19 0338 11/12/19 0519 11/13/19 0547 11/13/19 1730 11/14/19 0259  CRP 9.9*   < > 7.1* 5.5* 4.4* 9.1*  --  8.8*  DDIMER 2.00*   < > 2.51* 2.47* 2.95* 2.39*  --  1.85*  BNP 1,043.9*  --   --   --   --   --   --   --   PROCALCITON <0.10  --   --   --    --   --  0.25  --    < > = values in this interval not displayed.    ------------------------------------------------------------------------------------------------------------------ No results for input(s): CHOL, HDL, LDLCALC, TRIG, CHOLHDL, LDLDIRECT in the last 72 hours.  Lab Results  Component Value Date   HGBA1C 5.7 (H) 09/15/2019   ------------------------------------------------------------------------------------------------------------------ Recent Labs    11/13/19 1730  TSH 4.135   ------------------------------------------------------------------------------------------------------------------ No results for input(s): VITAMINB12, FOLATE, FERRITIN, TIBC, IRON, RETICCTPCT in the last 72 hours.  Coagulation profile No results for input(s): INR, PROTIME in the last 168 hours.  Recent Labs    11/13/19 0547 11/14/19 0259  DDIMER 2.39* 1.85*    Cardiac Enzymes No results for input(s): CKMB, TROPONINI, MYOGLOBIN in the last 168 hours.  Invalid input(s): CK ------------------------------------------------------------------------------------------------------------------    Component Value Date/Time   BNP 1,043.9 (H) 11/09/2019 0430    Micro Results Recent Results (from the past 240 hour(s))  Respiratory Panel by RT PCR (Flu A&B, Covid) - Nasopharyngeal Swab     Status: Abnormal   Collection Time: 11/06/19 10:54 AM   Specimen: Nasopharyngeal Swab  Result Value Ref Range Status   SARS Coronavirus 2 by RT PCR POSITIVE (A) NEGATIVE Final    Comment: RESULT CALLED TO, READ BACK BY AND VERIFIED WITH: Marzella Schlein RN 12:35 11/06/19 ([email or web address]) (NOTE) SARS-CoV-2 target nucleic acids are DETECTED. SARS-CoV-2 RNA is generally detectable in upper respiratory specimens  during the acute phase of infection. Positive results are indicative of the presence of the identified virus, but do not rule out bacterial infection or co-infection with other pathogens not detected by the  test. Clinical correlation with patient history and other diagnostic information is necessary to determine patient infection status. The expected result is Negative. Fact Sheet for Patients:  PinkCheek.be Fact Sheet for Healthcare Providers: GravelBags.it This test is not yet approved or cleared by the [place] FDA and  has been authorized for detection and/or diagnosis of SARS-CoV-2 by FDA under an Emergency Use Authorization (EUA).  This EUA will remain in effect (meaning this test can be used)  for the duration of  the COVID-19 declaration under Section 564(b)(1) of the Act, 21 U.S.C. section 360bbb-3(b)(1), unless the authorization is terminated or revoked sooner.    Influenza A by PCR NEGATIVE  NEGATIVE Final   Influenza B by PCR NEGATIVE NEGATIVE Final    Comment: (NOTE) The Xpert Xpress SARS-CoV-2/FLU/RSV assay is intended as an aid in  the diagnosis of influenza from Nasopharyngeal swab specimens and  should not be used as a sole basis for treatment. Nasal washings and  aspirates are unacceptable for Xpert Xpress SARS-CoV-2/FLU/RSV  testing. Fact Sheet for Patients: PinkCheek.be Fact Sheet for Healthcare Providers: GravelBags.it This test is not yet approved or cleared by the [place] FDA and  has been authorized for detection and/or diagnosis of SARS-CoV-2 by  FDA under an Emergency Use Authorization (EUA). This EUA will remain  in effect (meaning this test can be used) for the duration of the  Covid-19 declaration under Section 564(b)(1) of the Act, 21  U.S.C. section 360bbb-3(b)(1), unless the authorization is  terminated or revoked. Performed at [facility], [place] [address] [place], [place] [address]   Culture, blood (Routine X 2) w Reflex to ID Panel     Status: None   Collection Time: 11/06/19  9:40 PM   Specimen: BLOOD  Result Value  Ref Range Status   Specimen Description BLOOD RIGHT ANTECUBITAL  Final   Special Requests   Final    BOTTLES DRAWN AEROBIC AND ANAEROBIC Blood Culture adequate volume   Culture   Final    NO GROWTH 5 DAYS Performed at [facility], [place] [address] [place], [place] [address]    Report Status 11/11/2019 FINAL  Final  Culture, blood (Routine X 2) w Reflex to ID Panel     Status: None   Collection Time: 11/06/19  9:49 PM   Specimen: BLOOD  Result Value Ref Range Status   Specimen Description BLOOD LEFT ANTECUBITAL  Final   Special Requests   Final    BOTTLES DRAWN AEROBIC ONLY Blood Culture adequate volume   Culture   Final    NO GROWTH 5 DAYS Performed at [facility], [place] [address] [place], [place] [address]    Report Status 11/11/2019 FINAL  Final  Culture, blood (routine x 2)     Status: None (Preliminary result)   Collection Time: 11/13/19  6:09 PM   Specimen: BLOOD  Result Value Ref Range Status   Specimen Description BLOOD LEFT ANTECUBITAL  Final   Special Requests   Final    BOTTLES DRAWN AEROBIC AND ANAEROBIC Blood Culture adequate volume   Culture   Final    NO GROWTH 2 DAYS Performed at [facility], [place] [address], [place], [place] [address]    Report Status PENDING  Incomplete  Culture, blood (routine x 2)     Status: None (Preliminary result)   Collection Time: 11/13/19  6:16 PM   Specimen: BLOOD LEFT HAND  Result Value Ref Range Status   Specimen Description BLOOD LEFT HAND  Final   Special Requests   Final    BOTTLES DRAWN AEROBIC AND ANAEROBIC Blood Culture adequate volume   Culture   Final    NO GROWTH 2 DAYS Performed at [facility], [place] [address] [place], [place] [address]    Report Status PENDING  Incomplete    Radiology Reports CT ABDOMEN PELVIS WO CONTRAST  Result Date: 11/07/2019 CLINICAL DATA:  Generalized weakness and lethargy. Sepsis. Ascites. Cough. COVID positive yesterday. EXAM: CT CHEST, ABDOMEN AND PELVIS  WITHOUT CONTRAST TECHNIQUE: Multidetector CT imaging of the chest, abdomen and pelvis was performed following the standard protocol without IV contrast.  COMPARISON:  Radiograph yesterday. Abdominopelvic CT 09/15/2019 FINDINGS: CT CHEST FINDINGS Cardiovascular: Right upper extremity PICC with tip in the SVC. Motion artifact partially obscures detailed evaluation. Normal caliber thoracic aorta. Mild atherosclerosis. Mild cardiomegaly. No pericardial effusion. Mediastinum/Nodes: Motion and lack of contrast limits assessment for adenopathy. Small mediastinal lymph nodes not definitively enlarged. Assessment for hilar adenopathy is significantly limited. Esophagus is patulous. No suspicious thyroid nodule. Lungs/Pleura: Moderate right and small left pleural effusion. Adjacent compressive atelectasis. There are multifocal geographic patchy ground-glass opacities throughout both lungs. Trachea and mainstem bronchi are grossly patent, partially motion obscured. Musculoskeletal: Generalized subcutaneous edema throughout the chest wall. Diffuse degenerative change throughout the thoracic spine. There are no acute or suspicious osseous abnormalities. CT ABDOMEN PELVIS FINDINGS Hepatobiliary: No evidence of focal hepatic abnormality on noncontrast exam. Liver is prominent size spanning 20 cm cranial caudal. Question of nodular hepatic contours. Hyperdense gallbladder contents. No calcified gallstone. Common bile duct is not well-defined. Pancreas: No pancreatic ductal dilatation. No disproportionate peripancreatic edema. Spleen: Normal in size without focal abnormality. Adrenals/Urinary Tract: Adrenal thickening without dominant nodule. Chronic left renal atrophy. Left ureter is nondilated, unchanged 7 mm calcification in the left pelvis equivocally within the ureter. Right kidney appears normal in size. No hydronephrosis or renal calculi. Urinary bladder is distended and unremarkable. Stomach/Bowel: Bowel evaluation is limited  in the absence of contrast, presence of ascites, and motion artifact. Nondistended stomach, grossly unremarkable. No small bowel obstruction. Appendix not visualized. Moderate volume of stool throughout the colon. There is mild colonic redundancy. No obvious colonic wall thickening or pericolonic inflammation. Vascular/Lymphatic: Abdominal aorta is normal in caliber. No bulky abdominopelvic adenopathy. Reproductive: Uterus is slightly bulky. Limited assessment for adnexal mass given pelvic ascites. Other: Small to moderate volume abdominopelvic ascites. There is generalized edema of the intra-abdominal fat. Prominent generalized edema of the subcutaneous fat, confluent in the flanks. No free intra-abdominal air. Musculoskeletal: Degenerative change in the lumbar spine. There are no acute or suspicious osseous abnormalities. IMPRESSION: 1. Multifocal geographic patchy ground-glass opacities throughout both lungs, consistent with COVID-19 pneumonia. 2. Fluid overload. Moderate right and small left pleural effusion. Small to moderate volume abdominopelvic ascites. Generalized subcutaneous edema of the chest and abdomen consistent with third-spacing, confluent edema in the flanks. 3. Question of nodular hepatic contours, recommend correlation for cirrhosis. 4. Chronic left renal atrophy. Unchanged 7 mm calcification in the left pelvis equivocally within the ureter. No hydronephrosis. Aortic Atherosclerosis (ICD10-I70.0). Electronically Signed   By: Keith Rake M.D.   On: 11/07/2019 23:00   CT CHEST WO CONTRAST  Result Date: 11/07/2019 CLINICAL DATA:  Generalized weakness and lethargy. Sepsis. Ascites. Cough. COVID positive yesterday. EXAM: CT CHEST, ABDOMEN AND PELVIS WITHOUT CONTRAST TECHNIQUE: Multidetector CT imaging of the chest, abdomen and pelvis was performed following the standard protocol without IV contrast. COMPARISON:  Radiograph yesterday. Abdominopelvic CT 09/15/2019 FINDINGS: CT CHEST FINDINGS  Cardiovascular: Right upper extremity PICC with tip in the SVC. Motion artifact partially obscures detailed evaluation. Normal caliber thoracic aorta. Mild atherosclerosis. Mild cardiomegaly. No pericardial effusion. Mediastinum/Nodes: Motion and lack of contrast limits assessment for adenopathy. Small mediastinal lymph nodes not definitively enlarged. Assessment for hilar adenopathy is significantly limited. Esophagus is patulous. No suspicious thyroid nodule. Lungs/Pleura: Moderate right and small left pleural effusion. Adjacent compressive atelectasis. There are multifocal geographic patchy ground-glass opacities throughout both lungs. Trachea and mainstem bronchi are grossly patent, partially motion obscured. Musculoskeletal: Generalized subcutaneous edema throughout the chest wall. Diffuse degenerative change throughout the thoracic spine. There are  no acute or suspicious osseous abnormalities. CT ABDOMEN PELVIS FINDINGS Hepatobiliary: No evidence of focal hepatic abnormality on noncontrast exam. Liver is prominent size spanning 20 cm cranial caudal. Question of nodular hepatic contours. Hyperdense gallbladder contents. No calcified gallstone. Common bile duct is not well-defined. Pancreas: No pancreatic ductal dilatation. No disproportionate peripancreatic edema. Spleen: Normal in size without focal abnormality. Adrenals/Urinary Tract: Adrenal thickening without dominant nodule. Chronic left renal atrophy. Left ureter is nondilated, unchanged 7 mm calcification in the left pelvis equivocally within the ureter. Right kidney appears normal in size. No hydronephrosis or renal calculi. Urinary bladder is distended and unremarkable. Stomach/Bowel: Bowel evaluation is limited in the absence of contrast, presence of ascites, and motion artifact. Nondistended stomach, grossly unremarkable. No small bowel obstruction. Appendix not visualized. Moderate volume of stool throughout the colon. There is mild colonic  redundancy. No obvious colonic wall thickening or pericolonic inflammation. Vascular/Lymphatic: Abdominal aorta is normal in caliber. No bulky abdominopelvic adenopathy. Reproductive: Uterus is slightly bulky. Limited assessment for adnexal mass given pelvic ascites. Other: Small to moderate volume abdominopelvic ascites. There is generalized edema of the intra-abdominal fat. Prominent generalized edema of the subcutaneous fat, confluent in the flanks. No free intra-abdominal air. Musculoskeletal: Degenerative change in the lumbar spine. There are no acute or suspicious osseous abnormalities. IMPRESSION: 1. Multifocal geographic patchy ground-glass opacities throughout both lungs, consistent with COVID-19 pneumonia. 2. Fluid overload. Moderate right and small left pleural effusion. Small to moderate volume abdominopelvic ascites. Generalized subcutaneous edema of the chest and abdomen consistent with third-spacing, confluent edema in the flanks. 3. Question of nodular hepatic contours, recommend correlation for cirrhosis. 4. Chronic left renal atrophy. Unchanged 7 mm calcification in the left pelvis equivocally within the ureter. No hydronephrosis. Aortic Atherosclerosis (ICD10-I70.0). Electronically Signed   By: Keith Rake M.D.   On: 11/07/2019 23:00   DG Chest Port 1 View  Result Date: 11/09/2019 CLINICAL DATA:  Weakness, COVID-19 positive EXAM: PORTABLE CHEST 1 VIEW COMPARISON:  Chest radiograph from one day prior. FINDINGS: Right PICC terminates in the lower third of the SVC. Stable cardiomediastinal silhouette with mild cardiomegaly. No pneumothorax. No pleural effusion. Extensive patchy opacities throughout both lungs, slightly worsened in the upper lungs. IMPRESSION: Extensive patchy opacities throughout both lungs, slightly worsened in the upper lungs, compatible with COVID-19 pneumonia, although with component of pulmonary edema not excluded. Mild cardiomegaly. Electronically Signed   By: Ilona Sorrel M.D.   On: 11/09/2019 08:19   DG Chest Port 1 View  Result Date: 11/08/2019 CLINICAL DATA:  Shortness of breath, COVID-19, diabetes mellitus, hypertension, stage III chronic kidney disease, history endometrial cancer EXAM: PORTABLE CHEST 1 VIEW COMPARISON:  Portable exam 0816 hours compared to 11/06/2019 FINDINGS: RIGHT arm PICC line tip projects over SVC. Enlargement of cardiac silhouette with stable mediastinal contours. Extensive BILATERAL pulmonary infiltrates greatest perihilar on RIGHT, question asymmetric pulmonary edema versus multifocal infection. No pleural effusion or pneumothorax. Bones demineralized with scattered degenerative changes of the thoracic spine. IMPRESSION: Enlargement of cardiac silhouette with increased BILATERAL pulmonary infiltrates greatest RIGHT perihilar question asymmetric pulmonary edema versus multifocal pneumonia. Electronically Signed   By: Lavonia Dana M.D.   On: 11/08/2019 08:51   DG CHEST PORT 1 VIEW  Result Date: 11/06/2019 CLINICAL DATA:  Weakness. EXAM: PORTABLE CHEST 1 VIEW COMPARISON:  November 05, 2019. FINDINGS: Stable cardiomegaly with possible central pulmonary vascular congestion. No pneumothorax is noted. Mild bibasilar atelectasis is noted with small pleural effusions. Bony thorax is unremarkable. IMPRESSION: Stable cardiomegaly with  possible central pulmonary vascular congestion. Mild bibasilar subsegmental atelectasis is noted with small pleural effusions. Electronically Signed   By: Marijo Conception M.D.   On: 11/06/2019 15:57   DG Chest Port 1 View  Result Date: 11/05/2019 CLINICAL DATA:  Weakness. Found on floor today. EXAM: PORTABLE CHEST 1 VIEW COMPARISON:  10/18/2019 FINDINGS: Stable heart size and mediastinal contours with mild cardiomegaly. Hazy opacity at both lung bases likely combination of pleural fluid and airspace disease/atelectasis. Vascular congestion. No pneumothorax. Left proximal humerus fracture appears subacute with surrounding  heterotopic ossification, also seen on 09/17/2019 shoulder radiograph. IMPRESSION: 1. Hazy opacity at both lung bases likely combination of pleural fluid and airspace disease/atelectasis. This is stable on the right but progressive on the left from prior. 2. Stable mild cardiomegaly.  Vascular congestion. Electronically Signed   By: Keith Rake M.D.   On: 11/05/2019 17:02   DG Chest Portable 1 View  Result Date: 10/18/2019 CLINICAL DATA:  Hypertension and shortness of breath EXAM: PORTABLE CHEST 1 VIEW COMPARISON:  September 17, 2019 FINDINGS: Ill-defined opacity in the right mid and lower lung regions noted with suspected small superimposed pleural effusion. There is mild left base atelectasis. Left lung otherwise clear. Heart is mildly enlarged with pulmonary vascularity normal. No adenopathy. There is degenerative change in the thoracic spine. IMPRESSION: Ill-defined opacity right mid and lower lung zones, likely due to a degree of pneumonia with superimposed small pleural effusion. Mild left base atelectasis. Stable cardiac prominence. No adenopathy evident. Electronically Signed   By: Lowella Grip III M.D.   On: 10/18/2019 14:06   VAS [place] LOWER EXTREMITY VENOUS (DVT)  Result Date: 11/07/2019  Lower Venous DVTStudy Indications: Swelling, and Edema.  Limitations: Body habitus and poor ultrasound/tissue interface. Comparison Study: 09/16/19 previous Performing Technologist: Abram Sander RVS  Examination Guidelines: A complete evaluation includes B-mode imaging, spectral Doppler, color Doppler, and power Doppler as needed of all accessible portions of each vessel. Bilateral testing is considered an integral part of a complete examination. Limited examinations for reoccurring indications may be performed as noted. The reflux portion of the exam is performed with the patient in reverse Trendelenburg.  +---------+---------------+---------+-----------+----------+--------------+ RIGHT     CompressibilityPhasicitySpontaneityPropertiesThrombus Aging +---------+---------------+---------+-----------+----------+--------------+ CFV      Full           Yes      Yes                                 +---------+---------------+---------+-----------+----------+--------------+ SFJ      Full                                                        +---------+---------------+---------+-----------+----------+--------------+ FV Prox  Full                                                        +---------+---------------+---------+-----------+----------+--------------+ FV Mid  Not visualized +---------+---------------+---------+-----------+----------+--------------+ FV Distal               Yes      Yes                                 +---------+---------------+---------+-----------+----------+--------------+ PFV      Full                                                        +---------+---------------+---------+-----------+----------+--------------+ POP      Full           Yes      Yes                                 +---------+---------------+---------+-----------+----------+--------------+ PTV                                                   Not visualized +---------+---------------+---------+-----------+----------+--------------+ PERO                                                  Not visualized +---------+---------------+---------+-----------+----------+--------------+   +---------+---------------+---------+-----------+----------+--------------+ LEFT     CompressibilityPhasicitySpontaneityPropertiesThrombus Aging +---------+---------------+---------+-----------+----------+--------------+ CFV      Full           Yes      Yes                                 +---------+---------------+---------+-----------+----------+--------------+ SFJ      Full                                                         +---------+---------------+---------+-----------+----------+--------------+ FV Prox  Full                                                        +---------+---------------+---------+-----------+----------+--------------+ FV Mid                  Yes      Yes                                 +---------+---------------+---------+-----------+----------+--------------+ FV Distal               Yes      Yes                                 +---------+---------------+---------+-----------+----------+--------------+ PFV      Full                                                        +---------+---------------+---------+-----------+----------+--------------+  POP      Full           Yes      Yes                                 +---------+---------------+---------+-----------+----------+--------------+ PTV      Full                                                        +---------+---------------+---------+-----------+----------+--------------+ PERO                                                  Not visualized +---------+---------------+---------+-----------+----------+--------------+     Summary: BILATERAL: - No evidence of deep vein thrombosis seen in the lower extremities, bilaterally.   *See table(s) above for measurements and observations. Electronically signed by Curt Jews MD on 11/07/2019 at 4:26:57 PM.    Final    [place] EKG SITE RITE  Result Date: 11/07/2019 If Site Rite image not attached, placement could not be confirmed due to current cardiac rhythm.    Phillips Climes M.D on 11/15/2019 at 3:12 PM  To page go to [email or web address] - password [facility]

## 2019-11-15 NOTE — TOC Progression Note (Signed)
Transition of Care [facility]) - Progression Note    Patient Details  Name: Vanessa [name] MRN: [id number] Date of Birth: 1955-07-17  [facility] CM/SW [place], LCSW Phone Number: 11/15/2019, 9:23 AM  Clinical Narrative:    CSW continuing to follow for medical readiness. Will follow up on Monday.    Expected Discharge Plan: Skilled Nursing Facility Barriers to Discharge: Continued Medical Work up  Expected Discharge Plan and Services Expected Discharge Plan: [place] In-house Referral: Clinical Social Work Discharge Planning Services: CM Consult Post Acute Care Choice: [place] Living arrangements for the past 2 months: [place]                                       Social Determinants of Health (SDOH) Interventions    Readmission Risk Interventions Readmission Risk Prevention Plan 11/08/2019 10/21/2019 09/25/2019  Transportation Screening Complete Complete Complete  PCP or Specialist Appt within 3-5 Days - - Not Complete  Not Complete comments - - plan for SNF  HRI or [place] - - Complete  Social Work Consult for [place] Planning/Counseling - - Complete  Palliative Care Screening - - Not Applicable  Medication Review Press photographer) Complete Referral to Pharmacy Referral to Pharmacy  PCP or Specialist appointment within 3-5 days of discharge Complete Complete -  [place] or Home Care Consult Complete (No Data) -  SW Recovery Care/Counseling Consult Complete Complete -  Palliative Care Screening Not Applicable Not Applicable -  Skilled Nursing Facility Complete Complete -  Some recent data might be hidden

## 2019-11-16 ENCOUNTER — Inpatient Hospital Stay (HOSPITAL_COMMUNITY): Payer: Medicare Other

## 2019-11-16 DIAGNOSIS — E871 Hypo-osmolality and hyponatremia: Secondary | ICD-10-CM

## 2019-11-16 LAB — COMPREHENSIVE METABOLIC PANEL
ALT: 6 U/L (ref 0–44)
AST: 10 U/L — ABNORMAL LOW (ref 15–41)
Albumin: 2.5 g/dL — ABNORMAL LOW (ref 3.5–5.0)
Alkaline Phosphatase: 87 U/L (ref 38–126)
Anion gap: 10 (ref 5–15)
BUN: 48 mg/dL — ABNORMAL HIGH (ref 8–23)
CO2: 29 mmol/L (ref 22–32)
Calcium: 8.6 mg/dL — ABNORMAL LOW (ref 8.9–10.3)
Chloride: 114 mmol/L — ABNORMAL HIGH (ref 98–111)
Creatinine, Ser: 1.71 mg/dL — ABNORMAL HIGH (ref 0.44–1.00)
GFR calc Af Amer: 36 mL/min — ABNORMAL LOW (ref >60)
GFR calc non Af Amer: 31 mL/min — ABNORMAL LOW (ref >60)
Glucose, Bld: 119 mg/dL — ABNORMAL HIGH (ref 70–99)
Potassium: 3.7 mmol/L (ref 3.5–5.1)
Sodium: 153 mmol/L — ABNORMAL HIGH (ref 135–145)
Total Bilirubin: 0.8 mg/dL (ref 0.3–1.2)
Total Protein: 6 g/dL — ABNORMAL LOW (ref 6.5–8.1)

## 2019-11-16 LAB — CBC
HCT: 28 % — ABNORMAL LOW (ref 36.0–46.0)
Hemoglobin: 8.1 g/dL — ABNORMAL LOW (ref 12.0–15.0)
MCH: 28.4 pg (ref 26.0–34.0)
MCHC: 28.9 g/dL — ABNORMAL LOW (ref 30.0–36.0)
MCV: 98.2 fL (ref 80.0–100.0)
Platelets: 128 10*3/uL — ABNORMAL LOW (ref 150–400)
RBC: 2.85 MIL/uL — ABNORMAL LOW (ref 3.87–5.11)
RDW: 18.9 % — ABNORMAL HIGH (ref 11.5–15.5)
WBC: 6.9 10*3/uL (ref 4.0–10.5)
nRBC: 0.3 % — ABNORMAL HIGH (ref 0.0–0.2)

## 2019-11-16 LAB — GLUCOSE, CAPILLARY
Glucose-Capillary: 104 mg/dL — ABNORMAL HIGH (ref 70–99)
Glucose-Capillary: 123 mg/dL — ABNORMAL HIGH (ref 70–99)
Glucose-Capillary: 127 mg/dL — ABNORMAL HIGH (ref 70–99)
Glucose-Capillary: 98 mg/dL (ref 70–99)

## 2019-11-16 MED ORDER — CLONIDINE HCL 0.2 MG PO TABS
0.2000 mg | ORAL_TABLET | Freq: Two times a day (BID) | ORAL | Status: DC
  Administered 2019-11-16 – 2019-11-22 (×13): 0.2 mg via ORAL
  Filled 2019-11-16 (×13): qty 1

## 2019-11-16 MED ORDER — DEXTROSE 5 % IV SOLN: INTRAVENOUS | Status: DC

## 2019-11-16 MED ORDER — ALBUMIN HUMAN 25 % IV SOLN
12.5000 g | Freq: Four times a day (QID) | INTRAVENOUS | Status: AC
  Administered 2019-11-16 – 2019-11-17 (×4): 12.5 g via INTRAVENOUS
  Filled 2019-11-16 (×4): qty 50

## 2019-11-16 MED ORDER — HYDROCHLOROTHIAZIDE 12.5 MG PO CAPS
12.5000 mg | ORAL_CAPSULE | Freq: Every day | ORAL | Status: DC
  Administered 2019-11-16 – 2019-11-20 (×5): 12.5 mg via ORAL
  Filled 2019-11-16 (×5): qty 1

## 2019-11-16 NOTE — Plan of Care (Addendum)
Dr. Hal Hope notified via [place] about patients status, change in breathsounds, exertional dyspnea, BP, and pink urine color Dr. Myna Hidalgo notified of patient status change. .  Order for CXR today received. Bear hugger warming blanket removed per order of temperature remaining >35 C.

## 2019-11-16 NOTE — Progress Notes (Signed)
PROGRESS NOTE                                                                                                                                                                                                             Patient Demographics:    Vanessa [name], is a 64 y.o. female, DOB - Dec 06, 1955, WGN:[id number]  Admit date - 11/05/2019   Admitting Physician Mckinley Jewel, MD  Outpatient Primary MD for the patient is Maren Reamer, MD (Inactive)  LOS - 10  CC - fever     Brief Narrative    -Vanessa [name] is a 64 y.o. female with medical history significant of hypertension, diabetes, hyperlipidemia, grade 2 diastolic congestive heart failure, CKD stage IIIb, endometrial cancer presents to emergency department due to generalized weakness and lethargy, she was diagnosed with COVID-19 pneumonia with sepsis and hypothermia and admitted to the hospital.    She was recently hospitalized for MSSA bacteremia few weeks ago.  She also carries a history of endometrial cancer under the care of Dr. Berline Lopes GYN oncology deemed a poor surgical candidate for now, also recent GI bleed due to duodenal ulcer.  She has completely recovered from COVID-19 pneumonia her only issue now is persistent hypothermia of unclear etiology, stable TSH and cortisol, appears completely nontoxic, blood cultures negative stable procalcitonin.  Continue providing supportive care.   Subjective:   Patient in bed, appears comfortable, denies any headache, reports her appetite has improved, .   Assessment  & Plan :     Sepsis due COVID 19 infection -  Etiology not entirely clear, will to be most likely due to Covid, she is hypothermic without leukocytosis, does have COVID-19 infection and mild pneumonia this admission , she recently had MSSA bacteremia few weeks ago. - At this point repeat blood cultures were ordered and stayed negative, stable procalcitonin, cefazolin has been stopped, she has completed her  remdesivir and steroid course for COVID-19 pneumonia, CT chest abdomen pelvis nonacute.  Continue supportive care. -Reports she received the first dose of Covid vaccine last March, she did not receive the second vaccine shot yet.  Hypothermia.   -So far no clear etiology despite extensive work-up, so far there is no evidence of any infectious process. -  currently with stable procalcitonin, TSH, free T4 and cortisol level and negative blood cultures. - could be central in origin due to reset thermostat, labs including TSH TSH cortisol stable, appears nontoxic, negative blood cultures, stable procalcitonin, completely symptom-free, supportive  care with bear hugger, increase room temperature and monitor. -Her medication regimen has been adjusted recently, I have stopped her beta-blocker as case reports of its causing hypothermia, as well I have decreased her hydralazine to see if it helps with her hypothermia, given its vasodilatory effect. -Recurrent hypothermia requiring intermittent warming blanket, so I have instructed Foley with thermometer for more accurate constant temperature reads .  Hypernatremia -Overall patient is volume overloaded, but this is most likely due to third spacing, she is intravascularly volume depleted, could not tolerate any Lasix given Inc. significantly increased sodium level, so for now I will start on D5W encouraged her water intake we will start diuresis once appropriate. -I will continue with IV albumin given her low albumin level, and it does seem to help her temperature. -We will check right upper quadrant ultrasound to rule out cirrhosis causing her anasarca.  Endometrial cancer with intermittent bleeding and spotting.   - Stable H&H, will type screen, outpatient follow-up with OB oncologist Dr. Berline Lopes once discharged. -Reviewing Dr. Berline Lopes notes, patient plan to be treated with IUD, but she needs to finish 7 days of Flagyl for trichomonas infection, started on  Flagyl, to finish total of 7 days.  History of duodenal ulcer with GI bleed.   -She is on PPI, have increased to twice daily, especially as she is being started on DVT prophylaxis dose .  Borderline elevated D-dimer.   - Could be due to her endometrial bleed history, negative lower extremity ultrasound. Tough situation due to recent history of upper GI bleed with ongoing intermittent bleed from her vagina.  But she is not much ambulatory, with COVID-19, elevated D-dimers and malignancy which puts her at significant hypercoagulable state and risk, given her GI bleed more than 4 weeks ago, and her hemoglobin has been stable, I did start her on subcu heparin for DVT prophylaxis, and increased her Protonix to twice daily.  Hypertension.   -Blood pressure overall is elevated, her regimen has been changed multiple times during recent admissions given its hard to control . -For now I will continue with Norvasc, doxazosin, I have stopped her Coreg and decrease hydralazine to see if this helps with hypothermia please see discussion below , blood pressure remains significantly elevated, so I will increase her clonidine today .  Intravascular dehydration with hypernatremia.  -Currently appears to be volume overloaded.   Leg edema mostly nonpitting.  -Started on IV diuresis, but it has been stopped given worsening hypernatremia -Initial imaging showing some evidence of cirrhosis, ammonia level, and give some IV albumin for albumin level of 2.2  MIcrocytic iron deficiency anemia.  On oral iron supplementation likely due to ongoing intermittent vaginal blood loss, outpatient follow-up with PCP and GYN oncology.  Hypokalemia  -Replete  as needed   DM type II.  ISS.  Follow closely blood sugars as she is now on D5W and Decadron.  May require Lantus and adjustment of short-acting insulin.  Lab Results  Component Value Date   HGBA1C 5.7 (H) 09/15/2019   CBG (last 3)  Recent Labs    11/15/19 2124  11/16/19 0751 11/16/19 1153  GLUCAP 123* 104* 123*      Condition.  Extremely guarded.   Family Communication  :  husband Meyerhoff [phone] on 5/7.  Code Status :  Full  Disposition Plan  :    Status is: Inpatient  Remains inpatient appropriate because:Hemodynamically unstable, remains with significant hypothermia   Dispo: The patient is from: SNF  Anticipated d/c is to: SNF              Anticipated d/c date is: > 3 days              Patient currently is not medically stable to d/c.   Consults  :  None  Procedures  :    DVT Prophylaxis  :  [place] Heparin     Lab Results  Component Value Date   PLT 128 (L) 11/16/2019    Diet :  Diet Order            Diet NPO time specified Except for: Sips with Meds  Diet effective now               Inpatient Medications Scheduled Meds: . albuterol  2 puff Inhalation Q4H  . amLODipine  10 mg Oral Daily  . vitamin C  500 mg Oral Daily  . Chlorhexidine Gluconate Cloth  6 each Topical Daily  . cloNIDine  0.2 mg Oral BID  . doxazosin  4 mg Oral Daily  . feeding supplement (ENSURE ENLIVE)  237 mL Oral TID BM  . feeding supplement (PRO-STAT SUGAR FREE 64)  30 mL Oral TID WC  . ferrous sulfate  325 mg Oral TID WC  . heparin injection (subcutaneous)  5,000 Units Subcutaneous Q8H  . hydrALAZINE  50 mg Oral Q8H  . hydrochlorothiazide  12.5 mg Oral Daily  . insulin aspart  0-15 Units Subcutaneous TID WC  . insulin aspart  0-5 Units Subcutaneous QHS  . mouth rinse  15 mL Mouth Rinse BID  . metroNIDAZOLE  500 mg Oral Q8H  . pantoprazole sodium  40 mg Oral BID  . polyethylene glycol  17 g Oral Daily  . zinc sulfate  220 mg Oral Daily   Continuous Infusions: . dextrose 50 mL/hr at 11/16/19 1058   PRN Meds:.acetaminophen, capsaicin, guaiFENesin-dextromethorphan, loratadine, [DISCONTINUED] ondansetron **OR** ondansetron (ZOFRAN) IV, sodium chloride flush  Antibiotics  :   Anti-infectives (From admission, onward)    Start     Dose/Rate Route Frequency Ordered Stop   11/15/19 1600  metroNIDAZOLE (FLAGYL) tablet 500 mg     500 mg Oral Every 8 hours 11/15/19 1520     11/11/19 1000  doxycycline (VIBRA-TABS) tablet 100 mg  Status:  Discontinued     100 mg Oral Every 12 hours 11/11/19 0729 11/12/19 0921   11/09/19 1230  vancomycin (VANCOREADY) IVPB 1250 mg/250 mL  Status:  Discontinued     1,250 mg 166.7 mL/hr over 90 Minutes Intravenous Every 48 hours 11/07/19 1100 11/09/19 1318   11/08/19 1000  remdesivir 100 mg in sodium chloride 0.9 % 100 mL IVPB     100 mg 200 mL/hr over 30 Minutes Intravenous Daily 11/07/19 1217 11/11/19 1054   11/07/19 1600  piperacillin-tazobactam (ZOSYN) IVPB 3.375 g  Status:  Discontinued     3.375 g 12.5 mL/hr over 240 Minutes Intravenous Every 8 hours 11/07/19 1033 11/11/19 0729   11/07/19 1245  remdesivir 200 mg in sodium chloride 0.9% 250 mL IVPB     200 mg 580 mL/hr over 30 Minutes Intravenous Once 11/07/19 1216 11/07/19 1728   11/07/19 1130  vancomycin (VANCOREADY) IVPB 2000 mg/400 mL     2,000 mg 200 mL/hr over 120 Minutes Intravenous  Once 11/07/19 1053 11/07/19 1432   11/07/19 1100  piperacillin-tazobactam (ZOSYN) IVPB 3.375 g     3.375 g 100 mL/hr over 30 Minutes Intravenous  Once 11/07/19 1033 11/07/19 1303  Objective:   Vitals:   11/16/19 1100 11/16/19 1106 11/16/19 1153 11/16/19 1300  BP:   (!) 168/81 (!) 161/83  Pulse: 74  62 71  Resp: (!) 24 20 (!) 24 (!) 22  Temp: (!) 95.5 F (35.3 C) (!) 95.5 F (35.3 C) (!) 95.5 F (35.3 C) (!) 95.9 F (35.5 C)  TempSrc:      SpO2: 97%  99% 100%  Weight:      Height:        SpO2: 100 % O2 Flow Rate (L/min): 1 L/min  Wt Readings from Last 3 Encounters:  11/16/19 96.7 kg  10/18/19 100.3 kg  10/17/19 94.9 kg     Intake/Output Summary (Last 24 hours) at 11/16/2019 1359 Last data filed at 11/16/2019 0558 Gross per 24 hour  Intake 360 ml  Output 950 ml  Net -590 ml     Physical Exam  Awake  Alert, Oriented X 3, but overall she is with fluctuating mental status and easily distracted Symmetrical Chest wall movement, Minister entry bilaterally RRR,No Gallops,Rubs or new Murmurs, No Parasternal Heave +ve B.Sounds, Abd Soft, No tenderness, No rebound - guarding or rigidity. No Cyanosis, Clubbing, +2 edema, No new Rash or bruise          Data Review:    Recent Labs  Lab 11/10/19 0410 11/10/19 0410 11/11/19 2536 11/11/19 6440 11/12/19 0519 11/13/19 0547 11/14/19 0259 11/15/19 0206 11/16/19 0330  WBC 6.4   < > 7.3   < > 11.1* 8.0 5.0 6.0 6.9  HGB 9.1*   < > 9.4*   < > 10.2* 9.5* 8.9* 8.7* 8.1*  HCT 30.5*   < > 31.2*   < > 33.6* 31.4* 29.5* 29.5* 28.0*  PLT 164   < > 145*   < > 147* 129* 101* 120* 128*  MCV 96.2   < > 97.8   < > 96.6 96.0 95.8 96.7 98.2  MCH 28.7   < > 29.5   < > 29.3 29.1 28.9 28.5 28.4  MCHC 29.8*   < > 30.1   < > 30.4 30.3 30.2 29.5* 28.9*  RDW 18.4*   < > 18.3*   < > 18.3* 18.1* 18.2* 18.6* 18.9*  LYMPHSABS 0.3*  --  0.3*  --  0.3* 0.4* 0.4*  --   --   MONOABS 0.3  --  0.2  --  0.4 0.4 0.2  --   --   EOSABS 0.0  --  0.0  --  0.0 0.0 0.0  --   --   BASOSABS 0.0  --  0.0  --  0.0 0.0 0.0  --   --    < > = values in this interval not displayed.    Recent Labs  Lab 11/10/19 0410 11/10/19 0410 11/11/19 3474 11/11/19 2595 11/12/19 0519 11/13/19 0547 11/13/19 1730 11/14/19 0259 11/15/19 0206 11/15/19 2110 11/16/19 0330  NA 145   < > 148*   < > 146* 146*  --  147* 150*  --  153*  K 3.4*   < > 3.5   < > 3.1* 3.0*  --  3.7 3.8  --  3.7  CL 108   < > 110   < > 108 110  --  113* 115*  --  114*  CO2 26   < > 25   < > 25 25  --  25 28  --  29  GLUCOSE 169*   < > 171*   < >  130* 126*  --  138* 148*  --  119*  BUN 41*   < > 43*   < > 42* 44*  --  48* 47*  --  48*  CREATININE 1.85*   < > 1.97*   < > 1.79* 1.77*  --  1.67* 1.68*  --  1.71*  CALCIUM 8.5*   < > 8.2*   < > 8.3* 8.3*  --  8.0* 8.1*  --  8.6*  AST 17   < > 17   < > 16 13*  --  12* 11*   --  10*  ALT 6   < > 5   < > 6 6  --  6 6  --  6  ALKPHOS 91   < > 90   < > 89 98  --  95 93  --  87  BILITOT 0.7   < > 0.7   < > 1.0 0.8  --  0.8 0.8  --  0.8  ALBUMIN 2.4*   < > 2.4*   < > 2.5* 2.2*  --  2.2* 2.2*  --  2.5*  MG 1.9  --  1.9  --  1.7 1.8  --  1.8  --   --   --   CRP 7.1*  --  5.5*  --  4.4* 9.1*  --  8.8*  --   --   --   DDIMER 2.51*  --  2.47*  --  2.95* 2.39*  --  1.85*  --   --   --   PROCALCITON  --   --   --   --   --   --  0.25  --   --   --   --   TSH  --   --   --   --   --   --  4.135  --   --   --   --   AMMONIA  --   --   --   --   --   --   --   --   --  22  --    < > = values in this interval not displayed.    Recent Labs  Lab 11/10/19 0410 11/11/19 0338 11/12/19 0519 11/13/19 0547 11/13/19 1730 11/14/19 0259  CRP 7.1* 5.5* 4.4* 9.1*  --  8.8*  DDIMER 2.51* 2.47* 2.95* 2.39*  --  1.85*  PROCALCITON  --   --   --   --  0.25  --     ------------------------------------------------------------------------------------------------------------------ No results for input(s): CHOL, HDL, LDLCALC, TRIG, CHOLHDL, LDLDIRECT in the last 72 hours.  Lab Results  Component Value Date   HGBA1C 5.7 (H) 09/15/2019   ------------------------------------------------------------------------------------------------------------------ Recent Labs    11/13/19 1730  TSH 4.135   ------------------------------------------------------------------------------------------------------------------ No results for input(s): VITAMINB12, FOLATE, FERRITIN, TIBC, IRON, RETICCTPCT in the last 72 hours.  Coagulation profile No results for input(s): INR, PROTIME in the last 168 hours.  Recent Labs    11/14/19 0259  DDIMER 1.85*    Cardiac Enzymes No results for input(s): CKMB, TROPONINI, MYOGLOBIN in the last 168 hours.  Invalid input(s): CK ------------------------------------------------------------------------------------------------------------------    Component  Value Date/Time   BNP 1,043.9 (H) 11/09/2019 0430    Micro Results Recent Results (from the past 240 hour(s))  Culture, blood (Routine X 2) w Reflex to ID Panel     Status: None   Collection Time: 11/06/19  9:40 PM  Specimen: BLOOD  Result Value Ref Range Status   Specimen Description BLOOD RIGHT ANTECUBITAL  Final   Special Requests   Final    BOTTLES DRAWN AEROBIC AND ANAEROBIC Blood Culture adequate volume   Culture   Final    NO GROWTH 5 DAYS Performed at [facility], [address], [place], [place] [address]    Report Status 11/11/2019 FINAL  Final  Culture, blood (Routine X 2) w Reflex to ID Panel     Status: None   Collection Time: 11/06/19  9:49 PM   Specimen: BLOOD  Result Value Ref Range Status   Specimen Description BLOOD LEFT ANTECUBITAL  Final   Special Requests   Final    BOTTLES DRAWN AEROBIC ONLY Blood Culture adequate volume   Culture   Final    NO GROWTH 5 DAYS Performed at [facility], [place] [address], [place], [place] [address]    Report Status 11/11/2019 FINAL  Final  Culture, blood (routine x 2)     Status: None (Preliminary result)   Collection Time: 11/13/19  6:09 PM   Specimen: BLOOD  Result Value Ref Range Status   Specimen Description BLOOD LEFT ANTECUBITAL  Final   Special Requests   Final    BOTTLES DRAWN AEROBIC AND ANAEROBIC Blood Culture adequate volume   Culture   Final    NO GROWTH 3 DAYS Performed at [facility], [place] [address] [place], [place] [address]    Report Status PENDING  Incomplete  Culture, blood (routine x 2)     Status: None (Preliminary result)   Collection Time: 11/13/19  6:16 PM   Specimen: BLOOD LEFT HAND  Result Value Ref Range Status   Specimen Description BLOOD LEFT HAND  Final   Special Requests   Final    BOTTLES DRAWN AEROBIC AND ANAEROBIC Blood Culture adequate volume   Culture   Final    NO GROWTH 3 DAYS Performed at [facility], [place] [address], [place], [place] [address]     Report Status PENDING  Incomplete    Radiology Reports CT ABDOMEN PELVIS WO CONTRAST  Result Date: 11/07/2019 CLINICAL DATA:  Generalized weakness and lethargy. Sepsis. Ascites. Cough. COVID positive yesterday. EXAM: CT CHEST, ABDOMEN AND PELVIS WITHOUT CONTRAST TECHNIQUE: Multidetector CT imaging of the chest, abdomen and pelvis was performed following the standard protocol without IV contrast. COMPARISON:  Radiograph yesterday. Abdominopelvic CT 09/15/2019 FINDINGS: CT CHEST FINDINGS Cardiovascular: Right upper extremity PICC with tip in the SVC. Motion artifact partially obscures detailed evaluation. Normal caliber thoracic aorta. Mild atherosclerosis. Mild cardiomegaly. No pericardial effusion. Mediastinum/Nodes: Motion and lack of contrast limits assessment for adenopathy. Small mediastinal lymph nodes not definitively enlarged. Assessment for hilar adenopathy is significantly limited. Esophagus is patulous. No suspicious thyroid nodule. Lungs/Pleura: Moderate right and small left pleural effusion. Adjacent compressive atelectasis. There are multifocal geographic patchy ground-glass opacities throughout both lungs. Trachea and mainstem bronchi are grossly patent, partially motion obscured. Musculoskeletal: Generalized subcutaneous edema throughout the chest wall. Diffuse degenerative change throughout the thoracic spine. There are no acute or suspicious osseous abnormalities. CT ABDOMEN PELVIS FINDINGS Hepatobiliary: No evidence of focal hepatic abnormality on noncontrast exam. Liver is prominent size spanning 20 cm cranial caudal. Question of nodular hepatic contours. Hyperdense gallbladder contents. No calcified gallstone. Common bile duct is not well-defined. Pancreas: No pancreatic ductal dilatation. No disproportionate peripancreatic edema. Spleen: Normal in size without focal abnormality. Adrenals/Urinary Tract: Adrenal thickening without dominant nodule. Chronic  left renal atrophy. Left ureter is  nondilated, unchanged 7 mm calcification in the left pelvis equivocally within the ureter. Right kidney appears normal in size. No hydronephrosis or renal calculi. Urinary bladder is distended and unremarkable. Stomach/Bowel: Bowel evaluation is limited in the absence of contrast, presence of ascites, and motion artifact. Nondistended stomach, grossly unremarkable. No small bowel obstruction. Appendix not visualized. Moderate volume of stool throughout the colon. There is mild colonic redundancy. No obvious colonic wall thickening or pericolonic inflammation. Vascular/Lymphatic: Abdominal aorta is normal in caliber. No bulky abdominopelvic adenopathy. Reproductive: Uterus is slightly bulky. Limited assessment for adnexal mass given pelvic ascites. Other: Small to moderate volume abdominopelvic ascites. There is generalized edema of the intra-abdominal fat. Prominent generalized edema of the subcutaneous fat, confluent in the flanks. No free intra-abdominal air. Musculoskeletal: Degenerative change in the lumbar spine. There are no acute or suspicious osseous abnormalities. IMPRESSION: 1. Multifocal geographic patchy ground-glass opacities throughout both lungs, consistent with COVID-19 pneumonia. 2. Fluid overload. Moderate right and small left pleural effusion. Small to moderate volume abdominopelvic ascites. Generalized subcutaneous edema of the chest and abdomen consistent with third-spacing, confluent edema in the flanks. 3. Question of nodular hepatic contours, recommend correlation for cirrhosis. 4. Chronic left renal atrophy. Unchanged 7 mm calcification in the left pelvis equivocally within the ureter. No hydronephrosis. Aortic Atherosclerosis (ICD10-I70.0). Electronically Signed   By: Keith Rake M.D.   On: 11/07/2019 23:00   CT CHEST WO CONTRAST  Result Date: 11/07/2019 CLINICAL DATA:  Generalized weakness and lethargy. Sepsis. Ascites. Cough. COVID positive yesterday. EXAM: CT CHEST, ABDOMEN AND  PELVIS WITHOUT CONTRAST TECHNIQUE: Multidetector CT imaging of the chest, abdomen and pelvis was performed following the standard protocol without IV contrast. COMPARISON:  Radiograph yesterday. Abdominopelvic CT 09/15/2019 FINDINGS: CT CHEST FINDINGS Cardiovascular: Right upper extremity PICC with tip in the SVC. Motion artifact partially obscures detailed evaluation. Normal caliber thoracic aorta. Mild atherosclerosis. Mild cardiomegaly. No pericardial effusion. Mediastinum/Nodes: Motion and lack of contrast limits assessment for adenopathy. Small mediastinal lymph nodes not definitively enlarged. Assessment for hilar adenopathy is significantly limited. Esophagus is patulous. No suspicious thyroid nodule. Lungs/Pleura: Moderate right and small left pleural effusion. Adjacent compressive atelectasis. There are multifocal geographic patchy ground-glass opacities throughout both lungs. Trachea and mainstem bronchi are grossly patent, partially motion obscured. Musculoskeletal: Generalized subcutaneous edema throughout the chest wall. Diffuse degenerative change throughout the thoracic spine. There are no acute or suspicious osseous abnormalities. CT ABDOMEN PELVIS FINDINGS Hepatobiliary: No evidence of focal hepatic abnormality on noncontrast exam. Liver is prominent size spanning 20 cm cranial caudal. Question of nodular hepatic contours. Hyperdense gallbladder contents. No calcified gallstone. Common bile duct is not well-defined. Pancreas: No pancreatic ductal dilatation. No disproportionate peripancreatic edema. Spleen: Normal in size without focal abnormality. Adrenals/Urinary Tract: Adrenal thickening without dominant nodule. Chronic left renal atrophy. Left ureter is nondilated, unchanged 7 mm calcification in the left pelvis equivocally within the ureter. Right kidney appears normal in size. No hydronephrosis or renal calculi. Urinary bladder is distended and unremarkable. Stomach/Bowel: Bowel evaluation is  limited in the absence of contrast, presence of ascites, and motion artifact. Nondistended stomach, grossly unremarkable. No small bowel obstruction. Appendix not visualized. Moderate volume of stool throughout the colon. There is mild colonic redundancy. No obvious colonic wall thickening or pericolonic inflammation. Vascular/Lymphatic: Abdominal aorta is normal in caliber. No bulky abdominopelvic adenopathy. Reproductive: Uterus is slightly bulky. Limited assessment for adnexal mass given pelvic ascites. Other: Small to moderate volume abdominopelvic ascites. There is  generalized edema of the intra-abdominal fat. Prominent generalized edema of the subcutaneous fat, confluent in the flanks. No free intra-abdominal air. Musculoskeletal: Degenerative change in the lumbar spine. There are no acute or suspicious osseous abnormalities. IMPRESSION: 1. Multifocal geographic patchy ground-glass opacities throughout both lungs, consistent with COVID-19 pneumonia. 2. Fluid overload. Moderate right and small left pleural effusion. Small to moderate volume abdominopelvic ascites. Generalized subcutaneous edema of the chest and abdomen consistent with third-spacing, confluent edema in the flanks. 3. Question of nodular hepatic contours, recommend correlation for cirrhosis. 4. Chronic left renal atrophy. Unchanged 7 mm calcification in the left pelvis equivocally within the ureter. No hydronephrosis. Aortic Atherosclerosis (ICD10-I70.0). Electronically Signed   By: Keith Rake M.D.   On: 11/07/2019 23:00   DG CHEST PORT 1 VIEW  Result Date: 11/16/2019 CLINICAL DATA:  Shortness of breath.  COVID-19 positive EXAM: PORTABLE CHEST 1 VIEW COMPARISON:  Nov 09, 2019 FINDINGS: Central catheter tip is in the superior vena cava. No pneumothorax. Airspace opacity is noted bilaterally, more extensive on the right than on the left. There has been slight interval clearing on the left without appreciable change on the right compared to  most recent study. This opacity is primarily in a perihilar distribution bilaterally. There is a small layering right pleural effusion. There is cardiomegaly with pulmonary venous hypertension. No adenopathy. No bone lesions. IMPRESSION: Cardiomegaly with pulmonary vascular congestion. Airspace opacity is noted bilaterally, more on the right than on the left with partial clearing from the left upper lobe and left base compared to previous study. No change on the right. Small right pleural effusion. Suspect multifocal pneumonia, although there may well be superimposed pulmonary edema. Question both pneumonia and congestive heart failure concurrent. Electronically Signed   By: Lowella Grip III M.D.   On: 11/16/2019 09:49   DG Chest Port 1 View  Result Date: 11/09/2019 CLINICAL DATA:  Weakness, COVID-19 positive EXAM: PORTABLE CHEST 1 VIEW COMPARISON:  Chest radiograph from one day prior. FINDINGS: Right PICC terminates in the lower third of the SVC. Stable cardiomediastinal silhouette with mild cardiomegaly. No pneumothorax. No pleural effusion. Extensive patchy opacities throughout both lungs, slightly worsened in the upper lungs. IMPRESSION: Extensive patchy opacities throughout both lungs, slightly worsened in the upper lungs, compatible with COVID-19 pneumonia, although with component of pulmonary edema not excluded. Mild cardiomegaly. Electronically Signed   By: Ilona Sorrel M.D.   On: 11/09/2019 08:19   DG Chest Port 1 View  Result Date: 11/08/2019 CLINICAL DATA:  Shortness of breath, COVID-19, diabetes mellitus, hypertension, stage III chronic kidney disease, history endometrial cancer EXAM: PORTABLE CHEST 1 VIEW COMPARISON:  Portable exam 0816 hours compared to 11/06/2019 FINDINGS: RIGHT arm PICC line tip projects over SVC. Enlargement of cardiac silhouette with stable mediastinal contours. Extensive BILATERAL pulmonary infiltrates greatest perihilar on RIGHT, question asymmetric pulmonary edema  versus multifocal infection. No pleural effusion or pneumothorax. Bones demineralized with scattered degenerative changes of the thoracic spine. IMPRESSION: Enlargement of cardiac silhouette with increased BILATERAL pulmonary infiltrates greatest RIGHT perihilar question asymmetric pulmonary edema versus multifocal pneumonia. Electronically Signed   By: Lavonia Dana M.D.   On: 11/08/2019 08:51   DG CHEST PORT 1 VIEW  Result Date: 11/06/2019 CLINICAL DATA:  Weakness. EXAM: PORTABLE CHEST 1 VIEW COMPARISON:  November 05, 2019. FINDINGS: Stable cardiomegaly with possible central pulmonary vascular congestion. No pneumothorax is noted. Mild bibasilar atelectasis is noted with small pleural effusions. Bony thorax is unremarkable. IMPRESSION: Stable cardiomegaly with possible central pulmonary vascular  congestion. Mild bibasilar subsegmental atelectasis is noted with small pleural effusions. Electronically Signed   By: Marijo Conception M.D.   On: 11/06/2019 15:57   DG Chest Port 1 View  Result Date: 11/05/2019 CLINICAL DATA:  Weakness. Found on floor today. EXAM: PORTABLE CHEST 1 VIEW COMPARISON:  10/18/2019 FINDINGS: Stable heart size and mediastinal contours with mild cardiomegaly. Hazy opacity at both lung bases likely combination of pleural fluid and airspace disease/atelectasis. Vascular congestion. No pneumothorax. Left proximal humerus fracture appears subacute with surrounding heterotopic ossification, also seen on 09/17/2019 shoulder radiograph. IMPRESSION: 1. Hazy opacity at both lung bases likely combination of pleural fluid and airspace disease/atelectasis. This is stable on the right but progressive on the left from prior. 2. Stable mild cardiomegaly.  Vascular congestion. Electronically Signed   By: Keith Rake M.D.   On: 11/05/2019 17:02   DG Chest Portable 1 View  Result Date: 10/18/2019 CLINICAL DATA:  Hypertension and shortness of breath EXAM: PORTABLE CHEST 1 VIEW COMPARISON:  September 17, 2019  FINDINGS: Ill-defined opacity in the right mid and lower lung regions noted with suspected small superimposed pleural effusion. There is mild left base atelectasis. Left lung otherwise clear. Heart is mildly enlarged with pulmonary vascularity normal. No adenopathy. There is degenerative change in the thoracic spine. IMPRESSION: Ill-defined opacity right mid and lower lung zones, likely due to a degree of pneumonia with superimposed small pleural effusion. Mild left base atelectasis. Stable cardiac prominence. No adenopathy evident. Electronically Signed   By: Lowella Grip III M.D.   On: 10/18/2019 14:06   VAS [place] LOWER EXTREMITY VENOUS (DVT)  Result Date: 11/07/2019  Lower Venous DVTStudy Indications: Swelling, and Edema.  Limitations: Body habitus and poor ultrasound/tissue interface. Comparison Study: 09/16/19 previous Performing Technologist: Abram Sander RVS  Examination Guidelines: A complete evaluation includes B-mode imaging, spectral Doppler, color Doppler, and power Doppler as needed of all accessible portions of each vessel. Bilateral testing is considered an integral part of a complete examination. Limited examinations for reoccurring indications may be performed as noted. The reflux portion of the exam is performed with the patient in reverse Trendelenburg.  +---------+---------------+---------+-----------+----------+--------------+ RIGHT    CompressibilityPhasicitySpontaneityPropertiesThrombus Aging +---------+---------------+---------+-----------+----------+--------------+ CFV      Full           Yes      Yes                                 +---------+---------------+---------+-----------+----------+--------------+ SFJ      Full                                                        +---------+---------------+---------+-----------+----------+--------------+ FV Prox  Full                                                         +---------+---------------+---------+-----------+----------+--------------+ FV Mid  Not visualized +---------+---------------+---------+-----------+----------+--------------+ FV Distal               Yes      Yes                                 +---------+---------------+---------+-----------+----------+--------------+ PFV      Full                                                        +---------+---------------+---------+-----------+----------+--------------+ POP      Full           Yes      Yes                                 +---------+---------------+---------+-----------+----------+--------------+ PTV                                                   Not visualized +---------+---------------+---------+-----------+----------+--------------+ PERO                                                  Not visualized +---------+---------------+---------+-----------+----------+--------------+   +---------+---------------+---------+-----------+----------+--------------+ LEFT     CompressibilityPhasicitySpontaneityPropertiesThrombus Aging +---------+---------------+---------+-----------+----------+--------------+ CFV      Full           Yes      Yes                                 +---------+---------------+---------+-----------+----------+--------------+ SFJ      Full                                                        +---------+---------------+---------+-----------+----------+--------------+ FV Prox  Full                                                        +---------+---------------+---------+-----------+----------+--------------+ FV Mid                  Yes      Yes                                 +---------+---------------+---------+-----------+----------+--------------+ FV Distal               Yes      Yes                                  +---------+---------------+---------+-----------+----------+--------------+ PFV      Full                                                        +---------+---------------+---------+-----------+----------+--------------+  POP      Full           Yes      Yes                                 +---------+---------------+---------+-----------+----------+--------------+ PTV      Full                                                        +---------+---------------+---------+-----------+----------+--------------+ PERO                                                  Not visualized +---------+---------------+---------+-----------+----------+--------------+     Summary: BILATERAL: - No evidence of deep vein thrombosis seen in the lower extremities, bilaterally.   *See table(s) above for measurements and observations. Electronically signed by Curt Jews MD on 11/07/2019 at 4:26:57 PM.    Final    [place] EKG SITE RITE  Result Date: 11/07/2019 If Site Rite image not attached, placement could not be confirmed due to current cardiac rhythm.    Phillips Climes M.D on 11/16/2019 at 1:59 PM  To page go to [email or web address] - password [facility]

## 2019-11-16 NOTE — Plan of Care (Signed)
  Problem: Education: Goal: Knowledge of General Education information will improve Description: Including pain rating scale, medication(s)/side effects and non-pharmacologic comfort measures Outcome: Progressing   Problem: Health Behavior/Discharge Planning: Goal: Ability to manage health-related needs will improve Outcome: Progressing  Bair Hugger reapplied as temp decreased from 35.6 to 35.3.   Problem: Activity: Goal: Risk for activity intolerance will decrease Outcome: Progressing  Patient able to turn in bed with staff assist.  Problem: Pain Managment: Goal: General experience of comfort will improve Outcome: Progressing  Patient denies pain or SOB at this time.

## 2019-11-17 LAB — CBC
HCT: 28.8 % — ABNORMAL LOW (ref 36.0–46.0)
Hemoglobin: 8.4 g/dL — ABNORMAL LOW (ref 12.0–15.0)
MCH: 28.7 pg (ref 26.0–34.0)
MCHC: 29.2 g/dL — ABNORMAL LOW (ref 30.0–36.0)
MCV: 98.3 fL (ref 80.0–100.0)
Platelets: 135 10*3/uL — ABNORMAL LOW (ref 150–400)
RBC: 2.93 MIL/uL — ABNORMAL LOW (ref 3.87–5.11)
RDW: 18.7 % — ABNORMAL HIGH (ref 11.5–15.5)
WBC: 6.3 10*3/uL (ref 4.0–10.5)
nRBC: 0.3 % — ABNORMAL HIGH (ref 0.0–0.2)

## 2019-11-17 LAB — COMPREHENSIVE METABOLIC PANEL
ALT: 6 U/L (ref 0–44)
AST: 10 U/L — ABNORMAL LOW (ref 15–41)
Albumin: 3.2 g/dL — ABNORMAL LOW (ref 3.5–5.0)
Alkaline Phosphatase: 79 U/L (ref 38–126)
Anion gap: 11 (ref 5–15)
BUN: 44 mg/dL — ABNORMAL HIGH (ref 8–23)
CO2: 29 mmol/L (ref 22–32)
Calcium: 8.9 mg/dL (ref 8.9–10.3)
Chloride: 113 mmol/L — ABNORMAL HIGH (ref 98–111)
Creatinine, Ser: 1.63 mg/dL — ABNORMAL HIGH (ref 0.44–1.00)
GFR calc Af Amer: 38 mL/min — ABNORMAL LOW (ref >60)
GFR calc non Af Amer: 33 mL/min — ABNORMAL LOW (ref >60)
Glucose, Bld: 125 mg/dL — ABNORMAL HIGH (ref 70–99)
Potassium: 3.6 mmol/L (ref 3.5–5.1)
Sodium: 153 mmol/L — ABNORMAL HIGH (ref 135–145)
Total Bilirubin: 1 mg/dL (ref 0.3–1.2)
Total Protein: 6.2 g/dL — ABNORMAL LOW (ref 6.5–8.1)

## 2019-11-17 LAB — GLUCOSE, CAPILLARY
Glucose-Capillary: 116 mg/dL — ABNORMAL HIGH (ref 70–99)
Glucose-Capillary: 160 mg/dL — ABNORMAL HIGH (ref 70–99)
Glucose-Capillary: 120 mg/dL — ABNORMAL HIGH (ref 70–99)
Glucose-Capillary: 146 mg/dL — ABNORMAL HIGH (ref 70–99)

## 2019-11-17 MED ORDER — SPIRONOLACTONE 25 MG PO TABS
25.0000 mg | ORAL_TABLET | Freq: Every day | ORAL | Status: DC
  Administered 2019-11-17 – 2019-11-20 (×4): 25 mg via ORAL
  Filled 2019-11-17 (×4): qty 1

## 2019-11-17 NOTE — Progress Notes (Signed)
PROGRESS NOTE                                                                                                                                                                                                             Patient Demographics:    Vanessa [name], is a 64 y.o. female, DOB - 1955/11/26, HFW:[id number]  Admit date - 11/05/2019   Admitting Physician Mckinley Jewel, MD  Outpatient Primary MD for the patient is Maren Reamer, MD (Inactive)  LOS - 11  CC - fever     Brief Narrative    -Vanessa [name] is a 64 y.o. female with medical history significant of hypertension, diabetes, hyperlipidemia, grade 2 diastolic congestive heart failure, CKD stage IIIb, endometrial cancer presents to emergency department due to generalized weakness and lethargy, she was diagnosed with COVID-19 pneumonia with sepsis and hypothermia and admitted to the hospital.    She was recently hospitalized for MSSA bacteremia few weeks ago.  She also carries a history of endometrial cancer under the care of Dr. Berline Lopes GYN oncology deemed a poor surgical candidate for now, also recent GI bleed due to duodenal ulcer.  She has completely recovered from COVID-19 pneumonia her only issue now is persistent hypothermia of unclear etiology, stable TSH and cortisol, appears completely nontoxic, blood cultures negative stable procalcitonin.  Continue providing supportive care.   Subjective:   Patient in bed, appears comfortable, denies any headache, he remains with poor appetite as discussed with staff, no requirement for warming blanket overnight.   Assessment  & Plan :     Sepsis due COVID 19 infection -  Etiology not entirely clear, will to be most likely due to Covid, she is hypothermic without leukocytosis, does have COVID-19 infection and mild pneumonia this admission , she recently had MSSA bacteremia few weeks ago. - At this point repeat blood cultures were ordered and stayed negative, stable  procalcitonin, cefazolin has been stopped, she has completed her remdesivir and steroid course for COVID-19 pneumonia, CT chest abdomen pelvis nonacute.  Continue supportive care. -Reports she received the first dose of Covid vaccine last March, she did not receive the second vaccine shot yet.  Hypothermia.   -So far no clear etiology despite extensive work-up, so far there is no evidence of any infectious process. -  currently with stable procalcitonin, TSH, free T4 and cortisol level and negative blood cultures. - could be central in origin due to reset thermostat, labs including TSH TSH cortisol stable, appears  nontoxic, negative blood cultures, stable procalcitonin, completely symptom-free, supportive care with bear hugger, increase room temperature and monitor. -Her medication regimen has been adjusted recently, I have stopped her beta-blocker as case reports of its causing hypothermia, as well I have decreased her hydralazine to see if it helps with her hypothermia, given its vasodilatory effect. -Recurrent hypothermia requiring intermittent warming blanket, so I have instructed Foley with thermometer for more accurate constant temperature reads , overall her temperature on the lower side at 95 persistently, but she did not require any warming blanket, and this appears to be better than rectal or axillary. -Malnutrition may be contributing to this, so she was encouraged to increase her Ensure intake and to increase her oral intake as well.  Hypernatremia -Overall patient is volume overloaded, but this is most likely due to third spacing, she is intravascularly volume depleted. -Lasix has been stopped given her hyponatremia, I will try on Aldactone been some concern of cirrhosis. -Continue with IV albumin as it seems as to be helping with edema and hypothermia. -Ultrasound showing some liver nodularity concerning for cirrhosis. -Continue with D5W at the current rate, she was encouraged to  increase her fluid intake.  Endometrial cancer with intermittent bleeding and spotting.   - Stable H&H, will type screen, outpatient follow-up with OB oncologist Dr. Berline Lopes once discharged. -Reviewing Dr. Berline Lopes notes, patient plan to be treated with IUD, but she needs to finish 7 days of Flagyl for trichomonas infection, started on Flagyl, to finish total of 7 days.  History of duodenal ulcer with GI bleed.   -She is on PPI, have increased to twice daily, especially as she is being started on DVT prophylaxis dose .  Borderline elevated D-dimer.   - Could be due to her endometrial bleed history, negative lower extremity ultrasound. Tough situation due to recent history of upper GI bleed with ongoing intermittent bleed from her vagina.  But she is not much ambulatory, with COVID-19, elevated D-dimers and malignancy which puts her at significant hypercoagulable state and risk, given her GI bleed more than 4 weeks ago, and her hemoglobin has been stable, I did start her on subcu heparin for DVT prophylaxis, and increased her Protonix to twice daily.  Hypertension.   -Blood pressure overall is elevated, her regimen has been changed multiple times during recent admissions given its hard to control . -For now I will continue with Norvasc, doxazosin, I have stopped her Coreg and decrease hydralazine to see if this helps with hypothermia please see discussion below , blood pressure remains significantly elevated, so I will increase her clonidine today .  Intravascular dehydration with hypernatremia.  -Currently appears to be volume overloaded.   Leg edema mostly nonpitting.  -Started on IV diuresis, but it has been stopped given worsening hypernatremia -Initial imaging showing some evidence of cirrhosis, ammonia level, and give some IV albumin for albumin level of 2.2  MIcrocytic iron deficiency anemia.  On oral iron supplementation likely due to ongoing intermittent vaginal blood loss, outpatient  follow-up with PCP and GYN oncology.  Hypokalemia  -Replete  as needed   DM type II.  ISS.  Follow closely blood sugars as she is now on D5W and Decadron.  May require Lantus and adjustment of short-acting insulin.  Lab Results  Component Value Date   HGBA1C 5.7 (H) 09/15/2019   CBG (last 3)  Recent Labs    11/16/19 2116 11/17/19 0757 11/17/19 1204  GLUCAP 98 116* 160*      Condition.  Extremely guarded.   Family Communication  :  husband Signer [phone] on 5/7.  Code Status :  Full  Disposition Plan  :    Status is: Inpatient  Remains inpatient appropriate because:Hemodynamically unstable, remains with significant hypothermia   Dispo: The patient is from: SNF              Anticipated d/c is to: SNF              Anticipated d/c date is: > 3 days              Patient currently is not medically stable to d/c.   Consults  :  None  Procedures  :    DVT Prophylaxis  :  [place] Heparin     Lab Results  Component Value Date   PLT 135 (L) 11/17/2019    Diet :  Diet Order            DIET DYS 3 Room service appropriate? Yes; Fluid consistency: Thin  Diet effective now               Inpatient Medications Scheduled Meds: . albuterol  2 puff Inhalation Q4H  . amLODipine  10 mg Oral Daily  . vitamin C  500 mg Oral Daily  . Chlorhexidine Gluconate Cloth  6 each Topical Daily  . cloNIDine  0.2 mg Oral BID  . doxazosin  4 mg Oral Daily  . feeding supplement (ENSURE ENLIVE)  237 mL Oral TID BM  . feeding supplement (PRO-STAT SUGAR FREE 64)  30 mL Oral TID WC  . ferrous sulfate  325 mg Oral TID WC  . heparin injection (subcutaneous)  5,000 Units Subcutaneous Q8H  . hydrALAZINE  50 mg Oral Q8H  . hydrochlorothiazide  12.5 mg Oral Daily  . insulin aspart  0-15 Units Subcutaneous TID WC  . insulin aspart  0-5 Units Subcutaneous QHS  . mouth rinse  15 mL Mouth Rinse BID  . metroNIDAZOLE  500 mg Oral Q8H  . pantoprazole sodium  40 mg Oral BID  . polyethylene  glycol  17 g Oral Daily  . spironolactone  25 mg Oral Daily  . zinc sulfate  220 mg Oral Daily   Continuous Infusions: . dextrose 50 mL/hr at 11/17/19 1304   PRN Meds:.acetaminophen, capsaicin, guaiFENesin-dextromethorphan, loratadine, [DISCONTINUED] ondansetron **OR** ondansetron (ZOFRAN) IV, sodium chloride flush  Antibiotics  :   Anti-infectives (From admission, onward)   Start     Dose/Rate Route Frequency Ordered Stop   11/15/19 1600  metroNIDAZOLE (FLAGYL) tablet 500 mg     500 mg Oral Every 8 hours 11/15/19 1520     11/11/19 1000  doxycycline (VIBRA-TABS) tablet 100 mg  Status:  Discontinued     100 mg Oral Every 12 hours 11/11/19 0729 11/12/19 0921   11/09/19 1230  vancomycin (VANCOREADY) IVPB 1250 mg/250 mL  Status:  Discontinued     1,250 mg 166.7 mL/hr over 90 Minutes Intravenous Every 48 hours 11/07/19 1100 11/09/19 1318   11/08/19 1000  remdesivir 100 mg in sodium chloride 0.9 % 100 mL IVPB     100 mg 200 mL/hr over 30 Minutes Intravenous Daily 11/07/19 1217 11/11/19 1054   11/07/19 1600  piperacillin-tazobactam (ZOSYN) IVPB 3.375 g  Status:  Discontinued     3.375 g 12.5 mL/hr over 240 Minutes Intravenous Every 8 hours 11/07/19 1033 11/11/19 0729   11/07/19 1245  remdesivir 200 mg in sodium chloride 0.9% 250 mL IVPB  200 mg 580 mL/hr over 30 Minutes Intravenous Once 11/07/19 1216 11/07/19 1728   11/07/19 1130  vancomycin (VANCOREADY) IVPB 2000 mg/400 mL     2,000 mg 200 mL/hr over 120 Minutes Intravenous  Once 11/07/19 1053 11/07/19 1432   11/07/19 1100  piperacillin-tazobactam (ZOSYN) IVPB 3.375 g     3.375 g 100 mL/hr over 30 Minutes Intravenous  Once 11/07/19 1033 11/07/19 1303          Objective:   Vitals:   11/16/19 2000 11/17/19 0006 11/17/19 0400 11/17/19 0415  BP:  (!) 149/73  (!) 170/74  Pulse:  (!) 59  67  Resp: 20 17  20   Temp:  (!) 95.4 F (35.2 C)  (!) 95.7 F (35.4 C)  TempSrc: Bladder Bladder Axillary Axillary  SpO2:  93%  (!) 89%    Weight:    95.8 kg  Height:        SpO2: (!) 89 % O2 Flow Rate (L/min): 1 L/min  Wt Readings from Last 3 Encounters:  11/17/19 95.8 kg  10/18/19 100.3 kg  10/17/19 94.9 kg     Intake/Output Summary (Last 24 hours) at 11/17/2019 1453 Last data filed at 11/16/2019 1900 Gross per 24 hour  Intake 451.67 ml  Output 925 ml  Net -473.33 ml     Physical Exam  Awake Alert, Oriented X 3, but mentation continues to fluctuate frequently, poor dentition  Symmetrical Chest wall movement, Good air movement bilaterally, CTAB RRR,No Gallops,Rubs or new Murmurs, No Parasternal Heave +ve B.Sounds, Abd Soft, No tenderness, No rebound - guarding or rigidity. No Cyanosis, Clubbing , he remains with significant generalized edema., No new Rash or bruise      Data Review:    Recent Labs  Lab 11/11/19 0338 11/11/19 0338 11/12/19 0519 11/12/19 0519 11/13/19 0547 11/14/19 0259 11/15/19 0206 11/16/19 0330 11/17/19 0424  WBC 7.3   < > 11.1*   < > 8.0 5.0 6.0 6.9 6.3  HGB 9.4*   < > 10.2*   < > 9.5* 8.9* 8.7* 8.1* 8.4*  HCT 31.2*   < > 33.6*   < > 31.4* 29.5* 29.5* 28.0* 28.8*  PLT 145*   < > 147*   < > 129* 101* 120* 128* 135*  MCV 97.8   < > 96.6   < > 96.0 95.8 96.7 98.2 98.3  MCH 29.5   < > 29.3   < > 29.1 28.9 28.5 28.4 28.7  MCHC 30.1   < > 30.4   < > 30.3 30.2 29.5* 28.9* 29.2*  RDW 18.3*   < > 18.3*   < > 18.1* 18.2* 18.6* 18.9* 18.7*  LYMPHSABS 0.3*  --  0.3*  --  0.4* 0.4*  --   --   --   MONOABS 0.2  --  0.4  --  0.4 0.2  --   --   --   EOSABS 0.0  --  0.0  --  0.0 0.0  --   --   --   BASOSABS 0.0  --  0.0  --  0.0 0.0  --   --   --    < > = values in this interval not displayed.    Recent Labs  Lab 11/11/19 0338 11/11/19 0338 11/12/19 0519 11/12/19 0519 11/13/19 0547 11/13/19 1730 11/14/19 0259 11/15/19 0206 11/15/19 2110 11/16/19 0330 11/17/19 0424  NA 148*   < > 146*   < > 146*  --  147* 150*  --  153* 153*  K 3.5   < > 3.1*   < > 3.0*  --  3.7 3.8  --  3.7 3.6   CL 110   < > 108   < > 110  --  113* 115*  --  114* 113*  CO2 25   < > 25   < > 25  --  25 28  --  29 29  GLUCOSE 171*   < > 130*   < > 126*  --  138* 148*  --  119* 125*  BUN 43*   < > 42*   < > 44*  --  48* 47*  --  48* 44*  CREATININE 1.97*   < > 1.79*   < > 1.77*  --  1.67* 1.68*  --  1.71* 1.63*  CALCIUM 8.2*   < > 8.3*   < > 8.3*  --  8.0* 8.1*  --  8.6* 8.9  AST 17   < > 16   < > 13*  --  12* 11*  --  10* 10*  ALT 5   < > 6   < > 6  --  6 6  --  6 6  ALKPHOS 90   < > 89   < > 98  --  95 93  --  87 79  BILITOT 0.7   < > 1.0   < > 0.8  --  0.8 0.8  --  0.8 1.0  ALBUMIN 2.4*   < > 2.5*   < > 2.2*  --  2.2* 2.2*  --  2.5* 3.2*  MG 1.9  --  1.7  --  1.8  --  1.8  --   --   --   --   CRP 5.5*  --  4.4*  --  9.1*  --  8.8*  --   --   --   --   DDIMER 2.47*  --  2.95*  --  2.39*  --  1.85*  --   --   --   --   PROCALCITON  --   --   --   --   --  0.25  --   --   --   --   --   TSH  --   --   --   --   --  4.135  --   --   --   --   --   AMMONIA  --   --   --   --   --   --   --   --  22  --   --    < > = values in this interval not displayed.    Recent Labs  Lab 11/11/19 0338 11/12/19 0519 11/13/19 0547 11/13/19 1730 11/14/19 0259  CRP 5.5* 4.4* 9.1*  --  8.8*  DDIMER 2.47* 2.95* 2.39*  --  1.85*  PROCALCITON  --   --   --  0.25  --     ------------------------------------------------------------------------------------------------------------------ No results for input(s): CHOL, HDL, LDLCALC, TRIG, CHOLHDL, LDLDIRECT in the last 72 hours.  Lab Results  Component Value Date   HGBA1C 5.7 (H) 09/15/2019   ------------------------------------------------------------------------------------------------------------------ No results for input(s): TSH, T4TOTAL, T3FREE, THYROIDAB in the last 72 hours.  Invalid input(s): FREET3 ------------------------------------------------------------------------------------------------------------------ No results for input(s): VITAMINB12,  FOLATE, FERRITIN, TIBC, IRON, RETICCTPCT in the last 72 hours.  Coagulation profile No results for input(s): INR, PROTIME in the  last 168 hours.  No results for input(s): DDIMER in the last 72 hours.  Cardiac Enzymes No results for input(s): CKMB, TROPONINI, MYOGLOBIN in the last 168 hours.  Invalid input(s): CK ------------------------------------------------------------------------------------------------------------------    Component Value Date/Time   BNP 1,043.9 (H) 11/09/2019 0430    Micro Results Recent Results (from the past 240 hour(s))  Culture, blood (routine x 2)     Status: None (Preliminary result)   Collection Time: 11/13/19  6:09 PM   Specimen: BLOOD  Result Value Ref Range Status   Specimen Description BLOOD LEFT ANTECUBITAL  Final   Special Requests   Final    BOTTLES DRAWN AEROBIC AND ANAEROBIC Blood Culture adequate volume   Culture   Final    NO GROWTH 4 DAYS Performed at [facility], [address] [place], [place] [address]    Report Status PENDING  Incomplete  Culture, blood (routine x 2)     Status: None (Preliminary result)   Collection Time: 11/13/19  6:16 PM   Specimen: BLOOD LEFT HAND  Result Value Ref Range Status   Specimen Description BLOOD LEFT HAND  Final   Special Requests   Final    BOTTLES DRAWN AEROBIC AND ANAEROBIC Blood Culture adequate volume   Culture   Final    NO GROWTH 4 DAYS Performed at [facility], [place] [address], [place], [place] [address]    Report Status PENDING  Incomplete    Radiology Reports CT ABDOMEN PELVIS WO CONTRAST  Result Date: 11/07/2019 CLINICAL DATA:  Generalized weakness and lethargy. Sepsis. Ascites. Cough. COVID positive yesterday. EXAM: CT CHEST, ABDOMEN AND PELVIS WITHOUT CONTRAST TECHNIQUE: Multidetector CT imaging of the chest, abdomen and pelvis was performed following the standard protocol without IV contrast. COMPARISON:  Radiograph yesterday. Abdominopelvic CT 09/15/2019 FINDINGS:  CT CHEST FINDINGS Cardiovascular: Right upper extremity PICC with tip in the SVC. Motion artifact partially obscures detailed evaluation. Normal caliber thoracic aorta. Mild atherosclerosis. Mild cardiomegaly. No pericardial effusion. Mediastinum/Nodes: Motion and lack of contrast limits assessment for adenopathy. Small mediastinal lymph nodes not definitively enlarged. Assessment for hilar adenopathy is significantly limited. Esophagus is patulous. No suspicious thyroid nodule. Lungs/Pleura: Moderate right and small left pleural effusion. Adjacent compressive atelectasis. There are multifocal geographic patchy ground-glass opacities throughout both lungs. Trachea and mainstem bronchi are grossly patent, partially motion obscured. Musculoskeletal: Generalized subcutaneous edema throughout the chest wall. Diffuse degenerative change throughout the thoracic spine. There are no acute or suspicious osseous abnormalities. CT ABDOMEN PELVIS FINDINGS Hepatobiliary: No evidence of focal hepatic abnormality on noncontrast exam. Liver is prominent size spanning 20 cm cranial caudal. Question of nodular hepatic contours. Hyperdense gallbladder contents. No calcified gallstone. Common bile duct is not well-defined. Pancreas: No pancreatic ductal dilatation. No disproportionate peripancreatic edema. Spleen: Normal in size without focal abnormality. Adrenals/Urinary Tract: Adrenal thickening without dominant nodule. Chronic left renal atrophy. Left ureter is nondilated, unchanged 7 mm calcification in the left pelvis equivocally within the ureter. Right kidney appears normal in size. No hydronephrosis or renal calculi. Urinary bladder is distended and unremarkable. Stomach/Bowel: Bowel evaluation is limited in the absence of contrast, presence of ascites, and motion artifact. Nondistended stomach, grossly unremarkable. No small bowel obstruction. Appendix not visualized. Moderate volume of stool throughout the colon. There is  mild colonic redundancy. No obvious colonic wall thickening or pericolonic inflammation. Vascular/Lymphatic: Abdominal aorta is normal in caliber. No bulky abdominopelvic adenopathy. Reproductive: Uterus is slightly bulky. Limited assessment for adnexal mass given pelvic ascites. Other:  Small to moderate volume abdominopelvic ascites. There is generalized edema of the intra-abdominal fat. Prominent generalized edema of the subcutaneous fat, confluent in the flanks. No free intra-abdominal air. Musculoskeletal: Degenerative change in the lumbar spine. There are no acute or suspicious osseous abnormalities. IMPRESSION: 1. Multifocal geographic patchy ground-glass opacities throughout both lungs, consistent with COVID-19 pneumonia. 2. Fluid overload. Moderate right and small left pleural effusion. Small to moderate volume abdominopelvic ascites. Generalized subcutaneous edema of the chest and abdomen consistent with third-spacing, confluent edema in the flanks. 3. Question of nodular hepatic contours, recommend correlation for cirrhosis. 4. Chronic left renal atrophy. Unchanged 7 mm calcification in the left pelvis equivocally within the ureter. No hydronephrosis. Aortic Atherosclerosis (ICD10-I70.0). Electronically Signed   By: Keith Rake M.D.   On: 11/07/2019 23:00   CT CHEST WO CONTRAST  Result Date: 11/07/2019 CLINICAL DATA:  Generalized weakness and lethargy. Sepsis. Ascites. Cough. COVID positive yesterday. EXAM: CT CHEST, ABDOMEN AND PELVIS WITHOUT CONTRAST TECHNIQUE: Multidetector CT imaging of the chest, abdomen and pelvis was performed following the standard protocol without IV contrast. COMPARISON:  Radiograph yesterday. Abdominopelvic CT 09/15/2019 FINDINGS: CT CHEST FINDINGS Cardiovascular: Right upper extremity PICC with tip in the SVC. Motion artifact partially obscures detailed evaluation. Normal caliber thoracic aorta. Mild atherosclerosis. Mild cardiomegaly. No pericardial effusion.  Mediastinum/Nodes: Motion and lack of contrast limits assessment for adenopathy. Small mediastinal lymph nodes not definitively enlarged. Assessment for hilar adenopathy is significantly limited. Esophagus is patulous. No suspicious thyroid nodule. Lungs/Pleura: Moderate right and small left pleural effusion. Adjacent compressive atelectasis. There are multifocal geographic patchy ground-glass opacities throughout both lungs. Trachea and mainstem bronchi are grossly patent, partially motion obscured. Musculoskeletal: Generalized subcutaneous edema throughout the chest wall. Diffuse degenerative change throughout the thoracic spine. There are no acute or suspicious osseous abnormalities. CT ABDOMEN PELVIS FINDINGS Hepatobiliary: No evidence of focal hepatic abnormality on noncontrast exam. Liver is prominent size spanning 20 cm cranial caudal. Question of nodular hepatic contours. Hyperdense gallbladder contents. No calcified gallstone. Common bile duct is not well-defined. Pancreas: No pancreatic ductal dilatation. No disproportionate peripancreatic edema. Spleen: Normal in size without focal abnormality. Adrenals/Urinary Tract: Adrenal thickening without dominant nodule. Chronic left renal atrophy. Left ureter is nondilated, unchanged 7 mm calcification in the left pelvis equivocally within the ureter. Right kidney appears normal in size. No hydronephrosis or renal calculi. Urinary bladder is distended and unremarkable. Stomach/Bowel: Bowel evaluation is limited in the absence of contrast, presence of ascites, and motion artifact. Nondistended stomach, grossly unremarkable. No small bowel obstruction. Appendix not visualized. Moderate volume of stool throughout the colon. There is mild colonic redundancy. No obvious colonic wall thickening or pericolonic inflammation. Vascular/Lymphatic: Abdominal aorta is normal in caliber. No bulky abdominopelvic adenopathy. Reproductive: Uterus is slightly bulky. Limited  assessment for adnexal mass given pelvic ascites. Other: Small to moderate volume abdominopelvic ascites. There is generalized edema of the intra-abdominal fat. Prominent generalized edema of the subcutaneous fat, confluent in the flanks. No free intra-abdominal air. Musculoskeletal: Degenerative change in the lumbar spine. There are no acute or suspicious osseous abnormalities. IMPRESSION: 1. Multifocal geographic patchy ground-glass opacities throughout both lungs, consistent with COVID-19 pneumonia. 2. Fluid overload. Moderate right and small left pleural effusion. Small to moderate volume abdominopelvic ascites. Generalized subcutaneous edema of the chest and abdomen consistent with third-spacing, confluent edema in the flanks. 3. Question of nodular hepatic contours, recommend correlation for cirrhosis. 4. Chronic left renal atrophy. Unchanged 7 mm calcification in the left pelvis equivocally within the ureter.  No hydronephrosis. Aortic Atherosclerosis (ICD10-I70.0). Electronically Signed   By: Keith Rake M.D.   On: 11/07/2019 23:00   DG CHEST PORT 1 VIEW  Result Date: 11/16/2019 CLINICAL DATA:  Shortness of breath.  COVID-19 positive EXAM: PORTABLE CHEST 1 VIEW COMPARISON:  Nov 09, 2019 FINDINGS: Central catheter tip is in the superior vena cava. No pneumothorax. Airspace opacity is noted bilaterally, more extensive on the right than on the left. There has been slight interval clearing on the left without appreciable change on the right compared to most recent study. This opacity is primarily in a perihilar distribution bilaterally. There is a small layering right pleural effusion. There is cardiomegaly with pulmonary venous hypertension. No adenopathy. No bone lesions. IMPRESSION: Cardiomegaly with pulmonary vascular congestion. Airspace opacity is noted bilaterally, more on the right than on the left with partial clearing from the left upper lobe and left base compared to previous study. No change on  the right. Small right pleural effusion. Suspect multifocal pneumonia, although there may well be superimposed pulmonary edema. Question both pneumonia and congestive heart failure concurrent. Electronically Signed   By: Lowella Grip III M.D.   On: 11/16/2019 09:49   DG Chest Port 1 View  Result Date: 11/09/2019 CLINICAL DATA:  Weakness, COVID-19 positive EXAM: PORTABLE CHEST 1 VIEW COMPARISON:  Chest radiograph from one day prior. FINDINGS: Right PICC terminates in the lower third of the SVC. Stable cardiomediastinal silhouette with mild cardiomegaly. No pneumothorax. No pleural effusion. Extensive patchy opacities throughout both lungs, slightly worsened in the upper lungs. IMPRESSION: Extensive patchy opacities throughout both lungs, slightly worsened in the upper lungs, compatible with COVID-19 pneumonia, although with component of pulmonary edema not excluded. Mild cardiomegaly. Electronically Signed   By: Ilona Sorrel M.D.   On: 11/09/2019 08:19   DG Chest Port 1 View  Result Date: 11/08/2019 CLINICAL DATA:  Shortness of breath, COVID-19, diabetes mellitus, hypertension, stage III chronic kidney disease, history endometrial cancer EXAM: PORTABLE CHEST 1 VIEW COMPARISON:  Portable exam 0816 hours compared to 11/06/2019 FINDINGS: RIGHT arm PICC line tip projects over SVC. Enlargement of cardiac silhouette with stable mediastinal contours. Extensive BILATERAL pulmonary infiltrates greatest perihilar on RIGHT, question asymmetric pulmonary edema versus multifocal infection. No pleural effusion or pneumothorax. Bones demineralized with scattered degenerative changes of the thoracic spine. IMPRESSION: Enlargement of cardiac silhouette with increased BILATERAL pulmonary infiltrates greatest RIGHT perihilar question asymmetric pulmonary edema versus multifocal pneumonia. Electronically Signed   By: Lavonia Dana M.D.   On: 11/08/2019 08:51   DG CHEST PORT 1 VIEW  Result Date: 11/06/2019 CLINICAL DATA:   Weakness. EXAM: PORTABLE CHEST 1 VIEW COMPARISON:  November 05, 2019. FINDINGS: Stable cardiomegaly with possible central pulmonary vascular congestion. No pneumothorax is noted. Mild bibasilar atelectasis is noted with small pleural effusions. Bony thorax is unremarkable. IMPRESSION: Stable cardiomegaly with possible central pulmonary vascular congestion. Mild bibasilar subsegmental atelectasis is noted with small pleural effusions. Electronically Signed   By: Marijo Conception M.D.   On: 11/06/2019 15:57   DG Chest Port 1 View  Result Date: 11/05/2019 CLINICAL DATA:  Weakness. Found on floor today. EXAM: PORTABLE CHEST 1 VIEW COMPARISON:  10/18/2019 FINDINGS: Stable heart size and mediastinal contours with mild cardiomegaly. Hazy opacity at both lung bases likely combination of pleural fluid and airspace disease/atelectasis. Vascular congestion. No pneumothorax. Left proximal humerus fracture appears subacute with surrounding heterotopic ossification, also seen on 09/17/2019 shoulder radiograph. IMPRESSION: 1. Hazy opacity at both lung bases likely combination of pleural  fluid and airspace disease/atelectasis. This is stable on the right but progressive on the left from prior. 2. Stable mild cardiomegaly.  Vascular congestion. Electronically Signed   By: Keith Rake M.D.   On: 11/05/2019 17:02   VAS [place] LOWER EXTREMITY VENOUS (DVT)  Result Date: 11/07/2019  Lower Venous DVTStudy Indications: Swelling, and Edema.  Limitations: Body habitus and poor ultrasound/tissue interface. Comparison Study: 09/16/19 previous Performing Technologist: Abram Sander RVS  Examination Guidelines: A complete evaluation includes B-mode imaging, spectral Doppler, color Doppler, and power Doppler as needed of all accessible portions of each vessel. Bilateral testing is considered an integral part of a complete examination. Limited examinations for reoccurring indications may be performed as noted. The reflux portion of the exam is  performed with the patient in reverse Trendelenburg.  +---------+---------------+---------+-----------+----------+--------------+ RIGHT    CompressibilityPhasicitySpontaneityPropertiesThrombus Aging +---------+---------------+---------+-----------+----------+--------------+ CFV      Full           Yes      Yes                                 +---------+---------------+---------+-----------+----------+--------------+ SFJ      Full                                                        +---------+---------------+---------+-----------+----------+--------------+ FV Prox  Full                                                        +---------+---------------+---------+-----------+----------+--------------+ FV Mid                                                Not visualized +---------+---------------+---------+-----------+----------+--------------+ FV Distal               Yes      Yes                                 +---------+---------------+---------+-----------+----------+--------------+ PFV      Full                                                        +---------+---------------+---------+-----------+----------+--------------+ POP      Full           Yes      Yes                                 +---------+---------------+---------+-----------+----------+--------------+ PTV  Not visualized +---------+---------------+---------+-----------+----------+--------------+ PERO                                                  Not visualized +---------+---------------+---------+-----------+----------+--------------+   +---------+---------------+---------+-----------+----------+--------------+ LEFT     CompressibilityPhasicitySpontaneityPropertiesThrombus Aging +---------+---------------+---------+-----------+----------+--------------+ CFV      Full           Yes      Yes                                  +---------+---------------+---------+-----------+----------+--------------+ SFJ      Full                                                        +---------+---------------+---------+-----------+----------+--------------+ FV Prox  Full                                                        +---------+---------------+---------+-----------+----------+--------------+ FV Mid                  Yes      Yes                                 +---------+---------------+---------+-----------+----------+--------------+ FV Distal               Yes      Yes                                 +---------+---------------+---------+-----------+----------+--------------+ PFV      Full                                                        +---------+---------------+---------+-----------+----------+--------------+ POP      Full           Yes      Yes                                 +---------+---------------+---------+-----------+----------+--------------+ PTV      Full                                                        +---------+---------------+---------+-----------+----------+--------------+ PERO                                                  Not visualized +---------+---------------+---------+-----------+----------+--------------+  Summary: BILATERAL: - No evidence of deep vein thrombosis seen in the lower extremities, bilaterally.   *See table(s) above for measurements and observations. Electronically signed by Curt Jews MD on 11/07/2019 at 4:26:57 PM.    Final    [place] EKG SITE RITE  Result Date: 11/07/2019 If Site Rite image not attached, placement could not be confirmed due to current cardiac rhythm.  US Abdomen Limited RUQ  Result Date: 11/16/2019 CLINICAL DATA:  Cirrhosis EXAM: ULTRASOUND ABDOMEN LIMITED RIGHT UPPER QUADRANT COMPARISON:  11/07/2019 FINDINGS: Gallbladder: The gallbladder wall is thickened measuring approximately 8.6 mm in thickness. The  sonographic Murphy sign cannot be adequately assessed. There is gallbladder sludge. There is some mild pericholecystic free fluid. Common bile duct: Diameter: 6 mm Liver: The liver surface is nodular. There is no discrete hepatic mass. Portal vein is patent on color Doppler imaging with normal direction of blood flow towards the liver. Other: There is a small volume of ascites in the upper abdomen. There is right-sided pelviectasis. The right kidney is echogenic. IMPRESSION: 1. There is gallbladder sludge with associated gallbladder wall thickening. The sonographic Murphy sign cannot be adequately assessed. If there is high clinical suspicion for acute cholecystitis, follow-up with HIDA scan is recommended. 2. Nodular appearance of the liver which can be seen in patients with cirrhosis. There is no discrete hepatic mass. 3. Echogenic right kidney suggestive of underlying medical renal disease. 4. Right-sided pelviectasis of unknown clinical significance. This appears to be new since the patient's prior CT dated 11/07/2019 5. Small volume abdominal ascites. Electronically Signed   By: Constance Holster M.D.   On: 11/16/2019 17:14     Phillips Climes M.D on 11/17/2019 at 2:53 PM  To page go to [email or web address] - password [facility]

## 2019-11-18 LAB — CBC
HCT: 27.9 % — ABNORMAL LOW (ref 36.0–46.0)
Hemoglobin: 8.2 g/dL — ABNORMAL LOW (ref 12.0–15.0)
MCH: 29 pg (ref 26.0–34.0)
MCHC: 29.4 g/dL — ABNORMAL LOW (ref 30.0–36.0)
MCV: 98.6 fL (ref 80.0–100.0)
Platelets: 156 10*3/uL (ref 150–400)
RBC: 2.83 MIL/uL — ABNORMAL LOW (ref 3.87–5.11)
RDW: 18.9 % — ABNORMAL HIGH (ref 11.5–15.5)
WBC: 6.1 10*3/uL (ref 4.0–10.5)
nRBC: 0.3 % — ABNORMAL HIGH (ref 0.0–0.2)

## 2019-11-18 LAB — CULTURE, BLOOD (ROUTINE X 2)
Culture: NO GROWTH
Culture: NO GROWTH
Special Requests: ADEQUATE
Special Requests: ADEQUATE

## 2019-11-18 LAB — COMPREHENSIVE METABOLIC PANEL
ALT: 6 U/L (ref 0–44)
AST: 11 U/L — ABNORMAL LOW (ref 15–41)
Albumin: 3.1 g/dL — ABNORMAL LOW (ref 3.5–5.0)
Alkaline Phosphatase: 86 U/L (ref 38–126)
Anion gap: 8 (ref 5–15)
BUN: 42 mg/dL — ABNORMAL HIGH (ref 8–23)
CO2: 30 mmol/L (ref 22–32)
Calcium: 9 mg/dL (ref 8.9–10.3)
Chloride: 114 mmol/L — ABNORMAL HIGH (ref 98–111)
Creatinine, Ser: 1.7 mg/dL — ABNORMAL HIGH (ref 0.44–1.00)
GFR calc Af Amer: 36 mL/min — ABNORMAL LOW (ref >60)
GFR calc non Af Amer: 31 mL/min — ABNORMAL LOW (ref >60)
Glucose, Bld: 134 mg/dL — ABNORMAL HIGH (ref 70–99)
Potassium: 3.5 mmol/L (ref 3.5–5.1)
Sodium: 152 mmol/L — ABNORMAL HIGH (ref 135–145)
Total Bilirubin: 0.7 mg/dL (ref 0.3–1.2)
Total Protein: 5.9 g/dL — ABNORMAL LOW (ref 6.5–8.1)

## 2019-11-18 LAB — GLUCOSE, CAPILLARY
Glucose-Capillary: 148 mg/dL — ABNORMAL HIGH (ref 70–99)
Glucose-Capillary: 136 mg/dL — ABNORMAL HIGH (ref 70–99)
Glucose-Capillary: 132 mg/dL — ABNORMAL HIGH (ref 70–99)
Glucose-Capillary: 114 mg/dL — ABNORMAL HIGH (ref 70–99)

## 2019-11-18 MED ORDER — HYDRALAZINE HCL 50 MG PO TABS
100.0000 mg | ORAL_TABLET | Freq: Three times a day (TID) | ORAL | Status: DC
  Administered 2019-11-18 – 2019-11-22 (×12): 100 mg via ORAL
  Filled 2019-11-18 (×12): qty 2

## 2019-11-18 NOTE — Evaluation (Signed)
Clinical/Bedside Swallow Evaluation Patient Details  Name: Vanessa [name] MRN: [id number] Date of Birth: Oct 07, 1955  Today's Date: 11/18/2019 Time: SLP Start Time (ACUTE ONLY): 1030 SLP Stop Time (ACUTE ONLY): 1045 SLP Time Calculation (min) (ACUTE ONLY): 15 min  Past Medical History:  Past Medical History:  Diagnosis Date  . Anemia 10/09/2019  . Asthma   . CKD (chronic kidney disease) stage 3, GFR 30-59 ml/min   . Dental disease   . Diabetes mellitus without complication ([place])   . Endometrial cancer ([place])   . Esophageal dysmotility   . Heart murmur   . HLD (hyperlipidemia)   . Hypertension   . MSSA bacteremia 09/2019  . Obesity, Class III, BMI 40-49.9 (morbid obesity) ([place])   . Pressure ulcer    Past Surgical History:  Past Surgical History:  Procedure Laterality Date  . BIOPSY  10/08/2019   Procedure: BIOPSY;  Surgeon: Clarene Essex, MD;  Location: [facility] ENDOSCOPY;  Service: Endoscopy;;  . ESOPHAGOGASTRODUODENOSCOPY N/A 10/08/2019   Procedure: ESOPHAGOGASTRODUODENOSCOPY (EGD);  Surgeon: Clarene Essex, MD;  Location: [place];  Service: Endoscopy;  Laterality: N/A;  . TYMPANOSTOMY TUBE PLACEMENT Bilateral    HPI:  Vanessa [name] is a 64 y.o. female with medical history significant of hypertension, diabetes, hyperlipidemia, grade 2 diastolic congestive heart failure, CKD stage IIIb, endometrial cancer presents to emergency department due to generalized weakness and lethargy, she was diagnosed with COVID-19 pneumonia with sepsis and hypothermia.She has completely recovered from COVID-19 pneumonia her only issue now is persistent hypothermia of unclear etiology, poor oral intake. History of esophageal dysphagia.   Assessment / Plan / Recommendation Clinical Impression  Pt demonstrates poor dentition and ability to masticate. She reports she has been relying on Ensure due to this problem. She also confirms a history of esophageal issues, but states that as long as she follows her  strategies, she does ok. Pt observed to take small sips, eat only soft foods, masticates thoroughly, follow bites with sips. She also listed these precautions when questioned. THere was some delayed coughing throughout session. Will f/u for tolerance and for second observation with dys 2 (finely chopped meal) which may facilitate increased intak SLP Visit Diagnosis: Dysphagia, oropharyngeal phase (R13.12)    Aspiration Risk  Moderate aspiration risk;Risk for inadequate nutrition/hydration    Diet Recommendation Dysphagia 2 (Fine chop);Thin liquid   Liquid Administration via: Cup;Straw Medication Administration: Crushed with puree Supervision: Patient able to self feed Compensations: Slow rate;Small sips/bites;Follow solids with liquid Postural Changes: Seated upright at 90 degrees;Remain upright for at least 30 minutes after po intake    Other  Recommendations Oral Care Recommendations: Oral care BID   Follow up Recommendations 24 hour supervision/assistance      Frequency and Duration min 2x/week  1 week       Prognosis Prognosis for Safe Diet Advancement: Guarded      Swallow Study   General HPI: Vanessa [name] is a 64 y.o. female with medical history significant of hypertension, diabetes, hyperlipidemia, grade 2 diastolic congestive heart failure, CKD stage IIIb, endometrial cancer presents to emergency department due to generalized weakness and lethargy, she was diagnosed with COVID-19 pneumonia with sepsis and hypothermia.She has completely recovered from COVID-19 pneumonia her only issue now is persistent hypothermia of unclear etiology, poor oral intake. History of esophageal dysphagia. Type of Study: Bedside Swallow Evaluation Previous Swallow Assessment: none in chart Diet Prior to this Study: Dysphagia 3 (soft);Thin liquids Temperature Spikes Noted: (lowtemp) History of Recent Intubation: No Behavior/Cognition: Alert;Cooperative;Pleasant  mood Oral Cavity Assessment:  Within Functional Limits Oral Care Completed by SLP: No Oral Cavity - Dentition: Poor condition;Missing dentition(very loosebottom tooth) Vision: Functional for self-feeding Self-Feeding Abilities: Able to feed self Patient Positioning: Upright in bed Baseline Vocal Quality: Normal Volitional Cough: Strong Volitional Swallow: Able to elicit    Oral/Motor/Sensory Function Overall Oral Motor/Sensory Function: Mild impairment Facial ROM: Within Functional Limits Facial Symmetry: Abnormal symmetry left Facial Strength: Reduced left Facial Sensation: Within Functional Limits Lingual ROM: Within Functional Limits Lingual Symmetry: Within Functional Limits Lingual Strength: Within Functional Limits Lingual Sensation: Within Functional Limits   Ice Chips Ice chips: Not tested   Thin Liquid Thin Liquid: Impaired Presentation: Straw Pharyngeal  Phase Impairments: Cough - Delayed    Nectar Thick Nectar Thick Liquid: Not tested   Honey Thick Honey Thick Liquid: Not tested   Puree Puree: Impaired Pharyngeal Phase Impairments: Cough - Delayed   Solid     Solid: Impaired Presentation: Self Fed Oral Phase Impairments: Impaired mastication Oral Phase Functional Implications: Impaired mastication Pharyngeal Phase Impairments: Cough - Delayed     Herbie Baltimore, MA CCC-SLP  Acute Rehabilitation Services Pager [phone] Office [phone]  Ladanian Kelter, Katherene Ponto 11/18/2019,10:49 AM

## 2019-11-18 NOTE — Progress Notes (Signed)
PROGRESS NOTE                                                                                                                                                                                                             Patient Demographics:    Vanessa [name], is a 64 y.o. female, DOB - [birth year], XVQ:[id number]  Admit date - 11/05/2019   Admitting Physician Mckinley Jewel, MD  Outpatient Primary MD for the patient is Maren Reamer, MD (Inactive)  LOS - 12  CC - fever     Brief Narrative    -Vanessa [name] is a 64 y.o. female with medical history significant of hypertension, diabetes, hyperlipidemia, grade 2 diastolic congestive heart failure, CKD stage IIIb, endometrial cancer presents to emergency department due to generalized weakness and lethargy, she was diagnosed with COVID-19 pneumonia with sepsis and hypothermia and admitted to the hospital.    She was recently hospitalized for MSSA bacteremia few weeks ago.  She also carries a history of endometrial cancer under the care of Dr. Berline Lopes GYN oncology deemed a poor surgical candidate for now, also recent GI bleed due to duodenal ulcer.  She has completely recovered from COVID-19 pneumonia her only issue now is persistent hypothermia of unclear etiology, stable TSH and cortisol, appears completely nontoxic, blood cultures negative stable procalcitonin.  Continue providing supportive care.   Subjective:   Patient in bed, appears comfortable, denies any headache, staff she had a good breakfast this morning, no warming blanket requirement overnight .   Assessment  & Plan :     Sepsis due COVID 19 infection -  Etiology not entirely clear, will to be most likely due to Covid, she is hypothermic without leukocytosis, does have COVID-19 infection and mild pneumonia this admission , she recently had MSSA bacteremia few weeks ago. - At this point repeat blood cultures were ordered and stayed negative, stable procalcitonin,  cefazolin has been stopped, she has completed her remdesivir and steroid course for COVID-19 pneumonia, CT chest abdomen pelvis nonacute.  Continue supportive care. -Reports she received the first dose of Covid vaccine last March, she did not receive the second vaccine shot yet.  Hypothermia.   -So far no clear etiology despite extensive work-up, so far there is no evidence of any infectious process. -  currently with stable procalcitonin, TSH, free T4 and cortisol level and negative blood cultures. - could be central in origin due to reset thermostat, labs including TSH TSH cortisol stable, appears nontoxic,  negative blood cultures, stable procalcitonin, completely symptom-free, supportive care with bear hugger, increase room temperature and monitor. -Her medication regimen has been adjusted recently, I have stopped her beta-blocker as case reports of its causing hypothermia. -Recurrent hypothermia requiring intermittent warming blanket, so I have instructed Foley with thermometer for more accurate constant temperature reads , overall her temperature on the lower side at 95 persistently, but she did not require any warming blanket, and this appears to be better than rectal or axillary. -Malnutrition may be contributing to this, so she was encouraged to increase her Ensure intake and to increase her oral intake as well.  Hypernatremia -Overall patient is volume overloaded, but this is most likely due to third spacing, she is intravascularly volume depleted. -Lasix has been stopped given her hyponatremia, I will try on Aldactone been some concern of cirrhosis. -Continue with IV albumin as it seems as to be helping with edema and hypothermia. -Ultrasound showing some liver nodularity concerning for cirrhosis. -I have increased her D5W at 100 cc/h.  Sodium mildly improved to 150 this morning.  Endometrial cancer with intermittent bleeding and spotting.   - Stable H&H, will type screen, outpatient  follow-up with OB oncologist Dr. Berline Lopes once discharged. -Reviewing Dr. Berline Lopes notes, patient plan to be treated with IUD, but she needs to finish 7 days of Flagyl for trichomonas infection, started on Flagyl, to finish total of 7 days.  History of duodenal ulcer with GI bleed.   -She is on PPI, have increased to twice daily, especially as she is being started on DVT prophylaxis dose .  Borderline elevated D-dimer.   - Could be due to her endometrial bleed history, negative lower extremity ultrasound. Tough situation due to recent history of upper GI bleed with ongoing intermittent bleed from her vagina.  But she is not much ambulatory, with COVID-19, elevated D-dimers and malignancy which puts her at significant hypercoagulable state and risk, given her GI bleed more than 4 weeks ago, and her hemoglobin has been stable, I did start her on subcu heparin for DVT prophylaxis, and increased her Protonix to twice daily.  Hypertension.   -Blood pressure overall is elevated, her regimen has been changed multiple times during recent admissions given its hard to control . -For now I will continue with Norvasc, doxazosin, I have stopped her Coreg and decrease hydralazine to see if this helps with hypothermia please see discussion below , blood pressure remains significantly elevated, so I will increase her clonidine today .  Intravascular dehydration with hypernatremia.  -Currently appears to be volume overloaded.   Leg edema mostly nonpitting.  -Started on IV diuresis, but it has been stopped given worsening hypernatremia -Initial imaging showing some evidence of cirrhosis, ammonia level, and give some IV albumin for albumin level of 2.2  MIcrocytic iron deficiency anemia.  On oral iron supplementation likely due to ongoing intermittent vaginal blood loss, outpatient follow-up with PCP and GYN oncology.  Hypokalemia  -Replete  as needed   DM type II.  ISS.  Follow closely blood sugars as she is now  on D5W and Decadron.  May require Lantus and adjustment of short-acting insulin.  Lab Results  Component Value Date   HGBA1C 5.7 (H) 09/15/2019   CBG (last 3)  Recent Labs    11/17/19 2111 11/18/19 0843 11/18/19 1249  GLUCAP 146* 148* 136*      Condition.  Extremely guarded.   Family Communication  :  husband Thebeau [phone] on 5/7.  Code Status :  Full  Disposition Plan  :    Status is: Inpatient  Remains inpatient appropriate because:Hemodynamically unstable, remains with significant hypothermia   Dispo: The patient is from: SNF              Anticipated d/c is to: SNF              Anticipated d/c date is: > 3 days              Patient currently is not medically stable to d/c.   Consults  :  None  Procedures  :    DVT Prophylaxis  :  [place] Heparin     Lab Results  Component Value Date   PLT 156 11/18/2019    Diet :  Diet Order            DIET DYS 2 Room service appropriate? Yes; Fluid consistency: Thin  Diet effective now               Inpatient Medications Scheduled Meds: . albuterol  2 puff Inhalation Q4H  . amLODipine  10 mg Oral Daily  . vitamin C  500 mg Oral Daily  . Chlorhexidine Gluconate Cloth  6 each Topical Daily  . cloNIDine  0.2 mg Oral BID  . doxazosin  4 mg Oral Daily  . feeding supplement (ENSURE ENLIVE)  237 mL Oral TID BM  . feeding supplement (PRO-STAT SUGAR FREE 64)  30 mL Oral TID WC  . ferrous sulfate  325 mg Oral TID WC  . heparin injection (subcutaneous)  5,000 Units Subcutaneous Q8H  . hydrALAZINE  100 mg Oral Q8H  . hydrochlorothiazide  12.5 mg Oral Daily  . insulin aspart  0-15 Units Subcutaneous TID WC  . insulin aspart  0-5 Units Subcutaneous QHS  . mouth rinse  15 mL Mouth Rinse BID  . metroNIDAZOLE  500 mg Oral Q8H  . pantoprazole sodium  40 mg Oral BID  . polyethylene glycol  17 g Oral Daily  . spironolactone  25 mg Oral Daily  . zinc sulfate  220 mg Oral Daily   Continuous Infusions: . dextrose 100  mL/hr at 11/18/19 0910   PRN Meds:.acetaminophen, capsaicin, guaiFENesin-dextromethorphan, loratadine, [DISCONTINUED] ondansetron **OR** ondansetron (ZOFRAN) IV, sodium chloride flush  Antibiotics  :   Anti-infectives (From admission, onward)   Start     Dose/Rate Route Frequency Ordered Stop   11/15/19 1600  metroNIDAZOLE (FLAGYL) tablet 500 mg     500 mg Oral Every 8 hours 11/15/19 1520     11/11/19 1000  doxycycline (VIBRA-TABS) tablet 100 mg  Status:  Discontinued     100 mg Oral Every 12 hours 11/11/19 0729 11/12/19 0921   11/09/19 1230  vancomycin (VANCOREADY) IVPB 1250 mg/250 mL  Status:  Discontinued     1,250 mg 166.7 mL/hr over 90 Minutes Intravenous Every 48 hours 11/07/19 1100 11/09/19 1318   11/08/19 1000  remdesivir 100 mg in sodium chloride 0.9 % 100 mL IVPB     100 mg 200 mL/hr over 30 Minutes Intravenous Daily 11/07/19 1217 11/11/19 1054   11/07/19 1600  piperacillin-tazobactam (ZOSYN) IVPB 3.375 g  Status:  Discontinued     3.375 g 12.5 mL/hr over 240 Minutes Intravenous Every 8 hours 11/07/19 1033 11/11/19 0729   11/07/19 1245  remdesivir 200 mg in sodium chloride 0.9% 250 mL IVPB     200 mg 580 mL/hr over 30 Minutes Intravenous Once 11/07/19 1216 11/07/19 1728   11/07/19 1130  vancomycin (  VANCOREADY) IVPB 2000 mg/400 mL     2,000 mg 200 mL/hr over 120 Minutes Intravenous  Once 11/07/19 1053 11/07/19 1432   11/07/19 1100  piperacillin-tazobactam (ZOSYN) IVPB 3.375 g     3.375 g 100 mL/hr over 30 Minutes Intravenous  Once 11/07/19 1033 11/07/19 1303          Objective:   Vitals:   11/18/19 1105 11/18/19 1119 11/18/19 1128 11/18/19 1301  BP: (!) 162/78   (!) 172/81  Pulse: 65  76 66  Resp: (!) 23  18 18   Temp: (!) 96.1 F (35.6 C) (!) 95.9 F (35.5 C) (!) 96.1 F (35.6 C) (!) 95.5 F (35.3 C)  TempSrc: Bladder Axillary  Bladder  SpO2: 93%  (!) 87% 97%  Weight:      Height:        SpO2: 97 % O2 Flow Rate (L/min): 1 L/min  Wt Readings from Last 3  Encounters:  11/18/19 93.2 kg  10/18/19 100.3 kg  10/17/19 94.9 kg     Intake/Output Summary (Last 24 hours) at 11/18/2019 1335 Last data filed at 11/18/2019 0311 Gross per 24 hour  Intake --  Output 1675 ml  Net -1675 ml     Physical Exam  Awake Alert, Oriented X 3, No new F.N deficits, more awake and appropriate today, poor dentition Symmetrical Chest wall movement, Good air movement bilaterally, CTAB RRR,No Gallops,Rubs or new Murmurs, No Parasternal Heave +ve B.Sounds, Abd Soft, No tenderness, No rebound - guarding or rigidity. No Cyanosis, Clubbing , he remains with significant generalized edema., No new Rash or bruise      Data Review:    Recent Labs  Lab 11/12/19 0519 11/12/19 0519 11/13/19 0547 11/13/19 0547 11/14/19 0259 11/15/19 0206 11/16/19 0330 11/17/19 0424 11/18/19 0328  WBC 11.1*   < > 8.0   < > 5.0 6.0 6.9 6.3 6.1  HGB 10.2*   < > 9.5*   < > 8.9* 8.7* 8.1* 8.4* 8.2*  HCT 33.6*   < > 31.4*   < > 29.5* 29.5* 28.0* 28.8* 27.9*  PLT 147*   < > 129*   < > 101* 120* 128* 135* 156  MCV 96.6   < > 96.0   < > 95.8 96.7 98.2 98.3 98.6  MCH 29.3   < > 29.1   < > 28.9 28.5 28.4 28.7 29.0  MCHC 30.4   < > 30.3   < > 30.2 29.5* 28.9* 29.2* 29.4*  RDW 18.3*   < > 18.1*   < > 18.2* 18.6* 18.9* 18.7* 18.9*  LYMPHSABS 0.3*  --  0.4*  --  0.4*  --   --   --   --   MONOABS 0.4  --  0.4  --  0.2  --   --   --   --   EOSABS 0.0  --  0.0  --  0.0  --   --   --   --   BASOSABS 0.0  --  0.0  --  0.0  --   --   --   --    < > = values in this interval not displayed.    Recent Labs  Lab 11/12/19 0519 11/12/19 0519 11/13/19 0547 11/13/19 0547 11/13/19 1730 11/14/19 0259 11/15/19 0206 11/15/19 2110 11/16/19 0330 11/17/19 0424 11/18/19 0328  NA 146*   < > 146*   < >  --  147* 150*  --  153* 153* 152*  K 3.1*   < >  3.0*   < >  --  3.7 3.8  --  3.7 3.6 3.5  CL 108   < > 110   < >  --  113* 115*  --  114* 113* 114*  CO2 25   < > 25   < >  --  25 28  --  29 29 30    GLUCOSE 130*   < > 126*   < >  --  138* 148*  --  119* 125* 134*  BUN 42*   < > 44*   < >  --  48* 47*  --  48* 44* 42*  CREATININE 1.79*   < > 1.77*   < >  --  1.67* 1.68*  --  1.71* 1.63* 1.70*  CALCIUM 8.3*   < > 8.3*   < >  --  8.0* 8.1*  --  8.6* 8.9 9.0  AST 16   < > 13*   < >  --  12* 11*  --  10* 10* 11*  ALT 6   < > 6   < >  --  6 6  --  6 6 6   ALKPHOS 89   < > 98   < >  --  95 93  --  87 79 86  BILITOT 1.0   < > 0.8   < >  --  0.8 0.8  --  0.8 1.0 0.7  ALBUMIN 2.5*   < > 2.2*   < >  --  2.2* 2.2*  --  2.5* 3.2* 3.1*  MG 1.7  --  1.8  --   --  1.8  --   --   --   --   --   CRP 4.4*  --  9.1*  --   --  8.8*  --   --   --   --   --   DDIMER 2.95*  --  2.39*  --   --  1.85*  --   --   --   --   --   PROCALCITON  --   --   --   --  0.25  --   --   --   --   --   --   TSH  --   --   --   --  4.135  --   --   --   --   --   --   AMMONIA  --   --   --   --   --   --   --  22  --   --   --    < > = values in this interval not displayed.    Recent Labs  Lab 11/12/19 0519 11/13/19 0547 11/13/19 1730 11/14/19 0259  CRP 4.4* 9.1*  --  8.8*  DDIMER 2.95* 2.39*  --  1.85*  PROCALCITON  --   --  0.25  --     ------------------------------------------------------------------------------------------------------------------ No results for input(s): CHOL, HDL, LDLCALC, TRIG, CHOLHDL, LDLDIRECT in the last 72 hours.  Lab Results  Component Value Date   HGBA1C 5.7 (H) 09/15/2019   ------------------------------------------------------------------------------------------------------------------ No results for input(s): TSH, T4TOTAL, T3FREE, THYROIDAB in the last 72 hours.  Invalid input(s): FREET3 ------------------------------------------------------------------------------------------------------------------ No results for input(s): VITAMINB12, FOLATE, FERRITIN, TIBC, IRON, RETICCTPCT in the last 72 hours.  Coagulation profile No results for input(s): INR, PROTIME in the last 168  hours.  No results for input(s): DDIMER in  the last 72 hours.  Cardiac Enzymes No results for input(s): CKMB, TROPONINI, MYOGLOBIN in the last 168 hours.  Invalid input(s): CK ------------------------------------------------------------------------------------------------------------------    Component Value Date/Time   BNP 1,043.9 (H) 11/09/2019 0430    Micro Results Recent Results (from the past 240 hour(s))  Culture, blood (routine x 2)     Status: None   Collection Time: 11/13/19  6:09 PM   Specimen: BLOOD  Result Value Ref Range Status   Specimen Description BLOOD LEFT ANTECUBITAL  Final   Special Requests   Final    BOTTLES DRAWN AEROBIC AND ANAEROBIC Blood Culture adequate volume   Culture   Final    NO GROWTH 5 DAYS Performed at [facility], [address], [place], [place] [address]    Report Status 11/18/2019 FINAL  Final  Culture, blood (routine x 2)     Status: None   Collection Time: 11/13/19  6:16 PM   Specimen: BLOOD LEFT HAND  Result Value Ref Range Status   Specimen Description BLOOD LEFT HAND  Final   Special Requests   Final    BOTTLES DRAWN AEROBIC AND ANAEROBIC Blood Culture adequate volume   Culture   Final    NO GROWTH 5 DAYS Performed at [facility], [place] [address] [place], [place] [address]    Report Status 11/18/2019 FINAL  Final    Radiology Reports CT ABDOMEN PELVIS WO CONTRAST  Result Date: 11/07/2019 CLINICAL DATA:  Generalized weakness and lethargy. Sepsis. Ascites. Cough. COVID positive yesterday. EXAM: CT CHEST, ABDOMEN AND PELVIS WITHOUT CONTRAST TECHNIQUE: Multidetector CT imaging of the chest, abdomen and pelvis was performed following the standard protocol without IV contrast. COMPARISON:  Radiograph yesterday. Abdominopelvic CT 09/15/2019 FINDINGS: CT CHEST FINDINGS Cardiovascular: Right upper extremity PICC with tip in the SVC. Motion artifact partially obscures detailed evaluation. Normal caliber thoracic aorta. Mild  atherosclerosis. Mild cardiomegaly. No pericardial effusion. Mediastinum/Nodes: Motion and lack of contrast limits assessment for adenopathy. Small mediastinal lymph nodes not definitively enlarged. Assessment for hilar adenopathy is significantly limited. Esophagus is patulous. No suspicious thyroid nodule. Lungs/Pleura: Moderate right and small left pleural effusion. Adjacent compressive atelectasis. There are multifocal geographic patchy ground-glass opacities throughout both lungs. Trachea and mainstem bronchi are grossly patent, partially motion obscured. Musculoskeletal: Generalized subcutaneous edema throughout the chest wall. Diffuse degenerative change throughout the thoracic spine. There are no acute or suspicious osseous abnormalities. CT ABDOMEN PELVIS FINDINGS Hepatobiliary: No evidence of focal hepatic abnormality on noncontrast exam. Liver is prominent size spanning 20 cm cranial caudal. Question of nodular hepatic contours. Hyperdense gallbladder contents. No calcified gallstone. Common bile duct is not well-defined. Pancreas: No pancreatic ductal dilatation. No disproportionate peripancreatic edema. Spleen: Normal in size without focal abnormality. Adrenals/Urinary Tract: Adrenal thickening without dominant nodule. Chronic left renal atrophy. Left ureter is nondilated, unchanged 7 mm calcification in the left pelvis equivocally within the ureter. Right kidney appears normal in size. No hydronephrosis or renal calculi. Urinary bladder is distended and unremarkable. Stomach/Bowel: Bowel evaluation is limited in the absence of contrast, presence of ascites, and motion artifact. Nondistended stomach, grossly unremarkable. No small bowel obstruction. Appendix not visualized. Moderate volume of stool throughout the colon. There is mild colonic redundancy. No obvious colonic wall thickening or pericolonic inflammation. Vascular/Lymphatic: Abdominal aorta is normal in caliber. No bulky abdominopelvic  adenopathy. Reproductive: Uterus is slightly bulky. Limited assessment for adnexal mass given pelvic ascites. Other: Small to moderate volume abdominopelvic ascites. There is generalized edema of the  intra-abdominal fat. Prominent generalized edema of the subcutaneous fat, confluent in the flanks. No free intra-abdominal air. Musculoskeletal: Degenerative change in the lumbar spine. There are no acute or suspicious osseous abnormalities. IMPRESSION: 1. Multifocal geographic patchy ground-glass opacities throughout both lungs, consistent with COVID-19 pneumonia. 2. Fluid overload. Moderate right and small left pleural effusion. Small to moderate volume abdominopelvic ascites. Generalized subcutaneous edema of the chest and abdomen consistent with third-spacing, confluent edema in the flanks. 3. Question of nodular hepatic contours, recommend correlation for cirrhosis. 4. Chronic left renal atrophy. Unchanged 7 mm calcification in the left pelvis equivocally within the ureter. No hydronephrosis. Aortic Atherosclerosis (ICD10-I70.0). Electronically Signed   By: Keith Rake M.D.   On: 11/07/2019 23:00   CT CHEST WO CONTRAST  Result Date: 11/07/2019 CLINICAL DATA:  Generalized weakness and lethargy. Sepsis. Ascites. Cough. COVID positive yesterday. EXAM: CT CHEST, ABDOMEN AND PELVIS WITHOUT CONTRAST TECHNIQUE: Multidetector CT imaging of the chest, abdomen and pelvis was performed following the standard protocol without IV contrast. COMPARISON:  Radiograph yesterday. Abdominopelvic CT 09/15/2019 FINDINGS: CT CHEST FINDINGS Cardiovascular: Right upper extremity PICC with tip in the SVC. Motion artifact partially obscures detailed evaluation. Normal caliber thoracic aorta. Mild atherosclerosis. Mild cardiomegaly. No pericardial effusion. Mediastinum/Nodes: Motion and lack of contrast limits assessment for adenopathy. Small mediastinal lymph nodes not definitively enlarged. Assessment for hilar adenopathy is  significantly limited. Esophagus is patulous. No suspicious thyroid nodule. Lungs/Pleura: Moderate right and small left pleural effusion. Adjacent compressive atelectasis. There are multifocal geographic patchy ground-glass opacities throughout both lungs. Trachea and mainstem bronchi are grossly patent, partially motion obscured. Musculoskeletal: Generalized subcutaneous edema throughout the chest wall. Diffuse degenerative change throughout the thoracic spine. There are no acute or suspicious osseous abnormalities. CT ABDOMEN PELVIS FINDINGS Hepatobiliary: No evidence of focal hepatic abnormality on noncontrast exam. Liver is prominent size spanning 20 cm cranial caudal. Question of nodular hepatic contours. Hyperdense gallbladder contents. No calcified gallstone. Common bile duct is not well-defined. Pancreas: No pancreatic ductal dilatation. No disproportionate peripancreatic edema. Spleen: Normal in size without focal abnormality. Adrenals/Urinary Tract: Adrenal thickening without dominant nodule. Chronic left renal atrophy. Left ureter is nondilated, unchanged 7 mm calcification in the left pelvis equivocally within the ureter. Right kidney appears normal in size. No hydronephrosis or renal calculi. Urinary bladder is distended and unremarkable. Stomach/Bowel: Bowel evaluation is limited in the absence of contrast, presence of ascites, and motion artifact. Nondistended stomach, grossly unremarkable. No small bowel obstruction. Appendix not visualized. Moderate volume of stool throughout the colon. There is mild colonic redundancy. No obvious colonic wall thickening or pericolonic inflammation. Vascular/Lymphatic: Abdominal aorta is normal in caliber. No bulky abdominopelvic adenopathy. Reproductive: Uterus is slightly bulky. Limited assessment for adnexal mass given pelvic ascites. Other: Small to moderate volume abdominopelvic ascites. There is generalized edema of the intra-abdominal fat. Prominent  generalized edema of the subcutaneous fat, confluent in the flanks. No free intra-abdominal air. Musculoskeletal: Degenerative change in the lumbar spine. There are no acute or suspicious osseous abnormalities. IMPRESSION: 1. Multifocal geographic patchy ground-glass opacities throughout both lungs, consistent with COVID-19 pneumonia. 2. Fluid overload. Moderate right and small left pleural effusion. Small to moderate volume abdominopelvic ascites. Generalized subcutaneous edema of the chest and abdomen consistent with third-spacing, confluent edema in the flanks. 3. Question of nodular hepatic contours, recommend correlation for cirrhosis. 4. Chronic left renal atrophy. Unchanged 7 mm calcification in the left pelvis equivocally within the ureter. No hydronephrosis. Aortic Atherosclerosis (ICD10-I70.0). Electronically Signed   By: Threasa Beards  Sanford M.D.   On: 11/07/2019 23:00   DG CHEST PORT 1 VIEW  Result Date: 11/16/2019 CLINICAL DATA:  Shortness of breath.  COVID-19 positive EXAM: PORTABLE CHEST 1 VIEW COMPARISON:  Nov 09, 2019 FINDINGS: Central catheter tip is in the superior vena cava. No pneumothorax. Airspace opacity is noted bilaterally, more extensive on the right than on the left. There has been slight interval clearing on the left without appreciable change on the right compared to most recent study. This opacity is primarily in a perihilar distribution bilaterally. There is a small layering right pleural effusion. There is cardiomegaly with pulmonary venous hypertension. No adenopathy. No bone lesions. IMPRESSION: Cardiomegaly with pulmonary vascular congestion. Airspace opacity is noted bilaterally, more on the right than on the left with partial clearing from the left upper lobe and left base compared to previous study. No change on the right. Small right pleural effusion. Suspect multifocal pneumonia, although there may well be superimposed pulmonary edema. Question both pneumonia and congestive  heart failure concurrent. Electronically Signed   By: Lowella Grip III M.D.   On: 11/16/2019 09:49   DG Chest Port 1 View  Result Date: 11/09/2019 CLINICAL DATA:  Weakness, COVID-19 positive EXAM: PORTABLE CHEST 1 VIEW COMPARISON:  Chest radiograph from one day prior. FINDINGS: Right PICC terminates in the lower third of the SVC. Stable cardiomediastinal silhouette with mild cardiomegaly. No pneumothorax. No pleural effusion. Extensive patchy opacities throughout both lungs, slightly worsened in the upper lungs. IMPRESSION: Extensive patchy opacities throughout both lungs, slightly worsened in the upper lungs, compatible with COVID-19 pneumonia, although with component of pulmonary edema not excluded. Mild cardiomegaly. Electronically Signed   By: Ilona Sorrel M.D.   On: 11/09/2019 08:19   DG Chest Port 1 View  Result Date: 11/08/2019 CLINICAL DATA:  Shortness of breath, COVID-19, diabetes mellitus, hypertension, stage III chronic kidney disease, history endometrial cancer EXAM: PORTABLE CHEST 1 VIEW COMPARISON:  Portable exam 0816 hours compared to 11/06/2019 FINDINGS: RIGHT arm PICC line tip projects over SVC. Enlargement of cardiac silhouette with stable mediastinal contours. Extensive BILATERAL pulmonary infiltrates greatest perihilar on RIGHT, question asymmetric pulmonary edema versus multifocal infection. No pleural effusion or pneumothorax. Bones demineralized with scattered degenerative changes of the thoracic spine. IMPRESSION: Enlargement of cardiac silhouette with increased BILATERAL pulmonary infiltrates greatest RIGHT perihilar question asymmetric pulmonary edema versus multifocal pneumonia. Electronically Signed   By: Lavonia Dana M.D.   On: 11/08/2019 08:51   DG CHEST PORT 1 VIEW  Result Date: 11/06/2019 CLINICAL DATA:  Weakness. EXAM: PORTABLE CHEST 1 VIEW COMPARISON:  November 05, 2019. FINDINGS: Stable cardiomegaly with possible central pulmonary vascular congestion. No pneumothorax is  noted. Mild bibasilar atelectasis is noted with small pleural effusions. Bony thorax is unremarkable. IMPRESSION: Stable cardiomegaly with possible central pulmonary vascular congestion. Mild bibasilar subsegmental atelectasis is noted with small pleural effusions. Electronically Signed   By: Marijo Conception M.D.   On: 11/06/2019 15:57   DG Chest Port 1 View  Result Date: 11/05/2019 CLINICAL DATA:  Weakness. Found on floor today. EXAM: PORTABLE CHEST 1 VIEW COMPARISON:  10/18/2019 FINDINGS: Stable heart size and mediastinal contours with mild cardiomegaly. Hazy opacity at both lung bases likely combination of pleural fluid and airspace disease/atelectasis. Vascular congestion. No pneumothorax. Left proximal humerus fracture appears subacute with surrounding heterotopic ossification, also seen on 09/17/2019 shoulder radiograph. IMPRESSION: 1. Hazy opacity at both lung bases likely combination of pleural fluid and airspace disease/atelectasis. This is stable on the right but progressive  on the left from prior. 2. Stable mild cardiomegaly.  Vascular congestion. Electronically Signed   By: Keith Rake M.D.   On: 11/05/2019 17:02   VAS [place] LOWER EXTREMITY VENOUS (DVT)  Result Date: 11/07/2019  Lower Venous DVTStudy Indications: Swelling, and Edema.  Limitations: Body habitus and poor ultrasound/tissue interface. Comparison Study: 09/16/19 previous Performing Technologist: Abram Sander RVS  Examination Guidelines: A complete evaluation includes B-mode imaging, spectral Doppler, color Doppler, and power Doppler as needed of all accessible portions of each vessel. Bilateral testing is considered an integral part of a complete examination. Limited examinations for reoccurring indications may be performed as noted. The reflux portion of the exam is performed with the patient in reverse Trendelenburg.  +---------+---------------+---------+-----------+----------+--------------+ RIGHT     CompressibilityPhasicitySpontaneityPropertiesThrombus Aging +---------+---------------+---------+-----------+----------+--------------+ CFV      Full           Yes      Yes                                 +---------+---------------+---------+-----------+----------+--------------+ SFJ      Full                                                        +---------+---------------+---------+-----------+----------+--------------+ FV Prox  Full                                                        +---------+---------------+---------+-----------+----------+--------------+ FV Mid                                                Not visualized +---------+---------------+---------+-----------+----------+--------------+ FV Distal               Yes      Yes                                 +---------+---------------+---------+-----------+----------+--------------+ PFV      Full                                                        +---------+---------------+---------+-----------+----------+--------------+ POP      Full           Yes      Yes                                 +---------+---------------+---------+-----------+----------+--------------+ PTV                                                   Not visualized +---------+---------------+---------+-----------+----------+--------------+ PERO  Not visualized +---------+---------------+---------+-----------+----------+--------------+   +---------+---------------+---------+-----------+----------+--------------+ LEFT     CompressibilityPhasicitySpontaneityPropertiesThrombus Aging +---------+---------------+---------+-----------+----------+--------------+ CFV      Full           Yes      Yes                                 +---------+---------------+---------+-----------+----------+--------------+ SFJ      Full                                                         +---------+---------------+---------+-----------+----------+--------------+ FV Prox  Full                                                        +---------+---------------+---------+-----------+----------+--------------+ FV Mid                  Yes      Yes                                 +---------+---------------+---------+-----------+----------+--------------+ FV Distal               Yes      Yes                                 +---------+---------------+---------+-----------+----------+--------------+ PFV      Full                                                        +---------+---------------+---------+-----------+----------+--------------+ POP      Full           Yes      Yes                                 +---------+---------------+---------+-----------+----------+--------------+ PTV      Full                                                        +---------+---------------+---------+-----------+----------+--------------+ PERO                                                  Not visualized +---------+---------------+---------+-----------+----------+--------------+     Summary: BILATERAL: - No evidence of deep vein thrombosis seen in the lower extremities, bilaterally.   *See table(s) above for measurements and observations. Electronically signed by Curt Jews MD on 11/07/2019 at 4:26:57 PM.    Final    [place] EKG SITE RITE  Result Date: 11/07/2019  If [employer] not attached, placement could not be confirmed due to current cardiac rhythm.  US Abdomen Limited RUQ  Result Date: 11/16/2019 CLINICAL DATA:  Cirrhosis EXAM: ULTRASOUND ABDOMEN LIMITED RIGHT UPPER QUADRANT COMPARISON:  11/07/2019 FINDINGS: Gallbladder: The gallbladder wall is thickened measuring approximately 8.6 mm in thickness. The sonographic Murphy sign cannot be adequately assessed. There is gallbladder sludge. There is some mild pericholecystic free fluid. Common bile  duct: Diameter: 6 mm Liver: The liver surface is nodular. There is no discrete hepatic mass. Portal vein is patent on color Doppler imaging with normal direction of blood flow towards the liver. Other: There is a small volume of ascites in the upper abdomen. There is right-sided pelviectasis. The right kidney is echogenic. IMPRESSION: 1. There is gallbladder sludge with associated gallbladder wall thickening. The sonographic Murphy sign cannot be adequately assessed. If there is high clinical suspicion for acute cholecystitis, follow-up with HIDA scan is recommended. 2. Nodular appearance of the liver which can be seen in patients with cirrhosis. There is no discrete hepatic mass. 3. Echogenic right kidney suggestive of underlying medical renal disease. 4. Right-sided pelviectasis of unknown clinical significance. This appears to be new since the patient's prior CT dated 11/07/2019 5. Small volume abdominal ascites. Electronically Signed   By: Constance Holster M.D.   On: 11/16/2019 17:14     Phillips Climes M.D on 11/18/2019 at 1:35 PM  To page go to [email or web address] - password [email or web address]

## 2019-11-18 NOTE — Progress Notes (Signed)
   11/18/19 0900  Assess: MEWS Score  Temp (!) 96.3 F (35.7 C)  BP (!) 167/94  Pulse Rate 76  ECG Heart Rate 76  Resp (!) 23  SpO2 91 %  Assess: MEWS Score  MEWS Temp 1  MEWS Systolic 0  MEWS Pulse 0  MEWS RR 1  MEWS LOC 0  MEWS Score 2  MEWS Score Color Yellow  Assess: if the MEWS score is Yellow or Red  Were vital signs taken at a resting state? Yes  Focused Assessment Documented focused assessment  Early Detection of Sepsis Score *See Row Information* Low  MEWS guidelines implemented *See Row Information* Yes  Treat  MEWS Interventions Administered scheduled meds/treatments  Take Vital Signs  Increase Vital Sign Frequency  Yellow: Q 2hr X 2 then Q 4hr X 2, if remains yellow, continue Q 4hrs  Escalate  MEWS: Escalate Yellow: discuss with charge nurse/RN and consider discussing with provider and RRT  Notify: Charge Nurse/RN  Name of Charge Nurse/RN Notified Ramonita Lab RN  Date Charge Nurse/RN Notified 11/18/19  Time Charge Nurse/RN Notified 0912  Document  Progress note created (see row info) Yes

## 2019-11-18 NOTE — Progress Notes (Signed)
Physical Therapy Treatment Patient Details Name: Vanessa [name] MRN: [id number] DOB: 1955-11-08 Today's Date: 11/18/2019    History of Present Illness 64 year old female admitted 11/05/19 with inability to care for herself at home. Patient was recently discharged from SNF and has been unable to care for herself since. She was found to be COVID+. Hypothermia of unclear etiology. PMH includes HTN, DM, CKD, and endometrial cancer.     PT Comments    Pt desaturates to 88% on RA, but is not in any distress (no DOE).  She was able to stand with the RW instead of standing frame today and is ready to progress to gait next session.  PT will continue to follow acutely for safe mobility progression.  Follow Up Recommendations  SNF;Supervision/Assistance - 24 hour     Equipment Recommendations  Rolling walker with 5" wheels;3in1 (PT)(both wide)    Recommendations for Other Services   NA     Precautions / Restrictions Precautions Precautions: Fall    Mobility  Bed Mobility Overal bed mobility: Needs Assistance Bed Mobility: Supine to Sit     Supine to sit: Min assist;HOB elevated     General bed mobility comments: Min hand held assist to come up to sitting EOB.  HOB 45 degrees, pt using railing for leverage.   Transfers Overall transfer level: Needs assistance Equipment used: Rolling walker (2 wheeled) Transfers: Sit to/from [facility] Sit to Stand: +2 physical assistance;Min assist;From elevated surface Stand pivot transfers: +2 physical assistance;Min assist;From elevated surface       General transfer comment: Two person min assist to stand from elevated bed to RW and take 3-4 pivotal steps around to the recliner chair.  Pt with some buckling over weak legs, but did not have to use the stedy standing frame today.          Balance Overall balance assessment: Needs assistance Sitting-balance support: Feet supported;Bilateral upper extremity  supported Sitting balance-Leahy Scale: Fair Sitting balance - Comments: close supervision EOB.    Standing balance support: Bilateral upper extremity supported Standing balance-Leahy Scale: Poor Standing balance comment: needs support from RW and therapist.                             Cognition Arousal/Alertness: Awake/alert Behavior During Therapy: WFL for tasks assessed/performed Overall Cognitive Status: No family/caregiver present to determine baseline cognitive functioning                                 General Comments: Not specifically tested      Exercises Other Exercises Other Exercises: Flutter valve x 10 reps, IS x 10 reps with 260mL max inspired volume, significant cueing as to how to use the IS and I don't think she quite has it yet.     General Comments General comments (skin integrity, edema, etc.): O2 sats to 88% on RA during mobility, re-applied 1 L O2 [place] at end of session to get O2 back in the low 90s.       Pertinent Vitals/Pain Pain Assessment: No/denies pain           PT Goals (current goals can now be found in the care plan section) Acute Rehab PT Goals Patient Stated Goal: get stronger after rehab Progress towards PT goals: Progressing toward goals    Frequency    Min 2X/week  PT Plan Current plan remains appropriate       AM-PAC PT "6 Clicks" Mobility   Outcome Measure  Help needed turning from your back to your side while in a flat bed without using bedrails?: A Little Help needed moving from lying on your back to sitting on the side of a flat bed without using bedrails?: A Little Help needed moving to and from a bed to a chair (including a wheelchair)?: A Little Help needed standing up from a chair using your arms (e.g., wheelchair or bedside chair)?: A Little Help needed to walk in hospital room?: A Lot Help needed climbing 3-5 steps with a railing? : Total 6 Click Score: 15    End of Session  Equipment Utilized During Treatment: Oxygen;Gait belt Activity Tolerance: Patient limited by fatigue Patient left: in chair;with call bell/phone within reach;with chair alarm set Nurse Communication: Mobility status PT Visit Diagnosis: Unsteadiness on feet (R26.81);Difficulty in walking, not elsewhere classified (R26.2)     Time: 6381-7711 PT Time Calculation (min) (ACUTE ONLY): 16 min  Charges:  $Therapeutic Activity: 8-22 mins                    Verdene Lennert, PT, DPT  Acute Rehabilitation [phone] pager #[phone] office     11/18/2019, 2:27 PM

## 2019-11-19 ENCOUNTER — Inpatient Hospital Stay: Payer: Medicare Other | Admitting: Internal Medicine

## 2019-11-19 DIAGNOSIS — K7460 Unspecified cirrhosis of liver: Secondary | ICD-10-CM

## 2019-11-19 LAB — CBC
HCT: 27.3 % — ABNORMAL LOW (ref 36.0–46.0)
Hemoglobin: 8.2 g/dL — ABNORMAL LOW (ref 12.0–15.0)
MCH: 29.4 pg (ref 26.0–34.0)
MCHC: 30 g/dL (ref 30.0–36.0)
MCV: 97.8 fL (ref 80.0–100.0)
Platelets: 165 10*3/uL (ref 150–400)
RBC: 2.79 MIL/uL — ABNORMAL LOW (ref 3.87–5.11)
RDW: 18.9 % — ABNORMAL HIGH (ref 11.5–15.5)
WBC: 7.3 10*3/uL (ref 4.0–10.5)
nRBC: 0.3 % — ABNORMAL HIGH (ref 0.0–0.2)

## 2019-11-19 LAB — COMPREHENSIVE METABOLIC PANEL
ALT: 8 U/L (ref 0–44)
AST: 12 U/L — ABNORMAL LOW (ref 15–41)
Albumin: 2.9 g/dL — ABNORMAL LOW (ref 3.5–5.0)
Alkaline Phosphatase: 81 U/L (ref 38–126)
Anion gap: 8 (ref 5–15)
BUN: 38 mg/dL — ABNORMAL HIGH (ref 8–23)
CO2: 29 mmol/L (ref 22–32)
Calcium: 8.9 mg/dL (ref 8.9–10.3)
Chloride: 112 mmol/L — ABNORMAL HIGH (ref 98–111)
Creatinine, Ser: 1.67 mg/dL — ABNORMAL HIGH (ref 0.44–1.00)
GFR calc Af Amer: 37 mL/min — ABNORMAL LOW (ref >60)
GFR calc non Af Amer: 32 mL/min — ABNORMAL LOW (ref >60)
Glucose, Bld: 149 mg/dL — ABNORMAL HIGH (ref 70–99)
Potassium: 3.6 mmol/L (ref 3.5–5.1)
Sodium: 149 mmol/L — ABNORMAL HIGH (ref 135–145)
Total Bilirubin: 1 mg/dL (ref 0.3–1.2)
Total Protein: 6 g/dL — ABNORMAL LOW (ref 6.5–8.1)

## 2019-11-19 LAB — GLUCOSE, CAPILLARY
Glucose-Capillary: 146 mg/dL — ABNORMAL HIGH (ref 70–99)
Glucose-Capillary: 125 mg/dL — ABNORMAL HIGH (ref 70–99)
Glucose-Capillary: 64 mg/dL — ABNORMAL LOW (ref 70–99)
Glucose-Capillary: 150 mg/dL — ABNORMAL HIGH (ref 70–99)
Glucose-Capillary: 134 mg/dL — ABNORMAL HIGH (ref 70–99)

## 2019-11-19 MED ORDER — POTASSIUM CHLORIDE CRYS ER 20 MEQ PO TBCR
40.0000 meq | EXTENDED_RELEASE_TABLET | Freq: Once | ORAL | Status: AC
  Administered 2019-11-19: 40 meq via ORAL
  Filled 2019-11-19: qty 2

## 2019-11-19 NOTE — Progress Notes (Signed)
PROGRESS NOTE                                                                                                                                                                                                             Patient Demographics:    Vanessa [name], is a 64 y.o. female, DOB - 07-25-1955, PIR:[id number]  Admit date - 11/05/2019   Admitting Physician Mckinley Jewel, MD  Outpatient Primary MD for the patient is Maren Reamer, MD (Inactive)  LOS - 13  CC - fever     Brief Narrative    -Jareli Highland is a 64 y.o. female with medical history significant of hypertension, diabetes, hyperlipidemia, grade 2 diastolic congestive heart failure, CKD stage IIIb, endometrial cancer presents to emergency department due to generalized weakness and lethargy, she was diagnosed with COVID-19 pneumonia with sepsis and hypothermia and admitted to the hospital.   She was recently hospitalized for MSSA bacteremia few weeks ago.  She also carries a history of endometrial cancer under the care of Dr. Berline Lopes GYN oncology deemed a poor surgical candidate for now, also recent GI bleed due to duodenal ulcer. She has completely recovered from COVID-19 pneumonia, her main issue during this hospital stay is persistent hypothermia clear etiology despite extensive work-up, but this appears to be improving.  Subjective:   Patient in bed, appears comfortable, denies any headache, ports she is feeling better, no warming blanket requirement over last 24 hours .   Assessment  & Plan :     Sepsis due COVID 19 infection -  Etiology not entirely clear, will to be most likely due to Covid, she is hypothermic without leukocytosis, does have COVID-19 infection and mild pneumonia this admission , she recently had MSSA bacteremia few weeks ago. - At this point repeat blood cultures were ordered and stayed negative, stable procalcitonin, cefazolin has been stopped, she has completed her remdesivir and steroid  course for COVID-19 pneumonia, CT chest abdomen pelvis nonacute.  Continue supportive care. -Reports she received the first dose of Covid vaccine last March, she did not receive the second vaccine shot yet.  Hypothermia.   -So far no clear etiology despite extensive work-up, so far there is no evidence of any infectious process. -  currently with stable procalcitonin, TSH, free T4 and cortisol level and negative blood cultures.  This work-up has been repeated couple times and appears to be within normal limits. - could be central in origin due to reset thermostat. -Her medication  regimen has been adjusted recently, I have stopped her beta-blocker as case reports of its causing hypothermia. -Recurrent hypothermia requiring intermittent warming blanket, so I have instructed Foley with thermometer for more accurate constant temperature reads , has been giving more accurate reading, her hypothermia is to be has stabilized with no warming blanket requirement over the last 24 hours . -We will obtain couple of rectal temperature read to see if it correlates with the Foley thermometer read. -Could not identify the exact etiology of her hypothermia, but hypothermia appears to be improved after stopping her Coreg, and addressing her nutritional status and encouraging her oral intake and giving her IV albumin.  Hypernatremia -Volume overloaded initially, but this has improved after IV Lasix, currently on Aldactone . -Remains on D5W at 100 cc/h, her volume status appears to be significantly improved .  Endometrial cancer with intermittent bleeding and spotting.   - Stable H&H, will type screen, outpatient follow-up with OB oncologist Dr. Berline Lopes once discharged. -Reviewing Dr. Berline Lopes notes, patient plan to be treated with IUD, but she needs to finish 7 days of Flagyl for trichomonas infection, started on Flagyl, to finish total of 7 days.  History of duodenal ulcer with GI bleed.   -She is on PPI, have  increased to twice daily, especially as she is being started on DVT prophylaxis dose .  Borderline elevated D-dimer.   - Could be due to her endometrial bleed history, negative lower extremity ultrasound. Tough situation due to recent history of upper GI bleed with ongoing intermittent bleed from her vagina.  But she is not much ambulatory, with COVID-19, elevated D-dimers and malignancy which puts her at significant hypercoagulable state and risk, given her GI bleed more than 4 weeks ago, and her hemoglobin has been stable, I did start her on subcu heparin for DVT prophylaxis, and increased her Protonix to twice daily.  Hypertension.   -Blood pressure overall is elevated, her regimen has been changed multiple times during recent admissions given its hard to control . -For now I will continue with Norvasc, doxazosin, I have stopped her Coreg and decrease hydralazine to see if this helps with hypothermia please see discussion below , blood pressure remains significantly elevated, so I will increase her clonidine today .  Patient level cirrhosis -Possibly due to NASH , she is on Aldactone  Intravascular dehydration with hypernatremia.  -Currently appears to be volume overloaded.  Leg edema mostly nonpitting.  -This has significantly improved after IV diuresis, Aldactone and IV albumin .  MIcrocytic iron deficiency anemia.  On oral iron supplementation likely due to ongoing intermittent vaginal blood loss, outpatient follow-up with PCP and GYN oncology.  Hypokalemia  -Replete  as needed   DM type II.  ISS.   -He remains with some intermittent hypoglycemia despite being on D5W .  Lab Results  Component Value Date   HGBA1C 5.7 (H) 09/15/2019   CBG (last 3)  Recent Labs    11/19/19 0743 11/19/19 1155 11/19/19 1222  GLUCAP 146* 64* 125*      Condition.  Extremely guarded.   Family Communication  :  husband Vanessa [name] [phone] on 5/7.  Code Status :  Full  Disposition Plan  :      Status is: Inpatient  Remains inpatient appropriate because:Hemodynamically unstable, she remains with hypernatremia despite being on D5W 100 cc/h.  Dispo: The patient is from: SNF              Anticipated d/c is to: SNF  Anticipated d/c date is: 2 days              Patient currently is not medically stable to d/c.   Consults  :  None  Procedures  :    DVT Prophylaxis  :  [place] Heparin     Lab Results  Component Value Date   PLT 165 11/19/2019    Diet :  Diet Order            DIET DYS 2 Room service appropriate? Yes; Fluid consistency: Thin  Diet effective now               Inpatient Medications Scheduled Meds: . albuterol  2 puff Inhalation Q4H  . amLODipine  10 mg Oral Daily  . vitamin C  500 mg Oral Daily  . Chlorhexidine Gluconate Cloth  6 each Topical Daily  . cloNIDine  0.2 mg Oral BID  . doxazosin  4 mg Oral Daily  . feeding supplement (ENSURE ENLIVE)  237 mL Oral TID BM  . feeding supplement (PRO-STAT SUGAR FREE 64)  30 mL Oral TID WC  . ferrous sulfate  325 mg Oral TID WC  . heparin injection (subcutaneous)  5,000 Units Subcutaneous Q8H  . hydrALAZINE  100 mg Oral Q8H  . hydrochlorothiazide  12.5 mg Oral Daily  . insulin aspart  0-15 Units Subcutaneous TID WC  . insulin aspart  0-5 Units Subcutaneous QHS  . mouth rinse  15 mL Mouth Rinse BID  . metroNIDAZOLE  500 mg Oral Q8H  . pantoprazole sodium  40 mg Oral BID  . polyethylene glycol  17 g Oral Daily  . spironolactone  25 mg Oral Daily  . zinc sulfate  220 mg Oral Daily   Continuous Infusions: . dextrose 100 mL/hr at 11/18/19 2050   PRN Meds:.acetaminophen, capsaicin, guaiFENesin-dextromethorphan, loratadine, [DISCONTINUED] ondansetron **OR** ondansetron (ZOFRAN) IV, sodium chloride flush  Antibiotics  :   Anti-infectives (From admission, onward)   Start     Dose/Rate Route Frequency Ordered Stop   11/15/19 1600  metroNIDAZOLE (FLAGYL) tablet 500 mg     500 mg Oral Every 8 hours  11/15/19 1520     11/11/19 1000  doxycycline (VIBRA-TABS) tablet 100 mg  Status:  Discontinued     100 mg Oral Every 12 hours 11/11/19 0729 11/12/19 0921   11/09/19 1230  vancomycin (VANCOREADY) IVPB 1250 mg/250 mL  Status:  Discontinued     1,250 mg 166.7 mL/hr over 90 Minutes Intravenous Every 48 hours 11/07/19 1100 11/09/19 1318   11/08/19 1000  remdesivir 100 mg in sodium chloride 0.9 % 100 mL IVPB     100 mg 200 mL/hr over 30 Minutes Intravenous Daily 11/07/19 1217 11/11/19 1054   11/07/19 1600  piperacillin-tazobactam (ZOSYN) IVPB 3.375 g  Status:  Discontinued     3.375 g 12.5 mL/hr over 240 Minutes Intravenous Every 8 hours 11/07/19 1033 11/11/19 0729   11/07/19 1245  remdesivir 200 mg in sodium chloride 0.9% 250 mL IVPB     200 mg 580 mL/hr over 30 Minutes Intravenous Once 11/07/19 1216 11/07/19 1728   11/07/19 1130  vancomycin (VANCOREADY) IVPB 2000 mg/400 mL     2,000 mg 200 mL/hr over 120 Minutes Intravenous  Once 11/07/19 1053 11/07/19 1432   11/07/19 1100  piperacillin-tazobactam (ZOSYN) IVPB 3.375 g     3.375 g 100 mL/hr over 30 Minutes Intravenous  Once 11/07/19 1033 11/07/19 1303          Objective:  Vitals:   11/19/19 0800 11/19/19 1000 11/19/19 1155 11/19/19 1200  BP: (!) 165/73 (!) 149/73 (!) 149/73 (!) 142/81  Pulse: 73 65 68 69  Resp: (!) 22 18 20    Temp: (!) 96.8 F (36 C) (!) 96.6 F (35.9 C)  (!) 95.5 F (35.3 C)  TempSrc: Bladder Bladder  Bladder  SpO2: 96% 97% 92% 91%  Weight:      Height:        SpO2: 91 % O2 Flow Rate (L/min): 1 L/min  Wt Readings from Last 3 Encounters:  11/19/19 94 kg  10/18/19 100.3 kg  10/17/19 94.9 kg     Intake/Output Summary (Last 24 hours) at 11/19/2019 1251 Last data filed at 11/19/2019 3825 Gross per 24 hour  Intake 3777.94 ml  Output 1500 ml  Net 2277.94 ml     Physical Exam Awake Alert, Oriented X 3, No new F.N deficits, Normal affect, poor dentition Symmetrical Chest wall movement, Good air  movement bilaterally, CTAB RRR,No Gallops,Rubs or new Murmurs, No Parasternal Heave +ve B.Sounds, Abd Soft, No tenderness, No rebound - guarding or rigidity. No Cyanosis, Clubbing,  generalized edema has significantly improved    Data Review:    Recent Labs  Lab 11/13/19 0547 11/13/19 0547 11/14/19 0259 11/14/19 0259 11/15/19 0206 11/16/19 0330 11/17/19 0424 11/18/19 0328 11/19/19 0255  WBC 8.0   < > 5.0   < > 6.0 6.9 6.3 6.1 7.3  HGB 9.5*   < > 8.9*   < > 8.7* 8.1* 8.4* 8.2* 8.2*  HCT 31.4*   < > 29.5*   < > 29.5* 28.0* 28.8* 27.9* 27.3*  PLT 129*   < > 101*   < > 120* 128* 135* 156 165  MCV 96.0   < > 95.8   < > 96.7 98.2 98.3 98.6 97.8  MCH 29.1   < > 28.9   < > 28.5 28.4 28.7 29.0 29.4  MCHC 30.3   < > 30.2   < > 29.5* 28.9* 29.2* 29.4* 30.0  RDW 18.1*   < > 18.2*   < > 18.6* 18.9* 18.7* 18.9* 18.9*  LYMPHSABS 0.4*  --  0.4*  --   --   --   --   --   --   MONOABS 0.4  --  0.2  --   --   --   --   --   --   EOSABS 0.0  --  0.0  --   --   --   --   --   --   BASOSABS 0.0  --  0.0  --   --   --   --   --   --    < > = values in this interval not displayed.    Recent Labs  Lab 11/13/19 0547 11/13/19 0547 11/13/19 1730 11/14/19 0259 11/14/19 0259 11/15/19 0206 11/15/19 2110 11/16/19 0330 11/17/19 0424 11/18/19 0328 11/19/19 0255  NA 146*   < >  --  147*   < > 150*  --  153* 153* 152* 149*  K 3.0*   < >  --  3.7   < > 3.8  --  3.7 3.6 3.5 3.6  CL 110   < >  --  113*   < > 115*  --  114* 113* 114* 112*  CO2 25   < >  --  25   < > 28  --  29 29 30 29   GLUCOSE 126*   < >  --  138*   < > 148*  --  119* 125* 134* 149*  BUN 44*   < >  --  48*   < > 47*  --  48* 44* 42* 38*  CREATININE 1.77*   < >  --  1.67*   < > 1.68*  --  1.71* 1.63* 1.70* 1.67*  CALCIUM 8.3*   < >  --  8.0*   < > 8.1*  --  8.6* 8.9 9.0 8.9  AST 13*   < >  --  12*   < > 11*  --  10* 10* 11* 12*  ALT 6   < >  --  6   < > 6  --  6 6 6 8   ALKPHOS 98   < >  --  95   < > 93  --  87 79 86 81  BILITOT  0.8   < >  --  0.8   < > 0.8  --  0.8 1.0 0.7 1.0  ALBUMIN 2.2*   < >  --  2.2*   < > 2.2*  --  2.5* 3.2* 3.1* 2.9*  MG 1.8  --   --  1.8  --   --   --   --   --   --   --   CRP 9.1*  --   --  8.8*  --   --   --   --   --   --   --   DDIMER 2.39*  --   --  1.85*  --   --   --   --   --   --   --   PROCALCITON  --   --  0.25  --   --   --   --   --   --   --   --   TSH  --   --  4.135  --   --   --   --   --   --   --   --   AMMONIA  --   --   --   --   --   --  22  --   --   --   --    < > = values in this interval not displayed.    Recent Labs  Lab 11/13/19 0547 11/13/19 1730 11/14/19 0259  CRP 9.1*  --  8.8*  DDIMER 2.39*  --  1.85*  PROCALCITON  --  0.25  --     ------------------------------------------------------------------------------------------------------------------ No results for input(s): CHOL, HDL, LDLCALC, TRIG, CHOLHDL, LDLDIRECT in the last 72 hours.  Lab Results  Component Value Date   HGBA1C 5.7 (H) 09/15/2019   ------------------------------------------------------------------------------------------------------------------ No results for input(s): TSH, T4TOTAL, T3FREE, THYROIDAB in the last 72 hours.  Invalid input(s): FREET3 ------------------------------------------------------------------------------------------------------------------ No results for input(s): VITAMINB12, FOLATE, FERRITIN, TIBC, IRON, RETICCTPCT in the last 72 hours.  Coagulation profile No results for input(s): INR, PROTIME in the last 168 hours.  No results for input(s): DDIMER in the last 72 hours.  Cardiac Enzymes No results for input(s): CKMB, TROPONINI, MYOGLOBIN in the last 168 hours.  Invalid input(s): CK ------------------------------------------------------------------------------------------------------------------    Component Value Date/Time   BNP 1,043.9 (H) 11/09/2019 0430    Micro Results Recent Results (from the past 240 hour(s))  Culture, blood (routine x  2)     Status: None   Collection Time: 11/13/19  6:09 PM   Specimen: BLOOD  Result Value Ref Range Status   Specimen Description BLOOD LEFT ANTECUBITAL  Final   Special Requests   Final    BOTTLES DRAWN AEROBIC AND ANAEROBIC Blood Culture adequate volume   Culture   Final    NO GROWTH 5 DAYS Performed at [facility], [address], [place], [place] [address]    Report Status 11/18/2019 FINAL  Final  Culture, blood (routine x 2)     Status: None   Collection Time: 11/13/19  6:16 PM   Specimen: BLOOD LEFT HAND  Result Value Ref Range Status   Specimen Description BLOOD LEFT HAND  Final   Special Requests   Final    BOTTLES DRAWN AEROBIC AND ANAEROBIC Blood Culture adequate volume   Culture   Final    NO GROWTH 5 DAYS Performed at [facility], [place] [address] [place], [place] [address]    Report Status 11/18/2019 FINAL  Final    Radiology Reports CT ABDOMEN PELVIS WO CONTRAST  Result Date: 11/07/2019 CLINICAL DATA:  Generalized weakness and lethargy. Sepsis. Ascites. Cough. COVID positive yesterday. EXAM: CT CHEST, ABDOMEN AND PELVIS WITHOUT CONTRAST TECHNIQUE: Multidetector CT imaging of the chest, abdomen and pelvis was performed following the standard protocol without IV contrast. COMPARISON:  Radiograph yesterday. Abdominopelvic CT 09/15/2019 FINDINGS: CT CHEST FINDINGS Cardiovascular: Right upper extremity PICC with tip in the SVC. Motion artifact partially obscures detailed evaluation. Normal caliber thoracic aorta. Mild atherosclerosis. Mild cardiomegaly. No pericardial effusion. Mediastinum/Nodes: Motion and lack of contrast limits assessment for adenopathy. Small mediastinal lymph nodes not definitively enlarged. Assessment for hilar adenopathy is significantly limited. Esophagus is patulous. No suspicious thyroid nodule. Lungs/Pleura: Moderate right and small left pleural effusion. Adjacent compressive atelectasis. There are multifocal geographic patchy ground-glass  opacities throughout both lungs. Trachea and mainstem bronchi are grossly patent, partially motion obscured. Musculoskeletal: Generalized subcutaneous edema throughout the chest wall. Diffuse degenerative change throughout the thoracic spine. There are no acute or suspicious osseous abnormalities. CT ABDOMEN PELVIS FINDINGS Hepatobiliary: No evidence of focal hepatic abnormality on noncontrast exam. Liver is prominent size spanning 20 cm cranial caudal. Question of nodular hepatic contours. Hyperdense gallbladder contents. No calcified gallstone. Common bile duct is not well-defined. Pancreas: No pancreatic ductal dilatation. No disproportionate peripancreatic edema. Spleen: Normal in size without focal abnormality. Adrenals/Urinary Tract: Adrenal thickening without dominant nodule. Chronic left renal atrophy. Left ureter is nondilated, unchanged 7 mm calcification in the left pelvis equivocally within the ureter. Right kidney appears normal in size. No hydronephrosis or renal calculi. Urinary bladder is distended and unremarkable. Stomach/Bowel: Bowel evaluation is limited in the absence of contrast, presence of ascites, and motion artifact. Nondistended stomach, grossly unremarkable. No small bowel obstruction. Appendix not visualized. Moderate volume of stool throughout the colon. There is mild colonic redundancy. No obvious colonic wall thickening or pericolonic inflammation. Vascular/Lymphatic: Abdominal aorta is normal in caliber. No bulky abdominopelvic adenopathy. Reproductive: Uterus is slightly bulky. Limited assessment for adnexal mass given pelvic ascites. Other: Small to moderate volume abdominopelvic ascites. There is generalized edema of the intra-abdominal fat. Prominent generalized edema of the subcutaneous fat, confluent in the flanks. No free intra-abdominal air. Musculoskeletal: Degenerative change in the lumbar spine. There are no acute or suspicious osseous abnormalities. IMPRESSION: 1.  Multifocal geographic patchy ground-glass opacities throughout both lungs, consistent with COVID-19 pneumonia. 2. Fluid overload. Moderate right and small left pleural effusion. Small to moderate volume abdominopelvic ascites. Generalized subcutaneous edema of the chest and abdomen consistent with third-spacing, confluent  edema in the flanks. 3. Question of nodular hepatic contours, recommend correlation for cirrhosis. 4. Chronic left renal atrophy. Unchanged 7 mm calcification in the left pelvis equivocally within the ureter. No hydronephrosis. Aortic Atherosclerosis (ICD10-I70.0). Electronically Signed   By: Keith Rake M.D.   On: 11/07/2019 23:00   CT CHEST WO CONTRAST  Result Date: 11/07/2019 CLINICAL DATA:  Generalized weakness and lethargy. Sepsis. Ascites. Cough. COVID positive yesterday. EXAM: CT CHEST, ABDOMEN AND PELVIS WITHOUT CONTRAST TECHNIQUE: Multidetector CT imaging of the chest, abdomen and pelvis was performed following the standard protocol without IV contrast. COMPARISON:  Radiograph yesterday. Abdominopelvic CT 09/15/2019 FINDINGS: CT CHEST FINDINGS Cardiovascular: Right upper extremity PICC with tip in the SVC. Motion artifact partially obscures detailed evaluation. Normal caliber thoracic aorta. Mild atherosclerosis. Mild cardiomegaly. No pericardial effusion. Mediastinum/Nodes: Motion and lack of contrast limits assessment for adenopathy. Small mediastinal lymph nodes not definitively enlarged. Assessment for hilar adenopathy is significantly limited. Esophagus is patulous. No suspicious thyroid nodule. Lungs/Pleura: Moderate right and small left pleural effusion. Adjacent compressive atelectasis. There are multifocal geographic patchy ground-glass opacities throughout both lungs. Trachea and mainstem bronchi are grossly patent, partially motion obscured. Musculoskeletal: Generalized subcutaneous edema throughout the chest wall. Diffuse degenerative change throughout the thoracic  spine. There are no acute or suspicious osseous abnormalities. CT ABDOMEN PELVIS FINDINGS Hepatobiliary: No evidence of focal hepatic abnormality on noncontrast exam. Liver is prominent size spanning 20 cm cranial caudal. Question of nodular hepatic contours. Hyperdense gallbladder contents. No calcified gallstone. Common bile duct is not well-defined. Pancreas: No pancreatic ductal dilatation. No disproportionate peripancreatic edema. Spleen: Normal in size without focal abnormality. Adrenals/Urinary Tract: Adrenal thickening without dominant nodule. Chronic left renal atrophy. Left ureter is nondilated, unchanged 7 mm calcification in the left pelvis equivocally within the ureter. Right kidney appears normal in size. No hydronephrosis or renal calculi. Urinary bladder is distended and unremarkable. Stomach/Bowel: Bowel evaluation is limited in the absence of contrast, presence of ascites, and motion artifact. Nondistended stomach, grossly unremarkable. No small bowel obstruction. Appendix not visualized. Moderate volume of stool throughout the colon. There is mild colonic redundancy. No obvious colonic wall thickening or pericolonic inflammation. Vascular/Lymphatic: Abdominal aorta is normal in caliber. No bulky abdominopelvic adenopathy. Reproductive: Uterus is slightly bulky. Limited assessment for adnexal mass given pelvic ascites. Other: Small to moderate volume abdominopelvic ascites. There is generalized edema of the intra-abdominal fat. Prominent generalized edema of the subcutaneous fat, confluent in the flanks. No free intra-abdominal air. Musculoskeletal: Degenerative change in the lumbar spine. There are no acute or suspicious osseous abnormalities. IMPRESSION: 1. Multifocal geographic patchy ground-glass opacities throughout both lungs, consistent with COVID-19 pneumonia. 2. Fluid overload. Moderate right and small left pleural effusion. Small to moderate volume abdominopelvic ascites. Generalized  subcutaneous edema of the chest and abdomen consistent with third-spacing, confluent edema in the flanks. 3. Question of nodular hepatic contours, recommend correlation for cirrhosis. 4. Chronic left renal atrophy. Unchanged 7 mm calcification in the left pelvis equivocally within the ureter. No hydronephrosis. Aortic Atherosclerosis (ICD10-I70.0). Electronically Signed   By: Keith Rake M.D.   On: 11/07/2019 23:00   DG CHEST PORT 1 VIEW  Result Date: 11/16/2019 CLINICAL DATA:  Shortness of breath.  COVID-19 positive EXAM: PORTABLE CHEST 1 VIEW COMPARISON:  Nov 09, 2019 FINDINGS: Central catheter tip is in the superior vena cava. No pneumothorax. Airspace opacity is noted bilaterally, more extensive on the right than on the left. There has been slight interval clearing on the left without appreciable  change on the right compared to most recent study. This opacity is primarily in a perihilar distribution bilaterally. There is a small layering right pleural effusion. There is cardiomegaly with pulmonary venous hypertension. No adenopathy. No bone lesions. IMPRESSION: Cardiomegaly with pulmonary vascular congestion. Airspace opacity is noted bilaterally, more on the right than on the left with partial clearing from the left upper lobe and left base compared to previous study. No change on the right. Small right pleural effusion. Suspect multifocal pneumonia, although there may well be superimposed pulmonary edema. Question both pneumonia and congestive heart failure concurrent. Electronically Signed   By: Lowella Grip III M.D.   On: 11/16/2019 09:49   DG Chest Port 1 View  Result Date: 11/09/2019 CLINICAL DATA:  Weakness, COVID-19 positive EXAM: PORTABLE CHEST 1 VIEW COMPARISON:  Chest radiograph from one day prior. FINDINGS: Right PICC terminates in the lower third of the SVC. Stable cardiomediastinal silhouette with mild cardiomegaly. No pneumothorax. No pleural effusion. Extensive patchy opacities  throughout both lungs, slightly worsened in the upper lungs. IMPRESSION: Extensive patchy opacities throughout both lungs, slightly worsened in the upper lungs, compatible with COVID-19 pneumonia, although with component of pulmonary edema not excluded. Mild cardiomegaly. Electronically Signed   By: Ilona Sorrel M.D.   On: 11/09/2019 08:19   DG Chest Port 1 View  Result Date: 11/08/2019 CLINICAL DATA:  Shortness of breath, COVID-19, diabetes mellitus, hypertension, stage III chronic kidney disease, history endometrial cancer EXAM: PORTABLE CHEST 1 VIEW COMPARISON:  Portable exam 0816 hours compared to 11/06/2019 FINDINGS: RIGHT arm PICC line tip projects over SVC. Enlargement of cardiac silhouette with stable mediastinal contours. Extensive BILATERAL pulmonary infiltrates greatest perihilar on RIGHT, question asymmetric pulmonary edema versus multifocal infection. No pleural effusion or pneumothorax. Bones demineralized with scattered degenerative changes of the thoracic spine. IMPRESSION: Enlargement of cardiac silhouette with increased BILATERAL pulmonary infiltrates greatest RIGHT perihilar question asymmetric pulmonary edema versus multifocal pneumonia. Electronically Signed   By: Lavonia Dana M.D.   On: 11/08/2019 08:51   DG CHEST PORT 1 VIEW  Result Date: 11/06/2019 CLINICAL DATA:  Weakness. EXAM: PORTABLE CHEST 1 VIEW COMPARISON:  November 05, 2019. FINDINGS: Stable cardiomegaly with possible central pulmonary vascular congestion. No pneumothorax is noted. Mild bibasilar atelectasis is noted with small pleural effusions. Bony thorax is unremarkable. IMPRESSION: Stable cardiomegaly with possible central pulmonary vascular congestion. Mild bibasilar subsegmental atelectasis is noted with small pleural effusions. Electronically Signed   By: Marijo Conception M.D.   On: 11/06/2019 15:57   DG Chest Port 1 View  Result Date: 11/05/2019 CLINICAL DATA:  Weakness. Found on floor today. EXAM: PORTABLE CHEST 1  VIEW COMPARISON:  10/18/2019 FINDINGS: Stable heart size and mediastinal contours with mild cardiomegaly. Hazy opacity at both lung bases likely combination of pleural fluid and airspace disease/atelectasis. Vascular congestion. No pneumothorax. Left proximal humerus fracture appears subacute with surrounding heterotopic ossification, also seen on 09/17/2019 shoulder radiograph. IMPRESSION: 1. Hazy opacity at both lung bases likely combination of pleural fluid and airspace disease/atelectasis. This is stable on the right but progressive on the left from prior. 2. Stable mild cardiomegaly.  Vascular congestion. Electronically Signed   By: Keith Rake M.D.   On: 11/05/2019 17:02   VAS [place] LOWER EXTREMITY VENOUS (DVT)  Result Date: 11/07/2019  Lower Venous DVTStudy Indications: Swelling, and Edema.  Limitations: Body habitus and poor ultrasound/tissue interface. Comparison Study: 09/16/19 previous Performing Technologist: Abram Sander RVS  Examination Guidelines: A complete evaluation includes B-mode imaging, spectral Doppler,  color Doppler, and power Doppler as needed of all accessible portions of each vessel. Bilateral testing is considered an integral part of a complete examination. Limited examinations for reoccurring indications may be performed as noted. The reflux portion of the exam is performed with the patient in reverse Trendelenburg.  +---------+---------------+---------+-----------+----------+--------------+ RIGHT    CompressibilityPhasicitySpontaneityPropertiesThrombus Aging +---------+---------------+---------+-----------+----------+--------------+ CFV      Full           Yes      Yes                                 +---------+---------------+---------+-----------+----------+--------------+ SFJ      Full                                                        +---------+---------------+---------+-----------+----------+--------------+ FV Prox  Full                                                         +---------+---------------+---------+-----------+----------+--------------+ FV Mid                                                Not visualized +---------+---------------+---------+-----------+----------+--------------+ FV Distal               Yes      Yes                                 +---------+---------------+---------+-----------+----------+--------------+ PFV      Full                                                        +---------+---------------+---------+-----------+----------+--------------+ POP      Full           Yes      Yes                                 +---------+---------------+---------+-----------+----------+--------------+ PTV                                                   Not visualized +---------+---------------+---------+-----------+----------+--------------+ PERO                                                  Not visualized +---------+---------------+---------+-----------+----------+--------------+   +---------+---------------+---------+-----------+----------+--------------+ LEFT     CompressibilityPhasicitySpontaneityPropertiesThrombus Aging +---------+---------------+---------+-----------+----------+--------------+ CFV      Full  Yes      Yes                                 +---------+---------------+---------+-----------+----------+--------------+ SFJ      Full                                                        +---------+---------------+---------+-----------+----------+--------------+ FV Prox  Full                                                        +---------+---------------+---------+-----------+----------+--------------+ FV Mid                  Yes      Yes                                 +---------+---------------+---------+-----------+----------+--------------+ FV Distal               Yes      Yes                                  +---------+---------------+---------+-----------+----------+--------------+ PFV      Full                                                        +---------+---------------+---------+-----------+----------+--------------+ POP      Full           Yes      Yes                                 +---------+---------------+---------+-----------+----------+--------------+ PTV      Full                                                        +---------+---------------+---------+-----------+----------+--------------+ PERO                                                  Not visualized +---------+---------------+---------+-----------+----------+--------------+     Summary: BILATERAL: - No evidence of deep vein thrombosis seen in the lower extremities, bilaterally.   *See table(s) above for measurements and observations. Electronically signed by Curt Jews MD on 11/07/2019 at 4:26:57 PM.    Final    [place] EKG SITE RITE  Result Date: 11/07/2019 If Site Rite image not attached, placement could not be confirmed due to current cardiac rhythm.  US Abdomen Limited RUQ  Result Date: 11/16/2019 CLINICAL DATA:  Cirrhosis EXAM: ULTRASOUND  ABDOMEN LIMITED RIGHT UPPER QUADRANT COMPARISON:  11/07/2019 FINDINGS: Gallbladder: The gallbladder wall is thickened measuring approximately 8.6 mm in thickness. The sonographic Murphy sign cannot be adequately assessed. There is gallbladder sludge. There is some mild pericholecystic free fluid. Common bile duct: Diameter: 6 mm Liver: The liver surface is nodular. There is no discrete hepatic mass. Portal vein is patent on color Doppler imaging with normal direction of blood flow towards the liver. Other: There is a small volume of ascites in the upper abdomen. There is right-sided pelviectasis. The right kidney is echogenic. IMPRESSION: 1. There is gallbladder sludge with associated gallbladder wall thickening. The sonographic Murphy sign cannot be adequately assessed. If  there is high clinical suspicion for acute cholecystitis, follow-up with HIDA scan is recommended. 2. Nodular appearance of the liver which can be seen in patients with cirrhosis. There is no discrete hepatic mass. 3. Echogenic right kidney suggestive of underlying medical renal disease. 4. Right-sided pelviectasis of unknown clinical significance. This appears to be new since the patient's prior CT dated 11/07/2019 5. Small volume abdominal ascites. Electronically Signed   By: Constance Holster M.D.   On: 11/16/2019 17:14     Phillips Climes M.D on 11/19/2019 at 12:51 PM  To page go to [email or web address] - password [facility]

## 2019-11-19 NOTE — Progress Notes (Deleted)
OT Cancellation Note  Patient Details Name: Vanessa [name] MRN: [id number] DOB: [birth year]   Cancelled Treatment:    Reason Eval/Treat Not Completed: Medical issues which prohibited therapy.  Patient has critically low hemoglobin level and receiving blood.  Will check back once more stable for therapy.  August Luz, OTR/L   Phylliss Bob 11/19/2019, 2:00 PM

## 2019-11-19 NOTE — Progress Notes (Signed)
   11/19/19 0800  Assess: MEWS Score  Temp (!) 96.8 F (36 C)  BP (!) 165/73  Pulse Rate 73  ECG Heart Rate 73  Resp (!) 22  SpO2 96 %  Assess: MEWS Score  MEWS Temp 1  MEWS Systolic 0  MEWS Pulse 0  MEWS RR 1  MEWS LOC 0  MEWS Score 2  MEWS Score Color Yellow  Assess: if the MEWS score is Yellow or Red  Were vital signs taken at a resting state? Yes  Focused Assessment Documented focused assessment  Early Detection of Sepsis Score *See Row Information* Low  MEWS guidelines implemented *See Row Information* Yes  Treat  MEWS Interventions Administered scheduled meds/treatments  Take Vital Signs  Increase Vital Sign Frequency  Yellow: Q 2hr X 2 then Q 4hr X 2, if remains yellow, continue Q 4hrs  Escalate  MEWS: Escalate Yellow: discuss with charge nurse/RN and consider discussing with provider and RRT  Notify: Charge Nurse/RN  Name of Charge Nurse/RN Notified Patrici Ranks RN  Date Charge Nurse/RN Notified 11/19/19  Time Charge Nurse/RN Notified 0815    Pts Mews continuously between green and yellow, Yellow Mews protocol initiated

## 2019-11-19 NOTE — Plan of Care (Signed)
Patient back in bed. Bear hugger D/C'ed per order of Temp >35 sustained. No change in patient status. NAD noted.

## 2019-11-19 NOTE — Progress Notes (Signed)
Hypoglycemic Event  CBG: 64  Treatment: 8 oz juice/soda  Symptoms: None  Follow-up CBG: Time:1222 CBG Result:125  Possible Reasons for Event: Unknown  1200 insulin held    Luci Bank

## 2019-11-19 NOTE — Progress Notes (Signed)
  Speech Language Pathology Treatment: Dysphagia  Patient Details Name: Vanessa [name] MRN: [id number] DOB: Nov 19, 1955 Today's Date: 11/19/2019 Time: 6691-6756 SLP Time Calculation (min) (ACUTE ONLY): 23 min  Assessment / Plan / Recommendation Clinical Impression  Skilled dysphagia treatment seen with lunch tray to for diagnostic treatment over extended amount of time. Pt exhibited coughing yesterday during swallow assessment that was difficult to distinguish from baseline cough with current Covid dx. Pt observed with Dys 2 (minced texture) which she prefers given her missing and loose dentition. Mastication appeared timely and efficient and from pharyngeal standpoint she did not cough or throat clear during meal. She prefers to drink from cup and recalled her esophageal precautions as well as general strategies she utilizes. Pt stated she would like to continue on Dys 2 texture; continue thin, pills whole in puree and ST will sign off at this time.   HPI HPI: Vanessa [name] is a 64 y.o. female with medical history significant of hypertension, diabetes, hyperlipidemia, grade 2 diastolic congestive heart failure, CKD stage IIIb, endometrial cancer presents to emergency department due to generalized weakness and lethargy, she was diagnosed with COVID-19 pneumonia with sepsis and hypothermia.She has completely recovered from COVID-19 pneumonia her only issue now is persistent hypothermia of unclear etiology, poor oral intake. History of esophageal dysphagia.      SLP Plan  All goals met;Discharge SLP treatment due to (comment)       Recommendations  Diet recommendations: Dysphagia 2 (fine chop);Thin liquid Liquids provided via: Cup;No straw Medication Administration: Whole meds with puree Supervision: Patient able to self feed Compensations: Slow rate;Small sips/bites;Follow solids with liquid Postural Changes and/or Swallow Maneuvers: Seated upright 90 degrees;Upright 30-60 min after meal                 Oral Care Recommendations: Oral care BID SLP Visit Diagnosis: Dysphagia, oropharyngeal phase (R13.12) Plan: All goals met;Discharge SLP treatment due to (comment)                       [name] 11/19/2019, 2:23 PM  Orbie Pyo Colvin Caroli.Ed Risk analyst [phone] Office [phone]

## 2019-11-20 DIAGNOSIS — C541 Malignant neoplasm of endometrium: Secondary | ICD-10-CM

## 2019-11-20 LAB — GLUCOSE, CAPILLARY
Glucose-Capillary: 139 mg/dL — ABNORMAL HIGH (ref 70–99)
Glucose-Capillary: 122 mg/dL — ABNORMAL HIGH (ref 70–99)
Glucose-Capillary: 91 mg/dL (ref 70–99)
Glucose-Capillary: 137 mg/dL — ABNORMAL HIGH (ref 70–99)

## 2019-11-20 LAB — CBC
HCT: 28.2 % — ABNORMAL LOW (ref 36.0–46.0)
Hemoglobin: 8.5 g/dL — ABNORMAL LOW (ref 12.0–15.0)
MCH: 29.4 pg (ref 26.0–34.0)
MCHC: 30.1 g/dL (ref 30.0–36.0)
MCV: 97.6 fL (ref 80.0–100.0)
Platelets: 190 10*3/uL (ref 150–400)
RBC: 2.89 MIL/uL — ABNORMAL LOW (ref 3.87–5.11)
RDW: 18.6 % — ABNORMAL HIGH (ref 11.5–15.5)
WBC: 6.1 10*3/uL (ref 4.0–10.5)
nRBC: 0 % (ref 0.0–0.2)

## 2019-11-20 LAB — BASIC METABOLIC PANEL
Anion gap: 7 (ref 5–15)
BUN: 34 mg/dL — ABNORMAL HIGH (ref 8–23)
CO2: 29 mmol/L (ref 22–32)
Calcium: 9 mg/dL (ref 8.9–10.3)
Chloride: 107 mmol/L (ref 98–111)
Creatinine, Ser: 1.66 mg/dL — ABNORMAL HIGH (ref 0.44–1.00)
GFR calc Af Amer: 37 mL/min — ABNORMAL LOW (ref >60)
GFR calc non Af Amer: 32 mL/min — ABNORMAL LOW (ref >60)
Glucose, Bld: 166 mg/dL — ABNORMAL HIGH (ref 70–99)
Potassium: 3.9 mmol/L (ref 3.5–5.1)
Sodium: 143 mmol/L (ref 135–145)

## 2019-11-20 MED ORDER — FUROSEMIDE 40 MG PO TABS
40.0000 mg | ORAL_TABLET | Freq: Every day | ORAL | Status: DC
  Administered 2019-11-20 – 2019-11-22 (×3): 40 mg via ORAL
  Filled 2019-11-20 (×3): qty 1

## 2019-11-20 MED ORDER — SPIRONOLACTONE 25 MG PO TABS
50.0000 mg | ORAL_TABLET | Freq: Every day | ORAL | Status: DC
  Administered 2019-11-21 – 2019-11-22 (×2): 50 mg via ORAL
  Filled 2019-11-20 (×2): qty 2

## 2019-11-20 MED ORDER — INSULIN ASPART 100 UNIT/ML ~~LOC~~ SOLN
0.0000 [IU] | Freq: Three times a day (TID) | SUBCUTANEOUS | Status: DC
  Administered 2019-11-21 – 2019-11-22 (×4): 1 [IU] via SUBCUTANEOUS

## 2019-11-20 NOTE — Progress Notes (Signed)
PROGRESS NOTE                                                                                                                                                                                                             Patient Demographics:    Vanessa [name], is a 64 y.o. female, DOB - 15-Aug-1955, YQM:[id number]  Outpatient Primary MD for the patient is Maren Reamer, MD (Inactive)   Admit date - 11/05/2019   LOS - 14  No chief complaint on file.      Brief Narrative: Patient is a 64 y.o. female with PMHx of HTN, DM, HLD, chronic diastolic heart failure, CKD stage IIIb, endometrial cancer, recent history of GI bleeding and MSSA bacteremia finished a course of IV antibiotics-who presented to the hospital on 4/27 with weakness, fever-found to have sepsis secondary to COVID-19 pneumonia.  Hospital course complicated by persistent hypothermia.  Significant Events: 4/27>> admit for weakness/fever-sepsis secondary to COVID-19 pneumonia.  COVID-19 medications: Steroids: 4/29>> 5/2 Remdesivir: 4/29>> 5/3  Antibiotics: Vancomycin: 4/29>> 5/1 Zosyn: 4/29>> 5/2 Doxycycline: 5/3>> 5/5 Flagyl: 5/7>>  Microbiology data: 4/28: Blood culture>>  negative 5/5: Blood culture>> negative  Significant studies: 4/29 CT chest>> patchy groundglass opacities, fluid overload, moderate right and small left pleural effusion 4/29 CT abdomen/pelvis>> small volume ascites, nodular hepatic contour, chronic left renal atrophy 4/29 lower extremity Doppler: No evidence of DVT. 5/8 Limited abdominal ultrasound: Gallbladder sludge with gallbladder wall thickening, nodular appearance of the liver, echogenic right kidney suggestive of medical renal disease.  Procedures: None  Consults: None  DVT prophylaxis: SQ heparin    Subjective:    Vanessa [name] today was sitting at bedside chair-no major issues overnight.  Continues to have mild but  asymptomatic hypothermia.   Assessment  & Plan :   Sepsis: Etiology unclear-she recently had MSSA bacteremia-and has COVID-19 pneumonia.  Sepsis physiology seems to have improved with supportive care   Fever: afebrile  O2 requirements:  SpO2: 96 % O2 Flow Rate (L/min): 1 L/min   COVID-19 Labs: No results for input(s): DDIMER, FERRITIN, LDH, CRP in the last 72 hours.     Component Value Date/Time   BNP 1,043.9 (H) 11/09/2019 0430    Recent Labs  Lab 11/13/19 1730  PROCALCITON 0.25    Lab Results  Component Value Date   SARSCOV2NAA POSITIVE (A) 11/06/2019  [place] NEGATIVE 10/18/2019   [place] NEGATIVE 10/09/2019   [place] NEGATIVE 10/04/2019    Hypothermia: Appears to be mild-she is completely asymptomatic.  TSH within normal limits-random cortisol appears appropriate.  Blood cultures negative so far.  Unclear why she remains persistently hypothermic-she is completely asymptomatic-could have reset thermostat as well.  Continue supportive care.  Given the fact that she is asymptomatic-and no obvious etiology has been found-suspect that she can be monitored closely at SNF, and does not need in-hospital monitoring for hypothermia.  Plan discussed with spouse-he agrees.  We will go ahead and discontinue Foley catheter today (place for core temperature measurement).  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 Berline Lopes post hospital discharge.  History of trichomoniasis: Apparently was supposed to be started on Flagyl prior to this hospital stay-but never did-currently on Flagyl-x7 days.  History of recent GI bleeding with duodenal ulcer: No evidence of recurrence-remains on PPI.  HTN: Controlled-continue Norvasc and doxazosin  CT/ultrasound evidence of liver cirrhosis: Probably related to NASH-further work-up can be deferred to the outpatient setting.  However has significant lower extremity edema-hence will restart Lasix-and increase  Aldactone to 50 mg daily.  CKD stage IIIb: Creatinine close to usual baseline-follow.  HTN: Remains uncontrolled-continue clonidine, hydralazine, Cardura, amlodipine-adding Aldactone/Lasix today.  Follow.  Chronic lower extremity edema: Mostly nonpitting-appears to be a chronic issue per patient-resuming Lasix and Aldactone.  DM-2 (A1c 5.7 on 09/15/2019): CBG stable today-but did have episode of hypoglycemia yesterday-stop nightly correction scale-change to sensitive scale.  Given significant underlying medical comorbidities-not a candidate for aggressive glycemic control.  CBG (last 3)  Recent Labs    11/19/19 2050 11/20/19 0732 11/20/19 1143  GLUCAP 134* 139* 122*    Obesity: Estimated body mass index is 40.6 kg/m as calculated from the following:   Height as of this encounter: 5' (1.524 m).   Weight as of this encounter: 94.3 kg.    ABG: No results found for: PHART, PCO2ART, PO2ART, HCO3, TCO2, ACIDBASEDEF, O2SAT  Vent Settings: N/A  Condition -Stable  Family Communication  :  Spouse over the phone on 5/12  Code Status :  Full Code  Diet :  Diet Order            DIET DYS 2 Room service appropriate? Yes; Fluid consistency: Thin  Diet effective now               Disposition Plan  :  Status is: Inpatient  Remains inpatient appropriate because:Inpatient level of care appropriate due to severity of illness   Dispo: The patient is from: Home              Anticipated d/c is to: SNF              Anticipated d/c date is: 1 day              Patient currently is not medically stable to d/c.   Barriers to discharge: Hypothermia  Antimicorbials  :    Anti-infectives (From admission, onward)   Start     Dose/Rate Route Frequency Ordered Stop   11/15/19 1600  metroNIDAZOLE (FLAGYL) tablet 500 mg     500 mg Oral Every 8 hours 11/15/19 1520     11/11/19 1000  doxycycline (VIBRA-TABS) tablet 100 mg  Status:  Discontinued     100 mg Oral Every 12 hours 11/11/19 0729  11/12/19 0921   11/09/19 1230  vancomycin (VANCOREADY) IVPB 1250 mg/250 mL  Status:  Discontinued  1,250 mg 166.7 mL/hr over 90 Minutes Intravenous Every 48 hours 11/07/19 1100 11/09/19 1318   11/08/19 1000  remdesivir 100 mg in sodium chloride 0.9 % 100 mL IVPB     100 mg 200 mL/hr over 30 Minutes Intravenous Daily 11/07/19 1217 11/11/19 1054   11/07/19 1600  piperacillin-tazobactam (ZOSYN) IVPB 3.375 g  Status:  Discontinued     3.375 g 12.5 mL/hr over 240 Minutes Intravenous Every 8 hours 11/07/19 1033 11/11/19 0729   11/07/19 1245  remdesivir 200 mg in sodium chloride 0.9% 250 mL IVPB     200 mg 580 mL/hr over 30 Minutes Intravenous Once 11/07/19 1216 11/07/19 1728   11/07/19 1130  vancomycin (VANCOREADY) IVPB 2000 mg/400 mL     2,000 mg 200 mL/hr over 120 Minutes Intravenous  Once 11/07/19 1053 11/07/19 1432   11/07/19 1100  piperacillin-tazobactam (ZOSYN) IVPB 3.375 g     3.375 g 100 mL/hr over 30 Minutes Intravenous  Once 11/07/19 1033 11/07/19 1303      Inpatient Medications  Scheduled Meds: . albuterol  2 puff Inhalation Q4H  . amLODipine  10 mg Oral Daily  . vitamin C  500 mg Oral Daily  . Chlorhexidine Gluconate Cloth  6 each Topical Daily  . cloNIDine  0.2 mg Oral BID  . doxazosin  4 mg Oral Daily  . feeding supplement (ENSURE ENLIVE)  237 mL Oral TID BM  . feeding supplement (PRO-STAT SUGAR FREE 64)  30 mL Oral TID WC  . ferrous sulfate  325 mg Oral TID WC  . heparin injection (subcutaneous)  5,000 Units Subcutaneous Q8H  . hydrALAZINE  100 mg Oral Q8H  . hydrochlorothiazide  12.5 mg Oral Daily  . insulin aspart  0-15 Units Subcutaneous TID WC  . insulin aspart  0-5 Units Subcutaneous QHS  . mouth rinse  15 mL Mouth Rinse BID  . metroNIDAZOLE  500 mg Oral Q8H  . pantoprazole sodium  40 mg Oral BID  . polyethylene glycol  17 g Oral Daily  . spironolactone  25 mg Oral Daily  . zinc sulfate  220 mg Oral Daily   Continuous Infusions: . dextrose 100 mL/hr at  11/20/19 0431   PRN Meds:.acetaminophen, capsaicin, guaiFENesin-dextromethorphan, loratadine, [DISCONTINUED] ondansetron **OR** ondansetron (ZOFRAN) IV, sodium chloride flush   Time Spent in minutes  25  See all Orders from today for further details   Oren Binet M.D on 11/20/2019 at 1:08 PM  To page go to [email or web address] - use universal password  [facility] -  Office  [phone]    Objective:   Vitals:   11/20/19 0433 11/20/19 0800 11/20/19 0845 11/20/19 1200  BP:  (!) 165/90  (!) 163/84  Pulse:  79  73  Resp:  16  20  Temp:  (!) 94.5 F (34.7 C) (!) 94.7 F (34.8 C) (!) 94.1 F (34.5 C)  TempSrc:  Bladder Oral Bladder  SpO2:  93%  96%  Weight: 94.3 kg     Height:        Wt Readings from Last 3 Encounters:  11/20/19 94.3 kg  10/18/19 100.3 kg  10/17/19 94.9 kg     Intake/Output Summary (Last 24 hours) at 11/20/2019 1308 Last data filed at 11/20/2019 1200 Gross per 24 hour  Intake 2413.32 ml  Output 2500 ml  Net -86.68 ml     Physical Exam Gen Exam:Alert awake-not in any distress HEENT:atraumatic, normocephalic Chest: B/L clear to auscultation anteriorly CVS:S1S2 regular Abdomen:soft non tender, non distended Extremities:++  Edema Neurology: Non focal Skin: no rash   Data Review:    CBC Recent Labs  Lab 11/14/19 0259 11/15/19 0206 11/16/19 0330 11/17/19 0424 11/18/19 0328 11/19/19 0255 11/20/19 0459  WBC 5.0   < > 6.9 6.3 6.1 7.3 6.1  HGB 8.9*   < > 8.1* 8.4* 8.2* 8.2* 8.5*  HCT 29.5*   < > 28.0* 28.8* 27.9* 27.3* 28.2*  PLT 101*   < > 128* 135* 156 165 190  MCV 95.8   < > 98.2 98.3 98.6 97.8 97.6  MCH 28.9   < > 28.4 28.7 29.0 29.4 29.4  MCHC 30.2   < > 28.9* 29.2* 29.4* 30.0 30.1  RDW 18.2*   < > 18.9* 18.7* 18.9* 18.9* 18.6*  LYMPHSABS 0.4*  --   --   --   --   --   --   MONOABS 0.2  --   --   --   --   --   --   EOSABS 0.0  --   --   --   --   --   --   BASOSABS 0.0  --   --   --   --   --   --    < > = values in this  interval not displayed.    Chemistries  Recent Labs  Lab 11/14/19 0259 11/14/19 0259 11/15/19 0206 11/15/19 0206 11/16/19 0330 11/17/19 0424 11/18/19 0328 11/19/19 0255 11/20/19 0459  NA 147*   < > 150*   < > 153* 153* 152* 149* 143  K 3.7   < > 3.8   < > 3.7 3.6 3.5 3.6 3.9  CL 113*   < > 115*   < > 114* 113* 114* 112* 107  CO2 25   < > 28   < > 29 29 30 29 29   GLUCOSE 138*   < > 148*   < > 119* 125* 134* 149* 166*  BUN 48*   < > 47*   < > 48* 44* 42* 38* 34*  CREATININE 1.67*   < > 1.68*   < > 1.71* 1.63* 1.70* 1.67* 1.66*  CALCIUM 8.0*   < > 8.1*   < > 8.6* 8.9 9.0 8.9 9.0  MG 1.8  --   --   --   --   --   --   --   --   AST 12*   < > 11*  --  10* 10* 11* 12*  --   ALT 6   < > 6  --  6 6 6 8   --   ALKPHOS 95   < > 93  --  87 79 86 81  --   BILITOT 0.8   < > 0.8  --  0.8 1.0 0.7 1.0  --    < > = values in this interval not displayed.   ------------------------------------------------------------------------------------------------------------------ No results for input(s): CHOL, HDL, LDLCALC, TRIG, CHOLHDL, LDLDIRECT in the last 72 hours.  Lab Results  Component Value Date   HGBA1C 5.7 (H) 09/15/2019   ------------------------------------------------------------------------------------------------------------------ No results for input(s): TSH, T4TOTAL, T3FREE, THYROIDAB in the last 72 hours.  Invalid input(s): FREET3 ------------------------------------------------------------------------------------------------------------------ No results for input(s): VITAMINB12, FOLATE, FERRITIN, TIBC, IRON, RETICCTPCT in the last 72 hours.  Coagulation profile No results for input(s): INR, PROTIME in the last 168 hours.  No results for input(s): DDIMER in the last 72 hours.  Cardiac Enzymes No results for input(s): CKMB, TROPONINI, MYOGLOBIN  in the last 168 hours.  Invalid input(s):  CK ------------------------------------------------------------------------------------------------------------------    Component Value Date/Time   BNP 1,043.9 (H) 11/09/2019 0430    Micro Results Recent Results (from the past 240 hour(s))  Culture, blood (routine x 2)     Status: None   Collection Time: 11/13/19  6:09 PM   Specimen: BLOOD  Result Value Ref Range Status   Specimen Description BLOOD LEFT ANTECUBITAL  Final   Special Requests   Final    BOTTLES DRAWN AEROBIC AND ANAEROBIC Blood Culture adequate volume   Culture   Final    NO GROWTH 5 DAYS Performed at [facility], [address], [place], [place] [address]    Report Status 11/18/2019 FINAL  Final  Culture, blood (routine x 2)     Status: None   Collection Time: 11/13/19  6:16 PM   Specimen: BLOOD LEFT HAND  Result Value Ref Range Status   Specimen Description BLOOD LEFT HAND  Final   Special Requests   Final    BOTTLES DRAWN AEROBIC AND ANAEROBIC Blood Culture adequate volume   Culture   Final    NO GROWTH 5 DAYS Performed at [facility], [place] [address], [place], [place] [address]    Report Status 11/18/2019 FINAL  Final    Radiology Reports CT ABDOMEN PELVIS WO CONTRAST  Result Date: 11/07/2019 CLINICAL DATA:  Generalized weakness and lethargy. Sepsis. Ascites. Cough. COVID positive yesterday. EXAM: CT CHEST, ABDOMEN AND PELVIS WITHOUT CONTRAST TECHNIQUE: Multidetector CT imaging of the chest, abdomen and pelvis was performed following the standard protocol without IV contrast. COMPARISON:  Radiograph yesterday. Abdominopelvic CT 09/15/2019 FINDINGS: CT CHEST FINDINGS Cardiovascular: Right upper extremity PICC with tip in the SVC. Motion artifact partially obscures detailed evaluation. Normal caliber thoracic aorta. Mild atherosclerosis. Mild cardiomegaly. No pericardial effusion. Mediastinum/Nodes: Motion and lack of contrast limits assessment for adenopathy. Small mediastinal lymph nodes not  definitively enlarged. Assessment for hilar adenopathy is significantly limited. Esophagus is patulous. No suspicious thyroid nodule. Lungs/Pleura: Moderate right and small left pleural effusion. Adjacent compressive atelectasis. There are multifocal geographic patchy ground-glass opacities throughout both lungs. Trachea and mainstem bronchi are grossly patent, partially motion obscured. Musculoskeletal: Generalized subcutaneous edema throughout the chest wall. Diffuse degenerative change throughout the thoracic spine. There are no acute or suspicious osseous abnormalities. CT ABDOMEN PELVIS FINDINGS Hepatobiliary: No evidence of focal hepatic abnormality on noncontrast exam. Liver is prominent size spanning 20 cm cranial caudal. Question of nodular hepatic contours. Hyperdense gallbladder contents. No calcified gallstone. Common bile duct is not well-defined. Pancreas: No pancreatic ductal dilatation. No disproportionate peripancreatic edema. Spleen: Normal in size without focal abnormality. Adrenals/Urinary Tract: Adrenal thickening without dominant nodule. Chronic left renal atrophy. Left ureter is nondilated, unchanged 7 mm calcification in the left pelvis equivocally within the ureter. Right kidney appears normal in size. No hydronephrosis or renal calculi. Urinary bladder is distended and unremarkable. Stomach/Bowel: Bowel evaluation is limited in the absence of contrast, presence of ascites, and motion artifact. Nondistended stomach, grossly unremarkable. No small bowel obstruction. Appendix not visualized. Moderate volume of stool throughout the colon. There is mild colonic redundancy. No obvious colonic wall thickening or pericolonic inflammation. Vascular/Lymphatic: Abdominal aorta is normal in caliber. No bulky abdominopelvic adenopathy. Reproductive: Uterus is slightly bulky. Limited assessment for adnexal mass given pelvic ascites. Other: Small to moderate volume abdominopelvic ascites. There is  generalized edema of the intra-abdominal fat. Prominent generalized edema of the subcutaneous fat, confluent in the flanks.  No free intra-abdominal air. Musculoskeletal: Degenerative change in the lumbar spine. There are no acute or suspicious osseous abnormalities. IMPRESSION: 1. Multifocal geographic patchy ground-glass opacities throughout both lungs, consistent with COVID-19 pneumonia. 2. Fluid overload. Moderate right and small left pleural effusion. Small to moderate volume abdominopelvic ascites. Generalized subcutaneous edema of the chest and abdomen consistent with third-spacing, confluent edema in the flanks. 3. Question of nodular hepatic contours, recommend correlation for cirrhosis. 4. Chronic left renal atrophy. Unchanged 7 mm calcification in the left pelvis equivocally within the ureter. No hydronephrosis. Aortic Atherosclerosis (ICD10-I70.0). Electronically Signed   By: Keith Rake M.D.   On: 11/07/2019 23:00   CT CHEST WO CONTRAST  Result Date: 11/07/2019 CLINICAL DATA:  Generalized weakness and lethargy. Sepsis. Ascites. Cough. COVID positive yesterday. EXAM: CT CHEST, ABDOMEN AND PELVIS WITHOUT CONTRAST TECHNIQUE: Multidetector CT imaging of the chest, abdomen and pelvis was performed following the standard protocol without IV contrast. COMPARISON:  Radiograph yesterday. Abdominopelvic CT 09/15/2019 FINDINGS: CT CHEST FINDINGS Cardiovascular: Right upper extremity PICC with tip in the SVC. Motion artifact partially obscures detailed evaluation. Normal caliber thoracic aorta. Mild atherosclerosis. Mild cardiomegaly. No pericardial effusion. Mediastinum/Nodes: Motion and lack of contrast limits assessment for adenopathy. Small mediastinal lymph nodes not definitively enlarged. Assessment for hilar adenopathy is significantly limited. Esophagus is patulous. No suspicious thyroid nodule. Lungs/Pleura: Moderate right and small left pleural effusion. Adjacent compressive atelectasis. There are  multifocal geographic patchy ground-glass opacities throughout both lungs. Trachea and mainstem bronchi are grossly patent, partially motion obscured. Musculoskeletal: Generalized subcutaneous edema throughout the chest wall. Diffuse degenerative change throughout the thoracic spine. There are no acute or suspicious osseous abnormalities. CT ABDOMEN PELVIS FINDINGS Hepatobiliary: No evidence of focal hepatic abnormality on noncontrast exam. Liver is prominent size spanning 20 cm cranial caudal. Question of nodular hepatic contours. Hyperdense gallbladder contents. No calcified gallstone. Common bile duct is not well-defined. Pancreas: No pancreatic ductal dilatation. No disproportionate peripancreatic edema. Spleen: Normal in size without focal abnormality. Adrenals/Urinary Tract: Adrenal thickening without dominant nodule. Chronic left renal atrophy. Left ureter is nondilated, unchanged 7 mm calcification in the left pelvis equivocally within the ureter. Right kidney appears normal in size. No hydronephrosis or renal calculi. Urinary bladder is distended and unremarkable. Stomach/Bowel: Bowel evaluation is limited in the absence of contrast, presence of ascites, and motion artifact. Nondistended stomach, grossly unremarkable. No small bowel obstruction. Appendix not visualized. Moderate volume of stool throughout the colon. There is mild colonic redundancy. No obvious colonic wall thickening or pericolonic inflammation. Vascular/Lymphatic: Abdominal aorta is normal in caliber. No bulky abdominopelvic adenopathy. Reproductive: Uterus is slightly bulky. Limited assessment for adnexal mass given pelvic ascites. Other: Small to moderate volume abdominopelvic ascites. There is generalized edema of the intra-abdominal fat. Prominent generalized edema of the subcutaneous fat, confluent in the flanks. No free intra-abdominal air. Musculoskeletal: Degenerative change in the lumbar spine. There are no acute or suspicious  osseous abnormalities. IMPRESSION: 1. Multifocal geographic patchy ground-glass opacities throughout both lungs, consistent with COVID-19 pneumonia. 2. Fluid overload. Moderate right and small left pleural effusion. Small to moderate volume abdominopelvic ascites. Generalized subcutaneous edema of the chest and abdomen consistent with third-spacing, confluent edema in the flanks. 3. Question of nodular hepatic contours, recommend correlation for cirrhosis. 4. Chronic left renal atrophy. Unchanged 7 mm calcification in the left pelvis equivocally within the ureter. No hydronephrosis. Aortic Atherosclerosis (ICD10-I70.0). Electronically Signed   By: Keith Rake M.D.   On: 11/07/2019 23:00   DG CHEST PORT 1  VIEW  Result Date: 11/16/2019 CLINICAL DATA:  Shortness of breath.  COVID-19 positive EXAM: PORTABLE CHEST 1 VIEW COMPARISON:  Nov 09, 2019 FINDINGS: Central catheter tip is in the superior vena cava. No pneumothorax. Airspace opacity is noted bilaterally, more extensive on the right than on the left. There has been slight interval clearing on the left without appreciable change on the right compared to most recent study. This opacity is primarily in a perihilar distribution bilaterally. There is a small layering right pleural effusion. There is cardiomegaly with pulmonary venous hypertension. No adenopathy. No bone lesions. IMPRESSION: Cardiomegaly with pulmonary vascular congestion. Airspace opacity is noted bilaterally, more on the right than on the left with partial clearing from the left upper lobe and left base compared to previous study. No change on the right. Small right pleural effusion. Suspect multifocal pneumonia, although there may well be superimposed pulmonary edema. Question both pneumonia and congestive heart failure concurrent. Electronically Signed   By: Lowella Grip III M.D.   On: 11/16/2019 09:49   DG Chest Port 1 View  Result Date: 11/09/2019 CLINICAL DATA:  Weakness, COVID-19  positive EXAM: PORTABLE CHEST 1 VIEW COMPARISON:  Chest radiograph from one day prior. FINDINGS: Right PICC terminates in the lower third of the SVC. Stable cardiomediastinal silhouette with mild cardiomegaly. No pneumothorax. No pleural effusion. Extensive patchy opacities throughout both lungs, slightly worsened in the upper lungs. IMPRESSION: Extensive patchy opacities throughout both lungs, slightly worsened in the upper lungs, compatible with COVID-19 pneumonia, although with component of pulmonary edema not excluded. Mild cardiomegaly. Electronically Signed   By: Ilona Sorrel M.D.   On: 11/09/2019 08:19   DG Chest Port 1 View  Result Date: 11/08/2019 CLINICAL DATA:  Shortness of breath, COVID-19, diabetes mellitus, hypertension, stage III chronic kidney disease, history endometrial cancer EXAM: PORTABLE CHEST 1 VIEW COMPARISON:  Portable exam 0816 hours compared to 11/06/2019 FINDINGS: RIGHT arm PICC line tip projects over SVC. Enlargement of cardiac silhouette with stable mediastinal contours. Extensive BILATERAL pulmonary infiltrates greatest perihilar on RIGHT, question asymmetric pulmonary edema versus multifocal infection. No pleural effusion or pneumothorax. Bones demineralized with scattered degenerative changes of the thoracic spine. IMPRESSION: Enlargement of cardiac silhouette with increased BILATERAL pulmonary infiltrates greatest RIGHT perihilar question asymmetric pulmonary edema versus multifocal pneumonia. Electronically Signed   By: Lavonia Dana M.D.   On: 11/08/2019 08:51   DG CHEST PORT 1 VIEW  Result Date: 11/06/2019 CLINICAL DATA:  Weakness. EXAM: PORTABLE CHEST 1 VIEW COMPARISON:  November 05, 2019. FINDINGS: Stable cardiomegaly with possible central pulmonary vascular congestion. No pneumothorax is noted. Mild bibasilar atelectasis is noted with small pleural effusions. Bony thorax is unremarkable. IMPRESSION: Stable cardiomegaly with possible central pulmonary vascular congestion.  Mild bibasilar subsegmental atelectasis is noted with small pleural effusions. Electronically Signed   By: Marijo Conception M.D.   On: 11/06/2019 15:57   DG Chest Port 1 View  Result Date: 11/05/2019 CLINICAL DATA:  Weakness. Found on floor today. EXAM: PORTABLE CHEST 1 VIEW COMPARISON:  10/18/2019 FINDINGS: Stable heart size and mediastinal contours with mild cardiomegaly. Hazy opacity at both lung bases likely combination of pleural fluid and airspace disease/atelectasis. Vascular congestion. No pneumothorax. Left proximal humerus fracture appears subacute with surrounding heterotopic ossification, also seen on 09/17/2019 shoulder radiograph. IMPRESSION: 1. Hazy opacity at both lung bases likely combination of pleural fluid and airspace disease/atelectasis. This is stable on the right but progressive on the left from prior. 2. Stable mild cardiomegaly.  Vascular congestion. Electronically  Signed   By: Keith Rake M.D.   On: 11/05/2019 17:02   VAS [place] LOWER EXTREMITY VENOUS (DVT)  Result Date: 11/07/2019  Lower Venous DVTStudy Indications: Swelling, and Edema.  Limitations: Body habitus and poor ultrasound/tissue interface. Comparison Study: 09/16/19 previous Performing Technologist: Abram Sander RVS  Examination Guidelines: A complete evaluation includes B-mode imaging, spectral Doppler, color Doppler, and power Doppler as needed of all accessible portions of each vessel. Bilateral testing is considered an integral part of a complete examination. Limited examinations for reoccurring indications may be performed as noted. The reflux portion of the exam is performed with the patient in reverse Trendelenburg.  +---------+---------------+---------+-----------+----------+--------------+ RIGHT    CompressibilityPhasicitySpontaneityPropertiesThrombus Aging +---------+---------------+---------+-----------+----------+--------------+ CFV      Full           Yes      Yes                                  +---------+---------------+---------+-----------+----------+--------------+ SFJ      Full                                                        +---------+---------------+---------+-----------+----------+--------------+ FV Prox  Full                                                        +---------+---------------+---------+-----------+----------+--------------+ FV Mid                                                Not visualized +---------+---------------+---------+-----------+----------+--------------+ FV Distal               Yes      Yes                                 +---------+---------------+---------+-----------+----------+--------------+ PFV      Full                                                        +---------+---------------+---------+-----------+----------+--------------+ POP      Full           Yes      Yes                                 +---------+---------------+---------+-----------+----------+--------------+ PTV                                                   Not visualized +---------+---------------+---------+-----------+----------+--------------+ PERO  Not visualized +---------+---------------+---------+-----------+----------+--------------+   +---------+---------------+---------+-----------+----------+--------------+ LEFT     CompressibilityPhasicitySpontaneityPropertiesThrombus Aging +---------+---------------+---------+-----------+----------+--------------+ CFV      Full           Yes      Yes                                 +---------+---------------+---------+-----------+----------+--------------+ SFJ      Full                                                        +---------+---------------+---------+-----------+----------+--------------+ FV Prox  Full                                                         +---------+---------------+---------+-----------+----------+--------------+ FV Mid                  Yes      Yes                                 +---------+---------------+---------+-----------+----------+--------------+ FV Distal               Yes      Yes                                 +---------+---------------+---------+-----------+----------+--------------+ PFV      Full                                                        +---------+---------------+---------+-----------+----------+--------------+ POP      Full           Yes      Yes                                 +---------+---------------+---------+-----------+----------+--------------+ PTV      Full                                                        +---------+---------------+---------+-----------+----------+--------------+ PERO                                                  Not visualized +---------+---------------+---------+-----------+----------+--------------+     Summary: BILATERAL: - No evidence of deep vein thrombosis seen in the lower extremities, bilaterally.   *See table(s) above for measurements and observations. Electronically signed by Curt Jews MD on 11/07/2019 at 4:26:57 PM.    Final    [place] EKG SITE RITE  Result Date: 11/07/2019  If [employer] not attached, placement could not be confirmed due to current cardiac rhythm.  US Abdomen Limited RUQ  Result Date: 11/16/2019 CLINICAL DATA:  Cirrhosis EXAM: ULTRASOUND ABDOMEN LIMITED RIGHT UPPER QUADRANT COMPARISON:  11/07/2019 FINDINGS: Gallbladder: The gallbladder wall is thickened measuring approximately 8.6 mm in thickness. The sonographic Murphy sign cannot be adequately assessed. There is gallbladder sludge. There is some mild pericholecystic free fluid. Common bile duct: Diameter: 6 mm Liver: The liver surface is nodular. There is no discrete hepatic mass. Portal vein is patent on color Doppler imaging with normal direction of blood  flow towards the liver. Other: There is a small volume of ascites in the upper abdomen. There is right-sided pelviectasis. The right kidney is echogenic. IMPRESSION: 1. There is gallbladder sludge with associated gallbladder wall thickening. The sonographic Murphy sign cannot be adequately assessed. If there is high clinical suspicion for acute cholecystitis, follow-up with HIDA scan is recommended. 2. Nodular appearance of the liver which can be seen in patients with cirrhosis. There is no discrete hepatic mass. 3. Echogenic right kidney suggestive of underlying medical renal disease. 4. Right-sided pelviectasis of unknown clinical significance. This appears to be new since the patient's prior CT dated 11/07/2019 5. Small volume abdominal ascites. Electronically Signed   By: Constance Holster M.D.   On: 11/16/2019 17:14

## 2019-11-20 NOTE — TOC Progression Note (Signed)
Transition of Care [facility]) - Progression Note    Patient Details  Name: Vanessa [name] MRN: [id number] Date of Birth: 03-Jan-1956  [facility]) CM/SW [place], LCSW Phone Number: 11/20/2019, 12:12 PM  Clinical Narrative:    CSW re-submitted clinicals to [place] for [place].   Expected Discharge Plan: Skilled Nursing Facility Barriers to Discharge: Continued Medical Work up  Expected Discharge Plan and Services Expected Discharge Plan: [place] In-house Referral: Clinical Social Work Discharge Planning Services: CM Consult Post Acute Care Choice: [place] Living arrangements for the past 2 months: [place]                                       Social Determinants of Health (SDOH) Interventions    Readmission Risk Interventions Readmission Risk Prevention Plan 11/08/2019 10/21/2019 09/25/2019  Transportation Screening Complete Complete Complete  PCP or Specialist Appt within 3-5 Days - - Not Complete  Not Complete comments - - plan for SNF  [place]RI or [place] - - Complete  Social Work Consult for [place] Planning/Counseling - - Complete  Palliative Care Screening - - Not Applicable  Medication Review [employer]) Complete Referral to Pharmacy Referral to Pharmacy  PCP or Specialist appointment within 3-5 days of discharge Complete Complete -  [place] or Home Care Consult Complete (No Data) -  SW Recovery Care/Counseling Consult Complete Complete -  Palliative Care Screening Not Applicable Not Applicable -  Skilled Nursing Facility Complete Complete -  Some recent data might be hidden

## 2019-11-20 NOTE — Progress Notes (Signed)
Physical Therapy Treatment Patient Details Name: Vanessa [name] MRN: [id number] DOB: 1956-05-08 Today's Date: 11/20/2019    History of Present Illness 64 year old female admitted 11/05/19 with inability to care for herself at home. Patient was recently discharged from SNF and has been unable to care for herself since. She was found to be COVID+. Hypothermia of unclear etiology. PMH includes HTN, DM, CKD, and endometrial cancer.     PT Comments    Pt was able to walk two laps around her room with RW and min assist.  Second person was there for safety, but ultimately not needed.  She would likely be safer with chair to follow for initial progression to hallway ambulation as she has limited endurance.  O2 sats remained in the low 90s on RA during gait.  Pt handed off to OT at the end of our session.  PT will continue to follow acutely for safe mobility progression.   Follow Up Recommendations  SNF;Supervision/Assistance - 24 hour     Equipment Recommendations  Rolling walker with 5" wheels;3in1 (PT);Other (comment)(both wide)    Recommendations for Other Services   NA     Precautions / Restrictions Precautions Precautions: Fall    Mobility  Bed Mobility               General bed mobility comments: Pt was OOB in the recliner chair.   Transfers Overall transfer level: Needs assistance Equipment used: Rolling walker (2 wheeled) Transfers: Sit to/from Stand Sit to Stand: Min assist         General transfer comment: Min assist to stand twice from recliner chair.  Cues for safe hand placement during transitions.   Ambulation/Gait Ambulation/Gait assistance: Min assist Gait Distance (Feet): 20 Feet(x2) Assistive device: Rolling walker (2 wheeled) Gait Pattern/deviations: Step-through pattern;Shuffle;Trunk flexed     General Gait Details: Pt with shuffling gait pattern, fatigues quickly requiring a seated rest before walking across the room again.  Second person for chair  to follow for hallway distances would be safer.  no DOE and O2 sats 91% at lowest on RA during mobility.        Balance Overall balance assessment: Needs assistance Sitting-balance support: Feet supported;No upper extremity supported Sitting balance-Leahy Scale: Fair     Standing balance support: Bilateral upper extremity supported Standing balance-Leahy Scale: Poor Standing balance comment: needs external support from AD in standing.                             Cognition Arousal/Alertness: Awake/alert Behavior During Therapy: WFL for tasks assessed/performed Overall Cognitive Status: No family/caregiver present to determine baseline cognitive functioning                                 General Comments: Not specifically tested             Pertinent Vitals/Pain Pain Assessment: No/denies pain           PT Goals (current goals can now be found in the care plan section) Acute Rehab PT Goals Patient Stated Goal: get stronger after rehab PT Goal Formulation: With patient Time For Goal Achievement: 12/04/19 Potential to Achieve Goals: Good Progress towards PT goals: Progressing toward goals(goals updated)    Frequency    Min 2X/week      PT Plan Current plan remains appropriate       AM-PAC PT "  6 Clicks" Mobility   Outcome Measure  Help needed turning from your back to your side while in a flat bed without using bedrails?: A Little Help needed moving from lying on your back to sitting on the side of a flat bed without using bedrails?: A Little Help needed moving to and from a bed to a chair (including a wheelchair)?: A Little Help needed standing up from a chair using your arms (e.g., wheelchair or bedside chair)?: A Little Help needed to walk in hospital room?: A Little Help needed climbing 3-5 steps with a railing? : A Lot 6 Click Score: 17    End of Session   Activity Tolerance: Patient limited by fatigue Patient left: Other  (comment)(standing at sink with OT) Nurse Communication: Mobility status PT Visit Diagnosis: Unsteadiness on feet (R26.81);Difficulty in walking, not elsewhere classified (R26.2)     Time: 5643-3295 PT Time Calculation (min) (ACUTE ONLY): 16 min  Charges:  $Gait Training: 8-22 mins                    Verdene Lennert, PT, DPT  Acute Rehabilitation [phone] pager #[phone] office     11/20/2019, 2:45 PM

## 2019-11-20 NOTE — Progress Notes (Addendum)
Occupational Therapy Treatment Patient Details Name: Vanessa [name] MRN: [id number] DOB: [birth year] Today's Date: 11/20/2019    History of present illness 64 year old female admitted 11/05/19 with inability to care for herself at home. Patient was recently discharged from SNF and has been unable to care for herself since. She was found to be COVID+. Hypothermia of unclear etiology. PMH includes HTN, DM, CKD, and endometrial cancer.    OT comments  Patient working with PT on arrival and walking around room with RW and min assist.  Patient on room air and SpO2 remained >90 throughout session.  Activity tolerance is increasing as patient was able to stand 15 min at sink and complete grooming, UB, and LB bath with min guard.  She did require 5 min rest after stand before walking across room and returning to chair.  She felt very good during session and has shown improvement.  Will continue to follow with OT acutely to address the deficits listed below.    Follow Up Recommendations  SNF;Supervision/Assistance - 24 hour    Equipment Recommendations  3-in-1   Recommendations for Other Services      Precautions / Restrictions Precautions Precautions: Fall       Mobility Bed Mobility               General bed mobility comments: Pt was OOB in the recliner chair.   Transfers Overall transfer level: Needs assistance Equipment used: Rolling walker (2 wheeled) Transfers: Sit to/from Stand Sit to Stand: Min assist         General transfer comment: Min assist to stand twice from recliner chair.  Cues for safe hand placement during transitions.     Balance Overall balance assessment: Needs assistance Sitting-balance support: Feet supported;No upper extremity supported Sitting balance-Leahy Scale: Fair     Standing balance support: Bilateral upper extremity supported;During functional activity Standing balance-Leahy Scale: Poor Standing balance comment: needs external support  from AD in standing.                            ADL either performed or assessed with clinical judgement   ADL Overall ADL's : Needs assistance/impaired     Grooming: Wash/dry hands;Oral care;Wash/dry face;Supervision/safety;Standing   Upper Body Bathing: Min guard;Standing   Lower Body Bathing: Min guard;Sit to/from stand                       Functional mobility during ADLs: Minimal assistance;Rolling walker General ADL Comments: Patient moving very well today, showing increased actvity tolerance standing for 15 min     Vision       Perception     Praxis      Cognition Arousal/Alertness: Awake/alert Behavior During Therapy: WFL for tasks assessed/performed Overall Cognitive Status: No family/caregiver present to determine baseline cognitive functioning                       Memory: Decreased short-term memory         General Comments: Not specifically tested        Exercises     Shoulder Instructions       General Comments      Pertinent Vitals/ Pain       Pain Assessment: No/denies pain  Home Living  Prior Functioning/Environment              Frequency  Min 2X/week        Progress Toward Goals  OT Goals(current goals can now be found in the care plan section)  Progress towards OT goals: Progressing toward goals  Acute Rehab OT Goals Patient Stated Goal: get stronger after rehab OT Goal Formulation: With patient Time For Goal Achievement: 11/21/19 Potential to Achieve Goals: Good  Plan Discharge plan remains appropriate    Co-evaluation    PT/OT/SLP Co-Evaluation/Treatment: Yes     OT goals addressed during session: ADL's and self-care;Strengthening/ROM      AM-PAC OT "6 Clicks" Daily Activity     Outcome Measure   Help from another person eating meals?: None Help from another person taking care of personal grooming?: A Little Help  from another person toileting, which includes using toliet, bedpan, or urinal?: A Little Help from another person bathing (including washing, rinsing, drying)?: A Little Help from another person to put on and taking off regular upper body clothing?: A Little Help from another person to put on and taking off regular lower body clothing?: A Little 6 Click Score: 19    End of Session Equipment Utilized During Treatment: Rolling walker  OT Visit Diagnosis: Unsteadiness on feet (R26.81);Other abnormalities of gait and mobility (R26.89);History of falling (Z91.81);Muscle weakness (generalized) (M62.81);Pain   Activity Tolerance Patient tolerated treatment well   Patient Left in chair;with call bell/phone within reach;with nursing/sitter in room   Nurse Communication Mobility status        Time: 2878-6767 OT Time Calculation (min): 43 min  Charges: OT General Charges $OT Visit: 1 Visit OT Treatments $Self Care/Home Management : 23-37 mins $Therapeutic Activity: 8-22 mins  August Luz, OTR/L    Phylliss Bob 11/20/2019, 3:08 PM

## 2019-11-21 LAB — GLUCOSE, CAPILLARY
Glucose-Capillary: 129 mg/dL — ABNORMAL HIGH (ref 70–99)
Glucose-Capillary: 126 mg/dL — ABNORMAL HIGH (ref 70–99)
Glucose-Capillary: 145 mg/dL — ABNORMAL HIGH (ref 70–99)
Glucose-Capillary: 144 mg/dL — ABNORMAL HIGH (ref 70–99)

## 2019-11-21 MED ORDER — ENSURE ENLIVE PO LIQD: 237.0000 mL | Freq: Three times a day (TID) | ORAL | 12 refills | Status: DC

## 2019-11-21 MED ORDER — CLONIDINE HCL 0.2 MG PO TABS: 0.2000 mg | ORAL_TABLET | Freq: Two times a day (BID) | ORAL | Status: DC

## 2019-11-21 MED ORDER — HYDRALAZINE HCL 50 MG PO TABS: 100.0000 mg | ORAL_TABLET | Freq: Three times a day (TID) | ORAL | Status: DC

## 2019-11-21 MED ORDER — PRO-STAT SUGAR FREE PO LIQD: 30.0000 mL | Freq: Three times a day (TID) | ORAL | 0 refills | Status: DC

## 2019-11-21 MED ORDER — POTASSIUM CHLORIDE ER 10 MEQ PO TBCR: 20.0000 meq | EXTENDED_RELEASE_TABLET | Freq: Every day | ORAL | Status: DC

## 2019-11-21 NOTE — Care Management Important Message (Signed)
Important Message  Patient Details  Name: Vanessa [name] MRN: [id number] Date of Birth: [birth year]   Medicare Important Message Given:  Yes - Important Message mailed due to current National Emergency  Verbal consent obtained due to current National Emergency  Relationship to patient: Self Contact Name: Novalyn Lajara Call Date: 11/21/19  Time: 1453 Phone: [phone] Outcome: Spoke with contact Important Message mailed to: Patient address on file    Delorse Lek 11/21/2019, 2:53 PM

## 2019-11-21 NOTE — Discharge Summary (Addendum)
PATIENT DETAILS Name: Vanessa [name] Age: 64 y.o. Sex: female Date of Birth: 01/18/1956 MRN: [id number]. Admitting Physician: Mckinley Jewel, MD OZY:[id number], Leda Quail, MD (Inactive)  Admit Date: 11/05/2019 Discharge date: 11/22/2019  Recommendations for Outpatient Follow-up:  1. Follow up with PCP in 1-2 weeks 2. Please obtain CMP/CBC in one week 3. Please consider outpatient endocrine evaluation for hypothermia of unknown etiology. 4. Consider outpatient GI evaluation-has radiographic evidence of cirrhosis. 5. Ensure follow-up with GYN oncology-Dr. Berline Lopes  Admitted From:  Home  Disposition: SNF   Home Health: No  Equipment/Devices: None  Discharge Condition: Stable  CODE STATUS: FULL CODE  Diet recommendation:  Diet Order            Diet - low sodium heart healthy        Diet Carb Modified        DIET DYS 2 Room service appropriate? Yes; Fluid consistency: Thin  Diet effective now               Brief Narrative: Patient is a 64 y.o. female with PMHx of HTN, DM, HLD, chronic diastolic heart failure, CKD stage IIIb, endometrial cancer, recent history of GI bleeding and MSSA bacteremia finished a course of IV antibiotics-who presented to the hospital on 4/27 with weakness, fever-found to have sepsis secondary to COVID-19 pneumonia.  Hospital course complicated by persistent hypothermia.  Significant Events: 4/27>> admit for weakness/fever-sepsis secondary to COVID-19 pneumonia.  COVID-19 medications: Steroids: 4/29>> 5/2 Remdesivir: 4/29>> 5/3  Antibiotics: Vancomycin: 4/29>> 5/1 Zosyn: 4/29>> 5/2 Doxycycline: 5/3>> 5/5 Flagyl: 5/7>>5/13  Microbiology data: 4/28: Blood culture>>  negative 5/5: Blood culture>> negative  Significant studies: 4/29 CT chest>> patchy groundglass opacities, fluid overload, moderate right and small left pleural effusion 4/29 CT abdomen/pelvis>> small volume ascites, nodular hepatic contour, chronic left renal  atrophy 4/29 lower extremity Doppler: No evidence of DVT. 5/8 Limited abdominal ultrasound: Gallbladder sludge with gallbladder wall thickening, nodular appearance of the liver, echogenic right kidney suggestive of medical renal disease.  Procedures: None  Consults: None  Brief Hospital Course: Sepsis: Etiology unclear-she recently had MSSA bacteremia-and has COVID-19 pneumonia.  Sepsis physiology seems to have improved with supportive care.  Repeat blood cultures are negative-has been treated with steroids and remdesivir for COVID-19 pneumonia.  She is currently stable and on room air.  COVID-19 Labs:  No results for input(s): DDIMER, FERRITIN, LDH, CRP in the last 72 hours.  Lab Results  Component Value Date   SARSCOV2NAA POSITIVE (A) 11/06/2019   [place] NEGATIVE 10/18/2019   [place] NEGATIVE 10/09/2019   [place] NEGATIVE 10/04/2019    Hypothermia: Appears to be mild-better at times fluctuating anywhere from 69 F to 96 F-she is completely asymptomatic for the past several weeks that she has been hospitalized.Marland Kitchen  TSH within normal limits-random cortisol appears appropriate.  Blood cultures negative so far.  Unclear why she remains persistently hypothermic-she is completely asymptomatic-could have reset thermostat/central etiology.  Since she appears asymptomatic-and has been stable with unexplained hypothermia for the past several weeks-suspect that she could be monitored at SNF rather than being hospitalized.  Have recommended that she get a outpatient endocrine evaluation.  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 Berline Lopes post hospital discharge.  History of trichomoniasis: Apparently was supposed to be started on Flagyl prior to this hospital stay-but never did-will complete Flagyl-x7 days on 5/13  History of recent GI bleeding with duodenal ulcer: No evidence of recurrence-remains on PPI.  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  However has significant lower extremity edema-continue Lasix on discharge.  Consider outpatient follow-up with gastroenterology.  CKD stage IIIb: Creatinine close to usual baseline-follow.  HTN: Remains on the higher side but overall improving-continue Coreg, amlodipine, hydralazine and clonidine on discharge.  Anemia: Appears to have chronic anemia-likely secondary to underlying chronic disease-CKD/endometrial cancer-stable for outpatient monitoring.  Continue iron supplementation on discharge.  Chronic lower extremity edema: Mostly nonpitting-appears to be a chronic issue per patient-continue Lasix.  DM-2 (A1c 5.7 on 09/15/2019): CBG appears stable-no further episodes of hypoglycemia.  Do not think she requires SSI on discharge-continue to monitor CBGs at SNF.   Obesity: Estimated body mass index is 41.33 kg/m as calculated from the following:   Height as of this encounter: 5' (1.524 m).   Weight as of this encounter: 96 kg.    Discharge Diagnoses:  Principal Problem:   Generalized weakness Active Problems:   DM type 2 (diabetes mellitus, type 2) (HCC)   Dyslipidemia   Endometrial cancer (HCC)   Macrocytic anemia   CKD (chronic kidney disease) stage 3, GFR 30-59 ml/min   Hypernatremia   COVID-19 virus infection   Hypertensive urgency   Discharge Instructions:    Person Under Monitoring Name: Vanessa [name]  Location: [address] [place] [address]   Infection Prevention Recommendations for Individuals Confirmed to have, or Being Evaluated for, 2019 Novel Coronavirus (COVID-19) Infection Who Receive Care at Home  Individuals who are confirmed to have, or are being evaluated for, COVID-19 should follow the prevention steps below until a healthcare provider or local or state health  department says they can return to normal activities.  Stay home except to get medical care You should restrict activities outside your home, except for getting medical care. Do not go to work, school, or public areas, and do not use public transportation or taxis.  Call ahead before visiting your doctor Before your medical appointment, call the healthcare provider and tell them that you have, or are being evaluated for, COVID-19 infection. This will help the healthcare provider's office take steps to keep other people from getting infected. Ask your healthcare provider to call the local or state health department.  Monitor your symptoms Seek prompt medical attention if your illness is worsening (e.g., difficulty breathing). Before going to your medical appointment, call the healthcare provider and tell them that you have, or are being evaluated for, COVID-19 infection. Ask your healthcare provider to call the local or state health department.  Wear a facemask You should wear a facemask that covers your nose and mouth when you are in the same room with other people and when you visit a healthcare provider. People who live with or visit you should also wear a facemask while they are in the same room with you.  Separate yourself from other people in your home As much as possible, you should stay in a different room from other people in your home. Also, you should use a separate bathroom, if available.  Avoid sharing household items You should not share dishes, drinking glasses, cups, eating utensils, towels, bedding, or other items with other people in your home. After using these items, you should wash them thoroughly with soap and water.  Cover your coughs and sneezes Cover your mouth and nose with a tissue when you cough or sneeze, or you can cough or sneeze  into your sleeve. Throw used tissues in a lined trash can, and immediately wash your hands with soap and water for at least 20  seconds or use an alcohol-based hand rub.  Wash your [facility] your hands often and thoroughly with soap and water for at least 20 seconds. You can use an alcohol-based hand sanitizer if soap and water are not available and if your hands are not visibly dirty. Avoid touching your eyes, nose, and mouth with unwashed hands.   Prevention Steps for Caregivers and Household Members of Individuals Confirmed to have, or Being Evaluated for, COVID-19 Infection Being Cared for in the Home  If you live with, or provide care at home for, a person confirmed to have, or being evaluated for, COVID-19 infection please follow these guidelines to prevent infection:  Follow healthcare provider's instructions Make sure that you understand and can help the patient follow any healthcare provider instructions for all care.  Provide for the patient's basic needs You should help the patient with basic needs in the home and provide support for getting groceries, prescriptions, and other personal needs.  Monitor the patient's symptoms If they are getting sicker, call his or her medical provider and tell them that the patient has, or is being evaluated for, COVID-19 infection. This will help the healthcare provider's office take steps to keep other people from getting infected. Ask the healthcare provider to call the local or state health department.  Limit the number of people who have contact with the patient  If possible, have only one caregiver for the patient.  Other household members should stay in another home or place of residence. If this is not possible, they should stay  in another room, or be separated from the patient as much as possible. Use a separate bathroom, if available.  Restrict visitors who do not have an essential need to be in the home.  Keep older adults, very young children, and other sick people away from the patient Keep older adults, very young children, and those who have  compromised immune systems or chronic health conditions away from the patient. This includes people with chronic heart, lung, or kidney conditions, diabetes, and cancer.  Ensure good ventilation Make sure that shared spaces in the home have good air flow, such as from an air conditioner or an opened window, weather permitting.  Wash your hands often  Wash your hands often and thoroughly with soap and water for at least 20 seconds. You can use an alcohol based hand sanitizer if soap and water are not available and if your hands are not visibly dirty.  Avoid touching your eyes, nose, and mouth with unwashed hands.  Use disposable paper towels to dry your hands. If not available, use dedicated cloth towels and replace them when they become wet.  Wear a facemask and gloves  Wear a disposable facemask at all times in the room and gloves when you touch or have contact with the patient's blood, body fluids, and/or secretions or excretions, such as sweat, saliva, sputum, nasal mucus, vomit, urine, or feces.  Ensure the mask fits over your nose and mouth tightly, and do not touch it during use.  Throw out disposable facemasks and gloves after using them. Do not reuse.  Wash your hands immediately after removing your facemask and gloves.  If your personal clothing becomes contaminated, carefully remove clothing and launder. Wash your hands after handling contaminated clothing.  Place all used disposable facemasks, gloves, and other waste in  a lined container before disposing them with other household waste.  Remove gloves and wash your hands immediately after handling these items.  Do not share dishes, glasses, or other household items with the patient  Avoid sharing household items. You should not share dishes, drinking glasses, cups, eating utensils, towels, bedding, or other items with a patient who is confirmed to have, or being evaluated for, COVID-19 infection.  After the person uses  these items, you should wash them thoroughly with soap and water.  Wash laundry thoroughly  Immediately remove and wash clothes or bedding that have blood, body fluids, and/or secretions or excretions, such as sweat, saliva, sputum, nasal mucus, vomit, urine, or feces, on them.  Wear gloves when handling laundry from the patient.  Read and follow directions on labels of laundry or clothing items and detergent. In general, wash and dry with the warmest temperatures recommended on the label.  Clean all areas the individual has used often  Clean all touchable surfaces, such as counters, tabletops, doorknobs, bathroom fixtures, toilets, phones, keyboards, tablets, and bedside tables, every day. Also, clean any surfaces that may have blood, body fluids, and/or secretions or excretions on them.  Wear gloves when cleaning surfaces the patient has come in contact with.  Use a diluted bleach solution (e.g., dilute bleach with 1 part bleach and 10 parts water) or a household disinfectant with a label that says EPA-registered for coronaviruses. To make a bleach solution at home, add 1 tablespoon of bleach to 1 quart (4 cups) of water. For a larger supply, add  cup of bleach to 1 gallon (16 cups) of water.  Read labels of cleaning products and follow recommendations provided on product labels. Labels contain instructions for safe and effective use of the cleaning product including precautions you should take when applying the product, such as wearing gloves or eye protection and making sure you have good ventilation during use of the product.  Remove gloves and wash hands immediately after cleaning.  Monitor yourself for signs and symptoms of illness Caregivers and household members are considered close contacts, should monitor their health, and will be asked to limit movement outside of the home to the extent possible. Follow the monitoring steps for close contacts listed on the symptom monitoring  form.   ? If you have additional questions, contact your local health department or call the epidemiologist on call at [phone] (available 24/7). ? This guidance is subject to change. For the most up-to-date guidance from CDC, please refer to their website: YouBlogs.pl    Activity:  As tolerated with Full fall precautions use walker/cane & assistance as needed   Discharge Instructions    Call MD for:  extreme fatigue   Complete by: As directed    Call MD for:  persistant dizziness or light-headedness   Complete by: As directed    Call MD for:  temperature >100.4   Complete by: As directed    Diet - low sodium heart healthy   Complete by: As directed    Diet Carb Modified   Complete by: As directed    Discharge instructions   Complete by: As directed    Check CBGs before meals and at bedtime   Increase activity slowly   Complete by: As directed      Allergies as of 11/22/2019   No Known Allergies     Medication List    STOP taking these medications   oxyCODONE 5 MG immediate release tablet Commonly known as: Oxy  IR/ROXICODONE     TAKE these medications   acetaminophen 500 MG tablet Commonly known as: TYLENOL Take 500 mg by mouth every 6 (six) hours as needed for moderate pain.   amLODipine 10 MG tablet Commonly known as: NORVASC Take 1 tablet (10 mg total) by mouth daily.   Bayer Contour Monitor w/Device Kit Use as directed for 3 times daily testing of blood glucose. E11.9   benzocaine 10 % mucosal gel Commonly known as: ORAJEL Use as directed 1 application in the mouth or throat 4 (four) times daily as needed for mouth pain.   carvedilol 12.5 MG tablet Commonly known as: COREG Take 1 tablet (12.5 mg total) by mouth 2 (two) times daily with a meal.   Chlorhexidine Gluconate Cloth 2 % Pads Apply 6 each topically daily.   cloNIDine 0.2 MG tablet Commonly known as: CATAPRES Take 1 tablet  (0.2 mg total) by mouth 2 (two) times daily.   diphenhydrAMINE 25 MG tablet Commonly known as: BENADRYL Take 25 mg by mouth once.   feeding supplement (ENSURE ENLIVE) Liqd Take 237 mLs by mouth 3 (three) times daily between meals.   feeding supplement (PRO-STAT SUGAR FREE 64) Liqd Take 30 mLs by mouth 3 (three) times daily with meals.   ferrous sulfate 325 (65 FE) MG tablet Take 1 tablet (325 mg total) by mouth 2 (two) times daily with a meal.   furosemide 40 MG tablet Commonly known as: Lasix Take 1 tablet (40 mg total) by mouth daily.   glucose blood test strip Commonly known as: Estate manager/land agent Use as instructed for 3 times daily testing of blood glucose. E11.9   hydrALAZINE 50 MG tablet Commonly known as: APRESOLINE Take 2 tablets (100 mg total) by mouth every 8 (eight) hours. What changed: how much to take   INSULIN SYRINGE 1CC/29G 29G X 1/2" 1 ML Misc Insulin syringes   levonorgestrel 20 MCG/24HR IUD Commonly known as: MIRENA 1 Intra Uterine Device (1 each total) by Intrauterine route once for 1 dose. To be placed in the office   loratadine 10 MG tablet Commonly known as: CLARITIN Take 10 mg by mouth daily as needed for allergies.   onetouch ultrasoft lancets Use as instructed   pantoprazole sodium 40 mg/20 mL Pack Commonly known as: PROTONIX Take 20 mLs (40 mg total) by mouth daily.   polyethylene glycol 17 g packet Commonly known as: MIRALAX / GLYCOLAX Take 17 g by mouth daily.   potassium chloride 10 MEQ tablet Commonly known as: KLOR-CON Take 2 tablets (20 mEq total) by mouth daily.       Contact information for follow-up providers    Lottie Mussel T, MD. Schedule an appointment as soon as possible for a visit in 1 week(s).   Specialty: Internal Medicine Contact information: [place] [address] [phone]        Renato Shin, MD. Schedule an appointment as soon as possible for a visit in 1 week(s).   Specialty:  Endocrinology Contact information: [address] [place] [address] [phone]        Lafonda Mosses, MD. Schedule an appointment as soon as possible for a visit in 1 week(s).   Specialty: Gynecologic Oncology Contact information: [place] [address] [phone]            Contact information for after-discharge care    Destination    [facility] .   Service: Skilled Nursing  Contact information: [address] [address] [phone]                 No Known Allergies   Other Procedures/Studies: CT ABDOMEN PELVIS WO CONTRAST  Result Date: 11/07/2019 CLINICAL DATA:  Generalized weakness and lethargy. Sepsis. Ascites. Cough. COVID positive yesterday. EXAM: CT CHEST, ABDOMEN AND PELVIS WITHOUT CONTRAST TECHNIQUE: Multidetector CT imaging of the chest, abdomen and pelvis was performed following the standard protocol without IV contrast. COMPARISON:  Radiograph yesterday. Abdominopelvic CT 09/15/2019 FINDINGS: CT CHEST FINDINGS Cardiovascular: Right upper extremity PICC with tip in the SVC. Motion artifact partially obscures detailed evaluation. Normal caliber thoracic aorta. Mild atherosclerosis. Mild cardiomegaly. No pericardial effusion. Mediastinum/Nodes: Motion and lack of contrast limits assessment for adenopathy. Small mediastinal lymph nodes not definitively enlarged. Assessment for hilar adenopathy is significantly limited. Esophagus is patulous. No suspicious thyroid nodule. Lungs/Pleura: Moderate right and small left pleural effusion. Adjacent compressive atelectasis. There are multifocal geographic patchy ground-glass opacities throughout both lungs. Trachea and mainstem bronchi are grossly patent, partially motion obscured. Musculoskeletal: Generalized subcutaneous edema throughout the chest wall. Diffuse degenerative change throughout the thoracic spine. There are no  acute or suspicious osseous abnormalities. CT ABDOMEN PELVIS FINDINGS Hepatobiliary: No evidence of focal hepatic abnormality on noncontrast exam. Liver is prominent size spanning 20 cm cranial caudal. Question of nodular hepatic contours. Hyperdense gallbladder contents. No calcified gallstone. Common bile duct is not well-defined. Pancreas: No pancreatic ductal dilatation. No disproportionate peripancreatic edema. Spleen: Normal in size without focal abnormality. Adrenals/Urinary Tract: Adrenal thickening without dominant nodule. Chronic left renal atrophy. Left ureter is nondilated, unchanged 7 mm calcification in the left pelvis equivocally within the ureter. Right kidney appears normal in size. No hydronephrosis or renal calculi. Urinary bladder is distended and unremarkable. Stomach/Bowel: Bowel evaluation is limited in the absence of contrast, presence of ascites, and motion artifact. Nondistended stomach, grossly unremarkable. No small bowel obstruction. Appendix not visualized. Moderate volume of stool throughout the colon. There is mild colonic redundancy. No obvious colonic wall thickening or pericolonic inflammation. Vascular/Lymphatic: Abdominal aorta is normal in caliber. No bulky abdominopelvic adenopathy. Reproductive: Uterus is slightly bulky. Limited assessment for adnexal mass given pelvic ascites. Other: Small to moderate volume abdominopelvic ascites. There is generalized edema of the intra-abdominal fat. Prominent generalized edema of the subcutaneous fat, confluent in the flanks. No free intra-abdominal air. Musculoskeletal: Degenerative change in the lumbar spine. There are no acute or suspicious osseous abnormalities. IMPRESSION: 1. Multifocal geographic patchy ground-glass opacities throughout both lungs, consistent with COVID-19 pneumonia. 2. Fluid overload. Moderate right and small left pleural effusion. Small to moderate volume abdominopelvic ascites. Generalized subcutaneous edema of  the chest and abdomen consistent with third-spacing, confluent edema in the flanks. 3. Question of nodular hepatic contours, recommend correlation for cirrhosis. 4. Chronic left renal atrophy. Unchanged 7 mm calcification in the left pelvis equivocally within the ureter. No hydronephrosis. Aortic Atherosclerosis (ICD10-I70.0). Electronically Signed   By: Keith Rake M.D.   On: 11/07/2019 23:00   CT CHEST WO CONTRAST  Result Date: 11/07/2019 CLINICAL DATA:  Generalized weakness and lethargy. Sepsis. Ascites. Cough. COVID positive yesterday. EXAM: CT CHEST, ABDOMEN AND PELVIS WITHOUT CONTRAST TECHNIQUE: Multidetector CT imaging of the chest, abdomen and pelvis was performed following the standard protocol without IV contrast. COMPARISON:  Radiograph yesterday. Abdominopelvic CT 09/15/2019 FINDINGS: CT CHEST FINDINGS Cardiovascular: Right upper extremity PICC with tip in the SVC. Motion artifact partially obscures detailed evaluation. Normal caliber thoracic aorta. Mild atherosclerosis. Mild  cardiomegaly. No pericardial effusion. Mediastinum/Nodes: Motion and lack of contrast limits assessment for adenopathy. Small mediastinal lymph nodes not definitively enlarged. Assessment for hilar adenopathy is significantly limited. Esophagus is patulous. No suspicious thyroid nodule. Lungs/Pleura: Moderate right and small left pleural effusion. Adjacent compressive atelectasis. There are multifocal geographic patchy ground-glass opacities throughout both lungs. Trachea and mainstem bronchi are grossly patent, partially motion obscured. Musculoskeletal: Generalized subcutaneous edema throughout the chest wall. Diffuse degenerative change throughout the thoracic spine. There are no acute or suspicious osseous abnormalities. CT ABDOMEN PELVIS FINDINGS Hepatobiliary: No evidence of focal hepatic abnormality on noncontrast exam. Liver is prominent size spanning 20 cm cranial caudal. Question of nodular hepatic contours.  Hyperdense gallbladder contents. No calcified gallstone. Common bile duct is not well-defined. Pancreas: No pancreatic ductal dilatation. No disproportionate peripancreatic edema. Spleen: Normal in size without focal abnormality. Adrenals/Urinary Tract: Adrenal thickening without dominant nodule. Chronic left renal atrophy. Left ureter is nondilated, unchanged 7 mm calcification in the left pelvis equivocally within the ureter. Right kidney appears normal in size. No hydronephrosis or renal calculi. Urinary bladder is distended and unremarkable. Stomach/Bowel: Bowel evaluation is limited in the absence of contrast, presence of ascites, and motion artifact. Nondistended stomach, grossly unremarkable. No small bowel obstruction. Appendix not visualized. Moderate volume of stool throughout the colon. There is mild colonic redundancy. No obvious colonic wall thickening or pericolonic inflammation. Vascular/Lymphatic: Abdominal aorta is normal in caliber. No bulky abdominopelvic adenopathy. Reproductive: Uterus is slightly bulky. Limited assessment for adnexal mass given pelvic ascites. Other: Small to moderate volume abdominopelvic ascites. There is generalized edema of the intra-abdominal fat. Prominent generalized edema of the subcutaneous fat, confluent in the flanks. No free intra-abdominal air. Musculoskeletal: Degenerative change in the lumbar spine. There are no acute or suspicious osseous abnormalities. IMPRESSION: 1. Multifocal geographic patchy ground-glass opacities throughout both lungs, consistent with COVID-19 pneumonia. 2. Fluid overload. Moderate right and small left pleural effusion. Small to moderate volume abdominopelvic ascites. Generalized subcutaneous edema of the chest and abdomen consistent with third-spacing, confluent edema in the flanks. 3. Question of nodular hepatic contours, recommend correlation for cirrhosis. 4. Chronic left renal atrophy. Unchanged 7 mm calcification in the left pelvis  equivocally within the ureter. No hydronephrosis. Aortic Atherosclerosis (ICD10-I70.0). Electronically Signed   By: Keith Rake M.D.   On: 11/07/2019 23:00   DG CHEST PORT 1 VIEW  Result Date: 11/16/2019 CLINICAL DATA:  Shortness of breath.  COVID-19 positive EXAM: PORTABLE CHEST 1 VIEW COMPARISON:  Nov 09, 2019 FINDINGS: Central catheter tip is in the superior vena cava. No pneumothorax. Airspace opacity is noted bilaterally, more extensive on the right than on the left. There has been slight interval clearing on the left without appreciable change on the right compared to most recent study. This opacity is primarily in a perihilar distribution bilaterally. There is a small layering right pleural effusion. There is cardiomegaly with pulmonary venous hypertension. No adenopathy. No bone lesions. IMPRESSION: Cardiomegaly with pulmonary vascular congestion. Airspace opacity is noted bilaterally, more on the right than on the left with partial clearing from the left upper lobe and left base compared to previous study. No change on the right. Small right pleural effusion. Suspect multifocal pneumonia, although there may well be superimposed pulmonary edema. Question both pneumonia and congestive heart failure concurrent. Electronically Signed   By: Lowella Grip III M.D.   On: 11/16/2019 09:49   DG Chest Port 1 View  Result Date: 11/09/2019 CLINICAL DATA:  Weakness, COVID-19 positive EXAM: PORTABLE  CHEST 1 VIEW COMPARISON:  Chest radiograph from one day prior. FINDINGS: Right PICC terminates in the lower third of the SVC. Stable cardiomediastinal silhouette with mild cardiomegaly. No pneumothorax. No pleural effusion. Extensive patchy opacities throughout both lungs, slightly worsened in the upper lungs. IMPRESSION: Extensive patchy opacities throughout both lungs, slightly worsened in the upper lungs, compatible with COVID-19 pneumonia, although with component of pulmonary edema not excluded. Mild  cardiomegaly. Electronically Signed   By: Ilona Sorrel M.D.   On: 11/09/2019 08:19   DG Chest Port 1 View  Result Date: 11/08/2019 CLINICAL DATA:  Shortness of breath, COVID-19, diabetes mellitus, hypertension, stage III chronic kidney disease, history endometrial cancer EXAM: PORTABLE CHEST 1 VIEW COMPARISON:  Portable exam 0816 hours compared to 11/06/2019 FINDINGS: RIGHT arm PICC line tip projects over SVC. Enlargement of cardiac silhouette with stable mediastinal contours. Extensive BILATERAL pulmonary infiltrates greatest perihilar on RIGHT, question asymmetric pulmonary edema versus multifocal infection. No pleural effusion or pneumothorax. Bones demineralized with scattered degenerative changes of the thoracic spine. IMPRESSION: Enlargement of cardiac silhouette with increased BILATERAL pulmonary infiltrates greatest RIGHT perihilar question asymmetric pulmonary edema versus multifocal pneumonia. Electronically Signed   By: Lavonia Dana M.D.   On: 11/08/2019 08:51   DG CHEST PORT 1 VIEW  Result Date: 11/06/2019 CLINICAL DATA:  Weakness. EXAM: PORTABLE CHEST 1 VIEW COMPARISON:  November 05, 2019. FINDINGS: Stable cardiomegaly with possible central pulmonary vascular congestion. No pneumothorax is noted. Mild bibasilar atelectasis is noted with small pleural effusions. Bony thorax is unremarkable. IMPRESSION: Stable cardiomegaly with possible central pulmonary vascular congestion. Mild bibasilar subsegmental atelectasis is noted with small pleural effusions. Electronically Signed   By: Marijo Conception M.D.   On: 11/06/2019 15:57   DG Chest Port 1 View  Result Date: 11/05/2019 CLINICAL DATA:  Weakness. Found on floor today. EXAM: PORTABLE CHEST 1 VIEW COMPARISON:  10/18/2019 FINDINGS: Stable heart size and mediastinal contours with mild cardiomegaly. Hazy opacity at both lung bases likely combination of pleural fluid and airspace disease/atelectasis. Vascular congestion. No pneumothorax. Left proximal  humerus fracture appears subacute with surrounding heterotopic ossification, also seen on 09/17/2019 shoulder radiograph. IMPRESSION: 1. Hazy opacity at both lung bases likely combination of pleural fluid and airspace disease/atelectasis. This is stable on the right but progressive on the left from prior. 2. Stable mild cardiomegaly.  Vascular congestion. Electronically Signed   By: Keith Rake M.D.   On: 11/05/2019 17:02   VAS [place] LOWER EXTREMITY VENOUS (DVT)  Result Date: 11/07/2019  Lower Venous DVTStudy Indications: Swelling, and Edema.  Limitations: Body habitus and poor ultrasound/tissue interface. Comparison Study: 09/16/19 previous Performing Technologist: Abram Sander RVS  Examination Guidelines: A complete evaluation includes B-mode imaging, spectral Doppler, color Doppler, and power Doppler as needed of all accessible portions of each vessel. Bilateral testing is considered an integral part of a complete examination. Limited examinations for reoccurring indications may be performed as noted. The reflux portion of the exam is performed with the patient in reverse Trendelenburg.  +---------+---------------+---------+-----------+----------+--------------+ RIGHT    CompressibilityPhasicitySpontaneityPropertiesThrombus Aging +---------+---------------+---------+-----------+----------+--------------+ CFV      Full           Yes      Yes                                 +---------+---------------+---------+-----------+----------+--------------+ SFJ      Full                                                        +---------+---------------+---------+-----------+----------+--------------+  FV Prox  Full                                                        +---------+---------------+---------+-----------+----------+--------------+ FV Mid                                                Not visualized +---------+---------------+---------+-----------+----------+--------------+  FV Distal               Yes      Yes                                 +---------+---------------+---------+-----------+----------+--------------+ PFV      Full                                                        +---------+---------------+---------+-----------+----------+--------------+ POP      Full           Yes      Yes                                 +---------+---------------+---------+-----------+----------+--------------+ PTV                                                   Not visualized +---------+---------------+---------+-----------+----------+--------------+ PERO                                                  Not visualized +---------+---------------+---------+-----------+----------+--------------+   +---------+---------------+---------+-----------+----------+--------------+ LEFT     CompressibilityPhasicitySpontaneityPropertiesThrombus Aging +---------+---------------+---------+-----------+----------+--------------+ CFV      Full           Yes      Yes                                 +---------+---------------+---------+-----------+----------+--------------+ SFJ      Full                                                        +---------+---------------+---------+-----------+----------+--------------+ FV Prox  Full                                                        +---------+---------------+---------+-----------+----------+--------------+ FV Mid  Yes      Yes                                 +---------+---------------+---------+-----------+----------+--------------+ FV Distal               Yes      Yes                                 +---------+---------------+---------+-----------+----------+--------------+ PFV      Full                                                        +---------+---------------+---------+-----------+----------+--------------+ POP      Full           Yes      Yes                                  +---------+---------------+---------+-----------+----------+--------------+ PTV      Full                                                        +---------+---------------+---------+-----------+----------+--------------+ PERO                                                  Not visualized +---------+---------------+---------+-----------+----------+--------------+     Summary: BILATERAL: - No evidence of deep vein thrombosis seen in the lower extremities, bilaterally.   *See table(s) above for measurements and observations. Electronically signed by Curt Jews MD on 11/07/2019 at 4:26:57 PM.    Final    [place] EKG SITE RITE  Result Date: 11/07/2019 If Site Rite image not attached, placement could not be confirmed due to current cardiac rhythm.  US Abdomen Limited RUQ  Result Date: 11/16/2019 CLINICAL DATA:  Cirrhosis EXAM: ULTRASOUND ABDOMEN LIMITED RIGHT UPPER QUADRANT COMPARISON:  11/07/2019 FINDINGS: Gallbladder: The gallbladder wall is thickened measuring approximately 8.6 mm in thickness. The sonographic Murphy sign cannot be adequately assessed. There is gallbladder sludge. There is some mild pericholecystic free fluid. Common bile duct: Diameter: 6 mm Liver: The liver surface is nodular. There is no discrete hepatic mass. Portal vein is patent on color Doppler imaging with normal direction of blood flow towards the liver. Other: There is a small volume of ascites in the upper abdomen. There is right-sided pelviectasis. The right kidney is echogenic. IMPRESSION: 1. There is gallbladder sludge with associated gallbladder wall thickening. The sonographic Murphy sign cannot be adequately assessed. If there is high clinical suspicion for acute cholecystitis, follow-up with HIDA scan is recommended. 2. Nodular appearance of the liver which can be seen in patients with cirrhosis. There is no discrete hepatic mass. 3. Echogenic right kidney suggestive of underlying medical  renal disease. 4. Right-sided pelviectasis of unknown clinical significance. This appears to be new since the patient's prior CT dated 11/07/2019 5. Small  volume abdominal ascites. Electronically Signed   By: Constance Holster M.D.   On: 11/16/2019 17:14     TODAY-DAY OF DISCHARGE:  Subjective:   Vanessa [name] today has no headache,no chest abdominal pain,no new weakness tingling or numbness, feels much better wants to go home today.   Objective:   Blood pressure 138/83, pulse 70, temperature (!) 93.5 F (34.2 C), temperature source Axillary, resp. rate 20, height 5' (1.524 m), weight 96 kg, last menstrual period 03/17/2015, SpO2 92 %.  Intake/Output Summary (Last 24 hours) at 11/22/2019 0936 Last data filed at 11/22/2019 0900 Gross per 24 hour  Intake 480 ml  Output 1150 ml  Net -670 ml   Filed Weights   11/20/19 0433 11/21/19 0500 11/22/19 0559  Weight: 94.3 kg 94.7 kg 96 kg    Exam: Awake Alert, Oriented *3, No new F.N deficits, Normal affect [place].AT,PERRAL Supple Neck,No JVD, No cervical lymphadenopathy appriciated.  Symmetrical Chest wall movement, Good air movement bilaterally, CTAB RRR,No Gallops,Rubs or new Murmurs, No Parasternal Heave +ve B.Sounds, Abd Soft, Non tender, No organomegaly appriciated, No rebound -guarding or rigidity. No Cyanosis, Clubbing or edema, No new Rash or bruise   PERTINENT RADIOLOGIC STUDIES: CT ABDOMEN PELVIS WO CONTRAST  Result Date: 11/07/2019 CLINICAL DATA:  Generalized weakness and lethargy. Sepsis. Ascites. Cough. COVID positive yesterday. EXAM: CT CHEST, ABDOMEN AND PELVIS WITHOUT CONTRAST TECHNIQUE: Multidetector CT imaging of the chest, abdomen and pelvis was performed following the standard protocol without IV contrast. COMPARISON:  Radiograph yesterday. Abdominopelvic CT 09/15/2019 FINDINGS: CT CHEST FINDINGS Cardiovascular: Right upper extremity PICC with tip in the SVC. Motion artifact partially obscures detailed evaluation. Normal  caliber thoracic aorta. Mild atherosclerosis. Mild cardiomegaly. No pericardial effusion. Mediastinum/Nodes: Motion and lack of contrast limits assessment for adenopathy. Small mediastinal lymph nodes not definitively enlarged. Assessment for hilar adenopathy is significantly limited. Esophagus is patulous. No suspicious thyroid nodule. Lungs/Pleura: Moderate right and small left pleural effusion. Adjacent compressive atelectasis. There are multifocal geographic patchy ground-glass opacities throughout both lungs. Trachea and mainstem bronchi are grossly patent, partially motion obscured. Musculoskeletal: Generalized subcutaneous edema throughout the chest wall. Diffuse degenerative change throughout the thoracic spine. There are no acute or suspicious osseous abnormalities. CT ABDOMEN PELVIS FINDINGS Hepatobiliary: No evidence of focal hepatic abnormality on noncontrast exam. Liver is prominent size spanning 20 cm cranial caudal. Question of nodular hepatic contours. Hyperdense gallbladder contents. No calcified gallstone. Common bile duct is not well-defined. Pancreas: No pancreatic ductal dilatation. No disproportionate peripancreatic edema. Spleen: Normal in size without focal abnormality. Adrenals/Urinary Tract: Adrenal thickening without dominant nodule. Chronic left renal atrophy. Left ureter is nondilated, unchanged 7 mm calcification in the left pelvis equivocally within the ureter. Right kidney appears normal in size. No hydronephrosis or renal calculi. Urinary bladder is distended and unremarkable. Stomach/Bowel: Bowel evaluation is limited in the absence of contrast, presence of ascites, and motion artifact. Nondistended stomach, grossly unremarkable. No small bowel obstruction. Appendix not visualized. Moderate volume of stool throughout the colon. There is mild colonic redundancy. No obvious colonic wall thickening or pericolonic inflammation. Vascular/Lymphatic: Abdominal aorta is normal in caliber.  No bulky abdominopelvic adenopathy. Reproductive: Uterus is slightly bulky. Limited assessment for adnexal mass given pelvic ascites. Other: Small to moderate volume abdominopelvic ascites. There is generalized edema of the intra-abdominal fat. Prominent generalized edema of the subcutaneous fat, confluent in the flanks. No free intra-abdominal air. Musculoskeletal: Degenerative change in the lumbar spine. There are no acute or suspicious osseous abnormalities. IMPRESSION: 1. Multifocal geographic  patchy ground-glass opacities throughout both lungs, consistent with COVID-19 pneumonia. 2. Fluid overload. Moderate right and small left pleural effusion. Small to moderate volume abdominopelvic ascites. Generalized subcutaneous edema of the chest and abdomen consistent with third-spacing, confluent edema in the flanks. 3. Question of nodular hepatic contours, recommend correlation for cirrhosis. 4. Chronic left renal atrophy. Unchanged 7 mm calcification in the left pelvis equivocally within the ureter. No hydronephrosis. Aortic Atherosclerosis (ICD10-I70.0). Electronically Signed   By: Keith Rake M.D.   On: 11/07/2019 23:00   CT CHEST WO CONTRAST  Result Date: 11/07/2019 CLINICAL DATA:  Generalized weakness and lethargy. Sepsis. Ascites. Cough. COVID positive yesterday. EXAM: CT CHEST, ABDOMEN AND PELVIS WITHOUT CONTRAST TECHNIQUE: Multidetector CT imaging of the chest, abdomen and pelvis was performed following the standard protocol without IV contrast. COMPARISON:  Radiograph yesterday. Abdominopelvic CT 09/15/2019 FINDINGS: CT CHEST FINDINGS Cardiovascular: Right upper extremity PICC with tip in the SVC. Motion artifact partially obscures detailed evaluation. Normal caliber thoracic aorta. Mild atherosclerosis. Mild cardiomegaly. No pericardial effusion. Mediastinum/Nodes: Motion and lack of contrast limits assessment for adenopathy. Small mediastinal lymph nodes not definitively enlarged. Assessment for  hilar adenopathy is significantly limited. Esophagus is patulous. No suspicious thyroid nodule. Lungs/Pleura: Moderate right and small left pleural effusion. Adjacent compressive atelectasis. There are multifocal geographic patchy ground-glass opacities throughout both lungs. Trachea and mainstem bronchi are grossly patent, partially motion obscured. Musculoskeletal: Generalized subcutaneous edema throughout the chest wall. Diffuse degenerative change throughout the thoracic spine. There are no acute or suspicious osseous abnormalities. CT ABDOMEN PELVIS FINDINGS Hepatobiliary: No evidence of focal hepatic abnormality on noncontrast exam. Liver is prominent size spanning 20 cm cranial caudal. Question of nodular hepatic contours. Hyperdense gallbladder contents. No calcified gallstone. Common bile duct is not well-defined. Pancreas: No pancreatic ductal dilatation. No disproportionate peripancreatic edema. Spleen: Normal in size without focal abnormality. Adrenals/Urinary Tract: Adrenal thickening without dominant nodule. Chronic left renal atrophy. Left ureter is nondilated, unchanged 7 mm calcification in the left pelvis equivocally within the ureter. Right kidney appears normal in size. No hydronephrosis or renal calculi. Urinary bladder is distended and unremarkable. Stomach/Bowel: Bowel evaluation is limited in the absence of contrast, presence of ascites, and motion artifact. Nondistended stomach, grossly unremarkable. No small bowel obstruction. Appendix not visualized. Moderate volume of stool throughout the colon. There is mild colonic redundancy. No obvious colonic wall thickening or pericolonic inflammation. Vascular/Lymphatic: Abdominal aorta is normal in caliber. No bulky abdominopelvic adenopathy. Reproductive: Uterus is slightly bulky. Limited assessment for adnexal mass given pelvic ascites. Other: Small to moderate volume abdominopelvic ascites. There is generalized edema of the intra-abdominal fat.  Prominent generalized edema of the subcutaneous fat, confluent in the flanks. No free intra-abdominal air. Musculoskeletal: Degenerative change in the lumbar spine. There are no acute or suspicious osseous abnormalities. IMPRESSION: 1. Multifocal geographic patchy ground-glass opacities throughout both lungs, consistent with COVID-19 pneumonia. 2. Fluid overload. Moderate right and small left pleural effusion. Small to moderate volume abdominopelvic ascites. Generalized subcutaneous edema of the chest and abdomen consistent with third-spacing, confluent edema in the flanks. 3. Question of nodular hepatic contours, recommend correlation for cirrhosis. 4. Chronic left renal atrophy. Unchanged 7 mm calcification in the left pelvis equivocally within the ureter. No hydronephrosis. Aortic Atherosclerosis (ICD10-I70.0). Electronically Signed   By: Keith Rake M.D.   On: 11/07/2019 23:00   DG CHEST PORT 1 VIEW  Result Date: 11/16/2019 CLINICAL DATA:  Shortness of breath.  COVID-19 positive EXAM: PORTABLE CHEST 1 VIEW COMPARISON:  Nov 09, 2019 FINDINGS: Central catheter tip is in the superior vena cava. No pneumothorax. Airspace opacity is noted bilaterally, more extensive on the right than on the left. There has been slight interval clearing on the left without appreciable change on the right compared to most recent study. This opacity is primarily in a perihilar distribution bilaterally. There is a small layering right pleural effusion. There is cardiomegaly with pulmonary venous hypertension. No adenopathy. No bone lesions. IMPRESSION: Cardiomegaly with pulmonary vascular congestion. Airspace opacity is noted bilaterally, more on the right than on the left with partial clearing from the left upper lobe and left base compared to previous study. No change on the right. Small right pleural effusion. Suspect multifocal pneumonia, although there may well be superimposed pulmonary edema. Question both pneumonia and  congestive heart failure concurrent. Electronically Signed   By: Lowella Grip III M.D.   On: 11/16/2019 09:49   DG Chest Port 1 View  Result Date: 11/09/2019 CLINICAL DATA:  Weakness, COVID-19 positive EXAM: PORTABLE CHEST 1 VIEW COMPARISON:  Chest radiograph from one day prior. FINDINGS: Right PICC terminates in the lower third of the SVC. Stable cardiomediastinal silhouette with mild cardiomegaly. No pneumothorax. No pleural effusion. Extensive patchy opacities throughout both lungs, slightly worsened in the upper lungs. IMPRESSION: Extensive patchy opacities throughout both lungs, slightly worsened in the upper lungs, compatible with COVID-19 pneumonia, although with component of pulmonary edema not excluded. Mild cardiomegaly. Electronically Signed   By: Ilona Sorrel M.D.   On: 11/09/2019 08:19   DG Chest Port 1 View  Result Date: 11/08/2019 CLINICAL DATA:  Shortness of breath, COVID-19, diabetes mellitus, hypertension, stage III chronic kidney disease, history endometrial cancer EXAM: PORTABLE CHEST 1 VIEW COMPARISON:  Portable exam 0816 hours compared to 11/06/2019 FINDINGS: RIGHT arm PICC line tip projects over SVC. Enlargement of cardiac silhouette with stable mediastinal contours. Extensive BILATERAL pulmonary infiltrates greatest perihilar on RIGHT, question asymmetric pulmonary edema versus multifocal infection. No pleural effusion or pneumothorax. Bones demineralized with scattered degenerative changes of the thoracic spine. IMPRESSION: Enlargement of cardiac silhouette with increased BILATERAL pulmonary infiltrates greatest RIGHT perihilar question asymmetric pulmonary edema versus multifocal pneumonia. Electronically Signed   By: Lavonia Dana M.D.   On: 11/08/2019 08:51   DG CHEST PORT 1 VIEW  Result Date: 11/06/2019 CLINICAL DATA:  Weakness. EXAM: PORTABLE CHEST 1 VIEW COMPARISON:  November 05, 2019. FINDINGS: Stable cardiomegaly with possible central pulmonary vascular congestion. No  pneumothorax is noted. Mild bibasilar atelectasis is noted with small pleural effusions. Bony thorax is unremarkable. IMPRESSION: Stable cardiomegaly with possible central pulmonary vascular congestion. Mild bibasilar subsegmental atelectasis is noted with small pleural effusions. Electronically Signed   By: Marijo Conception M.D.   On: 11/06/2019 15:57   DG Chest Port 1 View  Result Date: 11/05/2019 CLINICAL DATA:  Weakness. Found on floor today. EXAM: PORTABLE CHEST 1 VIEW COMPARISON:  10/18/2019 FINDINGS: Stable heart size and mediastinal contours with mild cardiomegaly. Hazy opacity at both lung bases likely combination of pleural fluid and airspace disease/atelectasis. Vascular congestion. No pneumothorax. Left proximal humerus fracture appears subacute with surrounding heterotopic ossification, also seen on 09/17/2019 shoulder radiograph. IMPRESSION: 1. Hazy opacity at both lung bases likely combination of pleural fluid and airspace disease/atelectasis. This is stable on the right but progressive on the left from prior. 2. Stable mild cardiomegaly.  Vascular congestion. Electronically Signed   By: Keith Rake M.D.   On: 11/05/2019 17:02   VAS [place] LOWER EXTREMITY VENOUS (DVT)  Result  Date: 11/07/2019  Lower Venous DVTStudy Indications: Swelling, and Edema.  Limitations: Body habitus and poor ultrasound/tissue interface. Comparison Study: 09/16/19 previous Performing Technologist: Abram Sander RVS  Examination Guidelines: A complete evaluation includes B-mode imaging, spectral Doppler, color Doppler, and power Doppler as needed of all accessible portions of each vessel. Bilateral testing is considered an integral part of a complete examination. Limited examinations for reoccurring indications may be performed as noted. The reflux portion of the exam is performed with the patient in reverse Trendelenburg.  +---------+---------------+---------+-----------+----------+--------------+ RIGHT     CompressibilityPhasicitySpontaneityPropertiesThrombus Aging +---------+---------------+---------+-----------+----------+--------------+ CFV      Full           Yes      Yes                                 +---------+---------------+---------+-----------+----------+--------------+ SFJ      Full                                                        +---------+---------------+---------+-----------+----------+--------------+ FV Prox  Full                                                        +---------+---------------+---------+-----------+----------+--------------+ FV Mid                                                Not visualized +---------+---------------+---------+-----------+----------+--------------+ FV Distal               Yes      Yes                                 +---------+---------------+---------+-----------+----------+--------------+ PFV      Full                                                        +---------+---------------+---------+-----------+----------+--------------+ POP      Full           Yes      Yes                                 +---------+---------------+---------+-----------+----------+--------------+ PTV                                                   Not visualized +---------+---------------+---------+-----------+----------+--------------+ PERO  Not visualized +---------+---------------+---------+-----------+----------+--------------+   +---------+---------------+---------+-----------+----------+--------------+ LEFT     CompressibilityPhasicitySpontaneityPropertiesThrombus Aging +---------+---------------+---------+-----------+----------+--------------+ CFV      Full           Yes      Yes                                 +---------+---------------+---------+-----------+----------+--------------+ SFJ      Full                                                         +---------+---------------+---------+-----------+----------+--------------+ FV Prox  Full                                                        +---------+---------------+---------+-----------+----------+--------------+ FV Mid                  Yes      Yes                                 +---------+---------------+---------+-----------+----------+--------------+ FV Distal               Yes      Yes                                 +---------+---------------+---------+-----------+----------+--------------+ PFV      Full                                                        +---------+---------------+---------+-----------+----------+--------------+ POP      Full           Yes      Yes                                 +---------+---------------+---------+-----------+----------+--------------+ PTV      Full                                                        +---------+---------------+---------+-----------+----------+--------------+ PERO                                                  Not visualized +---------+---------------+---------+-----------+----------+--------------+     Summary: BILATERAL: - No evidence of deep vein thrombosis seen in the lower extremities, bilaterally.   *See table(s) above for measurements and observations. Electronically signed by Curt Jews MD on 11/07/2019 at 4:26:57 PM.    Final    [place] EKG SITE RITE  Result Date: 11/07/2019  If [employer] not attached, placement could not be confirmed due to current cardiac rhythm.  US Abdomen Limited RUQ  Result Date: 11/16/2019 CLINICAL DATA:  Cirrhosis EXAM: ULTRASOUND ABDOMEN LIMITED RIGHT UPPER QUADRANT COMPARISON:  11/07/2019 FINDINGS: Gallbladder: The gallbladder wall is thickened measuring approximately 8.6 mm in thickness. The sonographic Murphy sign cannot be adequately assessed. There is gallbladder sludge. There is some mild pericholecystic free fluid. Common bile  duct: Diameter: 6 mm Liver: The liver surface is nodular. There is no discrete hepatic mass. Portal vein is patent on color Doppler imaging with normal direction of blood flow towards the liver. Other: There is a small volume of ascites in the upper abdomen. There is right-sided pelviectasis. The right kidney is echogenic. IMPRESSION: 1. There is gallbladder sludge with associated gallbladder wall thickening. The sonographic Murphy sign cannot be adequately assessed. If there is high clinical suspicion for acute cholecystitis, follow-up with HIDA scan is recommended. 2. Nodular appearance of the liver which can be seen in patients with cirrhosis. There is no discrete hepatic mass. 3. Echogenic right kidney suggestive of underlying medical renal disease. 4. Right-sided pelviectasis of unknown clinical significance. This appears to be new since the patient's prior CT dated 11/07/2019 5. Small volume abdominal ascites. Electronically Signed   By: Constance Holster M.D.   On: 11/16/2019 17:14     PERTINENT LAB RESULTS: CBC: Recent Labs    11/20/19 0459  WBC 6.1  HGB 8.5*  HCT 28.2*  PLT 190   CMET CMP     Component Value Date/Time   NA 143 11/20/2019 0459   K 3.9 11/20/2019 0459   CL 107 11/20/2019 0459   CO2 29 11/20/2019 0459   GLUCOSE 166 (H) 11/20/2019 0459   BUN 34 (H) 11/20/2019 0459   CREATININE 1.66 (H) 11/20/2019 0459   CREATININE 1.21 (H) 08/15/2016 1656   CALCIUM 9.0 11/20/2019 0459   PROT 6.0 (L) 11/19/2019 0255   ALBUMIN 2.9 (L) 11/19/2019 0255   AST 12 (L) 11/19/2019 0255   ALT 8 11/19/2019 0255   ALKPHOS 81 11/19/2019 0255   BILITOT 1.0 11/19/2019 0255   GFRNONAA 32 (L) 11/20/2019 0459   GFRNONAA 48 (L) 08/15/2016 1656   GFRAA 37 (L) 11/20/2019 0459   GFRAA 56 (L) 08/15/2016 1656    GFR Estimated Creatinine Clearance: 35 mL/min (A) (by C-G formula based on SCr of 1.66 mg/dL (H)). No results for input(s): LIPASE, AMYLASE in the last 72 hours. No results for  input(s): CKTOTAL, CKMB, CKMBINDEX, TROPONINI in the last 72 hours. Invalid input(s): POCBNP No results for input(s): DDIMER in the last 72 hours. No results for input(s): HGBA1C in the last 72 hours. No results for input(s): CHOL, HDL, LDLCALC, TRIG, CHOLHDL, LDLDIRECT in the last 72 hours. No results for input(s): TSH, T4TOTAL, T3FREE, THYROIDAB in the last 72 hours.  Invalid input(s): FREET3 No results for input(s): VITAMINB12, FOLATE, FERRITIN, TIBC, IRON, RETICCTPCT in the last 72 hours. Coags: No results for input(s): INR in the last 72 hours.  Invalid input(s): PT Microbiology: Recent Results (from the past 240 hour(s))  Culture, blood (routine x 2)     Status: None   Collection Time: 11/13/19  6:09 PM   Specimen: BLOOD  Result Value Ref Range Status   Specimen Description BLOOD LEFT ANTECUBITAL  Final   Special Requests   Final    BOTTLES DRAWN AEROBIC AND ANAEROBIC Blood Culture adequate volume   Culture   Final    NO GROWTH  5 DAYS Performed at [facility], [place] [address] [place], [place] [address]    Report Status 11/18/2019 FINAL  Final  Culture, blood (routine x 2)     Status: None   Collection Time: 11/13/19  6:16 PM   Specimen: BLOOD LEFT HAND  Result Value Ref Range Status   Specimen Description BLOOD LEFT HAND  Final   Special Requests   Final    BOTTLES DRAWN AEROBIC AND ANAEROBIC Blood Culture adequate volume   Culture   Final    NO GROWTH 5 DAYS Performed at [facility], [place] [address], [place], [place] [address]    Report Status 11/18/2019 FINAL  Final    FURTHER DISCHARGE INSTRUCTIONS:  Get Medicines reviewed and adjusted: Please take all your medications with you for your next visit with your Primary MD  Laboratory/radiological data: Please request your Primary MD to go over all hospital tests and procedure/radiological results at the follow up, please ask your Primary MD to get all Hospital records sent to his/her office.  In some  cases, they will be blood work, cultures and biopsy results pending at the time of your discharge. Please request that your primary care M.D. goes through all the records of your hospital data and follows up on these results.  Also Note the following: If you experience worsening of your admission symptoms, develop shortness of breath, life threatening emergency, suicidal or homicidal thoughts you must seek medical attention immediately by calling 911 or calling your MD immediately  if symptoms less severe.  You must read complete instructions/literature along with all the possible adverse reactions/side effects for all the Medicines you take and that have been prescribed to you. Take any new Medicines after you have completely understood and accpet all the possible adverse reactions/side effects.   Do not drive when taking Pain medications or sleeping medications (Benzodaizepines)  Do not take more than prescribed Pain, Sleep and Anxiety Medications. It is not advisable to combine anxiety,sleep and pain medications without talking with your primary care practitioner  Special Instructions: If you have smoked or chewed Tobacco  in the last 2 yrs please stop smoking, stop any regular Alcohol  and or any Recreational drug use.  Wear Seat belts while driving.  Please note: You were cared for by a hospitalist during your hospital stay. Once you are discharged, your primary care physician will handle any further medical issues. Please note that NO REFILLS for any discharge medications will be authorized once you are discharged, as it is imperative that you return to your primary care physician (or establish a relationship with a primary care physician if you do not have one) for your post hospital discharge needs so that they can reassess your need for medications and monitor your lab values.  Total Time spent coordinating discharge including counseling, education and face to face time equals 35  minutes.  SignedOren Binet 11/22/2019 9:36 AM

## 2019-11-21 NOTE — Plan of Care (Signed)
  Problem: Clinical Measurements: Goal: Respiratory complications will improve Outcome: Progressing   

## 2019-11-21 NOTE — TOC Progression Note (Signed)
Transition of Care [facility]) - Progression Note    Patient Details  Name: [name] MRN: [id number] Date of Birth: 01/20/1956  Transition of Care [facility]) CM/SW [place], LCSW Phone Number: 11/21/2019, 11:10 AM  Clinical Narrative:    Still awaiting insurance approval for [facility] RN (Fee) spoke with Dr. Sloan Leiter and confirmed medical acceptance. Patient updated.    Expected Discharge Plan: Skilled Nursing Facility Barriers to Discharge: Insurance Authorization  Expected Discharge Plan and Services Expected Discharge Plan: [place] In-house Referral: Clinical Social Work Discharge Planning Services: CM Consult Post Acute Care Choice: [place] Living arrangements for the past 2 months: [place] Expected Discharge Date: 11/21/19                                     Social Determinants of Health (SDOH) Interventions    Readmission Risk Interventions Readmission Risk Prevention Plan 11/08/2019 10/21/2019 09/25/2019  Transportation Screening Complete Complete Complete  PCP or Specialist Appt within 3-5 Days - - Not Complete  Not Complete comments - - plan for SNF  [place] or [place] - - Complete  Social Work Consult for [place] Planning/Counseling - - Complete  Palliative Care Screening - - Not Applicable  Medication Review Press photographer) Complete Referral to Pharmacy Referral to Pharmacy  PCP or Specialist appointment within 3-5 days of discharge Complete Complete -  [place] or Home Care Consult Complete (No Data) -  SW Recovery Care/Counseling Consult Complete Complete -  Palliative Care Screening Not Applicable Not Applicable -  Skilled Nursing Facility Complete Complete -  Some recent data might be hidden

## 2019-11-22 ENCOUNTER — Encounter: Payer: Self-pay | Admitting: Adult Health

## 2019-11-22 ENCOUNTER — Ambulatory Visit: Payer: Medicare Other | Admitting: Gastroenterology

## 2019-11-22 ENCOUNTER — Non-Acute Institutional Stay (SKILLED_NURSING_FACILITY): Payer: Medicare Other | Admitting: Adult Health

## 2019-11-22 DIAGNOSIS — E1122 Type 2 diabetes mellitus with diabetic chronic kidney disease: Secondary | ICD-10-CM

## 2019-11-22 DIAGNOSIS — K269 Duodenal ulcer, unspecified as acute or chronic, without hemorrhage or perforation: Secondary | ICD-10-CM

## 2019-11-22 DIAGNOSIS — C541 Malignant neoplasm of endometrium: Secondary | ICD-10-CM

## 2019-11-22 DIAGNOSIS — K029 Dental caries, unspecified: Secondary | ICD-10-CM

## 2019-11-22 DIAGNOSIS — D500 Iron deficiency anemia secondary to blood loss (chronic): Secondary | ICD-10-CM

## 2019-11-22 DIAGNOSIS — J1282 Pneumonia due to coronavirus disease 2019: Secondary | ICD-10-CM

## 2019-11-22 DIAGNOSIS — N1832 Chronic kidney disease, stage 3b: Secondary | ICD-10-CM

## 2019-11-22 DIAGNOSIS — U071 COVID-19: Secondary | ICD-10-CM

## 2019-11-22 DIAGNOSIS — R600 Localized edema: Secondary | ICD-10-CM

## 2019-11-22 DIAGNOSIS — R531 Weakness: Secondary | ICD-10-CM

## 2019-11-22 DIAGNOSIS — E785 Hyperlipidemia, unspecified: Secondary | ICD-10-CM

## 2019-11-22 DIAGNOSIS — A599 Trichomoniasis, unspecified: Secondary | ICD-10-CM

## 2019-11-22 DIAGNOSIS — T68XXXS Hypothermia, sequela: Secondary | ICD-10-CM

## 2019-11-22 DIAGNOSIS — E1121 Type 2 diabetes mellitus with diabetic nephropathy: Secondary | ICD-10-CM

## 2019-11-22 DIAGNOSIS — I10 Essential (primary) hypertension: Secondary | ICD-10-CM

## 2019-11-22 LAB — GLUCOSE, CAPILLARY: Glucose-Capillary: 150 mg/dL — ABNORMAL HIGH (ref 70–99)

## 2019-11-22 NOTE — Plan of Care (Signed)
  Problem: Respiratory: Goal: Will maintain a patent airway Outcome: Progressing Goal: Complications related to the disease process, condition or treatment will be avoided or minimized Outcome: Progressing   

## 2019-11-22 NOTE — Progress Notes (Signed)
Patient was seen and examined-vital signs stable-she had 1 normal temperature reading last night.  She remains essentially stable and unchanged.  Please see discharge summary from yesterday-medications remain the same.  Plan outlined in the discharge summary remains the same.

## 2019-11-22 NOTE — Progress Notes (Signed)
Location:  [facility] Room Number: 318-A Place of Service:  SNF (31) Provider:  Durenda Age, DNP, FNP-BC  Patient Care Team: Maren Reamer, MD (Inactive) as PCP - General (Internal Medicine)  Extended Emergency Contact Information Primary Emergency Contact: Jennette Dubin of [place] Phone: [phone] Mobile Phone: [phone] Relation: Spouse  Code Status:  Full Code  Goals of care: Advanced Directive information Advanced Directives 11/06/2019  Does Patient Have a Medical Advance Directive? No  Would patient like information on creating a medical advance directive? No - Patient declined     Chief Complaint  Patient presents with   Acute Visit    Patient is seen for hospital followup    HPI:  Pt is a 64 y.o. female who was admitted to [facility] today, 11/22/19, post [facility] hospitalization 11/05/19 to 11/22/19 . She has a PMH of hypertension, diabetes mellitus, hyperlipidemia, endometrial cancer, CKD stage IIIb and chronic diastolic failure. Of note, she was recently hospitalized on 4/09 to 4/12 for acute onset and edema/diffuse anasarca with questionable concurrent heart failure exacerbation versus new medication allergic reaction (metronidazole) which she was prescribed due to trichomonas noted on Pap smear.  Her labetalol was replaced with Coreg due to elevated blood pressure.  Lasix was started.  She was discharged to SNF and finished cefazolin IV every 12 hours for MSSA bacteremia on 10/31/2019.  She was then discharged to home but was unable to take care of herself due to generalized weakness.    She presented to the hospital on 4/27 with weakness, fever and was found to have sepsis secondary to COVID-19 pneumonia.  She was treated with vancomycin and Zosyn then transitioned to doxycycline.  She completed Flagyl treatment for Trichomonas while in the hospital.  She was admitted to [facility] for a  short-term rehabilitation.  She was seen in the room today. No SOB noted.She verbalized feeling week when walking. Patient said that she was not allergic to Flagyl and the swelling of her face was due to her multiple decayed teeth. She has completed Flagyl treatment while in the hospital.  Past Medical History:  Diagnosis Date   Anemia 10/09/2019   Asthma    CKD (chronic kidney disease) stage 3, GFR 30-59 ml/min    Dental disease    Diabetes mellitus without complication ([place])    Endometrial cancer ([place])    Esophageal dysmotility    Heart murmur    HLD (hyperlipidemia)    Hypertension    MSSA bacteremia 09/2019   Obesity, Class III, BMI 40-49.9 (morbid obesity) ([place])    Pressure ulcer    Past Surgical History:  Procedure Laterality Date   BIOPSY  10/08/2019   Procedure: BIOPSY;  Surgeon: Clarene Essex, MD;  Location: [address];  Service: Endoscopy;;   ESOPHAGOGASTRODUODENOSCOPY N/A 10/08/2019   Procedure: ESOPHAGOGASTRODUODENOSCOPY (EGD);  Surgeon: Clarene Essex, MD;  Location: [facility];  Service: Endoscopy;  Laterality: N/A;   TYMPANOSTOMY TUBE PLACEMENT Bilateral     No Known Allergies  Outpatient Encounter Medications as of 11/22/2019  Medication Sig   acetaminophen (TYLENOL) 500 MG tablet Take 500 mg by mouth every 6 (six) hours as needed for moderate pain.   Amino Acids-Protein Hydrolys (FEEDING SUPPLEMENT, PRO-STAT SUGAR FREE 64,) LIQD Take 30 mLs by mouth 3 (three) times daily with meals.   amLODipine (NORVASC) 10 MG tablet Take 1 tablet (10 mg total) by mouth daily.   carvedilol (COREG) 12.5 MG tablet Take 1 tablet (12.5  mg total) by mouth 2 (two) times daily with a meal.   Chlorhexidine Gluconate Cloth 2 % PADS Apply 6 each topically daily.   cloNIDine (CATAPRES) 0.2 MG tablet Take 1 tablet (0.2 mg total) by mouth 2 (two) times daily.   feeding supplement, ENSURE ENLIVE, (ENSURE ENLIVE) LIQD Take 237 mLs by mouth 3 (three) times daily between  meals.   ferrous sulfate 325 (65 FE) MG tablet Take 325 mg by mouth 2 (two) times daily with a meal.   furosemide (LASIX) 40 MG tablet Take 40 mg by mouth daily.   hydrALAZINE (APRESOLINE) 50 MG tablet Take 2 tablets (100 mg total) by mouth every 8 (eight) hours.   loratadine (CLARITIN) 10 MG tablet Take 10 mg by mouth daily as needed for allergies.   pantoprazole sodium (PROTONIX) 40 mg/20 mL PACK Take 20 mLs (40 mg total) by mouth daily.   polyethylene glycol (MIRALAX / GLYCOLAX) 17 g packet Take 17 g by mouth daily.   potassium chloride (KLOR-CON) 10 MEQ tablet Take 2 tablets (20 mEq total) by mouth daily.   Blood Glucose Monitoring Suppl (BAYER CONTOUR MONITOR) w/Device KIT Use as directed for 3 times daily testing of blood glucose. E11.9 (Patient not taking: Reported on 11/22/2019)   glucose blood (BAYER CONTOUR TEST) test strip Use as instructed for 3 times daily testing of blood glucose. E11.9 (Patient not taking: Reported on 11/22/2019)   INSULIN SYRINGE 1CC/29G 29G X 1/2" 1 ML MISC Insulin syringes (Patient not taking: Reported on 11/22/2019)   Lancets (ONETOUCH ULTRASOFT) lancets Use as instructed (Patient not taking: Reported on 11/22/2019)   [DISCONTINUED] benzocaine (ORAJEL) 10 % mucosal gel Use as directed 1 application in the mouth or throat 4 (four) times daily as needed for mouth pain.   [DISCONTINUED] diphenhydrAMINE (BENADRYL) 25 MG tablet Take 25 mg by mouth once.   [DISCONTINUED] ferrous sulfate 325 (65 FE) MG tablet Take 1 tablet (325 mg total) by mouth 2 (two) times daily with a meal.   [DISCONTINUED] furosemide (LASIX) 40 MG tablet Take 1 tablet (40 mg total) by mouth daily.   [DISCONTINUED] levonorgestrel (MIRENA) 20 MCG/24HR IUD 1 Intra Uterine Device (1 each total) by Intrauterine route once for 1 dose. To be placed in the office   Facility-Administered Encounter Medications as of 11/22/2019  Medication   acetaminophen (TYLENOL) tablet 650 mg   albuterol  (VENTOLIN HFA) 108 (90 Base) MCG/ACT inhaler 2 puff   amLODipine (NORVASC) tablet 10 mg   ascorbic acid (VITAMIN C) tablet 500 mg   capsaicin (ZOSTRIX) 0.025 % cream   Chlorhexidine Gluconate Cloth 2 % PADS 6 each   cloNIDine (CATAPRES) tablet 0.2 mg   doxazosin (CARDURA) tablet 4 mg   feeding supplement (ENSURE ENLIVE) (ENSURE ENLIVE) liquid 237 mL   feeding supplement (PRO-STAT SUGAR FREE 64) liquid 30 mL   ferrous sulfate tablet 325 mg   furosemide (LASIX) tablet 40 mg   guaiFENesin-dextromethorphan (ROBITUSSIN DM) 100-10 MG/5ML syrup 10 mL   heparin injection 5,000 Units   hydrALAZINE (APRESOLINE) tablet 100 mg   insulin aspart (novoLOG) injection 0-9 Units   loratadine (CLARITIN) tablet 10 mg   MEDLINE mouth rinse   metroNIDAZOLE (FLAGYL) tablet 500 mg   ondansetron (ZOFRAN) injection 4 mg   pantoprazole sodium (PROTONIX) 40 mg/20 mL oral suspension 40 mg   polyethylene glycol (MIRALAX / GLYCOLAX) packet 17 g   sodium chloride flush (NS) 0.9 % injection 10-40 mL   spironolactone (ALDACTONE) tablet 50 mg   zinc  sulfate capsule 220 mg    Review of Systems  GENERAL: No change in appetite, no fatigue, no weight changes, no fever or chills  MOUTH and THROAT: Denies oral discomfort, gingival pain or bleeding, pain from teeth or hoarseness   RESPIRATORY: no cough, SOB, DOE, wheezing, hemoptysis CARDIAC: No chest pain or palpitations GI: No abdominal pain, diarrhea, constipation, heart burn, nausea or vomiting GU: Denies dysuria, frequency, hematuria, incontinence, or discharge NEUROLOGICAL: Denies dizziness, syncope, numbness, or headache PSYCHIATRIC: Denies feelings of depression or anxiety. No report of hallucinations, insomnia, paranoia, or agitation   Immunization History  Administered Date(s) Administered   Influenza,inj,Quad PF,6+ Mos 08/03/2015, 03/24/2016   PFIZER SARS-COV-2 Vaccination 09/12/2019   Pneumococcal Polysaccharide-23 03/24/2016     Tdap 08/15/2016   Pertinent  Health Maintenance Due  Topic Date Due   MAMMOGRAM  Never done   COLONOSCOPY  Never done   OPHTHALMOLOGY EXAM  09/08/2017   FOOT EXAM  11/29/2017   DEXA SCAN  Never done   PNA vac Low Risk Adult (1 of 2 - PCV13) 08/23/2019   INFLUENZA VACCINE  02/09/2020   HEMOGLOBIN A1C  03/17/2020   PAP SMEAR-Modifier  09/19/2022   Fall Risk  12/21/2015 04/06/2014 03/10/2014  Falls in the past year? No No No     Vitals:   11/22/19 1420  BP: 132/75  Pulse: 72  Resp: 20  Temp: (!) 97.2 F (36.2 C)  TempSrc: Oral  SpO2: 94%  Weight: 201 lb 3.2 oz (91.3 kg)  Height: 5' (1.524 m)   Body mass index is 39.29 kg/m.  Physical Exam  GENERAL APPEARANCE: Well nourished. In no acute distress. Morbidly obese. SKIN:  Skin is warm and dry.  MOUTH and THROAT: Lips are without lesions. Oral mucosa is moist and without lesions. Tongue is normal in shape, size, and color and without lesions RESPIRATORY: Breathing is even & unlabored, BS CTAB CARDIAC: RRR, no murmur,no extra heart sounds, BLE 2+ edema GI: Abdomen soft, normal BS, no masses, no tenderness EXTREMITIES:  Able to move X 4 extremities NEUROLOGICAL: There is no tremor. Speech is clear. Alert and oriented X 3. PSYCHIATRIC:  Affect and behavior are appropriate  Labs reviewed: Recent Labs    09/15/19 0616 09/15/19 0616 09/16/19 0139 09/17/19 0317 11/07/19 0441 11/08/19 0437 11/12/19 0519 11/12/19 0519 11/13/19 0547 11/13/19 0547 11/14/19 0259 11/15/19 0206 11/18/19 0328 11/19/19 0255 11/20/19 0459  NA 140   < > 142   < > 149*   < > 146*   < > 146*   < > 147*   < > 152* 149* 143  K 2.7*   < > 3.5   < > 3.2*   < > 3.1*   < > 3.0*   < > 3.7   < > 3.5 3.6 3.9  CL 109   < > 113*   < > 112*   < > 108   < > 110   < > 113*   < > 114* 112* 107  CO2 19*   < > 19*   < > 26   < > 25   < > 25   < > 25   < > 30 29 29   GLUCOSE 92   < > 126*   < > 110*   < > 130*   < > 126*   < > 138*   < > 134* 149*  166*  BUN 23   < > 27*   < > 28*   < >  42*   < > 44*   < > 48*   < > 42* 38* 34*  CREATININE 1.78*   < > 2.02*   < > 1.76*   < > 1.79*   < > 1.77*   < > 1.67*   < > 1.70* 1.67* 1.66*  CALCIUM 8.3*   < > 8.3*   < > 8.5*   < > 8.3*   < > 8.3*   < > 8.0*   < > 9.0 8.9 9.0  MG 2.1   < > 2.2  --  1.8   < > 1.7  --  1.8  --  1.8  --   --   --   --   PHOS 3.9  --  3.3  --  4.2  --   --   --   --   --   --   --   --   --   --    < > = values in this interval not displayed.   Recent Labs    11/17/19 0424 11/18/19 0328 11/19/19 0255  AST 10* 11* 12*  ALT 6 6 8   ALKPHOS 79 86 81  BILITOT 1.0 0.7 1.0  PROT 6.2* 5.9* 6.0*  ALBUMIN 3.2* 3.1* 2.9*   Recent Labs    11/12/19 0519 11/12/19 0519 11/13/19 0547 11/13/19 0547 11/14/19 0259 11/15/19 0206 11/18/19 0328 11/19/19 0255 11/20/19 0459  WBC 11.1*   < > 8.0   < > 5.0   < > 6.1 7.3 6.1  NEUTROABS 10.1*  --  7.1  --  4.3  --   --   --   --   HGB 10.2*   < > 9.5*   < > 8.9*   < > 8.2* 8.2* 8.5*  HCT 33.6*   < > 31.4*   < > 29.5*   < > 27.9* 27.3* 28.2*  MCV 96.6   < > 96.0   < > 95.8   < > 98.6 97.8 97.6  PLT 147*   < > 129*   < > 101*   < > 156 165 190   < > = values in this interval not displayed.   Lab Results  Component Value Date   TSH 4.135 11/13/2019   Lab Results  Component Value Date   HGBA1C 5.7 (H) 09/15/2019   Lab Results  Component Value Date   CHOL 153 08/10/2015   HDL 61 08/10/2015   LDLCALC 76 08/10/2015   TRIG 82 08/10/2015   CHOLHDL 2.5 08/10/2015    Significant Diagnostic Results in last 30 days:  CT ABDOMEN PELVIS WO CONTRAST  Result Date: 11/07/2019 CLINICAL DATA:  Generalized weakness and lethargy. Sepsis. Ascites. Cough. COVID positive yesterday. EXAM: CT CHEST, ABDOMEN AND PELVIS WITHOUT CONTRAST TECHNIQUE: Multidetector CT imaging of the chest, abdomen and pelvis was performed following the standard protocol without IV contrast. COMPARISON:  Radiograph yesterday. Abdominopelvic CT 09/15/2019  FINDINGS: CT CHEST FINDINGS Cardiovascular: Right upper extremity PICC with tip in the SVC. Motion artifact partially obscures detailed evaluation. Normal caliber thoracic aorta. Mild atherosclerosis. Mild cardiomegaly. No pericardial effusion. Mediastinum/Nodes: Motion and lack of contrast limits assessment for adenopathy. Small mediastinal lymph nodes not definitively enlarged. Assessment for hilar adenopathy is significantly limited. Esophagus is patulous. No suspicious thyroid nodule. Lungs/Pleura: Moderate right and small left pleural effusion. Adjacent compressive atelectasis. There are multifocal geographic patchy ground-glass opacities throughout both lungs. Trachea and mainstem bronchi are grossly patent, partially motion obscured.  Musculoskeletal: Generalized subcutaneous edema throughout the chest wall. Diffuse degenerative change throughout the thoracic spine. There are no acute or suspicious osseous abnormalities. CT ABDOMEN PELVIS FINDINGS Hepatobiliary: No evidence of focal hepatic abnormality on noncontrast exam. Liver is prominent size spanning 20 cm cranial caudal. Question of nodular hepatic contours. Hyperdense gallbladder contents. No calcified gallstone. Common bile duct is not well-defined. Pancreas: No pancreatic ductal dilatation. No disproportionate peripancreatic edema. Spleen: Normal in size without focal abnormality. Adrenals/Urinary Tract: Adrenal thickening without dominant nodule. Chronic left renal atrophy. Left ureter is nondilated, unchanged 7 mm calcification in the left pelvis equivocally within the ureter. Right kidney appears normal in size. No hydronephrosis or renal calculi. Urinary bladder is distended and unremarkable. Stomach/Bowel: Bowel evaluation is limited in the absence of contrast, presence of ascites, and motion artifact. Nondistended stomach, grossly unremarkable. No small bowel obstruction. Appendix not visualized. Moderate volume of stool throughout the colon.  There is mild colonic redundancy. No obvious colonic wall thickening or pericolonic inflammation. Vascular/Lymphatic: Abdominal aorta is normal in caliber. No bulky abdominopelvic adenopathy. Reproductive: Uterus is slightly bulky. Limited assessment for adnexal mass given pelvic ascites. Other: Small to moderate volume abdominopelvic ascites. There is generalized edema of the intra-abdominal fat. Prominent generalized edema of the subcutaneous fat, confluent in the flanks. No free intra-abdominal air. Musculoskeletal: Degenerative change in the lumbar spine. There are no acute or suspicious osseous abnormalities. IMPRESSION: 1. Multifocal geographic patchy ground-glass opacities throughout both lungs, consistent with COVID-19 pneumonia. 2. Fluid overload. Moderate right and small left pleural effusion. Small to moderate volume abdominopelvic ascites. Generalized subcutaneous edema of the chest and abdomen consistent with third-spacing, confluent edema in the flanks. 3. Question of nodular hepatic contours, recommend correlation for cirrhosis. 4. Chronic left renal atrophy. Unchanged 7 mm calcification in the left pelvis equivocally within the ureter. No hydronephrosis. Aortic Atherosclerosis (ICD10-I70.0). Electronically Signed   By: Keith Rake M.D.   On: 11/07/2019 23:00   CT CHEST WO CONTRAST  Result Date: 11/07/2019 CLINICAL DATA:  Generalized weakness and lethargy. Sepsis. Ascites. Cough. COVID positive yesterday. EXAM: CT CHEST, ABDOMEN AND PELVIS WITHOUT CONTRAST TECHNIQUE: Multidetector CT imaging of the chest, abdomen and pelvis was performed following the standard protocol without IV contrast. COMPARISON:  Radiograph yesterday. Abdominopelvic CT 09/15/2019 FINDINGS: CT CHEST FINDINGS Cardiovascular: Right upper extremity PICC with tip in the SVC. Motion artifact partially obscures detailed evaluation. Normal caliber thoracic aorta. Mild atherosclerosis. Mild cardiomegaly. No pericardial effusion.  Mediastinum/Nodes: Motion and lack of contrast limits assessment for adenopathy. Small mediastinal lymph nodes not definitively enlarged. Assessment for hilar adenopathy is significantly limited. Esophagus is patulous. No suspicious thyroid nodule. Lungs/Pleura: Moderate right and small left pleural effusion. Adjacent compressive atelectasis. There are multifocal geographic patchy ground-glass opacities throughout both lungs. Trachea and mainstem bronchi are grossly patent, partially motion obscured. Musculoskeletal: Generalized subcutaneous edema throughout the chest wall. Diffuse degenerative change throughout the thoracic spine. There are no acute or suspicious osseous abnormalities. CT ABDOMEN PELVIS FINDINGS Hepatobiliary: No evidence of focal hepatic abnormality on noncontrast exam. Liver is prominent size spanning 20 cm cranial caudal. Question of nodular hepatic contours. Hyperdense gallbladder contents. No calcified gallstone. Common bile duct is not well-defined. Pancreas: No pancreatic ductal dilatation. No disproportionate peripancreatic edema. Spleen: Normal in size without focal abnormality. Adrenals/Urinary Tract: Adrenal thickening without dominant nodule. Chronic left renal atrophy. Left ureter is nondilated, unchanged 7 mm calcification in the left pelvis equivocally within the ureter. Right kidney appears normal in size. No hydronephrosis or renal calculi.  Urinary bladder is distended and unremarkable. Stomach/Bowel: Bowel evaluation is limited in the absence of contrast, presence of ascites, and motion artifact. Nondistended stomach, grossly unremarkable. No small bowel obstruction. Appendix not visualized. Moderate volume of stool throughout the colon. There is mild colonic redundancy. No obvious colonic wall thickening or pericolonic inflammation. Vascular/Lymphatic: Abdominal aorta is normal in caliber. No bulky abdominopelvic adenopathy. Reproductive: Uterus is slightly bulky. Limited  assessment for adnexal mass given pelvic ascites. Other: Small to moderate volume abdominopelvic ascites. There is generalized edema of the intra-abdominal fat. Prominent generalized edema of the subcutaneous fat, confluent in the flanks. No free intra-abdominal air. Musculoskeletal: Degenerative change in the lumbar spine. There are no acute or suspicious osseous abnormalities. IMPRESSION: 1. Multifocal geographic patchy ground-glass opacities throughout both lungs, consistent with COVID-19 pneumonia. 2. Fluid overload. Moderate right and small left pleural effusion. Small to moderate volume abdominopelvic ascites. Generalized subcutaneous edema of the chest and abdomen consistent with third-spacing, confluent edema in the flanks. 3. Question of nodular hepatic contours, recommend correlation for cirrhosis. 4. Chronic left renal atrophy. Unchanged 7 mm calcification in the left pelvis equivocally within the ureter. No hydronephrosis. Aortic Atherosclerosis (ICD10-I70.0). Electronically Signed   By: Keith Rake M.D.   On: 11/07/2019 23:00   DG CHEST PORT 1 VIEW  Result Date: 11/16/2019 CLINICAL DATA:  Shortness of breath.  COVID-19 positive EXAM: PORTABLE CHEST 1 VIEW COMPARISON:  Nov 09, 2019 FINDINGS: Central catheter tip is in the superior vena cava. No pneumothorax. Airspace opacity is noted bilaterally, more extensive on the right than on the left. There has been slight interval clearing on the left without appreciable change on the right compared to most recent study. This opacity is primarily in a perihilar distribution bilaterally. There is a small layering right pleural effusion. There is cardiomegaly with pulmonary venous hypertension. No adenopathy. No bone lesions. IMPRESSION: Cardiomegaly with pulmonary vascular congestion. Airspace opacity is noted bilaterally, more on the right than on the left with partial clearing from the left upper lobe and left base compared to previous study. No change on  the right. Small right pleural effusion. Suspect multifocal pneumonia, although there may well be superimposed pulmonary edema. Question both pneumonia and congestive heart failure concurrent. Electronically Signed   By: Lowella Grip III M.D.   On: 11/16/2019 09:49   DG Chest Port 1 View  Result Date: 11/09/2019 CLINICAL DATA:  Weakness, COVID-19 positive EXAM: PORTABLE CHEST 1 VIEW COMPARISON:  Chest radiograph from one day prior. FINDINGS: Right PICC terminates in the lower third of the SVC. Stable cardiomediastinal silhouette with mild cardiomegaly. No pneumothorax. No pleural effusion. Extensive patchy opacities throughout both lungs, slightly worsened in the upper lungs. IMPRESSION: Extensive patchy opacities throughout both lungs, slightly worsened in the upper lungs, compatible with COVID-19 pneumonia, although with component of pulmonary edema not excluded. Mild cardiomegaly. Electronically Signed   By: Ilona Sorrel M.D.   On: 11/09/2019 08:19   DG Chest Port 1 View  Result Date: 11/08/2019 CLINICAL DATA:  Shortness of breath, COVID-19, diabetes mellitus, hypertension, stage III chronic kidney disease, history endometrial cancer EXAM: PORTABLE CHEST 1 VIEW COMPARISON:  Portable exam 0816 hours compared to 11/06/2019 FINDINGS: RIGHT arm PICC line tip projects over SVC. Enlargement of cardiac silhouette with stable mediastinal contours. Extensive BILATERAL pulmonary infiltrates greatest perihilar on RIGHT, question asymmetric pulmonary edema versus multifocal infection. No pleural effusion or pneumothorax. Bones demineralized with scattered degenerative changes of the thoracic spine. IMPRESSION: Enlargement of cardiac silhouette with increased  BILATERAL pulmonary infiltrates greatest RIGHT perihilar question asymmetric pulmonary edema versus multifocal pneumonia. Electronically Signed   By: Lavonia Dana M.D.   On: 11/08/2019 08:51   DG CHEST PORT 1 VIEW  Result Date: 11/06/2019 CLINICAL DATA:   Weakness. EXAM: PORTABLE CHEST 1 VIEW COMPARISON:  November 05, 2019. FINDINGS: Stable cardiomegaly with possible central pulmonary vascular congestion. No pneumothorax is noted. Mild bibasilar atelectasis is noted with small pleural effusions. Bony thorax is unremarkable. IMPRESSION: Stable cardiomegaly with possible central pulmonary vascular congestion. Mild bibasilar subsegmental atelectasis is noted with small pleural effusions. Electronically Signed   By: Marijo Conception M.D.   On: 11/06/2019 15:57   DG Chest Port 1 View  Result Date: 11/05/2019 CLINICAL DATA:  Weakness. Found on floor today. EXAM: PORTABLE CHEST 1 VIEW COMPARISON:  10/18/2019 FINDINGS: Stable heart size and mediastinal contours with mild cardiomegaly. Hazy opacity at both lung bases likely combination of pleural fluid and airspace disease/atelectasis. Vascular congestion. No pneumothorax. Left proximal humerus fracture appears subacute with surrounding heterotopic ossification, also seen on 09/17/2019 shoulder radiograph. IMPRESSION: 1. Hazy opacity at both lung bases likely combination of pleural fluid and airspace disease/atelectasis. This is stable on the right but progressive on the left from prior. 2. Stable mild cardiomegaly.  Vascular congestion. Electronically Signed   By: Keith Rake M.D.   On: 11/05/2019 17:02   VAS [place] LOWER EXTREMITY VENOUS (DVT)  Result Date: 11/07/2019  Lower Venous DVTStudy Indications: Swelling, and Edema.  Limitations: Body habitus and poor ultrasound/tissue interface. Comparison Study: 09/16/19 previous Performing Technologist: Abram Sander RVS  Examination Guidelines: A complete evaluation includes B-mode imaging, spectral Doppler, color Doppler, and power Doppler as needed of all accessible portions of each vessel. Bilateral testing is considered an integral part of a complete examination. Limited examinations for reoccurring indications may be performed as noted. The reflux portion of the exam is  performed with the patient in reverse Trendelenburg.  +---------+---------------+---------+-----------+----------+--------------+  RIGHT     Compressibility Phasicity Spontaneity Properties Thrombus Aging  +---------+---------------+---------+-----------+----------+--------------+  CFV       Full            Yes       Yes                                    +---------+---------------+---------+-----------+----------+--------------+  SFJ       Full                                                             +---------+---------------+---------+-----------+----------+--------------+  FV Prox   Full                                                             +---------+---------------+---------+-----------+----------+--------------+  FV Mid  Not visualized  +---------+---------------+---------+-----------+----------+--------------+  FV Distal                 Yes       Yes                                    +---------+---------------+---------+-----------+----------+--------------+  PFV       Full                                                             +---------+---------------+---------+-----------+----------+--------------+  POP       Full            Yes       Yes                                    +---------+---------------+---------+-----------+----------+--------------+  PTV                                                        Not visualized  +---------+---------------+---------+-----------+----------+--------------+  PERO                                                       Not visualized  +---------+---------------+---------+-----------+----------+--------------+   +---------+---------------+---------+-----------+----------+--------------+  LEFT      Compressibility Phasicity Spontaneity Properties Thrombus Aging  +---------+---------------+---------+-----------+----------+--------------+  CFV       Full            Yes       Yes                                     +---------+---------------+---------+-----------+----------+--------------+  SFJ       Full                                                             +---------+---------------+---------+-----------+----------+--------------+  FV Prox   Full                                                             +---------+---------------+---------+-----------+----------+--------------+  FV Mid                    Yes       Yes                                    +---------+---------------+---------+-----------+----------+--------------+  FV Distal                 Yes       Yes                                    +---------+---------------+---------+-----------+----------+--------------+  PFV       Full                                                             +---------+---------------+---------+-----------+----------+--------------+  POP       Full            Yes       Yes                                    +---------+---------------+---------+-----------+----------+--------------+  PTV       Full                                                             +---------+---------------+---------+-----------+----------+--------------+  PERO                                                       Not visualized  +---------+---------------+---------+-----------+----------+--------------+     Summary: BILATERAL: - No evidence of deep vein thrombosis seen in the lower extremities, bilaterally.   *See table(s) above for measurements and observations. Electronically signed by Curt Jews MD on 11/07/2019 at 4:26:57 PM.    Final    [place] EKG SITE RITE  Result Date: 11/07/2019 If Site Rite image not attached, placement could not be confirmed due to current cardiac rhythm.  US Abdomen Limited RUQ  Result Date: 11/16/2019 CLINICAL DATA:  Cirrhosis EXAM: ULTRASOUND ABDOMEN LIMITED RIGHT UPPER QUADRANT COMPARISON:  11/07/2019 FINDINGS: Gallbladder: The gallbladder wall is thickened measuring approximately 8.6 mm in thickness. The  sonographic Murphy sign cannot be adequately assessed. There is gallbladder sludge. There is some mild pericholecystic free fluid. Common bile duct: Diameter: 6 mm Liver: The liver surface is nodular. There is no discrete hepatic mass. Portal vein is patent on color Doppler imaging with normal direction of blood flow towards the liver. Other: There is a small volume of ascites in the upper abdomen. There is right-sided pelviectasis. The right kidney is echogenic. IMPRESSION: 1. There is gallbladder sludge with associated gallbladder wall thickening. The sonographic Murphy sign cannot be adequately assessed. If there is high clinical suspicion for acute cholecystitis, follow-up with HIDA scan is recommended. 2. Nodular appearance of the liver which can be seen in patients with cirrhosis. There is no discrete hepatic mass. 3. Echogenic right kidney suggestive of underlying medical renal disease. 4. Right-sided pelviectasis of unknown clinical significance. This appears to be new since the patient's prior CT dated 11/07/2019 5. Small volume abdominal ascites. Electronically Signed  By: Constance Holster M.D.   On: 11/16/2019 17:14    Assessment/Plan  1. Pneumonia due to COVID-19 virus -  was treated with vancomycin and Zosyn then transitioned to doxycycline, completed treatment  2. Generalized weakness -For PT and OT, for therapeutic strengthening exercises  3. Hypothermia, sequela Lab Results  Component Value Date   TSH 4.135 11/13/2019   .  For endocrinology consult with Dr. Renato Shin  4. Endometrial cancer [facility]) -Follow-up with GYN oncology, Dr. Jeral Pinch  5. Trichimoniasis - completed Flagyl X 7 days in the hospital  6. Duodenal ulcer -Continue pantoprazole  7. Essential hypertension -Continue carvedilol 12.5 mg twice a day, hydralazine and clonidine 0.2 mg 1 tab twice a day  8. Type 2 diabetes mellitus with stage 3b chronic kidney disease, without long-term current use of  insulin (HCC) Lab Results  Component Value Date   HGBA1C 5.7 (H) 09/15/2019   -  Diet-controlled, monitor CBGs  9. Anemia due to chronic blood loss Lab Results  Component Value Date   HGB 8.5 (L) 11/20/2019   -Continue ferrous sulfate 325 mg 1 tab twice a day  10. Lower extremity edema -Continue Lasix 40 mg 1 tab daily  11. Morbidly obese ([place]) Wt Readings from Last 3 Encounters:  11/22/19 201 lb 3.2 oz (91.3 kg)  11/22/19 211 lb 10.3 oz (96 kg)  10/18/19 221 lb 1.9 oz (100.3 kg)   - discussed dietary choices  12. Tooth Decay - dental consult for multiple decayed teeth    Family/ staff Communication: Discussed plan of care with resident and charge nurse.  Labs/tests ordered: CBC and CMP in 1 week  Goals of care:   Short-term care   Durenda Age, DNP, FNP-BC [facility] and Adult Medicine [phone] (Monday-Friday 8:00 a.m. - 5:00 p.m.) [phone] (after hours)

## 2019-11-22 NOTE — TOC Progression Note (Signed)
Transition of Care [facility]) - Progression Note    Patient Details  Name: Vanessa [name] MRN: [id number] Date of Birth: [birth year]  Transition [facility]) CM/SW [place], LCSW Phone Number: 11/22/2019, 8:28 AM  Clinical Narrative:    Insurance approval received for [place]: [id number], Ref# [id number] through 11/28/19. CSW will arrange PTAR.    Expected Discharge Plan: Skilled Nursing Facility Barriers to Discharge: Barriers Resolved  Expected Discharge Plan and Services Expected Discharge Plan: [place] In-house Referral: Clinical Social Work Discharge Planning Services: CM Consult Post Acute Care Choice: [place] Living arrangements for the past 2 months: [place] Expected Discharge Date: 11/21/19                                     Social Determinants of Health (SDOH) Interventions    Readmission Risk Interventions Readmission Risk Prevention Plan 11/08/2019 10/21/2019 09/25/2019  Transportation Screening Complete Complete Complete  PCP or Specialist Appt within 3-5 Days - - Not Complete  Not Complete comments - - plan for SNF  [place]RI or [place] - - Complete  Social Work Consult for [place] Planning/Counseling - - Complete  Palliative Care Screening - - Not Applicable  Medication Review Press photographer) Complete Referral to Pharmacy Referral to Pharmacy  PCP or Specialist appointment within 3-5 days of discharge Complete Complete -  [place] or Home Care Consult Complete (No Data) -  SW Recovery Care/Counseling Consult Complete Complete -  Palliative Care Screening Not Applicable Not Applicable -  Skilled Nursing Facility Complete Complete -  Some recent data might be hidden

## 2019-11-22 NOTE — TOC Transition Note (Addendum)
Transition of Care [facility]) - CM/SW Discharge Note   Patient Details  Name: Vanessa [name] MRN: [id number] Date of Birth: January 22, 1956  Transition of Care [facility]) CM/SW Contact:  Benard Halsted, LCSW Phone Number: 11/22/2019, 8:33 AM   Clinical Narrative:    Patient will DC to: [facility] Anticipated DC date: 11/22/19 Family notified: Patient notified spouse Transport by: Corey Harold   Per MD patient ready for DC to [facility]. RN, patient, patient's family, and facility notified of DC. Discharge Summary and FL2 sent to facility. RN to call report prior to discharge ([phone] or [phone] Room [phone]). DC packet on chart. Ambulance transport requested for patient.   CSW will sign off for now as social work intervention is no longer needed. Please consult [place] again if new needs arise.      Final next level of care: Skilled Nursing Facility Barriers to Discharge: Barriers Resolved   Patient Goals and CMS Choice Patient states their goals for this hospitalization and ongoing recovery are:: Going back to rehab CMS Medicare.gov Compare Post Acute Care list provided to:: Patient Choice offered to / list presented to : Patient  Discharge Placement   Existing PASRR number confirmed : 11/22/19          Patient [facility] bed at: [facility] Patient to be transferred to facility by: [place] Name of family member notified: Patient notified spouse Patient and family notified of of transfer: 11/22/19  Discharge Plan and Services In-house Referral: Clinical Social Work Discharge Planning Services: CM Consult Post Acute Care Choice: [place]                               Social Determinants of Health (SDOH) Interventions     Readmission Risk Interventions Readmission Risk Prevention Plan 11/08/2019 10/21/2019 09/25/2019  Transportation Screening Complete Complete Complete  PCP or Specialist Appt within 3-5 Days - - Not Complete  Not Complete comments - - plan for  SNF  [place] or [place] - - Complete  Social Work Consult for [place] Planning/Counseling - - Complete  Palliative Care Screening - - Not Applicable  Medication Review Press photographer) Complete Referral to Pharmacy Referral to Pharmacy  PCP or Specialist appointment within 3-5 days of discharge Complete Complete -  [place] or Home Care Consult Complete (No Data) -  SW Recovery Care/Counseling Consult Complete Complete -  Palliative Care Screening Not Applicable Not Applicable -  Skilled Nursing Facility Complete Complete -  Some recent data might be hidden

## 2019-11-25 ENCOUNTER — Encounter: Payer: Self-pay | Admitting: Internal Medicine

## 2019-11-25 ENCOUNTER — Telehealth: Payer: Self-pay

## 2019-11-25 NOTE — Telephone Encounter (Signed)
Gave [name] the appointment for [name] to be here at 1100 to register at the [facility] and will see Dr. Berline [name] at [facility] for IUD placement. Vanessa [name] verbalized understanding.

## 2019-11-25 NOTE — Progress Notes (Signed)
NURSING HOME LOCATION:  [facility] ROOM NUMBER:  [id number]  CODE STATUS:  FULL CODE  PCP:  Maren Reamer, MD (Inactive)  No address on file   This is a comprehensive admission note to [place] performed on this date less than 30 days from date of admission. Included are preadmission medical/surgical history; reconciled medication list; family history; social history and comprehensive review of systems.  Corrections and additions to the records were documented. Comprehensive physical exam was also performed. Additionally a clinical summary was entered for each active diagnosis pertinent to this admission in the Problem List to enhance continuity of care.  HPI: Patient was hospitalized 4/27-5/14/2021 presenting from home with weakness and fever.  Sepsis was attributed to COVID-19 pneumonia.  4/29 CTs chest scan revealed patchy groundglass opacities, volume overload, with moderate right and small left pleural effusions.  CT of the abdomen/pelvis revealed small volume ascites with nodular hepatic contour compatible with cirrhosis.  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   She received steroids 4/29-5/2 and remdesivir 4/29-5/3.  Broad-spectrum antibiotic coverage included vancomycin 4/29-5/1, Zosyn 4/29-5/2, doxycycline 5/3-5/5, and Flagyl 5/7-5/13.   Apparently the patient was diagnosed with trichomoniasis prior to the most recent hospitalization. Flagyl had been prescribed. She had been hospitalized 4/9-4/12 for angioedema. Initially it was thought Flagyl had caused the angioedema;but she had not taken any Flagyl according to the SNF where she was receiving rehab prior to that hospitalization. As noted she subsequently completed a course of Flagyl during the most recent hospitalization without exacerbation of angioedema.The time of that admission she was not  on an ACE inhibitor or an ARB.  She was on topical Orajel which does contain benzocaine. Blood cultures were negative.  Hospital course was complicated by persistent hypothermia.  Temperature fluctuated from 92 degrees to 96 F.  This was asymptomatic clinically.  TSH was normal. Because of persistent lower extremity edema Doppler was performed which was negative for DVT. Above is in the context of recent admission for MSSA bacteremia in March.  Additionally she had recent GI bleed due to duodenal ulcer.  There was no recurrence during the most recent hospitalization.  She had had the first Covid vaccine in early March but was in the facility and could not get the second dose. Outpatient endocrine evaluation of hypothermia of unknown etiology was recommended.  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  Past medical and surgical history: Includes morbid obesity, history of MSSA bacteremia, essential hypertension, dyslipidemia, esophageal dysmotility syndrome, history of endometrial cancer, diabetes with CKD stage IIIb, history of anemia of chronic disease, and history of asthma.  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 Berline Lopes.  Apparently she is considered a poor surgical candidate at this time. Procedures including EGD in March of this year for bleeding related to duodenal ulcer.  Social history: History of social alcohol, former smoker.  Family history: Extensive history reviewed.  Review of systems: It is difficult to get a focused history or review of systems with this patient.  When I asked why she had been hospitalized she stated that her temperature was "very, very low".  Then she also mentioned "possibility of cancer. uterus". At no time did she admit to having Covid.  She denies any constitutional symptoms and states that she is "doing okay".  She states that she coughs but does not produce sputum,  rather swallowing it.   She states that her husband says she snores; she is unaware of any apnea. She has "gas".  She describes being "sad" when asked about anxiety or depression.   Constitutional: No fever, significant weight change, fatigue  Eyes: No redness, discharge, pain, vision change ENT/mouth: No nasal congestion, purulent discharge, earache, change in hearing, sore throat  Cardiovascular: No chest pain, palpitations, paroxysmal nocturnal dyspnea  Respiratory: No hemoptysis Gastrointestinal: No heartburn, dysphagia, abdominal pain, nausea /vomiting, rectal bleeding, melena, change in bowels Genitourinary: No dysuria, hematuria, pyuria, incontinence, nocturia Musculoskeletal: No joint stiffness, joint swelling, weakness, pain Dermatologic: No rash, pruritus, change in appearance of skin Neurologic: No dizziness, headache, syncope, seizures Endocrine: No change in hair/skin/nails, excessive thirst, excessive hunger, excessive urination  Hematologic/lymphatic: No significant bruising, lymphadenopathy, abnormal bleeding Allergy/immunology: No itchy/watery eyes, significant sneezing, urticaria, active angioedema  Physical exam:  Pertinent or positive findings: She appears chronically ill. She is obese.  Left nasolabial fold is asymmetrically decreased compared to the right.  Speech is slurred.  Dental hygiene is extremely poor with dark gray/black plaque diffusely over the teeth.  First heart sound is accentuated.  Breath sounds are decreased with some bronchovesicular quality.  Abdomen is markedly protuberant.  She has 1+ edema bilaterally.  Pedal pulses are decreased.  She is diffusely weak to opposition; this is greater on the left than the right clinically.  There is decreased elevation of the left upper extremity suggesting shoulder impingement syndrome.  She has scarring over the anterior shins with some hypopigmented lesions.  She has circular hyper pigmented scars over the lower extremities as well.  Slight  clubbing is suggested.  General appearance:  no acute distress, increased work of breathing is present.   Lymphatic: No lymphadenopathy about the head, neck, axilla. Eyes: No conjunctival inflammation or lid edema is present. There is no scleral icterus. Ears:  External ear exam shows no significant lesions or deformities.   Nose:  External nasal examination shows no deformity or inflammation. Nasal mucosa are pink and moist without lesions, exudates Neck:  No thyromegaly, masses, tenderness noted.    Heart:  Normal rate and regular rhythm. S2 normal without gallop, murmur, click, rub.  Lungs:  without wheezes, rhonchi, rales, rubs. Abdomen: Bowel sounds are normal.  Abdomen is soft and nontender with no organomegaly, hernias, masses. GU: Deferred  Extremities:  No cyanosis Neurologic exam:  Balance, Rhomberg, finger to nose testing could not be completed due to clinical state Skin: Warm & dry w/o tenting.   See clinical summary under each active problem in the Problem List with associated updated therapeutic plan

## 2019-11-26 ENCOUNTER — Non-Acute Institutional Stay (SKILLED_NURSING_FACILITY): Payer: Medicare Other | Admitting: Internal Medicine

## 2019-11-26 ENCOUNTER — Encounter: Payer: Self-pay | Admitting: Internal Medicine

## 2019-11-26 DIAGNOSIS — A599 Trichomoniasis, unspecified: Secondary | ICD-10-CM

## 2019-11-26 DIAGNOSIS — E1121 Type 2 diabetes mellitus with diabetic nephropathy: Secondary | ICD-10-CM

## 2019-11-26 DIAGNOSIS — E1122 Type 2 diabetes mellitus with diabetic chronic kidney disease: Secondary | ICD-10-CM

## 2019-11-26 DIAGNOSIS — N1832 Chronic kidney disease, stage 3b: Secondary | ICD-10-CM

## 2019-11-26 DIAGNOSIS — U071 COVID-19: Secondary | ICD-10-CM | POA: Diagnosis not present

## 2019-11-26 DIAGNOSIS — T783XXD Angioneurotic edema, subsequent encounter: Secondary | ICD-10-CM

## 2019-11-26 DIAGNOSIS — T68XXXD Hypothermia, subsequent encounter: Secondary | ICD-10-CM | POA: Diagnosis not present

## 2019-11-26 DIAGNOSIS — T68XXXA Hypothermia, initial encounter: Secondary | ICD-10-CM | POA: Insufficient documentation

## 2019-11-26 NOTE — Assessment & Plan Note (Signed)
Patient is not a candidate for ACE inhibitor or ARB in the future because of the extremely high risk of recurrent angioedema.

## 2019-11-26 NOTE — Assessment & Plan Note (Signed)
Course Flagyl completed during the 4/27-5/14/2021 admission for Covid 19 related sepsis

## 2019-11-26 NOTE — Assessment & Plan Note (Signed)
A1c pre or borderline diabetes at 5.7%.

## 2019-11-26 NOTE — Patient Instructions (Signed)
See assessment and plan under each diagnosis in the problem list and acutely for this visit 

## 2019-11-26 NOTE — Assessment & Plan Note (Signed)
Temp today 97.4

## 2019-11-27 ENCOUNTER — Encounter: Payer: Self-pay | Admitting: Adult Health

## 2019-11-27 ENCOUNTER — Non-Acute Institutional Stay (SKILLED_NURSING_FACILITY): Payer: Medicare Other | Admitting: Adult Health

## 2019-11-27 DIAGNOSIS — I952 Hypotension due to drugs: Secondary | ICD-10-CM | POA: Diagnosis not present

## 2019-11-27 DIAGNOSIS — Z23 Encounter for immunization: Secondary | ICD-10-CM

## 2019-11-27 NOTE — Progress Notes (Signed)
Location:  [place] Room Number: 318-A Place of Service:  SNF (31) Provider:  Durenda Age, DNP, FNP-BC  Patient Care Team: Maren Reamer, MD (Inactive) as PCP - General (Internal Medicine)  Extended Emergency Contact Information Primary Emergency Contact: Jennette Dubin of [place] Phone: [phone] Mobile Phone: [phone] Relation: Spouse  Code Status:  FULL COVER  Goals of care: Advanced Directive information Advanced Directives 11/06/2019  Does Patient Have a Medical Advance Directive? No  Would patient like information on creating a medical advance directive? No - Patient declined     Chief Complaint  Patient presents with  . Acute Visit    Patient is seen for low blood pressure.     HPI:  Pt is a 64 y.o. female seen today for low blood pressure. BPs 120/75, 93/55, 108/67, 108/67, 106/60, 139/63, 134/77. She takes Furosemide for lower extremity edema, and Amlodipine, Clonidine and Carvedilol for hypertension. She denies being dizzy. She has requested for tetanus and shingles vaccine. 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 She has a PMH of hypertension.   Past Medical History:  Diagnosis Date  . Anemia 10/09/2019  . Asthma   . CKD (chronic kidney disease) stage 3, GFR 30-59 ml/min   . Dental disease   . Diabetes mellitus without complication ([place])   . Endometrial cancer ([place])   . Esophageal dysmotility   . Heart murmur   . HLD (hyperlipidemia)   . Hypertension   . MSSA bacteremia 09/2019  . Obesity, Class III, BMI 40-49.9 (morbid obesity) ([place])   . Pressure ulcer    Past Surgical History:  Procedure Laterality Date  . BIOPSY  10/08/2019   Procedure: BIOPSY;  Surgeon: Clarene Essex, MD;  Location: [facility] ENDOSCOPY;  Service: Endoscopy;;  . ESOPHAGOGASTRODUODENOSCOPY N/A 10/08/2019   Procedure: ESOPHAGOGASTRODUODENOSCOPY (EGD);  Surgeon: Clarene Essex, MD;   Location: [place];  Service: Endoscopy;  Laterality: N/A;  . TYMPANOSTOMY TUBE PLACEMENT Bilateral     Allergies  Allergen Reactions  . Ace Inhibitors     Never prescribed but she has had angioedema  . Angiotensin Receptor Blockers     ARB never Rxed but PMH  of angioedema in March 2021    Outpatient Encounter Medications as of 11/27/2019  Medication Sig  . acetaminophen (TYLENOL) 500 MG tablet Take 500 mg by mouth every 6 (six) hours as needed for moderate pain.  . Amino Acids-Protein Hydrolys (FEEDING SUPPLEMENT, PRO-STAT SUGAR FREE 64,) LIQD Take 30 mLs by mouth 3 (three) times daily with meals.  Marland Kitchen amLODipine (NORVASC) 10 MG tablet Take 1 tablet (10 mg total) by mouth daily.  . Blood Glucose Monitoring Suppl (BAYER CONTOUR MONITOR) w/Device KIT Use as directed for 3 times daily testing of blood glucose. E11.9 (Patient not taking: Reported on 11/22/2019)  . carvedilol (COREG) 12.5 MG tablet Take 1 tablet (12.5 mg total) by mouth 2 (two) times daily with a meal.  . Chlorhexidine Gluconate Cloth 2 % PADS Apply 6 each topically daily.  . cloNIDine (CATAPRES) 0.2 MG tablet Take 1 tablet (0.2 mg total) by mouth 2 (two) times daily.  . feeding supplement, ENSURE ENLIVE, (ENSURE ENLIVE) LIQD Take 237 mLs by mouth 3 (three) times daily between meals.  . ferrous sulfate 325 (65 FE) MG tablet Take 325 mg by mouth 2 (two) times daily with a meal.  . furosemide (LASIX) 40 MG tablet Take 40 mg by mouth daily.  Marland Kitchen glucose blood (BAYER CONTOUR  TEST) test strip Use as instructed for 3 times daily testing of blood glucose. E11.9 (Patient not taking: Reported on 11/22/2019)  . hydrALAZINE (APRESOLINE) 50 MG tablet Take 2 tablets (100 mg total) by mouth every 8 (eight) hours.  . INSULIN SYRINGE 1CC/29G 29G X 1/2" 1 ML MISC Insulin syringes (Patient not taking: Reported on 11/22/2019)  . Lancets (ONETOUCH ULTRASOFT) lancets Use as instructed (Patient not taking: Reported on 11/22/2019)  . loratadine  (CLARITIN) 10 MG tablet Take 10 mg by mouth daily as needed for allergies.  . pantoprazole sodium (PROTONIX) 40 mg/20 mL PACK Take 20 mLs (40 mg total) by mouth daily.  . polyethylene glycol (MIRALAX / GLYCOLAX) 17 g packet Take 17 g by mouth daily.  . potassium chloride (KLOR-CON) 10 MEQ tablet Take 2 tablets (20 mEq total) by mouth daily.   No facility-administered encounter medications on file as of 11/27/2019.    Review of Systems  GENERAL: No change in appetite, no fatigue, no weight changes, no fever, chills or weakness MOUTH and THROAT: Denies oral discomfort, gingival pain or bleeding RESPIRATORY: no cough, SOB, DOE, wheezing, hemoptysis CARDIAC: No chest pain or palpitations GI: No abdominal pain, diarrhea, constipation, heart burn, nausea or vomiting GU: Denies dysuria, frequency, hematuria, incontinence, or discharge NEUROLOGICAL: Denies dizziness, syncope, numbness, or headache PSYCHIATRIC: Denies feelings of depression or anxiety. No report of hallucinations, insomnia, paranoia, or agitation   Immunization History  Administered Date(s) Administered  . Influenza,inj,Quad PF,6+ Mos 08/03/2015, 03/24/2016  . PFIZER SARS-COV-2 Vaccination 09/12/2019  . Pneumococcal Polysaccharide-23 03/24/2016  . Tdap 08/15/2016   Pertinent  Health Maintenance Due  Topic Date Due  . MAMMOGRAM  Never done  . COLONOSCOPY  Never done  . OPHTHALMOLOGY EXAM  09/08/2017  . FOOT EXAM  11/29/2017  . DEXA SCAN  Never done  . PNA vac Low Risk Adult (1 of 2 - PCV13) 08/23/2019  . INFLUENZA VACCINE  02/09/2020  . HEMOGLOBIN A1C  03/17/2020  . PAP SMEAR-Modifier  09/19/2022   Fall Risk  12/21/2015 04/06/2014 03/10/2014  Falls in the past year? No No No     Vitals:   11/27/19 1223  BP: 120/75  Pulse: (!) 58  Resp: 20  Temp: (!) 96.4 F (35.8 C)  TempSrc: Oral  SpO2: 94%  Weight: 201 lb 3.2 oz (91.3 kg)  Height: 5' (1.524 m)   Body mass index is 39.29 kg/m.  Physical Exam  GENERAL  APPEARANCE: Well nourished. In no acute distress. Morbidly obese SKIN:  Skin is warm and dry.  MOUTH and THROAT: Lips are without lesions. Oral mucosa is moist and without lesions. Tongue is normal in shape, size, and color and without lesions RESPIRATORY: Breathing is even & unlabored, BS CTAB CARDIAC: RRR, no murmur,no extra heart sounds, BLE 2+ edema GI: Abdomen soft, normal BS, no masses, no tenderness NEUROLOGICAL: There is no tremor. Speech is clear. Alert and oriented X 3. PSYCHIATRIC:  Affect and behavior are appropriate  Labs reviewed: Recent Labs    09/15/19 0616 09/15/19 0616 09/16/19 0139 09/17/19 0317 11/07/19 0441 11/08/19 0437 11/12/19 0519 11/12/19 0519 11/13/19 0547 11/13/19 0547 11/14/19 0259 11/15/19 0206 11/18/19 0328 11/19/19 0255 11/20/19 0459  NA 140   < > 142   < > 149*   < > 146*   < > 146*   < > 147*   < > 152* 149* 143  K 2.7*   < > 3.5   < > 3.2*   < > 3.1*   < >  3.0*   < > 3.7   < > 3.5 3.6 3.9  CL 109   < > 113*   < > 112*   < > 108   < > 110   < > 113*   < > 114* 112* 107  CO2 19*   < > 19*   < > 26   < > 25   < > 25   < > 25   < > _0 GLUCOSE 92   < > 126*   < > 110*   < > 130*   < > 126*   < > 138*   < > 134* 149* 166*  BUN 23   < > 27*   < > 28*   < > 42*   < > 44*   < > 48*   < > 42* 38* 34*  CREATININE 1.78*   < > 2.02*   < > 1.76*   < > 1.79*   < > 1.77*   < > 1.67*   < > 1.70* 1.67* 1.66*  CALCIUM 8.3*   < > 8.3*   < > 8.5*   < > 8.3*   < > 8.3*   < > 8.0*   < > 9.0 8.9 9.0  MG 2.1   < > 2.2  --  1.8   < > 1.7  --  1.8  --  1.8  --   --   --   --   PHOS 3.9  --  3.3  --  4.2  --   --   --   --   --   --   --   --   --   --    < > = values in this interval not displayed.   Recent Labs    11/17/19 0424 11/18/19 0328 11/19/19 0255  AST 10* 11* 12*  ALT _1 ALKPHOS 79 86 81  BILITOT 1.0 0.7 1.0  PROT 6.2* 5.9* 6.0*  ALBUMIN 3.2* 3.1* 2.9*   Recent Labs    11/12/19 0519 11/12/19 0519 11/13/19 0547 11/13/19 0547  11/14/19 0259 11/15/19 0206 11/18/19 0328 11/19/19 0255 11/20/19 0459  WBC 11.1*   < > 8.0   < > 5.0   < > 6.1 7.3 6.1  NEUTROABS 10.1*  --  7.1  --  4.3  --   --   --   --   HGB 10.2*   < > 9.5*   < > 8.9*   < > 8.2* 8.2* 8.5*  HCT 33.6*   < > 31.4*   < > 29.5*   < > 27.9* 27.3* 28.2*  MCV 96.6   < > 96.0   < > 95.8   < > 98.6 97.8 97.6  PLT 147*   < > 129*   < > 101*   < > 156 165 190   < > = values in this interval not displayed.   Lab Results  Component Value Date   TSH 4.135 11/13/2019   Lab Results  Component Value Date   HGBA1C 5.7 (H) 09/15/2019   Lab Results  Component Value Date   CHOL 153 08/10/2015   HDL 61 08/10/2015   LDLCALC 76 08/10/2015   TRIG 82 08/10/2015   CHOLHDL 2.5 08/10/2015    Significant Diagnostic Results in last 30 days:  CT ABDOMEN PELVIS WO CONTRAST  Result Date: 11/07/2019 CLINICAL DATA:  Generalized weakness  and lethargy. Sepsis. Ascites. Cough. COVID positive yesterday. EXAM: CT CHEST, ABDOMEN AND PELVIS WITHOUT CONTRAST TECHNIQUE: Multidetector CT imaging of the chest, abdomen and pelvis was performed following the standard protocol without IV contrast. COMPARISON:  Radiograph yesterday. Abdominopelvic CT 09/15/2019 FINDINGS: CT CHEST FINDINGS Cardiovascular: Right upper extremity PICC with tip in the SVC. Motion artifact partially obscures detailed evaluation. Normal caliber thoracic aorta. Mild atherosclerosis. Mild cardiomegaly. No pericardial effusion. Mediastinum/Nodes: Motion and lack of contrast limits assessment for adenopathy. Small mediastinal lymph nodes not definitively enlarged. Assessment for hilar adenopathy is significantly limited. Esophagus is patulous. No suspicious thyroid nodule. Lungs/Pleura: Moderate right and small left pleural effusion. Adjacent compressive atelectasis. There are multifocal geographic patchy ground-glass opacities throughout both lungs. Trachea and mainstem bronchi are grossly patent, partially motion  obscured. Musculoskeletal: Generalized subcutaneous edema throughout the chest wall. Diffuse degenerative change throughout the thoracic spine. There are no acute or suspicious osseous abnormalities. CT ABDOMEN PELVIS FINDINGS Hepatobiliary: No evidence of focal hepatic abnormality on noncontrast exam. Liver is prominent size spanning 20 cm cranial caudal. Question of nodular hepatic contours. Hyperdense gallbladder contents. No calcified gallstone. Common bile duct is not well-defined. Pancreas: No pancreatic ductal dilatation. No disproportionate peripancreatic edema. Spleen: Normal in size without focal abnormality. Adrenals/Urinary Tract: Adrenal thickening without dominant nodule. Chronic left renal atrophy. Left ureter is nondilated, unchanged 7 mm calcification in the left pelvis equivocally within the ureter. Right kidney appears normal in size. No hydronephrosis or renal calculi. Urinary bladder is distended and unremarkable. Stomach/Bowel: Bowel evaluation is limited in the absence of contrast, presence of ascites, and motion artifact. Nondistended stomach, grossly unremarkable. No small bowel obstruction. Appendix not visualized. Moderate volume of stool throughout the colon. There is mild colonic redundancy. No obvious colonic wall thickening or pericolonic inflammation. Vascular/Lymphatic: Abdominal aorta is normal in caliber. No bulky abdominopelvic adenopathy. Reproductive: Uterus is slightly bulky. Limited assessment for adnexal mass given pelvic ascites. Other: Small to moderate volume abdominopelvic ascites. There is generalized edema of the intra-abdominal fat. Prominent generalized edema of the subcutaneous fat, confluent in the flanks. No free intra-abdominal air. Musculoskeletal: Degenerative change in the lumbar spine. There are no acute or suspicious osseous abnormalities. IMPRESSION: 1. Multifocal geographic patchy ground-glass opacities throughout both lungs, consistent with COVID-19  pneumonia. 2. Fluid overload. Moderate right and small left pleural effusion. Small to moderate volume abdominopelvic ascites. Generalized subcutaneous edema of the chest and abdomen consistent with third-spacing, confluent edema in the flanks. 3. Question of nodular hepatic contours, recommend correlation for cirrhosis. 4. Chronic left renal atrophy. Unchanged 7 mm calcification in the left pelvis equivocally within the ureter. No hydronephrosis. Aortic Atherosclerosis (ICD10-I70.0). Electronically Signed   By: Keith Rake M.D.   On: 11/07/2019 23:00   CT CHEST WO CONTRAST  Result Date: 11/07/2019 CLINICAL DATA:  Generalized weakness and lethargy. Sepsis. Ascites. Cough. COVID positive yesterday. EXAM: CT CHEST, ABDOMEN AND PELVIS WITHOUT CONTRAST TECHNIQUE: Multidetector CT imaging of the chest, abdomen and pelvis was performed following the standard protocol without IV contrast. COMPARISON:  Radiograph yesterday. Abdominopelvic CT 09/15/2019 FINDINGS: CT CHEST FINDINGS Cardiovascular: Right upper extremity PICC with tip in the SVC. Motion artifact partially obscures detailed evaluation. Normal caliber thoracic aorta. Mild atherosclerosis. Mild cardiomegaly. No pericardial effusion. Mediastinum/Nodes: Motion and lack of contrast limits assessment for adenopathy. Small mediastinal lymph nodes not definitively enlarged. Assessment for hilar adenopathy is significantly limited. Esophagus is patulous. No suspicious thyroid nodule. Lungs/Pleura: Moderate right and small left pleural effusion. Adjacent compressive atelectasis. There are  multifocal geographic patchy ground-glass opacities throughout both lungs. Trachea and mainstem bronchi are grossly patent, partially motion obscured. Musculoskeletal: Generalized subcutaneous edema throughout the chest wall. Diffuse degenerative change throughout the thoracic spine. There are no acute or suspicious osseous abnormalities. CT ABDOMEN PELVIS FINDINGS  Hepatobiliary: No evidence of focal hepatic abnormality on noncontrast exam. Liver is prominent size spanning 20 cm cranial caudal. Question of nodular hepatic contours. Hyperdense gallbladder contents. No calcified gallstone. Common bile duct is not well-defined. Pancreas: No pancreatic ductal dilatation. No disproportionate peripancreatic edema. Spleen: Normal in size without focal abnormality. Adrenals/Urinary Tract: Adrenal thickening without dominant nodule. Chronic left renal atrophy. Left ureter is nondilated, unchanged 7 mm calcification in the left pelvis equivocally within the ureter. Right kidney appears normal in size. No hydronephrosis or renal calculi. Urinary bladder is distended and unremarkable. Stomach/Bowel: Bowel evaluation is limited in the absence of contrast, presence of ascites, and motion artifact. Nondistended stomach, grossly unremarkable. No small bowel obstruction. Appendix not visualized. Moderate volume of stool throughout the colon. There is mild colonic redundancy. No obvious colonic wall thickening or pericolonic inflammation. Vascular/Lymphatic: Abdominal aorta is normal in caliber. No bulky abdominopelvic adenopathy. Reproductive: Uterus is slightly bulky. Limited assessment for adnexal mass given pelvic ascites. Other: Small to moderate volume abdominopelvic ascites. There is generalized edema of the intra-abdominal fat. Prominent generalized edema of the subcutaneous fat, confluent in the flanks. No free intra-abdominal air. Musculoskeletal: Degenerative change in the lumbar spine. There are no acute or suspicious osseous abnormalities. IMPRESSION: 1. Multifocal geographic patchy ground-glass opacities throughout both lungs, consistent with COVID-19 pneumonia. 2. Fluid overload. Moderate right and small left pleural effusion. Small to moderate volume abdominopelvic ascites. Generalized subcutaneous edema of the chest and abdomen consistent with third-spacing, confluent edema in  the flanks. 3. Question of nodular hepatic contours, recommend correlation for cirrhosis. 4. Chronic left renal atrophy. Unchanged 7 mm calcification in the left pelvis equivocally within the ureter. No hydronephrosis. Aortic Atherosclerosis (ICD10-I70.0). Electronically Signed   By: Keith Rake M.D.   On: 11/07/2019 23:00   DG CHEST PORT 1 VIEW  Result Date: 11/16/2019 CLINICAL DATA:  Shortness of breath.  COVID-19 positive EXAM: PORTABLE CHEST 1 VIEW COMPARISON:  Nov 09, 2019 FINDINGS: Central catheter tip is in the superior vena cava. No pneumothorax. Airspace opacity is noted bilaterally, more extensive on the right than on the left. There has been slight interval clearing on the left without appreciable change on the right compared to most recent study. This opacity is primarily in a perihilar distribution bilaterally. There is a small layering right pleural effusion. There is cardiomegaly with pulmonary venous hypertension. No adenopathy. No bone lesions. IMPRESSION: Cardiomegaly with pulmonary vascular congestion. Airspace opacity is noted bilaterally, more on the right than on the left with partial clearing from the left upper lobe and left base compared to previous study. No change on the right. Small right pleural effusion. Suspect multifocal pneumonia, although there may well be superimposed pulmonary edema. Question both pneumonia and congestive heart failure concurrent. Electronically Signed   By: Lowella Grip III M.D.   On: 11/16/2019 09:49   DG Chest Port 1 View  Result Date: 11/09/2019 CLINICAL DATA:  Weakness, COVID-19 positive EXAM: PORTABLE CHEST 1 VIEW COMPARISON:  Chest radiograph from one day prior. FINDINGS: Right PICC terminates in the lower third of the SVC. Stable cardiomediastinal silhouette with mild cardiomegaly. No pneumothorax. No pleural effusion. Extensive patchy opacities throughout both lungs, slightly worsened in the upper lungs. IMPRESSION: Extensive patchy  opacities throughout both lungs, slightly worsened in the upper lungs, compatible with COVID-19 pneumonia, although with component of pulmonary edema not excluded. Mild cardiomegaly. Electronically Signed   By: Ilona Sorrel M.D.   On: 11/09/2019 08:19   DG Chest Port 1 View  Result Date: 11/08/2019 CLINICAL DATA:  Shortness of breath, COVID-19, diabetes mellitus, hypertension, stage III chronic kidney disease, history endometrial cancer EXAM: PORTABLE CHEST 1 VIEW COMPARISON:  Portable exam 0816 hours compared to 11/06/2019 FINDINGS: RIGHT arm PICC line tip projects over SVC. Enlargement of cardiac silhouette with stable mediastinal contours. Extensive BILATERAL pulmonary infiltrates greatest perihilar on RIGHT, question asymmetric pulmonary edema versus multifocal infection. No pleural effusion or pneumothorax. Bones demineralized with scattered degenerative changes of the thoracic spine. IMPRESSION: Enlargement of cardiac silhouette with increased BILATERAL pulmonary infiltrates greatest RIGHT perihilar question asymmetric pulmonary edema versus multifocal pneumonia. Electronically Signed   By: Lavonia Dana M.D.   On: 11/08/2019 08:51   DG CHEST PORT 1 VIEW  Result Date: 11/06/2019 CLINICAL DATA:  Weakness. EXAM: PORTABLE CHEST 1 VIEW COMPARISON:  November 05, 2019. FINDINGS: Stable cardiomegaly with possible central pulmonary vascular congestion. No pneumothorax is noted. Mild bibasilar atelectasis is noted with small pleural effusions. Bony thorax is unremarkable. IMPRESSION: Stable cardiomegaly with possible central pulmonary vascular congestion. Mild bibasilar subsegmental atelectasis is noted with small pleural effusions. Electronically Signed   By: Marijo Conception M.D.   On: 11/06/2019 15:57   DG Chest Port 1 View  Result Date: 11/05/2019 CLINICAL DATA:  Weakness. Found on floor today. EXAM: PORTABLE CHEST 1 VIEW COMPARISON:  10/18/2019 FINDINGS: Stable heart size and mediastinal contours with mild  cardiomegaly. Hazy opacity at both lung bases likely combination of pleural fluid and airspace disease/atelectasis. Vascular congestion. No pneumothorax. Left proximal humerus fracture appears subacute with surrounding heterotopic ossification, also seen on 09/17/2019 shoulder radiograph. IMPRESSION: 1. Hazy opacity at both lung bases likely combination of pleural fluid and airspace disease/atelectasis. This is stable on the right but progressive on the left from prior. 2. Stable mild cardiomegaly.  Vascular congestion. Electronically Signed   By: Keith Rake M.D.   On: 11/05/2019 17:02   VAS [place] LOWER EXTREMITY VENOUS (DVT)  Result Date: 11/07/2019  Lower Venous DVTStudy Indications: Swelling, and Edema.  Limitations: Body habitus and poor ultrasound/tissue interface. Comparison Study: 09/16/19 previous Performing Technologist: Abram Sander RVS  Examination Guidelines: A complete evaluation includes B-mode imaging, spectral Doppler, color Doppler, and power Doppler as needed of all accessible portions of each vessel. Bilateral testing is considered an integral part of a complete examination. Limited examinations for reoccurring indications may be performed as noted. The reflux portion of the exam is performed with the patient in reverse Trendelenburg.  +---------+---------------+---------+-----------+----------+--------------+ RIGHT    CompressibilityPhasicitySpontaneityPropertiesThrombus Aging +---------+---------------+---------+-----------+----------+--------------+ CFV      Full           Yes      Yes                                 +---------+---------------+---------+-----------+----------+--------------+ SFJ      Full                                                        +---------+---------------+---------+-----------+----------+--------------+ FV Prox  Full                                                         +---------+---------------+---------+-----------+----------+--------------+  FV Mid                                                Not visualized +---------+---------------+---------+-----------+----------+--------------+ FV Distal               Yes      Yes                                 +---------+---------------+---------+-----------+----------+--------------+ PFV      Full                                                        +---------+---------------+---------+-----------+----------+--------------+ POP      Full           Yes      Yes                                 +---------+---------------+---------+-----------+----------+--------------+ PTV                                                   Not visualized +---------+---------------+---------+-----------+----------+--------------+ PERO                                                  Not visualized +---------+---------------+---------+-----------+----------+--------------+   +---------+---------------+---------+-----------+----------+--------------+ LEFT     CompressibilityPhasicitySpontaneityPropertiesThrombus Aging +---------+---------------+---------+-----------+----------+--------------+ CFV      Full           Yes      Yes                                 +---------+---------------+---------+-----------+----------+--------------+ SFJ      Full                                                        +---------+---------------+---------+-----------+----------+--------------+ FV Prox  Full                                                        +---------+---------------+---------+-----------+----------+--------------+ FV Mid                  Yes      Yes                                 +---------+---------------+---------+-----------+----------+--------------+ FV Distal  Yes      Yes                                  +---------+---------------+---------+-----------+----------+--------------+ PFV      Full                                                        +---------+---------------+---------+-----------+----------+--------------+ POP      Full           Yes      Yes                                 +---------+---------------+---------+-----------+----------+--------------+ PTV      Full                                                        +---------+---------------+---------+-----------+----------+--------------+ PERO                                                  Not visualized +---------+---------------+---------+-----------+----------+--------------+     Summary: BILATERAL: - No evidence of deep vein thrombosis seen in the lower extremities, bilaterally.   *See table(s) above for measurements and observations. Electronically signed by Curt Jews MD on 11/07/2019 at 4:26:57 PM.    Final    [place] EKG SITE RITE  Result Date: 11/07/2019 If Site Rite image not attached, placement could not be confirmed due to current cardiac rhythm.  US Abdomen Limited RUQ  Result Date: 11/16/2019 CLINICAL DATA:  Cirrhosis EXAM: ULTRASOUND ABDOMEN LIMITED RIGHT UPPER QUADRANT COMPARISON:  11/07/2019 FINDINGS: Gallbladder: The gallbladder wall is thickened measuring approximately 8.6 mm in thickness. The sonographic Murphy sign cannot be adequately assessed. There is gallbladder sludge. There is some mild pericholecystic free fluid. Common bile duct: Diameter: 6 mm Liver: The liver surface is nodular. There is no discrete hepatic mass. Portal vein is patent on color Doppler imaging with normal direction of blood flow towards the liver. Other: There is a small volume of ascites in the upper abdomen. There is right-sided pelviectasis. The right kidney is echogenic. IMPRESSION: 1. There is gallbladder sludge with associated gallbladder wall thickening. The sonographic Murphy sign cannot be adequately assessed. If  there is high clinical suspicion for acute cholecystitis, follow-up with HIDA scan is recommended. 2. Nodular appearance of the liver which can be seen in patients with cirrhosis. There is no discrete hepatic mass. 3. Echogenic right kidney suggestive of underlying medical renal disease. 4. Right-sided pelviectasis of unknown clinical significance. This appears to be new since the patient's prior CT dated 11/07/2019 5. Small volume abdominal ascites. Electronically Signed   By: Constance Holster M.D.   On: 11/16/2019 17:14    Assessment/Plan  1. Hypotension due to drugs -  BPs low, will decrease Amlodipine from 10 mg to 5 mg daily - monitor BPs  2. Need for tetanus, diphtheria, and acellular pertussis (Tdap) vaccine -  Boostrix 0.5 ml IM X 1  3. Need for shingles vaccine - Shingrix 0.5 ml X 1    Family/ staff Communication:  Discussed plan of care with resident and charge nurse.  Labs/tests ordered:  None  Goals of care:   Short-term care   Durenda Age, DNP, FNP-BC [facility] [phone] (Monday-Friday 8:00 a.m. - 5:00 p.m.) [phone] (after hours)

## 2019-11-27 NOTE — Assessment & Plan Note (Signed)
Clinically stable Does NOT admit to COVID-19 diagnosis Difficult historian which raises question of "[place] brain fog" vs baseline neurocognitive deficit [facility] MS testing

## 2019-11-28 NOTE — Progress Notes (Signed)
Gynecologic Oncology Return Clinic Visit  12/02/19  Reason for Visit: IUD insertion  Treatment History: The patient was recently admitted (discharged on 3/17) with symptomatic anemia in the setting of PMB and was treated for MSSA bacteremia. She was started on IV cefazolin with a plan for 6 weeks of IV antibiotic therapy (EOT date 4/22). She received 3u of pRBCs, was started on Megace (improved bleeding), and EMB was performed revealing endometrial cancer, FIGO grade II, endometrioid type.  Most recently, the patient was admitted from 3/26-4/1 from her SNF secondary to anemia. She received an additional 2u pRBCs. GI was consulted and the patient underwent EGD on 3/30 with one small non-bleeding cratered duodenal ulcer noted. Biopsies negative for H Pylori. She was discharged on a PPI back to her SNF.  The patient presented again on 4/27 and was hospitalized for over 2 weeks after she presented with weakness and fever and was ultimately treated for sepsis secondary to COVID-19 pneumonia. She was discharged to a SNF on 5/13. Because of this admission, she missed an appointment with me in clinic to have her IUD placed.  Interval History: Patient reports overall doing well, says that she has slowly regained some strength since her last hospitalization.  Denies any difficulties breathing.  Continues to endorse a decreased appetite.  Is unsure how much vaginal bleeding she has been having.  She is currently at a SNF and they change her diapers several times a day.  She was told on one occasion that she had increased vaginal bleeding but otherwise no one is keeping her informed.  She is unsure whether she is still on Megace.  Denies any pelvic pain or cramping.  Past Medical/Surgical History: Past Medical History:  Diagnosis Date  . Anemia 10/09/2019  . Asthma   . CKD (chronic kidney disease) stage 3, GFR 30-59 ml/min   . Dental disease   . Diabetes mellitus without complication ([place])   .  Endometrial cancer ([place])   . Esophageal dysmotility   . Heart murmur   . HLD (hyperlipidemia)   . Hypertension   . MSSA bacteremia 09/2019  . Obesity, Class III, BMI 40-49.9 (morbid obesity) ([place])   . Pressure ulcer     Past Surgical History:  Procedure Laterality Date  . BIOPSY  10/08/2019   Procedure: BIOPSY;  Surgeon: Clarene Essex, MD;  Location: [facility] ENDOSCOPY;  Service: Endoscopy;;  . ESOPHAGOGASTRODUODENOSCOPY N/A 10/08/2019   Procedure: ESOPHAGOGASTRODUODENOSCOPY (EGD);  Surgeon: Clarene Essex, MD;  Location: [facility];  Service: Endoscopy;  Laterality: N/A;  . TYMPANOSTOMY TUBE PLACEMENT Bilateral     Family History  Problem Relation Age of Onset  . Stroke Mother   . Diabetes Mother   . Hypertension Mother   . Cancer Mother        possible ovarian  . Diabetes Brother   . Hypertension Brother   . Cancer Maternal Grandmother        possible ovarian    Social History   Socioeconomic History  . Marital status: Married    Spouse name: Not on file  . Number of children: Not on file  . Years of education: Not on file  . Highest education level: Not on file  Occupational History  . Not on file  Tobacco Use  . Smoking status: Former Research scientist (life sciences)  . Smokeless tobacco: Never Used  . Tobacco comment: " years ago ", >16yr  Substance and Sexual Activity  . Alcohol use: Yes    Comment: occasional  . Drug use:  No  . Sexual activity: Not Currently  Other Topics Concern  . Not on file  Social History Narrative  . Not on file   Social Determinants of Health   Financial Resource Strain:   . Difficulty of Paying Living Expenses:   Food Insecurity:   . Worried About Charity fundraiser in the Last Year:   . Arboriculturist in the Last Year:   Transportation Needs:   . Film/video editor (Medical):   Marland Kitchen Lack of Transportation (Non-Medical):   Physical Activity:   . Days of Exercise per Week:   . Minutes of Exercise per Session:   Stress:   . Feeling of Stress :    Social Connections:   . Frequency of Communication with Friends and Family:   . Frequency of Social Gatherings with Friends and Family:   . Attends Religious Services:   . Active Member of Clubs or Organizations:   . Attends Archivist Meetings:   Marland Kitchen Marital Status:     Current Medications:  Current Outpatient Medications:  .  acetaminophen (TYLENOL) 500 MG tablet, Take 500 mg by mouth every 6 (six) hours as needed for moderate pain., Disp: , Rfl:  .  Amino Acids-Protein Hydrolys (FEEDING SUPPLEMENT, PRO-STAT SUGAR FREE 64,) LIQD, Take 30 mLs by mouth 3 (three) times daily with meals., Disp: 887 mL, Rfl: 0 .  amLODipine (NORVASC) 10 MG tablet, Take 1 tablet (10 mg total) by mouth daily., Disp:  , Rfl:  .  benzocaine (ORAJEL) 10 % mucosal gel, Use as directed 1 application in the mouth or throat 4 (four) times daily as needed for mouth pain., Disp: , Rfl:  .  carvedilol (COREG) 12.5 MG tablet, Take 1 tablet (12.5 mg total) by mouth 2 (two) times daily with a meal., Disp: 60 tablet, Rfl: 0 .  Chlorhexidine Gluconate Cloth 2 % PADS, Apply 6 each topically daily., Disp:  , Rfl:  .  cloNIDine (CATAPRES) 0.2 MG tablet, Take 1 tablet (0.2 mg total) by mouth 2 (two) times daily., Disp:  , Rfl:  .  feeding supplement, ENSURE ENLIVE, (ENSURE ENLIVE) LIQD, Take 237 mLs by mouth 3 (three) times daily between meals., Disp: 237 mL, Rfl: 12 .  ferrous sulfate 325 (65 FE) MG tablet, Take 325 mg by mouth 2 (two) times daily with a meal., Disp: , Rfl:  .  furosemide (LASIX) 40 MG tablet, Take 40 mg by mouth daily., Disp: , Rfl:  .  hydrALAZINE (APRESOLINE) 50 MG tablet, Take 2 tablets (100 mg total) by mouth every 8 (eight) hours., Disp: , Rfl:  .  levonorgestrel (MIRENA) 20 MCG/24HR IUD, 1 each by Intrauterine route once., Disp: , Rfl:  .  loratadine (CLARITIN) 10 MG tablet, Take 10 mg by mouth daily as needed for allergies., Disp: , Rfl:  .  pantoprazole sodium (PROTONIX) 40 mg/20 mL PACK, Take  20 mLs (40 mg total) by mouth daily., Disp: 30 mL, Rfl:  .  polyethylene glycol (MIRALAX / GLYCOLAX) 17 g packet, Take 17 g by mouth daily., Disp: 14 each, Rfl: 0 .  potassium chloride (KLOR-CON) 10 MEQ tablet, Take 2 tablets (20 mEq total) by mouth daily., Disp: , Rfl:  .  Blood Glucose Monitoring Suppl (BAYER CONTOUR MONITOR) w/Device KIT, Use as directed for 3 times daily testing of blood glucose. E11.9 (Patient not taking: Reported on 11/22/2019), Disp: 1 kit, Rfl: 0 .  glucose blood (BAYER CONTOUR TEST) test strip, Use as instructed for  3 times daily testing of blood glucose. E11.9 (Patient not taking: Reported on 11/22/2019), Disp: 100 each, Rfl: 12 .  INSULIN SYRINGE 1CC/29G 29G X 1/2" 1 ML MISC, Insulin syringes (Patient not taking: Reported on 11/22/2019), Disp: 100 each, Rfl: 0 .  Lancets (ONETOUCH ULTRASOFT) lancets, Use as instructed (Patient not taking: Reported on 11/22/2019), Disp: 100 each, Rfl: 12  Review of Systems: Denies fevers, chills, fatigue, unexplained weight changes. Denies hearing loss, neck lumps or masses, mouth sores, ringing in ears or voice changes. Denies cough or wheezing.  Denies shortness of breath. Denies chest pain or palpitations. Denies leg swelling. Denies abdominal distention, pain, blood in stools, constipation, diarrhea, nausea, vomiting, or early satiety. Denies pain with intercourse, dysuria, frequency, hematuria or incontinence. Denies hot flashes, pelvic pain or vaginal discharge.   Denies joint pain, back pain or muscle pain/cramps. Denies itching, rash, or wounds. Denies dizziness, headaches, numbness or seizures. Denies swollen lymph nodes or glands, denies easy bruising or bleeding. Denies anxiety, depression, confusion, or decreased concentration.  Physical Exam: BP (!) 143/71 (BP Location: Left Arm, Patient Position: Sitting)   Pulse 61   Temp 98.2 F (36.8 C) (Temporal)   Resp 17   Ht 5' (1.524 m)   LMP 03/17/2015   SpO2 100%   BMI  39.29 kg/m  General: Alert, oriented, no acute distress. HEENT: Normocephalic, atraumatic, sclera anicteric. Chest: Unlabored breathing on room air. Abdomen: Obese, soft, nontender.  Normoactive bowel sounds.  No masses or hepatosplenomegaly appreciated.  Significant periumbilical and supraumbilical diastases versus hernia. Extremities: Somewhat limited range of motion.  Warm, well perfused.  1-2+ edema bilaterally. GU: Normal appearing external genitalia without erythema, excoriation, or lesions.  Speculum exam reveals somewhat dilated urethra with cystic area on the distal inferior aspect of the urethra that patient has some pain with palpation.  No distinct lesion.  Cervix normal in appearance, minimal vaginal blood in the vault.  Bimanual exam reveals axial uterus, size somewhat difficult to appreciate secondary to body habitus.    Mirena IUD insertion  The procedure was explained and verbal consent obtained.  The speculum was placed and the cervix was prepped with betadine.  Single-tooth tenaculum was placed on the anterior lip of the cervix.  The endometrial Pipelle was inserted into the uterus, which sounded just under 8 cm.  Mirena IUD lot [id number], expiration June 2023 was then inserted to the fundus and deployed.  The inserter was removed.  Strings were cut at approximately 3-4 cm. The patient tolerated procedure well.  Laboratory & Radiologic Studies: CT C/A/P on 4/29: IMPRESSION: 1. Multifocal geographic patchy ground-glass opacities throughout both lungs, consistent with COVID-19 pneumonia. 2. Fluid overload. Moderate right and small left pleural effusion. Small to moderate volume abdominopelvic ascites. Generalized subcutaneous edema of the chest and abdomen consistent with third-spacing, confluent edema in the flanks. 3. Question of nodular hepatic contours, recommend correlation for cirrhosis. 4. Chronic left renal atrophy. Unchanged 7 mm calcification in the left pelvis  equivocally within the ureter. No hydronephrosis.  Assessment & Plan: Vanessa [name] is a 64 y.o. woman with clinical Stage 1 grade 2 endometrial cancer who presents for Mirena IUD insertion.  Mirena IUD placed today for hormonal treatment in this nonsurgical candidate.  Patient tolerated this well.  We discussed plan for follow-up in 3 months with repeat biopsy in either 3 or 6 months.  Unless something significant were to change with the patient, she is not a surgical candidate.  On her recent imaging,  there continues to be no evidence of metastatic disease.   It is difficult to tell, but from what I understand, the patient has not been on Megace recently.  I do not know what her vaginal bleeding has been but we discussed that hopefully the IUD will help decrease and may ultimately stop her bleeding.  22 minutes of total time was spent for this patient encounter, including preparation, face-to-face counseling with the patient and coordination of care, and documentation of the encounter.  Jeral Pinch, MD  Division of Gynecologic Oncology  Department of Obstetrics and Gynecology  [facility]

## 2019-11-29 LAB — HEPATIC FUNCTION PANEL
ALT: 18 (ref 7–35)
AST: 17 (ref 13–35)
Alkaline Phosphatase: 165 — AB (ref 25–125)
Bilirubin, Total: 0.4

## 2019-11-29 LAB — COMPREHENSIVE METABOLIC PANEL
Albumin: 3.5 (ref 3.5–5.0)
Calcium: 8.7 (ref 8.7–10.7)
GFR calc Af Amer: 17.66
GFR calc non Af Amer: 15.24
Globulin: 2.8

## 2019-11-29 LAB — CBC AND DIFFERENTIAL
HCT: 26 — AB (ref 36–46)
Hemoglobin: 8.5 — AB (ref 12.0–16.0)
Neutrophils Absolute: 4
Platelets: 98 — AB (ref 150–399)
WBC: 4.6

## 2019-11-29 LAB — BASIC METABOLIC PANEL
BUN: 74 — AB (ref 4–21)
CO2: 25 — AB (ref 13–22)
Chloride: 105 (ref 99–108)
Creatinine: 3.1 — AB (ref 0.5–1.1)
Glucose: 142
Potassium: 5.8 — AB (ref 3.4–5.3)
Sodium: 145 (ref 137–147)

## 2019-11-29 LAB — CBC: RBC: 2.88 — AB (ref 3.87–5.11)

## 2019-12-02 ENCOUNTER — Other Ambulatory Visit: Payer: Self-pay

## 2019-12-02 ENCOUNTER — Inpatient Hospital Stay: Payer: No Typology Code available for payment source | Attending: Gynecologic Oncology | Admitting: Gynecologic Oncology

## 2019-12-02 VITALS — BP 143/71 | HR 61 | Temp 98.2°F | Resp 17 | Ht 60.0 in

## 2019-12-02 DIAGNOSIS — C541 Malignant neoplasm of endometrium: Secondary | ICD-10-CM | POA: Insufficient documentation

## 2019-12-02 DIAGNOSIS — Z30430 Encounter for insertion of intrauterine contraceptive device: Secondary | ICD-10-CM | POA: Insufficient documentation

## 2019-12-02 LAB — COMPREHENSIVE METABOLIC PANEL
Calcium: 8.3 — AB (ref 8.7–10.7)
GFR calc Af Amer: 16.07
GFR calc non Af Amer: 15

## 2019-12-02 LAB — BASIC METABOLIC PANEL
BUN: 70 — AB (ref 4–21)
CO2: 21 (ref 13–22)
Chloride: 109 — AB (ref 99–108)
Creatinine: 3.3 — AB (ref 0.5–1.1)
Glucose: 177
Potassium: 4.8 (ref 3.4–5.3)
Sodium: 149 — AB (ref 137–147)

## 2019-12-02 NOTE — Patient Instructions (Signed)
I was able to place the hormonal IUD today without any difficulty.  I will plan to see you back in 3 months to see how your bleeding is and to repeat a biopsy.  If you have any problems before then, please call at [phone].

## 2019-12-03 ENCOUNTER — Non-Acute Institutional Stay (SKILLED_NURSING_FACILITY): Payer: Medicare Other | Admitting: Internal Medicine

## 2019-12-03 DIAGNOSIS — N179 Acute kidney failure, unspecified: Secondary | ICD-10-CM | POA: Diagnosis not present

## 2019-12-03 DIAGNOSIS — C541 Malignant neoplasm of endometrium: Secondary | ICD-10-CM

## 2019-12-03 DIAGNOSIS — I10 Essential (primary) hypertension: Secondary | ICD-10-CM | POA: Diagnosis not present

## 2019-12-03 DIAGNOSIS — R609 Edema, unspecified: Secondary | ICD-10-CM

## 2019-12-03 NOTE — Assessment & Plan Note (Addendum)
Decrease hydralazine to 50 mg every 8 hours and monitor blood pressure. If control maintained with lower dose of hydralazine; consider D/C CCB as it may be factor in peripheral edema

## 2019-12-03 NOTE — Progress Notes (Signed)
   NURSING HOME LOCATION:  [facility] ROOM NUMBER:  [id number]  CODE STATUS:  Full Code  PCP:  Pascal Lux MD while @ SNF   This is a nursing facility follow up for specific acute issue of abnormal labs, specifically elevated & rising creatinine.  Interim medical record and care since last [place] visit was updated with review of diagnostic studies and change in clinical status since last visit were documented.  HPI: On 11/20/2019 creatinine was 1.66 which appears to be her baseline value. BUN was 34 and GFR 37.   On 5/24 creatinine was 3.32, BUN 70.4, and GFR 16.07, compatible with stage IV CKD. She received IV fluids over the weekend. Other than furosemide 40 mg daily there are no definite nephrotoxic drugs being administered. Albumin has been as low as 2.2 & protein supplement has been ordered.Staff reports that she is just sipping on the protein supplement and has to be encouraged to drink the supplement as well as other fluids.  She was seen by Dr Berline Lopes, her Gynecologist yesterday for IUD placement.  She had been on Megace for stage I grade 2 endometrial cancer. She was not felt to be a surgical candidate.  She notes intermittent hematuria. She described persistent anorexia @ that visit.  Review of systems: The patient states that she does wake up at night short of breath but validates that the edema is better.  She describes diffuse itching and questions whether she has been receiving her Claritin.  Constitutional: No fever, significant weight change  Cardiovascular: No chest pain, palpitations  Respiratory: No cough, sputum production, hemoptysis Gastrointestinal: No heartburn, dysphagia, abdominal pain, nausea /vomiting, rectal bleeding, melena, change in bowels Genitourinary: No dysuria, pyuria, incontinence, nocturia Neurologic: No dizziness, headache, syncope, seizures Hematologic/lymphatic: No significant bruising, lymphadenopathy, abnormal  bleeding Allergy/immunology: No urticaria, angioedema  Physical exam:  Pertinent or positive findings: She is sitting in the wheelchair without respiratory distress.  Heart rate is slow.  Her chest is surprisingly clear.  Abdomen is protuberant.  She has 1/2-1+ edema.  Pedal pulses not palpable.  She has scattered hyperpigmented lesions over the upper and lower extremities with some keratotic changes over the shins.  General appearance: Adequately nourished; no acute distress, increased work of breathing is present.   Lymphatic: No lymphadenopathy about the head, neck, axilla. Eyes: No conjunctival inflammation or lid edema is present. There is no scleral icterus. Neck:  No thyromegaly, masses, tenderness noted.    Heart:  No murmur, click, rub .  Lungs:  without wheezes, rhonchi, rales, rubs. Abdomen: Bowel sounds are normal. Abdomen is soft and nontender with no organomegaly, hernias, masses. GU: Deferred  Extremities:  No cyanosis, clubbing Skin: Warm & dry w/o tenting.  See summary under each active problem in the Problem List with associated updated therapeutic plan

## 2019-12-03 NOTE — Assessment & Plan Note (Addendum)
Superimposed on CKD Creatinine has risen from 1.66 up to 3.32.  IV fluids over the weekend made no meaningful effect.  Lasix will be held and renal function monitored.  Staff reports poor fluid intake.  Increased oral fluid intake was discussed with the patient.

## 2019-12-03 NOTE — Assessment & Plan Note (Signed)
Dr. Michael Litter, Gyn reinserted the IUD yesterday

## 2019-12-04 ENCOUNTER — Encounter: Payer: Self-pay | Admitting: Internal Medicine

## 2019-12-04 DIAGNOSIS — R609 Edema, unspecified: Secondary | ICD-10-CM | POA: Insufficient documentation

## 2019-12-04 NOTE — Patient Instructions (Signed)
See assessment and plan under each diagnosis in the problem list and acutely for this visit 

## 2019-12-04 NOTE — Assessment & Plan Note (Addendum)
Consider D/C of CCB if BP remains well controlled Urged to consume protein supplement bid

## 2019-12-05 LAB — COMPREHENSIVE METABOLIC PANEL
Albumin: 3.4 — AB (ref 3.5–5.0)
Calcium: 8.8 (ref 8.7–10.7)
GFR calc Af Amer: 19
GFR calc non Af Amer: 16.39

## 2019-12-05 LAB — BASIC METABOLIC PANEL
BUN: 53 — AB (ref 4–21)
CO2: 21 (ref 13–22)
Chloride: 109 — AB (ref 99–108)
Creatinine: 2.9 — AB (ref 0.5–1.1)
Glucose: 163
Potassium: 4.5 (ref 3.4–5.3)
Sodium: 148 — AB (ref 137–147)

## 2019-12-05 LAB — HEPATIC FUNCTION PANEL
ALT: 12 (ref 7–35)
AST: 14 (ref 13–35)
Alkaline Phosphatase: 202 — AB (ref 25–125)
Bilirubin, Direct: 0.2 (ref 0.01–0.4)
Bilirubin, Total: 0.4

## 2019-12-05 LAB — CBC AND DIFFERENTIAL
HCT: 27 — AB (ref 36–46)
Hemoglobin: 8.8 — AB (ref 12.0–16.0)
Platelets: 118 — AB (ref 150–399)
WBC: 4.4

## 2019-12-05 LAB — CBC: RBC: 2.99 — AB (ref 3.87–5.11)

## 2019-12-10 LAB — BASIC METABOLIC PANEL
BUN: 32 — AB (ref 4–21)
CO2: 23 — AB (ref 13–22)
Chloride: 110 — AB (ref 99–108)
Creatinine: 2 — AB (ref 0.5–1.1)
Glucose: 130
Potassium: 4.1 (ref 3.4–5.3)
Sodium: 146 (ref 137–147)

## 2019-12-10 LAB — CBC AND DIFFERENTIAL
HCT: 26 — AB (ref 36–46)
Hemoglobin: 8.3 — AB (ref 12.0–16.0)
Neutrophils Absolute: 3
Platelets: 118 — AB (ref 150–399)
WBC: 4.4

## 2019-12-10 LAB — COMPREHENSIVE METABOLIC PANEL
Calcium: 9 (ref 8.7–10.7)
GFR calc Af Amer: 29.83
GFR calc non Af Amer: 25.74

## 2019-12-10 LAB — CBC: RBC: 2.83 — AB (ref 3.87–5.11)

## 2019-12-11 ENCOUNTER — Encounter: Payer: Self-pay | Admitting: Internal Medicine

## 2019-12-11 ENCOUNTER — Non-Acute Institutional Stay (SKILLED_NURSING_FACILITY): Payer: Medicare Other | Admitting: Adult Health

## 2019-12-11 ENCOUNTER — Encounter: Payer: Self-pay | Admitting: Adult Health

## 2019-12-11 DIAGNOSIS — K269 Duodenal ulcer, unspecified as acute or chronic, without hemorrhage or perforation: Secondary | ICD-10-CM

## 2019-12-11 DIAGNOSIS — N1832 Chronic kidney disease, stage 3b: Secondary | ICD-10-CM

## 2019-12-11 DIAGNOSIS — E1121 Type 2 diabetes mellitus with diabetic nephropathy: Secondary | ICD-10-CM

## 2019-12-11 DIAGNOSIS — D500 Iron deficiency anemia secondary to blood loss (chronic): Secondary | ICD-10-CM

## 2019-12-11 DIAGNOSIS — I10 Essential (primary) hypertension: Secondary | ICD-10-CM | POA: Diagnosis not present

## 2019-12-11 DIAGNOSIS — E1122 Type 2 diabetes mellitus with diabetic chronic kidney disease: Secondary | ICD-10-CM

## 2019-12-11 DIAGNOSIS — R29818 Other symptoms and signs involving the nervous system: Secondary | ICD-10-CM | POA: Insufficient documentation

## 2019-12-11 NOTE — Progress Notes (Addendum)
Location:  [place] Room Number: 112-A Place of Service:  SNF (31) Provider:  Durenda Age, DNP, FNP-BC  Patient Care Team: Maren Reamer, MD (Inactive) as PCP - General (Internal Medicine)  Extended Emergency Contact Information Primary Emergency Contact: Jennette Dubin of [place] Phone: [phone] Mobile Phone: [phone] Relation: Spouse  Code Status:  FULL CODE  Goals of care: Advanced Directive information Advanced Directives 11/06/2019  Does Patient Have a Medical Advance Directive? No  Would patient like information on creating a medical advance directive? No - Patient declined     Chief Complaint  Patient presents with  . Medical Management of Chronic Issues    Routine short-term rehabilitation visit    HPI:  Pt is a 64 y.o. female seen today for medical management of chronic diseases.  She is a short-term care resident of [facility]. She has a PMH of hypertension, diabetes mellitus, hyperlipidemia, endometrial cancer, chronic kidney disease stage IIIb and chronic diastolic failure. He was treated for AKI on 11/30/19, creatinine up at 3.07 and GFR 17.66. He was give 1/2 NS 1L, Lasix and Amlodipine was discontinued, and Hydralazine was decreased from 100 mg every 8 hours to 50 mg every 8 hours. Latest creatinine done on 12/10/19 is 1.99 and GFR 29.83, improved. SBPs ranging from 135 to 166. She denies headaches.   Past Medical History:  Diagnosis Date  . Anemia 10/09/2019  . Asthma   . CKD (chronic kidney disease) stage 3, GFR 30-59 ml/min   . Dental disease   . Diabetes mellitus without complication ([place])   . Endometrial cancer ([place])   . Esophageal dysmotility   . Heart murmur   . HLD (hyperlipidemia)   . Hypertension   . MSSA bacteremia 09/2019  . Obesity, Class III, BMI 40-49.9 (morbid obesity) ([place])   . Pressure ulcer    Past Surgical History:  Procedure Laterality Date    . BIOPSY  10/08/2019   Procedure: BIOPSY;  Surgeon: Clarene Essex, MD;  Location: [facility] ENDOSCOPY;  Service: Endoscopy;;  . ESOPHAGOGASTRODUODENOSCOPY N/A 10/08/2019   Procedure: ESOPHAGOGASTRODUODENOSCOPY (EGD);  Surgeon: Clarene Essex, MD;  Location: [place];  Service: Endoscopy;  Laterality: N/A;  . TYMPANOSTOMY TUBE PLACEMENT Bilateral     Allergies  Allergen Reactions  . Ace Inhibitors     Never prescribed but she has had angioedema  . Angiotensin Receptor Blockers     ARB never Rxed but PMH  of angioedema in March 2021    Outpatient Encounter Medications as of 12/11/2019  Medication Sig  . acetaminophen (TYLENOL) 500 MG tablet Take 500 mg by mouth every 6 (six) hours as needed for moderate pain.  . Amino Acids-Protein Hydrolys (FEEDING SUPPLEMENT, PRO-STAT SUGAR FREE 64,) LIQD Take 30 mLs by mouth 3 (three) times daily with meals.  . benzocaine (ORAJEL) 10 % mucosal gel Use as directed 1 application in the mouth or throat 4 (four) times daily as needed for mouth pain.  . carvedilol (COREG) 6.25 MG tablet Take 6.25 mg by mouth 2 (two) times daily with a meal.  . Chlorhexidine Gluconate Cloth 2 % PADS Apply 6 each topically daily.  . cloNIDine (CATAPRES) 0.2 MG tablet Take 1 tablet (0.2 mg total) by mouth 2 (two) times daily.  . feeding supplement, ENSURE ENLIVE, (ENSURE ENLIVE) LIQD Take 237 mLs by mouth 3 (three) times daily between meals.  . ferrous sulfate 325 (65 FE) MG tablet Take 325 mg by mouth 2 (two) times daily  with a meal.  . hydrALAZINE (APRESOLINE) 50 MG tablet Take 50 mg by mouth 3 (three) times daily.  . hydrocortisone (PROCTOZONE-HC) 2.5 % rectal cream Place 1 application rectally 2 (two) times daily as needed for hemorrhoids or anal itching.  . loratadine (CLARITIN) 10 MG tablet Take 10 mg by mouth daily as needed for allergies.  . pantoprazole sodium (PROTONIX) 40 mg/20 mL PACK Take 20 mLs (40 mg total) by mouth daily.  . polyethylene glycol (MIRALAX / GLYCOLAX) 17 g  packet Take 17 g by mouth daily.  . Blood Glucose Monitoring Suppl (BAYER CONTOUR MONITOR) w/Device KIT Use as directed for 3 times daily testing of blood glucose. E11.9 (Patient not taking: Reported on 12/11/2019)  . glucose blood (BAYER CONTOUR TEST) test strip Use as instructed for 3 times daily testing of blood glucose. E11.9 (Patient not taking: Reported on 12/11/2019)  . INSULIN SYRINGE 1CC/29G 29G X 1/2" 1 ML MISC Insulin syringes (Patient not taking: Reported on 12/11/2019)  . Lancets (ONETOUCH ULTRASOFT) lancets Use as instructed (Patient not taking: Reported on 12/11/2019)  . [DISCONTINUED] amLODipine (NORVASC) 10 MG tablet Take 1 tablet (10 mg total) by mouth daily.  . [DISCONTINUED] carvedilol (COREG) 12.5 MG tablet Take 1 tablet (12.5 mg total) by mouth 2 (two) times daily with a meal.  . [DISCONTINUED] furosemide (LASIX) 40 MG tablet Take 40 mg by mouth daily.  . [DISCONTINUED] hydrALAZINE (APRESOLINE) 50 MG tablet Take 2 tablets (100 mg total) by mouth every 8 (eight) hours.  . [DISCONTINUED] levonorgestrel (MIRENA) 20 MCG/24HR IUD 1 each by Intrauterine route once.  . [DISCONTINUED] potassium chloride (KLOR-CON) 10 MEQ tablet Take 2 tablets (20 mEq total) by mouth daily.   No facility-administered encounter medications on file as of 12/11/2019.    Review of Systems  GENERAL: No change in appetite, no fatigue, no weight changes, no fever, chills or weakness MOUTH and THROAT: Denies oral discomfort, gingival pain or bleeding RESPIRATORY: no cough, SOB, DOE, wheezing, hemoptysis CARDIAC: No chest pain or palpitations GI: No abdominal pain, diarrhea, constipation, heart burn, nausea or vomiting GU: Denies dysuria, frequency, hematuria, incontinence, or discharge NEUROLOGICAL: Denies dizziness, syncope, numbness, or headache PSYCHIATRIC: Denies feelings of depression or anxiety. No report of hallucinations, insomnia, paranoia, or agitation   Immunization History  Administered Date(s)  Administered  . Influenza,inj,Quad PF,6+ Mos 08/03/2015, 03/24/2016  . PFIZER SARS-COV-2 Vaccination 09/12/2019  . Pneumococcal Polysaccharide-23 03/24/2016  . Tdap 08/15/2016   Pertinent  Health Maintenance Due  Topic Date Due  . MAMMOGRAM  Never done  . COLONOSCOPY  Never done  . OPHTHALMOLOGY EXAM  09/08/2017  . FOOT EXAM  11/29/2017  . DEXA SCAN  Never done  . PNA vac Low Risk Adult (1 of 2 - PCV13) 08/23/2019  . INFLUENZA VACCINE  02/09/2020  . HEMOGLOBIN A1C  03/17/2020  . PAP SMEAR-Modifier  09/19/2022   Fall Risk  12/21/2015 04/06/2014 03/10/2014  Falls in the past year? No No No     Vitals:   12/11/19 0825  BP: 135/74  Pulse: 79  Resp: 17  Temp: 97.7 F (36.5 C)  TempSrc: Oral  SpO2: 94%  Weight: 198 lb (89.8 kg)  Height: 5' (1.524 m)   Body mass index is 38.67 kg/m.  Physical Exam  GENERAL APPEARANCE: Well nourished. In no acute distress. Morbidly obese SKIN:  Skin is warm and dry.  MOUTH and THROAT: Lips are without lesions. Oral mucosa is moist and without lesions. Tongue is normal in shape, size, and  color and without lesions RESPIRATORY: Breathing is even & unlabored, BS CTAB CARDIAC: RRR, no murmur,no extra heart sounds, BLE 2+ edema GI: Abdomen soft, normal BS, no masses, no tenderness EXTREMITIES:  Able to move X 4 extremities NEUROLOGICAL: There is no tremor. Speech is clear. Alert and oriented X 3. PSYCHIATRIC:  Affect and behavior are appropriate  Labs reviewed: Recent Labs    09/15/19 0616 09/15/19 0616 09/16/19 0139 09/17/19 0317 11/07/19 0441 11/08/19 0437 11/12/19 0519 11/12/19 0519 11/13/19 0547 11/13/19 0547 11/14/19 0259 11/15/19 0206 11/18/19 0328 11/18/19 0328 11/19/19 0255 11/19/19 0255 11/20/19 0459 11/29/19 0000 12/02/19 0000 12/05/19 0000 12/10/19 0000  NA 140   < > 142   < > 149*   < > 146*   < > 146*   < > 147*   < > 152*   < > 149*   < > 143   < > 149* 148* 146  K 2.7*   < > 3.5   < > 3.2*   < > 3.1*   < >  3.0*   < > 3.7   < > 3.5   < > 3.6   < > 3.9   < > 4.8 4.5 4.1  CL 109   < > 113*   < > 112*   < > 108   < > 110   < > 113*   < > 114*   < > 112*   < > 107   < > 109* 109* 110*  CO2 19*   < > 19*   < > 26   < > 25   < > 25   < > 25   < > 30   < > 29   < > 29   < > 21 21 23*  GLUCOSE 92   < > 126*   < > 110*   < > 130*   < > 126*   < > 138*   < > 134*  --  149*  --  166*  --   --   --   --   BUN 23   < > 27*   < > 28*   < > 42*   < > 44*   < > 48*   < > 42*   < > 38*   < > 34*   < > 70* 53* 32*  CREATININE 1.78*   < > 2.02*   < > 1.76*   < > 1.79*   < > 1.77*   < > 1.67*   < > 1.70*   < > 1.67*   < > 1.66*   < > 3.3* 2.9* 2.0*  CALCIUM 8.3*   < > 8.3*   < > 8.5*   < > 8.3*   < > 8.3*   < > 8.0*   < > 9.0   < > 8.9   < > 9.0   < > 8.3* 8.8 9.0  MG 2.1   < > 2.2  --  1.8   < > 1.7  --  1.8  --  1.8  --   --   --   --   --   --   --   --   --   --   PHOS 3.9  --  3.3  --  4.2  --   --   --   --   --   --   --   --   --   --   --   --   --   --   --   --    < > =  values in this interval not displayed.   Recent Labs    11/17/19 0424 11/17/19 0424 11/18/19 0328 11/18/19 0328 11/19/19 0255 11/29/19 0000 12/05/19 0000  AST 10*   < > 11*   < > 12* 17 14  ALT 6   < > 6   < > 8 18 12   ALKPHOS 79   < > 86   < > 81 165* 202*  BILITOT 1.0  --  0.7  --  1.0  --   --   PROT 6.2*  --  5.9*  --  6.0*  --   --   ALBUMIN 3.2*   < > 3.1*   < > 2.9* 3.5 3.4*   < > = values in this interval not displayed.   Recent Labs    11/14/19 0259 11/15/19 0206 11/18/19 0328 11/18/19 0328 11/19/19 0255 11/19/19 0255 11/20/19 0459 11/20/19 0459 11/29/19 0000 12/05/19 0000 12/10/19 0000  WBC 5.0   < > 6.1   < > 7.3   < > 6.1  --  4.6 4.4 4.4  NEUTROABS 4.3  --   --   --   --   --   --   --  4  --  3  HGB 8.9*   < > 8.2*   < > 8.2*   < > 8.5*   < > 8.5* 8.8* 8.3*  HCT 29.5*   < > 27.9*   < > 27.3*   < > 28.2*   < > 26* 27* 26*  MCV 95.8   < > 98.6  --  97.8  --  97.6  --   --   --   --   PLT 101*   < > 156    < > 165   < > 190   < > 98* 118* 118*   < > = values in this interval not displayed.   Lab Results  Component Value Date   TSH 4.135 11/13/2019   Lab Results  Component Value Date   HGBA1C 5.7 (H) 09/15/2019   Lab Results  Component Value Date   CHOL 153 08/10/2015   HDL 61 08/10/2015   LDLCALC 76 08/10/2015   TRIG 82 08/10/2015   CHOLHDL 2.5 08/10/2015    Significant Diagnostic Results in last 30 days:  DG CHEST PORT 1 VIEW  Result Date: 11/16/2019 CLINICAL DATA:  Shortness of breath.  COVID-19 positive EXAM: PORTABLE CHEST 1 VIEW COMPARISON:  Nov 09, 2019 FINDINGS: Central catheter tip is in the superior vena cava. No pneumothorax. Airspace opacity is noted bilaterally, more extensive on the right than on the left. There has been slight interval clearing on the left without appreciable change on the right compared to most recent study. This opacity is primarily in a perihilar distribution bilaterally. There is a small layering right pleural effusion. There is cardiomegaly with pulmonary venous hypertension. No adenopathy. No bone lesions. IMPRESSION: Cardiomegaly with pulmonary vascular congestion. Airspace opacity is noted bilaterally, more on the right than on the left with partial clearing from the left upper lobe and left base compared to previous study. No change on the right. Small right pleural effusion. Suspect multifocal pneumonia, although there may well be superimposed pulmonary edema. Question both pneumonia and congestive heart failure concurrent. Electronically Signed   By: Lowella Grip III M.D.   On: 11/16/2019 09:49   US Abdomen Limited RUQ  Result Date: 11/16/2019 CLINICAL DATA:  Cirrhosis EXAM: ULTRASOUND ABDOMEN LIMITED RIGHT UPPER  QUADRANT COMPARISON:  11/07/2019 FINDINGS: Gallbladder: The gallbladder wall is thickened measuring approximately 8.6 mm in thickness. The sonographic Murphy sign cannot be adequately assessed. There is gallbladder sludge. There is some  mild pericholecystic free fluid. Common bile duct: Diameter: 6 mm Liver: The liver surface is nodular. There is no discrete hepatic mass. Portal vein is patent on color Doppler imaging with normal direction of blood flow towards the liver. Other: There is a small volume of ascites in the upper abdomen. There is right-sided pelviectasis. The right kidney is echogenic. IMPRESSION: 1. There is gallbladder sludge with associated gallbladder wall thickening. The sonographic Murphy sign cannot be adequately assessed. If there is high clinical suspicion for acute cholecystitis, follow-up with HIDA scan is recommended. 2. Nodular appearance of the liver which can be seen in patients with cirrhosis. There is no discrete hepatic mass. 3. Echogenic right kidney suggestive of underlying medical renal disease. 4. Right-sided pelviectasis of unknown clinical significance. This appears to be new since the patient's prior CT dated 11/07/2019 5. Small volume abdominal ascites. Electronically Signed   By: Constance Holster M.D.   On: 11/16/2019 17:14    Assessment/Plan  1. Essential hypertension - BPs elevated, will increase Hydralazine from 50 mg every 8 hours to 100 mg every 8 hours -Continue clonidine 0.2 mg twice a day and carvedilol 6.25 mg 1 tab twice a day  2. Anemia due to chronic blood loss Lab Results  Component Value Date   HGB 8.3 (A) 12/10/2019   -Continue ferrous sulfate 325 mg 1 tab twice a day  3. Duodenal ulcer -Continue pantoprazole 40 mg daily  4. Type 2 diabetes mellitus with stage 3b chronic kidney disease, without long term insulin use ([place]) Lab Results  Component Value Date   HGBA1C 5.7 (H) 09/15/2019   -Diet controlled     Family/ staff Communication:  Discussed plan of care with resident and charge nurse.  Labs/tests ordered:  None  Goals of care:   Short-term care    Durenda Age, DNP, FNP-BC [facility] [phone] (Monday-Friday  8:00 a.m. - 5:00 p.m.) [phone] (after hours)

## 2019-12-13 ENCOUNTER — Non-Acute Institutional Stay (SKILLED_NURSING_FACILITY): Payer: Medicare Other | Admitting: Adult Health

## 2019-12-13 ENCOUNTER — Encounter: Payer: Self-pay | Admitting: Adult Health

## 2019-12-13 DIAGNOSIS — C541 Malignant neoplasm of endometrium: Secondary | ICD-10-CM

## 2019-12-13 DIAGNOSIS — K269 Duodenal ulcer, unspecified as acute or chronic, without hemorrhage or perforation: Secondary | ICD-10-CM | POA: Diagnosis not present

## 2019-12-13 DIAGNOSIS — E1121 Type 2 diabetes mellitus with diabetic nephropathy: Secondary | ICD-10-CM

## 2019-12-13 DIAGNOSIS — I10 Essential (primary) hypertension: Secondary | ICD-10-CM

## 2019-12-13 DIAGNOSIS — R531 Weakness: Secondary | ICD-10-CM

## 2019-12-13 DIAGNOSIS — J1282 Pneumonia due to coronavirus disease 2019: Secondary | ICD-10-CM

## 2019-12-13 DIAGNOSIS — R609 Edema, unspecified: Secondary | ICD-10-CM

## 2019-12-13 DIAGNOSIS — U071 COVID-19: Secondary | ICD-10-CM

## 2019-12-13 DIAGNOSIS — N1832 Chronic kidney disease, stage 3b: Secondary | ICD-10-CM

## 2019-12-13 DIAGNOSIS — D500 Iron deficiency anemia secondary to blood loss (chronic): Secondary | ICD-10-CM

## 2019-12-13 DIAGNOSIS — T68XXXS Hypothermia, sequela: Secondary | ICD-10-CM

## 2019-12-13 MED ORDER — CARVEDILOL 6.25 MG PO TABS: 6.2500 mg | ORAL_TABLET | Freq: Two times a day (BID) | ORAL | 0 refills | Status: DC

## 2019-12-13 MED ORDER — FERROUS SULFATE 325 (65 FE) MG PO TABS: 325.0000 mg | ORAL_TABLET | Freq: Two times a day (BID) | ORAL | 0 refills | Status: AC

## 2019-12-13 MED ORDER — LORATADINE 10 MG PO TABS: 10.0000 mg | ORAL_TABLET | Freq: Every day | ORAL | 0 refills | Status: DC | PRN

## 2019-12-13 MED ORDER — "INSULIN SYRINGE 29G X 1/2"" 1 ML MISC": 0 refills | Status: DC

## 2019-12-13 MED ORDER — ONETOUCH ULTRASOFT LANCETS MISC: 0 refills | Status: DC

## 2019-12-13 MED ORDER — CHLORHEXIDINE GLUCONATE CLOTH 2 % EX PADS: 6.0000 | MEDICATED_PAD | Freq: Every day | CUTANEOUS | 0 refills | Status: DC

## 2019-12-13 MED ORDER — PANTOPRAZOLE SODIUM 40 MG PO PACK: 40.0000 mg | PACK | Freq: Every day | ORAL | 0 refills | Status: DC

## 2019-12-13 MED ORDER — GLUCOSE BLOOD VI STRP: ORAL_STRIP | 0 refills | Status: DC

## 2019-12-13 MED ORDER — CLONIDINE HCL 0.2 MG PO TABS: 0.2000 mg | ORAL_TABLET | Freq: Two times a day (BID) | ORAL | 0 refills | Status: DC

## 2019-12-13 MED ORDER — HYDRALAZINE HCL 50 MG PO TABS: 100.0000 mg | ORAL_TABLET | Freq: Every day | ORAL | 0 refills | Status: DC

## 2019-12-13 NOTE — Progress Notes (Addendum)
Location:  [place] Room Number: 112-A Place of Service:  SNF (31) Provider:  Durenda Age, DNP, FNP-BC  Patient Care Team: Maren Reamer, MD (Inactive) as PCP - General (Internal Medicine)  Extended Emergency Contact Information Primary Emergency Contact: Jennette Dubin of [place] Phone: [phone] Mobile Phone: [phone] Relation: Spouse  Code Status:  FULL CODE  Goals of care: Advanced Directive information Advanced Directives 11/06/2019  Does Patient Have a Medical Advance Directive? No  Would patient like information on creating a medical advance directive? No - Patient declined     Chief Complaint  Patient presents with  . Discharge Note    Patient is seen for discharge from SNF    HPI:  Pt is a 64 y.o. female who is for discharge home with Home health PT and OT.  She was admitted to [facility] on 11/22/19 post [facility] hospitalization on 11/05/19 to 11/22/19.  She has a PMH of hypertension, diabetes mellitus, hyperlipidemia, endometrial cancer, chronic kidney disease stage IIIb and chronic diastolic heart failure.  She presented to the hospital on 11/05/2019 with weakness, fever and was found to have sepsis secondary to COVID-19 pneumonia.  She was treated with Vancomycin and Zosyn then transitioned to doxycycline. She completed Flagyl treatment for trichomonas while in the hospital.  Doxycycline treatment has now been completed.  Of note, she was recently hospitalized on 4/09 to 4/12 for acute onset and edema/diffuse anasarca with questionable concurrent heart failure exacerbation versus new medication allergic reaction (metronidazole) which was prescribed due to trichomonas noted on Pap smear.  Lasix was started.  She was discharged to SNF and finished cefazolin IV every 12 hours for MSSA bacteremia on 10/31/2019.  She was then discharged to home but was unable to take care of herself due to  generalized weakness.  While at [facility], she was treated for AKI with her creatinine up at 3.07 and GFR down 17.77. She was treated with 1/2 NS 1L, Lasix and Amlodipine were discontinued and Hydralazine was decreased from 100 mg every 8 hours to 50 mg every 8 hours. However, she started having elevated BP so Hydralazine started back to 100 mg every 8 hours. Latest creatinine at 1.99 and GFR @ 29.83, improved (12/10/19).  Patient was admitted to this facility for short-term rehabilitation after the patient's recent hospitalization.  Patient has completed SNF rehabilitation and therapy has cleared the patient for discharge.   Past Medical History:  Diagnosis Date  . Anemia 10/09/2019  . Asthma   . CKD (chronic kidney disease) stage 3, GFR 30-59 ml/min   . Dental disease   . Diabetes mellitus without complication ([place])   . Endometrial cancer ([place])   . Esophageal dysmotility   . Heart murmur   . HLD (hyperlipidemia)   . Hypertension   . MSSA bacteremia 09/2019  . Obesity, Class III, BMI 40-49.9 (morbid obesity) ([place])   . Pressure ulcer    Past Surgical History:  Procedure Laterality Date  . BIOPSY  10/08/2019   Procedure: BIOPSY;  Surgeon: Clarene Essex, MD;  Location: [facility] ENDOSCOPY;  Service: Endoscopy;;  . ESOPHAGOGASTRODUODENOSCOPY N/A 10/08/2019   Procedure: ESOPHAGOGASTRODUODENOSCOPY (EGD);  Surgeon: Clarene Essex, MD;  Location: [place];  Service: Endoscopy;  Laterality: N/A;  . TYMPANOSTOMY TUBE PLACEMENT Bilateral     Allergies  Allergen Reactions  . Ace Inhibitors     Never prescribed but she has had angioedema  . Angiotensin Receptor Blockers     ARB never  Rxed but PMH  of angioedema in March 2021    Outpatient Encounter Medications as of 12/13/2019  Medication Sig  . acetaminophen (TYLENOL) 500 MG tablet Take 500 mg by mouth every 6 (six) hours as needed for moderate pain.  . Amino Acids-Protein Hydrolys (FEEDING SUPPLEMENT, PRO-STAT SUGAR FREE 64,) LIQD Take 30 mLs by  mouth 3 (three) times daily with meals.  . benzocaine (ORAJEL) 10 % mucosal gel Use as directed 1 application in the mouth or throat 4 (four) times daily as needed for mouth pain.  . Blood Glucose Monitoring Suppl (BAYER CONTOUR MONITOR) w/Device KIT Use as directed for 3 times daily testing of blood glucose. E11.9  . carvedilol (COREG) 6.25 MG tablet Take 6.25 mg by mouth 2 (two) times daily with a meal.  . Chlorhexidine Gluconate Cloth 2 % PADS Apply 6 each topically daily.  . cloNIDine (CATAPRES) 0.2 MG tablet Take 1 tablet (0.2 mg total) by mouth 2 (two) times daily.  . feeding supplement, ENSURE ENLIVE, (ENSURE ENLIVE) LIQD Take 237 mLs by mouth 3 (three) times daily between meals.  . ferrous sulfate 325 (65 FE) MG tablet Take 325 mg by mouth 2 (two) times daily with a meal.  . glucose blood (BAYER CONTOUR TEST) test strip Use as instructed for 3 times daily testing of blood glucose. E11.9  . hydrALAZINE (APRESOLINE) 50 MG tablet Take 100 mg by mouth at bedtime.   . hydrocortisone (PROCTOZONE-HC) 2.5 % rectal cream Place 1 application rectally 2 (two) times daily as needed for hemorrhoids or anal itching.  . INSULIN SYRINGE 1CC/29G 29G X 1/2" 1 ML MISC Insulin syringes  . Lancets (ONETOUCH ULTRASOFT) lancets Use as instructed  . loratadine (CLARITIN) 10 MG tablet Take 10 mg by mouth daily as needed for allergies.  . pantoprazole sodium (PROTONIX) 40 mg/20 mL PACK Take 20 mLs (40 mg total) by mouth daily.  . polyethylene glycol (MIRALAX / GLYCOLAX) 17 g packet Take 17 g by mouth daily.   No facility-administered encounter medications on file as of 12/13/2019.    Review of Systems  GENERAL: No change in appetite, no fatigue, no weight changes, no fever, chills or weakness MOUTH and THROAT: Denies oral discomfort, gingival pain  RESPIRATORY: no cough, SOB, DOE, wheezing, hemoptysis CARDIAC: No chest pain, edema or palpitations GI: No abdominal pain, diarrhea, constipation, heart burn,  nausea or vomiting GU: Denies dysuria, frequency, hematuria, incontinence, or discharge NEUROLOGICAL: Denies dizziness, syncope, numbness, or headache PSYCHIATRIC: Denies feelings of depression or anxiety. No report of hallucinations, insomnia, paranoia, or agitation   Immunization History  Administered Date(s) Administered  . Influenza,inj,Quad PF,6+ Mos 08/03/2015, 03/24/2016  . PFIZER SARS-COV-2 Vaccination 09/12/2019  . Pneumococcal Polysaccharide-23 03/24/2016  . Tdap 08/15/2016   Pertinent  Health Maintenance Due  Topic Date Due  . MAMMOGRAM  Never done  . COLONOSCOPY  Never done  . OPHTHALMOLOGY EXAM  09/08/2017  . FOOT EXAM  11/29/2017  . DEXA SCAN  Never done  . PNA vac Low Risk Adult (1 of 2 - PCV13) 08/23/2019  . INFLUENZA VACCINE  02/09/2020  . HEMOGLOBIN A1C  03/17/2020  . PAP SMEAR-Modifier  09/19/2022   Fall Risk  12/21/2015 04/06/2014 03/10/2014  Falls in the past year? No No No     Vitals:   12/13/19 1007  BP: 110/78  Pulse: 72  Resp: 18  Temp: 98.5 F (36.9 C)  TempSrc: Oral  Weight: 198 lb (89.8 kg)  Height: 5' (1.524 m)   Body mass  index is 38.67 kg/m.  Physical Exam  GENERAL APPEARANCE: Well nourished. In no acute distress.  Morbidly obese. SKIN:  Skin is warm and dry.  MOUTH and THROAT: Lips are without lesions. Oral mucosa is moist and without lesions. Tongue is normal in shape, size, and color and without lesions. Teet with multiple caries RESPIRATORY: Breathing is even & unlabored, BS CTAB CARDIAC: RRR, no murmur,no extra heart sounds, BLE 2+edema GI: Abdomen soft, normal BS, no masses, no tenderness NEUROLOGICAL: There is no tremor. Speech is clear. Alert and oriented X 3. PSYCHIATRIC:  Affect and behavior are appropriate  Labs reviewed: Recent Labs    09/15/19 0616 09/15/19 0616 09/16/19 0139 09/17/19 0317 11/07/19 0441 11/08/19 0437 11/12/19 0519 11/12/19 0519 11/13/19 0547 11/13/19 0547 11/14/19 0259 11/15/19 0206  11/18/19 0328 11/18/19 0328 11/19/19 0255 11/19/19 0255 11/20/19 0459 11/29/19 0000 12/02/19 0000 12/05/19 0000 12/10/19 0000  NA 140   < > 142   < > 149*   < > 146*   < > 146*   < > 147*   < > 152*   < > 149*   < > 143   < > 149* 148* 146  K 2.7*   < > 3.5   < > 3.2*   < > 3.1*   < > 3.0*   < > 3.7   < > 3.5   < > 3.6   < > 3.9   < > 4.8 4.5 4.1  CL 109   < > 113*   < > 112*   < > 108   < > 110   < > 113*   < > 114*   < > 112*   < > 107   < > 109* 109* 110*  CO2 19*   < > 19*   < > 26   < > 25   < > 25   < > 25   < > 30   < > 29   < > 29   < > 21 21 23*  GLUCOSE 92   < > 126*   < > 110*   < > 130*   < > 126*   < > 138*   < > 134*  --  149*  --  166*  --   --   --   --   BUN 23   < > 27*   < > 28*   < > 42*   < > 44*   < > 48*   < > 42*   < > 38*   < > 34*   < > 70* 53* 32*  CREATININE 1.78*   < > 2.02*   < > 1.76*   < > 1.79*   < > 1.77*   < > 1.67*   < > 1.70*   < > 1.67*   < > 1.66*   < > 3.3* 2.9* 2.0*  CALCIUM 8.3*   < > 8.3*   < > 8.5*   < > 8.3*   < > 8.3*   < > 8.0*   < > 9.0   < > 8.9   < > 9.0   < > 8.3* 8.8 9.0  MG 2.1   < > 2.2  --  1.8   < > 1.7  --  1.8  --  1.8  --   --   --   --   --   --   --   --   --   --  PHOS 3.9  --  3.3  --  4.2  --   --   --   --   --   --   --   --   --   --   --   --   --   --   --   --    < > = values in this interval not displayed.   Recent Labs    11/17/19 0424 11/17/19 0424 11/18/19 0328 11/18/19 0328 11/19/19 0255 11/29/19 0000 12/05/19 0000  AST 10*   < > 11*   < > 12* 17 14  ALT 6   < > 6   < > 8 18 12   ALKPHOS 79   < > 86   < > 81 165* 202*  BILITOT 1.0  --  0.7  --  1.0  --   --   PROT 6.2*  --  5.9*  --  6.0*  --   --   ALBUMIN 3.2*   < > 3.1*   < > 2.9* 3.5 3.4*   < > = values in this interval not displayed.   Recent Labs    11/14/19 0259 11/15/19 0206 11/18/19 0328 11/18/19 0328 11/19/19 0255 11/19/19 0255 11/20/19 0459 11/20/19 0459 11/29/19 0000 12/05/19 0000 12/10/19 0000  WBC 5.0   < > 6.1   < > 7.3   < >  6.1  --  4.6 4.4 4.4  NEUTROABS 4.3  --   --   --   --   --   --   --  4  --  3  HGB 8.9*   < > 8.2*   < > 8.2*   < > 8.5*   < > 8.5* 8.8* 8.3*  HCT 29.5*   < > 27.9*   < > 27.3*   < > 28.2*   < > 26* 27* 26*  MCV 95.8   < > 98.6  --  97.8  --  97.6  --   --   --   --   PLT 101*   < > 156   < > 165   < > 190   < > 98* 118* 118*   < > = values in this interval not displayed.   Lab Results  Component Value Date   TSH 4.135 11/13/2019   Lab Results  Component Value Date   HGBA1C 5.7 (H) 09/15/2019   Lab Results  Component Value Date   CHOL 153 08/10/2015   HDL 61 08/10/2015   LDLCALC 76 08/10/2015   TRIG 82 08/10/2015   CHOLHDL 2.5 08/10/2015    Significant Diagnostic Results in last 30 days:  DG CHEST PORT 1 VIEW  Result Date: 11/16/2019 CLINICAL DATA:  Shortness of breath.  COVID-19 positive EXAM: PORTABLE CHEST 1 VIEW COMPARISON:  Nov 09, 2019 FINDINGS: Central catheter tip is in the superior vena cava. No pneumothorax. Airspace opacity is noted bilaterally, more extensive on the right than on the left. There has been slight interval clearing on the left without appreciable change on the right compared to most recent study. This opacity is primarily in a perihilar distribution bilaterally. There is a small layering right pleural effusion. There is cardiomegaly with pulmonary venous hypertension. No adenopathy. No bone lesions. IMPRESSION: Cardiomegaly with pulmonary vascular congestion. Airspace opacity is noted bilaterally, more on the right than on the left with partial clearing from the left upper lobe and left base compared to previous study.  No change on the right. Small right pleural effusion. Suspect multifocal pneumonia, although there may well be superimposed pulmonary edema. Question both pneumonia and congestive heart failure concurrent. Electronically Signed   By: Lowella Grip III M.D.   On: 11/16/2019 09:49   US Abdomen Limited RUQ  Result Date: 11/16/2019 CLINICAL  DATA:  Cirrhosis EXAM: ULTRASOUND ABDOMEN LIMITED RIGHT UPPER QUADRANT COMPARISON:  11/07/2019 FINDINGS: Gallbladder: The gallbladder wall is thickened measuring approximately 8.6 mm in thickness. The sonographic Murphy sign cannot be adequately assessed. There is gallbladder sludge. There is some mild pericholecystic free fluid. Common bile duct: Diameter: 6 mm Liver: The liver surface is nodular. There is no discrete hepatic mass. Portal vein is patent on color Doppler imaging with normal direction of blood flow towards the liver. Other: There is a small volume of ascites in the upper abdomen. There is right-sided pelviectasis. The right kidney is echogenic. IMPRESSION: 1. There is gallbladder sludge with associated gallbladder wall thickening. The sonographic Murphy sign cannot be adequately assessed. If there is high clinical suspicion for acute cholecystitis, follow-up with HIDA scan is recommended. 2. Nodular appearance of the liver which can be seen in patients with cirrhosis. There is no discrete hepatic mass. 3. Echogenic right kidney suggestive of underlying medical renal disease. 4. Right-sided pelviectasis of unknown clinical significance. This appears to be new since the patient's prior CT dated 11/07/2019 5. Small volume abdominal ascites. Electronically Signed   By: Constance Holster M.D.   On: 11/16/2019 17:14    Assessment/Plan  1. Pneumonia due to COVID-19 virus -Was treated with Vancomycin and Zosyn then transitioned to doxycycline, treatment completed.  Resolved  2. Essential hypertension - carvedilol (COREG) 6.25 MG tablet; Take 1 tablet (6.25 mg total) by mouth 2 (two) times daily with a meal.  Dispense: 60 tablet; Refill: 0 - cloNIDine (CATAPRES) 0.2 MG tablet; Take 1 tablet (0.2 mg total) by mouth 2 (two) times daily.  Dispense: 60 tablet; Refill: 0 - hydrALAZINE (APRESOLINE) 50 MG tablet; Take 2 tablets (100 mg total) by mouth at bedtime.  Dispense: 60 tablet; Refill: 0  3.  Anemia due to chronic blood loss Lab Results  Component Value Date   HGB 8.3 (A) 12/10/2019   - ferrous sulfate 325 (65 FE) MG tablet; Take 1 tablet (325 mg total) by mouth 2 (two) times daily with a meal.  Dispense: 60 tablet; Refill: 0  4. Duodenal ulcer - pantoprazole sodium (PROTONIX) 40 mg/20 mL PACK; Take 20 mLs (40 mg total) by mouth daily.  Dispense: 600 mL; Refill: 0  5. 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 E11.9  Dispense: 100 each; Refill: 0 - INSULIN SYRINGE 1CC/29G 29G X 1/2" 1 ML MISC; Insulin syringes  Dispense: 100 each; Refill: 0 - Lancets (ONETOUCH ULTRASOFT) lancets; Use as instructed  Dispense: 100 each; Refill: 0  6. Endometrial cancer [facility]) -Follow-up with GYN oncology, Dr. Jeral Pinch  7. Hypothermia, sequela -For endocrinology consult with Dr. Renato Shin  8. Edema, unspecified type - Lasix was discontinued due to AKI -Encouraged to elevate lower extremities  9. Generalized weakness -For home health PT and OT, for therapeutic strengthening exercises, fall precautions      I have filled out patient's  discharge paperwork and e-prescribed medications.  Patient will have home health PT and OT.  DME provided:  [facility] 3-in-1  Total discharge time: Greater than 30 minutes Greater than 50% was spent in counseling and coordination of care.   Discharge time involved coordination of the discharge process with social worker, nursing staff and therapy department. Medical justification for home health services/DME verified.     Durenda Age, DNP, FNP-BC [facility] [phone] (Monday-Friday 8:00 a.m. - 5:00 p.m.) [phone]  (after hours)

## 2020-01-02 ENCOUNTER — Ambulatory Visit: Payer: Medicare Other

## 2020-01-06 ENCOUNTER — Telehealth: Payer: Self-pay | Admitting: *Deleted

## 2020-01-06 NOTE — Telephone Encounter (Signed)
Called and left the patient a message to call the office back. Need to move 8/20 appt to either 8/13 or 8/16

## 2020-01-15 ENCOUNTER — Ambulatory Visit: Payer: Medicare Other | Admitting: Family Medicine

## 2020-01-27 ENCOUNTER — Other Ambulatory Visit: Payer: Self-pay | Admitting: Adult Health

## 2020-01-27 DIAGNOSIS — I10 Essential (primary) hypertension: Secondary | ICD-10-CM

## 2020-02-06 ENCOUNTER — Other Ambulatory Visit: Payer: Self-pay | Admitting: Adult Health

## 2020-02-06 DIAGNOSIS — I10 Essential (primary) hypertension: Secondary | ICD-10-CM

## 2020-02-14 ENCOUNTER — Emergency Department (HOSPITAL_COMMUNITY): Payer: Medicare HMO

## 2020-02-14 ENCOUNTER — Encounter (HOSPITAL_COMMUNITY): Payer: Self-pay

## 2020-02-14 DIAGNOSIS — Z79899 Other long term (current) drug therapy: Secondary | ICD-10-CM | POA: Insufficient documentation

## 2020-02-14 DIAGNOSIS — S4992XA Unspecified injury of left shoulder and upper arm, initial encounter: Secondary | ICD-10-CM | POA: Diagnosis present

## 2020-02-14 DIAGNOSIS — J45909 Unspecified asthma, uncomplicated: Secondary | ICD-10-CM | POA: Diagnosis not present

## 2020-02-14 DIAGNOSIS — R600 Localized edema: Secondary | ICD-10-CM | POA: Diagnosis not present

## 2020-02-14 DIAGNOSIS — W1839XA Other fall on same level, initial encounter: Secondary | ICD-10-CM | POA: Insufficient documentation

## 2020-02-14 DIAGNOSIS — Y999 Unspecified external cause status: Secondary | ICD-10-CM | POA: Insufficient documentation

## 2020-02-14 DIAGNOSIS — Y929 Unspecified place or not applicable: Secondary | ICD-10-CM | POA: Insufficient documentation

## 2020-02-14 DIAGNOSIS — Z87891 Personal history of nicotine dependence: Secondary | ICD-10-CM | POA: Insufficient documentation

## 2020-02-14 DIAGNOSIS — N183 Chronic kidney disease, stage 3 unspecified: Secondary | ICD-10-CM | POA: Diagnosis not present

## 2020-02-14 DIAGNOSIS — E669 Obesity, unspecified: Secondary | ICD-10-CM | POA: Insufficient documentation

## 2020-02-14 DIAGNOSIS — I129 Hypertensive chronic kidney disease with stage 1 through stage 4 chronic kidney disease, or unspecified chronic kidney disease: Secondary | ICD-10-CM | POA: Diagnosis not present

## 2020-02-14 DIAGNOSIS — E1122 Type 2 diabetes mellitus with diabetic chronic kidney disease: Secondary | ICD-10-CM | POA: Diagnosis not present

## 2020-02-14 DIAGNOSIS — Y939 Activity, unspecified: Secondary | ICD-10-CM | POA: Diagnosis not present

## 2020-02-14 DIAGNOSIS — Z7982 Long term (current) use of aspirin: Secondary | ICD-10-CM | POA: Diagnosis not present

## 2020-02-14 DIAGNOSIS — S42295A Other nondisplaced fracture of upper end of left humerus, initial encounter for closed fracture: Secondary | ICD-10-CM | POA: Diagnosis not present

## 2020-02-14 LAB — CBC
HCT: 29.8 % — ABNORMAL LOW (ref 36.0–46.0)
Hemoglobin: 9.2 g/dL — ABNORMAL LOW (ref 12.0–15.0)
MCH: 28.8 pg (ref 26.0–34.0)
MCHC: 30.9 g/dL (ref 30.0–36.0)
MCV: 93.4 fL (ref 80.0–100.0)
Platelets: 225 10*3/uL (ref 150–400)
RBC: 3.19 MIL/uL — ABNORMAL LOW (ref 3.87–5.11)
RDW: 19 % — ABNORMAL HIGH (ref 11.5–15.5)
WBC: 5.7 10*3/uL (ref 4.0–10.5)
nRBC: 0 % (ref 0.0–0.2)

## 2020-02-14 LAB — BASIC METABOLIC PANEL
Anion gap: 7 (ref 5–15)
BUN: 28 mg/dL — ABNORMAL HIGH (ref 8–23)
CO2: 24 mmol/L (ref 22–32)
Calcium: 8.5 mg/dL — ABNORMAL LOW (ref 8.9–10.3)
Chloride: 108 mmol/L (ref 98–111)
Creatinine, Ser: 2.14 mg/dL — ABNORMAL HIGH (ref 0.44–1.00)
GFR calc Af Amer: 27 mL/min — ABNORMAL LOW (ref >60)
GFR calc non Af Amer: 24 mL/min — ABNORMAL LOW (ref >60)
Glucose, Bld: 111 mg/dL — ABNORMAL HIGH (ref 70–99)
Potassium: 2.9 mmol/L — ABNORMAL LOW (ref 3.5–5.1)
Sodium: 139 mmol/L (ref 135–145)

## 2020-02-14 LAB — BRAIN NATRIURETIC PEPTIDE: B Natriuretic Peptide: 1926.7 pg/mL — ABNORMAL HIGH (ref 0.0–100.0)

## 2020-02-14 MED ORDER — SODIUM CHLORIDE 0.9% FLUSH: 3.0000 mL | Freq: Once | INTRAVENOUS | Status: DC

## 2020-02-14 NOTE — ED Triage Notes (Signed)
Arrived by EMS from home. Patient fell 10 days ago injuring left arm, but did not see doctor. EMS also reports bilateral pitting edema in lower extremities.

## 2020-02-15 ENCOUNTER — Emergency Department (HOSPITAL_COMMUNITY): Payer: Medicare HMO

## 2020-02-15 ENCOUNTER — Emergency Department (HOSPITAL_BASED_OUTPATIENT_CLINIC_OR_DEPARTMENT_OTHER): Payer: Medicare HMO

## 2020-02-15 ENCOUNTER — Emergency Department (HOSPITAL_COMMUNITY)
Admission: EM | Admit: 2020-02-15 | Discharge: 2020-02-15 | Disposition: A | Discharge status: Home / Self Care | Payer: Medicare HMO | Attending: Emergency Medicine | Admitting: Emergency Medicine

## 2020-02-15 DIAGNOSIS — M7989 Other specified soft tissue disorders: Secondary | ICD-10-CM

## 2020-02-15 DIAGNOSIS — S42295A Other nondisplaced fracture of upper end of left humerus, initial encounter for closed fracture: Secondary | ICD-10-CM

## 2020-02-15 MED ORDER — CLONIDINE HCL 0.1 MG PO TABS
0.2000 mg | ORAL_TABLET | Freq: Once | ORAL | Status: AC
  Administered 2020-02-15: 0.2 mg via ORAL
  Filled 2020-02-15: qty 2

## 2020-02-15 MED ORDER — POTASSIUM CHLORIDE CRYS ER 20 MEQ PO TBCR
40.0000 meq | EXTENDED_RELEASE_TABLET | Freq: Once | ORAL | Status: AC
  Administered 2020-02-15: 40 meq via ORAL
  Filled 2020-02-15: qty 2

## 2020-02-15 MED ORDER — HYDRALAZINE HCL 25 MG PO TABS
100.0000 mg | ORAL_TABLET | Freq: Once | ORAL | Status: AC
  Administered 2020-02-15: 100 mg via ORAL
  Filled 2020-02-15: qty 4

## 2020-02-15 MED ORDER — ACETAMINOPHEN 500 MG PO TABS
1000.0000 mg | ORAL_TABLET | Freq: Once | ORAL | Status: AC
  Administered 2020-02-15: 1000 mg via ORAL
  Filled 2020-02-15: qty 2

## 2020-02-15 MED ORDER — DIPHENHYDRAMINE-ZINC ACETATE 2-0.1 % EX CREA
TOPICAL_CREAM | Freq: Every day | CUTANEOUS | Status: DC | PRN
  Filled 2020-02-15: qty 28

## 2020-02-15 NOTE — ED Provider Notes (Signed)
[facility] DEPT Provider Note   CSN: [id number] Arrival date & time: 02/14/20  1916     History Chief Complaint  Patient presents with  . Arm Pain    left arm  . Leg Swelling    Vanessa [name] is a 63 y.o. female.  HPI She presents for evaluation of left arm swelling.  The swelling started after she injured her left arm in a fall, about a week ago.  She is able to get off the floor with help from EMS but did not present for evaluation.  Since then she has noted gradually worse swelling in her left arm with persistent pain.  She denies headache, neck pain, back pain.  She also has bilateral lower extremity swelling similar in the past when she was hospitalized for Covid infection and subsequently required rehab placement for stabilization.  She is also had acute renal failure, but does not have end-stage renal disease.  She has a plethora of chronic medical conditions.  She also complains of generalized itching.  She has not taken any of her medicines for at least a week because she ran out of them.  Her husband's car is in the shop so there will have a way to get to her pharmacy.  She is not currently receiving in-home health services.  There are no other known modifying factors.    Past Medical History:  Diagnosis Date  . Anemia 10/09/2019  . Asthma   . CKD (chronic kidney disease) stage 3, GFR 30-59 ml/min   . Dental disease   . Diabetes mellitus without complication ([place])   . Endometrial cancer ([place])   . Esophageal dysmotility   . Heart murmur   . HLD (hyperlipidemia)   . Hypertension   . MSSA bacteremia 09/2019  . Obesity, Class III, BMI 40-49.9 (morbid obesity) ([place])   . Pressure ulcer     Patient Active Problem List   Diagnosis Date Noted  . Neurocognitive deficits 12/11/2019  . Edema 12/04/2019  . AKI (acute kidney injury) ([place]) 12/03/2019  . Hypothermia 11/26/2019  . CKD (chronic kidney disease) stage 3, GFR 30-59 ml/min   .  Hypernatremia   . COVID-19 virus infection   . Generalized weakness   . Hypertensive urgency   . Allergic angioedema 10/19/2019  . Anasarca 10/18/2019  . Angioedema 10/18/2019  . Hypertensive emergency 10/18/2019  . Trichimoniasis 10/17/2019  . Macrocytic anemia 10/05/2019  . Esophageal dysmotility 10/04/2019  . Pressure injury of skin 09/22/2019  . Endometrial cancer ([place]) 09/21/2019  . Left humeral fracture 09/21/2019  . Iron deficiency anemia due to chronic blood loss 09/21/2019  . MSSA bacteremia 09/18/2019  . Postmenopausal bleeding 09/15/2019  . Thrombocytopenia ([place]) 09/15/2019  . Generalized pruritus 09/15/2019  . Benign hypertension with CKD (chronic kidney disease) stage III 09/15/2019  . Hyperuricemia 09/15/2019  . Hypokalemia 09/15/2019  . Symptomatic anemia 09/14/2019  . Non compliance w medication regimen 03/24/2015  . Dyslipidemia 03/24/2015  . DM type 2 (diabetes mellitus, type 2) ([place]) 05/23/2014  . HTN (hypertension) 05/23/2014    Past Surgical History:  Procedure Laterality Date  . BIOPSY  10/08/2019   Procedure: BIOPSY;  Surgeon: Clarene Essex, MD;  Location: [facility] ENDOSCOPY;  Service: Endoscopy;;  . ESOPHAGOGASTRODUODENOSCOPY N/A 10/08/2019   Procedure: ESOPHAGOGASTRODUODENOSCOPY (EGD);  Surgeon: Clarene Essex, MD;  Location: [facility];  Service: Endoscopy;  Laterality: N/A;  . TYMPANOSTOMY TUBE PLACEMENT Bilateral      OB History    Gravida  5  Para  5   Term  4   Preterm  1   AB      Living  5     SAB      TAB      Ectopic      Multiple      Live Births  5           Family History  Problem Relation Age of Onset  . Stroke Mother   . Diabetes Mother   . Hypertension Mother   . Cancer Mother        possible ovarian  . Diabetes Brother   . Hypertension Brother   . Cancer Maternal Grandmother        possible ovarian    Social History   Tobacco Use  . Smoking status: Former Research scientist (life sciences)  . Smokeless tobacco: Never Used  .  Tobacco comment: " years ago ", >9yr  Vaping Use  . Vaping Use: Never used  Substance Use Topics  . Alcohol use: Yes    Comment: occasional  . Drug use: No    Home Medications Prior to Admission medications   Medication Sig Start Date End Date Taking? Authorizing Provider  acetaminophen (TYLENOL) 500 MG tablet Take 500 mg by mouth every 6 (six) hours as needed for moderate pain.   Yes [provider]  aspirin 81 MG chewable tablet Chew 81 mg by mouth daily.   Yes [provider]  benzocaine (ORAJEL) 10 % mucosal gel Use as directed 1 application in the mouth or throat 4 (four) times daily as needed for mouth pain.   Yes [provider]  diphenhydrAMINE (BENADRYL) 25 MG tablet Take 25 mg by mouth 3 (three) times daily as needed for itching.   Yes [provider]  hydrocortisone (PROCTOZONE-HC) 2.5 % rectal cream Place 1 application rectally 2 (two) times daily as needed for hemorrhoids or anal itching.   Yes [provider]  loratadine (CLARITIN) 10 MG tablet Take 1 tablet (10 mg total) by mouth daily as needed for allergies. 12/13/19  Yes Medina-Vargas, Monina C, NP  Amino Acids-Protein Hydrolys (FEEDING SUPPLEMENT, PRO-STAT SUGAR FREE 64,) LIQD Take 30 mLs by mouth 3 (three) times daily with meals. Patient not taking: Reported on 02/15/2020 11/21/19   GJonetta Osgood MD  Blood Glucose Monitoring Suppl (BAYER CONTOUR MONITOR) w/Device KIT Use as directed for 3 times daily testing of blood glucose. E11.9 12/01/16   LMaren Reamer MD  carvedilol (COREG) 6.25 MG tablet Take 1 tablet (6.25 mg total) by mouth 2 (two) times daily with a meal. Patient not taking: Reported on 02/15/2020 12/13/19   Medina-Vargas, Monina C, NP  Chlorhexidine Gluconate Cloth 2 % PADS Apply 6 each topically daily. Patient not taking: Reported on 02/15/2020 12/13/19   Medina-Vargas, Monina C, NP  cloNIDine (CATAPRES) 0.2 MG tablet Take 1 tablet (0.2 mg total) by mouth 2 (two)  times daily. Patient not taking: Reported on 02/15/2020 12/13/19   Medina-Vargas, Monina C, NP  feeding supplement, ENSURE ENLIVE, (ENSURE ENLIVE) LIQD Take 237 mLs by mouth 3 (three) times daily between meals. Patient not taking: Reported on 02/15/2020 11/21/19   GJonetta Osgood MD  ferrous sulfate 325 (65 FE) MG tablet Take 1 tablet (325 mg total) by mouth 2 (two) times daily with a meal. Patient not taking: Reported on 02/15/2020 12/13/19   Medina-Vargas, Monina C, NP  glucose blood (BAYER CONTOUR TEST) test strip Use as instructed for 3 times daily testing of  blood glucose. E11.9 12/13/19   Medina-Vargas, Monina C, NP  hydrALAZINE (APRESOLINE) 50 MG tablet Take 2 tablets (100 mg total) by mouth at bedtime. Patient not taking: Reported on 02/15/2020 12/13/19   Medina-Vargas, Monina C, NP  INSULIN SYRINGE 1CC/29G 29G X 1/2" 1 ML MISC Insulin syringes 12/13/19   Medina-Vargas, Monina C, NP  Lancets (ONETOUCH ULTRASOFT) lancets Use as instructed 12/13/19   Medina-Vargas, Monina C, NP  pantoprazole sodium (PROTONIX) 40 mg/20 mL PACK Take 20 mLs (40 mg total) by mouth daily. Patient not taking: Reported on 02/15/2020 12/13/19   Medina-Vargas, Monina C, NP  polyethylene glycol (MIRALAX / GLYCOLAX) 17 g packet Take 17 g by mouth daily. Patient not taking: Reported on 02/15/2020 09/25/19   Arrien, Jimmy Picket, MD    Allergies    Ace inhibitors and Angiotensin receptor blockers  Review of Systems   Review of Systems  All other systems reviewed and are negative.   Physical Exam Updated Vital Signs BP (!) 165/82   Pulse 72   Temp 98.5 F (36.9 C) (Oral)   Resp 19   Ht 5' (1.524 m)   Wt 90.7 kg   LMP 03/17/2015   SpO2 98%   BMI 39.06 kg/m   Physical Exam Vitals and nursing note reviewed.  Constitutional:      General: She is not in acute distress.    Appearance: She is well-developed. She is obese. She is not ill-appearing, toxic-appearing or diaphoretic.  HENT:     Head: Normocephalic and  atraumatic.     Right Ear: External ear normal.     Left Ear: External ear normal.  Eyes:     Conjunctiva/sclera: Conjunctivae normal.     Pupils: Pupils are equal, round, and reactive to light.  Neck:     Trachea: Phonation normal.  Cardiovascular:     Rate and Rhythm: Normal rate and regular rhythm.     Heart sounds: Normal heart sounds.  Pulmonary:     Effort: Pulmonary effort is normal.     Breath sounds: Normal breath sounds.  Abdominal:     General: There is no distension.     Palpations: Abdomen is soft.     Tenderness: There is no abdominal tenderness.  Musculoskeletal:        General: Swelling present. Normal range of motion.     Cervical back: Normal range of motion and neck supple. No rigidity or tenderness.     Right lower leg: Edema present.     Left lower leg: Edema present.     Comments: Marked diffuse swelling left arm, from shoulder to fingers.  Left arm is tender humeral region without crepitation or deformity.  She guards against any movement of the left upper arm secondary to pain.  I am able to mobilize the left elbow and left wrist without significant pain.  Neurovascular tact distally in the fingers of the left hand.  Bilateral lower extremity swelling is 3-4+.  It appears chronic.  There is a localized area of swelling in the left upper lateral thigh which has a consistency of a lipoma; this is very large, greater than 15 cm.  Skin:    General: Skin is warm and dry.  Neurological:     Mental Status: She is alert and oriented to person, place, and time.     Cranial Nerves: No cranial nerve deficit.     Sensory: No sensory deficit.     Motor: No abnormal muscle tone.     Coordination:  Coordination normal.  Psychiatric:        Mood and Affect: Mood normal.        Behavior: Behavior normal.        Thought Content: Thought content normal.        Judgment: Judgment normal.     ED Results / Procedures / Treatments   Labs (all labs ordered are listed, but  only abnormal results are displayed) Labs Reviewed  BASIC METABOLIC PANEL - Abnormal; Notable for the following components:      Result Value   Potassium 2.9 (*)    Glucose, Bld 111 (*)    BUN 28 (*)    Creatinine, Ser 2.14 (*)    Calcium 8.5 (*)    GFR calc non Af Amer 24 (*)    GFR calc Af Amer 27 (*)    All other components within normal limits  CBC - Abnormal; Notable for the following components:   RBC 3.19 (*)    Hemoglobin 9.2 (*)    HCT 29.8 (*)    RDW 19.0 (*)    All other components within normal limits  BRAIN NATRIURETIC PEPTIDE - Abnormal; Notable for the following components:   B Natriuretic Peptide 1,926.7 (*)    All other components within normal limits    EKG EKG Interpretation  Date/Time:  Friday February 14 2020 19:43:26 EDT Ventricular Rate:  90 PR Interval:    QRS Duration: 87 QT Interval:  403 QTC Calculation: 494 R Axis:   4 Text Interpretation: Sinus rhythm Nonspecific T abnormalities, lateral leads Borderline prolonged QT interval No significant change since last tracing Confirmed by Calvert Cantor [phone]) on 02/15/2020 9:58:57 AM   Radiology DG Chest 2 View  Result Date: 02/14/2020 CLINICAL DATA:  Shortness of breath. Status post fall 10 days ago. EXAM: CHEST - 2 VIEW COMPARISON:  Nov 16, 2019 FINDINGS: Mildly increased bronchovascular lung markings are noted bilaterally without evidence of focal consolidation. There is a small right pleural effusion. No pneumothorax is identified. The cardiac silhouette is moderately enlarged. Multilevel degenerative changes are noted throughout the thoracic spine. A chronic fracture deformity is seen involving the proximal left humerus. IMPRESSION: 1. Mildly increased bronchovascular lung markings bilaterally without evidence of focal consolidation. 2. Small right pleural effusion. Electronically Signed   By: Virgina Norfolk M.D.   On: 02/14/2020 20:08   DG Forearm Left  Result Date: 02/15/2020 CLINICAL DATA:  Left  arm pain after fall 10 days ago. EXAM: LEFT FOREARM - 2 VIEW COMPARISON:  None. FINDINGS: There is no evidence of fracture or other focal bone lesions. Soft tissues are unremarkable. IMPRESSION: Negative. Electronically Signed   By: Aletta Edouard M.D.   On: 02/15/2020 09:57   DG Humerus Left  Result Date: 02/15/2020 CLINICAL DATA:  Patient fell 10 days ago EXAM: LEFT HUMERUS - 2+ VIEW COMPARISON:  None. FINDINGS: Fracture of the proximal humerus at the humeral metaphysis. Mild lateral angulation. Glenohumeral joint appears intact. IMPRESSION: Proximal humeral fracture. Electronically Signed   By: Suzy Bouchard M.D.   On: 02/15/2020 09:53   UE Venous Duplex ([facility] and [facility] ONLY)  Result Date: 02/15/2020 UPPER VENOUS STUDY  Indications: Swelling Other Indications: S/P fall. Comparison Study: 09-20-2019 Prior LT UEV study Performing Technologist: Darlin Coco  Examination Guidelines: A complete evaluation includes B-mode imaging, spectral Doppler, color Doppler, and power Doppler as needed of all accessible portions of each vessel. Bilateral testing is considered an integral part of a complete examination. Limited examinations for reoccurring  indications may be performed as noted.  Right Findings: +----------+------------+---------+-----------+----------+-------+ RIGHT     CompressiblePhasicitySpontaneousPropertiesSummary +----------+------------+---------+-----------+----------+-------+ Subclavian    Full       Yes       Yes                      +----------+------------+---------+-----------+----------+-------+  Left Findings: +----------+------------+---------+-----------+----------+-------+ LEFT      CompressiblePhasicitySpontaneousPropertiesSummary +----------+------------+---------+-----------+----------+-------+ IJV           Full       Yes       Yes                      +----------+------------+---------+-----------+----------+-------+ Subclavian    Full       Yes       Yes                       +----------+------------+---------+-----------+----------+-------+ Axillary      Full       Yes       Yes                      +----------+------------+---------+-----------+----------+-------+ Brachial      Full                                          +----------+------------+---------+-----------+----------+-------+ Radial        Full                                          +----------+------------+---------+-----------+----------+-------+ Ulnar         Full                                          +----------+------------+---------+-----------+----------+-------+ Cephalic      Full                                          +----------+------------+---------+-----------+----------+-------+ Basilic       Full                                          +----------+------------+---------+-----------+----------+-------+  Summary:  Right: No evidence of thrombosis in the subclavian.  Left: No evidence of deep vein thrombosis in the upper extremity. No evidence of superficial vein thrombosis in the upper extremity.  *See table(s) above for measurements and observations.    Preliminary     Procedures .Critical Care Performed by: Daleen Bo, MD Authorized by: Daleen Bo, MD   Critical care provider statement:    Critical care time (minutes):  35   Critical care start time:  02/15/2020 8:30 AM   Critical care end time:  02/15/2020 2:06 PM   Critical care time was exclusive of:  Separately billable procedures and treating other patients   Critical care was necessary to treat or prevent imminent or life-threatening deterioration of the following conditions:  Trauma   Critical care was time spent personally by me on the following  activities:  Blood draw for specimens, development of treatment plan with patient or surrogate, discussions with consultants, evaluation of patient's response to treatment, examination of patient, obtaining history from  patient or surrogate, ordering and performing treatments and interventions, ordering and review of laboratory studies, pulse oximetry, re-evaluation of patient's condition, review of old charts and ordering and review of radiographic studies   (including critical care time)  Medications Ordered in ED Medications  sodium chloride flush (NS) 0.9 % injection 3 mL (has no administration in time range)  diphenhydrAMINE-zinc acetate (BENADRYL) 2-0.1 % cream (has no administration in time range)  potassium chloride SA (KLOR-CON) CR tablet 40 mEq (40 mEq Oral Given 02/15/20 1017)  acetaminophen (TYLENOL) tablet 1,000 mg (1,000 mg Oral Given 02/15/20 1017)  hydrALAZINE (APRESOLINE) tablet 100 mg (100 mg Oral Given 02/15/20 1017)  cloNIDine (CATAPRES) tablet 0.2 mg (0.2 mg Oral Given 02/15/20 1017)    ED Course  I have reviewed the triage vital signs and the nursing notes.  Pertinent labs & imaging results that were available during my care of the patient were reviewed by me and considered in my medical decision making (see chart for details).  Clinical Course as of Feb 14 1405  Sat Feb 15, 2020  5732 Abnormal, high  Brain natriuretic peptide(!) [EW]  2025 Normal except hemoglobin low  CBC(!) [EW]  0832 Normal except potassium low, glucose high, BUN high, creatinine high, calcium low, GFR low  Basic metabolic panel(!) [EW]  4270 Transition of care team has been involved for setting up home health treatments.   [EW]  1238 Proximal humerus fracture, mildly angulated with early callus formation; interpreted by me  DG Humerus Left [EW]  1238 No fracture or dislocation, interpreted by me  DG Forearm Left [EW]  1238 No CHF or infiltrate, interpreted by me  DG Chest 2 View [EW]  6237 No evidence of DVT of the left arm.  UE Venous Duplex ([facility] ONLY) [EW]    Clinical Course User Index [EW] Daleen Bo, MD   MDM Rules/Calculators/A&P                           Patient Vitals for the past 24  hrs:  BP Temp Temp src Pulse Resp SpO2 Height Weight  02/15/20 1336 (!) 165/82 -- -- 72 19 98 % -- --  02/15/20 1213 (!) 171/86 98.5 F (36.9 C) Oral 73 16 98 % -- --  02/15/20 0833 (!) 218/119 98.5 F (36.9 C) Oral (!) 109 20 99 % -- --  02/15/20 0345 (!) 221/116 -- -- 91 16 98 % -- --  02/14/20 1935 -- -- -- -- -- 100 % -- --  02/14/20 1927 (!) 221/113 98.1 F (36.7 C) Oral 85 18 100 % 5' (1.524 m) 90.7 kg    12:42 PM Reevaluation with update and discussion. After initial assessment and treatment, an updated evaluation reveals she is currently eating and resting comfortably.  Findings discussed with the patient and all questions were answered. Daleen Bo   Medical Decision Making:  This patient is presenting for evaluation of injury to left arm with swelling, and being out of all of her medications, which does require a range of treatment options, and is a complaint that involves a high risk of morbidity and mortality. The differential diagnoses include fracture, DVT left arm.  Hypertensive urgency, metabolic instability related to being off current medications.. I decided to review old records, and  in summary injury to left arm and bilateral lower leg swelling.  She is not currently on diuretics.  She has had anasarca in the recent past, following hospitalization for Covid infection..  I did not require additional historical information from anyone.  Clinical Laboratory Tests Ordered, included CBC, Metabolic panel and BNP. Review indicates being at slightly higher than baseline.  Hemoglobin low, previously at same level.  Electrolyte panel with low potassium, high glucose, elevated BUN and calcium is a little.. Radiologic Tests Ordered, included left humerus, duplex scanning left arm, x-ray left forearm, x-ray chest.  I independently Visualized: Radiographic images, which show subacute fracture proximal humerus; no other acute abnormalities  Critical Interventions-clinical evaluation,  laboratory testing, radiographic imaging, duplex imaging left arm to rule out DVT, observation reassessment  After These Interventions, the Patient was reevaluated and was found stable for discharge.  Mild hypertension related to not taking usual medications.  Doubt hypertensive urgency.  Left humerus fracture, injured in a fall greater than 1 week ago.  Subacute injury and can be managed as an outpatient.  Patient is currently able to ambulate using her walker, despite having a humerus fracture.  I have initiated home health services for intervention and treatment.  Social work has encouraged patient to have her sister get her medications from the patient's pharmacy whether she has active refills.  Patient is agreeable to this plan.  Recommend orthopedic follow-up as an outpatient for management of humerus fracture in 1 to 2 weeks.  CRITICAL CARE- yes Performed by: Daleen Bo  Nursing Notes Reviewed/ Care Coordinated Applicable Imaging Reviewed Interpretation of Laboratory Data incorporated into ED treatment  The patient appears reasonably screened and/or stabilized for discharge and I doubt any other medical condition or other [facility] requiring further screening, evaluation, or treatment in the ED at this time prior to discharge.  Plan: Home Medications-Tylenol for pain, continue usual medications; Home Treatments-left arm sling for comfort when walking; return here if the recommended treatment, does not improve the symptoms; Recommended follow up-PCP follow-up soon as possible further management treatment.  Orthopedic follow-up 1 to 2 weeks for care of humerus fracture.  Home health for evaluation and treatment in the outpatient setting.     Final Clinical Impression(s) / ED Diagnoses Final diagnoses:  Other closed nondisplaced fracture of proximal end of left humerus, initial encounter    Rx / DC Orders ED Discharge Orders    None       Daleen Bo, MD 02/15/20 1407

## 2020-02-15 NOTE — Discharge Instructions (Addendum)
Call the orthopedic doctor for a follow-up on your left humerus fracture, next week.  See your PCP for general checkup as soon as possible.   Wear the sling in your left arm as needed for comfort when you are up.  When you are not up and walking, try to elevate your left arm above your heart is much as possible.  This will help to decrease the swelling.  For pain use Tylenol.  Try to get your prescriptions filled and start taking them as soon as possible, hopefully today.  We are having home health come out to your house tomorrow to initiate further evaluation interventions, as needed to help you manage herself in that setting.  Return here, if needed, for problems.

## 2020-02-15 NOTE — Progress Notes (Signed)
Upper extremity venous LT study completed.   Results given to provider.   See Cv Proc for preliminary results.   Darlin Coco

## 2020-02-15 NOTE — TOC Initial Note (Signed)
Transition of Care [facility]) - Initial/Assessment Note    Patient Details  Name: Vanessa [name] MRN: [id number] Date of Birth: 01-02-1956  Transition of Care [facility]) CM/SW Contact:    Joanne Chars, LCSW Phone Number: 02/15/2020, 12:51 PM  Clinical Narrative:   MD requests [facility] referral for pt who fell prior to coming to [facility].  CSW met with pt to discuss recommendation.  Pt lives at home with husband, also has adult daughter in [place].  Permission given to speak with both.  Pt was at SNF twice recently, most recently went home in June.  No current services in the home.  Pt reports she is supposed to be seeing [facility] as her PCP but has not had any appts there yet.  She just signed up on medicare in February, was receiving her medical care through the SNF, and has not had an appt since she went home in June.  Pt also requesting some assistance in obtaining meals.  MD later messaged that pt may need help obtaining medications as well.  Choice for [facility] provided, pt reports she does not have a preference.   CSW attempted to contact daughter and husband without success.                  Expected Discharge Plan: [place]     Patient Goals and CMS Choice Patient states their goals for this hospitalization and ongoing recovery are:: to be more steady CMS Medicare.gov Compare Post Acute Care list provided to:: Patient Choice offered to / list presented to : Patient  Expected Discharge Plan and Services Expected Discharge Plan: [place] Choice: [place] arrangements for the past 2 months: Single Family Home                                      Prior Living Arrangements/Services Living arrangements for the past 2 months: Single Family Home Lives with:: Spouse Patient language and need for interpreter reviewed:: Yes Do you feel safe going back to the place where you live?: Yes      Need for Family Participation  in Patient Care: No (Comment) Care giver support system in place?: Yes (comment)   Criminal Activity/Legal Involvement Pertinent to Current Situation/Hospitalization: No - Comment as needed  Activities of Daily Living      Permission Sought/Granted Permission sought to share information with : Facility Sport and exercise psychologist, Family Supports    Share Information with NAME: [place], husband.  Vernie Shanks, daughter: [phone]  Permission granted to share info w AGENCY: [facility]        Emotional Assessment Appearance:: Appears stated age Attitude/Demeanor/Rapport: Engaged Affect (typically observed): Pleasant Orientation: : Oriented to Self, Oriented to Place, Oriented to  Time, Oriented to Situation Alcohol / Substance Use: Not Applicable Psych Involvement: No (comment)  Admission diagnosis:  swelling; pain Patient Active Problem List   Diagnosis Date Noted  . Neurocognitive deficits 12/11/2019  . Edema 12/04/2019  . AKI (acute kidney injury) ([place]) 12/03/2019  . Hypothermia 11/26/2019  . CKD (chronic kidney disease) stage 3, GFR 30-59 ml/min   . Hypernatremia   . COVID-19 virus infection   . Generalized weakness   . Hypertensive urgency   . Allergic angioedema 10/19/2019  . Anasarca 10/18/2019  . Angioedema 10/18/2019  . Hypertensive emergency 10/18/2019  . Trichimoniasis 10/17/2019  .  Macrocytic anemia 10/05/2019  . Esophageal dysmotility 10/04/2019  . Pressure injury of skin 09/22/2019  . Endometrial cancer ([place]) 09/21/2019  . Left humeral fracture 09/21/2019  . Iron deficiency anemia due to chronic blood loss 09/21/2019  . MSSA bacteremia 09/18/2019  . Postmenopausal bleeding 09/15/2019  . Thrombocytopenia ([place]) 09/15/2019  . Generalized pruritus 09/15/2019  . Benign hypertension with CKD (chronic kidney disease) stage III 09/15/2019  . Hyperuricemia 09/15/2019  . Hypokalemia 09/15/2019  . Symptomatic anemia 09/14/2019  . Non compliance w medication regimen  03/24/2015  . Dyslipidemia 03/24/2015  . DM type 2 (diabetes mellitus, type 2) ([place]) 05/23/2014  . HTN (hypertension) 05/23/2014   PCP:  System, Pcp Not In Pharmacy:   [facility] [place] [address] [place] ([place]  [address] Phone: [phone] Fax: [phone]     Social Determinants of Health (SDOH) Interventions    Readmission Risk Interventions Readmission Risk Prevention Plan 11/08/2019 10/21/2019 09/25/2019  Transportation Screening Complete Complete Complete  PCP or Specialist Appt within 3-5 Days - - Not Complete  Not Complete comments - - plan for SNF  [place] or [place] - - Complete  Social Work Consult for [place] Planning/Counseling - - Complete  Palliative Care Screening - - Not Applicable  Medication Review Press photographer) Complete Referral to Pharmacy Referral to Pharmacy  PCP or Specialist appointment within 3-5 days of discharge Complete Complete -  [place] or Home Care Consult Complete (No Data) -  SW Recovery Care/Counseling Consult Complete Complete -  Palliative Care Screening Not Applicable Not Applicable -  Skilled Nursing Facility Complete Complete -  Some recent data might be hidden

## 2020-02-15 NOTE — ED Notes (Signed)
Patient up for discharge. Patient unable to go home via [place] and states no family available to pick her up. Patient states she cannot ride bus. Contacted CSW

## 2020-02-15 NOTE — TOC Transition Note (Signed)
Transition of Care [facility]) - CM/SW Discharge Note   Patient Details  Name: Vanessa [name] MRN: [id number] Date of Birth: 1956/02/24  Transition of Care [facility]) CM/SW Contact:  Joanne Chars, LCSW Phone Number: 02/15/2020, 2:49 PM   Clinical Narrative:   [place] arranged with Alvis Lemmings who reports they will see pt on 02/16/20.  Disussed with Alvis Lemmings issues with PCP and they can get pt appt with [facility] PCP.  CSW and pt attempted to contact daughter without success but were able to contact husband, who is at home to receive pt.  Discussed need to pick up prescriptions from [facility].  Husband reports daughter is at work but she can pick them up after work and deliver to pt.  Cab voucher provided for transport home.     Final next level of care: [place] Barriers to Discharge: No Barriers Identified   Patient Goals and CMS Choice Patient states their goals for this hospitalization and ongoing recovery are:: to be more steady CMS Medicare.gov Compare Post Acute Care list provided to:: Patient Choice offered to / list presented to : Patient  Discharge Placement                       Discharge Plan and Services     Post Acute Care Choice: Home Health                    HH Arranged: OT, PT, Nurse's Aide, Social Work [facility] Agency: [place] Date [place]: 02/15/20 Time [place]: 1300 Representative spoke with at [place]: [place] ([place]) Interventions     Readmission Risk Interventions Readmission Risk Prevention Plan 11/08/2019 10/21/2019 09/25/2019  Transportation Screening Complete Complete Complete  PCP or Specialist Appt within 3-5 Days - - Not Complete  Not Complete comments - - plan for SNF  [place] or [place] - - Complete  Social Work Consult for [place] Planning/Counseling - - Complete  Palliative Care Screening - - Not Applicable  Medication Review Press photographer) Complete  Referral to Pharmacy Referral to Pharmacy  PCP or Specialist appointment within 3-5 days of discharge Complete Complete -  [place] or Home Care Consult Complete (No Data) -  SW Recovery Care/Counseling Consult Complete Complete -  Palliative Care Screening Not Applicable Not Applicable -  Skilled Nursing Facility Complete Complete -  Some recent data might be hidden

## 2020-02-15 NOTE — Progress Notes (Signed)
Orthopedic Tech Progress Note Patient Details:  Vanessa [name] [birth year] [id number]  Ortho Devices Type of Ortho Device: Arm sling Ortho Device/Splint Location: left Ortho Device/Splint Interventions: Application   Post Interventions Patient Tolerated: Well Instructions Provided: Care of device   [name] 02/15/2020, 11:24 AM

## 2020-02-21 ENCOUNTER — Other Ambulatory Visit: Payer: Self-pay

## 2020-02-21 ENCOUNTER — Inpatient Hospital Stay: Payer: Medicare HMO | Attending: Gynecologic Oncology | Admitting: Gynecologic Oncology

## 2020-02-21 ENCOUNTER — Encounter: Payer: Self-pay | Admitting: Gynecologic Oncology

## 2020-02-21 ENCOUNTER — Telehealth: Payer: Self-pay | Admitting: *Deleted

## 2020-02-21 VITALS — BP 178/94 | HR 75 | Temp 98.4°F | Resp 20 | Ht 59.0 in | Wt 199.0 lb

## 2020-02-21 DIAGNOSIS — J45909 Unspecified asthma, uncomplicated: Secondary | ICD-10-CM | POA: Insufficient documentation

## 2020-02-21 DIAGNOSIS — C541 Malignant neoplasm of endometrium: Secondary | ICD-10-CM | POA: Diagnosis present

## 2020-02-21 DIAGNOSIS — Z87891 Personal history of nicotine dependence: Secondary | ICD-10-CM | POA: Diagnosis not present

## 2020-02-21 DIAGNOSIS — Z79899 Other long term (current) drug therapy: Secondary | ICD-10-CM | POA: Insufficient documentation

## 2020-02-21 DIAGNOSIS — Z8616 Personal history of COVID-19: Secondary | ICD-10-CM | POA: Insufficient documentation

## 2020-02-21 DIAGNOSIS — I129 Hypertensive chronic kidney disease with stage 1 through stage 4 chronic kidney disease, or unspecified chronic kidney disease: Secondary | ICD-10-CM | POA: Insufficient documentation

## 2020-02-21 DIAGNOSIS — E785 Hyperlipidemia, unspecified: Secondary | ICD-10-CM | POA: Diagnosis not present

## 2020-02-21 DIAGNOSIS — N183 Chronic kidney disease, stage 3 unspecified: Secondary | ICD-10-CM | POA: Diagnosis not present

## 2020-02-21 DIAGNOSIS — Z7982 Long term (current) use of aspirin: Secondary | ICD-10-CM | POA: Insufficient documentation

## 2020-02-21 DIAGNOSIS — Z6841 Body Mass Index (BMI) 40.0 and over, adult: Secondary | ICD-10-CM | POA: Diagnosis not present

## 2020-02-21 DIAGNOSIS — E1122 Type 2 diabetes mellitus with diabetic chronic kidney disease: Secondary | ICD-10-CM | POA: Diagnosis not present

## 2020-02-21 NOTE — Progress Notes (Signed)
Gynecologic Oncology Return Clinic Visit  02/21/20  Reason for Visit: Surveillance visit in the setting of hormonal treatment of grade 2 endometrioid adenocarcinoma in the setting of a patient who is not a surgical candidate  Treatment History: The patient was admitted (discharged on 09/25/19) with symptomatic anemia in the setting of PMB and was treated for MSSA bacteremia. She was started on IV cefazolin with a plan for 6 weeks of IV antibiotic therapy (EOT date 4/22). She received 3u of pRBCs, was started on Megace (improved bleeding), and EMB on 09/19/19 was performed revealing endometrial cancer, FIGO grade II, endometrioid type.  The patient was admitted again from 3/26-4/1 from her SNF secondary to anemia. She received an additional 2u pRBCs. GI was consulted and the patient underwent EGD on 3/30 with one small non-bleeding cratered duodenal ulcer noted. Biopsies negative for H Pylori. She was discharged on a PPI back to her SNF.  The patient presented again on 4/27 and was hospitalized for over 2 weeks after she presented with weakness and fever and was ultimately treated for sepsis secondary to COVID-19 pneumonia. She was discharged to a SNF on 5/13. Because of this admission, she missed an appointment with me in clinic to have her IUD placed.  She was seen on 12/02/19 and had a Mirena IUD placed at that time without difficulty.  Interval History: Patient reports overall that her bleeding has decreased.  She continues to have some bleeding daily but often just pink spotting on the toilet paper when she wipes.  She denies any heavy bleeding or passage of clots.  She denies any significant abdominal or pelvic pain, cramping.  She has been home from the long-term care facility for about 2 months.  She has there with her husband.  She is not taking a significant number of her medications because she has run out of prescriptions.  She tells me that physician is coming for a house visit tomorrow  and she thinks this may be through home health.  She endorses a good appetite without any nausea or emesis.  She reports regular bowel function now after having some diarrhea with use of MiraLAX at the facility previously.  She denies any significant urinary symptoms.  The patient was seen in the ED earlier this month, found to have a left humerus fracture with plan to manage this outpatient.  Has yet to have follow-up with orthopedics.  Past Medical/Surgical History: Past Medical History:  Diagnosis Date   Anemia 10/09/2019   Asthma    CKD (chronic kidney disease) stage 3, GFR 30-59 ml/min    Dental disease    Diabetes mellitus without complication ([place])    Endometrial cancer ([place])    Esophageal dysmotility    Heart murmur    HLD (hyperlipidemia)    Hypertension    MSSA bacteremia 09/2019   Obesity, Class III, BMI 40-49.9 (morbid obesity) ([place])    Pressure ulcer     Past Surgical History:  Procedure Laterality Date   BIOPSY  10/08/2019   Procedure: BIOPSY;  Surgeon: Clarene Essex, MD;  Location: [facility];  Service: Endoscopy;;   ESOPHAGOGASTRODUODENOSCOPY N/A 10/08/2019   Procedure: ESOPHAGOGASTRODUODENOSCOPY (EGD);  Surgeon: Clarene Essex, MD;  Location: [facility];  Service: Endoscopy;  Laterality: N/A;   TYMPANOSTOMY TUBE PLACEMENT Bilateral     Family History  Problem Relation Age of Onset   Stroke Mother    Diabetes Mother    Hypertension Mother    Cancer Mother  possible ovarian   Diabetes Brother    Hypertension Brother    Cancer Maternal Grandmother        possible ovarian    Social History   Socioeconomic History   Marital status: Married    Spouse name: Not on file   Number of children: Not on file   Years of education: Not on file   Highest education level: Not on file  Occupational History   Not on file  Tobacco Use   Smoking status: Former Smoker   Smokeless tobacco: Never Used   Tobacco comment: " years ago  ", >65yr  Vaping Use   Vaping Use: Never used  Substance and Sexual Activity   Alcohol use: Yes    Comment: occasional   Drug use: No   Sexual activity: Not Currently  Other Topics Concern   Not on file  Social History Narrative   Not on file   Social Determinants of Health   Financial Resource Strain:    Difficulty of Paying Living Expenses:   Food Insecurity:    Worried About RCharity fundraiserin the Last Year:    RArboriculturistin the Last Year:   Transportation Needs:    LFilm/video editor(Medical):    Lack of Transportation (Non-Medical):   Physical Activity:    Days of Exercise per Week:    Minutes of Exercise per Session:   Stress:    Feeling of Stress :   Social Connections:    Frequency of Communication with Friends and Family:    Frequency of Social Gatherings with Friends and Family:    Attends Religious Services:    Active Member of Clubs or Organizations:    Attends CMusic therapist    Marital Status:     Current Medications:  Current Outpatient Medications:    acetaminophen (TYLENOL) 500 MG tablet, Take 500 mg by mouth every 6 (six) hours as needed for moderate pain., Disp: , Rfl:    Amino Acids-Protein Hydrolys (FEEDING SUPPLEMENT, PRO-STAT SUGAR FREE 64,) LIQD, Take 30 mLs by mouth 3 (three) times daily with meals. (Patient not taking: Reported on 02/15/2020), Disp: 887 mL, Rfl: 0   aspirin 81 MG chewable tablet, Chew 81 mg by mouth daily., Disp: , Rfl:    benzocaine (ORAJEL) 10 % mucosal gel, Use as directed 1 application in the mouth or throat 4 (four) times daily as needed for mouth pain., Disp: , Rfl:    Blood Glucose Monitoring Suppl (BAYER CONTOUR MONITOR) w/Device KIT, Use as directed for 3 times daily testing of blood glucose. E11.9, Disp: 1 kit, Rfl: 0   carvedilol (COREG) 6.25 MG tablet, Take 1 tablet (6.25 mg total) by mouth 2 (two) times daily with a meal. (Patient not taking: Reported on  02/15/2020), Disp: 60 tablet, Rfl: 0   Chlorhexidine Gluconate Cloth 2 % PADS, Apply 6 each topically daily. (Patient not taking: Reported on 02/15/2020), Disp: 180 each, Rfl: 0   cloNIDine (CATAPRES) 0.2 MG tablet, Take 1 tablet (0.2 mg total) by mouth 2 (two) times daily. (Patient not taking: Reported on 02/15/2020), Disp: 60 tablet, Rfl: 0   diphenhydrAMINE (BENADRYL) 25 MG tablet, Take 25 mg by mouth 3 (three) times daily as needed for itching., Disp: , Rfl:    feeding supplement, ENSURE ENLIVE, (ENSURE ENLIVE) LIQD, Take 237 mLs by mouth 3 (three) times daily between meals. (Patient not taking: Reported on 02/15/2020), Disp: 237 mL, Rfl: 12   ferrous sulfate 325 (  65 FE) MG tablet, Take 1 tablet (325 mg total) by mouth 2 (two) times daily with a meal. (Patient not taking: Reported on 02/15/2020), Disp: 60 tablet, Rfl: 0   glucose blood (BAYER CONTOUR TEST) test strip, Use as instructed for 3 times daily testing of blood glucose. E11.9, Disp: 100 each, Rfl: 0   hydrALAZINE (APRESOLINE) 50 MG tablet, Take 2 tablets (100 mg total) by mouth at bedtime. (Patient not taking: Reported on 02/15/2020), Disp: 60 tablet, Rfl: 0   hydrocortisone (PROCTOZONE-HC) 2.5 % rectal cream, Place 1 application rectally 2 (two) times daily as needed for hemorrhoids or anal itching., Disp: , Rfl:    INSULIN SYRINGE 1CC/29G 29G X 1/2" 1 ML MISC, Insulin syringes, Disp: 100 each, Rfl: 0   Lancets (ONETOUCH ULTRASOFT) lancets, Use as instructed, Disp: 100 each, Rfl: 0   loratadine (CLARITIN) 10 MG tablet, Take 1 tablet (10 mg total) by mouth daily as needed for allergies., Disp: 30 tablet, Rfl: 0   pantoprazole sodium (PROTONIX) 40 mg/20 mL PACK, Take 20 mLs (40 mg total) by mouth daily. (Patient not taking: Reported on 02/15/2020), Disp: 600 mL, Rfl: 0   polyethylene glycol (MIRALAX / GLYCOLAX) 17 g packet, Take 17 g by mouth daily. (Patient not taking: Reported on 02/15/2020), Disp: 14 each, Rfl: 0  Review of  Systems: Pertinent positives as per HPI. Denies appetite changes, fevers, chills, fatigue, unexplained weight changes. Denies hearing loss, neck lumps or masses, mouth sores, ringing in ears or voice changes. Denies cough or wheezing.   Denies chest pain or palpitations.  Denies abdominal distention, pain, blood in stools, constipation, diarrhea, nausea, vomiting, or early satiety. Denies pain with intercourse, dysuria, frequency, hematuria or incontinence. Denies hot flashes, pelvic pain or vaginal discharge.   Denies joint pain, back pain or muscle pain/cramps. Denies dizziness, headaches, numbness or seizures. Denies swollen lymph nodes or glands, denies easy bruising or bleeding. Denies anxiety, depression, confusion, or decreased concentration.  Physical Exam: BP (!) 178/94 Comment: RN notified   Pulse 75    Temp 98.4 F (36.9 C) (Oral)    Resp 20    Ht 4' 11"  (1.499 m)    Wt 199 lb (90.3 kg)    LMP 03/17/2015    BMI 40.19 kg/m  General: Alert, oriented, no acute distress. HEENT: Normocephalic, atraumatic, sclera anicteric. Extremities: Limited range of motion of lower extremities.  Holds left arm still across her chest.  Extremities are warm and well perfused.  She has significant bilateral lower extremity edema with multiple sores on each leg, 1 of which is bleeding.   GU: Normal appearing external genitalia without erythema, excoriation, or lesions.  Speculum exam reveals mildly atrophic vaginal mucosa.  Cervix is normal-appearing without lesions other than what appears to be a nabothian cyst.  IUD strings visible approximately 3-4 cm protruding through the os.  Endometrial Biopsy  The procedure was explained.  The speculum was placed and the cervix was prepped with betadine. The endometrial biopsy was performed with 2 passes of the endometrial biopsy pipelle. Moderate amount tissue was obtained. The uterus sounded to just under 8cm.  The patient tolerated procedure well.    Laboratory & Radiologic Studies: None new  Assessment & Plan: Vanessa [name] is a 64 y.o. woman with clinical Stage 1 grade 2 endometrial cancer who is not a surgical candidate and who is undergoing treatment with progesterone therapy (Mirena IUD).   Patient overall reports improved vaginal bleeding and is otherwise asymptomatic from a endometrial  cancer standpoint.  Repeat biopsy performed today.  This is after approximately 5 months of hormonal therapy, first with Megace (although unclear to me if she was actually taking this) followed by a Mirena IUD for the last 3 months.  I will call her next week with the biopsy results.  She continues not to be a surgical candidate.  I will plan to see her again in 3 months for repeat biopsy, even if the biopsy today shows persistent grade 2 endometrial cancer.  My office offered to attempt calling the orthopedic clinic to see if we could help facilitate getting the patient follow-up for her humerus fracture.   20 minutes of total time was spent for this patient encounter, including preparation, face-to-face counseling with the patient and coordination of care, and documentation of the encounter.  Jeral Pinch, MD  Division of Gynecologic Oncology  Department of Obstetrics and Gynecology  [facility]

## 2020-02-21 NOTE — Telephone Encounter (Signed)
Faxed radiology report and ER notes to [facility]

## 2020-02-21 NOTE — Patient Instructions (Signed)
I will call you next week with your biopsy results. I will see you back in 3 months. If anything changes (bleeding increases, you develop pelvic pain, etc), lease call me sooner at [phone].

## 2020-02-24 ENCOUNTER — Other Ambulatory Visit: Payer: Self-pay

## 2020-02-28 ENCOUNTER — Ambulatory Visit: Payer: Medicare Other | Admitting: Gynecologic Oncology

## 2020-02-28 ENCOUNTER — Telehealth: Payer: Self-pay | Admitting: Gynecologic Oncology

## 2020-02-28 LAB — SURGICAL PATHOLOGY

## 2020-02-28 NOTE — Telephone Encounter (Signed)
Called the patient to discuss biopsy results from this week. Unfortunately, her biopsy shows grade 3 endometrial cancer (prior biopsy showed grade 2 cancer). This was aafter a couple of months of oral progesterone and 3 months of a Mirena IUD for treatment given that she is a poor surgical candidate. I'm planning to discuss her case at tumor board on Monday - will discuss whether she would be a candidate for pelvic radiation.  Jeral Pinch MD Gynecologic Oncology

## 2020-03-02 ENCOUNTER — Telehealth: Payer: Self-pay | Admitting: Oncology

## 2020-03-02 ENCOUNTER — Other Ambulatory Visit: Payer: Self-pay | Admitting: Oncology

## 2020-03-02 DIAGNOSIS — C541 Malignant neoplasm of endometrium: Secondary | ICD-10-CM

## 2020-03-02 NOTE — Telephone Encounter (Signed)
Called [place] and discussed recommendations from GYN Conference from this morning.  She verbalized agreement and has an appointment to see Dr. Sondra Come on 03/30/20.

## 2020-03-02 NOTE — Progress Notes (Signed)
Gynecologic Oncology Multi-Disciplinary Disposition Conference Note  Date of the Conference: 03/02/2020  Patient Name: Vanessa [name]  Primary GYN Oncologist: Dr. Berline Lopes  Stage/Disposition:  Stage 1, grade 3 endometrioid adenocarcinoma. Disposition is to radiation with Heyman's capsules and external beam radiation and Heyman's capsules.   This Multidisciplinary conference took place involving physicians from [place], [place], Radiation Oncology, Pathology, Radiology along with the Gynecologic Oncology Nurse Practitioner and RN.  Comprehensive assessment of the patient's malignancy, staging, need for surgery, chemotherapy, radiation therapy, and need for further testing were reviewed. Supportive measures, both inpatient and following discharge were also discussed. The recommended plan of care is documented. Greater than 35 minutes were spent correlating and coordinating this patient's care.

## 2020-03-03 ENCOUNTER — Telehealth: Payer: Self-pay | Admitting: Oncology

## 2020-03-03 NOTE — Telephone Encounter (Signed)
Vanessa [name] and rescheduled her appointment with Dr. Sondra Come to 03/05/20 (2:30 nurse eval, 3:00 consult).  Also discussed recommendation for genetic counseling and scheduled it for 03/19/20 at 2:00.

## 2020-03-05 ENCOUNTER — Ambulatory Visit
Admission: RE | Admit: 2020-03-05 | Discharge: 2020-03-05 | Disposition: A | Discharge status: Home / Self Care | Payer: Medicare HMO | Source: Ambulatory Visit | Attending: Radiation Oncology | Admitting: Radiation Oncology

## 2020-03-05 ENCOUNTER — Ambulatory Visit: Payer: Medicare HMO

## 2020-03-05 ENCOUNTER — Telehealth: Payer: Self-pay

## 2020-03-05 NOTE — Telephone Encounter (Signed)
LVM on pt's cell phone to ensure she is coming to her 3 pm appointment. Telephone number provided if she has any questions.

## 2020-03-09 ENCOUNTER — Other Ambulatory Visit: Payer: Self-pay | Admitting: Adult Health

## 2020-03-09 DIAGNOSIS — I10 Essential (primary) hypertension: Secondary | ICD-10-CM

## 2020-03-10 ENCOUNTER — Telehealth: Payer: Self-pay | Admitting: Oncology

## 2020-03-10 NOTE — Telephone Encounter (Signed)
Attempted to call [facility] x2.  There is no answer or voicemail.  Will continue to try calling.

## 2020-03-11 NOTE — Telephone Encounter (Signed)
Attempted to call [place] again with no answer.  Left a message for her husband as well.

## 2020-03-12 ENCOUNTER — Telehealth: Payer: Self-pay | Admitting: General Practice

## 2020-03-12 NOTE — Telephone Encounter (Signed)
[place] CSW Progress Notes  Call from [facility].  She has just come from home visit to initiate services w patient.  HH was started after patient presented to ED w a recent fall.  Per CSW, both patient and husband suffer from dementia.  They are struggling to live independently but have thus far refused placement.  Patient reports she has an appt at [facility] on 9/10 (CSW does not see this in Epic), has missed prior appointment due to lack of transportation.  [place] CSW would like patient's appts clarified and patient enrolled in [facility].  [place] nurse navigator and CSW [place] alerted to the need and asked to connect w [place], [place] Worker Phone:  [phone] Cell:  [phone]

## 2020-03-17 ENCOUNTER — Telehealth: Payer: Self-pay | Admitting: Oncology

## 2020-03-17 NOTE — Telephone Encounter (Signed)
Called [place] and went over upcoming appointments for genetic counseling and for Dr. Sondra Come.  We discussed setting her up for the [employer] transportation program and a referral will be sent.

## 2020-03-18 ENCOUNTER — Emergency Department (HOSPITAL_COMMUNITY)
Admission: EM | Admit: 2020-03-18 | Discharge: 2020-03-19 | Disposition: A | Discharge status: Home / Self Care | Payer: Medicare HMO | Attending: Emergency Medicine | Admitting: Emergency Medicine

## 2020-03-18 ENCOUNTER — Other Ambulatory Visit: Payer: Self-pay

## 2020-03-18 ENCOUNTER — Encounter (HOSPITAL_COMMUNITY): Payer: Self-pay

## 2020-03-18 DIAGNOSIS — J45909 Unspecified asthma, uncomplicated: Secondary | ICD-10-CM | POA: Insufficient documentation

## 2020-03-18 DIAGNOSIS — N183 Chronic kidney disease, stage 3 unspecified: Secondary | ICD-10-CM | POA: Diagnosis not present

## 2020-03-18 DIAGNOSIS — Z87891 Personal history of nicotine dependence: Secondary | ICD-10-CM | POA: Insufficient documentation

## 2020-03-18 DIAGNOSIS — E119 Type 2 diabetes mellitus without complications: Secondary | ICD-10-CM | POA: Diagnosis not present

## 2020-03-18 DIAGNOSIS — Z79899 Other long term (current) drug therapy: Secondary | ICD-10-CM | POA: Insufficient documentation

## 2020-03-18 DIAGNOSIS — Z7982 Long term (current) use of aspirin: Secondary | ICD-10-CM | POA: Diagnosis not present

## 2020-03-18 DIAGNOSIS — I129 Hypertensive chronic kidney disease with stage 1 through stage 4 chronic kidney disease, or unspecified chronic kidney disease: Secondary | ICD-10-CM | POA: Insufficient documentation

## 2020-03-18 DIAGNOSIS — I10 Essential (primary) hypertension: Secondary | ICD-10-CM

## 2020-03-18 LAB — BASIC METABOLIC PANEL
Anion gap: 7 (ref 5–15)
BUN: 24 mg/dL — ABNORMAL HIGH (ref 8–23)
CO2: 23 mmol/L (ref 22–32)
Calcium: 8.5 mg/dL — ABNORMAL LOW (ref 8.9–10.3)
Chloride: 112 mmol/L — ABNORMAL HIGH (ref 98–111)
Creatinine, Ser: 2 mg/dL — ABNORMAL HIGH (ref 0.44–1.00)
GFR calc Af Amer: 30 mL/min — ABNORMAL LOW (ref >60)
GFR calc non Af Amer: 26 mL/min — ABNORMAL LOW (ref >60)
Glucose, Bld: 132 mg/dL — ABNORMAL HIGH (ref 70–99)
Potassium: 3.8 mmol/L (ref 3.5–5.1)
Sodium: 142 mmol/L (ref 135–145)

## 2020-03-18 LAB — CBC
HCT: 29.6 % — ABNORMAL LOW (ref 36.0–46.0)
Hemoglobin: 8.8 g/dL — ABNORMAL LOW (ref 12.0–15.0)
MCH: 27.2 pg (ref 26.0–34.0)
MCHC: 29.7 g/dL — ABNORMAL LOW (ref 30.0–36.0)
MCV: 91.4 fL (ref 80.0–100.0)
Platelets: 169 10*3/uL (ref 150–400)
RBC: 3.24 MIL/uL — ABNORMAL LOW (ref 3.87–5.11)
RDW: 17.5 % — ABNORMAL HIGH (ref 11.5–15.5)
WBC: 3.8 10*3/uL — ABNORMAL LOW (ref 4.0–10.5)
nRBC: 0 % (ref 0.0–0.2)

## 2020-03-18 NOTE — ED Triage Notes (Signed)
Patient arrived by Vanessa [name] for HTN. Patient states that the physical therapist wanted her BP addressed but patient has no complaints. Patient alert and oriented, states when she was in nursing home she took her meds 3 times per day and now only x 1. BP found to be in 220s

## 2020-03-19 ENCOUNTER — Inpatient Hospital Stay: Payer: Medicare HMO | Admitting: Genetic Counselor

## 2020-03-19 MED ORDER — CLONIDINE HCL 0.2 MG PO TABS
0.2000 mg | ORAL_TABLET | Freq: Two times a day (BID) | ORAL | Status: DC
  Administered 2020-03-19: 0.2 mg via ORAL
  Filled 2020-03-19: qty 1

## 2020-03-19 MED ORDER — HYDRALAZINE HCL 25 MG PO TABS
100.0000 mg | ORAL_TABLET | Freq: Every day | ORAL | Status: DC
  Administered 2020-03-19: 100 mg via ORAL
  Filled 2020-03-19: qty 4

## 2020-03-19 MED ORDER — CARVEDILOL 3.125 MG PO TABS
6.2500 mg | ORAL_TABLET | Freq: Two times a day (BID) | ORAL | Status: DC
  Administered 2020-03-19: 6.25 mg via ORAL
  Filled 2020-03-19: qty 2

## 2020-03-19 MED ORDER — HYDRALAZINE HCL 50 MG PO TABS: 100.0000 mg | ORAL_TABLET | Freq: Every day | ORAL | 0 refills | Status: DC

## 2020-03-19 NOTE — ED Notes (Signed)
Patient verbalizes understanding of discharge instructions. Opportunity for questioning and answers were provided. Armband removed by staff, pt discharged from ED stable & ambulatory utilizing walker. This RN is arranging taxi for pt to tranport home

## 2020-03-19 NOTE — ED Provider Notes (Signed)
[facility] EMERGENCY DEPARTMENT Provider Note   CSN: [id number] Arrival date & time: 03/18/20  1248     History No chief complaint on file.   Vanessa [name] is a 64 y.o. female.   64 year old female with history of diabetes, hypertension, hyperlipidemia, CKD, obesity, endometrial cancer presents to the emergency department for evaluation of hypertension.  She had her blood pressure checked by her physical therapist today and was advised to seek evaluation in the ED after her blood pressure was 578 systolic.  She denies any associated symptoms of her hypertension.  Specifically denies blurry vision, vision loss, headache, lightheadedness, syncope or near syncope, chest pain, shortness of breath.  Has been taking 2 tablets of Coreg and 2 tablets of Clonidine in the AM, but none of her medication at night. She has also been out of her hydralazine x 1-2 weeks.       Past Medical History:  Diagnosis Date  . Anemia 10/09/2019  . Asthma   . CKD (chronic kidney disease) stage 3, GFR 30-59 ml/min   . Dental disease   . Diabetes mellitus without complication ([place])   . Endometrial cancer ([employer])   . Esophageal dysmotility   . Heart murmur   . HLD (hyperlipidemia)   . Hypertension   . MSSA bacteremia 09/2019  . Obesity, Class III, BMI 40-49.9 (morbid obesity) ([place])   . Pressure ulcer     Patient Active Problem List   Diagnosis Date Noted  . Neurocognitive deficits 12/11/2019  . Edema 12/04/2019  . AKI (acute kidney injury) ([place]) 12/03/2019  . Hypothermia 11/26/2019  . CKD (chronic kidney disease) stage 3, GFR 30-59 ml/min   . Hypernatremia   . COVID-19 virus infection   . Generalized weakness   . Hypertensive urgency   . Allergic angioedema 10/19/2019  . Anasarca 10/18/2019  . Angioedema 10/18/2019  . Hypertensive emergency 10/18/2019  . Trichimoniasis 10/17/2019  . Macrocytic anemia 10/05/2019  . Esophageal dysmotility 10/04/2019  . Pressure injury of  skin 09/22/2019  . Endometrial cancer ([place]) 09/21/2019  . Left humeral fracture 09/21/2019  . Iron deficiency anemia due to chronic blood loss 09/21/2019  . MSSA bacteremia 09/18/2019  . Postmenopausal bleeding 09/15/2019  . Thrombocytopenia ([place]) 09/15/2019  . Generalized pruritus 09/15/2019  . Benign hypertension with CKD (chronic kidney disease) stage III 09/15/2019  . Hyperuricemia 09/15/2019  . Hypokalemia 09/15/2019  . Symptomatic anemia 09/14/2019  . Non compliance w medication regimen 03/24/2015  . Dyslipidemia 03/24/2015  . DM type 2 (diabetes mellitus, type 2) ([place]) 05/23/2014  . HTN (hypertension) 05/23/2014    Past Surgical History:  Procedure Laterality Date  . BIOPSY  10/08/2019   Procedure: BIOPSY;  Surgeon: Clarene Essex, MD;  Location: [facility] ENDOSCOPY;  Service: Endoscopy;;  . ESOPHAGOGASTRODUODENOSCOPY N/A 10/08/2019   Procedure: ESOPHAGOGASTRODUODENOSCOPY (EGD);  Surgeon: Clarene Essex, MD;  Location: [facility];  Service: Endoscopy;  Laterality: N/A;  . TYMPANOSTOMY TUBE PLACEMENT Bilateral      OB History    Gravida  5   Para  5   Term  4   Preterm  1   AB      Living  5     SAB      TAB      Ectopic      Multiple      Live Births  5           Family History  Problem Relation Age of Onset  . Stroke Mother   .  Diabetes Mother   . Hypertension Mother   . Cancer Mother        possible ovarian  . Diabetes Brother   . Hypertension Brother   . Cancer Maternal Grandmother        possible ovarian    Social History   Tobacco Use  . Smoking status: Former Research scientist (life sciences)  . Smokeless tobacco: Never Used  . Tobacco comment: " years ago ", >44yrs  Vaping Use  . Vaping Use: Never used  Substance Use Topics  . Alcohol use: Yes    Comment: occasional  . Drug use: No    Home Medications Prior to Admission medications   Medication Sig Start Date End Date Taking? Authorizing Provider  acetaminophen (TYLENOL) 500 MG tablet Take 500 mg by mouth  every 6 (six) hours as needed for moderate pain.    [provider]  Amino Acids-Protein Hydrolys (FEEDING SUPPLEMENT, PRO-STAT SUGAR FREE 64,) LIQD Take 30 mLs by mouth 3 (three) times daily with meals. Patient not taking: Reported on 02/15/2020 11/21/19   Jonetta Osgood, MD  aspirin 81 MG chewable tablet Chew 81 mg by mouth daily.    [provider]  benzocaine (ORAJEL) 10 % mucosal gel Use as directed 1 application in the mouth or throat 4 (four) times daily as needed for mouth pain.    [provider]  Blood Glucose Monitoring Suppl (BAYER CONTOUR MONITOR) w/Device KIT Use as directed for 3 times daily testing of blood glucose. E11.9 12/01/16   Maren Reamer, MD  carvedilol (COREG) 6.25 MG tablet Take 1 tablet (6.25 mg total) by mouth 2 (two) times daily with a meal. Patient not taking: Reported on 02/15/2020 12/13/19   Medina-Vargas, Monina C, NP  Chlorhexidine Gluconate Cloth 2 % PADS Apply 6 each topically daily. Patient not taking: Reported on 02/15/2020 12/13/19   Medina-Vargas, Monina C, NP  cloNIDine (CATAPRES) 0.2 MG tablet Take 1 tablet (0.2 mg total) by mouth 2 (two) times daily. Patient not taking: Reported on 02/15/2020 12/13/19   Medina-Vargas, Monina C, NP  diphenhydrAMINE (BENADRYL) 25 MG tablet Take 25 mg by mouth 3 (three) times daily as needed for itching.    [provider]  feeding supplement, ENSURE ENLIVE, (ENSURE ENLIVE) LIQD Take 237 mLs by mouth 3 (three) times daily between meals. Patient not taking: Reported on 02/15/2020 11/21/19   Jonetta Osgood, MD  ferrous sulfate 325 (65 FE) MG tablet Take 1 tablet (325 mg total) by mouth 2 (two) times daily with a meal. Patient not taking: Reported on 02/15/2020 12/13/19   Medina-Vargas, Monina C, NP  glucose blood (BAYER CONTOUR TEST) test strip Use as instructed for 3 times daily testing of blood glucose. E11.9 12/13/19   Medina-Vargas, Monina C, NP  hydrALAZINE (APRESOLINE) 50 MG tablet Take 2 tablets  (100 mg total) by mouth at bedtime. 03/19/20   Antonietta Breach, PA-C  hydrocortisone (PROCTOZONE-HC) 2.5 % rectal cream Place 1 application rectally 2 (two) times daily as needed for hemorrhoids or anal itching.    [provider]  INSULIN SYRINGE 1CC/29G 29G X 1/2" 1 ML MISC Insulin syringes 12/13/19   Medina-Vargas, Monina C, NP  Lancets (ONETOUCH ULTRASOFT) lancets Use as instructed 12/13/19   Medina-Vargas, Monina C, NP  loratadine (CLARITIN) 10 MG tablet Take 1 tablet (10 mg total) by mouth daily as needed for allergies. 12/13/19   Medina-Vargas, Monina C, NP  pantoprazole sodium (PROTONIX) 40 mg/20 mL PACK Take 20 mLs (40 mg total) by  mouth daily. Patient not taking: Reported on 02/15/2020 12/13/19   Medina-Vargas, Monina C, NP  polyethylene glycol (MIRALAX / GLYCOLAX) 17 g packet Take 17 g by mouth daily. Patient not taking: Reported on 02/15/2020 09/25/19   Arrien, Jimmy Picket, MD    Allergies    Ace inhibitors and Angiotensin receptor blockers  Review of Systems   Review of Systems  Ten systems reviewed and are negative for acute change, except as noted in the HPI.    Physical Exam Updated Vital Signs BP 140/90 (BP Location: Left Arm)   Pulse 77   Temp 98.2 F (36.8 C) (Oral)   Resp 19   LMP 03/17/2015   SpO2 96%   Physical Exam Vitals and nursing note reviewed.  Constitutional:      General: She is not in acute distress.    Appearance: She is well-developed. She is not diaphoretic.     Comments: Nontoxic appearing, pleasant.  HENT:     Head: Normocephalic and atraumatic.  Eyes:     General: No scleral icterus.    Conjunctiva/sclera: Conjunctivae normal.  Pulmonary:     Effort: Pulmonary effort is normal. No respiratory distress.     Comments: Respirations even and unlabored Musculoskeletal:        General: Normal range of motion.     Cervical back: Normal range of motion.  Skin:    General: Skin is warm and dry.     Coloration: Skin is not pale.     Findings: No  erythema or rash.  Neurological:     Mental Status: She is alert and oriented to person, place, and time.  Psychiatric:        Behavior: Behavior normal.     ED Results / Procedures / Treatments   Labs (all labs ordered are listed, but only abnormal results are displayed) Labs Reviewed  BASIC METABOLIC PANEL - Abnormal; Notable for the following components:      Result Value   Chloride 112 (*)    Glucose, Bld 132 (*)    BUN 24 (*)    Creatinine, Ser 2.00 (*)    Calcium 8.5 (*)    GFR calc non Af Amer 26 (*)    GFR calc Af Amer 30 (*)    All other components within normal limits  CBC - Abnormal; Notable for the following components:   WBC 3.8 (*)    RBC 3.24 (*)    Hemoglobin 8.8 (*)    HCT 29.6 (*)    MCHC 29.7 (*)    RDW 17.5 (*)    All other components within normal limits    EKG EKG Interpretation  Date/Time:  Wednesday March 18 2020 12:53:59 EDT Ventricular Rate:  60 PR Interval:  180 QRS Duration: 88 QT Interval:  426 QTC Calculation: 426 R Axis:   63 Text Interpretation: Normal sinus rhythm Possible Anterior infarct , age undetermined Abnormal ECG No significant change since last tracing Confirmed by Thayer Jew [phone]) on 03/19/2020 12:56:36 AM   Radiology No results found.  Procedures Procedures (including critical care time)  Medications Ordered in ED Medications  carvedilol (COREG) tablet 6.25 mg (6.25 mg Oral Given 03/19/20 0149)  cloNIDine (CATAPRES) tablet 0.2 mg (0.2 mg Oral Given 03/19/20 0149)  hydrALAZINE (APRESOLINE) tablet 100 mg (100 mg Oral Given 03/19/20 0149)    ED Course  I have reviewed the triage vital signs and the nursing notes.  Pertinent labs & imaging results that were available during my care of  the patient were reviewed by me and considered in my medical decision making (see chart for details).    MDM Rules/Calculators/A&P                          64 year old female presents to the emergency department for evaluation  of hypertension.  She is asymptomatic with regard to her hypertension, but was advised to seek evaluation by her physical therapist.  Upon further discussion with the patient, it was discovered that she has been taking her blood pressure medication incorrectly.  She also did not receive a refill of her hydralazine which she takes at bedtime.  Patient given doses of her hydralazine, Catapres, Coreg while in the ED.  This has appropriately improved the patient's blood pressure.  Her labs are at baseline without signs of acute endorgan damage.  Have encouraged follow-up with her primary care doctor within the week for blood pressure recheck.  Return precautions discussed and provided. Patient discharged in stable condition with no unaddressed concerns.   Final Clinical Impression(s) / ED Diagnoses Final diagnoses:  Hypertension not at goal    Rx / DC Orders ED Discharge Orders         Ordered    hydrALAZINE (APRESOLINE) 50 MG tablet  Daily at bedtime        03/19/20 0445           Antonietta Breach, PA-C 03/19/20 0539    Horton, Barbette Hair, MD 03/19/20 [phone]

## 2020-03-19 NOTE — Discharge Instructions (Addendum)
Take 1 tablet of your Coreg in the morning and 1 tablet in the evening. Take 1 tablet of your Catapress in the morning and 1 tablet in the evening. Take 2 tablets of Hydralazine at bedtime. Your Hydralazine has been refilled.  Have your blood pressure rechecked by your primary care doctor within the week.  Return for new or concerning symptoms such as chest pain, shortness of breath, loss of consciousness, headache, vision changes or loss.

## 2020-03-30 ENCOUNTER — Ambulatory Visit: Payer: Medicare HMO

## 2020-03-30 ENCOUNTER — Ambulatory Visit: Payer: Medicare HMO | Admitting: Radiation Oncology

## 2020-03-31 ENCOUNTER — Telehealth: Payer: Self-pay | Admitting: Oncology

## 2020-03-31 NOTE — Telephone Encounter (Signed)
[facility] [employer] and they will call patient regarding setting up a ride for tomorrow's appointments.

## 2020-04-01 ENCOUNTER — Telehealth: Payer: Self-pay | Admitting: Oncology

## 2020-04-01 ENCOUNTER — Ambulatory Visit: Payer: Medicare HMO

## 2020-04-01 ENCOUNTER — Telehealth: Payer: Self-pay | Admitting: Radiation Oncology

## 2020-04-01 ENCOUNTER — Other Ambulatory Visit: Payer: Medicare HMO

## 2020-04-01 ENCOUNTER — Ambulatory Visit
Admission: RE | Admit: 2020-04-01 | Discharge: 2020-04-01 | Disposition: A | Discharge status: Home / Self Care | Payer: Medicare HMO | Source: Ambulatory Visit | Attending: Radiation Oncology | Admitting: Radiation Oncology

## 2020-04-01 NOTE — Telephone Encounter (Signed)
Called pt to r/s today's appointment. Per Elmo Putt, pt stated she wanted to r/s due to the rain and her feet swelling. Unable to reach pt. LVM.

## 2020-04-01 NOTE — Telephone Encounter (Signed)
Called Gerrie to see if she would be able to move her appointment up 30 minutes earlier today.  She said she was trying to call to reschedule the appointment to a different day because of the rain.  Advised her that [facility] scheduling will call her to reschedule.  Also called [facility] [employer] department and canceled her ride for this afternoon.

## 2020-04-06 ENCOUNTER — Inpatient Hospital Stay: Payer: Medicare HMO | Attending: Gynecologic Oncology | Admitting: Genetic Counselor

## 2020-04-06 ENCOUNTER — Inpatient Hospital Stay: Payer: Medicare HMO

## 2020-04-09 ENCOUNTER — Ambulatory Visit: Payer: Medicare Other | Admitting: Gastroenterology

## 2020-04-21 ENCOUNTER — Telehealth: Payer: Self-pay | Admitting: Oncology

## 2020-04-21 NOTE — Telephone Encounter (Signed)
Vanessa [name] about her appointment with Dr. Sondra Come tomorrow.  She said she needs assistance getting in to the car for transportation.  Called Transportation and they will set up a door to door ride for patient.

## 2020-04-22 ENCOUNTER — Other Ambulatory Visit: Payer: Self-pay

## 2020-04-22 ENCOUNTER — Encounter (HOSPITAL_COMMUNITY): Payer: Self-pay | Admitting: Emergency Medicine

## 2020-04-22 ENCOUNTER — Observation Stay (HOSPITAL_BASED_OUTPATIENT_CLINIC_OR_DEPARTMENT_OTHER): Payer: Medicare Other

## 2020-04-22 ENCOUNTER — Ambulatory Visit
Admission: RE | Admit: 2020-04-22 | Discharge: 2020-04-22 | Disposition: A | Discharge status: Home / Self Care | Payer: Medicare Other | Source: Ambulatory Visit | Attending: Radiation Oncology | Admitting: Radiation Oncology

## 2020-04-22 ENCOUNTER — Emergency Department (HOSPITAL_COMMUNITY): Payer: Medicare Other

## 2020-04-22 ENCOUNTER — Ambulatory Visit: Payer: Medicare Other

## 2020-04-22 ENCOUNTER — Telehealth: Payer: Self-pay | Admitting: Radiation Oncology

## 2020-04-22 ENCOUNTER — Inpatient Hospital Stay (HOSPITAL_COMMUNITY)
Admission: EM | Admit: 2020-04-22 | Discharge: 2020-04-30 | DRG: 603 | Disposition: A | Discharge status: Skilled Nursing Facility | Payer: Medicare Other | Source: Other Acute Inpatient Hospital | Attending: Internal Medicine | Admitting: Internal Medicine

## 2020-04-22 DIAGNOSIS — L0390 Cellulitis, unspecified: Secondary | ICD-10-CM | POA: Diagnosis present

## 2020-04-22 DIAGNOSIS — Z833 Family history of diabetes mellitus: Secondary | ICD-10-CM

## 2020-04-22 DIAGNOSIS — C541 Malignant neoplasm of endometrium: Secondary | ICD-10-CM | POA: Diagnosis present

## 2020-04-22 DIAGNOSIS — E11622 Type 2 diabetes mellitus with other skin ulcer: Secondary | ICD-10-CM | POA: Diagnosis present

## 2020-04-22 DIAGNOSIS — Z888 Allergy status to other drugs, medicaments and biological substances status: Secondary | ICD-10-CM

## 2020-04-22 DIAGNOSIS — I129 Hypertensive chronic kidney disease with stage 1 through stage 4 chronic kidney disease, or unspecified chronic kidney disease: Secondary | ICD-10-CM | POA: Diagnosis not present

## 2020-04-22 DIAGNOSIS — Z6841 Body Mass Index (BMI) 40.0 and over, adult: Secondary | ICD-10-CM

## 2020-04-22 DIAGNOSIS — L03115 Cellulitis of right lower limb: Secondary | ICD-10-CM | POA: Diagnosis not present

## 2020-04-22 DIAGNOSIS — D500 Iron deficiency anemia secondary to blood loss (chronic): Secondary | ICD-10-CM | POA: Diagnosis present

## 2020-04-22 DIAGNOSIS — D631 Anemia in chronic kidney disease: Secondary | ICD-10-CM | POA: Diagnosis present

## 2020-04-22 DIAGNOSIS — L03119 Cellulitis of unspecified part of limb: Secondary | ICD-10-CM

## 2020-04-22 DIAGNOSIS — L03116 Cellulitis of left lower limb: Principal | ICD-10-CM

## 2020-04-22 DIAGNOSIS — E119 Type 2 diabetes mellitus without complications: Secondary | ICD-10-CM

## 2020-04-22 DIAGNOSIS — E876 Hypokalemia: Secondary | ICD-10-CM | POA: Diagnosis present

## 2020-04-22 DIAGNOSIS — Z79899 Other long term (current) drug therapy: Secondary | ICD-10-CM

## 2020-04-22 DIAGNOSIS — I10 Essential (primary) hypertension: Secondary | ICD-10-CM

## 2020-04-22 DIAGNOSIS — N179 Acute kidney failure, unspecified: Secondary | ICD-10-CM

## 2020-04-22 DIAGNOSIS — E1122 Type 2 diabetes mellitus with diabetic chronic kidney disease: Secondary | ICD-10-CM | POA: Diagnosis not present

## 2020-04-22 DIAGNOSIS — Z20822 Contact with and (suspected) exposure to covid-19: Secondary | ICD-10-CM | POA: Diagnosis present

## 2020-04-22 DIAGNOSIS — Z8249 Family history of ischemic heart disease and other diseases of the circulatory system: Secondary | ICD-10-CM

## 2020-04-22 DIAGNOSIS — E872 Acidosis: Secondary | ICD-10-CM | POA: Diagnosis present

## 2020-04-22 DIAGNOSIS — Z9114 Patient's other noncompliance with medication regimen: Secondary | ICD-10-CM

## 2020-04-22 DIAGNOSIS — N183 Chronic kidney disease, stage 3 unspecified: Secondary | ICD-10-CM

## 2020-04-22 DIAGNOSIS — Z87891 Personal history of nicotine dependence: Secondary | ICD-10-CM

## 2020-04-22 DIAGNOSIS — I739 Peripheral vascular disease, unspecified: Secondary | ICD-10-CM | POA: Diagnosis not present

## 2020-04-22 DIAGNOSIS — Z794 Long term (current) use of insulin: Secondary | ICD-10-CM

## 2020-04-22 DIAGNOSIS — Z7982 Long term (current) use of aspirin: Secondary | ICD-10-CM

## 2020-04-22 DIAGNOSIS — Z9119 Patient's noncompliance with other medical treatment and regimen: Secondary | ICD-10-CM

## 2020-04-22 DIAGNOSIS — Z8616 Personal history of COVID-19: Secondary | ICD-10-CM

## 2020-04-22 DIAGNOSIS — R3129 Other microscopic hematuria: Secondary | ICD-10-CM | POA: Diagnosis not present

## 2020-04-22 DIAGNOSIS — N1832 Chronic kidney disease, stage 3b: Secondary | ICD-10-CM

## 2020-04-22 DIAGNOSIS — J45909 Unspecified asthma, uncomplicated: Secondary | ICD-10-CM | POA: Diagnosis present

## 2020-04-22 DIAGNOSIS — L02419 Cutaneous abscess of limb, unspecified: Secondary | ICD-10-CM

## 2020-04-22 DIAGNOSIS — E785 Hyperlipidemia, unspecified: Secondary | ICD-10-CM | POA: Diagnosis present

## 2020-04-22 DIAGNOSIS — N184 Chronic kidney disease, stage 4 (severe): Secondary | ICD-10-CM | POA: Diagnosis present

## 2020-04-22 DIAGNOSIS — L98499 Non-pressure chronic ulcer of skin of other sites with unspecified severity: Secondary | ICD-10-CM | POA: Diagnosis present

## 2020-04-22 HISTORY — DX: Cutaneous abscess of limb, unspecified: L02.419

## 2020-04-22 HISTORY — DX: Cellulitis of unspecified part of limb: L03.119

## 2020-04-22 LAB — CBC WITH DIFFERENTIAL/PLATELET
Abs Immature Granulocytes: 0.01 10*3/uL (ref 0.00–0.07)
Basophils Absolute: 0.1 10*3/uL (ref 0.0–0.1)
Basophils Relative: 1 %
Eosinophils Absolute: 0.2 10*3/uL (ref 0.0–0.5)
Eosinophils Relative: 4 %
HCT: 27.9 % — ABNORMAL LOW (ref 36.0–46.0)
Hemoglobin: 8.6 g/dL — ABNORMAL LOW (ref 12.0–15.0)
Immature Granulocytes: 0 %
Lymphocytes Relative: 14 %
Lymphs Abs: 0.6 10*3/uL — ABNORMAL LOW (ref 0.7–4.0)
MCH: 27.7 pg (ref 26.0–34.0)
MCHC: 30.8 g/dL (ref 30.0–36.0)
MCV: 90 fL (ref 80.0–100.0)
Monocytes Absolute: 0.3 10*3/uL (ref 0.1–1.0)
Monocytes Relative: 8 %
Neutro Abs: 2.9 10*3/uL (ref 1.7–7.7)
Neutrophils Relative %: 73 %
Platelets: 232 10*3/uL (ref 150–400)
RBC: 3.1 MIL/uL — ABNORMAL LOW (ref 3.87–5.11)
RDW: 17 % — ABNORMAL HIGH (ref 11.5–15.5)
WBC: 4.1 10*3/uL (ref 4.0–10.5)
nRBC: 0 % (ref 0.0–0.2)

## 2020-04-22 LAB — LACTIC ACID, PLASMA
Lactic Acid, Venous: 0.8 mmol/L (ref 0.5–1.9)
Lactic Acid, Venous: 1 mmol/L (ref 0.5–1.9)

## 2020-04-22 LAB — COMPREHENSIVE METABOLIC PANEL
ALT: 13 U/L (ref 0–44)
AST: 14 U/L — ABNORMAL LOW (ref 15–41)
Albumin: 2.5 g/dL — ABNORMAL LOW (ref 3.5–5.0)
Alkaline Phosphatase: 95 U/L (ref 38–126)
Anion gap: 7 (ref 5–15)
BUN: 28 mg/dL — ABNORMAL HIGH (ref 8–23)
CO2: 23 mmol/L (ref 22–32)
Calcium: 8.2 mg/dL — ABNORMAL LOW (ref 8.9–10.3)
Chloride: 112 mmol/L — ABNORMAL HIGH (ref 98–111)
Creatinine, Ser: 2.2 mg/dL — ABNORMAL HIGH (ref 0.44–1.00)
GFR, Estimated: 23 mL/min — ABNORMAL LOW (ref >60)
Glucose, Bld: 131 mg/dL — ABNORMAL HIGH (ref 70–99)
Potassium: 3.2 mmol/L — ABNORMAL LOW (ref 3.5–5.1)
Sodium: 142 mmol/L (ref 135–145)
Total Bilirubin: 0.3 mg/dL (ref 0.3–1.2)
Total Protein: 6.7 g/dL (ref 6.5–8.1)

## 2020-04-22 LAB — HIV ANTIBODY (ROUTINE TESTING W REFLEX): HIV Screen 4th Generation wRfx: NONREACTIVE

## 2020-04-22 LAB — GLUCOSE, CAPILLARY
Glucose-Capillary: 147 mg/dL — ABNORMAL HIGH (ref 70–99)
Glucose-Capillary: 166 mg/dL — ABNORMAL HIGH (ref 70–99)
Glucose-Capillary: 158 mg/dL — ABNORMAL HIGH (ref 70–99)

## 2020-04-22 LAB — CBG MONITORING, ED: Glucose-Capillary: 130 mg/dL — ABNORMAL HIGH (ref 70–99)

## 2020-04-22 LAB — PREALBUMIN: Prealbumin: 10.7 mg/dL — ABNORMAL LOW (ref 18–38)

## 2020-04-22 LAB — SEDIMENTATION RATE: Sed Rate: 46 mm/hr — ABNORMAL HIGH (ref 0–22)

## 2020-04-22 LAB — RESPIRATORY PANEL BY RT PCR (FLU A&B, COVID)
Influenza A by PCR: NEGATIVE
Influenza B by PCR: NEGATIVE
SARS Coronavirus 2 by RT PCR: NEGATIVE

## 2020-04-22 LAB — C-REACTIVE PROTEIN: CRP: 1.3 mg/dL — ABNORMAL HIGH (ref <1.0)

## 2020-04-22 MED ORDER — ENOXAPARIN SODIUM 30 MG/0.3ML ~~LOC~~ SOLN: 30.0000 mg | SUBCUTANEOUS | Status: DC

## 2020-04-22 MED ORDER — JUVEN PO PACK
1.0000 | PACK | Freq: Two times a day (BID) | ORAL | Status: DC
  Administered 2020-04-24 – 2020-04-30 (×12): 1 via ORAL
  Filled 2020-04-22 (×14): qty 1

## 2020-04-22 MED ORDER — ACETAMINOPHEN 650 MG RE SUPP: 650.0000 mg | Freq: Four times a day (QID) | RECTAL | Status: DC | PRN

## 2020-04-22 MED ORDER — ONDANSETRON HCL 4 MG PO TABS: 4.0000 mg | ORAL_TABLET | Freq: Four times a day (QID) | ORAL | Status: DC | PRN

## 2020-04-22 MED ORDER — ACETAMINOPHEN 325 MG PO TABS
650.0000 mg | ORAL_TABLET | Freq: Four times a day (QID) | ORAL | Status: DC | PRN
  Administered 2020-04-25 – 2020-04-30 (×11): 650 mg via ORAL
  Filled 2020-04-22 (×11): qty 2

## 2020-04-22 MED ORDER — VANCOMYCIN HCL 1500 MG/300ML IV SOLN
1500.0000 mg | Freq: Once | INTRAVENOUS | Status: AC
  Administered 2020-04-22: 1500 mg via INTRAVENOUS
  Filled 2020-04-22: qty 300

## 2020-04-22 MED ORDER — ASPIRIN 81 MG PO CHEW
81.0000 mg | CHEWABLE_TABLET | Freq: Every day | ORAL | Status: DC
  Administered 2020-04-22 – 2020-04-30 (×9): 81 mg via ORAL
  Filled 2020-04-22 (×9): qty 1

## 2020-04-22 MED ORDER — HEPARIN SODIUM (PORCINE) 5000 UNIT/ML IJ SOLN
5000.0000 [IU] | Freq: Three times a day (TID) | INTRAMUSCULAR | Status: DC
  Administered 2020-04-22 – 2020-04-30 (×24): 5000 [IU] via SUBCUTANEOUS
  Filled 2020-04-22 (×24): qty 1

## 2020-04-22 MED ORDER — HYDRALAZINE HCL 20 MG/ML IJ SOLN: 5.0000 mg | Freq: Once | INTRAMUSCULAR | Status: DC

## 2020-04-22 MED ORDER — HYDRALAZINE HCL 50 MG PO TABS
100.0000 mg | ORAL_TABLET | Freq: Once | ORAL | Status: AC
  Administered 2020-04-22: 100 mg via ORAL
  Filled 2020-04-22: qty 2

## 2020-04-22 MED ORDER — METRONIDAZOLE IN NACL 5-0.79 MG/ML-% IV SOLN
500.0000 mg | Freq: Three times a day (TID) | INTRAVENOUS | Status: DC
  Administered 2020-04-22 – 2020-04-29 (×22): 500 mg via INTRAVENOUS
  Filled 2020-04-22 (×22): qty 100

## 2020-04-22 MED ORDER — ADULT MULTIVITAMIN W/MINERALS CH
1.0000 | ORAL_TABLET | Freq: Every day | ORAL | Status: DC
  Administered 2020-04-23 – 2020-04-30 (×8): 1 via ORAL
  Filled 2020-04-22 (×8): qty 1

## 2020-04-22 MED ORDER — ONDANSETRON HCL 4 MG/2ML IJ SOLN: 4.0000 mg | Freq: Four times a day (QID) | INTRAMUSCULAR | Status: DC | PRN

## 2020-04-22 MED ORDER — INSULIN ASPART 100 UNIT/ML ~~LOC~~ SOLN
0.0000 [IU] | Freq: Three times a day (TID) | SUBCUTANEOUS | Status: DC
  Administered 2020-04-22 – 2020-04-30 (×17): 1 [IU] via SUBCUTANEOUS

## 2020-04-22 MED ORDER — CLONIDINE HCL 0.2 MG PO TABS: 0.2000 mg | ORAL_TABLET | Freq: Once | ORAL | Status: DC

## 2020-04-22 MED ORDER — PIPERACILLIN-TAZOBACTAM 3.375 G IVPB 30 MIN
3.3750 g | Freq: Once | INTRAVENOUS | Status: AC
  Administered 2020-04-22: 3.375 g via INTRAVENOUS
  Filled 2020-04-22: qty 50

## 2020-04-22 MED ORDER — HYDRALAZINE HCL 25 MG PO TABS
100.0000 mg | ORAL_TABLET | Freq: Once | ORAL | Status: AC
  Administered 2020-04-22: 100 mg via ORAL
  Filled 2020-04-22: qty 4

## 2020-04-22 MED ORDER — CARVEDILOL 6.25 MG PO TABS
6.2500 mg | ORAL_TABLET | Freq: Two times a day (BID) | ORAL | Status: DC
  Administered 2020-04-22 – 2020-04-30 (×17): 6.25 mg via ORAL
  Filled 2020-04-22 (×17): qty 1

## 2020-04-22 MED ORDER — POTASSIUM CHLORIDE CRYS ER 20 MEQ PO TBCR
40.0000 meq | EXTENDED_RELEASE_TABLET | Freq: Once | ORAL | Status: AC
  Administered 2020-04-22: 40 meq via ORAL
  Filled 2020-04-22: qty 2

## 2020-04-22 MED ORDER — LABETALOL HCL 5 MG/ML IV SOLN
10.0000 mg | INTRAVENOUS | Status: DC | PRN
  Administered 2020-04-22: 20 mg via INTRAVENOUS
  Administered 2020-04-22: 10 mg via INTRAVENOUS
  Administered 2020-04-23: 20 mg via INTRAVENOUS
  Filled 2020-04-22 (×3): qty 4

## 2020-04-22 MED ORDER — ENSURE MAX PROTEIN PO LIQD
11.0000 [oz_av] | Freq: Every day | ORAL | Status: DC
  Administered 2020-04-22 – 2020-04-29 (×8): 11 [oz_av] via ORAL
  Filled 2020-04-22 (×10): qty 330

## 2020-04-22 MED ORDER — VANCOMYCIN HCL IN DEXTROSE 1-5 GM/200ML-% IV SOLN
1000.0000 mg | INTRAVENOUS | Status: DC
  Administered 2020-04-23: 1000 mg via INTRAVENOUS
  Filled 2020-04-22 (×2): qty 200

## 2020-04-22 MED ORDER — INSULIN ASPART 100 UNIT/ML ~~LOC~~ SOLN: 0.0000 [IU] | Freq: Every day | SUBCUTANEOUS | Status: DC

## 2020-04-22 MED ORDER — HYDROCERIN EX CREA
TOPICAL_CREAM | Freq: Every day | CUTANEOUS | Status: DC
  Administered 2020-04-30: 1 via TOPICAL
  Filled 2020-04-22 (×2): qty 113

## 2020-04-22 MED ORDER — JUVEN PO PACK: 1.0000 | PACK | Freq: Two times a day (BID) | ORAL | Status: DC

## 2020-04-22 MED ORDER — SODIUM CHLORIDE 0.9 % IV SOLN
2.0000 g | INTRAVENOUS | Status: DC
  Administered 2020-04-22 – 2020-04-28 (×7): 2 g via INTRAVENOUS
  Filled 2020-04-22: qty 2
  Filled 2020-04-22 (×2): qty 20
  Filled 2020-04-22 (×3): qty 2
  Filled 2020-04-22: qty 20
  Filled 2020-04-22: qty 2

## 2020-04-22 MED ORDER — CLONIDINE HCL 0.2 MG PO TABS
0.2000 mg | ORAL_TABLET | Freq: Two times a day (BID) | ORAL | Status: DC
  Administered 2020-04-22 – 2020-04-30 (×17): 0.2 mg via ORAL
  Filled 2020-04-22 (×17): qty 1

## 2020-04-22 MED ORDER — CLONIDINE HCL 0.2 MG PO TABS
0.2000 mg | ORAL_TABLET | Freq: Once | ORAL | Status: AC
  Administered 2020-04-22: 0.2 mg via ORAL
  Filled 2020-04-22: qty 1

## 2020-04-22 NOTE — ED Provider Notes (Signed)
[facility] EMERGENCY DEPARTMENT Provider Note   CSN: [id number] Arrival date & time: 04/22/20  0033     History Chief Complaint  Patient presents with   Leg Wounds    Vanessa [name] is a 64 y.o. female with a history of T2DM, hpertension, hyperlipidemia, CKD, asthma, MSSA bacteremia, & endometrial cancer who presents to the ED from home for evaluation of acutely worsening LE wounds x 1 week. Patient states that she has had problems with bilateral lower extremity wounds for some time now, was in rehab previously with significant improvement in wounds, has recently been at home at her apartment with a wound care nurse coming out once per week. She states over the past 1 week the wounds have significantly worsened, her legs have become red, more swollen, more painful, and she has had purulent drainage from areas at home. No alleviating/aggravating factors. She denies fever, chills, nausea, vomiting, chest pain, shortness of breath, or abdominal pain. She has not had her nighttime blood pressure medications.  HPI     Past Medical History:  Diagnosis Date   Anemia 10/09/2019   Asthma    CKD (chronic kidney disease) stage 3, GFR 30-59 ml/min (HCC)    Dental disease    Diabetes mellitus without complication ([place])    Endometrial cancer ([place])    Esophageal dysmotility    Heart murmur    HLD (hyperlipidemia)    Hypertension    MSSA bacteremia 09/2019   Obesity, Class III, BMI 40-49.9 (morbid obesity) ([place])    Pressure ulcer     Patient Active Problem List   Diagnosis Date Noted   Neurocognitive deficits 12/11/2019   Edema 12/04/2019   AKI (acute kidney injury) ([place]) 12/03/2019   Hypothermia 11/26/2019   CKD (chronic kidney disease) stage 3, GFR 30-59 ml/min (HCC)    Hypernatremia    COVID-19 virus infection    Generalized weakness    Hypertensive urgency    Allergic angioedema 10/19/2019   Anasarca 10/18/2019   Angioedema  10/18/2019   Hypertensive emergency 10/18/2019   Trichimoniasis 10/17/2019   Macrocytic anemia 10/05/2019   Esophageal dysmotility 10/04/2019   Pressure injury of skin 09/22/2019   Endometrial cancer ([place]) 09/21/2019   Left humeral fracture 09/21/2019   Iron deficiency anemia due to chronic blood loss 09/21/2019   MSSA bacteremia 09/18/2019   Postmenopausal bleeding 09/15/2019   Thrombocytopenia ([place]) 09/15/2019   Generalized pruritus 09/15/2019   Benign hypertension with CKD (chronic kidney disease) stage III ([place]) 09/15/2019   Hyperuricemia 09/15/2019   Hypokalemia 09/15/2019   Symptomatic anemia 09/14/2019   Non compliance w medication regimen 03/24/2015   Dyslipidemia 03/24/2015   DM type 2 (diabetes mellitus, type 2) ([place]) 05/23/2014   HTN (hypertension) 05/23/2014    Past Surgical History:  Procedure Laterality Date   BIOPSY  10/08/2019   Procedure: BIOPSY;  Surgeon: Clarene Essex, MD;  Location: [facility];  Service: Endoscopy;;   ESOPHAGOGASTRODUODENOSCOPY N/A 10/08/2019   Procedure: ESOPHAGOGASTRODUODENOSCOPY (EGD);  Surgeon: Clarene Essex, MD;  Location: [facility];  Service: Endoscopy;  Laterality: N/A;   TYMPANOSTOMY TUBE PLACEMENT Bilateral      OB History    Gravida  5   Para  5   Term  4   Preterm  1   AB      Living  5     SAB      TAB      Ectopic      Multiple  Live Births  5           Family History  Problem Relation Age of Onset   Stroke Mother    Diabetes Mother    Hypertension Mother    Cancer Mother        possible ovarian   Diabetes Brother    Hypertension Brother    Cancer Maternal Grandmother        possible ovarian    Social History   Tobacco Use   Smoking status: Former Smoker   Smokeless tobacco: Never Used   Tobacco comment: " years ago ", >32yr  Vaping Use   Vaping Use: Never used  Substance Use Topics   Alcohol use: Yes    Comment: occasional   Drug use: No     Home Medications Prior to Admission medications   Medication Sig Start Date End Date Taking? Authorizing Provider  acetaminophen (TYLENOL) 500 MG tablet Take 500 mg by mouth every 6 (six) hours as needed for moderate pain.    [provider]  Amino Acids-Protein Hydrolys (FEEDING SUPPLEMENT, PRO-STAT SUGAR FREE 64,) LIQD Take 30 mLs by mouth 3 (three) times daily with meals. Patient not taking: Reported on 02/15/2020 11/21/19   GJonetta Osgood MD  aspirin 81 MG chewable tablet Chew 81 mg by mouth daily.    [provider]  benzocaine (ORAJEL) 10 % mucosal gel Use as directed 1 application in the mouth or throat 4 (four) times daily as needed for mouth pain.    [provider]  Blood Glucose Monitoring Suppl (BAYER CONTOUR MONITOR) w/Device KIT Use as directed for 3 times daily testing of blood glucose. E11.9 12/01/16   LMaren Reamer MD  carvedilol (COREG) 6.25 MG tablet Take 1 tablet (6.25 mg total) by mouth 2 (two) times daily with a meal. Patient not taking: Reported on 02/15/2020 12/13/19   Medina-Vargas, Monina C, NP  Chlorhexidine Gluconate Cloth 2 % PADS Apply 6 each topically daily. Patient not taking: Reported on 02/15/2020 12/13/19   Medina-Vargas, Monina C, NP  cloNIDine (CATAPRES) 0.2 MG tablet Take 1 tablet (0.2 mg total) by mouth 2 (two) times daily. Patient not taking: Reported on 02/15/2020 12/13/19   Medina-Vargas, Monina C, NP  diphenhydrAMINE (BENADRYL) 25 MG tablet Take 25 mg by mouth 3 (three) times daily as needed for itching.    [provider]  feeding supplement, ENSURE ENLIVE, (ENSURE ENLIVE) LIQD Take 237 mLs by mouth 3 (three) times daily between meals. Patient not taking: Reported on 02/15/2020 11/21/19   GJonetta Osgood MD  ferrous sulfate 325 (65 FE) MG tablet Take 1 tablet (325 mg total) by mouth 2 (two) times daily with a meal. Patient not taking: Reported on 02/15/2020 12/13/19   Medina-Vargas, Monina C, NP  glucose blood (BAYER  CONTOUR TEST) test strip Use as instructed for 3 times daily testing of blood glucose. E11.9 12/13/19   Medina-Vargas, Monina C, NP  hydrALAZINE (APRESOLINE) 50 MG tablet Take 2 tablets (100 mg total) by mouth at bedtime. 03/19/20   HAntonietta Breach PA-C  hydrocortisone (PROCTOZONE-HC) 2.5 % rectal cream Place 1 application rectally 2 (two) times daily as needed for hemorrhoids or anal itching.    [provider]  INSULIN SYRINGE 1CC/29G 29G X 1/2" 1 ML MISC Insulin syringes 12/13/19   Medina-Vargas, Monina C, NP  Lancets (ONETOUCH ULTRASOFT) lancets Use as instructed 12/13/19   Medina-Vargas, Monina C, NP  loratadine (CLARITIN) 10 MG tablet Take 1 tablet (10 mg total)  by mouth daily as needed for allergies. 12/13/19   Medina-Vargas, Monina C, NP  pantoprazole sodium (PROTONIX) 40 mg/20 mL PACK Take 20 mLs (40 mg total) by mouth daily. Patient not taking: Reported on 02/15/2020 12/13/19   Medina-Vargas, Monina C, NP  polyethylene glycol (MIRALAX / GLYCOLAX) 17 g packet Take 17 g by mouth daily. Patient not taking: Reported on 02/15/2020 09/25/19   Arrien, Jimmy Picket, MD    Allergies    Ace inhibitors and Angiotensin receptor blockers  Review of Systems   Review of Systems  Constitutional: Negative for chills and fever.  Eyes: Negative for visual disturbance.  Respiratory: Negative for shortness of breath.   Cardiovascular: Positive for leg swelling. Negative for chest pain.  Gastrointestinal: Negative for abdominal pain, nausea and vomiting.  Musculoskeletal: Positive for myalgias.  Skin: Positive for color change and wound.  Neurological: Negative for weakness, numbness and headaches.  All other systems reviewed and are negative.   Physical Exam Updated Vital Signs BP (!) 243/107 (BP Location: Right Arm)    Pulse 81    Temp 98.1 F (36.7 C) (Oral)    LMP 03/17/2015    SpO2 100%   Physical Exam Vitals and nursing note reviewed.  Constitutional:      General: She is not in acute  distress.    Appearance: She is well-developed. She is obese. She is not toxic-appearing.  HENT:     Head: Normocephalic and atraumatic.  Eyes:     General:        Right eye: No discharge.        Left eye: No discharge.     Conjunctiva/sclera: Conjunctivae normal.  Cardiovascular:     Rate and Rhythm: Normal rate and regular rhythm.     Comments: 2+ DP pulses bilaterally. Pulmonary:     Effort: Pulmonary effort is normal. No respiratory distress.     Breath sounds: Normal breath sounds. No wheezing, rhonchi or rales.  Abdominal:     Palpations: Abdomen is soft.     Tenderness: There is no abdominal tenderness. There is no guarding.  Musculoskeletal:     Cervical back: Neck supple.     Comments: Patient has bilateral lower extremity swelling with erythema and warmth. She has multiple areas of malodorous wounds & skin maceration. Diffusely tender. Pictured below.  Skin:    General: Skin is warm and dry.  Neurological:     Mental Status: She is alert.     Comments: Clear speech.   Psychiatric:        Behavior: Behavior normal.               ED Results / Procedures / Treatments   Labs (all labs ordered are listed, but only abnormal results are displayed) Labs Reviewed  COMPREHENSIVE METABOLIC PANEL - Abnormal; Notable for the following components:      Result Value   Potassium 3.2 (*)    Chloride 112 (*)    Glucose, Bld 131 (*)    BUN 28 (*)    Creatinine, Ser 2.20 (*)    Calcium 8.2 (*)    Albumin 2.5 (*)    AST 14 (*)    GFR, Estimated 23 (*)    All other components within normal limits  CBC WITH DIFFERENTIAL/PLATELET - Abnormal; Notable for the following components:   RBC 3.10 (*)    Hemoglobin 8.6 (*)    HCT 27.9 (*)    RDW 17.0 (*)    Lymphs Abs 0.6 (*)  All other components within normal limits  CULTURE, BLOOD (ROUTINE X 2)  CULTURE, BLOOD (ROUTINE X 2)  LACTIC ACID, PLASMA  LACTIC ACID, PLASMA  URINALYSIS, ROUTINE W REFLEX MICROSCOPIC     EKG None  Radiology DG Chest 2 View  Result Date: 04/22/2020 CLINICAL DATA:  Recurrent staph infection EXAM: CHEST - 2 VIEW COMPARISON:  02/14/2020 FINDINGS: Small right pleural effusion has developed, new since prior examination mild associated right basilar atelectasis. Lungs are otherwise clear. No pneumothorax. No pleural effusion on the left. Moderate cardiomegaly is stable. Pulmonary vascularity is normal. Healing left proximal humeral fracture again noted. No acute bone abnormality. IMPRESSION: Interval development of small right pleural effusion. Stable moderate cardiomegaly. Electronically Signed   By: Fidela Salisbury MD   On: 04/22/2020 01:33    Procedures Procedures (including critical care time)  Medications Ordered in ED Medications  carvedilol (COREG) tablet 6.25 mg (has no administration in time range)  cloNIDine (CATAPRES) tablet 0.2 mg (has no administration in time range)  hydrALAZINE (APRESOLINE) tablet 100 mg (has no administration in time range)    ED Course  I have reviewed the triage vital signs and the nursing notes.  Pertinent labs & imaging results that were available during my care of the patient were reviewed by me and considered in my medical decision making (see chart for details).    MDM Rules/Calculators/A&P                          Patient presents to the ED with complaints of acute worsening of her bilateral lower extremity wounds. On arrival she is nontoxic, her vitals are notable for significantly elevated blood pressure, she has not had her nighttime blood pressure medication-she does not have any specific symptoms to raise concern for hypertensive emergency, we will give her nighttime dose of her antihypertensive medications in the emergency department and monitor.  Patient has significant cellulitis of the bilateral lower extremities.   Additional history obtained:  Additional history obtained from chart review and nursing note review.  Lab  Tests:  I reviewed and interpreted labs, which included:  CBC: Anemia similar to prior.  No significant leukocytosis. CMP: Mildly worsening renal function.  Mild hypokalemia- will check EKG.  Lactic acid: WNL  Imaging Studies ordered:  CXR was ordered by triage, I independently visualized and interpreted imaging which showed Interval development of small right pleural effusion. Stable moderate cardiomegaly.  Patient ultimately with significant cellulitis, I discussed with ED pharmacist Jeneen Rinks, in agreement with [place] at this time can de-escalate with admission.  Will discuss with hospitalist service for admission given degree of cellulitis.  04:26: CONSULT: Discussed w/ hospitalist Dr. Alcario Drought- accepts admission.   Findings and plan of care discussed with supervising physician Dr. Dina Rich who is in agreement.   Portions of this note were generated with Lobbyist. Dictation errors may occur despite best attempts at proofreading.  Final Clinical Impression(s) / ED Diagnoses Final diagnoses:  Cellulitis of both lower extremities  Hypertension, unspecified type    Rx / DC Orders ED Discharge Orders    None       Amaryllis Dyke, PA-C 04/22/20 0458    Merryl Hacker, MD 04/25/20 [phone]

## 2020-04-22 NOTE — ED Notes (Signed)
Ordered breakfast--Vanessa [name] 

## 2020-04-22 NOTE — ED Triage Notes (Signed)
Pt presents to ED BIB [facility] from [facility]. Pt reports that she was in rehab being treated for staph infection. Pt states that wounds just opened again and became purulent.

## 2020-04-22 NOTE — TOC Initial Note (Signed)
Transition of Care [facility]) - Initial/Assessment Note    Patient Details  Name: Vanessa [name] MRN: [id number] Date of Birth: August 04, 1955  [facility] CM/SW Contact:    Marilu Favre, RN Phone Number: 04/22/2020, 4:06 PM  Clinical Narrative:                 Patient from home with husband and [facility]. Confirmed with Tommi Rumps with [facility]. Patient will need resumption of care orders.  Patient recently went to [facility] for rehab.   Patient has walker and a cane at home   Expected Discharge Plan: [place] Barriers to Discharge: Continued Medical Work up   Patient Goals and CMS Choice Patient states their goals for this hospitalization and ongoing recovery are:: to return to home CMS Medicare.gov Compare Post Acute Care list provided to:: Patient Choice offered to / list presented to : Patient  Expected Discharge Plan and Services Expected Discharge Plan: [place]   Discharge Planning Services: CM Consult Post Acute Care Choice: [place] arrangements for the past 2 months: [place]                 DME Arranged: N/A         HH Arranged: RN [place] Agency: [place] Date [place]: 04/22/20 Time [place]: [place] Representative spoke with at [place]: Tommi Rumps , patient will need resumption of care orders  Prior Living Arrangements/Services Living arrangements for the past 2 months: Single Family Home Lives with:: Spouse Patient language and need for interpreter reviewed:: Yes Do you feel safe going back to the place where you live?: Yes      Need for Family Participation in Patient Care: Yes (Comment) Care giver support system in place?: Yes (comment) Current home services: DME Criminal Activity/Legal Involvement Pertinent to Current Situation/Hospitalization: No - Comment as needed  Activities of Daily Living Home Assistive Devices/Equipment: Cane (specify quad or  straight), Walker (specify type) ADL Screening (condition at time of admission) Patient's cognitive ability adequate to safely complete daily activities?: Yes Is the patient deaf or have difficulty hearing?: No Does the patient have difficulty seeing, even when wearing glasses/contacts?: No Does the patient have difficulty concentrating, remembering, or making decisions?: No Patient able to express need for assistance with ADLs?: Yes Does the patient have difficulty dressing or bathing?: No Independently performs ADLs?: Yes (appropriate for developmental age) Does the patient have difficulty walking or climbing stairs?: Yes Weakness of Legs: Both Weakness of Arms/Hands: None  Permission Sought/Granted   Permission granted to share information with : No              Emotional Assessment Appearance:: Appears stated age Attitude/Demeanor/Rapport: Engaged Affect (typically observed): Accepting Orientation: : Oriented to Self, Oriented to Place, Oriented to  Time, Oriented to Situation Alcohol / Substance Use: Not Applicable Psych Involvement: No (comment)  Admission diagnosis:  Bilateral lower leg cellulitis [L03.116, L03.115] Cellulitis of both lower extremities [L03.115, L03.116] Hypertension, unspecified type [I10] Patient Active Problem List   Diagnosis Date Noted  . Bilateral lower leg cellulitis 04/22/2020  . Neurocognitive deficits 12/11/2019  . Edema 12/04/2019  . AKI (acute kidney injury) ([place]) 12/03/2019  . Hypothermia 11/26/2019  . CKD (chronic kidney disease) stage 3, GFR 30-59 ml/min (HCC)   . Hypernatremia   . COVID-19 virus infection   . Generalized weakness   . Hypertensive urgency   . Allergic angioedema 10/19/2019  .  Anasarca 10/18/2019  . Angioedema 10/18/2019  . Hypertensive emergency 10/18/2019  . Trichimoniasis 10/17/2019  . Macrocytic anemia 10/05/2019  . Esophageal dysmotility 10/04/2019  . Pressure injury of skin 09/22/2019  . Endometrial  cancer ([place]) 09/21/2019  . Left humeral fracture 09/21/2019  . Iron deficiency anemia due to chronic blood loss 09/21/2019  . MSSA bacteremia 09/18/2019  . Postmenopausal bleeding 09/15/2019  . Thrombocytopenia ([place]) 09/15/2019  . Generalized pruritus 09/15/2019  . Benign hypertension with CKD (chronic kidney disease) stage III ([place]) 09/15/2019  . Hyperuricemia 09/15/2019  . Hypokalemia 09/15/2019  . Symptomatic anemia 09/14/2019  . Non compliance w medication regimen 03/24/2015  . Dyslipidemia 03/24/2015  . DM type 2 (diabetes mellitus, type 2) ([place]) 05/23/2014  . HTN (hypertension) 05/23/2014   PCP:  Merryl Hacker No Pharmacy:   [facility] [address] ([address]), [place] [address] [place] ([place]) [place] [address] Phone: [phone] Fax: [phone]     Social Determinants of Health (SDOH) Interventions    Readmission Risk Interventions Readmission Risk Prevention Plan 11/08/2019 10/21/2019 09/25/2019  Transportation Screening Complete Complete Complete  PCP or Specialist Appt within 3-5 Days - - Not Complete  Not Complete comments - - plan for SNF  HRI or [place] - - Complete  Social Work Consult for [place] Planning/Counseling - - Complete  Palliative Care Screening - - Not Applicable  Medication Review Press photographer) Complete Referral to Pharmacy Referral to Pharmacy  PCP or Specialist appointment within 3-5 days of discharge Complete Complete -  [place] or Home Care Consult Complete (No Data) -  SW Recovery Care/Counseling Consult Complete Complete -  Palliative Care Screening Not Applicable Not Applicable -  Skilled Nursing Facility Complete Complete -  Some recent data might be hidden

## 2020-04-22 NOTE — Consult Note (Signed)
WOC Nurse Consult Note: Reason for Consult: Bilateral LEs with chronic, nonhealing  Lesions on bilateral LEs. Last seen by my partner, S. Tora Perches on 09/16/19. Wound type: venous insufficiency, lymphedema Pressure Injury POA: Yes/No/NA Measurement: Several bulbus lesions on the bilateral LEs, many have epithelial tissue covering and no wound bed. Two large full thickness areas on the left LE at medial and anterior aspect of leg with no epithelial tissue "cover" and soft, wound bed. The medial LE wound measures 11cm x 9cm with 0.2cm depth and the anterior wound measures 6cm x 9cm x 0.2cm Wound bed: Macerated, white/grey tissue ion mounded lesions Drainage (amount, consistency, odor) small amount of light yellow exudate from the two lesions on the left medial and anterior LE. Lesions on right LE have no exudate Periwound: dry, intact  Dressing procedure/placement/frequency: I have provided Nursing staff with conservative care guidance in the form of daily dressings for her chronic LE condition using xeroform gauze as an antimicrobial and nonadherent wound contact layer. I have also provided bilateral pressure redistribution heel boots for her use while in house to correct the lateral rotation of the LE and offload the heelsl.  [place] nursing team will not follow, but will remain available to this patient, the nursing and medical teams.  Please re-consult if needed. Thanks, Maudie Flakes, MSN, RN, [place], Arther Abbott  Pager# [phone]

## 2020-04-22 NOTE — ED Notes (Signed)
Please call pt's husband with update.

## 2020-04-22 NOTE — H&P (Signed)
History and Physical    Vanessa [name] KPV:[id number] DOB: 02-Jan-1956 DOA: 04/22/2020  PCP: Pcp, No  Patient coming from: Home  I have personally briefly reviewed patient's old medical records in [place]  Chief Complaint: BLE wound infections  HPI: Vanessa [name] is a 64 y.o. female with medical history significant of HTN, CKD stage 3, DM2.  Recent diagnosis of endometrial cancer.  Pt presents to the ED with acutely worsening BLE wounds, now with purulent drainage.  Pt has h/o BLE wounds that were infected with MSSA and MSSA bacteremia in March of this year.  Discharged initially to SNF before returning home.  She has been having wound care come out to house about once per week recently.  Wounds had been looking better until 1 week ago she had onset of erythema, purulent drainage, increased pain.  No fevers, chills, N/V, CP, SOB, nor abd pain.   ED Course: Pt started on empiric zosyn / vanc for BLE wounds which are grossly infected.  Pt with no SIRS  BP running as high as 675Q systolic but pt without any specific symptoms attributable to this.   Review of Systems: As per HPI, otherwise all review of systems negative.  Past Medical History:  Diagnosis Date  . Anemia 10/09/2019  . Asthma   . CKD (chronic kidney disease) stage 3, GFR 30-59 ml/min (HCC)   . Dental disease   . Diabetes mellitus without complication ([place])   . Endometrial cancer ([place])   . Esophageal dysmotility   . Heart murmur   . HLD (hyperlipidemia)   . Hypertension   . MSSA bacteremia 09/2019  . Obesity, Class III, BMI 40-49.9 (morbid obesity) ([place])   . Pressure ulcer     Past Surgical History:  Procedure Laterality Date  . BIOPSY  10/08/2019   Procedure: BIOPSY;  Surgeon: Clarene Essex, MD;  Location: [facility] ENDOSCOPY;  Service: Endoscopy;;  . ESOPHAGOGASTRODUODENOSCOPY N/A 10/08/2019   Procedure: ESOPHAGOGASTRODUODENOSCOPY (EGD);  Surgeon: Clarene Essex, MD;  Location: [place];  Service:  Endoscopy;  Laterality: N/A;  . TYMPANOSTOMY TUBE PLACEMENT Bilateral      reports that she has quit smoking. She has never used smokeless tobacco. She reports current alcohol use. She reports that she does not use drugs.  Allergies  Allergen Reactions  . Ace Inhibitors     Never prescribed but she has had angioedema  . Angiotensin Receptor Blockers     ARB never Rxed but PMH  of angioedema in March 2021    Family History  Problem Relation Age of Onset  . Stroke Mother   . Diabetes Mother   . Hypertension Mother   . Cancer Mother        possible ovarian  . Diabetes Brother   . Hypertension Brother   . Cancer Maternal Grandmother        possible ovarian     Prior to Admission medications   Medication Sig Start Date End Date Taking? Authorizing Provider  acetaminophen (TYLENOL) 500 MG tablet Take 500 mg by mouth every 6 (six) hours as needed for moderate pain.    [provider]  Amino Acids-Protein Hydrolys (FEEDING SUPPLEMENT, PRO-STAT SUGAR FREE 64,) LIQD Take 30 mLs by mouth 3 (three) times daily with meals. Patient not taking: Reported on 02/15/2020 11/21/19   Jonetta Osgood, MD  aspirin 81 MG chewable tablet Chew 81 mg by mouth daily.    [provider]  benzocaine (ORAJEL) 10 % mucosal gel Use as  directed 1 application in the mouth or throat 4 (four) times daily as needed for mouth pain.    [provider]  Blood Glucose Monitoring Suppl (BAYER CONTOUR MONITOR) w/Device KIT Use as directed for 3 times daily testing of blood glucose. E11.9 12/01/16   Maren Reamer, MD  carvedilol (COREG) 6.25 MG tablet Take 1 tablet (6.25 mg total) by mouth 2 (two) times daily with a meal. Patient not taking: Reported on 02/15/2020 12/13/19   Medina-Vargas, Monina C, NP  Chlorhexidine Gluconate Cloth 2 % PADS Apply 6 each topically daily. Patient not taking: Reported on 02/15/2020 12/13/19   Medina-Vargas, Monina C, NP  cloNIDine (CATAPRES) 0.2 MG tablet Take 1  tablet (0.2 mg total) by mouth 2 (two) times daily. Patient not taking: Reported on 02/15/2020 12/13/19   Medina-Vargas, Monina C, NP  diphenhydrAMINE (BENADRYL) 25 MG tablet Take 25 mg by mouth 3 (three) times daily as needed for itching.    [provider]  feeding supplement, ENSURE ENLIVE, (ENSURE ENLIVE) LIQD Take 237 mLs by mouth 3 (three) times daily between meals. Patient not taking: Reported on 02/15/2020 11/21/19   Jonetta Osgood, MD  ferrous sulfate 325 (65 FE) MG tablet Take 1 tablet (325 mg total) by mouth 2 (two) times daily with a meal. Patient not taking: Reported on 02/15/2020 12/13/19   Medina-Vargas, Monina C, NP  glucose blood (BAYER CONTOUR TEST) test strip Use as instructed for 3 times daily testing of blood glucose. E11.9 12/13/19   Medina-Vargas, Monina C, NP  hydrALAZINE (APRESOLINE) 50 MG tablet Take 2 tablets (100 mg total) by mouth at bedtime. 03/19/20   Antonietta Breach, PA-C  hydrocortisone (PROCTOZONE-HC) 2.5 % rectal cream Place 1 application rectally 2 (two) times daily as needed for hemorrhoids or anal itching.    [provider]  INSULIN SYRINGE 1CC/29G 29G X 1/2" 1 ML MISC Insulin syringes 12/13/19   Medina-Vargas, Monina C, NP  Lancets (ONETOUCH ULTRASOFT) lancets Use as instructed 12/13/19   Medina-Vargas, Monina C, NP  loratadine (CLARITIN) 10 MG tablet Take 1 tablet (10 mg total) by mouth daily as needed for allergies. 12/13/19   Medina-Vargas, Monina C, NP  pantoprazole sodium (PROTONIX) 40 mg/20 mL PACK Take 20 mLs (40 mg total) by mouth daily. Patient not taking: Reported on 02/15/2020 12/13/19   Medina-Vargas, Monina C, NP  polyethylene glycol (MIRALAX / GLYCOLAX) 17 g packet Take 17 g by mouth daily. Patient not taking: Reported on 02/15/2020 09/25/19   Arrien, Jimmy Picket, MD    Physical Exam: Vitals:   04/22/20 0043 04/22/20 0300 04/22/20 0515  BP: (!) 243/107 (!) 226/116 (!) 167/97  Pulse: 81 81 97  Resp:  14 19  Temp: 98.1 F (36.7 C)    TempSrc:  Oral    SpO2: 100% 97% 95%    Constitutional: NAD, calm, comfortable Eyes: PERRL, lids and conjunctivae normal ENMT: Mucous membranes are moist. Posterior pharynx clear of any exudate or lesions.Normal dentition.  Neck: normal, supple, no masses, no thyromegaly Respiratory: clear to auscultation bilaterally, no wheezing, no crackles. Normal respiratory effort. No accessory muscle use.  Cardiovascular: Regular rate and rhythm, no murmurs / rubs / gallops. No extremity edema. 2+ pedal pulses. No carotid bruits.  Abdomen: no tenderness, no masses palpated. No hepatosplenomegaly. Bowel sounds positive.  Musculoskeletal: no clubbing / cyanosis. No joint deformity upper and lower extremities. Good ROM, no contractures. Normal muscle tone.  Skin:      Neurologic: CN 2-12 grossly intact. Sensation intact,  DTR normal. Strength 5/5 in all 4.  Psychiatric: Normal judgment and insight. Alert and oriented x 3. Normal mood.    Labs on Admission: I have personally reviewed following labs and imaging studies  CBC: Recent Labs  Lab 04/22/20 0056  WBC 4.1  NEUTROABS 2.9  HGB 8.6*  HCT 27.9*  MCV 90.0  PLT 237   Basic Metabolic Panel: Recent Labs  Lab 04/22/20 0056  NA 142  K 3.2*  CL 112*  CO2 23  GLUCOSE 131*  BUN 28*  CREATININE 2.20*  CALCIUM 8.2*   GFR: CrCl cannot be calculated (Unknown ideal weight.). Liver Function Tests: Recent Labs  Lab 04/22/20 0056  AST 14*  ALT 13  ALKPHOS 95  BILITOT 0.3  PROT 6.7  ALBUMIN 2.5*   No results for input(s): LIPASE, AMYLASE in the last 168 hours. No results for input(s): AMMONIA in the last 168 hours. Coagulation Profile: No results for input(s): INR, PROTIME in the last 168 hours. Cardiac Enzymes: No results for input(s): CKTOTAL, CKMB, CKMBINDEX, TROPONINI in the last 168 hours. BNP (last 3 results) No results for input(s): PROBNP in the last 8760 hours. HbA1C: No results for input(s): HGBA1C in the last 72  hours. CBG: No results for input(s): GLUCAP in the last 168 hours. Lipid Profile: No results for input(s): CHOL, HDL, LDLCALC, TRIG, CHOLHDL, LDLDIRECT in the last 72 hours. Thyroid Function Tests: No results for input(s): TSH, T4TOTAL, FREET4, T3FREE, THYROIDAB in the last 72 hours. Anemia Panel: No results for input(s): VITAMINB12, FOLATE, FERRITIN, TIBC, IRON, RETICCTPCT in the last 72 hours. Urine analysis:    Component Value Date/Time   COLORURINE YELLOW 11/06/2019 0828   APPEARANCEUR CLEAR 11/06/2019 0828   LABSPEC 1.010 11/06/2019 0828   PHURINE 6.0 11/06/2019 0828   GLUCOSEU NEGATIVE 11/06/2019 0828   HGBUR MODERATE (A) 11/06/2019 0828   BILIRUBINUR NEGATIVE 11/06/2019 0828   KETONESUR NEGATIVE 11/06/2019 0828   PROTEINUR 100 (A) 11/06/2019 0828   UROBILINOGEN 0.2 03/06/2014 1644   NITRITE NEGATIVE 11/06/2019 0828   LEUKOCYTESUR SMALL (A) 11/06/2019 0828    Radiological Exams on Admission: DG Chest 2 View  Result Date: 04/22/2020 CLINICAL DATA:  Recurrent staph infection EXAM: CHEST - 2 VIEW COMPARISON:  02/14/2020 FINDINGS: Small right pleural effusion has developed, new since prior examination mild associated right basilar atelectasis. Lungs are otherwise clear. No pneumothorax. No pleural effusion on the left. Moderate cardiomegaly is stable. Pulmonary vascularity is normal. Healing left proximal humeral fracture again noted. No acute bone abnormality. IMPRESSION: Interval development of small right pleural effusion. Stable moderate cardiomegaly. Electronically Signed   By: Fidela Salisbury MD   On: 04/22/2020 01:33    EKG: Independently reviewed.  Assessment/Plan Principal Problem:   Bilateral lower leg cellulitis Active Problems:   DM type 2 (diabetes mellitus, type 2) (HCC)   Non compliance w medication regimen   Benign hypertension with CKD (chronic kidney disease) stage III (HCC)   Endometrial cancer (HCC)   Iron deficiency anemia due to chronic blood loss     1. Acute BLE cellulitis - 1. Acute cellulitis superimposed on chronic ulcers 2. LE wound pathway 3. Futher work up to include: 1. Wound and blood cultures pending 2. Check ABIs 3. Check MRSA PCR nares, was MSSA in March; however, pt with SNF stay and ongoing wound care since that time so is at risk for MRSA still 4. Check CRP, ESR 5. Check prealbumin 4. Wound care consult 5. Treat with empiric rocephin / flagyl /  vanc for the moment 1. Close monitoring of kidney function while on Vanc in this CKD stage 3-4 patient! 2. Daily BMP 2. Endometrial CA - 1. Patient was supposed to see Dr. Sondra Come today 2. I sent a message to Dr. Sondra Come in [place] to inform him of patient being admitted to hospital (and wont make office visit therefore). 3. Dr. Sondra Come has seen message 4. Obviously am concerned given that every time it seems like we are going to start treatment for endometrial CA, pt either ends up getting sick with something, or no-showing for appointment or something 5. Essentially only treated with progesterone therapy thus far, diagnosis was in march. 6. Looks like tumor has progressed to grade 3 according 3. HTN - 1. Resume home BP meds 2. Adding PRN labetalol for BP control 4. DM2 - 1. Sensitive SSI AC/HS 2. Checking A1C 5. Iron Def anemia due to chronic blood loss - 1. Pt with h/o PMB, admitted in March actually for anemia due to PMB where they ultimately found endometrial CA. 6. CKD stage 3-4 1. Renal function at baseline 2. However need to monitor closely while patient is on vancomycin for cellulitis. 3. Daily BMP  DVT prophylaxis: none at the moment: h/o bleeding due to endometrial CA these past months and ongoing anemia, and cant do SCDs due to BLE infections. Code Status: Full Family Communication: No family in room Disposition Plan: Home after admit Consults called: None Admission status: Place in 23    Rashena Dowling, [place] Hospital[facility]  How to contact the [facility]  Attending or Consulting provider [place] or covering provider during after hours [place], for this patient?  1. Check the care team in [facility] and look for a) attending/consulting [facility] provider listed and b) the [facility] team listed 2. Log into [email or web address]  Amion Physician Scheduling and messaging for groups and whole hospitals  On call and physician scheduling software for group practices, residents, hospitalists and other medical providers for call, clinic, rotation and shift schedules. OnCall [employer] is a hospital-wide system for scheduling doctors and paging doctors on call. EasyPlot is for scientific plotting and data analysis.  [email or web address]  and use [place]'s universal password to access. If you do not have the password, please contact the hospital operator.  3. Locate the [facility] provider you are looking for under [facility] and page to a number that you can be directly reached. 4. If you still have difficulty reaching the provider, please page the [facility] (Director on Call) for the Hospitalists listed on amion for assistance.  04/22/2020, 5:33 AM

## 2020-04-22 NOTE — Progress Notes (Signed)
ABI Lower Ext. study completed.   See CVProc for preliminary results.   Griffin Basil, RDMS, RVT

## 2020-04-22 NOTE — Progress Notes (Signed)
Pharmacy Antibiotic Note  Tyriana Helmkamp is a 64 y.o. female admitted on 04/22/2020 with bilateral lower extremity cellullitis/wound infection.  Pharmacy has been consulted for Vancomycin dosing. WBC WNL. Noted renal dysfunction.   Plan: Vancomycin 1000 mg IV q24h Ceftriaxone/Flagyl per MD Trend WBC, temp, renal function  F/U infectious work-up Drug levels as indicated  Temp (24hrs), Avg:98.1 F (36.7 C), Min:98.1 F (36.7 C), Max:98.1 F (36.7 C)  Recent Labs  Lab 04/22/20 0056  WBC 4.1  CREATININE 2.20*  LATICACIDVEN 0.8    CrCl cannot be calculated (Unknown ideal weight.).    Allergies  Allergen Reactions   Ace Inhibitors     Never prescribed but she has had angioedema   Angiotensin Receptor Blockers     ARB never Rxed but PMH  of angioedema in March 2021   Narda Bonds, PharmD, [place] Pharmacist Phone: [phone]

## 2020-04-22 NOTE — Care Management Obs Status (Signed)
[facility] NOTIFICATION   Patient Details  Name: Vanessa [name] MRN: [id number] Date of Birth: 1956-04-19   Medicare Observation Status Notification Given:  Yes    Marilu Favre, RN 04/22/2020, 4:04 PM

## 2020-04-22 NOTE — Progress Notes (Signed)
Inpatient Diabetes Program Recommendations  AACE/ADA: New Consensus Statement on Inpatient Glycemic Control   Target Ranges:  Prepandial:   less than 140 mg/dL      Peak postprandial:   less than 180 mg/dL (1-2 hours)      Critically ill patients:  140 - 180 mg/dL   Results for Vanessa [name], Vanessa [name] (MRN [id number]) as of 04/22/2020 08:32  Ref. Range 04/22/2020 07:51  Glucose-Capillary Latest Ref Range: 70 - 99 mg/dL 130 (H)  Results for Vanessa [name], Vanessa [name] (MRN [id number]) as of 04/22/2020 08:32  Ref. Range 09/15/2019 06:16  Hemoglobin A1C Latest Ref Range: 4.8 - 5.6 % 5.7 (H)   Review of Glycemic Control  Diabetes history: DM2 (diet controlled) Outpatient Diabetes medications: None Current orders for Inpatient glycemic control: Novolog 0-9 units TID with meals, Novolog 0-5 units QHS  Inpatient Diabetes Program Recommendations:    HbgA1C:  A1C in process.   NOTE: Noted consult for diabetes coordinator. Chart reviewed. Patient has DM2 hx which is diet controlled. Last A1C 5.7% on 09/15/19 and current A1C in process. Agree with current orders.   Thanks, Barnie Alderman, RN, MSN, CDE Diabetes Coordinator Inpatient Diabetes Program [phone] (Team Pager from 8am to 5pm)

## 2020-04-22 NOTE — Progress Notes (Signed)
Initial Nutrition Assessment  DOCUMENTATION CODES:   Morbid obesity  INTERVENTION:   -1 packet Juven BID, each packet provides 95 calories, 2.5 grams of protein (collagen), and 9.8 grams of carbohydrate (3 grams sugar); also contains 7 grams of L-arginine and L-glutamine, 300 mg vitamin C, 15 mg vitamin E, 1.2 mcg vitamin B-12, 9.5 mg zinc, 200 mg calcium, and 1.5 g  Calcium Beta-hydroxy-Beta-methylbutyrate to support wound healing -Ensure Max po daily, each supplement provides 150 kcal and 30 grams of protein -MVI with minerals daily  NUTRITION DIAGNOSIS:   Increased nutrient needs related to wound healing as evidenced by estimated needs.  GOAL:   Patient will meet greater than or equal to 90% of their needs  MONITOR:   PO intake, Supplement acceptance, Labs, Weight trends, Skin, I & O's  REASON FOR ASSESSMENT:   Consult Assessment of nutrition requirement/status, Wound healing  ASSESSMENT:   Vanessa [name] is a 64 y.o. female with medical history significant of HTN, CKD stage 3, DM2.  Recent diagnosis of endometrial cancer.  Pt admitted with bilateral BLE cellulitis.   Reviewed I/O's: +300 ml x 24 hours  Spoke with pt at bedside, who reports that she had returned home about 3 months ago after spending time in a rehab facility for wound care. Pt shares that her wounds were completely healed when she returned home, however, "they came back" about a week ago. Her husband attempted to wrap her legs without much success.   Pt reports good appetite PTA. She usually consumes 3 meals per day (Breakfast: cereal or grits and eggs; lunch: sandwich; Dinner: meat, starch, and vegetable). Pt consumes tea and Pepsi for beverages. She consumed almost all of her breakfast this morning.   Pt suspects she has lost weight, due to her arms being "flabby". Per her reports, UBW is around 225#. Obtained wt via bedscale; no wt loss indicated.   Discussed with pt importance of good meal and  supplement intake to promote healing. Pt does not think she took vitamins or supplements for her wounds previously, but is interested in trying some while hospitalized. RD reviewed supplement options with pt and she is interested in Ensure Max, MVI, and Juven.   Case discussed with RN.   Labs reviewed: CBGS: 130 (inpatient orders for glycemic control are 0-5 units insulin aspart daily at bedtime and 0-9 units insuln aspart TID with meals).   NUTRITION - FOCUSED PHYSICAL EXAM:    Most Recent Value  Orbital Region No depletion  Upper Arm Region No depletion  Thoracic and Lumbar Region No depletion  Buccal Region No depletion  Temple Region No depletion  Clavicle Bone Region No depletion  Clavicle and Acromion Bone Region No depletion  Scapular Bone Region No depletion  Dorsal Hand No depletion  Patellar Region No depletion  Anterior Thigh Region No depletion  Posterior Calf Region No depletion  Edema (RD Assessment) Severe  Hair Reviewed  Eyes Reviewed  Mouth Reviewed  Skin Reviewed  Nails Reviewed       Diet Order:   Diet Order            Diet Carb Modified Fluid consistency: Thin; Room service appropriate? Yes  Diet effective now                 EDUCATION NEEDS:   Education needs have been addressed  Skin:  Skin Assessment: Skin Integrity Issues: Skin Integrity Issues:: Other (Comment) Other: bulbous lesions on bilateral LEs with epithelial tissue covering; full thickness  areas of LLE  Last BM:  Unknown  Height:   Ht Readings from Last 1 Encounters:  02/21/20 4\' 11"  (1.499 m)    Weight:   Wt Readings from Last 1 Encounters:  04/22/20 107.3 kg    Ideal Body Weight:  44.5 kg  BMI:  Body mass index is 47.78 kg/m.  Estimated Nutritional Needs:   Kcal:  1550-1750  Protein:  95-110 grams  Fluid:  > 1.5 L    [name], RD, LDN, [place] Registered Dietitian II Certified Diabetes Care and Education Specialist Please refer to [facility] for RD and/or RD  on-call/weekend/after hours pager

## 2020-04-22 NOTE — Progress Notes (Signed)
[name] is a 64 yr old woman who presented to [facility] on 04/21/2020 with complaints of foul drainage and pain in her lower extremities. These wounds are chronic and as per the patient's report had been "healed up" at one point. However in the past week she has had onset of erythema, purulent drainage and increased pain.  The patient has a past medical history significant for hypertension, CKD III, DM II, and morbid obesity with BMI of 47.7. She has recently been diagnosed with endometrial cancer.   The patient denies fevers, chills, shortness of breath, or abdominal pain.  [facility] were consulted to admit the patient for further evaluation and treatment. She was admitted by my colleague Dr. Alcario Drought earlier this morning.  The patient is lying in bed. She is complaining of pain in her legs. There is a foul odor in the room. She is awake, alert, and oriented x 3. No acute distress. Heart and lung sounds are within normal limits. Abdomen is morbidly obesity. Bowel sounds are distant. I am unable to evaluate the abdomen for hernias, masses, or organomegaly due to the patient's body habitus.  The bilateral lower extremities are swollen, but show wrinkling indicative of successful diuresis recently. There are multiple chronic ulcerations. There is purulent drainage and smell.  The patient has received vancomycin, zosyn, and flagyl. Blood cultures x 2 have had no growth.

## 2020-04-22 NOTE — Telephone Encounter (Signed)
Called pt to r/s today's consult with Dr. Sondra Come due to pt being in the hospital. No answer, LVM for return call.

## 2020-04-23 DIAGNOSIS — D500 Iron deficiency anemia secondary to blood loss (chronic): Secondary | ICD-10-CM | POA: Diagnosis present

## 2020-04-23 DIAGNOSIS — Z7982 Long term (current) use of aspirin: Secondary | ICD-10-CM | POA: Diagnosis not present

## 2020-04-23 DIAGNOSIS — D631 Anemia in chronic kidney disease: Secondary | ICD-10-CM | POA: Diagnosis present

## 2020-04-23 DIAGNOSIS — Z794 Long term (current) use of insulin: Secondary | ICD-10-CM | POA: Diagnosis not present

## 2020-04-23 DIAGNOSIS — N179 Acute kidney failure, unspecified: Secondary | ICD-10-CM | POA: Diagnosis present

## 2020-04-23 DIAGNOSIS — I129 Hypertensive chronic kidney disease with stage 1 through stage 4 chronic kidney disease, or unspecified chronic kidney disease: Secondary | ICD-10-CM | POA: Diagnosis present

## 2020-04-23 DIAGNOSIS — N184 Chronic kidney disease, stage 4 (severe): Secondary | ICD-10-CM | POA: Diagnosis present

## 2020-04-23 DIAGNOSIS — L0390 Cellulitis, unspecified: Secondary | ICD-10-CM | POA: Diagnosis present

## 2020-04-23 DIAGNOSIS — J45909 Unspecified asthma, uncomplicated: Secondary | ICD-10-CM | POA: Diagnosis present

## 2020-04-23 DIAGNOSIS — Z20822 Contact with and (suspected) exposure to covid-19: Secondary | ICD-10-CM | POA: Diagnosis present

## 2020-04-23 DIAGNOSIS — E872 Acidosis: Secondary | ICD-10-CM | POA: Diagnosis present

## 2020-04-23 DIAGNOSIS — Z9119 Patient's noncompliance with other medical treatment and regimen: Secondary | ICD-10-CM | POA: Diagnosis not present

## 2020-04-23 DIAGNOSIS — C541 Malignant neoplasm of endometrium: Secondary | ICD-10-CM | POA: Diagnosis present

## 2020-04-23 DIAGNOSIS — Z9114 Patient's other noncompliance with medication regimen: Secondary | ICD-10-CM | POA: Diagnosis not present

## 2020-04-23 DIAGNOSIS — L98499 Non-pressure chronic ulcer of skin of other sites with unspecified severity: Secondary | ICD-10-CM | POA: Diagnosis present

## 2020-04-23 DIAGNOSIS — Z6841 Body Mass Index (BMI) 40.0 and over, adult: Secondary | ICD-10-CM | POA: Diagnosis not present

## 2020-04-23 DIAGNOSIS — E876 Hypokalemia: Secondary | ICD-10-CM | POA: Diagnosis present

## 2020-04-23 DIAGNOSIS — L03116 Cellulitis of left lower limb: Secondary | ICD-10-CM | POA: Diagnosis present

## 2020-04-23 DIAGNOSIS — Z8616 Personal history of COVID-19: Secondary | ICD-10-CM | POA: Diagnosis not present

## 2020-04-23 DIAGNOSIS — E11622 Type 2 diabetes mellitus with other skin ulcer: Secondary | ICD-10-CM | POA: Diagnosis present

## 2020-04-23 DIAGNOSIS — R3129 Other microscopic hematuria: Secondary | ICD-10-CM | POA: Diagnosis not present

## 2020-04-23 DIAGNOSIS — E1122 Type 2 diabetes mellitus with diabetic chronic kidney disease: Secondary | ICD-10-CM | POA: Diagnosis present

## 2020-04-23 DIAGNOSIS — L03115 Cellulitis of right lower limb: Secondary | ICD-10-CM | POA: Diagnosis present

## 2020-04-23 DIAGNOSIS — E785 Hyperlipidemia, unspecified: Secondary | ICD-10-CM | POA: Diagnosis present

## 2020-04-23 LAB — CBC
HCT: 26.4 % — ABNORMAL LOW (ref 36.0–46.0)
Hemoglobin: 8.1 g/dL — ABNORMAL LOW (ref 12.0–15.0)
MCH: 27.5 pg (ref 26.0–34.0)
MCHC: 30.7 g/dL (ref 30.0–36.0)
MCV: 89.5 fL (ref 80.0–100.0)
Platelets: 229 10*3/uL (ref 150–400)
RBC: 2.95 MIL/uL — ABNORMAL LOW (ref 3.87–5.11)
RDW: 17.3 % — ABNORMAL HIGH (ref 11.5–15.5)
WBC: 4.1 10*3/uL (ref 4.0–10.5)
nRBC: 0 % (ref 0.0–0.2)

## 2020-04-23 LAB — BASIC METABOLIC PANEL
Anion gap: 12 (ref 5–15)
BUN: 35 mg/dL — ABNORMAL HIGH (ref 8–23)
CO2: 19 mmol/L — ABNORMAL LOW (ref 22–32)
Calcium: 8.1 mg/dL — ABNORMAL LOW (ref 8.9–10.3)
Chloride: 111 mmol/L (ref 98–111)
Creatinine, Ser: 2.42 mg/dL — ABNORMAL HIGH (ref 0.44–1.00)
GFR, Estimated: 20 mL/min — ABNORMAL LOW (ref >60)
Glucose, Bld: 117 mg/dL — ABNORMAL HIGH (ref 70–99)
Potassium: 3.7 mmol/L (ref 3.5–5.1)
Sodium: 142 mmol/L (ref 135–145)

## 2020-04-23 LAB — BLOOD CULTURE ID PANEL (REFLEXED) - BCID2

## 2020-04-23 LAB — GLUCOSE, CAPILLARY
Glucose-Capillary: 110 mg/dL — ABNORMAL HIGH (ref 70–99)
Glucose-Capillary: 144 mg/dL — ABNORMAL HIGH (ref 70–99)
Glucose-Capillary: 98 mg/dL (ref 70–99)
Glucose-Capillary: 104 mg/dL — ABNORMAL HIGH (ref 70–99)

## 2020-04-23 LAB — HEMOGLOBIN A1C
Hgb A1c MFr Bld: 5.9 % — ABNORMAL HIGH (ref 4.8–5.6)
Mean Plasma Glucose: 123 mg/dL

## 2020-04-23 MED ORDER — POLYETHYLENE GLYCOL 3350 17 G PO PACK
17.0000 g | PACK | Freq: Every day | ORAL | Status: DC
  Administered 2020-04-24 – 2020-04-30 (×7): 17 g via ORAL
  Filled 2020-04-23 (×7): qty 1

## 2020-04-23 MED ORDER — HYDRALAZINE HCL 50 MG PO TABS
100.0000 mg | ORAL_TABLET | Freq: Three times a day (TID) | ORAL | Status: DC
  Administered 2020-04-23 – 2020-04-30 (×21): 100 mg via ORAL
  Filled 2020-04-23 (×21): qty 2

## 2020-04-23 MED ORDER — AMLODIPINE BESYLATE 10 MG PO TABS
10.0000 mg | ORAL_TABLET | Freq: Every day | ORAL | Status: DC
  Administered 2020-04-23 – 2020-04-30 (×8): 10 mg via ORAL
  Filled 2020-04-23 (×8): qty 1

## 2020-04-23 MED ORDER — FERROUS SULFATE 325 (65 FE) MG PO TABS
325.0000 mg | ORAL_TABLET | Freq: Two times a day (BID) | ORAL | Status: DC
  Administered 2020-04-24 – 2020-04-30 (×13): 325 mg via ORAL
  Filled 2020-04-23 (×13): qty 1

## 2020-04-23 MED ORDER — HYDRALAZINE HCL 50 MG PO TABS: 100.0000 mg | ORAL_TABLET | Freq: Every day | ORAL | Status: DC

## 2020-04-23 MED ORDER — CARVEDILOL 6.25 MG PO TABS: 6.2500 mg | ORAL_TABLET | Freq: Two times a day (BID) | ORAL | Status: DC

## 2020-04-23 NOTE — Progress Notes (Signed)
PROGRESS NOTE  Vanessa [name] HCW:[id number] DOB: [birth year] DOA: 04/22/2020 PCP: Pcp, No  Brief History   Vanessa [name] is a 64 yr old woman who presented to [facility] on 04/21/2020 with complaints of foul drainage and pain in her lower extremities. These wounds are chronic and as per Vanessa [name]'s report had been "healed up" at one point. However in Vanessa past week she has had onset of erythema, purulent drainage and increased pain.  Vanessa [name] has a past medical history significant for hypertension, CKD III, DM II, and morbid obesity with BMI of 47.7. She has recently been diagnosed with endometrial cancer.   Vanessa [name] denies fevers, chills, shortness of breath, or abdominal pain.  [facility] were consulted to admit Vanessa [name] for further evaluation and treatment. Wound care has been consulted.  Vanessa [name] is receiving IV Vancomycin Flagyl, and rocephin. She is admitted to a telemetry bed.  Consultants  . None  Procedures  . None  Antibiotics   Anti-infectives (From admission, onward)   Start     Dose/Rate Route Frequency Ordered Stop   04/23/20 1000  vancomycin (VANCOCIN) IVPB 1000 mg/200 mL premix        1,000 mg 200 mL/hr over 60 Minutes Intravenous Every 24 hours 04/22/20 0436     04/22/20 1000  cefTRIAXone (ROCEPHIN) 2 g in sodium chloride 0.9 % 100 mL IVPB        2 g 200 mL/hr over 30 Minutes Intravenous Every 24 hours 04/22/20 0423     04/22/20 0800  metroNIDAZOLE (FLAGYL) IVPB 500 mg        500 mg 100 mL/hr over 60 Minutes Intravenous Every 8 hours 04/22/20 0423     04/22/20 0330  vancomycin (VANCOREADY) IVPB 1500 mg/300 mL        1,500 mg 150 mL/hr over 120 Minutes Intravenous  Once 04/22/20 0308 04/22/20 0721   04/22/20 0315  piperacillin-tazobactam (ZOSYN) IVPB 3.375 g        3.375 g 100 mL/hr over 30 Minutes Intravenous  Once 04/22/20 0308 04/22/20 0344    .  Marland Kitchen   Interval History/Subjective  Vanessa [name] is resting comfortably. No new  complaints.  Objective   Vitals:  Vitals:   04/23/20 1356 04/23/20 1550  BP: (!) 165/76 (!) 165/79  Pulse: 67 65  Resp:  13  Temp:  98.3 F (36.8 C)  SpO2:  99%    Exam:  Constitutional:  . Vanessa [name] is awake, alert, and oriented x 3. No acute distress. Respiratory:  . No increased work of breathing. . No wheezes, rales, or rhonchi . No tactile fremitus Cardiovascular:  . Regular rate and rhythm . No murmurs, ectopy, or gallups. . No lateral PMI. No thrills. Abdomen:  . Abdomen is soft, non-tender, non-distended . No hernias, masses, or organomegaly . Normoactive bowel sounds.  Musculoskeletal:  . No cyanosis, clubbing, or edema Skin:  . No rashes . palpation of skin: no induration or nodules . Bilateral lower extremities with weeping chronic ulcerations and evidence of recent severe edema. Now with deep wrinkles. Neurologic:  . CN 2-12 intact . Sensation all 4 extremities intact Psychiatric:  . Mental status o Mood, affect appropriate o Orientation to person, place, time  . judgment and insight appear intact   I have personally reviewed Vanessa following:   Today's Data  . Vitals, BMP, CBC .  Micro Data  . Blood cultures 04/22/2020: 1/2 positive for a coagulase negative staph.  Scheduled Meds: . amLODipine  10 mg Oral Daily  . aspirin  81 mg Oral Daily  . carvedilol  6.25 mg Oral BID WC  . cloNIDine  0.2 mg Oral BID  . heparin injection (subcutaneous)  5,000 Units Subcutaneous Q8H  . hydrALAZINE  100 mg Oral Q8H  . hydrocerin   Topical Daily  . insulin aspart  0-5 Units Subcutaneous QHS  . insulin aspart  0-9 Units Subcutaneous TID WC  . multivitamin with minerals  1 tablet Oral Daily  . nutrition supplement (JUVEN)  1 packet Oral BID BM  . polyethylene glycol  17 g Oral Daily  . Ensure Max Protein  11 oz Oral QHS   Continuous Infusions: . cefTRIAXone (ROCEPHIN)  IV 2 g (04/23/20 1012)  . metronidazole 500 mg (04/23/20 1637)  . vancomycin 1,000  mg (04/23/20 1239)    Principal Problem:   Bilateral lower leg cellulitis Active Problems:   DM type 2 (diabetes mellitus, type 2) (HCC)   Non compliance w medication regimen   Benign hypertension with CKD (chronic kidney disease) stage III (HCC)   Endometrial cancer (HCC)   Iron deficiency anemia due to chronic blood loss   LOS: 0 days   A & P  Acute bilateral cellulitis with chronic ulcerations: Vanessa [name] is on antibiotics and wound care has been consulted. Edema is being successfully addressed with elevation.   Lower extremity edema: Resolving. Vanessa [name] tells me that she was on a "fluid pill" at home, although I do not seen this on her medication reconciliation. Currently Vanessa [name] is 1L negative in terms of fluid balance. Given elevated creatinine would not opt to start diuresis unless volume status demands it.  CKD IV: Creatinine is 2.42. Baseline creatinine appears to be about 1.7, although it has been as high as 3.33 in Vanessa last 3 months. Monitor creatinine, electrolytes, and volume status.  DM II: Vanessa [name] has not been compliant with insulin at home. However, her hemoglobin A1c was only 5.9. Will follow glucoses with FSBS and SSI.  Anemia: Fe deficiency anemia. Continue iron supplements as at home.  Endometrial cancer: As per oncology as outpatient.  Noncompliance with medical regimen: noted.  I have seen and examined this [name] myself. I have spent 35 minutes in her evaluation and care.  DVT Prophylaxis: Heparin CODE STATUS: Full Code Family Communication: None available Disposition: Status is: Inpatient  Remains inpatient appropriate because:IV treatments appropriate due to intensity of illness or inability to take PO   Dispo: Vanessa [name] is from: Home              Anticipated d/c is to: Home              Anticipated d/c date is: 2 days              [name] currently is not medically stable to d/c.  Ariadne Rissmiller, DO [facility] Direct contact:  see [email or web address]  7PM-7AM contact night coverage as above 04/23/2020, 7:58 PM  LOS: 0 days

## 2020-04-23 NOTE — Progress Notes (Signed)
PHARMACY - PHYSICIAN COMMUNICATION CRITICAL VALUE ALERT - BLOOD CULTURE IDENTIFICATION (BCID)  Vanessa [name] is an 64 y.o. female who presented to [facility] on 04/22/2020 with a chief complaint of BLE wound infections.  Assessment:  Started on IV ABX for cellulitis, now w/ coag-neg staph growing in 1 of 4 bottles, likely contaminant.  Name of physician (or Provider) Contacted: B.Kyere  Current antibiotics: vancomycin and ceftriaxone  Changes to prescribed antibiotics recommended:  No changes for now.  Results for orders placed or performed during the hospital encounter of 04/22/20  Blood Culture ID Panel (Reflexed) (Collected: 04/22/2020  2:50 AM)  Result Value Ref Range   Enterococcus faecalis NOT DETECTED NOT DETECTED   Enterococcus Faecium NOT DETECTED NOT DETECTED   Listeria monocytogenes NOT DETECTED NOT DETECTED   Staphylococcus species DETECTED (A) NOT DETECTED   Staphylococcus aureus (BCID) NOT DETECTED NOT DETECTED   Staphylococcus epidermidis NOT DETECTED NOT DETECTED   Staphylococcus lugdunensis NOT DETECTED NOT DETECTED   Streptococcus species NOT DETECTED NOT DETECTED   Streptococcus agalactiae NOT DETECTED NOT DETECTED   Streptococcus pneumoniae NOT DETECTED NOT DETECTED   Streptococcus pyogenes NOT DETECTED NOT DETECTED   A.calcoaceticus-baumannii NOT DETECTED NOT DETECTED   Bacteroides fragilis NOT DETECTED NOT DETECTED   Enterobacterales NOT DETECTED NOT DETECTED   Enterobacter cloacae complex NOT DETECTED NOT DETECTED   Escherichia coli NOT DETECTED NOT DETECTED   Klebsiella aerogenes NOT DETECTED NOT DETECTED   Klebsiella oxytoca NOT DETECTED NOT DETECTED   Klebsiella pneumoniae NOT DETECTED NOT DETECTED   Proteus species NOT DETECTED NOT DETECTED   Salmonella species NOT DETECTED NOT DETECTED   Serratia marcescens NOT DETECTED NOT DETECTED   Haemophilus influenzae NOT DETECTED NOT DETECTED   Neisseria meningitidis NOT DETECTED NOT DETECTED    Pseudomonas aeruginosa NOT DETECTED NOT DETECTED   Stenotrophomonas maltophilia NOT DETECTED NOT DETECTED   Candida albicans NOT DETECTED NOT DETECTED   Candida auris NOT DETECTED NOT DETECTED   Candida glabrata NOT DETECTED NOT DETECTED   Candida krusei NOT DETECTED NOT DETECTED   Candida parapsilosis NOT DETECTED NOT DETECTED   Candida tropicalis NOT DETECTED NOT DETECTED   Cryptococcus neoformans/gattii NOT DETECTED NOT DETECTED    Wynona Neat, PharmD, BCPS  04/23/2020  12:34 AM

## 2020-04-24 DIAGNOSIS — L03116 Cellulitis of left lower limb: Secondary | ICD-10-CM | POA: Diagnosis not present

## 2020-04-24 DIAGNOSIS — L03115 Cellulitis of right lower limb: Secondary | ICD-10-CM | POA: Diagnosis not present

## 2020-04-24 LAB — BASIC METABOLIC PANEL
Anion gap: 10 (ref 5–15)
BUN: 41 mg/dL — ABNORMAL HIGH (ref 8–23)
CO2: 20 mmol/L — ABNORMAL LOW (ref 22–32)
Calcium: 7.9 mg/dL — ABNORMAL LOW (ref 8.9–10.3)
Chloride: 111 mmol/L (ref 98–111)
Creatinine, Ser: 2.56 mg/dL — ABNORMAL HIGH (ref 0.44–1.00)
GFR, Estimated: 19 mL/min — ABNORMAL LOW (ref >60)
Glucose, Bld: 129 mg/dL — ABNORMAL HIGH (ref 70–99)
Potassium: 4 mmol/L (ref 3.5–5.1)
Sodium: 141 mmol/L (ref 135–145)

## 2020-04-24 LAB — CBC WITH DIFFERENTIAL/PLATELET
Abs Immature Granulocytes: 0.01 10*3/uL (ref 0.00–0.07)
Basophils Absolute: 0 10*3/uL (ref 0.0–0.1)
Basophils Relative: 1 %
Eosinophils Absolute: 0.2 10*3/uL (ref 0.0–0.5)
Eosinophils Relative: 4 %
HCT: 25.8 % — ABNORMAL LOW (ref 36.0–46.0)
Hemoglobin: 7.9 g/dL — ABNORMAL LOW (ref 12.0–15.0)
Immature Granulocytes: 0 %
Lymphocytes Relative: 14 %
Lymphs Abs: 0.6 10*3/uL — ABNORMAL LOW (ref 0.7–4.0)
MCH: 27.2 pg (ref 26.0–34.0)
MCHC: 30.6 g/dL (ref 30.0–36.0)
MCV: 89 fL (ref 80.0–100.0)
Monocytes Absolute: 0.4 10*3/uL (ref 0.1–1.0)
Monocytes Relative: 10 %
Neutro Abs: 3 10*3/uL (ref 1.7–7.7)
Neutrophils Relative %: 71 %
Platelets: 224 10*3/uL (ref 150–400)
RBC: 2.9 MIL/uL — ABNORMAL LOW (ref 3.87–5.11)
RDW: 17.4 % — ABNORMAL HIGH (ref 11.5–15.5)
WBC: 4.3 10*3/uL (ref 4.0–10.5)
nRBC: 0 % (ref 0.0–0.2)

## 2020-04-24 LAB — GLUCOSE, CAPILLARY
Glucose-Capillary: 127 mg/dL — ABNORMAL HIGH (ref 70–99)
Glucose-Capillary: 126 mg/dL — ABNORMAL HIGH (ref 70–99)
Glucose-Capillary: 127 mg/dL — ABNORMAL HIGH (ref 70–99)
Glucose-Capillary: 161 mg/dL — ABNORMAL HIGH (ref 70–99)
Glucose-Capillary: 155 mg/dL — ABNORMAL HIGH (ref 70–99)

## 2020-04-24 MED ORDER — SENNA 8.6 MG PO TABS: 1.0000 | ORAL_TABLET | Freq: Every evening | ORAL | Status: DC | PRN

## 2020-04-24 MED ORDER — VANCOMYCIN HCL IN DEXTROSE 1-5 GM/200ML-% IV SOLN
1000.0000 mg | INTRAVENOUS | Status: DC
  Administered 2020-04-25 – 2020-04-27 (×2): 1000 mg via INTRAVENOUS
  Filled 2020-04-24 (×3): qty 200

## 2020-04-24 NOTE — Progress Notes (Signed)
PROGRESS NOTE  Vanessa [name] MAU:[id number] DOB: 1956-02-02 DOA: 04/22/2020 PCP: Pcp, No  Brief History   [name] is a 64 yr old woman who presented to [facility] on 04/21/2020 with complaints of foul drainage and pain in her lower extremities. These wounds are chronic and as per the patient's report had been "healed up" at one point. However in the past week she has had onset of erythema, purulent drainage and increased pain.  The patient has a past medical history significant for hypertension, CKD III, DM II, and morbid obesity with BMI of 47.7. She has recently been diagnosed with endometrial cancer.   The patient denies fevers, chills, shortness of breath, or abdominal pain.  [facility] were consulted to admit the patient for further evaluation and treatment. Wound care has been consulted.  The patient is receiving IV Vancomycin Flagyl, and rocephin. She is admitted to a telemetry bed. Wound care has been consulted.  Consultants  . None  Procedures  . None  Antibiotics   Anti-infectives (From admission, onward)   Start     Dose/Rate Route Frequency Ordered Stop   04/25/20 1239  vancomycin (VANCOCIN) IVPB 1000 mg/200 mL premix        1,000 mg 200 mL/hr over 60 Minutes Intravenous Every 48 hours 04/24/20 0914     04/23/20 1000  vancomycin (VANCOCIN) IVPB 1000 mg/200 mL premix  Status:  Discontinued        1,000 mg 200 mL/hr over 60 Minutes Intravenous Every 24 hours 04/22/20 0436 04/24/20 0914   04/22/20 1000  cefTRIAXone (ROCEPHIN) 2 g in sodium chloride 0.9 % 100 mL IVPB        2 g 200 mL/hr over 30 Minutes Intravenous Every 24 hours 04/22/20 0423     04/22/20 0800  metroNIDAZOLE (FLAGYL) IVPB 500 mg        500 mg 100 mL/hr over 60 Minutes Intravenous Every 8 hours 04/22/20 0423     04/22/20 0330  vancomycin (VANCOREADY) IVPB 1500 mg/300 mL        1,500 mg 150 mL/hr over 120 Minutes Intravenous  Once 04/22/20 0308 04/22/20 0721   04/22/20 0315   piperacillin-tazobactam (ZOSYN) IVPB 3.375 g        3.375 g 100 mL/hr over 30 Minutes Intravenous  Once 04/22/20 0308 04/22/20 0344     .   Interval History/Subjective  The patient is sitting up at bedside. No new complaints.  Objective   Vitals:  Vitals:   04/24/20 1255 04/24/20 1538  BP: (!) 186/76 (!) 153/81  Pulse: 65 68  Resp:  17  Temp:  97.8 F (36.6 C)  SpO2: 100% 95%    Exam:  Constitutional:  . The patient is awake, alert, and oriented x 3. No acute distress. Respiratory:  . No increased work of breathing. . No wheezes, rales, or rhonchi . No tactile fremitus Cardiovascular:  . Regular rate and rhythm . No murmurs, ectopy, or gallups. . No lateral PMI. No thrills. Abdomen:  . Abdomen is soft, non-tender, non-distended . No hernias, masses, or organomegaly . Normoactive bowel sounds.  Musculoskeletal:  . No cyanosis, clubbing, or edema Skin:  . No rashes . palpation of skin: no induration or nodules . Bilateral lower extremities with weeping chronic ulcerations and evidence of recent severe edema. Now with deep wrinkles. Neurologic:  . CN 2-12 intact . Sensation all 4 extremities intact Psychiatric:  . Mental status o Mood, affect appropriate o Orientation to person, place, time  .  judgment and insight appear intact   I have personally reviewed the following:   Today's Data  . Vitals, BMP, CBC .  Micro Data  . Blood cultures 04/22/2020: 1/2 positive for a coagulase negative staph.  Scheduled Meds: . amLODipine  10 mg Oral Daily  . aspirin  81 mg Oral Daily  . carvedilol  6.25 mg Oral BID WC  . cloNIDine  0.2 mg Oral BID  . ferrous sulfate  325 mg Oral BID WC  . heparin injection (subcutaneous)  5,000 Units Subcutaneous Q8H  . hydrALAZINE  100 mg Oral Q8H  . hydrocerin   Topical Daily  . insulin aspart  0-5 Units Subcutaneous QHS  . insulin aspart  0-9 Units Subcutaneous TID WC  . multivitamin with minerals  1 tablet Oral Daily  .  nutrition supplement (JUVEN)  1 packet Oral BID BM  . polyethylene glycol  17 g Oral Daily  . Ensure Max Protein  11 oz Oral QHS   Continuous Infusions: . cefTRIAXone (ROCEPHIN)  IV 2 g (04/24/20 1125)  . metronidazole 500 mg (04/24/20 1635)  . [START ON 04/25/2020] vancomycin      Principal Problem:   Bilateral lower leg cellulitis Active Problems:   DM type 2 (diabetes mellitus, type 2) (HCC)   Non compliance w medication regimen   Benign hypertension with CKD (chronic kidney disease) stage III (HCC)   Endometrial cancer (HCC)   Iron deficiency anemia due to chronic blood loss   Cellulitis   LOS: 1 day   A & P  Acute bilateral cellulitis with chronic ulcerations: The patient is on antibiotics and wound care has been consulted. Edema is being successfully addressed with elevation.   Lower extremity edema: Resolving. The patient tells me that she was on a "fluid pill" at home, although I do not seen this on her medication reconciliation. Currently the patient is 1L negative in terms of fluid balance. Given elevated creatinine would not opt to start diuresis unless volume status demands it.  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 Baseline creatinine appears to be about 1.7, although it has been as high as 3.33 in the last 3 months. Monitor creatinine, electrolytes, and volume status. Will consult nephrology in the am.  DM II: The patient has not been compliant with insulin at home. However, her hemoglobin A1c was only 5.9. Will follow glucoses with FSBS and SSI.  Anemia: Fe deficiency anemia. Continue iron supplements as at home.  Endometrial cancer: As per oncology as outpatient.  Noncompliance with medical regimen: noted.  I have seen and examined this patient myself. I have spent 32 minutes in her evaluation and care.  DVT Prophylaxis: Heparin CODE STATUS: Full Code Family Communication: None available Disposition: Status is: Inpatient  Remains inpatient appropriate because:IV  treatments appropriate due to intensity of illness or inability to take PO   Dispo: The patient is from: Home              Anticipated d/c is to: Home              Anticipated d/c date is: 2 days              Patient currently is not medically stable to d/c.  Vanessa Beyl, DO [facility] Direct contact: see [email or web address]  7PM-7AM contact night coverage as above 04/24/2020, 6:37 PM  LOS: 0 days

## 2020-04-24 NOTE — Progress Notes (Signed)
Pharmacy Antibiotic Note  Vanessa [name] is a 64 y.o. female admitted on 04/22/2020 with bilateral lower extremity cellullitis/wound infection.  Pharmacy has been consulted for Vancomycin dosing. White blood cells are within normal limits and patient is afebrile. SCr is trending up 2.20>>2.42>>2.56 with estimated CrCl ~ 24 ml/min. Noted that in the past year SCr has fluctuated between 2 and 3.   Plan: Reduce Vancomycin to 1000 mg every 48 hours  Predicted trough around 15 with this regimen Monitor renal function and consider transition to Linezolid 600 mg PO BID if Scr continues to trend up Ceftriaxone/Flagyl per MD Monitor wounds and s/sx of infection   Temp (24hrs), Avg:97.9 F (36.6 C), Min:97.5 F (36.4 C), Max:98.3 F (36.8 C)  Recent Labs  Lab 04/22/20 0056 04/22/20 1000 04/23/20 0636 04/24/20 0016  WBC 4.1  --  4.1 4.3  CREATININE 2.20*  --  2.42* 2.56*  LATICACIDVEN 0.8 1.0  --   --     Estimated Creatinine Clearance: 23.8 mL/min (A) (by C-G formula based on SCr of 2.56 mg/dL (H)).    Allergies  Allergen Reactions  . Ace Inhibitors     Never prescribed but she has had angioedema  . Angiotensin Receptor Blockers     ARB never Rxed but PMH  of angioedema in March 2021   Jimmy Footman, PharmD, BCPS, [facility] Clinical Pharmacist Phone: [phone] 04/24/2020 9:18 AM

## 2020-04-25 DIAGNOSIS — L03115 Cellulitis of right lower limb: Secondary | ICD-10-CM | POA: Diagnosis not present

## 2020-04-25 DIAGNOSIS — L03116 Cellulitis of left lower limb: Secondary | ICD-10-CM | POA: Diagnosis not present

## 2020-04-25 LAB — GLUCOSE, CAPILLARY
Glucose-Capillary: 114 mg/dL — ABNORMAL HIGH (ref 70–99)
Glucose-Capillary: 118 mg/dL — ABNORMAL HIGH (ref 70–99)
Glucose-Capillary: 142 mg/dL — ABNORMAL HIGH (ref 70–99)
Glucose-Capillary: 119 mg/dL — ABNORMAL HIGH (ref 70–99)

## 2020-04-25 LAB — BASIC METABOLIC PANEL
Anion gap: 10 (ref 5–15)
BUN: 49 mg/dL — ABNORMAL HIGH (ref 8–23)
CO2: 20 mmol/L — ABNORMAL LOW (ref 22–32)
Calcium: 7.9 mg/dL — ABNORMAL LOW (ref 8.9–10.3)
Chloride: 112 mmol/L — ABNORMAL HIGH (ref 98–111)
Creatinine, Ser: 2.34 mg/dL — ABNORMAL HIGH (ref 0.44–1.00)
GFR, Estimated: 21 mL/min — ABNORMAL LOW (ref >60)
Glucose, Bld: 160 mg/dL — ABNORMAL HIGH (ref 70–99)
Potassium: 4 mmol/L (ref 3.5–5.1)
Sodium: 142 mmol/L (ref 135–145)

## 2020-04-25 MED ORDER — SODIUM CHLORIDE 0.9 % IV SOLN
INTRAVENOUS | Status: DC | PRN
  Administered 2020-04-25: 1000 mL via INTRAVENOUS

## 2020-04-25 NOTE — Progress Notes (Signed)
PROGRESS NOTE  Vanessa [name] QIH:[id number] DOB: 1955-12-18 DOA: 04/22/2020 PCP: Pcp, No  Brief History   [name] is a 64 yr old woman who presented to [facility] on 04/21/2020 with complaints of foul drainage and pain in her lower extremities. These wounds are chronic and as per the patient's report had been "healed up" at one point. However in the past week she has had onset of erythema, purulent drainage and increased pain.  The patient has a past medical history significant for hypertension, CKD III, DM II, and morbid obesity with BMI of 47.7. She has recently been diagnosed with endometrial cancer.   The patient denies fevers, chills, shortness of breath, or abdominal pain.  [facility] were consulted to admit the patient for further evaluation and treatment. Wound care has been consulted.  The patient is receiving IV Vancomycin Flagyl, and rocephin. She is admitted to a telemetry bed. Wound care has been consulted.  Consultants  . None  Procedures  . None  Antibiotics   Anti-infectives (From admission, onward)   Start     Dose/Rate Route Frequency Ordered Stop   04/25/20 1239  vancomycin (VANCOCIN) IVPB 1000 mg/200 mL premix        1,000 mg 200 mL/hr over 60 Minutes Intravenous Every 48 hours 04/24/20 0914     04/23/20 1000  vancomycin (VANCOCIN) IVPB 1000 mg/200 mL premix  Status:  Discontinued        1,000 mg 200 mL/hr over 60 Minutes Intravenous Every 24 hours 04/22/20 0436 04/24/20 0914   04/22/20 1000  cefTRIAXone (ROCEPHIN) 2 g in sodium chloride 0.9 % 100 mL IVPB        2 g 200 mL/hr over 30 Minutes Intravenous Every 24 hours 04/22/20 0423     04/22/20 0800  metroNIDAZOLE (FLAGYL) IVPB 500 mg        500 mg 100 mL/hr over 60 Minutes Intravenous Every 8 hours 04/22/20 0423     04/22/20 0330  vancomycin (VANCOREADY) IVPB 1500 mg/300 mL        1,500 mg 150 mL/hr over 120 Minutes Intravenous  Once 04/22/20 0308 04/22/20 0721   04/22/20 0315   piperacillin-tazobactam (ZOSYN) IVPB 3.375 g        3.375 g 100 mL/hr over 30 Minutes Intravenous  Once 04/22/20 0308 04/22/20 0344     .   Interval History/Subjective  The patient is sitting up at bedside. No new complaints.  Objective   Vitals:  Vitals:   04/24/20 2025 04/25/20 0503  BP: (!) 162/72 (!) 170/84  Pulse: 66 68  Resp: 15 19  Temp: 97.9 F (36.6 C) 98.5 F (36.9 C)  SpO2: 96% 100%    Exam:  Constitutional:  . The patient is awake, alert, and oriented x 3. No acute distress. Respiratory:  . No increased work of breathing. . No wheezes, rales, or rhonchi . No tactile fremitus Cardiovascular:  . Regular rate and rhythm . No murmurs, ectopy, or gallups. . No lateral PMI. No thrills. Abdomen:  . Abdomen is soft, non-tender, non-distended . No hernias, masses, or organomegaly . Normoactive bowel sounds.  Musculoskeletal:  . No cyanosis, clubbing, or edema Skin:  . No rashes . palpation of skin: no induration or nodules . Bilateral lower extremities with weeping chronic ulcerations and evidence of recent severe edema. Now with deep wrinkles. Neurologic:  . CN 2-12 intact . Sensation all 4 extremities intact Psychiatric:  . Mental status o Mood, affect appropriate o Orientation to person,  place, time  . judgment and insight appear intact   I have personally reviewed the following:   Today's Data  . Vitals, BMP, CBC .  Micro Data  . Blood cultures 04/22/2020: 1/2 positive for a coagulase negative staph. Assumed to be a contaminant. Blood cultures repeated. These have had no growth.  Scheduled Meds: . amLODipine  10 mg Oral Daily  . aspirin  81 mg Oral Daily  . carvedilol  6.25 mg Oral BID WC  . cloNIDine  0.2 mg Oral BID  . ferrous sulfate  325 mg Oral BID WC  . heparin injection (subcutaneous)  5,000 Units Subcutaneous Q8H  . hydrALAZINE  100 mg Oral Q8H  . hydrocerin   Topical Daily  . insulin aspart  0-5 Units Subcutaneous QHS  .  insulin aspart  0-9 Units Subcutaneous TID WC  . multivitamin with minerals  1 tablet Oral Daily  . nutrition supplement (JUVEN)  1 packet Oral BID BM  . polyethylene glycol  17 g Oral Daily  . Ensure Max Protein  11 oz Oral QHS   Continuous Infusions: . sodium chloride 10 mL/hr at 04/25/20 1616  . cefTRIAXone (ROCEPHIN)  IV 2 g (04/25/20 1253)  . metronidazole 500 mg (04/25/20 1009)  . vancomycin Stopped (04/25/20 1612)    Principal Problem:   Bilateral lower leg cellulitis Active Problems:   DM type 2 (diabetes mellitus, type 2) (HCC)   Non compliance w medication regimen   Benign hypertension with CKD (chronic kidney disease) stage III (HCC)   Endometrial cancer (HCC)   Iron deficiency anemia due to chronic blood loss   Cellulitis   LOS: 2 days   A & P  Acute bilateral cellulitis with chronic ulcerations: The patient is on antibiotics and wound care has been consulted. Edema is being successfully addressed with elevation.   Lower extremity edema: Resolving. The patient tells me that she was on a "fluid pill" at home, although I do not seen this on her medication reconciliation. Currently the patient is 1L negative in terms of fluid balance. Given elevated creatinine would not opt to start diuresis unless volume status demands it.  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 Baseline creatinine appears to be about 1.7, although it has been as high as 3.33 in the last 3 months. Monitor creatinine, electrolytes, and volume status. Will consult nephrology in the am.  DM II: The patient has not been compliant with insulin at home. However, her hemoglobin A1c was only 5.9. Will follow glucoses with FSBS and SSI.  Anemia: Fe deficiency anemia. Continue iron supplements as at home.  Endometrial cancer: As per oncology as outpatient.  Noncompliance with medical regimen: noted.  Debility: PT/OT consulted to evaluate the patient and make recommendations for disposition.  I have seen and examined  this patient myself. I have spent 32 minutes in her evaluation and care.  DVT Prophylaxis: Heparin CODE STATUS: Full Code Family Communication: None available Disposition: Status is: Inpatient  Remains inpatient appropriate because:IV treatments appropriate due to intensity of illness or inability to take PO   Dispo: The patient is from: Home              Anticipated d/c is to: Home              Anticipated d/c date is: 2 days              Patient currently is not medically stable to d/c.  Vanessa Ferraiolo, DO [facility] Direct contact: see  [email or web address]  7PM-7AM contact night coverage as above 04/25/2020, 5:53 PM  LOS: 0 days

## 2020-04-26 DIAGNOSIS — L03115 Cellulitis of right lower limb: Secondary | ICD-10-CM | POA: Diagnosis not present

## 2020-04-26 DIAGNOSIS — L03116 Cellulitis of left lower limb: Secondary | ICD-10-CM | POA: Diagnosis not present

## 2020-04-26 LAB — CBC WITH DIFFERENTIAL/PLATELET
Abs Immature Granulocytes: 0.01 10*3/uL (ref 0.00–0.07)
Basophils Absolute: 0.1 10*3/uL (ref 0.0–0.1)
Basophils Relative: 1 %
Eosinophils Absolute: 0.2 10*3/uL (ref 0.0–0.5)
Eosinophils Relative: 4 %
HCT: 27.5 % — ABNORMAL LOW (ref 36.0–46.0)
Hemoglobin: 8.3 g/dL — ABNORMAL LOW (ref 12.0–15.0)
Immature Granulocytes: 0 %
Lymphocytes Relative: 13 %
Lymphs Abs: 0.7 10*3/uL (ref 0.7–4.0)
MCH: 27.1 pg (ref 26.0–34.0)
MCHC: 30.2 g/dL (ref 30.0–36.0)
MCV: 89.9 fL (ref 80.0–100.0)
Monocytes Absolute: 0.4 10*3/uL (ref 0.1–1.0)
Monocytes Relative: 9 %
Neutro Abs: 3.6 10*3/uL (ref 1.7–7.7)
Neutrophils Relative %: 73 %
Platelets: 243 10*3/uL (ref 150–400)
RBC: 3.06 MIL/uL — ABNORMAL LOW (ref 3.87–5.11)
RDW: 17.8 % — ABNORMAL HIGH (ref 11.5–15.5)
WBC: 4.9 10*3/uL (ref 4.0–10.5)
nRBC: 0 % (ref 0.0–0.2)

## 2020-04-26 LAB — BASIC METABOLIC PANEL
Anion gap: 11 (ref 5–15)
BUN: 57 mg/dL — ABNORMAL HIGH (ref 8–23)
CO2: 21 mmol/L — ABNORMAL LOW (ref 22–32)
Calcium: 8.2 mg/dL — ABNORMAL LOW (ref 8.9–10.3)
Chloride: 110 mmol/L (ref 98–111)
Creatinine, Ser: 2.6 mg/dL — ABNORMAL HIGH (ref 0.44–1.00)
GFR, Estimated: 19 mL/min — ABNORMAL LOW (ref >60)
Glucose, Bld: 157 mg/dL — ABNORMAL HIGH (ref 70–99)
Potassium: 4 mmol/L (ref 3.5–5.1)
Sodium: 142 mmol/L (ref 135–145)

## 2020-04-26 LAB — GLUCOSE, CAPILLARY
Glucose-Capillary: 136 mg/dL — ABNORMAL HIGH (ref 70–99)
Glucose-Capillary: 133 mg/dL — ABNORMAL HIGH (ref 70–99)
Glucose-Capillary: 120 mg/dL — ABNORMAL HIGH (ref 70–99)
Glucose-Capillary: 152 mg/dL — ABNORMAL HIGH (ref 70–99)

## 2020-04-26 NOTE — NC FL2 (Signed)
[place] LEVEL OF CARE SCREENING TOOL     IDENTIFICATION  Patient Name: Vanessa [name] Birthdate: Mar 14, 1956 Sex: female Admission Date (Current Location): 04/22/2020  [facility] and [place] Number:  Herbalist and Address:         Provider Number: [id number]  Attending Physician Name and Address:  Karie Kirks, DO  Relative Name and Phone Number:  Patient requested that husband not be contacted.    Current Level of Care: Hospital Recommended Level of Care: [place] Prior Approval Number:    Date Approved/Denied:   PASRR Number: [id number]  Discharge Plan: SNF    Current Diagnoses: Patient Active Problem List   Diagnosis Date Noted  . Cellulitis 04/23/2020  . Bilateral lower leg cellulitis 04/22/2020  . Neurocognitive deficits 12/11/2019  . Edema 12/04/2019  . AKI (acute kidney injury) ([place]) 12/03/2019  . Hypothermia 11/26/2019  . CKD (chronic kidney disease) stage 3, GFR 30-59 ml/min (HCC)   . Hypernatremia   . COVID-19 virus infection   . Generalized weakness   . Hypertensive urgency   . Allergic angioedema 10/19/2019  . Anasarca 10/18/2019  . Angioedema 10/18/2019  . Hypertensive emergency 10/18/2019  . Trichimoniasis 10/17/2019  . Macrocytic anemia 10/05/2019  . Esophageal dysmotility 10/04/2019  . Pressure injury of skin 09/22/2019  . Endometrial cancer ([place]) 09/21/2019  . Left humeral fracture 09/21/2019  . Iron deficiency anemia due to chronic blood loss 09/21/2019  . MSSA bacteremia 09/18/2019  . Postmenopausal bleeding 09/15/2019  . Thrombocytopenia ([place]) 09/15/2019  . Generalized pruritus 09/15/2019  . Benign hypertension with CKD (chronic kidney disease) stage III ([place]) 09/15/2019  . Hyperuricemia 09/15/2019  . Hypokalemia 09/15/2019  . Symptomatic anemia 09/14/2019  . Non compliance w medication regimen 03/24/2015  . Dyslipidemia 03/24/2015  . DM type 2 (diabetes mellitus, type 2) ([place]) 05/23/2014   . HTN (hypertension) 05/23/2014    Orientation RESPIRATION BLADDER Height & Weight     Self, Time, Situation, Place  Normal Continent Weight: 238 lb 1.6 oz (108 kg) Height:  4\' 11"  (149.9 cm)  BEHAVIORAL SYMPTOMS/MOOD NEUROLOGICAL BOWEL NUTRITION STATUS      Continent    AMBULATORY STATUS COMMUNICATION OF NEEDS Skin   Extensive Assist Verbally Other (Comment) (chronic wounds)                       Personal Care Assistance Level of Assistance  Bathing, Dressing, Feeding Bathing Assistance: Maximum assistance Feeding assistance: Independent Dressing Assistance: Maximum assistance     Functional Limitations Info             SPECIAL CARE FACTORS FREQUENCY  PT (By licensed PT), OT (By licensed OT)     PT Frequency: 5x weekly OT Frequency: 5x weekly            Contractures Contractures Info: Not present    Additional Factors Info  Code Status, Allergies Code Status Info: Full code Allergies Info: Ace inhibitors, Angiotensin Receptor Blockers           Current Medications (04/26/2020):  This is the current hospital active medication list Current Facility-Administered Medications  Medication Dose Route Frequency Provider Last Rate Last Admin  . 0.9 %  sodium chloride infusion   Intravenous PRN Swayze, Ava, DO 10 mL/hr at 04/26/20 1300 Rate Verify at 04/26/20 1300  . acetaminophen (TYLENOL) tablet 650 mg  650 mg Oral Q6H PRN Etta Quill, DO   650 mg at 04/26/20 1020   Or  .  acetaminophen (TYLENOL) suppository 650 mg  650 mg Rectal Q6H PRN Etta Quill, DO      . amLODipine (NORVASC) tablet 10 mg  10 mg Oral Daily Swayze, Ava, DO   10 mg at 04/26/20 0828  . aspirin chewable tablet 81 mg  81 mg Oral Daily Etta Quill, DO   81 mg at 04/26/20 1829  . carvedilol (COREG) tablet 6.25 mg  6.25 mg Oral BID WC Petrucelli, Samantha R, PA-C   6.25 mg at 04/26/20 0828  . cefTRIAXone (ROCEPHIN) 2 g in sodium chloride 0.9 % 100 mL IVPB  2 g Intravenous Q24H  Jennette Kettle M, DO 200 mL/hr at 04/26/20 1045 2 g at 04/26/20 1045  . cloNIDine (CATAPRES) tablet 0.2 mg  0.2 mg Oral BID Jennette Kettle M, DO   0.2 mg at 04/26/20 9371  . ferrous sulfate tablet 325 mg  325 mg Oral BID WC Swayze, Ava, DO   325 mg at 04/26/20 0828  . heparin injection 5,000 Units  5,000 Units Subcutaneous Q8H Swayze, Ava, DO   5,000 Units at 04/26/20 1311  . hydrALAZINE (APRESOLINE) tablet 100 mg  100 mg Oral Q8H Swayze, Ava, DO   100 mg at 04/26/20 1311  . hydrocerin (EUCERIN) cream   Topical Daily Swayze, Ava, DO   Given at 04/25/20 1012  . insulin aspart (novoLOG) injection 0-5 Units  0-5 Units Subcutaneous QHS Alcario Drought, Jared M, DO      . insulin aspart (novoLOG) injection 0-9 Units  0-9 Units Subcutaneous TID WC Etta Quill, DO   1 Units at 04/26/20 1311  . labetalol (NORMODYNE) injection 10-20 mg  10-20 mg Intravenous Q2H PRN Etta Quill, DO   20 mg at 04/23/20 0454  . metroNIDAZOLE (FLAGYL) IVPB 500 mg  500 mg Intravenous Q8H Jennette Kettle M, DO   Stopped at 04/26/20 1048  . multivitamin with minerals tablet 1 tablet  1 tablet Oral Daily Swayze, Ava, DO   1 tablet at 04/26/20 6967  . nutrition supplement (JUVEN) (JUVEN) powder packet 1 packet  1 packet Oral BID BM Blenda Nicely, RPH   1 packet at 04/26/20 1319  . ondansetron (ZOFRAN) tablet 4 mg  4 mg Oral Q6H PRN Etta Quill, DO       Or  . ondansetron [facility]) injection 4 mg  4 mg Intravenous Q6H PRN Etta Quill, DO      . polyethylene glycol (MIRALAX / GLYCOLAX) packet 17 g  17 g Oral Daily Swayze, Ava, DO   17 g at 04/26/20 0829  . protein supplement (ENSURE MAX) liquid  11 oz Oral QHS Swayze, Ava, DO   11 oz at 04/25/20 2106  . senna (SENOKOT) tablet 8.6 mg  1 tablet Oral QHS PRN Lang Snow, FNP      . vancomycin (VANCOCIN) IVPB 1000 mg/200 mL premix  1,000 mg Intravenous Q48H Susa Raring, Glynn   Stopped at 04/25/20 1612     Discharge Medications: Please see discharge summary for a  list of discharge medications.  Relevant Imaging Results:  Relevant Lab Results:   Additional Information SSN [id number]  Andria Frames, [place] Work

## 2020-04-26 NOTE — Progress Notes (Signed)
Blood pressure 175/75 due 1000am bp meds given

## 2020-04-26 NOTE — TOC Initial Note (Cosign Needed)
Transition of Care [facility]) - Initial/Assessment Note    Patient Details  Name: Vanessa [name] MRN: [id number] Date of Birth: 03/04/1956  Transition of Care [facility]) CM/SW Contact:    Andria Frames, [place] Work Phone Number: [phone] 04/26/2020, 3:01 PM  Clinical Narrative:                  MSW Intern met with patient to discuss SNF placement. Patient had been transferred to chair by staff and was getting ready to eat lunch.  Patient reported she was living at home with her husband but he is unable to take care of her because he has cancer and also because he has a new girlfriend. Patient stated she cannot return to her husband's apartment after she gets out of rehab and would like to transition to possibly an assisted living facility at [place] from rehab. MSW Intern will note patient's wishes but also advised patient to discuss this with the social worker at the rehab facility so that they can assist.  MSW intern facilitated discussion with patient about any other social supports; patient has a daughter, Vernie Shanks Winona Legato) [place] who lives in [place] and she believes her # is [phone]. Patient has 2 sons, Ladawna Walgren and Mikailah Morel, both of whom live in [place] but she doesn't have contact numbers as her husband stopped paying her phone bill. Patient stated she would like it if someone could try to locate her children and let them know what is going on.   Patient understands she needs to go to rehab and advised she does not want to go to [facility] or [facility]. MSW Intern provided medicare.gov SNF list.   MSW Intern notes patient stated specifically that she does not want her husband to have any information about her care or to be contacted for any reason.  MSW Intern attempted to contact patient's daughter and 2 sons at the following numbers and all were disconnected: [phone], [phone], [phone], [phone].    Expected Discharge Plan: Skilled Nursing  Facility Barriers to Discharge: Insurance Authorization, SNF Pending bed offer, Continued Medical Work up   Patient Goals and CMS Choice Patient states their goals for this hospitalization and ongoing recovery are:: to return to home CMS Medicare.gov Compare Post Acute Care list provided to:: Patient Choice offered to / list presented to : Patient  Expected Discharge Plan and Services Expected Discharge Plan: [place]   Discharge Planning Services: CM Consult Post Acute Care Choice: [place] arrangements for the past 2 months: Apartment (w/ husband but patient reports she wants to transition to assisted living facility placement after rehab.)                 DME Arranged: N/A         HH Arranged: RN [place]: [place] Date [place]: 04/22/20 Time [place]: [place] Representative spoke with at [place]: Tommi Rumps , patient will need resumption of care orders  Prior Living Arrangements/Services Living arrangements for the past 2 months: Apartment (w/ husband but patient reports she wants to transition to assisted living facility placement after rehab.) Lives with:: Spouse Patient language and need for interpreter reviewed:: Yes Do you feel safe going back to the place where you live?: No   Patient reports she can no longer stay with her husband due to his physical ailments and relationship issues. Patient wants to transition to an assisted living after rehab  Need for [facility] Participation  in Patient Care: No (Comment) (Patient wants to locate children but doesn't know how. Patient reports that she does not want her husband to have any information or be contacted about her care.) Care giver support system in place?: No (comment) (Daughter in [place], 2 sons in [place] Current home services: DME Criminal Activity/Legal Involvement Pertinent to Current Situation/Hospitalization: No - Comment as needed  Activities of Daily  Living Home Assistive Devices/Equipment: Cane (specify quad or straight), Walker (specify type) ADL Screening (condition at time of admission) Patient's cognitive ability adequate to safely complete daily activities?: Yes Is the patient deaf or have difficulty hearing?: No Does the patient have difficulty seeing, even when wearing glasses/contacts?: No Does the patient have difficulty concentrating, remembering, or making decisions?: No Patient able to express need for assistance with ADLs?: Yes Does the patient have difficulty dressing or bathing?: No Independently performs ADLs?: Yes (appropriate for developmental age) Does the patient have difficulty walking or climbing stairs?: Yes Weakness of Legs: Both Weakness of Arms/Hands: None  Permission Sought/Granted Permission sought to share information with : Family Supports Permission granted to share information with : Yes, Verbal Permission Granted  Share Information with NAME: Mcneil Sober / Demaris Bertell Maria / Ashley Mariner     Permission granted to share info w Relationship: Daughter / Pandora Leiter / Son  Permission granted to share info w Contact Information: [phone], number provided is not in service; patient does not have a cell phone and can't remember any other numbers  Emotional Assessment Appearance:: Appears older than stated age Attitude/Demeanor/Rapport: Engaged Affect (typically observed): Accepting, Calm Orientation: : Oriented to Self, Oriented to Place, Oriented to  Time, Oriented to Situation Alcohol / Substance Use: Not Applicable Psych Involvement: No (comment)  Admission diagnosis:  Bilateral lower leg cellulitis [L03.116, L03.115] Cellulitis of both lower extremities [L03.115, L03.116] Hypertension, unspecified type [I10] Cellulitis [L03.90] Patient Active Problem List   Diagnosis Date Noted  . Cellulitis 04/23/2020  . Bilateral lower leg cellulitis 04/22/2020  . Neurocognitive deficits 12/11/2019  . Edema  12/04/2019  . AKI (acute kidney injury) ([place]) 12/03/2019  . Hypothermia 11/26/2019  . CKD (chronic kidney disease) stage 3, GFR 30-59 ml/min (HCC)   . Hypernatremia   . COVID-19 virus infection   . Generalized weakness   . Hypertensive urgency   . Allergic angioedema 10/19/2019  . Anasarca 10/18/2019  . Angioedema 10/18/2019  . Hypertensive emergency 10/18/2019  . Trichimoniasis 10/17/2019  . Macrocytic anemia 10/05/2019  . Esophageal dysmotility 10/04/2019  . Pressure injury of skin 09/22/2019  . Endometrial cancer ([place]) 09/21/2019  . Left humeral fracture 09/21/2019  . Iron deficiency anemia due to chronic blood loss 09/21/2019  . MSSA bacteremia 09/18/2019  . Postmenopausal bleeding 09/15/2019  . Thrombocytopenia ([place]) 09/15/2019  . Generalized pruritus 09/15/2019  . Benign hypertension with CKD (chronic kidney disease) stage III ([place]) 09/15/2019  . Hyperuricemia 09/15/2019  . Hypokalemia 09/15/2019  . Symptomatic anemia 09/14/2019  . Non compliance w medication regimen 03/24/2015  . Dyslipidemia 03/24/2015  . DM type 2 (diabetes mellitus, type 2) ([place]) 05/23/2014  . HTN (hypertension) 05/23/2014   PCP:  Merryl Hacker No Pharmacy:   [facility] [address] ([address]), [place] [address] [place] ([place])  [id number] Phone: [phone] Fax: [phone]     Social Determinants of Health (SDOH) Interventions    Readmission Risk Interventions Readmission Risk Prevention Plan 11/08/2019 10/21/2019 09/25/2019  Transportation Screening Complete Complete Complete  PCP or Specialist Appt within 3-5  Days - - Not Complete  Not Complete comments - - plan for SNF  [place] or [place] - - Complete  Social Work Consult for [place] Planning/Counseling - - Complete  Palliative Care Screening - - Not Applicable  Medication Review (RN Care Manager) Complete Referral to Pharmacy Referral to Pharmacy  PCP or Specialist appointment within 3-5 days of  discharge Complete Complete -  [place] or Home Care Consult Complete (No Data) -  SW Recovery Care/Counseling Consult Complete Complete -  Palliative Care Screening Not Applicable Not Applicable -  Skilled Nursing Facility Complete Complete -  Some recent data might be hidden

## 2020-04-26 NOTE — Progress Notes (Addendum)
PROGRESS NOTE  Vanessa [name] QZE:[id number] DOB: Jun 12, 1956 DOA: 04/22/2020 PCP: Pcp, No  Brief History   Vanessa [name] is a 64 yr old woman who presented to [facility] on 04/21/2020 with complaints of foul drainage and pain in her lower extremities. These wounds are chronic and as per the patient's report had been "healed up" at one point. However in the past week she has had onset of erythema, purulent drainage and increased pain.   The patient has a past medical history significant for hypertension, CKD III, DM II, and morbid obesity with BMI of 47.7. She has recently been diagnosed with endometrial cancer.    The patient denies fevers, chills, shortness of breath, or abdominal pain.   [facility] hospitalists were consulted to admit the patient for further evaluation and treatment. Wound care has been consulted.  The patient is receiving IV Vancomycin Flagyl, and rocephin. She is admitted to a telemetry bed. Wound care has been consulted.  This writer has attempted to reach the patient's children at the numbers that are available. None of them seem to be working numbers.  Consultants  None  Procedures  None  Antibiotics   Anti-infectives (From admission, onward)    Start     Dose/Rate Route Frequency Ordered Stop   04/25/20 1239  vancomycin (VANCOCIN) IVPB 1000 mg/200 mL premix        1,000 mg 200 mL/hr over 60 Minutes Intravenous Every 48 hours 04/24/20 0914     04/23/20 1000  vancomycin (VANCOCIN) IVPB 1000 mg/200 mL premix  Status:  Discontinued        1,000 mg 200 mL/hr over 60 Minutes Intravenous Every 24 hours 04/22/20 0436 04/24/20 0914   04/22/20 1000  cefTRIAXone (ROCEPHIN) 2 g in sodium chloride 0.9 % 100 mL IVPB        2 g 200 mL/hr over 30 Minutes Intravenous Every 24 hours 04/22/20 0423     04/22/20 0800  metroNIDAZOLE (FLAGYL) IVPB 500 mg        500 mg 100 mL/hr over 60 Minutes Intravenous Every 8 hours 04/22/20 0423     04/22/20 0330  vancomycin (VANCOREADY) IVPB  1500 mg/300 mL        1,500 mg 150 mL/hr over 120 Minutes Intravenous  Once 04/22/20 0308 04/22/20 0721   04/22/20 0315  piperacillin-tazobactam (ZOSYN) IVPB 3.375 g        3.375 g 100 mL/hr over 30 Minutes Intravenous  Once 04/22/20 0308 04/22/20 0344        Interval History/Subjective  The patient is sitting up at bedside. No new complaints.  Objective   Vitals:  Vitals:   04/26/20 0827 04/26/20 1400  BP: (!) 175/75 138/78  Pulse: 75 65  Resp:  18  Temp: 97.7 F (36.5 C) 98 F (36.7 C)  SpO2: 99% 100%    Exam:  Constitutional:  The patient is awake, alert, and oriented x 3. No acute distress. Respiratory:  No increased work of breathing. No wheezes, rales, or rhonchi No tactile fremitus Cardiovascular:  Regular rate and rhythm No murmurs, ectopy, or gallups. No lateral PMI. No thrills. Abdomen:  Abdomen is soft, non-tender, non-distended No hernias, masses, or organomegaly Normoactive bowel sounds.  Musculoskeletal:  No cyanosis, clubbing, or edema Skin:  No rashes palpation of skin: no induration or nodules Bilateral lower extremities with weeping chronic ulcerations and evidence of recent severe edema. Now with deep wrinkles. Neurologic:  CN 2-12 intact Sensation all 4 extremities intact Psychiatric:  Mental status  Mood, affect appropriate Orientation to person, place, time  judgment and insight appear intact   I have personally reviewed the following:   Today's Data  Vitals, BMP, CBC  Micro Data  Blood cultures 04/22/2020: 1/2 positive for a coagulase negative staph. Assumed to be a contaminant. Blood cultures repeated. These have had no growth.  Scheduled Meds:  amLODipine  10 mg Oral Daily   aspirin  81 mg Oral Daily   carvedilol  6.25 mg Oral BID WC   cloNIDine  0.2 mg Oral BID   ferrous sulfate  325 mg Oral BID WC   heparin injection (subcutaneous)  5,000 Units Subcutaneous Q8H   hydrALAZINE  100 mg Oral Q8H   hydrocerin   Topical  Daily   insulin aspart  0-5 Units Subcutaneous QHS   insulin aspart  0-9 Units Subcutaneous TID WC   multivitamin with minerals  1 tablet Oral Daily   nutrition supplement (JUVEN)  1 packet Oral BID BM   polyethylene glycol  17 g Oral Daily   Ensure Max Protein  11 oz Oral QHS   Continuous Infusions:  sodium chloride Stopped (04/26/20 1318)   cefTRIAXone (ROCEPHIN)  IV 2 g (04/26/20 1045)   metronidazole Stopped (04/26/20 1048)   vancomycin Stopped (04/25/20 1612)    Principal Problem:   Bilateral lower leg cellulitis Active Problems:   DM type 2 (diabetes mellitus, type 2) (HCC)   Non compliance w medication regimen   Benign hypertension with CKD (chronic kidney disease) stage III (HCC)   Endometrial cancer (HCC)   Iron deficiency anemia due to chronic blood loss   Cellulitis   LOS: 3 days   A & P  Acute bilateral cellulitis with chronic ulcerations: The patient is on antibiotics and wound care has been consulted. Edema is being successfully addressed with elevation.   Lower extremity edema: Resolving. The patient tells me that she was on a "fluid pill" at home, although I do not seen this on her medication reconciliation. Currently the patient is 1L negative in terms of fluid balance. Given elevated creatinine would not opt to start diuresis unless volume status demands it.  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 Baseline creatinine appears to be about 1.7, although it has been as high as 3.33 in the last 3 months. Monitor creatinine, electrolytes, and volume status. Will consult nephrology in the am.  DM II: The patient has not been compliant with insulin at home. However, her hemoglobin A1c was only 5.9. Will follow glucoses with FSBS and SSI.  Hypokalemia: Supplement and monitor.  Anemia: Fe deficiency anemia. Continue iron supplements as at home.  Endometrial cancer: As per oncology as outpatient.  Noncompliance with medical regimen: noted.  Debility: PT/OT consulted to  evaluate the patient and make recommendations for disposition. They have recommended SNF. TOC has been consulted.  I have seen and examined this patient myself. I have spent 30 minutes in her evaluation and care.  DVT Prophylaxis: Heparin CODE STATUS: Full Code Family Communication: None available Disposition: Status is: Inpatient  Remains inpatient appropriate because:IV treatments appropriate due to intensity of illness or inability to take PO   Dispo: The patient is from: Home              Anticipated d/c is to: Home              Anticipated d/c date is: 2 days              Patient currently is not medically  stable to d/c.  Lenita Peregrina, DO [facility] Direct contact: see [email or web address]  7PM-7AM contact night coverage as above 04/26/2020, 4:41 PM  LOS: 0 days

## 2020-04-26 NOTE — Evaluation (Signed)
Occupational Therapy Evaluation Patient Details Name: Vanessa [name] MRN: [id number] DOB: [birth year] Today's Date: 04/26/2020    History of Present Illness Vanessa [name] is a 64 yr old woman who presented to [address] [facility] on 04/21/2020 with complaints of foul drainage and pain in her lower extremities. These wounds are chronic and as per the patient's report had been "healed up" at one point. However in the past week she has had onset of erythema, purulent drainage and increased pain. She indicates that she doesn't have a recliner, has had trouble getting in and out of bed; has been spending near 24 hours/day in a regular chair, and her LEs have consistently been in a dependent position because she doesn't have the option to elevate them   Clinical Impression   PTA pt living with husband, requiring intermittent assist for ADLs and assist for IADLs. Pt reports her husband has lung cx and he gets fatigued with his own ADLs, and cannot assist her to the level in which she requires. She reports that she had been staying and sleeping in a regular chair 24/7 with her legs in a dependent position due to inability to get in/out of bed. At time of eval, pt able to complete sit <>stands with initial min A +2, but eventually able to progress to min guard. Pt completed slow, steady functional mobility with min A and +2 person for equipment to bathroom for toilet transfer. From there she engaged in lotion application to BLEs with mod A while on toilet. She then required total A for peri care due to need of maintaining BUE support. Pt with DOE 3/4 after stated activities, vitals stable. Due to de conditioning and current home situation, recommend SNF at d/c for continued progression of ADLs. Will continue to follow per POC listed below.    Follow Up Recommendations  SNF;Supervision/Assistance - 24 hour    Equipment Recommendations  Wheelchair (measurements OT);Wheelchair cushion (measurements OT);Hospital bed     Recommendations for Other Services       Precautions / Restrictions Precautions Precautions: Fall Restrictions Weight Bearing Restrictions: No      Mobility Bed Mobility               General bed mobility comments: up in chair, returned to chair per pt preference. Does not like to lie in bed  Transfers Overall transfer level: Needs assistance Equipment used: Rolling walker (2 wheeled) Transfers: Sit to/from Stand Sit to Stand: Min assist;+2 physical assistance;+2 safety/equipment;Min guard         General transfer comment: initial stand from recliner required min A +2 to power into standing and facilitate forward weight shift to maintain balance. Pt progressed to standing min guard from toilet    Balance Overall balance assessment: Needs assistance Sitting-balance support: Feet supported;Bilateral upper extremity supported Sitting balance-Leahy Scale: Fair     Standing balance support: Bilateral upper extremity supported;During functional activity Standing balance-Leahy Scale: Poor Standing balance comment: reliant on bil BUEs. Pt needing to lean forearms on counter with grooming tasks and must hang onto walker making her ability to engage in posterior peri care difficult                           ADL either performed or assessed with clinical judgement   ADL Overall ADL's : Needs assistance/impaired Eating/Feeding: Set up;Sitting   Grooming: Min guard;Sitting;Wash/dry hands Grooming Details (indicate cue type and reason): pt needing to lean on elbows to wash  hands Upper Body Bathing: Set up;Sitting   Lower Body Bathing: Moderate assistance;Sitting/lateral leans;Sit to/from stand Lower Body Bathing Details (indicate cue type and reason): difficulty reaching past knees and maintaining balance when standing. SImulated with lotion application Upper Body Dressing : Minimal assistance;Sitting   Lower Body Dressing: Maximal assistance;Sitting/lateral  leans;Sit to/from stand Lower Body Dressing Details (indicate cue type and reason): reports always staing barefoot due to inability to don shoes/socks Toilet Transfer: Moderate assistance;Regular Toilet;Grab bars;RW Armed forces technical officer Details (indicate cue type and reason): mod A to power and steady from low surface Toileting- Clothing Manipulation and Hygiene: Total assistance;Sit to/from stand Toileting - Clothing Manipulation Details (indicate cue type and reason): pt reliant on BUE external support     Functional mobility during ADLs: Min guard;Rolling walker       Vision Patient Visual Report: No change from baseline       Perception     Praxis      Pertinent Vitals/Pain Pain Assessment: 0-10 Pain Score: 8  Pain Location: BLE legs Pain Descriptors / Indicators: Aching;Sore Pain Intervention(s): Limited activity within patient's tolerance;Monitored during session;Repositioned     Hand Dominance     Extremity/Trunk Assessment Upper Extremity Assessment Upper Extremity Assessment: Generalized weakness   Lower Extremity Assessment Lower Extremity Assessment: Generalized weakness       Communication Communication Communication: No difficulties   Cognition Arousal/Alertness: Awake/alert Behavior During Therapy: WFL for tasks assessed/performed Overall Cognitive Status: Within Functional Limits for tasks assessed                                 General Comments: pt aware that caring for herself at home is more difficult and that she needs higher level of care   General Comments       Exercises     Shoulder Instructions      Home Living Family/patient expects to be discharged to:: Private residence Living Arrangements: Spouse/significant other Available Help at Discharge: Family;Available PRN/intermittently Type of Home: Apartment Home Access: Level entry     Home Layout: One level     Bathroom Shower/Tub: Teacher, early years/pre:  Handicapped height     Home Equipment: Environmental consultant - 2 wheels;Cane - single point   Additional Comments: Reports husband cannot assist her at home as he has cancer. States he can "get mean" at times      Prior Functioning/Environment Level of Independence: Needs assistance  Gait / Transfers Assistance Needed: uses cane vs RW at baseline. Sits and sleeps in chair throuhgout day (not recliner, has increased swelling) ADL's / Homemaking Assistance Needed: does sink baths, able to get up and walk to bathroom, requires assist for IADLs            OT Problem List: Decreased strength;Decreased knowledge of use of DME or AE;Increased edema;Obesity;Decreased knowledge of precautions;Decreased activity tolerance;Impaired balance (sitting and/or standing);Pain      OT Treatment/Interventions: Self-care/ADL training;Therapeutic exercise;Patient/family education;Balance training;Energy conservation;Therapeutic activities;DME and/or AE instruction    OT Goals(Current goals can be found in the care plan section) Acute Rehab OT Goals Patient Stated Goal: go to SNF for continued care OT Goal Formulation: With patient Time For Goal Achievement: 05/10/20 Potential to Achieve Goals: Good  OT Frequency: Min 2X/week   Barriers to D/C: Decreased caregiver support  Reports husband has lung cx and gets winded with his ADLs and cannot assist her to level in which she requires  Co-evaluation PT/OT/SLP Co-Evaluation/Treatment: Yes Reason for Co-Treatment: For patient/therapist safety;To address functional/ADL transfers   OT goals addressed during session: ADL's and self-care;Proper use of Adaptive equipment and DME      AM-PAC OT "6 Clicks" Daily Activity     Outcome Measure Help from another person eating meals?: A Little Help from another person taking care of personal grooming?: A Little Help from another person toileting, which includes using toliet, bedpan, or urinal?: A Lot Help from another  person bathing (including washing, rinsing, drying)?: A Lot Help from another person to put on and taking off regular upper body clothing?: A Little Help from another person to put on and taking off regular lower body clothing?: A Lot 6 Click Score: 15   End of Session Equipment Utilized During Treatment: Gait belt;Rolling walker Nurse Communication: Mobility status  Activity Tolerance: Patient tolerated treatment well Patient left: in chair;with call bell/phone within reach  OT Visit Diagnosis: Unsteadiness on feet (R26.81);Other abnormalities of gait and mobility (R26.89);Muscle weakness (generalized) (M62.81);History of falling (Z91.81);Pain Pain - Right/Left:  (bil) Pain - part of body: Leg                Time: 3818-4037 OT Time Calculation (min): 46 min Charges:  OT General Charges $OT Visit: 1 Visit OT Evaluation $OT Eval Moderate Complexity: 1 Mod OT Treatments $Self Care/Home Management : 8-22 mins  Zenovia Jarred, MSOT, OTR/L Acute Rehabilitation Services [facility] Office Number: [phone] Pager: [phone]  Zenovia Jarred 04/26/2020, 1:30 PM

## 2020-04-26 NOTE — Evaluation (Signed)
Physical Therapy Evaluation Patient Details Name: Vanessa [name] MRN: [id number] DOB: [birth year] Today's Date: 04/26/2020   History of Present Illness  Vanessa [name] is a 64 yr old woman who presented to [facility] on 04/21/2020 with complaints of foul drainage and pain in her lower extremities. These wounds are chronic and as per the patient's report had been "healed up" at one point. However in the past week she has had onset of erythema, purulent drainage and increased pain. She indicates that she doesn't have a recliner, has had trouble getting in and out of bed; has been spending near 24 hours/day in a refular chair, and her LEs have consistently been in a dependent position because she doesn't have the option to elevate them  Clinical Impression   Pt admitted with above diagnosis. Comes from home where she lives with her husband in a single level apartment; Has been staying in a chair because of difficulty getting in and out of the bed, and her husband is unable to provide assist; Presents to PT with decr functional mobility, inefficient movements, decr functional independence, with difficulty managing her own ADLs; At time of eval, pt able to complete sit <>stands with initial min A +2, but eventually able to progress to min guard. Pt completed slow, steady functional mobility with min A and +2 person for equipment to bathroom for toilet transfer; Pt currently with functional limitations due to the deficits listed below (see PT Problem List). Pt will benefit from skilled PT to increase their independence and safety with mobility to allow discharge to the venue listed below.       Follow Up Recommendations SNF    Equipment Recommendations  Rolling walker with 5" wheels;3in1 (PT);Other (comment) Vanessa [name])    Recommendations for Other Services OT consult (as rodered)     Precautions / Restrictions Precautions Precautions: Fall Restrictions Weight Bearing Restrictions: No      Mobility   Bed Mobility               General bed mobility comments: up in chair, returned to chair per pt preference. Does not like to lie in bed  Transfers Overall transfer level: Needs assistance Equipment used: Rolling walker (2 wheeled) Transfers: Sit to/from Stand Sit to Stand: Min assist;+2 physical assistance;+2 safety/equipment;Min guard         General transfer comment: initial stand from recliner required min A +2 to power into standing and facilitate forward weight shift to maintain balance. Pt progressed to standing min guard from toilet  Ambulation/Gait Ambulation/Gait assistance: Min guard;+2 safety/equipment Gait Distance (Feet): 20 Feet (recliner to bathroom and back to recliner) Assistive device: Rolling walker (2 wheeled) Gait Pattern/deviations: Decreased step length - right;Decreased step length - left;Trunk flexed Gait velocity: quite slow   General Gait Details: Very slow steps; hips flexed and heavy dependence on [name]; cues for [name] proximity and to self-monitor for activity tolerance  Stairs            Wheelchair Mobility    Modified Rankin (Stroke Patients Only)       Balance Overall balance assessment: Needs assistance Sitting-balance support: Feet supported;Bilateral upper extremity supported Sitting balance-Leahy Scale: Fair     Standing balance support: Bilateral upper extremity supported;During functional activity Standing balance-Leahy Scale: Poor Standing balance comment: reliant on bil BUEs. Pt needing to lean forearms on counter with grooming tasks and must hang onto walker making her ability to engage in posterior peri care difficult  Pertinent Vitals/Pain Pain Location: BLE legs Pain Descriptors / Indicators: Aching;Sore    Home Living Family/patient expects to be discharged to:: Private residence Living Arrangements: Spouse/significant other Available Help at Discharge: Family;Available  PRN/intermittently Type of Home: Apartment Home Access: Level entry     Home Layout: One level Home Equipment: Walker - 2 wheels;Cane - single point Additional Comments: Reports husband cannot assist her at home as he has cancer. States he can "get mean" at times    Prior Function Level of Independence: Needs assistance   Gait / Transfers Assistance Needed: uses cane vs [name] at baseline. Sits and sleeps in chair throuhgout day (not recliner, has increased swelling)  ADL's / Homemaking Assistance Needed: does sink baths, able to get up and walk to bathroom, requires assist for IADLs        Hand Dominance        Extremity/Trunk Assessment   Upper Extremity Assessment Upper Extremity Assessment: Defer to OT evaluation    Lower Extremity Assessment Lower Extremity Assessment: Generalized weakness;RLE deficits/detail;LLE deficits/detail RLE Deficits / Details: Lower leg wounds dressed and wrapped in [place]; noting hip and knee ROM limited by body habitus LLE Deficits / Details: Lower leg wounds dressed and wrapped in Kerlix; noting hip and knee ROM limited by body habitus       Communication   Communication: No difficulties  Cognition Arousal/Alertness: Awake/alert Behavior During Therapy: WFL for tasks assessed/performed Overall Cognitive Status: Within Functional Limits for tasks assessed                                 General Comments: pt aware that caring for herself at home is more difficult and that she needs higher level of care      General Comments      Exercises     Assessment/Plan    PT Assessment Patient needs continued PT services  PT Problem List Decreased strength;Decreased range of motion;Decreased activity tolerance;Decreased balance;Decreased mobility;Decreased coordination;Decreased knowledge of use of DME;Decreased safety awareness;Decreased knowledge of precautions;Pain;Decreased skin integrity;Obesity       PT Treatment  Interventions DME instruction;Gait training;Functional mobility training;Therapeutic activities;Therapeutic exercise;Balance training;Neuromuscular re-education;Cognitive remediation;Patient/family education    PT Goals (Current goals can be found in the Care Plan section)  Acute Rehab PT Goals Patient Stated Goal: go to SNF for continued care PT Goal Formulation: With patient Time For Goal Achievement: 05/10/20 Potential to Achieve Goals: Good    Frequency Min 2X/week   Barriers to discharge        Co-evaluation   Reason for Co-Treatment: For patient/therapist safety;To address functional/ADL transfers   OT goals addressed during session: ADL's and self-care;Proper use of Adaptive equipment and DME       AM-PAC PT "6 Clicks" Mobility  Outcome Measure Help needed turning from your back to your side while in a flat bed without using bedrails?: A Little Help needed moving from lying on your back to sitting on the side of a flat bed without using bedrails?: A Lot Help needed moving to and from a bed to a chair (including a wheelchair)?: A Lot Help needed standing up from a chair using your arms (e.g., wheelchair or bedside chair)?: A Little Help needed to walk in hospital room?: A Little Help needed climbing 3-5 steps with a railing? : A Lot 6 Click Score: 15    End of Session Equipment Utilized During Treatment: Gait belt Activity Tolerance: Patient tolerated treatment  well Patient left: in chair;with call bell/phone within reach Nurse Communication: Mobility status PT Visit Diagnosis: Unsteadiness on feet (R26.81);Other abnormalities of gait and mobility (R26.89);Repeated falls (R29.6);Muscle weakness (generalized) (M62.81)    Time: 6803-2122 PT Time Calculation (min) (ACUTE ONLY): 51 min   Charges:   PT Evaluation $PT Eval Moderate Complexity: 1 Mod PT Treatments $Gait Training: 8-22 mins        Vanessa [name], Vanessa [name]  Acute Rehabilitation Services Pager  [phone] Office [phone]'  Vanessa [name] 04/26/2020, 5:16 PM

## 2020-04-27 DIAGNOSIS — L03116 Cellulitis of left lower limb: Secondary | ICD-10-CM | POA: Diagnosis not present

## 2020-04-27 DIAGNOSIS — L03115 Cellulitis of right lower limb: Secondary | ICD-10-CM | POA: Diagnosis not present

## 2020-04-27 LAB — CULTURE, BLOOD (ROUTINE X 2)
Culture: NO GROWTH
Special Requests: ADEQUATE

## 2020-04-27 LAB — GLUCOSE, CAPILLARY
Glucose-Capillary: 107 mg/dL — ABNORMAL HIGH (ref 70–99)
Glucose-Capillary: 140 mg/dL — ABNORMAL HIGH (ref 70–99)
Glucose-Capillary: 139 mg/dL — ABNORMAL HIGH (ref 70–99)
Glucose-Capillary: 143 mg/dL — ABNORMAL HIGH (ref 70–99)

## 2020-04-27 MED ORDER — LORATADINE 10 MG PO TABS: 10.0000 mg | ORAL_TABLET | Freq: Every day | ORAL | Status: DC

## 2020-04-27 MED ORDER — LORATADINE 10 MG PO TABS
10.0000 mg | ORAL_TABLET | Freq: Every day | ORAL | Status: DC
  Administered 2020-04-27 – 2020-04-30 (×5): 10 mg via ORAL
  Filled 2020-04-27 (×5): qty 1

## 2020-04-27 NOTE — TOC Progression Note (Addendum)
Transition of Care [facility]) - Progression Note    Patient Details  Name: Vanessa [name] MRN: [id number] Date of Birth: October 02, 1955  [facility] CM/SW Contact  Jacalyn Lefevre Edson Snowball, RN Phone Number: 04/27/2020, 1:45 PM  Clinical Narrative:    Provided bed offers. Will follow up with patient later this afternoon.   Patient had first [place] vaccination on September 12, 2019 , asking if she can get her second one here prior to discharge. Secure chatted MD. MD will arrange second covid vaccine to be given prior to discharge.  Expected Discharge Plan: Skilled Nursing Facility Barriers to Discharge: Ship broker, SNF Pending bed offer, Continued Medical Work up  Expected Discharge Plan and Services Expected Discharge Plan: [place]   Discharge Planning Services: CM Consult Post Acute Care Choice: [place] arrangements for the past 2 months: Apartment (w/ husband but patient reports she wants to transition to assisted living facility placement after rehab.)                 DME Arranged: N/A         HH Arranged: RN [place] Agency: [place] Date [place]: 04/22/20 Time [place]: [place] Representative spoke with at [place]: Tommi Rumps , patient will need resumption of care orders   Social Determinants of Health (SDOH) Interventions    Readmission Risk Interventions Readmission Risk Prevention Plan 11/08/2019 10/21/2019 09/25/2019  Transportation Screening Complete Complete Complete  PCP or Specialist Appt within 3-5 Days - - Not Complete  Not Complete comments - - plan for SNF  [place] or [place] - - Complete  Social Work Consult for [place] Planning/Counseling - - Complete  Palliative Care Screening - - Not Applicable  Medication Review Press photographer) Complete Referral to Pharmacy Referral to Pharmacy  PCP or Specialist appointment within 3-5 days of discharge Complete Complete -  HRI or Home Care  Consult Complete (No Data) -  SW Recovery Care/Counseling Consult Complete Complete -  Palliative Care Screening Not Applicable Not Applicable -  Skilled Nursing Facility Complete Complete -  Some recent data might be hidden

## 2020-04-27 NOTE — Progress Notes (Signed)
PROGRESS NOTE  Vanessa [name] IRJ:[id number] DOB: Nov 02, 1955 DOA: 04/22/2020 PCP: Pcp, No  Brief History   [name] is a 64 yr old woman who presented to [facility] on 04/21/2020 with complaints of foul drainage and pain in her lower extremities. These wounds are chronic and as per the patient's report had been "healed up" at one point. However in the past week she has had onset of erythema, purulent drainage and increased pain.   The patient has a past medical history significant for hypertension, CKD III, DM II, and morbid obesity with BMI of 47.7. She has recently been diagnosed with endometrial cancer.    The patient denies fevers, chills, shortness of breath, or abdominal pain.   [facility] were consulted to admit the patient for further evaluation and treatment. Wound care has been consulted.  The patient is receiving IV Vancomycin Flagyl, and rocephin. She is admitted to a telemetry bed. Wound care has been consulted.  This writer has attempted to reach the patient's children at the numbers that are available. None of them seem to be working numbers.  Plan is to discharge the patient to SNF.  Consultants  . None  Procedures  . None  Antibiotics   Anti-infectives (From admission, onward)   Start     Dose/Rate Route Frequency Ordered Stop   04/25/20 1239  vancomycin (VANCOCIN) IVPB 1000 mg/200 mL premix        1,000 mg 200 mL/hr over 60 Minutes Intravenous Every 48 hours 04/24/20 0914     04/23/20 1000  vancomycin (VANCOCIN) IVPB 1000 mg/200 mL premix  Status:  Discontinued        1,000 mg 200 mL/hr over 60 Minutes Intravenous Every 24 hours 04/22/20 0436 04/24/20 0914   04/22/20 1000  cefTRIAXone (ROCEPHIN) 2 g in sodium chloride 0.9 % 100 mL IVPB        2 g 200 mL/hr over 30 Minutes Intravenous Every 24 hours 04/22/20 0423     04/22/20 0800  metroNIDAZOLE (FLAGYL) IVPB 500 mg        500 mg 100 mL/hr over 60 Minutes Intravenous Every 8 hours 04/22/20 0423      04/22/20 0330  vancomycin (VANCOREADY) IVPB 1500 mg/300 mL        1,500 mg 150 mL/hr over 120 Minutes Intravenous  Once 04/22/20 0308 04/22/20 0721   04/22/20 0315  piperacillin-tazobactam (ZOSYN) IVPB 3.375 g        3.375 g 100 mL/hr over 30 Minutes Intravenous  Once 04/22/20 0308 04/22/20 0344     .   Interval History/Subjective  The patient is sitting up at bedside. No new complaints.  Objective   Vitals:  Vitals:   04/27/20 1032 04/27/20 1521  BP: (!) 186/83 (!) 147/68  Pulse: 76 70  Resp:  17  Temp:  (!) 97.4 F (36.3 C)  SpO2: 100% 100%    Exam:  Constitutional:  . The patient is awake, alert, and oriented x 3. No acute distress. Respiratory:  . No increased work of breathing. . No wheezes, rales, or rhonchi . No tactile fremitus Cardiovascular:  . Regular rate and rhythm . No murmurs, ectopy, or gallups. . No lateral PMI. No thrills. Abdomen:  . Abdomen is soft, non-tender, non-distended . No hernias, masses, or organomegaly . Normoactive bowel sounds.  Musculoskeletal:  . No cyanosis, clubbing, or edema Skin:  . No rashes . palpation of skin: no induration or nodules . Bilateral lower extremities with weeping chronic ulcerations and  evidence of recent severe edema. Now with deep wrinkles. Neurologic:  . CN 2-12 intact . Sensation all 4 extremities intact Psychiatric:  . Mental status o Mood, affect appropriate o Orientation to person, place, time  . judgment and insight appear intact   I have personally reviewed the following:   Today's Data  . Vitals, BMP, CBC .  Micro Data  . Blood cultures 04/22/2020: 1/2 positive for a coagulase negative staph. Assumed to be a contaminant. Blood cultures repeated. These have had no growth.  Scheduled Meds: . amLODipine  10 mg Oral Daily  . aspirin  81 mg Oral Daily  . carvedilol  6.25 mg Oral BID WC  . cloNIDine  0.2 mg Oral BID  . ferrous sulfate  325 mg Oral BID WC  . heparin injection  (subcutaneous)  5,000 Units Subcutaneous Q8H  . hydrALAZINE  100 mg Oral Q8H  . hydrocerin   Topical Daily  . insulin aspart  0-5 Units Subcutaneous QHS  . insulin aspart  0-9 Units Subcutaneous TID WC  . loratadine  10 mg Oral Daily  . multivitamin with minerals  1 tablet Oral Daily  . nutrition supplement (JUVEN)  1 packet Oral BID BM  . polyethylene glycol  17 g Oral Daily  . Ensure Max Protein  11 oz Oral QHS   Continuous Infusions: . sodium chloride Stopped (04/26/20 1318)  . cefTRIAXone (ROCEPHIN)  IV 2 g (04/27/20 1042)  . metronidazole 500 mg (04/27/20 0834)  . vancomycin 1,000 mg (04/27/20 1222)    Principal Problem:   Bilateral lower leg cellulitis Active Problems:   DM type 2 (diabetes mellitus, type 2) (HCC)   Non compliance w medication regimen   Benign hypertension with CKD (chronic kidney disease) stage III (HCC)   Endometrial cancer (HCC)   Iron deficiency anemia due to chronic blood loss   Cellulitis   LOS: 4 days   A & P  Acute bilateral cellulitis with chronic ulcerations: The patient is on antibiotics and wound care has been consulted. Edema is being successfully addressed with elevation.   Lower extremity edema: Resolving. The patient tells me that she was on a "fluid pill" at home, although I do not seen this on her medication reconciliation. Currently the patient is 1L negative in terms of fluid balance. Given elevated creatinine would not opt to start diuresis unless volume status demands it.  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 Baseline creatinine appears to be about 1.7, although it has been as high as 3.33 in the last 3 months. Monitor creatinine, electrolytes, and volume status. Will consult nephrology in the am.  DM II: The patient has not been compliant with insulin at home. However, her hemoglobin A1c was only 5.9. Will follow glucoses with FSBS and SSI.  Hypokalemia: Supplement and monitor.  Anemia: Fe deficiency anemia. Continue iron supplements as at  home.  Endometrial cancer: As per oncology as outpatient.  Noncompliance with medical regimen: noted.  Debility: PT/OT consulted to evaluate the patient and make recommendations for disposition. They have recommended SNF. TOC has been consulted.  I have seen and examined this patient myself. I have spent 30 minutes in her evaluation and care.  DVT Prophylaxis: Heparin CODE STATUS: Full Code Family Communication: None available Disposition: Status is: Inpatient  Remains inpatient appropriate because:IV treatments appropriate due to intensity of illness or inability to take PO   Dispo: The patient is from: Home  Anticipated d/c is to: Home              Anticipated d/c date is: 2 days              Patient currently is not medically stable to d/c.  Naftuli Dalsanto, DO [facility] Direct contact: see [email or web address]  7PM-7AM contact night coverage as above 04/27/2020, 4:04 PM  LOS: 0 days

## 2020-04-27 NOTE — Care Management Important Message (Signed)
Important Message  Patient Details  Name: Vanessa [name] MRN: [id number] Date of Birth: 02-17-1956   Medicare Important Message Given:  Yes     Coren Crownover Montine Circle 04/27/2020, 4:42 PM

## 2020-04-27 NOTE — Progress Notes (Signed)
Pharmacy Antibiotic Note  Vanessa [name] is a 64 y.o. female admitted on 04/22/2020 with bilateral lower extremity cellullitis/wound infection.  Pharmacy has been consulted for vancomycin dosing; also on ceftriaxone and Flagyl. Afebrile, WBC WNL. CKD noted, SCr is 2.56 (baseline ~1.7).   Plan: Vancomycin 1000 mg IV q48h Ceftriaxone/Flagyl per MD Monitor renal function, clinical progress, cultures/sensitivities F/U LOT and de-escalate as able Vancomycin levels as clinically indicated  Temp (24hrs), Avg:98 F (36.7 C), Min:97.5 F (36.4 C), Max:98.4 F (36.9 C)  Recent Labs  Lab 04/22/20 0056 04/22/20 1000 04/23/20 0636 04/24/20 0016 04/25/20 0120 04/26/20 0129  WBC 4.1  --  4.1 4.3  --  4.9  CREATININE 2.20*  --  2.42* 2.56* 2.34* 2.60*  LATICACIDVEN 0.8 1.0  --   --   --   --     Estimated Creatinine Clearance: 23.5 mL/min (A) (by C-G formula based on SCr of 2.6 mg/dL (H)).    Allergies  Allergen Reactions  . Ace Inhibitors     Never prescribed but she has had angioedema  . Angiotensin Receptor Blockers     ARB never Rxed but PMH  of angioedema in March 2021     Thank you for involving pharmacy in this patient's care.  Renold Genta, PharmD, BCPS Clinical Pharmacist Clinical phone for 04/27/2020 until 3p is [phone] 04/27/2020 11:28 AM  **Pharmacist phone directory can be found on [email or web address] listed under [place]**

## 2020-04-28 DIAGNOSIS — L03116 Cellulitis of left lower limb: Secondary | ICD-10-CM | POA: Diagnosis not present

## 2020-04-28 DIAGNOSIS — L03115 Cellulitis of right lower limb: Secondary | ICD-10-CM | POA: Diagnosis not present

## 2020-04-28 LAB — CULTURE, BLOOD (ROUTINE X 2)
Culture: NO GROWTH
Culture: NO GROWTH
Special Requests: ADEQUATE
Special Requests: ADEQUATE

## 2020-04-28 LAB — GLUCOSE, CAPILLARY
Glucose-Capillary: 105 mg/dL — ABNORMAL HIGH (ref 70–99)
Glucose-Capillary: 126 mg/dL — ABNORMAL HIGH (ref 70–99)
Glucose-Capillary: 133 mg/dL — ABNORMAL HIGH (ref 70–99)
Glucose-Capillary: 176 mg/dL — ABNORMAL HIGH (ref 70–99)

## 2020-04-28 MED ORDER — ADULT MULTIVITAMIN W/MINERALS CH
1.0000 | ORAL_TABLET | Freq: Every day | ORAL | 0 refills | Status: DC
Start: 2020-04-29 — End: 2020-08-08

## 2020-04-28 MED ORDER — AMLODIPINE BESYLATE 10 MG PO TABS
10.0000 mg | ORAL_TABLET | Freq: Every day | ORAL | 0 refills | Status: DC
Start: 2020-04-29 — End: 2020-08-18

## 2020-04-28 MED ORDER — JUVEN PO PACK
1.0000 | PACK | Freq: Two times a day (BID) | ORAL | 0 refills | Status: DC
Start: 2020-04-29 — End: 2020-10-14

## 2020-04-28 MED ORDER — HYDROCERIN EX CREA: 1.0000 "application " | TOPICAL_CREAM | Freq: Every day | CUTANEOUS | 0 refills | Status: AC

## 2020-04-28 MED ORDER — CEPHALEXIN 500 MG PO CAPS: 500.0000 mg | ORAL_CAPSULE | Freq: Four times a day (QID) | ORAL | 0 refills | Status: DC

## 2020-04-28 MED ORDER — DIPHENHYDRAMINE HCL 25 MG PO CAPS
25.0000 mg | ORAL_CAPSULE | Freq: Once | ORAL | Status: AC
  Administered 2020-04-28: 25 mg via ORAL
  Filled 2020-04-28: qty 1

## 2020-04-28 NOTE — Progress Notes (Signed)
PROGRESS NOTE  Vanessa [name] AST:[id number] DOB: 1955/10/25 DOA: 04/22/2020 PCP: Pcp, No  Brief History   Vanessa [name] is a 64 yr old woman who presented to [facility] on 04/21/2020 with complaints of foul drainage and pain in her lower extremities. These wounds are chronic and as per the patient's report had been "healed up" at one point. However in the past week she has had onset of erythema, purulent drainage and increased pain.   The patient has a past medical history significant for hypertension, CKD III, DM II, and morbid obesity with BMI of 47.7. She has recently been diagnosed with endometrial cancer.    The patient denies fevers, chills, shortness of breath, or abdominal pain.   [facility] were consulted to admit the patient for further evaluation and treatment. Wound care has been consulted.  The patient is receiving IV Vancomycin Flagyl, and rocephin. She is admitted to a telemetry bed. Wound care has been consulted.  This writer has attempted to reach the patient's children at the numbers that are available. None of them seem to be working numbers.  Plan is to discharge the patient to SNF. Bed is expected tomorrow.  Consultants  . None  Procedures  . None  Antibiotics   Anti-infectives (From admission, onward)   Start     Dose/Rate Route Frequency Ordered Stop   04/28/20 0000  cephALEXin (KEFLEX) 500 MG capsule        500 mg Oral 4 times daily 04/28/20 1800 05/05/20 2359   04/25/20 1239  vancomycin (VANCOCIN) IVPB 1000 mg/200 mL premix        1,000 mg 200 mL/hr over 60 Minutes Intravenous Every 48 hours 04/24/20 0914     04/23/20 1000  vancomycin (VANCOCIN) IVPB 1000 mg/200 mL premix  Status:  Discontinued        1,000 mg 200 mL/hr over 60 Minutes Intravenous Every 24 hours 04/22/20 0436 04/24/20 0914   04/22/20 1000  cefTRIAXone (ROCEPHIN) 2 g in sodium chloride 0.9 % 100 mL IVPB        2 g 200 mL/hr over 30 Minutes Intravenous Every 24 hours 04/22/20 0423      04/22/20 0800  metroNIDAZOLE (FLAGYL) IVPB 500 mg        500 mg 100 mL/hr over 60 Minutes Intravenous Every 8 hours 04/22/20 0423     04/22/20 0330  vancomycin (VANCOREADY) IVPB 1500 mg/300 mL        1,500 mg 150 mL/hr over 120 Minutes Intravenous  Once 04/22/20 0308 04/22/20 0721   04/22/20 0315  piperacillin-tazobactam (ZOSYN) IVPB 3.375 g        3.375 g 100 mL/hr over 30 Minutes Intravenous  Once 04/22/20 0308 04/22/20 0344     .   Interval History/Subjective  The patient is sitting up at bedside. No new complaints.  Objective   Vitals:  Vitals:   04/28/20 0358 04/28/20 1345  BP: (!) 160/73 (!) 153/75  Pulse: 69 70  Resp: 18   Temp: (!) 97.5 F (36.4 C)   SpO2: 95% 96%    Exam:  Constitutional:  . The patient is awake, alert, and oriented x 3. No acute distress. Respiratory:  . No increased work of breathing. . No wheezes, rales, or rhonchi . No tactile fremitus Cardiovascular:  . Regular rate and rhythm . No murmurs, ectopy, or gallups. . No lateral PMI. No thrills. Abdomen:  . Abdomen is soft, non-tender, non-distended . No hernias, masses, or organomegaly . Normoactive bowel sounds.  Musculoskeletal:  . No cyanosis, clubbing, or edema Skin:  . No rashes . palpation of skin: no induration or nodules . Bilateral lower extremities with weeping chronic ulcerations and evidence of recent severe edema. Now with deep wrinkles. Neurologic:  . CN 2-12 intact . Sensation all 4 extremities intact Psychiatric:  . Mental status o Mood, affect appropriate o Orientation to person, place, time  . judgment and insight appear intact   I have personally reviewed the following:   Today's Data  . Vitals, Glucoses .  Micro Data  . Blood cultures 04/22/2020: 1/2 positive for a coagulase negative staph. Assumed to be a contaminant. Blood cultures repeated. These have had no growth.  Scheduled Meds: . amLODipine  10 mg Oral Daily  . aspirin  81 mg Oral Daily    . carvedilol  6.25 mg Oral BID WC  . cloNIDine  0.2 mg Oral BID  . ferrous sulfate  325 mg Oral BID WC  . heparin injection (subcutaneous)  5,000 Units Subcutaneous Q8H  . hydrALAZINE  100 mg Oral Q8H  . hydrocerin   Topical Daily  . insulin aspart  0-5 Units Subcutaneous QHS  . insulin aspart  0-9 Units Subcutaneous TID WC  . loratadine  10 mg Oral Daily  . multivitamin with minerals  1 tablet Oral Daily  . nutrition supplement (JUVEN)  1 packet Oral BID BM  . polyethylene glycol  17 g Oral Daily  . Ensure Max Protein  11 oz Oral QHS   Continuous Infusions: . sodium chloride Stopped (04/26/20 1318)  . cefTRIAXone (ROCEPHIN)  IV 2 g (04/28/20 0943)  . metronidazole 500 mg (04/28/20 1556)  . vancomycin 1,000 mg (04/27/20 1222)    Principal Problem:   Bilateral lower leg cellulitis Active Problems:   DM type 2 (diabetes mellitus, type 2) (HCC)   Non compliance w medication regimen   Benign hypertension with CKD (chronic kidney disease) stage III (HCC)   Endometrial cancer (HCC)   Iron deficiency anemia due to chronic blood loss   Cellulitis   LOS: 5 days   A & P  Acute bilateral cellulitis with chronic ulcerations: The patient is on antibiotics and wound care has been consulted. Edema is being successfully addressed with elevation.   Lower extremity edema: Resolving. The patient tells me that she was on a "fluid pill" at home, although I do not seen this on her medication reconciliation. Currently the patient is 1L negative in terms of fluid balance. Given elevated creatinine would not opt to start diuresis unless volume status demands it.  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 Baseline creatinine appears to be about 1.7, although it has been as high as 3.33 in the last 3 months. Monitor creatinine, electrolytes, and volume status. Will consult nephrology in the am.  DM II: The patient has not been compliant with insulin at home. However, her hemoglobin A1c was only 5.9. Will follow  glucoses with FSBS and SSI.  Hypokalemia: Supplement and monitor.  Anemia: Fe deficiency anemia. Continue iron supplements as at home.  Endometrial cancer: As per oncology as outpatient.  Noncompliance with medical regimen: noted.  Debility: PT/OT consulted to evaluate the patient and make recommendations for disposition. They have recommended SNF. TOC has been consulted.  I have seen and examined this patient myself. I have spent 30 minutes in her evaluation and care.  DVT Prophylaxis: Heparin CODE STATUS: Full Code Family Communication: None available Disposition: Status is: Inpatient  Remains inpatient appropriate because:IV treatments appropriate due  to intensity of illness or inability to take PO   Dispo: The patient is from: Home              Anticipated d/c is to: SNF              Anticipated d/c date is: 1 Day              Patient currently is not medically stable to d/c. due to lack of safe discharge.  Chukwuemeka Artola, DO [facility] Direct contact: see [email or web address]  7PM-7AM contact night coverage as above 04/28/2020, 6:02 PM  LOS: 0 days

## 2020-04-28 NOTE — Care Management (Addendum)
Patient has decided on [facility] . NCM messaged [facility] to confirm they can still offer. Await call back. Will start insurance authorization.   Juliann Pulse at [facility] has accepted patient. Patient will need new covid. Messaged MD.   [employer] authorization. Clinicals faxed to [phone] . Ref number [id number]  Magdalen Spatz RN

## 2020-04-28 NOTE — Progress Notes (Signed)
Nutrition Follow-up  RD working remotely.  DOCUMENTATION CODES:   Morbid obesity  INTERVENTION:   -Continue 1 packet Juven BID, each packet provides 95 calories, 2.5 grams of protein (collagen), and 9.8 grams of carbohydrate (3 grams sugar); also contains 7 grams of L-arginine and L-glutamine, 300 mg vitamin C, 15 mg vitamin E, 1.2 mcg vitamin B-12, 9.5 mg zinc, 200 mg calcium, and 1.5 g  Calcium Beta-hydroxy-Beta-methylbutyrate to support wound healing -Continue Ensure Max po daily, each supplement provides 150 kcal and 30 grams of protein -Continue MVI with minerals daily  NUTRITION DIAGNOSIS:   Increased nutrient needs related to wound healing as evidenced by estimated needs.  Ongoing  GOAL:   Patient will meet greater than or equal to 90% of their needs  Progressing   MONITOR:   PO intake, Supplement acceptance, Labs, Weight trends, Skin, I & O's  REASON FOR ASSESSMENT:   Consult Assessment of nutrition requirement/status, Wound healing  ASSESSMENT:   Vanessa [name] is a 64 y.o. female with medical history significant of HTN, CKD stage 3, DM2.  Recent diagnosis of endometrial cancer.  Reviewed I/O's: +280 ml x 24 hours and -1.4 L since admission  UOP: 500 ml x 24 hours  Attempted to speak with pt via call to hospital room phone, however, no answer.   Pt with good appetite; noted meal completion 100%. She is taking Juven and Ensure Max supplements.   Medications reviewed and include miralax.   Per TOC notes, plan for SNF at discharge.   Labs reviewed: CBGS: 105-143 (inpatient orders for glycemic control are 0-9 units insulin aspart TID with meals).   Diet Order:   Diet Order            Diet Carb Modified Fluid consistency: Thin; Room service appropriate? Yes  Diet effective now                 EDUCATION NEEDS:   Education needs have been addressed  Skin:  Skin Assessment: Skin Integrity Issues: Skin Integrity Issues:: Other (Comment) Other:  bulbous lesions on bilateral LEs with epithelial tissue covering; full thickness areas of LLE  Last BM:  04/27/20  Height:   Ht Readings from Last 1 Encounters:  04/24/20 4\' 11"  (1.499 m)    Weight:   Wt Readings from Last 1 Encounters:  04/24/20 108 kg    Ideal Body Weight:  44.5 kg  BMI:  Body mass index is 48.09 kg/m.  Estimated Nutritional Needs:   Kcal:  1550-1750  Protein:  95-110 grams  Fluid:  > 1.5 L    [name], RD, LDN, [place] Registered Dietitian II Certified Diabetes Care and Education Specialist Please refer to [facility] for RD and/or RD on-call/weekend/after hours pager

## 2020-04-29 ENCOUNTER — Ambulatory Visit: Payer: Medicare Other

## 2020-04-29 ENCOUNTER — Inpatient Hospital Stay (HOSPITAL_COMMUNITY): Payer: Medicare Other

## 2020-04-29 ENCOUNTER — Inpatient Hospital Stay: Payer: Medicare Other

## 2020-04-29 ENCOUNTER — Ambulatory Visit: Payer: Medicare Other | Admitting: Radiation Oncology

## 2020-04-29 DIAGNOSIS — L03116 Cellulitis of left lower limb: Secondary | ICD-10-CM | POA: Diagnosis not present

## 2020-04-29 DIAGNOSIS — Z23 Encounter for immunization: Secondary | ICD-10-CM

## 2020-04-29 DIAGNOSIS — L03115 Cellulitis of right lower limb: Secondary | ICD-10-CM | POA: Diagnosis not present

## 2020-04-29 LAB — URINALYSIS, ROUTINE W REFLEX MICROSCOPIC
Bilirubin Urine: NEGATIVE
Glucose, UA: NEGATIVE mg/dL
Ketones, ur: NEGATIVE mg/dL
Nitrite: NEGATIVE
Protein, ur: 100 mg/dL — AB
RBC / HPF: >50 RBC/hpf — ABNORMAL HIGH (ref 0–5)
Specific Gravity, Urine: 1.017 (ref 1.005–1.030)
pH: 5 (ref 5.0–8.0)

## 2020-04-29 LAB — COMPREHENSIVE METABOLIC PANEL
ALT: 14 U/L (ref 0–44)
AST: 28 U/L (ref 15–41)
Albumin: 2.5 g/dL — ABNORMAL LOW (ref 3.5–5.0)
Alkaline Phosphatase: 99 U/L (ref 38–126)
Anion gap: 10 (ref 5–15)
BUN: 82 mg/dL — ABNORMAL HIGH (ref 8–23)
CO2: 19 mmol/L — ABNORMAL LOW (ref 22–32)
Calcium: 8.4 mg/dL — ABNORMAL LOW (ref 8.9–10.3)
Chloride: 113 mmol/L — ABNORMAL HIGH (ref 98–111)
Creatinine, Ser: 2.79 mg/dL — ABNORMAL HIGH (ref 0.44–1.00)
GFR, Estimated: 17 mL/min — ABNORMAL LOW (ref >60)
Glucose, Bld: 153 mg/dL — ABNORMAL HIGH (ref 70–99)
Potassium: 4.4 mmol/L (ref 3.5–5.1)
Sodium: 142 mmol/L (ref 135–145)
Total Bilirubin: 0.5 mg/dL (ref 0.3–1.2)
Total Protein: 6.9 g/dL (ref 6.5–8.1)

## 2020-04-29 LAB — RESPIRATORY PANEL BY RT PCR (FLU A&B, COVID)
Influenza A by PCR: NEGATIVE
Influenza B by PCR: NEGATIVE
SARS Coronavirus 2 by RT PCR: NEGATIVE

## 2020-04-29 LAB — CBC WITH DIFFERENTIAL/PLATELET
Abs Immature Granulocytes: 0.02 10*3/uL (ref 0.00–0.07)
Basophils Absolute: 0.1 10*3/uL (ref 0.0–0.1)
Basophils Relative: 2 %
Eosinophils Absolute: 0.2 10*3/uL (ref 0.0–0.5)
Eosinophils Relative: 5 %
HCT: 27.1 % — ABNORMAL LOW (ref 36.0–46.0)
Hemoglobin: 7.9 g/dL — ABNORMAL LOW (ref 12.0–15.0)
Immature Granulocytes: 1 %
Lymphocytes Relative: 12 %
Lymphs Abs: 0.5 10*3/uL — ABNORMAL LOW (ref 0.7–4.0)
MCH: 27.6 pg (ref 26.0–34.0)
MCHC: 29.2 g/dL — ABNORMAL LOW (ref 30.0–36.0)
MCV: 94.8 fL (ref 80.0–100.0)
Monocytes Absolute: 0.4 10*3/uL (ref 0.1–1.0)
Monocytes Relative: 9 %
Neutro Abs: 3.1 10*3/uL (ref 1.7–7.7)
Neutrophils Relative %: 71 %
Platelets: 258 10*3/uL (ref 150–400)
RBC: 2.86 MIL/uL — ABNORMAL LOW (ref 3.87–5.11)
RDW: 19.2 % — ABNORMAL HIGH (ref 11.5–15.5)
WBC: 4.3 10*3/uL (ref 4.0–10.5)
nRBC: 0 % (ref 0.0–0.2)

## 2020-04-29 LAB — GLUCOSE, CAPILLARY
Glucose-Capillary: 120 mg/dL — ABNORMAL HIGH (ref 70–99)
Glucose-Capillary: 132 mg/dL — ABNORMAL HIGH (ref 70–99)
Glucose-Capillary: 126 mg/dL — ABNORMAL HIGH (ref 70–99)
Glucose-Capillary: 138 mg/dL — ABNORMAL HIGH (ref 70–99)

## 2020-04-29 MED ORDER — SODIUM CHLORIDE 0.9 % IV SOLN: INTRAVENOUS | Status: AC

## 2020-04-29 MED ORDER — DOXYCYCLINE HYCLATE 100 MG PO TABS
100.0000 mg | ORAL_TABLET | Freq: Two times a day (BID) | ORAL | Status: DC
  Administered 2020-04-29 – 2020-04-30 (×3): 100 mg via ORAL
  Filled 2020-04-29 (×3): qty 1

## 2020-04-29 MED ORDER — SODIUM BICARBONATE 650 MG PO TABS
1300.0000 mg | ORAL_TABLET | Freq: Two times a day (BID) | ORAL | Status: DC
  Administered 2020-04-29 – 2020-04-30 (×3): 1300 mg via ORAL
  Filled 2020-04-29 (×3): qty 2

## 2020-04-29 NOTE — Progress Notes (Signed)
   Covid-19 Vaccination Clinic  Name:  Vanessa [name]    MRN: [id number] DOB: March 19, 1956  04/29/2020  [name] was observed post Covid-19 immunization for 15 minutes without incident. She was provided with Vaccine Information Sheet and instruction to access the V-Safe system.   [name] was instructed to call 911 with any severe reactions post vaccine: Marland Kitchen Difficulty breathing  . Swelling of face and throat  . A fast heartbeat  . A bad rash all over body  . Dizziness and weakness   Immunizations Administered    Name Date Dose VIS Date Route   Pfizer COVID-19 Vaccine 04/29/2020 11:28 AM 0.3 mL 09/04/2018 Intramuscular   Manufacturer: [employer], [employer]   Lot: [id number]   [place]: [id number]

## 2020-04-29 NOTE — Care Management (Addendum)
Received insurance authorization for SNF. Auth number [id number], ref number [id number] , for three days starting 10-19-21next review date April 30, 2020 .  Juliann Pulse at [facility] can accept patient today. MD aware and will order rapid covid test.  1325 Covid test back negative. Juliann Pulse at [facility] ready to admit patient.Patient will go to room 112 , [facility] nurse to call report to [phone] . Secure chatted MD for Discharge summary.    1330 Patient will stay one more day at hospital for IVF and discharge to [facility] tomorrow. Juliann Pulse at [facility] aware. Patient aware.  Magdalen Spatz RN

## 2020-04-29 NOTE — Progress Notes (Signed)
PT Cancellation Note  Patient Details Name: Vanessa [name] MRN: [id number] DOB: 1956-02-20   Cancelled Treatment:    Reason Eval/Treat Not Completed: (P) Patient at procedure or test/unavailable Pt at [facility] [place] de[facility] *delayed entry   Carlene Coria 04/29/2020, 5:11 PM

## 2020-04-29 NOTE — Plan of Care (Signed)
  Problem: Pain Managment: Goal: General experience of comfort will improve Outcome: Progressing   

## 2020-04-29 NOTE — Consult Note (Signed)
Nephrology Consult  [facility]  Requesting provider: Antonieta Pert, MD  Assessment/Recommendations: Vanessa [name] is a/an 64 y.o. female with a past medical history notable for AKI    AKI on CKD3b: Likely secondary to infection versus progression of disease -Underlying CKD likely secondary to diabetic kidney disease along with hypertensive arteriosclerosis. Does have a chronically atrophic left kidney per her previous scan -Renal ultrasound (rule out obstruction given untreated endometrial Ca) and UA with sediment exam -Agree with gentle IV fluid challenge for now (provided we are being cognizant of her volume status), would only do 1 liter over 10 hours of isotonic fluids to start with.  Hold Lasix for now.  Moving forward, may just need to do spot diuresis regimen. -If renal function is stable/slightly improved then would be okay with discharge to SNF.  Thereafter, can obtain a referral from her PCP (or facility) if needed to be seen in our office -Continue to monitor daily Cr, Dose meds for GFR<15 -Monitor Daily I/Os, Daily weight  -Maintain MAP>65 for optimal renal perfusion.  -Agree with holding ACE-I, avoid further nephrotoxins including NSAIDS, Morphine.  Unless absolutely necessary, avoid CT with contrast and/or MRI with gadolinium.     Hypertension: no changes in anti-htn regimen for now  Metabolic acidosis (non-anion gap): Likely related to chronic kidney disease.  Will start sodium bicarb 1300 mg twice daily  Anemia due to chronic disease.  Please check iron panel.  May need iron infusion (on p.o. supplements)  Diabetes Mellitus Type 2.  Management per primary service.  Would recommend not relying on her hemoglobin A1c given advanced kidney disease along with anemia.  Cellulitis (bilateral lower extremities).  Now on doxycycline   Recommendations conveyed to primary service.    Gean Quint, MD [facility] 04/29/2020 1:39  PM   _____________________________________________________________________________________   History of Present Illness: Vanessa [name] is a 64 y.o. female with a past medical history of CKD, hypertension, DM 2, morbid obesity, endometrial carcinoma, noncompliance who presents to [facility] with bilateral lower extremity cellulitis.  She received IV vancomycin, Flagyl, Rocephin.  Her creatinine has been fluctuating anywhere between 2.2-2.6 for the course of her stay.  Labs were checked again today her creatinine was up to 2.8. Of note, she was hospitalized back in May for MSSA bacteremia and COVID-19 pneumonia stable at that time from 1.6-2.  She did have an episode of AKI back after her hospitalization with peak creatinine of 3.3.  Her blood pressure medications were adjusted and was given IV fluids at that time with eventual improvement in her creatinine to 2. She is now transitioned to doxycycline in hopes of being discharged to SNF soon. She did have some dizziness during the course of her stay which is now resolved.  She does report some chronic swelling of her bilateral lower extremities and does report some residual pain from her cellulitis.  Denies any fevers, chest pain, shortness of breath, nausea/vomiting, dysgeusia, loss of appetite, episodes of confusion, orthopnea, dizziness, changes in urinary frequency.  Medications:  Current Facility-Administered Medications  Medication Dose Route Frequency Provider Last Rate Last Admin  . 0.9 %  sodium chloride infusion   Intravenous PRN Swayze, Ava, DO   Stopped at 04/26/20 1318  . 0.9 %  sodium chloride infusion   Intravenous Continuous Kc, Ramesh, MD 100 mL/hr at 04/29/20 1237 New Bag at 04/29/20 1237  . acetaminophen (TYLENOL) tablet 650 mg  650 mg Oral Q6H PRN Etta Quill, DO  650 mg at 04/29/20 0024   Or  . acetaminophen (TYLENOL) suppository 650 mg  650 mg Rectal Q6H PRN Etta Quill, DO      . amLODipine (NORVASC) tablet 10 mg  10 mg  Oral Daily Swayze, Ava, DO   10 mg at 04/29/20 1117  . aspirin chewable tablet 81 mg  81 mg Oral Daily Etta Quill, DO   81 mg at 04/29/20 1117  . carvedilol (COREG) tablet 6.25 mg  6.25 mg Oral BID WC Petrucelli, Samantha R, PA-C   6.25 mg at 04/29/20 2992  . cloNIDine (CATAPRES) tablet 0.2 mg  0.2 mg Oral BID Jennette Kettle M, DO   0.2 mg at 04/29/20 1118  . doxycycline (VIBRA-TABS) tablet 100 mg  100 mg Oral Q12H Kc, Ramesh, MD   100 mg at 04/29/20 1232  . ferrous sulfate tablet 325 mg  325 mg Oral BID WC Swayze, Ava, DO   325 mg at 04/29/20 0832  . heparin injection 5,000 Units  5,000 Units Subcutaneous Q8H Swayze, Ava, DO   5,000 Units at 04/29/20 0506  . hydrALAZINE (APRESOLINE) tablet 100 mg  100 mg Oral Q8H Swayze, Ava, DO   100 mg at 04/29/20 0506  . hydrocerin (EUCERIN) cream   Topical Daily Swayze, Ava, DO   Given at 04/29/20 1128  . insulin aspart (novoLOG) injection 0-5 Units  0-5 Units Subcutaneous QHS Jennette Kettle M, DO      . insulin aspart (novoLOG) injection 0-9 Units  0-9 Units Subcutaneous TID WC Etta Quill, DO   1 Units at 04/29/20 1232  . labetalol (NORMODYNE) injection 10-20 mg  10-20 mg Intravenous Q2H PRN Etta Quill, DO   20 mg at 04/23/20 0454  . loratadine (CLARITIN) tablet 10 mg  10 mg Oral Daily Blount, Scarlette Shorts T, NP   10 mg at 04/29/20 1118  . multivitamin with minerals tablet 1 tablet  1 tablet Oral Daily Swayze, Ava, DO   1 tablet at 04/29/20 1118  . nutrition supplement (JUVEN) (JUVEN) powder packet 1 packet  1 packet Oral BID BM Blenda Nicely, RPH   1 packet at 04/29/20 1124  . ondansetron (ZOFRAN) tablet 4 mg  4 mg Oral Q6H PRN Etta Quill, DO       Or  . ondansetron [facility]) injection 4 mg  4 mg Intravenous Q6H PRN Etta Quill, DO      . polyethylene glycol (MIRALAX / GLYCOLAX) packet 17 g  17 g Oral Daily Swayze, Ava, DO   17 g at 04/29/20 1124  . protein supplement (ENSURE MAX) liquid  11 oz Oral QHS Swayze, Ava, DO   11 oz at  04/28/20 2114  . senna (SENOKOT) tablet 8.6 mg  1 tablet Oral QHS PRN Lang Snow, FNP         ALLERGIES Ace inhibitors and Angiotensin receptor blockers  MEDICAL HISTORY Past Medical History:  Diagnosis Date  . Anemia 10/09/2019  . Asthma   . Cellulitis and abscess of lower extremity 04/22/2020  . CKD (chronic kidney disease) stage 3, GFR 30-59 ml/min (HCC)   . Dental disease   . Diabetes mellitus without complication ([place])   . Endometrial cancer ([place])   . Esophageal dysmotility   . Heart murmur   . HLD (hyperlipidemia)   . Hypertension   . MSSA bacteremia 09/2019  . Obesity, Class III, BMI 40-49.9 (morbid obesity) ([place])   . Pressure ulcer      SOCIAL HISTORY Social  History   Socioeconomic History  . Marital status: Married    Spouse name: Not on file  . Number of children: Not on file  . Years of education: Not on file  . Highest education level: Not on file  Occupational History  . Not on file  Tobacco Use  . Smoking status: Former Research scientist (life sciences)  . Smokeless tobacco: Never Used  . Tobacco comment: " years ago ", >40yrs  Vaping Use  . Vaping Use: Never used  Substance and Sexual Activity  . Alcohol use: Yes    Comment: occasional  . Drug use: No  . Sexual activity: Not Currently  Other Topics Concern  . Not on file  Social History Narrative  . Not on file   Social Determinants of Health   Financial Resource Strain:   . Difficulty of Paying Living Expenses: Not on file  Food Insecurity:   . Worried About Charity fundraiser in the Last Year: Not on file  . Ran Out of Food in the Last Year: Not on file  Transportation Needs:   . Lack of Transportation (Medical): Not on file  . Lack of Transportation (Non-Medical): Not on file  Physical Activity:   . Days of Exercise per Week: Not on file  . Minutes of Exercise per Session: Not on file  Stress:   . Feeling of Stress : Not on file  Social Connections:   . Frequency of Communication with Friends and  Family: Not on file  . Frequency of Social Gatherings with Friends and Family: Not on file  . Attends Religious Services: Not on file  . Active Member of Clubs or Organizations: Not on file  . Attends Archivist Meetings: Not on file  . Marital Status: Not on file  Intimate Partner Violence:   . Fear of Current or Ex-Partner: Not on file  . Emotionally Abused: Not on file  . Physically Abused: Not on file  . Sexually Abused: Not on file     FAMILY HISTORY Family History  Problem Relation Age of Onset  . Stroke Mother   . Diabetes Mother   . Hypertension Mother   . Cancer Mother        possible ovarian  . Diabetes Brother   . Hypertension Brother   . Cancer Maternal Grandmother        possible ovarian      Review of Systems: 12 systems reviewed Otherwise as per HPI, all other systems reviewed and negative  Physical Exam: Vitals:   04/29/20 0431 04/29/20 0831  BP: (!) 146/73 (!) 156/81  Pulse: 70 75  Resp: 15   Temp: 97.6 F (36.4 C) 97.7 F (36.5 C)  SpO2: 98% 100%   Total I/O In: 240 [P.O.:240] Out: -   Intake/Output Summary (Last 24 hours) at 04/29/2020 1339 Last data filed at 04/29/2020 0800 Gross per 24 hour  Intake 520 ml  Output 100 ml  Net 420 ml   General: well-appearing, no acute distress HEENT: anicteric sclera, oropharynx clear without lesions, dry mucosal membranes, poor dentition CV: regular rate, normal rhythm, no murmurs, no gallops, no rubs Lungs: clear to auscultation bilaterally, normal work of breathing, no overt W/R/R/C, bilateral chest expansion Abd: Nice, soft, non-tender, non-distended Skin: no visible lesions or rashes Psych: alert, engaged, appropriate mood and affect Musculoskeletal: 1+ pitting edema bilateral lower extremities with dressings in place Neuro: normal speech, no gross focal deficits   Test Results Reviewed Lab Results  Component Value Date  NA 142 04/29/2020   K 4.4 04/29/2020   CL 113 (H)  04/29/2020   CO2 19 (L) 04/29/2020   BUN 82 (H) 04/29/2020   CREATININE 2.79 (H) 04/29/2020   GLU 130 12/10/2019   CALCIUM 8.4 (L) 04/29/2020   ALBUMIN 2.5 (L) 04/29/2020   PHOS 4.2 11/07/2019     I have reviewed all relevant outside healthcare records related to the patient's kidney injury.

## 2020-04-29 NOTE — Progress Notes (Signed)
PROGRESS NOTE    Vanessa [name]  EBR:[id number] DOB: [birth year] DOA: 04/22/2020 PCP: Pcp, No   Chief Complaint  Patient presents with  . Leg Wounds    Brief Narrative: 64 yr old woman who presented to [facility] on 04/21/2020 with complaints of foul drainage and pain in her lower extremities. These wounds are chronic and as per the patient's report had been "healed up" at one point. However in the past week she has had onset of erythema, purulent drainage and increased pain. The patient has a past medical history significant for hypertension, CKD III, DM II, and morbid obesity with BMI of 47.7. She has recently been diagnosed with endometrial cancer.  Patient was seen in the ED found to have infection of bilateral lower extremities and admitted with IV antibiotics.  Wound care was consulted.  She was treated with  IV Vancomycin Flagyl, and rocephin.   Blood culture has remained negative except for staph xylosus in 1 set-unclear significance likely contamination patient completed IV antibiotics 8 days. Leg infection significantly improved.  Seen by PT OT advised skilled nursing facility.Approval obtained 10/20.  Patient requested second dose for colovesical irrigator secondary that she was sick last time.  Patient was given second dose of Covid shot and also Covid swab today  Subjective: Alert awake, resting comfortably.  She feels her leg is 90% improved. BUN/Creatinine is up trending.   Assessment & Plan:  Bilateral lower leg cellulitis: With chronic ulceration.  Completed IV antibiotics 8 days and will switch to doxycycline.  Continue to elevate the leg, holding Lasix due to renal dysfunction.  Continue wound care.   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  I discussed with Dr. Candiss Norse from nephrology given uptrending BUN and creatinine we  will gently hydrate her with ivf for 10 hrs,repeat BMP in am,nephrology will see the patient in consult, obtain renal ultrasound, urinalysis with sediment exam. Recent Labs  Lab 04/23/20 0636 04/24/20 0016 04/25/20 0120 04/26/20 0129 04/29/20 0123  BUN 35* 41* 49* 57* 82*  CREATININE 2.42* 2.56* 2.34* 2.60* 2.79*   DM GS:[id number] A1c is controlled at 5.9.  Monitor CBG.    Hypokalemia: Resolved Recent Labs  Lab 04/23/20 0636 04/24/20 0016 04/25/20 0120 04/26/20 0129 04/29/20 0123  K 3.7 4.0 4.0 4.0 4.4   Anemia: Fe deficiency anemia.  Chronically low in 7 to 8 g range.  Monitor closely.  Continue iron supplementation at home.   Recent Labs  Lab 04/23/20 0636 04/24/20 0016 04/26/20 0129 04/29/20 0123  HGB 8.1* 7.9* 8.3* 7.9*  HCT 26.4* 25.8* 27.5* 27.1*   Endometrial cancer: As per oncology as outpatient.  Noncompliance with medical regimen: noted.  Morbid obesity with BMI 48.  Will benefit with weight loss and lifestyle.  Nutrition: Diet Order            Diet Carb Modified Fluid consistency: Thin; Room service appropriate? Yes  Diet effective now                 Nutrition Problem: Increased nutrient needs Etiology: wound healing Signs/Symptoms: estimated needs Interventions: MVI, Juven, Premier Protein  Body mass index is 48.09 kg/m.  Pressure Ulcer: DVT prophylaxis: heparin injection 5,000 Units Start: 04/22/20 1400 Code Status:   Code Status: Full Code  Family Communication: plan of care discussed with patient at bedside.  Status is: Inpatient Remains inpatient appropriate because:IV treatments appropriate due to intensity of illness or inability to take PO and Monitor 24 hours for renal function stabilization, keep on ivf, pending nephrology consultation.  Dispo: The patient is from: Home              Anticipated d/c is to: SNF              Anticipated d/c date is: 1 day              Patient currently is not medically stable to  d/c.  Consultants:see note  Procedures:see note  Culture/Microbiology    Component Value Date/Time   SDES BLOOD LEFT HAND 04/23/2020 0648   SPECREQUEST  04/23/2020 0648    BOTTLES DRAWN AEROBIC AND ANAEROBIC Blood Culture adequate volume   CULT  04/23/2020 0648    NO GROWTH 5 DAYS Performed at [facility], [place] [address] [place], [place] [address]    REPTSTATUS 04/28/2020 FINAL 04/23/2020 8101    Other culture-see note  Medications: Scheduled Meds: . amLODipine  10 mg Oral Daily  . aspirin  81 mg Oral Daily  . carvedilol  6.25 mg Oral BID WC  . cloNIDine  0.2 mg Oral BID  . doxycycline  100 mg Oral Q12H  . ferrous sulfate  325 mg Oral BID WC  . heparin injection (subcutaneous)  5,000 Units Subcutaneous Q8H  . hydrALAZINE  100 mg Oral Q8H  . hydrocerin   Topical Daily  . insulin aspart  0-5 Units Subcutaneous QHS  . insulin aspart  0-9 Units Subcutaneous TID WC  . loratadine  10 mg Oral Daily  . multivitamin with minerals  1 tablet Oral Daily  . nutrition supplement (JUVEN)  1 packet Oral BID BM  . polyethylene glycol  17 g Oral Daily  . Ensure Max Protein  11 oz Oral QHS   Continuous Infusions: . sodium chloride Stopped (04/26/20 1318)  . sodium chloride      Antimicrobials: Anti-infectives (From admission, onward)   Start     Dose/Rate Route Frequency Ordered Stop   04/29/20 1215  doxycycline (VIBRA-TABS) tablet 100 mg        100 mg Oral Every 12 hours 04/29/20 1115     04/28/20 0000  cephALEXin (KEFLEX) 500 MG capsule        500 mg Oral 4 times daily 04/28/20 1800 05/05/20 2359   04/25/20 1239  vancomycin (VANCOCIN) IVPB 1000 mg/200 mL premix  Status:  Discontinued        1,000 mg 200 mL/hr over 60 Minutes Intravenous Every 48 hours 04/24/20 0914 04/29/20 1115   04/23/20 1000  vancomycin (VANCOCIN) IVPB 1000 mg/200 mL premix  Status:  Discontinued        1,000 mg 200 mL/hr over 60 Minutes Intravenous Every 24 hours 04/22/20 0436 04/24/20 0914    04/22/20 1000  cefTRIAXone (ROCEPHIN) 2 g in sodium chloride 0.9 % 100 mL IVPB  Status:  Discontinued        2 g 200 mL/hr over 30 Minutes Intravenous Every 24 hours 04/22/20 0423 04/29/20 1115   04/22/20 0800  metroNIDAZOLE (FLAGYL) IVPB 500 mg  Status:  Discontinued        500 mg 100 mL/hr over 60 Minutes Intravenous Every 8 hours 04/22/20 0423 04/29/20 1115   04/22/20 0330  vancomycin (VANCOREADY) IVPB 1500 mg/300 mL        1,500 mg 150 mL/hr over 120 Minutes Intravenous  Once 04/22/20  0308 04/22/20 0721   04/22/20 0315  piperacillin-tazobactam (ZOSYN) IVPB 3.375 g        3.375 g 100 mL/hr over 30 Minutes Intravenous  Once 04/22/20 0308 04/22/20 0344     Objective: Vitals: Today's Vitals   04/29/20 0024 04/29/20 0124 04/29/20 0431 04/29/20 0831  BP:   (!) 146/73 (!) 156/81  Pulse:   70 75  Resp:   15   Temp:   97.6 F (36.4 C) 97.7 F (36.5 C)  TempSrc:   Oral Oral  SpO2:   98% 100%  Weight:      Height:      PainSc: 5  Asleep      Intake/Output Summary (Last 24 hours) at 04/29/2020 1206 Last data filed at 04/29/2020 0800 Gross per 24 hour  Intake 760 ml  Output 100 ml  Net 660 ml   Filed Weights   04/22/20 1552 04/24/20 2100  Weight: 107.3 kg 108 kg   Weight change:   Intake/Output from previous day: 10/19 0701 - 10/20 0700 In: 760 [P.O.:760] Out: 600 [Urine:600] Intake/Output this shift: Total I/O In: 240 [P.O.:240] Out: -   Examination: General exam: AAO ,NAD, weak appearing. HEENT:Oral mucosa moist, Ear/Nose WNL grossly,dentition normal. Respiratory system: bilaterally clear,no wheezing or crackles,no use of accessory muscle, non tender. Cardiovascular system: S1 & S2 +, regular, No JVD. Gastrointestinal system: Abdomen soft, NT,ND, BS+. Nervous System:Alert, awake, moving extremities and grossly nonfocal Extremities: Bilateral lower extremity with intact dressing  Skin: No rashes,no icterus. MSK: Normal muscle bulk,tone, power  Data Reviewed: I  have personally reviewed following labs and imaging studies CBC: Recent Labs  Lab 04/23/20 0636 04/24/20 0016 04/26/20 0129 04/29/20 0123  WBC 4.1 4.3 4.9 4.3  NEUTROABS  --  3.0 3.6 3.1  HGB 8.1* 7.9* 8.3* 7.9*  HCT 26.4* 25.8* 27.5* 27.1*  MCV 89.5 89.0 89.9 94.8  PLT 229 224 243 604   Basic Metabolic Panel: Recent Labs  Lab 04/23/20 0636 04/24/20 0016 04/25/20 0120 04/26/20 0129 04/29/20 0123  NA 142 141 142 142 142  K 3.7 4.0 4.0 4.0 4.4  CL 111 111 112* 110 113*  CO2 19* 20* 20* 21* 19*  GLUCOSE 117* 129* 160* 157* 153*  BUN 35* 41* 49* 57* 82*  CREATININE 2.42* 2.56* 2.34* 2.60* 2.79*  CALCIUM 8.1* 7.9* 7.9* 8.2* 8.4*   GFR: Estimated Creatinine Clearance: 21.9 mL/min (A) (by C-G formula based on SCr of 2.79 mg/dL (H)). Liver Function Tests: Recent Labs  Lab 04/29/20 0123  AST 28  ALT 14  ALKPHOS 99  BILITOT 0.5  PROT 6.9  ALBUMIN 2.5*   No results for input(s): LIPASE, AMYLASE in the last 168 hours. No results for input(s): AMMONIA in the last 168 hours. Coagulation Profile: No results for input(s): INR, PROTIME in the last 168 hours. Cardiac Enzymes: No results for input(s): CKTOTAL, CKMB, CKMBINDEX, TROPONINI in the last 168 hours. BNP (last 3 results) No results for input(s): PROBNP in the last 8760 hours. HbA1C: No results for input(s): HGBA1C in the last 72 hours. CBG: Recent Labs  Lab 04/28/20 1149 04/28/20 1640 04/28/20 2031 04/29/20 0806 04/29/20 1137  GLUCAP 126* 133* 176* 120* 132*   Lipid Profile: No results for input(s): CHOL, HDL, LDLCALC, TRIG, CHOLHDL, LDLDIRECT in the last 72 hours. Thyroid Function Tests: No results for input(s): TSH, T4TOTAL, FREET4, T3FREE, THYROIDAB in the last 72 hours. Anemia Panel: No results for input(s): VITAMINB12, FOLATE, FERRITIN, TIBC, IRON, RETICCTPCT in the last 72 hours.  Sepsis Labs: No results for input(s): PROCALCITON, LATICACIDVEN in the last 168 hours.  Recent Results (from the past  240 hour(s))  Blood culture (routine x 2)     Status: Abnormal (Preliminary result)   Collection Time: 04/22/20  2:50 AM   Specimen: BLOOD RIGHT HAND  Result Value Ref Range Status   Specimen Description BLOOD RIGHT HAND  Final   Special Requests   Final    BOTTLES DRAWN AEROBIC AND ANAEROBIC Blood Culture results may not be optimal due to an inadequate volume of blood received in culture bottles   Culture  Setup Time   Final    IN BOTH AEROBIC AND ANAEROBIC BOTTLES GRAM POSITIVE COCCI IN CLUSTERS CRITICAL RESULT CALLED TO, READ BACK BY AND VERIFIED WITH: V BRYK PHARMD 04/23/20 0028 JDW    Culture (A)  Final    STAPHYLOCOCCUS XYLOSUS THE SIGNIFICANCE OF ISOLATING THIS ORGANISM FROM A SINGLE SET OF BLOOD CULTURES WHEN MULTIPLE SETS ARE DRAWN IS UNCERTAIN. PLEASE NOTIFY THE MICROBIOLOGY DEPARTMENT WITHIN ONE WEEK IF SPECIATION AND SENSITIVITIES ARE REQUIRED. Performed at [facility], [place] [address], [place], [place] [address]    Report Status PENDING  Incomplete  Blood Culture ID Panel (Reflexed)     Status: Abnormal   Collection Time: 04/22/20  2:50 AM  Result Value Ref Range Status   Enterococcus faecalis NOT DETECTED NOT DETECTED Final   Enterococcus Faecium NOT DETECTED NOT DETECTED Final   Listeria monocytogenes NOT DETECTED NOT DETECTED Final   Staphylococcus species DETECTED (A) NOT DETECTED Final    Comment: CRITICAL RESULT CALLED TO, READ BACK BY AND VERIFIED WITH: V BRYK PHARMD 04/23/20 0028 JDW    Staphylococcus aureus (BCID) NOT DETECTED NOT DETECTED Final   Staphylococcus epidermidis NOT DETECTED NOT DETECTED Final   Staphylococcus lugdunensis NOT DETECTED NOT DETECTED Final   Streptococcus species NOT DETECTED NOT DETECTED Final   Streptococcus agalactiae NOT DETECTED NOT DETECTED Final   Streptococcus pneumoniae NOT DETECTED NOT DETECTED Final   Streptococcus pyogenes NOT DETECTED NOT DETECTED Final   A.calcoaceticus-baumannii NOT DETECTED NOT DETECTED Final    Bacteroides fragilis NOT DETECTED NOT DETECTED Final   Enterobacterales NOT DETECTED NOT DETECTED Final   Enterobacter cloacae complex NOT DETECTED NOT DETECTED Final   Escherichia coli NOT DETECTED NOT DETECTED Final   Klebsiella aerogenes NOT DETECTED NOT DETECTED Final   Klebsiella oxytoca NOT DETECTED NOT DETECTED Final   Klebsiella pneumoniae NOT DETECTED NOT DETECTED Final   Proteus species NOT DETECTED NOT DETECTED Final   Salmonella species NOT DETECTED NOT DETECTED Final   Serratia marcescens NOT DETECTED NOT DETECTED Final   Haemophilus influenzae NOT DETECTED NOT DETECTED Final   Neisseria meningitidis NOT DETECTED NOT DETECTED Final   Pseudomonas aeruginosa NOT DETECTED NOT DETECTED Final   Stenotrophomonas maltophilia NOT DETECTED NOT DETECTED Final   Candida albicans NOT DETECTED NOT DETECTED Final   Candida auris NOT DETECTED NOT DETECTED Final   Candida glabrata NOT DETECTED NOT DETECTED Final   Candida krusei NOT DETECTED NOT DETECTED Final   Candida parapsilosis NOT DETECTED NOT DETECTED Final   Candida tropicalis NOT DETECTED NOT DETECTED Final   Cryptococcus neoformans/gattii NOT DETECTED NOT DETECTED Final    Comment: Performed at [facility], [address], [place], [place] [address]  Respiratory Panel by RT PCR (Flu A&B, Covid) - Nasopharyngeal Swab     Status: None   Collection Time: 04/22/20  3:48 AM   Specimen: Nasopharyngeal Swab  Result Value  Ref Range Status   SARS Coronavirus 2 by RT PCR NEGATIVE NEGATIVE Final    Comment: (NOTE) SARS-CoV-2 target nucleic acids are NOT DETECTED.  The SARS-CoV-2 RNA is generally detectable in upper respiratoy specimens during the acute phase of infection. The lowest concentration of SARS-CoV-2 viral copies this assay can detect is 131 copies/mL. A negative result does not preclude SARS-Cov-2 infection and should not be used as the sole basis for treatment or other patient management decisions. A negative result  may occur with  improper specimen collection/handling, submission of specimen other than nasopharyngeal swab, presence of viral mutation(s) within the areas targeted by this assay, and inadequate number of viral copies (<131 copies/mL). A negative result must be combined with clinical observations, patient history, and epidemiological information. The expected result is Negative.  Fact Sheet for Patients:  PinkCheek.be  Fact Sheet for Healthcare Providers:  GravelBags.it  This test is no t yet approved or cleared by the [place] FDA and  has been authorized for detection and/or diagnosis of SARS-CoV-2 by FDA under an Emergency Use Authorization (EUA). This EUA will remain  in effect (meaning this test can be used) for the duration of the COVID-19 declaration under Section 564(b)(1) of the Act, 21 U.S.C. section 360bbb-3(b)(1), unless the authorization is terminated or revoked sooner.     Influenza A by PCR NEGATIVE NEGATIVE Final   Influenza B by PCR NEGATIVE NEGATIVE Final    Comment: (NOTE) The Xpert Xpress SARS-CoV-2/FLU/RSV assay is intended as an aid in  the diagnosis of influenza from Nasopharyngeal swab specimens and  should not be used as a sole basis for treatment. Nasal washings and  aspirates are unacceptable for Xpert Xpress SARS-CoV-2/FLU/RSV  testing.  Fact Sheet for Patients: PinkCheek.be  Fact Sheet for Healthcare Providers: GravelBags.it  This test is not yet approved or cleared by the [place] FDA and  has been authorized for detection and/or diagnosis of SARS-CoV-2 by  FDA under an Emergency Use Authorization (EUA). This EUA will remain  in effect (meaning this test can be used) for the duration of the  Covid-19 declaration under Section 564(b)(1) of the Act, 21  U.S.C. section 360bbb-3(b)(1), unless the authorization is  terminated  or revoked. Performed at [facility], [place] [address], [place], [place] [address]   Blood culture (routine x 2)     Status: None   Collection Time: 04/22/20 10:00 AM   Specimen: BLOOD  Result Value Ref Range Status   Specimen Description BLOOD SITE NOT SPECIFIED  Final   Special Requests   Final    BOTTLES DRAWN AEROBIC AND ANAEROBIC Blood Culture adequate volume   Culture   Final    NO GROWTH 5 DAYS Performed at [facility], [address], [place], [place] [address]    Report Status 04/27/2020 FINAL  Final  Culture, blood (routine x 2)     Status: None   Collection Time: 04/23/20  6:37 AM   Specimen: BLOOD LEFT ARM  Result Value Ref Range Status   Specimen Description BLOOD LEFT ARM  Final   Special Requests   Final    BOTTLES DRAWN AEROBIC AND ANAEROBIC Blood Culture adequate volume   Culture   Final    NO GROWTH 5 DAYS Performed at [facility], [place] [address] [place], [place] [address]    Report Status 04/28/2020 FINAL  Final  Culture, blood (routine x 2)     Status: None   Collection Time:  04/23/20  6:48 AM   Specimen: BLOOD LEFT HAND  Result Value Ref Range Status   Specimen Description BLOOD LEFT HAND  Final   Special Requests   Final    BOTTLES DRAWN AEROBIC AND ANAEROBIC Blood Culture adequate volume   Culture   Final    NO GROWTH 5 DAYS Performed at [facility], [address], [place], [place] [address]    Report Status 04/28/2020 FINAL  Final     Radiology Studies: No results found.   LOS: 6 days   Antonieta Pert, MD [facility]  04/29/2020, 12:06 PM

## 2020-04-30 DIAGNOSIS — L03115 Cellulitis of right lower limb: Secondary | ICD-10-CM | POA: Diagnosis not present

## 2020-04-30 DIAGNOSIS — L03116 Cellulitis of left lower limb: Secondary | ICD-10-CM | POA: Diagnosis not present

## 2020-04-30 LAB — BASIC METABOLIC PANEL
Anion gap: 9 (ref 5–15)
BUN: 82 mg/dL — ABNORMAL HIGH (ref 8–23)
CO2: 19 mmol/L — ABNORMAL LOW (ref 22–32)
Calcium: 8.6 mg/dL — ABNORMAL LOW (ref 8.9–10.3)
Chloride: 116 mmol/L — ABNORMAL HIGH (ref 98–111)
Creatinine, Ser: 2.62 mg/dL — ABNORMAL HIGH (ref 0.44–1.00)
GFR, Estimated: 18 mL/min — ABNORMAL LOW (ref >60)
Glucose, Bld: 129 mg/dL — ABNORMAL HIGH (ref 70–99)
Potassium: 4.4 mmol/L (ref 3.5–5.1)
Sodium: 144 mmol/L (ref 135–145)

## 2020-04-30 LAB — CBC
HCT: 27.2 % — ABNORMAL LOW (ref 36.0–46.0)
Hemoglobin: 8.1 g/dL — ABNORMAL LOW (ref 12.0–15.0)
MCH: 27.6 pg (ref 26.0–34.0)
MCHC: 29.8 g/dL — ABNORMAL LOW (ref 30.0–36.0)
MCV: 92.8 fL (ref 80.0–100.0)
Platelets: 253 10*3/uL (ref 150–400)
RBC: 2.93 MIL/uL — ABNORMAL LOW (ref 3.87–5.11)
RDW: 19.9 % — ABNORMAL HIGH (ref 11.5–15.5)
WBC: 6.2 10*3/uL (ref 4.0–10.5)
nRBC: 0 % (ref 0.0–0.2)

## 2020-04-30 LAB — CULTURE, BLOOD (ROUTINE X 2)

## 2020-04-30 LAB — GLUCOSE, CAPILLARY
Glucose-Capillary: 140 mg/dL — ABNORMAL HIGH (ref 70–99)
Glucose-Capillary: 145 mg/dL — ABNORMAL HIGH (ref 70–99)

## 2020-04-30 MED ORDER — SODIUM BICARBONATE 650 MG PO TABS: 1300.0000 mg | ORAL_TABLET | Freq: Two times a day (BID) | ORAL | Status: DC

## 2020-04-30 MED ORDER — DOXYCYCLINE HYCLATE 100 MG PO TABS: 100.0000 mg | ORAL_TABLET | Freq: Two times a day (BID) | ORAL | 0 refills | Status: AC

## 2020-04-30 MED ORDER — HYDRALAZINE HCL 100 MG PO TABS: 100.0000 mg | ORAL_TABLET | Freq: Three times a day (TID) | ORAL | Status: DC

## 2020-04-30 NOTE — Progress Notes (Signed)
[facility] Progress Note    Assessment/ Plan:   AKI on CKD3b: Likely secondary to infection versus progression of disease -Underlying CKD likely secondary to diabetic kidney disease along with hypertensive arteriosclerosis. Does have a chronically atrophic left kidney per her previous scan -Kidney function relatively unchanged.  No obstruction or renal ultrasound -Urinalysis with numerous red blood cells.  This needs to be worked up as an outpatient -If renal function is stable/slightly improved then would be okay with discharge to SNF.  Thereafter, can obtain a referral from her PCP (or facility) if needed to be seen in our office -Continue to monitor daily Cr, Dose meds for GFR<15 -Monitor Daily I/Os, Daily weight  -Maintain MAP>65 for optimal renal perfusion.  -Agree with holding ACE-I, avoid further nephrotoxins including NSAIDS, Morphine.  Unless absolutely necessary, avoid CT with contrast and/or MRI with gadolinium.     Hypertension: no changes in anti-htn regimen for now  Microscopic hematuria: Needs to be worked up as an outpatient  Metabolic acidosis (non-anion gap): Likely related to chronic kidney disease.    Started sodium bicarb 1300 mg twice daily  Anemia due to chronic disease.  Please check iron panel.  May need iron infusion (on p.o. supplements)  Diabetes Mellitus Type 2.  Management per primary service.  Would recommend not relying on her hemoglobin A1c given advanced kidney disease along with anemia.  Cellulitis (bilateral lower extremities).  Now on doxycycline   Recommendations conveyed to primary service.   Okay for discharge from a nephrology perspective provided she can care close monitoring of her labs at her facility.  If needed a referral can be placed to our office for further assistance.  Subjective:   Eager to go to her facility.  Feels cold otherwise no complaints   Objective:   BP (!) 146/68 (BP Location: Right Arm)   Pulse  73   Temp 98.2 F (36.8 C) (Oral)   Resp 18   Ht 4\' 11"  (1.499 m)   Wt 108 kg   LMP 03/17/2015   SpO2 98%   BMI 48.09 kg/m   Intake/Output Summary (Last 24 hours) at 04/30/2020 1103 Last data filed at 04/30/2020 1005 Gross per 24 hour  Intake 400 ml  Output --  Net 400 ml   Weight change:   Physical Exam: General: well-appearing, no acute distress HEENT: anicteric sclera, oropharynx clear without lesions, dry mucosal membranes, poor dentition CV: regular rate, normal rhythm, no murmurs, no gallops, no rubs Lungs: clear to auscultation bilaterally, normal work of breathing, no overt W/R/R/C, bilateral chest expansion Abd: Nice, soft, non-tender, non-distended Skin: no visible lesions or rashes Psych: alert, engaged, appropriate mood and affect Musculoskeletal: 1+ pitting edema bilateral lower extremities with dressings in place Neuro: normal speech, no gross focal deficits  Imaging: US RENAL  Result Date: 04/29/2020 CLINICAL DATA:  Acute kidney injury. EXAM: RENAL / URINARY TRACT ULTRASOUND COMPLETE COMPARISON:  None. FINDINGS: Right Kidney: Renal measurements: 11.2 x 6.2 x 5.5 cm = volume: 197 mL. Increased echogenicity is noted suggesting medical renal disease. No mass or hydronephrosis visualized. Left Kidney: Renal measurements: 6.1 x 3.8 x 2.6 cm = volume: 32 mL. Increased echogenicity of renal parenchyma is noted suggesting medical renal disease. No mass or hydronephrosis visualized. Bladder: Appears normal for degree of bladder distention. Other: Mild ascites is noted. IMPRESSION: Increased echogenicity of renal parenchyma is noted bilaterally suggesting medical renal disease. Severe left renal atrophy is noted. No hydronephrosis or renal obstruction is noted. Mild ascites.  Electronically Signed   By: Marijo Conception M.D.   On: 04/29/2020 15:07    Labs: BMET Recent Labs  Lab 04/24/20 0016 04/25/20 0120 04/26/20 0129 04/29/20 0123 04/30/20 0235  NA 141 142 142 142  144  K 4.0 4.0 4.0 4.4 4.4  CL 111 112* 110 113* 116*  CO2 20* 20* 21* 19* 19*  GLUCOSE 129* 160* 157* 153* 129*  BUN 41* 49* 57* 82* 82*  CREATININE 2.56* 2.34* 2.60* 2.79* 2.62*  CALCIUM 7.9* 7.9* 8.2* 8.4* 8.6*   CBC Recent Labs  Lab 04/24/20 0016 04/26/20 0129 04/29/20 0123 04/30/20 0235  WBC 4.3 4.9 4.3 6.2  NEUTROABS 3.0 3.6 3.1  --   HGB 7.9* 8.3* 7.9* 8.1*  HCT 25.8* 27.5* 27.1* 27.2*  MCV 89.0 89.9 94.8 92.8  PLT 224 243 258 253    Medications:    . amLODipine  10 mg Oral Daily  . aspirin  81 mg Oral Daily  . carvedilol  6.25 mg Oral BID WC  . cloNIDine  0.2 mg Oral BID  . doxycycline  100 mg Oral Q12H  . ferrous sulfate  325 mg Oral BID WC  . heparin injection (subcutaneous)  5,000 Units Subcutaneous Q8H  . hydrALAZINE  100 mg Oral Q8H  . hydrocerin   Topical Daily  . insulin aspart  0-5 Units Subcutaneous QHS  . insulin aspart  0-9 Units Subcutaneous TID WC  . loratadine  10 mg Oral Daily  . multivitamin with minerals  1 tablet Oral Daily  . nutrition supplement (JUVEN)  1 packet Oral BID BM  . polyethylene glycol  17 g Oral Daily  . Ensure Max Protein  11 oz Oral QHS  . sodium bicarbonate  1,300 mg Oral BID      Gean Quint, MD [facility] 04/30/2020, 11:03 AM

## 2020-04-30 NOTE — Discharge Summary (Signed)
Physician Discharge Summary  Vanessa [name] YHC:[id number] DOB: May 21, 1956 DOA: 04/22/2020  PCP: Pcp, No  Admit date: 04/22/2020 Discharge date: 04/30/2020  Admitted From: home Disposition:  SNF  Recommendations for Outpatient Follow-up:  1. Follow up with PCP in 1-2 weeks 2. Please obtain BMP/CBC in one week 3. Please follow up on the following pending results:  Home Health:NO  Equipment/Devices: NONE  Discharge Condition: Stable Code Status:   Code Status: Full Code Diet recommendation:  Diet Order            Diet Carb Modified           Diet Carb Modified Fluid consistency: Thin; Room service appropriate? Yes  Diet effective now                  Brief/Interim Summary: 64 yr old woman who presented to [facility] on [facility]/21/2020 with complaints of foul drainage and pain in her lower extremities. These wounds are chronic and as per the patient's report had been "healed up" at one point. However in the past week she has had onset of erythema, purulent drainage and increased pain. The patient has a past medical history significant for hypertension, CKD III, DM II, and morbid obesity with BMI of 47.7. She has recently been diagnosed with endometrial cancer.  Patient was seen in the ED found to have infection of bilateral lower extremities and admitted with IV antibiotics.  Wound care was consulted.  She was treated with  IV Vancomycin Flagyl, and rocephin.   Blood culture has remained negative except for staph xylosus in 1 set-unclear significance likely contamination patient completed IV antibiotics 8 days. Leg infection significantly improved.  Seen by PT OT advised skilled nursing facility.Approval obtained 10/20.  Patient requested second dose for colovesical irrigator secondary that she was sick last time.  Patient was given second dose of Covid shot and also Covid swab 10/21. Worsening renal function nephro consulted underwent an ultrasound EV renal ultrasound did not show any  obstruction but medical renal disease.  Hydrate gently creatinine stable slightly better 2.6. she is medically stable for d/c to SNF.  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  Completed IV antibiotics 8 days and switched to doxycycline and she will complete the course.  She will continue with outpatient wound care at the rehab.   "Wound care tro chronic, nonhealing LE lesions:  Wash LEs with soap and water, rinse and pat dry. Apply a thin layer of Eucerin cream to legs. Cover lesions on left LE (medial and anterior) and right anterior LE with xeroform gauze, cover with ABD pad and wrap from just below toes to just below knees with Kerlix roll gauze/paper tape.  Place feet into [employer]. 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  I discussed with Dr. Candiss Norse from nephrology given uptrending BUN and patient was gently hydrated overnight creatinine slightly better 2.6.  Renal ultrasound no obstruction.  Patient is a started on bicarb drip for metabolic acidosis.  She will follow up with nephrology as outpatient avoid nephrotoxic medication NSAIDs ACE ARB's and diuretics for now.  Discussed with nephrology for discharge plan-will need follow-up BMP in 5 to 7 days at the [facility] Recent Labs  Lab 04/24/20 0016 04/25/20 0120 04/26/20 0129 04/29/20 0123 04/30/20 0235  BUN 41*  49* 57* 82* 82*  CREATININE 2.56* 2.34* 2.60* 2.79* 2.62*   DM GE:[id number] A1c is controlled at 5.9.  Monitor CBG.    Hypokalemia: Resolved Anemia: Fe deficiency anemia.  Chronically low in 7 to 8 g range.  Monitor closely.  Continue iron supplementation at home.   Endometrial cancer: As per oncology as outpatient. Follow OP. Noncompliance with medical regimen: noted. Morbid obesity with BMI 48.  Will benefit  with weight loss and lifestyle  Consults:  Nephrology.  Subjective: Alert awake, resting comfortably on the bedside chair. Agrees for discharge to SNF today Discharge Exam: Vitals:   04/29/20 2055 04/30/20 0454  BP: (!) 149/69 (!) 146/68  Pulse: 71 73  Resp: 17 18  Temp: 98.2 F (36.8 C) 98.2 F (36.8 C)  SpO2: 100% 98%   General: Pt is alert, awake, not in acute distress Cardiovascular: RRR, S1/S2 +, no rubs, no gallops Respiratory: CTA bilaterally, no wheezing, no rhonchi Abdominal: Soft, NT, ND, bowel sounds + Extremities: Edematous bilateral lower extremity with chronic ulceration nonhealing, no cyanosis  Discharge Instructions  Discharge Instructions    Diet Carb Modified   Complete by: As directed    Discharge instructions   Complete by: As directed    Please call call MD or return to ER for similar or worsening recurring problem that brought you to hospital or if any fever,nausea/vomiting,abdominal pain, uncontrolled pain, chest pain,  shortness of breath or any other alarming symptoms.  Please follow-up your doctor as instructed in a week time and call the office for appointment.  Please avoid alcohol, smoking, or any other illicit substance and maintain healthy habits including taking your regular medications as prescribed.  You were cared for by a hospitalist during your hospital stay. If you have any questions about your discharge medications or the care you received while you were in the hospital after you are discharged, you can call the unit and ask to speak with the hospitalist on call if the hospitalist that took care of you is not available.  Once you are discharged, your primary care physician will handle any further medical issues. Please note that NO REFILLS for any discharge medications will be authorized once you are discharged, as it is imperative that you return to your primary care physician (or establish a relationship with a primary care physician  if you do not have one) for your aftercare needs so that they can reassess your need for medications and monitor your lab values   Discharge wound care:   Complete by: As directed    Wound care tro chronic, nonhealing LE lesions:  Wash LEs with soap and water, rinse and pat dry. Apply a thin layer of Eucerin cream to legs. Cover lesions on left LE (medial and anterior) and right anterior LE with xeroform gauze, cover with ABD pad and wrap from just below toes to just below knees with Kerlix roll gauze/paper tape.  Place feet into [employer]. Change daily   Increase activity slowly   Complete by: As directed      Allergies as of 04/30/2020      Reactions   Ace Inhibitors    Never prescribed but she has had angioedema   Angiotensin Receptor Blockers    ARB never Rxed but PMH  of angioedema in March 2021      Medication List    STOP taking these medications   Bayer Contour Monitor w/Device Kit   benzocaine 10 % mucosal gel Commonly known as: ORAJEL  Chlorhexidine Gluconate Cloth 2 % Pads   glucose blood test strip Commonly known as: Estate manager/land agent   INSULIN SYRINGE 1CC/29G 29G X 1/2" 1 ML Misc   onetouch ultrasoft lancets   pantoprazole sodium 40 mg/20 mL Pack Commonly known as: PROTONIX     TAKE these medications   acetaminophen 500 MG tablet Commonly known as: TYLENOL Take 500 mg by mouth every 6 (six) hours as needed for moderate pain.   amLODipine 10 MG tablet Commonly known as: NORVASC Take 1 tablet (10 mg total) by mouth daily.   aspirin 81 MG chewable tablet Chew 81 mg by mouth daily.   carvedilol 6.25 MG tablet Commonly known as: COREG Take 1 tablet (6.25 mg total) by mouth 2 (two) times daily with a meal.   cloNIDine 0.2 MG tablet Commonly known as: CATAPRES Take 1 tablet (0.2 mg total) by mouth 2 (two) times daily.   diphenhydrAMINE 25 MG tablet Commonly known as: BENADRYL Take 25 mg by mouth 3 (three) times daily as needed for itching.    doxycycline 100 MG tablet Commonly known as: VIBRA-TABS Take 1 tablet (100 mg total) by mouth every 12 (twelve) hours for 4 days.   feeding supplement (PRO-STAT SUGAR FREE 64) Liqd Take 30 mLs by mouth 3 (three) times daily with meals.   feeding supplement Liqd Take 237 mLs by mouth 3 (three) times daily between meals. What changed: Another medication with the same name was added. Make sure you understand how and when to take each.   nutrition supplement (JUVEN) Pack Take 1 packet by mouth 2 (two) times daily between meals. What changed: You were already taking a medication with the same name, and this prescription was added. Make sure you understand how and when to take each.   ferrous sulfate 325 (65 FE) MG tablet Take 1 tablet (325 mg total) by mouth 2 (two) times daily with a meal.   hydrALAZINE 100 MG tablet Commonly known as: APRESOLINE Take 1 tablet (100 mg total) by mouth every 8 (eight) hours. What changed:   medication strength  when to take this   hydrocerin Crea Apply 1 application topically daily.   insulin aspart protamine- aspart (70-30) 100 UNIT/ML injection Commonly known as: NOVOLOG MIX 70/30 Inject into the skin.   loratadine 10 MG tablet Commonly known as: CLARITIN Take 1 tablet (10 mg total) by mouth daily as needed for allergies.   multivitamin with minerals Tabs tablet Take 1 tablet by mouth daily.   polyethylene glycol 17 g packet Commonly known as: MIRALAX / GLYCOLAX Take 17 g by mouth daily.   Proctozone-HC 2.5 % rectal cream Generic drug: hydrocortisone Place 1 application rectally 2 (two) times daily as needed for hemorrhoids or anal itching.   sodium bicarbonate 650 MG tablet Take 2 tablets (1,300 mg total) by mouth 2 (two) times daily.            Discharge Care Instructions  (From admission, onward)         Start     Ordered   04/30/20 0000  Discharge wound care:       Comments: Wound care tro chronic, nonhealing LE  lesions:  Wash LEs with soap and water, rinse and pat dry. Apply a thin layer of Eucerin cream to legs. Cover lesions on left LE (medial and anterior) and right anterior LE with xeroform gauze, cover with ABD pad and wrap from just below toes to just below knees with Kerlix roll gauze/paper tape.  Place feet into [employer]. Change daily   04/30/20 1045          Allergies  Allergen Reactions  . Ace Inhibitors     Never prescribed but she has had angioedema  . Angiotensin Receptor Blockers     ARB never Rxed but PMH  of angioedema in March 2021    The results of significant diagnostics from this hospitalization (including imaging, microbiology, ancillary and laboratory) are listed below for reference.    Microbiology: Recent Results (from the past 240 hour(s))  Blood culture (routine x 2)     Status: Abnormal   Collection Time: 04/22/20  2:50 AM   Specimen: BLOOD RIGHT HAND  Result Value Ref Range Status   Specimen Description BLOOD RIGHT HAND  Final   Special Requests   Final    BOTTLES DRAWN AEROBIC AND ANAEROBIC Blood Culture results may not be optimal due to an inadequate volume of blood received in culture bottles   Culture  Setup Time   Final    IN BOTH AEROBIC AND ANAEROBIC BOTTLES GRAM POSITIVE COCCI IN CLUSTERS CRITICAL RESULT CALLED TO, READ BACK BY AND VERIFIED WITH: V BRYK PHARMD 04/23/20 0028 JDW    Culture (A)  Final    STAPHYLOCOCCUS XYLOSUS THE SIGNIFICANCE OF ISOLATING THIS ORGANISM FROM A SINGLE SET OF BLOOD CULTURES WHEN MULTIPLE SETS ARE DRAWN IS UNCERTAIN. PLEASE NOTIFY THE MICROBIOLOGY DEPARTMENT WITHIN ONE WEEK IF SPECIATION AND SENSITIVITIES ARE REQUIRED. Performed at [facility], [place] [address], [place], [place] [address]    Report Status 04/30/2020 FINAL  Final  Blood Culture ID Panel (Reflexed)     Status: Abnormal   Collection Time: 04/22/20  2:50 AM  Result Value Ref Range Status   Enterococcus faecalis NOT DETECTED NOT DETECTED Final    Enterococcus Faecium NOT DETECTED NOT DETECTED Final   Listeria monocytogenes NOT DETECTED NOT DETECTED Final   Staphylococcus species DETECTED (A) NOT DETECTED Final    Comment: CRITICAL RESULT CALLED TO, READ BACK BY AND VERIFIED WITH: V BRYK PHARMD 04/23/20 0028 JDW    Staphylococcus aureus (BCID) NOT DETECTED NOT DETECTED Final   Staphylococcus epidermidis NOT DETECTED NOT DETECTED Final   Staphylococcus lugdunensis NOT DETECTED NOT DETECTED Final   Streptococcus species NOT DETECTED NOT DETECTED Final   Streptococcus agalactiae NOT DETECTED NOT DETECTED Final   Streptococcus pneumoniae NOT DETECTED NOT DETECTED Final   Streptococcus pyogenes NOT DETECTED NOT DETECTED Final   A.calcoaceticus-baumannii NOT DETECTED NOT DETECTED Final   Bacteroides fragilis NOT DETECTED NOT DETECTED Final   Enterobacterales NOT DETECTED NOT DETECTED Final   Enterobacter cloacae complex NOT DETECTED NOT DETECTED Final   Escherichia coli NOT DETECTED NOT DETECTED Final   Klebsiella aerogenes NOT DETECTED NOT DETECTED Final   Klebsiella oxytoca NOT DETECTED NOT DETECTED Final   Klebsiella pneumoniae NOT DETECTED NOT DETECTED Final   Proteus species NOT DETECTED NOT DETECTED Final   Salmonella species NOT DETECTED NOT DETECTED Final   Serratia marcescens NOT DETECTED NOT DETECTED Final   Haemophilus influenzae NOT DETECTED NOT DETECTED Final   Neisseria meningitidis NOT DETECTED NOT DETECTED Final   Pseudomonas aeruginosa NOT DETECTED NOT DETECTED Final   Stenotrophomonas maltophilia NOT DETECTED NOT DETECTED Final   Candida albicans NOT DETECTED NOT DETECTED Final   Candida auris NOT DETECTED NOT DETECTED Final   Candida glabrata NOT DETECTED NOT DETECTED Final   Candida krusei NOT DETECTED NOT DETECTED Final   Candida parapsilosis NOT DETECTED NOT DETECTED Final  Candida tropicalis NOT DETECTED NOT DETECTED Final   Cryptococcus neoformans/gattii NOT DETECTED NOT DETECTED Final    Comment:  Performed at [facility], [place] [address], [place], [place] [address]  Respiratory Panel by RT PCR (Flu A&B, Covid) - Nasopharyngeal Swab     Status: None   Collection Time: 04/22/20  3:48 AM   Specimen: Nasopharyngeal Swab  Result Value Ref Range Status   SARS Coronavirus 2 by RT PCR NEGATIVE NEGATIVE Final    Comment: (NOTE) SARS-CoV-2 target nucleic acids are NOT DETECTED.  The SARS-CoV-2 RNA is generally detectable in upper respiratoy specimens during the acute phase of infection. The lowest concentration of SARS-CoV-2 viral copies this assay can detect is 131 copies/mL. A negative result does not preclude SARS-Cov-2 infection and should not be used as the sole basis for treatment or other patient management decisions. A negative result may occur with  improper specimen collection/handling, submission of specimen other than nasopharyngeal swab, presence of viral mutation(s) within the areas targeted by this assay, and inadequate number of viral copies (<131 copies/mL). A negative result must be combined with clinical observations, patient history, and epidemiological information. The expected result is Negative.  Fact Sheet for Patients:  PinkCheek.be  Fact Sheet for Healthcare Providers:  GravelBags.it  This test is no t yet approved or cleared by the [place] FDA and  has been authorized for detection and/or diagnosis of SARS-CoV-2 by FDA under an Emergency Use Authorization (EUA). This EUA will remain  in effect (meaning this test can be used) for the duration of the COVID-19 declaration under Section 564(b)(1) of the Act, 21 U.S.C. section 360bbb-3(b)(1), unless the authorization is terminated or revoked sooner.     Influenza A by PCR NEGATIVE NEGATIVE Final   Influenza B by PCR NEGATIVE NEGATIVE Final    Comment: (NOTE) The Xpert Xpress SARS-CoV-2/FLU/RSV assay is intended as an aid in  the diagnosis  of influenza from Nasopharyngeal swab specimens and  should not be used as a sole basis for treatment. Nasal washings and  aspirates are unacceptable for Xpert Xpress SARS-CoV-2/FLU/RSV  testing.  Fact Sheet for Patients: PinkCheek.be  Fact Sheet for Healthcare Providers: GravelBags.it  This test is not yet approved or cleared by the [place] FDA and  has been authorized for detection and/or diagnosis of SARS-CoV-2 by  FDA under an Emergency Use Authorization (EUA). This EUA will remain  in effect (meaning this test can be used) for the duration of the  Covid-19 declaration under Section 564(b)(1) of the Act, 21  U.S.C. section 360bbb-3(b)(1), unless the authorization is  terminated or revoked. Performed at [facility], [place] [address], [place], [place] [address]   Blood culture (routine x 2)     Status: None   Collection Time: 04/22/20 10:00 AM   Specimen: BLOOD  Result Value Ref Range Status   Specimen Description BLOOD SITE NOT SPECIFIED  Final   Special Requests   Final    BOTTLES DRAWN AEROBIC AND ANAEROBIC Blood Culture adequate volume   Culture   Final    NO GROWTH 5 DAYS Performed at [facility], [address] [place], [place] [address]    Report Status 04/27/2020 FINAL  Final  Culture, blood (routine x 2)     Status: None   Collection Time: 04/23/20  6:37 AM   Specimen: BLOOD LEFT ARM  Result Value Ref Range Status   Specimen Description BLOOD LEFT ARM  Final   Special  Requests   Final    BOTTLES DRAWN AEROBIC AND ANAEROBIC Blood Culture adequate volume   Culture   Final    NO GROWTH 5 DAYS Performed at [facility], [place] [address] [place], [place] [address]    Report Status 04/28/2020 FINAL  Final  Culture, blood (routine x 2)     Status: None   Collection Time: 04/23/20  6:48 AM   Specimen: BLOOD LEFT HAND  Result Value Ref Range Status   Specimen Description BLOOD LEFT HAND   Final   Special Requests   Final    BOTTLES DRAWN AEROBIC AND ANAEROBIC Blood Culture adequate volume   Culture   Final    NO GROWTH 5 DAYS Performed at [facility], [place] [address], [place], [place] [address]    Report Status 04/28/2020 FINAL  Final  Respiratory Panel by RT PCR (Flu A&B, Covid) - Nasopharyngeal Swab     Status: None   Collection Time: 04/29/20 12:17 PM   Specimen: Nasopharyngeal Swab  Result Value Ref Range Status   SARS Coronavirus 2 by RT PCR NEGATIVE NEGATIVE Final    Comment: (NOTE) SARS-CoV-2 target nucleic acids are NOT DETECTED.  The SARS-CoV-2 RNA is generally detectable in upper respiratoy specimens during the acute phase of infection. The lowest concentration of SARS-CoV-2 viral copies this assay can detect is 131 copies/mL. A negative result does not preclude SARS-Cov-2 infection and should not be used as the sole basis for treatment or other patient management decisions. A negative result may occur with  improper specimen collection/handling, submission of specimen other than nasopharyngeal swab, presence of viral mutation(s) within the areas targeted by this assay, and inadequate number of viral copies (<131 copies/mL). A negative result must be combined with clinical observations, patient history, and epidemiological information. The expected result is Negative.  Fact Sheet for Patients:  PinkCheek.be  Fact Sheet for Healthcare Providers:  GravelBags.it  This test is no t yet approved or cleared by the [place] FDA and  has been authorized for detection and/or diagnosis of SARS-CoV-2 by FDA under an Emergency Use Authorization (EUA). This EUA will remain  in effect (meaning this test can be used) for the duration of the COVID-19 declaration under Section 564(b)(1) of the Act, 21 U.S.C. section 360bbb-3(b)(1), unless the authorization is terminated or revoked sooner.      Influenza A by PCR NEGATIVE NEGATIVE Final   Influenza B by PCR NEGATIVE NEGATIVE Final    Comment: (NOTE) The Xpert Xpress SARS-CoV-2/FLU/RSV assay is intended as an aid in  the diagnosis of influenza from Nasopharyngeal swab specimens and  should not be used as a sole basis for treatment. Nasal washings and  aspirates are unacceptable for Xpert Xpress SARS-CoV-2/FLU/RSV  testing.  Fact Sheet for Patients: PinkCheek.be  Fact Sheet for Healthcare Providers: GravelBags.it  This test is not yet approved or cleared by the [place] FDA and  has been authorized for detection and/or diagnosis of SARS-CoV-2 by  FDA under an Emergency Use Authorization (EUA). This EUA will remain  in effect (meaning this test can be used) for the duration of the  Covid-19 declaration under Section 564(b)(1) of the Act, 21  U.S.C. section 360bbb-3(b)(1), unless the authorization is  terminated or revoked. Performed at [facility], [place] [address] [place], [place] [address]     Procedures/Studies: DG Chest 2 View  Result Date: 04/22/2020 CLINICAL DATA:  Recurrent staph infection EXAM: CHEST - 2 VIEW COMPARISON:  02/14/2020 FINDINGS: Small right pleural effusion has developed, new since prior examination mild associated right basilar atelectasis. Lungs are otherwise clear. No pneumothorax. No pleural effusion on the left. Moderate cardiomegaly is stable. Pulmonary vascularity is normal. Healing left proximal humeral fracture again noted. No acute bone abnormality. IMPRESSION: Interval development of small right pleural effusion. Stable moderate cardiomegaly. Electronically Signed   By: Fidela Salisbury MD   On: 04/22/2020 01:33   US RENAL  Result Date: 04/29/2020 CLINICAL DATA:  Acute kidney injury. EXAM: RENAL / URINARY TRACT ULTRASOUND COMPLETE COMPARISON:  None. FINDINGS: Right Kidney: Renal measurements: 11.2 x 6.2 x 5.5 cm = volume: 197  mL. Increased echogenicity is noted suggesting medical renal disease. No mass or hydronephrosis visualized. Left Kidney: Renal measurements: 6.1 x 3.8 x 2.6 cm = volume: 32 mL. Increased echogenicity of renal parenchyma is noted suggesting medical renal disease. No mass or hydronephrosis visualized. Bladder: Appears normal for degree of bladder distention. Other: Mild ascites is noted. IMPRESSION: Increased echogenicity of renal parenchyma is noted bilaterally suggesting medical renal disease. Severe left renal atrophy is noted. No hydronephrosis or renal obstruction is noted. Mild ascites. Electronically Signed   By: Marijo Conception M.D.   On: 04/29/2020 15:07   VAS [place] ABI WITH/WO TBI  Result Date: 04/22/2020 LOWER EXTREMITY DOPPLER STUDY Indications: Claudication, and rest pain. High Risk Factors: Hypertension, hyperlipidemia, Diabetes.  Performing Technologist: Griffin Basil RCT RDMS  Examination Guidelines: A complete evaluation includes at minimum, Doppler waveform signals and systolic blood pressure reading at the level of bilateral brachial, anterior tibial, and posterior tibial arteries, when vessel segments are accessible. Bilateral testing is considered an integral part of a complete examination. Photoelectric Plethysmograph (PPG) waveforms and toe systolic pressure readings are included as required and additional duplex testing as needed. Limited examinations for reoccurring indications may be performed as noted.  ABI Findings: +---------+------------------+-----+--------+--------+ Right    Rt Pressure (mmHg)IndexWaveformComment  +---------+------------------+-----+--------+--------+ Brachial 200                                     +---------+------------------+-----+--------+--------+ PTA      199               0.97                  +---------+------------------+-----+--------+--------+ DP       205               1.00                   +---------+------------------+-----+--------+--------+ Great Toe215               1.04                  +---------+------------------+-----+--------+--------+ +---------+------------------+-----+--------+-------+ Left     Lt Pressure (mmHg)IndexWaveformComment +---------+------------------+-----+--------+-------+ Brachial 206                                    +---------+------------------+-----+--------+-------+ PTA      233               1.13                 +---------+------------------+-----+--------+-------+ DP       203               0.99                 +---------+------------------+-----+--------+-------+  Great Toe216               1.05                 +---------+------------------+-----+--------+-------+ TOES Findings: +----------+---------------+--------+-------+ Right ToesPressure (mmHg)WaveformComment +----------+---------------+--------+-------+ 1st Digit 215            Normal          +----------+---------------+--------+-------+  +---------+---------------+--------+-------+ Left ToesPressure (mmHg)WaveformComment +---------+---------------+--------+-------+ 1st [id number]            Normal          +---------+---------------+--------+-------+    Summary: Right: Resting right ankle-brachial index is within normal range. No evidence of significant right lower extremity arterial disease. Left: Resting left ankle-brachial index is within normal range. No evidence of significant left lower extremity arterial disease.  *See table(s) above for measurements and observations.  Electronically signed by Curt Jews MD on 04/22/2020 at 3:06:39 PM.    Final     Labs: BNP (last 3 results) Recent Labs    11/08/19 0500 11/09/19 0430 02/14/20 2012  BNP 573.1* 1,043.9* 3,754.3*   Basic Metabolic Panel: Recent Labs  Lab 04/24/20 0016 04/25/20 0120 04/26/20 0129 04/29/20 0123 04/30/20 0235  NA 141 142 142 142 144  K 4.0 4.0 4.0 4.4 4.4  CL 111  112* 110 113* 116*  CO2 20* 20* 21* 19* 19*  GLUCOSE 129* 160* 157* 153* 129*  BUN 41* 49* 57* 82* 82*  CREATININE 2.56* 2.34* 2.60* 2.79* 2.62*  CALCIUM 7.9* 7.9* 8.2* 8.4* 8.6*   Liver Function Tests: Recent Labs  Lab 04/29/20 0123  AST 28  ALT 14  ALKPHOS 99  BILITOT 0.5  PROT 6.9  ALBUMIN 2.5*   No results for input(s): LIPASE, AMYLASE in the last 168 hours. No results for input(s): AMMONIA in the last 168 hours. CBC: Recent Labs  Lab 04/24/20 0016 04/26/20 0129 04/29/20 0123 04/30/20 0235  WBC 4.3 4.9 4.3 6.2  NEUTROABS 3.0 3.6 3.1  --   HGB 7.9* 8.3* 7.9* 8.1*  HCT 25.8* 27.5* 27.1* 27.2*  MCV 89.0 89.9 94.8 92.8  PLT 224 243 258 253   Cardiac Enzymes: No results for input(s): CKTOTAL, CKMB, CKMBINDEX, TROPONINI in the last 168 hours. BNP: Invalid input(s): POCBNP CBG: Recent Labs  Lab 04/29/20 0806 04/29/20 1137 04/29/20 1657 04/29/20 2052 04/30/20 0740  GLUCAP 120* 132* 126* 138* 140*   D-Dimer No results for input(s): DDIMER in the last 72 hours. Hgb A1c No results for input(s): HGBA1C in the last 72 hours. Lipid Profile No results for input(s): CHOL, HDL, LDLCALC, TRIG, CHOLHDL, LDLDIRECT in the last 72 hours. Thyroid function studies No results for input(s): TSH, T4TOTAL, T3FREE, THYROIDAB in the last 72 hours.  Invalid input(s): FREET3 Anemia work up No results for input(s): VITAMINB12, FOLATE, FERRITIN, TIBC, IRON, RETICCTPCT in the last 72 hours. Urinalysis    Component Value Date/Time   COLORURINE AMBER (A) 04/29/2020 1648   APPEARANCEUR HAZY (A) 04/29/2020 1648   LABSPEC 1.017 04/29/2020 1648   PHURINE 5.0 04/29/2020 1648   GLUCOSEU NEGATIVE 04/29/2020 1648   HGBUR LARGE (A) 04/29/2020 1648   BILIRUBINUR NEGATIVE 04/29/2020 1648   KETONESUR NEGATIVE 04/29/2020 1648   PROTEINUR 100 (A) 04/29/2020 1648   UROBILINOGEN 0.2 03/06/2014 1644   NITRITE NEGATIVE 04/29/2020 1648   LEUKOCYTESUR TRACE (A) 04/29/2020 1648   Sepsis  Labs Invalid input(s): PROCALCITONIN,  WBC,  LACTICIDVEN Microbiology Recent Results (from the past 240 hour(s))  Blood culture (routine x 2)  Status: Abnormal   Collection Time: 04/22/20  2:50 AM   Specimen: BLOOD RIGHT HAND  Result Value Ref Range Status   Specimen Description BLOOD RIGHT HAND  Final   Special Requests   Final    BOTTLES DRAWN AEROBIC AND ANAEROBIC Blood Culture results may not be optimal due to an inadequate volume of blood received in culture bottles   Culture  Setup Time   Final    IN BOTH AEROBIC AND ANAEROBIC BOTTLES GRAM POSITIVE COCCI IN CLUSTERS CRITICAL RESULT CALLED TO, READ BACK BY AND VERIFIED WITH: V BRYK PHARMD 04/23/20 0028 JDW    Culture (A)  Final    STAPHYLOCOCCUS XYLOSUS THE SIGNIFICANCE OF ISOLATING THIS ORGANISM FROM A SINGLE SET OF BLOOD CULTURES WHEN MULTIPLE SETS ARE DRAWN IS UNCERTAIN. PLEASE NOTIFY THE MICROBIOLOGY DEPARTMENT WITHIN ONE WEEK IF SPECIATION AND SENSITIVITIES ARE REQUIRED. Performed at [facility], [place] [address] [place], [place] [address]    Report Status 04/30/2020 FINAL  Final  Blood Culture ID Panel (Reflexed)     Status: Abnormal   Collection Time: 04/22/20  2:50 AM  Result Value Ref Range Status   Enterococcus faecalis NOT DETECTED NOT DETECTED Final   Enterococcus Faecium NOT DETECTED NOT DETECTED Final   Listeria monocytogenes NOT DETECTED NOT DETECTED Final   Staphylococcus species DETECTED (A) NOT DETECTED Final    Comment: CRITICAL RESULT CALLED TO, READ BACK BY AND VERIFIED WITH: V BRYK PHARMD 04/23/20 0028 JDW    Staphylococcus aureus (BCID) NOT DETECTED NOT DETECTED Final   Staphylococcus epidermidis NOT DETECTED NOT DETECTED Final   Staphylococcus lugdunensis NOT DETECTED NOT DETECTED Final   Streptococcus species NOT DETECTED NOT DETECTED Final   Streptococcus agalactiae NOT DETECTED NOT DETECTED Final   Streptococcus pneumoniae NOT DETECTED NOT DETECTED Final   Streptococcus pyogenes NOT  DETECTED NOT DETECTED Final   A.calcoaceticus-baumannii NOT DETECTED NOT DETECTED Final   Bacteroides fragilis NOT DETECTED NOT DETECTED Final   Enterobacterales NOT DETECTED NOT DETECTED Final   Enterobacter cloacae complex NOT DETECTED NOT DETECTED Final   Escherichia coli NOT DETECTED NOT DETECTED Final   Klebsiella aerogenes NOT DETECTED NOT DETECTED Final   Klebsiella oxytoca NOT DETECTED NOT DETECTED Final   Klebsiella pneumoniae NOT DETECTED NOT DETECTED Final   Proteus species NOT DETECTED NOT DETECTED Final   Salmonella species NOT DETECTED NOT DETECTED Final   Serratia marcescens NOT DETECTED NOT DETECTED Final   Haemophilus influenzae NOT DETECTED NOT DETECTED Final   Neisseria meningitidis NOT DETECTED NOT DETECTED Final   Pseudomonas aeruginosa NOT DETECTED NOT DETECTED Final   Stenotrophomonas maltophilia NOT DETECTED NOT DETECTED Final   Candida albicans NOT DETECTED NOT DETECTED Final   Candida auris NOT DETECTED NOT DETECTED Final   Candida glabrata NOT DETECTED NOT DETECTED Final   Candida krusei NOT DETECTED NOT DETECTED Final   Candida parapsilosis NOT DETECTED NOT DETECTED Final   Candida tropicalis NOT DETECTED NOT DETECTED Final   Cryptococcus neoformans/gattii NOT DETECTED NOT DETECTED Final    Comment: Performed at [facility], [address], [place], [place] [address]  Respiratory Panel by RT PCR (Flu A&B, Covid) - Nasopharyngeal Swab     Status: None   Collection Time: 04/22/20  3:48 AM   Specimen: Nasopharyngeal Swab  Result Value Ref Range Status   SARS Coronavirus 2 by RT PCR NEGATIVE NEGATIVE Final    Comment: (NOTE) SARS-CoV-2 target nucleic acids are NOT DETECTED.  The SARS-CoV-2 RNA is generally detectable  in upper respiratoy specimens during the acute phase of infection. The lowest concentration of SARS-CoV-2 viral copies this assay can detect is 131 copies/mL. A negative result does not preclude SARS-Cov-2 infection and should not be  used as the sole basis for treatment or other patient management decisions. A negative result may occur with  improper specimen collection/handling, submission of specimen other than nasopharyngeal swab, presence of viral mutation(s) within the areas targeted by this assay, and inadequate number of viral copies (<131 copies/mL). A negative result must be combined with clinical observations, patient history, and epidemiological information. The expected result is Negative.  Fact Sheet for Patients:  PinkCheek.be  Fact Sheet for Healthcare Providers:  GravelBags.it  This test is no t yet approved or cleared by the [place] FDA and  has been authorized for detection and/or diagnosis of SARS-CoV-2 by FDA under an Emergency Use Authorization (EUA). This EUA will remain  in effect (meaning this test can be used) for the duration of the COVID-19 declaration under Section 564(b)(1) of the Act, 21 U.S.C. section 360bbb-3(b)(1), unless the authorization is terminated or revoked sooner.     Influenza A by PCR NEGATIVE NEGATIVE Final   Influenza B by PCR NEGATIVE NEGATIVE Final    Comment: (NOTE) The Xpert Xpress SARS-CoV-2/FLU/RSV assay is intended as an aid in  the diagnosis of influenza from Nasopharyngeal swab specimens and  should not be used as a sole basis for treatment. Nasal washings and  aspirates are unacceptable for Xpert Xpress SARS-CoV-2/FLU/RSV  testing.  Fact Sheet for Patients: PinkCheek.be  Fact Sheet for Healthcare Providers: GravelBags.it  This test is not yet approved or cleared by the [place] FDA and  has been authorized for detection and/or diagnosis of SARS-CoV-2 by  FDA under an Emergency Use Authorization (EUA). This EUA will remain  in effect (meaning this test can be used) for the duration of the  Covid-19 declaration under Section  564(b)(1) of the Act, 21  U.S.C. section 360bbb-3(b)(1), unless the authorization is  terminated or revoked. Performed at [facility], [place] [address], [place], [place] [address]   Blood culture (routine x 2)     Status: None   Collection Time: 04/22/20 10:00 AM   Specimen: BLOOD  Result Value Ref Range Status   Specimen Description BLOOD SITE NOT SPECIFIED  Final   Special Requests   Final    BOTTLES DRAWN AEROBIC AND ANAEROBIC Blood Culture adequate volume   Culture   Final    NO GROWTH 5 DAYS Performed at [facility], [address], [place], [place] [address]    Report Status 04/27/2020 FINAL  Final  Culture, blood (routine x 2)     Status: None   Collection Time: 04/23/20  6:37 AM   Specimen: BLOOD LEFT ARM  Result Value Ref Range Status   Specimen Description BLOOD LEFT ARM  Final   Special Requests   Final    BOTTLES DRAWN AEROBIC AND ANAEROBIC Blood Culture adequate volume   Culture   Final    NO GROWTH 5 DAYS Performed at [facility], [place] [address] [place], [place] [address]    Report Status 04/28/2020 FINAL  Final  Culture, blood (routine x 2)     Status: None   Collection Time: 04/23/20  6:48 AM   Specimen: BLOOD LEFT HAND  Result Value Ref Range Status   Specimen Description BLOOD LEFT HAND  Final   Special Requests   Final  BOTTLES DRAWN AEROBIC AND ANAEROBIC Blood Culture adequate volume   Culture   Final    NO GROWTH 5 DAYS Performed at [facility], [place] [address] [place], [place] [address]    Report Status 04/28/2020 FINAL  Final  Respiratory Panel by RT PCR (Flu A&B, Covid) - Nasopharyngeal Swab     Status: None   Collection Time: 04/29/20 12:17 PM   Specimen: Nasopharyngeal Swab  Result Value Ref Range Status   SARS Coronavirus 2 by RT PCR NEGATIVE NEGATIVE Final    Comment: (NOTE) SARS-CoV-2 target nucleic acids are NOT DETECTED.  The SARS-CoV-2 RNA is generally detectable in upper respiratoy specimens during the  acute phase of infection. The lowest concentration of SARS-CoV-2 viral copies this assay can detect is 131 copies/mL. A negative result does not preclude SARS-Cov-2 infection and should not be used as the sole basis for treatment or other patient management decisions. A negative result may occur with  improper specimen collection/handling, submission of specimen other than nasopharyngeal swab, presence of viral mutation(s) within the areas targeted by this assay, and inadequate number of viral copies (<131 copies/mL). A negative result must be combined with clinical observations, patient history, and epidemiological information. The expected result is Negative.  Fact Sheet for Patients:  PinkCheek.be  Fact Sheet for Healthcare Providers:  GravelBags.it  This test is no t yet approved or cleared by the [place] FDA and  has been authorized for detection and/or diagnosis of SARS-CoV-2 by FDA under an Emergency Use Authorization (EUA). This EUA will remain  in effect (meaning this test can be used) for the duration of the COVID-19 declaration under Section 564(b)(1) of the Act, 21 U.S.C. section 360bbb-3(b)(1), unless the authorization is terminated or revoked sooner.     Influenza A by PCR NEGATIVE NEGATIVE Final   Influenza B by PCR NEGATIVE NEGATIVE Final    Comment: (NOTE) The Xpert Xpress SARS-CoV-2/FLU/RSV assay is intended as an aid in  the diagnosis of influenza from Nasopharyngeal swab specimens and  should not be used as a sole basis for treatment. Nasal washings and  aspirates are unacceptable for Xpert Xpress SARS-CoV-2/FLU/RSV  testing.  Fact Sheet for Patients: PinkCheek.be  Fact Sheet for Healthcare Providers: GravelBags.it  This test is not yet approved or cleared by the [place] FDA and  has been authorized for detection and/or diagnosis  of SARS-CoV-2 by  FDA under an Emergency Use Authorization (EUA). This EUA will remain  in effect (meaning this test can be used) for the duration of the  Covid-19 declaration under Section 564(b)(1) of the Act, 21  U.S.C. section 360bbb-3(b)(1), unless the authorization is  terminated or revoked. Performed at [facility], [place] [address], [place], [place] [address]    Time coordinating discharge: 35  minutes  SIGNED: Antonieta Pert, MD  [facility] 04/30/2020, 10:45 AM  If 7PM-7AM, please contact night-coverage [email or web address]

## 2020-04-30 NOTE — Progress Notes (Signed)
Pt is ready for DC to [facility] via [place], has had dressing change for bilateral LEs done, report has been given and has all belongings and supplies for [facility]. Pt has eaten lunch and had SSI coverage.

## 2020-04-30 NOTE — TOC Transition Note (Signed)
Transition of Care [facility]) - CM/SW Discharge Note   Patient Details  Name: Vanessa [name] MRN: [id number] Date of Birth: 1956-06-26  [facility] CM/SW Contact:  Bethann Berkshire, [place] Phone Number: 04/30/2020, 11:34 AM   Clinical Narrative:     Patient will DC to: [facility] Anticipated DC date: 04/30/20 Family notified: Pt wants to notify family herself Transport by: Corey Harold   Per MD patient ready for DC to [facility] . RN, patient, and facility notified of DC. Discharge Summary and FL2 sent to facility. RN to call report prior to discharge [phone] room 112). DC packet on chart. Ambulance transport requested for patient.   CSW will sign off for now as social work intervention is no longer needed. Please consult [place] again if new needs arise.   Final next level of care: [place] Barriers to Discharge: No Barriers Identified   Patient Goals and CMS Choice Patient states their goals for this hospitalization and ongoing recovery are:: [employer] Medicare.gov Compare Post Acute Care list provided to:: Patient Choice offered to / list presented to : Patient  Discharge Placement              Patient [facility] bed at: [facility] Patient to be transferred to facility by: [place] Name of family member notified: Pt wants to notify herself    Discharge Plan and Services   Discharge Planning Services: CM Consult Post Acute Care Choice: Home Health          DME Arranged: N/A         HH Arranged: RN [place] Agency: [place] Date [place]: 04/22/20 Time [place]: [place] Representative spoke with at [place]: Tommi Rumps , patient will need resumption of care orders  Social Determinants of Health (SDOH) Interventions     Readmission Risk Interventions Readmission Risk Prevention Plan 11/08/2019 10/21/2019 09/25/2019  Transportation Screening Complete Complete Complete  PCP or Specialist  Appt within 3-5 Days - - Not Complete  Not Complete comments - - plan for SNF  HRI or [place] - - Complete  Social Work Consult for [place] Planning/Counseling - - Complete  Palliative Care Screening - - Not Applicable  Medication Review Press photographer) Complete Referral to Pharmacy Referral to Pharmacy  PCP or Specialist appointment within 3-5 days of discharge Complete Complete -  HRI or Home Care Consult Complete (No Data) -  SW Recovery Care/Counseling Consult Complete Complete -  Palliative Care Screening Not Applicable Not Applicable -  Skilled Nursing Facility Complete Complete -  Some recent data might be hidden

## 2020-05-11 DIAGNOSIS — N939 Abnormal uterine and vaginal bleeding, unspecified: Secondary | ICD-10-CM

## 2020-05-11 HISTORY — DX: Abnormal uterine and vaginal bleeding, unspecified: N93.9

## 2020-05-22 ENCOUNTER — Telehealth: Payer: Self-pay | Admitting: Oncology

## 2020-05-22 ENCOUNTER — Telehealth: Payer: Self-pay

## 2020-05-22 NOTE — Telephone Encounter (Signed)
[employer] and advised them of appointments on 05/25/20.  They said they will arrange transportation.

## 2020-05-22 NOTE — Telephone Encounter (Signed)
TC to patient to review meaningful use for Monday's appointment.  Spoke to husband who reports patient was recently admitted to a nursing home, possibly [facility] (he is unsure and has not had contact w/ patient)  for issues with her legs.  He is unaware if patient will keep appointment and is unable to provide anymore information.

## 2020-05-24 ENCOUNTER — Encounter (HOSPITAL_COMMUNITY): Payer: Self-pay

## 2020-05-24 ENCOUNTER — Inpatient Hospital Stay (HOSPITAL_COMMUNITY)
Admission: EM | Admit: 2020-05-24 | Discharge: 2020-06-09 | DRG: 755 | Disposition: A | Discharge status: Home / Self Care | Payer: Medicare Other | Source: Ambulatory Visit | Attending: Internal Medicine | Admitting: Internal Medicine

## 2020-05-24 ENCOUNTER — Other Ambulatory Visit: Payer: Self-pay

## 2020-05-24 DIAGNOSIS — N183 Chronic kidney disease, stage 3 unspecified: Secondary | ICD-10-CM | POA: Diagnosis present

## 2020-05-24 DIAGNOSIS — D630 Anemia in neoplastic disease: Secondary | ICD-10-CM | POA: Diagnosis present

## 2020-05-24 DIAGNOSIS — Z87891 Personal history of nicotine dependence: Secondary | ICD-10-CM

## 2020-05-24 DIAGNOSIS — Z833 Family history of diabetes mellitus: Secondary | ICD-10-CM

## 2020-05-24 DIAGNOSIS — E119 Type 2 diabetes mellitus without complications: Secondary | ICD-10-CM

## 2020-05-24 DIAGNOSIS — I129 Hypertensive chronic kidney disease with stage 1 through stage 4 chronic kidney disease, or unspecified chronic kidney disease: Secondary | ICD-10-CM | POA: Diagnosis present

## 2020-05-24 DIAGNOSIS — N939 Abnormal uterine and vaginal bleeding, unspecified: Secondary | ICD-10-CM

## 2020-05-24 DIAGNOSIS — Z7982 Long term (current) use of aspirin: Secondary | ICD-10-CM

## 2020-05-24 DIAGNOSIS — D631 Anemia in chronic kidney disease: Secondary | ICD-10-CM | POA: Diagnosis present

## 2020-05-24 DIAGNOSIS — Z6841 Body Mass Index (BMI) 40.0 and over, adult: Secondary | ICD-10-CM

## 2020-05-24 DIAGNOSIS — D62 Acute posthemorrhagic anemia: Secondary | ICD-10-CM | POA: Diagnosis present

## 2020-05-24 DIAGNOSIS — R001 Bradycardia, unspecified: Secondary | ICD-10-CM | POA: Diagnosis present

## 2020-05-24 DIAGNOSIS — Z9119 Patient's noncompliance with other medical treatment and regimen: Secondary | ICD-10-CM

## 2020-05-24 DIAGNOSIS — C541 Malignant neoplasm of endometrium: Secondary | ICD-10-CM | POA: Diagnosis not present

## 2020-05-24 DIAGNOSIS — E1122 Type 2 diabetes mellitus with diabetic chronic kidney disease: Secondary | ICD-10-CM | POA: Diagnosis present

## 2020-05-24 DIAGNOSIS — R609 Edema, unspecified: Secondary | ICD-10-CM | POA: Diagnosis present

## 2020-05-24 DIAGNOSIS — E785 Hyperlipidemia, unspecified: Secondary | ICD-10-CM | POA: Diagnosis present

## 2020-05-24 DIAGNOSIS — R269 Unspecified abnormalities of gait and mobility: Secondary | ICD-10-CM | POA: Diagnosis present

## 2020-05-24 DIAGNOSIS — D649 Anemia, unspecified: Secondary | ICD-10-CM | POA: Diagnosis present

## 2020-05-24 DIAGNOSIS — Z79899 Other long term (current) drug therapy: Secondary | ICD-10-CM

## 2020-05-24 DIAGNOSIS — Z823 Family history of stroke: Secondary | ICD-10-CM

## 2020-05-24 DIAGNOSIS — E876 Hypokalemia: Secondary | ICD-10-CM | POA: Diagnosis not present

## 2020-05-24 DIAGNOSIS — Z8249 Family history of ischemic heart disease and other diseases of the circulatory system: Secondary | ICD-10-CM

## 2020-05-24 DIAGNOSIS — Z8616 Personal history of COVID-19: Secondary | ICD-10-CM

## 2020-05-24 DIAGNOSIS — Z8614 Personal history of Methicillin resistant Staphylococcus aureus infection: Secondary | ICD-10-CM

## 2020-05-24 DIAGNOSIS — Z794 Long term (current) use of insulin: Secondary | ICD-10-CM

## 2020-05-24 DIAGNOSIS — D500 Iron deficiency anemia secondary to blood loss (chronic): Secondary | ICD-10-CM | POA: Diagnosis present

## 2020-05-24 DIAGNOSIS — Z888 Allergy status to other drugs, medicaments and biological substances status: Secondary | ICD-10-CM

## 2020-05-24 DIAGNOSIS — K649 Unspecified hemorrhoids: Secondary | ICD-10-CM | POA: Diagnosis present

## 2020-05-24 DIAGNOSIS — Z20822 Contact with and (suspected) exposure to covid-19: Secondary | ICD-10-CM | POA: Diagnosis present

## 2020-05-24 DIAGNOSIS — N184 Chronic kidney disease, stage 4 (severe): Secondary | ICD-10-CM | POA: Diagnosis present

## 2020-05-24 DIAGNOSIS — L89152 Pressure ulcer of sacral region, stage 2: Secondary | ICD-10-CM | POA: Diagnosis present

## 2020-05-24 DIAGNOSIS — L89892 Pressure ulcer of other site, stage 2: Secondary | ICD-10-CM | POA: Diagnosis not present

## 2020-05-24 DIAGNOSIS — D72819 Decreased white blood cell count, unspecified: Secondary | ICD-10-CM | POA: Diagnosis not present

## 2020-05-24 LAB — BASIC METABOLIC PANEL
Anion gap: 10 (ref 5–15)
BUN: 38 mg/dL — ABNORMAL HIGH (ref 8–23)
CO2: 25 mmol/L (ref 22–32)
Calcium: 8 mg/dL — ABNORMAL LOW (ref 8.9–10.3)
Chloride: 110 mmol/L (ref 98–111)
Creatinine, Ser: 2.55 mg/dL — ABNORMAL HIGH (ref 0.44–1.00)
GFR, Estimated: 20 mL/min — ABNORMAL LOW (ref >60)
Glucose, Bld: 113 mg/dL — ABNORMAL HIGH (ref 70–99)
Potassium: 3.5 mmol/L (ref 3.5–5.1)
Sodium: 145 mmol/L (ref 135–145)

## 2020-05-24 LAB — CBC
HCT: 20.7 % — ABNORMAL LOW (ref 36.0–46.0)
Hemoglobin: 6 g/dL — CL (ref 12.0–15.0)
MCH: 28 pg (ref 26.0–34.0)
MCHC: 29 g/dL — ABNORMAL LOW (ref 30.0–36.0)
MCV: 96.7 fL (ref 80.0–100.0)
Platelets: 183 10*3/uL (ref 150–400)
RBC: 2.14 MIL/uL — ABNORMAL LOW (ref 3.87–5.11)
RDW: 18.2 % — ABNORMAL HIGH (ref 11.5–15.5)
WBC: 5.2 10*3/uL (ref 4.0–10.5)
nRBC: 0 % (ref 0.0–0.2)

## 2020-05-24 LAB — RESPIRATORY PANEL BY RT PCR (FLU A&B, COVID)
Influenza A by PCR: NEGATIVE
Influenza B by PCR: NEGATIVE
SARS Coronavirus 2 by RT PCR: NEGATIVE

## 2020-05-24 LAB — PREPARE RBC (CROSSMATCH)

## 2020-05-24 MED ORDER — JUVEN PO PACK
1.0000 | PACK | Freq: Two times a day (BID) | ORAL | Status: DC
  Administered 2020-05-24 – 2020-06-09 (×24): 1 via ORAL
  Filled 2020-05-24 (×32): qty 1

## 2020-05-24 MED ORDER — HYDROCORTISONE ACETATE 25 MG RE SUPP
25.0000 mg | Freq: Every day | RECTAL | Status: DC
  Administered 2020-05-25 – 2020-06-06 (×7): 25 mg via RECTAL
  Filled 2020-05-24 (×16): qty 1

## 2020-05-24 MED ORDER — SODIUM CHLORIDE 0.9% IV SOLUTION: Freq: Once | INTRAVENOUS | Status: AC

## 2020-05-24 MED ORDER — CLONIDINE HCL 0.2 MG PO TABS
0.2000 mg | ORAL_TABLET | Freq: Two times a day (BID) | ORAL | Status: DC
  Administered 2020-05-24 – 2020-06-09 (×33): 0.2 mg via ORAL
  Filled 2020-05-24 (×33): qty 1

## 2020-05-24 MED ORDER — LORATADINE 10 MG PO TABS
10.0000 mg | ORAL_TABLET | Freq: Every day | ORAL | Status: DC | PRN
  Administered 2020-06-03 – 2020-06-08 (×2): 10 mg via ORAL
  Filled 2020-05-24 (×2): qty 1

## 2020-05-24 MED ORDER — SODIUM BICARBONATE 650 MG PO TABS
1300.0000 mg | ORAL_TABLET | Freq: Two times a day (BID) | ORAL | Status: DC
  Administered 2020-05-24 – 2020-06-09 (×33): 1300 mg via ORAL
  Filled 2020-05-24 (×35): qty 2

## 2020-05-24 MED ORDER — DIPHENHYDRAMINE HCL 25 MG PO CAPS
25.0000 mg | ORAL_CAPSULE | Freq: Three times a day (TID) | ORAL | Status: DC
  Administered 2020-05-24 – 2020-06-09 (×47): 25 mg via ORAL
  Filled 2020-05-24 (×49): qty 1

## 2020-05-24 MED ORDER — LEVOFLOXACIN 750 MG PO TABS: 750.0000 mg | ORAL_TABLET | ORAL | Status: DC

## 2020-05-24 MED ORDER — HYDROCERIN EX CREA
1.0000 "application " | TOPICAL_CREAM | Freq: Every day | CUTANEOUS | Status: DC
  Administered 2020-05-24 – 2020-06-09 (×17): 1 via TOPICAL
  Filled 2020-05-24 (×2): qty 113

## 2020-05-24 MED ORDER — FERROUS SULFATE 325 (65 FE) MG PO TABS
325.0000 mg | ORAL_TABLET | Freq: Two times a day (BID) | ORAL | Status: DC
  Administered 2020-05-24 – 2020-06-09 (×33): 325 mg via ORAL
  Filled 2020-05-24 (×33): qty 1

## 2020-05-24 MED ORDER — AMLODIPINE BESYLATE 10 MG PO TABS
10.0000 mg | ORAL_TABLET | Freq: Every day | ORAL | Status: DC
  Administered 2020-05-25 – 2020-06-09 (×16): 10 mg via ORAL
  Filled 2020-05-24 (×16): qty 1

## 2020-05-24 MED ORDER — METOLAZONE 5 MG PO TABS
5.0000 mg | ORAL_TABLET | Freq: Every day | ORAL | Status: DC
  Administered 2020-05-25 – 2020-06-09 (×16): 5 mg via ORAL
  Filled 2020-05-24 (×16): qty 1

## 2020-05-24 MED ORDER — POLYETHYLENE GLYCOL 3350 17 G PO PACK
17.0000 g | PACK | Freq: Every day | ORAL | Status: DC
  Administered 2020-05-25 – 2020-06-06 (×11): 17 g via ORAL
  Filled 2020-05-24 (×12): qty 1

## 2020-05-24 MED ORDER — FUROSEMIDE 40 MG PO TABS
60.0000 mg | ORAL_TABLET | Freq: Two times a day (BID) | ORAL | Status: DC
  Administered 2020-05-24 – 2020-06-09 (×32): 60 mg via ORAL
  Filled 2020-05-24 (×10): qty 1
  Filled 2020-05-24: qty 3
  Filled 2020-05-24 (×21): qty 1

## 2020-05-24 MED ORDER — HYDRALAZINE HCL 50 MG PO TABS
100.0000 mg | ORAL_TABLET | Freq: Three times a day (TID) | ORAL | Status: DC
  Administered 2020-05-24 – 2020-06-09 (×49): 100 mg via ORAL
  Filled 2020-05-24 (×51): qty 2

## 2020-05-24 MED ORDER — ACETAMINOPHEN 500 MG PO TABS
500.0000 mg | ORAL_TABLET | Freq: Four times a day (QID) | ORAL | Status: DC | PRN
  Administered 2020-05-27 – 2020-06-08 (×13): 500 mg via ORAL
  Filled 2020-05-24 (×13): qty 1

## 2020-05-24 MED ORDER — CARVEDILOL 6.25 MG PO TABS
6.2500 mg | ORAL_TABLET | Freq: Two times a day (BID) | ORAL | Status: DC
  Administered 2020-05-24 – 2020-06-09 (×33): 6.25 mg via ORAL
  Filled 2020-05-24 (×27): qty 1
  Filled 2020-05-24: qty 2
  Filled 2020-05-24 (×5): qty 1

## 2020-05-24 NOTE — ED Notes (Signed)
IV team at bedside 

## 2020-05-24 NOTE — ED Triage Notes (Signed)
Pt arrived via EMS from [facility]. Pt reports vaginal and rectal bleeding for the past few days worsening today.

## 2020-05-24 NOTE — ED Provider Notes (Signed)
[facility] EMERGENCY DEPARTMENT Provider Note   CSN: [id number] Arrival date & time: 05/24/20  1232     History Chief Complaint  Patient presents with  . Vaginal Bleeding  . Rectal Bleeding    Vanessa [name] is a 64 y.o. female presenting for evaluation of vaginal bleeding.  Patient states she has vaginal bleeding, but is been worse in the past couple days. Her facility was concerned about her blood counts, and sent her to the ER. She denies dizziness, weakness, lightheadedness. She denies fevers, chills, chest pain, shortness of breath, nausea, vomiting abdominal pain, urinary symptoms, normal bowel movements. She is not on blood thinners. She is taking iron pills. There is some confusion as to whether patient is having vaginal ans/or rectal bleeding, however on my evaluation patient states it is only vaginal.  Additional history obtained from chart review. Patient with a history of stage III endometrial cancer being followed by Gyn Onc. She does have an appointment tomorrow. Additional history of chronic anemia, CKD, diabetes, hypertension, HLD, obesity. She is currently at a rehab facility due to leg swelling and recent cellulitis of bilateral lower extremities.  HPI     Past Medical History:  Diagnosis Date  . Anemia 10/09/2019  . Asthma   . Cellulitis and abscess of lower extremity 04/22/2020  . CKD (chronic kidney disease) stage 3, GFR 30-59 ml/min (HCC)   . Dental disease   . Diabetes mellitus without complication ([place])   . Endometrial cancer ([place])   . Esophageal dysmotility   . Heart murmur   . HLD (hyperlipidemia)   . Hypertension   . MSSA bacteremia 09/2019  . Obesity, Class III, BMI 40-49.9 (morbid obesity) ([place])   . Pressure ulcer     Patient Active Problem List   Diagnosis Date Noted  . Cellulitis 04/23/2020  . Bilateral lower leg cellulitis 04/22/2020  . Neurocognitive deficits 12/11/2019  . Edema 12/04/2019  . AKI (acute kidney  injury) ([place]) 12/03/2019  . Hypothermia 11/26/2019  . CKD (chronic kidney disease) stage 3, GFR 30-59 ml/min (HCC)   . Hypernatremia   . COVID-19 virus infection   . Generalized weakness   . Hypertensive urgency   . Allergic angioedema 10/19/2019  . Anasarca 10/18/2019  . Angioedema 10/18/2019  . Hypertensive emergency 10/18/2019  . Trichimoniasis 10/17/2019  . Macrocytic anemia 10/05/2019  . Esophageal dysmotility 10/04/2019  . Pressure injury of skin 09/22/2019  . Endometrial cancer ([place]) 09/21/2019  . Left humeral fracture 09/21/2019  . Iron deficiency anemia due to chronic blood loss 09/21/2019  . MSSA bacteremia 09/18/2019  . Postmenopausal bleeding 09/15/2019  . Thrombocytopenia ([place]) 09/15/2019  . Generalized pruritus 09/15/2019  . Benign hypertension with CKD (chronic kidney disease) stage III ([place]) 09/15/2019  . Hyperuricemia 09/15/2019  . Hypokalemia 09/15/2019  . Symptomatic anemia 09/14/2019  . Non compliance w medication regimen 03/24/2015  . Dyslipidemia 03/24/2015  . DM type 2 (diabetes mellitus, type 2) ([place]) 05/23/2014  . HTN (hypertension) 05/23/2014    Past Surgical History:  Procedure Laterality Date  . BIOPSY  10/08/2019   Procedure: BIOPSY;  Surgeon: Clarene Essex, MD;  Location: [facility];  Service: Endoscopy;;  . ESOPHAGOGASTRODUODENOSCOPY N/A 10/08/2019   Procedure: ESOPHAGOGASTRODUODENOSCOPY (EGD);  Surgeon: Clarene Essex, MD;  Location: [facility];  Service: Endoscopy;  Laterality: N/A;  . TYMPANOSTOMY TUBE PLACEMENT Bilateral      OB History    Gravida  5   Para  5   Term  4  Preterm  1   AB      Living  5     SAB      TAB      Ectopic      Multiple      Live Births  5           Family History  Problem Relation Age of Onset  . Stroke Mother   . Diabetes Mother   . Hypertension Mother   . Cancer Mother        possible ovarian  . Diabetes Brother   . Hypertension Brother   . Cancer Maternal Grandmother         possible ovarian    Social History   Tobacco Use  . Smoking status: Former Research scientist (life sciences)  . Smokeless tobacco: Never Used  . Tobacco comment: " years ago ", >61yrs  Vaping Use  . Vaping Use: Never used  Substance Use Topics  . Alcohol use: Yes    Comment: occasional  . Drug use: No    Home Medications Prior to Admission medications   Medication Sig Start Date End Date Taking? Authorizing Provider  acetaminophen (TYLENOL) 500 MG tablet Take 500 mg by mouth every 6 (six) hours as needed for moderate pain.    [provider]  Amino Acids-Protein Hydrolys (FEEDING SUPPLEMENT, PRO-STAT SUGAR FREE 64,) LIQD Take 30 mLs by mouth 3 (three) times daily with meals. Patient not taking: Reported on 02/15/2020 11/21/19   Jonetta Osgood, MD  amLODipine (NORVASC) 10 MG tablet Take 1 tablet (10 mg total) by mouth daily. 04/29/20   Swayze, Ava, DO  aspirin 81 MG chewable tablet Chew 81 mg by mouth daily.    [provider]  carvedilol (COREG) 6.25 MG tablet Take 1 tablet (6.25 mg total) by mouth 2 (two) times daily with a meal. 12/13/19   Medina-Vargas, Monina C, NP  cloNIDine (CATAPRES) 0.2 MG tablet Take 1 tablet (0.2 mg total) by mouth 2 (two) times daily. 12/13/19   Medina-Vargas, Monina C, NP  diphenhydrAMINE (BENADRYL) 25 MG tablet Take 25 mg by mouth 3 (three) times daily as needed for itching.    [provider]  feeding supplement, ENSURE ENLIVE, (ENSURE ENLIVE) LIQD Take 237 mLs by mouth 3 (three) times daily between meals. Patient not taking: Reported on 02/15/2020 11/21/19   Jonetta Osgood, MD  ferrous sulfate 325 (65 FE) MG tablet Take 1 tablet (325 mg total) by mouth 2 (two) times daily with a meal. 12/13/19   Medina-Vargas, Monina C, NP  hydrALAZINE (APRESOLINE) 100 MG tablet Take 1 tablet (100 mg total) by mouth every 8 (eight) hours. 04/30/20   Antonieta Pert, MD  hydrocerin (EUCERIN) CREA Apply 1 application topically daily. 04/29/20   Swayze, Ava, DO  hydrocortisone  (PROCTOZONE-HC) 2.5 % rectal cream Place 1 application rectally 2 (two) times daily as needed for hemorrhoids or anal itching.    [provider]  insulin aspart protamine- aspart (NOVOLOG MIX 70/30) (70-30) 100 UNIT/ML injection Inject into the skin.    [provider]  loratadine (CLARITIN) 10 MG tablet Take 1 tablet (10 mg total) by mouth daily as needed for allergies. Patient not taking: Reported on 04/22/2020 12/13/19   Medina-Vargas, Monina C, NP  Multiple Vitamin (MULTIVITAMIN WITH MINERALS) TABS tablet Take 1 tablet by mouth daily. 04/29/20   Swayze, Ava, DO  nutrition supplement, JUVEN, (JUVEN) PACK Take 1 packet by mouth 2 (two) times daily between meals. 04/29/20   Swayze, Ava, DO  polyethylene glycol (MIRALAX / GLYCOLAX) 17 g packet Take 17 g by mouth daily. Patient not taking: Reported on 02/15/2020 09/25/19   Arrien, Jimmy Picket, MD  sodium bicarbonate 650 MG tablet Take 2 tablets (1,300 mg total) by mouth 2 (two) times daily. 04/30/20   Antonieta Pert, MD    Allergies    Ace inhibitors and Angiotensin receptor blockers  Review of Systems   Review of Systems  Genitourinary: Positive for vaginal bleeding (worsening).  All other systems reviewed and are negative.   Physical Exam Updated Vital Signs BP (!) 164/73   Pulse 61   Temp 97.9 F (36.6 C) (Oral)   Resp 14   Ht 4\' 11"  (1.499 m)   Wt 117.9 kg   LMP 03/17/2015   SpO2 96%   BMI 52.51 kg/m   Physical Exam Vitals and nursing note reviewed. Exam conducted with a chaperone present.  Constitutional:      General: She is not in acute distress.    Appearance: She is well-developed. She is obese.     Comments: Appears nontoxic  HENT:     Head: Normocephalic and atraumatic.  Eyes:     Extraocular Movements: Extraocular movements intact.     Conjunctiva/sclera: Conjunctivae normal.     Pupils: Pupils are equal, round, and reactive to light.  Cardiovascular:     Rate and Rhythm: Normal rate and regular  rhythm.     Pulses: Normal pulses.  Pulmonary:     Effort: Pulmonary effort is normal. No respiratory distress.     Breath sounds: Normal breath sounds. No wheezing.  Abdominal:     General: There is no distension.     Palpations: Abdomen is soft. There is no mass.     Tenderness: There is no abdominal tenderness. There is no guarding or rebound.     Comments: No ttp  Genitourinary:    Comments: No gross rectal bleeding. Vaginal bleeding can be seen externally. Musculoskeletal:        General: Normal range of motion.     Cervical back: Normal range of motion and neck supple.  Skin:    General: Skin is warm and dry.     Capillary Refill: Capillary refill takes less than 2 seconds.  Neurological:     Mental Status: She is alert and oriented to person, place, and time.     ED Results / Procedures / Treatments   Labs (all labs ordered are listed, but only abnormal results are displayed) Labs Reviewed  CBC - Abnormal; Notable for the following components:      Result Value   RBC 2.14 (*)    Hemoglobin 6.0 (*)    HCT 20.7 (*)    MCHC 29.0 (*)    RDW 18.2 (*)    All other components within normal limits  BASIC METABOLIC PANEL - Abnormal; Notable for the following components:   Glucose, Bld 113 (*)    BUN 38 (*)    Creatinine, Ser 2.55 (*)    Calcium 8.0 (*)    GFR, Estimated 20 (*)    All other components within normal limits  RESPIRATORY PANEL BY RT PCR (FLU A&B, COVID)  TYPE AND SCREEN  PREPARE RBC (CROSSMATCH)    EKG None  Radiology No results found.  Procedures .Critical Care Performed by: Franchot Heidelberg, PA-C Authorized by: Franchot Heidelberg, PA-C   Critical care provider statement:    Critical care time (minutes):  30   Critical care time was exclusive of:  Separately billable procedures and treating other patients and teaching time   Critical care was necessary to treat or prevent imminent or life-threatening deterioration of the following conditions:   CNS failure or compromise   Critical care was time spent personally by me on the following activities:  Blood draw for specimens, development of treatment plan with patient or surrogate, discussions with consultants, examination of patient, obtaining history from patient or surrogate, ordering and review of laboratory studies, ordering and performing treatments and interventions, ordering and review of radiographic studies, pulse oximetry, re-evaluation of patient's condition and review of old charts   I assumed direction of critical care for this patient from another provider in my specialty: no   Comments:     Patient with critically low hemoglobin of 6. Will need transfusion and admission.   (including critical care time)  Medications Ordered in ED Medications  0.9 %  sodium chloride infusion (Manually program via Guardrails IV Fluids) (has no administration in time range)    ED Course  I have reviewed the triage vital signs and the nursing notes.  Pertinent labs & imaging results that were available during my care of the patient were reviewed by me and considered in my medical decision making (see chart for details).    MDM Rules/Calculators/A&P                          Patient presenting for evaluation of heavier than normal vaginal bleeding. On exam, patient peers nontoxic. She is hemodynamically stable. She does have known endometrial cancer, this is likely the cause. There was some confusion about rectal bleeding, however on my evaluation, patient does not have rectal bleeding. She is not on blood thinners. Will check hemoglobin. If at baseline, plan for discharge with outpatient follow-up with Gyn Onc as scheduled.  Hemoglobin critically low at 6. This is an acute change to her baseline. She will need transfusion and admission to the hospital. Will consult with gynecology oncology.  Discussed with Dr. Denman George from gynecology oncology who agrees with blood transfusion. Request  medicine admit, they will consult. Patient is not a surgical candidate at this time.  Discussed with Dr. Jamse Arn from [facility], patient to be admitted.  Final Clinical Impression(s) / ED Diagnoses Final diagnoses:  Vaginal bleeding  Anemia, unspecified type    Rx / DC Orders ED Discharge Orders    None       Franchot Heidelberg, PA-C 05/24/20 1446    Isla Pence, MD 05/25/20 [phone]

## 2020-05-24 NOTE — ED Notes (Signed)
Pt difficult stick, awaiting IVT consult to insert IV. Will continue to monitor.

## 2020-05-24 NOTE — H&P (Signed)
History and Physical:    Vanessa [name]   LEX:[id number] DOB: 11-07-1955 DOA: 05/24/2020  Referring MD/provider: PA [place] PCP: Pcp, No   Patient coming from: Home  Chief Complaint: Increased vaginal bleeding  History of Present Illness:   Vanessa [name] is an 64 y.o. female with PMH significant for recent diagnosis of endometrial CA, HTN, CKD 3, diet-controlled diabetes and recent admission for lower extremity cellulitis who was sent over from her rehab facility for significant increase in her vaginal bleeding.  Patient apparently has chronic vaginal bleeding however over the past couple of days apparently she has been discharging a lot of clots and much more bleeding than usual.  Patient herself is unaware of this as she spends most of her day in bed and wears a diaper.  She was alarmed however when she saw how much blood there was in her diaper earlier today.  She agreed with staff at the rehab facility that she needed to come to the ED.  Patient admits to feeling somewhat weak.  She notes "I do not feel good but I do not feel as bad as I could".  She denies any chest pain.  She does admit to having some shortness of breath earlier today however that improved with sitting up.  She denies general orthopnea or PND usually.  Patient admits she does not exert herself very much.  Denies any dizziness while lying in bed.  Patient specifically denies history of congestive heart failure or pulmonary edema.  Denies known history of CAD or MI.  Echocardiogram done March 2021 shows EF of 50 to 60% with grade 2 diastolic dysfunction.  She does have moderately elevated PA pressures with peak pressure of 57.  ED Course:  The patient was noted to be afebrile and normotensive.  Laboratory work-up was notable for hemoglobin of 6, down from 8.1 on 04/30/2020.  She has known CKD and her creatinine is at baseline at 2.5.  Patient was discussed with GYN in the ED, they recommended admission to  hospitalist with comanagement by them.  They will manage her vaginal bleeding.  Patient was started on transfusion 2 unit PRBC.   ROS:   ROS   Review of Systems: Cardiovascular: Denies chest pain or palpitations GI: Denies nausea, vomiting, diarrhea or constipation GU: Denies dysuria, frequency or hematuria   Past Medical History:   Past Medical History:  Diagnosis Date  . Anemia 10/09/2019  . Asthma   . Cellulitis and abscess of lower extremity 04/22/2020  . CKD (chronic kidney disease) stage 3, GFR 30-59 ml/min (HCC)   . Dental disease   . Diabetes mellitus without complication ([place])   . Endometrial cancer ([place])   . Esophageal dysmotility   . Heart murmur   . HLD (hyperlipidemia)   . Hypertension   . MSSA bacteremia 09/2019  . Obesity, Class III, BMI 40-49.9 (morbid obesity) ([place])   . Pressure ulcer     Past Surgical History:   Past Surgical History:  Procedure Laterality Date  . BIOPSY  10/08/2019   Procedure: BIOPSY;  Surgeon: Clarene Essex, MD;  Location: [facility] ENDOSCOPY;  Service: Endoscopy;;  . ESOPHAGOGASTRODUODENOSCOPY N/A 10/08/2019   Procedure: ESOPHAGOGASTRODUODENOSCOPY (EGD);  Surgeon: Clarene Essex, MD;  Location: [place];  Service: Endoscopy;  Laterality: N/A;  . TYMPANOSTOMY TUBE PLACEMENT Bilateral     Social History:   Social History   Socioeconomic History  . Marital status: Married    Spouse name: Not on file  .  Number of children: Not on file  . Years of education: Not on file  . Highest education level: Not on file  Occupational History  . Not on file  Tobacco Use  . Smoking status: Former Research scientist (life sciences)  . Smokeless tobacco: Never Used  . Tobacco comment: " years ago ", >32yrs  Vaping Use  . Vaping Use: Never used  Substance and Sexual Activity  . Alcohol use: Yes    Comment: occasional  . Drug use: No  . Sexual activity: Not Currently  Other Topics Concern  . Not on file  Social History Narrative  . Not on file   Social Determinants  of Health   Financial Resource Strain:   . Difficulty of Paying Living Expenses: Not on file  Food Insecurity:   . Worried About Charity fundraiser in the Last Year: Not on file  . Ran Out of Food in the Last Year: Not on file  Transportation Needs:   . Lack of Transportation (Medical): Not on file  . Lack of Transportation (Non-Medical): Not on file  Physical Activity:   . Days of Exercise per Week: Not on file  . Minutes of Exercise per Session: Not on file  Stress:   . Feeling of Stress : Not on file  Social Connections:   . Frequency of Communication with Friends and Family: Not on file  . Frequency of Social Gatherings with Friends and Family: Not on file  . Attends Religious Services: Not on file  . Active Member of Clubs or Organizations: Not on file  . Attends Archivist Meetings: Not on file  . Marital Status: Not on file  Intimate Partner Violence:   . Fear of Current or Ex-Partner: Not on file  . Emotionally Abused: Not on file  . Physically Abused: Not on file  . Sexually Abused: Not on file    Allergies   Ace inhibitors and Angiotensin receptor blockers  Family history:   Family History  Problem Relation Age of Onset  . Stroke Mother   . Diabetes Mother   . Hypertension Mother   . Cancer Mother        possible ovarian  . Diabetes Brother   . Hypertension Brother   . Cancer Maternal Grandmother        possible ovarian    Current Medications:   Prior to Admission medications   Medication Sig Start Date End Date Taking? Authorizing Provider  acetaminophen (TYLENOL) 500 MG tablet Take 500 mg by mouth every 6 (six) hours as needed (pain).    Yes [provider]  amLODipine (NORVASC) 10 MG tablet Take 1 tablet (10 mg total) by mouth daily. 04/29/20  Yes Swayze, Ava, DO  aspirin 81 MG chewable tablet Chew 81 mg by mouth daily.   Yes [provider]  carvedilol (COREG) 6.25 MG tablet Take 1 tablet (6.25 mg total) by mouth 2  (two) times daily with a meal. 12/13/19  Yes Medina-Vargas, Monina C, NP  cloNIDine (CATAPRES) 0.2 MG tablet Take 1 tablet (0.2 mg total) by mouth 2 (two) times daily. Patient taking differently: Take 0.2 mg by mouth 2 (two) times daily with a meal.  12/13/19  Yes Medina-Vargas, Monina C, NP  diphenhydrAMINE (BENADRYL) 25 MG tablet Take 25 mg by mouth See admin instructions. Take one tablet (25 mg) by mouth three times daily, may also take one tablet (25 mg) every 8 hours as needed for itching for 14 days - 05/15/2020 thru 05/29/2020  Yes [provider]  ferrous sulfate 325 (65 FE) MG tablet Take 1 tablet (325 mg total) by mouth 2 (two) times daily with a meal. 12/13/19  Yes Medina-Vargas, Monina C, NP  furosemide (LASIX) 20 MG tablet Take 60 mg by mouth 2 (two) times daily. 9am, 6pm   Yes [provider]  hydrALAZINE (APRESOLINE) 100 MG tablet Take 1 tablet (100 mg total) by mouth every 8 (eight) hours. 04/30/20  Yes Antonieta Pert, MD  hydrocerin (EUCERIN) CREA Apply 1 application topically daily. 04/29/20  Yes Swayze, Ava, DO  hydrocortisone (ANUSOL-HC) 25 MG suppository Place 25 mg rectally daily.   Yes [provider]  hydrocortisone (PROCTOZONE-HC) 2.5 % rectal cream Place 1 application rectally every 12 (twelve) hours as needed for hemorrhoids.    Yes [provider]  levofloxacin (LEVAQUIN) 750 MG tablet Take 750 mg by mouth daily at 6 PM.   Yes [provider]  loratadine (CLARITIN) 10 MG tablet Take 1 tablet (10 mg total) by mouth daily as needed for allergies. 12/13/19  Yes Medina-Vargas, Monina C, NP  metolazone (ZAROXOLYN) 5 MG tablet Take 5 mg by mouth daily.   Yes [provider]  Multiple Vitamin (MULTIVITAMIN WITH MINERALS) TABS tablet Take 1 tablet by mouth daily. 04/29/20  Yes Swayze, Ava, DO  nutrition supplement, JUVEN, (JUVEN) PACK Take 1 packet by mouth 2 (two) times daily between meals. 04/29/20  Yes Swayze, Ava, DO  polyethylene  glycol (MIRALAX / GLYCOLAX) 17 g packet Take 17 g by mouth daily. 09/25/19  Yes Arrien, Jimmy Picket, MD  sodium bicarbonate 650 MG tablet Take 2 tablets (1,300 mg total) by mouth 2 (two) times daily. Patient taking differently: Take 1,300 mg by mouth 2 (two) times daily with a meal.  04/30/20  Yes Kc, Ramesh, MD  Amino Acids-Protein Hydrolys (FEEDING SUPPLEMENT, PRO-STAT SUGAR FREE 64,) LIQD Take 30 mLs by mouth 3 (three) times daily with meals. Patient not taking: Reported on 02/15/2020 11/21/19   Jonetta Osgood, MD    Physical Exam:   Vitals:   05/24/20 1245 05/24/20 1300 05/24/20 1400 05/24/20 1500  BP:  (!) 159/75 (!) 164/73 (!) 165/74  Pulse:  63 61 62  Resp:  15 14 14   Temp:      TempSrc:      SpO2:  97% 96% 96%  Weight: 117.9 kg     Height: 4\' 11"  (1.499 m)        Physical Exam: Blood pressure (!) 165/74, pulse 62, temperature 97.9 F (36.6 C), temperature source Oral, resp. rate 14, height 4\' 11"  (1.499 m), weight 117.9 kg, last menstrual period 03/17/2015, SpO2 96 %. Gen: Pale tired appearing female lying in bed at 20 degrees in NAD. Eyes: sclera anicteric, conjuctiva are pale CVS: S1-S2, regulary, no gallops Respiratory:  decreased air entry likely secondary to decreased inspiratory effort GI: NABS, soft, NT, obese, no guarding. GYN exam is deferred to GYN service LE: Massive brawny edema with areas of nodularity and hyperpigmentation along anterior shin bilaterally. Neuro: grossly nonfocal.  Psych: patient is logical and coherent, judgement and insight appear normal, mood and affect appropriate to situation.   Data Review:    Labs: Basic Metabolic Panel: Recent Labs  Lab 05/24/20 1340  NA 145  K 3.5  CL 110  CO2 25  GLUCOSE 113*  BUN 38*  CREATININE 2.55*  CALCIUM 8.0*   Liver Function Tests: No results for input(s): AST, ALT, ALKPHOS, BILITOT, PROT, ALBUMIN in the last 168  hours. No results for input(s): LIPASE, AMYLASE in the last 168 hours. No  results for input(s): AMMONIA in the last 168 hours. CBC: Recent Labs  Lab 05/24/20 1340  WBC 5.2  HGB 6.0*  HCT 20.7*  MCV 96.7  PLT 183   Cardiac Enzymes: No results for input(s): CKTOTAL, CKMB, CKMBINDEX, TROPONINI in the last 168 hours.  BNP (last 3 results) No results for input(s): PROBNP in the last 8760 hours. CBG: No results for input(s): GLUCAP in the last 168 hours.  Urinalysis    Component Value Date/Time   COLORURINE AMBER (A) 04/29/2020 1648   APPEARANCEUR HAZY (A) 04/29/2020 1648   LABSPEC 1.017 04/29/2020 1648   PHURINE 5.0 04/29/2020 1648   GLUCOSEU NEGATIVE 04/29/2020 1648   HGBUR LARGE (A) 04/29/2020 1648   BILIRUBINUR NEGATIVE 04/29/2020 1648   KETONESUR NEGATIVE 04/29/2020 1648   PROTEINUR 100 (A) 04/29/2020 1648   UROBILINOGEN 0.2 03/06/2014 1644   NITRITE NEGATIVE 04/29/2020 1648   LEUKOCYTESUR TRACE (A) 04/29/2020 1648      Radiographic Studies: No results found.  EKG: Independently reviewed.  Narrow complex bradycardia at 60.  It is read as sinus by the EKG machine however I am finding it difficult to identify P waves except perhaps in lead V6.  Markedly flattened P waves and T waves.  Low voltage.  May well be secondary to body habitus.  QTC is read as 507 however given lack of T waves it would be hard to read.  Compared to previous EKG from last month, it is without change.  Assessment/Plan:   Principal Problem:   Abnormal uterine bleeding due to primary malignant neoplasm of endometrium (HCC) Active Problems:   DM type 2 (diabetes mellitus, type 2) (HCC)   Dyslipidemia   Symptomatic anemia   Benign hypertension with CKD (chronic kidney disease) stage III (HCC)   Endometrial cancer (HCC)   Iron deficiency anemia due to chronic blood loss   Edema  Vaginal bleeding secondary to endometrial CA GYN service to address treatment of increased vaginal bleeding Will transfuse 2 unit PRBC Hold aspirin Continue iron  Hemorrhoids We will  continue HC suppositories There is some concern that hemorrhoids may be contributing to bleeding We will need to follow this closely, will ask nurses to assess when they change her.  Abnormal EKG Regular narrow complex bradycardia read as sinus by EKG machine  P wave and T waves are diffusely flattened so is difficult for me to confirm that QTC is elevated at 507, will hold Levaquin This is without any change from previous EKG from last month.  Levaquin She is on a 7-day course of Levaquin started on 05/22/2020, unclear indication Given elevated QTC, will hold Levaquin Have asked pharmacy tech to look into indication for Levaquin, can substitute another drug once we know what it is for.  HTN Patient is actually hypertensive despite ongoing vaginal bleeding We will continue amlodipine 10, clonidine 0.2 mg twice daily, carvedilol and hydralazine per home doses We will also continue Lasix and metolazone given patient is to receive 2 units of blood and she has significant lower extremity edema  DM2 Patient does not appear to be on any antidiabetic medicine Will place patient on carb controlled diet and check fingersticks  SSI not ordered    Other information:   DVT prophylaxis: Foot pump ordered, patient is bleeding so no pharmacological DVT prophylaxis and patient has massive lower extremity edema so SCDs not ordered, TED hose would not fit her. Code Status:  Full Family Communication: Patient states her husband has been called Disposition Plan: Probably back to the rehab facility from where she came Consults called: Comanaged by GYN Admission status: Observation  Travares Nelles Tublu Dalena Plantz [facility]  If 7PM-7AM, please contact night-coverage [email or web address] Password [facility] 05/24/2020, 3:29 PM

## 2020-05-25 ENCOUNTER — Encounter (HOSPITAL_COMMUNITY): Payer: Self-pay | Admitting: Internal Medicine

## 2020-05-25 ENCOUNTER — Ambulatory Visit
Admit: 2020-05-25 | Discharge: 2020-05-25 | Disposition: A | Discharge status: Home / Self Care | Payer: Medicare Other | Attending: Radiation Oncology | Admitting: Radiation Oncology

## 2020-05-25 ENCOUNTER — Telehealth: Payer: Self-pay | Admitting: Oncology

## 2020-05-25 ENCOUNTER — Inpatient Hospital Stay: Payer: MEDICAID | Admitting: Gynecologic Oncology

## 2020-05-25 ENCOUNTER — Inpatient Hospital Stay: Payer: MEDICAID

## 2020-05-25 ENCOUNTER — Inpatient Hospital Stay: Payer: MEDICAID | Admitting: Genetic Counselor

## 2020-05-25 DIAGNOSIS — Z20822 Contact with and (suspected) exposure to covid-19: Secondary | ICD-10-CM | POA: Diagnosis present

## 2020-05-25 DIAGNOSIS — R269 Unspecified abnormalities of gait and mobility: Secondary | ICD-10-CM | POA: Diagnosis present

## 2020-05-25 DIAGNOSIS — K649 Unspecified hemorrhoids: Secondary | ICD-10-CM | POA: Diagnosis present

## 2020-05-25 DIAGNOSIS — D631 Anemia in chronic kidney disease: Secondary | ICD-10-CM | POA: Diagnosis present

## 2020-05-25 DIAGNOSIS — Z6841 Body Mass Index (BMI) 40.0 and over, adult: Secondary | ICD-10-CM | POA: Diagnosis not present

## 2020-05-25 DIAGNOSIS — N184 Chronic kidney disease, stage 4 (severe): Secondary | ICD-10-CM | POA: Diagnosis present

## 2020-05-25 DIAGNOSIS — L89152 Pressure ulcer of sacral region, stage 2: Secondary | ICD-10-CM | POA: Diagnosis present

## 2020-05-25 DIAGNOSIS — D62 Acute posthemorrhagic anemia: Secondary | ICD-10-CM | POA: Diagnosis present

## 2020-05-25 DIAGNOSIS — D649 Anemia, unspecified: Secondary | ICD-10-CM

## 2020-05-25 DIAGNOSIS — D500 Iron deficiency anemia secondary to blood loss (chronic): Secondary | ICD-10-CM

## 2020-05-25 DIAGNOSIS — E1122 Type 2 diabetes mellitus with diabetic chronic kidney disease: Secondary | ICD-10-CM

## 2020-05-25 DIAGNOSIS — R609 Edema, unspecified: Secondary | ICD-10-CM | POA: Diagnosis not present

## 2020-05-25 DIAGNOSIS — Z833 Family history of diabetes mellitus: Secondary | ICD-10-CM | POA: Diagnosis not present

## 2020-05-25 DIAGNOSIS — C541 Malignant neoplasm of endometrium: Secondary | ICD-10-CM | POA: Diagnosis present

## 2020-05-25 DIAGNOSIS — E876 Hypokalemia: Secondary | ICD-10-CM | POA: Diagnosis not present

## 2020-05-25 DIAGNOSIS — Z888 Allergy status to other drugs, medicaments and biological substances status: Secondary | ICD-10-CM | POA: Diagnosis not present

## 2020-05-25 DIAGNOSIS — Z9119 Patient's noncompliance with other medical treatment and regimen: Secondary | ICD-10-CM | POA: Diagnosis not present

## 2020-05-25 DIAGNOSIS — R001 Bradycardia, unspecified: Secondary | ICD-10-CM | POA: Diagnosis present

## 2020-05-25 DIAGNOSIS — Z8614 Personal history of Methicillin resistant Staphylococcus aureus infection: Secondary | ICD-10-CM | POA: Diagnosis not present

## 2020-05-25 DIAGNOSIS — Z8616 Personal history of COVID-19: Secondary | ICD-10-CM | POA: Diagnosis not present

## 2020-05-25 DIAGNOSIS — I129 Hypertensive chronic kidney disease with stage 1 through stage 4 chronic kidney disease, or unspecified chronic kidney disease: Secondary | ICD-10-CM

## 2020-05-25 DIAGNOSIS — E785 Hyperlipidemia, unspecified: Secondary | ICD-10-CM | POA: Diagnosis not present

## 2020-05-25 DIAGNOSIS — Z823 Family history of stroke: Secondary | ICD-10-CM | POA: Diagnosis not present

## 2020-05-25 DIAGNOSIS — L89892 Pressure ulcer of other site, stage 2: Secondary | ICD-10-CM | POA: Diagnosis not present

## 2020-05-25 DIAGNOSIS — N939 Abnormal uterine and vaginal bleeding, unspecified: Secondary | ICD-10-CM | POA: Diagnosis present

## 2020-05-25 DIAGNOSIS — D72819 Decreased white blood cell count, unspecified: Secondary | ICD-10-CM | POA: Diagnosis not present

## 2020-05-25 DIAGNOSIS — N1832 Chronic kidney disease, stage 3b: Secondary | ICD-10-CM

## 2020-05-25 DIAGNOSIS — N183 Chronic kidney disease, stage 3 unspecified: Secondary | ICD-10-CM

## 2020-05-25 DIAGNOSIS — D630 Anemia in neoplastic disease: Secondary | ICD-10-CM | POA: Diagnosis present

## 2020-05-25 LAB — GLUCOSE, CAPILLARY
Glucose-Capillary: 109 mg/dL — ABNORMAL HIGH (ref 70–99)
Glucose-Capillary: 134 mg/dL — ABNORMAL HIGH (ref 70–99)

## 2020-05-25 LAB — CBC
HCT: 25.2 % — ABNORMAL LOW (ref 36.0–46.0)
Hemoglobin: 7.9 g/dL — ABNORMAL LOW (ref 12.0–15.0)
MCH: 29.7 pg (ref 26.0–34.0)
MCHC: 31.3 g/dL (ref 30.0–36.0)
MCV: 94.7 fL (ref 80.0–100.0)
Platelets: 165 10*3/uL (ref 150–400)
RBC: 2.66 MIL/uL — ABNORMAL LOW (ref 3.87–5.11)
RDW: 16.5 % — ABNORMAL HIGH (ref 11.5–15.5)
WBC: 4.6 10*3/uL (ref 4.0–10.5)
nRBC: 0.4 % — ABNORMAL HIGH (ref 0.0–0.2)

## 2020-05-25 LAB — BASIC METABOLIC PANEL
Anion gap: 9 (ref 5–15)
BUN: 40 mg/dL — ABNORMAL HIGH (ref 8–23)
CO2: 27 mmol/L (ref 22–32)
Calcium: 8 mg/dL — ABNORMAL LOW (ref 8.9–10.3)
Chloride: 109 mmol/L (ref 98–111)
Creatinine, Ser: 2.58 mg/dL — ABNORMAL HIGH (ref 0.44–1.00)
GFR, Estimated: 20 mL/min — ABNORMAL LOW (ref >60)
Glucose, Bld: 129 mg/dL — ABNORMAL HIGH (ref 70–99)
Potassium: 3.6 mmol/L (ref 3.5–5.1)
Sodium: 145 mmol/L (ref 135–145)

## 2020-05-25 MED ORDER — CHLORHEXIDINE GLUCONATE CLOTH 2 % EX PADS
6.0000 | MEDICATED_PAD | Freq: Every day | CUTANEOUS | Status: DC
  Administered 2020-05-25 – 2020-05-31 (×6): 6 via TOPICAL

## 2020-05-25 NOTE — Consult Note (Signed)
Consult Note: Gyn-Onc  Consult was requested by Dr. Ree Kida for the evaluation of Vanessa [name] 64 y.o. female  CC:  Chief Complaint  Patient presents with  . Vaginal Bleeding  . Rectal Bleeding    Assessment/Plan:  [name]  is a 64 y.o.  year old with grade 3, clinical stage I endometrial carcinoma, not a surgical candidate, symptomatic with acute blood loss anemia.  She has received blood transfusion with appropriate response.  She is not a candidate for hysterectomy, and has expelled her IUD. Given the grade 3 nature of her tumor, it is unlikely she will have much response to either a replaced IUD or oral progestins.  A treatment recommendation for radiation (definitive) had been made in August, 2021, however, the patient failed to show to 5 different appointments made with Dr Sondra Come due to transportation issues. Our goal of therapy has shifted from curative intent to palliative intent (control of bleeding). We have asked Dr Sondra Come from [facility] to see [name] and consider initiating a palliative course of pelvic RT during this hospitalization (with goal to complete as an outpatient with transportation assistance from rehab/SNF).    HPI: [name] is a 63 year old P3 who was seen in consultation at the request of Dr Ree Kida for evaluation of acute blood loss anemia from known grade 3 endometrial cancer.  The patient is an established patient of my partner, Dr Jeral Pinch.  She was diagnosed with grade 2 endometrial cancer in March, 2021. She was hospitalized for COVID-19 in April, 2021 and was then seen by Dr Berline Lopes in May, 2021 and an IUD (progestin releasing) was placed at that time because she was not felt to be a surgical candidate (for hysterectomy) due to her multiple complex comorbidities. In August, 2021 she experienced heavy vaginal bleeding and repeat sampling was performed which showed a grade 3 endometrial cancer. It was felt that hormonal therapy  was futile in the setting of this aggressive cell type and therefore a plan was made for radiation oncology referral and definitive radiation.   The patient failed to attend 5 scheduled appointments with Radiation Oncology due to problems securing transportation.   She was admitted to a nursing home in October/November, 0017 due to complications of her lower extremity wounds. While at the nursing home she began experiencing multiple bleeding episodes. She reported that her IUD was visualized by the nurses in her pad (expelled from the uterus). She continued to bleed heavily and the nursing home transferred her to [facility] for further evaluation.  When she was evaluated on 05/24/20 she had a Hb of 6mg /dL felt to be secondary to acute blood loss anemia from her uterine bleeding.  She received 2 units of PRBC with subsequent appropriate rise in Hb.   Current Meds:  Current Facility-Administered Medications:  .  acetaminophen (TYLENOL) tablet 500 mg, 500 mg, Oral, Q6H PRN, Bonnell Public Tublu, MD .  amLODipine (NORVASC) tablet 10 mg, 10 mg, Oral, Daily, Bonnell Public Tublu, MD, 10 mg at 05/25/20 1040 .  carvedilol (COREG) tablet 6.25 mg, 6.25 mg, Oral, BID WC, Bonnell Public Tublu, MD, 6.25 mg at 05/25/20 0837 .  Chlorhexidine Gluconate Cloth 2 % PADS 6 each, 6 each, Topical, Daily, Vashti Hey, MD, 6 each at 05/25/20 1045 .  cloNIDine (CATAPRES) tablet 0.2 mg, 0.2 mg, Oral, BID WC, Bonnell Public Tublu, MD, 0.2 mg at 05/25/20 0835 .  diphenhydrAMINE (BENADRYL) capsule 25 mg, 25 mg,  Oral, TID, Vashti Hey, MD, 25 mg at 05/25/20 1040 .  ferrous sulfate tablet 325 mg, 325 mg, Oral, BID WC, Bonnell Public Tublu, MD, 325 mg at 05/25/20 0835 .  furosemide (LASIX) tablet 60 mg, 60 mg, Oral, BID, Bonnell Public Tublu, MD, 60 mg at 05/25/20 0835 .  hydrALAZINE (APRESOLINE) tablet 100 mg, 100 mg, Oral, Q8H, Bonnell Public Tublu, MD,  100 mg at 05/25/20 0517 .  hydrocerin (EUCERIN) cream 1 application, 1 application, Topical, Daily, Vashti Hey, MD, 1 application at 40/81/44 1042 .  hydrocortisone (ANUSOL-HC) suppository 25 mg, 25 mg, Rectal, Daily, Bonnell Public Tublu, MD, 25 mg at 05/25/20 1037 .  loratadine (CLARITIN) tablet 10 mg, 10 mg, Oral, Daily PRN, Bonnell Public Tublu, MD .  metolazone (ZAROXOLYN) tablet 5 mg, 5 mg, Oral, Daily, Jamse Arn, Dewaine Oats Tublu, MD, 5 mg at 05/25/20 1040 .  nutrition supplement (JUVEN) (JUVEN) powder packet 1 packet, 1 packet, Oral, BID BM, Vashti Hey, MD, 1 packet at 05/25/20 1041 .  polyethylene glycol (MIRALAX / GLYCOLAX) packet 17 g, 17 g, Oral, Daily, Bonnell Public Tublu, MD, 17 g at 05/25/20 1041 .  sodium bicarbonate tablet 1,300 mg, 1,300 mg, Oral, BID WC, Bonnell Public Tublu, MD, 1,300 mg at 05/25/20 8185   Allergy:  Allergies  Allergen Reactions  . Ace Inhibitors Other (See Comments)    Never prescribed but she has had angioedema  . Angiotensin Receptor Blockers Other (See Comments)    ARB never Rxed but PMH  of angioedema in March 2021    Social Hx:   Social History   Socioeconomic History  . Marital status: Married    Spouse name: Not on file  . Number of children: Not on file  . Years of education: Not on file  . Highest education level: Not on file  Occupational History  . Not on file  Tobacco Use  . Smoking status: Former Research scientist (life sciences)  . Smokeless tobacco: Never Used  . Tobacco comment: " years ago ", >60yrs  Vaping Use  . Vaping Use: Never used  Substance and Sexual Activity  . Alcohol use: Yes    Comment: occasional  . Drug use: No  . Sexual activity: Not Currently  Other Topics Concern  . Not on file  Social History Narrative  . Not on file   Social Determinants of Health   Financial Resource Strain:   . Difficulty of Paying Living Expenses: Not on file  Food Insecurity:   . Worried About Paediatric nurse in the Last Year: Not on file  . Ran Out of Food in the Last Year: Not on file  Transportation Needs:   . Lack of Transportation (Medical): Not on file  . Lack of Transportation (Non-Medical): Not on file  Physical Activity:   . Days of Exercise per Week: Not on file  . Minutes of Exercise per Session: Not on file  Stress:   . Feeling of Stress : Not on file  Social Connections:   . Frequency of Communication with Friends and Family: Not on file  . Frequency of Social Gatherings with Friends and Family: Not on file  . Attends Religious Services: Not on file  . Active Member of Clubs or Organizations: Not on file  . Attends Archivist Meetings: Not on file  . Marital Status: Not on file  Intimate Partner Violence:   . Fear of Current or Ex-Partner: Not on file  . Emotionally Abused: Not on file  .  Physically Abused: Not on file  . Sexually Abused: Not on file    Past Surgical Hx:  Past Surgical History:  Procedure Laterality Date  . BIOPSY  10/08/2019   Procedure: BIOPSY;  Surgeon: Clarene Essex, MD;  Location: [facility] ENDOSCOPY;  Service: Endoscopy;;  . ESOPHAGOGASTRODUODENOSCOPY N/A 10/08/2019   Procedure: ESOPHAGOGASTRODUODENOSCOPY (EGD);  Surgeon: Clarene Essex, MD;  Location: [place];  Service: Endoscopy;  Laterality: N/A;  . TYMPANOSTOMY TUBE PLACEMENT Bilateral     Past Medical Hx:  Past Medical History:  Diagnosis Date  . Anemia 10/09/2019  . Asthma   . Cellulitis and abscess of lower extremity 04/22/2020  . CKD (chronic kidney disease) stage 3, GFR 30-59 ml/min (HCC)   . Dental disease   . Diabetes mellitus without complication ([place])   . Endometrial cancer ([place])   . Esophageal dysmotility   . Heart murmur   . HLD (hyperlipidemia)   . Hypertension   . MSSA bacteremia 09/2019  . Obesity, Class III, BMI 40-49.9 (morbid obesity) ([place])   . Pressure ulcer   . Vaginal bleeding 05/2020    Past Gynecological History:   Patient's last menstrual  period was 03/17/2015.  Family Hx:  Family History  Problem Relation Age of Onset  . Stroke Mother   . Diabetes Mother   . Hypertension Mother   . Cancer Mother        possible ovarian  . Diabetes Brother   . Hypertension Brother   . Cancer Maternal Grandmother        possible ovarian    Review of Systems:  Constitutional  Feels fatigued  ENT Very poor dentition Skin/Breast  No rash, sores, jaundice, itching, dryness Cardiovascular  No chest pain, shortness of breath, or edema  Pulmonary  No cough or wheeze Gastro Intestinal  No nausea, vomitting, or diarrhoea. No bright red blood per rectum, no abdominal pain, change in bowel movement, or constipation.  Genito Urinary  No frequency, urgency, dysuria, + bleeding Musculo Skeletal  + lower extremity wounds and cannot ambulate Neurologic  No weakness, numbness, change in gait,  Psychology  No depression, anxiety, insomnia.   Vitals:  Blood pressure (!) 153/70, pulse 63, temperature 97.9 F (36.6 C), temperature source Oral, resp. rate 16, height 4\' 11"  (1.499 m), weight 260 lb (117.9 kg), last menstrual period 03/17/2015, SpO2 95 %.  Physical Exam: Poor dentition Neck  Supple NROM, without any enlargements.  Lymph Node Survey No cervical supraclavicular or inguinal adenopathy Cardiovascular  Warm skin indicating fair perfusion in peripheries Lungs  Mild increased WOB with speech Skin  Unable to assess feet as bound in foot protectors - bandages Psychiatry  Alert and oriented to person, place, and time  Abdomen  Normoactive bowel sounds, abdomen soft, non-tender and obese without evidence of hernia.  Genito Urinary  deferred Rectal  deferred Extremities  Foot protectors and bandages on feet  60 minutes of total time was spent for this patient encounter, including preparation, face-to-face counseling with the patient and coordination of care, review of imaging (results and images), communication with the  referring provider and documentation of the encounter.   Thereasa Solo, MD  05/25/2020, 2:27 PM  Cell [phone]

## 2020-05-25 NOTE — NC FL2 (Signed)
[place] LEVEL OF CARE SCREENING TOOL     IDENTIFICATION  Patient Name: Vanessa [name] Birthdate: 1956/03/12 Sex: female Admission Date (Current Location): 05/24/2020  [facility] and [place] Number:  Herbalist and Address:  [place]. [facility], [place] [address], [place], [place] [address]      Provider Number: [id number]  Attending Physician Name and Address:  Cristal Ford, DO  Relative Name and Phone Number:  Trystin, Hargrove (Spouse) [phone] [facility])    Current Level of Care: Hospital Recommended Level of Care: [place] Prior Approval Number: [id number]  Date Approved/Denied:   PASRR Number: [id number]  Discharge Plan: SNF    Current Diagnoses: Patient Active Problem List   Diagnosis Date Noted  . Abnormal uterine bleeding due to primary malignant neoplasm of endometrium ([place]) 05/24/2020  . Cellulitis 04/23/2020  . Bilateral lower leg cellulitis 04/22/2020  . Neurocognitive deficits 12/11/2019  . Edema 12/04/2019  . AKI (acute kidney injury) ([place]) 12/03/2019  . Hypothermia 11/26/2019  . CKD (chronic kidney disease) stage 3, GFR 30-59 ml/min (HCC)   . Hypernatremia   . COVID-19 virus infection   . Generalized weakness   . Hypertensive urgency   . Allergic angioedema 10/19/2019  . Anasarca 10/18/2019  . Angioedema 10/18/2019  . Hypertensive emergency 10/18/2019  . Trichimoniasis 10/17/2019  . Macrocytic anemia 10/05/2019  . Esophageal dysmotility 10/04/2019  . Pressure injury of skin 09/22/2019  . Endometrial cancer ([place]) 09/21/2019  . Left humeral fracture 09/21/2019  . Iron deficiency anemia due to chronic blood loss 09/21/2019  . MSSA bacteremia 09/18/2019  . Postmenopausal bleeding 09/15/2019  . Thrombocytopenia ([place]) 09/15/2019  . Generalized pruritus 09/15/2019  . Benign hypertension with CKD (chronic kidney disease) stage III ([place]) 09/15/2019  . Hyperuricemia 09/15/2019  . Hypokalemia  09/15/2019  . Symptomatic anemia 09/14/2019  . Non compliance w medication regimen 03/24/2015  . Dyslipidemia 03/24/2015  . DM type 2 (diabetes mellitus, type 2) ([place]) 05/23/2014  . HTN (hypertension) 05/23/2014    Orientation RESPIRATION BLADDER Height & Weight     Self, Time, Situation, Place  Normal Indwelling catheter Weight: 260 lb (117.9 kg) Height:  4\' 11"  (149.9 cm)  BEHAVIORAL SYMPTOMS/MOOD NEUROLOGICAL BOWEL NUTRITION STATUS      Continent Diet (see d/c summary)  AMBULATORY STATUS COMMUNICATION OF NEEDS Skin   Extensive Assist Verbally Normal                       Personal Care Assistance Level of Assistance  Bathing, Feeding, Dressing Bathing Assistance: Limited assistance Feeding assistance: Independent Dressing Assistance: Limited assistance     Functional Limitations Info  Sight, Hearing, Speech Sight Info: Impaired Hearing Info: Adequate Speech Info: Adequate    SPECIAL CARE FACTORS FREQUENCY  PT (By licensed PT), OT (By licensed OT)     PT Frequency: 5x/week OT Frequency: 5x/week            Contractures Contractures Info: Not present    Additional Factors Info  Code Status, Allergies Code Status Info: Full Code Allergies Info: Angiotensin Receptor Blockers, Ace Inhibitors           Current Medications (05/25/2020):  This is the current hospital active medication list Current Facility-Administered Medications  Medication Dose Route Frequency Provider Last Rate Last Admin  . acetaminophen (TYLENOL) tablet 500 mg  500 mg Oral Q6H PRN Bonnell Public Tublu, MD      . amLODipine (NORVASC) tablet 10 mg  10 mg  Oral Daily Bonnell Public Tublu, MD   10 mg at 05/25/20 1040  . carvedilol (COREG) tablet 6.25 mg  6.25 mg Oral BID WC Bonnell Public Tublu, MD   6.25 mg at 05/25/20 0837  . Chlorhexidine Gluconate Cloth 2 % PADS 6 each  6 each Topical Daily Vashti Hey, MD   6 each at 05/25/20 1045  . cloNIDine (CATAPRES)  tablet 0.2 mg  0.2 mg Oral BID WC Bonnell Public Tublu, MD   0.2 mg at 05/25/20 0835  . diphenhydrAMINE (BENADRYL) capsule 25 mg  25 mg Oral TID Vashti Hey, MD   25 mg at 05/25/20 1040  . ferrous sulfate tablet 325 mg  325 mg Oral BID WC Bonnell Public Tublu, MD   325 mg at 05/25/20 0835  . furosemide (LASIX) tablet 60 mg  60 mg Oral BID Bonnell Public Tublu, MD   60 mg at 05/25/20 0835  . hydrALAZINE (APRESOLINE) tablet 100 mg  100 mg Oral Q8H Bonnell Public Tublu, MD   100 mg at 05/25/20 0517  . hydrocerin (EUCERIN) cream 1 application  1 application Topical Daily Vashti Hey, MD   1 application at 09/81/19 1042  . hydrocortisone (ANUSOL-HC) suppository 25 mg  25 mg Rectal Daily Bonnell Public Tublu, MD   25 mg at 05/25/20 1037  . loratadine (CLARITIN) tablet 10 mg  10 mg Oral Daily PRN Bonnell Public Tublu, MD      . metolazone (ZAROXOLYN) tablet 5 mg  5 mg Oral Daily Bonnell Public Tublu, MD   5 mg at 05/25/20 1040  . nutrition supplement (JUVEN) (JUVEN) powder packet 1 packet  1 packet Oral BID BM Vashti Hey, MD   1 packet at 05/25/20 1041  . polyethylene glycol (MIRALAX / GLYCOLAX) packet 17 g  17 g Oral Daily Bonnell Public Tublu, MD   17 g at 05/25/20 1041  . sodium bicarbonate tablet 1,300 mg  1,300 mg Oral BID WC Vashti Hey, MD   1,300 mg at 05/25/20 1478     Discharge Medications: Please see discharge summary for a list of discharge medications.  Relevant Imaging Results:  Relevant Lab Results:   Additional Information SSN [id number]  Bethann Berkshire, LCSW

## 2020-05-25 NOTE — Progress Notes (Signed)
Radiation Oncology         [phone] ________________________________  Initial Inpatient Consultation  Name: Vanessa [name] MRN: [id number]  Date: 05/25/2020  DOB: [birth year]  CC:Pcp, No  Everitt Amber, MD   REFERRING PHYSICIAN: Everitt Amber, MD  DIAGNOSIS: The encounter diagnosis was Endometrial cancer [facility]).  Stage I endometrioid adenocarcinoma, FIGO grade 3  HISTORY OF PRESENT ILLNESS::Vanessa [name] is a 64 y.o. female who is seen as a courtesy of Dr. Denman George for an opinion concerning radiation therapy as part of management for her recently diagnosed endometrial cancer. . The patient presented to the ED on 09/14/2019 with complaints of elevated blood pressure and diffuse pruritis. Labs revealed a Hemoglobin level of 4.8 and Platelet count of 115. After further discussion, the patient stated that she had been having intermittent post-menopausal vaginal bleeding for six months. Transabdominal ultrasound of the pelvis was performed and showed a poorly defined endometrium. However, it appeared thickened and measured approximately 8-9 mm. The patient was admitted to the hospital for further evaluation and management.   While hospitalized, an abdominal/pelvic CT scan was performed on 09/15/2019 that was sub-optimal in the absence of IV contrast. However, it did show anemia, a small right-sided pleural effusion, trace abdominal ascites, and an atrophic left kidney that may have been secondary to a distal left ureteral stone that measured 7 mm. On that same day, the patient was evaluated by Dr. Kennon Rounds, OB/GYN, who recommended beginning Megace to stop the bleeding. She also recommended endometrial sampling +/- PAP smear.  As an inpatient, she was evaluated by Dr. Burr Medico on 09/17/2019, during which time it was recommended that she proceed with endometrial biopsy. Echocardiogram on 09/18/2019 showed an EF of 55-60%. PAP smear was performed on 09/19/2019 and revealed atypical endometrial cells. An  endometrial biopsy was also performed on that same day by Dr. Vevelyn Francois that revealed FIGO grade 2 endometrial adenocarcinoma. There was also a fragment of endometrial type polyp. The patient was stabilized and discharged to a skilled nursing facility on 09/25/2019.  The patient was seen in the ED again on 10/04/2019 for low hemoglobin levels and was once again admitted to the hospital for symptomatic anemia/GI bleed. While hospitalized, she underwent an upper endoscopy on 10/08/2019 by Dr. Watt Climes. Results showed a tiny hiatal hernia, a normal stomach, a non-bleeding small duodenal ulcer with no stigmata of bleeding, and a normal duodenum. Pathology from the procedure revealed mild, chronic, inactive gastritis of the stomach without evidence of H. Pylori, dysplasia, or malignancy. The patient was stabilized and discharged back to a skilled nursing facility on 10/10/2019 with a six-week course of IV antibiotics for bacteremia.  Given the above endometrial findings, the patient was referred to Dr. Berline Lopes and was seen in consultation on 10/17/2019. At that time, given her multiple co-morbidities, recent hospitalizations, and ongoing treatment of bacteremia, she was not considered a surgical candidate. Thus, it was recommended that the Megace be discontinued and that a hormonal IUD be placed.  The patient was seen in the ED again on 10/18/2019 with chief complaint of facial swelling. Chest x-ray at that time showed an ill-defined opacity in the right mid and lower lung zones, likely due to a degree of pneumonia with superimposed small pleural effusion. There was also some mild left base atelectasis. No adenopathy was evident. The patient was admitted to the hospital for worsening renal function, diffuse anasarca, markedly elevated BP, and concern for new onset CHF. She was stabilized and discharged on 10/21/2019.  The patient  was seen in the ED again on 11/05/2019 with chief complaint of weakness. She was  diagnosed with COVID-19 pneumonia and was admitted to the hospital. While hospitalized, she underwent a CT of chest, abdomen, and pelvis on 11/07/2019. Results showed multifocal geographic patchy ground-glass opacities throughout both lungs, consistent with COVID-19 pneumonia. It also showed fluid overload with moderate right and small left pleural effusion. Additionally, there was small to moderate volume abdominopelvic ascites, generalized subcutaneous edema of the chest and abdomen consistent with third-spacing, confluent edema in the flank, chronic left renal atrophy, unchanged 7 mm calcification in the left pelvis equivocally within the ureter, and question of nodular hepatic contours.  Given the CT findings above, the patient underwent an abdominal ultrasound on 11/16/2019. Results showed gallbladder sludge with associated gallbladder wall thickening, nodular appearance of the liver which could be seen in patients with cirrhosis, small volume abdominal ascites, and new right-sided pelviectasis of unknown clinical significance. There was no discrete hepatic mass. The patient was stabilized and discharged on 11/22/2019.  The patient was seen in follow-up with Dr. Berline Lopes on 12/02/2019, during which time she underwent a Mirena IUD insertion. She tolerated the procedure well.  Most recently, the patient was seen in the ED on 02/14/2020 for leg and arm pain s/p fall. Chest x-ray showed mildly increased brochovascular lung markings bilaterally without evidence of focal consolidation. There was also noted to be a small right pleural effusion. X-ray of left forearm was negative. X-ray of left humerus showed a proximal humeral fracture. The patient was discharged home with close orthopedic follow-up.   The patient was last seen by Dr. Berline Lopes on 02/21/2020, during which time an endometrial biopsy was performed. Results showed FIGO grade 3 endometrioid adenocarcinoma with focal secretory features.  The  patient's case was discussed at the Gynecologic Oncology Multi-Disciplinary Conference on 03/02/2020. It was recommended that she proceed with radiation/external beam radiation with Heyman's capsules.  Of note, she was admitted to the hospital from 04/22/2020 to 04/30/2020 for bilateral lower leg cellulitis with chronic nonhealing ulcerations. She completed IV antibiotics and was switched to Doxycycline.   Most recently, she was seen in the ED yesterday for worsening vaginal rectal bleeding. Although she appeared non-toxic, her hemoglobin was critically low at 6. She was given a transfusion and admitted to the hospital.   PREVIOUS RADIATION THERAPY: No  PAST MEDICAL HISTORY:  Past Medical History:  Diagnosis Date  . Anemia 10/09/2019  . Asthma   . Cellulitis and abscess of lower extremity 04/22/2020  . CKD (chronic kidney disease) stage 3, GFR 30-59 ml/min (HCC)   . Dental disease   . Diabetes mellitus without complication ([place])   . Endometrial cancer ([place])   . Esophageal dysmotility   . Heart murmur   . HLD (hyperlipidemia)   . Hypertension   . MSSA bacteremia 09/2019  . Obesity, Class III, BMI 40-49.9 (morbid obesity) ([place])   . Pressure ulcer   . Vaginal bleeding 05/2020    PAST SURGICAL HISTORY: Past Surgical History:  Procedure Laterality Date  . BIOPSY  10/08/2019   Procedure: BIOPSY;  Surgeon: Clarene Essex, MD;  Location: [facility] END[facility]COPY;  Service: Endoscopy;;  . ESOPHAGOGASTRODUODENOSCOPY N/A 10/08/2019   Procedure: ESOPHAGOGASTRODUODENOSCOPY (EGD);  Surgeon: Clarene Essex, MD;  Location: [facility];  Service: Endoscopy;  Laterality: N/A;  . TYMPANOSTOMY TUBE PLACEMENT Bilateral     FAMILY HISTORY:  Family History  Problem Relation Age of Onset  . Stroke Mother   . Diabetes Mother   . Hypertension  Mother   . Cancer Mother        possible ovarian  . Diabetes Brother   . Hypertension Brother   . Cancer Maternal Grandmother        possible ovarian    SOCIAL HISTORY:    Social History   Tobacco Use  . Smoking status: Former Research scientist (life sciences)  . Smokeless tobacco: Never Used  . Tobacco comment: " years ago ", >52yrs  Vaping Use  . Vaping Use: Never used  Substance Use Topics  . Alcohol use: Yes    Comment: occasional  . Drug use: No    ALLERGIES:  Allergies  Allergen Reactions  . Ace Inhibitors Other (See Comments)    Never prescribed but she has had angioedema  . Angiotensin Receptor Blockers Other (See Comments)    ARB never Rxed but PMH  of angioedema in March 2021    MEDICATIONS:  No current facility-administered medications for this encounter.   No current outpatient medications on file.   Facility-Administered Medications Ordered in Other Encounters  Medication Dose Route Frequency Provider Last Rate Last Admin  . acetaminophen (TYLENOL) tablet 500 mg  500 mg Oral Q6H PRN Bonnell Public Tublu, MD      . amLODipine (NORVASC) tablet 10 mg  10 mg Oral Daily Bonnell Public Tublu, MD   10 mg at 05/25/20 1040  . carvedilol (COREG) tablet 6.25 mg  6.25 mg Oral BID WC Bonnell Public Tublu, MD   6.25 mg at 05/25/20 1718  . Chlorhexidine Gluconate Cloth 2 % PADS 6 each  6 each Topical Daily Vashti Hey, MD   6 each at 05/25/20 1045  . cloNIDine (CATAPRES) tablet 0.2 mg  0.2 mg Oral BID WC Bonnell Public Tublu, MD   0.2 mg at 05/25/20 1717  . diphenhydrAMINE (BENADRYL) capsule 25 mg  25 mg Oral TID Vashti Hey, MD   25 mg at 05/25/20 1516  . ferrous sulfate tablet 325 mg  325 mg Oral BID WC Bonnell Public Tublu, MD   325 mg at 05/25/20 1718  . furosemide (LASIX) tablet 60 mg  60 mg Oral BID Bonnell Public Tublu, MD   60 mg at 05/25/20 1718  . hydrALAZINE (APRESOLINE) tablet 100 mg  100 mg Oral Q8H Bonnell Public Tublu, MD   100 mg at 05/25/20 1516  . hydrocerin (EUCERIN) cream 1 application  1 application Topical Daily Vashti Hey, MD   1 application at 12/87/86 1042  .  hydrocortisone (ANUSOL-HC) suppository 25 mg  25 mg Rectal Daily Bonnell Public Tublu, MD   25 mg at 05/25/20 1037  . loratadine (CLARITIN) tablet 10 mg  10 mg Oral Daily PRN Bonnell Public Tublu, MD      . metolazone (ZAROXOLYN) tablet 5 mg  5 mg Oral Daily Bonnell Public Tublu, MD   5 mg at 05/25/20 1040  . nutrition supplement (JUVEN) (JUVEN) powder packet 1 packet  1 packet Oral BID BM Vashti Hey, MD   1 packet at 05/25/20 1516  . polyethylene glycol (MIRALAX / GLYCOLAX) packet 17 g  17 g Oral Daily Bonnell Public Tublu, MD   17 g at 05/25/20 1041  . sodium bicarbonate tablet 1,300 mg  1,300 mg Oral BID WC Bonnell Public Tublu, MD   1,300 mg at 05/25/20 1718    REVIEW OF SYSTEMS:  A 10+ POINT REVIEW OF SYSTEMS WAS OBTAINED including neurology, dermatology, psychiatry, cardiac, respiratory, lymph, extremities, GI, GU, musculoskeletal, constitutional, reproductive, HEENT.  Ongoing vaginal bleeding  as above.   PHYSICAL EXAM:  Vitals - 1 value per visit 15/17/6160  SYSTOLIC 737  DIASTOLIC 70  Pulse 63  Temperature 98.4  Respirations 16  Weight (lb)   Height   BMI   VISIT REPORT    Examination deferred.  The patient will be transferred to [facility] long hospital tomorrow radiation planning examination and treatment   ECOG = 3  0 - Asymptomatic (Fully active, able to carry on all predisease activities without restriction)  1 - Symptomatic but completely ambulatory (Restricted in physically strenuous activity but ambulatory and able to carry out work of a light or sedentary nature. For example, light housework, office work)  2 - Symptomatic, <50% in bed during the day (Ambulatory and capable of all self care but unable to carry out any work activities. Up and about more than 50% of waking hours)  3 - Symptomatic, >50% in bed, but not bedbound (Capable of only limited self-care, confined to bed or chair 50% or more of waking hours)  4 - Bedbound  (Completely disabled. Cannot carry on any self-care. Totally confined to bed or chair)  5 - Death   Eustace Pen MM, Creech RH, Tormey DC, et al. [phone]). "Toxicity and response criteria of the [facility]". [place] Oncol. 5 (6): 649-55  LABORATORY DATA:  Lab Results  Component Value Date   WBC 4.6 05/25/2020   HGB 7.9 (L) 05/25/2020   HCT 25.2 (L) 05/25/2020   MCV 94.7 05/25/2020   PLT 165 05/25/2020   NEUTROABS 3.1 04/29/2020   Lab Results  Component Value Date   NA 145 05/25/2020   K 3.6 05/25/2020   CL 109 05/25/2020   CO2 27 05/25/2020   GLUCOSE 129 (H) 05/25/2020   CREATININE 2.58 (H) 05/25/2020   CALCIUM 8.0 (L) 05/25/2020      RADIOGRAPHY: US RENAL  Result Date: 04/29/2020 CLINICAL DATA:  Acute kidney injury. EXAM: RENAL / URINARY TRACT ULTRASOUND COMPLETE COMPARISON:  None. FINDINGS: Right Kidney: Renal measurements: 11.2 x 6.2 x 5.5 cm = volume: 197 mL. Increased echogenicity is noted suggesting medical renal disease. No mass or hydronephrosis visualized. Left Kidney: Renal measurements: 6.1 x 3.8 x 2.6 cm = volume: 32 mL. Increased echogenicity of renal parenchyma is noted suggesting medical renal disease. No mass or hydronephrosis visualized. Bladder: Appears normal for degree of bladder distention. Other: Mild ascites is noted. IMPRESSION: Increased echogenicity of renal parenchyma is noted bilaterally suggesting medical renal disease. Severe left renal atrophy is noted. No hydronephrosis or renal obstruction is noted. Mild ascites. Electronically Signed   By: Marijo Conception M.D.   On: 04/29/2020 15:07      IMPRESSION: Stage I endometrioid adenocarcinoma, FIGO grade 3  In light of the patient's significant comorbidities she is not a candidate for definitive surgery.  Due to transportation issues and living situation patient has been unable to proceed with initial consultation for radiation therapy for consideration for definitive course of  treatment.  As above she has been admitted again with significant vaginal bleeding related to her progressive endometrial cancer.  This evening I contacted Mrs. Bertell Maria by phone. I have discussed with the patient consideration for palliative radiation therapy.  Given her overall situation she is agreeable to this and agreeable to transfer to [facility] for simulation planning and first treatment tomorrow  PLAN: Patient will be transferred in the morning to [facility] long hospital.  she will then proceed with simulation, planning and  her first treatment.  Anticipate 10 radiation treatments directed at the pelvis area.  Total time spent in this encounter was 45 minutes which included reviewing the patient's most recent CT scans, biopsies, ED visits, hospitalizations, consultations, follow-ups,  and documentation.   ------------------------------------------------  Blair Promise, PhD, MD  This document serves as a record of services personally performed by Gery Pray, MD. It was created on his behalf by Clerance Lav, a trained medical scribe. The creation of this record is based on the scribe's personal observations and the provider's statements to them. This document has been checked and approved by the attending provider.

## 2020-05-25 NOTE — Telephone Encounter (Signed)
Called Vanessa [name] and advised her that Dr. Sondra Come with Radiation Oncology will see her this evening in the hospital.  Discussed radiation treatments to help control her vaginal bleeding and she said she would like to have radiation.

## 2020-05-25 NOTE — Plan of Care (Signed)
Reviewed call bell system with the client.  Patient repositioned every two hours for comfort and skin care prevention. Patient ordering from the menu. Monitoring blood sugar and education provided on nutrition by this Probation officer. Call received regarding radiation tx at [facility], patient in agreement to plan and the nurse from the office to call hospitalitis to confirm plan.    Problem: Education: Goal: Knowledge of General Education information will improve Description: Including pain rating scale, medication(s)/side effects and non-pharmacologic comfort measures Outcome: Not Progressing   Problem: Education: Goal: Ability to describe self-care measures that may prevent or decrease complications (Diabetes Survival Skills Education) will improve Outcome: Not Progressing Goal: Individualized Educational Video(s) Outcome: Not Progressing   Problem: Coping: Goal: Ability to adjust to condition or change in health will improve Outcome: Not Progressing   Problem: Fluid Volume: Goal: Ability to maintain a balanced intake and output will improve Outcome: Not Progressing   Problem: Health Behavior/Discharge Planning: Goal: Ability to identify and utilize available resources and services will improve Outcome: Not Progressing Goal: Ability to manage health-related needs will improve Outcome: Not Progressing   Problem: Metabolic: Goal: Ability to maintain appropriate glucose levels will improve Outcome: Not Progressing   Problem: Nutritional: Goal: Maintenance of adequate nutrition will improve Outcome: Not Progressing Goal: Progress toward achieving an optimal weight will improve Outcome: Not Progressing   Problem: Skin Integrity: Goal: Risk for impaired skin integrity will decrease Outcome: Not Progressing   Problem: Tissue Perfusion: Goal: Adequacy of tissue perfusion will improve Outcome: Not Progressing

## 2020-05-25 NOTE — Consult Note (Addendum)
[place] Nurse Consult Note: Patient receiving care in [place] Upon turning patient to her right side, large amount of blood noted on pad under the patient. Spoke with bedside RN Danton Clap), she is aware and patient is experiencing vaginal bleeding.  Secretary called portable for an air mattress but they state they are all in use and will send one when available.  Reason for Consult: Sacral wound Wound type: MASD/IAD in the gluteal fold Measurement: 2.5 cm x 1.5 cm  Wound bed:90% pink/red 10% yellow Drainage (amount, consistency, odor) serosanguinous Periwound: Intact Dressing procedure/placement/frequency: Place a small piece of Aquacel Advantage Kellie Simmering # [phone]) to the gluteal fold, cover with sacral foam. Change daily or PRN soiling Order and place patient on standard size air mattress.   Monitor the wound area(s) for worsening of condition such as: Signs/symptoms of infection, increase in size, development of or worsening of odor, development of pain, or increased pain at the affected locations.   Notify the medical team if any of these develop.  Thank you for the consult. [place] nurse will not follow at this time.   Please re-consult the [place] team if needed.  Cathlean Marseilles Tamala Julian, MSN, RN, [place], [place]le Pearl, [facility] Wound Treatment Associate Pager [phone]

## 2020-05-25 NOTE — TOC Initial Note (Signed)
Transition of Care [facility]) - Initial/Assessment Note    Patient Details  Name: Vanessa [name] MRN: [id number] Date of Birth: 12-Aug-1955  Transition of Care [facility]) CM/SW Contact:    Bethann Berkshire, LCSW Phone Number: 05/25/2020, 2:40 PM  Clinical Narrative:                  CSW called Juliann Pulse from [facility]. Juliann Pulse explained that pt's insurance denied any further rehab and that pt would need long term SNF care which [facility] cannot provide at this point.   CSW spoke with pt who maintains she wants to go to a SNF. CSW explains that may not be possible due to not having long term care coverage and recent denial of rehab. Pt states she was planning to return to her apartment with her husband following rehab but states she is not well enough to go there as her husband is not physically able to assist her and has no other assistance at this point. Pt reports having 2 sons in [place] and 1 daughter in [place] who has 4 kids. CSW completed Fl2 and bed offers sent.   Expected Discharge Plan: Skilled Nursing Facility Barriers to Discharge: SNF Pending bed offer, Continued Medical Work up   Patient Goals and CMS Choice Patient states their goals for this hospitalization and ongoing recovery are:: Go to a snf CMS Medicare.gov Compare Post Acute Care list provided to:: Patient Choice offered to / list presented to : Patient  Expected Discharge Plan and Services Expected Discharge Plan: [place]       Living arrangements for the past 2 months: [place], Apartment                                      Prior Living Arrangements/Services Living arrangements for the past 2 months: [place], [place] with:: Spouse, Facility Resident Patient language and need for interpreter reviewed:: Yes        Need for Family Participation in Patient Care: Yes (Comment) Care giver support system in place?: No (comment)   Criminal  Activity/Legal Involvement Pertinent to Current Situation/Hospitalization: No - Comment as needed  Activities of Daily Living Home Assistive Devices/Equipment: Environmental consultant (specify type) ADL Screening (condition at time of admission) Patient's cognitive ability adequate to safely complete daily activities?: Yes Is the patient deaf or have difficulty hearing?: No Does the patient have difficulty seeing, even when wearing glasses/contacts?: No Does the patient have difficulty concentrating, remembering, or making decisions?: No Patient able to express need for assistance with ADLs?: Yes Does the patient have difficulty dressing or bathing?: No Independently performs ADLs?: Yes (appropriate for developmental age) Does the patient have difficulty walking or climbing stairs?: Yes Weakness of Legs: Both Weakness of Arms/Hands: None  Permission Sought/Granted   Permission granted to share information with : Yes, Verbal Permission Granted  Share Information with NAME: Daughter and husband           Emotional Assessment Appearance:: Appears stated age Attitude/Demeanor/Rapport: Engaged Affect (typically observed): Pleasant Orientation: : Oriented to Self, Oriented to Place, Oriented to  Time Alcohol / Substance Use: Not Applicable Psych Involvement: No (comment)  Admission diagnosis:  Vaginal bleeding [N93.9] Anemia, unspecified type [D64.9] 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 Abnormal uterine bleeding due to primary  malignant neoplasm of endometrium ([place]) 05/24/2020  . Cellulitis 04/23/2020  . Bilateral lower leg cellulitis 04/22/2020  . Neurocognitive deficits 12/11/2019  . Edema 12/04/2019  . AKI (acute kidney injury) ([place]) 12/03/2019  . Hypothermia 11/26/2019  . CKD (chronic kidney disease) stage 3, GFR 30-59 ml/min (HCC)   . Hypernatremia   . COVID-19 virus infection    . Generalized weakness   . Hypertensive urgency   . Allergic angioedema 10/19/2019  . Anasarca 10/18/2019  . Angioedema 10/18/2019  . Hypertensive emergency 10/18/2019  . Trichimoniasis 10/17/2019  . Macrocytic anemia 10/05/2019  . Esophageal dysmotility 10/04/2019  . Pressure injury of skin 09/22/2019  . Endometrial cancer ([place]) 09/21/2019  . Left humeral fracture 09/21/2019  . Iron deficiency anemia due to chronic blood loss 09/21/2019  . MSSA bacteremia 09/18/2019  . Postmenopausal bleeding 09/15/2019  . Thrombocytopenia ([place]) 09/15/2019  . Generalized pruritus 09/15/2019  . Benign hypertension with CKD (chronic kidney disease) stage III ([place]) 09/15/2019  . Hyperuricemia 09/15/2019  . Hypokalemia 09/15/2019  . Symptomatic anemia 09/14/2019  . Non compliance w medication regimen 03/24/2015  . Dyslipidemia 03/24/2015  . DM type 2 (diabetes mellitus, type 2) ([place]) 05/23/2014  . HTN (hypertension) 05/23/2014   PCP:  Pcp, No Pharmacy:  No Pharmacies Listed    Social Determinants of Health (SDOH) Interventions    Readmission Risk Interventions Readmission Risk Prevention Plan 11/08/2019 10/21/2019 09/25/2019  Transportation Screening Complete Complete Complete  PCP or Specialist Appt within 3-5 Days - - Not Complete  Not Complete comments - - plan for SNF  [place] or [place] - - Complete  Social Work Consult for [place] Planning/Counseling - - Complete  Palliative Care Screening - - Not Applicable  Medication Review Press photographer) Complete Referral to Pharmacy Referral to Pharmacy  PCP or Specialist appointment within 3-5 days of discharge Complete Complete -  [place] or Home Care Consult Complete (No Data) -  SW Recovery Care/Counseling Consult Complete Complete -  Palliative Care Screening Not Applicable Not Applicable -  Skilled Nursing Facility Complete Complete -  Some recent data might be hidden

## 2020-05-25 NOTE — Progress Notes (Addendum)
PROGRESS NOTE    Jalynn Waddell  PTW:[id number] DOB: 1955-08-21 DOA: 05/24/2020 PCP: Pcp, No   Brief Narrative:  HPI On 05/24/2020 by Dr. Herminio Heads Dede Dobesh is an 64 y.o. female with PMH significant for recent diagnosis of endometrial CA, HTN, CKD 3, diet-controlled diabetes and recent admission for lower extremity cellulitis who was sent over from her rehab facility for significant increase in her vaginal bleeding.  Patient apparently has chronic vaginal bleeding however over the past couple of days apparently she has been discharging a lot of clots and much more bleeding than usual.  Patient herself is unaware of this as she spends most of her day in bed and wears a diaper.  She was alarmed however when she saw how much blood there was in her diaper earlier today.  She agreed with staff at the rehab facility that she needed to come to the ED.  Patient admits to feeling somewhat weak.  She notes "I do not feel good but I do not feel as bad as I could".  She denies any chest pain.  She does admit to having some shortness of breath earlier today however that improved with sitting up.  She denies general orthopnea or PND usually.  Patient admits she does not exert herself very much.  Denies any dizziness while lying in bed.  Patient specifically denies history of congestive heart failure or pulmonary edema.  Denies known history of CAD or MI.  Echocardiogram done March 2021 shows EF of 50 to 60% with grade 2 diastolic dysfunction.  She does have moderately elevated PA pressures with peak pressure of 57.  Assessment & Plan   Symptomatic anemia/ Acute on chronic normocytic anemia/ Vaginal bleeding secondary to endometrial cancer -Patient presented with shortness of breath and hemoglobin of 6 (baseline 7-8) -Was transfused 2 units PRBC, hemoglobin currently 7.9 -Continue iron supplementation -Gyn Onc consulted and appreciated -Patient has an IUD however tells me that she was  unable to make it to her chemo/radiation treatments -Continue to monitor CBC  Hemorrhoids -Continue HC suppositories -?  If they are contributing to patient's anemia  Abnormal EKG -Patient noted to have narrow complex bradycardia -QTC was prolonged 507 -Levaquin held -Continue to monitor  Levaquin use -Patient was started on a 7-day course of Levaquin 05/22/2020, unclear indication -Given her prolonged QTC, medication held -Currently no evidence of pneumonia or urinary tract infection  Essential hypertension -Continue amlodipine, clonidine, Coreg, hydralazine, Lasix and metolazone  Diabetes mellitus, type II -Suspect diet controlled -Hemoglobin A1c 5.9 on 04/22/2020 -Patient on any medications at home  Chronic kidney disease, stage IV -Creatinine appears to be at baseline and stable  Stage II sacral ulcer -Present on -Wound care consulted  DVT Prophylaxis  SCDs  Code Status: Full  Family Communication: None at bedside  Disposition Plan:  Status is: Observation  The patient will require care spanning > 2 midnights and should be moved to inpatient because: Ongoing diagnostic testing needed not appropriate for outpatient work up and Inpatient level of care appropriate due to severity of illness  Dispo: The patient is from: Home              Anticipated d/c is to: TBD              Anticipated d/c date is: 2 days              Patient currently is not medically stable to d/c.   Consultants Gyn/onc  Procedures  None  Antibiotics   Anti-infectives (From admission, onward)   Start     Dose/Rate Route Frequency Ordered Stop   05/25/20 1800  levofloxacin (LEVAQUIN) tablet 750 mg  Status:  Discontinued        750 mg Oral Every 48 hours 05/24/20 1549 05/24/20 1610      Subjective:   [name] seen and examined today.  Feels breathing has improved but also feels that it is better when she sits up.  Denies current dizziness or headache, chest pain, abdominal pain,  nausea or vomiting, diarrhea or constipation. Objective:   Vitals:   05/24/20 2311 05/25/20 0417 05/25/20 0954 05/25/20 1303  BP: (!) 155/79 (!) 151/76 (!) 152/65 (!) 153/70  Pulse: 70 61 62 63  Resp: 17 18 16 16   Temp: 97.7 F (36.5 C) 97.9 F (36.6 C) 97.9 F (36.6 C)   TempSrc: Oral Oral Oral   SpO2: 99% 98% 98% 95%  Weight:      Height:        Intake/Output Summary (Last 24 hours) at 05/25/2020 1358 Last data filed at 05/25/2020 3818 Gross per 24 hour  Intake 240 ml  Output 2200 ml  Net -1960 ml   Filed Weights   05/24/20 1245  Weight: 117.9 kg    Exam  General: Well developed, well nourished, NAD, appears stated age  HEENT: NCAT, mucous membranes moist.   Cardiovascular: S1 S2 auscultated, RRR  Respiratory: Clear to auscultation bilaterally   Abdomen: Soft, nontender, nondistended, + bowel sounds  Extremities: warm dry without cyanosis clubbing. LE edema bilaterally  Neuro: AAOx3, nonfocal  Psych: Appropriate mood and affect, pleasant    Data Reviewed: I have personally reviewed following labs and imaging studies  CBC: Recent Labs  Lab 05/24/20 1340 05/25/20 0308  WBC 5.2 4.6  HGB 6.0* 7.9*  HCT 20.7* 25.2*  MCV 96.7 94.7  PLT 183 299   Basic Metabolic Panel: Recent Labs  Lab 05/24/20 1340 05/25/20 0308  NA 145 145  K 3.5 3.6  CL 110 109  CO2 25 27  GLUCOSE 113* 129*  BUN 38* 40*  CREATININE 2.55* 2.58*  CALCIUM 8.0* 8.0*   GFR: Estimated Creatinine Clearance: 25.1 mL/min (A) (by C-G formula based on SCr of 2.58 mg/dL (H)). Liver Function Tests: No results for input(s): AST, ALT, ALKPHOS, BILITOT, PROT, ALBUMIN in the last 168 hours. No results for input(s): LIPASE, AMYLASE in the last 168 hours. No results for input(s): AMMONIA in the last 168 hours. Coagulation Profile: No results for input(s): INR, PROTIME in the last 168 hours. Cardiac Enzymes: No results for input(s): CKTOTAL, CKMB, CKMBINDEX, TROPONINI in the last 168  hours. BNP (last 3 results) No results for input(s): PROBNP in the last 8760 hours. HbA1C: No results for input(s): HGBA1C in the last 72 hours. CBG: Recent Labs  Lab 05/25/20 0611  GLUCAP 109*   Lipid Profile: No results for input(s): CHOL, HDL, LDLCALC, TRIG, CHOLHDL, LDLDIRECT in the last 72 hours. Thyroid Function Tests: No results for input(s): TSH, T4TOTAL, FREET4, T3FREE, THYROIDAB in the last 72 hours. Anemia Panel: No results for input(s): VITAMINB12, FOLATE, FERRITIN, TIBC, IRON, RETICCTPCT in the last 72 hours. Urine analysis:    Component Value Date/Time   COLORURINE AMBER (A) 04/29/2020 1648   APPEARANCEUR HAZY (A) 04/29/2020 1648   LABSPEC 1.017 04/29/2020 1648   PHURINE 5.0 04/29/2020 1648   GLUCOSEU NEGATIVE 04/29/2020 1648   HGBUR LARGE (A) 04/29/2020 [place] 04/29/2020 1648  KETONESUR NEGATIVE 04/29/2020 1648   PROTEINUR 100 (A) 04/29/2020 1648   UROBILINOGEN 0.2 03/06/2014 1644   NITRITE NEGATIVE 04/29/2020 1648   LEUKOCYTESUR TRACE (A) 04/29/2020 1648   Sepsis Labs: @LABRCNTIP (procalcitonin:4,lacticidven:4)  ) Recent Results (from the past 240 hour(s))  Respiratory Panel by RT PCR (Flu A&B, Covid) - Nasopharyngeal Swab     Status: None   Collection Time: 05/24/20  3:38 PM   Specimen: Nasopharyngeal Swab  Result Value Ref Range Status   SARS Coronavirus 2 by RT PCR NEGATIVE NEGATIVE Final    Comment: (NOTE) SARS-CoV-2 target nucleic acids are NOT DETECTED.  The SARS-CoV-2 RNA is generally detectable in upper respiratoy specimens during the acute phase of infection. The lowest concentration of SARS-CoV-2 viral copies this assay can detect is 131 copies/mL. A negative result does not preclude SARS-Cov-2 infection and should not be used as the sole basis for treatment or other patient management decisions. A negative result may occur with  improper specimen collection/handling, submission of specimen other than nasopharyngeal  swab, presence of viral mutation(s) within the areas targeted by this assay, and inadequate number of viral copies (<131 copies/mL). A negative result must be combined with clinical observations, patient history, and epidemiological information. The expected result is Negative.  Fact Sheet for Patients:  PinkCheek.be  Fact Sheet for Healthcare Providers:  GravelBags.it  This test is no t yet approved or cleared by the [place] FDA and  has been authorized for detection and/or diagnosis of SARS-CoV-2 by FDA under an Emergency Use Authorization (EUA). This EUA will remain  in effect (meaning this test can be used) for the duration of the COVID-19 declaration under Section 564(b)(1) of the Act, 21 U.S.C. section 360bbb-3(b)(1), unless the authorization is terminated or revoked sooner.     Influenza A by PCR NEGATIVE NEGATIVE Final   Influenza B by PCR NEGATIVE NEGATIVE Final    Comment: (NOTE) The Xpert Xpress SARS-CoV-2/FLU/RSV assay is intended as an aid in  the diagnosis of influenza from Nasopharyngeal swab specimens and  should not be used as a sole basis for treatment. Nasal washings and  aspirates are unacceptable for Xpert Xpress SARS-CoV-2/FLU/RSV  testing.  Fact Sheet for Patients: PinkCheek.be  Fact Sheet for Healthcare Providers: GravelBags.it  This test is not yet approved or cleared by the [place] FDA and  has been authorized for detection and/or diagnosis of SARS-CoV-2 by  FDA under an Emergency Use Authorization (EUA). This EUA will remain  in effect (meaning this test can be used) for the duration of the  Covid-19 declaration under Section 564(b)(1) of the Act, 21  U.S.C. section 360bbb-3(b)(1), unless the authorization is  terminated or revoked. Performed at [facility], [place] [address] [place], [place] [address]        Radiology Studies: No results found.   Scheduled Meds: . amLODipine  10 mg Oral Daily  . carvedilol  6.25 mg Oral BID WC  . Chlorhexidine Gluconate Cloth  6 each Topical Daily  . cloNIDine  0.2 mg Oral BID WC  . diphenhydrAMINE  25 mg Oral TID  . ferrous sulfate  325 mg Oral BID WC  . furosemide  60 mg Oral BID  . hydrALAZINE  100 mg Oral Q8H  . hydrocerin  1 application Topical Daily  . hydrocortisone  25 mg Rectal Daily  . metolazone  5 mg Oral Daily  . nutrition supplement (JUVEN)  1 packet Oral BID BM  . polyethylene glycol  17 g Oral  Daily  . sodium bicarbonate  1,300 mg Oral BID WC   Continuous Infusions:   LOS: 0 days   Time Spent in minutes   45 minutes  Iza Preston D.O. on 05/25/2020 at 1:58 PM  Between 7am to 7pm - Please see pager noted on [email or web address]  After 7pm go to [email or web address]  And look for the night coverage person covering for me after hours  [facility] Office  [phone]

## 2020-05-25 NOTE — Progress Notes (Signed)
Upon changing the patient, noted 2 huge clots with bleeding. Patient denies any pain at this time.

## 2020-05-26 ENCOUNTER — Ambulatory Visit
Admit: 2020-05-26 | Discharge: 2020-05-26 | Disposition: A | Discharge status: Home / Self Care | Payer: Medicare Other | Attending: Radiation Oncology | Admitting: Radiation Oncology

## 2020-05-26 DIAGNOSIS — C541 Malignant neoplasm of endometrium: Secondary | ICD-10-CM | POA: Diagnosis not present

## 2020-05-26 DIAGNOSIS — D500 Iron deficiency anemia secondary to blood loss (chronic): Secondary | ICD-10-CM | POA: Diagnosis not present

## 2020-05-26 DIAGNOSIS — N939 Abnormal uterine and vaginal bleeding, unspecified: Secondary | ICD-10-CM | POA: Diagnosis not present

## 2020-05-26 LAB — GLUCOSE, CAPILLARY
Glucose-Capillary: 141 mg/dL — ABNORMAL HIGH (ref 70–99)
Glucose-Capillary: 125 mg/dL — ABNORMAL HIGH (ref 70–99)

## 2020-05-26 LAB — BASIC METABOLIC PANEL
Anion gap: 10 (ref 5–15)
BUN: 37 mg/dL — ABNORMAL HIGH (ref 8–23)
CO2: 28 mmol/L (ref 22–32)
Calcium: 8.1 mg/dL — ABNORMAL LOW (ref 8.9–10.3)
Chloride: 106 mmol/L (ref 98–111)
Creatinine, Ser: 2.72 mg/dL — ABNORMAL HIGH (ref 0.44–1.00)
GFR, Estimated: 19 mL/min — ABNORMAL LOW (ref >60)
Glucose, Bld: 141 mg/dL — ABNORMAL HIGH (ref 70–99)
Potassium: 3.4 mmol/L — ABNORMAL LOW (ref 3.5–5.1)
Sodium: 144 mmol/L (ref 135–145)

## 2020-05-26 LAB — CBC
HCT: 21.3 % — ABNORMAL LOW (ref 36.0–46.0)
Hemoglobin: 6.8 g/dL — CL (ref 12.0–15.0)
MCH: 29.7 pg (ref 26.0–34.0)
MCHC: 31.9 g/dL (ref 30.0–36.0)
MCV: 93 fL (ref 80.0–100.0)
Platelets: 172 10*3/uL (ref 150–400)
RBC: 2.29 MIL/uL — ABNORMAL LOW (ref 3.87–5.11)
RDW: 16.5 % — ABNORMAL HIGH (ref 11.5–15.5)
WBC: 5.7 10*3/uL (ref 4.0–10.5)
nRBC: 0 % (ref 0.0–0.2)

## 2020-05-26 LAB — PREPARE RBC (CROSSMATCH)

## 2020-05-26 MED ORDER — SODIUM CHLORIDE 0.9% IV SOLUTION: Freq: Once | INTRAVENOUS | Status: AC

## 2020-05-26 NOTE — Progress Notes (Signed)
Called received from [place], patient placement that the patient will be going to room 1603 at [facility].  This writer called [facility] department and left message of bed placement.  Report called to [facility] at [facility] [phone].

## 2020-05-26 NOTE — Progress Notes (Signed)
CRITICAL VALUE ALERT  Critical Value:  HGB 6.8 Date & Time Notied:  05/26/20 0210  Provider Notified: M. Sharlet Salina, MD  Orders Received/Actions taken: 2 units PRBCs

## 2020-05-26 NOTE — Progress Notes (Signed)
PT Cancellation Note  Patient Details Name: Munira Polson MRN: [id number] DOB: 03-16-1956   Cancelled Treatment:    Reason Eval/Treat Not Completed: Medical issues which prohibited therapy Patient's Hgb 6.8 and receiving transfusion. PT will re-attempt as time allows following transfusion completion and recheck of Hgb.   Perrin Maltese, PT, DPT Acute Rehabilitation Services Pager [phone] Office [phone]    Alda Lea 05/26/2020, 7:55 AM

## 2020-05-26 NOTE — Progress Notes (Signed)
Patient alert and oriented, 2nd unit of blood completed.Patient for transfer to [facility] for Radiation marking at 0930 and tx at 1400. [facility] contacted for transport to be transferred at approximately o900. Report given to transport team. Report given to Jeani Hawking, Nurse placement for bed availability at [facility] long. No bed available at this time. Spoke with Lattie Haw RN at Radiation and will obtain H & H at 1030 and provide lunch and monitor till 1400 tx appt.  All patient belongs sent with patient anticipating bed available post tx.

## 2020-05-26 NOTE — Progress Notes (Signed)
1030 Patient received in [facility] room 1 after her CT Simulation. Patient holding for her radiation treatment today and is a transfer to [facility] from [facility] at [place]. Patient awake, alert and oriented x 3.  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 Foley catheter intact and draining clear light yellow urine into drainage bag.  Patient denies pain at this time. 1100 Inpatient lab came to draw patient's H&H. 1140 Patient given a breakfast tray. 1240 Patient watching TV and foley drainage bag emptied of 900 cc.  1400 Patient transported to radiation for her treatment by stretcher. Patient will be brought to her new room assignment after her tx which is room 1603. RN called her floor nurse and updated her on her stay in the clinic.

## 2020-05-26 NOTE — Progress Notes (Signed)
PROGRESS NOTE    Vanessa [name]  FTD:[id number] DOB: 1955-08-14 DOA: 05/24/2020 PCP: Pcp, No   Brief Narrative:  HPI On 05/24/2020 by Dr. Herminio Heads [name] is an 64 y.o. female with PMH significant for recent diagnosis of endometrial CA, HTN, CKD 3, diet-controlled diabetes and recent admission for lower extremity cellulitis who was sent over from her rehab facility for significant increase in her vaginal bleeding.  Patient apparently has chronic vaginal bleeding however over the past couple of days apparently she has been discharging a lot of clots and much more bleeding than usual.  Patient herself is unaware of this as she spends most of her day in bed and wears a diaper.  She was alarmed however when she saw how much blood there was in her diaper earlier today.  She agreed with staff at the rehab facility that she needed to come to the ED.  Patient admits to feeling somewhat weak.  She notes "I do not feel good but I do not feel as bad as I could".  She denies any chest pain.  She does admit to having some shortness of breath earlier today however that improved with sitting up.  She denies general orthopnea or PND usually.  Patient admits she does not exert herself very much.  Denies any dizziness while lying in bed.  Patient specifically denies history of congestive heart failure or pulmonary edema.  Denies known history of CAD or MI.  Echocardiogram done March 2021 shows EF of 50 to 60% with grade 2 diastolic dysfunction.  She does have moderately elevated PA pressures with peak pressure of 57.  Interim history Admitted with symptomatic anemia, vaginal bleeding due to endometrial cancer.  Gynecology oncology consulted as well as radiation oncology.  Patient was transfused 2 units on admission today as her hemoglobin dropped again to 6.8.  Plan is for transfer to [facility] for radiation therapy.  Assessment & Plan   Symptomatic anemia/ Acute on chronic normocytic  anemia/ Vaginal bleeding secondary to endometrial cancer -Patient presented with shortness of breath and hemoglobin of 6 (baseline 7-8) -Was transfused 2 units PRBC on admission. -Hemoglobin today was 6.8, transfusing 2 additional units -Continue iron supplementation -Patient has an IUD however tells me that she was unable to make it to her chemo/radiation treatments -Gyn Onc consulted and appreciated and recommended radiation oncology consultation -Radiation oncology recommended 10 radiation treatments and transfer to [facility] to monitor CBC  Hemorrhoids -Continue HC suppositories -?  If they are contributing to patient's anemia  Abnormal EKG -Patient noted to have narrow complex bradycardia -QTC was prolonged 507 -Levaquin held -Continue to monitor  Levaquin use -Patient was started on a 7-day course of Levaquin 05/22/2020, unclear indication -Given her prolonged QTC, medication held -Currently no evidence of pneumonia or urinary tract infection  Essential hypertension -Continue amlodipine, clonidine, Coreg, hydralazine, Lasix and metolazone  Diabetes mellitus, type II -Suspect diet controlled -Hemoglobin A1c 5.9 on 04/22/2020 -Patient on any medications at home  Chronic kidney disease, stage IV -Creatinine appears to be at baseline and stable  Stage II sacral ulcer -Present on -Wound care consulted  Hypokalemia -Will replace and continue to monitor BMP  Deconditioning -PT consulted  DVT Prophylaxis  SCDs  Code Status: Full  Family Communication: None at bedside  Disposition Plan:  Status is: Inpatient  Remains inpatient appropriate because:Ongoing diagnostic testing needed not appropriate for outpatient work up, Inpatient level of care appropriate due to severity of illness  and ongoing bleeding. Planning for radiation treatments.    Dispo: The patient is from: Home              Anticipated d/c is to: TBD              Anticipated d/c date is:  > 3 days              Patient currently is not medically stable to d/c.   Consultants Gyn/onc Radiation oncology  Procedures  None  Antibiotics   Anti-infectives (From admission, onward)   Start     Dose/Rate Route Frequency Ordered Stop   05/25/20 1800  levofloxacin (LEVAQUIN) tablet 750 mg  Status:  Discontinued        750 mg Oral Every 48 hours 05/24/20 1549 05/24/20 1610      Subjective:   [name] seen and examined today.  Patient feeling tired this morning but feels that her breathing has improved mildly.  Feels that she needs to set up as she has more shortness of breath when laying down.  Denies current chest pain, abdominal pain, nausea or vomiting, diarrhea or constipation, dizziness or headache.   Objective:   Vitals:   05/26/20 0325 05/26/20 0545 05/26/20 0608 05/26/20 0857  BP: (!) 149/69 (!) 165/78 (!) 161/76 (!) 158/73  Pulse: 64 66 65 69  Resp:  18 18 18   Temp: 98.2 F (36.8 C) 97.6 F (36.4 C) 98.1 F (36.7 C) 98.2 F (36.8 C)  TempSrc: Oral Oral Oral Oral  SpO2: 95% 98% 97% 100%  Weight:      Height:        Intake/Output Summary (Last 24 hours) at 05/26/2020 0942 Last data filed at 05/26/2020 0748 Gross per 24 hour  Intake 180 ml  Output 2600 ml  Net -2420 ml   Filed Weights   05/24/20 1245  Weight: 117.9 kg   Exam  General: Well developed, well nourished, NAD, appears stated age  HEENT: NCAT, mucous membranes moist.   Cardiovascular: S1 S2 auscultated, RRR  Respiratory: Clear to auscultation bilaterally   Abdomen: Soft, nontender, nondistended, + bowel sounds  Extremities: warm dry without cyanosis clubbing. +++LE edema B/L   Neuro: AAOx3, nonfocal  Psych: Appropriate mood and affect, pleasant   Data Reviewed: I have personally reviewed following labs and imaging studies  CBC: Recent Labs  Lab 05/24/20 1340 05/25/20 0308 05/26/20 0045  WBC 5.2 4.6 5.7  HGB 6.0* 7.9* 6.8*  HCT 20.7* 25.2* 21.3*  MCV 96.7 94.7 93.0    PLT 183 165 244   Basic Metabolic Panel: Recent Labs  Lab 05/24/20 1340 05/25/20 0308 05/26/20 0045  NA 145 145 144  K 3.5 3.6 3.4*  CL 110 109 106  CO2 25 27 28   GLUCOSE 113* 129* 141*  BUN 38* 40* 37*  CREATININE 2.55* 2.58* 2.72*  CALCIUM 8.0* 8.0* 8.1*   GFR: Estimated Creatinine Clearance: 23.8 mL/min (A) (by C-G formula based on SCr of 2.72 mg/dL (H)). Liver Function Tests: No results for input(s): AST, ALT, ALKPHOS, BILITOT, PROT, ALBUMIN in the last 168 hours. No results for input(s): LIPASE, AMYLASE in the last 168 hours. No results for input(s): AMMONIA in the last 168 hours. Coagulation Profile: No results for input(s): INR, PROTIME in the last 168 hours. Cardiac Enzymes: No results for input(s): CKTOTAL, CKMB, CKMBINDEX, TROPONINI in the last 168 hours. BNP (last 3 results) No results for input(s): PROBNP in the last 8760 hours. HbA1C: No results for input(s): HGBA1C  in the last 72 hours. CBG: Recent Labs  Lab 05/25/20 0611 05/25/20 1903 05/26/20 0747  GLUCAP 109* 134* 141*   Lipid Profile: No results for input(s): CHOL, HDL, LDLCALC, TRIG, CHOLHDL, LDLDIRECT in the last 72 hours. Thyroid Function Tests: No results for input(s): TSH, T4TOTAL, FREET4, T3FREE, THYROIDAB in the last 72 hours. Anemia Panel: No results for input(s): VITAMINB12, FOLATE, FERRITIN, TIBC, IRON, RETICCTPCT in the last 72 hours. Urine analysis:    Component Value Date/Time   COLORURINE AMBER (A) 04/29/2020 1648   APPEARANCEUR HAZY (A) 04/29/2020 1648   LABSPEC 1.017 04/29/2020 1648   PHURINE 5.0 04/29/2020 1648   GLUCOSEU NEGATIVE 04/29/2020 1648   HGBUR LARGE (A) 04/29/2020 1648   BILIRUBINUR NEGATIVE 04/29/2020 1648   KETONESUR NEGATIVE 04/29/2020 1648   PROTEINUR 100 (A) 04/29/2020 1648   UROBILINOGEN 0.2 03/06/2014 1644   NITRITE NEGATIVE 04/29/2020 1648   LEUKOCYTESUR TRACE (A) 04/29/2020 1648   Sepsis Labs: @LABRCNTIP (procalcitonin:4,lacticidven:4)  ) Recent  Results (from the past 240 hour(s))  Respiratory Panel by RT PCR (Flu A&B, Covid) - Nasopharyngeal Swab     Status: None   Collection Time: 05/24/20  3:38 PM   Specimen: Nasopharyngeal Swab  Result Value Ref Range Status   SARS Coronavirus 2 by RT PCR NEGATIVE NEGATIVE Final    Comment: (NOTE) SARS-CoV-2 target nucleic acids are NOT DETECTED.  The SARS-CoV-2 RNA is generally detectable in upper respiratoy specimens during the acute phase of infection. The lowest concentration of SARS-CoV-2 viral copies this assay can detect is 131 copies/mL. A negative result does not preclude SARS-Cov-2 infection and should not be used as the sole basis for treatment or other patient management decisions. A negative result may occur with  improper specimen collection/handling, submission of specimen other than nasopharyngeal swab, presence of viral mutation(s) within the areas targeted by this assay, and inadequate number of viral copies (<131 copies/mL). A negative result must be combined with clinical observations, patient history, and epidemiological information. The expected result is Negative.  Fact Sheet for Patients:  PinkCheek.be  Fact Sheet for Healthcare Providers:  GravelBags.it  This test is no t yet approved or cleared by the [place] FDA and  has been authorized for detection and/or diagnosis of SARS-CoV-2 by FDA under an Emergency Use Authorization (EUA). This EUA will remain  in effect (meaning this test can be used) for the duration of the COVID-19 declaration under Section 564(b)(1) of the Act, 21 U.S.C. section 360bbb-3(b)(1), unless the authorization is terminated or revoked sooner.     Influenza A by PCR NEGATIVE NEGATIVE Final   Influenza B by PCR NEGATIVE NEGATIVE Final    Comment: (NOTE) The Xpert Xpress SARS-CoV-2/FLU/RSV assay is intended as an aid in  the diagnosis of influenza from Nasopharyngeal swab  specimens and  should not be used as a sole basis for treatment. Nasal washings and  aspirates are unacceptable for Xpert Xpress SARS-CoV-2/FLU/RSV  testing.  Fact Sheet for Patients: PinkCheek.be  Fact Sheet for Healthcare Providers: GravelBags.it  This test is not yet approved or cleared by the [place] FDA and  has been authorized for detection and/or diagnosis of SARS-CoV-2 by  FDA under an Emergency Use Authorization (EUA). This EUA will remain  in effect (meaning this test can be used) for the duration of the  Covid-19 declaration under Section 564(b)(1) of the Act, 21  U.S.C. section 360bbb-3(b)(1), unless the authorization is  terminated or revoked. Performed at [facility], [address],  [place] [address]       Radiology Studies: No results found.   Scheduled Meds: . amLODipine  10 mg Oral Daily  . carvedilol  6.25 mg Oral BID WC  . Chlorhexidine Gluconate Cloth  6 each Topical Daily  . cloNIDine  0.2 mg Oral BID WC  . diphenhydrAMINE  25 mg Oral TID  . ferrous sulfate  325 mg Oral BID WC  . furosemide  60 mg Oral BID  . hydrALAZINE  100 mg Oral Q8H  . hydrocerin  1 application Topical Daily  . hydrocortisone  25 mg Rectal Daily  . metolazone  5 mg Oral Daily  . nutrition supplement (JUVEN)  1 packet Oral BID BM  . polyethylene glycol  17 g Oral Daily  . sodium bicarbonate  1,300 mg Oral BID WC   Continuous Infusions:   LOS: 1 day   Time Spent in minutes   45 minutes  Molleigh Huot D.O. on 05/26/2020 at 9:42 AM  Between 7am to 7pm - Please see pager noted on [email or web address]  After 7pm go to [email or web address]  And look for the night coverage person covering for me after hours  [facility] Office  [phone]

## 2020-05-27 ENCOUNTER — Ambulatory Visit
Admit: 2020-05-27 | Discharge: 2020-05-27 | Disposition: A | Discharge status: Home / Self Care | Payer: Medicare Other | Attending: Radiation Oncology | Admitting: Radiation Oncology

## 2020-05-27 DIAGNOSIS — I129 Hypertensive chronic kidney disease with stage 1 through stage 4 chronic kidney disease, or unspecified chronic kidney disease: Secondary | ICD-10-CM | POA: Diagnosis not present

## 2020-05-27 DIAGNOSIS — N939 Abnormal uterine and vaginal bleeding, unspecified: Secondary | ICD-10-CM | POA: Diagnosis not present

## 2020-05-27 DIAGNOSIS — R609 Edema, unspecified: Secondary | ICD-10-CM | POA: Diagnosis not present

## 2020-05-27 DIAGNOSIS — E1122 Type 2 diabetes mellitus with diabetic chronic kidney disease: Secondary | ICD-10-CM | POA: Diagnosis not present

## 2020-05-27 LAB — TYPE AND SCREEN
ABO/RH(D): O POS
Antibody Screen: NEGATIVE
Unit division: 0
Unit division: 0
Unit division: 0
Unit division: 0

## 2020-05-27 LAB — CBC
HCT: 25.3 % — ABNORMAL LOW (ref 36.0–46.0)
Hemoglobin: 8.2 g/dL — ABNORMAL LOW (ref 12.0–15.0)
MCH: 30.3 pg (ref 26.0–34.0)
MCHC: 32.4 g/dL (ref 30.0–36.0)
MCV: 93.4 fL (ref 80.0–100.0)
Platelets: 173 10*3/uL (ref 150–400)
RBC: 2.71 MIL/uL — ABNORMAL LOW (ref 3.87–5.11)
RDW: 16.7 % — ABNORMAL HIGH (ref 11.5–15.5)
WBC: 6.7 10*3/uL (ref 4.0–10.5)
nRBC: 0 % (ref 0.0–0.2)

## 2020-05-27 LAB — BASIC METABOLIC PANEL
Anion gap: 7 (ref 5–15)
BUN: 35 mg/dL — ABNORMAL HIGH (ref 8–23)
CO2: 32 mmol/L (ref 22–32)
Calcium: 8.1 mg/dL — ABNORMAL LOW (ref 8.9–10.3)
Chloride: 102 mmol/L (ref 98–111)
Creatinine, Ser: 2.48 mg/dL — ABNORMAL HIGH (ref 0.44–1.00)
GFR, Estimated: 21 mL/min — ABNORMAL LOW (ref >60)
Glucose, Bld: 125 mg/dL — ABNORMAL HIGH (ref 70–99)
Potassium: 3.2 mmol/L — ABNORMAL LOW (ref 3.5–5.1)
Sodium: 141 mmol/L (ref 135–145)

## 2020-05-27 LAB — BPAM RBC
Blood Product Expiration Date: 202112172359
Blood Product Expiration Date: 202112172359
Blood Product Expiration Date: 202112172359
Blood Product Expiration Date: 202112172359
ISSUE DATE / TIME: 202111141638
ISSUE DATE / TIME: 202111142027
ISSUE DATE / TIME: 202111160254
ISSUE DATE / TIME: 202111160539
Unit Type and Rh: 5100
Unit Type and Rh: 5100
Unit Type and Rh: 5100
Unit Type and Rh: 5100

## 2020-05-27 LAB — GLUCOSE, CAPILLARY
Glucose-Capillary: 132 mg/dL — ABNORMAL HIGH (ref 70–99)
Glucose-Capillary: 148 mg/dL — ABNORMAL HIGH (ref 70–99)
Glucose-Capillary: 148 mg/dL — ABNORMAL HIGH (ref 70–99)

## 2020-05-27 MED ORDER — POTASSIUM CHLORIDE CRYS ER 20 MEQ PO TBCR
30.0000 meq | EXTENDED_RELEASE_TABLET | Freq: Two times a day (BID) | ORAL | Status: AC
  Administered 2020-05-27 (×2): 30 meq via ORAL
  Filled 2020-05-27 (×2): qty 1

## 2020-05-27 NOTE — Evaluation (Signed)
Physical Therapy Evaluation Patient Details Name: Vanessa [name] MRN: [id number] DOB: 09-06-1955 Today's Date: 05/27/2020   History of Present Illness  Vanessa [name] is an 64 y.o. female with PMH significant for recent diagnosis of endometrial CA, HTN, CKD 3, diet-controlled diabetes and recent admission for lower extremity cellulitis who was sent over from her rehab facility for significant increase in her vaginal bleeding  Clinical Impression  Patient received in recliner. Evaluation limited due to going to radiation. Assisted with 2 person stand and pivot slowly to [facility]. Patient supported self on WC arms  And 1 HHA to turn to sit down.  Patient is coming from SNF/rehab. Patient reports  That she has limited ambulation, does participate in exercises.  Pt admitted with above diagnosis.  Pt currently with functional limitations due to the deficits listed below (see PT Problem List). Pt will benefit from skilled PT to increase their independence and safety with mobility to allow discharge to the venue listed below.       Follow Up Recommendations SNF;Supervision/Assistance - 24 hour    Equipment Recommendations  None recommended by PT    Recommendations for Other Services       Precautions / Restrictions Precautions Precautions: Fall Precaution Comments: vaginal bleeding      Mobility  Bed Mobility               General bed mobility comments: in recliner    Transfers Overall transfer level: Needs assistance   Transfers: Sit to/from Stand;Stand Pivot Transfers Sit to Stand: Mod assist Stand pivot transfers: Mod assist       General transfer comment: slow   steady assist to rise from the recliner. patient reaches for [facility] and slowly turned around. Noted blood clot on pad in recliner upon standing  Ambulation/Gait                Stairs            Wheelchair Mobility    Modified Rankin (Stroke Patients Only)       Balance Overall balance  assessment: Needs assistance Sitting-balance support: Feet supported Sitting balance-Leahy Scale: Fair     Standing balance support: During functional activity;Bilateral upper extremity supported Standing balance-Leahy Scale: Poor Standing balance comment: reliant on UE support to stand                             Pertinent Vitals/Pain Pain Assessment: No/denies pain    Home Living                   Additional Comments: from SNF, reports that she stands with Rw with assistance, may take a few steps.    Prior Function           Comments: has been in rehab facility     Hand Dominance        Extremity/Trunk Assessment   Upper Extremity Assessment Upper Extremity Assessment: Generalized weakness    Lower Extremity Assessment Lower Extremity Assessment: Generalized weakness    Cervical / Trunk Assessment Cervical / Trunk Assessment: Other exceptions;Normal Cervical / Trunk Exceptions: noted marked edema of the trunk and legs  Communication   Communication: No difficulties  Cognition Arousal/Alertness: Awake/alert Behavior During Therapy: WFL for tasks assessed/performed Overall Cognitive Status: No family/caregiver present to determine baseline cognitive functioning  General Comments: basically oriented to place and reason and that she has been in rehab.      General Comments      Exercises     Assessment/Plan    PT Assessment Patient needs continued PT services  PT Problem List Decreased strength;Decreased knowledge of use of DME;Decreased activity tolerance;Decreased mobility       PT Treatment Interventions DME instruction;Gait training;Functional mobility training;Therapeutic activities;Therapeutic exercise;Patient/family education    PT Goals (Current goals can be found in the Care Plan section)  Acute Rehab PT Goals Patient Stated Goal: to do exercises and walk PT Goal Formulation:  With patient Time For Goal Achievement: 06/10/20 Potential to Achieve Goals: Fair    Frequency Min 2X/week   Barriers to discharge        Co-evaluation               AM-PAC PT "6 Clicks" Mobility  Outcome Measure Help needed turning from your back to your side while in a flat bed without using bedrails?: A Lot Help needed moving from lying on your back to sitting on the side of a flat bed without using bedrails?: A Lot Help needed moving to and from a bed to a chair (including a wheelchair)?: A Lot Help needed standing up from a chair using your arms (e.g., wheelchair or bedside chair)?: A Lot Help needed to walk in hospital room?: Total Help needed climbing 3-5 steps with a railing? : Total 6 Click Score: 10    End of Session   Activity Tolerance: Patient tolerated treatment well Patient left: in chair (to go to radiation) Nurse Communication: Mobility status (blood clot in chair) PT Visit Diagnosis: Difficulty in walking, not elsewhere classified (R26.2);Unsteadiness on feet (R26.81)    Time: 4098-1191 PT Time Calculation (min) (ACUTE ONLY): 11 min   Charges:   PT Evaluation $PT Eval Low Complexity: [place] [facility] Acute Rehabilitation Services Pager [phone] Office [phone]   Claretha Cooper 05/27/2020, 1:59 PM

## 2020-05-27 NOTE — Progress Notes (Signed)
[facility] Therapy Treatment Record Phone [phone]   Radiation Therapy was administered to Vanessa [name] on: 05/27/2020  11:02 AM and was treatment # 2 out of a planned course of 10 treatments.  Radiation Treatment  1). Beam photons with 6-10 energy  2). Brachytherapy None  3). Stereotactic Radiosurgery None  4). Other Radiation None     Sarajane Fambrough A Orvan Papadakis, RT (T)

## 2020-05-27 NOTE — Progress Notes (Signed)
PROGRESS NOTE    Vanessa [name]  PTW:[id number] DOB: 1955/12/24 DOA: 05/24/2020 PCP: Pcp, No   Brief Narrative:  HPI per Dr. Bonnell Public on 05/24/20 Vanessa [name] is an 64 y.o. female with PMH significant for recent diagnosis of endometrial CA, HTN, CKD 3, diet-controlled diabetes and recent admission for lower extremity cellulitis who was sent over from her rehab facility for significant increase in her vaginal bleeding.  Patient apparently has chronic vaginal bleeding however over the past couple of days apparently she has been discharging a lot of clots and much more bleeding than usual.  Patient herself is unaware of this as she spends most of her day in bed and wears a diaper.  She was alarmed however when she saw how much blood there was in her diaper earlier today.  She agreed with staff at the rehab facility that she needed to come to the ED.  Patient admits to feeling somewhat weak.  She notes "I do not feel good but I do not feel as bad as I could".  She denies any chest pain.  She does admit to having some shortness of breath earlier today however that improved with sitting up.  She denies general orthopnea or PND usually.  Patient admits she does not exert herself very much.  Denies any dizziness while lying in bed.  Patient specifically denies history of congestive heart failure or pulmonary edema.  Denies known history of CAD or MI.  Echocardiogram done March 2021 shows EF of 50 to 60% with grade 2 diastolic dysfunction.  She does have moderately elevated PA pressures with peak pressure of 57.  ED Course:  The patient was noted to be afebrile and normotensive.  Laboratory work-up was notable for hemoglobin of 6, down from 8.1 on 04/30/2020.  She has known CKD and her creatinine is at baseline at 2.5.  Patient was discussed with GYN in the ED, they recommended admission to hospitalist with comanagement by them.  They will manage her vaginal bleeding.  Patient was started on  transfusion 2 unit PRBC.  **Interim History  She was Admitted with symptomatic anemia, vaginal bleeding due to endometrial cancer.  Gynecology oncology consulted as well as radiation oncology.  Patient was transfused 4 units total as her hemoglobin dropped again to 6.8 yesterday (2 units given).  She was transferred to [facility] for radiation therapy. Today is Day 2/10 of Radiation Treatments  Assessment & Plan:   Principal Problem:   Abnormal uterine bleeding due to primary malignant neoplasm of endometrium (HCC) Active Problems:   DM type 2 (diabetes mellitus, type 2) (HCC)   Dyslipidemia   Symptomatic anemia   Benign hypertension with CKD (chronic kidney disease) stage III (HCC)   Endometrial cancer (HCC)   Iron deficiency anemia due to chronic blood loss   Edema  Symptomatic anemia/ Acute on chronic normocytic anemia/ Vaginal bleeding secondary to endometrial cancer -Patient presented with shortness of breath and hemoglobin of 6 (baseline 7-8) -Was transfused 2 units PRBC on admission. -Hemoglobin/Hct yesterday was 6.8/21.3, transfusing 2 additional units; Repeat CBC this AM was 8.2/25.3 -Continue to Hold Aspirin -Continue iron supplementation with Ferrous Sulfate 325 mg po BID -Patient has an IUD however tells me that she was unable to make it to her chemo/radiation treatments -Gyn Onc consulted and appreciated and recommended radiation oncology consultation -Radiation oncology recommended 10 radiation treatments and transfer to [facility]; Today is Day 2/10 -Continue to monitor CBC  Hemorrhoids -We will continue [facility]  suppositories -There is some concern that hemorrhoids may be contributing to bleeding -We will need to follow this closely, will ask nurses to assess when they change her.  Abnormal EKG Regular narrow complex bradycardia read as sinus by EKG machine  P wave and T waves are diffusely flattened so is difficult for me to confirm that QTC is elevated at 507,  will hold Levaquin This is without any change from previous EKG from last month.  Chronic Kidney Disease, Stage IV -BUN/Creatinine appears to be at baseline and actually improved -BUN/Cr went frm 37/2.72 -> 35/2.48 -Avoid further Nephrotoxic Medications, Contrast Dyes, Hypotension and Renally adjust Medications -Repeat CMP in the AM   Essential HTN -Patient is actually hypertensive despite ongoing vaginal bleeding -We will Continue Amlodipine 10, Conidine 0.2 mg twice daily, Carvedilol 6.25 mg po BID and Hydralazine 100 mg q8h per home doses -We will also continue Furosemide 60 mg po BID and Metolazone 5 given patient is to receive 2 units of blood and she has significant lower extremity edema -BP was 163/64 this AM -May need IV Hydralazine  -Continue to Monitor BP per protocol   Diet Controlled DM2 -Patient does not appear to be on any antidiabetic medicine -Last HbA1c was 5.9 on 04/22/20 -Will place patient on carb controlled diet and check fingersticks  -SSI not ordered -Continue to monitor blood sugars carefully; CBG's ranging from 109-141 -Blood sugars ranging from 125-141 on daily BMPs  Stage II sacral ulcer -Present on Admission -Wound care consulted  Deconditioning -PT consulted for further evaluation   Hypokalemia -Patient's K+ this AM was 3.2 -Replete with po KCl 30 mEQ BID x2 -Check Mag Level in the AM -Continue to Monitor and and Replete as Necessary -Repeat CMP in the AM   Levaquin use -Patient was started on a 7-day course of Levaquin 05/22/2020, unclear indication -Given her prolonged QTC, medication held -Currently no evidence of pneumonia or urinary tract infection  Super Morbid Obesity -Complicates overall prognosis and care -Estimated body mass index is 52.51 kg/m as calculated from the following:   Height as of this encounter: 4\' 11"  (1.499 m).   Weight as of this encounter: 117.9 kg. -Weight Loss and Dietary Counseling given    DVT  prophylaxis: SCDs Code Status: FULL CODE  Family Communication: No family present at bedside  Disposition Plan: Pending further clinical improvement in her Vaginal Bleeding and Clearance by Gyn-Onc  Status is: Inpatient  Remains inpatient appropriate because:Ongoing diagnostic testing needed not appropriate for outpatient work up, Unsafe d/c plan, IV treatments appropriate due to intensity of illness or inability to take PO and Inpatient level of care appropriate due to severity of illness   Dispo: The patient is from: Home              Anticipated d/c is to: TBD              Anticipated d/c date is: 2-3 days              Patient currently is not medically stable to d/c.  Consultants:   Gyn-Oncology   Radiation-Oncology    Procedures: Radiation   Antimicrobials:  Anti-infectives (From admission, onward)   Start     Dose/Rate Route Frequency Ordered Stop   05/25/20 1800  levofloxacin (LEVAQUIN) tablet 750 mg  Status:  Discontinued        750 mg Oral Every 48 hours 05/24/20 1549 05/24/20 1610        Subjective: Seen and Examined at  bedside and was still bleeding. Felt a little better after receiving blood. No CP or SOB. Still has LE edema. No other concerns or complaints at this time.   Objective: Vitals:   05/26/20 1452 05/26/20 1840 05/26/20 2244 05/27/20 0440  BP: (!) 149/65 (!) 151/70 (!) 147/67 (!) 163/64  Pulse: 66 66 63 63  Resp: 16 14 18 18   Temp: 98.2 F (36.8 C) 97.8 F (36.6 C) (!) 97.4 F (36.3 C) (!) 97.4 F (36.3 C)  TempSrc: Oral Oral Oral Oral  SpO2: 95% 94% 97% 100%  Weight:      Height:        Intake/Output Summary (Last 24 hours) at 05/27/2020 1215 Last data filed at 05/27/2020 1000 Gross per 24 hour  Intake 460 ml  Output 5225 ml  Net -4765 ml   Filed Weights   05/24/20 1245  Weight: 117.9 kg   Examination: Physical Exam:  Constitutional: WN/WD super morbidly obese AAF in NAD and appears calm and comfortable Eyes: Lids and  conjunctivae normal, sclerae anicteric  ENMT: External Ears, Nose appear normal. Grossly normal hearing. Neck: Appears normal, supple, no cervical masses, normal ROM, no appreciable thyromegaly; no JVD Respiratory: Diminished to auscultation bilaterally, no wheezing, rales, rhonchi or crackles. Normal respiratory effort and patient is not tachypenic. No accessory muscle use.  Cardiovascular: RRR, no murmurs / rubs / gallops. S1 and S2 auscultated. Has 1-2+ LE Edema Abdomen: Soft, non-tender, Distended. Bowel sounds positive.  GU: Deferred. Musculoskeletal: No clubbing / cyanosis of digits/nails. No joint deformity upper and lower extremities.  Skin: No rashes, lesions, ulcers. No induration; Warm and dry.  Neurologic: CN 2-12 grossly intact with no focal deficits. Romberg sign and cerebellar reflexes not assessed.  Psychiatric: Normal judgment and insight. Alert and oriented x 3. Normal mood and appropriate affect.   Data Reviewed: I have personally reviewed following labs and imaging studies  CBC: Recent Labs  Lab 05/24/20 1340 05/25/20 0308 05/26/20 0045 05/27/20 0605  WBC 5.2 4.6 5.7 6.7  HGB 6.0* 7.9* 6.8* 8.2*  HCT 20.7* 25.2* 21.3* 25.3*  MCV 96.7 94.7 93.0 93.4  PLT 183 165 172 397   Basic Metabolic Panel: Recent Labs  Lab 05/24/20 1340 05/25/20 0308 05/26/20 0045 05/27/20 0605  NA 145 145 144 141  K 3.5 3.6 3.4* 3.2*  CL 110 109 106 102  CO2 25 27 28  32  GLUCOSE 113* 129* 141* 125*  BUN 38* 40* 37* 35*  CREATININE 2.55* 2.58* 2.72* 2.48*  CALCIUM 8.0* 8.0* 8.1* 8.1*   GFR: Estimated Creatinine Clearance: 26.1 mL/min (A) (by C-G formula based on SCr of 2.48 mg/dL (H)). Liver Function Tests: No results for input(s): AST, ALT, ALKPHOS, BILITOT, PROT, ALBUMIN in the last 168 hours. No results for input(s): LIPASE, AMYLASE in the last 168 hours. No results for input(s): AMMONIA in the last 168 hours. Coagulation Profile: No results for input(s): INR, PROTIME in  the last 168 hours. Cardiac Enzymes: No results for input(s): CKTOTAL, CKMB, CKMBINDEX, TROPONINI in the last 168 hours. BNP (last 3 results) No results for input(s): PROBNP in the last 8760 hours. HbA1C: No results for input(s): HGBA1C in the last 72 hours. CBG: Recent Labs  Lab 05/25/20 0611 05/25/20 1903 05/26/20 0747 05/26/20 1728 05/27/20 0748  GLUCAP 109* 134* 141* 125* 132*   Lipid Profile: No results for input(s): CHOL, HDL, LDLCALC, TRIG, CHOLHDL, LDLDIRECT in the last 72 hours. Thyroid Function Tests: No results for input(s): TSH, T4TOTAL, FREET4, T3FREE, THYROIDAB in  the last 72 hours. Anemia Panel: No results for input(s): VITAMINB12, FOLATE, FERRITIN, TIBC, IRON, RETICCTPCT in the last 72 hours. Sepsis Labs: No results for input(s): PROCALCITON, LATICACIDVEN in the last 168 hours.  Recent Results (from the past 240 hour(s))  Respiratory Panel by RT PCR (Flu A&B, Covid) - Nasopharyngeal Swab     Status: None   Collection Time: 05/24/20  3:38 PM   Specimen: Nasopharyngeal Swab  Result Value Ref Range Status   SARS Coronavirus 2 by RT PCR NEGATIVE NEGATIVE Final    Comment: (NOTE) SARS-CoV-2 target nucleic acids are NOT DETECTED.  The SARS-CoV-2 RNA is generally detectable in upper respiratoy specimens during the acute phase of infection. The lowest concentration of SARS-CoV-2 viral copies this assay can detect is 131 copies/mL. A negative result does not preclude SARS-Cov-2 infection and should not be used as the sole basis for treatment or other patient management decisions. A negative result may occur with  improper specimen collection/handling, submission of specimen other than nasopharyngeal swab, presence of viral mutation(s) within the areas targeted by this assay, and inadequate number of viral copies (<131 copies/mL). A negative result must be combined with clinical observations, patient history, and epidemiological information. The expected result is  Negative.  Fact Sheet for Patients:  PinkCheek.be  Fact Sheet for Healthcare Providers:  GravelBags.it  This test is no t yet approved or cleared by the [place] FDA and  has been authorized for detection and/or diagnosis of SARS-CoV-2 by FDA under an Emergency Use Authorization (EUA). This EUA will remain  in effect (meaning this test can be used) for the duration of the COVID-19 declaration under Section 564(b)(1) of the Act, 21 U.S.C. section 360bbb-3(b)(1), unless the authorization is terminated or revoked sooner.     Influenza A by PCR NEGATIVE NEGATIVE Final   Influenza B by PCR NEGATIVE NEGATIVE Final    Comment: (NOTE) The Xpert Xpress SARS-CoV-2/FLU/RSV assay is intended as an aid in  the diagnosis of influenza from Nasopharyngeal swab specimens and  should not be used as a sole basis for treatment. Nasal washings and  aspirates are unacceptable for Xpert Xpress SARS-CoV-2/FLU/RSV  testing.  Fact Sheet for Patients: PinkCheek.be  Fact Sheet for Healthcare Providers: GravelBags.it  This test is not yet approved or cleared by the [place] FDA and  has been authorized for detection and/or diagnosis of SARS-CoV-2 by  FDA under an Emergency Use Authorization (EUA). This EUA will remain  in effect (meaning this test can be used) for the duration of the  Covid-19 declaration under Section 564(b)(1) of the Act, 21  U.S.C. section 360bbb-3(b)(1), unless the authorization is  terminated or revoked. Performed at [facility], [place] [address] [place], [place] [address]      RN Pressure Injury Documentation:     Estimated body mass index is 52.51 kg/m as calculated from the following:   Height as of this encounter: 4\' 11"  (1.499 m).   Weight as of this encounter: 117.9 kg.  Malnutrition Type:   Malnutrition Characteristics:   Nutrition  Interventions:    Radiology Studies: No results found.  Scheduled Meds: . amLODipine  10 mg Oral Daily  . carvedilol  6.25 mg Oral BID WC  . Chlorhexidine Gluconate Cloth  6 each Topical Daily  . cloNIDine  0.2 mg Oral BID WC  . diphenhydrAMINE  25 mg Oral TID  . ferrous sulfate  325 mg Oral BID WC  . furosemide  60 mg Oral BID  .  hydrALAZINE  100 mg Oral Q8H  . hydrocerin  1 application Topical Daily  . hydrocortisone  25 mg Rectal Daily  . metolazone  5 mg Oral Daily  . nutrition supplement (JUVEN)  1 packet Oral BID BM  . polyethylene glycol  17 g Oral Daily  . potassium chloride  30 mEq Oral BID  . sodium bicarbonate  1,300 mg Oral BID WC   Continuous Infusions:   LOS: 2 days   Kerney Elbe, DO [facility] PAGER is on AMION  If 7PM-7AM, please contact night-coverage [email or web address]

## 2020-05-28 ENCOUNTER — Ambulatory Visit
Admit: 2020-05-28 | Discharge: 2020-05-28 | Disposition: A | Discharge status: Home / Self Care | Payer: Medicare Other | Attending: Radiation Oncology | Admitting: Radiation Oncology

## 2020-05-28 DIAGNOSIS — N939 Abnormal uterine and vaginal bleeding, unspecified: Secondary | ICD-10-CM | POA: Diagnosis not present

## 2020-05-28 DIAGNOSIS — E1122 Type 2 diabetes mellitus with diabetic chronic kidney disease: Secondary | ICD-10-CM | POA: Diagnosis not present

## 2020-05-28 DIAGNOSIS — I129 Hypertensive chronic kidney disease with stage 1 through stage 4 chronic kidney disease, or unspecified chronic kidney disease: Secondary | ICD-10-CM | POA: Diagnosis not present

## 2020-05-28 DIAGNOSIS — R609 Edema, unspecified: Secondary | ICD-10-CM | POA: Diagnosis not present

## 2020-05-28 LAB — COMPREHENSIVE METABOLIC PANEL
ALT: 8 U/L (ref 0–44)
AST: 14 U/L — ABNORMAL LOW (ref 15–41)
Albumin: 2.4 g/dL — ABNORMAL LOW (ref 3.5–5.0)
Alkaline Phosphatase: 82 U/L (ref 38–126)
Anion gap: 13 (ref 5–15)
BUN: 38 mg/dL — ABNORMAL HIGH (ref 8–23)
CO2: 29 mmol/L (ref 22–32)
Calcium: 8 mg/dL — ABNORMAL LOW (ref 8.9–10.3)
Chloride: 99 mmol/L (ref 98–111)
Creatinine, Ser: 2.35 mg/dL — ABNORMAL HIGH (ref 0.44–1.00)
GFR, Estimated: 22 mL/min — ABNORMAL LOW (ref >60)
Glucose, Bld: 125 mg/dL — ABNORMAL HIGH (ref 70–99)
Potassium: 3.8 mmol/L (ref 3.5–5.1)
Sodium: 141 mmol/L (ref 135–145)
Total Bilirubin: 0.5 mg/dL (ref 0.3–1.2)
Total Protein: 6 g/dL — ABNORMAL LOW (ref 6.5–8.1)

## 2020-05-28 LAB — CBC WITH DIFFERENTIAL/PLATELET
Abs Immature Granulocytes: 0.01 10*3/uL (ref 0.00–0.07)
Basophils Absolute: 0 10*3/uL (ref 0.0–0.1)
Basophils Relative: 1 %
Eosinophils Absolute: 0.2 10*3/uL (ref 0.0–0.5)
Eosinophils Relative: 4 %
HCT: 24.4 % — ABNORMAL LOW (ref 36.0–46.0)
Hemoglobin: 7.7 g/dL — ABNORMAL LOW (ref 12.0–15.0)
Immature Granulocytes: 0 %
Lymphocytes Relative: 12 %
Lymphs Abs: 0.7 10*3/uL (ref 0.7–4.0)
MCH: 30 pg (ref 26.0–34.0)
MCHC: 31.6 g/dL (ref 30.0–36.0)
MCV: 94.9 fL (ref 80.0–100.0)
Monocytes Absolute: 0.5 10*3/uL (ref 0.1–1.0)
Monocytes Relative: 9 %
Neutro Abs: 4.4 10*3/uL (ref 1.7–7.7)
Neutrophils Relative %: 74 %
Platelets: 177 10*3/uL (ref 150–400)
RBC: 2.57 MIL/uL — ABNORMAL LOW (ref 3.87–5.11)
RDW: 16.9 % — ABNORMAL HIGH (ref 11.5–15.5)
WBC: 5.9 10*3/uL (ref 4.0–10.5)
nRBC: 0.3 % — ABNORMAL HIGH (ref 0.0–0.2)

## 2020-05-28 LAB — GLUCOSE, CAPILLARY
Glucose-Capillary: 115 mg/dL — ABNORMAL HIGH (ref 70–99)
Glucose-Capillary: 142 mg/dL — ABNORMAL HIGH (ref 70–99)
Glucose-Capillary: 153 mg/dL — ABNORMAL HIGH (ref 70–99)
Glucose-Capillary: 140 mg/dL — ABNORMAL HIGH (ref 70–99)

## 2020-05-28 LAB — MAGNESIUM: Magnesium: 2 mg/dL (ref 1.7–2.4)

## 2020-05-28 LAB — PHOSPHORUS: Phosphorus: 4.2 mg/dL (ref 2.5–4.6)

## 2020-05-28 NOTE — Care Management Important Message (Signed)
Important Message  Patient Details IM Letter given to the Patient. Name: Vanessa [name] MRN: [id number] Date of Birth: March 04, 1956   Medicare Important Message Given:  Yes     Kerin Salen 05/28/2020, 10:28 AM

## 2020-05-28 NOTE — Progress Notes (Signed)
PROGRESS NOTE    Vanessa [name]  GGY:[id number] DOB: 1956-04-18 DOA: 05/24/2020 PCP: Pcp, No   Brief Narrative:  HPI per Dr. Bonnell Public on 05/24/20 Vanessa [name] is an 64 y.o. female with PMH significant for recent diagnosis of endometrial CA, HTN, CKD 3, diet-controlled diabetes and recent admission for lower extremity cellulitis who was sent over from her rehab facility for significant increase in her vaginal bleeding.  Patient apparently has chronic vaginal bleeding however over the past couple of days apparently she has been discharging a lot of clots and much more bleeding than usual.  Patient herself is unaware of this as she spends most of her day in bed and wears a diaper.  She was alarmed however when she saw how much blood there was in her diaper earlier today.  She agreed with staff at the rehab facility that she needed to come to the ED.  Patient admits to feeling somewhat weak.  She notes "I do not feel good but I do not feel as bad as I could".  She denies any chest pain.  She does admit to having some shortness of breath earlier today however that improved with sitting up.  She denies general orthopnea or PND usually.  Patient admits she does not exert herself very much.  Denies any dizziness while lying in bed.  Patient specifically denies history of congestive heart failure or pulmonary edema.  Denies known history of CAD or MI.  Echocardiogram done March 2021 shows EF of 50 to 60% with grade 2 diastolic dysfunction.  She does have moderately elevated PA pressures with peak pressure of 57.  ED Course:  The patient was noted to be afebrile and normotensive.  Laboratory work-up was notable for hemoglobin of 6, down from 8.1 on 04/30/2020.  She has known CKD and her creatinine is at baseline at 2.5.  Patient was discussed with GYN in the ED, they recommended admission to hospitalist with comanagement by them.  They will manage her vaginal bleeding.  Patient was started on  transfusion 2 unit PRBC.  **Interim History  She was Admitted with symptomatic anemia, vaginal bleeding due to endometrial cancer.  Gynecology oncology consulted as well as radiation oncology.  Patient was transfused 4 units total as her hemoglobin dropped again to 6.8 the day before yesterday (2 units given).  She was transferred to [facility] for radiation therapy. Today is Day 3/10 of Radiation Treatments and she thinks that her Vaginal Bleeding is slowing down some. Her Hbg is now 7.7.   Assessment & Plan:   Principal Problem:   Abnormal uterine bleeding due to primary malignant neoplasm of endometrium (HCC) Active Problems:   DM type 2 (diabetes mellitus, type 2) (HCC)   Dyslipidemia   Symptomatic anemia   Benign hypertension with CKD (chronic kidney disease) stage III (HCC)   Endometrial cancer (HCC)   Iron deficiency anemia due to chronic blood loss   Edema  Symptomatic anemia/ Acute on chronic normocytic anemia/ Vaginal bleeding secondary to endometrial cancer -Patient presented with shortness of breath and hemoglobin of 6 (baseline 7-8) -Was transfused 2 units PRBC on admission. -Hemoglobin/Hct 05/26/20 was 6.8/21.3 and she was transfused 2 additional units; Repeat CBC yesterday AM was 8.2/25.3 and it is now 7.7/24.4 -Continue to Hold Aspirin -Continue iron supplementation with Ferrous Sulfate 325 mg po BID -Patient has an IUD however tells me that she was unable to make it to her chemo/radiation treatments -Gyn Onc consulted and appreciated and recommended  radiation oncology consultation -Radiation oncology recommended 10 radiation treatments and transfer to [facility]; Today is Day 4/10 -Continue to monitor CBC carefully   Hemorrhoids -We will continue HC suppositories -There is some concern that hemorrhoids may be contributing to bleeding -We will need to follow this closely, will ask nurses to assess when they change her.  Abnormal EKG Regular narrow complex  bradycardia read as sinus by EKG machine  P wave and T waves are diffusely flattened so is difficult for me to confirm that QTC is elevated at 507, will hold Levaquin This is without any change from previous EKG from last month.  Chronic Kidney Disease, Stage IV, improving -BUN/Creatinine appears to be at baseline and actually improved -BUN/Cr went frm 37/2.72 -> 35/2.48 -> 38/2.35 -Avoid further Nephrotoxic Medications, Contrast Dyes, Hypotension and Renally adjust Medications -Repeat CMP in the AM   Essential HTN -Patient is actually hypertensive despite ongoing vaginal bleeding -We will Continue Amlodipine 10, Conidine 0.2 mg twice daily, Carvedilol 6.25 mg po BID and Hydralazine 100 mg q8h per home doses -We will also continue Furosemide 60 mg po BID and Metolazone 5 given patient is to receive 2 units of blood and she has significant lower extremity edema -BP was improved to 134/64 this AM -May need IV Hydralazine  -Continue to Monitor BP per protocol   Diet Controlled DM2 -Patient does not appear to be on any antidiabetic medicine -Last HbA1c was 5.9 on 04/22/20 -Will place patient on carb controlled diet and check fingersticks  -SSI not ordered -Continue to monitor blood sugars carefully; CBG's ranging from 115-148 -Blood sugars ranging from 125-141 on daily BMPs  Stage II sacral ulcer -Present on Admission -Wound care consulted  Deconditioning -PT consulted for further evaluation and recommending SNF  Hypokalemia -Patient's K+ yesterday AM was 3.2 and is now 3.8 -Check Mag Level and was 2.0 -Continue to Monitor and and Replete as Necessary -Repeat CMP in the AM   Levaquin use -Patient was started on a 7-day course of Levaquin 05/22/2020, unclear indication -Given her prolonged QTC, medication held -Currently no evidence of pneumonia or urinary tract infection  Super Morbid Obesity -Complicates overall prognosis and care -Estimated body mass index is 52.51 kg/m  as calculated from the following:   Height as of this encounter: 4\' 11"  (1.499 m).   Weight as of this encounter: 117.9 kg. -Weight Loss and Dietary Counseling given   DVT prophylaxis: SCDs given Vaginal Bleeding  Code Status: FULL CODE  Family Communication: No family present at bedside  Disposition Plan: Pending further clinical improvement in her Vaginal Bleeding and Clearance by Gyn-Onc  Status is: Inpatient  Remains inpatient appropriate because:Ongoing diagnostic testing needed not appropriate for outpatient work up, Unsafe d/c plan, IV treatments appropriate due to intensity of illness or inability to take PO and Inpatient level of care appropriate due to severity of illness   Dispo: The patient is from: Home              Anticipated d/c is to: SNF              Anticipated d/c date is: 2-3 days              Patient currently is not medically stable to d/c.  Consultants:   Gyn-Oncology   Radiation-Oncology    Procedures: Radiation   Antimicrobials:  Anti-infectives (From admission, onward)   Start     Dose/Rate Route Frequency Ordered Stop   05/25/20 1800  levofloxacin (LEVAQUIN)  tablet 750 mg  Status:  Discontinued        750 mg Oral Every 48 hours 05/24/20 1549 05/24/20 1610        Subjective: Seen and Examined at bedside and thinks she was doing ok today and thinks Vaginal bleeding is improving. No Nausea or vomiting. Denies any CP or SOB. No other concerns or complaints at this time.   Objective: Vitals:   05/27/20 0440 05/27/20 1404 05/27/20 1953 05/28/20 0608  BP: (!) 163/64 135/61 (!) 125/54 134/64  Pulse: 63 64 64 64  Resp: 18 18 14 16   Temp: (!) 97.4 F (36.3 C) 97.7 F (36.5 C) 97.7 F (36.5 C) 97.6 F (36.4 C)  TempSrc: Oral Oral Oral Oral  SpO2: 100% 100% 95% 96%  Weight:      Height:        Intake/Output Summary (Last 24 hours) at 05/28/2020 1433 Last data filed at 05/28/2020 1313 Gross per 24 hour  Intake 240 ml  Output 3525 ml  Net  -3285 ml   Filed Weights   05/24/20 1245  Weight: 117.9 kg   Examination: Physical Exam:  Constitutional: WN/WD super morbidly obese AAF in NAD and appears calm and comfortable Eyes: Lids and conjunctivae normal, sclerae anicteric  ENMT: External Ears, Nose appear normal. Grossly normal hearing. Neck: Appears normal, supple, no cervical masses, normal ROM, no appreciable thyromegaly; no JVD Respiratory: Diminished to auscultation bilaterally, no wheezing, rales, rhonchi or crackles. Normal respiratory effort and patient is not tachypenic. No accessory muscle use.  Cardiovascular: RRR, no murmurs / rubs / gallops. S1 and S2 auscultated. 1+ LE Edema  Abdomen: Soft, non-tender, Distended 2/2 body habitus. Bowel sounds positive.  GU: Deferred. Musculoskeletal: No clubbing / cyanosis of digits/nails. No joint deformity upper and lower extremities.  Skin: Has LE skin lesions and keloids noted. No induration; Warm and dry.  Neurologic: CN 2-12 grossly intact with no focal deficits. Romberg sign and cerebellar reflexes not assessed.  Psychiatric: Normal judgment and insight. Alert and oriented x 3. Normal mood and appropriate affect.   Data Reviewed: I have personally reviewed following labs and imaging studies  CBC: Recent Labs  Lab 05/24/20 1340 05/25/20 0308 05/26/20 0045 05/27/20 0605 05/28/20 0552  WBC 5.2 4.6 5.7 6.7 5.9  NEUTROABS  --   --   --   --  4.4  HGB 6.0* 7.9* 6.8* 8.2* 7.7*  HCT 20.7* 25.2* 21.3* 25.3* 24.4*  MCV 96.7 94.7 93.0 93.4 94.9  PLT 183 165 172 173 144   Basic Metabolic Panel: Recent Labs  Lab 05/24/20 1340 05/25/20 0308 05/26/20 0045 05/27/20 0605 05/28/20 0552  NA 145 145 144 141 141  K 3.5 3.6 3.4* 3.2* 3.8  CL 110 109 106 102 99  CO2 25 27 28  32 29  GLUCOSE 113* 129* 141* 125* 125*  BUN 38* 40* 37* 35* 38*  CREATININE 2.55* 2.58* 2.72* 2.48* 2.35*  CALCIUM 8.0* 8.0* 8.1* 8.1* 8.0*  MG  --   --   --   --  2.0  PHOS  --   --   --   --  4.2     GFR: Estimated Creatinine Clearance: 27.5 mL/min (A) (by C-G formula based on SCr of 2.35 mg/dL (H)). Liver Function Tests: Recent Labs  Lab 05/28/20 0552  AST 14*  ALT 8  ALKPHOS 82  BILITOT 0.5  PROT 6.0*  ALBUMIN 2.4*   No results for input(s): LIPASE, AMYLASE in the last 168 hours. No  results for input(s): AMMONIA in the last 168 hours. Coagulation Profile: No results for input(s): INR, PROTIME in the last 168 hours. Cardiac Enzymes: No results for input(s): CKTOTAL, CKMB, CKMBINDEX, TROPONINI in the last 168 hours. BNP (last 3 results) No results for input(s): PROBNP in the last 8760 hours. HbA1C: No results for input(s): HGBA1C in the last 72 hours. CBG: Recent Labs  Lab 05/26/20 1728 05/27/20 0748 05/27/20 1156 05/27/20 1632 05/28/20 0714  GLUCAP 125* 132* 148* 148* 115*   Lipid Profile: No results for input(s): CHOL, HDL, LDLCALC, TRIG, CHOLHDL, LDLDIRECT in the last 72 hours. Thyroid Function Tests: No results for input(s): TSH, T4TOTAL, FREET4, T3FREE, THYROIDAB in the last 72 hours. Anemia Panel: No results for input(s): VITAMINB12, FOLATE, FERRITIN, TIBC, IRON, RETICCTPCT in the last 72 hours. Sepsis Labs: No results for input(s): PROCALCITON, LATICACIDVEN in the last 168 hours.  Recent Results (from the past 240 hour(s))  Respiratory Panel by RT PCR (Flu A&B, Covid) - Nasopharyngeal Swab     Status: None   Collection Time: 05/24/20  3:38 PM   Specimen: Nasopharyngeal Swab  Result Value Ref Range Status   SARS Coronavirus 2 by RT PCR NEGATIVE NEGATIVE Final    Comment: (NOTE) SARS-CoV-2 target nucleic acids are NOT DETECTED.  The SARS-CoV-2 RNA is generally detectable in upper respiratoy specimens during the acute phase of infection. The lowest concentration of SARS-CoV-2 viral copies this assay can detect is 131 copies/mL. A negative result does not preclude SARS-Cov-2 infection and should not be used as the sole basis for treatment or other  patient management decisions. A negative result may occur with  improper specimen collection/handling, submission of specimen other than nasopharyngeal swab, presence of viral mutation(s) within the areas targeted by this assay, and inadequate number of viral copies (<131 copies/mL). A negative result must be combined with clinical observations, patient history, and epidemiological information. The expected result is Negative.  Fact Sheet for Patients:  PinkCheek.be  Fact Sheet for Healthcare Providers:  GravelBags.it  This test is no t yet approved or cleared by the [place] FDA and  has been authorized for detection and/or diagnosis of SARS-CoV-2 by FDA under an Emergency Use Authorization (EUA). This EUA will remain  in effect (meaning this test can be used) for the duration of the COVID-19 declaration under Section 564(b)(1) of the Act, 21 U.S.C. section 360bbb-3(b)(1), unless the authorization is terminated or revoked sooner.     Influenza A by PCR NEGATIVE NEGATIVE Final   Influenza B by PCR NEGATIVE NEGATIVE Final    Comment: (NOTE) The Xpert Xpress SARS-CoV-2/FLU/RSV assay is intended as an aid in  the diagnosis of influenza from Nasopharyngeal swab specimens and  should not be used as a sole basis for treatment. Nasal washings and  aspirates are unacceptable for Xpert Xpress SARS-CoV-2/FLU/RSV  testing.  Fact Sheet for Patients: PinkCheek.be  Fact Sheet for Healthcare Providers: GravelBags.it  This test is not yet approved or cleared by the [place] FDA and  has been authorized for detection and/or diagnosis of SARS-CoV-2 by  FDA under an Emergency Use Authorization (EUA). This EUA will remain  in effect (meaning this test can be used) for the duration of the  Covid-19 declaration under Section 564(b)(1) of the Act, 21  U.S.C. section  360bbb-3(b)(1), unless the authorization is  terminated or revoked. Performed at [facility], [place] [address], [place], [place] [address]      RN Pressure Injury Documentation:  Estimated body mass index is 52.51 kg/m as calculated from the following:   Height as of this encounter: 4\' 11"  (1.499 m).   Weight as of this encounter: 117.9 kg.  Malnutrition Type:   Malnutrition Characteristics:   Nutrition Interventions:    Radiology Studies: No results found.  Scheduled Meds:  amLODipine  10 mg Oral Daily   carvedilol  6.25 mg Oral BID WC   Chlorhexidine Gluconate Cloth  6 each Topical Daily   cloNIDine  0.2 mg Oral BID WC   diphenhydrAMINE  25 mg Oral TID   ferrous sulfate  325 mg Oral BID WC   furosemide  60 mg Oral BID   hydrALAZINE  100 mg Oral Q8H   hydrocerin  1 application Topical Daily   hydrocortisone  25 mg Rectal Daily   metolazone  5 mg Oral Daily   nutrition supplement (JUVEN)  1 packet Oral BID BM   polyethylene glycol  17 g Oral Daily   sodium bicarbonate  1,300 mg Oral BID WC   Continuous Infusions:   LOS: 3 days   Kerney Elbe, DO [facility] PAGER is on AMION  If 7PM-7AM, please contact night-coverage [email or web address]

## 2020-05-28 NOTE — Progress Notes (Signed)
[facility] Therapy Treatment Record Phone [phone]   Radiation Therapy was administered to Vanessa [name] on: 05/28/2020  11:00 AM and was treatment # 3 out of a planned course of 10 treatments.  Radiation Treatment  1). Beam photons with 6-10 energy  2). Brachytherapy None  3). Stereotactic Radiosurgery None  4). Other Radiation None     Monty Spicher A Yorley Buch, RT (T)

## 2020-05-29 ENCOUNTER — Ambulatory Visit
Admit: 2020-05-29 | Discharge: 2020-05-29 | Disposition: A | Discharge status: Home / Self Care | Payer: Medicare Other | Attending: Radiation Oncology | Admitting: Radiation Oncology

## 2020-05-29 DIAGNOSIS — E1122 Type 2 diabetes mellitus with diabetic chronic kidney disease: Secondary | ICD-10-CM | POA: Diagnosis not present

## 2020-05-29 DIAGNOSIS — N939 Abnormal uterine and vaginal bleeding, unspecified: Secondary | ICD-10-CM | POA: Diagnosis not present

## 2020-05-29 DIAGNOSIS — R609 Edema, unspecified: Secondary | ICD-10-CM | POA: Diagnosis not present

## 2020-05-29 DIAGNOSIS — I129 Hypertensive chronic kidney disease with stage 1 through stage 4 chronic kidney disease, or unspecified chronic kidney disease: Secondary | ICD-10-CM | POA: Diagnosis not present

## 2020-05-29 LAB — COMPREHENSIVE METABOLIC PANEL
ALT: 8 U/L (ref 0–44)
AST: 17 U/L (ref 15–41)
Albumin: 2.5 g/dL — ABNORMAL LOW (ref 3.5–5.0)
Alkaline Phosphatase: 90 U/L (ref 38–126)
Anion gap: 10 (ref 5–15)
BUN: 42 mg/dL — ABNORMAL HIGH (ref 8–23)
CO2: 32 mmol/L (ref 22–32)
Calcium: 8.3 mg/dL — ABNORMAL LOW (ref 8.9–10.3)
Chloride: 99 mmol/L (ref 98–111)
Creatinine, Ser: 2.47 mg/dL — ABNORMAL HIGH (ref 0.44–1.00)
GFR, Estimated: 21 mL/min — ABNORMAL LOW (ref >60)
Glucose, Bld: 120 mg/dL — ABNORMAL HIGH (ref 70–99)
Potassium: 3.7 mmol/L (ref 3.5–5.1)
Sodium: 141 mmol/L (ref 135–145)
Total Bilirubin: 0.7 mg/dL (ref 0.3–1.2)
Total Protein: 6.1 g/dL — ABNORMAL LOW (ref 6.5–8.1)

## 2020-05-29 LAB — CBC WITH DIFFERENTIAL/PLATELET
Abs Immature Granulocytes: 0.11 10*3/uL — ABNORMAL HIGH (ref 0.00–0.07)
Basophils Absolute: 0.1 10*3/uL (ref 0.0–0.1)
Basophils Relative: 1 %
Eosinophils Absolute: 0.2 10*3/uL (ref 0.0–0.5)
Eosinophils Relative: 3 %
HCT: 21.8 % — ABNORMAL LOW (ref 36.0–46.0)
Hemoglobin: 7.1 g/dL — ABNORMAL LOW (ref 12.0–15.0)
Immature Granulocytes: 2 %
Lymphocytes Relative: 13 %
Lymphs Abs: 0.7 10*3/uL (ref 0.7–4.0)
MCH: 30 pg (ref 26.0–34.0)
MCHC: 32.6 g/dL (ref 30.0–36.0)
MCV: 92 fL (ref 80.0–100.0)
Monocytes Absolute: 0.4 10*3/uL (ref 0.1–1.0)
Monocytes Relative: 6 %
Neutro Abs: 4.1 10*3/uL (ref 1.7–7.7)
Neutrophils Relative %: 75 %
Platelets: 203 10*3/uL (ref 150–400)
RBC: 2.37 MIL/uL — ABNORMAL LOW (ref 3.87–5.11)
RDW: 16.6 % — ABNORMAL HIGH (ref 11.5–15.5)
WBC: 5.5 10*3/uL (ref 4.0–10.5)
nRBC: 0 % (ref 0.0–0.2)

## 2020-05-29 LAB — GLUCOSE, CAPILLARY
Glucose-Capillary: 132 mg/dL — ABNORMAL HIGH (ref 70–99)
Glucose-Capillary: 118 mg/dL — ABNORMAL HIGH (ref 70–99)
Glucose-Capillary: 123 mg/dL — ABNORMAL HIGH (ref 70–99)
Glucose-Capillary: 142 mg/dL — ABNORMAL HIGH (ref 70–99)

## 2020-05-29 LAB — PREPARE RBC (CROSSMATCH)

## 2020-05-29 LAB — PHOSPHORUS: Phosphorus: 4.4 mg/dL (ref 2.5–4.6)

## 2020-05-29 LAB — MAGNESIUM: Magnesium: 2 mg/dL (ref 1.7–2.4)

## 2020-05-29 MED ORDER — DIPHENHYDRAMINE-ZINC ACETATE 2-0.1 % EX CREA
TOPICAL_CREAM | Freq: Two times a day (BID) | CUTANEOUS | Status: DC | PRN
  Administered 2020-05-31: 1 via TOPICAL
  Filled 2020-05-29 (×3): qty 28

## 2020-05-29 MED ORDER — SODIUM CHLORIDE 0.9% IV SOLUTION: Freq: Once | INTRAVENOUS | Status: DC

## 2020-05-29 NOTE — TOC Progression Note (Addendum)
Transition of Care [facility]) - Progression Note    Patient Details  Name: Vanessa [name] MRN: [id number] Date of Birth: [birth year]  Transition [facility]) CM/SW Contact  Mavi Un, Marjie Skiff, RN Phone Number: 05/29/2020, 1:07 PM  Clinical Narrative:    Insurance auth started yesterday for SNF (ref [id number]). [place] called today to offer a Peer to Peer with attending MD. MD made aware. Number for Peer to Peer [phone], option 5.  TOC will continue to follow.  Spoke with pt at bedside for dc planning. Concern was expressed to pt that insurance might not pay for her to go to SNF again. Pt states "I will just have to go home". Pt says she lives with her husband but that he has cancer too and isn't able to help her much. He does still drive but their car is in the shop. Attempt was made to call pt husband and voicemail left. Pt could potentially qualify for Medicaid. I will call financial counselors to see if pt can be screened while in hospital. Pt does have 3 children with one daughter living locally. None of their phone numbers listed on the chart and the pt does not have a cell phone or know their numbers.  Addendum 1550:  [name] has denied SNF insurance auth. Still awaiting call from husband.  Expected Discharge Plan: Skilled Nursing Facility Barriers to Discharge: SNF Pending bed offer, Continued Medical Work up  Expected Discharge Plan and Services Expected Discharge Plan: [place] arrangements for the past 2 months: [place], Apartment                 Readmission Risk Interventions Readmission Risk Prevention Plan 11/08/2019 10/21/2019 09/25/2019  Transportation Screening Complete Complete Complete  PCP or Specialist Appt within 3-5 Days - - Not Complete  Not Complete comments - - plan for SNF  [place] or [place] - - Complete  Social Work Consult for [place] Planning/Counseling - - Complete  Palliative Care Screening - -  Not Applicable  Medication Review Press photographer) Complete Referral to Pharmacy Referral to Pharmacy  PCP or Specialist appointment within 3-5 days of discharge Complete Complete -  [place] or Home Care Consult Complete (No Data) -  SW Recovery Care/Counseling Consult Complete Complete -  Palliative Care Screening Not Applicable Not Applicable -  Skilled Nursing Facility Complete Complete -  Some recent data might be hidden

## 2020-05-29 NOTE — Progress Notes (Signed)
Physical Therapy Treatment Patient Details Name: Vanessa [name] MRN: [id number] DOB: August 08, 1955 Today's Date: 05/29/2020    History of Present Illness Vanessa [name] is an 64 y.o. female with PMH significant for recent diagnosis of endometrial CA, HTN, CKD 3, diet-controlled diabetes and recent admission for lower extremity cellulitis who was sent over from her rehab facility for significant increase in her vaginal bleeding    PT Comments    Pt noted to have low hgb, but denies fatigue or SOB, only reporting cold and agreeable to exercises. Pt performs seated therapeutic exercise with cues for form and muscle activation, noted to have increased difficulty with LLE performing in smaller AROM. Pt states her L leg is always weaker than her R. Pt educated on STS technique to use BUE to assist in powering up and quad/glute activation with 3 reps and mod assist from therapist. Ambulation not attempted per pt request due to being cold. Pt will benefit from continued physical therapy in hospital and recommendations below to increase strength, balance, endurance for safe ADLs and gait.    Follow Up Recommendations  SNF;Supervision/Assistance - 24 hour     Equipment Recommendations  None recommended by PT    Recommendations for Other Services       Precautions / Restrictions Precautions Precautions: Fall Restrictions Weight Bearing Restrictions: No    Mobility  Bed Mobility  General bed mobility comments: in chair upon arrival  Transfers Overall transfer level: Needs assistance Equipment used: Rolling walker (2 wheeled) Transfers: Sit to/from Stand Sit to Stand: Mod assist    General transfer comment: mod A to power up with rocking momentum, cued for pushing through bil hands on armrests and through feet to activate quads/glutes  Ambulation/Gait  General Gait Details: not attempted   Stairs             Wheelchair Mobility    Modified Rankin (Stroke Patients Only)        Balance Overall balance assessment: Needs assistance  Standing balance support: During functional activity;Bilateral upper extremity supported Standing balance-Leahy Scale: Poor Standing balance comment: reliant on UE support       Cognition Arousal/Alertness: Awake/alert Behavior During Therapy: WFL for tasks assessed/performed Overall Cognitive Status: Within Functional Limits for tasks assessed  General Comments: pt slightly slow to respond to cues, but pleasant and oriented to self, place and situation      Exercises General Exercises - Lower Extremity Long Arc Quad: Seated;AROM;Strengthening;Both;15 reps Hip Flexion/Marching: Seated;AROM;Strengthening;Both;15 reps Toe Raises: Seated;AROM;Strengthening;Both;15 reps Heel Raises: Seated;AROM;Strengthening;Both;15 reps Other Exercises Other Exercises: STS, 3 reps from chair with RW, BUE assisting to power up    General Comments        Pertinent Vitals/Pain Pain Assessment: No/denies pain    Home Living                      Prior Function            PT Goals (current goals can now be found in the care plan section) Acute Rehab PT Goals Patient Stated Goal: to do exercises and walk PT Goal Formulation: With patient Time For Goal Achievement: 06/10/20 Potential to Achieve Goals: Fair Progress towards PT goals: Progressing toward goals    Frequency    Min 2X/week      PT Plan Current plan remains appropriate    Co-evaluation              AM-PAC PT "6 Clicks" Mobility   Outcome Measure  Help needed turning from your back to your side while in a flat bed without using bedrails?: A Lot Help needed moving from lying on your back to sitting on the side of a flat bed without using bedrails?: A Lot Help needed moving to and from a bed to a chair (including a wheelchair)?: A Lot Help needed standing up from a chair using your arms (e.g., wheelchair or bedside chair)?: A Lot Help needed to  walk in hospital room?: Total Help needed climbing 3-5 steps with a railing? : Total 6 Click Score: 10    End of Session   Activity Tolerance: Patient tolerated treatment well Patient left: in chair;with call bell/phone within reach;with chair alarm set Nurse Communication: Mobility status PT Visit Diagnosis: Difficulty in walking, not elsewhere classified (R26.2);Unsteadiness on feet (R26.81)     Time: 9458-5929 PT Time Calculation (min) (ACUTE ONLY): 15 min  Charges:  $Therapeutic Exercise: 8-22 mins                      Vanessa [name] PT, DPT 05/29/20, 11:00 AM

## 2020-05-29 NOTE — Progress Notes (Signed)
PROGRESS NOTE    Vanessa [name]  GLO:[id number] DOB: 21-Oct-1955 DOA: 05/24/2020 PCP: Pcp, No   Brief Narrative:  HPI per Dr. Bonnell Public on 05/24/20 Vanessa [name] is an 64 y.o. female with PMH significant for recent diagnosis of endometrial CA, HTN, CKD 3, diet-controlled diabetes and recent admission for lower extremity cellulitis who was sent over from her rehab facility for significant increase in her vaginal bleeding.  Patient apparently has chronic vaginal bleeding however over the past couple of days apparently she has been discharging a lot of clots and much more bleeding than usual.  Patient herself is unaware of this as she spends most of her day in bed and wears a diaper.  She was alarmed however when she saw how much blood there was in her diaper earlier today.  She agreed with staff at the rehab facility that she needed to come to the ED.  Patient admits to feeling somewhat weak.  She notes "I do not feel good but I do not feel as bad as I could".  She denies any chest pain.  She does admit to having some shortness of breath earlier today however that improved with sitting up.  She denies general orthopnea or PND usually.  Patient admits she does not exert herself very much.  Denies any dizziness while lying in bed.  Patient specifically denies history of congestive heart failure or pulmonary edema.  Denies known history of CAD or MI.  Echocardiogram done March 2021 shows EF of 50 to 60% with grade 2 diastolic dysfunction.  She does have moderately elevated PA pressures with peak pressure of 57.  ED Course:  The patient was noted to be afebrile and normotensive.  Laboratory work-up was notable for hemoglobin of 6, down from 8.1 on 04/30/2020.  She has known CKD and her creatinine is at baseline at 2.5.  Patient was discussed with GYN in the ED, they recommended admission to hospitalist with comanagement by them.  They will manage her vaginal bleeding.  Patient was started on  transfusion 2 unit PRBC.  **Interim History  She was Admitted with symptomatic anemia, vaginal bleeding due to endometrial cancer.  Gynecology oncology consulted as well as radiation oncology.  Patient was transfused 4 units total as her hemoglobin dropped again to 6.8 the day before yesterday (2 units given).  She was transferred to [facility] for radiation therapy. Today is Day 4/10 of Radiation Treatments and she thinks that her Vaginal Bleeding is slowing down some but still persistent and Hgb dropped to 7.1 so will transfuse 1 more pRBCs.  Unfortunately her insurance has denied authorization for SNF and when I called for peer-to-peer it would not let me connect to anybody live and I did not get to discuss the case with anyone today.  Assessment & Plan:   Principal Problem:   Abnormal uterine bleeding due to primary malignant neoplasm of endometrium (HCC) Active Problems:   DM type 2 (diabetes mellitus, type 2) (HCC)   Dyslipidemia   Symptomatic anemia   Benign hypertension with CKD (chronic kidney disease) stage III (HCC)   Endometrial cancer (HCC)   Iron deficiency anemia due to chronic blood loss   Edema  Symptomatic anemia/ Acute on chronic normocytic anemia/ Vaginal bleeding secondary to endometrial cancer -Patient presented with shortness of breath and hemoglobin of 6 (baseline 7-8) -Was transfused 2 units PRBC on admission. -Hemoglobin/Hct 05/26/20 was 6.8/21.3 and she was transfused 2 additional units; Repeat CBC yesterday AM was 8.2/25.3 ->  7.7/24.4 -> 7.1/21.8; Will transfuse 1 more unit of pRBCs and it will make 5 units total so far -Continue to Hold Aspirin -Continue iron supplementation with Ferrous Sulfate 325 mg po BID -Patient has an IUD however tells me that she was unable to make it to her chemo/radiation treatments -Gyn Onc consulted and appreciated and recommended radiation oncology consultation -Radiation oncology recommended 10 radiation treatments and transfer  to [facility]; Today is Day 4/10 -Continue to monitor CBC carefully   Hemorrhoids -We will continue HC suppositories -There is some concern that hemorrhoids may be contributing to bleeding -We will need to follow this closely, will ask nurses to assess when they change her.  Abnormal EKG Regular narrow complex bradycardia read as sinus by EKG machine  P wave and T waves are diffusely flattened so is difficult for me to confirm that QTC is elevated at 507, will hold Levaquin This is without any change from previous EKG from last month.  Chronic Kidney Disease, Stage IV, stable -BUN/Creatinine appears to be at baseline and actually improved -BUN/Cr went frm 37/2.72 -> 35/2.48 -> 38/2.35 and today is 42/2.47 -Avoid further Nephrotoxic Medications, Contrast Dyes, Hypotension and Renally adjust Medications -Repeat CMP in the AM   Essential HTN -Patient is actually hypertensive despite ongoing vaginal bleeding -We will Continue Amlodipine 10, Conidine 0.2 mg twice daily, Carvedilol 6.25 mg po BID and Hydralazine 100 mg q8h per home doses -We will also continue Furosemide 60 mg po BID and Metolazone 5 given patient is to receive 2 units of blood and she has significant lower extremity edema -BP was elevated at 169/74 this AM -May need IV Hydralazine  -Continue to Monitor BP per protocol   Diet Controlled DM2 -Patient does not appear to be on any antidiabetic medicine -Last HbA1c was 5.9 on 04/22/20 -Will place patient on carb controlled diet and check fingersticks  -SSI not ordered -Continue to monitor blood sugars carefully; CBG's ranging from 118-153 -Blood sugars ranging from 125-141 on daily BMPs  Stage II sacral ulcer -Present on Admission -Wound care consulted  Deconditioning -PT consulted for further evaluation and recommending SNF but unfortunately she was denied Authorization and When I called for the Peer-to-Peer I was not connected to anyone live and was on hold for  20 minutes  Hypokalemia -Patient's K+ is now 3.7 -Check Mag Level and was 2.0 -Continue to Monitor and and Replete as Necessary -Repeat CMP in the AM   Levaquin use -Patient was started on a 7-day course of Levaquin 05/22/2020, unclear indication -Given her prolonged QTC, medication held -Currently no evidence of pneumonia or urinary tract infection  Super Morbid Obesity -Complicates overall prognosis and care -Estimated body mass index is 52.51 kg/m as calculated from the following:   Height as of this encounter: 4\' 11"  (1.499 m).   Weight as of this encounter: 117.9 kg. -Weight Loss and Dietary Counseling given   DVT prophylaxis: SCDs given Vaginal Bleeding  Code Status: FULL CODE  Family Communication: No family present at bedside  Disposition Plan: Pending further clinical improvement in her Vaginal Bleeding and Clearance by Gyn-Onc  Status is: Inpatient  Remains inpatient appropriate because:Ongoing diagnostic testing needed not appropriate for outpatient work up, Unsafe d/c plan, IV treatments appropriate due to intensity of illness or inability to take PO and Inpatient level of care appropriate due to severity of illness   Dispo: The patient is from: Home              Anticipated  d/c is to: SNF              Anticipated d/c date is: 2-3 days              Patient currently is not medically stable to d/c.  Consultants:   Gyn-Oncology   Radiation-Oncology    Procedures: Radiation   Antimicrobials:  Anti-infectives (From admission, onward)   Start     Dose/Rate Route Frequency Ordered Stop   05/25/20 1800  levofloxacin (LEVAQUIN) tablet 750 mg  Status:  Discontinued        750 mg Oral Every 48 hours 05/24/20 1549 05/24/20 1610        Subjective: Seen and Examined at bedside and today she states that she is not doing the best but doing not doing the worst.  States that she is very fatigued today.  No nausea or vomiting.  Continues to have some vaginal bleeding  and thinks it still persists but not as bad as what it was when she first came in.  Denies any other concerns or complaints at this time.  Objective: Vitals:   05/29/20 0528 05/29/20 0534 05/29/20 0550 05/29/20 1535  BP: (!) 149/66 (!) 149/66  (!) 188/74  Pulse:  66 65 80  Resp:   16 16  Temp:   97.8 F (36.6 C) 98 F (36.7 C)  TempSrc:   Oral Oral  SpO2:   100% 96%  Weight:      Height:        Intake/Output Summary (Last 24 hours) at 05/29/2020 1600 Last data filed at 05/29/2020 1226 Gross per 24 hour  Intake --  Output 2550 ml  Net -2550 ml   Filed Weights   05/24/20 1245  Weight: 117.9 kg   Examination: Physical Exam:  Constitutional: WN/WD super morbidly obese African-American female currently no acute distress sitting in the chair bedside appears calm Eyes: Lids and conjunctivae normal, sclerae anicteric  ENMT: External Ears, Nose appear normal. Grossly normal hearing.  Neck: Appears normal, supple, no cervical masses, normal ROM, no appreciable thyromegaly neck: No JVD Respiratory: Diminished to auscultation bilaterally, no wheezing, rales, rhonchi or crackles. Normal respiratory effort and patient is not tachypenic. No accessory muscle use.  Cardiovascular: RRR, no murmurs / rubs / gallops. S1 and S2 auscultated.  Has 1+ extremity edema with some skin changes Abdomen: Soft, non-tender, distended secondary to body habitus.  Bowel sounds positive.  GU: Deferred. Musculoskeletal: No clubbing / cyanosis of digits/nails. No joint deformity upper and lower extremities. . Skin: Has lower extremity skin changes that are slightly pruritic. No induration; Warm and dry.  Neurologic: CN 2-12 grossly intact with no focal deficits. Romberg sign cerebellar reflexes not assessed.  Psychiatric: Normal judgment and insight. Alert and oriented x 3. Normal mood and appropriate affect.   Data Reviewed: I have personally reviewed following labs and imaging studies  CBC: Recent Labs   Lab 05/25/20 0308 05/26/20 0045 05/27/20 0605 05/28/20 0552 05/29/20 0733  WBC 4.6 5.7 6.7 5.9 5.5  NEUTROABS  --   --   --  4.4 4.1  HGB 7.9* 6.8* 8.2* 7.7* 7.1*  HCT 25.2* 21.3* 25.3* 24.4* 21.8*  MCV 94.7 93.0 93.4 94.9 92.0  PLT 165 172 173 177 073   Basic Metabolic Panel: Recent Labs  Lab 05/25/20 0308 05/26/20 0045 05/27/20 0605 05/28/20 0552 05/29/20 0733  NA 145 144 141 141 141  K 3.6 3.4* 3.2* 3.8 3.7  CL 109 106 102 99 99  CO2  27 28 32 29 32  GLUCOSE 129* 141* 125* 125* 120*  BUN 40* 37* 35* 38* 42*  CREATININE 2.58* 2.72* 2.48* 2.35* 2.47*  CALCIUM 8.0* 8.1* 8.1* 8.0* 8.3*  MG  --   --   --  2.0 2.0  PHOS  --   --   --  4.2 4.4   GFR: Estimated Creatinine Clearance: 26.2 mL/min (A) (by C-G formula based on SCr of 2.47 mg/dL (H)). Liver Function Tests: Recent Labs  Lab 05/28/20 0552 05/29/20 0733  AST 14* 17  ALT 8 8  ALKPHOS 82 90  BILITOT 0.5 0.7  PROT 6.0* 6.1*  ALBUMIN 2.4* 2.5*   No results for input(s): LIPASE, AMYLASE in the last 168 hours. No results for input(s): AMMONIA in the last 168 hours. Coagulation Profile: No results for input(s): INR, PROTIME in the last 168 hours. Cardiac Enzymes: No results for input(s): CKTOTAL, CKMB, CKMBINDEX, TROPONINI in the last 168 hours. BNP (last 3 results) No results for input(s): PROBNP in the last 8760 hours. HbA1C: No results for input(s): HGBA1C in the last 72 hours. CBG: Recent Labs  Lab 05/28/20 1152 05/28/20 1652 05/28/20 2223 05/29/20 0724 05/29/20 1211  GLUCAP 142* 153* 140* 132* 118*   Lipid Profile: No results for input(s): CHOL, HDL, LDLCALC, TRIG, CHOLHDL, LDLDIRECT in the last 72 hours. Thyroid Function Tests: No results for input(s): TSH, T4TOTAL, FREET4, T3FREE, THYROIDAB in the last 72 hours. Anemia Panel: No results for input(s): VITAMINB12, FOLATE, FERRITIN, TIBC, IRON, RETICCTPCT in the last 72 hours. Sepsis Labs: No results for input(s): PROCALCITON, LATICACIDVEN in  the last 168 hours.  Recent Results (from the past 240 hour(s))  Respiratory Panel by RT PCR (Flu A&B, Covid) - Nasopharyngeal Swab     Status: None   Collection Time: 05/24/20  3:38 PM   Specimen: Nasopharyngeal Swab  Result Value Ref Range Status   SARS Coronavirus 2 by RT PCR NEGATIVE NEGATIVE Final    Comment: (NOTE) SARS-CoV-2 target nucleic acids are NOT DETECTED.  The SARS-CoV-2 RNA is generally detectable in upper respiratoy specimens during the acute phase of infection. The lowest concentration of SARS-CoV-2 viral copies this assay can detect is 131 copies/mL. A negative result does not preclude SARS-Cov-2 infection and should not be used as the sole basis for treatment or other patient management decisions. A negative result may occur with  improper specimen collection/handling, submission of specimen other than nasopharyngeal swab, presence of viral mutation(s) within the areas targeted by this assay, and inadequate number of viral copies (<131 copies/mL). A negative result must be combined with clinical observations, patient history, and epidemiological information. The expected result is Negative.  Fact Sheet for Patients:  PinkCheek.be  Fact Sheet for Healthcare Providers:  GravelBags.it  This test is no t yet approved or cleared by the [place] FDA and  has been authorized for detection and/or diagnosis of SARS-CoV-2 by FDA under an Emergency Use Authorization (EUA). This EUA will remain  in effect (meaning this test can be used) for the duration of the COVID-19 declaration under Section 564(b)(1) of the Act, 21 U.S.C. section 360bbb-3(b)(1), unless the authorization is terminated or revoked sooner.     Influenza A by PCR NEGATIVE NEGATIVE Final   Influenza B by PCR NEGATIVE NEGATIVE Final    Comment: (NOTE) The Xpert Xpress SARS-CoV-2/FLU/RSV assay is intended as an aid in  the diagnosis of  influenza from Nasopharyngeal swab specimens and  should not be used as a sole basis for  treatment. Nasal washings and  aspirates are unacceptable for Xpert Xpress SARS-CoV-2/FLU/RSV  testing.  Fact Sheet for Patients: PinkCheek.be  Fact Sheet for Healthcare Providers: GravelBags.it  This test is not yet approved or cleared by the [place] FDA and  has been authorized for detection and/or diagnosis of SARS-CoV-2 by  FDA under an Emergency Use Authorization (EUA). This EUA will remain  in effect (meaning this test can be used) for the duration of the  Covid-19 declaration under Section 564(b)(1) of the Act, 21  U.S.C. section 360bbb-3(b)(1), unless the authorization is  terminated or revoked. Performed at [facility], [place] [address], [place], [place] [address]      RN Pressure Injury Documentation:     Estimated body mass index is 52.51 kg/m as calculated from the following:   Height as of this encounter: 4\' 11"  (1.499 m).   Weight as of this encounter: 117.9 kg.  Malnutrition Type:   Malnutrition Characteristics:   Nutrition Interventions:    Radiology Studies: No results found.  Scheduled Meds:  sodium chloride   Intravenous Once   amLODipine  10 mg Oral Daily   carvedilol  6.25 mg Oral BID WC   Chlorhexidine Gluconate Cloth  6 each Topical Daily   cloNIDine  0.2 mg Oral BID WC   diphenhydrAMINE  25 mg Oral TID   ferrous sulfate  325 mg Oral BID WC   furosemide  60 mg Oral BID   hydrALAZINE  100 mg Oral Q8H   hydrocerin  1 application Topical Daily   hydrocortisone  25 mg Rectal Daily   metolazone  5 mg Oral Daily   nutrition supplement (JUVEN)  1 packet Oral BID BM   polyethylene glycol  17 g Oral Daily   sodium bicarbonate  1,300 mg Oral BID WC   Continuous Infusions:   LOS: 4 days   Kerney Elbe, DO [facility] PAGER is on AMION  If 7PM-7AM, please  contact night-coverage [email or web address]

## 2020-05-30 DIAGNOSIS — E1122 Type 2 diabetes mellitus with diabetic chronic kidney disease: Secondary | ICD-10-CM | POA: Diagnosis not present

## 2020-05-30 DIAGNOSIS — I129 Hypertensive chronic kidney disease with stage 1 through stage 4 chronic kidney disease, or unspecified chronic kidney disease: Secondary | ICD-10-CM | POA: Diagnosis not present

## 2020-05-30 DIAGNOSIS — N939 Abnormal uterine and vaginal bleeding, unspecified: Secondary | ICD-10-CM | POA: Diagnosis not present

## 2020-05-30 DIAGNOSIS — R609 Edema, unspecified: Secondary | ICD-10-CM | POA: Diagnosis not present

## 2020-05-30 LAB — CBC WITH DIFFERENTIAL/PLATELET
Abs Immature Granulocytes: 0.04 10*3/uL (ref 0.00–0.07)
Basophils Absolute: 0 10*3/uL (ref 0.0–0.1)
Basophils Relative: 1 %
Eosinophils Absolute: 0.2 10*3/uL (ref 0.0–0.5)
Eosinophils Relative: 3 %
HCT: 24.1 % — ABNORMAL LOW (ref 36.0–46.0)
Hemoglobin: 7.8 g/dL — ABNORMAL LOW (ref 12.0–15.0)
Immature Granulocytes: 1 %
Lymphocytes Relative: 13 %
Lymphs Abs: 0.6 10*3/uL — ABNORMAL LOW (ref 0.7–4.0)
MCH: 29.8 pg (ref 26.0–34.0)
MCHC: 32.4 g/dL (ref 30.0–36.0)
MCV: 92 fL (ref 80.0–100.0)
Monocytes Absolute: 0.3 10*3/uL (ref 0.1–1.0)
Monocytes Relative: 7 %
Neutro Abs: 3.7 10*3/uL (ref 1.7–7.7)
Neutrophils Relative %: 75 %
Platelets: 194 10*3/uL (ref 150–400)
RBC: 2.62 MIL/uL — ABNORMAL LOW (ref 3.87–5.11)
RDW: 15.9 % — ABNORMAL HIGH (ref 11.5–15.5)
WBC: 4.9 10*3/uL (ref 4.0–10.5)
nRBC: 0 % (ref 0.0–0.2)

## 2020-05-30 LAB — COMPREHENSIVE METABOLIC PANEL
ALT: 10 U/L (ref 0–44)
AST: 18 U/L (ref 15–41)
Albumin: 2.6 g/dL — ABNORMAL LOW (ref 3.5–5.0)
Alkaline Phosphatase: 91 U/L (ref 38–126)
Anion gap: 11 (ref 5–15)
BUN: 46 mg/dL — ABNORMAL HIGH (ref 8–23)
CO2: 33 mmol/L — ABNORMAL HIGH (ref 22–32)
Calcium: 8.3 mg/dL — ABNORMAL LOW (ref 8.9–10.3)
Chloride: 97 mmol/L — ABNORMAL LOW (ref 98–111)
Creatinine, Ser: 2.22 mg/dL — ABNORMAL HIGH (ref 0.44–1.00)
GFR, Estimated: 24 mL/min — ABNORMAL LOW (ref >60)
Glucose, Bld: 105 mg/dL — ABNORMAL HIGH (ref 70–99)
Potassium: 3.5 mmol/L (ref 3.5–5.1)
Sodium: 141 mmol/L (ref 135–145)
Total Bilirubin: 0.8 mg/dL (ref 0.3–1.2)
Total Protein: 6.2 g/dL — ABNORMAL LOW (ref 6.5–8.1)

## 2020-05-30 LAB — TYPE AND SCREEN
ABO/RH(D): O POS
Antibody Screen: NEGATIVE
Unit division: 0

## 2020-05-30 LAB — PHOSPHORUS: Phosphorus: 4.2 mg/dL (ref 2.5–4.6)

## 2020-05-30 LAB — BPAM RBC
Blood Product Expiration Date: 202112202359
ISSUE DATE / TIME: 202111191543
Unit Type and Rh: 5100

## 2020-05-30 LAB — GLUCOSE, CAPILLARY
Glucose-Capillary: 103 mg/dL — ABNORMAL HIGH (ref 70–99)
Glucose-Capillary: 160 mg/dL — ABNORMAL HIGH (ref 70–99)
Glucose-Capillary: 166 mg/dL — ABNORMAL HIGH (ref 70–99)

## 2020-05-30 LAB — MAGNESIUM: Magnesium: 1.9 mg/dL (ref 1.7–2.4)

## 2020-05-30 MED ORDER — POTASSIUM CHLORIDE CRYS ER 20 MEQ PO TBCR
40.0000 meq | EXTENDED_RELEASE_TABLET | Freq: Once | ORAL | Status: AC
  Administered 2020-05-30: 40 meq via ORAL
  Filled 2020-05-30: qty 2

## 2020-05-30 NOTE — Progress Notes (Signed)
PROGRESS NOTE    Vanessa [name]  CHE:[id number] DOB: 1956-02-05 DOA: 05/24/2020 PCP: Pcp, No   Brief Narrative:  HPI per Dr. Bonnell Public on 05/24/20 Vanessa [name] is an 64 y.o. female with PMH significant for recent diagnosis of endometrial CA, HTN, CKD 3, diet-controlled diabetes and recent admission for lower extremity cellulitis who was sent over from her rehab facility for significant increase in her vaginal bleeding.  Patient apparently has chronic vaginal bleeding however over the past couple of days apparently she has been discharging a lot of clots and much more bleeding than usual.  Patient herself is unaware of this as she spends most of her day in bed and wears a diaper.  She was alarmed however when she saw how much blood there was in her diaper earlier today.  She agreed with staff at the rehab facility that she needed to come to the ED.  Patient admits to feeling somewhat weak.  She notes "I do not feel good but I do not feel as bad as I could".  She denies any chest pain.  She does admit to having some shortness of breath earlier today however that improved with sitting up.  She denies general orthopnea or PND usually.  Patient admits she does not exert herself very much.  Denies any dizziness while lying in bed.  Patient specifically denies history of congestive heart failure or pulmonary edema.  Denies known history of CAD or MI.  Echocardiogram done March 2021 shows EF of 50 to 60% with grade 2 diastolic dysfunction.  She does have moderately elevated PA pressures with peak pressure of 57.  ED Course:  The patient was noted to be afebrile and normotensive.  Laboratory work-up was notable for hemoglobin of 6, down from 8.1 on 04/30/2020.  She has known CKD and her creatinine is at baseline at 2.5.  Patient was discussed with GYN in the ED, they recommended admission to hospitalist with comanagement by them.  They will manage her vaginal bleeding.  Patient was started on  transfusion 2 unit PRBC.  **Interim History  She was Admitted with symptomatic anemia, vaginal bleeding due to endometrial cancer.  Gynecology oncology consulted as well as radiation oncology.  Patient was transfused 4 units total as her hemoglobin dropped again to 6.8 the day before yesterday (2 units given).  She was transferred to [facility] for radiation therapy. Today is Day 4/10 of Radiation Treatments and she thinks that her Vaginal Bleeding is slowing down some but still persistent and Hgb dropped to 7.1 so will transfuse 1 more pRBCs.  Unfortunately her insurance has denied authorization for SNF and when I called for peer-to-peer it would not let me connect to anybody live and I did not get to discuss the case with anyone today.  05/30/2020 Her renal function is slightly improved compared to yesterday but blood count is only minimally improved from the 1 unit of PRBCs.  We will continue monitor if she continues to have some bleeding but she states this is slow down.  CBGs ranging from 103-1 60.  Overall she is feeling a little bit better today but her Foley came out today  Assessment & Plan:   Principal Problem:   Abnormal uterine bleeding due to primary malignant neoplasm of endometrium (HCC) Active Problems:   DM type 2 (diabetes mellitus, type 2) (HCC)   Dyslipidemia   Symptomatic anemia   Benign hypertension with CKD (chronic kidney disease) stage III (HCC)   Endometrial  cancer ([place])   Iron deficiency anemia due to chronic blood loss   Edema  Symptomatic anemia/ Acute on chronic normocytic anemia/ Vaginal bleeding secondary to endometrial cancer -Patient presented with shortness of breath and hemoglobin of 6 (baseline 7-8) -Was transfused 2 units PRBC on admission. -Hemoglobin/Hct 05/26/20 was 6.8/21.3 and she was transfused 2 additional units; Repeat CBC yesterday AM was 8.2/25.3 -> 7.7/24.4 -> 7.1/21.8; Will transfuse 1 more unit of pRBCs and it will make 5 units total so  far; her hemoglobin/hematocrit is now 7.8/24.1 after transfusion -Continue to Hold Aspirin -Continue iron supplementation with Ferrous Sulfate 325 mg po BID -Patient has an IUD however tells me that she was unable to make it to her chemo/radiation treatments -Gyn Onc consulted and appreciated and recommended radiation oncology consultation -Radiation oncology recommended 10 radiation treatments and transfer to [facility]; yesterday was Day 4/10 -Continue to monitor CBC carefully as it has only minimally improved from yesterday after the 1 unit PRBCs  Hemorrhoids -We will continue HC suppositories -There is some concern that hemorrhoids may be contributing to bleeding -We will need to follow this closely, will ask nurses to assess when they change her.  Abnormal EKG Regular narrow complex bradycardia read as sinus by EKG machine  P wave and T waves are diffusely flattened so is difficult for me to confirm that QTC is elevated at 507, will hold Levaquin This is without any change from previous EKG from last month.  Chronic Kidney Disease, Stage IV, stable -BUN/Creatinine appears to be at baseline and actually improved -BUN/Cr went frm 37/2.72 -> 35/2.48 -> 38/2.35 -> 42/2.47 -> and is 46/2.22 -Patient had a Foley catheter but this got dislodged and will hold off replacing for now -Avoid further Nephrotoxic Medications, Contrast Dyes, Hypotension and Renally adjust Medications -Repeat CMP in the AM   Essential HTN -Patient is actually hypertensive despite ongoing vaginal bleeding -We will Continue Amlodipine 10, Conidine 0.2 mg twice daily, Carvedilol 6.25 mg po BID and Hydralazine 100 mg q8h per home doses -We will also continue Furosemide 60 mg po BID and Metolazone 5 given patient is to receive 2 units of blood and she has significant lower extremity edema -BP was elevated at 151/69 this AM -May need IV Hydralazine  -Continue to Monitor BP per protocol   Diet Controlled  DM2 -Patient does not appear to be on any antidiabetic medicine -Last HbA1c was 5.9 on 04/22/20 -Will place patient on carb controlled diet and check fingersticks  -SSI not ordered -Continue to monitor blood sugars carefully; CBG's ranging from 103-160 -Blood sugars ranging from 105-141 on daily BMPs  Stage II Sacral Ulcer -Present on Admission -Wound care consulted  Deconditioning -PT consulted for further evaluation and recommending SNF but unfortunately she was denied Authorization and When I called for the Peer-to-Peer I was not connected to anyone live and was on hold for 20 minutes  Hypokalemia -Patient's K+ is now 3.5; Replete with po KCl 40 mQ x1 -Check Mag Level and was 2.0 -Continue to Monitor and and Replete as Necessary -Repeat CMP in the AM   Levaquin use -Patient was started on a 7-day course of Levaquin 05/22/2020, unclear indication -Given her prolonged QTC, medication held -Currently no evidence of pneumonia or urinary tract infection  Super Morbid Obesity -Complicates overall prognosis and care -Estimated body mass index is 52.51 kg/m as calculated from the following:   Height as of this encounter: 4\' 11"  (1.499 m).   Weight as of this encounter: 117.9  kg. -Weight Loss and Dietary Counseling given   DVT prophylaxis: SCDs given Vaginal Bleeding  Code Status: FULL CODE  Family Communication: No family present at bedside  Disposition Plan: Pending further clinical improvement in her Vaginal Bleeding and Clearance by Gyn-Onc  Status is: Inpatient  Remains inpatient appropriate because:Ongoing diagnostic testing needed not appropriate for outpatient work up, Unsafe d/c plan, IV treatments appropriate due to intensity of illness or inability to take PO and Inpatient level of care appropriate due to severity of illness   Dispo: The patient is from: Home              Anticipated d/c is to: SNF              Anticipated d/c date is: 2-3 days              Patient  currently is not medically stable to d/c.  Consultants:   Gyn-Oncology   Radiation-Oncology    Procedures: Radiation   Antimicrobials:  Anti-infectives (From admission, onward)   Start     Dose/Rate Route Frequency Ordered Stop   05/25/20 1800  levofloxacin (LEVAQUIN) tablet 750 mg  Status:  Discontinued        750 mg Oral Every 48 hours 05/24/20 1549 05/24/20 1610        Subjective: Seen and Examined at bedside and she states that she is doing fair today.  Continues to have some itching and states that some of her scabs are falling off.  No nausea or vomiting.  States that her bleeding is decreased.  No nausea or vomiting.  No other concerns or complaints at this time.  Objective: Vitals:   05/29/20 1954 05/30/20 0538 05/30/20 0851 05/30/20 1359  BP: (!) 149/69 (!) 132/59 (!) 171/72 (!) 151/69  Pulse: 68 65 68 65  Resp: 14 14  16   Temp: 98 F (36.7 C) 97.8 F (36.6 C)  (!) 97.5 F (36.4 C)  TempSrc: Oral Oral  Oral  SpO2: 98% 95%  100%  Weight:      Height:        Intake/Output Summary (Last 24 hours) at 05/30/2020 1418 Last data filed at 05/30/2020 1157 Gross per 24 hour  Intake 1145 ml  Output 2175 ml  Net -1030 ml   Filed Weights   05/24/20 1245  Weight: 117.9 kg   Examination: Physical Exam:  Constitutional: WN/WD super morbidly obese African-American female currently no acute distress in the chair bedside appears calm. Eyes: Lids and conjunctivae normal, sclerae anicteric  ENMT: External Ears, Nose appear normal. Grossly normal hearing.  Neck: Appears normal, supple, no cervical masses, normal ROM, no appreciable thyromegaly; no JVD Respiratory: Diminished to auscultation bilaterally, no wheezing, rales, rhonchi or crackles. Normal respiratory effort and patient is not tachypenic. No accessory muscle use.  Unlabored breathing Cardiovascular: RRR, no murmurs / rubs / gallops. S1 and S2 auscultated.  Has trace extremity edema with some skin changes and  some blisters Abdomen: Soft, non-tender, distended secondary to body habitus. Bowel sounds positive.  GU: Deferred. Musculoskeletal: No clubbing / cyanosis of digits/nails. No joint deformity upper and lower extremities.  Skin: No rashes but has some lower extremity skin changes that are slightly pruritic. No induration; Warm and dry.  Neurologic: CN 2-12 grossly intact with no focal deficits. Romberg sign and cerebellar reflexes not assessed.  Psychiatric: Normal judgment and insight. Alert and oriented x 3. Normal mood and appropriate affect.   Data Reviewed: I have personally reviewed following labs  and imaging studies  CBC: Recent Labs  Lab 05/26/20 0045 05/27/20 0605 05/28/20 0552 05/29/20 0733 05/30/20 0920  WBC 5.7 6.7 5.9 5.5 4.9  NEUTROABS  --   --  4.4 4.1 3.7  HGB 6.8* 8.2* 7.7* 7.1* 7.8*  HCT 21.3* 25.3* 24.4* 21.8* 24.1*  MCV 93.0 93.4 94.9 92.0 92.0  PLT 172 173 177 203 539   Basic Metabolic Panel: Recent Labs  Lab 05/26/20 0045 05/27/20 0605 05/28/20 0552 05/29/20 0733 05/30/20 0920  NA 144 141 141 141 141  K 3.4* 3.2* 3.8 3.7 3.5  CL 106 102 99 99 97*  CO2 28 32 29 32 33*  GLUCOSE 141* 125* 125* 120* 105*  BUN 37* 35* 38* 42* 46*  CREATININE 2.72* 2.48* 2.35* 2.47* 2.22*  CALCIUM 8.1* 8.1* 8.0* 8.3* 8.3*  MG  --   --  2.0 2.0 1.9  PHOS  --   --  4.2 4.4 4.2   GFR: Estimated Creatinine Clearance: 29.2 mL/min (A) (by C-G formula based on SCr of 2.22 mg/dL (H)). Liver Function Tests: Recent Labs  Lab 05/28/20 0552 05/29/20 0733 05/30/20 0920  AST 14* 17 18  ALT 8 8 10   ALKPHOS 82 90 91  BILITOT 0.5 0.7 0.8  PROT 6.0* 6.1* 6.2*  ALBUMIN 2.4* 2.5* 2.6*   No results for input(s): LIPASE, AMYLASE in the last 168 hours. No results for input(s): AMMONIA in the last 168 hours. Coagulation Profile: No results for input(s): INR, PROTIME in the last 168 hours. Cardiac Enzymes: No results for input(s): CKTOTAL, CKMB, CKMBINDEX, TROPONINI in the last  168 hours. BNP (last 3 results) No results for input(s): PROBNP in the last 8760 hours. HbA1C: No results for input(s): HGBA1C in the last 72 hours. CBG: Recent Labs  Lab 05/29/20 1211 05/29/20 1649 05/29/20 1957 05/30/20 0732 05/30/20 1154  GLUCAP 118* 123* 142* 103* 160*   Lipid Profile: No results for input(s): CHOL, HDL, LDLCALC, TRIG, CHOLHDL, LDLDIRECT in the last 72 hours. Thyroid Function Tests: No results for input(s): TSH, T4TOTAL, FREET4, T3FREE, THYROIDAB in the last 72 hours. Anemia Panel: No results for input(s): VITAMINB12, FOLATE, FERRITIN, TIBC, IRON, RETICCTPCT in the last 72 hours. Sepsis Labs: No results for input(s): PROCALCITON, LATICACIDVEN in the last 168 hours.  Recent Results (from the past 240 hour(s))  Respiratory Panel by RT PCR (Flu A&B, Covid) - Nasopharyngeal Swab     Status: None   Collection Time: 05/24/20  3:38 PM   Specimen: Nasopharyngeal Swab  Result Value Ref Range Status   SARS Coronavirus 2 by RT PCR NEGATIVE NEGATIVE Final    Comment: (NOTE) SARS-CoV-2 target nucleic acids are NOT DETECTED.  The SARS-CoV-2 RNA is generally detectable in upper respiratoy specimens during the acute phase of infection. The lowest concentration of SARS-CoV-2 viral copies this assay can detect is 131 copies/mL. A negative result does not preclude SARS-Cov-2 infection and should not be used as the sole basis for treatment or other patient management decisions. A negative result may occur with  improper specimen collection/handling, submission of specimen other than nasopharyngeal swab, presence of viral mutation(s) within the areas targeted by this assay, and inadequate number of viral copies (<131 copies/mL). A negative result must be combined with clinical observations, patient history, and epidemiological information. The expected result is Negative.  Fact Sheet for Patients:  PinkCheek.be  Fact Sheet for Healthcare  Providers:  GravelBags.it  This test is no t yet approved or cleared by the [place] and  has been authorized for detection and/or diagnosis of SARS-CoV-2 by FDA under an Emergency Use Authorization (EUA). This EUA will remain  in effect (meaning this test can be used) for the duration of the COVID-19 declaration under Section 564(b)(1) of the Act, 21 U.S.C. section 360bbb-3(b)(1), unless the authorization is terminated or revoked sooner.     Influenza A by PCR NEGATIVE NEGATIVE Final   Influenza B by PCR NEGATIVE NEGATIVE Final    Comment: (NOTE) The Xpert Xpress SARS-CoV-2/FLU/RSV assay is intended as an aid in  the diagnosis of influenza from Nasopharyngeal swab specimens and  should not be used as a sole basis for treatment. Nasal washings and  aspirates are unacceptable for Xpert Xpress SARS-CoV-2/FLU/RSV  testing.  Fact Sheet for Patients: PinkCheek.be  Fact Sheet for Healthcare Providers: GravelBags.it  This test is not yet approved or cleared by the [place] FDA and  has been authorized for detection and/or diagnosis of SARS-CoV-2 by  FDA under an Emergency Use Authorization (EUA). This EUA will remain  in effect (meaning this test can be used) for the duration of the  Covid-19 declaration under Section 564(b)(1) of the Act, 21  U.S.C. section 360bbb-3(b)(1), unless the authorization is  terminated or revoked. Performed at [facility], [place] [address] [place], [place] [address]      RN Pressure Injury Documentation:     Estimated body mass index is 52.51 kg/m as calculated from the following:   Height as of this encounter: 4\' 11"  (1.499 m).   Weight as of this encounter: 117.9 kg.  Malnutrition Type:   Malnutrition Characteristics:   Nutrition Interventions:    Radiology Studies: No results found.  Scheduled Meds: . sodium chloride   Intravenous  Once  . amLODipine  10 mg Oral Daily  . carvedilol  6.25 mg Oral BID WC  . Chlorhexidine Gluconate Cloth  6 each Topical Daily  . cloNIDine  0.2 mg Oral BID WC  . diphenhydrAMINE  25 mg Oral TID  . ferrous sulfate  325 mg Oral BID WC  . furosemide  60 mg Oral BID  . hydrALAZINE  100 mg Oral Q8H  . hydrocerin  1 application Topical Daily  . hydrocortisone  25 mg Rectal Daily  . metolazone  5 mg Oral Daily  . nutrition supplement (JUVEN)  1 packet Oral BID BM  . polyethylene glycol  17 g Oral Daily  . sodium bicarbonate  1,300 mg Oral BID WC   Continuous Infusions:   LOS: 5 days   Kerney Elbe, DO [facility] PAGER is on AMION  If 7PM-7AM, please contact night-coverage [email or web address]

## 2020-05-31 ENCOUNTER — Ambulatory Visit
Admit: 2020-05-31 | Discharge: 2020-05-31 | Disposition: A | Discharge status: Home / Self Care | Payer: Medicare Other | Attending: Radiation Oncology | Admitting: Radiation Oncology

## 2020-05-31 DIAGNOSIS — I129 Hypertensive chronic kidney disease with stage 1 through stage 4 chronic kidney disease, or unspecified chronic kidney disease: Secondary | ICD-10-CM | POA: Diagnosis not present

## 2020-05-31 DIAGNOSIS — R609 Edema, unspecified: Secondary | ICD-10-CM | POA: Diagnosis not present

## 2020-05-31 DIAGNOSIS — N939 Abnormal uterine and vaginal bleeding, unspecified: Secondary | ICD-10-CM | POA: Diagnosis not present

## 2020-05-31 DIAGNOSIS — E1122 Type 2 diabetes mellitus with diabetic chronic kidney disease: Secondary | ICD-10-CM | POA: Diagnosis not present

## 2020-05-31 LAB — CBC WITH DIFFERENTIAL/PLATELET
Abs Immature Granulocytes: 0.01 10*3/uL (ref 0.00–0.07)
Basophils Absolute: 0 10*3/uL (ref 0.0–0.1)
Basophils Relative: 1 %
Eosinophils Absolute: 0.2 10*3/uL (ref 0.0–0.5)
Eosinophils Relative: 4 %
HCT: 25.4 % — ABNORMAL LOW (ref 36.0–46.0)
Hemoglobin: 8 g/dL — ABNORMAL LOW (ref 12.0–15.0)
Immature Granulocytes: 0 %
Lymphocytes Relative: 15 %
Lymphs Abs: 0.6 10*3/uL — ABNORMAL LOW (ref 0.7–4.0)
MCH: 30.3 pg (ref 26.0–34.0)
MCHC: 31.5 g/dL (ref 30.0–36.0)
MCV: 96.2 fL (ref 80.0–100.0)
Monocytes Absolute: 0.4 10*3/uL (ref 0.1–1.0)
Monocytes Relative: 10 %
Neutro Abs: 2.7 10*3/uL (ref 1.7–7.7)
Neutrophils Relative %: 70 %
Platelets: 167 10*3/uL (ref 150–400)
RBC: 2.64 MIL/uL — ABNORMAL LOW (ref 3.87–5.11)
RDW: 16.1 % — ABNORMAL HIGH (ref 11.5–15.5)
WBC: 3.9 10*3/uL — ABNORMAL LOW (ref 4.0–10.5)
nRBC: 0 % (ref 0.0–0.2)

## 2020-05-31 LAB — COMPREHENSIVE METABOLIC PANEL
ALT: 9 U/L (ref 0–44)
AST: 17 U/L (ref 15–41)
Albumin: 2.5 g/dL — ABNORMAL LOW (ref 3.5–5.0)
Alkaline Phosphatase: 86 U/L (ref 38–126)
Anion gap: 13 (ref 5–15)
BUN: 48 mg/dL — ABNORMAL HIGH (ref 8–23)
CO2: 33 mmol/L — ABNORMAL HIGH (ref 22–32)
Calcium: 8.3 mg/dL — ABNORMAL LOW (ref 8.9–10.3)
Chloride: 96 mmol/L — ABNORMAL LOW (ref 98–111)
Creatinine, Ser: 2.17 mg/dL — ABNORMAL HIGH (ref 0.44–1.00)
GFR, Estimated: 25 mL/min — ABNORMAL LOW (ref >60)
Glucose, Bld: 123 mg/dL — ABNORMAL HIGH (ref 70–99)
Potassium: 3.3 mmol/L — ABNORMAL LOW (ref 3.5–5.1)
Sodium: 142 mmol/L (ref 135–145)
Total Bilirubin: 0.7 mg/dL (ref 0.3–1.2)
Total Protein: 6 g/dL — ABNORMAL LOW (ref 6.5–8.1)

## 2020-05-31 LAB — GLUCOSE, CAPILLARY
Glucose-Capillary: 112 mg/dL — ABNORMAL HIGH (ref 70–99)
Glucose-Capillary: 117 mg/dL — ABNORMAL HIGH (ref 70–99)
Glucose-Capillary: 121 mg/dL — ABNORMAL HIGH (ref 70–99)
Glucose-Capillary: 148 mg/dL — ABNORMAL HIGH (ref 70–99)

## 2020-05-31 LAB — MAGNESIUM: Magnesium: 1.9 mg/dL (ref 1.7–2.4)

## 2020-05-31 LAB — PHOSPHORUS: Phosphorus: 4.3 mg/dL (ref 2.5–4.6)

## 2020-05-31 MED ORDER — POTASSIUM CHLORIDE CRYS ER 20 MEQ PO TBCR
40.0000 meq | EXTENDED_RELEASE_TABLET | Freq: Once | ORAL | Status: AC
  Administered 2020-05-31: 40 meq via ORAL
  Filled 2020-05-31: qty 2

## 2020-05-31 MED ORDER — DIPHENHYDRAMINE HCL 25 MG PO CAPS
25.0000 mg | ORAL_CAPSULE | Freq: Once | ORAL | Status: AC
  Administered 2020-05-31: 25 mg via ORAL
  Filled 2020-05-31: qty 1

## 2020-05-31 MED ORDER — DIPHENHYDRAMINE HCL 50 MG/ML IJ SOLN
12.5000 mg | Freq: Once | INTRAMUSCULAR | Status: AC
  Administered 2020-05-31: 12.5 mg via INTRAVENOUS
  Filled 2020-05-31: qty 1

## 2020-05-31 NOTE — Evaluation (Signed)
Occupational Therapy Evaluation Patient Details Name: Vanessa [name] MRN: [id number] DOB: May 15, 1956 Today's Date: 05/31/2020    History of Present Illness Vanessa [name] is an 64 y.o. female with PMH significant for recent diagnosis of endometrial CA, HTN, CKD 3, diet-controlled diabetes and recent admission for lower extremity cellulitis who was sent over from her rehab facility for significant increase in her vaginal bleeding   Clinical Impression   Vanessa [name] is a 64 year old woman who presents with generalized weakness, decreased activity tolerance, decreased ROM and strength in LUE with mild edema throughout, impaired balance, and significant edema in bilateral lower extremities resulting in patient having significant difficulty with ambulation, bed mobility and ADLs. Patient min guard with RW for minimal ambulation, mod assist to get LEs into bed, max assist for LB dressing, bathing and toileting. Patient will benefit from skilled OT services while in hospital to improve deficits and learn compensatory strategies as needed in order to return PLOF.      Follow Up Recommendations  SNF;Home health OT    Equipment Recommendations    Adaptive Equipment    Recommendations for Other Services       Precautions / Restrictions Precautions Precautions: Fall Precaution Comments: vaginal bleeding Restrictions Weight Bearing Restrictions: No      Mobility Bed Mobility Overal bed mobility: Needs Assistance Bed Mobility: Sit to Supine       Sit to supine: Mod assist   General bed mobility comments: Mod assist for LEs, patient assistance with bed rails for transfer, +2 to pull up in bed.    Transfers Overall transfer level: Needs assistance Equipment used: Rolling walker (2 wheeled) Transfers: Sit to/from Stand Sit to Stand: Min guard Stand pivot transfers: Min guard       General transfer comment: min guard with RW for standing with transfers and ADLs    Balance  Overall balance assessment: Needs assistance Sitting-balance support: No upper extremity supported;Feet supported Sitting balance-Leahy Scale: Good     Standing balance support: During functional activity;Bilateral upper extremity supported Standing balance-Leahy Scale: Fair Standing balance comment: able to lift hands off walker for ADLs                           ADL either performed or assessed with clinical judgement   ADL Overall ADL's : Needs assistance/impaired Eating/Feeding: Set up;Sitting   Grooming: Set up;Sitting   Upper Body Bathing: Set up;Sitting   Lower Body Bathing: Maximal assistance;Sit to/from stand   Upper Body Dressing : Set up;Sitting   Lower Body Dressing: Sit to/from stand;Maximal assistance Lower Body Dressing Details (indicate cue type and reason): Able to thread RLE but not Left (unable to lift or extend knee), needs assistance for socks and shoes, able to pull clothing up Toilet Transfer: Min guard;RW;BSC;Stand-pivot   Toileting- Clothing Manipulation and Hygiene: Maximal assistance Toileting - Clothing Manipulation Details (indicate cue type and reason): Able to perform wiping in front, needs assistance with buttocks and thorough pericare     Functional mobility during ADLs: Min guard;Rolling walker       Vision Patient Visual Report: No change from baseline       Perception     Praxis      Pertinent Vitals/Pain Pain Assessment: No/denies pain     Hand Dominance Right   Extremity/Trunk Assessment Upper Extremity Assessment Upper Extremity Assessment: RUE deficits/detail;LUE deficits/detail RUE Deficits / Details: WFL ROM, shoulder 4/5, elbow 4/5, wrist 5/5, grip  4/5 RUE Sensation: WNL RUE Coordination: WNL LUE Deficits / Details: Shoulder 3-/5, elbow 4-/5, wrist 4/5, grip 3+/5, Left arm mildly edamtous throughout from upper arm to fingers, reports hx of left humeral fracture LUE Sensation: WNL LUE Coordination: WNL    Lower Extremity Assessment Lower Extremity Assessment: Defer to PT evaluation   Cervical / Trunk Assessment Cervical / Trunk Assessment: Normal Cervical / Trunk Exceptions: noted marked edema of the trunk and legs   Communication Communication Communication: No difficulties   Cognition Arousal/Alertness: Awake/alert Behavior During Therapy: WFL for tasks assessed/performed Overall Cognitive Status: Within Functional Limits for tasks assessed                                     General Comments       Exercises     Shoulder Instructions      Home Living Family/patient expects to be discharged to:: Skilled nursing facility Living Arrangements:  [facility])                               Additional Comments: from SNF, reports that she stands with Rw with assistance, may take a few steps, has needed assistance with ADLs due to weakness and continued bleeding.      Prior Functioning/Environment Level of Independence: Needs assistance  Gait / Transfers Assistance Needed: Limited mobility and use of RW at rehab per patient ADL's / Homemaking Assistance Needed: Needs assistance for all ADLs, able to feed self but needs set up/   Comments: At home: No steps to get into apartment, walk in shower with shower chair, has RW, BSC. Has a chair but not a recliner - difficulty getting in and out of bed due to LE edema.        OT Problem List: Decreased strength;Decreased range of motion;Decreased activity tolerance;Impaired balance (sitting and/or standing);Decreased knowledge of use of DME or AE;Impaired UE functional use;Obesity;Increased edema      OT Treatment/Interventions: Self-care/ADL training;Therapeutic exercise;DME and/or AE instruction;Therapeutic activities;Balance training;Patient/family education    OT Goals(Current goals can be found in the care plan section) Acute Rehab OT Goals Patient Stated Goal: To get stronger OT Goal  Formulation: With patient Time For Goal Achievement: 06/14/20 Potential to Achieve Goals: Good  OT Frequency: Min 2X/week   Barriers to D/C:            Co-evaluation              AM-PAC OT "6 Clicks" Daily Activity     Outcome Measure Help from another person eating meals?: A Little Help from another person taking care of personal grooming?: A Little Help from another person toileting, which includes using toliet, bedpan, or urinal?: A Lot Help from another person bathing (including washing, rinsing, drying)?: A Lot Help from another person to put on and taking off regular upper body clothing?: A Little Help from another person to put on and taking off regular lower body clothing?: A Lot 6 Click Score: 15   End of Session Equipment Utilized During Treatment: Rolling walker Nurse Communication: Mobility status  Activity Tolerance: Patient tolerated treatment well Patient left: in bed;with call bell/phone within reach;with bed alarm set;with nursing/sitter in room  OT Visit Diagnosis: Other abnormalities of gait and mobility (R26.89);Muscle weakness (generalized) (M62.81)  Time: 6503-5465 OT Time Calculation (min): 28 min Charges:  OT General Charges $OT Visit: 1 Visit OT Evaluation $OT Eval Moderate Complexity: 1 Mod  Renso Swett, OTR/L [place]  Office [phone] Pager: [place] 05/31/2020, 1:38 PM

## 2020-05-31 NOTE — Progress Notes (Signed)
PROGRESS NOTE    Vanessa [name]  PJA:[id number] DOB: [birth year] DOA: 05/24/2020 PCP: Pcp, No   Brief Narrative:  HPI per Dr. Bonnell Public on 05/24/20 Vanessa [name] is an 64 y.o. female with PMH significant for recent diagnosis of endometrial CA, HTN, CKD 3, diet-controlled diabetes and recent admission for lower extremity cellulitis who was sent over from her rehab facility for significant increase in her vaginal bleeding.  Patient apparently has chronic vaginal bleeding however over the past couple of days apparently she has been discharging a lot of clots and much more bleeding than usual.  Patient herself is unaware of this as she spends most of her day in bed and wears a diaper.  She was alarmed however when she saw how much blood there was in her diaper earlier today.  She agreed with staff at the rehab facility that she needed to come to the ED.  Patient admits to feeling somewhat weak.  She notes "I do not feel good but I do not feel as bad as I could".  She denies any chest pain.  She does admit to having some shortness of breath earlier today however that improved with sitting up.  She denies general orthopnea or PND usually.  Patient admits she does not exert herself very much.  Denies any dizziness while lying in bed.  Patient specifically denies history of congestive heart failure or pulmonary edema.  Denies known history of CAD or MI.  Echocardiogram done March 2021 shows EF of 50 to 60% with grade 2 diastolic dysfunction.  She does have moderately elevated PA pressures with peak pressure of 57.  ED Course:  The patient was noted to be afebrile and normotensive.  Laboratory work-up was notable for hemoglobin of 6, down from 8.1 on 04/30/2020.  She has known CKD and her creatinine is at baseline at 2.5.  Patient was discussed with GYN in the ED, they recommended admission to hospitalist with comanagement by them.  They will manage her vaginal bleeding.  Patient was started on  transfusion 2 unit PRBC.  **Interim History  She was Admitted with symptomatic anemia, vaginal bleeding due to endometrial cancer.  Gynecology oncology consulted as well as radiation oncology.  Patient was transfused 4 units total as her hemoglobin dropped again to 6.8 the day before yesterday (2 units given).  She was transferred to [facility] for radiation therapy.  She has undergone 5 radiation treatment and has been transfused a total of 5 units of PRBCs.  Unfortunately her insurance has denied authorization for SNF and when I called for peer-to-peer it would not let me connect to anybody live and I did not get to discuss the case with anyone today.  05/30/2020 Her renal function is slightly improved compared to yesterday but blood count is only minimally improved from the 1 unit of PRBCs.  We will continue monitor if she continues to have some bleeding but she states this is slow down.  CBGs ranging from 103-1 60.  Overall she is feeling a little bit better today but her Foley came out today  05/31/2020 She feels a little bit better today and not as weak.  PT OT still working with the patient.  Underwent radiation again today and this is day 5 of her treatments.  Thinks her vaginal bleeding is slowing down and Foley catheter removed yesterday.  Assessment & Plan:   Principal Problem:   Abnormal uterine bleeding due to primary malignant neoplasm of endometrium (HCC) Active  Problems:   DM type 2 (diabetes mellitus, type 2) (HCC)   Dyslipidemia   Symptomatic anemia   Benign hypertension with CKD (chronic kidney disease) stage III (HCC)   Endometrial cancer (HCC)   Iron deficiency anemia due to chronic blood loss   Edema  Symptomatic anemia/ Acute on chronic normocytic anemia/ Vaginal bleeding secondary to endometrial cancer -Patient presented with shortness of breath and hemoglobin of 6 (baseline 7-8) -Was transfused 2 units PRBC on admission. -Hemoglobin/Hct 05/26/20 was 6.8/21.3 and  she was transfused 2 additional units; Repeat CBC yesterday AM was 8.2/25.3 -> 7.7/24.4 -> 7.1/21.8 -> 7.8/24.1 -> 8.0/25.4 -S/p total 5 units of pRBC's -Continue to Hold Aspirin -Continue iron supplementation with Ferrous Sulfate 325 mg po BID -Patient has an IUD however tells me that she was unable to make it to her chemo/radiation treatments -Gyn Onc consulted and appreciated and recommended radiation oncology consultation -Radiation oncology recommended 10 radiation treatments and transfer to [facility]; Today is Day 5 of Rad Onc Treatment  -Continue to monitor CBC carefully  Hemorrhoids -We will continue HC suppositories -There is some concern that hemorrhoids may be contributing to bleeding -We will need to follow this closely, will ask nurses to assess when they change her.  Abnormal EKG Regular narrow complex bradycardia read as sinus by EKG machine  P wave and T waves are diffusely flattened so is difficult for me to confirm that QTC is elevated at 507, will hold Levaquin This is without any change from previous EKG from last month.  Chronic Kidney Disease, Stage IV, stable -BUN/Creatinine appears to be at baseline and actually improved -BUN/Cr went frm 37/2.72 -> 35/2.48 -> 38/2.35 -> 42/2.47 -> 46/2.22 -> 48/2.17 -Patient had a Foley catheter but this got dislodged and will hold off replacing for now -Avoid further Nephrotoxic Medications, Contrast Dyes, Hypotension and Renally adjust Medications -Repeat CMP in the AM   Essential HTN -Patient is actually hypertensive despite ongoing vaginal bleeding -We will Continue Amlodipine 10, Conidine 0.2 mg twice daily, Carvedilol 6.25 mg po BID and Hydralazine 100 mg q8h per home doses -We will also continue Furosemide 60 mg po BID and Metolazone 5 given patient is to receive 2 units of blood and she has significant lower extremity edema -BP was elevated at 110/51 this AM -May need IV Hydralazine  -Continue to Monitor BP per  protocol   Diet Controlled DM2 -Patient does not appear to be on any antidiabetic medicine -Last HbA1c was 5.9 on 04/22/20 -Will place patient on carb controlled diet and check fingersticks  -SSI not ordered -Continue to monitor blood sugars carefully; CBG's ranging from 112-166 -Blood sugars ranging from 105-141 on daily BMPs  Stage II Sacral Ulcer -Present on Admission -Wound care consulted  Deconditioning -PT consulted for further evaluation and recommending SNF but unfortunately she was denied Authorization and When I called for the Peer-to-Peer I was not connected to anyone live and was on hold for 20 minutes -PT/OT to continue to work with the patient and still recommending SNF level of care but if not 24 hour Supervision/Assistance -Will need re-evaluation prior to D/C  Hypokalemia -Patient's K+ is now 3.3; Replete with po KCl 40 mQ x1 -Check Mag Level and was 1.9 -Continue to Monitor and and Replete as Necessary -Repeat CMP in the AM   Levaquin use -Patient was started on a 7-day course of Levaquin 05/22/2020, unclear indication -Given her prolonged QTC, medication held -Currently no evidence of pneumonia or urinary tract infection  Super Morbid Obesity -Complicates overall prognosis and care -Estimated body mass index is 52.51 kg/m as calculated from the following:   Height as of this encounter: 4\' 11"  (1.499 m).   Weight as of this encounter: 117.9 kg. -Weight Loss and Dietary Counseling given   DVT prophylaxis: SCDs given Vaginal Bleeding  Code Status: FULL CODE  Family Communication: No family present at bedside  Disposition Plan: Pending further clinical improvement in her Vaginal Bleeding and Clearance by Gyn-Onc; Unfortunately denied for SNF so will likely need to go home with Home Health and anticipatign Discharging home in the next 24-48 hours   Status is: Inpatient  Remains inpatient appropriate because:Ongoing diagnostic testing needed not appropriate  for outpatient work up, Unsafe d/c plan, IV treatments appropriate due to intensity of illness or inability to take PO and Inpatient level of care appropriate due to severity of illness   Dispo: The patient is from: Home              Anticipated d/c is to: SNF              Anticipated d/c date is: 1-2 days              Patient currently is not medically stable to d/c.  Consultants:   Gyn-Oncology   Radiation-Oncology    Procedures: Radiation   Antimicrobials:  Anti-infectives (From admission, onward)   Start     Dose/Rate Route Frequency Ordered Stop   05/25/20 1800  levofloxacin (LEVAQUIN) tablet 750 mg  Status:  Discontinued        750 mg Oral Every 48 hours 05/24/20 1549 05/24/20 1610        Subjective: Seen and Examined at bedside and she states that she is not doing "not all that bad".  Thinks that her vaginal bleeding has slowed down significantly.  No chest pain, lightheadedness or dizziness.  Thinks she may be getting a little bit stronger and work with therapy.  No other concerns or complaints at this time.  Objective: Vitals:   05/30/20 2019 05/31/20 0543 05/31/20 0842 05/31/20 0940  BP: (!) 153/74 134/60 (!) 152/66 (!) 110/51  Pulse: 67 65 68 68  Resp: 14 14 16    Temp: 97.7 F (36.5 C) 97.9 F (36.6 C) 98.1 F (36.7 C)   TempSrc: Oral Oral Oral   SpO2: 100% 90% 95% 96%  Weight:      Height:        Intake/Output Summary (Last 24 hours) at 05/31/2020 1219 Last data filed at 05/31/2020 0957 Gross per 24 hour  Intake 360 ml  Output 1277 ml  Net -917 ml   Filed Weights   05/24/20 1245  Weight: 117.9 kg   Examination: Physical Exam:  Constitutional: WN/WD super morbidly obese African-American female currently in no acute distress who is up in the chair at bedside and appears calm and comfortable Eyes: Lids and conjunctivae normal, sclerae anicteric  ENMT: External Ears, Nose appear normal. Grossly normal hearing.  Neck: Appears normal, supple, no  cervical masses, normal ROM, no appreciable thyromegaly; no JVD Respiratory: Diminished to auscultation bilaterally, no wheezing, rales, rhonchi or crackles. Normal respiratory effort and patient is not tachypenic. No accessory muscle use.  Unlabored breathing Cardiovascular: RRR, no murmurs / rubs / gallops. S1 and S2 auscultated.  Has trace extremity edema and some skin changes and blistering lower extremities that are pruritic Abdomen: Soft, non-tender, distended secondary body habitus. Bowel sounds positive.  GU: Deferred. Musculoskeletal: No clubbing /  cyanosis of digits/nails. No joint deformity upper and lower extremities.  Skin: Has bilateral lower extremity skin changes that are slightly pruritic and raised. No induration; Warm and dry.  Neurologic: CN 2-12 grossly intact with no focal deficits. Romberg sign and cerebellar reflexes not assessed.  Psychiatric: Normal judgment and insight. Alert and oriented x 3. Normal mood and appropriate affect.   Data Reviewed: I have personally reviewed following labs and imaging studies  CBC: Recent Labs  Lab 05/27/20 0605 05/28/20 0552 05/29/20 0733 05/30/20 0920 05/31/20 0619  WBC 6.7 5.9 5.5 4.9 3.9*  NEUTROABS  --  4.4 4.1 3.7 2.7  HGB 8.2* 7.7* 7.1* 7.8* 8.0*  HCT 25.3* 24.4* 21.8* 24.1* 25.4*  MCV 93.4 94.9 92.0 92.0 96.2  PLT 173 177 203 194 465   Basic Metabolic Panel: Recent Labs  Lab 05/27/20 0605 05/28/20 0552 05/29/20 0733 05/30/20 0920 05/31/20 0619  NA 141 141 141 141 142  K 3.2* 3.8 3.7 3.5 3.3*  CL 102 99 99 97* 96*  CO2 32 29 32 33* 33*  GLUCOSE 125* 125* 120* 105* 123*  BUN 35* 38* 42* 46* 48*  CREATININE 2.48* 2.35* 2.47* 2.22* 2.17*  CALCIUM 8.1* 8.0* 8.3* 8.3* 8.3*  MG  --  2.0 2.0 1.9 1.9  PHOS  --  4.2 4.4 4.2 4.3   GFR: Estimated Creatinine Clearance: 29.8 mL/min (A) (by C-G formula based on SCr of 2.17 mg/dL (H)). Liver Function Tests: Recent Labs  Lab 05/28/20 0552 05/29/20 0733 05/30/20 0920  05/31/20 0619  AST 14* 17 18 17   ALT 8 8 10 9   ALKPHOS 82 90 91 86  BILITOT 0.5 0.7 0.8 0.7  PROT 6.0* 6.1* 6.2* 6.0*  ALBUMIN 2.4* 2.5* 2.6* 2.5*   No results for input(s): LIPASE, AMYLASE in the last 168 hours. No results for input(s): AMMONIA in the last 168 hours. Coagulation Profile: No results for input(s): INR, PROTIME in the last 168 hours. Cardiac Enzymes: No results for input(s): CKTOTAL, CKMB, CKMBINDEX, TROPONINI in the last 168 hours. BNP (last 3 results) No results for input(s): PROBNP in the last 8760 hours. HbA1C: No results for input(s): HGBA1C in the last 72 hours. CBG: Recent Labs  Lab 05/30/20 0732 05/30/20 1154 05/30/20 1631 05/31/20 0718 05/31/20 1217  GLUCAP 103* 160* 166* 112* 117*   Lipid Profile: No results for input(s): CHOL, HDL, LDLCALC, TRIG, CHOLHDL, LDLDIRECT in the last 72 hours. Thyroid Function Tests: No results for input(s): TSH, T4TOTAL, FREET4, T3FREE, THYROIDAB in the last 72 hours. Anemia Panel: No results for input(s): VITAMINB12, FOLATE, FERRITIN, TIBC, IRON, RETICCTPCT in the last 72 hours. Sepsis Labs: No results for input(s): PROCALCITON, LATICACIDVEN in the last 168 hours.  Recent Results (from the past 240 hour(s))  Respiratory Panel by RT PCR (Flu A&B, Covid) - Nasopharyngeal Swab     Status: None   Collection Time: 05/24/20  3:38 PM   Specimen: Nasopharyngeal Swab  Result Value Ref Range Status   SARS Coronavirus 2 by RT PCR NEGATIVE NEGATIVE Final    Comment: (NOTE) SARS-CoV-2 target nucleic acids are NOT DETECTED.  The SARS-CoV-2 RNA is generally detectable in upper respiratoy specimens during the acute phase of infection. The lowest concentration of SARS-CoV-2 viral copies this assay can detect is 131 copies/mL. A negative result does not preclude SARS-Cov-2 infection and should not be used as the sole basis for treatment or other patient management decisions. A negative result may occur with  improper specimen  collection/handling, submission of specimen  other than nasopharyngeal swab, presence of viral mutation(s) within the areas targeted by this assay, and inadequate number of viral copies (<131 copies/mL). A negative result must be combined with clinical observations, patient history, and epidemiological information. The expected result is Negative.  Fact Sheet for Patients:  PinkCheek.be  Fact Sheet for Healthcare Providers:  GravelBags.it  This test is no t yet approved or cleared by the [place] FDA and  has been authorized for detection and/or diagnosis of SARS-CoV-2 by FDA under an Emergency Use Authorization (EUA). This EUA will remain  in effect (meaning this test can be used) for the duration of the COVID-19 declaration under Section 564(b)(1) of the Act, 21 U.S.C. section 360bbb-3(b)(1), unless the authorization is terminated or revoked sooner.     Influenza A by PCR NEGATIVE NEGATIVE Final   Influenza B by PCR NEGATIVE NEGATIVE Final    Comment: (NOTE) The Xpert Xpress SARS-CoV-2/FLU/RSV assay is intended as an aid in  the diagnosis of influenza from Nasopharyngeal swab specimens and  should not be used as a sole basis for treatment. Nasal washings and  aspirates are unacceptable for Xpert Xpress SARS-CoV-2/FLU/RSV  testing.  Fact Sheet for Patients: PinkCheek.be  Fact Sheet for Healthcare Providers: GravelBags.it  This test is not yet approved or cleared by the [place] FDA and  has been authorized for detection and/or diagnosis of SARS-CoV-2 by  FDA under an Emergency Use Authorization (EUA). This EUA will remain  in effect (meaning this test can be used) for the duration of the  Covid-19 declaration under Section 564(b)(1) of the Act, 21  U.S.C. section 360bbb-3(b)(1), unless the authorization is  terminated or revoked. Performed at [facility], [place] [address], [place], [place] [address]      RN Pressure Injury Documentation: Pressure Injury 05/29/20 Thigh Posterior;Proximal;Right Stage 2 -  Partial thickness loss of dermis presenting as a shallow open injury with a red, pink wound bed without slough. 3 small open areas (Active)  05/29/20 2000  Location: Thigh  Location Orientation: Posterior;Proximal;Right  Staging: Stage 2 -  Partial thickness loss of dermis presenting as a shallow open injury with a red, pink wound bed without slough.  Wound Description (Comments): 3 small open areas  Present on Admission: Yes   Estimated body mass index is 52.51 kg/m as calculated from the following:   Height as of this encounter: 4\' 11"  (1.499 m).   Weight as of this encounter: 117.9 kg.  Malnutrition Type:   Malnutrition Characteristics:   Nutrition Interventions:    Radiology Studies: No results found.  Scheduled Meds: . sodium chloride   Intravenous Once  . amLODipine  10 mg Oral Daily  . carvedilol  6.25 mg Oral BID WC  . Chlorhexidine Gluconate Cloth  6 each Topical Daily  . cloNIDine  0.2 mg Oral BID WC  . diphenhydrAMINE  25 mg Oral TID  . ferrous sulfate  325 mg Oral BID WC  . furosemide  60 mg Oral BID  . hydrALAZINE  100 mg Oral Q8H  . hydrocerin  1 application Topical Daily  . hydrocortisone  25 mg Rectal Daily  . metolazone  5 mg Oral Daily  . nutrition supplement (JUVEN)  1 packet Oral BID BM  . polyethylene glycol  17 g Oral Daily  . sodium bicarbonate  1,300 mg Oral BID WC   Continuous Infusions:   LOS: 6 days   Kerney Elbe, DO [facility] PAGER is on [place]  If  7PM-7AM, please contact night-coverage [email or web address]

## 2020-06-01 ENCOUNTER — Ambulatory Visit
Admit: 2020-06-01 | Discharge: 2020-06-01 | Disposition: A | Discharge status: Home / Self Care | Payer: Medicare Other | Attending: Radiation Oncology | Admitting: Radiation Oncology

## 2020-06-01 DIAGNOSIS — R609 Edema, unspecified: Secondary | ICD-10-CM | POA: Diagnosis not present

## 2020-06-01 DIAGNOSIS — E1122 Type 2 diabetes mellitus with diabetic chronic kidney disease: Secondary | ICD-10-CM | POA: Diagnosis not present

## 2020-06-01 DIAGNOSIS — N939 Abnormal uterine and vaginal bleeding, unspecified: Secondary | ICD-10-CM | POA: Diagnosis not present

## 2020-06-01 DIAGNOSIS — I129 Hypertensive chronic kidney disease with stage 1 through stage 4 chronic kidney disease, or unspecified chronic kidney disease: Secondary | ICD-10-CM | POA: Diagnosis not present

## 2020-06-01 LAB — CBC WITH DIFFERENTIAL/PLATELET
Abs Immature Granulocytes: 0.01 10*3/uL (ref 0.00–0.07)
Basophils Absolute: 0 10*3/uL (ref 0.0–0.1)
Basophils Relative: 1 %
Eosinophils Absolute: 0.1 10*3/uL (ref 0.0–0.5)
Eosinophils Relative: 3 %
HCT: 26.1 % — ABNORMAL LOW (ref 36.0–46.0)
Hemoglobin: 8.2 g/dL — ABNORMAL LOW (ref 12.0–15.0)
Immature Granulocytes: 0 %
Lymphocytes Relative: 13 %
Lymphs Abs: 0.4 10*3/uL — ABNORMAL LOW (ref 0.7–4.0)
MCH: 30.3 pg (ref 26.0–34.0)
MCHC: 31.4 g/dL (ref 30.0–36.0)
MCV: 96.3 fL (ref 80.0–100.0)
Monocytes Absolute: 0.3 10*3/uL (ref 0.1–1.0)
Monocytes Relative: 8 %
Neutro Abs: 2.5 10*3/uL (ref 1.7–7.7)
Neutrophils Relative %: 75 %
Platelets: 179 10*3/uL (ref 150–400)
RBC: 2.71 MIL/uL — ABNORMAL LOW (ref 3.87–5.11)
RDW: 16.1 % — ABNORMAL HIGH (ref 11.5–15.5)
WBC: 3.3 10*3/uL — ABNORMAL LOW (ref 4.0–10.5)
nRBC: 0 % (ref 0.0–0.2)

## 2020-06-01 LAB — COMPREHENSIVE METABOLIC PANEL
ALT: 11 U/L (ref 0–44)
AST: 18 U/L (ref 15–41)
Albumin: 2.6 g/dL — ABNORMAL LOW (ref 3.5–5.0)
Alkaline Phosphatase: 84 U/L (ref 38–126)
Anion gap: 11 (ref 5–15)
BUN: 50 mg/dL — ABNORMAL HIGH (ref 8–23)
CO2: 34 mmol/L — ABNORMAL HIGH (ref 22–32)
Calcium: 8.4 mg/dL — ABNORMAL LOW (ref 8.9–10.3)
Chloride: 95 mmol/L — ABNORMAL LOW (ref 98–111)
Creatinine, Ser: 2.19 mg/dL — ABNORMAL HIGH (ref 0.44–1.00)
GFR, Estimated: 24 mL/min — ABNORMAL LOW (ref >60)
Glucose, Bld: 129 mg/dL — ABNORMAL HIGH (ref 70–99)
Potassium: 3.3 mmol/L — ABNORMAL LOW (ref 3.5–5.1)
Sodium: 140 mmol/L (ref 135–145)
Total Bilirubin: 0.7 mg/dL (ref 0.3–1.2)
Total Protein: 6.5 g/dL (ref 6.5–8.1)

## 2020-06-01 LAB — MAGNESIUM: Magnesium: 2 mg/dL (ref 1.7–2.4)

## 2020-06-01 LAB — GLUCOSE, CAPILLARY
Glucose-Capillary: 114 mg/dL — ABNORMAL HIGH (ref 70–99)
Glucose-Capillary: 144 mg/dL — ABNORMAL HIGH (ref 70–99)
Glucose-Capillary: 120 mg/dL — ABNORMAL HIGH (ref 70–99)
Glucose-Capillary: 116 mg/dL — ABNORMAL HIGH (ref 70–99)

## 2020-06-01 LAB — PHOSPHORUS: Phosphorus: 4.1 mg/dL (ref 2.5–4.6)

## 2020-06-01 MED ORDER — POTASSIUM CHLORIDE CRYS ER 20 MEQ PO TBCR
30.0000 meq | EXTENDED_RELEASE_TABLET | Freq: Two times a day (BID) | ORAL | Status: AC
  Administered 2020-06-01 (×2): 30 meq via ORAL
  Filled 2020-06-01 (×2): qty 1

## 2020-06-01 MED ORDER — THERA-DERM EX LOTN
TOPICAL_LOTION | CUTANEOUS | Status: DC | PRN
  Filled 2020-06-01: qty 237

## 2020-06-01 MED ORDER — POTASSIUM CHLORIDE CRYS ER 20 MEQ PO TBCR: 40.0000 meq | EXTENDED_RELEASE_TABLET | Freq: Once | ORAL | Status: DC

## 2020-06-01 MED ORDER — POTASSIUM CHLORIDE CRYS ER 20 MEQ PO TBCR
40.0000 meq | EXTENDED_RELEASE_TABLET | Freq: Once | ORAL | Status: AC
  Administered 2020-06-01: 40 meq via ORAL
  Filled 2020-06-01: qty 2

## 2020-06-01 NOTE — Progress Notes (Signed)
[facility] Therapy Treatment Record Phone [phone]   Radiation Therapy was administered to Vanessa [name] on: 06/01/2020  1:29 PM and was treatment # 6 out of a planned course of 10 treatments.  Radiation Treatment  1). Beam photons with 6-10 energy  2). Brachytherapy None  3). Stereotactic Radiosurgery None  4). Other Radiation None     Pilot Prindle A Bruin Bolger, RT (T)

## 2020-06-01 NOTE — Progress Notes (Signed)
PROGRESS NOTE    Vanessa [name]  KZL:[id number] DOB: 06-07-1956 DOA: 05/24/2020 PCP: Pcp, No   Brief Narrative:  HPI per Dr. Bonnell Public on 05/24/20 Vanessa [name] is an 64 y.o. female with PMH significant for recent diagnosis of endometrial CA, HTN, CKD 3, diet-controlled diabetes and recent admission for lower extremity cellulitis who was sent over from her rehab facility for significant increase in her vaginal bleeding.  Patient apparently has chronic vaginal bleeding however over the past couple of days apparently she has been discharging a lot of clots and much more bleeding than usual.  Patient herself is unaware of this as she spends most of her day in bed and wears a diaper.  She was alarmed however when she saw how much blood there was in her diaper earlier today.  She agreed with staff at the rehab facility that she needed to come to the ED.  Patient admits to feeling somewhat weak.  She notes "I do not feel good but I do not feel as bad as I could".  She denies any chest pain.  She does admit to having some shortness of breath earlier today however that improved with sitting up.  She denies general orthopnea or PND usually.  Patient admits she does not exert herself very much.  Denies any dizziness while lying in bed.  Patient specifically denies history of congestive heart failure or pulmonary edema.  Denies known history of CAD or MI.  Echocardiogram done March 2021 shows EF of 50 to 60% with grade 2 diastolic dysfunction.  She does have moderately elevated PA pressures with peak pressure of 57.  ED Course:  The patient was noted to be afebrile and normotensive.  Laboratory work-up was notable for hemoglobin of 6, down from 8.1 on 04/30/2020.  She has known CKD and her creatinine is at baseline at 2.5.  Patient was discussed with GYN in the ED, they recommended admission to hospitalist with comanagement by them.  They will manage her vaginal bleeding.  Patient was started on  transfusion 2 unit PRBC.  **Interim History  She was Admitted with symptomatic anemia, vaginal bleeding due to endometrial cancer.  Gynecology oncology consulted as well as radiation oncology.  Patient was transfused 4 units total as her hemoglobin dropped again to 6.8 the day before yesterday (2 units given).  She was transferred to [facility] for radiation therapy.  She has undergone 5 radiation treatment and has been transfused a total of 5 units of PRBCs.  Unfortunately her insurance has denied authorization for SNF and when I called for peer-to-peer it would not let me connect to anybody live and I did not get to discuss the case with anyone today.  05/30/2020 Her renal function is slightly improved compared to yesterday but blood count is only minimally improved from the 1 unit of PRBCs.  We will continue monitor if she continues to have some bleeding but she states this is slow down.  CBGs ranging from 103-1 60.  Overall she is feeling a little bit better today but her Foley came out today  05/31/2020 She feels a little bit better today and not as weak.  PT OT still working with the patient.  Underwent radiation again today and this is day 5 of her treatments.  Thinks her vaginal bleeding is slowing down and Foley catheter removed yesterday.  06/01/20 patient is doing better today and thinks that her vaginal bleeding is minimal.  She has unsafe discharge disposition at  home as we cannot reach family and unfortunately she got denied for SNF.  She cannot go home as she will require 24-hour supervision and unable to reach out to the husband.  We do not have the patient's children's number.  Also she will need transportation for her radiation treatment daily because of the holiday week this week she is going to go treatment until the 30th  Assessment & Plan:   Principal Problem:   Abnormal uterine bleeding due to primary malignant neoplasm of endometrium (HCC) Active Problems:   DM type 2  (diabetes mellitus, type 2) (HCC)   Dyslipidemia   Symptomatic anemia   Benign hypertension with CKD (chronic kidney disease) stage III (HCC)   Endometrial cancer (HCC)   Iron deficiency anemia due to chronic blood loss   Edema  Symptomatic anemia/ Acute on chronic normocytic anemia/ Vaginal bleeding secondary to endometrial cancer -Patient presented with shortness of breath and hemoglobin of 6 (baseline 7-8) -Was transfused 2 units PRBC on admission. -Hemoglobin/Hct 05/26/20 was 6.8/21.3 and she was transfused 2 additional units; Repeat CBC yesterday AM was 8.2/25.3 -> 7.7/24.4 -> 7.1/21.8 -> 7.8/24.1 -> 8.0/25.4 -> 8.2/26.1 -S/p total 5 units of pRBC's -Continue to Hold Aspirin -Continue iron supplementation with Ferrous Sulfate 325 mg po BID -Patient has an IUD however tells me that she was unable to make it to her chemo/radiation treatments -Gyn Onc consulted and appreciated and recommended radiation oncology consultation -Radiation oncology recommended 10 radiation treatments and transfer to [facility]; Today is Day 6 of Rad Onc Treatment and she will continue to get treatment until the 30th given that this is a holiday week -Continue to monitor CBC carefully -She has unsafe discharge disposition at home as we cannot reach family and unfortunately she got denied for SNF.  She cannot go home as she will require 24-hour supervision and unable to reach out to the husband.  We do not have the patient's children's number.  Also she will need transportation for her radiation treatment daily because of the holiday week this week she is going to go treatment until the 30th  Hemorrhoids -We will continue HC suppositories -There is some concern that hemorrhoids may be contributing to bleeding -We will need to follow this closely, will ask nurses to assess when they change her.  Abnormal EKG Regular narrow complex bradycardia read as sinus by EKG machine  P wave and T waves are diffusely  flattened so is difficult for me to confirm that QTC is elevated at 507, will hold Levaquin This is without any change from previous EKG from last month.  Chronic Kidney Disease, Stage IV, stable -BUN/Creatinine appears to be at baseline and actually improved -BUN/Cr went frm 37/2.72 -> 35/2.48 -> 38/2.35 -> 42/2.47 -> 46/2.22 -> 48/2.17 -> 50/2.19 -Patient had a Foley catheter but this got dislodged and will hold off replacing for now -Avoid further Nephrotoxic Medications, Contrast Dyes, Hypotension and Renally adjust Medications -Repeat CMP in the AM   Essential HTN -Patient is actually hypertensive despite ongoing vaginal bleeding -We will Continue Amlodipine 10, Conidine 0.2 mg twice daily, Carvedilol 6.25 mg po BID and Hydralazine 100 mg q8h per home doses -We will also continue Furosemide 60 mg po BID and Metolazone 5 given patient is to receive 2 units of blood and she has significant lower extremity edema -BP was elevated at 110/51 this AM -May need IV Hydralazine  -Continue to Monitor BP per protocol   Diet Controlled DM2 -Patient does not appear  to be on any antidiabetic medicine -Last HbA1c was 5.9 on 04/22/20 -Will place patient on carb controlled diet and check fingersticks  -SSI not ordered -Continue to monitor blood sugars carefully; CBG's ranging from 114-148 -Blood sugars ranging from 105-141 on daily BMPs  Stage II Sacral Ulcer -Present on Admission -Wound care consulted  Deconditioning -PT consulted for further evaluation and recommending SNF but unfortunately she was denied Authorization and When I called for the Peer-to-Peer I was not connected to anyone live and was on hold for 20 minutes -PT/OT to continue to work with the patient and still recommending SNF level of care but if not 24 hour Supervision/Assistance -Will need re-evaluation prior to D/C and it is still recommending SNF level of care however she will need to go home with home health maximal  therapy but it is currently unsafe discharge disposition is she will need 24-hour supervision assistant and we cannot get in touch with the patient's husband or the patient's kids  Hypokalemia -Patient's K+ is now 3.3; Replete with po KCl 30 mEq twice daily x2 doses -Check Mag Level and was 1.9 -Continue to Monitor and and Replete as Necessary -Repeat CMP in the AM   Levaquin use -Patient was started on a 7-day course of Levaquin 05/22/2020, unclear indication -Given her prolonged QTC, medication held -Currently no evidence of pneumonia or urinary tract infection  Super Morbid Obesity -Complicates overall prognosis and care -Estimated body mass index is 52.51 kg/m as calculated from the following:   Height as of this encounter: 4\' 11"  (1.499 m).   Weight as of this encounter: 117.9 kg. -Weight Loss and Dietary Counseling given   DVT prophylaxis: SCDs given Vaginal Bleeding  Code Status: FULL CODE  Family Communication: No family present at bedside  Disposition Plan: Pending further clinical improvement in her Vaginal Bleeding and Clearance by Gyn-Onc; Unfortunately denied for SNF so will likely need to go home with Home Health and anticipatign Discharging home in the next 24-48 hours   Status is: Inpatient  Remains inpatient appropriate because:Ongoing diagnostic testing needed not appropriate for outpatient work up, Unsafe d/c plan, IV treatments appropriate due to intensity of illness or inability to take PO and Inpatient level of care appropriate due to severity of illness   Dispo: The patient is from: Home              Anticipated d/c is to: SNF now she will need to go home              Anticipated d/c date is: 1-2 days when she has a safe discharge disposition              Patient currently is not medically stable to d/c.  Consultants:   Gyn-Oncology   Radiation-Oncology    Procedures: Radiation   Antimicrobials:  Anti-infectives (From admission, onward)   Start      Dose/Rate Route Frequency Ordered Stop   05/25/20 1800  levofloxacin (LEVAQUIN) tablet 750 mg  Status:  Discontinued        750 mg Oral Every 48 hours 05/24/20 1549 05/24/20 1610        Subjective: Seen and Examined at bedside and thinks her vaginal bleeding is almost resolved but continues to have very minimal.  No chest pain, lightheadedness or dizziness.  States her legs are itching and very dry and wanted some lotion.  Has an unsafe discharge disposition as we cannot reach the patient's husband yet and TOC has initiated a wellfare  check on the patient's husband.  Unable to reach the patient's kids as the patient does not remember the numbers.  Currently will need to remain hospitalized until a safe discharge disposition is obtained as PT OT recommending SNF level care but she is unfortunately denied but her insurance.  No other concerns or plans at this time and overall she is feeling relatively well  Objective: Vitals:   05/31/20 1957 06/01/20 0546 06/01/20 1131 06/01/20 1133  BP: 135/63 (!) 157/72 (!) 162/68 (!) 162/68  Pulse: 64 66    Resp: 16 14    Temp: 98.4 F (36.9 C) 98.5 F (36.9 C)    TempSrc: Oral Oral    SpO2: 94% 97%    Weight:      Height:        Intake/Output Summary (Last 24 hours) at 06/01/2020 1245 Last data filed at 06/01/2020 0930 Gross per 24 hour  Intake 477 ml  Output 2000 ml  Net -1523 ml   Filed Weights   05/24/20 1245  Weight: 117.9 kg   Examination: Physical Exam:  Constitutional: WN/WD super morbidly obese AAF in NAD sitting in the chair at the bedside appears calm and comfortable Eyes: Lids and conjunctivae normal, sclerae anicteric  ENMT: External Ears, Nose appear normal. Grossly normal hearing.  Neck: Appears normal, supple, no cervical masses, normal ROM, no appreciable thyromegaly; no JVD Respiratory: Diminished to auscultation bilaterally, no wheezing, rales, rhonchi or crackles. Normal respiratory effort and patient is not  tachypenic. No accessory muscle use. Unlabored breathing  Cardiovascular: RRR, no murmurs / rubs / gallops. S1 and S2 auscultated. No extremity edema. 2+ pedal pulses. No carotid bruits.  Abdomen: Soft, non-tender, distended secondary to body habitus. Bowel sounds positive.  GU: Deferred. Musculoskeletal: No clubbing / cyanosis of digits/nails. No joint deformity upper and lower extremities.  Skin: Has bilateral lower extremity skin changes that are dry and slightly pruritic and raised.  No induration; Warm and dry.  Neurologic: CN 2-12 grossly intact with no focal deficits. Romberg sign and cerebellar reflexes not assessed.  Psychiatric: Normal judgment and insight. Alert and oriented x 3. Normal mood and appropriate affect.   Data Reviewed: I have personally reviewed following labs and imaging studies  CBC: Recent Labs  Lab 05/28/20 0552 05/29/20 0733 05/30/20 0920 05/31/20 0619 06/01/20 0558  WBC 5.9 5.5 4.9 3.9* 3.3*  NEUTROABS 4.4 4.1 3.7 2.7 2.5  HGB 7.7* 7.1* 7.8* 8.0* 8.2*  HCT 24.4* 21.8* 24.1* 25.4* 26.1*  MCV 94.9 92.0 92.0 96.2 96.3  PLT 177 203 194 167 962   Basic Metabolic Panel: Recent Labs  Lab 05/28/20 0552 05/29/20 0733 05/30/20 0920 05/31/20 0619 06/01/20 0558  NA 141 141 141 142 140  K 3.8 3.7 3.5 3.3* 3.3*  CL 99 99 97* 96* 95*  CO2 29 32 33* 33* 34*  GLUCOSE 125* 120* 105* 123* 129*  BUN 38* 42* 46* 48* 50*  CREATININE 2.35* 2.47* 2.22* 2.17* 2.19*  CALCIUM 8.0* 8.3* 8.3* 8.3* 8.4*  MG 2.0 2.0 1.9 1.9 2.0  PHOS 4.2 4.4 4.2 4.3 4.1   GFR: Estimated Creatinine Clearance: 29.6 mL/min (A) (by C-G formula based on SCr of 2.19 mg/dL (H)). Liver Function Tests: Recent Labs  Lab 05/28/20 0552 05/29/20 0733 05/30/20 0920 05/31/20 0619 06/01/20 0558  AST 14* 17 18 17 18   ALT 8 8 10 9 11   ALKPHOS 82 90 91 86 84  BILITOT 0.5 0.7 0.8 0.7 0.7  PROT 6.0* 6.1* 6.2* 6.0* 6.5  ALBUMIN 2.4* 2.5* 2.6* 2.5* 2.6*   No results for input(s): LIPASE, AMYLASE  in the last 168 hours. No results for input(s): AMMONIA in the last 168 hours. Coagulation Profile: No results for input(s): INR, PROTIME in the last 168 hours. Cardiac Enzymes: No results for input(s): CKTOTAL, CKMB, CKMBINDEX, TROPONINI in the last 168 hours. BNP (last 3 results) No results for input(s): PROBNP in the last 8760 hours. HbA1C: No results for input(s): HGBA1C in the last 72 hours. CBG: Recent Labs  Lab 05/31/20 1217 05/31/20 1720 05/31/20 1959 06/01/20 0725 06/01/20 1215  GLUCAP 117* 121* 148* 114* 144*   Lipid Profile: No results for input(s): CHOL, HDL, LDLCALC, TRIG, CHOLHDL, LDLDIRECT in the last 72 hours. Thyroid Function Tests: No results for input(s): TSH, T4TOTAL, FREET4, T3FREE, THYROIDAB in the last 72 hours. Anemia Panel: No results for input(s): VITAMINB12, FOLATE, FERRITIN, TIBC, IRON, RETICCTPCT in the last 72 hours. Sepsis Labs: No results for input(s): PROCALCITON, LATICACIDVEN in the last 168 hours.  Recent Results (from the past 240 hour(s))  Respiratory Panel by RT PCR (Flu A&B, Covid) - Nasopharyngeal Swab     Status: None   Collection Time: 05/24/20  3:38 PM   Specimen: Nasopharyngeal Swab  Result Value Ref Range Status   SARS Coronavirus 2 by RT PCR NEGATIVE NEGATIVE Final    Comment: (NOTE) SARS-CoV-2 target nucleic acids are NOT DETECTED.  The SARS-CoV-2 RNA is generally detectable in upper respiratoy specimens during the acute phase of infection. The lowest concentration of SARS-CoV-2 viral copies this assay can detect is 131 copies/mL. A negative result does not preclude SARS-Cov-2 infection and should not be used as the sole basis for treatment or other patient management decisions. A negative result may occur with  improper specimen collection/handling, submission of specimen other than nasopharyngeal swab, presence of viral mutation(s) within the areas targeted by this assay, and inadequate number of viral copies (<131  copies/mL). A negative result must be combined with clinical observations, patient history, and epidemiological information. The expected result is Negative.  Fact Sheet for Patients:  PinkCheek.be  Fact Sheet for Healthcare Providers:  GravelBags.it  This test is no t yet approved or cleared by the [place] FDA and  has been authorized for detection and/or diagnosis of SARS-CoV-2 by FDA under an Emergency Use Authorization (EUA). This EUA will remain  in effect (meaning this test can be used) for the duration of the COVID-19 declaration under Section 564(b)(1) of the Act, 21 U.S.C. section 360bbb-3(b)(1), unless the authorization is terminated or revoked sooner.     Influenza A by PCR NEGATIVE NEGATIVE Final   Influenza B by PCR NEGATIVE NEGATIVE Final    Comment: (NOTE) The Xpert Xpress SARS-CoV-2/FLU/RSV assay is intended as an aid in  the diagnosis of influenza from Nasopharyngeal swab specimens and  should not be used as a sole basis for treatment. Nasal washings and  aspirates are unacceptable for Xpert Xpress SARS-CoV-2/FLU/RSV  testing.  Fact Sheet for Patients: PinkCheek.be  Fact Sheet for Healthcare Providers: GravelBags.it  This test is not yet approved or cleared by the [place] FDA and  has been authorized for detection and/or diagnosis of SARS-CoV-2 by  FDA under an Emergency Use Authorization (EUA). This EUA will remain  in effect (meaning this test can be used) for the duration of the  Covid-19 declaration under Section 564(b)(1) of the Act, 21  U.S.C. section 360bbb-3(b)(1), unless the authorization is  terminated or revoked. Performed at [facility],  [address] [place], [place] [address]      RN Pressure Injury Documentation: Pressure Injury 05/29/20 Thigh Posterior;Proximal;Right Stage 2 -  Partial thickness loss of  dermis presenting as a shallow open injury with a red, pink wound bed without slough. 3 small open areas (Active)  05/29/20 2000  Location: Thigh  Location Orientation: Posterior;Proximal;Right  Staging: Stage 2 -  Partial thickness loss of dermis presenting as a shallow open injury with a red, pink wound bed without slough.  Wound Description (Comments): 3 small open areas  Present on Admission: Yes   Estimated body mass index is 52.51 kg/m as calculated from the following:   Height as of this encounter: 4\' 11"  (1.499 m).   Weight as of this encounter: 117.9 kg.  Malnutrition Type:   Malnutrition Characteristics:   Nutrition Interventions:    Radiology Studies: No results found.  Scheduled Meds: . sodium chloride   Intravenous Once  . amLODipine  10 mg Oral Daily  . carvedilol  6.25 mg Oral BID WC  . Chlorhexidine Gluconate Cloth  6 each Topical Daily  . cloNIDine  0.2 mg Oral BID WC  . diphenhydrAMINE  25 mg Oral TID  . ferrous sulfate  325 mg Oral BID WC  . furosemide  60 mg Oral BID  . hydrALAZINE  100 mg Oral Q8H  . hydrocerin  1 application Topical Daily  . hydrocortisone  25 mg Rectal Daily  . metolazone  5 mg Oral Daily  . nutrition supplement (JUVEN)  1 packet Oral BID BM  . polyethylene glycol  17 g Oral Daily  . potassium chloride  30 mEq Oral BID  . sodium bicarbonate  1,300 mg Oral BID WC   Continuous Infusions:   LOS: 7 days   Kerney Elbe, DO [facility] PAGER is on AMION  If 7PM-7AM, please contact night-coverage [email or web address]

## 2020-06-01 NOTE — Progress Notes (Signed)
PT Cancellation Note  Patient Details Name: Vanessa [name] MRN: [id number] DOB: [birth year]   Cancelled Treatment:    Reason Eval/Treat Not Completed: Patient at procedure or test/unavailable per RN she is currently off unit for radiation treatment, unsure how long this will take to be completed. Will attempt to return later in day if time/schedule allow.    Windell Norfolk, DPT, PN1   Supplemental Physical Therapist [facility]    Pager [phone] Acute Rehab Office [phone]

## 2020-06-01 NOTE — TOC Progression Note (Signed)
Transition of Care [facility]) - Progression Note    Patient Details  Name: Vanessa [name] MRN: [id number] Date of Birth: 08-19-1955  Transition of [facility] CM/SW Contact  Quadir Muns, Marjie Skiff, RN Phone Number: 06/01/2020, 3:04 PM  Clinical Narrative:    Multiple voice mails left for husband on cell phone and home phone. No return calls. Pt states that this is not normal for him that he normally returns phone calls. Wellness Check called into non-emergent EMS. Pt agrees that this is the best course of action as she is worried about her husband. He has cancer as well. Husband's description and name given to EMS along with this CM's phone number. TOC will continue to follow.  Expected Discharge Plan: Skilled Nursing Facility Barriers to Discharge: SNF Pending bed offer, Continued Medical Work up  Expected Discharge Plan and Services Expected Discharge Plan: [place] arrangements for the past 2 months: [place], Apartment     Social Determinants of Health (SDOH) Interventions    Readmission Risk Interventions Readmission Risk Prevention Plan 11/08/2019 10/21/2019 09/25/2019  Transportation Screening Complete Complete Complete  PCP or Specialist Appt within 3-5 Days - - Not Complete  Not Complete comments - - plan for SNF  HRI or [facility] - - Complete  Social Work Consult for [place] Planning/Counseling - - Complete  Palliative Care Screening - - Not Applicable  Medication Review Press photographer) Complete Referral to Pharmacy Referral to Pharmacy  PCP or Specialist appointment within 3-5 days of discharge Complete Complete -  HRI or Home Care Consult Complete (No Data) -  SW Recovery Care/Counseling Consult Complete Complete -  Palliative Care Screening Not Applicable Not Applicable -  Skilled Nursing Facility Complete Complete -  Some recent data might be hidden

## 2020-06-01 NOTE — Care Management Important Message (Signed)
Important Message  Patient Details IM Letter given to the Patient. Name: Vanessa [name] MRN: [id number] Date of Birth: 1956/04/17   Medicare Important Message Given:  Yes     Kerin Salen 06/01/2020, 10:41 AM

## 2020-06-02 ENCOUNTER — Ambulatory Visit
Admit: 2020-06-02 | Discharge: 2020-06-02 | Disposition: A | Discharge status: Home / Self Care | Payer: Medicare Other | Attending: Radiation Oncology | Admitting: Radiation Oncology

## 2020-06-02 DIAGNOSIS — N939 Abnormal uterine and vaginal bleeding, unspecified: Secondary | ICD-10-CM | POA: Diagnosis not present

## 2020-06-02 DIAGNOSIS — R609 Edema, unspecified: Secondary | ICD-10-CM | POA: Diagnosis not present

## 2020-06-02 DIAGNOSIS — I129 Hypertensive chronic kidney disease with stage 1 through stage 4 chronic kidney disease, or unspecified chronic kidney disease: Secondary | ICD-10-CM | POA: Diagnosis not present

## 2020-06-02 DIAGNOSIS — E1122 Type 2 diabetes mellitus with diabetic chronic kidney disease: Secondary | ICD-10-CM | POA: Diagnosis not present

## 2020-06-02 LAB — MAGNESIUM: Magnesium: 1.9 mg/dL (ref 1.7–2.4)

## 2020-06-02 LAB — CBC WITH DIFFERENTIAL/PLATELET
Abs Immature Granulocytes: 0.02 10*3/uL (ref 0.00–0.07)
Basophils Absolute: 0 10*3/uL (ref 0.0–0.1)
Basophils Relative: 1 %
Eosinophils Absolute: 0.2 10*3/uL (ref 0.0–0.5)
Eosinophils Relative: 4 %
HCT: 27.2 % — ABNORMAL LOW (ref 36.0–46.0)
Hemoglobin: 8.4 g/dL — ABNORMAL LOW (ref 12.0–15.0)
Immature Granulocytes: 1 %
Lymphocytes Relative: 9 %
Lymphs Abs: 0.4 10*3/uL — ABNORMAL LOW (ref 0.7–4.0)
MCH: 29.9 pg (ref 26.0–34.0)
MCHC: 30.9 g/dL (ref 30.0–36.0)
MCV: 96.8 fL (ref 80.0–100.0)
Monocytes Absolute: 0.3 10*3/uL (ref 0.1–1.0)
Monocytes Relative: 8 %
Neutro Abs: 2.9 10*3/uL (ref 1.7–7.7)
Neutrophils Relative %: 77 %
Platelets: 186 10*3/uL (ref 150–400)
RBC: 2.81 MIL/uL — ABNORMAL LOW (ref 3.87–5.11)
RDW: 15.9 % — ABNORMAL HIGH (ref 11.5–15.5)
WBC: 3.8 10*3/uL — ABNORMAL LOW (ref 4.0–10.5)
nRBC: 0 % (ref 0.0–0.2)

## 2020-06-02 LAB — COMPREHENSIVE METABOLIC PANEL
ALT: 11 U/L (ref 0–44)
AST: 17 U/L (ref 15–41)
Albumin: 2.7 g/dL — ABNORMAL LOW (ref 3.5–5.0)
Alkaline Phosphatase: 79 U/L (ref 38–126)
Anion gap: 11 (ref 5–15)
BUN: 52 mg/dL — ABNORMAL HIGH (ref 8–23)
CO2: 34 mmol/L — ABNORMAL HIGH (ref 22–32)
Calcium: 8.4 mg/dL — ABNORMAL LOW (ref 8.9–10.3)
Chloride: 95 mmol/L — ABNORMAL LOW (ref 98–111)
Creatinine, Ser: 2.03 mg/dL — ABNORMAL HIGH (ref 0.44–1.00)
GFR, Estimated: 27 mL/min — ABNORMAL LOW (ref >60)
Glucose, Bld: 169 mg/dL — ABNORMAL HIGH (ref 70–99)
Potassium: 3.5 mmol/L (ref 3.5–5.1)
Sodium: 140 mmol/L (ref 135–145)
Total Bilirubin: 0.6 mg/dL (ref 0.3–1.2)
Total Protein: 6.5 g/dL (ref 6.5–8.1)

## 2020-06-02 LAB — GLUCOSE, CAPILLARY
Glucose-Capillary: 104 mg/dL — ABNORMAL HIGH (ref 70–99)
Glucose-Capillary: 192 mg/dL — ABNORMAL HIGH (ref 70–99)
Glucose-Capillary: 130 mg/dL — ABNORMAL HIGH (ref 70–99)
Glucose-Capillary: 143 mg/dL — ABNORMAL HIGH (ref 70–99)

## 2020-06-02 LAB — PHOSPHORUS: Phosphorus: 4.1 mg/dL (ref 2.5–4.6)

## 2020-06-02 MED ORDER — POTASSIUM CHLORIDE CRYS ER 20 MEQ PO TBCR
30.0000 meq | EXTENDED_RELEASE_TABLET | Freq: Two times a day (BID) | ORAL | Status: AC
  Administered 2020-06-02 (×2): 30 meq via ORAL
  Filled 2020-06-02 (×2): qty 1

## 2020-06-02 NOTE — Progress Notes (Signed)
Physical Therapy Treatment Patient Details Name: Vanessa [name] MRN: [id number] DOB: [birth year] Today's Date: 06/02/2020    History of Present Illness Vilda Zollner is an 64 y.o. female with PMH significant for recent diagnosis of endometrial CA, HTN, CKD 3, diet-controlled diabetes and recent admission for lower extremity cellulitis who was sent over from her rehab facility for significant increase in her vaginal bleeding    PT Comments    Min/guard assist for sit to stand. Pt ambulated 4' with RW, distance limited by fatigue. Performed seated LE strengthening exercises. Increased activity tolerance today.  Follow Up Recommendations  SNF;Home health PT;Supervision for mobility/OOB (SNF recommended; HHPT if insurance declines SNF)     Equipment Recommendations  None recommended by PT    Recommendations for Other Services       Precautions / Restrictions Precautions Precautions: Fall Precaution Comments: vaginal bleeding Restrictions Weight Bearing Restrictions: No    Mobility  Bed Mobility               General bed mobility comments: up in chair  Transfers Overall transfer level: Needs assistance Equipment used: Rolling walker (2 wheeled) Transfers: Sit to/from Stand Sit to Stand: Min guard         General transfer comment: VCs hand placement, no physical assist to rise  Ambulation/Gait Ambulation/Gait assistance: Min guard Gait Distance (Feet): 4 Feet Assistive device: Rolling walker (2 wheeled) Gait Pattern/deviations: Step-through pattern;Decreased stride length Gait velocity: decr   General Gait Details: distance limited by fatigue, followed with recliner, no loss of balance   Stairs             Wheelchair Mobility    Modified Rankin (Stroke Patients Only)       Balance Overall balance assessment: Needs assistance Sitting-balance support: No upper extremity supported;Feet supported Sitting balance-Leahy Scale: Good     Standing  balance support: During functional activity;Bilateral upper extremity supported Standing balance-Leahy Scale: Fair                              Cognition Arousal/Alertness: Awake/alert Behavior During Therapy: WFL for tasks assessed/performed Overall Cognitive Status: Within Functional Limits for tasks assessed                                 General Comments: pt slightly slow to respond to cues, but pleasant and oriented to self, place and situation      Exercises General Exercises - Lower Extremity Ankle Circles/Pumps: AROM;Both;10 reps;Seated Long Arc Quad: Seated;AROM;Strengthening;Both;10 reps Hip Flexion/Marching: Seated;AROM;Strengthening;Both;5 reps    General Comments General comments (skin integrity, edema, etc.): Educated on ROM exercises for edema control.       Pertinent Vitals/Pain Pain Assessment: No/denies pain    Home Living                      Prior Function            PT Goals (current goals can now be found in the care plan section) Acute Rehab PT Goals Patient Stated Goal: to do exercises and walk PT Goal Formulation: With patient Time For Goal Achievement: 06/10/20 Potential to Achieve Goals: Fair Progress towards PT goals: Progressing toward goals    Frequency    Min 3X/week      PT Plan Current plan remains appropriate    Co-evaluation  AM-PAC PT "6 Clicks" Mobility   Outcome Measure  Help needed turning from your back to your side while in a flat bed without using bedrails?: A Little Help needed moving from lying on your back to sitting on the side of a flat bed without using bedrails?: A Little Help needed moving to and from a bed to a chair (including a wheelchair)?: A Little Help needed standing up from a chair using your arms (e.g., wheelchair or bedside chair)?: A Little Help needed to walk in hospital room?: A Lot Help needed climbing 3-5 steps with a railing? : A Lot 6  Click Score: 16    End of Session Equipment Utilized During Treatment: Gait belt Activity Tolerance: Patient tolerated treatment well Patient left: in chair;with call bell/phone within reach;with chair alarm set Nurse Communication: Mobility status PT Visit Diagnosis: Difficulty in walking, not elsewhere classified (R26.2);Unsteadiness on feet (R26.81)     Time: 6283-1517 PT Time Calculation (min) (ACUTE ONLY): 22 min  Charges:  $Therapeutic Activity: 8-22 mins                     Blondell Reveal Kistler PT 06/02/2020  Acute Rehabilitation Services Pager [phone] Office [phone]

## 2020-06-02 NOTE — Progress Notes (Signed)
Occupational Therapy Treatment Patient Details Name: Vanessa [name] MRN: [id number] DOB: 1955/08/24 Today's Date: 06/02/2020    History of present illness Vanessa [name] is an 64 y.o. female with PMH significant for recent diagnosis of endometrial CA, HTN, CKD 3, diet-controlled diabetes and recent admission for lower extremity cellulitis who was sent over from her rehab facility for significant increase in her vaginal bleeding   OT comments  Pt progressing towards acute OT goals. Completed BSC<>recliner at min guard level. Pt fatigued after taking pivotal steps. Provided reacher and instructed in use for LB dressing. Pt up in recliner at end of session. D/c recommendation remains appropriate.    Follow Up Recommendations  SNF;Home health OT    Equipment Recommendations       Recommendations for Other Services      Precautions / Restrictions Precautions Precautions: Fall Precaution Comments: vaginal bleeding Restrictions Weight Bearing Restrictions: No       Mobility Bed Mobility               General bed mobility comments: up in chair  Transfers Overall transfer level: Needs assistance Equipment used: Rolling walker (2 wheeled) Transfers: Sit to/from Stand Sit to Stand: Min guard         General transfer comment: from [facility] to recliner. min guard for safety. utilized rw.    Balance Overall balance assessment: Needs assistance Sitting-balance support: No upper extremity supported;Feet supported Sitting balance-Leahy Scale: Good     Standing balance support: During functional activity;Bilateral upper extremity supported Standing balance-Leahy Scale: Fair                             ADL either performed or assessed with clinical judgement   ADL Overall ADL's : Needs assistance/impaired                         Toilet Transfer: Min Advice worker Details (indicate cue type and reason): pt completed BSC to  recliner with pivotal steps at min guard + rw. Toileting- Clothing Manipulation and Hygiene: Moderate assistance;Sit to/from stand Toileting - Clothing Manipulation Details (indicate cue type and reason): assist for pericare tasks in standing     Functional mobility during ADLs: Min guard;Rolling walker General ADL Comments: Provided reacher and instructed in use with LB dressing.      Vision       Perception     Praxis      Cognition Arousal/Alertness: Awake/alert Behavior During Therapy: WFL for tasks assessed/performed Overall Cognitive Status: Within Functional Limits for tasks assessed                                 General Comments: pt slightly slow to respond to cues, but pleasant and oriented to self, place and situation        Exercises     Shoulder Instructions       General Comments Educated on ROM exercises for edema control.     Pertinent Vitals/ Pain       Pain Assessment: No/denies pain  Home Living                                          Prior Functioning/Environment  Frequency  Min 2X/week        Progress Toward Goals  OT Goals(current goals can now be found in the care plan section)  Progress towards OT goals: Progressing toward goals  Acute Rehab OT Goals Patient Stated Goal: To get stronger OT Goal Formulation: With patient Time For Goal Achievement: 06/14/20 Potential to Achieve Goals: Good ADL Goals Pt Will Perform Lower Body Bathing: with supervision;sit to/from stand;with adaptive equipment Pt Will Perform Lower Body Dressing: with supervision;sit to/from stand;with adaptive equipment Pt Will Transfer to Toilet: with min guard assist;ambulating;bedside commode Pt Will Perform Toileting - Clothing Manipulation and hygiene: with min guard assist;sit to/from stand Pt/caregiver will Perform Home Exercise Program: Increased strength;Right Upper extremity;Left upper extremity;With  written HEP provided;With theraband;With theraputty;Independently Additional ADL Goal #1: Patient will stand at sink to perform grooming task as evidence of improving activity tolerance  Plan Discharge plan remains appropriate    Co-evaluation                 AM-PAC OT "6 Clicks" Daily Activity     Outcome Measure   Help from another person eating meals?: A Little Help from another person taking care of personal grooming?: A Little Help from another person toileting, which includes using toliet, bedpan, or urinal?: A Little Help from another person bathing (including washing, rinsing, drying)?: A Lot Help from another person to put on and taking off regular upper body clothing?: A Little Help from another person to put on and taking off regular lower body clothing?: A Little 6 Click Score: 17    End of Session Equipment Utilized During Treatment: Rolling walker  OT Visit Diagnosis: Other abnormalities of gait and mobility (R26.89);Muscle weakness (generalized) (M62.81)   Activity Tolerance Patient tolerated treatment well   Patient Left in chair;with call bell/phone within reach;with nursing/sitter in room;with chair alarm set   Nurse Communication Other (comment) (nurse present throughout)        Time: 8366-2947 OT Time Calculation (min): 11 min  Charges: OT General Charges $OT Visit: 1 Visit OT Treatments $Self Care/Home Management : 8-22 mins  Tyrone Schimke, OT Acute Rehabilitation Services Pager: [phone] Office: [phone]    Hortencia Pilar 06/02/2020, 11:00 AM

## 2020-06-02 NOTE — Progress Notes (Addendum)
PROGRESS NOTE    Vanessa [name]  LNL:[id number] DOB: 29-Jan-1956 DOA: 05/24/2020 PCP: Pcp, No   Brief Narrative:  HPI per Dr. Bonnell Public on 05/24/20 Vanessa [name] is an 64 y.o. female with PMH significant for recent diagnosis of endometrial CA, HTN, CKD 3, diet-controlled diabetes and recent admission for lower extremity cellulitis who was sent over from her rehab facility for significant increase in her vaginal bleeding.  Patient apparently has chronic vaginal bleeding however over the past couple of days apparently she has been discharging a lot of clots and much more bleeding than usual.  Patient herself is unaware of this as she spends most of her day in bed and wears a diaper.  She was alarmed however when she saw how much blood there was in her diaper earlier today.  She agreed with staff at the rehab facility that she needed to come to the ED.  Patient admits to feeling somewhat weak.  She notes "I do not feel good but I do not feel as bad as I could".  She denies any chest pain.  She does admit to having some shortness of breath earlier today however that improved with sitting up.  She denies general orthopnea or PND usually.  Patient admits she does not exert herself very much.  Denies any dizziness while lying in bed.  Patient specifically denies history of congestive heart failure or pulmonary edema.  Denies known history of CAD or MI.  Echocardiogram done March 2021 shows EF of 50 to 60% with grade 2 diastolic dysfunction.  She does have moderately elevated PA pressures with peak pressure of 57.  ED Course:  The patient was noted to be afebrile and normotensive.  Laboratory work-up was notable for hemoglobin of 6, down from 8.1 on 04/30/2020.  She has known CKD and her creatinine is at baseline at 2.5.  Patient was discussed with GYN in the ED, they recommended admission to hospitalist with comanagement by them.  They will manage her vaginal bleeding.  Patient was started on  transfusion 2 unit PRBC.  **Interim History  She was Admitted with symptomatic anemia, vaginal bleeding due to endometrial cancer.  Gynecology oncology consulted as well as radiation oncology.  Patient was transfused 4 units total as her hemoglobin dropped again to 6.8 the day before yesterday (2 units given).  She was transferred to [facility] for radiation therapy.  She has undergone 5 radiation treatment and has been transfused a total of 5 units of PRBCs.  Unfortunately her insurance has denied authorization for SNF and when I called for peer-to-peer it would not let me connect to anybody live and I did not get to discuss the case with anyone today.  05/30/2020 Her renal function is slightly improved compared to yesterday but blood count is only minimally improved from the 1 unit of PRBCs.  We will continue monitor if she continues to have some bleeding but she states this is slow down.  CBGs ranging from 103-1 60.  Overall she is feeling a little bit better today but her Foley came out today  05/31/2020 She feels a little bit better today and not as weak.  PT OT still working with the patient.  Underwent radiation again today and this is day 5 of her treatments.  Thinks her vaginal bleeding is slowing down and Foley catheter removed yesterday.  06/01/20 patient is doing better today and thinks that her vaginal bleeding is minimal.  She has unsafe discharge disposition at  home as we cannot reach family and unfortunately she got denied for SNF.  She cannot go home as she will require 24-hour supervision and unable to reach out to the husband.  We do not have the patient's children's number.  Also she will need transportation for her radiation treatment daily because of the holiday week this week she is going to go treatment until the 30th  06/02/20 TOC able to get in touch with the patient's husband after wellness check was completed.  PT recommending SNF level care still but unfortunately she  cannot go to skilled nursing facility and will need maximal home health at discharge but only able to walk 4 feet with PT and is still don't feel it safe for her to be D/C'd home yet.  Patient does not have transportation for her daily radiation treatments and today is day 7 out of 10.  Bleeding is minimal now and she will need adequate home health resources as well as transportation for her radiation treatment started to be ending 06/09/2020 but unclear if her husband can take care of her 24/7.  She is able to ambulate 4 feet with a rolling walker but distance is limited by fatigue.   Assessment & Plan:   Principal Problem:   Abnormal uterine bleeding due to primary malignant neoplasm of endometrium (HCC) Active Problems:   DM type 2 (diabetes mellitus, type 2) (HCC)   Dyslipidemia   Symptomatic anemia   Benign hypertension with CKD (chronic kidney disease) stage III (HCC)   Endometrial cancer (HCC)   Iron deficiency anemia due to chronic blood loss   Edema  Symptomatic anemia/ Acute on chronic normocytic anemia/ Vaginal bleeding secondary to endometrial cancer, improved -Patient presented with shortness of breath and hemoglobin of 6 (baseline 7-8) -Was transfused 2 units PRBC on admission. -Hemoglobin/marker is now stable at 8.4/27.2 -Vaginal bleeding is improving -S/p total 5 units of pRBC's -Continue to Hold Aspirin -Continue iron supplementation with Ferrous Sulfate 325 mg po BID -Patient has an IUD however tells me that she was unable to make it to her chemo/radiation treatments -Gyn Onc consulted and appreciated and recommended radiation oncology consultation -Radiation oncology recommended 10 radiation treatments and transfer to [facility]; Today is Day 7 of Rad Onc Treatment and she will continue to get treatment until the 30th given that this is a holiday week -Continue to monitor CBC carefully -She was deemed to have a unsafe discharge disposition at home yesterday as we could  not reach family and unfortunately she got denied for SNF.  She will require 24-hour supervision and we were unable to reach out to the husband but finally agreeable to touch base with her husband but don't know if he can take care of her level of needs given his own medical issues.  We do not have the patient's children's number.  Also she will need transportation for her radiation treatment daily because of the holiday week this week she is going to go treatment until the 30th -Currently having TOC arrange for transportation for her radiation and anticipating discharging in the next 24 hours if this can be set up -We will reach out to Gyn-Oncology to make sure they don't have any other recommendations prior to [place]; she was advised to have treatment recommendations for radiation in August 2021 but failed to show 5 different appointments that were made with Dr. Dwyane Dee due to transportation issues. Dr. Delsa Sale recommending no additional GYN recommendations -Will need to make sure discharge  transportation issues are settled before we can discharge her home safely given that her car is in the shop -Per Dr. Denman George she is not a candidate for hysterectomy and has expelled her IUD.  She do not feel that she will have much response to the replaced IUD or oral progestins  Hemorrhoids -We will continue HC suppositories -There is some concern that hemorrhoids may be contributing to bleeding -We will need to follow this closely, will ask nurses to assess when they change her.  Abnormal EKG Regular narrow complex bradycardia read as sinus by EKG machine  P wave and T waves are diffusely flattened so is difficult for me to confirm that QTC is elevated at 507, will hold Levaquin This is without any change from previous EKG from last month.  Chronic Kidney Disease, Stage IV, stable and improving  -BUN/Creatinine appears to be at baseline and actually improved -BUN/Cr went frm 37/2.72 ->52/2.03  now -Patient had a Foley catheter but this got dislodged and will hold off replacing for now -Avoid further Nephrotoxic Medications, Contrast Dyes, Hypotension and Renally adjust Medications -Repeat CMP in the AM   Essential HTN -Patient is actually hypertensive despite ongoing vaginal bleeding -We will Continue Amlodipine 10, Conidine 0.2 mg twice daily, Carvedilol 6.25 mg po BID and Hydralazine 100 mg q8h per home doses -We will also continue Furosemide 60 mg po BID and Metolazone 5 given patient is to receive 2 units of blood and she has significant lower extremity edema -BP was elevated at 139/64 this AM -May need IV Hydralazine  -Continue to Monitor BP per protocol   Diet Controlled DM2 -Patient does not appear to be on any antidiabetic medicine -Last HbA1c was 5.9 on 04/22/20 -Will place patient on carb controlled diet and check fingersticks  -SSI not ordered -Continue to monitor blood sugars carefully; CBG's ranging from 104-144 -Blood sugars ranging from 105-141 on daily BMPs  Stage II Sacral Ulcer -Present on Admission -Wound care consulted  Deconditioning -PT consulted for further evaluation and recommending SNF but unfortunately she was denied Authorization and When I called for the Peer-to-Peer I was not connected to anyone live and was on hold for 20 minutes -PT/OT to continue to work with the patient and still recommending SNF level of care but if not 24 hour Supervision/Assistance -Will need re-evaluation prior to D/C and it is still recommending SNF level of care however she will need to go home with home health maximal therapy once she is able to do more; She was an unsafe discharge disposition yesterday and still feel that it is unsafe even though we have reached the husband, she will need 24-hour supervision and will need to ensure she can come back for Radiation Treatments to be completed and do not know if husband can provide the 24/7 Level of Supervision she will  need   Hypokalemia -Patient's K+ is now 3.5; Replete with po KCl 30 mEq twice daily x2 doses again -Check Mag Level and was 1.9 -Continue to Monitor and and Replete as Necessary -Repeat CMP in the AM   Levaquin use -Patient was started on a 7-day course of Levaquin 05/22/2020, unclear indication -Given her prolonged QTC, medication held -Currently no evidence of pneumonia or urinary tract infection  Leukopenia -Mild with a WBC of 3.8 -Continue to Monitor and Trend -Repeat CMP In the AM   Super Morbid Obesity -Complicates overall prognosis and care -Estimated body mass index is 52.51 kg/m as calculated from the following:   Height  as of this encounter: 4\' 11"  (1.499 m).   Weight as of this encounter: 117.9 kg. -Weight Loss and Dietary Counseling given   DVT prophylaxis: SCDs given Vaginal Bleeding  Code Status: FULL CODE  Family Communication: No family present at bedside  Disposition Plan: Pending further clinical improvement in her Vaginal Bleeding and Clearance by Gyn-Onc; Unfortunately denied for SNF so will likely need to go home with Home Health once she is getting stronger and we can verify if the Husband can give her 24/7 Level of Care that she needs. TOC filled out Medicaid Application for LTC   Status is: Inpatient  Remains inpatient appropriate because:Ongoing diagnostic testing needed not appropriate for outpatient work up, Unsafe d/c plan, IV treatments appropriate due to intensity of illness or inability to take PO and Inpatient level of care appropriate due to severity of illness   Dispo: The patient is from: Home              Anticipated d/c is to: SNF now she will need to go home eventually given that she was denied for SNF              Anticipated d/c date is: 1-2 days when she has a safe discharge disposition              Patient currently is not medically stable to d/c.  Consultants:   Gyn-Oncology   Radiation-Oncology    Procedures: Radiation    Antimicrobials:  Anti-infectives (From admission, onward)   Start     Dose/Rate Route Frequency Ordered Stop   05/25/20 1800  levofloxacin (LEVAQUIN) tablet 750 mg  Status:  Discontinued        750 mg Oral Every 48 hours 05/24/20 1549 05/24/20 1610       Subjective: Seen and Examined at bedside and her vaginal bleeding is now minimal.  Continues to have some itching but states is improving.  No nausea or vomiting.  Thinks she is getting a little bit stronger therapy but still fairly weak.  No lightheadedness or dizziness.  No other concerns or complaints at this time.  Objective: Vitals:   06/01/20 1841 06/01/20 1943 06/02/20 0619 06/02/20 1018  BP: (!) 159/75 129/65 (!) 151/68 139/64  Pulse:  67 65 67  Resp:  16 16   Temp:  97.9 F (36.6 C) 97.9 F (36.6 C)   TempSrc:  Oral Oral   SpO2:  94% 100%   Weight:      Height:        Intake/Output Summary (Last 24 hours) at 06/02/2020 1134 Last data filed at 06/02/2020 0930 Gross per 24 hour  Intake 560 ml  Output 2200 ml  Net -1640 ml   Filed Weights   05/24/20 1245  Weight: 117.9 kg   Examination: Physical Exam:  Constitutional: WN/WD super morbidly obese African-American female currently no acute distress sitting in a chair bedside appears calm and comfortable Eyes: Lids and conjunctivae normal, sclerae anicteric  ENMT: External Ears, Nose appear normal. Grossly normal hearing.  Has poor dentition Neck: Appears normal, supple, no cervical masses, normal ROM, no appreciable thyromegaly; no JVD Respiratory: Diminished to auscultation bilaterally, no wheezing, rales, rhonchi or crackles. Normal respiratory effort and patient is not tachypenic. No accessory muscle use.  Unlabored breathing Cardiovascular: RRR, no murmurs / rubs / gallops. S1 and S2 auscultated.  Has some mild to 1+ lower extremity edema with some lower extremity skin changes Abdomen: Soft, non-tender, distended secondary body habitus. Bowel sounds  positive.   GU: Deferred. Musculoskeletal: No clubbing / cyanosis of digits/nails. No joint deformity upper and lower extremities.  Skin: Has bilateral lower extremity skin changes are dry and slightly pruritic raised.  No induration; Warm and dry.  Neurologic: CN 2-12 grossly intact with no focal deficits. Romberg sign and cerebellar reflexes not assessed.  Psychiatric: Normal judgment and insight. Alert and oriented x 3. Normal mood and appropriate affect.   Data Reviewed: I have personally reviewed following labs and imaging studies  CBC: Recent Labs  Lab 05/29/20 0733 05/30/20 0920 05/31/20 0619 06/01/20 0558 06/02/20 0940  WBC 5.5 4.9 3.9* 3.3* 3.8*  NEUTROABS 4.1 3.7 2.7 2.5 2.9  HGB 7.1* 7.8* 8.0* 8.2* 8.4*  HCT 21.8* 24.1* 25.4* 26.1* 27.2*  MCV 92.0 92.0 96.2 96.3 96.8  PLT 203 194 167 179 606   Basic Metabolic Panel: Recent Labs  Lab 05/29/20 0733 05/30/20 0920 05/31/20 0619 06/01/20 0558 06/02/20 0940  NA 141 141 142 140 140  K 3.7 3.5 3.3* 3.3* 3.5  CL 99 97* 96* 95* 95*  CO2 32 33* 33* 34* 34*  GLUCOSE 120* 105* 123* 129* 169*  BUN 42* 46* 48* 50* 52*  CREATININE 2.47* 2.22* 2.17* 2.19* 2.03*  CALCIUM 8.3* 8.3* 8.3* 8.4* 8.4*  MG 2.0 1.9 1.9 2.0 1.9  PHOS 4.4 4.2 4.3 4.1 4.1   GFR: Estimated Creatinine Clearance: 31.9 mL/min (A) (by C-G formula based on SCr of 2.03 mg/dL (H)). Liver Function Tests: Recent Labs  Lab 05/29/20 0733 05/30/20 0920 05/31/20 0619 06/01/20 0558 06/02/20 0940  AST 17 18 17 18 17   ALT 8 10 9 11 11   ALKPHOS 90 91 86 84 79  BILITOT 0.7 0.8 0.7 0.7 0.6  PROT 6.1* 6.2* 6.0* 6.5 6.5  ALBUMIN 2.5* 2.6* 2.5* 2.6* 2.7*   No results for input(s): LIPASE, AMYLASE in the last 168 hours. No results for input(s): AMMONIA in the last 168 hours. Coagulation Profile: No results for input(s): INR, PROTIME in the last 168 hours. Cardiac Enzymes: No results for input(s): CKTOTAL, CKMB, CKMBINDEX, TROPONINI in the last 168 hours. BNP (last 3  results) No results for input(s): PROBNP in the last 8760 hours. HbA1C: No results for input(s): HGBA1C in the last 72 hours. CBG: Recent Labs  Lab 06/01/20 0725 06/01/20 1215 06/01/20 1724 06/01/20 2148 06/02/20 0803  GLUCAP 114* 144* 120* 116* 104*   Lipid Profile: No results for input(s): CHOL, HDL, LDLCALC, TRIG, CHOLHDL, LDLDIRECT in the last 72 hours. Thyroid Function Tests: No results for input(s): TSH, T4TOTAL, FREET4, T3FREE, THYROIDAB in the last 72 hours. Anemia Panel: No results for input(s): VITAMINB12, FOLATE, FERRITIN, TIBC, IRON, RETICCTPCT in the last 72 hours. Sepsis Labs: No results for input(s): PROCALCITON, LATICACIDVEN in the last 168 hours.  Recent Results (from the past 240 hour(s))  Respiratory Panel by RT PCR (Flu A&B, Covid) - Nasopharyngeal Swab     Status: None   Collection Time: 05/24/20  3:38 PM   Specimen: Nasopharyngeal Swab  Result Value Ref Range Status   SARS Coronavirus 2 by RT PCR NEGATIVE NEGATIVE Final    Comment: (NOTE) SARS-CoV-2 target nucleic acids are NOT DETECTED.  The SARS-CoV-2 RNA is generally detectable in upper respiratoy specimens during the acute phase of infection. The lowest concentration of SARS-CoV-2 viral copies this assay can detect is 131 copies/mL. A negative result does not preclude SARS-Cov-2 infection and should not be used as the sole basis for treatment or other patient management decisions. A negative  result may occur with  improper specimen collection/handling, submission of specimen other than nasopharyngeal swab, presence of viral mutation(s) within the areas targeted by this assay, and inadequate number of viral copies (<131 copies/mL). A negative result must be combined with clinical observations, patient history, and epidemiological information. The expected result is Negative.  Fact Sheet for Patients:  PinkCheek.be  Fact Sheet for Healthcare Providers:    GravelBags.it  This test is no t yet approved or cleared by the [place] FDA and  has been authorized for detection and/or diagnosis of SARS-CoV-2 by FDA under an Emergency Use Authorization (EUA). This EUA will remain  in effect (meaning this test can be used) for the duration of the COVID-19 declaration under Section 564(b)(1) of the Act, 21 U.S.C. section 360bbb-3(b)(1), unless the authorization is terminated or revoked sooner.     Influenza A by PCR NEGATIVE NEGATIVE Final   Influenza B by PCR NEGATIVE NEGATIVE Final    Comment: (NOTE) The Xpert Xpress SARS-CoV-2/FLU/RSV assay is intended as an aid in  the diagnosis of influenza from Nasopharyngeal swab specimens and  should not be used as a sole basis for treatment. Nasal washings and  aspirates are unacceptable for Xpert Xpress SARS-CoV-2/FLU/RSV  testing.  Fact Sheet for Patients: PinkCheek.be  Fact Sheet for Healthcare Providers: GravelBags.it  This test is not yet approved or cleared by the [place] FDA and  has been authorized for detection and/or diagnosis of SARS-CoV-2 by  FDA under an Emergency Use Authorization (EUA). This EUA will remain  in effect (meaning this test can be used) for the duration of the  Covid-19 declaration under Section 564(b)(1) of the Act, 21  U.S.C. section 360bbb-3(b)(1), unless the authorization is  terminated or revoked. Performed at [facility], [place] [address] [place], [place] [address]      RN Pressure Injury Documentation: Pressure Injury 05/29/20 Thigh Posterior;Proximal;Right Stage 2 -  Partial thickness loss of dermis presenting as a shallow open injury with a red, pink wound bed without slough. 3 small open areas (Active)  05/29/20 2000  Location: Thigh  Location Orientation: Posterior;Proximal;Right  Staging: Stage 2 -  Partial thickness loss of dermis presenting as a  shallow open injury with a red, pink wound bed without slough.  Wound Description (Comments): 3 small open areas  Present on Admission: Yes   Estimated body mass index is 52.51 kg/m as calculated from the following:   Height as of this encounter: 4\' 11"  (1.499 m).   Weight as of this encounter: 117.9 kg.  Malnutrition Type:   Malnutrition Characteristics:   Nutrition Interventions:    Radiology Studies: No results found.  Scheduled Meds: . sodium chloride   Intravenous Once  . amLODipine  10 mg Oral Daily  . carvedilol  6.25 mg Oral BID WC  . cloNIDine  0.2 mg Oral BID WC  . diphenhydrAMINE  25 mg Oral TID  . ferrous sulfate  325 mg Oral BID WC  . furosemide  60 mg Oral BID  . hydrALAZINE  100 mg Oral Q8H  . hydrocerin  1 application Topical Daily  . hydrocortisone  25 mg Rectal Daily  . metolazone  5 mg Oral Daily  . nutrition supplement (JUVEN)  1 packet Oral BID BM  . polyethylene glycol  17 g Oral Daily  . sodium bicarbonate  1,300 mg Oral BID WC   Continuous Infusions:   LOS: 8 days   Kerney Elbe, DO [facility] PAGER is on  AMION  If 7PM-7AM, please contact night-coverage [email or web address]

## 2020-06-02 NOTE — TOC Progression Note (Signed)
Transition of Care [facility]) - Progression Note    Patient Details  Name: Vanessa [name] MRN: [id number] Date of Birth: 04-06-1956  Transition of Care [facility]) CM/SW Contact  Saburo Luger, Marjie Skiff, RN Phone Number: 06/02/2020, 10:53 AM  Clinical Narrative:    Wellness check was completed by [employer] yesterday. [employer] called this CM and reported that husband appeared fine and said that he didn't get any of the messages that I left him. [employer] also states that there appears to be some sort of argument going on between pt and husband. This CM then contacted husband to alert him that pt would eventually need to come back home because her insurance denied her going to SNF. Husband sounded frustrated but states he understands.   Financial counselor screened pt for Medicaid eligibility at this CM's request. Forms for pt to sign were sent to this CM. Pt signed forms and they were emailed back to Development worker, community. Hopeful that pt can get Medicaid so can potentially get LTC in the future. In the meantime pt has improved with PT and states she is feeling stronger. TOC will continue to follow.   Expected Discharge Plan: Skilled Nursing Facility Barriers to Discharge: SNF Pending bed offer, Continued Medical Work up  Expected Discharge Plan and Services Expected Discharge Plan: [place] arrangements for the past 2 months: [place], Apartment                    Social Determinants of Health (SDOH) Interventions    Readmission Risk Interventions Readmission Risk Prevention Plan 11/08/2019 10/21/2019 09/25/2019  Transportation Screening Complete Complete Complete  PCP or Specialist Appt within 3-5 Days - - Not Complete  Not Complete comments - - plan for SNF  HRI or [place] - - Complete  Social Work Consult for [place] Planning/Counseling - - Complete  Palliative Care Screening - - Not Applicable  Medication Review Press photographer) Complete Referral to  Pharmacy Referral to Pharmacy  PCP or Specialist appointment within 3-5 days of discharge Complete Complete -  HRI or Home Care Consult Complete (No Data) -  SW Recovery Care/Counseling Consult Complete Complete -  Palliative Care Screening Not Applicable Not Applicable -  Skilled Nursing Facility Complete Complete -  Some recent data might be hidden

## 2020-06-02 NOTE — Progress Notes (Signed)
[facility] Therapy Treatment Record Phone [phone]   Radiation Therapy was administered to Vanessa [name] on: 06/02/2020  1:30 PM and was treatment # 7 out of a planned course of 10 treatments.  Radiation Treatment  1). Beam photons with 6-10 energy  2). Brachytherapy None  3). Stereotactic Radiosurgery None  4). Other Radiation None     Jakeob Tullis A Micca Matura, RT (T)

## 2020-06-03 ENCOUNTER — Ambulatory Visit
Admit: 2020-06-03 | Discharge: 2020-06-03 | Disposition: A | Discharge status: Home / Self Care | Payer: Medicare Other | Attending: Radiation Oncology | Admitting: Radiation Oncology

## 2020-06-03 ENCOUNTER — Institutional Professional Consult (permissible substitution): Payer: Medicare Other | Admitting: Radiation Oncology

## 2020-06-03 DIAGNOSIS — C541 Malignant neoplasm of endometrium: Secondary | ICD-10-CM | POA: Diagnosis not present

## 2020-06-03 DIAGNOSIS — N939 Abnormal uterine and vaginal bleeding, unspecified: Secondary | ICD-10-CM | POA: Diagnosis not present

## 2020-06-03 LAB — CBC WITH DIFFERENTIAL/PLATELET
Abs Immature Granulocytes: 0.03 10*3/uL (ref 0.00–0.07)
Basophils Absolute: 0 10*3/uL (ref 0.0–0.1)
Basophils Relative: 1 %
Eosinophils Absolute: 0.1 10*3/uL (ref 0.0–0.5)
Eosinophils Relative: 4 %
HCT: 25.8 % — ABNORMAL LOW (ref 36.0–46.0)
Hemoglobin: 7.9 g/dL — ABNORMAL LOW (ref 12.0–15.0)
Immature Granulocytes: 1 %
Lymphocytes Relative: 8 %
Lymphs Abs: 0.3 10*3/uL — ABNORMAL LOW (ref 0.7–4.0)
MCH: 29.6 pg (ref 26.0–34.0)
MCHC: 30.6 g/dL (ref 30.0–36.0)
MCV: 96.6 fL (ref 80.0–100.0)
Monocytes Absolute: 0.4 10*3/uL (ref 0.1–1.0)
Monocytes Relative: 10 %
Neutro Abs: 2.6 10*3/uL (ref 1.7–7.7)
Neutrophils Relative %: 76 %
Platelets: 178 10*3/uL (ref 150–400)
RBC: 2.67 MIL/uL — ABNORMAL LOW (ref 3.87–5.11)
RDW: 15.9 % — ABNORMAL HIGH (ref 11.5–15.5)
WBC: 3.4 10*3/uL — ABNORMAL LOW (ref 4.0–10.5)
nRBC: 0 % (ref 0.0–0.2)

## 2020-06-03 LAB — COMPREHENSIVE METABOLIC PANEL
ALT: 9 U/L (ref 0–44)
AST: 15 U/L (ref 15–41)
Albumin: 2.7 g/dL — ABNORMAL LOW (ref 3.5–5.0)
Alkaline Phosphatase: 83 U/L (ref 38–126)
Anion gap: 7 (ref 5–15)
BUN: 50 mg/dL — ABNORMAL HIGH (ref 8–23)
CO2: 38 mmol/L — ABNORMAL HIGH (ref 22–32)
Calcium: 8.5 mg/dL — ABNORMAL LOW (ref 8.9–10.3)
Chloride: 95 mmol/L — ABNORMAL LOW (ref 98–111)
Creatinine, Ser: 2.45 mg/dL — ABNORMAL HIGH (ref 0.44–1.00)
GFR, Estimated: 21 mL/min — ABNORMAL LOW (ref >60)
Glucose, Bld: 137 mg/dL — ABNORMAL HIGH (ref 70–99)
Potassium: 3.3 mmol/L — ABNORMAL LOW (ref 3.5–5.1)
Sodium: 140 mmol/L (ref 135–145)
Total Bilirubin: 0.6 mg/dL (ref 0.3–1.2)
Total Protein: 6.3 g/dL — ABNORMAL LOW (ref 6.5–8.1)

## 2020-06-03 LAB — GLUCOSE, CAPILLARY
Glucose-Capillary: 134 mg/dL — ABNORMAL HIGH (ref 70–99)
Glucose-Capillary: 166 mg/dL — ABNORMAL HIGH (ref 70–99)
Glucose-Capillary: 160 mg/dL — ABNORMAL HIGH (ref 70–99)
Glucose-Capillary: 188 mg/dL — ABNORMAL HIGH (ref 70–99)

## 2020-06-03 LAB — MAGNESIUM: Magnesium: 1.9 mg/dL (ref 1.7–2.4)

## 2020-06-03 LAB — PHOSPHORUS: Phosphorus: 3.7 mg/dL (ref 2.5–4.6)

## 2020-06-03 NOTE — Progress Notes (Signed)
OT Cancellation Note  Patient Details Name: Vanessa [name] MRN: [id number] DOB: 1955/11/26   Cancelled Treatment:    Reason Eval/Treat Not Completed: Patient at procedure or test/ unavailable. Upon arrival patient being transported off floor. Will re-attempt as schedule permits.  Delbert Phenix OT OT pager: [place] 06/03/2020, 1:44 PM

## 2020-06-03 NOTE — Progress Notes (Signed)
PROGRESS NOTE    Vanessa [name]  ACZ:[id number] DOB: [birth year] DOA: 05/24/2020 PCP: Pcp, No   Brief Narrative:  HPI per Dr. Bonnell Public on 05/24/20 Vanessa [name] is an 64 y.o. female with PMH significant for recent diagnosis of endometrial CA, HTN, CKD 3, diet-controlled diabetes and recent admission for lower extremity cellulitis who was sent over from her rehab facility for significant increase in her vaginal bleeding.  Patient apparently has chronic vaginal bleeding however over the past couple of days apparently she has been discharging a lot of clots and much more bleeding than usual.  Patient herself is unaware of this as she spends most of her day in bed and wears a diaper.  She was alarmed however when she saw how much blood there was in her diaper earlier today.  She agreed with staff at the rehab facility that she needed to come to the ED. Patient was discussed with GYN in the ED, they recommended admission to hospitalist with comanagement by them.  They will manage her vaginal bleeding.  Interim History  She was Admitted with symptomatic anemia, vaginal bleeding due to endometrial cancer.  Gynecology oncology consulted as well as radiation oncology.  Patient was transfused 4 units total as her hemoglobin dropped again to 6.8 the day before yesterday (2 units given).  She was transferred to [facility] for radiation therapy.  She has undergone 5 radiation treatment and has been transfused a total of 5 units of PRBCs.  Unfortunately her insurance has denied authorization for SNF and when I called for peer-to-peer it would not let me connect to anybody live and I did not get to discuss the case with anyone today.  05/30/2020 Her renal function is slightly improved compared to yesterday but blood count is only minimally improved from the 1 unit of PRBCs.  We will continue monitor if she continues to have some bleeding but she states this is slow down.  CBGs ranging from 103-1 60.   Overall she is feeling a little bit better today but her Foley came out today  05/31/2020 She feels a little bit better today and not as weak.  PT OT still working with the patient.  Underwent radiation again today and this is day 5 of her treatments.  Thinks her vaginal bleeding is slowing down and Foley catheter removed yesterday.  06/01/20 patient is doing better today and thinks that her vaginal bleeding is minimal.  She has unsafe discharge disposition at home as we cannot reach family and unfortunately she got denied for SNF.  She cannot go home as she will require 24-hour supervision and unable to reach out to the husband.  We do not have the patient's children's number.  Also she will need transportation for her radiation treatment daily because of the holiday week this week she is going to go treatment until the 30th  11/23 - present TOC able to get in touch with the patient's husband after wellness check was completed.  PT recommending SNF level care still but unfortunately she cannot go to skilled nursing facility and will need maximal home health at discharge but only able to walk 4 feet with PT and is still don't feel it safe for her to be D/C'd home yet.  Patient does not have transportation for her daily radiation treatments and today is day 7 out of 10.  Bleeding is minimal now and she will need adequate home health resources as well as transportation for her radiation treatment  started to be ending 06/09/2020 but unclear if her husband can take care of her 24/7.  She is able to ambulate 4 feet with a rolling walker but distance is limited by fatigue.  Assessment & Plan:   Principal Problem:   Abnormal uterine bleeding due to primary malignant neoplasm of endometrium (HCC) Active Problems:   DM type 2 (diabetes mellitus, type 2) (HCC)   Dyslipidemia   Symptomatic anemia   Benign hypertension with CKD (chronic kidney disease) stage III (HCC)   Endometrial cancer (HCC)   Iron  deficiency anemia due to chronic blood loss   Edema  Symptomatic anemia/ Acute on chronic normocytic anemia/ Vaginal bleeding secondary to endometrial cancer, improved -Vaginal bleeding is slowly resolving/improving -S/p total 5 units of PRBC's since admission -Continue to Hold Aspirin -Continue iron supplementation with Ferrous Sulfate 325 mg po BID -Gyn Onc and radiation oncology consultations following along - appreciate insight and recommendations -Radiation oncology recommended 10 radiation treatments and transfer to [facility]; last day of therapy 11/30th -Unfortunately she got denied for SNF and due to transport issues cannot get to radiation from home. -We will reach out to Gyn-Oncology to make sure they don't have any other recommendations prior to [place]; she was advised to have treatment recommendations for radiation in August 2021 but failed to show 5 different appointments that were made with Dr. Dwyane Dee due to transportation issues. Dr. Delsa Sale recommending no additional GYN recommendations -Per Dr. Denman George she is not a candidate for hysterectomy and has expelled her IUD.  She do not feel that she will have much response to the replaced IUD or oral progestins  Hemorrhoids -We will continue HC suppositories -There is some concern that hemorrhoids may be contributing to bleeding -We will need to follow this closely, will ask nurses to assess when they change her.  Abnormal EKG Regular narrow complex bradycardia read as sinus by EKG machine  P wave and T waves are diffusely flattened so is difficult for me to confirm that QTC is elevated at 507, will hold Levaquin This is without any change from previous EKG from last month.  Chronic Kidney Disease, Stage IV, stable and improving  -BUN/Creatinine appears to be at baseline and actually improved -BUN/Cr went frm 37/2.72 ->52/2.03 now -Patient had a Foley catheter but this got dislodged and will hold off replacing  for now -Avoid further Nephrotoxic Medications, Contrast Dyes, Hypotension and Renally adjust Medications -Repeat CMP in the AM   Essential HTN -Patient is actually hypertensive despite ongoing vaginal bleeding -We will Continue Amlodipine 10, Conidine 0.2 mg twice daily, Carvedilol 6.25 mg po BID and Hydralazine 100 mg q8h per home doses -We will also continue Furosemide 60 mg po BID and Metolazone 5 given patient is to receive 2 units of blood and she has significant lower extremity edema -BP was elevated at 139/64 this AM -May need IV Hydralazine  -Continue to Monitor BP per protocol   Diet Controlled DM2 -Patient does not appear to be on any antidiabetic medicine -Last HbA1c was 5.9 on 04/22/20 -Will place patient on carb controlled diet and check fingersticks  -SSI not ordered -Continue to monitor blood sugars carefully; CBG's ranging from 104-144 -Blood sugars ranging from 105-141 on daily BMPs  Stage II Sacral Ulcer -Present on Admission -Wound care consulted  Deconditioning, profound ambulatory dysfunction -PT consulted for further evaluation and recommending SNF but unfortunately she was denied Authorization and When I called for the Peer-to-Peer I was not connected to anyone live  and was on hold for 20 minutes -PT/OT to continue to work with the patient and still recommending SNF level of care but if not 24 hour Supervision/Assistance -Will need re-evaluation prior to D/C and it is still recommending SNF level of care however she will need to go home with home health maximal therapy once she is able to do more; She was an unsafe discharge disposition yesterday and still feel that it is unsafe even though we have reached the husband, she will need 24-hour supervision and will need to ensure she can come back for Radiation Treatments to be completed and do not know if husband can provide the 24/7 Level of Supervision she will need   Hypokalemia, resolved -Continue to Monitor  and and Replete as Necessary -Repeat CMP in the AM   Levaquin use -Patient was started on a 7-day course of Levaquin 05/22/2020, unclear indication -Given her prolonged QTC, medication held -Currently no evidence of pneumonia or urinary tract infection  Leukopenia -Mild with a WBC of 3.8 -Continue to Monitor and Trend -Repeat CMP In the AM   Super Morbid Obesity -Complicates overall prognosis and care -Estimated body mass index is 52.51 kg/m as calculated from the following:   Height as of this encounter: 4\' 11"  (1.499 m).   Weight as of this encounter: 117.9 kg. -Weight Loss and Dietary Counseling given   DVT prophylaxis: SCDs given Vaginal Bleeding  Code Status: FULL CODE  Family Communication: No family present at bedside  Disposition Plan: Pending further clinical improvement in her Vaginal Bleeding and Clearance by Gyn-Onc; Unfortunately denied for SNF so will likely need to go home with Home Health once she is getting stronger and we can verify if the Husband can give her 24/7 Level of Care that she needs. TOC filled out Medicaid Application for LTC   Status is: Inpatient  Remains inpatient appropriate because:Ongoing diagnostic testing needed not appropriate for outpatient work up, Unsafe d/c plan, IV treatments appropriate due to intensity of illness or inability to take PO and Inpatient level of care appropriate due to severity of illness   Dispo: The patient is from: Home              Anticipated d/c is to: SNF denied - dispo home pending CM/transport issues.              Anticipated d/c date is: 1-2 days when she has a safe discharge disposition              Patient currently is not medically stable to d/c.  Consultants:   Gyn-Oncology   Radiation-Oncology    Procedures: Radiation   Antimicrobials:  Anti-infectives (From admission, onward)   Start     Dose/Rate Route Frequency Ordered Stop   05/25/20 1800  levofloxacin (LEVAQUIN) tablet 750 mg  Status:   Discontinued        750 mg Oral Every 48 hours 05/24/20 1549 05/24/20 1610       Subjective: No acute issues or events overnight, denies any ongoing vaginal bleeding, nausea, vomiting, diarrhea, constipation, headache, fevers, chills.  Objective: Vitals:   06/02/20 1323 06/02/20 1739 06/02/20 2113 06/03/20 0635  BP: (!) 159/79 (!) 160/73 (!) 142/66 (!) 143/65  Pulse: 66 69 63 63  Resp: 15  14 14   Temp: 97.6 F (36.4 C)  97.7 F (36.5 C) 97.7 F (36.5 C)  TempSrc: Oral  Oral Oral  SpO2: 99%  98% 93%  Weight:      Height:  Intake/Output Summary (Last 24 hours) at 06/03/2020 0725 Last data filed at 06/03/2020 0641 Gross per 24 hour  Intake 240 ml  Output 2275 ml  Net -2035 ml   Filed Weights   05/24/20 1245  Weight: 117.9 kg   Examination: Physical Exam:  General:  Pleasantly resting in bed, No acute distress. HEENT:  Normocephalic atraumatic.  Sclerae nonicteric, noninjected.  Extraocular movements intact bilaterally. Neck:  Without mass or deformity.  Trachea is midline. Lungs:  Clear to auscultate bilaterally without rhonchi, wheeze, or rales. Heart:  Regular rate and rhythm.  Without murmurs, rubs, or gallops. Abdomen:  Soft, obese, nontender, nondistended.  Without guarding or rebound. Extremities: Without cyanosis, clubbing, edema, or obvious deformity. Vascular:  Dorsalis pedis and posterior tibial pulses palpable bilaterally. Skin:  Warm and dry, no erythema, no ulcerations.   Data Reviewed: I have personally reviewed following labs and imaging studies  CBC: Recent Labs  Lab 05/29/20 0733 05/30/20 0920 05/31/20 0619 06/01/20 0558 06/02/20 0940  WBC 5.5 4.9 3.9* 3.3* 3.8*  NEUTROABS 4.1 3.7 2.7 2.5 2.9  HGB 7.1* 7.8* 8.0* 8.2* 8.4*  HCT 21.8* 24.1* 25.4* 26.1* 27.2*  MCV 92.0 92.0 96.2 96.3 96.8  PLT 203 194 167 179 270   Basic Metabolic Panel: Recent Labs  Lab 05/29/20 0733 05/30/20 0920 05/31/20 0619 06/01/20 0558 06/02/20 0940  NA  141 141 142 140 140  K 3.7 3.5 3.3* 3.3* 3.5  CL 99 97* 96* 95* 95*  CO2 32 33* 33* 34* 34*  GLUCOSE 120* 105* 123* 129* 169*  BUN 42* 46* 48* 50* 52*  CREATININE 2.47* 2.22* 2.17* 2.19* 2.03*  CALCIUM 8.3* 8.3* 8.3* 8.4* 8.4*  MG 2.0 1.9 1.9 2.0 1.9  PHOS 4.4 4.2 4.3 4.1 4.1   GFR: Estimated Creatinine Clearance: 31.9 mL/min (A) (by C-G formula based on SCr of 2.03 mg/dL (H)). Liver Function Tests: Recent Labs  Lab 05/29/20 0733 05/30/20 0920 05/31/20 0619 06/01/20 0558 06/02/20 0940  AST 17 18 17 18 17   ALT 8 10 9 11 11   ALKPHOS 90 91 86 84 79  BILITOT 0.7 0.8 0.7 0.7 0.6  PROT 6.1* 6.2* 6.0* 6.5 6.5  ALBUMIN 2.5* 2.6* 2.5* 2.6* 2.7*   No results for input(s): LIPASE, AMYLASE in the last 168 hours. No results for input(s): AMMONIA in the last 168 hours. Coagulation Profile: No results for input(s): INR, PROTIME in the last 168 hours. Cardiac Enzymes: No results for input(s): CKTOTAL, CKMB, CKMBINDEX, TROPONINI in the last 168 hours. BNP (last 3 results) No results for input(s): PROBNP in the last 8760 hours. HbA1C: No results for input(s): HGBA1C in the last 72 hours. CBG: Recent Labs  Lab 06/01/20 2148 06/02/20 0803 06/02/20 1153 06/02/20 1721 06/02/20 2115  GLUCAP 116* 104* 192* 130* 143*   Lipid Profile: No results for input(s): CHOL, HDL, LDLCALC, TRIG, CHOLHDL, LDLDIRECT in the last 72 hours. Thyroid Function Tests: No results for input(s): TSH, T4TOTAL, FREET4, T3FREE, THYROIDAB in the last 72 hours. Anemia Panel: No results for input(s): VITAMINB12, FOLATE, FERRITIN, TIBC, IRON, RETICCTPCT in the last 72 hours. Sepsis Labs: No results for input(s): PROCALCITON, LATICACIDVEN in the last 168 hours.  Recent Results (from the past 240 hour(s))  Respiratory Panel by RT PCR (Flu A&B, Covid) - Nasopharyngeal Swab     Status: None   Collection Time: 05/24/20  3:38 PM   Specimen: Nasopharyngeal Swab  Result Value Ref Range Status   SARS Coronavirus 2 by  RT PCR NEGATIVE NEGATIVE  Final    Comment: (NOTE) SARS-CoV-2 target nucleic acids are NOT DETECTED.  The SARS-CoV-2 RNA is generally detectable in upper respiratoy specimens during the acute phase of infection. The lowest concentration of SARS-CoV-2 viral copies this assay can detect is 131 copies/mL. A negative result does not preclude SARS-Cov-2 infection and should not be used as the sole basis for treatment or other patient management decisions. A negative result may occur with  improper specimen collection/handling, submission of specimen other than nasopharyngeal swab, presence of viral mutation(s) within the areas targeted by this assay, and inadequate number of viral copies (<131 copies/mL). A negative result must be combined with clinical observations, patient history, and epidemiological information. The expected result is Negative.  Fact Sheet for Patients:  PinkCheek.be  Fact Sheet for Healthcare Providers:  GravelBags.it  This test is no t yet approved or cleared by the [place] FDA and  has been authorized for detection and/or diagnosis of SARS-CoV-2 by FDA under an Emergency Use Authorization (EUA). This EUA will remain  in effect (meaning this test can be used) for the duration of the COVID-19 declaration under Section 564(b)(1) of the Act, 21 U.S.C. section 360bbb-3(b)(1), unless the authorization is terminated or revoked sooner.     Influenza A by PCR NEGATIVE NEGATIVE Final   Influenza B by PCR NEGATIVE NEGATIVE Final    Comment: (NOTE) The Xpert Xpress SARS-CoV-2/FLU/RSV assay is intended as an aid in  the diagnosis of influenza from Nasopharyngeal swab specimens and  should not be used as a sole basis for treatment. Nasal washings and  aspirates are unacceptable for Xpert Xpress SARS-CoV-2/FLU/RSV  testing.  Fact Sheet for Patients: PinkCheek.be  Fact Sheet for  Healthcare Providers: GravelBags.it  This test is not yet approved or cleared by the [place] FDA and  has been authorized for detection and/or diagnosis of SARS-CoV-2 by  FDA under an Emergency Use Authorization (EUA). This EUA will remain  in effect (meaning this test can be used) for the duration of the  Covid-19 declaration under Section 564(b)(1) of the Act, 21  U.S.C. section 360bbb-3(b)(1), unless the authorization is  terminated or revoked. Performed at [facility], [place] [address] [place], [place] [address]      RN Pressure Injury Documentation: Pressure Injury 05/29/20 Thigh Posterior;Proximal;Right Stage 2 -  Partial thickness loss of dermis presenting as a shallow open injury with a red, pink wound bed without slough. 3 small open areas (Active)  05/29/20 2000  Location: Thigh  Location Orientation: Posterior;Proximal;Right  Staging: Stage 2 -  Partial thickness loss of dermis presenting as a shallow open injury with a red, pink wound bed without slough.  Wound Description (Comments): 3 small open areas  Present on Admission: Yes   Estimated body mass index is 52.51 kg/m as calculated from the following:   Height as of this encounter: 4\' 11"  (1.499 m).   Weight as of this encounter: 117.9 kg.  Malnutrition Type:   Malnutrition Characteristics:   Nutrition Interventions:    Radiology Studies: No results found.  Scheduled Meds: . sodium chloride   Intravenous Once  . amLODipine  10 mg Oral Daily  . carvedilol  6.25 mg Oral BID WC  . cloNIDine  0.2 mg Oral BID WC  . diphenhydrAMINE  25 mg Oral TID  . ferrous sulfate  325 mg Oral BID WC  . furosemide  60 mg Oral BID  . hydrALAZINE  100 mg Oral Q8H  . hydrocerin  1 application  Topical Daily  . hydrocortisone  25 mg Rectal Daily  . metolazone  5 mg Oral Daily  . nutrition supplement (JUVEN)  1 packet Oral BID BM  . polyethylene glycol  17 g Oral Daily  . sodium  bicarbonate  1,300 mg Oral BID WC   Continuous Infusions:   LOS: 9 days   Little Ishikawa, DO [facility] PAGER is on AMION  If 7PM-7AM, please contact night-coverage [email or web address]

## 2020-06-03 NOTE — Progress Notes (Signed)
[facility] Therapy Treatment Record Phone [phone]   Radiation Therapy was administered to Vanessa [name] on: 06/03/2020  3:13 PM and was treatment # 8 out of a planned course of 10 treatments.  Radiation Treatment  1). Beam photons with 6-10 energy  2). Brachytherapy None  3). Stereotactic Radiosurgery None  4). Other Radiation None     [name] A Larissa Pegg, RT (T)

## 2020-06-04 DIAGNOSIS — N939 Abnormal uterine and vaginal bleeding, unspecified: Secondary | ICD-10-CM | POA: Diagnosis not present

## 2020-06-04 DIAGNOSIS — C541 Malignant neoplasm of endometrium: Secondary | ICD-10-CM | POA: Diagnosis not present

## 2020-06-04 LAB — GLUCOSE, CAPILLARY
Glucose-Capillary: 114 mg/dL — ABNORMAL HIGH (ref 70–99)
Glucose-Capillary: 153 mg/dL — ABNORMAL HIGH (ref 70–99)
Glucose-Capillary: 176 mg/dL — ABNORMAL HIGH (ref 70–99)

## 2020-06-04 NOTE — Progress Notes (Signed)
PROGRESS NOTE    Vanessa [name]  GUY:[id number] DOB: 09-Feb-1956 DOA: 05/24/2020 PCP: Pcp, No   Brief Narrative:  HPI per Dr. Bonnell Public on 05/24/20 Vanessa [name] is an 65 y.o. female with PMH significant for recent diagnosis of endometrial CA, HTN, CKD 3, diet-controlled diabetes and recent admission for lower extremity cellulitis who was sent over from her rehab facility for significant increase in her vaginal bleeding.  Patient apparently has chronic vaginal bleeding however over the past couple of days apparently she has been discharging a lot of clots and much more bleeding than usual.  Patient herself is unaware of this as she spends most of her day in bed and wears a diaper.  She was alarmed however when she saw how much blood there was in her diaper earlier today.  She agreed with staff at the rehab facility that she needed to come to the ED. Patient was discussed with GYN in the ED, they recommended admission to hospitalist with comanagement by them.  They will manage her vaginal bleeding.  Interim History  She was Admitted with symptomatic anemia, vaginal bleeding due to endometrial cancer.  Gynecology oncology consulted as well as radiation oncology.  Patient was transfused 4 units total as her hemoglobin dropped again to 6.8 the day before yesterday (2 units given).  She was transferred to [facility] for radiation therapy.  She has undergone 5 radiation treatment and has been transfused a total of 5 units of PRBCs.  Unfortunately her insurance has denied authorization for SNF and when I called for peer-to-peer it would not let me connect to anybody live and I did not get to discuss the case with anyone today.  05/30/2020 Her renal function is slightly improved compared to yesterday but blood count is only minimally improved from the 1 unit of PRBCs.  We will continue monitor if she continues to have some bleeding but she states this is slow down.  CBGs ranging from 103-1 60.   Overall she is feeling a little bit better today but her Foley came out today  05/31/2020 She feels a little bit better today and not as weak.  PT OT still working with the patient.  Underwent radiation again today and this is day 5 of her treatments.  Thinks her vaginal bleeding is slowing down and Foley catheter removed yesterday.  06/01/20 patient is doing better today and thinks that her vaginal bleeding is minimal.  She has unsafe discharge disposition at home as we cannot reach family and unfortunately she got denied for SNF.  She cannot go home as she will require 24-hour supervision and unable to reach out to the husband.  We do not have the patient's children's number.  Also she will need transportation for her radiation treatment daily because of the holiday week this week she is going to go treatment until the 30th  11/23 - present TOC able to get in touch with the patient's husband after wellness check was completed.  PT recommending SNF level care still but unfortunately she cannot go to skilled nursing facility and will need maximal home health at discharge but poorly ambulatory with PT and continues to feel unsafe while attempting to ambulate. She does not feel safe returning home without more support in her current state. Patient also notes ongoing issues with transportation for her daily radiation treatments and will complete treatment with radiation on 06/09/2020 but unclear if her husband can take care of her 24/7.  She is able  to ambulate 4 feet with a rolling walker but distance is limited by fatigue.  Assessment & Plan:   Principal Problem:   Abnormal uterine bleeding due to primary malignant neoplasm of endometrium (HCC) Active Problems:   DM type 2 (diabetes mellitus, type 2) (HCC)   Dyslipidemia   Symptomatic anemia   Benign hypertension with CKD (chronic kidney disease) stage III (HCC)   Endometrial cancer (HCC)   Iron deficiency anemia due to chronic blood loss    Edema  Symptomatic anemia/ Acute on chronic normocytic anemia/ Vaginal bleeding secondary to endometrial cancer, improved -Vaginal bleeding is slowly resolving/improving -S/p total 5 units of PRBC's since admission -Continue to Hold Aspirin -Continue iron supplementation with Ferrous Sulfate 325 mg po BID -Gyn Onc and radiation oncology consultations following along - appreciate insight and recommendations -Radiation oncology recommended 10 radiation treatments and transfer to [facility]; last day of therapy 11/30th -Unfortunately she has been denied for SNF and due to transport issues cannot get to radiation from home. -We will reach out to Gyn-Oncology to make sure they don't have any other recommendations prior to [place]; she was advised to have treatment recommendations for radiation in August 2021 but failed to show 5 different appointments that were made with Dr. Dwyane Dee due to transportation issues. Dr. Delsa Sale recommending no additional GYN recommendations -Per Dr. Denman George she is not a candidate for hysterectomy and has expelled her IUD.  She do not feel that she will have much response to the replaced IUD or oral progestins  Hemorrhoids -We will continue HC suppositories -There is some concern that hemorrhoids may be contributing to bleeding -We will need to follow this closely, will ask nurses to assess when they change her.  Abnormal EKG Regular narrow complex bradycardia read as sinus by EKG machine  P wave and T waves are diffusely flattened so is difficult for me to confirm that QTC is elevated at 507, will hold Levaquin This is without any change from previous EKG from last month.  Chronic Kidney Disease, Stage IV, stable and improving  -BUN/Creatinine appears to be at baseline and actually improved -BUN/Cr went frm 37/2.72 ->52/2.03 now -Patient had a Foley catheter but this got dislodged and will hold off replacing for now -Avoid further Nephrotoxic  Medications, Contrast Dyes, Hypotension and Renally adjust Medications -Repeat CMP in the AM   Essential HTN -Patient is actually hypertensive despite ongoing vaginal bleeding -We will Continue Amlodipine 10, Conidine 0.2 mg twice daily, Carvedilol 6.25 mg po BID and Hydralazine 100 mg q8h per home doses -We will also continue Furosemide 60 mg po BID and Metolazone 5 given patient is to receive 2 units of blood and she has significant lower extremity edema -BP was elevated at 139/64 this AM -May need IV Hydralazine  -Continue to Monitor BP per protocol   Diet Controlled DM2 -Patient does not appear to be on any antidiabetic medicine -Last HbA1c was 5.9 on 04/22/20 -Will place patient on carb controlled diet and check fingersticks  -SSI not ordered -Continue to monitor blood sugars carefully; CBG's ranging from 104-144 -Blood sugars ranging from 105-141 on daily BMPs  Stage II Sacral Ulcer -Present on Admission -Wound care consulted  Deconditioning, profound ambulatory dysfunction -PT consulted for further evaluation and recommending SNF but unfortunately she was denied Authorization and When I called for the Peer-to-Peer I was not connected to anyone live and was on hold for 20 minutes -PT/OT to continue to work with the patient and still recommending SNF  level of care but if not 24 hour Supervision/Assistance -Will need re-evaluation prior to D/C and it is still recommending SNF level of care however she will need to go home with home health maximal therapy once she is able to do more; She was an unsafe discharge disposition yesterday and still feel that it is unsafe even though we have reached the husband, she will need 24-hour supervision and will need to ensure she can come back for Radiation Treatments to be completed and do not know if husband can provide the 24/7 Level of Supervision she will need   Hypokalemia, resolved -Continue to Monitor and and Replete as Necessary -Repeat  CMP in the AM   Levaquin use -Patient was started on a 7-day course of Levaquin 05/22/2020, unclear indication -Given her prolonged QTC, medication held -Currently no evidence of pneumonia or urinary tract infection  Leukopenia -Mild with a WBC of 3.8 -Continue to Monitor and Trend -Repeat CMP In the AM   Super Morbid Obesity -Complicates overall prognosis and care -Estimated body mass index is 52.51 kg/m as calculated from the following:   Height as of this encounter: 4\' 11"  (1.499 m).   Weight as of this encounter: 117.9 kg. -Weight Loss and Dietary Counseling given   DVT prophylaxis: SCDs given Vaginal Bleeding  Code Status: FULL CODE  Family Communication: No family present at bedside  Disposition Plan: Pending further clinical improvement in her Vaginal Bleeding and Clearance by Gyn-Onc; Unfortunately denied for SNF so will likely need to go home with Home Health once she is getting stronger and we can verify if the Husband can give her 24/7 Level of Care that she needs. TOC filled out Medicaid Application for LTC   Status is: Inpatient  Remains inpatient appropriate because:Ongoing diagnostic testing needed not appropriate for outpatient work up, Unsafe d/c plan, IV treatments appropriate due to intensity of illness or inability to take PO and Inpatient level of care appropriate due to severity of illness   Dispo: The patient is from: Home              Anticipated d/c is to: SNF denied - dispo home pending CM/transport issues.              Anticipated d/c date is: 1-2 days when she has a safe discharge disposition              Patient currently is not medically stable to d/c.  Consultants:   Gyn-Oncology   Radiation-Oncology    Procedures: Radiation   Antimicrobials:  Anti-infectives (From admission, onward)   Start     Dose/Rate Route Frequency Ordered Stop   05/25/20 1800  levofloxacin (LEVAQUIN) tablet 750 mg  Status:  Discontinued        750 mg Oral Every 48  hours 05/24/20 1549 05/24/20 1610       Subjective: No acute issues or events overnight, denies any ongoing vaginal bleeding, nausea, vomiting, diarrhea, constipation, headache, fevers, chills.  Objective: Vitals:   06/03/20 1431 06/03/20 1750 06/03/20 2014 06/04/20 0634  BP: (!) 171/74 (!) 153/74 (!) 147/70 (!) 154/69  Pulse: 67 70 65 66  Resp: 19  16 16   Temp: 97.6 F (36.4 C)  97.6 F (36.4 C) 97.7 F (36.5 C)  TempSrc: Oral  Oral Oral  SpO2: 97%  98% 93%  Weight:      Height:        Intake/Output Summary (Last 24 hours) at 06/04/2020 0736 Last data filed at 06/03/2020  1100 Gross per 24 hour  Intake 120 ml  Output 700 ml  Net -580 ml   Filed Weights   05/24/20 1245  Weight: 117.9 kg   Examination: Physical Exam:  General:  Pleasantly resting in bed, No acute distress. HEENT:  Normocephalic atraumatic.  Sclerae nonicteric, noninjected.  Extraocular movements intact bilaterally. Neck:  Without mass or deformity.  Trachea is midline. Lungs:  Clear to auscultate bilaterally without rhonchi, wheeze, or rales. Heart:  Regular rate and rhythm.  Without murmurs, rubs, or gallops. Abdomen:  Soft, obese, nontender, nondistended.  Without guarding or rebound. Extremities: Without cyanosis, clubbing, edema, or obvious deformity. Vascular:  Dorsalis pedis and posterior tibial pulses palpable bilaterally. Skin:  Warm and dry, no erythema, no ulcerations.   Data Reviewed: I have personally reviewed following labs and imaging studies  CBC: Recent Labs  Lab 05/30/20 0920 05/31/20 0619 06/01/20 0558 06/02/20 0940 06/03/20 0656  WBC 4.9 3.9* 3.3* 3.8* 3.4*  NEUTROABS 3.7 2.7 2.5 2.9 2.6  HGB 7.8* 8.0* 8.2* 8.4* 7.9*  HCT 24.1* 25.4* 26.1* 27.2* 25.8*  MCV 92.0 96.2 96.3 96.8 96.6  PLT 194 167 179 186 921   Basic Metabolic Panel: Recent Labs  Lab 05/30/20 0920 05/31/20 0619 06/01/20 0558 06/02/20 0940 06/03/20 0656  NA 141 142 140 140 140  K 3.5 3.3* 3.3* 3.5  3.3*  CL 97* 96* 95* 95* 95*  CO2 33* 33* 34* 34* 38*  GLUCOSE 105* 123* 129* 169* 137*  BUN 46* 48* 50* 52* 50*  CREATININE 2.22* 2.17* 2.19* 2.03* 2.45*  CALCIUM 8.3* 8.3* 8.4* 8.4* 8.5*  MG 1.9 1.9 2.0 1.9 1.9  PHOS 4.2 4.3 4.1 4.1 3.7   GFR: Estimated Creatinine Clearance: 26.4 mL/min (A) (by C-G formula based on SCr of 2.45 mg/dL (H)). Liver Function Tests: Recent Labs  Lab 05/30/20 0920 05/31/20 0619 06/01/20 0558 06/02/20 0940 06/03/20 0656  AST 18 17 18 17 15   ALT 10 9 11 11 9   ALKPHOS 91 86 84 79 83  BILITOT 0.8 0.7 0.7 0.6 0.6  PROT 6.2* 6.0* 6.5 6.5 6.3*  ALBUMIN 2.6* 2.5* 2.6* 2.7* 2.7*   No results for input(s): LIPASE, AMYLASE in the last 168 hours. No results for input(s): AMMONIA in the last 168 hours. Coagulation Profile: No results for input(s): INR, PROTIME in the last 168 hours. Cardiac Enzymes: No results for input(s): CKTOTAL, CKMB, CKMBINDEX, TROPONINI in the last 168 hours. BNP (last 3 results) No results for input(s): PROBNP in the last 8760 hours. HbA1C: No results for input(s): HGBA1C in the last 72 hours. CBG: Recent Labs  Lab 06/03/20 0813 06/03/20 1222 06/03/20 1734 06/03/20 2250 06/04/20 0709  GLUCAP 134* 166* 160* 188* 114*   Lipid Profile: No results for input(s): CHOL, HDL, LDLCALC, TRIG, CHOLHDL, LDLDIRECT in the last 72 hours. Thyroid Function Tests: No results for input(s): TSH, T4TOTAL, FREET4, T3FREE, THYROIDAB in the last 72 hours. Anemia Panel: No results for input(s): VITAMINB12, FOLATE, FERRITIN, TIBC, IRON, RETICCTPCT in the last 72 hours. Sepsis Labs: No results for input(s): PROCALCITON, LATICACIDVEN in the last 168 hours.  No results found for this or any previous visit (from the past 240 hour(s)).   RN Pressure Injury Documentation: Pressure Injury 05/29/20 Thigh Posterior;Proximal;Right Stage 2 -  Partial thickness loss of dermis presenting as a shallow open injury with a red, pink wound bed without slough. 3  small open areas (Active)  05/29/20 2000  Location: Thigh  Location Orientation: Posterior;Proximal;Right  Staging: Stage 2 -  Partial thickness loss of dermis presenting as a shallow open injury with a red, pink wound bed without slough.  Wound Description (Comments): 3 small open areas  Present on Admission: Yes   Estimated body mass index is 52.51 kg/m as calculated from the following:   Height as of this encounter: 4\' 11"  (1.499 m).   Weight as of this encounter: 117.9 kg.  Malnutrition Type:   Malnutrition Characteristics:   Nutrition Interventions:    Radiology Studies: No results found.  Scheduled Meds:  sodium chloride   Intravenous Once   amLODipine  10 mg Oral Daily   carvedilol  6.25 mg Oral BID WC   cloNIDine  0.2 mg Oral BID WC   diphenhydrAMINE  25 mg Oral TID   ferrous sulfate  325 mg Oral BID WC   furosemide  60 mg Oral BID   hydrALAZINE  100 mg Oral Q8H   hydrocerin  1 application Topical Daily   hydrocortisone  25 mg Rectal Daily   metolazone  5 mg Oral Daily   nutrition supplement (JUVEN)  1 packet Oral BID BM   polyethylene glycol  17 g Oral Daily   sodium bicarbonate  1,300 mg Oral BID WC   Continuous Infusions:   LOS: 10 days   Little Ishikawa, DO [facility] PAGER is on AMION  If 7PM-7AM, please contact night-coverage [email or web address]

## 2020-06-05 DIAGNOSIS — C541 Malignant neoplasm of endometrium: Secondary | ICD-10-CM | POA: Diagnosis not present

## 2020-06-05 DIAGNOSIS — N939 Abnormal uterine and vaginal bleeding, unspecified: Secondary | ICD-10-CM | POA: Diagnosis not present

## 2020-06-05 LAB — GLUCOSE, CAPILLARY
Glucose-Capillary: 126 mg/dL — ABNORMAL HIGH (ref 70–99)
Glucose-Capillary: 176 mg/dL — ABNORMAL HIGH (ref 70–99)
Glucose-Capillary: 145 mg/dL — ABNORMAL HIGH (ref 70–99)
Glucose-Capillary: 144 mg/dL — ABNORMAL HIGH (ref 70–99)

## 2020-06-05 NOTE — Progress Notes (Signed)
Physical Therapy Treatment Patient Details Name: Vanessa [name] MRN: [id number] DOB: [birth year] Today's Date: 06/05/2020    History of Present Illness Vanessa [name] is an 64 y.o. female with PMH significant for recent diagnosis of endometrial CA, HTN, CKD 3, diet-controlled diabetes and recent admission for lower extremity cellulitis who was sent over from her rehab facility for significant increase in her vaginal bleeding    PT Comments    Pt is progressing well with mobility, she ambulated 40' x 2 with RW with seated rest break.She is tolerating increased activity.   Follow Up Recommendations  Home health PT;Supervision for mobility/OOB (SNF recommended; HHPT if insurance declines SNF)     Equipment Recommendations  None recommended by PT    Recommendations for Other Services       Precautions / Restrictions Precautions Precautions: Fall Precaution Comments: vaginal bleeding Restrictions Weight Bearing Restrictions: No    Mobility  Bed Mobility               General bed mobility comments: up in chair  Transfers Overall transfer level: Needs assistance Equipment used: Rolling walker (2 wheeled) Transfers: Sit to/from Stand Sit to Stand: Min guard         General transfer comment: VCs hand placement, no physical assist to rise  Ambulation/Gait Ambulation/Gait assistance: Min guard Gait Distance (Feet): 80 Feet Assistive device: Rolling walker (2 wheeled) Gait Pattern/deviations: Step-through pattern;Decreased stride length Gait velocity: decr   General Gait Details: 40' x 2 with seated rest, followed with recliner, no  loss of balance   Stairs             Wheelchair Mobility    Modified Rankin (Stroke Patients Only)       Balance Overall balance assessment: Needs assistance Sitting-balance support: No upper extremity supported;Feet supported Sitting balance-Leahy Scale: Good     Standing balance support: During functional  activity;Bilateral upper extremity supported Standing balance-Leahy Scale: Fair                              Cognition Arousal/Alertness: Awake/alert Behavior During Therapy: WFL for tasks assessed/performed Overall Cognitive Status: Within Functional Limits for tasks assessed                                        Exercises      General Comments        Pertinent Vitals/Pain Pain Assessment: No/denies pain    Home Living                      Prior Function            PT Goals (current goals can now be found in the care plan section) Acute Rehab PT Goals Patient Stated Goal: to do exercises and walk PT Goal Formulation: With patient Time For Goal Achievement: 06/10/20 Potential to Achieve Goals: Fair Progress towards PT goals: Progressing toward goals    Frequency    Min 3X/week      PT Plan Current plan remains appropriate    Co-evaluation              AM-PAC PT "6 Clicks" Mobility   Outcome Measure  Help needed turning from your back to your side while in a flat bed without using bedrails?: A Little Help needed moving from lying on your  back to sitting on the side of a flat bed without using bedrails?: A Little Help needed moving to and from a bed to a chair (including a wheelchair)?: A Little Help needed standing up from a chair using your arms (e.g., wheelchair or bedside chair)?: A Little Help needed to walk in hospital room?: A Little Help needed climbing 3-5 steps with a railing? : A Lot 6 Click Score: 17    End of Session Equipment Utilized During Treatment: Gait belt Activity Tolerance: Patient tolerated treatment well Patient left: in chair;with call bell/phone within reach;with chair alarm set Nurse Communication: Mobility status PT Visit Diagnosis: Difficulty in walking, not elsewhere classified (R26.2);Unsteadiness on feet (R26.81)     Time: 6546-5035 PT Time Calculation (min) (ACUTE ONLY): 20  min  Charges:  $Gait Training: 8-22 mins                    Blondell Reveal Kistler PT 06/05/2020  Acute Rehabilitation Services Pager [phone] Office [phone]

## 2020-06-05 NOTE — Progress Notes (Signed)
Occupational Therapy Progress Note  Patient progressing well with therapy, demonstrating increased activity tolerance ambulating ~80 ft with 1 standing rest break at supervision level with rolling walker. Educated patient on DME set up at home for energy conservation and AE such as leg lifter with gait belt as patient still having some difficulty with getting legs in/out of bed. Will continue to follow to maximize patient independence in order to facilitate D/C to venue listed below.    06/05/20 1400  OT Visit Information  Last OT Received On 06/05/20  Assistance Needed +1  History of Present Illness Vanessa [name] is an 64 y.o. female with PMH significant for recent diagnosis of endometrial CA, HTN, CKD 3, diet-controlled diabetes and recent admission for lower extremity cellulitis who was sent over from her rehab facility for significant increase in her vaginal bleeding  Precautions  Precautions Fall  Precaution Comments vaginal bleeding  Pain Assessment  Pain Assessment No/denies pain  Cognition  Arousal/Alertness Awake/alert  Behavior During Therapy [facility] for tasks assessed/performed  Overall Cognitive Status Within Functional Limits for tasks assessed  ADL  Overall ADL's  Needs assistance/impaired  Toilet Transfer Supervision/safety;RW  Toilet Transfer Details (indicate cue type and reason) patient supervision level for functional mobility and sit<> stand from recliner  Functional mobility during ADLs Supervision/safety;Rolling walker  General ADL Comments brought blue leg lifter into room, will address with patient next session. Educate patient on set up of BSC over toilet at home as she relies on UEs for sit<>stand and does not have grab bars in bathroom at home. Patient verbalize understanding. Also educate patient BSC can be used as shower chair for her walk in shower for energy conservation. Patient participate in functional ambulation with rolling walker to maximize activity  tolerance necessary for daily routine. Patient tolerate ambulation ~44ft with 1 standing rest break at supervision level.  Bed Mobility  Overal bed mobility Needs Assistance  Bed Mobility Sit to Supine  Sit to supine Min assist  General bed mobility comments patient trial leg lifter made from gait belt, patient able to lift LEs up towards bed with min A to place onto EOB. pt able to utilize leg lifter to lower legs off EOB with set up A   Balance  Overall balance assessment Needs assistance  Sitting-balance support Feet supported  Sitting balance-Leahy Scale Good  Standing balance support Bilateral upper extremity supported  Standing balance-Leahy Scale Fair  Standing balance comment static stand without UE support  Transfers  Overall transfer level Needs assistance  Equipment used Rolling walker (2 wheeled)  Transfers Sit to/from Stand  Sit to Stand Supervision  General transfer comment min cues initially for hand placement, demonstrates good carry over  Exercises  Exercises Other exercises  Other Exercises  Other Exercises sit to stand from recliner x5 rep, B UE assist to power up  OT - End of Session  Equipment Utilized During Treatment Rolling walker  Activity Tolerance Patient tolerated treatment well  Patient left in chair;with call bell/phone within reach;with chair alarm set  Nurse Communication Mobility status  OT Assessment/Plan  OT Plan Discharge plan needs to be updated  OT Visit Diagnosis Other abnormalities of gait and mobility (R26.89);Muscle weakness (generalized) (M62.81)  OT Frequency (ACUTE ONLY) Min 2X/week  Follow Up Recommendations Home health OT  OT Equipment None recommended by OT  AM-PAC OT "6 Clicks" Daily Activity Outcome Measure (Version 2)  Help from another person eating meals? 3  Help from another person taking care of personal  grooming? 3  Help from another person toileting, which includes using toliet, bedpan, or urinal? 3  Help from another  person bathing (including washing, rinsing, drying)? 2  Help from another person to put on and taking off regular upper body clothing? 3  Help from another person to put on and taking off regular lower body clothing? 3  6 Click Score 17  OT Goal Progression  Progress towards OT goals Progressing toward goals  Acute Rehab OT Goals  Patient Stated Goal build more strength  OT Goal Formulation With patient  Time For Goal Achievement 06/14/20  Potential to Achieve Goals Good  ADL Goals  Pt Will Perform Lower Body Bathing with supervision;sit to/from stand;with adaptive equipment  Pt Will Perform Lower Body Dressing with supervision;sit to/from stand;with adaptive equipment  Pt Will Transfer to Toilet with min guard assist;ambulating;bedside commode  Pt Will Perform Toileting - Clothing Manipulation and hygiene with min guard assist;sit to/from stand  Pt/caregiver will Perform Home Exercise Program Increased strength;Right Upper extremity;Left upper extremity;With written HEP provided;With theraband;With theraputty;Independently  Additional ADL Goal #1 Patient will stand at sink to perform grooming task as evidence of improving activity tolerance  OT Time Calculation  OT Start Time (ACUTE ONLY) 1318  OT Stop Time (ACUTE ONLY) 1345  OT Time Calculation (min) 27 min  OT General Charges  $OT Visit 1 Visit  OT Treatments  $Self Care/Home Management  23-37 mins   Delbert Phenix OT OT pager: [phone]

## 2020-06-05 NOTE — Progress Notes (Signed)
PROGRESS NOTE    Vanessa [name]  INO:[id number] DOB: 07/23/1955 DOA: 05/24/2020 PCP: Pcp, No   Brief Narrative:  HPI per Dr. Bonnell Public on 05/24/20 Vanessa [name] is an 64 y.o. female with PMH significant for recent diagnosis of endometrial CA, HTN, CKD 3, diet-controlled diabetes and recent admission for lower extremity cellulitis who was sent over from her rehab facility for significant increase in her vaginal bleeding.  Patient apparently has chronic vaginal bleeding however over the past couple of days apparently she has been discharging a lot of clots and much more bleeding than usual.  Patient herself is unaware of this as she spends most of her day in bed and wears a diaper.  She was alarmed however when she saw how much blood there was in her diaper earlier today.  She agreed with staff at the rehab facility that she needed to come to the ED. Patient was discussed with GYN in the ED, they recommended admission to hospitalist with comanagement by them.  They will manage her vaginal bleeding.  Interim History  She was Admitted with symptomatic anemia, vaginal bleeding due to endometrial cancer.  Gynecology oncology consulted as well as radiation oncology.  Patient was transfused 4 units total as her hemoglobin dropped again to 6.8 the day before yesterday (2 units given).  She was transferred to [facility] for radiation therapy.  She has undergone 5 radiation treatment and has been transfused a total of 5 units of PRBCs. Unfortunately her insurance has denied authorization for SNF and when I called for peer-to-peer it would not let me connect to anybody live and I did not get to discuss the case with anyone today.  05/30/2020 Renal function initially improving, continues to have bleeding, Foley removed  05/31/2020 Continues radiation, vaginal bleeding markedly improving  06/01/20  Unsafe disposition, family unable to take patient home current state, SNF declined admission.   Continues to have transportation issues for radiation, disposition planning ongoing -last radiation treatment last radiation treatment on November 30  11/23 -to present Patient continues to advance with physical therapy, hoping she will improve by 30th on her last day of radiation enough to return home safely.   Assessment & Plan:   Principal Problem:   Abnormal uterine bleeding due to primary malignant neoplasm of endometrium (HCC) Active Problems:   DM type 2 (diabetes mellitus, type 2) (HCC)   Dyslipidemia   Symptomatic anemia   Benign hypertension with CKD (chronic kidney disease) stage III (HCC)   Endometrial cancer (HCC)   Iron deficiency anemia due to chronic blood loss   Edema  Symptomatic anemia/ Acute on chronic normocytic anemia/ Vaginal bleeding secondary to endometrial cancer, improved -Vaginal bleeding is slowly resolving/improving -S/p total 5 units of PRBC's since admission -Continue to Hold Aspirin -Continue iron supplementation with Ferrous Sulfate 325 mg po BID -Gyn Onc and radiation oncology consultations following along - appreciate insight and recommendations -Radiation oncology recommended 10 radiation treatments and transfer to [facility]; last day of therapy 11/30th -Unfortunately she has been denied for SNF and due to transport issues cannot get to radiation from home. -We will reach out to Gyn-Oncology to make sure they don't have any other recommendations prior to [place]; she was advised to have treatment recommendations for radiation in August 2021 but failed to show 5 different appointments that were made with Dr. Dwyane Dee due to transportation issues. Dr. Delsa Sale recommending no additional GYN recommendations -Per Dr. Denman George she is not a candidate for hysterectomy  and has expelled her IUD.  She do not feel that she will have much response to the replaced IUD or oral progestins  Hemorrhoids -We will continue HC suppositories -There is some  concern that hemorrhoids may be contributing to bleeding -We will need to follow this closely, will ask nurses to assess when they change her.  Abnormal EKG Regular narrow complex bradycardia read as sinus by EKG machine  P wave and T waves are diffusely flattened so is difficult for me to confirm that QTC is elevated at 507, will hold Levaquin This is without any change from previous EKG from last month.  Chronic Kidney Disease, Stage IV, stable and improving  -BUN/Creatinine appears to be at baseline and actually improved -BUN/Cr went frm 37/2.72 ->52/2.03 now -Patient had a Foley catheter but this got dislodged and will hold off replacing for now -Avoid further Nephrotoxic Medications, Contrast Dyes, Hypotension and Renally adjust Medications -Repeat CMP in the AM   Essential HTN -Patient is actually hypertensive despite ongoing vaginal bleeding -We will Continue Amlodipine 10, Conidine 0.2 mg twice daily, Carvedilol 6.25 mg po BID and Hydralazine 100 mg q8h per home doses -We will also continue Furosemide 60 mg po BID and Metolazone 5 given patient is to receive 2 units of blood and she has significant lower extremity edema -BP was elevated at 139/64 this AM -May need IV Hydralazine  -Continue to Monitor BP per protocol   Diet Controlled DM2 -Patient does not appear to be on any antidiabetic medicine -Last HbA1c was 5.9 on 04/22/20 -Will place patient on carb controlled diet and check fingersticks  -SSI not ordered -Continue to monitor blood sugars carefully; CBG's ranging from 104-144 -Blood sugars ranging from 105-141 on daily BMPs  Stage II Sacral Ulcer -Present on Admission -Wound care consulted  Deconditioning, profound ambulatory dysfunction -PT consulted for further evaluation and recommending SNF but unfortunately she was denied Authorization and When I called for the Peer-to-Peer I was not connected to anyone live and was on hold for 20 minutes -PT/OT to continue  to work with the patient and still recommending SNF level of care but if not 24 hour Supervision/Assistance -Will need re-evaluation prior to D/C and it is still recommending SNF level of care however she will need to go home with home health maximal therapy once she is able to do more; She was an unsafe discharge disposition yesterday and still feel that it is unsafe even though we have reached the husband, she will need 24-hour supervision and will need to ensure she can come back for Radiation Treatments to be completed and do not know if husband can provide the 24/7 Level of Supervision she will need   Hypokalemia, resolved -Continue to Monitor and and Replete as Necessary -Repeat CMP in the AM   Levaquin use -Patient was started on a 7-day course of Levaquin 05/22/2020, unclear indication -Given her prolonged QTC, medication held -Currently no evidence of pneumonia or urinary tract infection  Leukopenia -Mild with a WBC of 3.8 -Continue to Monitor and Trend -Repeat CMP In the AM   Super Morbid Obesity -Complicates overall prognosis and care -Estimated body mass index is 52.51 kg/m as calculated from the following:   Height as of this encounter: 4\' 11"  (1.499 m).   Weight as of this encounter: 117.9 kg. -Weight Loss and Dietary Counseling given   DVT prophylaxis: SCDs given Vaginal Bleeding  Code Status: FULL CODE  Family Communication: No family present at bedside  Disposition Plan:  Likely discharge home on the 30th after completion of radiation ending improvement in ambulation and safe disposition  Status is: Inpatient  Remains inpatient appropriate because:Ongoing diagnostic testing needed not appropriate for outpatient work up, Unsafe d/c plan, IV treatments appropriate due to intensity of illness or inability to take PO and Inpatient level of care appropriate due to severity of illness   Dispo: The patient is from: Home              Anticipated d/c is to: SNF denied -  dispo home November 30 after completing radiation              Anticipated d/c date is: 06/09/2020              Patient currently is medically stable to d/c.  Currently remains in the hospital for radiation treatment due to transportation issues and unable to discharge to skilled nursing facility.  Consultants:   Gyn-Oncology   Radiation-Oncology    Procedures: Radiation   Antimicrobials:  Anti-infectives (From admission, onward)   Start     Dose/Rate Route Frequency Ordered Stop   05/25/20 1800  levofloxacin (LEVAQUIN) tablet 750 mg  Status:  Discontinued        750 mg Oral Every 48 hours 05/24/20 1549 05/24/20 1610       Subjective: No acute issues or events overnight, denies any ongoing vaginal bleeding, nausea, vomiting, diarrhea, constipation, headache, fevers, chills.  Objective: Vitals:   06/04/20 1418 06/04/20 2211 06/05/20 0550 06/05/20 0738  BP: 130/64 (!) 152/71 (!) 152/73 (!) 159/73  Pulse: 66 65 68   Resp: 13 16 16    Temp: (!) 97.2 F (36.2 C) 97.7 F (36.5 C) 98.2 F (36.8 C)   TempSrc: Oral Oral Oral   SpO2: 92% 100% 96%   Weight:      Height:        Intake/Output Summary (Last 24 hours) at 06/05/2020 0805 Last data filed at 06/05/2020 0610 Gross per 24 hour  Intake 240 ml  Output 700 ml  Net -460 ml   Filed Weights   05/24/20 1245  Weight: 117.9 kg   Examination: Physical Exam:  General:  Pleasantly resting in bed, No acute distress. HEENT:  Normocephalic atraumatic.  Sclerae nonicteric, noninjected.  Extraocular movements intact bilaterally. Neck:  Without mass or deformity.  Trachea is midline. Lungs:  Clear to auscultate bilaterally without rhonchi, wheeze, or rales. Heart:  Regular rate and rhythm.  Without murmurs, rubs, or gallops. Abdomen:  Soft, obese, nontender, nondistended.  Without guarding or rebound. Extremities: Without cyanosis, clubbing, edema, or obvious deformity. Vascular:  Dorsalis pedis and posterior tibial pulses  palpable bilaterally. Skin:  Warm and dry, no erythema, no ulcerations.   Data Reviewed: I have personally reviewed following labs and imaging studies  CBC: Recent Labs  Lab 05/30/20 0920 05/31/20 0619 06/01/20 0558 06/02/20 0940 06/03/20 0656  WBC 4.9 3.9* 3.3* 3.8* 3.4*  NEUTROABS 3.7 2.7 2.5 2.9 2.6  HGB 7.8* 8.0* 8.2* 8.4* 7.9*  HCT 24.1* 25.4* 26.1* 27.2* 25.8*  MCV 92.0 96.2 96.3 96.8 96.6  PLT 194 167 179 186 010   Basic Metabolic Panel: Recent Labs  Lab 05/30/20 0920 05/31/20 0619 06/01/20 0558 06/02/20 0940 06/03/20 0656  NA 141 142 140 140 140  K 3.5 3.3* 3.3* 3.5 3.3*  CL 97* 96* 95* 95* 95*  CO2 33* 33* 34* 34* 38*  GLUCOSE 105* 123* 129* 169* 137*  BUN 46* 48* 50* 52* 50*  CREATININE 2.22*  2.17* 2.19* 2.03* 2.45*  CALCIUM 8.3* 8.3* 8.4* 8.4* 8.5*  MG 1.9 1.9 2.0 1.9 1.9  PHOS 4.2 4.3 4.1 4.1 3.7   GFR: Estimated Creatinine Clearance: 26.4 mL/min (A) (by C-G formula based on SCr of 2.45 mg/dL (H)). Liver Function Tests: Recent Labs  Lab 05/30/20 0920 05/31/20 0619 06/01/20 0558 06/02/20 0940 06/03/20 0656  AST 18 17 18 17 15   ALT 10 9 11 11 9   ALKPHOS 91 86 84 79 83  BILITOT 0.8 0.7 0.7 0.6 0.6  PROT 6.2* 6.0* 6.5 6.5 6.3*  ALBUMIN 2.6* 2.5* 2.6* 2.7* 2.7*   No results for input(s): LIPASE, AMYLASE in the last 168 hours. No results for input(s): AMMONIA in the last 168 hours. Coagulation Profile: No results for input(s): INR, PROTIME in the last 168 hours. Cardiac Enzymes: No results for input(s): CKTOTAL, CKMB, CKMBINDEX, TROPONINI in the last 168 hours. BNP (last 3 results) No results for input(s): PROBNP in the last 8760 hours. HbA1C: No results for input(s): HGBA1C in the last 72 hours. CBG: Recent Labs  Lab 06/03/20 2250 06/04/20 0709 06/04/20 1139 06/04/20 1628 06/05/20 0754  GLUCAP 188* 114* 153* 176* 126*   Lipid Profile: No results for input(s): CHOL, HDL, LDLCALC, TRIG, CHOLHDL, LDLDIRECT in the last 72  hours. Thyroid Function Tests: No results for input(s): TSH, T4TOTAL, FREET4, T3FREE, THYROIDAB in the last 72 hours. Anemia Panel: No results for input(s): VITAMINB12, FOLATE, FERRITIN, TIBC, IRON, RETICCTPCT in the last 72 hours. Sepsis Labs: No results for input(s): PROCALCITON, LATICACIDVEN in the last 168 hours.  No results found for this or any previous visit (from the past 240 hour(s)).   RN Pressure Injury Documentation: Pressure Injury 05/29/20 Thigh Posterior;Proximal;Right Stage 2 -  Partial thickness loss of dermis presenting as a shallow open injury with a red, pink wound bed without slough. 3 small open areas (Active)  05/29/20 2000  Location: Thigh  Location Orientation: Posterior;Proximal;Right  Staging: Stage 2 -  Partial thickness loss of dermis presenting as a shallow open injury with a red, pink wound bed without slough.  Wound Description (Comments): 3 small open areas  Present on Admission: Yes   Estimated body mass index is 52.51 kg/m as calculated from the following:   Height as of this encounter: 4\' 11"  (1.499 m).   Weight as of this encounter: 117.9 kg.  Malnutrition Type:   Malnutrition Characteristics:   Nutrition Interventions:    Radiology Studies: No results found.  Scheduled Meds: . sodium chloride   Intravenous Once  . amLODipine  10 mg Oral Daily  . carvedilol  6.25 mg Oral BID WC  . cloNIDine  0.2 mg Oral BID WC  . diphenhydrAMINE  25 mg Oral TID  . ferrous sulfate  325 mg Oral BID WC  . furosemide  60 mg Oral BID  . hydrALAZINE  100 mg Oral Q8H  . hydrocerin  1 application Topical Daily  . hydrocortisone  25 mg Rectal Daily  . metolazone  5 mg Oral Daily  . nutrition supplement (JUVEN)  1 packet Oral BID BM  . polyethylene glycol  17 g Oral Daily  . sodium bicarbonate  1,300 mg Oral BID WC   Continuous Infusions:   LOS: 11 days   Little Ishikawa, DO [facility] PAGER is on AMION  If 7PM-7AM, please contact  night-coverage [email or web address]

## 2020-06-06 DIAGNOSIS — N939 Abnormal uterine and vaginal bleeding, unspecified: Secondary | ICD-10-CM | POA: Diagnosis not present

## 2020-06-06 DIAGNOSIS — C541 Malignant neoplasm of endometrium: Secondary | ICD-10-CM | POA: Diagnosis not present

## 2020-06-06 LAB — CBC
HCT: 25.6 % — ABNORMAL LOW (ref 36.0–46.0)
Hemoglobin: 8.1 g/dL — ABNORMAL LOW (ref 12.0–15.0)
MCH: 30.3 pg (ref 26.0–34.0)
MCHC: 31.6 g/dL (ref 30.0–36.0)
MCV: 95.9 fL (ref 80.0–100.0)
Platelets: 168 10*3/uL (ref 150–400)
RBC: 2.67 MIL/uL — ABNORMAL LOW (ref 3.87–5.11)
RDW: 15.6 % — ABNORMAL HIGH (ref 11.5–15.5)
WBC: 2.8 10*3/uL — ABNORMAL LOW (ref 4.0–10.5)
nRBC: 0 % (ref 0.0–0.2)

## 2020-06-06 LAB — GLUCOSE, CAPILLARY
Glucose-Capillary: 124 mg/dL — ABNORMAL HIGH (ref 70–99)
Glucose-Capillary: 200 mg/dL — ABNORMAL HIGH (ref 70–99)
Glucose-Capillary: 155 mg/dL — ABNORMAL HIGH (ref 70–99)
Glucose-Capillary: 138 mg/dL — ABNORMAL HIGH (ref 70–99)

## 2020-06-06 NOTE — Progress Notes (Signed)
PROGRESS NOTE    Vanessa [name]  HBZ:[id number] DOB: [birth year] DOA: 05/24/2020 PCP: Pcp, No   Brief Narrative:  HPI per Dr. Bonnell Public on 05/24/20 Vanessa [name] is an 64 y.o. female with PMH significant for recent diagnosis of endometrial CA, HTN, CKD 3, diet-controlled diabetes and recent admission for lower extremity cellulitis who was sent over from her rehab facility for significant increase in her vaginal bleeding.  Patient apparently has chronic vaginal bleeding however over the past couple of days apparently she has been discharging a lot of clots and much more bleeding than usual.  Patient herself is unaware of this as she spends most of her day in bed and wears a diaper.  She was alarmed however when she saw how much blood there was in her diaper earlier today.  She agreed with staff at the rehab facility that she needed to come to the ED. Patient was discussed with GYN in the ED, they recommended admission to hospitalist with comanagement by them.  They will manage her vaginal bleeding.  Interim History  She was Admitted with symptomatic anemia, vaginal bleeding due to endometrial cancer.  Gynecology oncology consulted as well as radiation oncology.  Patient was transfused 4 units total as her hemoglobin dropped again to 6.8 the day before yesterday (2 units given).  She was transferred to [facility] for radiation therapy.  She has undergone 5 radiation treatment and has been transfused a total of 5 units of PRBCs. Unfortunately her insurance has denied authorization for SNF and when I called for peer-to-peer it would not let me connect to anybody live and I did not get to discuss the case with anyone today.  05/30/2020 Renal function initially improving, continues to have bleeding, Foley removed  05/31/2020 Continues radiation, vaginal bleeding markedly improving  06/01/20  Unsafe disposition, family unable to take patient home current state, SNF declined admission.   Continues to have transportation issues for radiation, disposition planning ongoing -last radiation treatment last radiation treatment on November 30  11/23 -to present Patient continues to advance fairly well with physical therapy, hoping she will improve by 30th on her last day of radiation so that she can return home safely.   Assessment & Plan:   Principal Problem:   Abnormal uterine bleeding due to primary malignant neoplasm of endometrium (HCC) Active Problems:   DM type 2 (diabetes mellitus, type 2) (HCC)   Dyslipidemia   Symptomatic anemia   Benign hypertension with CKD (chronic kidney disease) stage III (HCC)   Endometrial cancer (HCC)   Iron deficiency anemia due to chronic blood loss   Edema  Symptomatic anemia/ Acute on chronic normocytic anemia/ Vaginal bleeding secondary to endometrial cancer, improved -Vaginal bleeding has resolved at this point -S/p total 5 units of PRBC's since admission -Continue to Hold Aspirin, Continue iron supplementation with Ferrous Sulfate 325 mg po BID -Gyn Onc and radiation oncology consultations following along - appreciate insight and recommendations -Radiation oncology recommended 10 radiation treatments and transfer to [facility]; last day of therapy 11/30th -Unfortunately she has been denied for SNF and due to transport issues cannot get to radiation from home. -We will reach out to Gyn-Oncology to make sure they don't have any other recommendations prior to [place]; she was advised to have treatment recommendations for radiation in August 2021 but failed to show 5 different appointments that were made with Dr. Dwyane Dee due to transportation issues. Dr. Delsa Sale recommending no additional GYN recommendations -Per Dr. Denman George she  is not a candidate for hysterectomy and has expelled her IUD.  She do not feel that she will have much response to the replaced IUD or oral progestins  Hemorrhoids -We will continue HC  suppositories -There is some concern that hemorrhoids may be contributing to bleeding -We will need to follow this closely, will ask nurses to assess when they change her.  Abnormal EKG Regular narrow complex bradycardia read as sinus by EKG machine  P wave and T waves are diffusely flattened so is difficult for me to confirm that QTC is elevated at 507, will discontinue Levaquin (unclear indication for starting in outpatient setting) This is without any change from previous EKG from last month.  Chronic Kidney Disease, Stage IV, stable and improving  -BUN/Creatinine appears to be somewhat better than baseline -Patient had a Foley catheter but this got dislodged and will hold off replacing for now -Avoid further Nephrotoxic Medications, Contrast Dyes, Hypotension and Renally adjust Medications -Follow labs only as needed at this point  Essential HTN -Moderately well controlled at this point -We will Continue Amlodipine 10, Conidine 0.2 mg twice daily, Carvedilol 6.25 mg po BID and Hydralazine 100 mg q8h, Furosemide 60 mg po BID and Metolazone 5  Diet Controlled DM2 -Patient does not appear to be on any antidiabetic medicine -Last HbA1c was 5.9 on 04/22/20 -Will place patient on carb controlled diet and check fingersticks  -SSI not ordered -Continue to monitor blood sugars carefully; CBG's ranging from 104-144 -Blood sugars ranging from 105-141 on daily BMPs  Stage II Sacral Ulcer -Present on Admission -Wound care consulted  Deconditioning, profound ambulatory dysfunction -PT consulted for further evaluation and recommending SNF but unfortunately she was denied Authorization and When I called for the Peer-to-Peer I was not connected to anyone live and was on hold for 20 minutes -PT/OT to continue to work with the patient and she continues to improve daily, ambulating further with less assistance per their documentation  Hypokalemia, resolved -Continue to Monitor and and Replete  as Necessary -Repeat CMP in the AM   Leukopenia, stable -Mild with a WBC of 2.8 -Continue to Monitor and Trend  Super Morbid Obesity -Complicates overall prognosis and care -Estimated body mass index is 52.51 kg/m as calculated from the following:   Height as of this encounter: 4\' 11"  (1.499 m).   Weight as of this encounter: 117.9 kg. -Weight Loss and Dietary Counseling given   DVT prophylaxis: SCDs given Vaginal Bleeding  Code Status: FULL CODE  Family Communication: No family present at bedside  Disposition Plan: Likely discharge home on the 30th after completion of radiation ending improvement in ambulation and safe disposition  Status is: Inpatient  Remains inpatient appropriate because:Ongoing diagnostic testing needed not appropriate for outpatient work up, Unsafe d/c plan, IV treatments appropriate due to intensity of illness or inability to take PO and Inpatient level of care appropriate due to severity of illness   Dispo: The patient is from: Home              Anticipated d/c is to: SNF denied - dispo home November 30 after completing radiation              Anticipated d/c date is: 06/09/2020              Patient currently is medically stable to d/c.  Currently remains in the hospital for radiation treatment due to transportation issues and unable to discharge to skilled nursing facility.  Consultants:   Gyn-Oncology  Radiation-Oncology    Procedures: Radiation   Antimicrobials:  Anti-infectives (From admission, onward)   Start     Dose/Rate Route Frequency Ordered Stop   05/25/20 1800  levofloxacin (LEVAQUIN) tablet 750 mg  Status:  Discontinued        750 mg Oral Every 48 hours 05/24/20 1549 05/24/20 1610       Subjective: No acute issues or events overnight, denies any ongoing vaginal bleeding, nausea, vomiting, diarrhea, constipation, headache, fevers, chills.  Objective: Vitals:   06/05/20 2033 06/05/20 2116 06/06/20 0428 06/06/20 0726  BP: 137/68  (!) 139/56 (!) 172/79 (!) 151/66  Pulse: 69 68 73 66  Resp: 14  20   Temp: (!) 97.5 F (36.4 C)  (!) 97.5 F (36.4 C) 97.6 F (36.4 C)  TempSrc: Oral  Oral Oral  SpO2: 97%  95% 93%  Weight:      Height:        Intake/Output Summary (Last 24 hours) at 06/06/2020 0810 Last data filed at 06/05/2020 1750 Gross per 24 hour  Intake 480 ml  Output --  Net 480 ml   Filed Weights   05/24/20 1245  Weight: 117.9 kg   Examination: Physical Exam:  General:  Pleasantly resting in bed, No acute distress. HEENT:  Normocephalic atraumatic.  Sclerae nonicteric, noninjected.  Extraocular movements intact bilaterally. Neck:  Without mass or deformity.  Trachea is midline. Lungs:  Clear to auscultate bilaterally without rhonchi, wheeze, or rales. Heart:  Regular rate and rhythm.  Without murmurs, rubs, or gallops. Abdomen:  Soft, obese, nontender, nondistended.  Without guarding or rebound. Extremities: Without cyanosis, clubbing, edema, or obvious deformity. Vascular:  Dorsalis pedis and posterior tibial pulses palpable bilaterally. Skin:  Warm and dry, no erythema, no ulcerations.   Data Reviewed: I have personally reviewed following labs and imaging studies  CBC: Recent Labs  Lab 05/30/20 0920 05/30/20 0920 05/31/20 0619 06/01/20 0558 06/02/20 0940 06/03/20 0656 06/06/20 0544  WBC 4.9   < > 3.9* 3.3* 3.8* 3.4* 2.8*  NEUTROABS 3.7  --  2.7 2.5 2.9 2.6  --   HGB 7.8*   < > 8.0* 8.2* 8.4* 7.9* 8.1*  HCT 24.1*   < > 25.4* 26.1* 27.2* 25.8* 25.6*  MCV 92.0   < > 96.2 96.3 96.8 96.6 95.9  PLT 194   < > 167 179 186 178 168   < > = values in this interval not displayed.   Basic Metabolic Panel: Recent Labs  Lab 05/30/20 0920 05/31/20 0619 06/01/20 0558 06/02/20 0940 06/03/20 0656  NA 141 142 140 140 140  K 3.5 3.3* 3.3* 3.5 3.3*  CL 97* 96* 95* 95* 95*  CO2 33* 33* 34* 34* 38*  GLUCOSE 105* 123* 129* 169* 137*  BUN 46* 48* 50* 52* 50*  CREATININE 2.22* 2.17* 2.19* 2.03*  2.45*  CALCIUM 8.3* 8.3* 8.4* 8.4* 8.5*  MG 1.9 1.9 2.0 1.9 1.9  PHOS 4.2 4.3 4.1 4.1 3.7   GFR: Estimated Creatinine Clearance: 26.4 mL/min (A) (by C-G formula based on SCr of 2.45 mg/dL (H)). Liver Function Tests: Recent Labs  Lab 05/30/20 0920 05/31/20 0619 06/01/20 0558 06/02/20 0940 06/03/20 0656  AST 18 17 18 17 15   ALT 10 9 11 11 9   ALKPHOS 91 86 84 79 83  BILITOT 0.8 0.7 0.7 0.6 0.6  PROT 6.2* 6.0* 6.5 6.5 6.3*  ALBUMIN 2.6* 2.5* 2.6* 2.7* 2.7*   No results for input(s): LIPASE, AMYLASE in the last 168 hours. No  results for input(s): AMMONIA in the last 168 hours. Coagulation Profile: No results for input(s): INR, PROTIME in the last 168 hours. Cardiac Enzymes: No results for input(s): CKTOTAL, CKMB, CKMBINDEX, TROPONINI in the last 168 hours. BNP (last 3 results) No results for input(s): PROBNP in the last 8760 hours. HbA1C: No results for input(s): HGBA1C in the last 72 hours. CBG: Recent Labs  Lab 06/05/20 0754 06/05/20 1140 06/05/20 1601 06/05/20 2228 06/06/20 0724  GLUCAP 126* 176* 145* 144* 124*   Lipid Profile: No results for input(s): CHOL, HDL, LDLCALC, TRIG, CHOLHDL, LDLDIRECT in the last 72 hours. Thyroid Function Tests: No results for input(s): TSH, T4TOTAL, FREET4, T3FREE, THYROIDAB in the last 72 hours. Anemia Panel: No results for input(s): VITAMINB12, FOLATE, FERRITIN, TIBC, IRON, RETICCTPCT in the last 72 hours. Sepsis Labs: No results for input(s): PROCALCITON, LATICACIDVEN in the last 168 hours.  No results found for this or any previous visit (from the past 240 hour(s)).   RN Pressure Injury Documentation: Pressure Injury 05/29/20 Thigh Posterior;Proximal;Right Stage 2 -  Partial thickness loss of dermis presenting as a shallow open injury with a red, pink wound bed without slough. 3 small open areas (Active)  05/29/20 2000  Location: Thigh  Location Orientation: Posterior;Proximal;Right  Staging: Stage 2 -  Partial thickness loss  of dermis presenting as a shallow open injury with a red, pink wound bed without slough.  Wound Description (Comments): 3 small open areas  Present on Admission: Yes   Estimated body mass index is 52.51 kg/m as calculated from the following:   Height as of this encounter: 4\' 11"  (1.499 m).   Weight as of this encounter: 117.9 kg.  Malnutrition Type:   Malnutrition Characteristics:   Nutrition Interventions:    Radiology Studies: No results found.  Scheduled Meds: . sodium chloride   Intravenous Once  . amLODipine  10 mg Oral Daily  . carvedilol  6.25 mg Oral BID WC  . cloNIDine  0.2 mg Oral BID WC  . diphenhydrAMINE  25 mg Oral TID  . ferrous sulfate  325 mg Oral BID WC  . furosemide  60 mg Oral BID  . hydrALAZINE  100 mg Oral Q8H  . hydrocerin  1 application Topical Daily  . hydrocortisone  25 mg Rectal Daily  . metolazone  5 mg Oral Daily  . nutrition supplement (JUVEN)  1 packet Oral BID BM  . polyethylene glycol  17 g Oral Daily  . sodium bicarbonate  1,300 mg Oral BID WC   Continuous Infusions:   LOS: 12 days   Little Ishikawa, DO [facility] PAGER is on AMION  If 7PM-7AM, please contact night-coverage [email or web address]

## 2020-06-06 NOTE — Progress Notes (Signed)
Occupational Therapy Treatment Patient Details Name: Vanessa [name] MRN: [id number] DOB: 05/23/1956 Today's Date: 06/06/2020    History of present illness Vanessa [name] is an 64 y.o. female with PMH significant for recent diagnosis of endometrial CA, HTN, CKD 3, diet-controlled diabetes and recent admission for lower extremity cellulitis who was sent over from her rehab facility for significant increase in her vaginal bleeding   OT comments  Patient reports more fatigued today, up a lot during the night. Completed g/h set up in sitting, supervision for ambulation and transfer to toilet with RW, supervision for perianal care. Initiated use of leg lifter to maximize patient independence with bed mobility however still requiring min A to lift LEs onto bed. Will continue to follow to maximize patient activity tolerance, strength and independence with ADLs in order to D/C home.   Follow Up Recommendations  Home health OT    Equipment Recommendations  None recommended by OT       Precautions / Restrictions Precautions Precautions: Fall Precaution Comments: vaginal bleeding       Mobility Bed Mobility Overal bed mobility: Needs Assistance Bed Mobility: Sit to Supine       Sit to supine: Min assist      Transfers Overall transfer level: Needs assistance Equipment used: Rolling walker (2 wheeled) Transfers: Sit to/from Stand Sit to Stand: Supervision         General transfer comment: supervision for safety    Balance Overall balance assessment: Needs assistance Sitting-balance support: Feet supported Sitting balance-Leahy Scale: Good     Standing balance support: Bilateral upper extremity supported;No upper extremity supported Standing balance-Leahy Scale: Fair Standing balance comment: perform peri care without UE assist                           ADL either performed or assessed with clinical judgement   ADL Overall ADL's : Needs assistance/impaired      Grooming: Oral care;Set up;Sitting;Wash/dry hands Grooming Details (indicate cue type and reason): reports feeling tired today was up a lot last night going to the bathroom, performed g/h in siting                  Toilet Transfer: Supervision/safety;RW;Regular Toilet;Ambulation Toilet Transfer Details (indicate cue type and reason): no physical assistance to ambulate to/from bathroom and transfer on/off toilet Toileting- Clothing Manipulation and Hygiene: Supervision/safety;Sit to/from stand Toileting - Clothing Manipulation Details (indicate cue type and reason): patient able to perform perianal care after bowel movement in standing, no loss of balance noted      Functional mobility during ADLs: Supervision/safety;Rolling walker General ADL Comments: attempted use of blue leg lifter to mobilize LEs into bed however patient still requiring min A to complete lifting onto EOB               Cognition Arousal/Alertness: Awake/alert Behavior During Therapy: WFL for tasks assessed/performed Overall Cognitive Status: Within Functional Limits for tasks assessed                                                     Pertinent Vitals/ Pain       Pain Assessment: No/denies pain         Frequency  Min 2X/week        Progress Toward Goals  OT   Goals(current goals can now be found in the care plan section)  Progress towards OT goals: Progressing toward goals;Goals met and updated - see care plan  Acute Rehab OT Goals Patient Stated Goal: build more strength OT Goal Formulation: With patient Time For Goal Achievement: 06/14/20 Potential to Achieve Goals: Good ADL Goals Pt Will Perform Lower Body Bathing: sit to/from stand;with adaptive equipment;with modified independence Pt Will Perform Lower Body Dressing: sit to/from stand;with adaptive equipment;with modified independence Pt Will Transfer to Toilet: ambulating;bedside commode;with modified  independence Pt Will Perform Toileting - Clothing Manipulation and hygiene: with modified independence Pt/caregiver will Perform Home Exercise Program: Increased strength;Right Upper extremity;Left upper extremity;With written HEP provided;With theraband;With theraputty;Independently Additional ADL Goal #1: Patient will stand at sink to perform grooming task as evidence of improving activity tolerance  Plan Discharge plan remains appropriate       AM-PAC OT "6 Clicks" Daily Activity     Outcome Measure   Help from another person eating meals?: A Little Help from another person taking care of personal grooming?: A Little Help from another person toileting, which includes using toliet, bedpan, or urinal?: A Little Help from another person bathing (including washing, rinsing, drying)?: A Lot Help from another person to put on and taking off regular upper body clothing?: A Little Help from another person to put on and taking off regular lower body clothing?: A Little 6 Click Score: 17    End of Session Equipment Utilized During Treatment: Rolling walker  OT Visit Diagnosis: Other abnormalities of gait and mobility (R26.89);Muscle weakness (generalized) (M62.81)   Activity Tolerance Patient tolerated treatment well   Patient Left Other (comment);with bed alarm set;with call bell/phone within reach (EOB to eat lunch)   Nurse Communication Mobility status;Other (comment) (sitting EOB for lunch )        Time: 1130-1200 OT Time Calculation (min): 30 min  Charges: OT General Charges $OT Visit: 1 Visit OT Treatments $Self Care/Home Management : 23-37 mins    OT OT pager: [phone]    K  06/06/2020, 2:20 PM    

## 2020-06-07 DIAGNOSIS — C541 Malignant neoplasm of endometrium: Secondary | ICD-10-CM | POA: Diagnosis not present

## 2020-06-07 DIAGNOSIS — N939 Abnormal uterine and vaginal bleeding, unspecified: Secondary | ICD-10-CM | POA: Diagnosis not present

## 2020-06-07 LAB — GLUCOSE, CAPILLARY
Glucose-Capillary: 117 mg/dL — ABNORMAL HIGH (ref 70–99)
Glucose-Capillary: 159 mg/dL — ABNORMAL HIGH (ref 70–99)
Glucose-Capillary: 135 mg/dL — ABNORMAL HIGH (ref 70–99)
Glucose-Capillary: 169 mg/dL — ABNORMAL HIGH (ref 70–99)

## 2020-06-07 MED ORDER — LOPERAMIDE HCL 2 MG PO CAPS
4.0000 mg | ORAL_CAPSULE | ORAL | Status: DC | PRN
  Administered 2020-06-09: 4 mg via ORAL
  Filled 2020-06-07: qty 2

## 2020-06-07 NOTE — Progress Notes (Addendum)
PROGRESS NOTE    Vanessa [name]  UTM:[id number] DOB: 1956-02-02 DOA: 05/24/2020 PCP: Pcp, No   Brief Narrative:  HPI per Dr. Bonnell Public on 05/24/20 Vanessa [name] is an 64 y.o. female with PMH significant for recent diagnosis of endometrial CA, HTN, CKD 3, diet-controlled diabetes and recent admission for lower extremity cellulitis who was sent over from her rehab facility for significant increase in her vaginal bleeding.  Patient apparently has chronic vaginal bleeding however over the past couple of days apparently she has been discharging a lot of clots and much more bleeding than usual.  Patient herself is unaware of this as she spends most of her day in bed and wears a diaper.  She was alarmed however when she saw how much blood there was in her diaper earlier today.  She agreed with staff at the rehab facility that she needed to come to the ED. Patient was discussed with GYN in the ED, they recommended admission to hospitalist with comanagement by them.  They will manage her vaginal bleeding.  Interim History  She was Admitted with symptomatic anemia, vaginal bleeding due to endometrial cancer.  Gynecology oncology consulted as well as radiation oncology.  Patient was transfused 4 units total as her hemoglobin dropped again to 6.8 the day before yesterday (2 units given).  She was transferred to [facility] for radiation therapy.  She has undergone 5 radiation treatment and has been transfused a total of 5 units of PRBCs. Unfortunately her insurance has denied authorization for SNF and when I called for peer-to-peer it would not let me connect to anybody live and I did not get to discuss the case with anyone today.  05/30/2020 Renal function initially improving, continues to have bleeding, Foley removed  05/31/2020 Continues radiation, vaginal bleeding markedly improving  06/01/20  Unsafe disposition, family unable to take patient home current state, SNF declined admission.   Continues to have transportation issues for radiation, disposition planning ongoing -last radiation treatment last radiation treatment on November 30  11/23 -to present Patient continues to advance fairly well with PT/OT - currently recommending HHPT, will return home on her last day of radiation November 30th.  Assessment & Plan:   Principal Problem:   Abnormal uterine bleeding due to primary malignant neoplasm of endometrium (HCC) Active Problems:   DM type 2 (diabetes mellitus, type 2) (HCC)   Dyslipidemia   Symptomatic anemia   Benign hypertension with CKD (chronic kidney disease) stage III (HCC)   Endometrial cancer (HCC)   Iron deficiency anemia due to chronic blood loss   Edema  Symptomatic anemia/ Acute on chronic normocytic anemia/ Vaginal bleeding secondary to endometrial cancer, improved -Vaginal bleeding has resolved at this point -S/p total 5 units of PRBC's since admission -Continue to Hold Aspirin, Continue iron supplementation with Ferrous Sulfate 325 mg po BID -Gyn Onc and radiation oncology consultations following along - appreciate insight and recommendations -Radiation oncology recommended 10 radiation treatments and transfer to [facility]; last day of therapy 11/30th -Unfortunately she has been denied for SNF and due to transport issues cannot get to radiation from home. -We will reach out to Gyn-Oncology to make sure they don't have any other recommendations prior to [place]; she was advised to have treatment recommendations for radiation in August 2021 but failed to show 5 different appointments that were made with Dr. Dwyane Dee due to transportation issues. Dr. Delsa Sale recommending no additional GYN recommendations -Per Dr. Denman George she is not a candidate for hysterectomy  and has expelled her IUD.  She do not feel that she will have much response to the replaced IUD or oral progestins  Hemorrhoids -We will continue HC suppositories -There is some  concern that hemorrhoids may be contributing to bleeding -We will need to follow this closely, will ask nurses to assess when they change her.  Abnormal EKG Regular narrow complex bradycardia read as sinus by EKG machine  P wave and T waves are diffusely flattened so is difficult for me to confirm that QTC is elevated at 507, will discontinue Levaquin (unclear indication for starting in outpatient setting) This is without any change from previous EKG from last month.  Chronic Kidney Disease, Stage IV, stable and improving  -BUN/Creatinine appears to be somewhat better than baseline -Patient had a Foley catheter but this got dislodged and will hold off replacing for now -Avoid further Nephrotoxic Medications, Contrast Dyes, Hypotension and Renally adjust Medications -Follow labs only as needed at this point  Essential HTN -Moderately well controlled at this point -We will Continue Amlodipine 10, Conidine 0.2 mg twice daily, Carvedilol 6.25 mg po BID and Hydralazine 100 mg q8h, Furosemide 60 mg po BID and Metolazone 5  Diet Controlled DM2 -Patient does not appear to be on any antidiabetic medicine -Last HbA1c was 5.9 on 04/22/20 -Will place patient on carb controlled diet and check fingersticks  -SSI not ordered -Continue to monitor blood sugars carefully; CBG's ranging from 104-144 -Blood sugars ranging from 105-141 on daily BMPs  Stage II Sacral Ulcer -Present on Admission -Wound care consulted  Deconditioning, profound ambulatory dysfunction -PT consulted for further evaluation and recommending SNF but unfortunately she was denied Authorization and When I called for the Peer-to-Peer I was not connected to anyone live and was on hold for 20 minutes -PT/OT to continue to work with the patient and she continues to improve daily, ambulating further with less assistance per their documentation  Hypokalemia, resolved -Continue to Monitor and and Replete as Necessary -Repeat CMP in  the AM   Leukopenia, stable -Mild with a WBC of 2.8 -Continue to Monitor and Trend  Super Morbid Obesity -Complicates overall prognosis and care -Estimated body mass index is 52.51 kg/m as calculated from the following:   Height as of this encounter: 4\' 11"  (1.499 m).   Weight as of this encounter: 117.9 kg. -Weight Loss and Dietary Counseling given   DVT prophylaxis: SCDs only given Vaginal Bleeding  Code Status: FULL CODE  Family Communication: No family present at bedside  Disposition Plan: Likely discharge home on the 30th after completion of radiation ending improvement in ambulation and safe disposition  Status is: Inpatient  Remains inpatient appropriate because:Ongoing diagnostic testing needed not appropriate for outpatient work up, Unsafe d/c plan, IV treatments appropriate due to intensity of illness or inability to take PO and Inpatient level of care appropriate due to severity of illness   Dispo: The patient is from: Home              Anticipated d/c is to: SNF denied - dispo home November 30 after completing radiation              Anticipated d/c date is: 06/09/2020              Patient currently is medically stable to d/c.  Currently remains in the hospital for radiation treatment due to transportation issues and unable to discharge to skilled nursing facility.  Consultants:   Gyn-Oncology   Radiation-Oncology  Procedures: Radiation   Antimicrobials:  Anti-infectives (From admission, onward)   Start     Dose/Rate Route Frequency Ordered Stop   05/25/20 1800  levofloxacin (LEVAQUIN) tablet 750 mg  Status:  Discontinued        750 mg Oral Every 48 hours 05/24/20 1549 05/24/20 1610       Subjective: Worsening episodes of watery diarrhea overnight, denies any ongoing vaginal bleeding, nausea, vomiting, constipation, headache, fevers, chills.  Objective: Vitals:   06/06/20 1137 06/06/20 1424 06/06/20 2111 06/07/20 0512  BP: (!) 153/75 (!) 151/68 (!)  147/71 (!) 171/76  Pulse: 69 65 65 70  Resp:  19 18 15   Temp:  97.8 F (36.6 C)  98.1 F (36.7 C)  TempSrc:  Oral  Oral  SpO2:  98% 96% 95%  Weight:      Height:       No intake or output data in the 24 hours ending 06/07/20 0757 Filed Weights   05/24/20 1245  Weight: 117.9 kg   Examination: Physical Exam:  General:  Pleasantly resting in bed, No acute distress. HEENT:  Normocephalic atraumatic.  Sclerae nonicteric, noninjected.  Extraocular movements intact bilaterally. Neck:  Without mass or deformity.  Trachea is midline. Lungs:  Clear to auscultate bilaterally without rhonchi, wheeze, or rales. Heart:  Regular rate and rhythm.  Without murmurs, rubs, or gallops. Abdomen:  Soft, obese, nontender, nondistended.  Without guarding or rebound. Extremities: Without cyanosis, clubbing, edema, or obvious deformity. Vascular:  Dorsalis pedis and posterior tibial pulses palpable bilaterally. Skin:  Warm and dry, no erythema, no ulcerations.   Data Reviewed: I have personally reviewed following labs and imaging studies  CBC: Recent Labs  Lab 06/01/20 0558 06/02/20 0940 06/03/20 0656 06/06/20 0544  WBC 3.3* 3.8* 3.4* 2.8*  NEUTROABS 2.5 2.9 2.6  --   HGB 8.2* 8.4* 7.9* 8.1*  HCT 26.1* 27.2* 25.8* 25.6*  MCV 96.3 96.8 96.6 95.9  PLT 179 186 178 382   Basic Metabolic Panel: Recent Labs  Lab 06/01/20 0558 06/02/20 0940 06/03/20 0656  NA 140 140 140  K 3.3* 3.5 3.3*  CL 95* 95* 95*  CO2 34* 34* 38*  GLUCOSE 129* 169* 137*  BUN 50* 52* 50*  CREATININE 2.19* 2.03* 2.45*  CALCIUM 8.4* 8.4* 8.5*  MG 2.0 1.9 1.9  PHOS 4.1 4.1 3.7   GFR: Estimated Creatinine Clearance: 26.4 mL/min (A) (by C-G formula based on SCr of 2.45 mg/dL (H)). Liver Function Tests: Recent Labs  Lab 06/01/20 0558 06/02/20 0940 06/03/20 0656  AST 18 17 15   ALT 11 11 9   ALKPHOS 84 79 83  BILITOT 0.7 0.6 0.6  PROT 6.5 6.5 6.3*  ALBUMIN 2.6* 2.7* 2.7*   No results for input(s): LIPASE,  AMYLASE in the last 168 hours. No results for input(s): AMMONIA in the last 168 hours. Coagulation Profile: No results for input(s): INR, PROTIME in the last 168 hours. Cardiac Enzymes: No results for input(s): CKTOTAL, CKMB, CKMBINDEX, TROPONINI in the last 168 hours. BNP (last 3 results) No results for input(s): PROBNP in the last 8760 hours. HbA1C: No results for input(s): HGBA1C in the last 72 hours. CBG: Recent Labs  Lab 06/06/20 0724 06/06/20 1128 06/06/20 1636 06/06/20 2112 06/07/20 0745  GLUCAP 124* 200* 155* 138* 117*   Lipid Profile: No results for input(s): CHOL, HDL, LDLCALC, TRIG, CHOLHDL, LDLDIRECT in the last 72 hours. Thyroid Function Tests: No results for input(s): TSH, T4TOTAL, FREET4, T3FREE, THYROIDAB in the last 72 hours. Anemia  Panel: No results for input(s): VITAMINB12, FOLATE, FERRITIN, TIBC, IRON, RETICCTPCT in the last 72 hours. Sepsis Labs: No results for input(s): PROCALCITON, LATICACIDVEN in the last 168 hours.  No results found for this or any previous visit (from the past 240 hour(s)).   RN Pressure Injury Documentation: Pressure Injury 05/29/20 Thigh Posterior;Proximal;Right Stage 2 -  Partial thickness loss of dermis presenting as a shallow open injury with a red, pink wound bed without slough. 3 small open areas (Active)  05/29/20 2000  Location: Thigh  Location Orientation: Posterior;Proximal;Right  Staging: Stage 2 -  Partial thickness loss of dermis presenting as a shallow open injury with a red, pink wound bed without slough.  Wound Description (Comments): 3 small open areas  Present on Admission: Yes   Estimated body mass index is 52.51 kg/m as calculated from the following:   Height as of this encounter: 4\' 11"  (1.499 m).   Weight as of this encounter: 117.9 kg.  Malnutrition Type:   Malnutrition Characteristics:   Nutrition Interventions:    Radiology Studies: No results found.  Scheduled Meds: . sodium chloride    Intravenous Once  . amLODipine  10 mg Oral Daily  . carvedilol  6.25 mg Oral BID WC  . cloNIDine  0.2 mg Oral BID WC  . diphenhydrAMINE  25 mg Oral TID  . ferrous sulfate  325 mg Oral BID WC  . furosemide  60 mg Oral BID  . hydrALAZINE  100 mg Oral Q8H  . hydrocerin  1 application Topical Daily  . hydrocortisone  25 mg Rectal Daily  . metolazone  5 mg Oral Daily  . nutrition supplement (JUVEN)  1 packet Oral BID BM  . polyethylene glycol  17 g Oral Daily  . sodium bicarbonate  1,300 mg Oral BID WC   Continuous Infusions:   LOS: 13 days   Little Ishikawa, DO [facility] PAGER is on AMION  If 7PM-7AM, please contact night-coverage [email or web address]

## 2020-06-08 ENCOUNTER — Ambulatory Visit
Admit: 2020-06-08 | Discharge: 2020-06-08 | Disposition: A | Discharge status: Home / Self Care | Payer: Medicare Other | Attending: Radiation Oncology | Admitting: Radiation Oncology

## 2020-06-08 DIAGNOSIS — N939 Abnormal uterine and vaginal bleeding, unspecified: Secondary | ICD-10-CM | POA: Diagnosis not present

## 2020-06-08 DIAGNOSIS — C541 Malignant neoplasm of endometrium: Secondary | ICD-10-CM | POA: Diagnosis not present

## 2020-06-08 LAB — COMPREHENSIVE METABOLIC PANEL
ALT: 11 U/L (ref 0–44)
AST: 17 U/L (ref 15–41)
Albumin: 3.1 g/dL — ABNORMAL LOW (ref 3.5–5.0)
Alkaline Phosphatase: 84 U/L (ref 38–126)
Anion gap: 11 (ref 5–15)
BUN: 68 mg/dL — ABNORMAL HIGH (ref 8–23)
CO2: 36 mmol/L — ABNORMAL HIGH (ref 22–32)
Calcium: 8.8 mg/dL — ABNORMAL LOW (ref 8.9–10.3)
Chloride: 93 mmol/L — ABNORMAL LOW (ref 98–111)
Creatinine, Ser: 1.96 mg/dL — ABNORMAL HIGH (ref 0.44–1.00)
GFR, Estimated: 28 mL/min — ABNORMAL LOW (ref >60)
Glucose, Bld: 126 mg/dL — ABNORMAL HIGH (ref 70–99)
Potassium: 2.8 mmol/L — ABNORMAL LOW (ref 3.5–5.1)
Sodium: 140 mmol/L (ref 135–145)
Total Bilirubin: 0.8 mg/dL (ref 0.3–1.2)
Total Protein: 7 g/dL (ref 6.5–8.1)

## 2020-06-08 LAB — CBC
HCT: 26.6 % — ABNORMAL LOW (ref 36.0–46.0)
Hemoglobin: 8.3 g/dL — ABNORMAL LOW (ref 12.0–15.0)
MCH: 29.9 pg (ref 26.0–34.0)
MCHC: 31.2 g/dL (ref 30.0–36.0)
MCV: 95.7 fL (ref 80.0–100.0)
Platelets: 158 10*3/uL (ref 150–400)
RBC: 2.78 MIL/uL — ABNORMAL LOW (ref 3.87–5.11)
RDW: 15.2 % (ref 11.5–15.5)
WBC: 3.3 10*3/uL — ABNORMAL LOW (ref 4.0–10.5)
nRBC: 0 % (ref 0.0–0.2)

## 2020-06-08 LAB — GLUCOSE, CAPILLARY
Glucose-Capillary: 113 mg/dL — ABNORMAL HIGH (ref 70–99)
Glucose-Capillary: 204 mg/dL — ABNORMAL HIGH (ref 70–99)
Glucose-Capillary: 130 mg/dL — ABNORMAL HIGH (ref 70–99)

## 2020-06-08 NOTE — Care Management Important Message (Signed)
Important Message  Patient Details IM Letter given to the Patient. Name: Vanessa [name] MRN: [id number] Date of Birth: 21-Jun-1956   Medicare Important Message Given:  Yes     Kerin Salen 06/08/2020, 9:17 AM

## 2020-06-08 NOTE — Progress Notes (Signed)
Physical Therapy Treatment Patient Details Name: Vanessa [name] MRN: [id number] DOB: 03-13-1956 Today's Date: 06/08/2020    History of Present Illness Valory Wetherby is an 64 y.o. female with PMH significant for recent diagnosis of endometrial CA, HTN, CKD 3, diet-controlled diabetes and recent admission for lower extremity cellulitis who was sent over from her rehab facility for significant increase in her vaginal bleeding.  Pt now receiving radiation treatments.    PT Comments    Pt making gradual progress.  Able to increase gait with standing rest breaks and min cues for RW use.  Good participation with closed chain exercises with rest breaks and cues for technique.  Continue plan of care.     Follow Up Recommendations  Home health PT;Supervision for mobility/OOB (Recommended SNF but insurance denied)     Equipment Recommendations  None recommended by PT (has RW and BSC)    Recommendations for Other Services       Precautions / Restrictions Precautions Precautions: Fall Restrictions Weight Bearing Restrictions: No    Mobility  Bed Mobility               General bed mobility comments: in chair at arrival  Transfers Overall transfer level: Needs assistance Equipment used: Rolling walker (2 wheeled) Transfers: Sit to/from Stand Sit to Stand: Supervision         General transfer comment: supervision for safety; sit to stand x 4 for transfers throughout session  Ambulation/Gait Ambulation/Gait assistance: Supervision Gait Distance (Feet): 120 Feet Assistive device: Rolling walker (2 wheeled) Gait Pattern/deviations: Step-through pattern;Decreased stride length;Trunk flexed Gait velocity: decr   General Gait Details: Cues for RW proximity and posture; 1 standing rest break   Stairs             Wheelchair Mobility    Modified Rankin (Stroke Patients Only)       Balance Overall balance assessment: Needs assistance Sitting-balance support:  Feet supported Sitting balance-Leahy Scale: Good     Standing balance support: Bilateral upper extremity supported;No upper extremity supported Standing balance-Leahy Scale: Fair Standing balance comment: ambulation with RW, pericare and handwashing without UE support, UE support for standing exercises                            Cognition Arousal/Alertness: Awake/alert Behavior During Therapy: WFL for tasks assessed/performed Overall Cognitive Status: Within Functional Limits for tasks assessed                                        Exercises Other Exercises Other Exercises: sit to stand from recliner x6 rep, B UE assist to power up (in addition to transfers) Other Exercises: heel raises x 10 with HHA, Standing marching with RW x 10 with cues for increased ROM    General Comments General comments (skin integrity, edema, etc.): Discussed HEP and stair training technique (pt has 1 curb - educated on use of RW and assist)      Pertinent Vitals/Pain Pain Assessment: No/denies pain    Home Living                      Prior Function            PT Goals (current goals can now be found in the care plan section) Acute Rehab PT Goals Patient Stated Goal: build more strength PT  Goal Formulation: With patient Time For Goal Achievement: 06/10/20 Potential to Achieve Goals: Good Progress towards PT goals: Progressing toward goals    Frequency    Min 3X/week      PT Plan Current plan remains appropriate    Co-evaluation              AM-PAC PT "6 Clicks" Mobility   Outcome Measure  Help needed turning from your back to your side while in a flat bed without using bedrails?: A Little Help needed moving from lying on your back to sitting on the side of a flat bed without using bedrails?: A Little Help needed moving to and from a bed to a chair (including a wheelchair)?: None Help needed standing up from a chair using your arms (e.g.,  wheelchair or bedside chair)?: None Help needed to walk in hospital room?: None Help needed climbing 3-5 steps with a railing? : A Little 6 Click Score: 21    End of Session Equipment Utilized During Treatment: Gait belt Activity Tolerance: Patient tolerated treatment well Patient left: with call bell/phone within reach;in chair;with chair alarm set Nurse Communication: Mobility status PT Visit Diagnosis: Difficulty in walking, not elsewhere classified (R26.2);Unsteadiness on feet (R26.81)     Time: 3825-0539 PT Time Calculation (min) (ACUTE ONLY): 23 min  Charges:  $Gait Training: 8-22 mins $Therapeutic Exercise: 8-22 mins                     Abran Richard, PT Acute Rehab Services Pager [phone] [name] [place] 06/08/2020, 11:32 AM

## 2020-06-08 NOTE — Progress Notes (Signed)
PROGRESS NOTE    Vanessa [name]  LHT:[id number] DOB: 01-13-1956 DOA: 05/24/2020 PCP: Pcp, No   Brief Narrative:  HPI per Dr. Bonnell Public on 05/24/20 Vanessa [name] is an 64 y.o. female with PMH significant for recent diagnosis of endometrial CA, HTN, CKD 3, diet-controlled diabetes and recent admission for lower extremity cellulitis who was sent over from her rehab facility for significant increase in her vaginal bleeding.  Patient apparently has chronic vaginal bleeding however over the past couple of days apparently she has been discharging a lot of clots and much more bleeding than usual.  Patient herself is unaware of this as she spends most of her day in bed and wears a diaper.  She was alarmed however when she saw how much blood there was in her diaper earlier today.  She agreed with staff at the rehab facility that she needed to come to the ED. Patient was discussed with GYN in the ED, they recommended admission to hospitalist with comanagement by them.  They will manage her vaginal bleeding.  Interim History  She was Admitted with symptomatic anemia, vaginal bleeding due to endometrial cancer.  Gynecology oncology consulted as well as radiation oncology.  Patient was transfused 4 units total as her hemoglobin dropped again to 6.8 the day before yesterday (2 units given).  She was transferred to [facility] for radiation therapy.  She has undergone 5 radiation treatment and has been transfused a total of 5 units of PRBCs. Unfortunately her insurance has denied authorization for SNF and when I called for peer-to-peer it would not let me connect to anybody live and I did not get to discuss the case with anyone today.  05/30/2020 Renal function initially improving, continues to have bleeding, Foley removed  05/31/2020 Continues radiation, vaginal bleeding markedly improving  06/01/20  Unsafe disposition, family unable to take patient home current state, SNF declined admission.   Continues to have transportation issues for radiation, disposition planning ongoing -last radiation treatment last radiation treatment on November 30  11/23 -to present Patient continues to advance fairly well with physical therapy, hoping she will improve by 30th on her last day of radiation so that she can return home safely with home health/supportive care.   Assessment & Plan:   Principal Problem:   Abnormal uterine bleeding due to primary malignant neoplasm of endometrium (HCC) Active Problems:   DM type 2 (diabetes mellitus, type 2) (HCC)   Dyslipidemia   Symptomatic anemia   Benign hypertension with CKD (chronic kidney disease) stage III (HCC)   Endometrial cancer (HCC)   Iron deficiency anemia due to chronic blood loss   Edema  Symptomatic anemia/ Acute on chronic normocytic anemia/ Vaginal bleeding secondary to endometrial cancer, improved -Vaginal bleeding has resolved at this point -S/p total 5 units of PRBC's since admission -Continue to Hold Aspirin, Continue iron supplementation with Ferrous Sulfate 325 mg po BID -Gyn Onc and radiation oncology consultations following along - appreciate insight and recommendations -Radiation oncology recommended 10 radiation treatments and transfer to [facility]; last day of therapy 11/30th -Unfortunately she has been denied for SNF and due to transport issues cannot get to radiation from home. -We will reach out to Gyn-Oncology to make sure they don't have any other recommendations prior to [place]; she was advised to have treatment recommendations for radiation in August 2021 but failed to show 5 different appointments that were made with Dr. Dwyane Dee due to transportation issues. Dr. Delsa Sale recommending no additional GYN recommendations -  Per Dr. Denman George she is not a candidate for hysterectomy and has expelled her IUD.  She do not feel that she will have much response to the replaced IUD or oral progestins  Hemorrhoids -We  will continue HC suppositories -There is some concern that hemorrhoids may be contributing to bleeding -We will need to follow this closely, will ask nurses to assess when they change her.  Abnormal EKG Regular narrow complex bradycardia read as sinus by EKG machine  P wave and T waves are diffusely flattened so is difficult for me to confirm that QTC is elevated at 507, will discontinue Levaquin (unclear indication for starting in outpatient setting) This is without any change from previous EKG from last month.  Chronic Kidney Disease, Stage IV, stable and improving  -BUN/Creatinine appears to be somewhat better than baseline -Patient had a Foley catheter but this got dislodged and will hold off replacing for now -Avoid further Nephrotoxic Medications, Contrast Dyes, Hypotension and Renally adjust Medications -Follow labs only as needed at this point  Essential HTN -Moderately well controlled at this point -We will Continue Amlodipine 10, Conidine 0.2 mg twice daily, Carvedilol 6.25 mg po BID and Hydralazine 100 mg q8h, Furosemide 60 mg po BID and Metolazone 5  Diet Controlled DM2 -Patient does not appear to be on any antidiabetic medicine -Last HbA1c was 5.9 on 04/22/20 -Will place patient on carb controlled diet and check fingersticks  -SSI not ordered -Continue to monitor blood sugars carefully; CBG's ranging from 104-144 -Blood sugars ranging from 105-141 on daily BMPs  Stage II Sacral Ulcer -Present on Admission -Wound care consulted  Deconditioning, profound ambulatory dysfunction -PT consulted for further evaluation and recommending SNF but unfortunately she was denied Authorization and When I called for the Peer-to-Peer I was not connected to anyone live and was on hold for 20 minutes -PT/OT to continue to work with the patient and she continues to improve daily, ambulating further with less assistance per their documentation  Hypokalemia, resolved -Continue to Monitor  and and Replete as Necessary -Repeat CMP in the AM   Leukopenia, stable -Mild with a WBC of 2.8 -Continue to Monitor and Trend  Super Morbid Obesity -Complicates overall prognosis and care -Estimated body mass index is 52.51 kg/m as calculated from the following:   Height as of this encounter: 4\' 11"  (1.499 m).   Weight as of this encounter: 117.9 kg. -Weight Loss and Dietary Counseling given   DVT prophylaxis: SCDs only given Vaginal Bleeding  Code Status: FULL CODE  Family Communication: No family present at bedside  Disposition Plan: Likely discharge home on the 30th after completion of radiation ending improvement in ambulation and safe disposition  Status is: Inpatient  Remains inpatient appropriate because:Ongoing diagnostic testing needed not appropriate for outpatient work up, Unsafe d/c plan, IV treatments appropriate due to intensity of illness or inability to take PO and Inpatient level of care appropriate due to severity of illness   Dispo: The patient is from: Home              Anticipated d/c is to: SNF denied - dispo home November 30 after completing radiation              Anticipated d/c date is: 06/09/2020              Patient currently is medically stable to d/c.  Currently remains in the hospital for radiation treatment due to transportation issues and unable to discharge to skilled nursing facility.  Consultants:   Gyn-Oncology   Radiation-Oncology    Procedures: Radiation   Antimicrobials:  Anti-infectives (From admission, onward)   Start     Dose/Rate Route Frequency Ordered Stop   05/25/20 1800  levofloxacin (LEVAQUIN) tablet 750 mg  Status:  Discontinued        750 mg Oral Every 48 hours 05/24/20 1549 05/24/20 1610       Subjective: Worsening episodes of watery diarrhea overnight, denies any ongoing vaginal bleeding, nausea, vomiting, constipation, headache, fevers, chills.  Objective: Vitals:   06/07/20 0512 06/07/20 1334 06/07/20 2135  06/08/20 0538  BP: (!) 171/76 (!) 154/78 140/74 (!) 153/68  Pulse: 70 65 64 70  Resp: 15 18 18 17   Temp: 98.1 F (36.7 C) 97.9 F (36.6 C) 97.8 F (36.6 C) 98.1 F (36.7 C)  TempSrc: Oral Oral Oral Oral  SpO2: 95% 100% 100% 98%  Weight:      Height:       No intake or output data in the 24 hours ending 06/08/20 0747 Filed Weights   05/24/20 1245  Weight: 117.9 kg   Examination: Physical Exam:  General:  Pleasantly resting in bed, No acute distress. HEENT:  Normocephalic atraumatic.  Sclerae nonicteric, noninjected.  Extraocular movements intact bilaterally. Neck:  Without mass or deformity.  Trachea is midline. Lungs:  Clear to auscultate bilaterally without rhonchi, wheeze, or rales. Heart:  Regular rate and rhythm.  Without murmurs, rubs, or gallops. Abdomen:  Soft, obese, nontender, nondistended.  Without guarding or rebound. Extremities: Without cyanosis, clubbing, edema, or obvious deformity. Vascular:  Dorsalis pedis and posterior tibial pulses palpable bilaterally. Skin:  Warm and dry, no erythema, no ulcerations.   Data Reviewed: I have personally reviewed following labs and imaging studies  CBC: Recent Labs  Lab 06/02/20 0940 06/03/20 0656 06/06/20 0544 06/08/20 0643  WBC 3.8* 3.4* 2.8* 3.3*  NEUTROABS 2.9 2.6  --   --   HGB 8.4* 7.9* 8.1* 8.3*  HCT 27.2* 25.8* 25.6* 26.6*  MCV 96.8 96.6 95.9 95.7  PLT 186 178 168 767   Basic Metabolic Panel: Recent Labs  Lab 06/02/20 0940 06/03/20 0656 06/08/20 0643  NA 140 140 140  K 3.5 3.3* 2.8*  CL 95* 95* 93*  CO2 34* 38* 36*  GLUCOSE 169* 137* 126*  BUN 52* 50* 68*  CREATININE 2.03* 2.45* 1.96*  CALCIUM 8.4* 8.5* 8.8*  MG 1.9 1.9  --   PHOS 4.1 3.7  --    GFR: Estimated Creatinine Clearance: 33 mL/min (A) (by C-G formula based on SCr of 1.96 mg/dL (H)). Liver Function Tests: Recent Labs  Lab 06/02/20 0940 06/03/20 0656 06/08/20 0643  AST 17 15 17   ALT 11 9 11   ALKPHOS 79 83 84  BILITOT 0.6  0.6 0.8  PROT 6.5 6.3* 7.0  ALBUMIN 2.7* 2.7* 3.1*   No results for input(s): LIPASE, AMYLASE in the last 168 hours. No results for input(s): AMMONIA in the last 168 hours. Coagulation Profile: No results for input(s): INR, PROTIME in the last 168 hours. Cardiac Enzymes: No results for input(s): CKTOTAL, CKMB, CKMBINDEX, TROPONINI in the last 168 hours. BNP (last 3 results) No results for input(s): PROBNP in the last 8760 hours. HbA1C: No results for input(s): HGBA1C in the last 72 hours. CBG: Recent Labs  Lab 06/06/20 2112 06/07/20 0745 06/07/20 1146 06/07/20 1641 06/07/20 2132  GLUCAP 138* 117* 159* 135* 169*   Lipid Profile: No results for input(s): CHOL, HDL, LDLCALC, TRIG, CHOLHDL, LDLDIRECT in the  last 72 hours. Thyroid Function Tests: No results for input(s): TSH, T4TOTAL, FREET4, T3FREE, THYROIDAB in the last 72 hours. Anemia Panel: No results for input(s): VITAMINB12, FOLATE, FERRITIN, TIBC, IRON, RETICCTPCT in the last 72 hours. Sepsis Labs: No results for input(s): PROCALCITON, LATICACIDVEN in the last 168 hours.  No results found for this or any previous visit (from the past 240 hour(s)).   RN Pressure Injury Documentation: Pressure Injury 05/29/20 Thigh Posterior;Proximal;Right Stage 2 -  Partial thickness loss of dermis presenting as a shallow open injury with a red, pink wound bed without slough. 3 small open areas (Active)  05/29/20 2000  Location: Thigh  Location Orientation: Posterior;Proximal;Right  Staging: Stage 2 -  Partial thickness loss of dermis presenting as a shallow open injury with a red, pink wound bed without slough.  Wound Description (Comments): 3 small open areas  Present on Admission: Yes   Estimated body mass index is 52.51 kg/m as calculated from the following:   Height as of this encounter: 4\' 11"  (1.499 m).   Weight as of this encounter: 117.9 kg.  Malnutrition Type:   Malnutrition Characteristics:   Nutrition  Interventions:    Radiology Studies: No results found.  Scheduled Meds:  sodium chloride   Intravenous Once   amLODipine  10 mg Oral Daily   carvedilol  6.25 mg Oral BID WC   cloNIDine  0.2 mg Oral BID WC   diphenhydrAMINE  25 mg Oral TID   ferrous sulfate  325 mg Oral BID WC   furosemide  60 mg Oral BID   hydrALAZINE  100 mg Oral Q8H   hydrocerin  1 application Topical Daily   hydrocortisone  25 mg Rectal Daily   metolazone  5 mg Oral Daily   nutrition supplement (JUVEN)  1 packet Oral BID BM   sodium bicarbonate  1,300 mg Oral BID WC   Continuous Infusions:   LOS: 14 days   Little Ishikawa, DO [facility] PAGER is on AMION  If 7PM-7AM, please contact night-coverage [email or web address]

## 2020-06-09 ENCOUNTER — Encounter: Payer: Self-pay | Admitting: Radiation Oncology

## 2020-06-09 ENCOUNTER — Ambulatory Visit
Admit: 2020-06-09 | Discharge: 2020-06-09 | Disposition: A | Discharge status: Home / Self Care | Payer: Medicare Other | Attending: Radiation Oncology | Admitting: Radiation Oncology

## 2020-06-09 ENCOUNTER — Ambulatory Visit: Payer: Medicare Other

## 2020-06-09 LAB — CBC
HCT: 27.3 % — ABNORMAL LOW (ref 36.0–46.0)
Hemoglobin: 8.5 g/dL — ABNORMAL LOW (ref 12.0–15.0)
MCH: 29.9 pg (ref 26.0–34.0)
MCHC: 31.1 g/dL (ref 30.0–36.0)
MCV: 96.1 fL (ref 80.0–100.0)
Platelets: 167 10*3/uL (ref 150–400)
RBC: 2.84 MIL/uL — ABNORMAL LOW (ref 3.87–5.11)
RDW: 15.4 % (ref 11.5–15.5)
WBC: 3.7 10*3/uL — ABNORMAL LOW (ref 4.0–10.5)
nRBC: 0 % (ref 0.0–0.2)

## 2020-06-09 LAB — COMPREHENSIVE METABOLIC PANEL
ALT: 12 U/L (ref 0–44)
AST: 17 U/L (ref 15–41)
Albumin: 3.1 g/dL — ABNORMAL LOW (ref 3.5–5.0)
Alkaline Phosphatase: 82 U/L (ref 38–126)
Anion gap: 11 (ref 5–15)
BUN: 72 mg/dL — ABNORMAL HIGH (ref 8–23)
CO2: 36 mmol/L — ABNORMAL HIGH (ref 22–32)
Calcium: 8.7 mg/dL — ABNORMAL LOW (ref 8.9–10.3)
Chloride: 93 mmol/L — ABNORMAL LOW (ref 98–111)
Creatinine, Ser: 2.46 mg/dL — ABNORMAL HIGH (ref 0.44–1.00)
GFR, Estimated: 21 mL/min — ABNORMAL LOW (ref >60)
Glucose, Bld: 157 mg/dL — ABNORMAL HIGH (ref 70–99)
Potassium: 2.7 mmol/L — CL (ref 3.5–5.1)
Sodium: 140 mmol/L (ref 135–145)
Total Bilirubin: 0.7 mg/dL (ref 0.3–1.2)
Total Protein: 7.2 g/dL (ref 6.5–8.1)

## 2020-06-09 LAB — GLUCOSE, CAPILLARY
Glucose-Capillary: 133 mg/dL — ABNORMAL HIGH (ref 70–99)
Glucose-Capillary: 229 mg/dL — ABNORMAL HIGH (ref 70–99)

## 2020-06-09 MED ORDER — POTASSIUM CHLORIDE CRYS ER 20 MEQ PO TBCR
40.0000 meq | EXTENDED_RELEASE_TABLET | ORAL | Status: DC
Start: 2020-06-09 — End: 2020-06-09

## 2020-06-09 MED ORDER — POTASSIUM CHLORIDE CRYS ER 20 MEQ PO TBCR
40.0000 meq | EXTENDED_RELEASE_TABLET | ORAL | Status: AC
  Administered 2020-06-09 (×3): 40 meq via ORAL
  Filled 2020-06-09 (×3): qty 2

## 2020-06-09 NOTE — Progress Notes (Signed)
Physical Therapy Treatment Patient Details Name: Vanessa [name] MRN: [id number] DOB: March 09, 1956 Today's Date: 06/09/2020    History of Present Illness Vanessa [name] is an 64 y.o. female with PMH significant for recent diagnosis of endometrial CA, HTN, CKD 3, diet-controlled diabetes and recent admission for lower extremity cellulitis who was sent over from her rehab facility for significant increase in her vaginal bleeding.  Pt now receiving radiation treatments.    PT Comments    Pt demonstrating good progress with ambulation with RW and supervision.  She was unable to perform 8" platform step ; however, she only has 1 curb to enter her home that is likely 4-6" high.  Will continue to benefit from therapy to advance as able.    Follow Up Recommendations  Home health PT;Supervision for mobility/OOB (insurance denied SNF)     Equipment Recommendations  None recommended by PT    Recommendations for Other Services       Precautions / Restrictions Precautions Precautions: Fall Precaution Comments: vaginal bleeding Restrictions Weight Bearing Restrictions: No    Mobility  Bed Mobility Overal bed mobility: Needs Assistance Bed Mobility: Sit to Supine           General bed mobility comments: Patient seated in recliner upon entry.   Transfers Overall transfer level: Needs assistance Equipment used: Rolling walker (2 wheeled) Transfers: Sit to/from Stand Sit to Stand: Supervision         General transfer comment: Performed x 2 without physical assist  Ambulation/Gait Ambulation/Gait assistance: Supervision Gait Distance (Feet): 120 Feet Assistive device: Rolling walker (2 wheeled) Gait Pattern/deviations: Step-through pattern;Decreased stride length;Trunk flexed Gait velocity: decr   General Gait Details: Cues for RW proximity and posture; no rest breaks with ambulation today   Stairs Stairs: Yes       General stair comments: Attempted 8" platform step  with RW forward and backward.  Pt able to get R leg up but reports step is too high to push self up.  She has 1 curb to enter home that is likely 4-6". Smaller platform was unavailable at this time.   Wheelchair Mobility    Modified Rankin (Stroke Patients Only)       Balance Overall balance assessment: Needs assistance Sitting-balance support: Feet supported Sitting balance-Leahy Scale: Good     Standing balance support: Bilateral upper extremity supported;No upper extremity supported Standing balance-Leahy Scale: Fair Standing balance comment: ambulation with RW, pericare and handwashing without UE support, UE support for standing exercises                            Cognition Arousal/Alertness: Awake/alert Behavior During Therapy: WFL for tasks assessed/performed Overall Cognitive Status: Within Functional Limits for tasks assessed                                        Exercises      General Comments        Pertinent Vitals/Pain Pain Assessment: No/denies pain    Home Living                      Prior Function            PT Goals (current goals can now be found in the care plan section) Acute Rehab PT Goals Patient Stated Goal: build more strength PT Goal Formulation: With  patient Time For Goal Achievement: 06/10/20 Potential to Achieve Goals: Good Progress towards PT goals: Progressing toward goals    Frequency    Min 3X/week      PT Plan Current plan remains appropriate    Co-evaluation              AM-PAC PT "6 Clicks" Mobility   Outcome Measure  Help needed turning from your back to your side while in a flat bed without using bedrails?: None Help needed moving from lying on your back to sitting on the side of a flat bed without using bedrails?: None Help needed moving to and from a bed to a chair (including a wheelchair)?: None Help needed standing up from a chair using your arms (e.g., wheelchair  or bedside chair)?: None Help needed to walk in hospital room?: None Help needed climbing 3-5 steps with a railing? : A Lot 6 Click Score: 22    End of Session Equipment Utilized During Treatment: Gait belt Activity Tolerance: Patient tolerated treatment well Patient left: with call bell/phone within reach;in chair;with chair alarm set Nurse Communication: Mobility status PT Visit Diagnosis: Difficulty in walking, not elsewhere classified (R26.2);Unsteadiness on feet (R26.81)     Time: 4944-9675 PT Time Calculation (min) (ACUTE ONLY): 21 min  Charges:  $Gait Training: 8-22 mins                     Abran Richard, PT Acute Rehab Services Pager [phone] [name] [place] 06/09/2020, 11:49 AM

## 2020-06-09 NOTE — Progress Notes (Signed)
Patient provided AVS documentation. Patient will be leaving via uber to hotel that daughter in law bought. Patient has belongings at bedside. Waiting for transportation to arrive.

## 2020-06-09 NOTE — Progress Notes (Signed)
Occupational Therapy Treatment Patient Details Name: Vanessa [name] MRN: [id number] DOB: [birth year] Today's Date: 06/09/2020    History of present illness Vanessa [name] is an 64 y.o. female with PMH significant for recent diagnosis of endometrial CA, HTN, CKD 3, diet-controlled diabetes and recent admission for lower extremity cellulitis who was sent over from her rehab facility for significant increase in her vaginal bleeding.  Pt now receiving radiation treatments.   OT comments  OT treatment session with focus on self-care re-education, functional transfers and energy conservation techniques. Patient is making good progress toward goals demonstrating ADL transfers, short-distance ambulation, and grooming standing at sink level with supervision A. Patient would benefit from continued acute OT services to maximize safety and independence with self-care tasks in prep for safe d/c home with [facility] and assist from spouse.    Follow Up Recommendations  Home health OT;Supervision - Intermittent    Equipment Recommendations  None recommended by OT    Recommendations for Other Services      Precautions / Restrictions Precautions Precautions: Fall Precaution Comments: vaginal bleeding Restrictions Weight Bearing Restrictions: No       Mobility Bed Mobility Overal bed mobility: Needs Assistance Bed Mobility: Sit to Supine           General bed mobility comments: Patient seated in recliner upon entry.   Transfers Overall transfer level: Needs assistance Equipment used: Rolling walker (2 wheeled) Transfers: Sit to/from Stand Sit to Stand: Supervision         General transfer comment: Sit to stand x2 with S for sfaety and cues for walker management.     Balance Overall balance assessment: Needs assistance Sitting-balance support: Feet supported Sitting balance-Leahy Scale: Good     Standing balance support: Bilateral upper extremity supported;No upper extremity  supported Standing balance-Leahy Scale: Fair Standing balance comment: ambulation with RW, pericare and handwashing without UE support, UE support for standing exercises                           ADL either performed or assessed with clinical judgement   ADL Overall ADL's : Needs assistance/impaired     Grooming: Oral care;Standing;Brushing hair;Wash/dry hands;Wash/dry face Grooming Details (indicate cue type and reason): Patient completed 3/3 grooming tasks standing at sink level with supervision A for safety with cues for walker management. Education on energy conservation techniques including sitting to complete tasks on days when she is more fatigued.                              Functional mobility during ADLs: Supervision/safety;Rolling walker       Vision       Perception     Praxis      Cognition Arousal/Alertness: Awake/alert Behavior During Therapy: WFL for tasks assessed/performed Overall Cognitive Status: Within Functional Limits for tasks assessed                                          Exercises     Shoulder Instructions       General Comments      Pertinent Vitals/ Pain       Pain Assessment: No/denies pain  Home Living  Prior Functioning/Environment              Frequency  Min 2X/week        Progress Toward Goals  OT Goals(current goals can now be found in the care plan section)  Progress towards OT goals: Progressing toward goals  Acute Rehab OT Goals Patient Stated Goal: build more strength OT Goal Formulation: With patient Time For Goal Achievement: 06/14/20 Potential to Achieve Goals: Good ADL Goals Pt Will Perform Lower Body Bathing: sit to/from stand;with adaptive equipment;with modified independence Pt Will Perform Lower Body Dressing: sit to/from stand;with adaptive equipment;with modified independence Pt Will Transfer to  Toilet: ambulating;bedside commode;with modified independence Pt Will Perform Toileting - Clothing Manipulation and hygiene: with modified independence Pt/caregiver will Perform Home Exercise Program: Increased strength;Right Upper extremity;Left upper extremity;With written HEP provided;With theraband;With theraputty;Independently Additional ADL Goal #1: Patient will stand at sink to perform grooming task as evidence of improving activity tolerance  Plan Discharge plan remains appropriate    Co-evaluation                 AM-PAC OT "6 Clicks" Daily Activity     Outcome Measure   Help from another person eating meals?: A Little Help from another person taking care of personal grooming?: A Little Help from another person toileting, which includes using toliet, bedpan, or urinal?: A Little Help from another person bathing (including washing, rinsing, drying)?: A Lot Help from another person to put on and taking off regular upper body clothing?: A Little Help from another person to put on and taking off regular lower body clothing?: A Little 6 Click Score: 17    End of Session Equipment Utilized During Treatment: Rolling walker;Gait belt  OT Visit Diagnosis: Other abnormalities of gait and mobility (R26.89);Muscle weakness (generalized) (M62.81)   Activity Tolerance Patient tolerated treatment well   Patient Left with call bell/phone within reach;with bed alarm set;Other (comment) (Seated EOB, RN aware)   Nurse Communication Mobility status        Time: [phone] OT Time Calculation (min): 19 min  Charges: OT General Charges $OT Visit: 1 Visit OT Treatments $Self Care/Home Management : 8-22 mins  Miley Lindon H. OTR/L Supplemental OT, Department of rehab services [phone]   Jossalin Chervenak R H. 06/09/2020, 9:03 AM

## 2020-06-09 NOTE — Progress Notes (Signed)
CRITICAL VALUE ALERT  Critical Value:  Potassium 2.7  Date & Time Notied:  06/09/2020 at [place]   Provider Notified: Dr. Avon Gully

## 2020-06-09 NOTE — Discharge Summary (Signed)
Physician Discharge Summary  Vanessa [name] XLK:[id number] DOB: 08/08/1955 DOA: 05/24/2020  PCP: Pcp, No  Admit date: 05/24/2020 Discharge date: 06/09/2020  Admitted From: Home Disposition: Home   Recommendations for Outpatient Follow-up:  1. Follow up with PCP in 1-2 weeks 2. Please obtain BMP/CBC in one week:  Home Health: Home health PT Equipment/Devices: None  Discharge Condition: Stable CODE STATUS: Full  Diet recommendation: As tolerated  Brief/Interim Summary: Vanessa Dechellis [name] an 64 y.o.femalewith PMH significant for recent diagnosis of endometrial CA, HTN, CKD 3, diet-controlled diabetes and recent admission for lower extremity cellulitis who was sent over from her rehab facility for significant increase in her vaginal bleeding. Patient apparently has chronic vaginal bleeding however over the past couple of days apparently she has been discharging a lot of clots and much more bleeding than usual. Patient herself is unaware of this as she spends most of her day in bed and wears a diaper. She was alarmed however when she saw how much blood there was in her diaper earlier today. She agreed with staff at the rehab facility that she needed to come to the ED.Patient was discussed with GYN in the ED, they recommended admission to hospitalist with comanagement by them. They will manage her vaginal bleeding.  She was Admitted with symptomatic anemia, vaginal bleeding due to endometrial cancer. Gynecology oncology consulted as well as radiation oncology. Patient was transfused 4 units total as her hemoglobin dropped again to 6.8 the day before yesterday (2 units given). She was transferred to [facility] for radiation therapy.  She has undergone 5 radiation treatment and has been transfused a total of 5 units of PRBCs. Unfortunately her insurance has denied authorization for SNF -she remained in-house to complete her radiation therapy through 06/09/2020 for radiation oncology.  At  this point patient's hemoglobin has remained stable, she has not completed therapy, improving with physical therapy enough that she is now able to discharge home with home health.  Patient otherwise stable and agreeable for discharge home.  Repeat labs with PCP in the next 3 to 5 days.  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 (HCC) Active Problems:   DM type 2 (diabetes mellitus, type 2) (HCC)   Dyslipidemia   Symptomatic anemia   Benign hypertension with CKD (chronic kidney disease) stage III (HCC)   Endometrial cancer (HCC)   Iron deficiency anemia due to chronic blood loss   Edema    Discharge Instructions  Discharge Instructions    Call MD for:  severe uncontrolled pain   Complete by: As directed    Call MD for:  temperature >100.4   Complete by: As directed    Diet - low sodium heart healthy   Complete by: As directed    Discharge wound care:   Complete by: As directed    Place a small piece of Aquacel Advantage Vanessa [name] # [phone]) to the gluteal fold, cover with sacral foam. Change daily or PRN soiling   Increase activity slowly   Complete by: As directed      Allergies as of 06/09/2020      Reactions   Ace Inhibitors Other (See Comments)   Never prescribed but she has had angioedema   Angiotensin Receptor Blockers Other (See Comments)   ARB never Rxed but PMH  of angioedema in March 2021      Medication List    STOP taking these medications   levofloxacin 750 MG  tablet Commonly known as: LEVAQUIN     TAKE these medications   acetaminophen 500 MG tablet Commonly known as: TYLENOL Take 500 mg by mouth every 6 (six) hours as needed (pain).   amLODipine 10 MG tablet Commonly known as: NORVASC Take 1 tablet (10 mg total) by mouth daily.   aspirin 81 MG chewable tablet Chew 81 mg by mouth daily.   carvedilol 6.25 MG tablet Commonly known as: COREG Take 1 tablet (6.25 mg total) by  mouth 2 (two) times daily with a meal.   cloNIDine 0.2 MG tablet Commonly known as: CATAPRES Take 1 tablet (0.2 mg total) by mouth 2 (two) times daily. What changed: when to take this   diphenhydrAMINE 25 MG tablet Commonly known as: BENADRYL Take 25 mg by mouth See admin instructions. Take one tablet (25 mg) by mouth three times daily, may also take one tablet (25 mg) every 8 hours as needed for itching for 14 days - 05/15/2020 thru 05/29/2020   feeding supplement (PRO-STAT SUGAR FREE 64) Liqd Take 30 mLs by mouth 3 (three) times daily with meals.   ferrous sulfate 325 (65 FE) MG tablet Take 1 tablet (325 mg total) by mouth 2 (two) times daily with a meal.   furosemide 20 MG tablet Commonly known as: LASIX Take 60 mg by mouth 2 (two) times daily. 9am, 6pm   hydrALAZINE 100 MG tablet Commonly known as: APRESOLINE Take 1 tablet (100 mg total) by mouth every 8 (eight) hours.   hydrocerin Crea Apply 1 application topically daily.   hydrocortisone 25 MG suppository Commonly known as: ANUSOL-HC Place 25 mg rectally daily.   loratadine 10 MG tablet Commonly known as: CLARITIN Take 1 tablet (10 mg total) by mouth daily as needed for allergies.   metolazone 5 MG tablet Commonly known as: ZAROXOLYN Take 5 mg by mouth daily.   multivitamin with minerals Tabs tablet Take 1 tablet by mouth daily.   nutrition supplement (JUVEN) Pack Take 1 packet by mouth 2 (two) times daily between meals.   polyethylene glycol 17 g packet Commonly known as: MIRALAX / GLYCOLAX Take 17 g by mouth daily.   Proctozone-HC 2.5 % rectal cream Generic drug: hydrocortisone Place 1 application rectally every 12 (twelve) hours as needed for hemorrhoids.   sodium bicarbonate 650 MG tablet Take 2 tablets (1,300 mg total) by mouth 2 (two) times daily. What changed: when to take this            Discharge Care Instructions  (From admission, onward)         Start     Ordered   06/09/20 0000   Discharge wound care:       Comments: Place a small piece of Aquacel Advantage Vanessa [name] # [phone]) to the gluteal fold, cover with sacral foam. Change daily or PRN soiling   06/09/20 0733          Follow-up Information    Lafonda Mosses, MD Follow up on 06/24/2020.   Specialty: Gynecologic Oncology Why: at 2:30pm at the [facility] information: [address] [place] [address] [phone]              Allergies  Allergen Reactions  . Ace Inhibitors Other (See Comments)    Never prescribed but she has had angioedema  . Angiotensin Receptor Blockers Other (See Comments)    ARB never Rxed but PMH  of angioedema in March 2021    Consultations:  Radiation oncology, heme-onc, gyne-onc  Procedures/Studies:  No results found.   Subjective: No acute issues or events overnight denies nausea, vomiting, diarrhea, constipation, headache, chills.   Discharge Exam: Vitals:   06/08/20 2006 06/09/20 0359  BP: 137/82 (!) 151/66  Pulse: 70 66  Resp: 16 16  Temp: 98.2 F (36.8 C) 98 F (36.7 C)  SpO2: 97% 98%   Vitals:   06/08/20 0538 06/08/20 1401 06/08/20 2006 06/09/20 0359  BP: (!) 153/68 138/62 137/82 (!) 151/66  Pulse: 70 70 70 66  Resp: 17 15 16 16   Temp: 98.1 F (36.7 C) 98.2 F (36.8 C) 98.2 F (36.8 C) 98 F (36.7 C)  TempSrc: Oral Oral Oral Oral  SpO2: 98% 100% 97% 98%  Weight:      Height:        General: Pt is alert, awake, not in acute distress Cardiovascular: RRR, S1/S2 +, no rubs, no gallops Respiratory: CTA bilaterally, no wheezing, no rhonchi Abdominal: Soft, NT, ND, bowel sounds + Extremities: no edema, no cyanosis    The results of significant diagnostics from this hospitalization (including imaging, microbiology, ancillary and laboratory) are listed below for reference.     Microbiology: No results found for this or any previous visit (from the past 240 hour(s)).   Labs: BNP (last 3 results) Recent Labs     11/08/19 0500 11/09/19 0430 02/14/20 2012  BNP 573.1* 1,043.9* 0,814.4*   Basic Metabolic Panel: Recent Labs  Lab 06/02/20 0940 06/03/20 0656 06/08/20 0643  NA 140 140 140  K 3.5 3.3* 2.8*  CL 95* 95* 93*  CO2 34* 38* 36*  GLUCOSE 169* 137* 126*  BUN 52* 50* 68*  CREATININE 2.03* 2.45* 1.96*  CALCIUM 8.4* 8.5* 8.8*  MG 1.9 1.9  --   PHOS 4.1 3.7  --    Liver Function Tests: Recent Labs  Lab 06/02/20 0940 06/03/20 0656 06/08/20 0643  AST 17 15 17   ALT 11 9 11   ALKPHOS 79 83 84  BILITOT 0.6 0.6 0.8  PROT 6.5 6.3* 7.0  ALBUMIN 2.7* 2.7* 3.1*   No results for input(s): LIPASE, AMYLASE in the last 168 hours. No results for input(s): AMMONIA in the last 168 hours. CBC: Recent Labs  Lab 06/02/20 0940 06/03/20 0656 06/06/20 0544 06/08/20 0643  WBC 3.8* 3.4* 2.8* 3.3*  NEUTROABS 2.9 2.6  --   --   HGB 8.4* 7.9* 8.1* 8.3*  HCT 27.2* 25.8* 25.6* 26.6*  MCV 96.8 96.6 95.9 95.7  PLT 186 178 168 158   Cardiac Enzymes: No results for input(s): CKTOTAL, CKMB, CKMBINDEX, TROPONINI in the last 168 hours. BNP: Invalid input(s): POCBNP CBG: Recent Labs  Lab 06/07/20 1641 06/07/20 2132 06/08/20 0805 06/08/20 1217 06/08/20 1616  GLUCAP 135* 169* 113* 204* 130*   D-Dimer No results for input(s): DDIMER in the last 72 hours. Hgb A1c No results for input(s): HGBA1C in the last 72 hours. Lipid Profile No results for input(s): CHOL, HDL, LDLCALC, TRIG, CHOLHDL, LDLDIRECT in the last 72 hours. Thyroid function studies No results for input(s): TSH, T4TOTAL, T3FREE, THYROIDAB in the last 72 hours.  Invalid input(s): FREET3 Anemia work up No results for input(s): VITAMINB12, FOLATE, FERRITIN, TIBC, IRON, RETICCTPCT in the last 72 hours. Urinalysis    Component Value Date/Time   COLORURINE AMBER (A) 04/29/2020 1648   APPEARANCEUR HAZY (A) 04/29/2020 1648   LABSPEC 1.017 04/29/2020 1648   PHURINE 5.0 04/29/2020 1648   GLUCOSEU NEGATIVE 04/29/2020 1648   HGBUR  LARGE (A) 04/29/2020 1648  BILIRUBINUR NEGATIVE 04/29/2020 [place] 04/29/2020 1648   PROTEINUR 100 (A) 04/29/2020 1648   UROBILINOGEN 0.2 03/06/2014 1644   NITRITE NEGATIVE 04/29/2020 1648   LEUKOCYTESUR TRACE (A) 04/29/2020 1648   Sepsis Labs Invalid input(s): PROCALCITONIN,  WBC,  LACTICIDVEN Microbiology No results found for this or any previous visit (from the past 240 hour(s)).   Time coordinating discharge: Over 30 minutes  SIGNED:   Little Ishikawa, DO [facility] 06/09/2020, 7:33 AM Pager   If 7PM-7AM, please contact night-coverage [email or web address]

## 2020-06-09 NOTE — TOC Transition Note (Signed)
Transition of Care [facility]) - CM/SW Discharge Note   Patient Details  Name: Vanessa [name] MRN: [id number] Date of Birth: [birth year]  [facility]) CM/SW Contact:  Lynnell Catalan, RN Phone Number: 06/09/2020, 3:47 PM   Clinical Narrative:    Pt to dc today. Pt unsure where she will go and hasn't been able to get in touch with any of her family. Her husband finally answered the phone after multiple calls and messages. He states that he is not living in their apartment and that he is cleaning it out and she can't come back there. Her son and daughter were called to help assist with finding her a place to go.   Pt to go to [employer] and [employer]. She will be provided with our hospital transportation services. Clothes were provided to the pt from our hospital clothes bank.    Final next level of care: Home/Self Care Barriers to Discharge: No Barriers Identified   Patient Goals and CMS Choice Patient states their goals for this hospitalization and ongoing recovery are:: Go to a snf CMS Medicare.gov Compare Post Acute Care list provided to:: Patient Choice offered to / list presented to : Patient   Readmission Risk Interventions Readmission Risk Prevention Plan 11/08/2019 10/21/2019 09/25/2019  Transportation Screening Complete Complete Complete  PCP or Specialist Appt within 3-5 Days - - Not Complete  Not Complete comments - - plan for SNF  [place]RI or [place] - - Complete  Social Work Consult for [place] Planning/Counseling - - Complete  Palliative Care Screening - - Not Applicable  Medication Review Press photographer) Complete Referral to Pharmacy Referral to Pharmacy  PCP or Specialist appointment within 3-5 days of discharge Complete Complete -  [place] or Home Care Consult Complete (No Data) -  SW Recovery Care/Counseling Consult Complete Complete -  Palliative Care Screening Not Applicable Not Applicable -  Skilled Nursing Facility Complete Complete -  Some  recent data might be hidden

## 2020-06-10 ENCOUNTER — Ambulatory Visit: Payer: Medicare Other

## 2020-06-10 ENCOUNTER — Encounter (HOSPITAL_COMMUNITY): Payer: Self-pay

## 2020-06-10 ENCOUNTER — Other Ambulatory Visit: Payer: Self-pay

## 2020-06-10 ENCOUNTER — Emergency Department (HOSPITAL_COMMUNITY)
Admission: EM | Admit: 2020-06-10 | Discharge: 2020-06-10 | Disposition: A | Discharge status: Home / Self Care | Payer: Medicare Other | Attending: Emergency Medicine | Admitting: Emergency Medicine

## 2020-06-10 DIAGNOSIS — R197 Diarrhea, unspecified: Secondary | ICD-10-CM | POA: Diagnosis not present

## 2020-06-10 DIAGNOSIS — Z8616 Personal history of COVID-19: Secondary | ICD-10-CM | POA: Insufficient documentation

## 2020-06-10 DIAGNOSIS — L97409 Non-pressure chronic ulcer of unspecified heel and midfoot with unspecified severity: Secondary | ICD-10-CM | POA: Diagnosis not present

## 2020-06-10 DIAGNOSIS — Z87891 Personal history of nicotine dependence: Secondary | ICD-10-CM | POA: Diagnosis not present

## 2020-06-10 DIAGNOSIS — N1831 Chronic kidney disease, stage 3a: Secondary | ICD-10-CM | POA: Diagnosis not present

## 2020-06-10 DIAGNOSIS — Z7982 Long term (current) use of aspirin: Secondary | ICD-10-CM | POA: Diagnosis not present

## 2020-06-10 DIAGNOSIS — Z79899 Other long term (current) drug therapy: Secondary | ICD-10-CM | POA: Diagnosis not present

## 2020-06-10 DIAGNOSIS — I129 Hypertensive chronic kidney disease with stage 1 through stage 4 chronic kidney disease, or unspecified chronic kidney disease: Secondary | ICD-10-CM | POA: Diagnosis not present

## 2020-06-10 DIAGNOSIS — C541 Malignant neoplasm of endometrium: Secondary | ICD-10-CM | POA: Insufficient documentation

## 2020-06-10 DIAGNOSIS — E11621 Type 2 diabetes mellitus with foot ulcer: Secondary | ICD-10-CM | POA: Insufficient documentation

## 2020-06-10 DIAGNOSIS — E1122 Type 2 diabetes mellitus with diabetic chronic kidney disease: Secondary | ICD-10-CM | POA: Diagnosis not present

## 2020-06-10 LAB — CBC WITH DIFFERENTIAL/PLATELET
Abs Immature Granulocytes: 0.01 10*3/uL (ref 0.00–0.07)
Basophils Absolute: 0 10*3/uL (ref 0.0–0.1)
Basophils Relative: 1 %
Eosinophils Absolute: 0.2 10*3/uL (ref 0.0–0.5)
Eosinophils Relative: 6 %
HCT: 25.3 % — ABNORMAL LOW (ref 36.0–46.0)
Hemoglobin: 8 g/dL — ABNORMAL LOW (ref 12.0–15.0)
Immature Granulocytes: 0 %
Lymphocytes Relative: 9 %
Lymphs Abs: 0.3 10*3/uL — ABNORMAL LOW (ref 0.7–4.0)
MCH: 30.3 pg (ref 26.0–34.0)
MCHC: 31.6 g/dL (ref 30.0–36.0)
MCV: 95.8 fL (ref 80.0–100.0)
Monocytes Absolute: 0.6 10*3/uL (ref 0.1–1.0)
Monocytes Relative: 16 %
Neutro Abs: 2.4 10*3/uL (ref 1.7–7.7)
Neutrophils Relative %: 68 %
Platelets: 154 10*3/uL (ref 150–400)
RBC: 2.64 MIL/uL — ABNORMAL LOW (ref 3.87–5.11)
RDW: 15.4 % (ref 11.5–15.5)
WBC: 3.5 10*3/uL — ABNORMAL LOW (ref 4.0–10.5)
nRBC: 0 % (ref 0.0–0.2)

## 2020-06-10 LAB — COMPREHENSIVE METABOLIC PANEL
ALT: 14 U/L (ref 0–44)
AST: 18 U/L (ref 15–41)
Albumin: 3.1 g/dL — ABNORMAL LOW (ref 3.5–5.0)
Alkaline Phosphatase: 87 U/L (ref 38–126)
Anion gap: 12 (ref 5–15)
BUN: 69 mg/dL — ABNORMAL HIGH (ref 8–23)
CO2: 34 mmol/L — ABNORMAL HIGH (ref 22–32)
Calcium: 8.6 mg/dL — ABNORMAL LOW (ref 8.9–10.3)
Chloride: 94 mmol/L — ABNORMAL LOW (ref 98–111)
Creatinine, Ser: 2.48 mg/dL — ABNORMAL HIGH (ref 0.44–1.00)
GFR, Estimated: 21 mL/min — ABNORMAL LOW (ref >60)
Glucose, Bld: 131 mg/dL — ABNORMAL HIGH (ref 70–99)
Potassium: 3.1 mmol/L — ABNORMAL LOW (ref 3.5–5.1)
Sodium: 140 mmol/L (ref 135–145)
Total Bilirubin: 0.6 mg/dL (ref 0.3–1.2)
Total Protein: 7.5 g/dL (ref 6.5–8.1)

## 2020-06-10 MED ORDER — POTASSIUM CHLORIDE CRYS ER 20 MEQ PO TBCR
20.0000 meq | EXTENDED_RELEASE_TABLET | Freq: Once | ORAL | Status: AC
  Administered 2020-06-10: 20 meq via ORAL
  Filled 2020-06-10: qty 1

## 2020-06-10 MED ORDER — LOPERAMIDE HCL 2 MG PO CAPS
2.0000 mg | ORAL_CAPSULE | Freq: Once | ORAL | Status: AC
  Administered 2020-06-10: 2 mg via ORAL
  Filled 2020-06-10: qty 1

## 2020-06-10 NOTE — ED Notes (Signed)
Calling [facility] to transport patient home

## 2020-06-10 NOTE — ED Provider Notes (Signed)
[facility] DEPT Provider Note   CSN: [id number] Arrival date & time: 06/10/20  1054     History Chief Complaint  Patient presents with  . Diarrhea    Vanessa [name] is a 64 y.o. female with past medical history of CKD, diabetes, endometrial cancer, hypertension, presenting to the emergency department via EMS for diarrhea.  Patient was just discharged from the hospital yesterday after admission for lower extremity cellulitis and vaginal bleeding.  Vaginal bleeding was treated with radiation therapy, as attributed to her endometrial cancer, as well as blood transfusion.  No vaginal bleeding currently. Patient states she had constipation during her admission and was given laxative to help that however this caused diarrhea which she had yesterday while in the hospital.  Her diarrhea improved some.  She states they told her is likely due to the laxative.  However when she arrived at the hotel she is staying at yesterday evening she began having diarrhea throughout the evening.  She is beginning to feel weak from it.  No vomiting, fever, abdominal pain, or other new symptoms.  She states she had no one else to call therefore she called 911 for medications for her diarrhea. They transported her here.  Per chart review, patient used to be living at a SNF however after this admission insurance did not approve of her returning and her husband no longer had a place for her to stay.  Her daughter arranged a hotel room which is where she stayed last night alone.  She states she is planning to move in with her daughter today, however her daughter does not drive therefore she did not call her in regards to her diarrhea.  The history is provided by the patient and medical records.       Past Medical History:  Diagnosis Date  . Anemia 10/09/2019  . Asthma   . Cellulitis and abscess of lower extremity 04/22/2020  . CKD (chronic kidney disease) stage 3, GFR 30-59 ml/min (HCC)    . Dental disease   . Diabetes mellitus without complication ([place])   . Endometrial cancer ([place])   . Esophageal dysmotility   . Heart murmur   . HLD (hyperlipidemia)   . Hypertension   . MSSA bacteremia 09/2019  . Obesity, Class III, BMI 40-49.9 (morbid obesity) ([place])   . Pressure ulcer   . Vaginal bleeding 05/2020    Patient Active Problem List   Diagnosis Date Noted  . Abnormal uterine bleeding due to primary malignant neoplasm of endometrium ([place]) 05/24/2020  . Cellulitis 04/23/2020  . Bilateral lower leg cellulitis 04/22/2020  . Neurocognitive deficits 12/11/2019  . Edema 12/04/2019  . AKI (acute kidney injury) ([place]) 12/03/2019  . Hypothermia 11/26/2019  . CKD (chronic kidney disease) stage 3, GFR 30-59 ml/min (HCC)   . Hypernatremia   . COVID-19 virus infection   . Generalized weakness   . Hypertensive urgency   . Allergic angioedema 10/19/2019  . Anasarca 10/18/2019  . Angioedema 10/18/2019  . Hypertensive emergency 10/18/2019  . Trichimoniasis 10/17/2019  . Macrocytic anemia 10/05/2019  . Esophageal dysmotility 10/04/2019  . Pressure injury of skin 09/22/2019  . Endometrial cancer ([place]) 09/21/2019  . Left humeral fracture 09/21/2019  . Iron deficiency anemia due to chronic blood loss 09/21/2019  . MSSA bacteremia 09/18/2019  . Postmenopausal bleeding 09/15/2019  . Thrombocytopenia ([place]) 09/15/2019  . Generalized pruritus 09/15/2019  . Benign hypertension with CKD (chronic kidney disease) stage III ([place]) 09/15/2019  . Hyperuricemia 09/15/2019  .  Hypokalemia 09/15/2019  . Symptomatic anemia 09/14/2019  . Non compliance w medication regimen 03/24/2015  . Dyslipidemia 03/24/2015  . DM type 2 (diabetes mellitus, type 2) ([place]) 05/23/2014  . HTN (hypertension) 05/23/2014    Past Surgical History:  Procedure Laterality Date  . BIOPSY  10/08/2019   Procedure: BIOPSY;  Surgeon: Clarene Essex, MD;  Location: [facility] ENDOSCOPY;  Service: Endoscopy;;  .  ESOPHAGOGASTRODUODENOSCOPY N/A 10/08/2019   Procedure: ESOPHAGOGASTRODUODENOSCOPY (EGD);  Surgeon: Clarene Essex, MD;  Location: [place];  Service: Endoscopy;  Laterality: N/A;  . TYMPANOSTOMY TUBE PLACEMENT Bilateral      OB History    Gravida  5   Para  5   Term  4   Preterm  1   AB      Living  5     SAB      TAB      Ectopic      Multiple      Live Births  5           Family History  Problem Relation Age of Onset  . Stroke Mother   . Diabetes Mother   . Hypertension Mother   . Cancer Mother        possible ovarian  . Diabetes Brother   . Hypertension Brother   . Cancer Maternal Grandmother        possible ovarian    Social History   Tobacco Use  . Smoking status: Former Research scientist (life sciences)  . Smokeless tobacco: Never Used  . Tobacco comment: " years ago ", >6yrs  Vaping Use  . Vaping Use: Never used  Substance Use Topics  . Alcohol use: Yes    Comment: occasional  . Drug use: No    Home Medications Prior to Admission medications   Medication Sig Start Date End Date Taking? Authorizing Provider  acetaminophen (TYLENOL) 500 MG tablet Take 500 mg by mouth every 6 (six) hours as needed (pain).     [provider]  Amino Acids-Protein Hydrolys (FEEDING SUPPLEMENT, PRO-STAT SUGAR FREE 64,) LIQD Take 30 mLs by mouth 3 (three) times daily with meals. Patient not taking: Reported on 02/15/2020 11/21/19   Jonetta Osgood, MD  amLODipine (NORVASC) 10 MG tablet Take 1 tablet (10 mg total) by mouth daily. 04/29/20   Swayze, Ava, DO  aspirin 81 MG chewable tablet Chew 81 mg by mouth daily.    [provider]  carvedilol (COREG) 6.25 MG tablet Take 1 tablet (6.25 mg total) by mouth 2 (two) times daily with a meal. 12/13/19   Medina-Vargas, Monina C, NP  cloNIDine (CATAPRES) 0.2 MG tablet Take 1 tablet (0.2 mg total) by mouth 2 (two) times daily. Patient taking differently: Take 0.2 mg by mouth 2 (two) times daily with a meal.  12/13/19   Medina-Vargas,  Monina C, NP  diphenhydrAMINE (BENADRYL) 25 MG tablet Take 25 mg by mouth See admin instructions. Take one tablet (25 mg) by mouth three times daily, may also take one tablet (25 mg) every 8 hours as needed for itching for 14 days - 05/15/2020 thru 05/29/2020    [provider]  ferrous sulfate 325 (65 FE) MG tablet Take 1 tablet (325 mg total) by mouth 2 (two) times daily with a meal. 12/13/19   Medina-Vargas, Monina C, NP  furosemide (LASIX) 20 MG tablet Take 60 mg by mouth 2 (two) times daily. 9am, 6pm    [provider]  hydrALAZINE (APRESOLINE) 100 MG tablet Take 1 tablet (100  mg total) by mouth every 8 (eight) hours. 04/30/20   Antonieta Pert, MD  hydrocerin (EUCERIN) CREA Apply 1 application topically daily. 04/29/20   Swayze, Ava, DO  hydrocortisone (ANUSOL-HC) 25 MG suppository Place 25 mg rectally daily.    [provider]  hydrocortisone (PROCTOZONE-HC) 2.5 % rectal cream Place 1 application rectally every 12 (twelve) hours as needed for hemorrhoids.     [provider]  loratadine (CLARITIN) 10 MG tablet Take 1 tablet (10 mg total) by mouth daily as needed for allergies. 12/13/19   Medina-Vargas, Monina C, NP  metolazone (ZAROXOLYN) 5 MG tablet Take 5 mg by mouth daily.    [provider]  Multiple Vitamin (MULTIVITAMIN WITH MINERALS) TABS tablet Take 1 tablet by mouth daily. 04/29/20   Swayze, Ava, DO  nutrition supplement, JUVEN, (JUVEN) PACK Take 1 packet by mouth 2 (two) times daily between meals. 04/29/20   Swayze, Ava, DO  polyethylene glycol (MIRALAX / GLYCOLAX) 17 g packet Take 17 g by mouth daily. 09/25/19   Arrien, Jimmy Picket, MD  sodium bicarbonate 650 MG tablet Take 2 tablets (1,300 mg total) by mouth 2 (two) times daily. Patient taking differently: Take 1,300 mg by mouth 2 (two) times daily with a meal.  04/30/20   Antonieta Pert, MD    Allergies    Ace inhibitors and Angiotensin receptor blockers  Review of Systems   Review of Systems   Gastrointestinal: Positive for diarrhea.  Neurological: Positive for weakness (generalized).  All other systems reviewed and are negative.   Physical Exam Updated Vital Signs BP (!) 168/82   Pulse 69   Temp 98.5 F (36.9 C)   Resp 20   Ht 4\' 11"  (1.499 m)   Wt 117.9 kg   LMP 03/17/2015   SpO2 100%   BMI 52.51 kg/m   Physical Exam Vitals and nursing note reviewed.  Constitutional:      General: She is not in acute distress.    Appearance: She is well-developed.     Comments: Chronically ill-appearing  HENT:     Head: Normocephalic and atraumatic.  Eyes:     Conjunctiva/sclera: Conjunctivae normal.  Cardiovascular:     Rate and Rhythm: Normal rate and regular rhythm.  Pulmonary:     Effort: Pulmonary effort is normal.     Breath sounds: Normal breath sounds.  Abdominal:     General: Bowel sounds are normal. There is no distension.     Palpations: Abdomen is soft.     Tenderness: There is no abdominal tenderness.  Skin:    General: Skin is warm.  Neurological:     Mental Status: She is alert.  Psychiatric:        Behavior: Behavior normal.     ED Results / Procedures / Treatments   Labs (all labs ordered are listed, but only abnormal results are displayed) Labs Reviewed  COMPREHENSIVE METABOLIC PANEL - Abnormal; Notable for the following components:      Result Value   Potassium 3.1 (*)    Chloride 94 (*)    CO2 34 (*)    Glucose, Bld 131 (*)    BUN 69 (*)    Creatinine, Ser 2.48 (*)    Calcium 8.6 (*)    Albumin 3.1 (*)    GFR, Estimated 21 (*)    All other components within normal limits  CBC WITH DIFFERENTIAL/PLATELET - Abnormal; Notable for the following components:   WBC 3.5 (*)    RBC 2.64 (*)  Hemoglobin 8.0 (*)    HCT 25.3 (*)    Lymphs Abs 0.3 (*)    All other components within normal limits    EKG None  Radiology No results found.  Procedures Procedures (including critical care time)  Medications Ordered in ED Medications   potassium chloride SA (KLOR-CON) CR tablet 20 mEq (has no administration in time range)  loperamide (IMODIUM) capsule 2 mg (has no administration in time range)    ED Course  I have reviewed the triage vital signs and the nursing notes.  Pertinent labs & imaging results that were available during my care of the patient were reviewed by me and considered in my medical decision making (see chart for details).  Clinical Course as of Jun 10 1254  Wed Jun 10, 2020  1140 Overall stable since prior  Hemoglobin(!): 8.0 [JR]    Clinical Course User Index [JR] Lorali Khamis, [place] N, PA-C   MDM Rules/Calculators/A&P                          Patient presented to the ED for diarrhea after discharge from hospital yesterday, as described in above HPI.  She states she was treated for constipation during her admission with a laxative, however it did "too well of a job" and caused her to have diarrhea.  She had diarrhea yesterday while in the hospital however it slowed and she was discharged.  Yesterday evening diarrhea returned and is making her feel generally weak.  She states she did not know who else to call for medications to help her diarrhea therefore she called EMS.  No abdominal pain or fevers.  No vaginal bleeding currently.  On exam she is in no acute distress, abdomen is soft and nontender.  Vital signs are stable.  Labs are reassuring, white count and hemoglobin are stable, potassium is improved since yesterday, creatinine is at baseline.  Patient discussed with by Dr. Zenia Resides.  Plan to give small dose of p.o. potassium replacement here considering diarrhea, however also consider CKD.  Also discussed antidiarrheals however do not want to cause constipation once more. Will give on dose here, otherwise encouraged PO hydration and close PCP follow up. Return if worsening.  Discussed results, findings, treatment and follow up. Patient advised of return precautions. Patient verbalized understanding and  agreed with plan.  Final Clinical Impression(s) / ED Diagnoses Final diagnoses:  Diarrhea, unspecified type    Rx / DC Orders ED Discharge Orders    None       Ashby Moskal, [place] N, PA-C 06/10/20 1255    Lacretia Leigh, MD 06/15/20 1333

## 2020-06-10 NOTE — ED Triage Notes (Signed)
patient arrived via ems from motel with complaint of diarrhea x3 since 2300 and weakness. Patient discharged from hospital yesterday and fell last night when trying to get to the room and tripped on the curb

## 2020-06-10 NOTE — ED Notes (Signed)
Will consult social work for discharge due to patient has no place to go at discharge and no ride
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
## 2020-06-10 NOTE — Discharge Instructions (Signed)
It is important you stay hydrated.  At this time we do not recommend taking regular Imodium as this will likely cause constipation once more.  The laxative should not have lasting effects, your bowels are likely to return to normal. Follow with your primary care provider.

## 2020-06-24 ENCOUNTER — Inpatient Hospital Stay: Payer: Self-pay | Attending: Gynecologic Oncology | Admitting: Gynecologic Oncology

## 2020-07-01 ENCOUNTER — Telehealth: Payer: Self-pay | Admitting: Oncology

## 2020-07-01 NOTE — Telephone Encounter (Signed)
Left a message regarding missed appointment with Dr. Berline Lopes.  Requested a return call to reschedule.

## 2020-07-09 ENCOUNTER — Ambulatory Visit
Admission: RE | Admit: 2020-07-09 | Discharge: 2020-07-09 | Disposition: A | Discharge status: Home / Self Care | Payer: Medicare Other | Source: Ambulatory Visit | Attending: Radiation Oncology | Admitting: Radiation Oncology

## 2020-07-09 NOTE — Progress Notes (Incomplete)
  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 She began her treatments as an inpatient and was then transferred to a skilled nursing facility. She reported feeling better as treatment continued. She also reported improvement in pelvic pain as well as vaginal bleeding.   Plan: The patient will follow-up with radiation oncology in one month.  ________________________________________________   Blair Promise, PhD, MD  This document serves as a record of services personally performed by Gery Pray, MD. It was created on his behalf by Clerance Lav, a trained medical scribe. The creation of this record is based on the scribe's personal observations and the provider's statements to them. This document has been checked and approved by the attending provider.

## 2020-07-14 ENCOUNTER — Telehealth: Payer: Self-pay | Admitting: Radiation Oncology

## 2020-07-14 NOTE — Telephone Encounter (Signed)
Called patient to reschedule her 07/09/20 follow up with Dr. Sondra Come, no answer, LVM.

## 2020-08-07 ENCOUNTER — Emergency Department (HOSPITAL_COMMUNITY): Payer: Medicare Other

## 2020-08-07 ENCOUNTER — Inpatient Hospital Stay (HOSPITAL_COMMUNITY)
Admission: EM | Admit: 2020-08-07 | Discharge: 2020-08-18 | DRG: 291 | Disposition: A | Discharge status: Skilled Nursing Facility | Payer: Medicare Other | Attending: Internal Medicine | Admitting: Internal Medicine

## 2020-08-07 ENCOUNTER — Other Ambulatory Visit: Payer: Self-pay

## 2020-08-07 DIAGNOSIS — E1122 Type 2 diabetes mellitus with diabetic chronic kidney disease: Secondary | ICD-10-CM | POA: Diagnosis present

## 2020-08-07 DIAGNOSIS — I248 Other forms of acute ischemic heart disease: Secondary | ICD-10-CM | POA: Diagnosis present

## 2020-08-07 DIAGNOSIS — I5031 Acute diastolic (congestive) heart failure: Secondary | ICD-10-CM

## 2020-08-07 DIAGNOSIS — Z823 Family history of stroke: Secondary | ICD-10-CM

## 2020-08-07 DIAGNOSIS — N1832 Chronic kidney disease, stage 3b: Secondary | ICD-10-CM | POA: Diagnosis not present

## 2020-08-07 DIAGNOSIS — E785 Hyperlipidemia, unspecified: Secondary | ICD-10-CM | POA: Diagnosis present

## 2020-08-07 DIAGNOSIS — E876 Hypokalemia: Secondary | ICD-10-CM | POA: Diagnosis present

## 2020-08-07 DIAGNOSIS — I5043 Acute on chronic combined systolic (congestive) and diastolic (congestive) heart failure: Secondary | ICD-10-CM | POA: Diagnosis not present

## 2020-08-07 DIAGNOSIS — L89152 Pressure ulcer of sacral region, stage 2: Secondary | ICD-10-CM | POA: Diagnosis present

## 2020-08-07 DIAGNOSIS — T502X5A Adverse effect of carbonic-anhydrase inhibitors, benzothiadiazides and other diuretics, initial encounter: Secondary | ICD-10-CM | POA: Diagnosis not present

## 2020-08-07 DIAGNOSIS — N183 Chronic kidney disease, stage 3 unspecified: Secondary | ICD-10-CM | POA: Diagnosis present

## 2020-08-07 DIAGNOSIS — I10 Essential (primary) hypertension: Secondary | ICD-10-CM | POA: Diagnosis present

## 2020-08-07 DIAGNOSIS — N184 Chronic kidney disease, stage 4 (severe): Secondary | ICD-10-CM | POA: Diagnosis not present

## 2020-08-07 DIAGNOSIS — Z87891 Personal history of nicotine dependence: Secondary | ICD-10-CM | POA: Diagnosis not present

## 2020-08-07 DIAGNOSIS — Y92009 Unspecified place in unspecified non-institutional (private) residence as the place of occurrence of the external cause: Secondary | ICD-10-CM | POA: Diagnosis not present

## 2020-08-07 DIAGNOSIS — D649 Anemia, unspecified: Secondary | ICD-10-CM

## 2020-08-07 DIAGNOSIS — E46 Unspecified protein-calorie malnutrition: Secondary | ICD-10-CM | POA: Diagnosis present

## 2020-08-07 DIAGNOSIS — I4892 Unspecified atrial flutter: Secondary | ICD-10-CM | POA: Diagnosis present

## 2020-08-07 DIAGNOSIS — I129 Hypertensive chronic kidney disease with stage 1 through stage 4 chronic kidney disease, or unspecified chronic kidney disease: Secondary | ICD-10-CM | POA: Diagnosis not present

## 2020-08-07 DIAGNOSIS — Z91128 Patient's intentional underdosing of medication regimen for other reason: Secondary | ICD-10-CM

## 2020-08-07 DIAGNOSIS — R7989 Other specified abnormal findings of blood chemistry: Secondary | ICD-10-CM

## 2020-08-07 DIAGNOSIS — I482 Chronic atrial fibrillation, unspecified: Secondary | ICD-10-CM | POA: Diagnosis present

## 2020-08-07 DIAGNOSIS — I083 Combined rheumatic disorders of mitral, aortic and tricuspid valves: Secondary | ICD-10-CM | POA: Diagnosis present

## 2020-08-07 DIAGNOSIS — D631 Anemia in chronic kidney disease: Secondary | ICD-10-CM | POA: Diagnosis present

## 2020-08-07 DIAGNOSIS — Y92239 Unspecified place in hospital as the place of occurrence of the external cause: Secondary | ICD-10-CM | POA: Diagnosis not present

## 2020-08-07 DIAGNOSIS — Z20822 Contact with and (suspected) exposure to covid-19: Secondary | ICD-10-CM | POA: Diagnosis present

## 2020-08-07 DIAGNOSIS — E119 Type 2 diabetes mellitus without complications: Secondary | ICD-10-CM

## 2020-08-07 DIAGNOSIS — Z8616 Personal history of COVID-19: Secondary | ICD-10-CM | POA: Diagnosis not present

## 2020-08-07 DIAGNOSIS — R778 Other specified abnormalities of plasma proteins: Secondary | ICD-10-CM | POA: Diagnosis not present

## 2020-08-07 DIAGNOSIS — J9601 Acute respiratory failure with hypoxia: Secondary | ICD-10-CM | POA: Diagnosis not present

## 2020-08-07 DIAGNOSIS — E8809 Other disorders of plasma-protein metabolism, not elsewhere classified: Secondary | ICD-10-CM | POA: Diagnosis present

## 2020-08-07 DIAGNOSIS — R34 Anuria and oliguria: Secondary | ICD-10-CM | POA: Diagnosis present

## 2020-08-07 DIAGNOSIS — I130 Hypertensive heart and chronic kidney disease with heart failure and stage 1 through stage 4 chronic kidney disease, or unspecified chronic kidney disease: Principal | ICD-10-CM | POA: Diagnosis present

## 2020-08-07 DIAGNOSIS — J45909 Unspecified asthma, uncomplicated: Secondary | ICD-10-CM | POA: Diagnosis present

## 2020-08-07 DIAGNOSIS — I5082 Biventricular heart failure: Secondary | ICD-10-CM | POA: Diagnosis present

## 2020-08-07 DIAGNOSIS — L299 Pruritus, unspecified: Secondary | ICD-10-CM | POA: Diagnosis not present

## 2020-08-07 DIAGNOSIS — Z8249 Family history of ischemic heart disease and other diseases of the circulatory system: Secondary | ICD-10-CM

## 2020-08-07 DIAGNOSIS — N179 Acute kidney failure, unspecified: Secondary | ICD-10-CM | POA: Diagnosis present

## 2020-08-07 DIAGNOSIS — Z888 Allergy status to other drugs, medicaments and biological substances status: Secondary | ICD-10-CM

## 2020-08-07 DIAGNOSIS — C541 Malignant neoplasm of endometrium: Secondary | ICD-10-CM | POA: Diagnosis present

## 2020-08-07 DIAGNOSIS — Z79899 Other long term (current) drug therapy: Secondary | ICD-10-CM

## 2020-08-07 DIAGNOSIS — I4891 Unspecified atrial fibrillation: Secondary | ICD-10-CM

## 2020-08-07 DIAGNOSIS — I5033 Acute on chronic diastolic (congestive) heart failure: Secondary | ICD-10-CM | POA: Diagnosis not present

## 2020-08-07 DIAGNOSIS — I483 Typical atrial flutter: Secondary | ICD-10-CM | POA: Diagnosis not present

## 2020-08-07 DIAGNOSIS — Z515 Encounter for palliative care: Secondary | ICD-10-CM

## 2020-08-07 DIAGNOSIS — Z833 Family history of diabetes mellitus: Secondary | ICD-10-CM

## 2020-08-07 DIAGNOSIS — Z7189 Other specified counseling: Secondary | ICD-10-CM | POA: Diagnosis not present

## 2020-08-07 DIAGNOSIS — N171 Acute kidney failure with acute cortical necrosis: Secondary | ICD-10-CM | POA: Diagnosis not present

## 2020-08-07 DIAGNOSIS — Z23 Encounter for immunization: Secondary | ICD-10-CM | POA: Diagnosis present

## 2020-08-07 DIAGNOSIS — Z7982 Long term (current) use of aspirin: Secondary | ICD-10-CM

## 2020-08-07 DIAGNOSIS — Z6841 Body Mass Index (BMI) 40.0 and over, adult: Secondary | ICD-10-CM

## 2020-08-07 DIAGNOSIS — I509 Heart failure, unspecified: Secondary | ICD-10-CM

## 2020-08-07 DIAGNOSIS — E873 Alkalosis: Secondary | ICD-10-CM | POA: Diagnosis not present

## 2020-08-07 DIAGNOSIS — R5381 Other malaise: Secondary | ICD-10-CM | POA: Diagnosis present

## 2020-08-07 DIAGNOSIS — T447X6A Underdosing of beta-adrenoreceptor antagonists, initial encounter: Secondary | ICD-10-CM | POA: Diagnosis present

## 2020-08-07 LAB — CBC WITH DIFFERENTIAL/PLATELET
Abs Immature Granulocytes: 0 10*3/uL (ref 0.00–0.07)
Basophils Absolute: 0.1 10*3/uL (ref 0.0–0.1)
Basophils Relative: 1 %
Eosinophils Absolute: 0 10*3/uL (ref 0.0–0.5)
Eosinophils Relative: 0 %
HCT: 29.7 % — ABNORMAL LOW (ref 36.0–46.0)
Hemoglobin: 8.8 g/dL — ABNORMAL LOW (ref 12.0–15.0)
Lymphocytes Relative: 11 %
Lymphs Abs: 0.7 10*3/uL (ref 0.7–4.0)
MCH: 29.2 pg (ref 26.0–34.0)
MCHC: 29.6 g/dL — ABNORMAL LOW (ref 30.0–36.0)
MCV: 98.7 fL (ref 80.0–100.0)
Monocytes Absolute: 0.1 10*3/uL (ref 0.1–1.0)
Monocytes Relative: 1 %
Neutro Abs: 5.3 10*3/uL (ref 1.7–7.7)
Neutrophils Relative %: 87 %
Platelets: 251 10*3/uL (ref 150–400)
RBC: 3.01 MIL/uL — ABNORMAL LOW (ref 3.87–5.11)
RDW: 17.7 % — ABNORMAL HIGH (ref 11.5–15.5)
WBC: 6.1 10*3/uL (ref 4.0–10.5)
nRBC: 3.1 % — ABNORMAL HIGH (ref 0.0–0.2)
nRBC: 4 /100 WBC — ABNORMAL HIGH

## 2020-08-07 LAB — HEPATIC FUNCTION PANEL
ALT: 60 U/L — ABNORMAL HIGH (ref 0–44)
AST: 48 U/L — ABNORMAL HIGH (ref 15–41)
Albumin: 2.5 g/dL — ABNORMAL LOW (ref 3.5–5.0)
Alkaline Phosphatase: 90 U/L (ref 38–126)
Bilirubin, Direct: 0.3 mg/dL — ABNORMAL HIGH (ref 0.0–0.2)
Indirect Bilirubin: 0.6 mg/dL (ref 0.3–0.9)
Total Bilirubin: 0.9 mg/dL (ref 0.3–1.2)
Total Protein: 6.7 g/dL (ref 6.5–8.1)

## 2020-08-07 LAB — BASIC METABOLIC PANEL
Anion gap: 13 (ref 5–15)
BUN: 37 mg/dL — ABNORMAL HIGH (ref 8–23)
CO2: 27 mmol/L (ref 22–32)
Calcium: 7.7 mg/dL — ABNORMAL LOW (ref 8.9–10.3)
Chloride: 102 mmol/L (ref 98–111)
Creatinine, Ser: 3.18 mg/dL — ABNORMAL HIGH (ref 0.44–1.00)
GFR, Estimated: 16 mL/min — ABNORMAL LOW (ref >60)
Glucose, Bld: 90 mg/dL (ref 70–99)
Potassium: 2.8 mmol/L — ABNORMAL LOW (ref 3.5–5.1)
Sodium: 142 mmol/L (ref 135–145)

## 2020-08-07 LAB — TROPONIN I (HIGH SENSITIVITY)
Troponin I (High Sensitivity): 47 ng/L — ABNORMAL HIGH (ref <18)
Troponin I (High Sensitivity): 40 ng/L — ABNORMAL HIGH (ref <18)

## 2020-08-07 LAB — AMMONIA: Ammonia: 26 umol/L (ref 9–35)

## 2020-08-07 LAB — PROTIME-INR
INR: 1.3 — ABNORMAL HIGH (ref 0.8–1.2)
Prothrombin Time: 15.6 seconds — ABNORMAL HIGH (ref 11.4–15.2)

## 2020-08-07 LAB — BRAIN NATRIURETIC PEPTIDE: B Natriuretic Peptide: 2227 pg/mL — ABNORMAL HIGH (ref 0.0–100.0)

## 2020-08-07 LAB — MAGNESIUM: Magnesium: 2.2 mg/dL (ref 1.7–2.4)

## 2020-08-07 LAB — SARS CORONAVIRUS 2 BY RT PCR (HOSPITAL ORDER, PERFORMED IN ~~LOC~~ HOSPITAL LAB): SARS Coronavirus 2: NEGATIVE

## 2020-08-07 MED ORDER — FUROSEMIDE 10 MG/ML IJ SOLN
60.0000 mg | Freq: Once | INTRAMUSCULAR | Status: AC
  Administered 2020-08-07: 60 mg via INTRAVENOUS
  Filled 2020-08-07: qty 6

## 2020-08-07 MED ORDER — CARVEDILOL 3.125 MG PO TABS
6.2500 mg | ORAL_TABLET | Freq: Once | ORAL | Status: AC
  Administered 2020-08-07: 6.25 mg via ORAL
  Filled 2020-08-07: qty 2

## 2020-08-07 MED ORDER — POTASSIUM CHLORIDE CRYS ER 20 MEQ PO TBCR
40.0000 meq | EXTENDED_RELEASE_TABLET | Freq: Once | ORAL | Status: AC
  Administered 2020-08-08: 40 meq via ORAL
  Filled 2020-08-07: qty 2

## 2020-08-07 MED ORDER — METOPROLOL TARTRATE 5 MG/5ML IV SOLN
5.0000 mg | INTRAVENOUS | Status: DC | PRN
  Administered 2020-08-07 – 2020-08-08 (×5): 5 mg via INTRAVENOUS
  Filled 2020-08-07 (×5): qty 5

## 2020-08-07 MED ORDER — POTASSIUM CHLORIDE 10 MEQ/100ML IV SOLN
10.0000 meq | INTRAVENOUS | Status: AC
  Administered 2020-08-07 (×2): 10 meq via INTRAVENOUS
  Filled 2020-08-07 (×2): qty 100

## 2020-08-07 MED ORDER — POTASSIUM CHLORIDE CRYS ER 20 MEQ PO TBCR
40.0000 meq | EXTENDED_RELEASE_TABLET | Freq: Once | ORAL | Status: AC
  Administered 2020-08-07: 40 meq via ORAL
  Filled 2020-08-07: qty 2

## 2020-08-07 NOTE — ED Provider Notes (Signed)
[facility] EMERGENCY DEPARTMENT Provider Note   CSN: [id number] Arrival date & time: 08/07/20  1651     History Chief Complaint  Patient presents with  . Weakness    Vanessa [name] is a 65 y.o. female.  Presents to ER for multiple concerns.  She states that over the last few days she has had worsening cough, fatigue.  Dry cough, nonproductive.  Had some chills but no fever.  Patient also states that she has had increase in swelling over the past month in her legs and her abdomen.  Patient denies any abdominal pain or chest pain.  Does feel somewhat short of breath.  EMS reports patient has been out of meds for 2 months.  Patient has a history of endometrial cancer, hypertension, CKD, diabetes, recent admission for anemia, vaginal bleeding.   Per Gyn note on 11/15 -   She is not a candidate for hysterectomy, and has expelled her IUD. Given the grade 3 nature of her tumor, it is unlikely she will have much response to either a replaced IUD or oral progestins.   A treatment recommendation for radiation (definitive) had been made in August, 2021, however, the patient failed to show to 5 different appointments made with Dr Sondra Come due to transportation issues. Our goal of therapy has shifted from curative intent to palliative intent (control of bleeding). We have asked Dr Sondra Come from [facility] to see Ms Terriquez and consider initiating a palliative course of pelvic RT during this hospitalization (with goal to complete as an outpatient with transportation assistance from rehab/SNF).     HPI     Past Medical History:  Diagnosis Date  . Anemia 10/09/2019  . Asthma   . Cellulitis and abscess of lower extremity 04/22/2020  . CKD (chronic kidney disease) stage 3, GFR 30-59 ml/min (HCC)   . Dental disease   . Diabetes mellitus without complication ([place])   . Endometrial cancer ([place])   . Esophageal dysmotility   . Heart murmur   . HLD (hyperlipidemia)   . Hypertension    . MSSA bacteremia 09/2019  . Obesity, Class III, BMI 40-49.9 (morbid obesity) ([place])   . Pressure ulcer   . Vaginal bleeding 05/2020    Patient Active Problem List   Diagnosis Date Noted  . Acute exacerbation of CHF (congestive heart failure) ([place]) 08/07/2020  . Abnormal uterine bleeding due to primary malignant neoplasm of endometrium ([place]) 05/24/2020  . Cellulitis 04/23/2020  . Bilateral lower leg cellulitis 04/22/2020  . Neurocognitive deficits 12/11/2019  . Edema 12/04/2019  . AKI (acute kidney injury) ([place]) 12/03/2019  . Hypothermia 11/26/2019  . CKD (chronic kidney disease) stage 3, GFR 30-59 ml/min (HCC)   . Hypernatremia   . COVID-19 virus infection   . Generalized weakness   . Hypertensive urgency   . Allergic angioedema 10/19/2019  . Anasarca 10/18/2019  . Angioedema 10/18/2019  . Hypertensive emergency 10/18/2019  . Trichimoniasis 10/17/2019  . Macrocytic anemia 10/05/2019  . Esophageal dysmotility 10/04/2019  . Pressure injury of skin 09/22/2019  . Endometrial cancer ([place]) 09/21/2019  . Left humeral fracture 09/21/2019  . Iron deficiency anemia due to chronic blood loss 09/21/2019  . MSSA bacteremia 09/18/2019  . Postmenopausal bleeding 09/15/2019  . Thrombocytopenia ([place]) 09/15/2019  . Generalized pruritus 09/15/2019  . Benign hypertension with CKD (chronic kidney disease) stage III ([place]) 09/15/2019  . Hyperuricemia 09/15/2019  . Hypokalemia 09/15/2019  . Symptomatic anemia 09/14/2019  . Non compliance w medication regimen 03/24/2015  .  Dyslipidemia 03/24/2015  . DM type 2 (diabetes mellitus, type 2) ([place]) 05/23/2014  . HTN (hypertension) 05/23/2014    Past Surgical History:  Procedure Laterality Date  . BIOPSY  10/08/2019   Procedure: BIOPSY;  Surgeon: Clarene Essex, MD;  Location[facility] ENDOSCOPY;  Service: Endoscopy;;  . ESOPHAGOGASTRODUODENOSCOPY N/A 10/08/2019   Procedure: ESOPHAGOGASTRODUODENOSCOPY (EGD);  Surgeon: Clarene Essex, MD;  Location: [facility];  Service: Endoscopy;  Laterality: N/A;  . TYMPANOSTOMY TUBE PLACEMENT Bilateral      OB History    Gravida  5   Para  5   Term  4   Preterm  1   AB      Living  5     SAB      IAB      Ectopic      Multiple      Live Births  5           Family History  Problem Relation Age of Onset  . Stroke Mother   . Diabetes Mother   . Hypertension Mother   . Cancer Mother        possible ovarian  . Diabetes Brother   . Hypertension Brother   . Cancer Maternal Grandmother        possible ovarian    Social History   Tobacco Use  . Smoking status: Former Research scientist (life sciences)  . Smokeless tobacco: Never Used  . Tobacco comment: " years ago ", >63yrs  Vaping Use  . Vaping Use: Never used  Substance Use Topics  . Alcohol use: Yes    Comment: occasional  . Drug use: No    Home Medications Prior to Admission medications   Medication Sig Start Date End Date Taking? Authorizing Provider  acetaminophen (TYLENOL) 500 MG tablet Take 500 mg by mouth every 6 (six) hours as needed (pain).     [provider]  Amino Acids-Protein Hydrolys (FEEDING SUPPLEMENT, PRO-STAT SUGAR FREE 64,) LIQD Take 30 mLs by mouth 3 (three) times daily with meals. Patient not taking: No sig reported 11/21/19   Jonetta Osgood, MD  amLODipine (NORVASC) 10 MG tablet Take 1 tablet (10 mg total) by mouth daily. 04/29/20   Swayze, Ava, DO  aspirin 81 MG chewable tablet Chew 81 mg by mouth daily.    [provider]  carvedilol (COREG) 6.25 MG tablet Take 1 tablet (6.25 mg total) by mouth 2 (two) times daily with a meal. 12/13/19   Medina-Vargas, Monina C, NP  cloNIDine (CATAPRES) 0.2 MG tablet Take 1 tablet (0.2 mg total) by mouth 2 (two) times daily. Patient taking differently: Take 0.2 mg by mouth 2 (two) times daily with a meal. 12/13/19   Medina-Vargas, Monina C, NP  diphenhydrAMINE (BENADRYL) 25 MG tablet Take 25 mg by mouth See admin instructions. Take one tablet (25 mg) by mouth  three times daily, may also take one tablet (25 mg) every 8 hours as needed for itching for 14 days - 05/15/2020 thru 05/29/2020    [provider]  ferrous sulfate 325 (65 FE) MG tablet Take 1 tablet (325 mg total) by mouth 2 (two) times daily with a meal. 12/13/19   Medina-Vargas, Monina C, NP  furosemide (LASIX) 20 MG tablet Take 60 mg by mouth 2 (two) times daily. 9am, 6pm    [provider]  hydrALAZINE (APRESOLINE) 100 MG tablet Take 1 tablet (100 mg total) by mouth every 8 (eight) hours. 04/30/20   Antonieta Pert, MD  hydrocerin (EUCERIN) CREA  Apply 1 application topically daily. 04/29/20   Swayze, Ava, DO  hydrocortisone (ANUSOL-HC) 2.5 % rectal cream Place 1 application rectally every 12 (twelve) hours as needed for hemorrhoids.     [provider]  hydrocortisone (ANUSOL-HC) 25 MG suppository Place 25 mg rectally daily.    [provider]  loratadine (CLARITIN) 10 MG tablet Take 1 tablet (10 mg total) by mouth daily as needed for allergies. 12/13/19   Medina-Vargas, Monina C, NP  metolazone (ZAROXOLYN) 5 MG tablet Take 5 mg by mouth daily.    [provider]  Multiple Vitamin (MULTIVITAMIN WITH MINERALS) TABS tablet Take 1 tablet by mouth daily. 04/29/20   Swayze, Ava, DO  nutrition supplement, JUVEN, (JUVEN) PACK Take 1 packet by mouth 2 (two) times daily between meals. 04/29/20   Swayze, Ava, DO  polyethylene glycol (MIRALAX / GLYCOLAX) 17 g packet Take 17 g by mouth daily. 09/25/19   Arrien, Jimmy Picket, MD  sodium bicarbonate 650 MG tablet Take 2 tablets (1,300 mg total) by mouth 2 (two) times daily. Patient taking differently: Take 1,300 mg by mouth 2 (two) times daily with a meal. 04/30/20   Antonieta Pert, MD    Allergies    Ace inhibitors and Angiotensin receptor blockers  Review of Systems   Review of Systems  Constitutional: Negative for chills and fever.  HENT: Negative for ear pain and sore throat.   Eyes: Negative for pain and visual  disturbance.  Respiratory: Positive for shortness of breath. Negative for cough.   Cardiovascular: Positive for leg swelling. Negative for chest pain and palpitations.  Gastrointestinal: Positive for abdominal distention. Negative for abdominal pain and vomiting.  Genitourinary: Negative for dysuria and hematuria.  Musculoskeletal: Negative for arthralgias and back pain.  Skin: Negative for color change and rash.  Neurological: Negative for seizures and syncope.  All other systems reviewed and are negative.   Physical Exam Updated Vital Signs BP 121/75   Pulse (!) 118   Temp 98.6 F (37 C) (Oral)   Resp 17   Ht 4\' 11"  (1.499 m)   Wt 117.9 kg   LMP 03/17/2015   SpO2 100%   BMI 52.51 kg/m   Physical Exam Vitals and nursing note reviewed.  Constitutional:      General: She is not in acute distress.    Appearance: She is well-developed and well-nourished.     Comments: Chronically ill-appearing in no distress  HENT:     Head: Normocephalic and atraumatic.  Eyes:     Conjunctiva/sclera: Conjunctivae normal.  Cardiovascular:     Rate and Rhythm: Tachycardia present. Rhythm irregular.     Pulses: Normal pulses.     Heart sounds: No murmur heard.   Pulmonary:     Comments: Mild tachypnea, diminished breath sounds bilaterally at bases Abdominal:     General: There is distension.     Tenderness: There is no abdominal tenderness. There is no guarding.  Musculoskeletal:        General: No edema.     Cervical back: Neck supple.     Comments: Pitting edema in lower legs extending up to lower abdomen  Skin:    General: Skin is warm and dry.  Neurological:     General: No focal deficit present.     Mental Status: She is alert.  Psychiatric:        Mood and Affect: Mood and affect and mood normal.     ED Results / Procedures / Treatments   Labs (  all labs ordered are listed, but only abnormal results are displayed) Labs Reviewed  BRAIN NATRIURETIC PEPTIDE - Abnormal;  Notable for the following components:      Result Value   B Natriuretic Peptide 2,227.0 (*)    All other components within normal limits  CBC WITH DIFFERENTIAL/PLATELET - Abnormal; Notable for the following components:   RBC 3.01 (*)    Hemoglobin 8.8 (*)    HCT 29.7 (*)    MCHC 29.6 (*)    RDW 17.7 (*)    nRBC 3.1 (*)    nRBC 4 (*)    All other components within normal limits  BASIC METABOLIC PANEL - Abnormal; Notable for the following components:   Potassium 2.8 (*)    BUN 37 (*)    Creatinine, Ser 3.18 (*)    Calcium 7.7 (*)    GFR, Estimated 16 (*)    All other components within normal limits  HEPATIC FUNCTION PANEL - Abnormal; Notable for the following components:   Albumin 2.5 (*)    AST 48 (*)    ALT 60 (*)    Bilirubin, Direct 0.3 (*)    All other components within normal limits  PROTIME-INR - Abnormal; Notable for the following components:   Prothrombin Time 15.6 (*)    INR 1.3 (*)    All other components within normal limits  TROPONIN I (HIGH SENSITIVITY) - Abnormal; Notable for the following components:   Troponin I (High Sensitivity) 47 (*)    All other components within normal limits  SARS CORONAVIRUS 2 BY RT PCR (HOSPITAL ORDER, [facility])  MAGNESIUM  AMMONIA  PATHOLOGIST SMEAR REVIEW  TROPONIN I (HIGH SENSITIVITY)    EKG EKG Interpretation  Date/Time:  Friday August 07 2020 17:01:50 EST Ventricular Rate:  134 PR Interval:    QRS Duration: 99 QT Interval:  288 QTC Calculation: 430 R Axis:   87 Text Interpretation: Atrial fibrillation Borderline right axis deviation Borderline repolarization abnormality Confirmed by Madalyn Rob [id number]) on 08/07/2020 6:03:51 PM   Radiology DG Chest Portable 1 View  Result Date: 08/07/2020 CLINICAL DATA:  Shortness of breath.  Cough. EXAM: PORTABLE CHEST 1 VIEW COMPARISON:  Most recent radiograph 04/22/2020 FINDINGS: Right pleural effusion is increased from prior exam, now with fluid  in the right minor fissure. Associated right basilar opacity favors atelectasis. Cardiomegaly is unchanged. No convincing left pleural effusion. Vascular congestion without frank edema. No pneumothorax. Bones are diffusely under mineralized. Healing or remote proximal left humerus fracture, partially included. IMPRESSION: 1. Increasing right pleural effusion, now with fluid in the right minor fissure. Associated right basilar opacity favors atelectasis. 2. Cardiomegaly with vascular congestion. Electronically Signed   By: Keith Rake M.D.   On: 08/07/2020 18:00    Procedures Procedures   Medications Ordered in ED Medications  potassium chloride 10 mEq in 100 mL IVPB (10 mEq Intravenous New Bag/Given 08/07/20 2206)  metoprolol tartrate (LOPRESSOR) injection 5 mg (5 mg Intravenous Given 08/07/20 2058)  potassium chloride SA (KLOR-CON) CR tablet 40 mEq (40 mEq Oral Given 08/07/20 2051)  furosemide (LASIX) injection 60 mg (60 mg Intravenous Given 08/07/20 2032)  carvedilol (COREG) tablet 6.25 mg (6.25 mg Oral Given 08/07/20 2057)    ED Course  I have reviewed the triage vital signs and the nursing notes.  Pertinent labs & imaging results that were available during my care of the patient were reviewed by me and considered in my medical decision making (see chart for details).  MDM Rules/Calculators/A&P                         65 year old lady presents to ER for multiple concerns including generalized weakness, shortness of breath, cough, leg swelling.  On exam patient was noted to be in A. fib with RVR, pitting edema in both legs extending to her lower abdomen.  CXR with increasing pleural effusion and cardiomegaly with vascular congestion.  Labs noted for bump in creatinine, hypokalemia, profoundly elevated BNP.  Presentation concerning for acute on chronic heart failure exacerbation.  Provided patient as needed doses of IV metoprolol, started on Coreg, started IV diuresis, replace  potassium.  Consult to medicine for admission, Dr. [place] Crane with [facility] accepting.   Final Clinical Impression(s) / ED Diagnoses Final diagnoses:  Atrial fibrillation, rapid ([place])  Heart failure, unspecified HF chronicity, unspecified heart failure type (HCC)  Hypokalemia  AKI (acute kidney injury) ([place])  Anemia, unspecified type    Rx / DC Orders ED Discharge Orders    None       Lucrezia Starch, MD 08/07/20 2258
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
## 2020-08-07 NOTE — H&P (Incomplete)
History and Physical  Vanessa [name] AJO:[id number] DOB: 1956/05/27 DOA: 08/07/2020  Referring physician: Dr. Roslynn Amble, [place] PCP: Patient, No Pcp Per  Outpatient Specialists: Oncology, radiation oncology, gynecology oncology Patient coming from: Home.  Chief Complaint: Worsening legs swelling, generalized weakness  HPI: Vanessa [name] is a 65 y.o. female with medical history significant for endometrial cancer diagnosed in 2021, previously treated COVID-19 viral pneumonia in 2021, MSSA bacteremia, essential hypertension, hyperlipidemia, esophageal dysmotility, type 2 diabetes, CKD 3, asthma, anemia of chronic disease who presented to [facility] ED due to worsening edema involving her lower extremities up to her abdomen.  States she ran out of her medications 2 weeks ago.  Associated with generalized weakness and shortness of breath.  Denies any chest pain.  Endorses that she has had a poor appetite.  Work-up in the ED revealed volume overload for which she was started on IV diuretics by EDP and Afib with RvR.  TRH was asked to admit.  ED Course: ***  Review of Systems: Review of systems as noted in the HPI. All other systems reviewed and are negative.   Past Medical History:  Diagnosis Date  . Anemia 10/09/2019  . Asthma   . Cellulitis and abscess of lower extremity 04/22/2020  . CKD (chronic kidney disease) stage 3, GFR 30-59 ml/min (HCC)   . Dental disease   . Diabetes mellitus without complication ([place])   . Endometrial cancer ([place])   . Esophageal dysmotility   . Heart murmur   . HLD (hyperlipidemia)   . Hypertension   . MSSA bacteremia 09/2019  . Obesity, Class III, BMI 40-49.9 (morbid obesity) ([place])   . Pressure ulcer   . Vaginal bleeding 05/2020   Past Surgical History:  Procedure Laterality Date  . BIOPSY  10/08/2019   Procedure: BIOPSY;  Surgeon: Clarene Essex, MD;  Location: [facility] ENDOSCOPY;  Service: Endoscopy;;  . ESOPHAGOGASTRODUODENOSCOPY N/A 10/08/2019   Procedure:  ESOPHAGOGASTRODUODENOSCOPY (EGD);  Surgeon: Clarene Essex, MD;  Location: [place];  Service: Endoscopy;  Laterality: N/A;  . TYMPANOSTOMY TUBE PLACEMENT Bilateral     Social History:  reports that she has quit smoking. She has never used smokeless tobacco. She reports current alcohol use. She reports that she does not use drugs.   Allergies  Allergen Reactions  . Ace Inhibitors Other (See Comments)    Never prescribed but she has had angioedema  . Angiotensin Receptor Blockers Other (See Comments)    ARB never Rxed but PMH  of angioedema in March 2021    Family History  Problem Relation Age of Onset  . Stroke Mother   . Diabetes Mother   . Hypertension Mother   . Cancer Mother        possible ovarian  . Diabetes Brother   . Hypertension Brother   . Cancer Maternal Grandmother        possible ovarian    ***  Prior to Admission medications   Medication Sig Start Date End Date Taking? Authorizing Provider  acetaminophen (TYLENOL) 500 MG tablet Take 500 mg by mouth every 6 (six) hours as needed (pain).     [provider]  Amino Acids-Protein Hydrolys (FEEDING SUPPLEMENT, PRO-STAT SUGAR FREE 64,) LIQD Take 30 mLs by mouth 3 (three) times daily with meals. Patient not taking: No sig reported 11/21/19   Jonetta Osgood, MD  amLODipine (NORVASC) 10 MG tablet Take 1 tablet (10 mg total) by mouth daily. 04/29/20   Swayze, Ava, DO  aspirin 81 MG chewable tablet  Chew 81 mg by mouth daily.    [provider]  carvedilol (COREG) 6.25 MG tablet Take 1 tablet (6.25 mg total) by mouth 2 (two) times daily with a meal. 12/13/19   Medina-Vargas, Monina C, NP  cloNIDine (CATAPRES) 0.2 MG tablet Take 1 tablet (0.2 mg total) by mouth 2 (two) times daily. Patient taking differently: Take 0.2 mg by mouth 2 (two) times daily with a meal. 12/13/19   Medina-Vargas, Monina C, NP  diphenhydrAMINE (BENADRYL) 25 MG tablet Take 25 mg by mouth See admin instructions. Take one tablet (25 mg)  by mouth three times daily, may also take one tablet (25 mg) every 8 hours as needed for itching for 14 days - 05/15/2020 thru 05/29/2020    [provider]  ferrous sulfate 325 (65 FE) MG tablet Take 1 tablet (325 mg total) by mouth 2 (two) times daily with a meal. 12/13/19   Medina-Vargas, Monina C, NP  furosemide (LASIX) 20 MG tablet Take 60 mg by mouth 2 (two) times daily. 9am, 6pm    [provider]  hydrALAZINE (APRESOLINE) 100 MG tablet Take 1 tablet (100 mg total) by mouth every 8 (eight) hours. 04/30/20   Antonieta Pert, MD  hydrocerin (EUCERIN) CREA Apply 1 application topically daily. 04/29/20   Swayze, Ava, DO  hydrocortisone (ANUSOL-HC) 2.5 % rectal cream Place 1 application rectally every 12 (twelve) hours as needed for hemorrhoids.     [provider]  hydrocortisone (ANUSOL-HC) 25 MG suppository Place 25 mg rectally daily.    [provider]  loratadine (CLARITIN) 10 MG tablet Take 1 tablet (10 mg total) by mouth daily as needed for allergies. 12/13/19   Medina-Vargas, Monina C, NP  metolazone (ZAROXOLYN) 5 MG tablet Take 5 mg by mouth daily.    [provider]  Multiple Vitamin (MULTIVITAMIN WITH MINERALS) TABS tablet Take 1 tablet by mouth daily. 04/29/20   Swayze, Ava, DO  nutrition supplement, JUVEN, (JUVEN) PACK Take 1 packet by mouth 2 (two) times daily between meals. 04/29/20   Swayze, Ava, DO  polyethylene glycol (MIRALAX / GLYCOLAX) 17 g packet Take 17 g by mouth daily. 09/25/19   Arrien, Jimmy Picket, MD  sodium bicarbonate 650 MG tablet Take 2 tablets (1,300 mg total) by mouth 2 (two) times daily. Patient taking differently: Take 1,300 mg by mouth 2 (two) times daily with a meal. 04/30/20   Antonieta Pert, MD    Physical Exam: BP 121/75   Pulse (!) 118   Temp 98.6 F (37 C) (Oral)   Resp 17   Ht 4\' 11"  (1.499 m)   Wt 117.9 kg   LMP 03/17/2015   SpO2 100%   BMI 52.51 kg/m   . General: 65 y.o. year-old female well developed well  nourished in no acute distress.  Alert and oriented x3. . Cardiovascular: Regular rate and rhythm with no rubs or gallops.  No thyromegaly or JVD noted.  No lower extremity edema. 2/4 pulses in all 4 extremities. Marland Kitchen Respiratory: Clear to auscultation with no wheezes or rales. Good inspiratory effort. . Abdomen: Soft nontender nondistended with normal bowel sounds x4 quadrants. . Muskuloskeletal: No cyanosis, clubbing or edema noted bilaterally . Neuro: CN II-XII intact, strength, sensation, reflexes . Skin: No ulcerative lesions noted or rashes . Psychiatry: Judgement and insight appear normal. Mood is appropriate for condition and setting          Labs on Admission:  Basic Metabolic Panel: Recent Labs  Lab 08/07/20 1736  NA 142  K 2.8*  CL 102  CO2 27  GLUCOSE 90  BUN 37*  CREATININE 3.18*  CALCIUM 7.7*  MG 2.2   Liver Function Tests: Recent Labs  Lab 08/07/20 1736  AST 48*  ALT 60*  ALKPHOS 90  BILITOT 0.9  PROT 6.7  ALBUMIN 2.5*   No results for input(s): LIPASE, AMYLASE in the last 168 hours. Recent Labs  Lab 08/07/20 1740  AMMONIA 26   CBC: Recent Labs  Lab 08/07/20 1736  WBC 6.1  NEUTROABS 5.3  HGB 8.8*  HCT 29.7*  MCV 98.7  PLT 251   Cardiac Enzymes: No results for input(s): CKTOTAL, CKMB, CKMBINDEX, TROPONINI in the last 168 hours.  BNP (last 3 results) Recent Labs    11/09/19 0430 02/14/20 2012 08/07/20 1736  BNP 1,043.9* 1,926.7* 2,227.0*    ProBNP (last 3 results) No results for input(s): PROBNP in the last 8760 hours.  CBG: No results for input(s): GLUCAP in the last 168 hours.  Radiological Exams on Admission: DG Chest Portable 1 View  Result Date: 08/07/2020 CLINICAL DATA:  Shortness of breath.  Cough. EXAM: PORTABLE CHEST 1 VIEW COMPARISON:  Most recent radiograph 04/22/2020 FINDINGS: Right pleural effusion is increased from prior exam, now with fluid in the right minor fissure. Associated right basilar opacity favors  atelectasis. Cardiomegaly is unchanged. No convincing left pleural effusion. Vascular congestion without frank edema. No pneumothorax. Bones are diffusely under mineralized. Healing or remote proximal left humerus fracture, partially included. IMPRESSION: 1. Increasing right pleural effusion, now with fluid in the right minor fissure. Associated right basilar opacity favors atelectasis. 2. Cardiomegaly with vascular congestion. Electronically Signed   By: Keith Rake M.D.   On: 08/07/2020 18:00    EKG: I independently viewed the EKG done and my findings are as followed: ***   Assessment/Plan Present on Admission: **None**  Active Problems:   Acute exacerbation of CHF (congestive heart failure) (HCC)   DVT prophylaxis: ***   Code Status: ***   Family Communication: ***   Disposition Plan: ***   Consults called: ***   Admission status: ***    Status is: Inpatient  {Inpatient:23812}  Dispo:  Patient From: Home  Planned Disposition: Home with Health Care Svc  Expected discharge date: 08/10/2020  Medically stable for discharge: No         Kayleen Memos MD [facility] Pager [phone]  If 7PM-7AM, please contact night-coverage [email or web address] Password [facility]  08/07/2020, 11:14 PM

## 2020-08-08 ENCOUNTER — Inpatient Hospital Stay (HOSPITAL_COMMUNITY): Payer: Medicare Other

## 2020-08-08 ENCOUNTER — Encounter (HOSPITAL_COMMUNITY): Payer: Self-pay | Admitting: Internal Medicine

## 2020-08-08 DIAGNOSIS — C541 Malignant neoplasm of endometrium: Secondary | ICD-10-CM

## 2020-08-08 DIAGNOSIS — I5031 Acute diastolic (congestive) heart failure: Secondary | ICD-10-CM | POA: Diagnosis not present

## 2020-08-08 DIAGNOSIS — I4891 Unspecified atrial fibrillation: Secondary | ICD-10-CM | POA: Diagnosis not present

## 2020-08-08 DIAGNOSIS — J9601 Acute respiratory failure with hypoxia: Secondary | ICD-10-CM | POA: Diagnosis not present

## 2020-08-08 DIAGNOSIS — R778 Other specified abnormalities of plasma proteins: Secondary | ICD-10-CM

## 2020-08-08 DIAGNOSIS — N179 Acute kidney failure, unspecified: Secondary | ICD-10-CM | POA: Diagnosis not present

## 2020-08-08 DIAGNOSIS — E876 Hypokalemia: Secondary | ICD-10-CM

## 2020-08-08 DIAGNOSIS — Z515 Encounter for palliative care: Secondary | ICD-10-CM

## 2020-08-08 DIAGNOSIS — I10 Essential (primary) hypertension: Secondary | ICD-10-CM

## 2020-08-08 DIAGNOSIS — Z7189 Other specified counseling: Secondary | ICD-10-CM | POA: Diagnosis not present

## 2020-08-08 DIAGNOSIS — N1832 Chronic kidney disease, stage 3b: Secondary | ICD-10-CM

## 2020-08-08 DIAGNOSIS — I129 Hypertensive chronic kidney disease with stage 1 through stage 4 chronic kidney disease, or unspecified chronic kidney disease: Secondary | ICD-10-CM

## 2020-08-08 DIAGNOSIS — N183 Chronic kidney disease, stage 3 unspecified: Secondary | ICD-10-CM

## 2020-08-08 LAB — ECHOCARDIOGRAM LIMITED
Height: 59 in
S' Lateral: 2.6 cm
Single Plane A4C EF: 36.1 %
Weight: 4049.41 oz

## 2020-08-08 LAB — BASIC METABOLIC PANEL
Anion gap: 11 (ref 5–15)
BUN: 36 mg/dL — ABNORMAL HIGH (ref 8–23)
CO2: 25 mmol/L (ref 22–32)
Calcium: 7.6 mg/dL — ABNORMAL LOW (ref 8.9–10.3)
Chloride: 105 mmol/L (ref 98–111)
Creatinine, Ser: 3.2 mg/dL — ABNORMAL HIGH (ref 0.44–1.00)
GFR, Estimated: 15 mL/min — ABNORMAL LOW (ref >60)
Glucose, Bld: 104 mg/dL — ABNORMAL HIGH (ref 70–99)
Potassium: 3.2 mmol/L — ABNORMAL LOW (ref 3.5–5.1)
Sodium: 141 mmol/L (ref 135–145)

## 2020-08-08 MED ORDER — HEPARIN SODIUM (PORCINE) 5000 UNIT/ML IJ SOLN
5000.0000 [IU] | Freq: Three times a day (TID) | INTRAMUSCULAR | Status: DC
  Administered 2020-08-08 – 2020-08-18 (×32): 5000 [IU] via SUBCUTANEOUS
  Filled 2020-08-08 (×32): qty 1

## 2020-08-08 MED ORDER — LORATADINE 10 MG PO TABS
10.0000 mg | ORAL_TABLET | Freq: Every day | ORAL | Status: DC | PRN
  Administered 2020-08-12 – 2020-08-18 (×3): 10 mg via ORAL
  Filled 2020-08-08 (×3): qty 1

## 2020-08-08 MED ORDER — METOPROLOL TARTRATE 5 MG/5ML IV SOLN
5.0000 mg | Freq: Four times a day (QID) | INTRAVENOUS | Status: DC | PRN
  Administered 2020-08-08 – 2020-08-18 (×6): 5 mg via INTRAVENOUS
  Filled 2020-08-08 (×6): qty 5

## 2020-08-08 MED ORDER — CARVEDILOL 3.125 MG PO TABS
3.1250 mg | ORAL_TABLET | Freq: Two times a day (BID) | ORAL | Status: DC
  Administered 2020-08-08 – 2020-08-09 (×4): 3.125 mg via ORAL
  Filled 2020-08-08 (×4): qty 1

## 2020-08-08 MED ORDER — GUAIFENESIN-DM 100-10 MG/5ML PO SYRP
5.0000 mL | ORAL_SOLUTION | ORAL | Status: DC | PRN
  Administered 2020-08-08 – 2020-08-16 (×15): 5 mL via ORAL
  Filled 2020-08-08 (×15): qty 5

## 2020-08-08 MED ORDER — SODIUM BICARBONATE 650 MG PO TABS
1300.0000 mg | ORAL_TABLET | Freq: Two times a day (BID) | ORAL | Status: DC
  Administered 2020-08-08 – 2020-08-16 (×17): 1300 mg via ORAL
  Filled 2020-08-08 (×17): qty 2

## 2020-08-08 MED ORDER — POLYETHYLENE GLYCOL 3350 17 G PO PACK
17.0000 g | PACK | Freq: Every day | ORAL | Status: DC
  Administered 2020-08-10 – 2020-08-18 (×5): 17 g via ORAL
  Filled 2020-08-08 (×7): qty 1

## 2020-08-08 MED ORDER — POTASSIUM CHLORIDE CRYS ER 20 MEQ PO TBCR: 20.0000 meq | EXTENDED_RELEASE_TABLET | Freq: Two times a day (BID) | ORAL | Status: DC

## 2020-08-08 MED ORDER — POTASSIUM CHLORIDE CRYS ER 20 MEQ PO TBCR
40.0000 meq | EXTENDED_RELEASE_TABLET | Freq: Every day | ORAL | Status: DC
  Filled 2020-08-08: qty 2

## 2020-08-08 MED ORDER — FUROSEMIDE 10 MG/ML IJ SOLN
80.0000 mg | Freq: Two times a day (BID) | INTRAMUSCULAR | Status: AC
  Administered 2020-08-08 (×2): 80 mg via INTRAVENOUS
  Filled 2020-08-08 (×4): qty 8

## 2020-08-08 MED ORDER — METOLAZONE 5 MG PO TABS
5.0000 mg | ORAL_TABLET | Freq: Every day | ORAL | Status: DC
  Administered 2020-08-08 – 2020-08-09 (×2): 5 mg via ORAL
  Filled 2020-08-08 (×2): qty 1

## 2020-08-08 MED ORDER — INFLUENZA VAC A&B SA ADJ QUAD 0.5 ML IM PRSY
0.5000 mL | PREFILLED_SYRINGE | INTRAMUSCULAR | Status: AC
  Administered 2020-08-09: 0.5 mL via INTRAMUSCULAR
  Filled 2020-08-08: qty 0.5

## 2020-08-08 MED ORDER — JUVEN PO PACK
1.0000 | PACK | Freq: Two times a day (BID) | ORAL | Status: DC
  Administered 2020-08-08 – 2020-08-18 (×18): 1 via ORAL
  Filled 2020-08-08 (×18): qty 1

## 2020-08-08 MED ORDER — FUROSEMIDE 10 MG/ML IJ SOLN
40.0000 mg | Freq: Two times a day (BID) | INTRAMUSCULAR | Status: DC
  Administered 2020-08-08: 40 mg via INTRAVENOUS
  Filled 2020-08-08: qty 4

## 2020-08-08 MED ORDER — POTASSIUM CHLORIDE CRYS ER 20 MEQ PO TBCR
40.0000 meq | EXTENDED_RELEASE_TABLET | Freq: Two times a day (BID) | ORAL | Status: DC
  Administered 2020-08-08 – 2020-08-09 (×3): 40 meq via ORAL
  Filled 2020-08-08 (×3): qty 2

## 2020-08-08 MED ORDER — ASPIRIN 81 MG PO CHEW
81.0000 mg | CHEWABLE_TABLET | Freq: Every day | ORAL | Status: DC
  Administered 2020-08-08 – 2020-08-18 (×11): 81 mg via ORAL
  Filled 2020-08-08 (×11): qty 1

## 2020-08-08 MED ORDER — ADULT MULTIVITAMIN W/MINERALS CH: 1.0000 | ORAL_TABLET | Freq: Every day | ORAL | Status: DC

## 2020-08-08 MED ORDER — HYDROCORTISONE (PERIANAL) 2.5 % EX CREA
1.0000 "application " | TOPICAL_CREAM | Freq: Two times a day (BID) | CUTANEOUS | Status: DC | PRN
  Administered 2020-08-08 – 2020-08-14 (×3): 1 via RECTAL
  Filled 2020-08-08 (×4): qty 28.35

## 2020-08-08 MED ORDER — FERROUS SULFATE 325 (65 FE) MG PO TABS
325.0000 mg | ORAL_TABLET | Freq: Two times a day (BID) | ORAL | Status: DC
  Administered 2020-08-08 – 2020-08-18 (×21): 325 mg via ORAL
  Filled 2020-08-08 (×21): qty 1

## 2020-08-08 NOTE — Progress Notes (Signed)
Patient incontinent of urine, but unclear amount due to using warm compresses on anus for hemorroidal pain.  Bladder scan done for 200cc of urine.  Will continue to monitor output.

## 2020-08-08 NOTE — H&P (Addendum)
History and Physical  Vanessa [name] QQV:[id number] DOB: [birth year] DOA: 08/07/2020  Referring physician: Dr. Roslynn Amble, [place] PCP: Patient, No Pcp Per  Outpatient Specialists: Oncology, radiation oncology, gynecology oncology Patient coming from: Home.  Chief Complaint: Worsening legs swelling, generalized weakness  HPI: Vanessa [name] is a 65 y.o. female with medical history significant for endometrial cancer diagnosed in 2021, previously treated COVID-19 viral pneumonia in 2021, MSSA bacteremia, essential hypertension, hyperlipidemia, esophageal dysmotility, type 2 diabetes, CKD 3, asthma, anemia of chronic disease who presented to [facility] ED due to worsening edema involving her lower extremities up to her abdomen.  States she ran out of her medications 2 weeks ago.  Associated with generalized weakness and shortness of breath.  Denies any chest pain.  Endorses that she has had a poor appetite.  Work-up in the ED revealed volume overload for which she was started on IV diuretics by EDP and Afib with RvR.  TRH was asked to admit.  ED Course: Afebrile, heart rate 116, respiratory rate 22, BP not at goal.  Lab studies remarkable for troponin initial 47 then 40, BNP greater than 2200, potassium 2.8, BUN 37, creatinine 3.18 with baseline creatinine of 2.4, AST and ALT elevated.  Chest x-ray showing cardiomegaly, pulmonary edema and right pleural effusion.  Review of Systems: Review of systems as noted in the HPI. All other systems reviewed and are negative.   Past Medical History:  Diagnosis Date  . Anemia 10/09/2019  . Asthma   . Cellulitis and abscess of lower extremity 04/22/2020  . CKD (chronic kidney disease) stage 3, GFR 30-59 ml/min (HCC)   . Dental disease   . Diabetes mellitus without complication ([place])   . Endometrial cancer ([place])   . Esophageal dysmotility   . Heart murmur   . HLD (hyperlipidemia)   . Hypertension   . MSSA bacteremia 09/2019  . Obesity, Class III, BMI 40-49.9  (morbid obesity) ([place])   . Pressure ulcer   . Vaginal bleeding 05/2020   Past Surgical History:  Procedure Laterality Date  . BIOPSY  10/08/2019   Procedure: BIOPSY;  Surgeon: Clarene Essex, MD;  Location: [facility] ENDOSCOPY;  Service: Endoscopy;;  . ESOPHAGOGASTRODUODENOSCOPY N/A 10/08/2019   Procedure: ESOPHAGOGASTRODUODENOSCOPY (EGD);  Surgeon: Clarene Essex, MD;  Location: [place];  Service: Endoscopy;  Laterality: N/A;  . TYMPANOSTOMY TUBE PLACEMENT Bilateral     Social History:  reports that she has quit smoking. She has never used smokeless tobacco. She reports current alcohol use. She reports that she does not use drugs.   Allergies  Allergen Reactions  . Ace Inhibitors Other (See Comments)    Never prescribed but she has had angioedema  . Angiotensin Receptor Blockers Other (See Comments)    ARB never Rxed but PMH  of angioedema in March 2021    Family History  Problem Relation Age of Onset  . Stroke Mother   . Diabetes Mother   . Hypertension Mother   . Cancer Mother        possible ovarian  . Diabetes Brother   . Hypertension Brother   . Cancer Maternal Grandmother        possible ovarian      Prior to Admission medications   Medication Sig Start Date End Date Taking? Authorizing Provider  acetaminophen (TYLENOL) 500 MG tablet Take 500 mg by mouth every 6 (six) hours as needed (pain).     [provider]  Amino Acids-Protein Hydrolys (FEEDING SUPPLEMENT, PRO-STAT SUGAR FREE 64,) LIQD Take  30 mLs by mouth 3 (three) times daily with meals. Patient not taking: No sig reported 11/21/19   Jonetta Osgood, MD  amLODipine (NORVASC) 10 MG tablet Take 1 tablet (10 mg total) by mouth daily. 04/29/20   Swayze, Ava, DO  aspirin 81 MG chewable tablet Chew 81 mg by mouth daily.    [provider]  carvedilol (COREG) 6.25 MG tablet Take 1 tablet (6.25 mg total) by mouth 2 (two) times daily with a meal. 12/13/19   Medina-Vargas, Monina C, NP  cloNIDine (CATAPRES)  0.2 MG tablet Take 1 tablet (0.2 mg total) by mouth 2 (two) times daily. Patient taking differently: Take 0.2 mg by mouth 2 (two) times daily with a meal. 12/13/19   Medina-Vargas, Monina C, NP  diphenhydrAMINE (BENADRYL) 25 MG tablet Take 25 mg by mouth See admin instructions. Take one tablet (25 mg) by mouth three times daily, may also take one tablet (25 mg) every 8 hours as needed for itching for 14 days - 05/15/2020 thru 05/29/2020    [provider]  ferrous sulfate 325 (65 FE) MG tablet Take 1 tablet (325 mg total) by mouth 2 (two) times daily with a meal. 12/13/19   Medina-Vargas, Monina C, NP  furosemide (LASIX) 20 MG tablet Take 60 mg by mouth 2 (two) times daily. 9am, 6pm    [provider]  hydrALAZINE (APRESOLINE) 100 MG tablet Take 1 tablet (100 mg total) by mouth every 8 (eight) hours. 04/30/20   Antonieta Pert, MD  hydrocerin (EUCERIN) CREA Apply 1 application topically daily. 04/29/20   Swayze, Ava, DO  hydrocortisone (ANUSOL-HC) 2.5 % rectal cream Place 1 application rectally every 12 (twelve) hours as needed for hemorrhoids.     [provider]  hydrocortisone (ANUSOL-HC) 25 MG suppository Place 25 mg rectally daily.    [provider]  loratadine (CLARITIN) 10 MG tablet Take 1 tablet (10 mg total) by mouth daily as needed for allergies. 12/13/19   Medina-Vargas, Monina C, NP  metolazone (ZAROXOLYN) 5 MG tablet Take 5 mg by mouth daily.    [provider]  Multiple Vitamin (MULTIVITAMIN WITH MINERALS) TABS tablet Take 1 tablet by mouth daily. 04/29/20   Swayze, Ava, DO  nutrition supplement, JUVEN, (JUVEN) PACK Take 1 packet by mouth 2 (two) times daily between meals. 04/29/20   Swayze, Ava, DO  polyethylene glycol (MIRALAX / GLYCOLAX) 17 g packet Take 17 g by mouth daily. 09/25/19   Arrien, Jimmy Picket, MD  sodium bicarbonate 650 MG tablet Take 2 tablets (1,300 mg total) by mouth 2 (two) times daily. Patient taking differently: Take 1,300 mg by  mouth 2 (two) times daily with a meal. 04/30/20   Antonieta Pert, MD    Physical Exam: BP 121/75   Pulse (!) 118   Temp 98.6 F (37 C) (Oral)   Resp 17   Ht 4\' 11"  (1.499 m)   Wt 117.9 kg   LMP 03/17/2015   SpO2 100%   BMI 52.51 kg/m   . General: 65 y.o. year-old female severe morbid obese in no acute distress.  Alert and oriented x3. . Cardiovascular: Irregular rate and rhythm with no rubs or gallops.  No thyromegaly or JVD noted.  No lower extremity edema. 2/4 pulses in all 4 extremities. Marland Kitchen Respiratory: Mild rales at bases with no wheezing noted. Good inspiratory effort. . Abdomen: Soft nontender nondistended with normal bowel sounds x4 quadrants. . Muskuloskeletal: No cyanosis or clubbing.  4+ pitting edema in lower extremities  up to abdomen.   . Neuro: CN II-XII intact, strength, sensation, reflexes . Skin: No rashes noted.   Marland Kitchen Psychiatry: Judgement and insight appear normal. Mood is appropriate for condition and setting          Labs on Admission:  Basic Metabolic Panel: Recent Labs  Lab 08/07/20 1736  NA 142  K 2.8*  CL 102  CO2 27  GLUCOSE 90  BUN 37*  CREATININE 3.18*  CALCIUM 7.7*  MG 2.2   Liver Function Tests: Recent Labs  Lab 08/07/20 1736  AST 48*  ALT 60*  ALKPHOS 90  BILITOT 0.9  PROT 6.7  ALBUMIN 2.5*   No results for input(s): LIPASE, AMYLASE in the last 168 hours. Recent Labs  Lab 08/07/20 1740  AMMONIA 26   CBC: Recent Labs  Lab 08/07/20 1736  WBC 6.1  NEUTROABS 5.3  HGB 8.8*  HCT 29.7*  MCV 98.7  PLT 251   Cardiac Enzymes: No results for input(s): CKTOTAL, CKMB, CKMBINDEX, TROPONINI in the last 168 hours.  BNP (last 3 results) Recent Labs    11/09/19 0430 02/14/20 2012 08/07/20 1736  BNP 1,043.9* 1,926.7* 2,227.0*    ProBNP (last 3 results) No results for input(s): PROBNP in the last 8760 hours.  CBG: No results for input(s): GLUCAP in the last 168 hours.  Radiological Exams on Admission: DG Chest Portable 1  View  Result Date: 08/07/2020 CLINICAL DATA:  Shortness of breath.  Cough. EXAM: PORTABLE CHEST 1 VIEW COMPARISON:  Most recent radiograph 04/22/2020 FINDINGS: Right pleural effusion is increased from prior exam, now with fluid in the right minor fissure. Associated right basilar opacity favors atelectasis. Cardiomegaly is unchanged. No convincing left pleural effusion. Vascular congestion without frank edema. No pneumothorax. Bones are diffusely under mineralized. Healing or remote proximal left humerus fracture, partially included. IMPRESSION: 1. Increasing right pleural effusion, now with fluid in the right minor fissure. Associated right basilar opacity favors atelectasis. 2. Cardiomegaly with vascular congestion. Electronically Signed   By: Keith Rake M.D.   On: 08/07/2020 18:00    EKG: I independently viewed the EKG done and my findings are as followed: A. fib with RVR rate of 134 nonspecific ST-T changes.  Assessment/Plan Present on Admission: **None**  Active Problems:   Acute exacerbation of CHF (congestive heart failure) (HCC)  Acute on chronic diastolic CHF secondary to medication noncompliance Presented with worsening bilateral lower extremities edema after running out of her medications for 2 weeks Evidence of pulmonary edema on chest x-ray, personally reviewed showing increase in pulmonary vascularity, cardiomegaly, right pleural effusion. Elevated BNP greater than 2200. Ongoing diuresing, started in ED with IV Lasix. Strict I's and O's and daily weight Last 2D echo was on 09/18/2019 showing normal LVEF 55 to 60% with grade 2 diastolic dysfunction.  Mild to moderate mitral valve regurgitation. Repeat 2D echo  A. fib with RVR Presented with rate in the 130s Resume home Coreg at lower dose to avoid hypotension. Received IV Lopressor as needed with parameters She is not on oral anticoagulation, defer to her cardiologist Continue to monitor on telemetry  Elevated troponin  suspect demand ischemia in the setting of A. fib with RVR. Presented with troponin 47, downtrending to 40. She denies any anginal symptoms at the time of this visit. Follow results of 2D echo.  Oliguric AKI on CKD 4 Baseline creatinine appears to be 2.4 with GFR of 21 Presented with creatinine of 3.1 with GFR 16 Creatinine uptrending 3.20 with GFR 15. Avoid  nephrotoxins and hypotension. Minimal urine output, bladder scan showed less than 200 cc, could be inaccurate due to anasarca.  In and out cath. Consult nephrology in the morning  Refractory hypokalemia Presented with serum potassium 2.8 Repleted Repeat potassium 3.2, replete and recheck. Magnesium 2.2 Repeat BMP and replete as indicated  Bilateral lower extremity edema Presented with bilateral lower extremity edema Limitted mobility and malignancy Obtain bilateral Doppler ultrasound to rule out DVT Continue diuresing, elevate lower extremities  Anemia of chronic disease in the setting of advanced CKD Baseline hemoglobin appears to be 8 Hemoglobin stable 8.8 No overt bleeding  Endometrial cancer Per chart review she is not a candidate for hysterectomy Per chart review goal therapy was palliative in order to control the bleeding. Palliative care team consulted to assist with establishing goals of care. Consider radiation oncology consult in the morning  Severe morbid obesity BMI 52 Would benefit from healthy dieting.  Physical debility PT to assess Fall precautions       DVT prophylaxis: Subcu heparin 3 times daily  Code Status: Full code as stated by the patient herself  Family Communication: None at bedside.  Disposition Plan: Admit to telemetry medical.  Consults called: Palliative care team.  Admission status: Inpatient status.  Patient will require at least 2 midnights for further evaluation and treatment of present condition.   Status is: Inpatient    Dispo:  Patient From: Home  Planned  Disposition: Home with Health Care Svc  Expected discharge date: 08/10/2020  Medically stable for discharge: No, ongoing management of acute on chronic diastolic CHF.         Kayleen Memos MD [facility] Pager [phone]  If 7PM-7AM, please contact night-coverage [email or web address] Password [facility]  08/07/2020, 11:14 PM

## 2020-08-08 NOTE — Progress Notes (Signed)
Patient with tachycardia, HTN, no urine ouptput, and low temp.  Discussed with Dr. [place] Crane, orders received.

## 2020-08-08 NOTE — Progress Notes (Signed)
  Echocardiogram 2D Echocardiogram has been performed.  Vanessa [name] 08/08/2020, 9:51 AM

## 2020-08-08 NOTE — Progress Notes (Signed)
[facility] PROGRESS NOTE    Progress Note  Vanessa [name]  VOH:[id number] DOB: [birth year] DOA: 08/07/2020 PCP: Patient, No Pcp Per     Brief Narrative:   Vanessa [name] is an 65 y.o. female past medical history significant for endometrial carcinoma diagnosed in 2021, COVID-19 viral pneumonia 2021, MSSA bacteremia, essential hypertension, diabetes mellitus type 2, chronic kidney disease stage III, chronic diastolic heart failure, anemia of chronic disease comes into the ED with worsening lower extremity edema up to her abdomen, she relates she ran out of her medications about 2 weeks prior to admission.  Assessment/Plan:   Acute diastolic heart failure Bilateral lower extremity edema with a history of noncompliance with medication, chest x-ray showed bilateral effusions with cardiomegaly and pulmonary vascular congestion, BNP greater than 2000. Weight she has been less in 100 kg, on admission she was 107. Monitor electrolytes replete as needed. Strict I's and O's and daily weights limit her fluid intake. Repeated 2D echo is pending.  New onset A. fib with RVR: On admission with a heart rate of 130, she was noncompliant with her Coreg at home. Not a candidate for anticoagulation at this point as her hemoglobin is 8.8 and she is endometrial cancer with bleeding.  Elevated troponin: She denies any chest pain, her cardiac biomarkers have remained basically flat. There is likely demand ischemia in the setting of A. fib with RVR. 2D echo is pending.  Acute kidney injury on chronic kidney disease stage IV: With a baseline creatinine around 2.4.  On admission 3.1. Question due to volume overload we will continue IV aggressive diuresis monitor strict I's and O's and daily weights.  Refractory hypokalemia Potassium is slowly increasing we will continue to replete orally recheck in the morning.  Bilateral lower extremity edema: Likely due to volume overload.  See acute diastolic  heart failure for further details.  Anemia of chronic disease in the setting of chronic kidney stage IV and endometrial vaginal cancer with bleeding: Hemoglobin appears to be at baseline, there is no signs of overt bleeding.  Endometrial cancer: Going through the chart she is not a candidate for hysterectomy. Per chart review goal therapy was palliative in order to control her bleeding. Palliative of care has been consulted.  Severe morbid obesity: Noted counseling.  Deconditioning: Physical therapy has been consulted.  Stage II sacral decubitus ulcer present on admission RN Pressure Injury Documentation: Pressure Injury 05/29/20 Thigh Posterior;Proximal;Right Stage 2 -  Partial thickness loss of dermis presenting as a shallow open injury with a red, pink wound bed without slough. 3 small open areas (Active)  05/29/20 2000  Location: Thigh  Location Orientation: Posterior;Proximal;Right  Staging: Stage 2 -  Partial thickness loss of dermis presenting as a shallow open injury with a red, pink wound bed without slough.  Wound Description (Comments): 3 small open areas  Present on Admission: Yes    Estimated body mass index is 51.12 kg/m as calculated from the following:   Height as of this encounter: 4\' 11"  (1.499 m).   Weight as of this encounter: 114.8 kg.    DVT prophylaxis: lovenox Family Communication:none Status is: Inpatient  Remains inpatient appropriate because:Hemodynamically unstable   Dispo:  Patient From: Home  Planned Disposition: Home with Health Care Svc  Expected discharge date: 08/10/2020  Medically stable for discharge: No          Code Status:     Code Status Orders  (From admission, onward)  Start     Ordered   08/07/20 2307  Full code  Continuous        08/07/20 2306        Code Status History    Date Active Date Inactive Code Status Order ID Comments User Context   05/24/2020 7510 06/09/2020 2229 Full Code [id number]   Vanessa Hey, MD ED   04/22/2020 0433 04/30/2020 1940 Full Code [id number]  Vanessa Quill, DO ED   11/06/2019 1511 11/22/2019 1708 Full Code [id number]  Vanessa Jewel, MD ED   10/18/2019 1529 10/22/2019 0111 Full Code [id number]  Vanessa Ishikawa, MD ED   10/04/2019 2133 10/10/2019 2231 Full Code [id number]  Vanessa Bulls, MD ED   09/14/2019 2053 09/26/2019 0059 Full Code [id number]  Schertz, Michele Mcalpine, MD Inpatient   Advance Care Planning Activity        IV Access:    Peripheral IV   Procedures and diagnostic studies:   DG Chest Portable 1 View  Result Date: 08/07/2020 CLINICAL DATA:  Shortness of breath.  Cough. EXAM: PORTABLE CHEST 1 VIEW COMPARISON:  Most recent radiograph 04/22/2020 FINDINGS: Right pleural effusion is increased from prior exam, now with fluid in the right minor fissure. Associated right basilar opacity favors atelectasis. Cardiomegaly is unchanged. No convincing left pleural effusion. Vascular congestion without frank edema. No pneumothorax. Bones are diffusely under mineralized. Healing or remote proximal left humerus fracture, partially included. IMPRESSION: 1. Increasing right pleural effusion, now with fluid in the right minor fissure. Associated right basilar opacity favors atelectasis. 2. Cardiomegaly with vascular congestion. Electronically Signed   By: Keith Rake M.D.   On: 08/07/2020 18:00     Medical Consultants:    None.  Anti-Infectives:   none  Subjective:    Vanessa [name] she relates her breathing is unchanged compared to yesterday.  Objective:    Vitals:   08/08/20 0227 08/08/20 0230 08/08/20 0245 08/08/20 0421  BP:  112/84 (!) 141/92 131/82  Pulse:    (!) 117  Resp:    18  Temp: (!) 97.5 F (36.4 C)   97.8 F (36.6 C)  TempSrc: Oral   Oral  SpO2:    99%  Weight:    114.8 kg  Height:       SpO2: 99 %   Intake/Output Summary (Last 24 hours) at 08/08/2020 0747 Last data filed at 08/08/2020 0315 Gross per 24  hour  Intake --  Output 325 ml  Net -325 ml   Filed Weights   08/07/20 1700 08/08/20 0421  Weight: 117.9 kg 114.8 kg    Exam: General exam: In no acute distress. Respiratory system: Good air movement and crackles at bases bilaterally Cardiovascular system: S1 & S2 heard, RRR.  Positive JVD Gastrointestinal system: Abdomen is nondistended, soft and nontender.  Extremities: 3+ edema with edema in her sacrum.   Skin: No rashes, lesions or ulcers Psychiatry: Judgement and insight appear normal. Mood & affect appropriate.   Data Reviewed:    Labs: Basic Metabolic Panel: Recent Labs  Lab 08/07/20 1736 08/08/20 0044  NA 142 141  K 2.8* 3.2*  CL 102 105  CO2 27 25  GLUCOSE 90 104*  BUN 37* 36*  CREATININE 3.18* 3.20*  CALCIUM 7.7* 7.6*  MG 2.2  --    GFR Estimated Creatinine Clearance: 19.9 mL/min (A) (by C-G formula based on SCr of 3.2 mg/dL (H)). Liver Function Tests: Recent Labs  Lab 08/07/20 1736  AST 48*  ALT  60*  ALKPHOS 90  BILITOT 0.9  PROT 6.7  ALBUMIN 2.5*   No results for input(s): LIPASE, AMYLASE in the last 168 hours. Recent Labs  Lab 08/07/20 1740  AMMONIA 26   Coagulation profile Recent Labs  Lab 08/07/20 1736  INR 1.3*   COVID-19 Labs  No results for input(s): DDIMER, FERRITIN, LDH, CRP in the last 72 hours.  Lab Results  Component Value Date   SARSCOV2NAA NEGATIVE 08/07/2020   SARSCOV2NAA NEGATIVE 05/24/2020   [place] NEGATIVE 04/29/2020   [place] NEGATIVE 04/22/2020    CBC: Recent Labs  Lab 08/07/20 1736  WBC 6.1  NEUTROABS 5.3  HGB 8.8*  HCT 29.7*  MCV 98.7  PLT 251   Cardiac Enzymes: No results for input(s): CKTOTAL, CKMB, CKMBINDEX, TROPONINI in the last 168 hours. BNP (last 3 results) No results for input(s): PROBNP in the last 8760 hours. CBG: No results for input(s): GLUCAP in the last 168 hours. D-Dimer: No results for input(s): DDIMER in the last 72 hours. Hgb A1c: No results for input(s): HGBA1C  in the last 72 hours. Lipid Profile: No results for input(s): CHOL, HDL, LDLCALC, TRIG, CHOLHDL, LDLDIRECT in the last 72 hours. Thyroid function studies: No results for input(s): TSH, T4TOTAL, T3FREE, THYROIDAB in the last 72 hours.  Invalid input(s): FREET3 Anemia work up: No results for input(s): VITAMINB12, FOLATE, FERRITIN, TIBC, IRON, RETICCTPCT in the last 72 hours. Sepsis Labs: Recent Labs  Lab 08/07/20 1736  WBC 6.1   Microbiology Recent Results (from the past 240 hour(s))  SARS Coronavirus 2 by RT PCR (hospital order, performed in [facility] hospital lab) Nasopharyngeal Nasopharyngeal Swab     Status: None   Collection Time: 08/07/20  6:18 PM   Specimen: Nasopharyngeal Swab  Result Value Ref Range Status   SARS Coronavirus 2 NEGATIVE NEGATIVE Final    Comment: (NOTE) SARS-CoV-2 target nucleic acids are NOT DETECTED.  The SARS-CoV-2 RNA is generally detectable in upper and lower respiratory specimens during the acute phase of infection. The lowest concentration of SARS-CoV-2 viral copies this assay can detect is 250 copies / mL. A negative result does not preclude SARS-CoV-2 infection and should not be used as the sole basis for treatment or other patient management decisions.  A negative result may occur with improper specimen collection / handling, submission of specimen other than nasopharyngeal swab, presence of viral mutation(s) within the areas targeted by this assay, and inadequate number of viral copies (<250 copies / mL). A negative result must be combined with clinical observations, patient history, and epidemiological information.  Fact Sheet for Patients:   StrictlyIdeas.no  Fact Sheet for Healthcare Providers: BankingDealers.co.za  This test is not yet approved or  cleared by the [place] FDA and has been authorized for detection and/or diagnosis of SARS-CoV-2 by FDA under an Emergency Use  Authorization (EUA).  This EUA will remain in effect (meaning this test can be used) for the duration of the COVID-19 declaration under Section 564(b)(1) of the Act, 21 U.S.C. section 360bbb-3(b)(1), unless the authorization is terminated or revoked sooner.  Performed at [facility], [place] [address], [place], [place] [address]      Medications:   . aspirin  81 mg Oral Daily  . carvedilol  3.125 mg Oral BID WC  . ferrous sulfate  325 mg Oral BID WC  . furosemide  40 mg Intravenous BID  . heparin injection (subcutaneous)  5,000 Units Subcutaneous Q8H  . multivitamin with minerals  1 tablet  Oral Daily  . nutrition supplement (JUVEN)  1 packet Oral BID BM  . polyethylene glycol  17 g Oral Daily  . potassium chloride  40 mEq Oral Daily  . sodium bicarbonate  1,300 mg Oral BID WC   Continuous Infusions:    LOS: 1 day   Charlynne Cousins  [facility]  08/08/2020, 7:47 AM

## 2020-08-08 NOTE — Evaluation (Signed)
Physical Therapy Evaluation Patient Details Name: Vanessa [name] MRN: [id number] DOB: 1955-12-11 Today's Date: 08/08/2020   History of Present Illness  65 y.o. female past medical history significant for endometrial carcinoma diagnosed in 2021, COVID-19 viral pneumonia 2021, MSSA bacteremia, essential hypertension, diabetes mellitus type 2, chronic kidney disease stage III, chronic diastolic heart failure, anemia of chronic disease. Admitted with a. fib with RVR, acute diastolic heart failure, and hypokalemia.  Clinical Impression  Pt admitted with above diagnosis. Demonstrates generalized weakness requiring min assist for bed mobility and sit<>stand transfer. Previously mod I with bed mobility and ambulation at home. Pt feels too weak to ambulate safely today. Was unable to stand just prior to admission but tolerated several small steps laterally along bed today. HR elevated to 120 with minimal activity. Does not feel daughter can safely care for her in this state of dependence. Pt currently with functional limitations due to the deficits listed below (see PT Problem List). Pt will benefit from skilled PT to increase their independence and safety with mobility to allow discharge to the venue listed below.       Follow Up Recommendations SNF    Equipment Recommendations  None recommended by PT    Recommendations for Other Services       Precautions / Restrictions Precautions Precautions: Fall Restrictions Weight Bearing Restrictions: No      Mobility  Bed Mobility Overal bed mobility: Needs Assistance Bed Mobility: Sidelying to Sit   Sidelying to sit: Min assist       General bed mobility comments: Min assist for LEs to bring out of bed, able to push self with UEs to midline position and sit with single UE support initially, following cues and instructions able to adjust midline upright sitting without reliance on UE support.    Transfers Overall transfer level: Needs  assistance Equipment used: 1 person hand held assist Transfers: Sit to/from Stand Sit to Stand: Min assist         General transfer comment: Min assist for balance upon standing, able to boost from bed with cues for technique and hand placement. Good anterior weight shift. Once upright pt able to take small steps along bed with [facility] support. Practiced activity x2. Limited standing tolerance due to fatigue. HR at 120.  Ambulation/Gait                Stairs            Wheelchair Mobility    Modified Rankin (Stroke Patients Only)       Balance Overall balance assessment: Needs assistance Sitting-balance support: No upper extremity supported Sitting balance-Leahy Scale: Fair Sitting balance - Comments: narrow limits of stability, practiced reaching beyond BOS lateral and anteriorly.   Standing balance support: Single extremity supported Standing balance-Leahy Scale: Poor                               Pertinent Vitals/Pain Pain Assessment: No/denies pain    Home Living Family/patient expects to be discharged to:: Skilled nursing facility Living Arrangements: Children Available Help at Discharge: Family;Available PRN/intermittently (daughter cares for 4 younger kids.) Type of Home: Apartment Home Access: Level entry     Home Layout: One level Home Equipment: Walker - 2 wheels;Cane - single point;Bedside commode      Prior Function Level of Independence: Needs assistance   Gait / Transfers Assistance Needed: States she could walk with RW at home, short distances.  ADL's / Homemaking Assistance Needed: States daughter assisted with all ADLs.        Hand Dominance   Dominant Hand: Right    Extremity/Trunk Assessment   Upper Extremity Assessment Upper Extremity Assessment: Defer to OT evaluation    Lower Extremity Assessment Lower Extremity Assessment: Generalized weakness (Bil Edema)       Communication   Communication: No  difficulties  Cognition Arousal/Alertness: Lethargic Behavior During Therapy: WFL for tasks assessed/performed Overall Cognitive Status: Within Functional Limits for tasks assessed                                        General Comments General comments (skin integrity, edema, etc.): HR in low 120s upon sitting EOB. Easily fatigued.    Exercises General Exercises - Lower Extremity Long Arc Quad: Strengthening;Both;5 reps;Seated   Assessment/Plan    PT Assessment Patient needs continued PT services  PT Problem List Decreased strength;Decreased range of motion;Decreased activity tolerance;Decreased balance;Decreased mobility;Cardiopulmonary status limiting activity;Obesity;Decreased skin integrity       PT Treatment Interventions DME instruction;Gait training;Functional mobility training;Therapeutic activities;Therapeutic exercise;Balance training;Patient/family education    PT Goals (Current goals can be found in the Care Plan section)  Acute Rehab PT Goals Patient Stated Goal: Get her strength back PT Goal Formulation: With patient Time For Goal Achievement: 08/22/20 Potential to Achieve Goals: Good    Frequency Min 3X/week   Barriers to discharge Decreased caregiver support Daughter limited in ability to care for Pt at home.    Co-evaluation               AM-PAC PT "6 Clicks" Mobility  Outcome Measure Help needed turning from your back to your side while in a flat bed without using bedrails?: A Little Help needed moving from lying on your back to sitting on the side of a flat bed without using bedrails?: A Little Help needed moving to and from a bed to a chair (including a wheelchair)?: A Lot Help needed standing up from a chair using your arms (e.g., wheelchair or bedside chair)?: A Lot Help needed to walk in hospital room?: A Lot Help needed climbing 3-5 steps with a railing? : Total 6 Click Score: 13    End of Session Equipment Utilized  During Treatment: Gait belt Activity Tolerance: Patient limited by fatigue;Patient limited by lethargy Patient left: in bed;with call bell/phone within reach;with bed alarm set (sitting EOB with table tray eating lunch. RN notified.) Nurse Communication: Other (comment) (EOB eating lunch.) PT Visit Diagnosis: Unsteadiness on feet (R26.81);Muscle weakness (generalized) (M62.81);Difficulty in walking, not elsewhere classified (R26.2)    Time: 6761-9509 PT Time Calculation (min) (ACUTE ONLY): 23 min   Charges:   PT Evaluation $PT Eval Moderate Complexity: 1 Mod PT Treatments $Therapeutic Activity: 8-22 mins        Elayne Snare, PT, DPT  Ellouise Newer 08/08/2020, 12:47 PM
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
## 2020-08-09 ENCOUNTER — Inpatient Hospital Stay (HOSPITAL_COMMUNITY): Payer: Medicare Other

## 2020-08-09 DIAGNOSIS — N179 Acute kidney failure, unspecified: Secondary | ICD-10-CM | POA: Diagnosis not present

## 2020-08-09 DIAGNOSIS — I4891 Unspecified atrial fibrillation: Secondary | ICD-10-CM | POA: Diagnosis not present

## 2020-08-09 DIAGNOSIS — J9601 Acute respiratory failure with hypoxia: Secondary | ICD-10-CM | POA: Diagnosis not present

## 2020-08-09 DIAGNOSIS — I5031 Acute diastolic (congestive) heart failure: Secondary | ICD-10-CM | POA: Diagnosis not present

## 2020-08-09 LAB — BASIC METABOLIC PANEL
Anion gap: 13 (ref 5–15)
BUN: 47 mg/dL — ABNORMAL HIGH (ref 8–23)
CO2: 23 mmol/L (ref 22–32)
Calcium: 7.7 mg/dL — ABNORMAL LOW (ref 8.9–10.3)
Chloride: 105 mmol/L (ref 98–111)
Creatinine, Ser: 3.35 mg/dL — ABNORMAL HIGH (ref 0.44–1.00)
GFR, Estimated: 15 mL/min — ABNORMAL LOW (ref >60)
Glucose, Bld: 113 mg/dL — ABNORMAL HIGH (ref 70–99)
Potassium: 4.2 mmol/L (ref 3.5–5.1)
Sodium: 141 mmol/L (ref 135–145)

## 2020-08-09 MED ORDER — POTASSIUM CHLORIDE CRYS ER 20 MEQ PO TBCR
40.0000 meq | EXTENDED_RELEASE_TABLET | Freq: Two times a day (BID) | ORAL | Status: DC
  Administered 2020-08-09: 40 meq via ORAL
  Filled 2020-08-09: qty 2

## 2020-08-09 MED ORDER — CARVEDILOL 6.25 MG PO TABS
6.2500 mg | ORAL_TABLET | Freq: Two times a day (BID) | ORAL | Status: DC
  Administered 2020-08-09: 6.25 mg via ORAL
  Administered 2020-08-09: 3.125 mg via ORAL
  Administered 2020-08-10: 6.25 mg via ORAL
  Filled 2020-08-09 (×3): qty 1

## 2020-08-09 MED ORDER — ACETAMINOPHEN 325 MG PO TABS
650.0000 mg | ORAL_TABLET | Freq: Four times a day (QID) | ORAL | Status: DC | PRN
  Administered 2020-08-09 – 2020-08-18 (×12): 650 mg via ORAL
  Filled 2020-08-09 (×12): qty 2

## 2020-08-09 NOTE — TOC Initial Note (Signed)
Transition of Care [facility]) - Initial/Assessment Note    Patient Details  Name: Vanessa [name] MRN: [id number] Date of Birth: [birth year]  [facility]) CM/SW Contact:    Bary Castilla, LCSW Phone Number: [phone] 08/09/2020, 4:07 PM  Clinical Narrative:     CSW met with patient to discuss PT recommendation of a SNF. Patient was aware of recommendation and in agreement with going to a ST SNF. CSW discussed the SNF process.CSW provided patient with medicare.gov rating list.  Patient gave CSW permission to fax referrals out to local facilities.CSW answered questions about the SNF process and the next steps in the process. Patient informed CSW that she had been to [facility] and does not mind going back. Patient requested that CSW call daughter Vernie Shanks [phone]. CSW spoke with Vernie Shanks and updated her about discharge plans.  TOC team will continue to assist with discharge planning needs.                   Expected Discharge Plan: Skilled Nursing Facility Barriers to Discharge: Continued Medical Work up,SNF Pending bed offer   Patient Goals and CMS Choice Patient states their goals for this hospitalization and ongoing recovery are:: To be able to return home CMS Medicare.gov Compare Post Acute Care list provided to:: Patient Choice offered to / list presented to : Patient  Expected Discharge Plan and Services Expected Discharge Plan: [place]       Living arrangements for the past 2 months: Mobile Home                                      Prior Living Arrangements/Services Living arrangements for the past 2 months: Mobile Home Lives with:: [place] Patient language and need for interpreter reviewed:: Yes          Care giver support system in place?: Yes (comment)      Activities of Daily Living Home Assistive Devices/Equipment: Gilford Rile (specify type) ADL Screening (condition at time of  admission) Patient's cognitive ability adequate to safely complete daily activities?: Yes Is the patient deaf or have difficulty hearing?: No Does the patient have difficulty seeing, even when wearing glasses/contacts?: No Does the patient have difficulty concentrating, remembering, or making decisions?: No Patient able to express need for assistance with ADLs?: Yes Does the patient have difficulty dressing or bathing?: Yes Independently performs ADLs?: No Communication: Independent Dressing (OT): Needs assistance Is this a change from baseline?: Change from baseline, expected to last >3 days Grooming: Needs assistance Is this a change from baseline?: Change from baseline, expected to last >3 days Feeding: Independent Bathing: Needs assistance Is this a change from baseline?: Change from baseline, expected to last >3 days Toileting: Needs assistance Is this a change from baseline?: Change from baseline, expected to last >3days In/Out Bed: Needs assistance Is this a change from baseline?: Change from baseline, expected to last >3 days Walks in Home: Needs assistance Is this a change from baseline?: Change from baseline, expected to last >3 days Does the patient have difficulty walking or climbing stairs?: Yes Weakness of Legs: Both Weakness of Arms/Hands: None  Permission Sought/Granted   Permission granted to share information with : Yes, Verbal Permission Granted  Share Information with NAME: Vernie Shanks  Permission granted to share info w AGENCY: SNFs  Permission granted to share info w Relationship: Daughter  Permission granted  to share info w Contact Information: [phone]  Emotional Assessment Appearance:: Appears stated age Attitude/Demeanor/Rapport: Engaged Affect (typically observed): Adaptable,Accepting,Appropriate Orientation: : Oriented to Self,Oriented to Place,Oriented to  Time,Oriented to Situation      Admission diagnosis:  Hypokalemia [E87.6] Acute  exacerbation of CHF (congestive heart failure) (HCC) [I50.9] AKI (acute kidney injury) ([place]) [N17.9] Atrial fibrillation, rapid ([place]) [I48.91] Anemia, unspecified type [D64.9] Heart failure, unspecified HF chronicity, unspecified heart failure type [facility]) [I50.9] Patient Active Problem List   Diagnosis Date Noted  . Acute respiratory failure with hypoxia ([place]) 08/08/2020  . Acute diastolic CHF (congestive heart failure) ([place]) 08/08/2020  . Elevated troponin 08/08/2020  . Abnormal uterine bleeding due to primary malignant neoplasm of endometrium ([place]) 05/24/2020  . Cellulitis 04/23/2020  . Bilateral lower leg cellulitis 04/22/2020  . Neurocognitive deficits 12/11/2019  . Edema 12/04/2019  . AKI (acute kidney injury) ([place]) 12/03/2019  . Hypothermia 11/26/2019  . CKD (chronic kidney disease) stage 3, GFR 30-59 ml/min (HCC)   . Hypernatremia   . COVID-19 virus infection   . Generalized weakness   . Hypertensive urgency   . Allergic angioedema 10/19/2019  . Anasarca 10/18/2019  . Angioedema 10/18/2019  . Hypertensive emergency 10/18/2019  . Trichimoniasis 10/17/2019  . Macrocytic anemia 10/05/2019  . Esophageal dysmotility 10/04/2019  . Pressure injury of skin 09/22/2019  . Endometrial cancer ([place]) 09/21/2019  . Left humeral fracture 09/21/2019  . Iron deficiency anemia due to chronic blood loss 09/21/2019  . MSSA bacteremia 09/18/2019  . Postmenopausal bleeding 09/15/2019  . Thrombocytopenia ([place]) 09/15/2019  . Generalized pruritus 09/15/2019  . Benign hypertension with CKD (chronic kidney disease) stage III ([place]) 09/15/2019  . Hyperuricemia 09/15/2019  . Hypokalemia 09/15/2019  . Symptomatic anemia 09/14/2019  . Non compliance w medication regimen 03/24/2015  . Dyslipidemia 03/24/2015  . DM type 2 (diabetes mellitus, type 2) ([place]) 05/23/2014  . HTN (hypertension) 05/23/2014   PCP:  Patient, No Pcp Per Pharmacy:  No Pharmacies Listed    Social Determinants of Health  (SDOH) Interventions    Readmission Risk Interventions Readmission Risk Prevention Plan 11/08/2019 10/21/2019 09/25/2019  Transportation Screening Complete Complete Complete  PCP or Specialist Appt within 3-5 Days - - Not Complete  Not Complete comments - - plan for SNF  [place]RI or [place] - - Complete  Social Work Consult for [place] Planning/Counseling - - Complete  Palliative Care Screening - - Not Applicable  Medication Review Press photographer) Complete Referral to Pharmacy Referral to Pharmacy  PCP or Specialist appointment within 3-5 days of discharge Complete Complete -  [place] or Home Care Consult Complete (No Data) -  SW Recovery Care/Counseling Consult Complete Complete -  Palliative Care Screening Not Applicable Not Applicable -  Skilled Nursing Facility Complete Complete -  Some recent data might be hidden

## 2020-08-09 NOTE — Plan of Care (Signed)

## 2020-08-09 NOTE — NC FL2 (Signed)
[place] LEVEL OF CARE SCREENING TOOL     IDENTIFICATION  Patient Name: Vanessa [name] Birthdate: 1956/05/20 Sex: female Admission Date (Current Location): 08/07/2020  [facility] and [place] Number:  Herbalist and Address:  [facility], [place] [address], [place], [place] [address]      Provider Number: [id number]  Attending Physician Name and Address:  Charlynne Cousins, MD  Relative Name and Phone Number:  [id number] [phone] Echo: Hospital Recommended Level of Care: [place] Prior Approval Number:    Date Approved/Denied:   PASRR Number: [id number]  Discharge Plan: SNF    Current Diagnoses: Patient Active Problem List   Diagnosis Date Noted  . Acute respiratory failure with hypoxia ([place]) 08/08/2020  . Acute diastolic CHF (congestive heart failure) ([place]) 08/08/2020  . Elevated troponin 08/08/2020  . Abnormal uterine bleeding due to primary malignant neoplasm of endometrium ([place]) 05/24/2020  . Cellulitis 04/23/2020  . Bilateral lower leg cellulitis 04/22/2020  . Neurocognitive deficits 12/11/2019  . Edema 12/04/2019  . AKI (acute kidney injury) ([place]) 12/03/2019  . Hypothermia 11/26/2019  . CKD (chronic kidney disease) stage 3, GFR 30-59 ml/min (HCC)   . Hypernatremia   . COVID-19 virus infection   . Generalized weakness   . Hypertensive urgency   . Allergic angioedema 10/19/2019  . Anasarca 10/18/2019  . Angioedema 10/18/2019  . Hypertensive emergency 10/18/2019  . Trichimoniasis 10/17/2019  . Macrocytic anemia 10/05/2019  . Esophageal dysmotility 10/04/2019  . Pressure injury of skin 09/22/2019  . Endometrial cancer ([place]) 09/21/2019  . Left humeral fracture 09/21/2019  . Iron deficiency anemia due to chronic blood loss 09/21/2019  . MSSA bacteremia 09/18/2019  . Postmenopausal bleeding 09/15/2019  . Thrombocytopenia ([place]) 09/15/2019  . Generalized pruritus 09/15/2019   . Benign hypertension with CKD (chronic kidney disease) stage III ([place]) 09/15/2019  . Hyperuricemia 09/15/2019  . Hypokalemia 09/15/2019  . Symptomatic anemia 09/14/2019  . Non compliance w medication regimen 03/24/2015  . Dyslipidemia 03/24/2015  . DM type 2 (diabetes mellitus, type 2) ([place]) 05/23/2014  . HTN (hypertension) 05/23/2014    Orientation RESPIRATION BLADDER Height & Weight     Self,Time,Situation,Place  Normal Continent,External catheter Weight: 253 lb 1.4 oz (114.8 kg) Height:  4\' 11"  (149.9 cm)  BEHAVIORAL SYMPTOMS/MOOD NEUROLOGICAL BOWEL NUTRITION STATUS      Continent Diet (See discharge summary)  AMBULATORY STATUS COMMUNICATION OF NEEDS Skin   Limited Assist Verbally Skin abrasions (Blister r,l, leg)                       Personal Care Assistance Level of Assistance  Bathing,Feeding,Dressing Bathing Assistance: Limited assistance Feeding assistance: Independent Dressing Assistance: Limited assistance     Functional Limitations Info  Sight,Hearing,Speech Sight Info: Impaired Hearing Info: Adequate Speech Info: Adequate    SPECIAL CARE FACTORS FREQUENCY  PT (By licensed PT),OT (By licensed OT)     PT Frequency: 5x per week OT Frequency: 5x per week            Contractures      Additional Factors Info  Allergies,Code Status Code Status Info: Full Allergies Info: Ace Inhibitors, Angiotensin Receptor Blockers           Current Medications (08/09/2020):  This is the current hospital active medication list Current Facility-Administered Medications  Medication Dose Route Frequency Provider Last Rate Last Admin  . acetaminophen (TYLENOL) tablet 650 mg  650 mg Oral Q6H PRN Charlynne Cousins, MD   650 mg at 08/09/20 1306  . aspirin chewable tablet 81 mg  81 mg Oral Daily Irene Pap N, DO   81 mg at 08/09/20 0815  . carvedilol (COREG) tablet 6.25 mg  6.25 mg Oral BID WC Charlynne Cousins, MD   3.125 mg at 08/09/20 1020  . ferrous  sulfate tablet 325 mg  325 mg Oral BID WC Irene Pap N, DO   325 mg at 08/09/20 0816  . furosemide (LASIX) injection 80 mg  80 mg Intravenous BID Charlynne Cousins, MD   80 mg at 08/08/20 1827  . guaiFENesin-dextromethorphan (ROBITUSSIN DM) 100-10 MG/5ML syrup 5 mL  5 mL Oral Q4H PRN Charlynne Cousins, MD   5 mL at 08/09/20 0221  . heparin injection 5,000 Units  5,000 Units Subcutaneous Q8H Irene Pap N, DO   5,000 Units at 08/09/20 1303  . hydrocortisone (ANUSOL-HC) 2.5 % rectal cream 1 application  1 application Rectal J33L PRN Irene Pap N, DO   1 application at 45/62/56 2111  . loratadine (CLARITIN) tablet 10 mg  10 mg Oral Daily PRN Kayleen Memos, DO      . metolazone (ZAROXOLYN) tablet 5 mg  5 mg Oral Daily Charlynne Cousins, MD   5 mg at 08/09/20 0815  . metoprolol tartrate (LOPRESSOR) injection 5 mg  5 mg Intravenous Q6H PRN Irene Pap N, DO   5 mg at 08/09/20 0235  . nutrition supplement (JUVEN) (JUVEN) powder packet 1 packet  1 packet Oral BID BM Irene Pap N, DO   1 packet at 08/09/20 1306  . polyethylene glycol (MIRALAX / GLYCOLAX) packet 17 g  17 g Oral Daily Hall, Carole N, DO      . potassium chloride SA (KLOR-CON) CR tablet 40 mEq  40 mEq Oral BID Charlynne Cousins, MD      . sodium bicarbonate tablet 1,300 mg  1,300 mg Oral BID WC Hall, Carole N, DO   1,300 mg at 08/09/20 3893     Discharge Medications: Please see discharge summary for a list of discharge medications.  Relevant Imaging Results:  Relevant Lab Results:   Additional Information SSN # [id number] patient is vaccinated  Bary Castilla, LCSW

## 2020-08-09 NOTE — Progress Notes (Signed)
[facility] PROGRESS NOTE    Progress Note  Vanessa [name]  PPJ:[id number] DOB: 02-13-1956 DOA: 08/07/2020 PCP: Patient, No Pcp Per     Brief Narrative:   Vanessa [name] is an 65 y.o. female past medical history significant for endometrial carcinoma diagnosed in 2021, COVID-19 viral pneumonia 2021, MSSA bacteremia, essential hypertension, diabetes mellitus type 2, chronic kidney disease stage III, chronic diastolic heart failure, anemia of chronic disease comes into the ED with worsening lower extremity edema up to her abdomen, she relates she ran out of her medications about 2 weeks prior to admission.  Assessment/Plan:   Acute combined systolic and diastolic heart failure: Likely due to noncompliance with her medication, palliative care has been consulted. 2D echo showed an EF of 40%, global hypokinesis with  diastolic heart failure.  Right systolic ventricular function is moderately reduced with moderately elevated pulmonary pressures.,  Inferior cava is dilated with less than 50% respiratory variability. She is not a candidate for cardiac cath due to her advanced renal function. I's and O's are poorly recorded. She is currently on IV Lasix and metolazone. Her regular rate is usually less than 100 kg.  Her weight yesterday was 114 kg Replete electrolytes as needed. Relates her shortness of breath and lower extremity edema improved.  New onset A. fib with RVR: On admission her heart rate was 138 she was noncompliant with her medication she was started on a lower dose of Coreg her heart rate has been ranging around now but 1 15-1 00, will go ahead and increase her Coreg. She not a candidate for anticoagulation at this point due to her history of endometrial cancer bleeding and hemoglobin of 8.8.  Elevated troponin: She denies any chest pain, her cardiac biomarkers have remained basically flat. There is likely demand ischemia in the setting of A. fib with RVR and chronic renal  disease. 2D echo showed no wall motion abnormalities.  Acute kidney injury on chronic kidney disease stage IV: With a baseline creatinine around 2.4.  On admission 3.1. Question due to volume overload versus new baseline due to her noncompliance with her medication. We will get a renal ultrasound.  Refractory hypokalemia Slowly improving continue oral repletion recheck in the morning. Basic metabolic panel is pending.  Bilateral lower extremity edema: Likely due to volume overload.  See acute diastolic heart failure for further details.  Anemia of chronic disease in the setting of chronic kidney stage IV and endometrial vaginal cancer with bleeding: Hemoglobin appears to be at baseline, there is no signs of overt bleeding.  Endometrial cancer: Going through the chart she is not a candidate for hysterectomy. Per chart review goal therapy was palliative in order to control her bleeding. Palliative of care has been consulted.  Severe morbid obesity: Noted counseling.  Deconditioning: Physical therapy has been consulted recommended skilled nursing facility.  Goals of care care/ethics: There was consulted and patient wants to remain a full code although she is considering changing her status depending on her deterioration. Wants to go to skilled nursing facility with palliative care. Patient does not want her spouse to be involved in her care wants her daughter to be her medical decision maker.  Stage II sacral decubitus ulcer present on admission RN Pressure Injury Documentation: Pressure Injury 05/29/20 Thigh Posterior;Proximal;Right Stage 2 -  Partial thickness loss of dermis presenting as a shallow open injury with a red, pink wound bed without slough. 3 small open areas (Active)  05/29/20 2000  Location: Thigh  Location Orientation: Posterior;Proximal;Right  Staging: Stage 2 -  Partial thickness loss of dermis presenting as a shallow open injury with a red, pink wound bed  without slough.  Wound Description (Comments): 3 small open areas  Present on Admission: Yes    Estimated body mass index is 51.12 kg/m as calculated from the following:   Height as of this encounter: 4\' 11"  (1.499 m).   Weight as of this encounter: 114.8 kg.    DVT prophylaxis: lovenox Family Communication:none Status is: Inpatient  Remains inpatient appropriate because:Hemodynamically unstable   Dispo:  Patient From: Home  Planned Disposition: Home with Health Care Svc  Expected discharge date: 08/10/2020  Medically stable for discharge: No          Code Status:     Code Status Orders  (From admission, onward)         Start     Ordered   08/07/20 2307  Full code  Continuous        08/07/20 2306        Code Status History    Date Active Date Inactive Code Status Order ID Comments User Context   05/24/2020 1549 06/09/2020 2229 Full Code [id number]  Vashti Hey, MD ED   04/22/2020 0433 04/30/2020 1940 Full Code [id number]  Etta Quill, DO ED   11/06/2019 1511 11/22/2019 1708 Full Code [id number]  Mckinley Jewel, MD ED   10/18/2019 1529 10/22/2019 0111 Full Code [id number]  Little Ishikawa, MD ED   10/04/2019 2133 10/10/2019 2231 Full Code [id number]  Vianne Bulls, MD ED   09/14/2019 2053 09/26/2019 0059 Full Code [id number]  Schertz, Michele Mcalpine, MD Inpatient   Advance Care Planning Activity        IV Access:    Peripheral IV   Procedures and diagnostic studies:   DG Chest Portable 1 View  Result Date: 08/07/2020 CLINICAL DATA:  Shortness of breath.  Cough. EXAM: PORTABLE CHEST 1 VIEW COMPARISON:  Most recent radiograph 04/22/2020 FINDINGS: Right pleural effusion is increased from prior exam, now with fluid in the right minor fissure. Associated right basilar opacity favors atelectasis. Cardiomegaly is unchanged. No convincing left pleural effusion. Vascular congestion without frank edema. No pneumothorax. Bones are diffusely under  mineralized. Healing or remote proximal left humerus fracture, partially included. IMPRESSION: 1. Increasing right pleural effusion, now with fluid in the right minor fissure. Associated right basilar opacity favors atelectasis. 2. Cardiomegaly with vascular congestion. Electronically Signed   By: Keith Rake M.D.   On: 08/07/2020 18:00   ECHOCARDIOGRAM LIMITED  Result Date: 08/08/2020    ECHOCARDIOGRAM LIMITED REPORT   Patient Name:   Vanessa [name] Date of Exam: 08/08/2020 Medical Rec #:  [id number]       Height:       59.0 in Accession #:    [id number]      Weight:       253.1 lb Date of Birth:  [birth year]       BSA:          2.038 m Patient Age:    30 years        BP:           137/104 mmHg Patient Gender: F               HR:           117 bpm. Exam Location:  Inpatient Procedure: Limited Echo, Limited Color Doppler and Cardiac Doppler Indications:  congestive heart failure  History:        Patient has prior history of Echocardiogram examinations, most                 recent 09/18/2019. Chronic kidney disease,                 Signs/Symptoms:elevated troponin; Risk Factors:Diabetes and                 Hypertension.  Sonographer:    Johny Chess Referring Phys: [id number] [place]  1. Left ventricular ejection fraction, by estimation, is 35 to 40%. The left ventricle has moderately decreased function. The left ventricle demonstrates global hypokinesis. There is mild concentric left ventricular hypertrophy. Indeterminate diastolic filling due to E-A fusion. Elevated left atrial pressure.  2. Right ventricular systolic function is moderately reduced. Mildly increased right ventricular wall thickness. There is moderately elevated pulmonary artery systolic pressure. The estimated right ventricular systolic pressure is 28.3 mmHg.  3. Left atrial size was mildly dilated.  4. The mitral valve is normal in structure. Trivial mitral valve regurgitation.  5. Tricuspid valve regurgitation is  moderate.  6. The aortic valve is normal in structure. Aortic valve regurgitation is mild. No aortic stenosis is present.  7. The inferior vena cava is dilated in size with <50% respiratory variability, suggesting right atrial pressure of 15 mmHg. Comparison(s): Prior images reviewed side by side. The left ventricular function is significantly worse. FINDINGS  Left Ventricle: Left ventricular ejection fraction, by estimation, is 35 to 40%. The left ventricle has moderately decreased function. The left ventricle demonstrates global hypokinesis. There is mild concentric left ventricular hypertrophy. Indeterminate diastolic filling due to E-A fusion. Elevated left atrial pressure. Right Ventricle: Mildly increased right ventricular wall thickness. Right ventricular systolic function is moderately reduced. There is moderately elevated pulmonary artery systolic pressure. The tricuspid regurgitant velocity is 3.26 m/s, and with an assumed right atrial pressure of 15 mmHg, the estimated right ventricular systolic pressure is 66.2 mmHg. Left Atrium: Left atrial size was mildly dilated. Right Atrium: Right atrial size was normal in size. Pericardium: Trivial pericardial effusion is present. Mitral Valve: The mitral valve is normal in structure. Trivial mitral valve regurgitation. Tricuspid Valve: The tricuspid valve is normal in structure. Tricuspid valve regurgitation is moderate. Aortic Valve: The aortic valve is normal in structure. Aortic valve regurgitation is mild. No aortic stenosis is present. Pulmonic Valve: The pulmonic valve was normal in structure. Pulmonic valve regurgitation is mild. Aorta: The aortic root is normal in size and structure. Venous: The inferior vena cava is dilated in size with less than 50% respiratory variability, suggesting right atrial pressure of 15 mmHg. IAS/Shunts: No atrial level shunt detected by color flow Doppler. Additional Comments: There is pleural effusion in the left lateral  region. LEFT VENTRICLE PLAX 2D LVIDd:         3.30 cm LVIDs:         2.60 cm LV PW:         1.20 cm LV IVS:        1.20 cm LVOT diam:     1.90 cm LVOT Area:     2.84 cm  LV Volumes (MOD) LV vol d, MOD A4C: 63.1 ml LV vol s, MOD A4C: 40.3 ml LV SV MOD A4C:     63.1 ml IVC IVC diam: 2.40 cm LEFT ATRIUM         Index LA diam:    3.60 cm 1.77 cm/m  AORTA Ao Root diam: 2.70 cm TRICUSPID VALVE TR Peak grad:   42.5 mmHg TR Vmax:        326.00 cm/s  SHUNTS Systemic Diam: 1.90 cm Dani Gobble Croitoru MD Electronically signed by Sanda Klein MD Signature Date/Time: 08/08/2020/12:55:19 PM    Final      Medical Consultants:    None.  Anti-Infectives:   none  Subjective:    Vanessa [name] relates her breathing is improved.  Objective:    Vitals:   08/08/20 1615 08/08/20 2024 08/09/20 0200 08/09/20 0734  BP: (!) 138/109 (!) 160/103 (!) 129/96 (!) 145/108  Pulse:  (!) 123 (!) 119 (!) 118  Resp:  16  16  Temp:  97.6 F (36.4 C) (!) 97.2 F (36.2 C) (!) 97.4 F (36.3 C)  TempSrc:  Oral Oral Oral  SpO2:  100% 99% 99%  Weight:      Height:       SpO2: 99 %  No intake or output data in the 24 hours ending 08/09/20 0855 Filed Weights   08/07/20 1700 08/08/20 0421  Weight: 117.9 kg 114.8 kg    Exam: General exam: In no acute distress. Respiratory system: Good air movement and clear to auscultation. Cardiovascular system: S1 & S2 heard, RRR.  Positive JVD Gastrointestinal system: Abdomen is nondistended, soft and nontender.  Extremities: 2+ lower extremity edema Skin: No rashes, lesions or ulcers Psychiatry: Judgement and insight appear normal. Mood & affect appropriate.  Data Reviewed:    Labs: Basic Metabolic Panel: Recent Labs  Lab 08/07/20 1736 08/08/20 0044  NA 142 141  K 2.8* 3.2*  CL 102 105  CO2 27 25  GLUCOSE 90 104*  BUN 37* 36*  CREATININE 3.18* 3.20*  CALCIUM 7.7* 7.6*  MG 2.2  --    GFR Estimated Creatinine Clearance: 19.9 mL/min (A) (by C-G formula based  on SCr of 3.2 mg/dL (H)). Liver Function Tests: Recent Labs  Lab 08/07/20 1736  AST 48*  ALT 60*  ALKPHOS 90  BILITOT 0.9  PROT 6.7  ALBUMIN 2.5*   No results for input(s): LIPASE, AMYLASE in the last 168 hours. Recent Labs  Lab 08/07/20 1740  AMMONIA 26   Coagulation profile Recent Labs  Lab 08/07/20 1736  INR 1.3*   COVID-19 Labs  No results for input(s): DDIMER, FERRITIN, LDH, CRP in the last 72 hours.  Lab Results  Component Value Date   SARSCOV2NAA NEGATIVE 08/07/2020   SARSCOV2NAA NEGATIVE 05/24/2020   [place] NEGATIVE 04/29/2020   [place] NEGATIVE 04/22/2020    CBC: Recent Labs  Lab 08/07/20 1736  WBC 6.1  NEUTROABS 5.3  HGB 8.8*  HCT 29.7*  MCV 98.7  PLT 251   Cardiac Enzymes: No results for input(s): CKTOTAL, CKMB, CKMBINDEX, TROPONINI in the last 168 hours. BNP (last 3 results) No results for input(s): PROBNP in the last 8760 hours. CBG: No results for input(s): GLUCAP in the last 168 hours. D-Dimer: No results for input(s): DDIMER in the last 72 hours. Hgb A1c: No results for input(s): HGBA1C in the last 72 hours. Lipid Profile: No results for input(s): CHOL, HDL, LDLCALC, TRIG, CHOLHDL, LDLDIRECT in the last 72 hours. Thyroid function studies: No results for input(s): TSH, T4TOTAL, T3FREE, THYROIDAB in the last 72 hours.  Invalid input(s): FREET3 Anemia work up: No results for input(s): VITAMINB12, FOLATE, FERRITIN, TIBC, IRON, RETICCTPCT in the last 72 hours. Sepsis Labs: Recent Labs  Lab 08/07/20 1736  WBC 6.1   Microbiology Recent Results (from the  past 240 hour(s))  SARS Coronavirus 2 by RT PCR (hospital order, performed in [facility] hospital lab) Nasopharyngeal Nasopharyngeal Swab     Status: None   Collection Time: 08/07/20  6:18 PM   Specimen: Nasopharyngeal Swab  Result Value Ref Range Status   SARS Coronavirus 2 NEGATIVE NEGATIVE Final    Comment: (NOTE) SARS-CoV-2 target nucleic acids are NOT  DETECTED.  The SARS-CoV-2 RNA is generally detectable in upper and lower respiratory specimens during the acute phase of infection. The lowest concentration of SARS-CoV-2 viral copies this assay can detect is 250 copies / mL. A negative result does not preclude SARS-CoV-2 infection and should not be used as the sole basis for treatment or other patient management decisions.  A negative result may occur with improper specimen collection / handling, submission of specimen other than nasopharyngeal swab, presence of viral mutation(s) within the areas targeted by this assay, and inadequate number of viral copies (<250 copies / mL). A negative result must be combined with clinical observations, patient history, and epidemiological information.  Fact Sheet for Patients:   StrictlyIdeas.no  Fact Sheet for Healthcare Providers: BankingDealers.co.za  This test is not yet approved or  cleared by the [place] FDA and has been authorized for detection and/or diagnosis of SARS-CoV-2 by FDA under an Emergency Use Authorization (EUA).  This EUA will remain in effect (meaning this test can be used) for the duration of the COVID-19 declaration under Section 564(b)(1) of the Act, 21 U.S.C. section 360bbb-3(b)(1), unless the authorization is terminated or revoked sooner.  Performed at [facility], [place] [address] [place], [place] [address]      Medications:   . aspirin  81 mg Oral Daily  . carvedilol  3.125 mg Oral BID WC  . ferrous sulfate  325 mg Oral BID WC  . furosemide  80 mg Intravenous BID  . heparin injection (subcutaneous)  5,000 Units Subcutaneous Q8H  . metolazone  5 mg Oral Daily  . nutrition supplement (JUVEN)  1 packet Oral BID BM  . polyethylene glycol  17 g Oral Daily  . potassium chloride  40 mEq Oral BID  . sodium bicarbonate  1,300 mg Oral BID WC   Continuous Infusions:    LOS: 2 days   Charlynne Cousins  [facility]  08/09/2020, 8:55 AM

## 2020-08-10 DIAGNOSIS — I5043 Acute on chronic combined systolic (congestive) and diastolic (congestive) heart failure: Secondary | ICD-10-CM | POA: Diagnosis not present

## 2020-08-10 DIAGNOSIS — J9601 Acute respiratory failure with hypoxia: Secondary | ICD-10-CM | POA: Diagnosis not present

## 2020-08-10 DIAGNOSIS — I5031 Acute diastolic (congestive) heart failure: Secondary | ICD-10-CM | POA: Diagnosis not present

## 2020-08-10 DIAGNOSIS — N171 Acute kidney failure with acute cortical necrosis: Secondary | ICD-10-CM

## 2020-08-10 DIAGNOSIS — N179 Acute kidney failure, unspecified: Secondary | ICD-10-CM

## 2020-08-10 DIAGNOSIS — I483 Typical atrial flutter: Secondary | ICD-10-CM

## 2020-08-10 DIAGNOSIS — N184 Chronic kidney disease, stage 4 (severe): Secondary | ICD-10-CM

## 2020-08-10 DIAGNOSIS — I4891 Unspecified atrial fibrillation: Secondary | ICD-10-CM | POA: Diagnosis not present

## 2020-08-10 LAB — BASIC METABOLIC PANEL
Anion gap: 14 (ref 5–15)
BUN: 51 mg/dL — ABNORMAL HIGH (ref 8–23)
CO2: 24 mmol/L (ref 22–32)
Calcium: 8 mg/dL — ABNORMAL LOW (ref 8.9–10.3)
Chloride: 105 mmol/L (ref 98–111)
Creatinine, Ser: 3.64 mg/dL — ABNORMAL HIGH (ref 0.44–1.00)
GFR, Estimated: 13 mL/min — ABNORMAL LOW (ref >60)
Glucose, Bld: 126 mg/dL — ABNORMAL HIGH (ref 70–99)
Potassium: 4.6 mmol/L (ref 3.5–5.1)
Sodium: 143 mmol/L (ref 135–145)

## 2020-08-10 MED ORDER — DIPHENHYDRAMINE HCL 25 MG PO CAPS
25.0000 mg | ORAL_CAPSULE | Freq: Four times a day (QID) | ORAL | Status: DC | PRN
  Administered 2020-08-10 – 2020-08-18 (×9): 25 mg via ORAL
  Filled 2020-08-10 (×10): qty 1

## 2020-08-10 MED ORDER — CARVEDILOL 25 MG PO TABS
12.5000 mg | ORAL_TABLET | Freq: Two times a day (BID) | ORAL | Status: DC
  Administered 2020-08-10 – 2020-08-11 (×2): 12.5 mg via ORAL
  Filled 2020-08-10 (×2): qty 1

## 2020-08-10 MED ORDER — OXYCODONE-ACETAMINOPHEN 5-325 MG PO TABS
1.0000 | ORAL_TABLET | Freq: Once | ORAL | Status: AC
  Administered 2020-08-10: 1 via ORAL
  Filled 2020-08-10: qty 1

## 2020-08-10 MED ORDER — CARVEDILOL 12.5 MG PO TABS: 12.5000 mg | ORAL_TABLET | Freq: Two times a day (BID) | ORAL | Status: DC

## 2020-08-10 NOTE — Progress Notes (Signed)
[facility] PROGRESS NOTE    Progress Note  Vanessa [name]  YJE:[id number] DOB: [birth year] DOA: 08/07/2020 PCP: Patient, No Pcp Per     Brief Narrative:   Vanessa [name] is an 65 y.o. female past medical history significant for endometrial carcinoma diagnosed in 2021, COVID-19 viral pneumonia 2021, MSSA bacteremia, essential hypertension, diabetes mellitus type 2, chronic kidney disease stage III, chronic diastolic heart failure, anemia of chronic disease comes into the ED with worsening lower extremity edema up to her abdomen, she relates she ran out of her medications about 2 weeks prior to admission.  Assessment/Plan:   Acute combined systolic and diastolic heart failure: Likely due to noncompliance with her medication, palliative care has been consulted. 2D echo showed an EF of 40%, global hypokinesis with  diastolic heart failure.   She is not a candidate for cardiac cath due to her advanced renal function. I's and O's are poorly recorded. She is on IV Lasix and metolazone with poor urine output. She is weighing about 107 kg, her renal ultrasound is done that showed an atrophic left kidney, and her right kidney was unremarkable. She still appears fluid overloaded and her creatinine is trending up with poor diuresis. We will need to consult cardiology.  New onset A. fib with RVR: Continue to increase her Coreg still in A. fib with RVR. She is not a candidate for anticoagulation due to her history of endometrial cancer and high risk of bleeding.  Elevated troponin: She denies any chest pain, her cardiac biomarkers have remained basically flat. There is likely demand ischemia in the setting of A. fib with RVR and chronic renal disease. 2D echo showed no wall motion abnormalities.  Acute kidney injury on chronic kidney disease stage IV: With a baseline creatinine around 2.4.  On admission 3.1. Renal ultrasound showed no hydronephrosis, with an atrophic left kidney and her  right kidney appears unremarkable  Refractory hypokalemia: Slowly improving continue oral repletion recheck in the morning. Basic metabolic panel is pending.  Bilateral lower extremity edema: Likely due to volume overload.  See acute diastolic heart failure for further details.  Anemia of chronic disease in the setting of chronic kidney stage IV and endometrial vaginal cancer with bleeding: Hemoglobin appears to be at baseline, there is no signs of overt bleeding.  Endometrial cancer: Going through the chart she is not a candidate for hysterectomy. Per chart review goal therapy was palliative in order to control her bleeding. Palliative of care has been consulted.  Severe morbid obesity: Noted counseling.  Deconditioning: Physical therapy has been consulted recommended skilled nursing facility.  Goals of care care/ethics: Palliative care was consulted and patient wants to remain a full code although she is considering changing her status depending on her deterioration. Wants to go to skilled nursing facility with palliative care. Patient does not want her spouse to be involved in her care wants her daughter to be her medical decision maker.  Stage II sacral decubitus ulcer present on admission RN Pressure Injury Documentation: Pressure Injury 05/29/20 Thigh Posterior;Proximal;Right Stage 2 -  Partial thickness loss of dermis presenting as a shallow open injury with a red, pink wound bed without slough. 3 small open areas (Active)  05/29/20 2000  Location: Thigh  Location Orientation: Posterior;Proximal;Right  Staging: Stage 2 -  Partial thickness loss of dermis presenting as a shallow open injury with a red, pink wound bed without slough.  Wound Description (Comments): 3 small open areas  Present on Admission: Yes  Estimated body mass index is 45.82 kg/m as calculated from the following:   Height as of this encounter: 4\' 11"  (1.499 m).   Weight as of this encounter: 102.9  kg.    DVT prophylaxis: lovenox Family Communication:none Status is: Inpatient  Remains inpatient appropriate because:Hemodynamically unstable   Dispo:  Patient From: Home  Planned Disposition: Home with Health Care Svc  Expected discharge date: 08/12/2020  Medically stable for discharge: No Code Status:     Code Status Orders  (From admission, onward)         Start     Ordered   08/07/20 2307  Full code  Continuous        08/07/20 2306        Code Status History    Date Active Date Inactive Code Status Order ID Comments User Context   05/24/2020 1549 06/09/2020 2229 Full Code [id number]  Vashti Hey, MD ED   04/22/2020 0433 04/30/2020 1940 Full Code [id number]  Etta Quill, DO ED   11/06/2019 1511 11/22/2019 1708 Full Code [id number]  Mckinley Jewel, MD ED   10/18/2019 1529 10/22/2019 0111 Full Code [id number]  Little Ishikawa, MD ED   10/04/2019 2133 10/10/2019 2231 Full Code [id number]  Vianne Bulls, MD ED   09/14/2019 2053 09/26/2019 0059 Full Code [id number]  Jonnie Finner, Michele Mcalpine, MD Inpatient   Advance Care Planning Activity        IV Access:    Peripheral IV   Procedures and diagnostic studies:   US RENAL  Result Date: 08/09/2020 CLINICAL DATA:  Acute kidney injury EXAM: RENAL / URINARY TRACT ULTRASOUND COMPLETE COMPARISON:  None. FINDINGS: Right Kidney: Renal measurements: 10.2 x 5.4 x 6.8 cm = volume: 186 mL. Echogenicity within normal limits. No suspicious mass or hydronephrosis visualized. Left Kidney: Renal measurements: 6.4 x 2.7 x 4.2 cm = volume: 38 mL. Atrophic. No mass or hydronephrosis visualized. Bladder: Questionable bladder wall thickening. This may be reactive to the adjacent ascites. Other: Large amount of site ease throughout the abdomen. IMPRESSION: 1. RIGHT kidney is unremarkable.  No hydronephrosis. 2. LEFT kidney is atrophic, corresponding to appearance on CT abdomen of 11/07/2019. No hydronephrosis. 3. Questionable bladder  wall thickening. This may be reactive to adjacent ascites. Recommend correlation with urinalysis. 4. Large amount of ascites is seen throughout the abdomen and pelvis. Electronically Signed   By: Franki Cabot M.D.   On: 08/09/2020 14:05     Medical Consultants:    None.  Anti-Infectives:   none  Subjective:    Vanessa [name] she relates her breathing is not better.  Objective:    Vitals:   08/09/20 1926 08/10/20 0114 08/10/20 0600 08/10/20 0846  BP: (!) 155/114 (!) 149/101  (!) 137/114  Pulse: (!) 120     Resp: 16 16  18   Temp: 97.6 F (36.4 C) (!) 97.1 F (36.2 C)  98 F (36.7 C)  TempSrc: Axillary Oral  Oral  SpO2: 98%     Weight:   102.9 kg   Height:       SpO2: 98 %   Intake/Output Summary (Last 24 hours) at 08/10/2020 1011 Last data filed at 08/10/2020 0700 Gross per 24 hour  Intake 720 ml  Output 625 ml  Net 95 ml   Filed Weights   08/07/20 1700 08/08/20 0421 08/10/20 0600  Weight: 117.9 kg 114.8 kg 102.9 kg    Exam: General exam: In no acute distress. Respiratory system: Good air movement  and crackles at bases bilaterally Cardiovascular system: S1 & S2 heard, RRR.  Positive JVD Gastrointestinal system: Abdomen is nondistended, soft and nontender.  Extremities: 3+ edema Skin: No rashes, lesions or ulcers Psychiatry: Judgement and insight appear normal. Mood & affect appropriate.  Data Reviewed:    Labs: Basic Metabolic Panel: Recent Labs  Lab 08/07/20 1736 08/08/20 0044 08/09/20 1021 08/10/20 0241  NA 142 141 141 143  K 2.8* 3.2* 4.2 4.6  CL 102 105 105 105  CO2 27 25 23 24   GLUCOSE 90 104* 113* 126*  BUN 37* 36* 47* 51*  CREATININE 3.18* 3.20* 3.35* 3.64*  CALCIUM 7.7* 7.6* 7.7* 8.0*  MG 2.2  --   --   --    GFR Estimated Creatinine Clearance: 16.3 mL/min (A) (by C-G formula based on SCr of 3.64 mg/dL (H)). Liver Function Tests: Recent Labs  Lab 08/07/20 1736  AST 48*  ALT 60*  ALKPHOS 90  BILITOT 0.9  PROT 6.7  ALBUMIN  2.5*   No results for input(s): LIPASE, AMYLASE in the last 168 hours. Recent Labs  Lab 08/07/20 1740  AMMONIA 26   Coagulation profile Recent Labs  Lab 08/07/20 1736  INR 1.3*   COVID-19 Labs  No results for input(s): DDIMER, FERRITIN, LDH, CRP in the last 72 hours.  Lab Results  Component Value Date   SARSCOV2NAA NEGATIVE 08/07/2020   SARSCOV2NAA NEGATIVE 05/24/2020   [place] NEGATIVE 04/29/2020   [place] NEGATIVE 04/22/2020    CBC: Recent Labs  Lab 08/07/20 1736  WBC 6.1  NEUTROABS 5.3  HGB 8.8*  HCT 29.7*  MCV 98.7  PLT 251   Cardiac Enzymes: No results for input(s): CKTOTAL, CKMB, CKMBINDEX, TROPONINI in the last 168 hours. BNP (last 3 results) No results for input(s): PROBNP in the last 8760 hours. CBG: No results for input(s): GLUCAP in the last 168 hours. D-Dimer: No results for input(s): DDIMER in the last 72 hours. Hgb A1c: No results for input(s): HGBA1C in the last 72 hours. Lipid Profile: No results for input(s): CHOL, HDL, LDLCALC, TRIG, CHOLHDL, LDLDIRECT in the last 72 hours. Thyroid function studies: No results for input(s): TSH, T4TOTAL, T3FREE, THYROIDAB in the last 72 hours.  Invalid input(s): FREET3 Anemia work up: No results for input(s): VITAMINB12, FOLATE, FERRITIN, TIBC, IRON, RETICCTPCT in the last 72 hours. Sepsis Labs: Recent Labs  Lab 08/07/20 1736  WBC 6.1   Microbiology Recent Results (from the past 240 hour(s))  SARS Coronavirus 2 by RT PCR (hospital order, performed in [facility] hospital lab) Nasopharyngeal Nasopharyngeal Swab     Status: None   Collection Time: 08/07/20  6:18 PM   Specimen: Nasopharyngeal Swab  Result Value Ref Range Status   SARS Coronavirus 2 NEGATIVE NEGATIVE Final    Comment: (NOTE) SARS-CoV-2 target nucleic acids are NOT DETECTED.  The SARS-CoV-2 RNA is generally detectable in upper and lower respiratory specimens during the acute phase of infection. The lowest concentration of  SARS-CoV-2 viral copies this assay can detect is 250 copies / mL. A negative result does not preclude SARS-CoV-2 infection and should not be used as the sole basis for treatment or other patient management decisions.  A negative result may occur with improper specimen collection / handling, submission of specimen other than nasopharyngeal swab, presence of viral mutation(s) within the areas targeted by this assay, and inadequate number of viral copies (<250 copies / mL). A negative result must be combined with clinical observations, patient history, and epidemiological information.  Fact  Sheet for Patients:   StrictlyIdeas.no  Fact Sheet for Healthcare Providers: BankingDealers.co.za  This test is not yet approved or  cleared by the [place] FDA and has been authorized for detection and/or diagnosis of SARS-CoV-2 by FDA under an Emergency Use Authorization (EUA).  This EUA will remain in effect (meaning this test can be used) for the duration of the COVID-19 declaration under Section 564(b)(1) of the Act, 21 U.S.C. section 360bbb-3(b)(1), unless the authorization is terminated or revoked sooner.  Performed at [facility], [place] [address], [place], [place] [address]      Medications:   . aspirin  81 mg Oral Daily  . carvedilol  6.25 mg Oral BID WC  . ferrous sulfate  325 mg Oral BID WC  . heparin injection (subcutaneous)  5,000 Units Subcutaneous Q8H  . metolazone  5 mg Oral Daily  . nutrition supplement (JUVEN)  1 packet Oral BID BM  . polyethylene glycol  17 g Oral Daily  . sodium bicarbonate  1,300 mg Oral BID WC   Continuous Infusions:    LOS: 3 days   Charlynne Cousins  [facility]  08/10/2020, 10:11 AM

## 2020-08-10 NOTE — Progress Notes (Signed)
Physical Therapy Treatment Patient Details Name: Vanessa [name] MRN: [id number] DOB: 1955/10/23 Today's Date: 08/10/2020    History of Present Illness 65 y.o. female past medical history significant for endometrial carcinoma diagnosed in 2021, COVID-19 viral pneumonia 2021, MSSA bacteremia, essential hypertension, diabetes mellitus type 2, chronic kidney disease stage III, chronic diastolic heart failure, anemia of chronic disease. Admitted with a. fib with RVR, acute diastolic heart failure, and hypokalemia.    PT Comments    Pt seated on commode on arrival attempting BM, but reported it would not happen.  Performed multiple sit to stands and LE/UE exercises on commode.  Attempted to move patient from commode back to recliner but had to sit on commode at end of session to have more of BM.  Pt left sitting on commode.  Continue to recommend rehab in a post acute setting.      Follow Up Recommendations  SNF     Equipment Recommendations  None recommended by PT    Recommendations for Other Services       Precautions / Restrictions Precautions Precautions: Fall Restrictions Weight Bearing Restrictions: No    Mobility  Bed Mobility               General bed mobility comments: Pt seated on commode on arrival.  Transfers Overall transfer level: Needs assistance Equipment used: Rolling walker (2 wheeled) Transfers: Sit to/from Stand Sit to Stand: Min assist         General transfer comment: Min assistance to weight shift forward and rise into standing.  Pt performed x 2 reps this session.  Ambulation/Gait Ambulation/Gait assistance:  (NT unable to leave the commode as everytime she stood she continued to have BM right after pericare.)               Stairs             Wheelchair Mobility    Modified Rankin (Stroke Patients Only)       Balance Overall balance assessment: Needs assistance Sitting-balance support: No upper extremity  supported Sitting balance-Leahy Scale: Fair       Standing balance-Leahy Scale: Poor                              Cognition Arousal/Alertness: Awake/alert Behavior During Therapy: WFL for tasks assessed/performed Overall Cognitive Status: Within Functional Limits for tasks assessed                                        Exercises General Exercises - Lower Extremity Ankle Circles/Pumps: AROM;Both;10 reps;Seated Long Arc Quad: AROM;Both;10 reps;Seated Hip Flexion/Marching: AROM;Both;10 reps;Seated Shoulder Exercises Shoulder Flexion: 10 reps;Both;Seated;AROM;AAROM Shoulder Extension: 10 reps;Both;Seated;AROM;AAROM Elbow Flexion: AROM;Both;10 reps;Seated Elbow Extension: AROM;Both;10 reps;Seated Digit Composite Flexion: AROM;Both;10 reps;Seated    General Comments        Pertinent Vitals/Pain      Home Living                      Prior Function            PT Goals (current goals can now be found in the care plan section) Acute Rehab PT Goals Patient Stated Goal: Get her strength back Potential to Achieve Goals: Good Progress towards PT goals: Progressing toward goals    Frequency    Min 3X/week  PT Plan Current plan remains appropriate    Co-evaluation              AM-PAC PT "6 Clicks" Mobility   Outcome Measure  Help needed turning from your back to your side while in a flat bed without using bedrails?: A Little Help needed moving from lying on your back to sitting on the side of a flat bed without using bedrails?: A Little Help needed moving to and from a bed to a chair (including a wheelchair)?: A Lot Help needed standing up from a chair using your arms (e.g., wheelchair or bedside chair)?: A Lot Help needed to walk in hospital room?: A Lot Help needed climbing 3-5 steps with a railing? : Total 6 Click Score: 13    End of Session Equipment Utilized During Treatment: Gait belt Activity Tolerance:  Patient limited by fatigue;Patient limited by lethargy Patient left: with call bell/phone within reach (seated on commode with call bell in reach.) Nurse Communication: Mobility status (need to continue sitting on commode.) PT Visit Diagnosis: Unsteadiness on feet (R26.81);Muscle weakness (generalized) (M62.81);Difficulty in walking, not elsewhere classified (R26.2)     Time: 5015-8682 PT Time Calculation (min) (ACUTE ONLY): 20 min  Charges:  $Therapeutic Activity: 8-22 mins                     Erasmo Leventhal , PTA Acute Rehabilitation Services Pager [phone] Office [phone]     [name] [name] 08/10/2020, 3:58 PM

## 2020-08-10 NOTE — Progress Notes (Signed)
Pharmacist Heart Failure Core Measure Documentation  Assessment: Vanessa [name] has an EF documented as 35-40% on ECHO by 08/08/2020.  Rationale: Heart failure patients with left ventricular systolic dysfunction (LVSD) and an EF < 40% should be prescribed an angiotensin converting enzyme inhibitor (ACEI) or angiotensin receptor blocker (ARB) at discharge unless a contraindication is documented in the medical record.  This patient is not currently on an ACEI or ARB for HF.  This note is being placed in the record in order to provide documentation that a contraindication to the use of these agents is present for this encounter.  ACE Inhibitor or Angiotensin Receptor Blocker is contraindicated (specify all that apply)  []   ACEI allergy AND ARB allergy []   Angioedema []   Moderate or severe aortic stenosis []   Hyperkalemia []   Hypotension []   Renal artery stenosis [x]   Worsening renal function, preexisting renal disease or dysfunction   Antonietta Jewel, PharmD, BCCCP Clinical Pharmacist  Phone: [phone] 08/10/2020 3:18 PM  Please check AMION for all [place] phone numbers After 10:00 PM, call [place] [phone]

## 2020-08-10 NOTE — Plan of Care (Signed)
  Problem: Education: Goal: Knowledge of General Education information will improve Description Including pain rating scale, medication(s)/side effects and non-pharmacologic comfort measures Outcome: Progressing   

## 2020-08-10 NOTE — Consult Note (Addendum)
Cardiology Consultation:   Patient ID: Vanessa [name]; [id number]; 11-25-1955   Admit date: 08/07/2020 Date of Consult: 08/10/2020  Primary Care Provider: Patient, No Pcp Per Primary Cardiologist: New to [facility]  Patient Profile:   Vanessa [name] is a 65 y.o. female with a hx of endometrial cancer dx 2021, MSSA bacteremia, HTN, HLD, esophageal immobility, DM2, CKD Stage III, asthma and anemia of chronic disease who is being seen today for the evaluation of acute systolic CHF at the request of Dr. Olevia Bowens.  History of Present Illness:   Vanessa [name] is a 65yo M with a hx as stated above who presented to [facility] 08/07/20 with worsening LE edema which extended to her abdomen on hospital presentation with associated SOB. HPI fairly difficult to obtain as she is a poor historian of events leading to her hospitalization with no family at bedside. She reports that her symptoms began after a hospitalization for endometrial cancer at which time she was started on chemotherapy treatment. She reports that she was initially discharged to her home although at drop off, she was found to have been evicted from her home. She therefore has been staying with her daughter. She states that she has been feeling some intermittent fluttering and racing heart rate but no complaints of chest pain. She has had orthopnea, PND but no presyncopal or syncopal episodes. She reports that her SOB has been with minimal exertion and has recently progressed to at rest. She is relatively sedentary at baseline. She reports that she has been out of her medications for the last 2 weeks.   In the ED, she was found to be extremely fluid volume overloaded on exam. BNP found to be 2227 with CXR consistent with vascular congestion with increased right pleural effusion and right basilar opacity. Cr was found to be elevated at 3.64 with a baseline at 2.0. She was started on IV diuretics. EKG showed  HsT found to be 47>>>40. Flat trend not consistent  with ACS. Echocardiogram 08/08/20 showed reduced LV function at 35-40% with global hypokinesis, mild concentric LVH and indeterminate filling pressures with moderately elevated PA pressures at 57.40mmHg and mod TR. Prior echo from 09/2019 with low normal LVEF at 55-60% no RWMA, G2DD, and similarly elevated PA pressures. She was treated with IV diuretics and metolazone with poor UO and rising creatinine. Renal US performed which showed an atrophic left kidney. There was no hydronephrosis.    [facility] was initially involved with her care earlier this year for the evaluation of bacteremia with TEE. Unfortunately, MD unable to pass probe for full assessment and procedure was aborted.   Past Medical History:  Diagnosis Date  . Anemia 10/09/2019  . Asthma   . Cellulitis and abscess of lower extremity 04/22/2020  . CKD (chronic kidney disease) stage 3, GFR 30-59 ml/min (HCC)   . Dental disease   . Diabetes mellitus without complication ([place])   . Endometrial cancer ([place])   . Esophageal dysmotility   . Heart murmur   . HLD (hyperlipidemia)   . Hypertension   . MSSA bacteremia 09/2019  . Obesity, Class III, BMI 40-49.9 (morbid obesity) ([place])   . Pressure ulcer   . Vaginal bleeding 05/2020    Past Surgical History:  Procedure Laterality Date  . BIOPSY  10/08/2019   Procedure: BIOPSY;  Surgeon: Clarene Essex, MD;  Location: [facility] ENDOSCOPY;  Service: Endoscopy;;  . ESOPHAGOGASTRODUODENOSCOPY N/A 10/08/2019   Procedure: ESOPHAGOGASTRODUODENOSCOPY (EGD);  Surgeon: Clarene Essex, MD;  Location: [facility]  ENDOSCOPY;  Service: Endoscopy;  Laterality: N/A;  . TYMPANOSTOMY TUBE PLACEMENT Bilateral      Prior to Admission medications   Medication Sig Start Date End Date Taking? Authorizing Provider  acetaminophen (TYLENOL) 500 MG tablet Take 500 mg by mouth every 6 (six) hours as needed (pain).    Yes [provider]  amLODipine (NORVASC) 10 MG tablet Take 1 tablet (10 mg total) by mouth daily. 04/29/20  Yes  Swayze, Ava, DO  carvedilol (COREG) 6.25 MG tablet Take 1 tablet (6.25 mg total) by mouth 2 (two) times daily with a meal. 12/13/19  Yes Medina-Vargas, Monina C, NP  cloNIDine (CATAPRES) 0.2 MG tablet Take 1 tablet (0.2 mg total) by mouth 2 (two) times daily. Patient taking differently: Take 0.2 mg by mouth 2 (two) times daily with a meal. 12/13/19  Yes Medina-Vargas, Monina C, NP  diphenhydrAMINE (BENADRYL) 25 MG tablet Take 25 mg by mouth daily as needed for itching or allergies.   Yes [provider]  ferrous sulfate 325 (65 FE) MG tablet Take 1 tablet (325 mg total) by mouth 2 (two) times daily with a meal. 12/13/19  Yes Medina-Vargas, Monina C, NP  hydrALAZINE (APRESOLINE) 100 MG tablet Take 1 tablet (100 mg total) by mouth every 8 (eight) hours. 04/30/20  Yes Antonieta Pert, MD  loratadine (CLARITIN) 10 MG tablet Take 1 tablet (10 mg total) by mouth daily as needed for allergies. 12/13/19  Yes Medina-Vargas, Monina C, NP  metolazone (ZAROXOLYN) 5 MG tablet Take 5 mg by mouth daily.   Yes [provider]  nutrition supplement, JUVEN, (JUVEN) PACK Take 1 packet by mouth 2 (two) times daily between meals. 04/29/20  Yes Swayze, Ava, DO  polyethylene glycol (MIRALAX / GLYCOLAX) 17 g packet Take 17 g by mouth daily. Patient taking differently: Take 17 g by mouth daily as needed for mild constipation. 09/25/19  Yes Arrien, Jimmy Picket, MD  sodium bicarbonate 650 MG tablet Take 2 tablets (1,300 mg total) by mouth 2 (two) times daily. Patient taking differently: Take 1,300 mg by mouth 2 (two) times daily with a meal. 04/30/20  Yes Kc, Ramesh, MD  Amino Acids-Protein Hydrolys (FEEDING SUPPLEMENT, PRO-STAT SUGAR FREE 64,) LIQD Take 30 mLs by mouth 3 (three) times daily with meals. Patient not taking: No sig reported 11/21/19   Jonetta Osgood, MD  hydrocerin (EUCERIN) CREA Apply 1 application topically daily. Patient not taking: No sig reported 04/29/20   Swayze, Ava, DO    Inpatient  Medications: Scheduled Meds: . aspirin  81 mg Oral Daily  . carvedilol  12.5 mg Oral BID WC  . ferrous sulfate  325 mg Oral BID WC  . heparin injection (subcutaneous)  5,000 Units Subcutaneous Q8H  . nutrition supplement (JUVEN)  1 packet Oral BID BM  . polyethylene glycol  17 g Oral Daily  . sodium bicarbonate  1,300 mg Oral BID WC   Continuous Infusions:  PRN Meds: acetaminophen, diphenhydrAMINE, guaiFENesin-dextromethorphan, hydrocortisone, loratadine, metoprolol tartrate  Allergies:    Allergies  Allergen Reactions  . Ace Inhibitors Other (See Comments)    Never prescribed but she has had angioedema  . Angiotensin Receptor Blockers Other (See Comments)    ARB never Rxed but PMH  of angioedema in March 2021    Social History:   Social History   Socioeconomic History  . Marital status: Married    Spouse name: Not on file  . Number of children: Not on file  . Years of education: Not  on file  . Highest education level: Not on file  Occupational History  . Not on file  Tobacco Use  . Smoking status: Former Research scientist (life sciences)  . Smokeless tobacco: Never Used  . Tobacco comment: " years ago ", >109yrs  Vaping Use  . Vaping Use: Never used  Substance and Sexual Activity  . Alcohol use: Yes    Comment: occasional  . Drug use: No  . Sexual activity: Not Currently  Other Topics Concern  . Not on file  Social History Narrative   Lives with her daughter and her 4 children.    Social Determinants of Health   Financial Resource Strain: Not on file  Food Insecurity: Not on file  Transportation Needs: Not on file  Physical Activity: Not on file  Stress: Not on file  Social Connections: Not on file  Intimate Partner Violence: Not on file    Family History:   Family History  Problem Relation Age of Onset  . Stroke Mother   . Diabetes Mother   . Hypertension Mother   . Cancer Mother        possible ovarian  . Diabetes Brother   . Hypertension Brother   . Cancer Maternal  Grandmother        possible ovarian   Family Status:  Family Status  Relation Name Status  . Mother  Deceased  . Father  Deceased  . Brother  (Not Specified)  . MGM  (Not Specified)    ROS:  Please see the history of present illness.  All other ROS reviewed and negative.     Physical Exam/Data:   Vitals:   08/09/20 1926 08/10/20 0114 08/10/20 0600 08/10/20 0846  BP: (!) 155/114 (!) 149/101  (!) 137/114  Pulse: (!) 120     Resp: 16 16  18   Temp: 97.6 F (36.4 C) (!) 97.1 F (36.2 C)  98 F (36.7 C)  TempSrc: Axillary Oral  Oral  SpO2: 98%     Weight:   102.9 kg   Height:        Intake/Output Summary (Last 24 hours) at 08/10/2020 1057 Last data filed at 08/10/2020 0700 Gross per 24 hour  Intake 720 ml  Output 625 ml  Net 95 ml   Filed Weights   08/07/20 1700 08/08/20 0421 08/10/20 0600  Weight: 117.9 kg 114.8 kg 102.9 kg   Body mass index is 45.82 kg/m.   General: Obese, NAD Neck: Negative for carotid bruits. No JVD Lungs: Diminished in bilateral upper and lower lobes. Breathing is unlabored. Cardiovascular: Regularly irregular. No murmurs Abdomen: Soft, non-tender, non-distended. No obvious abdominal masses. Extremities: 3+ BLE to above the knee edema. Radial pulses 2+ bilaterally Neuro: Alert and oriented. No focal deficits. No facial asymmetry. MAE spontaneously. Psych: Responds to questions appropriately with normal affect.     EKG:  The EKG was personally reviewed and demonstrates:  08/07/20 Atrial flutter with HR 134bpm, no acute changes  Telemetry:  Telemetry was personally reviewed and demonstrates: 08/10/20 Atrial flutter with rates in the 120 range   Relevant CV Studies:  Echocardiogram 08/08/20:  1. Left ventricular ejection fraction, by estimation, is 35 to 40%. The  left ventricle has moderately decreased function. The left ventricle  demonstrates global hypokinesis. There is mild concentric left ventricular  hypertrophy. Indeterminate  diastolic  filling due to E-A fusion. Elevated left atrial pressure.  2. Right ventricular systolic function is moderately reduced. Mildly  increased right ventricular wall thickness. There is moderately elevated  pulmonary artery systolic pressure. The estimated right ventricular  systolic pressure is 29.5 mmHg.  3. Left atrial size was mildly dilated.  4. The mitral valve is normal in structure. Trivial mitral valve  regurgitation.  5. Tricuspid valve regurgitation is moderate.  6. The aortic valve is normal in structure. Aortic valve regurgitation is  mild. No aortic stenosis is present.  7. The inferior vena cava is dilated in size with <50% respiratory  variability, suggesting right atrial pressure of 15 mmHg.   Echocardiogram 09/18/2019:  1. Left ventricular ejection fraction, by estimation, is 55 to 60%. The  left ventricle has normal function. The left ventricle has no regional  wall motion abnormalities. Left ventricular diastolic parameters are  consistent with Grade II diastolic  dysfunction (pseudonormalization). Elevated left ventricular end-diastolic  pressure.  2. Right ventricular systolic function is normal. The right ventricular  size is normal. There is moderately elevated pulmonary artery systolic  pressure. The estimated right ventricular systolic pressure is 28.4 mmHg.  3. Left atrial size was moderately dilated.  4. The mitral valve is abnormal. Mild to moderate mitral valve  regurgitation.  5. The aortic valve is tricuspid. Aortic valve regurgitation is trivial.  Mild aortic valve sclerosis is present, with no evidence of aortic valve  stenosis.  6. The inferior vena cava is dilated in size with <50% respiratory  variability, suggesting right atrial pressure of 15 mmHg.   Laboratory Data:  Chemistry Recent Labs  Lab 08/08/20 0044 08-29-20 1021 08/10/20 0241  NA 141 141 143  K 3.2* 4.2 4.6  CL 105 105 105  CO2 25 23 24   GLUCOSE 104*  113* 126*  BUN 36* 47* 51*  CREATININE 3.20* 3.35* 3.64*  CALCIUM 7.6* 7.7* 8.0*  GFRNONAA 15* 15* 13*  ANIONGAP 11 13 14     Total Protein  Date Value Ref Range Status  08/07/2020 6.7 6.5 - 8.1 g/dL Final   Albumin  Date Value Ref Range Status  08/07/2020 2.5 (L) 3.5 - 5.0 g/dL Final   AST  Date Value Ref Range Status  08/07/2020 48 (H) 15 - 41 U/L Final   ALT  Date Value Ref Range Status  08/07/2020 60 (H) 0 - 44 U/L Final   Alkaline Phosphatase  Date Value Ref Range Status  08/07/2020 90 38 - 126 U/L Final   Total Bilirubin  Date Value Ref Range Status  08/07/2020 0.9 0.3 - 1.2 mg/dL Final   Hematology Recent Labs  Lab 08/07/20 1736  WBC 6.1  RBC 3.01*  HGB 8.8*  HCT 29.7*  MCV 98.7  MCH 29.2  MCHC 29.6*  RDW 17.7*  PLT 251   Cardiac EnzymesNo results for input(s): TROPONINI in the last 168 hours. No results for input(s): TROPIPOC in the last 168 hours.  BNP Recent Labs  Lab 08/07/20 1736  BNP 2,227.0*    DDimer No results for input(s): DDIMER in the last 168 hours. TSH:  Lab Results  Component Value Date   TSH 4.135 11/13/2019   Lipids: Lab Results  Component Value Date   CHOL 153 2015-08-30   HDL 61 Aug 30, 2015   LDLCALC 76 08-30-2015   TRIG 82 08/30/2015   CHOLHDL 2.5 Aug 30, 2015   HgbA1c: Lab Results  Component Value Date   HGBA1C 5.9 (H) 04/22/2020    Radiology/Studies:  US RENAL  Result Date: 29-Aug-2020 CLINICAL DATA:  Acute kidney injury EXAM: RENAL / URINARY TRACT ULTRASOUND COMPLETE COMPARISON:  None. FINDINGS: Right Kidney: Renal measurements: 10.2 x 5.4 x  6.8 cm = volume: 186 mL. Echogenicity within normal limits. No suspicious mass or hydronephrosis visualized. Left Kidney: Renal measurements: 6.4 x 2.7 x 4.2 cm = volume: 38 mL. Atrophic. No mass or hydronephrosis visualized. Bladder: Questionable bladder wall thickening. This may be reactive to the adjacent ascites. Other: Large amount of site ease throughout the abdomen.  IMPRESSION: 1. RIGHT kidney is unremarkable.  No hydronephrosis. 2. LEFT kidney is atrophic, corresponding to appearance on CT abdomen of 11/07/2019. No hydronephrosis. 3. Questionable bladder wall thickening. This may be reactive to adjacent ascites. Recommend correlation with urinalysis. 4. Large amount of ascites is seen throughout the abdomen and pelvis. Electronically Signed   By: Franki Cabot M.D.   On: 08/09/2020 14:05   DG Chest Portable 1 View  Result Date: 08/07/2020 CLINICAL DATA:  Shortness of breath.  Cough. EXAM: PORTABLE CHEST 1 VIEW COMPARISON:  Most recent radiograph 04/22/2020 FINDINGS: Right pleural effusion is increased from prior exam, now with fluid in the right minor fissure. Associated right basilar opacity favors atelectasis. Cardiomegaly is unchanged. No convincing left pleural effusion. Vascular congestion without frank edema. No pneumothorax. Bones are diffusely under mineralized. Healing or remote proximal left humerus fracture, partially included. IMPRESSION: 1. Increasing right pleural effusion, now with fluid in the right minor fissure. Associated right basilar opacity favors atelectasis. 2. Cardiomegaly with vascular congestion. Electronically Signed   By: Keith Rake M.D.   On: 08/07/2020 18:00   ECHOCARDIOGRAM LIMITED  Result Date: 08/08/2020    ECHOCARDIOGRAM LIMITED REPORT   Patient Name:   SYD MANGES Gawthrop Date of Exam: 08/08/2020 Medical Rec #:  [id number]       Height:       59.0 in Accession #:    [id number]      Weight:       253.1 lb Date of Birth:  1955-12-16       BSA:          2.038 m Patient Age:    79 years        BP:           137/104 mmHg Patient Gender: F               HR:           117 bpm. Exam Location:  Inpatient Procedure: Limited Echo, Limited Color Doppler and Cardiac Doppler Indications:    congestive heart failure  History:        Patient has prior history of Echocardiogram examinations, most                 recent 09/18/2019. Chronic kidney  disease,                 Signs/Symptoms:elevated troponin; Risk Factors:Diabetes and                 Hypertension.  Sonographer:    Johny Chess Referring Phys: [id number] [place]  1. Left ventricular ejection fraction, by estimation, is 35 to 40%. The left ventricle has moderately decreased function. The left ventricle demonstrates global hypokinesis. There is mild concentric left ventricular hypertrophy. Indeterminate diastolic filling due to E-A fusion. Elevated left atrial pressure.  2. Right ventricular systolic function is moderately reduced. Mildly increased right ventricular wall thickness. There is moderately elevated pulmonary artery systolic pressure. The estimated right ventricular systolic pressure is 63.7 mmHg.  3. Left atrial size was mildly dilated.  4. The mitral valve is normal in structure. Trivial mitral valve regurgitation.  5. Tricuspid valve regurgitation is moderate.  6. The aortic valve is normal in structure. Aortic valve regurgitation is mild. No aortic stenosis is present.  7. The inferior vena cava is dilated in size with <50% respiratory variability, suggesting right atrial pressure of 15 mmHg. Comparison(s): Prior images reviewed side by side. The left ventricular function is significantly worse. FINDINGS  Left Ventricle: Left ventricular ejection fraction, by estimation, is 35 to 40%. The left ventricle has moderately decreased function. The left ventricle demonstrates global hypokinesis. There is mild concentric left ventricular hypertrophy. Indeterminate diastolic filling due to E-A fusion. Elevated left atrial pressure. Right Ventricle: Mildly increased right ventricular wall thickness. Right ventricular systolic function is moderately reduced. There is moderately elevated pulmonary artery systolic pressure. The tricuspid regurgitant velocity is 3.26 m/s, and with an assumed right atrial pressure of 15 mmHg, the estimated right ventricular systolic pressure is  49.4 mmHg. Left Atrium: Left atrial size was mildly dilated. Right Atrium: Right atrial size was normal in size. Pericardium: Trivial pericardial effusion is present. Mitral Valve: The mitral valve is normal in structure. Trivial mitral valve regurgitation. Tricuspid Valve: The tricuspid valve is normal in structure. Tricuspid valve regurgitation is moderate. Aortic Valve: The aortic valve is normal in structure. Aortic valve regurgitation is mild. No aortic stenosis is present. Pulmonic Valve: The pulmonic valve was normal in structure. Pulmonic valve regurgitation is mild. Aorta: The aortic root is normal in size and structure. Venous: The inferior vena cava is dilated in size with less than 50% respiratory variability, suggesting right atrial pressure of 15 mmHg. IAS/Shunts: No atrial level shunt detected by color flow Doppler. Additional Comments: There is pleural effusion in the left lateral region. LEFT VENTRICLE PLAX 2D LVIDd:         3.30 cm LVIDs:         2.60 cm LV PW:         1.20 cm LV IVS:        1.20 cm LVOT diam:     1.90 cm LVOT Area:     2.84 cm  LV Volumes (MOD) LV vol d, MOD A4C: 63.1 ml LV vol s, MOD A4C: 40.3 ml LV SV MOD A4C:     63.1 ml IVC IVC diam: 2.40 cm LEFT ATRIUM         Index LA diam:    3.60 cm 1.77 cm/m   AORTA Ao Root diam: 2.70 cm TRICUSPID VALVE TR Peak grad:   42.5 mmHg TR Vmax:        326.00 cm/s  SHUNTS Systemic Diam: 1.90 cm Dani Gobble Croitoru MD Electronically signed by Sanda Klein MD Signature Date/Time: 08/08/2020/12:55:19 PM    Final    Assessment and Plan:   1. Acute on chronic combined diastolic and systolic heart failure: -Pt presented with worsening SOB with LE edema found to be fluid volume overloaded on exam  -BNP found to be 2227 with CXR consistent with vascular congestion with increased right pleural effusion and right basilar opacity.  -Cr was found to be elevated at 3.64 with a baseline at 2.0. She was started on IV diuretics. EKG showed atrial flutter  with RVR at 134bpm (new) -HsT found to be 47>>>40. Flat trend not consistent with ACS.  -Echocardiogram 08/08/20 showed reduced LV function at 35-40% with global hypokinesis, mild concentric LVH and indeterminate filling pressures with moderately elevated PA pressures at 57.81mmHg and mod TR. Prior echo from 09/2019 with low normal LVEF at 55-60% no RWMA, G2DD, and similarly  elevated PA pressures.  -She was treated with IV diuretics and metolazone with poor UO and rising creatinine. Renal US performed which showed an atrophic left kidney. There was no hydronephrosis.   -Initially she was placed on IV Lasix 80mg  BID with last dose 08/08/20>>add IV Lasix 80mg  BID back to regimen and follow response closely -Weight, 226lb today with an admission weight at 260lb??  -I&O, net positive 22ml since admission   2. New onset atrial flutter: -EKG likely represents atrial flutter with RVR at 134bpm, new onset  -Treated with carvedilol however rates are not optimal at this time>>up titrate to 25mg  BID  -Not on anticoagulation due to endometrial bleeding secondary to endometrial cancer -Not a candidate for DCCV due to the absence of AC -Not a candidate of digoxin due to poor renal function   3. Acute on chronic kidney disease stage III: -Baseline Cr appears to be in the 2.0 range however Cr on admission elevated at 3.18 which unfortunately has up trended and is now 3.64 -Renal US showed an atrophic left kidney with normal appearing right  -UO has been poor despite IV Lasix -Would consider renal consult at this time  -Received last dose of Lasix 08/08/20 therefore will attempt IV Lasix 80mg  BID and follow creatinine closely with renal   4. Elevated troponin: -HsT, 47>>40 -Likely demand ischemia in the setting of HF and significant fluid volume overload  -No c/o chest pain   5. Endometrial cancer: -Non-operative with goal to control bleeding and remain comfortable -Palliative care consulted by IM   6.  DM2: -HbA1c, 8.4 -SSI for glucose control while inpatient status  -Management per IM   7. HTN: -Elevated, 155/120>>137/114>>149/101 -If BP remains elevated could consider the addition of hydralazine however will hold off for now given the need to diurese    For questions or updates, please contact [place] Please consult [email or web address] for contact info under Cardiology/STEMI.   SignedKathyrn Drown NP-C [facility] Pager: [phone] 08/10/2020 10:57 AM  The patient was seen, examined and discussed with Kathyrn Drown, NP  and I agree with the above.   64 y.o. female with a hx of endometrial cancer dx 2021, MSSA bacteremia, HTN, HLD, esophageal immobility, DM2, CKD Stage III, asthma and anemia of chronic disease who is being seen today for the evaluation of acute systolic CHF at the request of Dr. Olevia Bowens.  She was admitted to [facility] 08/07/20 with anasarca, she described her lower extremity edema started over a month ago.  She reports that her symptoms began after a hospitalization for endometrial cancer at which time she was started on chemotherapy treatment.  She developed progressively worsening dyspnea on exertion and eventually shortness of breath at rest. Primary team attempted to diurese with Lasix 80 mg IV twice daily that she received 2 doses on 08/08/2020 however it was discontinued because of worsening creatinine.  To 2.4, currently 3.2 on 08/08/2020 and today 3.64 on 08/10/2020. Her troponin was minimally elevated consistent with CHF and acute on chronic kidney failure.  BNP elevated at 2227. On physical exam she has elevated JVDs even when sitting upright, she has regular rate and rhythm and is tachycardic, she has Rales in both lungs up to mid lungs.  And severe lower extremity edema, compression socks are applied.  Echocardiogram 08/08/20 showed reduced LV function at 35-40% with global hypokinesis, mild concentric LVH and indeterminate filling pressures with moderately elevated PA  pressures at 57.77mmHg and mod TR. Prior echo from 09/2019 with low normal LVEF  at 55-60% no RWMA, G2DD, and similarly elevated PA pressures. EKG shows new atrial flutter with rapid ventricular response with ventricular rates in 130s. She is being followed by palliative care and is going to be discharged to skilled nursing facility with home palliative care.  Assessment and plan: Acute on chronic combined systolic diastolic CHF with anasarca Hypertensive heart disease with CHF Acute on chronic kidney failure CKD stage IV Atrial flutter with RVR Endometrial cancer  The patient needs to be aggressively diuresed despite worsening kidney function, I would start Lasix 80 mg IV twice daily and also ask nephrology for further advice.  At this point the patient is not a candidate for any inotropic support such as milrinone, we will focus on comfort care.  Will uptitrate carvedilol to 25 mg p.o. twice daily, this will work for both hypertension and rate control.  We cannot cardiovert her as she is not a candidate for anticoagulation given endometrial cancer and bleeding and anemia. If needed we can later add Imdur and hydralazine for CHF and hypertension.  Ena Dawley, MD 08/10/2020

## 2020-08-11 DIAGNOSIS — N179 Acute kidney failure, unspecified: Secondary | ICD-10-CM | POA: Diagnosis not present

## 2020-08-11 DIAGNOSIS — J9601 Acute respiratory failure with hypoxia: Secondary | ICD-10-CM | POA: Diagnosis not present

## 2020-08-11 DIAGNOSIS — I4891 Unspecified atrial fibrillation: Secondary | ICD-10-CM | POA: Diagnosis not present

## 2020-08-11 DIAGNOSIS — N171 Acute kidney failure with acute cortical necrosis: Secondary | ICD-10-CM | POA: Diagnosis not present

## 2020-08-11 DIAGNOSIS — I130 Hypertensive heart and chronic kidney disease with heart failure and stage 1 through stage 4 chronic kidney disease, or unspecified chronic kidney disease: Principal | ICD-10-CM

## 2020-08-11 DIAGNOSIS — I5043 Acute on chronic combined systolic (congestive) and diastolic (congestive) heart failure: Secondary | ICD-10-CM | POA: Diagnosis not present

## 2020-08-11 DIAGNOSIS — I5031 Acute diastolic (congestive) heart failure: Secondary | ICD-10-CM | POA: Diagnosis not present

## 2020-08-11 LAB — BASIC METABOLIC PANEL
Anion gap: 14 (ref 5–15)
BUN: 61 mg/dL — ABNORMAL HIGH (ref 8–23)
CO2: 24 mmol/L (ref 22–32)
Calcium: 8 mg/dL — ABNORMAL LOW (ref 8.9–10.3)
Chloride: 105 mmol/L (ref 98–111)
Creatinine, Ser: 3.81 mg/dL — ABNORMAL HIGH (ref 0.44–1.00)
GFR, Estimated: 13 mL/min — ABNORMAL LOW (ref >60)
Glucose, Bld: 92 mg/dL (ref 70–99)
Potassium: 4.6 mmol/L (ref 3.5–5.1)
Sodium: 143 mmol/L (ref 135–145)

## 2020-08-11 LAB — PATHOLOGIST SMEAR REVIEW

## 2020-08-11 MED ORDER — HYDRALAZINE HCL 25 MG PO TABS
25.0000 mg | ORAL_TABLET | Freq: Three times a day (TID) | ORAL | Status: DC
  Administered 2020-08-11 – 2020-08-13 (×6): 25 mg via ORAL
  Filled 2020-08-11 (×6): qty 1

## 2020-08-11 MED ORDER — METOLAZONE 5 MG PO TABS
5.0000 mg | ORAL_TABLET | Freq: Every day | ORAL | Status: DC
  Administered 2020-08-11 – 2020-08-14 (×4): 5 mg via ORAL
  Filled 2020-08-11 (×3): qty 1

## 2020-08-11 MED ORDER — FUROSEMIDE 10 MG/ML IJ SOLN
80.0000 mg | Freq: Once | INTRAMUSCULAR | Status: AC
  Administered 2020-08-11: 80 mg via INTRAVENOUS
  Filled 2020-08-11: qty 8

## 2020-08-11 MED ORDER — MELATONIN 5 MG PO TABS
10.0000 mg | ORAL_TABLET | Freq: Every evening | ORAL | Status: DC | PRN
  Administered 2020-08-11: 10 mg via ORAL
  Filled 2020-08-11: qty 2

## 2020-08-11 MED ORDER — CARVEDILOL 25 MG PO TABS: 25.0000 mg | ORAL_TABLET | Freq: Two times a day (BID) | ORAL | Status: DC

## 2020-08-11 MED ORDER — FUROSEMIDE 10 MG/ML IJ SOLN
80.0000 mg | Freq: Two times a day (BID) | INTRAMUSCULAR | Status: DC
  Administered 2020-08-11 – 2020-08-13 (×4): 80 mg via INTRAVENOUS
  Filled 2020-08-11 (×4): qty 8

## 2020-08-11 MED ORDER — CARVEDILOL 12.5 MG PO TABS
12.5000 mg | ORAL_TABLET | Freq: Once | ORAL | Status: AC
  Administered 2020-08-11: 12.5 mg via ORAL
  Filled 2020-08-11: qty 1

## 2020-08-11 MED ORDER — CARVEDILOL 25 MG PO TABS
25.0000 mg | ORAL_TABLET | Freq: Two times a day (BID) | ORAL | Status: DC
  Administered 2020-08-11 – 2020-08-18 (×13): 25 mg via ORAL
  Filled 2020-08-11 (×14): qty 1

## 2020-08-11 MED ORDER — ISOSORBIDE MONONITRATE ER 30 MG PO TB24
15.0000 mg | ORAL_TABLET | Freq: Every day | ORAL | Status: DC
  Administered 2020-08-11 – 2020-08-13 (×3): 15 mg via ORAL
  Filled 2020-08-11 (×3): qty 1

## 2020-08-11 MED ORDER — DARBEPOETIN ALFA 100 MCG/0.5ML IJ SOSY
100.0000 ug | PREFILLED_SYRINGE | Freq: Once | INTRAMUSCULAR | Status: AC
  Administered 2020-08-12: 100 ug via SUBCUTANEOUS
  Filled 2020-08-11 (×2): qty 0.5

## 2020-08-11 MED ORDER — WITCH HAZEL-GLYCERIN EX PADS
MEDICATED_PAD | CUTANEOUS | Status: DC | PRN
  Administered 2020-08-15: 1 via TOPICAL
  Filled 2020-08-11: qty 100

## 2020-08-11 NOTE — Care Management Important Message (Signed)
Important Message  Patient Details  Name: Vanessa [name] MRN: [id number] Date of Birth: 19-May-1956   Medicare Important Message Given:        Vanessa [name] 08/11/2020, 2:44 PM

## 2020-08-11 NOTE — TOC Progression Note (Addendum)
Transition of Care [facility]) - Progression Note    Patient Details  Name: Vanessa [name] MRN: [id number] Date of Birth: 08-03-1955  Transition of Care [facility] CM/SW [place], [place] Phone Number: 08/11/2020, 1:26 PM  Clinical Narrative:     Update 2/1 3:07pm- CSW spoke with Narda Rutherford with [facility] who confirmed they can accept patient for SNF placement. CSW updated patients daughter. CSW called patients insurance and updated them with SNF choice.  CSW called patient and provided SNF bed offers. Patient wants CSW to see which facility will have SNF bed for patient. CSW called [facility] and they are unable to offer patient a SNF bed tomorrow. CSW called patients daughter Vernie Shanks and Vernie Shanks wants CSW to check in with [facility] to see if they can offer SNF bed for patient. CSW tried to call Janie with [facility], no answer. CSW awaiting call back. CSW started insurance authorization for patient. Reference number is #[id number]. Requested start date is for tomorrow 2/2. CSW faxed over clinicals for review.   CSW will continue to follow.    Expected Discharge Plan: Skilled Nursing Facility Barriers to Discharge: Continued Medical Work up,SNF Pending bed offer  Expected Discharge Plan and Services Expected Discharge Plan: [place] arrangements for the past 2 months: Mobile Home                                       Social Determinants of Health (SDOH) Interventions    Readmission Risk Interventions Readmission Risk Prevention Plan 11/08/2019 10/21/2019 09/25/2019  Transportation Screening Complete Complete Complete  PCP or Specialist Appt within 3-5 Days - - Not Complete  Not Complete comments - - plan for SNF  [place] or [place] - - Complete  Social Work Consult for [place] Planning/Counseling - - Complete  Palliative Care Screening - - Not Applicable  Medication Review Press photographer) Complete Referral to Pharmacy  Referral to Pharmacy  PCP or Specialist appointment within 3-5 days of discharge Complete Complete -  Wing or Home Care Consult Complete (No Data) -  SW Recovery Care/Counseling Consult Complete Complete -  Palliative Care Screening Not Applicable Not Applicable -  Skilled Nursing Facility Complete Complete -  Some recent data might be hidden

## 2020-08-11 NOTE — Progress Notes (Signed)
Chaplain responded to Spiritual Care consult for delivery and explanation of HCPOA.  Chaplain found pt sitting up in chair.  Chaplain pulled chair alongside and assisted pt in filling in the blanks on the form.  Chaplain placed document in pt's bag in the closet.  Chaplain will call pt's daughter to let her know it is there.  Chaplain offered additional assistance.  Pt denied anything further.  [place]

## 2020-08-11 NOTE — Progress Notes (Addendum)
Progress Note  Patient Name: Vanessa [name] Date of Encounter: 08/11/2020  [facility] Cardiologist:New to Dr. Meda Coffee   Subjective   No chest pain. Breathing stable.   Inpatient Medications    Scheduled Meds: . aspirin  81 mg Oral Daily  . carvedilol  12.5 mg Oral BID WC  . ferrous sulfate  325 mg Oral BID WC  . furosemide  80 mg Intravenous Once  . heparin injection (subcutaneous)  5,000 Units Subcutaneous Q8H  . hydrALAZINE  25 mg Oral Q8H  . isosorbide mononitrate  15 mg Oral Daily  . metolazone  5 mg Oral Daily  . nutrition supplement (JUVEN)  1 packet Oral BID BM  . polyethylene glycol  17 g Oral Daily  . sodium bicarbonate  1,300 mg Oral BID WC   Continuous Infusions:  PRN Meds: acetaminophen, diphenhydrAMINE, guaiFENesin-dextromethorphan, hydrocortisone, loratadine, metoprolol tartrate   Vital Signs    Vitals:   08/11/20 0258 08/11/20 0500 08/11/20 0602 08/11/20 0829  BP: (!) 146/88  (!) 142/100 (!) 170/91  Pulse: (!) 120  (!) 115 63  Resp: 19  16 16   Temp: 97.7 F (36.5 C)  97.8 F (36.6 C) (!) 97.3 F (36.3 C)  TempSrc: Axillary  Axillary Axillary  SpO2: 99%  98% 100%  Weight:  102.5 kg    Height:       No intake or output data in the 24 hours ending 08/11/20 0907 Last 3 Weights 08/11/2020 08/10/2020 08/08/2020  Weight (lbs) 225 lb 15.5 oz 226 lb 13.7 oz 253 lb 1.4 oz  Weight (kg) 102.5 kg 102.9 kg 114.8 kg      Telemetry    Sinus rhythm - Personally Reviewed  ECG    N/A  Physical Exam   GEN: Ill appearing female sitting in recliner, no acute distress.   Neck: No JVD Cardiac: RRR, no murmurs, rubs, or gallops.  Respiratory: Clear to auscultation bilaterally. GI: Soft, nontender, non-distended  MS: 3+ edema; No deformity. Neuro:  Nonfocal  Psych: Normal affect   Labs    High Sensitivity Troponin:   Recent Labs  Lab 08/07/20 1736 08/07/20 2140  TROPONINIHS 47* 40*      Chemistry Recent Labs  Lab 08/07/20 1736 08/08/20 0044  08/09/20 1021 08/10/20 0241 08/11/20 0140  NA 142   < > 141 143 143  K 2.8*   < > 4.2 4.6 4.6  CL 102   < > 105 105 105  CO2 27   < > 23 24 24   GLUCOSE 90   < > 113* 126* 92  BUN 37*   < > 47* 51* 61*  CREATININE 3.18*   < > 3.35* 3.64* 3.81*  CALCIUM 7.7*   < > 7.7* 8.0* 8.0*  PROT 6.7  --   --   --   --   ALBUMIN 2.5*  --   --   --   --   AST 48*  --   --   --   --   ALT 60*  --   --   --   --   ALKPHOS 90  --   --   --   --   BILITOT 0.9  --   --   --   --   GFRNONAA 16*   < > 15* 13* 13*  ANIONGAP 13   < > 13 14 14    < > = values in this interval not displayed.     Hematology Recent Labs  Lab 08/07/20 1736  WBC 6.1  RBC 3.01*  HGB 8.8*  HCT 29.7*  MCV 98.7  MCH 29.2  MCHC 29.6*  RDW 17.7*  PLT 251    BNP Recent Labs  Lab 08/07/20 1736  BNP 2,227.0*     DDimer No results for input(s): DDIMER in the last 168 hours.   Radiology    US RENAL  Result Date: 08/09/2020 CLINICAL DATA:  Acute kidney injury EXAM: RENAL / URINARY TRACT ULTRASOUND COMPLETE COMPARISON:  None. FINDINGS: Right Kidney: Renal measurements: 10.2 x 5.4 x 6.8 cm = volume: 186 mL. Echogenicity within normal limits. No suspicious mass or hydronephrosis visualized. Left Kidney: Renal measurements: 6.4 x 2.7 x 4.2 cm = volume: 38 mL. Atrophic. No mass or hydronephrosis visualized. Bladder: Questionable bladder wall thickening. This may be reactive to the adjacent ascites. Other: Large amount of site ease throughout the abdomen. IMPRESSION: 1. RIGHT kidney is unremarkable.  No hydronephrosis. 2. LEFT kidney is atrophic, corresponding to appearance on CT abdomen of 11/07/2019. No hydronephrosis. 3. Questionable bladder wall thickening. This may be reactive to adjacent ascites. Recommend correlation with urinalysis. 4. Large amount of ascites is seen throughout the abdomen and pelvis. Electronically Signed   By: Franki Cabot M.D.   On: 08/09/2020 14:05    Cardiac Studies   Echocardiogram  08/08/20:  1. Left ventricular ejection fraction, by estimation, is 35 to 40%. The  left ventricle has moderately decreased function. The left ventricle  demonstrates global hypokinesis. There is mild concentric left ventricular  hypertrophy. Indeterminate diastolic  filling due to E-A fusion. Elevated left atrial pressure.  2. Right ventricular systolic function is moderately reduced. Mildly  increased right ventricular wall thickness. There is moderately elevated  pulmonary artery systolic pressure. The estimated right ventricular  systolic pressure is 26.9 mmHg.  3. Left atrial size was mildly dilated.  4. The mitral valve is normal in structure. Trivial mitral valve  regurgitation.  5. Tricuspid valve regurgitation is moderate.  6. The aortic valve is normal in structure. Aortic valve regurgitation is  mild. No aortic stenosis is present.  7. The inferior vena cava is dilated in size with <50% respiratory  variability, suggesting right atrial pressure of 15 mmHg.   Patient Profile     65 y.o. female with a hx of endometrial cancer dx 2021, MSSA bacteremia, HTN, HLD, esophageal immobility, DM2, CKD Stage III, asthma and anemia of chronic disease seen for acute systolic CHF.   Pt presented with worsening SOB with LE edema found to be fluid volume overloaded on exam. BNP found to be 2227 with CXR consistent with vascular congestion with increased right pleural effusion and right basilar opacity.   Echocardiogram 08/08/20 showed reduced LV function at 35-40% with global hypokinesis, mild concentric LVH and indeterminate filling pressures with moderately elevated PA pressures at 57.75mmHg and mod TR. Prior echo from 09/2019 with low normal LVEF at 55-60% no RWMA, G2DD, and similarly elevated PA pressures.   Assessment & Plan    1. 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 However no orders. Last lasix IV 80mg  was given 08/08/20 at 1827.  - No accurate I & O recorded but weigh down 1 lb in past 24 hours. However, Scr worsen.  -Strict I & O. I would go ahead and write order for IV lasix 80mg  x  1 then consult nephrology and ask for diuresis assistance.  - BP elevated. No ACE/ARB given CKD. Add Imdur/Hydralazine for afterload reduction.  - Increase Coreg as blood pressure allows.   2. Acute on CKD III - Renal US performed showed an atrophic left kidney. There was no hydronephrosis.   --Baseline Cr appears to be in the 2.0 - SCr of 3.81 today - Lasix as above - Spoke with Dr. Moshe Cipro   3. New onset atrial flutter  - No a anticoagulation candidate given endometrial cancer, bleeding and anemia - Spontaneously converted to sinus rhythm this morning.  - Continue Coreg 12.5mg  BID>> increase as BP allows  CHA2DS2-VASc Score = 5  The patient's score is based upon: CHF History: Yes HTN History: Yes Diabetes History: Yes Stroke History: No Vascular Disease History: No Age Score: 1 Gender Score: 1   4. DM - Per primary team   5. Endometrial cancer - On palliative care likely   6. HTN - As summarized above  Otherwise per primary team   For questions or updates, please contact [place] Please consult [email or web address] for contact info under    SignedLeanor Kail, PA  08/11/2020, 9:07 AM     The patient was seen, examined and discussed with Bhagat,Bhavinkumar PA-C and I agree with the above.   Please see my separate note.  Ena Dawley, MD

## 2020-08-11 NOTE — Progress Notes (Signed)
Daily Progress Note   Patient Name: Vanessa [name]       Date: 08/11/2020 DOB: 1955/12/06  Age: 65 y.o. MRN#: [id number] Attending Physician: Charlynne Cousins, MD Primary Care Physician: Patient, No Pcp Per Admit Date: 08/07/2020  Reason for Consultation/Follow-up: Establishing goals of care  Subjective: Patient awake, alert, oriented and in good spirits this afternoon. She reports feeling better since admitted.   GOC:  Introduced myself with palliative medicine and f/u from her conversation with my colleague Vanessa [name]. Introduced role of palliative medicine.  Reviewed course of hospitalization including diagnoses, interventions, plan of care. Prior to admission, patient living home with her daughter and grandchildren. She acknowledges she is too weak to return home. She confirms plan for SNF rehab. Discussed irreversible nature of her cancer, heart failure, and CKD. Discussed difficulty with managing fluid from heart failure with possible cardiorenal disease/CKD.   We discussed hoping for the best but also preparing for the worst. Patient again speaks of her wish for daughter Vanessa [name]) to be documented HCPOA. Patient spoke with chaplain today and reviewed paperwork. Encouraged ongoing discussions with her daughter regarding her goals and wishes. She shares she does not wish to be a burden on her daughter. Support provided.   Introduced MOST form and consideration of limitations to care with chronic, irreversible conditions. Provided Hard Choices booklet and PMT contact information.   Answered questions. Patient agreeable with outpatient palliative to follow at SNF.    Length of Stay: 4  Current Medications: Scheduled Meds:  . aspirin  81 mg Oral Daily  . carvedilol  25 mg Oral BID WC   . [START ON 08/12/2020] darbepoetin (ARANESP) injection - NON-DIALYSIS  100 mcg Subcutaneous Once  . ferrous sulfate  325 mg Oral BID WC  . furosemide  80 mg Intravenous BID  . heparin injection (subcutaneous)  5,000 Units Subcutaneous Q8H  . hydrALAZINE  25 mg Oral Q8H  . isosorbide mononitrate  15 mg Oral Daily  . metolazone  5 mg Oral Daily  . nutrition supplement (JUVEN)  1 packet Oral BID BM  . polyethylene glycol  17 g Oral Daily  . sodium bicarbonate  1,300 mg Oral BID WC    Continuous Infusions:   PRN Meds: acetaminophen, diphenhydrAMINE, guaiFENesin-dextromethorphan, hydrocortisone, loratadine, metoprolol tartrate, witch hazel-glycerin  Physical Exam Vitals and  nursing note reviewed.  Constitutional:      General: She is awake.  Cardiovascular:     Rate and Rhythm: Rhythm irregularly irregular.  Pulmonary:     Effort: No tachypnea, accessory muscle usage or respiratory distress.  Skin:    General: Skin is warm and dry.  Neurological:     Mental Status: She is alert and oriented to person, place, and time.  Psychiatric:        Mood and Affect: Mood normal.        Speech: Speech normal.        Behavior: Behavior normal.        Cognition and Memory: Cognition normal.            Vital Signs: BP (!) 170/91 (BP Location: Right Arm)   Pulse 63   Temp (!) 97.3 F (36.3 C) (Axillary)   Resp 16   Ht 4\' 11"  (1.499 m)   Wt 102.5 kg   LMP 03/17/2015   SpO2 100%   BMI 45.64 kg/m  SpO2: SpO2: 100 % O2 Device: O2 Device: Room Air O2 Flow Rate:    Intake/output summary:   Intake/Output Summary (Last 24 hours) at 08/11/2020 1434 Last data filed at 08/11/2020 1255 Gross per 24 hour  Intake 480 ml  Output --  Net 480 ml   LBM: Last BM Date: 08/10/20 Baseline Weight: Weight: 117.9 kg Most recent weight: Weight: 102.5 kg       Palliative Assessment/Data: PPS 50%    Flowsheet Rows   Flowsheet Row Most Recent Value  Intake Tab   Referral Department Hospitalist   Unit at Time of Referral Cardiac/Telemetry Unit  Palliative Care Primary Diagnosis Cardiac  Date Notified 08/08/20  Palliative Care Type New Palliative care  Reason for referral Clarify Goals of Care  Date of Admission 08/07/20  Date first seen by Palliative Care 08/08/20  # of days Palliative referral response time 0 Day(s)  # of days IP prior to Palliative referral 1  Clinical Assessment   Palliative Performance Scale Score 30%  Psychosocial & Spiritual Assessment   Palliative Care Outcomes   Patient/Family meeting held? Yes  Who was at the meeting? patient  Palliative Care Outcomes Clarified goals of care, Provided psychosocial or spiritual support, Linked to palliative care logitudinal support      Patient Active Problem List   Diagnosis Date Noted  . Acute respiratory failure with hypoxia ([place]) 08/08/2020  . Acute diastolic CHF (congestive heart failure) ([place]) 08/08/2020  . Elevated troponin 08/08/2020  . Abnormal uterine bleeding due to primary malignant neoplasm of endometrium ([place]) 05/24/2020  . Cellulitis 04/23/2020  . Bilateral lower leg cellulitis 04/22/2020  . Neurocognitive deficits 12/11/2019  . Edema 12/04/2019  . AKI (acute kidney injury) ([place]) 12/03/2019  . Hypothermia 11/26/2019  . CKD (chronic kidney disease) stage 3, GFR 30-59 ml/min (H[facility])   . Hypernatremia   . COVID-19 virus infection   . Generalized weakness   . Hypertensive urgency   . Allergic angioedema 10/19/2019  . Anasarca 10/18/2019  . Angioedema 10/18/2019  . Hypertensive emergency 10/18/2019  . Trichimoniasis 10/17/2019  . Macrocytic anemia 10/05/2019  . Esophageal dysmotility 10/04/2019  . Pressure injury of skin 09/22/2019  . Endometrial cancer ([place]) 09/21/2019  . Left humeral fracture 09/21/2019  . Iron deficiency anemia due to chronic blood loss 09/21/2019  . MSSA bacteremia 09/18/2019  . Postmenopausal bleeding 09/15/2019  . Thrombocytopenia ([place]) 09/15/2019  . Generalized  pruritus 09/15/2019  . Benign hypertension with  CKD (chronic kidney disease) stage III ([place]) 09/15/2019  . Hyperuricemia 09/15/2019  . Hypokalemia 09/15/2019  . Symptomatic anemia 09/14/2019  . Non compliance w medication regimen 03/24/2015  . Dyslipidemia 03/24/2015  . DM type 2 (diabetes mellitus, type 2) ([place]) 05/23/2014  . HTN (hypertension) 05/23/2014    Palliative Care Assessment & Plan   Patient Profile: 65 y.o. female  with past medical history of vaginal bleeding, endometrial carcinoma diagnosed in 2021, COVID-19 viral pneumonia 2021, MSSA bacteremia, essential hypertension, diabetes mellitus type 2, chronic kidney disease stage III, chronic diastolic heart failure, and anemia of chronic disease admitted on 08/07/2020 with weakness, fatigue, cough, and swelling in legs and abdomen. Has not taken prescribed medications in 2 months. She has received palliative radiation in Nov 2021 for endometrial carcinoma, she is not a candidate for hysterectomy. This admission, she was diagnosed with acute diastolic heart failure. Also found to have a fib RVR and AKI on CKD. PMT consulted to discuss [place].  Assessment: Acute combined systolic and diastolic CHF New onset Afib RVR AKI on CKD stage IV Elevated troponin, likely demand ischemia BLE edema Weakness/deconditioning  Recommendations/Plan:  Continue full code/full scope per patient wishes.  Plan is SNF rehab when medically stable. Patient agreeable with outpatient palliative to follow at SNF.  Patient wishes for daughter Vanessa [name]) to serve as documented HCPOA. Paperwork reviewed with chaplain. Patient will review with daughter. Not notarized yet.  Introduced and discussed MOST form. Encouraged ongoing discussions with her daughter regarding her goals and wishes as her health declines.    Code Status: FULL   Code Status Orders  (From admission, onward)         Start     Ordered   08/07/20 2307  Full code  Continuous         08/07/20 2306        Code Status History    Date Active Date Inactive Code Status Order ID Comments User Context   05/24/2020 1549 06/09/2020 2229 Full Code [id number]  Vashti Hey, MD ED   04/22/2020 0433 04/30/2020 1940 Full Code [id number]  Etta Quill, DO ED   11/06/2019 1511 11/22/2019 1708 Full Code [id number]  Mckinley Jewel, MD ED   10/18/2019 1529 10/22/2019 0111 Full Code [id number]  Little Ishikawa, MD ED   10/04/2019 2133 10/10/2019 2231 Full Code [id number]  Vianne Bulls, MD ED   09/14/2019 2053 09/26/2019 0059 Full Code [id number]  Schertz, Michele Mcalpine, MD Inpatient   Advance Care Planning Activity       Prognosis:  Poor long-term  Discharge Planning:  [place] for rehab with Palliative care service follow-up  Care plan was discussed with RN, patient  Thank you for allowing the Palliative Medicine Team to assist in the care of this patient.   Total Time 35 Prolonged Time Billed  no      Greater than 50%  of this time was spent counseling and coordinating care related to the above assessment and plan.  Ihor Dow, DNP, FNP-C Palliative Medicine Team  Phone: [phone] Fax: [phone]  Please contact Palliative Medicine Team phone at [phone] for questions and concerns.

## 2020-08-11 NOTE — Consult Note (Addendum)
Nephrology Consult   Requesting provider: Charlynne Cousins, MD Service requesting consult: Internal med Reason for consult: AKI on CKD, fluid management   Assessment/Recommendations: Vanessa [name] is a 65 y.o. female with a past medical history significant for HFpEF, endometrial cancer diagnosed in 2021, previously treated for COVID-19 in 2021, HTN, HLD, type II DM, CKD stage IV, anemia who present w/ complaints of worsening swelling of her legs and generalized weakness.  AKI on CKD stage IV:  Likely secondary to CHF exacerbation and overall fluid overloaded status.  Patient has put out 250 cc of urine this morning status post 1 dose of IV Lasix 80 mg.  Current GFR estimated at 13, baseline GFR appears to be around 20-30. Baseline creatinine around 2.0-2.4.  Current creatinine 3.81, BUN of 61. Renal ultrasound shows atrophic left kidney without hydronephrosis.  Likely has a cardiorenal component. Appears fluid overloaded on exam with crackles present in the lungs bilaterally, pitting edema up to the level of the abdomen, and ultrasound findings suggestive of some ascites.  -Renal function panel in the a.m. -Monitor Daily I/Os, Daily weights  -Avoid nephrotoxic medications -We will check urinalysis -Continue IV Lasix 80 mg twice daily So I feel that her CKD is likely due to cardiorenal vs this atrophic kidney causing decreased nephron mass in the setting of HTN and DM-  This acute change I feel is likely due to hemodynamic reasons-  Possibly decreased renal perfusion in the setting of Afib or as a result of her volume overload.  She clearly needs diuresis so will do and hope that we see a plateau and improvement in her kidney function back toward her baseline.   Will check u/a  Hypertension: Blood pressure range of 137-170/88-120 over last 24 hours.  Medications include hydralazine, Coreg, metoprolol PRN, as well as Lasix 80 mg IV.  Just try to keep kidneys perfused-  dont let BP get too low,  under 120  Anemia: Likely anemia of chronic disease.  -Hemoglobin 8.8, baseline appears to be between 7.8-8.5 -Continues on ferrous sulfate 325 twice daily with meals -Transfuse for Hgb<7 g/dL -We will give Aranesp 100 mg x 1 -We will check anemia panel  Diabetes Mellitus Type 2 -Glucose control per primary  Acute combined systolic/diastolic CHF exacerbation -Initial BNP greater than 2000, echo showed ejection fraction of 40% with global hypokinesis and diastolic heart failure. -Cardiology on board -Agree with cardiology on continuing Lasix 80 mg IV twice daily- also metolazone added  New onset A. fib with RVR -Not a candidate for anticoagulation due to endometrial cancer with bleeding, continues on Coreg -Continues on Coreg daily and metoprolol PRN for rate control -Recommendations per cardiology  Endometrial cancer -Not a candidate for hysterectomy, palliative consult ordered by primary team.  Type II DM: Previous A1c of 8.4.  Management per primary.  Lurline Del, DO Family Medicine PGY-2 [facility] 08/11/2020 10:08 AM   Patient seen and examined, agree with above note with above modifications. 65 year old BF with multiple medical issues including CKD and CHF.  Now overloaded with worsening renal function.  Needs diuresis and maintenance of renal perfusion, hopefully will see renal function plateau and improve.  She is aware of dialysis -  No indications at present but trend is not good  Corliss Parish, MD 08/11/2020      _____________________________________________________________________________________ CC: Worsening swelling of the legs and generalized weakness  History of Present Illness: Vanessa [name] is a/an 65 y.o. female with a past medical  history of HFpEF, endometrial cancer diagnosed in 2021, previously treated for COVID-19 in 2021, HTN, HLD, type II DM, CKD stage III, anemia of chronic disease  who presents with worsening swelling of  the legs and generalized weakness.  Nephrology was consulted due to patient's AKI on CKD and poor urine output.  Patient states that she had noted several days of feeling weak and increased swelling of her legs prior to presenting to the hospital.  She states that at this time her legs are still more swollen than normal for her.  She states that she has been putting out a good bit of urine today but is unsure if she put out much yesterday.  Review of the lab data shows that renal function has been impaired for quite some time.  During most recent hosp in November of 2021 crt was 2-2.4.  In May of 2021 crt 1.6-1.7.  She denies knowledge of this.  Upon admission here this time crt was as 3.18 and has worsened over the course of hosp to 3.8 today.  Pt denies NSAIDS or any new meds as OP but has been dizzy.  One thing to note today she is massively volume overloaded.     Medications:  Current Facility-Administered Medications  Medication Dose Route Frequency Provider Last Rate Last Admin  . acetaminophen (TYLENOL) tablet 650 mg  650 mg Oral Q6H PRN Charlynne Cousins, MD   650 mg at 08/10/20 1805  . aspirin chewable tablet 81 mg  81 mg Oral Daily Irene Pap N, DO   81 mg at 08/10/20 1034  . carvedilol (COREG) tablet 12.5 mg  12.5 mg Oral Once Aileen Fass, Tammi Klippel, MD      . carvedilol (COREG) tablet 25 mg  25 mg Oral BID WC Charlynne Cousins, MD      . diphenhydrAMINE (BENADRYL) capsule 25 mg  25 mg Oral Q6H PRN Charlynne Cousins, MD   25 mg at 08/10/20 1645  . ferrous sulfate tablet 325 mg  325 mg Oral BID WC Irene Pap N, DO   325 mg at 08/11/20 0910  . guaiFENesin-dextromethorphan (ROBITUSSIN DM) 100-10 MG/5ML syrup 5 mL  5 mL Oral Q4H PRN Charlynne Cousins, MD   5 mL at 08/10/20 1501  . heparin injection 5,000 Units  5,000 Units Subcutaneous Q8H Irene Pap N, DO   5,000 Units at 08/11/20 0960  . hydrALAZINE (APRESOLINE) tablet 25 mg  25 mg Oral Q8H Bhagat, Bhavinkumar, PA      .  hydrocortisone (ANUSOL-HC) 2.5 % rectal cream 1 application  1 application Rectal A54U PRN Kayleen Memos, DO   1 application at 98/11/91 0316  . isosorbide mononitrate (IMDUR) 24 hr tablet 15 mg  15 mg Oral Daily Bhagat, Bhavinkumar, PA      . loratadine (CLARITIN) tablet 10 mg  10 mg Oral Daily PRN Irene Pap N, DO      . metolazone (ZAROXOLYN) tablet 5 mg  5 mg Oral Daily Ena Dawley H, MD      . metoprolol tartrate (LOPRESSOR) injection 5 mg  5 mg Intravenous Q6H PRN Irene Pap N, DO   5 mg at 08/10/20 1454  . nutrition supplement (JUVEN) (JUVEN) powder packet 1 packet  1 packet Oral BID BM Irene Pap N, DO   1 packet at 08/10/20 1453  . polyethylene glycol (MIRALAX / GLYCOLAX) packet 17 g  17 g Oral Daily Irene Pap N, DO   17 g at 08/10/20 1034  .  sodium bicarbonate tablet 1,300 mg  1,300 mg Oral BID WC Hall, Carole N, DO   1,300 mg at 08/11/20 0909     ALLERGIES Ace inhibitors and Angiotensin receptor blockers  MEDICAL HISTORY Past Medical History:  Diagnosis Date  . Anemia 10/09/2019  . Asthma   . Cellulitis and abscess of lower extremity 04/22/2020  . CKD (chronic kidney disease) stage 3, GFR 30-59 ml/min (HCC)   . Dental disease   . Diabetes mellitus without complication ([place])   . Endometrial cancer ([place])   . Esophageal dysmotility   . Heart murmur   . HLD (hyperlipidemia)   . Hypertension   . MSSA bacteremia 09/2019  . Obesity, Class III, BMI 40-49.9 (morbid obesity) ([place])   . Pressure ulcer   . Vaginal bleeding 05/2020     SOCIAL HISTORY Social History   Socioeconomic History  . Marital status: Married    Spouse name: Not on file  . Number of children: Not on file  . Years of education: Not on file  . Highest education level: Not on file  Occupational History  . Not on file  Tobacco Use  . Smoking status: Former Research scientist (life sciences)  . Smokeless tobacco: Never Used  . Tobacco comment: " years ago ", >23yrs  Vaping Use  . Vaping Use: Never used  Substance  and Sexual Activity  . Alcohol use: Yes    Comment: occasional  . Drug use: No  . Sexual activity: Not Currently  Other Topics Concern  . Not on file  Social History Narrative   Lives with her daughter and her 4 children.    Social Determinants of Health   Financial Resource Strain: Not on file  Food Insecurity: Not on file  Transportation Needs: Not on file  Physical Activity: Not on file  Stress: Not on file  Social Connections: Not on file  Intimate Partner Violence: Not on file     FAMILY HISTORY Family History  Problem Relation Age of Onset  . Stroke Mother   . Diabetes Mother   . Hypertension Mother   . Cancer Mother        possible ovarian  . Diabetes Brother   . Hypertension Brother   . Cancer Maternal Grandmother        possible ovarian      Review of Systems: 12 systems reviewed Otherwise as per HPI, all other systems reviewed and negative  Physical Exam: Vitals:   08/11/20 0602 08/11/20 0829  BP: (!) 142/100 (!) 170/91  Pulse: (!) 115 63  Resp: 16 16  Temp: 97.8 F (36.6 C) (!) 97.3 F (36.3 C)  SpO2: 98% 100%   No intake/output data recorded. No intake or output data in the 24 hours ending 08/11/20 1008 General: Elderly female, no acute distress HEENT: anicteric sclera, oropharynx clear without lesions CV: Tachycardic rate, no murmurs, no gallops, no rubs, 1-2+ pitting edema up to the level of the abdomen. Lungs: Fine crackles noted in the bilateral bases, faint end expiratory wheezing, normal work of breathing.  Patient on room air. Abd: soft, non-tender Skin: no visible lesions or rashes Psych: alert, engaged, appropriate mood and affect Musculoskeletal: No obvious deformities Neuro: normal speech, no gross focal deficits   Test Results Reviewed Lab Results  Component Value Date   NA 143 08/11/2020   K 4.6 08/11/2020   CL 105 08/11/2020   CO2 24 08/11/2020   BUN 61 (H) 08/11/2020   CREATININE 3.81 (H) 08/11/2020   GLU 130  12/10/2019   CALCIUM 8.0 (L) 08/11/2020   ALBUMIN 2.5 (L) 08/07/2020   PHOS 3.7 06/03/2020     I have reviewed all relevant outside healthcare records related to the patient's current hospitalization

## 2020-08-11 NOTE — Progress Notes (Signed)
[facility] PROGRESS NOTE    Progress Note  Vanessa [name]  TKZ:[id number] DOB: [birth year] DOA: 08/07/2020 PCP: Patient, No Pcp Per     Brief Narrative:   Vanessa [name] is an 65 y.o. female past medical history significant for endometrial carcinoma diagnosed in 2021, COVID-19 viral pneumonia 2021, MSSA bacteremia, essential hypertension, diabetes mellitus type 2, chronic kidney disease stage III, chronic diastolic heart failure, anemia of chronic disease comes into the ED with worsening lower extremity edema up to her abdomen, she relates she ran out of her medications about 2 weeks prior to admission.  Assessment/Plan:   Acute combined systolic and diastolic heart failure: Likely due to noncompliance with her medication, palliative care has been consulted. 2D echo showed an EF of 40%, global hypokinesis with  diastolic heart failure.   She is not a candidate for cardiac cath due to her advanced renal function. Appreciate cardiology assistance and agree with IV Lasix and metolazone. She continues to be positive they also recommended to consult renal.  New onset A. fib with RVR: Coreg 25 now rate controlled. She is not a candidate for anticoagulation due to her history of endometrial cancer and high risk of bleeding.  Elevated troponin: She denies any chest pain, her cardiac biomarkers have remained basically flat. There is likely demand ischemia in the setting of A. fib with RVR and chronic renal disease. 2D echo showed no wall motion abnormalities.  Acute kidney injury on chronic kidney disease stage IV: With a baseline creatinine around 2.4.  On admission 3.1. Renal ultrasound showed no hydronephrosis, with an atrophic left kidney and her right kidney appears unremarkable. We have notified renal to assist with fluid management.  Refractory hypokalemia: Slowly improving continue oral repletion recheck in the morning. Basic metabolic panel is pending.  Bilateral lower  extremity edema: Likely due to volume overload.  See acute diastolic heart failure for further details.  Anemia of chronic disease in the setting of chronic kidney stage IV and endometrial vaginal cancer with bleeding: Hemoglobin appears to be at baseline, there is no signs of overt bleeding.  Endometrial cancer: Going through the chart she is not a candidate for hysterectomy. Per chart review goal therapy was palliative in order to control her bleeding. Palliative of care has been consulted.  Severe morbid obesity: Noted counseling.  Deconditioning: Physical therapy has been consulted recommended skilled nursing facility.  Goals of care care/ethics: Palliative care was consulted and patient wants to remain a full code although she is considering changing her status depending on her deterioration. Wants to go to skilled nursing facility with palliative care. Patient does not want her spouse to be involved in her care wants her daughter to be her medical decision maker.  Stage II sacral decubitus ulcer present on admission RN Pressure Injury Documentation: Pressure Injury 05/29/20 Thigh Posterior;Proximal;Right Stage 2 -  Partial thickness loss of dermis presenting as a shallow open injury with a red, pink wound bed without slough. 3 small open areas (Active)  05/29/20 2000  Location: Thigh  Location Orientation: Posterior;Proximal;Right  Staging: Stage 2 -  Partial thickness loss of dermis presenting as a shallow open injury with a red, pink wound bed without slough.  Wound Description (Comments): 3 small open areas  Present on Admission: Yes    Estimated body mass index is 45.64 kg/m as calculated from the following:   Height as of this encounter: 4\' 11"  (1.499 m).   Weight as of this encounter: 102.5 kg.  DVT prophylaxis: lovenox Family Communication:none Status is: Inpatient  Remains inpatient appropriate because:Hemodynamically unstable   Dispo:  Patient From:  Home  Planned Disposition: Home with Health Care Svc  Expected discharge date: 08/12/2020  Medically stable for discharge: No Code Status:     Code Status Orders  (From admission, onward)         Start     Ordered   08/07/20 2307  Full code  Continuous        08/07/20 2306        Code Status History    Date Active Date Inactive Code Status Order ID Comments User Context   05/24/2020 1549 06/09/2020 2229 Full Code [id number]  Vashti Hey, MD ED   04/22/2020 0433 04/30/2020 1940 Full Code [id number]  Etta Quill, DO ED   11/06/2019 1511 11/22/2019 1708 Full Code [id number]  Mckinley Jewel, MD ED   10/18/2019 1529 10/22/2019 0111 Full Code [id number]  Little Ishikawa, MD ED   10/04/2019 2133 10/10/2019 2231 Full Code [id number]  Vianne Bulls, MD ED   09/14/2019 2053 09/26/2019 0059 Full Code [id number]  Jonnie Finner, Michele Mcalpine, MD Inpatient   Advance Care Planning Activity        IV Access:    Peripheral IV   Procedures and diagnostic studies:   US RENAL  Result Date: 08/09/2020 CLINICAL DATA:  Acute kidney injury EXAM: RENAL / URINARY TRACT ULTRASOUND COMPLETE COMPARISON:  None. FINDINGS: Right Kidney: Renal measurements: 10.2 x 5.4 x 6.8 cm = volume: 186 mL. Echogenicity within normal limits. No suspicious mass or hydronephrosis visualized. Left Kidney: Renal measurements: 6.4 x 2.7 x 4.2 cm = volume: 38 mL. Atrophic. No mass or hydronephrosis visualized. Bladder: Questionable bladder wall thickening. This may be reactive to the adjacent ascites. Other: Large amount of site ease throughout the abdomen. IMPRESSION: 1. RIGHT kidney is unremarkable.  No hydronephrosis. 2. LEFT kidney is atrophic, corresponding to appearance on CT abdomen of 11/07/2019. No hydronephrosis. 3. Questionable bladder wall thickening. This may be reactive to adjacent ascites. Recommend correlation with urinalysis. 4. Large amount of ascites is seen throughout the abdomen and pelvis. Electronically  Signed   By: Franki Cabot M.D.   On: 08/09/2020 14:05     Medical Consultants:    None.  Anti-Infectives:   none  Subjective:    Vanessa [name] relates her breathing is about the same.  Objective:    Vitals:   08/11/20 0258 08/11/20 0500 08/11/20 0602 08/11/20 0829  BP: (!) 146/88  (!) 142/100 (!) 170/91  Pulse: (!) 120  (!) 115 63  Resp: 19  16 16   Temp: 97.7 F (36.5 C)  97.8 F (36.6 C) (!) 97.3 F (36.3 C)  TempSrc: Axillary  Axillary Axillary  SpO2: 99%  98% 100%  Weight:  102.5 kg    Height:       SpO2: 100 %  No intake or output data in the 24 hours ending 08/11/20 0929 Filed Weights   08/08/20 0421 08/10/20 0600 08/11/20 0500  Weight: 114.8 kg 102.9 kg 102.5 kg    Exam: General exam: In no acute distress. Respiratory system: Good air movement and crackles at bases bilaterally Cardiovascular system: S1 & S2 heard, RRR.  Positive JVD Gastrointestinal system: Abdomen is nondistended, soft and nontender.  Extremities: 3+ lower extremity edema Skin: No rashes, lesions or ulcers Psychiatry: Judgement and insight appear normal. Mood & affect appropriate.  Data Reviewed:    Labs: Basic Metabolic Panel:  Recent Labs  Lab 08/07/20 1736 08/08/20 0044 08/09/20 1021 08/10/20 0241 08/11/20 0140  NA 142 141 141 143 143  K 2.8* 3.2* 4.2 4.6 4.6  CL 102 105 105 105 105  CO2 27 25 23 24 24   GLUCOSE 90 104* 113* 126* 92  BUN 37* 36* 47* 51* 61*  CREATININE 3.18* 3.20* 3.35* 3.64* 3.81*  CALCIUM 7.7* 7.6* 7.7* 8.0* 8.0*  MG 2.2  --   --   --   --    GFR Estimated Creatinine Clearance: 15.5 mL/min (A) (by C-G formula based on SCr of 3.81 mg/dL (H)). Liver Function Tests: Recent Labs  Lab 08/07/20 1736  AST 48*  ALT 60*  ALKPHOS 90  BILITOT 0.9  PROT 6.7  ALBUMIN 2.5*   No results for input(s): LIPASE, AMYLASE in the last 168 hours. Recent Labs  Lab 08/07/20 1740  AMMONIA 26   Coagulation profile Recent Labs  Lab 08/07/20 1736  INR  1.3*   COVID-19 Labs  No results for input(s): DDIMER, FERRITIN, LDH, CRP in the last 72 hours.  Lab Results  Component Value Date   SARSCOV2NAA NEGATIVE 08/07/2020   SARSCOV2NAA NEGATIVE 05/24/2020   [place] NEGATIVE 04/29/2020   [place] NEGATIVE 04/22/2020    CBC: Recent Labs  Lab 08/07/20 1736  WBC 6.1  NEUTROABS 5.3  HGB 8.8*  HCT 29.7*  MCV 98.7  PLT 251   Cardiac Enzymes: No results for input(s): CKTOTAL, CKMB, CKMBINDEX, TROPONINI in the last 168 hours. BNP (last 3 results) No results for input(s): PROBNP in the last 8760 hours. CBG: No results for input(s): GLUCAP in the last 168 hours. D-Dimer: No results for input(s): DDIMER in the last 72 hours. Hgb A1c: No results for input(s): HGBA1C in the last 72 hours. Lipid Profile: No results for input(s): CHOL, HDL, LDLCALC, TRIG, CHOLHDL, LDLDIRECT in the last 72 hours. Thyroid function studies: No results for input(s): TSH, T4TOTAL, T3FREE, THYROIDAB in the last 72 hours.  Invalid input(s): FREET3 Anemia work up: No results for input(s): VITAMINB12, FOLATE, FERRITIN, TIBC, IRON, RETICCTPCT in the last 72 hours. Sepsis Labs: Recent Labs  Lab 08/07/20 1736  WBC 6.1   Microbiology Recent Results (from the past 240 hour(s))  SARS Coronavirus 2 by RT PCR (hospital order, performed in [facility] hospital lab) Nasopharyngeal Nasopharyngeal Swab     Status: None   Collection Time: 08/07/20  6:18 PM   Specimen: Nasopharyngeal Swab  Result Value Ref Range Status   SARS Coronavirus 2 NEGATIVE NEGATIVE Final    Comment: (NOTE) SARS-CoV-2 target nucleic acids are NOT DETECTED.  The SARS-CoV-2 RNA is generally detectable in upper and lower respiratory specimens during the acute phase of infection. The lowest concentration of SARS-CoV-2 viral copies this assay can detect is 250 copies / mL. A negative result does not preclude SARS-CoV-2 infection and should not be used as the sole basis for treatment or  other patient management decisions.  A negative result may occur with improper specimen collection / handling, submission of specimen other than nasopharyngeal swab, presence of viral mutation(s) within the areas targeted by this assay, and inadequate number of viral copies (<250 copies / mL). A negative result must be combined with clinical observations, patient history, and epidemiological information.  Fact Sheet for Patients:   StrictlyIdeas.no  Fact Sheet for Healthcare Providers: BankingDealers.co.za  This test is not yet approved or  cleared by the [place] FDA and has been authorized for detection and/or diagnosis of SARS-CoV-2 by FDA  under an Emergency Use Authorization (EUA).  This EUA will remain in effect (meaning this test can be used) for the duration of the COVID-19 declaration under Section 564(b)(1) of the Act, 21 U.S.C. section 360bbb-3(b)(1), unless the authorization is terminated or revoked sooner.  Performed at [facility], [place] [address] [place], [place] [address]      Medications:   . aspirin  81 mg Oral Daily  . carvedilol  25 mg Oral BID WC  . ferrous sulfate  325 mg Oral BID WC  . heparin injection (subcutaneous)  5,000 Units Subcutaneous Q8H  . hydrALAZINE  25 mg Oral Q8H  . isosorbide mononitrate  15 mg Oral Daily  . metolazone  5 mg Oral Daily  . nutrition supplement (JUVEN)  1 packet Oral BID BM  . polyethylene glycol  17 g Oral Daily  . sodium bicarbonate  1,300 mg Oral BID WC   Continuous Infusions:    LOS: 4 days   Charlynne Cousins  [facility]  08/11/2020, 9:29 AM

## 2020-08-11 NOTE — Progress Notes (Addendum)
Progress Note  Patient Name: Vanessa [name] Date of Encounter: 08/11/2020  [facility] Cardiologist: New patient  Subjective   The patient is sitting comfortably in chair, no SOB at rest.  Inpatient Medications    Scheduled Meds: . aspirin  81 mg Oral Daily  . carvedilol  12.5 mg Oral BID WC  . ferrous sulfate  325 mg Oral BID WC  . heparin injection (subcutaneous)  5,000 Units Subcutaneous Q8H  . nutrition supplement (JUVEN)  1 packet Oral BID BM  . polyethylene glycol  17 g Oral Daily  . sodium bicarbonate  1,300 mg Oral BID WC   Continuous Infusions:  PRN Meds: acetaminophen, diphenhydrAMINE, guaiFENesin-dextromethorphan, hydrocortisone, loratadine, metoprolol tartrate   Vital Signs    Vitals:   08/11/20 0258 08/11/20 0500 08/11/20 0602 08/11/20 0829  BP: (!) 146/88  (!) 142/100 (!) 170/91  Pulse: (!) 120  (!) 115 63  Resp: 19  16 16   Temp: 97.7 F (36.5 C)  97.8 F (36.6 C) (!) 97.3 F (36.3 C)  TempSrc: Axillary  Axillary Axillary  SpO2: 99%  98% 100%  Weight:  102.5 kg    Height:       No intake or output data in the 24 hours ending 08/11/20 0845 Last 3 Weights 08/11/2020 08/10/2020 08/08/2020  Weight (lbs) 225 lb 15.5 oz 226 lb 13.7 oz 253 lb 1.4 oz  Weight (kg) 102.5 kg 102.9 kg 114.8 kg      Telemetry    Atrial flutter with ventricular rates 110-120 - Personally Reviewed  ECG    No new tracing- Personally Reviewed  Physical Exam   GEN: No acute distress.   Neck: No JVD Cardiac: rapid RRR, 2/6 systolic murmur, rubs, or gallops.  Respiratory: Clear to auscultation bilaterally. GI: Soft, nontender, non-distended  MS: Non-pitting B/L 2+ edema up to the thighs; No deformity. Neuro:  Nonfocal  Psych: Normal affect   Labs    High Sensitivity Troponin:   Recent Labs  Lab 08/07/20 1736 08/07/20 2140  TROPONINIHS 47* 40*      Chemistry Recent Labs  Lab 08/07/20 1736 08/08/20 0044 08/09/20 1021 08/10/20 0241 08/11/20 0140  NA 142    < > 141 143 143  K 2.8*   < > 4.2 4.6 4.6  CL 102   < > 105 105 105  CO2 27   < > 23 24 24   GLUCOSE 90   < > 113* 126* 92  BUN 37*   < > 47* 51* 61*  CREATININE 3.18*   < > 3.35* 3.64* 3.81*  CALCIUM 7.7*   < > 7.7* 8.0* 8.0*  PROT 6.7  --   --   --   --   ALBUMIN 2.5*  --   --   --   --   AST 48*  --   --   --   --   ALT 60*  --   --   --   --   ALKPHOS 90  --   --   --   --   BILITOT 0.9  --   --   --   --   GFRNONAA 16*   < > 15* 13* 13*  ANIONGAP 13   < > 13 14 14    < > = values in this interval not displayed.     Hematology Recent Labs  Lab 08/07/20 1736  WBC 6.1  RBC 3.01*  HGB 8.8*  HCT 29.7*  MCV 98.7  MCH 29.2  MCHC 29.6*  RDW 17.7*  PLT 251    BNP Recent Labs  Lab 08/07/20 1736  BNP 2,227.0*     DDimer No results for input(s): DDIMER in the last 168 hours.   Radiology    US RENAL  Result Date: 08/09/2020 CLINICAL DATA:  Acute kidney injury EXAM: RENAL / URINARY TRACT ULTRASOUND COMPLETE COMPARISON:  None. FINDINGS: Right Kidney: Renal measurements: 10.2 x 5.4 x 6.8 cm = volume: 186 mL. Echogenicity within normal limits. No suspicious mass or hydronephrosis visualized. Left Kidney: Renal measurements: 6.4 x 2.7 x 4.2 cm = volume: 38 mL. Atrophic. No mass or hydronephrosis visualized. Bladder: Questionable bladder wall thickening. This may be reactive to the adjacent ascites. Other: Large amount of site ease throughout the abdomen. IMPRESSION: 1. RIGHT kidney is unremarkable.  No hydronephrosis. 2. LEFT kidney is atrophic, corresponding to appearance on CT abdomen of 11/07/2019. No hydronephrosis. 3. Questionable bladder wall thickening. This may be reactive to adjacent ascites. Recommend correlation with urinalysis. 4. Large amount of ascites is seen throughout the abdomen and pelvis. Electronically Signed   By: Franki Cabot M.D.   On: 08/09/2020 14:05   Cardiac Studies   08/08/2020  1. Left ventricular ejection fraction, by estimation, is 35 to 40%. The   left ventricle has moderately decreased function. The left ventricle  demonstrates global hypokinesis. There is mild concentric left ventricular  hypertrophy. Indeterminate diastolic  filling due to E-A fusion. Elevated left atrial pressure.  2. Right ventricular systolic function is moderately reduced. Mildly  increased right ventricular wall thickness. There is moderately elevated  pulmonary artery systolic pressure. The estimated right ventricular  systolic pressure is 25.9 mmHg.  3. Left atrial size was mildly dilated.  4. The mitral valve is normal in structure. Trivial mitral valve  regurgitation.  5. Tricuspid valve regurgitation is moderate.  6. The aortic valve is normal in structure. Aortic valve regurgitation is  mild. No aortic stenosis is present.  7. The inferior vena cava is dilated in size with <50% respiratory  variability, suggesting right atrial pressure of 15 mmHg.   Patient Profile     65 y.o. female  with a hx of endometrial cancer dx 2021, MSSA bacteremia, HTN, HLD, esophageal immobility, DM2, CKD Stage III, asthma and anemia of chronic disease who is being seen today for the evaluation of acute systolic CHF at the request of Dr. Olevia Bowens.  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  At this point the patient is not a candidate for any inotropic support such as milrinone, we will focus on comfort care.  Continue Carvedilol to 25 mg p.o. twice daily, this will work for both hypertension and rate control.  We cannot cardiovert her as she  is not a candidate for anticoagulation given endometrial cancer and bleeding and anemia. If needed we will add Imdur and hydralazine for BP control.  For questions or updates, please contact [place] Please consult [email or web address] for contact info under     Signed, Ena Dawley, MD  08/11/2020, 8:45 AM

## 2020-08-12 DIAGNOSIS — I5031 Acute diastolic (congestive) heart failure: Secondary | ICD-10-CM

## 2020-08-12 DIAGNOSIS — I5033 Acute on chronic diastolic (congestive) heart failure: Secondary | ICD-10-CM | POA: Diagnosis not present

## 2020-08-12 DIAGNOSIS — J9601 Acute respiratory failure with hypoxia: Secondary | ICD-10-CM | POA: Diagnosis not present

## 2020-08-12 DIAGNOSIS — I5043 Acute on chronic combined systolic (congestive) and diastolic (congestive) heart failure: Secondary | ICD-10-CM | POA: Diagnosis not present

## 2020-08-12 DIAGNOSIS — R778 Other specified abnormalities of plasma proteins: Secondary | ICD-10-CM

## 2020-08-12 DIAGNOSIS — N179 Acute kidney failure, unspecified: Secondary | ICD-10-CM | POA: Diagnosis not present

## 2020-08-12 LAB — URINALYSIS, ROUTINE W REFLEX MICROSCOPIC
Bilirubin Urine: NEGATIVE
Glucose, UA: NEGATIVE mg/dL
Ketones, ur: NEGATIVE mg/dL
Nitrite: POSITIVE — AB
Protein, ur: 100 mg/dL — AB
Specific Gravity, Urine: 1.013 (ref 1.005–1.030)
pH: 5 (ref 5.0–8.0)

## 2020-08-12 LAB — CBC
HCT: 30.9 % — ABNORMAL LOW (ref 36.0–46.0)
Hemoglobin: 9.4 g/dL — ABNORMAL LOW (ref 12.0–15.0)
MCH: 29.6 pg (ref 26.0–34.0)
MCHC: 30.4 g/dL (ref 30.0–36.0)
MCV: 97.2 fL (ref 80.0–100.0)
Platelets: 238 10*3/uL (ref 150–400)
RBC: 3.18 MIL/uL — ABNORMAL LOW (ref 3.87–5.11)
RDW: 17.7 % — ABNORMAL HIGH (ref 11.5–15.5)
WBC: 6 10*3/uL (ref 4.0–10.5)
nRBC: 3 % — ABNORMAL HIGH (ref 0.0–0.2)

## 2020-08-12 LAB — RENAL FUNCTION PANEL
Albumin: 2.3 g/dL — ABNORMAL LOW (ref 3.5–5.0)
Anion gap: 12 (ref 5–15)
BUN: 70 mg/dL — ABNORMAL HIGH (ref 8–23)
CO2: 29 mmol/L (ref 22–32)
Calcium: 8.1 mg/dL — ABNORMAL LOW (ref 8.9–10.3)
Chloride: 103 mmol/L (ref 98–111)
Creatinine, Ser: 3.78 mg/dL — ABNORMAL HIGH (ref 0.44–1.00)
GFR, Estimated: 13 mL/min — ABNORMAL LOW (ref >60)
Glucose, Bld: 114 mg/dL — ABNORMAL HIGH (ref 70–99)
Phosphorus: 5.2 mg/dL — ABNORMAL HIGH (ref 2.5–4.6)
Potassium: 3.6 mmol/L (ref 3.5–5.1)
Sodium: 144 mmol/L (ref 135–145)

## 2020-08-12 LAB — IRON AND TIBC
Iron: 115 ug/dL (ref 28–170)
Saturation Ratios: 48 % — ABNORMAL HIGH (ref 10.4–31.8)
TIBC: 241 ug/dL — ABNORMAL LOW (ref 250–450)
UIBC: 126 ug/dL

## 2020-08-12 LAB — RETICULOCYTES
Immature Retic Fract: 38 % — ABNORMAL HIGH (ref 2.3–15.9)
RBC.: 3.15 MIL/uL — ABNORMAL LOW (ref 3.87–5.11)
Retic Count, Absolute: 131 10*3/uL (ref 19.0–186.0)
Retic Ct Pct: 4.2 % — ABNORMAL HIGH (ref 0.4–3.1)

## 2020-08-12 LAB — FERRITIN: Ferritin: 63 ng/mL (ref 11–307)

## 2020-08-12 LAB — VITAMIN B12: Vitamin B-12: 1406 pg/mL — ABNORMAL HIGH (ref 180–914)

## 2020-08-12 LAB — SARS CORONAVIRUS 2 (TAT 6-24 HRS): SARS Coronavirus 2: NEGATIVE

## 2020-08-12 LAB — FOLATE: Folate: 12.3 ng/mL (ref >5.9)

## 2020-08-12 MED ORDER — HYDROXYZINE HCL 10 MG PO TABS
10.0000 mg | ORAL_TABLET | Freq: Three times a day (TID) | ORAL | Status: DC | PRN
  Administered 2020-08-12 – 2020-08-13 (×3): 10 mg via ORAL
  Filled 2020-08-12 (×5): qty 1

## 2020-08-12 NOTE — Progress Notes (Addendum)
Progress Note  Patient Name: Vanessa [name] Date of Encounter: 08/12/2020  [facility] Cardiologist: New to [facility]  Subjective   No acute overnight events. Patient's main complain this morning is significant itching all over. It sounds like she has already talked about this with primary team and they are going to order something for her. She feels better than she did on admission. She still notes shortness of breath if she lays in bed but none sitting up in chair. She is still significantly volume overloaded. No chest pain. No palpitations.  Inpatient Medications    Scheduled Meds: . aspirin  81 mg Oral Daily  . carvedilol  25 mg Oral BID WC  . darbepoetin (ARANESP) injection - NON-DIALYSIS  100 mcg Subcutaneous Once  . ferrous sulfate  325 mg Oral BID WC  . furosemide  80 mg Intravenous BID  . heparin injection (subcutaneous)  5,000 Units Subcutaneous Q8H  . hydrALAZINE  25 mg Oral Q8H  . isosorbide mononitrate  15 mg Oral Daily  . metolazone  5 mg Oral Daily  . nutrition supplement (JUVEN)  1 packet Oral BID BM  . polyethylene glycol  17 g Oral Daily  . sodium bicarbonate  1,300 mg Oral BID WC   Continuous Infusions:  PRN Meds: acetaminophen, diphenhydrAMINE, guaiFENesin-dextromethorphan, hydrocortisone, loratadine, melatonin, metoprolol tartrate, witch hazel-glycerin   Vital Signs    Vitals:   08/11/20 1700 08/11/20 2100 08/12/20 0036 08/12/20 0334  BP: (!) 154/108 (!) 154/110 (!) 145/95 (!) 139/97  Pulse: (!) 116 (!) 114 (!) 113 (!) 111  Resp: 20 20 18 18   Temp: (!) 97.4 F (36.3 C) (!) 97.5 F (36.4 C) (!) 97.4 F (36.3 C) (!) 97.5 F (36.4 C)  TempSrc: Oral Oral Oral Oral  SpO2: 100% 100% 98% 99%  Weight:      Height:        Intake/Output Summary (Last 24 hours) at 08/12/2020 0741 Last data filed at 08/11/2020 1255 Gross per 24 hour  Intake 480 ml  Output --  Net 480 ml   Last 3 Weights 08/11/2020 08/10/2020 08/08/2020  Weight (lbs) 225 lb 15.5 oz 226 lb  13.7 oz 253 lb 1.4 oz  Weight (kg) 102.5 kg 102.9 kg 114.8 kg      Telemetry    Looks like she briefly went into sinus rhythm early this morning but has since gone back into what looks like atrial flutter (difficult to see P wave but rates consistently staying around 110 so suspect atrial flutter). - Personally Reviewed  ECG    No new ECG tracing today. - Personally Reviewed  Physical Exam   GEN: No acute distress.   Neck: JVD elevated. Cardiac: Tachycardic with regular rhythm. No murmurs, rubs, or gallops.  Respiratory: Decreased breath sounds in bases. No significant wheezes, rhonchi, or rales appreciated.  GI: Soft, mildly distended, and non-tender. MS: 2+ pitting edema of bilateral upper extremities up to thighs. No deformity. Skin: Warm and dry. Neuro:  No focal deficits. Psych: Normal affect. Responds appropriately.  Labs    High Sensitivity Troponin:   Recent Labs  Lab 08/07/20 1736 08/07/20 2140  TROPONINIHS 47* 40*      Chemistry Recent Labs  Lab 08/07/20 1736 08/08/20 0044 08/10/20 0241 08/11/20 0140 08/12/20 0237  NA 142   < > 143 143 144  K 2.8*   < > 4.6 4.6 3.6  CL 102   < > 105 105 103  CO2 27   < > 24  24 29  GLUCOSE 90   < > 126* 92 114*  BUN 37*   < > 51* 61* 70*  CREATININE 3.18*   < > 3.64* 3.81* 3.78*  CALCIUM 7.7*   < > 8.0* 8.0* 8.1*  PROT 6.7  --   --   --   --   ALBUMIN 2.5*  --   --   --  2.3*  AST 48*  --   --   --   --   ALT 60*  --   --   --   --   ALKPHOS 90  --   --   --   --   BILITOT 0.9  --   --   --   --   GFRNONAA 16*   < > 13* 13* 13*  ANIONGAP 13   < > 14 14 12    < > = values in this interval not displayed.     Hematology Recent Labs  Lab 08/07/20 1736 08/12/20 0237  WBC 6.1  --   RBC 3.01* 3.15*  HGB 8.8*  --   HCT 29.7*  --   MCV 98.7  --   MCH 29.2  --   MCHC 29.6*  --   RDW 17.7*  --   PLT 251  --     BNP Recent Labs  Lab 08/07/20 1736  BNP 2,227.0*     DDimer No results for input(s): DDIMER  in the last 168 hours.   Radiology    No results found.  Cardiac Studies   Echocardiogram 08/08/2020: Impressions: 1. Left ventricular ejection fraction, by estimation, is 35 to 40%. The  left ventricle has moderately decreased function. The left ventricle  demonstrates global hypokinesis. There is mild concentric left ventricular  hypertrophy. Indeterminate diastolic  filling due to E-A fusion. Elevated left atrial pressure.  2. Right ventricular systolic function is moderately reduced. Mildly  increased right ventricular wall thickness. There is moderately elevated  pulmonary artery systolic pressure. The estimated right ventricular  systolic pressure is 08.6 mmHg.  3. Left atrial size was mildly dilated.  4. The mitral valve is normal in structure. Trivial mitral valve  regurgitation.  5. Tricuspid valve regurgitation is moderate.  6. The aortic valve is normal in structure. Aortic valve regurgitation is  mild. No aortic stenosis is present.  7. The inferior vena cava is dilated in size with <50% respiratory  variability, suggesting right atrial pressure of 15 mmHg.   Comparison(s): Prior images reviewed side by side. The left ventricular  function is significantly worse.   Patient Profile     65 y.o. female with a history of hypertension, hyperlipidemia, type 2 diabetes mellitus, endometrial cancer diagnosed in 2021, esophageal immobility, asthma, CKD stage III, and anemia of chronic disease who is being seen today for evaluation of acute systolic CH at the request of Dr. Olevia Bowens.  Assessment & Plan    Acute on Chronic Combined CHF - Patient presented with worsening shortness of breath and lower extremity edema and was noted to be volume overloaded on exam.  - BNP elevated at 2,227. - Chest x-ray consistent with vascular congestion with increased right pleural effusion and right basilar. - Echo showed LVEF of 35-405 with global hypokinesis, mild LVH, and indeterminate  filling pressures with moderately elevated PA pressures and moderate TR. Prior Echo in 09/2019 showed LVEF of 55-60%.  - She was diuresed with IV Lasix but had worsening renal function with creatinine peaking at 3.81 yesterday  so diuretics were stopped. IV Lasix 80mg  twice daily was restarted yesterday due to continued volume overload. Patient was also given a dose of Metolazone 5mg . Patient has had no documented outputs this admission. Weight 225 lbs yesterday, down from 260 lbs on admission (unsure if this is accurate). Creatinine stable at 3.78 today. - She is still significantly volume overloaded on exam. - Will defer diuresis to Nephrology given renal function. - No ACEi/ARB/ARNI or MRA due to renal function. - Continue Coreg 25mg  twice daily. - Continue Hydralazine 25mg  three times daily and Imdur 15mg  daily.  - Continue to monitor daily weights, strict I/O's, and renal function. Patient states she urinated about 5 times overnight but nothing documented. Spoke with RN and will measure urine output today.  New Onset Atrial Flutter - Still in atrial flutter with rates in the 110's. - Continue Coreg 25mg  twice daily. - Rates reasonably well controlled. Consider Amiodarone if rates increase. - CHA2DS2-VASc = 5 (CHF, HTN, DM, age, female). Not a candidate for chronic anticoagulation due to endometrial bleeding secondary to endometrial cancer. Not a candidate for DCCV due to absence of anticoagulation. Not a candidate for Digoxin due to poor renal function.   Elevated Troponin - High-sensitivity troponin minimally elevated and flat at 47 >> 40. - No chest pain. - Likely demand ischemia in setting of CHF and atrial flutter with RVR.  Hypertension - BP mildly elevated. - Per Nephrology, we want to make sure we keep kidney perfused and don't drop BP too low. Therefore, will continue current medications for now.  Acute on CKD Stage III - Creatinine 2.48 on admission. Peaked at 3.81 today.  Baseline around 2.0. - Creatinine stable at 3.78 today. - Renal ultrasound showed atrophic left kidney with normal appearing right kidney. - Nephrology consulted. CKD felt to be due to cardiorenal syndrome vs atrophic kidney causing decreased nephron mass in the setting of HTN and DM. Acute change felt to be secondary to hemodynamic changes.  - Management per Nephrology.  Of note, palliative care has been consulted to discuss goals of care given endometrial cancer.  Otherwise, per primary team: - Type 2 diabetes - Endometrial cancer - Anemia of chronic disease  For questions or updates, please contact [facility] Please consult [email or web address] for contact info under     Signed, Darreld Mclean, PA-C  08/12/2020, 7:41 AM    The patient was seen, examined and discussed with Cadence Ninfa Meeker, PA-C   and I agree with the above.   This is an unfortunate 65 year old female with acute on chronic combined CHF, new onset atrial flutter, hypertensive heart disease, acute on chronic stage IV kidney disease with cardiorenal syndrome and atrophic kidneys as well as endometrial cancer. The patient remains significantly fluid overloaded and while at this point we are focusing more on comfort care/palliative care, I feel we can still help this patient to feel better I would continue aggressive diuresis with high-dose Lasix and metolazone 5 mg daily, nephrology input is greatly appreciated here.  Creatinine unfortunately continues to rise.  Today 3.78.  Ena Dawley, MD 08/12/2020

## 2020-08-12 NOTE — Progress Notes (Signed)
PROGRESS NOTE    Vanessa [name]  HFW:[id number] DOB: July 07, 1956 DOA: 08/07/2020 PCP: Patient, No Pcp Per   Brief Narrative:  HPI on 08/07/2020 by Dr. Irene Pap Vanessa [name] is a 65 y.o. female with medical history significant for endometrial cancer diagnosed in 2021, previously treated COVID-19 viral pneumonia in 2021, MSSA bacteremia, essential hypertension, hyperlipidemia, esophageal dysmotility, type 2 diabetes, CKD 3, asthma, anemia of chronic disease who presented to [facility] ED due to worsening edema involving her lower extremities up to her abdomen.  States she ran out of her medications 2 weeks ago.  Associated with generalized weakness and shortness of breath.  Denies any chest pain.  Endorses that she has had a poor appetite.  Work-up in the ED revealed volume overload for which she was started on IV diuretics by EDP and Afib with RvR.  TRH was asked to admit.  Interim history Patient admitted with worsening lower extremity edema -acute combined systolic and diastolic heart failure.  Cardiology and nephrology consulted as patient continues to be on high-dose IV Lasix, in the setting of CKD, stage IV.  Also known to have new onset atrial fibrillation, although she is not a candidate for anticoagulation given her history of endometrial cancer and high risk of bleeding. Assessment & Plan   Acute combined systolic and diastolic heart failure -Presented with weakness and worsening swelling in her extremities up to her abdomen -BNP greater than 2200 -Echocardiogram shows an EF of 35 to 40%, left ventricle demonstrates global hypokinesis.  Indeterminate diastolic function.  RV systolic function mildly reduced, moderately elevated pulmonary artery systolic pressure, estimated right ventricular systolic pressure 02.7 mmHg. -Continue to monitor intake and output, daily weight -Continue IV Lasix 80 mg twice daily and metolazone -Cardiology and nephrology consulted and appreciated  New onset  atrial fibrillation with RVR -Continue Coreg -Patient is not a candidate for anticoagulation due to history of endometrial cancer and high risk of bleeding  Elevated troponin  -Suspect secondary to demand ischemia in the setting of atrial fibrillation, chronic kidney disease, heart failure exacerbation -Echocardiogram showed no wall motion abnormalities  Acute kidney injury on chronic kidney disease, stage IV -On admission creatinine was 3.1 (baseline creatinine approximately 2.4) -Renal ultrasound showed no hydronephrosis, with an atrophic left kidney and her right kidney appears unremarkable -Nephrology consulted and appreciated -Continue with IV Lasix -Creatinine currently 3.78  Refractory hypokalemia -Likely secondary to diuretics -Potassium 3.6 -Continue to monitor BMP and replace as needed  Bilateral lower extremity edema -Secondary to volume overload -Continue treatment and plan as above  Anemia of chronic disease -Hemoglobin appears to be stable, no overt signs of bleeding  Endometrial cancer -Patient was not a candidate for hysterectomy -Per chart review, goal therapy was palliative in order to control her bleeding -Palliative care consulted and appreciated  Severe morbid obesity -BMI 45.64  Deconditioning -PT recommending SNF  Goals of care -Palliative care consulted and appreciated, patient wishes to remain full code although she is considering changing her status depending on deterioration. -We will discharge to SNF with palliative care to follow -Patient does not want her spouse to be involved in her care however wants daughter to be her medical decision maker  Stage II sacral decubitus ulcer -Present on admission   DVT Prophylaxis Heparin  Code Status: Full  Family Communication: none at bedside  Disposition Plan:  Status is: Inpatient  Remains inpatient appropriate because:IV treatments appropriate due to intensity of illness or inability to take  PO  Dispo:  Patient From: Home  Planned Disposition: Home with Health Care Svc  Expected discharge date: 08/13/2020  Medically stable for discharge: No    Consultants Cardiology Nephrology  Procedures  Echocardiogram  Antibiotics   Anti-infectives (From admission, onward)   None      Subjective:   Vanessa [name] seen and examined today.  Patient complains of itching this morning.  Still feels that she is swollen.  Complains of shortness of breath.  Denies current chest pain, abdominal pain, nausea or vomiting, dizziness or headache.     Objective:   Vitals:   08/12/20 0334 08/12/20 0848 08/12/20 0900 08/12/20 1454  BP: (!) 139/97 (!) 127/94 (!) 127/94 (!) 133/96  Pulse: (!) 111 (!) 108 (!) 108 (!) 108  Resp: 18 16 17 18   Temp: (!) 97.5 F (36.4 C) 97.6 F (36.4 C) 97.6 F (36.4 C) 98 F (36.7 C)  TempSrc: Oral Oral Axillary Oral  SpO2: 99% 100% 100%   Weight:      Height:        Intake/Output Summary (Last 24 hours) at 08/12/2020 1502 Last data filed at 08/12/2020 0848 Gross per 24 hour  Intake 300 ml  Output 150 ml  Net 150 ml   Filed Weights   08/08/20 0421 08/10/20 0600 08/11/20 0500  Weight: 114.8 kg 102.9 kg 102.5 kg    Exam  General: Well developed, chronically ill-appearing, NAD  HEENT: NCAT, mucous membranes moist.  Poor dentition  Cardiovascular: S1 S2 auscultated, tachycardic  Respiratory: Diminished breath sounds, no wheezing or rhonchi  Abdomen: Soft, obese, nontender, nondistended, + bowel sounds  Extremities: warm dry without cyanosis clubbing. 2+ lower extremity pitting edema  Neuro: AAOx3, nonfocal  Psych: Normal affect and demeanor with intact judgement and insight   Data Reviewed: I have personally reviewed following labs and imaging studies  CBC: Recent Labs  Lab 08/07/20 1736 08/12/20 0832  WBC 6.1 6.0  NEUTROABS 5.3  --   HGB 8.8* 9.4*  HCT 29.7* 30.9*  MCV 98.7 97.2  PLT 251 106   Basic Metabolic Panel: Recent  Labs  Lab 08/07/20 1736 08/08/20 0044 08/09/20 1021 08/10/20 0241 08/11/20 0140 08/12/20 0237  NA 142 141 141 143 143 144  K 2.8* 3.2* 4.2 4.6 4.6 3.6  CL 102 105 105 105 105 103  CO2 27 25 23 24 24 29   GLUCOSE 90 104* 113* 126* 92 114*  BUN 37* 36* 47* 51* 61* 70*  CREATININE 3.18* 3.20* 3.35* 3.64* 3.81* 3.78*  CALCIUM 7.7* 7.6* 7.7* 8.0* 8.0* 8.1*  MG 2.2  --   --   --   --   --   PHOS  --   --   --   --   --  5.2*   GFR: Estimated Creatinine Clearance: 15.7 mL/min (A) (by C-G formula based on SCr of 3.78 mg/dL (H)). Liver Function Tests: Recent Labs  Lab 08/07/20 1736 08/12/20 0237  AST 48*  --   ALT 60*  --   ALKPHOS 90  --   BILITOT 0.9  --   PROT 6.7  --   ALBUMIN 2.5* 2.3*   No results for input(s): LIPASE, AMYLASE in the last 168 hours. Recent Labs  Lab 08/07/20 1740  AMMONIA 26   Coagulation Profile: Recent Labs  Lab 08/07/20 1736  INR 1.3*   Cardiac Enzymes: No results for input(s): CKTOTAL, CKMB, CKMBINDEX, TROPONINI in the last 168 hours. BNP (last 3 results) No results for input(s): PROBNP in  the last 8760 hours. HbA1C: No results for input(s): HGBA1C in the last 72 hours. CBG: No results for input(s): GLUCAP in the last 168 hours. Lipid Profile: No results for input(s): CHOL, HDL, LDLCALC, TRIG, CHOLHDL, LDLDIRECT in the last 72 hours. Thyroid Function Tests: No results for input(s): TSH, T4TOTAL, FREET4, T3FREE, THYROIDAB in the last 72 hours. Anemia Panel: Recent Labs    08/12/20 0237  VITAMINB12 1,406*  FOLATE 12.3  FERRITIN 63  TIBC 241*  IRON 115  RETICCTPCT 4.2*   Urine analysis:    Component Value Date/Time   COLORURINE YELLOW 08/12/2020 1155   APPEARANCEUR HAZY (A) 08/12/2020 1155   LABSPEC 1.013 08/12/2020 1155   PHURINE 5.0 08/12/2020 1155   GLUCOSEU NEGATIVE 08/12/2020 1155   HGBUR LARGE (A) 08/12/2020 1155   BILIRUBINUR NEGATIVE 08/12/2020 1155   KETONESUR NEGATIVE 08/12/2020 1155   PROTEINUR 100 (A) 08/12/2020  1155   UROBILINOGEN 0.2 03/06/2014 1644   NITRITE POSITIVE (A) 08/12/2020 1155   LEUKOCYTESUR TRACE (A) 08/12/2020 1155   Sepsis Labs: @LABRCNTIP (procalcitonin:4,lacticidven:4)  ) Recent Results (from the past 240 hour(s))  SARS Coronavirus 2 by RT PCR (hospital order, performed in [place] hospital lab) Nasopharyngeal Nasopharyngeal Swab     Status: None   Collection Time: 08/07/20  6:18 PM   Specimen: Nasopharyngeal Swab  Result Value Ref Range Status   SARS Coronavirus 2 NEGATIVE NEGATIVE Final    Comment: (NOTE) SARS-CoV-2 target nucleic acids are NOT DETECTED.  The SARS-CoV-2 RNA is generally detectable in upper and lower respiratory specimens during the acute phase of infection. The lowest concentration of SARS-CoV-2 viral copies this assay can detect is 250 copies / mL. A negative result does not preclude SARS-CoV-2 infection and should not be used as the sole basis for treatment or other patient management decisions.  A negative result may occur with improper specimen collection / handling, submission of specimen other than nasopharyngeal swab, presence of viral mutation(s) within the areas targeted by this assay, and inadequate number of viral copies (<250 copies / mL). A negative result must be combined with clinical observations, patient history, and epidemiological information.  Fact Sheet for Patients:   StrictlyIdeas.no  Fact Sheet for Healthcare Providers: BankingDealers.co.za  This test is not yet approved or  cleared by the [place] FDA and has been authorized for detection and/or diagnosis of SARS-CoV-2 by FDA under an Emergency Use Authorization (EUA).  This EUA will remain in effect (meaning this test can be used) for the duration of the COVID-19 declaration under Section 564(b)(1) of the Act, 21 U.S.C. section 360bbb-3(b)(1), unless the authorization is terminated or revoked sooner.  Performed at  [facility], [place] [address] [place], [place] [address]       Radiology Studies: No results found.   Scheduled Meds: . aspirin  81 mg Oral Daily  . carvedilol  25 mg Oral BID WC  . ferrous sulfate  325 mg Oral BID WC  . furosemide  80 mg Intravenous BID  . heparin injection (subcutaneous)  5,000 Units Subcutaneous Q8H  . hydrALAZINE  25 mg Oral Q8H  . isosorbide mononitrate  15 mg Oral Daily  . metolazone  5 mg Oral Daily  . nutrition supplement (JUVEN)  1 packet Oral BID BM  . polyethylene glycol  17 g Oral Daily  . sodium bicarbonate  1,300 mg Oral BID WC   Continuous Infusions:   LOS: 5 days   Time Spent in minutes   45 minutes  Gurkirat Basher D.O. on 08/12/2020 at 3:02 PM  Between 7am to 7pm - Please see pager noted on [email or web address]  After 7pm go to [email or web address]  And look for the night coverage person covering for me after hours  [facility] Office  [phone]

## 2020-08-12 NOTE — TOC Progression Note (Addendum)
Transition of Care [facility]) - Progression Note    Patient Details  Name: Cole Klugh MRN: [id number] Date of Birth: [birth year]  Transition of Care [facility]) CM/SW [place], [place] Phone Number: 08/12/2020, 8:42 AM  Clinical Narrative:      Update 2/2 1:23pm- CSW received consult for outpatient palliative services to follow patient at SNF. CSW made referral to Audrea Muscat with [facility].  Patient has SNF bed at [facility]. Insurance authorization has been approved.   CSW will continue to follow. Patients insurance has been approved for SNF placement. [place] Reference ID # is [id number]. Insurance has been approved from 2/2-2/4. Next review date will be 2/4.  Patient has SNF bed at [facility]. Insurance authorization has been approved.  CSW will continue to follow.   Expected Discharge Plan: Skilled Nursing Facility Barriers to Discharge: Continued Medical Work up,SNF Pending bed offer  Expected Discharge Plan and Services Expected Discharge Plan: [place] arrangements for the past 2 months: Mobile Home                                       Social Determinants of Health (SDOH) Interventions    Readmission Risk Interventions Readmission Risk Prevention Plan 11/08/2019 10/21/2019 09/25/2019  Transportation Screening Complete Complete Complete  PCP or Specialist Appt within 3-5 Days - - Not Complete  Not Complete comments - - plan for SNF  [place] or [place] - - Complete  Social Work Consult for [place] Planning/Counseling - - Complete  Palliative Care Screening - - Not Applicable  Medication Review Press photographer) Complete Referral to Pharmacy Referral to Pharmacy  PCP or Specialist appointment within 3-5 days of discharge Complete Complete -  [place] or Home Care Consult Complete (No Data) -  SW Recovery Care/Counseling Consult Complete Complete -  Palliative Care Screening Not Applicable Not Applicable -   Skilled Nursing Facility Complete Complete -  Some recent data might be hidden

## 2020-08-12 NOTE — Progress Notes (Addendum)
Vanessa [name] Progress Note    Assessment/ Plan:   1. AKI on CKD -creatinine stable 3.81> 3.78. Urine output not documented this morning. Unfortunately weight was also not done this morning. Patient endorses 5 voids since last night. 26mL present in container. Continues to appear volume overloaded on physical exam. UA ordered. -Continue Lasix 80 mg IV twice daily. -Daily weights -Strict I's and O's -Urinalysis as above  2. Anemia of chronic disease: Hgb 9.4, Iron studies: Iron within normal limits, TIBC reduced at 249, saturation ratio increased at 48. Ferritin within normal limits. -Aranesp 100 mg  3. HTN: Blood pressure range 139-154/97-110 over last 24 hours.  Continues on hydralazine 25 mg every 8 hours, metolazone 5 mg daily, Lopressor 5 mg IV every 6 hours for heart rate greater than 110, carvedilol 25 mg twice daily. -Maintain blood pressure greater than 132 systolic for kidney profusion   4. Nutrition  Protein Calorie Malnutrition  Hypoalbuminemia: Albumin 2.3  Phos 5.2  Patient seen and examined, agree with above note with above modifications. Pt with mainly right sided heart failure and CKD likely due to cardiorenal +/- atrophic kidney. She has A on CRF seems like from hemodynamic cause.  She is overloaded so needs diuresis-  unfortunately have no objective measures of our diuresis efforts-  Cont same dosing and try again for I/Os and weights-   U/A shows 100 of prot but also not a small amount of RBC and WBC- but also epithelial cells so difficult to interpret  Corliss Parish, MD 08/12/2020      Subjective:   Patient states that she had 5 bouts of urine since last night.  She states that she feels that she is holding a bit less fluid today than before. No uop recorded, no weights, pt maybe endorses some good uop- feels lighter- crt pretty stable    Objective:   BP (!) 139/97 (BP Location: Right Arm)   Pulse (!) 111   Temp (!) 97.5 F (36.4 C) (Oral)    Resp 18   Ht 4\' 11"  (1.499 m)   Wt 102.5 kg   LMP 03/17/2015   SpO2 99%   BMI 45.64 kg/m   Intake/Output Summary (Last 24 hours) at 08/12/2020 4401 Last data filed at 08/11/2020 1255 Gross per 24 hour  Intake 480 ml  Output -  Net 480 ml   Weight change:   Physical Exam: General: Alert and oriented in no apparent distress Heart: Tachycardic rate, no murmurs appreciated, 1+ pitting edema noted up to the area of the abdomen Lungs: Faint crackles present in bilateral bases Abdomen: Bowel sounds present, no abdominal pain  Imaging: No results found.  Labs: BMET Recent Labs  Lab 08/07/20 1736 08/08/20 0044 08/09/20 1021 08/10/20 0241 08/11/20 0140 08/12/20 0237  NA 142 141 141 143 143 144  K 2.8* 3.2* 4.2 4.6 4.6 3.6  CL 102 105 105 105 105 103  CO2 27 25 23 24 24 29   GLUCOSE 90 104* 113* 126* 92 114*  BUN 37* 36* 47* 51* 61* 70*  CREATININE 3.18* 3.20* 3.35* 3.64* 3.81* 3.78*  CALCIUM 7.7* 7.6* 7.7* 8.0* 8.0* 8.1*  PHOS  --   --   --   --   --  5.2*   CBC Recent Labs  Lab 08/07/20 1736  WBC 6.1  NEUTROABS 5.3  HGB 8.8*  HCT 29.7*  MCV 98.7  PLT 251    Medications:    . aspirin  81 mg Oral Daily  .  carvedilol  25 mg Oral BID WC  . darbepoetin (ARANESP) injection - NON-DIALYSIS  100 mcg Subcutaneous Once  . ferrous sulfate  325 mg Oral BID WC  . furosemide  80 mg Intravenous BID  . heparin injection (subcutaneous)  5,000 Units Subcutaneous Q8H  . hydrALAZINE  25 mg Oral Q8H  . isosorbide mononitrate  15 mg Oral Daily  . metolazone  5 mg Oral Daily  . nutrition supplement (JUVEN)  1 packet Oral BID BM  . polyethylene glycol  17 g Oral Daily  . sodium bicarbonate  1,300 mg Oral BID WC     Lurline Del, DO [facility] Resident, PGY-2 08/12/2020, 8:28 AM

## 2020-08-12 NOTE — Progress Notes (Signed)
Physical Therapy Treatment Patient Details Name: Vanessa [name] MRN: [id number] DOB: Feb 28, 1956 Today's Date: 08/12/2020    History of Present Illness 65 y.o. female past medical history significant for endometrial carcinoma diagnosed in 2021, COVID-19 viral pneumonia 2021, MSSA bacteremia, essential hypertension, diabetes mellitus type 2, chronic kidney disease stage III, chronic diastolic heart failure, anemia of chronic disease. Admitted with a. fib with RVR, acute diastolic heart failure, and hypokalemia.    PT Comments    Pt is progressing well towards goals. She was able to ambulate short distance in room today, with short seated break for toileting. Pt stated several times how good it felt to be up. She performed ADLs at sink, with heavy reliance on UE support from counter. Pt requesting "grabber" and reports she is having trouble with dressing and reaching for items. She may benefit form OT services. Spoke with PT, Rayetta Humphrey, who ordered OT per protocol. Will continue to follow acutely for mobility progression.     Follow Up Recommendations  SNF     Equipment Recommendations  None recommended by PT    Recommendations for Other Services       Precautions / Restrictions Precautions Precautions: Fall    Mobility  Bed Mobility               General bed mobility comments: Up in chair on arrival  Transfers Overall transfer level: Needs assistance Equipment used: Rolling walker (2 wheeled) Transfers: Sit to/from Stand Sit to Stand: Min assist         General transfer comment: min A to power up from recliner chair and BSC. Use of arm rests and cues for technique.  Ambulation/Gait Ambulation/Gait assistance: Min guard Gait Distance (Feet): 15 Feet (85ft, 37ft) Assistive device: Rolling walker (2 wheeled) Gait Pattern/deviations: Step-to pattern;Decreased stride length;Trunk flexed     General Gait Details: Pt able to ambulate short distance in room. Pt  with urgent need to urinate during initial gait trial and sat on [facility] before continuing an additional 15 ft in room. VS for RW proximity and postural control.   Stairs             Wheelchair Mobility    Modified Rankin (Stroke Patients Only)       Balance Overall balance assessment: Needs assistance Sitting-balance support: No upper extremity supported Sitting balance-Leahy Scale: Fair     Standing balance support: Single extremity supported Standing balance-Leahy Scale: Poor Standing balance comment: stood for ~10 min at sink for hand hygiene, wash face, brush teeth, and comb hair. Heavy BIL UE support from elbows on counter during all activities at sink.                            Cognition Arousal/Alertness: Awake/alert Behavior During Therapy: WFL for tasks assessed/performed Overall Cognitive Status: Within Functional Limits for tasks assessed                                        Exercises      General Comments General comments (skin integrity, edema, etc.): min A required for peri care.      Pertinent Vitals/Pain Pain Assessment: No/denies pain    Home Living                      Prior Function  PT Goals (current goals can now be found in the care plan section) Acute Rehab PT Goals Patient Stated Goal: Get her strength back PT Goal Formulation: With patient Time For Goal Achievement: 08/22/20 Potential to Achieve Goals: Good Progress towards PT goals: Progressing toward goals    Frequency    Min 3X/week      PT Plan Current plan remains appropriate    Co-evaluation              AM-PAC PT "6 Clicks" Mobility   Outcome Measure  Help needed turning from your back to your side while in a flat bed without using bedrails?: A Little Help needed moving from lying on your back to sitting on the side of a flat bed without using bedrails?: A Little Help needed moving to and from a bed to a  chair (including a wheelchair)?: A Lot Help needed standing up from a chair using your arms (e.g., wheelchair or bedside chair)?: A Lot Help needed to walk in hospital room?: A Lot Help needed climbing 3-5 steps with a railing? : Total 6 Click Score: 13    End of Session Equipment Utilized During Treatment: Gait belt Activity Tolerance: Patient tolerated treatment well Patient left: with call bell/phone within reach;in chair;with nursing/sitter in room Nurse Communication: Mobility status PT Visit Diagnosis: Unsteadiness on feet (R26.81);Muscle weakness (generalized) (M62.81);Difficulty in walking, not elsewhere classified (R26.2)     Time: [id number] PT Time Calculation (min) (ACUTE ONLY): 48 min  Charges:  $Gait Training: 23-37 mins $Therapeutic Activity: 8-22 mins                     Benjiman Core, [place] Pager [phone] Acute Rehab   Allena Katz 08/12/2020, 3:04 PM

## 2020-08-12 NOTE — Care Management Important Message (Signed)
Important Message  Patient Details  Name: Vanessa [name] MRN: [id number] Date of Birth: 1955-07-16   Medicare Important Message Given:  Yes     Shelda Altes 08/12/2020, 1:07 PM

## 2020-08-13 DIAGNOSIS — J9601 Acute respiratory failure with hypoxia: Secondary | ICD-10-CM | POA: Diagnosis not present

## 2020-08-13 DIAGNOSIS — I5033 Acute on chronic diastolic (congestive) heart failure: Secondary | ICD-10-CM | POA: Diagnosis not present

## 2020-08-13 DIAGNOSIS — I5043 Acute on chronic combined systolic (congestive) and diastolic (congestive) heart failure: Secondary | ICD-10-CM | POA: Diagnosis not present

## 2020-08-13 DIAGNOSIS — N179 Acute kidney failure, unspecified: Secondary | ICD-10-CM | POA: Diagnosis not present

## 2020-08-13 DIAGNOSIS — I5031 Acute diastolic (congestive) heart failure: Secondary | ICD-10-CM | POA: Diagnosis not present

## 2020-08-13 LAB — RENAL FUNCTION PANEL
Albumin: 2.3 g/dL — ABNORMAL LOW (ref 3.5–5.0)
Anion gap: 12 (ref 5–15)
BUN: 74 mg/dL — ABNORMAL HIGH (ref 8–23)
CO2: 28 mmol/L (ref 22–32)
Calcium: 8 mg/dL — ABNORMAL LOW (ref 8.9–10.3)
Chloride: 103 mmol/L (ref 98–111)
Creatinine, Ser: 3.62 mg/dL — ABNORMAL HIGH (ref 0.44–1.00)
GFR, Estimated: 13 mL/min — ABNORMAL LOW (ref >60)
Glucose, Bld: 145 mg/dL — ABNORMAL HIGH (ref 70–99)
Phosphorus: 5.2 mg/dL — ABNORMAL HIGH (ref 2.5–4.6)
Potassium: 3.6 mmol/L (ref 3.5–5.1)
Sodium: 143 mmol/L (ref 135–145)

## 2020-08-13 LAB — CBC
HCT: 29.6 % — ABNORMAL LOW (ref 36.0–46.0)
Hemoglobin: 9 g/dL — ABNORMAL LOW (ref 12.0–15.0)
MCH: 29.5 pg (ref 26.0–34.0)
MCHC: 30.4 g/dL (ref 30.0–36.0)
MCV: 97 fL (ref 80.0–100.0)
Platelets: 200 10*3/uL (ref 150–400)
RBC: 3.05 MIL/uL — ABNORMAL LOW (ref 3.87–5.11)
RDW: 17.8 % — ABNORMAL HIGH (ref 11.5–15.5)
WBC: 4.9 10*3/uL (ref 4.0–10.5)
nRBC: 2.2 % — ABNORMAL HIGH (ref 0.0–0.2)

## 2020-08-13 MED ORDER — FUROSEMIDE 10 MG/ML IJ SOLN
160.0000 mg | Freq: Two times a day (BID) | INTRAVENOUS | Status: DC
  Administered 2020-08-13 – 2020-08-15 (×4): 160 mg via INTRAVENOUS
  Filled 2020-08-13: qty 16
  Filled 2020-08-13: qty 10
  Filled 2020-08-13: qty 16
  Filled 2020-08-13: qty 10
  Filled 2020-08-13: qty 4

## 2020-08-13 MED ORDER — ISOSORBIDE MONONITRATE ER 30 MG PO TB24
30.0000 mg | ORAL_TABLET | Freq: Every day | ORAL | Status: DC
  Administered 2020-08-14 – 2020-08-18 (×5): 30 mg via ORAL
  Filled 2020-08-13 (×5): qty 1

## 2020-08-13 MED ORDER — HYDROXYZINE HCL 25 MG PO TABS
25.0000 mg | ORAL_TABLET | Freq: Three times a day (TID) | ORAL | Status: DC | PRN
  Administered 2020-08-13 – 2020-08-18 (×8): 25 mg via ORAL
  Filled 2020-08-13 (×8): qty 1

## 2020-08-13 NOTE — Progress Notes (Signed)
Orthopedic Tech Progress Note Patient Details:  Vanessa [name] 30-Mar-1956 [id number]  Ortho Devices Type of Ortho Device: Louretta Parma boot Ortho Device/Splint Location: ble Ortho Device/Splint Interventions: Ordered,Application,Adjustment   Post Interventions Patient Tolerated: Well Instructions Provided: Care of device,Poper ambulation with device   [name] 08/13/2020, 2:49 PM

## 2020-08-13 NOTE — Progress Notes (Signed)
[facility] ([facility]) Community Based Palliative Care       This patient has been referred to our palliative care services in the community.  [facility] will continue to follow for any discharge planning needs and to coordinate admission onto palliative care.    Thank you for the opportunity to participate in this patient's care.     Chrislyn King, BSN, RN [facility] Hospital Liaison   [phone] (24h on call) 

## 2020-08-13 NOTE — Progress Notes (Addendum)
Progress Note  Patient Name: Vanessa [name] Date of Encounter: 08/13/2020  [facility] Cardiologist: New to [facility]  Subjective   No acute overnight events. Patient reports her breathing is "not so good" this morning but better than when she came in. She still has significant lower extremity edema but she states this looks a little better as well. No chest pain or palpitations. She feels like she urinated a lot yesterday but only about 550 cc of urine output documented.  Inpatient Medications    Scheduled Meds: . aspirin  81 mg Oral Daily  . carvedilol  25 mg Oral BID WC  . ferrous sulfate  325 mg Oral BID WC  . furosemide  80 mg Intravenous BID  . heparin injection (subcutaneous)  5,000 Units Subcutaneous Q8H  . hydrALAZINE  25 mg Oral Q8H  . isosorbide mononitrate  15 mg Oral Daily  . metolazone  5 mg Oral Daily  . nutrition supplement (JUVEN)  1 packet Oral BID BM  . polyethylene glycol  17 g Oral Daily  . sodium bicarbonate  1,300 mg Oral BID WC   Continuous Infusions:  PRN Meds: acetaminophen, diphenhydrAMINE, guaiFENesin-dextromethorphan, hydrocortisone, hydrOXYzine, loratadine, melatonin, metoprolol tartrate, witch hazel-glycerin   Vital Signs    Vitals:   08/12/20 0900 08/12/20 1454 08/12/20 2349 08/13/20 0500  BP: (!) 127/94 (!) 133/96 (!) 128/92 (!) 135/92  Pulse: (!) 108 (!) 108 (!) 111 (!) 111  Resp: 17 18 17 16   Temp: 97.6 F (36.4 C) 98 F (36.7 C) 97.6 F (36.4 C) 97.6 F (36.4 C)  TempSrc: Axillary Oral Oral Oral  SpO2: 100%  100% 100%  Weight:      Height:        Intake/Output Summary (Last 24 hours) at 08/13/2020 0659 Last data filed at 08/13/2020 0500 Gross per 24 hour  Intake 420 ml  Output 551 ml  Net -131 ml   Last 3 Weights 08/11/2020 08/10/2020 08/08/2020  Weight (lbs) 225 lb 15.5 oz 226 lb 13.7 oz 253 lb 1.4 oz  Weight (kg) 102.5 kg 102.9 kg 114.8 kg      Telemetry    Likely atrial flutter given rates consistently in the 100's to  110's range. Looks like she briefly went into to sinus rhythm yesterday and rates were in the 60's at that time. - Personally Reviewed  ECG    No new ECG tracing yet. - Personally Reviewed  Physical Exam   GEN: Morbidly obese African-American female. No acute distress.   Neck: JVD elevated. Cardiac: Tachycardic with regularly rhythm. No murmurs, rubs, or gallops.  Respiratory: Decreased breath sounds in bases but no wheezes, rhonchi, or crackles appreciated. GI: Soft, mildly distended, and non-tender. MS: 2+ pitting edema of bilateral lower extremities. No deformity. Skin: Warm and dry. Neuro:  No focal deficits. Psych: Normal affect. Responds appropriately.  Labs    High Sensitivity Troponin:   Recent Labs  Lab 08/07/20 1736 08/07/20 2140  TROPONINIHS 47* 40*      Chemistry Recent Labs  Lab 08/07/20 1736 08/08/20 0044 08/11/20 0140 08/12/20 0237 08/13/20 0201  NA 142   < > 143 144 143  K 2.8*   < > 4.6 3.6 3.6  CL 102   < > 105 103 103  CO2 27   < > 24 29 28   GLUCOSE 90   < > 92 114* 145*  BUN 37*   < > 61* 70* 74*  CREATININE 3.18*   < > 3.81* 3.78*  3.62*  CALCIUM 7.7*   < > 8.0* 8.1* 8.0*  PROT 6.7  --   --   --   --   ALBUMIN 2.5*  --   --  2.3* 2.3*  AST 48*  --   --   --   --   ALT 60*  --   --   --   --   ALKPHOS 90  --   --   --   --   BILITOT 0.9  --   --   --   --   GFRNONAA 16*   < > 13* 13* 13*  ANIONGAP 13   < > 14 12 12    < > = values in this interval not displayed.     Hematology Recent Labs  Lab 08/07/20 1736 08/12/20 0237 08/12/20 0832 08/13/20 0201  WBC 6.1  --  6.0 4.9  RBC 3.01* 3.15* 3.18* 3.05*  HGB 8.8*  --  9.4* 9.0*  HCT 29.7*  --  30.9* 29.6*  MCV 98.7  --  97.2 97.0  MCH 29.2  --  29.6 29.5  MCHC 29.6*  --  30.4 30.4  RDW 17.7*  --  17.7* 17.8*  PLT 251  --  238 200    BNP Recent Labs  Lab 08/07/20 1736  BNP 2,227.0*     DDimer No results for input(s): DDIMER in the last 168 hours.   Radiology    No results  found.  Cardiac Studies   Echocardiogram 08/08/2020: Impressions: 1. Left ventricular ejection fraction, by estimation, is 35 to 40%. The  left ventricle has moderately decreased function. The left ventricle  demonstrates global hypokinesis. There is mild concentric left ventricular  hypertrophy. Indeterminate diastolic  filling due to E-A fusion. Elevated left atrial pressure.  2. Right ventricular systolic function is moderately reduced. Mildly  increased right ventricular wall thickness. There is moderately elevated  pulmonary artery systolic pressure. The estimated right ventricular  systolic pressure is 40.9 mmHg.  3. Left atrial size was mildly dilated.  4. The mitral valve is normal in structure. Trivial mitral valve  regurgitation.  5. Tricuspid valve regurgitation is moderate.  6. The aortic valve is normal in structure. Aortic valve regurgitation is  mild. No aortic stenosis is present.  7. The inferior vena cava is dilated in size with <50% respiratory  variability, suggesting right atrial pressure of 15 mmHg.   Comparison(s): Prior images reviewed side by side. The left ventricular  function is significantly worse.   Patient Profile   Vanessa [name] is a 65 y.o. female with a history of hypertension, hyperlipidemia, type 2 diabetes mellitus, endometrial cancer diagnosed in 2021, esophageal immobility, asthma, CKD stage III, and anemia of chronic disease who is being seen today for evaluation of acute systolic CH at the request of Dr. Olevia Bowens.  Assessment & Plan    Acute on Chronic Combined CHF - Patient presented with worsening shortness of breath and lower extremity edema and was noted to be volume overloaded on exam.  - BNP elevated at 2,227. - Chest x-ray consistent with vascular congestion with increased right pleural effusion and right basilar. - Echo showed LVEF of 35-405 with global hypokinesis, mild LVH, and indeterminate filling pressures with moderately  elevated PA pressures and moderate TR. Prior Echo in 09/2019 showed LVEF of 55-60%.  - She was diuresed with IV Lasix but had worsening renal function with creatinine peaking at 3.81 yesterday so diuretics were stopped. IV Lasix 80mg  twice daily  was restarted on 2/1 due to continued volume overload. Patient was also given a dose of Metolazone 5mg  on 2/1 and 2/2. Only 551cc of documented output yesterday. No updated weight since 2/1. Weight was 225 lbs at that time, down from 260 lbs on admission (unsure if this is accurate). Slight improvement in creatinine today at 3.62 (down from 3.78 yesterday). - She is still significantly volume overloaded on exam.  - Continue current dose of IV Lasix. May need another dose of Metolazone. Will defer to MD and Nephrology. - No ACEi/ARB/ARNI or MRA due to renal function. - Continue Coreg 25mg  twice daily. - Continue Hydralazine 25mg  three times daily and Imdur 15mg  daily.  - Continue to monitor daily weights, strict I/O's, and renal function. Will put in Care Order to make sure we are getting daily weights and strict I/O's.  New Onset Atrial Flutter - Still in atrial flutter with rates in the 110's.  - Continue Coreg 25mg  twice daily. - Rates reasonably well controlled. Consider Amiodarone if rates increase. - CHA2DS2-VASc = 5 (CHF, HTN, DM, age, female). Not a candidate for chronic anticoagulation due to endometrial bleeding secondary to endometrial cancer. Not a candidate for DCCV due to absence of anticoagulation. Not a candidate for Digoxin due to poor renal function.   Elevated Troponin - High-sensitivity troponin minimally elevated and flat at 47 >> 40. - No chest pain.  - Likely demand ischemia in setting of CHF and atrial flutter with RVR.  Hypertension - BP mildly elevated (mostly diastolic). - Per Nephrology, we want to make sure we keep kidney perfused and don't drop BP too low. Therefore, will continue current medications for now.  Acute on  CKD Stage III - Creatinine 2.48 on admission. Peaked at 3.81 on 2/1. Baseline around 2.0. - Creatinine stable at 3.62 today. - Renal ultrasound showed atrophic left kidney with normal appearing right kidney. - Urinalysis shows 100 of protein as well as small amount of RBC and WBC in addition to epithelial cells so difficult to interpret. - Nephrology consulted. CKD felt to be due to cardiorenal syndrome vs atrophic kidney causing decreased nephron mass in the setting of HTN and DM. Acute change felt to be secondary to hemodynamic changes.  - Management per Nephrology.  Of note, palliative care consulted and patient has agreed to outpatient palliative care to follow at SNF. However, she continued to want full code and full care at this time.  Otherwise, per primary team: - Type 2 diabetes - Endometrial cancer - Anemia of chronic disease  For questions or updates, please contact [facility] Please consult [email or web address] for contact info under     Signed, Darreld Mclean, PA-C  08/13/2020, 6:59 AM    The patient was seen, examined and discussed with Darreld Mclean, PA-C  and I agree with the above.   The patient has diuresed significant amount of fluid her weight is down from 117 to 102 kg, her creatinine is actually improving from 3.78 yesterday to 3.62 today, I would continue aggressive diuresis with Lasix 80 mg IV twice daily and metolazone 5 mg daily.  I would also order Unna boots.  Her blood pressure remains elevated I would increase Imdur to 30 mg daily.  Ena Dawley, MD 08/13/2020

## 2020-08-13 NOTE — Progress Notes (Addendum)
[facility] Progress Note   Vanessa [name] is a 65 y.o. female with a past medical history significant for HFpEF, endometrial cancer diagnosed in 2021, previously treated for COVID-19 in 2021, HTN, HLD, type II DM, CKD stage IV, anemia who present w/ complaints of worsening swelling of her legs and generalized weakness.  Assessment/ Plan:   1. AKI on CKD -creatinine slowly improving with diuresis 3.81> 3.78 > 3.62. Weight measurments shows drop from 117.9kg on admission to 102.5kg 2/1. No weight documented past two days. I's/O's with 550cc urine output documented 2/2-2/3.  Physical exam still shows patient significantly volume up but slightly improved from yesterday. -Patient has been on 80 mg Lasix IV twice daily as well as metolazone but is only putting off a small amount of fluid per documentation.  After discussion with attending we will increase Lasix to 160 mg IV twice daily and monitor I's and O's for tomorrow. -Daily weights -Strict I's and O's  2. Anemia of chronic disease: Hgb 9.0, Iron studies: Iron within normal limits, TIBC reduced at 249, saturation ratio increased at 48. Ferritin within normal limits. -Aranesp 100 mg  3. HTN: Blood pressure range 127-135/92-97 over last 24 hours.  Previously on hydralazine 25 mg every 8 hours, metolazone 5 mg daily, Lopressor 5 mg IV every 6 hours for heart rate greater than 110, carvedilol 25 mg twice daily. -Maintain blood pressure greater than 419 systolic for kidney profusion -We will hold hydralazine since we are increasing her Lasix.  4. Nutrition  Protein Calorie Malnutrition  Hypoalbuminemia: Albumin 2.3  Phos 5.2  Patient seen and examined, agree with above note with above modifications. 65 year od BF with cardiac pathology- biventricular CHF and CKD.  She was admitted for massive volume overload.  We have been attempting diuresis with high dose diuretics-  Possibly successful.  Will continue and actually max out  diuresis to lasix 160 q 12 and metolazone 5 and continue to follow- stop her low dose hydralazine to give [place] more BP to work with.  crt staying the same to possibly imroving some with treatment.  She is not uremic.  Will need nephrology follow up at discharge Corliss Parish, MD 08/13/2020      Subjective:   Patient states she didn't sleep well. She is unsure if she urinated much this morning and does not know how many times she went. She does believe her swelling has gone down from her perspective. Weights maybe looking promising from a diuresis perspective    Objective:   BP (!) 135/92 (BP Location: Left Arm)   Pulse (!) 111   Temp 97.6 F (36.4 C) (Oral)   Resp 16   Ht 4\' 11"  (1.499 m)   Wt 102.5 kg   LMP 03/17/2015   SpO2 100%   BMI 45.64 kg/m   Intake/Output Summary (Last 24 hours) at 08/13/2020 3790 Last data filed at 08/13/2020 0500 Gross per 24 hour  Intake 420 ml  Output 551 ml  Net -131 ml   Weight change:   Physical Exam: General: Alert and oriented in no apparent distress Heart: Tachycardic, no murmur, 1-2+ pitting edema up to level of lower abdominal wall Lungs: Faint crackles in bilateral bases, improved from yesterday. Continues on room air. Skin: Warm and dry  Imaging: No results found.  Labs: BMET Recent Labs  Lab 08/07/20 1736 08/08/20 0044 08/09/20 1021 08/10/20 0241 08/11/20 0140 08/12/20 0237 08/13/20 0201  NA 142 141 141 143 143 144 143  K  2.8* 3.2* 4.2 4.6 4.6 3.6 3.6  CL 102 105 105 105 105 103 103  CO2 27 25 23 24 24 29 28   GLUCOSE 90 104* 113* 126* 92 114* 145*  BUN 37* 36* 47* 51* 61* 70* 74*  CREATININE 3.18* 3.20* 3.35* 3.64* 3.81* 3.78* 3.62*  CALCIUM 7.7* 7.6* 7.7* 8.0* 8.0* 8.1* 8.0*  PHOS  --   --   --   --   --  5.2* 5.2*   CBC Recent Labs  Lab 08/07/20 1736 08/12/20 0832 08/13/20 0201  WBC 6.1 6.0 4.9  NEUTROABS 5.3  --   --   HGB 8.8* 9.4* 9.0*  HCT 29.7* 30.9* 29.6*  MCV 98.7 97.2 97.0  PLT 251 238 200     Medications:    . aspirin  81 mg Oral Daily  . carvedilol  25 mg Oral BID WC  . ferrous sulfate  325 mg Oral BID WC  . furosemide  80 mg Intravenous BID  . heparin injection (subcutaneous)  5,000 Units Subcutaneous Q8H  . hydrALAZINE  25 mg Oral Q8H  . isosorbide mononitrate  15 mg Oral Daily  . metolazone  5 mg Oral Daily  . nutrition supplement (JUVEN)  1 packet Oral BID BM  . polyethylene glycol  17 g Oral Daily  . sodium bicarbonate  1,300 mg Oral BID WC     Lurline Del, DO [facility] Resident, PGY-2 08/13/2020, 7:13 AM

## 2020-08-13 NOTE — Progress Notes (Addendum)
PROGRESS NOTE    Vanessa [name]  YDX:[id number] DOB: [birth year] DOA: 08/07/2020 PCP: Patient, No Pcp Per   Brief Narrative:  HPI on 08/07/2020 by Dr. Irene Pap Vanessa [name] is a 65 y.o. female with medical history significant for endometrial cancer diagnosed in 2021, previously treated COVID-19 viral pneumonia in 2021, MSSA bacteremia, essential hypertension, hyperlipidemia, esophageal dysmotility, type 2 diabetes, CKD 3, asthma, anemia of chronic disease who presented to [facility] ED due to worsening edema involving her lower extremities up to her abdomen.  States she ran out of her medications 2 weeks ago.  Associated with generalized weakness and shortness of breath.  Denies any chest pain.  Endorses that she has had a poor appetite.  Work-up in the ED revealed volume overload for which she was started on IV diuretics by EDP and Afib with RvR.  TRH was asked to admit.  Interim history Patient admitted with worsening lower extremity edema -acute combined systolic and diastolic heart failure.  Cardiology and nephrology consulted as patient continues to be on high-dose IV Lasix, in the setting of CKD, stage IV.  Also known to have new onset atrial fibrillation, although she is not a candidate for anticoagulation given her history of endometrial cancer and high risk of bleeding. Assessment & Plan   Acute combined systolic and diastolic heart failure -Presented with weakness and worsening swelling in her extremities up to her abdomen -BNP greater than 2200 -Echocardiogram shows an EF of 35 to 40%, left ventricle demonstrates global hypokinesis.  Indeterminate diastolic function.  RV systolic function mildly reduced, moderately elevated pulmonary artery systolic pressure, estimated right ventricular systolic pressure 72.0 mmHg. -Continue to monitor intake and output, daily weight -Nephrology increasing Lasix 160 mg IV twice daily -Continue metolazone 5 mg daily -Cardiology and nephrology consulted  and appreciated  New onset atrial fibrillation with RVR -Continue Coreg -Patient is not a candidate for anticoagulation due to history of endometrial cancer and high risk of bleeding  Elevated troponin  -Suspect secondary to demand ischemia in the setting of atrial fibrillation, chronic kidney disease, heart failure exacerbation -Echocardiogram showed no wall motion abnormalities  Acute kidney injury on chronic kidney disease, stage IV -On admission creatinine was 3.1 (baseline creatinine approximately 2.4) -Renal ultrasound showed no hydronephrosis, with an atrophic left kidney and her right kidney appears unremarkable -Nephrology consulted and appreciated -Continue with IV Lasix -Creatinine currently 3.62  Refractory hypokalemia -Likely secondary to diuretics -Potassium 3.6 -Continue to monitor BMP and replace as needed  Bilateral lower extremity edema -Secondary to volume overload -Continue treatment and plan as above  Anemia of chronic disease -Hemoglobin appears to be stable, no overt signs of bleeding  Essential hypertension -Hydralazine held as Lasix is being increased -Continue Coreg  Endometrial cancer -Patient was not a candidate for hysterectomy -Per chart review, goal therapy was palliative in order to control her bleeding -Palliative care consulted and appreciated  Severe morbid obesity -BMI 45.64  Deconditioning -PT recommending SNF  Goals of care -Palliative care consulted and appreciated, patient wishes to remain full code although she is considering changing her status depending on deterioration. -We will discharge to SNF with palliative care to follow -Patient does not want her spouse to be involved in her care however wants daughter to be her medical decision maker  Stage II sacral decubitus ulcer -Present on admission  Pruritus -Have placed patient on hydroxyzine 10 mg 3 times daily as needed -Will increase dose to 25 mg   DVT Prophylaxis  Heparin  Code Status: Full  Family Communication: none at bedside  Disposition Plan:  Status is: Inpatient  Remains inpatient appropriate because:IV treatments appropriate due to intensity of illness or inability to take PO   Dispo:  Patient From: Home  Planned Disposition: Home with Health Care Svc  Expected discharge date: 08/14/2020  Medically stable for discharge: No    Consultants Cardiology Nephrology  Procedures  Echocardiogram  Antibiotics   Anti-infectives (From admission, onward)   None      Subjective:   Vanessa [name] seen and examined today.  Patient feels that her itching has improved but continues to have itching and wonders if the medication can be increased.  Has some shortness of breath.  Denies chest pain, abdominal pain, nausea or vomiting, diarrhea or constipation, dizziness or headache.    Objective:   Vitals:   08/12/20 2349 08/13/20 0500 08/13/20 0853 08/13/20 0920  BP: (!) 128/92 (!) 135/92 120/85   Pulse: (!) 111 (!) 111 (!) 108   Resp: 17 16 16    Temp: 97.6 F (36.4 C) 97.6 F (36.4 C)    TempSrc: Oral Oral    SpO2: 100% 100% 100%   Weight:    100.2 kg  Height:        Intake/Output Summary (Last 24 hours) at 08/13/2020 1534 Last data filed at 08/13/2020 5093 Gross per 24 hour  Intake 420 ml  Output 401 ml  Net 19 ml   Filed Weights   08/10/20 0600 08/11/20 0500 08/13/20 0920  Weight: 102.9 kg 102.5 kg 100.2 kg   Exam  General: Well developed, chronically ill-appearing, NAD  HEENT: NCAT, mucous membranes moist.  Poor dentition  Cardiovascular: S1 S2 auscultated, tachycardic  Respiratory: Diminished breath sounds, fine crackles  Abdomen: Soft, nontender, nondistended, + bowel sounds  Extremities: warm dry without cyanosis clubbing.  2+ lower extremity pitting edema  Neuro: AAOx3, nonfocal  Psych: appropriate mood and affect, pleasant   Data Reviewed: I have personally reviewed following labs and imaging  studies  CBC: Recent Labs  Lab 08/07/20 1736 08/12/20 0832 08/13/20 0201  WBC 6.1 6.0 4.9  NEUTROABS 5.3  --   --   HGB 8.8* 9.4* 9.0*  HCT 29.7* 30.9* 29.6*  MCV 98.7 97.2 97.0  PLT 251 238 267   Basic Metabolic Panel: Recent Labs  Lab 08/07/20 1736 08/08/20 0044 08/09/20 1021 08/10/20 0241 08/11/20 0140 08/12/20 0237 08/13/20 0201  NA 142   < > 141 143 143 144 143  K 2.8*   < > 4.2 4.6 4.6 3.6 3.6  CL 102   < > 105 105 105 103 103  CO2 27   < > 23 24 24 29 28   GLUCOSE 90   < > 113* 126* 92 114* 145*  BUN 37*   < > 47* 51* 61* 70* 74*  CREATININE 3.18*   < > 3.35* 3.64* 3.81* 3.78* 3.62*  CALCIUM 7.7*   < > 7.7* 8.0* 8.0* 8.1* 8.0*  MG 2.2  --   --   --   --   --   --   PHOS  --   --   --   --   --  5.2* 5.2*   < > = values in this interval not displayed.   GFR: Estimated Creatinine Clearance: 16.1 mL/min (A) (by C-G formula based on SCr of 3.62 mg/dL (H)). Liver Function Tests: Recent Labs  Lab 08/07/20 1736 08/12/20 0237 08/13/20 0201  AST 48*  --   --  ALT 60*  --   --   ALKPHOS 90  --   --   BILITOT 0.9  --   --   PROT 6.7  --   --   ALBUMIN 2.5* 2.3* 2.3*   No results for input(s): LIPASE, AMYLASE in the last 168 hours. Recent Labs  Lab 08/07/20 1740  AMMONIA 26   Coagulation Profile: Recent Labs  Lab 08/07/20 1736  INR 1.3*   Cardiac Enzymes: No results for input(s): CKTOTAL, CKMB, CKMBINDEX, TROPONINI in the last 168 hours. BNP (last 3 results) No results for input(s): PROBNP in the last 8760 hours. HbA1C: No results for input(s): HGBA1C in the last 72 hours. CBG: No results for input(s): GLUCAP in the last 168 hours. Lipid Profile: No results for input(s): CHOL, HDL, LDLCALC, TRIG, CHOLHDL, LDLDIRECT in the last 72 hours. Thyroid Function Tests: No results for input(s): TSH, T4TOTAL, FREET4, T3FREE, THYROIDAB in the last 72 hours. Anemia Panel: Recent Labs    08/12/20 0237  VITAMINB12 1,406*  FOLATE 12.3  FERRITIN 63  TIBC  241*  IRON 115  RETICCTPCT 4.2*   Urine analysis:    Component Value Date/Time   COLORURINE YELLOW 08/12/2020 1155   APPEARANCEUR HAZY (A) 08/12/2020 1155   LABSPEC 1.013 08/12/2020 1155   PHURINE 5.0 08/12/2020 1155   GLUCOSEU NEGATIVE 08/12/2020 1155   HGBUR LARGE (A) 08/12/2020 1155   BILIRUBINUR NEGATIVE 08/12/2020 1155   KETONESUR NEGATIVE 08/12/2020 1155   PROTEINUR 100 (A) 08/12/2020 1155   UROBILINOGEN 0.2 03/06/2014 1644   NITRITE POSITIVE (A) 08/12/2020 1155   LEUKOCYTESUR TRACE (A) 08/12/2020 1155   Sepsis Labs: @LABRCNTIP (procalcitonin:4,lacticidven:4)  ) Recent Results (from the past 240 hour(s))  SARS Coronavirus 2 by RT PCR (hospital order, performed in [place] hospital lab) Nasopharyngeal Nasopharyngeal Swab     Status: None   Collection Time: 08/07/20  6:18 PM   Specimen: Nasopharyngeal Swab  Result Value Ref Range Status   SARS Coronavirus 2 NEGATIVE NEGATIVE Final    Comment: (NOTE) SARS-CoV-2 target nucleic acids are NOT DETECTED.  The SARS-CoV-2 RNA is generally detectable in upper and lower respiratory specimens during the acute phase of infection. The lowest concentration of SARS-CoV-2 viral copies this assay can detect is 250 copies / mL. A negative result does not preclude SARS-CoV-2 infection and should not be used as the sole basis for treatment or other patient management decisions.  A negative result may occur with improper specimen collection / handling, submission of specimen other than nasopharyngeal swab, presence of viral mutation(s) within the areas targeted by this assay, and inadequate number of viral copies (<250 copies / mL). A negative result must be combined with clinical observations, patient history, and epidemiological information.  Fact Sheet for Patients:   StrictlyIdeas.no  Fact Sheet for Healthcare Providers: BankingDealers.co.za  This test is not yet approved or   cleared by the [place] FDA and has been authorized for detection and/or diagnosis of SARS-CoV-2 by FDA under an Emergency Use Authorization (EUA).  This EUA will remain in effect (meaning this test can be used) for the duration of the COVID-19 declaration under Section 564(b)(1) of the Act, 21 U.S.C. section 360bbb-3(b)(1), unless the authorization is terminated or revoked sooner.  Performed at [facility], [place] [address] [place], [place] [address]   SARS CORONAVIRUS 2 (TAT 6-24 HRS)     Status: None   Collection Time: 08/12/20 11:45 AM  Result Value Ref Range Status   SARS Coronavirus 2  NEGATIVE NEGATIVE Final    Comment: (NOTE) SARS-CoV-2 target nucleic acids are NOT DETECTED.  The SARS-CoV-2 RNA is generally detectable in upper and lower respiratory specimens during the acute phase of infection. Negative results do not preclude SARS-CoV-2 infection, do not rule out co-infections with other pathogens, and should not be used as the sole basis for treatment or other patient management decisions. Negative results must be combined with clinical observations, patient history, and epidemiological information. The expected result is Negative.  Fact Sheet for Patients: SugarRoll.be  Fact Sheet for Healthcare Providers: https://www.woods-mathews.com/  This test is not yet approved or cleared by the [place] FDA and  has been authorized for detection and/or diagnosis of SARS-CoV-2 by FDA under an Emergency Use Authorization (EUA). This EUA will remain  in effect (meaning this test can be used) for the duration of the COVID-19 declaration under Se ction 564(b)(1) of the Act, 21 U.S.C. section 360bbb-3(b)(1), unless the authorization is terminated or revoked sooner.  Performed at [facility], [place] [address], [place], [place] [address]       Radiology Studies: No results found.   Scheduled Meds:  aspirin  81  mg Oral Daily   carvedilol  25 mg Oral BID WC   ferrous sulfate  325 mg Oral BID WC   heparin injection (subcutaneous)  5,000 Units Subcutaneous Q8H   [START ON 08/14/2020] isosorbide mononitrate  30 mg Oral Daily   metolazone  5 mg Oral Daily   nutrition supplement (JUVEN)  1 packet Oral BID BM   polyethylene glycol  17 g Oral Daily   sodium bicarbonate  1,300 mg Oral BID WC   Continuous Infusions:  furosemide 160 mg (08/13/20 1404)     LOS: 6 days   Time Spent in minutes   30 minutes  Kerby Borner D.O. on 08/13/2020 at 3:34 PM  Between 7am to 7pm - Please see pager noted on [email or web address]  After 7pm go to [email or web address]  And look for the night coverage person covering for me after hours  [facility] Office  [phone]

## 2020-08-13 NOTE — Evaluation (Signed)
Occupational Therapy Evaluation Patient Details Name: Vanessa [name] MRN: [id number] DOB: 11-Nov-1955 Today's Date: 08/13/2020    History of Present Illness 65 y.o. female past medical history significant for endometrial carcinoma diagnosed in 2021, COVID-19 viral pneumonia 2021, MSSA bacteremia, essential hypertension, diabetes mellitus type 2, chronic kidney disease stage III, chronic diastolic heart failure, anemia of chronic disease. Admitted with a. fib with RVR, acute diastolic heart failure, and hypokalemia.   Clinical Impression   Pt pleasant, but sleepy. Reports she was needing assist to stand from all surfaces and for LB ADL by her daughter. She walks with a RW. Pt presents with generalized weakness, impaired balance and decreased activity tolerance. She wants to be more independent in ADL to reduce the burden of care on her daughter.Recommending SNF for further rehab prior to return home. Will follow acutely.    Follow Up Recommendations  SNF;Supervision/Assistance - 24 hour    Equipment Recommendations  Wheelchair (measurements OT);Wheelchair cushion (measurements OT);Hospital bed;3 in 1 bedside commode    Recommendations for Other Services       Precautions / Restrictions Precautions Precautions: Fall      Mobility Bed Mobility               General bed mobility comments: Up in chair on arrival    Transfers Overall transfer level: Needs assistance Equipment used: Rolling walker (2 wheeled) Transfers: Sit to/from Stand Sit to Stand: Min assist         General transfer comment: assist to rise from recliner and BSC, pt reports needing help to stand up at home as her chair, bed and toilet are low    Balance Overall balance assessment: Needs assistance   Sitting balance-Leahy Scale: Fair     Standing balance support: Single extremity supported Standing balance-Leahy Scale: Poor Standing balance comment: leans, placing elbows on sink                            ADL either performed or assessed with clinical judgement   ADL Overall ADL's : Needs assistance/impaired Eating/Feeding: Independent;Sitting   Grooming: Wash/dry hands;Standing;Min guard Grooming Details (indicate cue type and reason): places elbows on sink Upper Body Bathing: Minimal assistance;Sitting   Lower Body Bathing: Maximal assistance;Sit to/from stand   Upper Body Dressing : Set up;Sitting   Lower Body Dressing: Maximal assistance;Sit to/from stand   Toilet Transfer: Min guard;Ambulation;RW   Toileting- Clothing Manipulation and Hygiene: Minimal assistance;Sit to/from stand       Functional mobility during ADLs: Min guard;Rolling walker General ADL Comments: pt is interested in AE for LB ADL     Vision         Perception     Praxis      Pertinent Vitals/Pain       Hand Dominance Right   Extremity/Trunk Assessment Upper Extremity Assessment Upper Extremity Assessment: Generalized weakness   Lower Extremity Assessment Lower Extremity Assessment: Defer to PT evaluation       Communication Communication Communication: No difficulties   Cognition Arousal/Alertness: Awake/alert Behavior During Therapy: WFL for tasks assessed/performed Overall Cognitive Status: Within Functional Limits for tasks assessed                                     General Comments       Exercises     Shoulder Instructions      Home  Living Family/patient expects to be discharged to:: Private residence Living Arrangements: Children Available Help at Discharge: Family;Available 24 hours/day (daughter is home) Type of Home: Apartment Home Access: Level entry     Home Layout: One level     Bathroom Shower/Tub: Tub/shower unit;Walk-in shower   Bathroom Toilet: Standard     Home Equipment: Environmental consultant - 2 wheels;Cane - single point;Bedside commode          Prior Functioning/Environment Level of Independence: Needs assistance   Gait / Transfers Assistance Needed: States she could walk with RW at home, short distances. ADL's / Homemaking Assistance Needed: daughter helps with LB ADL and all IADL            OT Problem List: Decreased strength;Impaired balance (sitting and/or standing);Decreased knowledge of use of DME or AE;Obesity;Cardiopulmonary status limiting activity      OT Treatment/Interventions: Self-care/ADL training;DME and/or AE instruction;Balance training;Patient/family education;Therapeutic activities;Energy conservation    OT Goals(Current goals can be found in the care plan section) Acute Rehab OT Goals Patient Stated Goal: Get her strength back OT Goal Formulation: With patient Time For Goal Achievement: 08/27/20 Potential to Achieve Goals: Fair ADL Goals Pt Will Perform Grooming: with supervision;standing (one hand on the sink for balance) Pt Will Perform Lower Body Bathing: with min assist;with adaptive equipment;sit to/from stand Pt Will Perform Lower Body Dressing: with min assist;with adaptive equipment;sit to/from stand Pt Will Transfer to Toilet: with supervision;ambulating;bedside commode Pt Will Perform Toileting - Clothing Manipulation and hygiene: with supervision;sit to/from stand Additional ADL Goal #1: Pt will state at least 3 energy conservation strategies as instructed.  OT Frequency: Min 2X/week   Barriers to D/C:            Co-evaluation              AM-PAC OT "6 Clicks" Daily Activity     Outcome Measure Help from another person eating meals?: None Help from another person taking care of personal grooming?: A Little Help from another person toileting, which includes using toliet, bedpan, or urinal?: A Little Help from another person bathing (including washing, rinsing, drying)?: A Lot Help from another person to put on and taking off regular upper body clothing?: A Little Help from another person to put on and taking off regular lower body clothing?: A  Lot 6 Click Score: 17   End of Session Equipment Utilized During Treatment: Gait belt;Rolling walker  Activity Tolerance: Patient limited by fatigue Patient left: in chair;with call bell/phone within reach;with chair alarm set  OT Visit Diagnosis: Unsteadiness on feet (R26.81);Other abnormalities of gait and mobility (R26.89);Muscle weakness (generalized) (M62.81)                Time: 4599-7741 OT Time Calculation (min): 13 min Charges:  OT General Charges $OT Visit: 1 Visit OT Evaluation $OT Eval Moderate Complexity: 1 Mod  Nestor Lewandowsky, OTR/L Acute Rehabilitation Services Pager: [phone] Office: [phone]  Malka So 08/13/2020, 2:35 PM

## 2020-08-14 DIAGNOSIS — I5033 Acute on chronic diastolic (congestive) heart failure: Secondary | ICD-10-CM | POA: Diagnosis not present

## 2020-08-14 DIAGNOSIS — I5031 Acute diastolic (congestive) heart failure: Secondary | ICD-10-CM | POA: Diagnosis not present

## 2020-08-14 DIAGNOSIS — N179 Acute kidney failure, unspecified: Secondary | ICD-10-CM | POA: Diagnosis not present

## 2020-08-14 DIAGNOSIS — J9601 Acute respiratory failure with hypoxia: Secondary | ICD-10-CM | POA: Diagnosis not present

## 2020-08-14 LAB — CBC
HCT: 30.4 % — ABNORMAL LOW (ref 36.0–46.0)
Hemoglobin: 9 g/dL — ABNORMAL LOW (ref 12.0–15.0)
MCH: 29.4 pg (ref 26.0–34.0)
MCHC: 29.6 g/dL — ABNORMAL LOW (ref 30.0–36.0)
MCV: 99.3 fL (ref 80.0–100.0)
Platelets: 192 10*3/uL (ref 150–400)
RBC: 3.06 MIL/uL — ABNORMAL LOW (ref 3.87–5.11)
RDW: 18.2 % — ABNORMAL HIGH (ref 11.5–15.5)
WBC: 4.9 10*3/uL (ref 4.0–10.5)
nRBC: 1.9 % — ABNORMAL HIGH (ref 0.0–0.2)

## 2020-08-14 LAB — RENAL FUNCTION PANEL
Albumin: 2.3 g/dL — ABNORMAL LOW (ref 3.5–5.0)
Anion gap: 13 (ref 5–15)
BUN: 77 mg/dL — ABNORMAL HIGH (ref 8–23)
CO2: 30 mmol/L (ref 22–32)
Calcium: 8.1 mg/dL — ABNORMAL LOW (ref 8.9–10.3)
Chloride: 101 mmol/L (ref 98–111)
Creatinine, Ser: 3.61 mg/dL — ABNORMAL HIGH (ref 0.44–1.00)
GFR, Estimated: 13 mL/min — ABNORMAL LOW (ref >60)
Glucose, Bld: 113 mg/dL — ABNORMAL HIGH (ref 70–99)
Phosphorus: 5.1 mg/dL — ABNORMAL HIGH (ref 2.5–4.6)
Potassium: 3.2 mmol/L — ABNORMAL LOW (ref 3.5–5.1)
Sodium: 144 mmol/L (ref 135–145)

## 2020-08-14 LAB — MAGNESIUM: Magnesium: 2.3 mg/dL (ref 1.7–2.4)

## 2020-08-14 MED ORDER — METOLAZONE 5 MG PO TABS
5.0000 mg | ORAL_TABLET | Freq: Two times a day (BID) | ORAL | Status: DC
  Administered 2020-08-14 – 2020-08-16 (×4): 5 mg via ORAL
  Filled 2020-08-14 (×5): qty 1

## 2020-08-14 MED ORDER — POTASSIUM CHLORIDE CRYS ER 20 MEQ PO TBCR
40.0000 meq | EXTENDED_RELEASE_TABLET | Freq: Two times a day (BID) | ORAL | Status: AC
  Administered 2020-08-14 (×2): 40 meq via ORAL
  Filled 2020-08-14 (×2): qty 2

## 2020-08-14 NOTE — Progress Notes (Addendum)
[facility] Progress Note   Vanessa [name] is a 65 y.o. female with a past medical history significant for HFpEF, endometrial cancer diagnosed in 2021, previously treated for COVID-19 in 2021, HTN, HLD, type II DM, CKD stage IV, anemia who present w/ complaints of worsening swelling of her legs and generalized weakness.  Assessment/ Plan:    1. AKI on CKD -creatinine slowly improving with diuresis 3.81> 3.78 > 3.62 >3.61. Baseline seems to be ~2.5. Bun 70>74>77. Weight measurments shows drop from 117.9kg on admission to 100.2kg 2/3, however question accuracy of the first 2 measurements.  If these first 2 measurements are excluded the patient dropped from 102.9 kg on 1/31 to 99.9 kg on 2/4.  I's/O's with 2x unmeasured urine output documented 2/3-2/4 after increasing lasix to 160mg  IV BID.  Physical exam shows improvement from yesterday with pitting edema no longer present in the abdominal wall, however she still has 1-2+ pitting edema up above the area of the knee. Patient has been on 80 mg Lasix IV twice daily as well as metolazone but was only putting off a small amount of fluid per documentation so Lasix was increased to 160mg  IV BID yesterday. Continues to have poor output per documentation and creatinine similar to yesterday.  Patient states she has urinated at least 4-5 times this morning.  She has 230cc in the container which the nurse states was changed earlier this morning. - We will increase metolazone to 5 mg twice daily. -Continue Lasix 160 mg IV twice daily -Daily weights -Strict I's and O's  2. Anemia of chronic disease: Hgb 9.0, Iron studies: Iron within normal limits, TIBC reduced at 249, saturation ratio increased at 48. Ferritin within normal limits. -Aranesp 100 mg  3. HTN: Blood pressure range 115-135/82-92 over last 24 hours.  Previously on hydralazine 25 mg every 8 hours, metolazone 5 mg daily, Lopressor 5 mg IV every 6 hours for heart rate greater than 110,  carvedilol 25 mg twice daily. -Maintain blood pressure greater than 297 systolic for kidney profusion -We will hold hydralazine since we are increasing her Lasix.  4. Nutrition  Protein Calorie Malnutrition  Hypoalbuminemia: Albumin 2.3  Phos 5.1  5. Hypokalemia: Potassium of 3.2 this AM after increasing Lasix. Will give 40kdur x 2 doses today. Will check AM RFP.  We will add on magnesium level.   Subjective:    Patient denies complaints this morning.  States she is urinated 4-5 times since waking up this morning.  She feels her swelling has improved.   Objective:   BP 112/87 (BP Location: Left Arm)   Pulse (!) 109   Temp 97.8 F (36.6 C) (Oral)   Resp 18   Ht 4\' 11"  (1.499 m)   Wt 100.2 kg   LMP 03/17/2015   SpO2 100%   BMI 44.64 kg/m   Intake/Output Summary (Last 24 hours) at 08/14/2020 0934 Last data filed at 08/14/2020 9892 Gross per 24 hour  Intake 597.55 ml  Output 200 ml  Net 397.55 ml   Weight change:   Physical Exam: General: Alert and oriented in no apparent distress Heart: Tachycardic rate with no murmurs appreciated.  1-2+ pitting edema noted above the area of the knee but no longer present in the abdominal wall, improved from yesterday. Lungs: CTA bilaterally, no crackles noted, improved from yesterday's exam. Skin: Warm and dry  Imaging: No results found.  Labs: BMET Recent Labs  Lab 08/08/20 0044 08/09/20 1021 08/10/20 0241 08/11/20 0140 08/12/20 1194  08/13/20 0201 08/14/20 0301  NA 141 141 143 143 144 143 144  K 3.2* 4.2 4.6 4.6 3.6 3.6 3.2*  CL 105 105 105 105 103 103 101  CO2 25 23 24 24 29 28 30   GLUCOSE 104* 113* 126* 92 114* 145* 113*  BUN 36* 47* 51* 61* 70* 74* 77*  CREATININE 3.20* 3.35* 3.64* 3.81* 3.78* 3.62* 3.61*  CALCIUM 7.6* 7.7* 8.0* 8.0* 8.1* 8.0* 8.1*  PHOS  --   --   --   --  5.2* 5.2* 5.1*   CBC Recent Labs  Lab 08/07/20 1736 08/12/20 0832 08/13/20 0201 08/14/20 0301  WBC 6.1 6.0 4.9 4.9  NEUTROABS 5.3  --   --    --   HGB 8.8* 9.4* 9.0* 9.0*  HCT 29.7* 30.9* 29.6* 30.4*  MCV 98.7 97.2 97.0 99.3  PLT 251 238 200 192    Medications:    . aspirin  81 mg Oral Daily  . carvedilol  25 mg Oral BID WC  . ferrous sulfate  325 mg Oral BID WC  . heparin injection (subcutaneous)  5,000 Units Subcutaneous Q8H  . isosorbide mononitrate  30 mg Oral Daily  . metolazone  5 mg Oral Daily  . nutrition supplement (JUVEN)  1 packet Oral BID BM  . polyethylene glycol  17 g Oral Daily  . potassium chloride  40 mEq Oral BID  . sodium bicarbonate  1,300 mg Oral BID WC     Lurline Del, DO [facility] Family Medicine Resident, PGY-2 08/14/2020, 9:34 AM

## 2020-08-14 NOTE — TOC Progression Note (Signed)
Transition of Care [facility]) - Progression Note    Patient Details  Name: Vanessa [name] MRN: [id number] Date of Birth: 1955/08/21  Transition of Care [facility]) CM/SW [place], [place] Phone Number: 08/14/2020, 1:19 PM  Clinical Narrative:     CSW called patients insurance to cancel insurance until patient is getting closer to being medically ready. CSW will need to resubmit clinicals to patients insurance when getting closer to dc. CSW will need to call Janie with [facility] to confirm if they can still offer patient a SNF bed when we get a good idea as to when patient will be medically ready.   CSW will continue to follow.    Expected Discharge Plan: Skilled Nursing Facility Barriers to Discharge: Continued Medical Work up,SNF Pending bed offer  Expected Discharge Plan and Services Expected Discharge Plan: [place] arrangements for the past 2 months: Mobile Home                                       Social Determinants of Health (SDOH) Interventions    Readmission Risk Interventions Readmission Risk Prevention Plan 11/08/2019 10/21/2019 09/25/2019  Transportation Screening Complete Complete Complete  PCP or Specialist Appt within 3-5 Days - - Not Complete  Not Complete comments - - plan for SNF  [place]RI or [place] - - Complete  Social Work Consult for [place] Planning/Counseling - - Complete  Palliative Care Screening - - Not Applicable  Medication Review Press photographer) Complete Referral to Pharmacy Referral to Pharmacy  PCP or Specialist appointment within 3-5 days of discharge Complete Complete -  [place] or Home Care Consult Complete (No Data) -  SW Recovery Care/Counseling Consult Complete Complete -  Palliative Care Screening Not Applicable Not Applicable -  Skilled Nursing Facility Complete Complete -  Some recent data might be hidden

## 2020-08-14 NOTE — Progress Notes (Signed)
PROGRESS NOTE    Vanessa [name]  YTK:[id number] DOB: [birth year] DOA: 08/07/2020 PCP: Patient, No Pcp Per   Brief Narrative:  HPI on 08/07/2020 by Dr. Irene Pap Bettey Muraoka is a 65 y.o. female with medical history significant for endometrial cancer diagnosed in 2021, previously treated COVID-19 viral pneumonia in 2021, MSSA bacteremia, essential hypertension, hyperlipidemia, esophageal dysmotility, type 2 diabetes, CKD 3, asthma, anemia of chronic disease who presented to [facility] ED due to worsening edema involving her lower extremities up to her abdomen.  States she ran out of her medications 2 weeks ago.  Associated with generalized weakness and shortness of breath.  Denies any chest pain.  Endorses that she has had a poor appetite.  Work-up in the ED revealed volume overload for which she was started on IV diuretics by EDP and Afib with RvR.  TRH was asked to admit.  Interim history Patient admitted with worsening lower extremity edema -acute combined systolic and diastolic heart failure.  Cardiology and nephrology consulted as patient continues to be on high-dose IV Lasix, in the setting of CKD, stage IV.  Also known to have new onset atrial fibrillation, although she is not a candidate for anticoagulation given her history of endometrial cancer and high risk of bleeding. Assessment & Plan   Acute combined systolic and diastolic heart failure -Presented with weakness and worsening swelling in her extremities up to her abdomen -BNP greater than 2200 -Echocardiogram shows an EF of 35 to 40%, left ventricle demonstrates global hypokinesis.  Indeterminate diastolic function.  RV systolic function mildly reduced, moderately elevated pulmonary artery systolic pressure, estimated right ventricular systolic pressure 75.1 mmHg. -Continue to monitor intake and output, daily weight -Nephrology increasing Lasix 160 mg IV twice daily -Continue metolazone 5 mg, however increased to twice  daily -Cardiology and nephrology consulted and appreciated  New onset atrial fibrillation with RVR -Continue Coreg -Patient is not a candidate for anticoagulation due to history of endometrial cancer and high risk of bleeding  Elevated troponin  -Suspect secondary to demand ischemia in the setting of atrial fibrillation, chronic kidney disease, heart failure exacerbation -Echocardiogram showed no wall motion abnormalities  Acute kidney injury on chronic kidney disease, stage IV -On admission creatinine was 3.1 (baseline creatinine approximately 2.4) -Renal ultrasound showed no hydronephrosis, with an atrophic left kidney and her right kidney appears unremarkable -Nephrology consulted and appreciated -Continue with IV Lasix -Creatinine currently 3.61  Refractory hypokalemia -Likely secondary to diuretics -Potassium 3.2 -Continue potassium supplementation  Bilateral lower extremity edema -Secondary to volume overload -Continue treatment and plan as above  Anemia of chronic disease -Hemoglobin appears to be stable, no overt signs of bleeding  Essential hypertension -Hydralazine held as Lasix is being increased -Continue Coreg  Endometrial cancer -Patient was not a candidate for hysterectomy -Per chart review, goal therapy was palliative in order to control her bleeding -Palliative care consulted and appreciated  Severe morbid obesity -BMI 45.64  Deconditioning -PT recommending SNF  Goals of care -Palliative care consulted and appreciated, patient wishes to remain full code although she is considering changing her status depending on deterioration. -We will discharge to SNF with palliative care to follow -Patient does not want her spouse to be involved in her care however wants daughter to be her medical decision maker  Stage II sacral decubitus ulcer -Present on admission  Pruritus -Improved with hydroxyzine  DVT Prophylaxis Heparin  Code Status: Full  Family  Communication: none at bedside  Disposition Plan:  Status is: Inpatient  Remains inpatient appropriate because:IV treatments appropriate due to intensity of illness or inability to take PO   Dispo:  Patient From: Home  Planned Disposition: Home with Health Care Svc  Expected discharge date: 08/17/2020  Medically stable for discharge: No    Consultants Cardiology Nephrology  Procedures  Echocardiogram  Antibiotics   Anti-infectives (From admission, onward)   None      Subjective:   Vanessa [name] seen and examined today.  Patient continues to have some itching but feels it is improved.  Has some shortness of breath.  Denies current chest pain, abdominal pain, nausea or vomiting, diarrhea or constipation, dizziness or headache.   Objective:   Vitals:   08/14/20 0337 08/14/20 0837 08/14/20 0939 08/14/20 1236  BP: 126/88 112/87  118/88  Pulse: (!) 112 (!) 109  (!) 107  Resp: 20 18  17   Temp: 98.2 F (36.8 C) 97.8 F (36.6 C)  98.7 F (37.1 C)  TempSrc: Oral Oral  Axillary  SpO2: 97% 100%  100%  Weight:   99.9 kg   Height:        Intake/Output Summary (Last 24 hours) at 08/14/2020 1551 Last data filed at 08/14/2020 1411 Gross per 24 hour  Intake 1310 ml  Output 420 ml  Net 890 ml   Filed Weights   08/11/20 0500 08/13/20 0920 08/14/20 0939  Weight: 102.5 kg 100.2 kg 99.9 kg   Exam  General: Well developed, chronically ill-appearing, NAD  HEENT: NCAT, mucous membranes moist.  Poor dentition  Cardiovascular: S1 S2 auscultated, tachycardic  Respiratory: Diminished breath sounds, fine crackles  Abdomen: Soft, nontender, nondistended, + bowel sounds  Extremities: warm dry without cyanosis clubbing.  2+ lower extremity pitting edema  Neuro: AAOx3, nonfocal  Psych: appropriate mood and affect, pleasant   Data Reviewed: I have personally reviewed following labs and imaging studies  CBC: Recent Labs  Lab 08/07/20 1736 08/12/20 0832 08/13/20 0201  08/14/20 0301  WBC 6.1 6.0 4.9 4.9  NEUTROABS 5.3  --   --   --   HGB 8.8* 9.4* 9.0* 9.0*  HCT 29.7* 30.9* 29.6* 30.4*  MCV 98.7 97.2 97.0 99.3  PLT 251 238 200 151   Basic Metabolic Panel: Recent Labs  Lab 08/07/20 1736 08/08/20 0044 08/10/20 0241 08/11/20 0140 08/12/20 0237 08/13/20 0201 08/14/20 0301  NA 142   < > 143 143 144 143 144  K 2.8*   < > 4.6 4.6 3.6 3.6 3.2*  CL 102   < > 105 105 103 103 101  CO2 27   < > 24 24 29 28 30   GLUCOSE 90   < > 126* 92 114* 145* 113*  BUN 37*   < > 51* 61* 70* 74* 77*  CREATININE 3.18*   < > 3.64* 3.81* 3.78* 3.62* 3.61*  CALCIUM 7.7*   < > 8.0* 8.0* 8.1* 8.0* 8.1*  MG 2.2  --   --   --   --   --  2.3  PHOS  --   --   --   --  5.2* 5.2* 5.1*   < > = values in this interval not displayed.   GFR: Estimated Creatinine Clearance: 16.2 mL/min (A) (by C-G formula based on SCr of 3.61 mg/dL (H)). Liver Function Tests: Recent Labs  Lab 08/07/20 1736 08/12/20 0237 08/13/20 0201 08/14/20 0301  AST 48*  --   --   --   ALT 60*  --   --   --  ALKPHOS 90  --   --   --   BILITOT 0.9  --   --   --   PROT 6.7  --   --   --   ALBUMIN 2.5* 2.3* 2.3* 2.3*   No results for input(s): LIPASE, AMYLASE in the last 168 hours. Recent Labs  Lab 08/07/20 1740  AMMONIA 26   Coagulation Profile: Recent Labs  Lab 08/07/20 1736  INR 1.3*   Cardiac Enzymes: No results for input(s): CKTOTAL, CKMB, CKMBINDEX, TROPONINI in the last 168 hours. BNP (last 3 results) No results for input(s): PROBNP in the last 8760 hours. HbA1C: No results for input(s): HGBA1C in the last 72 hours. CBG: No results for input(s): GLUCAP in the last 168 hours. Lipid Profile: No results for input(s): CHOL, HDL, LDLCALC, TRIG, CHOLHDL, LDLDIRECT in the last 72 hours. Thyroid Function Tests: No results for input(s): TSH, T4TOTAL, FREET4, T3FREE, THYROIDAB in the last 72 hours. Anemia Panel: Recent Labs    08/12/20 0237  VITAMINB12 1,406*  FOLATE 12.3  FERRITIN 63   TIBC 241*  IRON 115  RETICCTPCT 4.2*   Urine analysis:    Component Value Date/Time   COLORURINE YELLOW 08/12/2020 1155   APPEARANCEUR HAZY (A) 08/12/2020 1155   LABSPEC 1.013 08/12/2020 1155   PHURINE 5.0 08/12/2020 1155   GLUCOSEU NEGATIVE 08/12/2020 1155   HGBUR LARGE (A) 08/12/2020 1155   BILIRUBINUR NEGATIVE 08/12/2020 1155   KETONESUR NEGATIVE 08/12/2020 1155   PROTEINUR 100 (A) 08/12/2020 1155   UROBILINOGEN 0.2 03/06/2014 1644   NITRITE POSITIVE (A) 08/12/2020 1155   LEUKOCYTESUR TRACE (A) 08/12/2020 1155   Sepsis Labs: @LABRCNTIP (procalcitonin:4,lacticidven:4)  ) Recent Results (from the past 240 hour(s))  SARS Coronavirus 2 by RT PCR (hospital order, performed in [place] hospital lab) Nasopharyngeal Nasopharyngeal Swab     Status: None   Collection Time: 08/07/20  6:18 PM   Specimen: Nasopharyngeal Swab  Result Value Ref Range Status   SARS Coronavirus 2 NEGATIVE NEGATIVE Final    Comment: (NOTE) SARS-CoV-2 target nucleic acids are NOT DETECTED.  The SARS-CoV-2 RNA is generally detectable in upper and lower respiratory specimens during the acute phase of infection. The lowest concentration of SARS-CoV-2 viral copies this assay can detect is 250 copies / mL. A negative result does not preclude SARS-CoV-2 infection and should not be used as the sole basis for treatment or other patient management decisions.  A negative result may occur with improper specimen collection / handling, submission of specimen other than nasopharyngeal swab, presence of viral mutation(s) within the areas targeted by this assay, and inadequate number of viral copies (<250 copies / mL). A negative result must be combined with clinical observations, patient history, and epidemiological information.  Fact Sheet for Patients:   StrictlyIdeas.no  Fact Sheet for Healthcare Providers: BankingDealers.co.za  This test is not yet approved or   cleared by the [place] FDA and has been authorized for detection and/or diagnosis of SARS-CoV-2 by FDA under an Emergency Use Authorization (EUA).  This EUA will remain in effect (meaning this test can be used) for the duration of the COVID-19 declaration under Section 564(b)(1) of the Act, 21 U.S.C. section 360bbb-3(b)(1), unless the authorization is terminated or revoked sooner.  Performed at [facility], [place] [address], [place], [place] [address]   SARS CORONAVIRUS 2 (TAT 6-24 HRS)     Status: None   Collection Time: 08/12/20 11:45 AM  Result Value Ref Range Status   SARS Coronavirus  2 NEGATIVE NEGATIVE Final    Comment: (NOTE) SARS-CoV-2 target nucleic acids are NOT DETECTED.  The SARS-CoV-2 RNA is generally detectable in upper and lower respiratory specimens during the acute phase of infection. Negative results do not preclude SARS-CoV-2 infection, do not rule out co-infections with other pathogens, and should not be used as the sole basis for treatment or other patient management decisions. Negative results must be combined with clinical observations, patient history, and epidemiological information. The expected result is Negative.  Fact Sheet for Patients: SugarRoll.be  Fact Sheet for Healthcare Providers: https://www.woods-mathews.com/  This test is not yet approved or cleared by the [place] FDA and  has been authorized for detection and/or diagnosis of SARS-CoV-2 by FDA under an Emergency Use Authorization (EUA). This EUA will remain  in effect (meaning this test can be used) for the duration of the COVID-19 declaration under Se ction 564(b)(1) of the Act, 21 U.S.C. section 360bbb-3(b)(1), unless the authorization is terminated or revoked sooner.  Performed at [facility], [place] [address] [place], [place] [address]       Radiology Studies: No results found.   Scheduled Meds: . aspirin  81  mg Oral Daily  . carvedilol  25 mg Oral BID WC  . ferrous sulfate  325 mg Oral BID WC  . heparin injection (subcutaneous)  5,000 Units Subcutaneous Q8H  . isosorbide mononitrate  30 mg Oral Daily  . metolazone  5 mg Oral BID  . nutrition supplement (JUVEN)  1 packet Oral BID BM  . polyethylene glycol  17 g Oral Daily  . potassium chloride  40 mEq Oral BID  . sodium bicarbonate  1,300 mg Oral BID WC   Continuous Infusions: . furosemide 160 mg (08/14/20 0805)     LOS: 7 days   Time Spent in minutes   30 minutes  Vanessa [name] D.O. on 08/14/2020 at 3:51 PM  Between 7am to 7pm - Please see pager noted on [email or web address]  After 7pm go to [email or web address]  And look for the night coverage person covering for me after hours  [facility] Office  [phone]

## 2020-08-14 NOTE — Progress Notes (Signed)
The Pt. is working with PT at time of F/U visit. This chaplain will F/U with next step of notarizing Pt. HCPOA at another time.

## 2020-08-14 NOTE — Progress Notes (Signed)
Physical Therapy Treatment Patient Details Name: Vanessa [name] MRN: [id number] DOB: Aug 03, 1955 Today's Date: 08/14/2020    History of Present Illness 65 y.o. female past medical history significant for endometrial carcinoma diagnosed in 2021, COVID-19 viral pneumonia 2021, MSSA bacteremia, essential hypertension, diabetes mellitus type 2, chronic kidney disease stage III, chronic diastolic heart failure, anemia of chronic disease. Admitted with a. fib with RVR, acute diastolic heart failure, and hypokalemia.    PT Comments    Patient progressing slowly towards PT goals. Requires Mod A to stand from chair and Min guard assist for gait training with use of RW for support. Pt fatigues quickly and exhibits BLE weakness limiting distance. Tolerated there ex sitting in chair and encouraged increasing mobility esp over the weekend as able.  Reports some itching all over body; RN notified. Will continue to follow and progress as tolerated.   Follow Up Recommendations  SNF     Equipment Recommendations  None recommended by PT    Recommendations for Other Services       Precautions / Restrictions Precautions Precautions: Fall Restrictions Weight Bearing Restrictions: No    Mobility  Bed Mobility               General bed mobility comments: Up in chair on arrival  Transfers Overall transfer level: Needs assistance Equipment used: Rolling walker (2 wheeled) Transfers: Sit to/from Stand Sit to Stand: Mod assist         General transfer comment: Assist to power to standing with cues for hand placement/technique and use of momentum; stood from chair x1.  Ambulation/Gait Ambulation/Gait assistance: Min guard Gait Distance (Feet): 25 Feet Assistive device: Rolling walker (2 wheeled) Gait Pattern/deviations: Step-through pattern;Decreased stride length;Shuffle Gait velocity: decreased Gait velocity interpretation: <1.31 ft/sec, indicative of household ambulator General Gait  Details: Slow, guarded and mostly steady gait with RW for support; fatigues quickly. Decreased foot clearance bilaterally. VSS on RA.   Stairs             Wheelchair Mobility    Modified Rankin (Stroke Patients Only)       Balance Overall balance assessment: Needs assistance Sitting-balance support: Feet supported;No upper extremity supported Sitting balance-Leahy Scale: Fair Sitting balance - Comments: Prefers back support sitting in chair but able to sit unsupported for short periods.   Standing balance support: During functional activity Standing balance-Leahy Scale: Poor Standing balance comment: Requires UE support in standing.                            Cognition Arousal/Alertness: Awake/alert Behavior During Therapy: WFL for tasks assessed/performed Overall Cognitive Status: Within Functional Limits for tasks assessed                                        Exercises General Exercises - Lower Extremity Ankle Circles/Pumps: AROM;Both;10 reps;Seated Quad Sets: AROM;Both;10 reps;Seated    General Comments        Pertinent Vitals/Pain Pain Assessment: No/denies pain    Home Living                      Prior Function            PT Goals (current goals can now be found in the care plan section) Progress towards PT goals: Progressing toward goals    Frequency    Min  3X/week      PT Plan Current plan remains appropriate    Co-evaluation              AM-PAC PT "6 Clicks" Mobility   Outcome Measure  Help needed turning from your back to your side while in a flat bed without using bedrails?: A Little Help needed moving from lying on your back to sitting on the side of a flat bed without using bedrails?: A Little Help needed moving to and from a bed to a chair (including a wheelchair)?: A Lot Help needed standing up from a chair using your arms (e.g., wheelchair or bedside chair)?: A Lot Help needed to  walk in hospital room?: A Little Help needed climbing 3-5 steps with a railing? : Total 6 Click Score: 14    End of Session Equipment Utilized During Treatment: Gait belt Activity Tolerance: Patient tolerated treatment well;Patient limited by fatigue Patient left: in chair;with call bell/phone within reach Nurse Communication: Mobility status;Other (comment) (complaining of itching) PT Visit Diagnosis: Unsteadiness on feet (R26.81);Muscle weakness (generalized) (M62.81);Difficulty in walking, not elsewhere classified (R26.2)     Time: 1055-1110 PT Time Calculation (min) (ACUTE ONLY): 15 min  Charges:  $Therapeutic Activity: 8-22 mins                     Marisa Severin, PT, DPT Acute Rehabilitation Services Pager [phone] Office [place] 08/14/2020, 11:59 AM

## 2020-08-14 NOTE — Care Management Important Message (Signed)
Important Message  Patient Details  Name: Vanessa [name] MRN: [id number] Date of Birth: 10/01/1955   Medicare Important Message Given:  Yes     Shelda Altes 08/14/2020, 11:11 AM

## 2020-08-14 NOTE — Progress Notes (Addendum)
Progress Note  Patient Name: Vanessa [name] Date of Encounter: 08/14/2020  [facility] Cardiologist: New to [facility]  Subjective   No acute overnight events. Patient feels she is breathing better. She is unsure if she is back to baseline but states she is "doing good." No chest pain or palpitations. She still has significant lower extremity edema. Unna boots placed yesterday.  Inpatient Medications    Scheduled Meds: . aspirin  81 mg Oral Daily  . carvedilol  25 mg Oral BID WC  . ferrous sulfate  325 mg Oral BID WC  . heparin injection (subcutaneous)  5,000 Units Subcutaneous Q8H  . isosorbide mononitrate  30 mg Oral Daily  . metolazone  5 mg Oral Daily  . nutrition supplement (JUVEN)  1 packet Oral BID BM  . polyethylene glycol  17 g Oral Daily  . sodium bicarbonate  1,300 mg Oral BID WC   Continuous Infusions: . furosemide 160 mg (08/13/20 1404)   PRN Meds: acetaminophen, diphenhydrAMINE, guaiFENesin-dextromethorphan, hydrocortisone, hydrOXYzine, loratadine, melatonin, metoprolol tartrate, witch hazel-glycerin   Vital Signs    Vitals:   08/13/20 0920 08/13/20 2036 08/13/20 2345 08/14/20 0337  BP:  115/82 130/89 126/88  Pulse:  (!) 110 (!) 108 (!) 112  Resp:  20 18 20   Temp:  97.8 F (36.6 C) 97.8 F (36.6 C) 98.2 F (36.8 C)  TempSrc:  Oral Oral Oral  SpO2:  100% 99% 97%  Weight: 100.2 kg     Height:        Intake/Output Summary (Last 24 hours) at 08/14/2020 0740 Last data filed at 08/13/2020 1551 Gross per 24 hour  Intake 357.55 ml  Output --  Net 357.55 ml   Last 3 Weights 08/13/2020 08/11/2020 08/10/2020  Weight (lbs) 221 lb 225 lb 15.5 oz 226 lb 13.7 oz  Weight (kg) 100.245 kg 102.5 kg 102.9 kg      Telemetry    Atrial flutter vs. Sinus tachycardia. Suspect atrial flutter given rates staying consistently in the 100's to 110's. Will have brief episodes of clear sinus rhythm with rates around the 70's. - Personally Reviewed  ECG    No new ECG tracing  today. - Personally Reviewed  Physical Exam   GEN: Morbidly obese African-American female in no acute distress.   Neck: JVD elevated. Cardiac: Tachycardic with regularly rhythm. No murmurs, rubs, or gallops.  Respiratory: Mild decreased breath sounds in bases but no significant wheezes, rhonchi, or rales. GI: Soft, distended, and mildly tender. MS: 2+ pitting edema of bilateral lower extremities. No deformity. Skin: Warm and dry. Neuro:  No focal deficits. Psych: Normal affect.  Labs    High Sensitivity Troponin:   Recent Labs  Lab 08/07/20 1736 08/07/20 2140  TROPONINIHS 47* 40*      Chemistry Recent Labs  Lab 08/07/20 1736 08/08/20 0044 08/12/20 0237 08/13/20 0201 08/14/20 0301  NA 142   < > 144 143 144  K 2.8*   < > 3.6 3.6 3.2*  CL 102   < > 103 103 101  CO2 27   < > 29 28 30   GLUCOSE 90   < > 114* 145* 113*  BUN 37*   < > 70* 74* 77*  CREATININE 3.18*   < > 3.78* 3.62* 3.61*  CALCIUM 7.7*   < > 8.1* 8.0* 8.1*  PROT 6.7  --   --   --   --   ALBUMIN 2.5*  --  2.3* 2.3* 2.3*  AST 48*  --   --   --   --  ALT 60*  --   --   --   --   ALKPHOS 90  --   --   --   --   BILITOT 0.9  --   --   --   --   GFRNONAA 16*   < > 13* 13* 13*  ANIONGAP 13   < > 12 12 13    < > = values in this interval not displayed.     Hematology Recent Labs  Lab 08/12/20 0832 08/13/20 0201 08/14/20 0301  WBC 6.0 4.9 4.9  RBC 3.18* 3.05* 3.06*  HGB 9.4* 9.0* 9.0*  HCT 30.9* 29.6* 30.4*  MCV 97.2 97.0 99.3  MCH 29.6 29.5 29.4  MCHC 30.4 30.4 29.6*  RDW 17.7* 17.8* 18.2*  PLT 238 200 192    BNP Recent Labs  Lab 08/07/20 1736  BNP 2,227.0*     DDimer No results for input(s): DDIMER in the last 168 hours.   Radiology    No results found.  Cardiac Studies   Echocardiogram 08/08/2020: Impressions: 1. Left ventricular ejection fraction, by estimation, is 35 to 40%. The  left ventricle has moderately decreased function. The left ventricle  demonstrates global  hypokinesis. There is mild concentric left ventricular  hypertrophy. Indeterminate diastolic  filling due to E-A fusion. Elevated left atrial pressure.  2. Right ventricular systolic function is moderately reduced. Mildly  increased right ventricular wall thickness. There is moderately elevated  pulmonary artery systolic pressure. The estimated right ventricular  systolic pressure is 73.2 mmHg.  3. Left atrial size was mildly dilated.  4. The mitral valve is normal in structure. Trivial mitral valve  regurgitation.  5. Tricuspid valve regurgitation is moderate.  6. The aortic valve is normal in structure. Aortic valve regurgitation is  mild. No aortic stenosis is present.  7. The inferior vena cava is dilated in size with <50% respiratory  variability, suggesting right atrial pressure of 15 mmHg.   Comparison(s): Prior images reviewed side by side. The left ventricular  function is significantly worse.   Patient Profile     [name] is a 65 y.o.femalewith a history of hypertension, hyperlipidemia, type 2 diabetes mellitus, endometrial cancer diagnosed in 2021, esophageal immobility, asthma, CKD stage III, and anemia of chronic disease who is being seen today for evaluation of acute systolic CH at the request of Dr. Olevia Bowens.  Assessment & Plan    Acute on Chronic Combined CHF - Patient presented with worsening shortness of breath and lower extremity edema and was noted to be volume overloaded on exam.  - BNP elevated at 2,227. - Chest x-ray consistent with vascular congestion with increased right pleural effusion and right basilar. - Echo showed LVEF of 35-405 with global hypokinesis, mild LVH, and indeterminate filling pressures with moderately elevated PA pressures and moderate TR. RV systolic function moderately reduced. Prior Echo in 09/2019 showed LVEF of 55-60%.  - Nephrology increased IV Lasix to 160mg  twice daily. He is also on Metolazone 5mg  daily. No documented urinary  output yesterday due to problems with her purewick. Weight yesterday was 221 lbs, down from 260 lbs on admission (if accurate). Renal function stable and creatinine 3.61 today. - Still has significant lower extremity edema. -Will defer diuresis to Nephrology given renal function. - No ACEi/ARB/ARNI or MRA due to renal function. - Continue Coreg 25mg  twice daily. - Continue Imdur 30mg  daily. - Hydralazine was stopped yesterday by Nephrology to allow for more BP room since Lasix was increased. - Continue to  monitor daily weights, strict I/O's, and renal function.  New Onset Atrial Flutter -Still in atrial flutter with rates in the 110's. - Continue Coreg 25mg  twice daily. - CHA2DS2-VASc = 5 (CHF, HTN, DM, age, female). Not a candidate for chronic anticoagulation due to endometrial bleeding secondary to endometrial cancer. Not a candidate for DCCV due to absence of anticoagulation. Not a candidate for Digoxin due to poor renal function.   Elevated Troponin - High-sensitivity troponin minimally elevated and flat at 47 >> 40. - No chest pain.  - Likely demand ischemia in setting of CHF and atrial flutter with RVR.  Hypertension - BP well controlled. - Per Nephrology, we want to make sure we keep kidney perfused and don't drop BP too low. Hydralazine was stopped yesterday since Lasix was increased.  Acute on CKD Stage III - Creatinine2.48on admission. Peaked at 3.81 on 2/1. Baseline around 2.0. - Creatinine stable at 3.61 today. - Renal ultrasound showed atrophic left kidney with normal appearing right kidney. - Urinalysis shows 100 of protein as well as small amount of RBC and WBC in addition to epithelial cells so difficult to interpret. - Nephrology consulted. CKD felt to be due to cardiorenal syndrome vs atrophic kidney causing decreased nephron mass in the setting of HTN and DM. Acute change felt to be secondary to hemodynamic changes.  - Management per  Nephrology.  Hypokalemia - Potassium slightly low at 3.2 today.  - Will defer supplementation to Nephrology given renal function.  Of note, palliative care consulted and patient has agreed to outpatient palliative care to follow at SNF. However, she continued to want full code and full care at this time.  Otherwise, per primary team: - Type 2 diabetes - Endometrial cancer - Anemia of chronic disease  For questions or updates, please contact [facility] Please consult [email or web address] for contact info under     Signed, Darreld Mclean, PA-C  08/14/2020, 7:40 AM    The patient was seen, examined and discussed with Darreld Mclean, PA-C  and I agree with the above.   The patient is very slowly improving, weight down to 100 kg from 115 kg on admission, we appreciate nephrology input- they increased lasix to 160 mg iv BID today and kept metolazone 5 mg po daily, crea has been stable at 3.6, hydralazine was held in order to keep BP > 120 mmHg in order to keep good kidney perfusion. She remains in atrial flutter but with improved ventricular rates in 100-110 BPM. Continue carvedilol 25 mg po BID. We will sign off, we will arrange for a follow up in our cardiology clinic in 3 weeks. Please call us with any questions.   Ena Dawley, MD 08/14/2020

## 2020-08-14 NOTE — Progress Notes (Signed)
This chaplain is present at the Pt. bedside with the notary and 2 witnesses for notarizing of the Pt. Advance Directive: HCPOA and Living Will.   The Pt. is naming her daughter, Vanessa [name] as her Designer, television/film set.  The original Advance Directive and two copies were given to the Pt. The Pt. placed the documents in her bag.  The chaplain scanned a copy to the Pt. EMR.  This chaplain is available for F/U spiritual care as needed.

## 2020-08-15 DIAGNOSIS — J9601 Acute respiratory failure with hypoxia: Secondary | ICD-10-CM | POA: Diagnosis not present

## 2020-08-15 DIAGNOSIS — I5033 Acute on chronic diastolic (congestive) heart failure: Secondary | ICD-10-CM | POA: Diagnosis not present

## 2020-08-15 DIAGNOSIS — I5031 Acute diastolic (congestive) heart failure: Secondary | ICD-10-CM | POA: Diagnosis not present

## 2020-08-15 DIAGNOSIS — N179 Acute kidney failure, unspecified: Secondary | ICD-10-CM | POA: Diagnosis not present

## 2020-08-15 LAB — RENAL FUNCTION PANEL
Albumin: 2.3 g/dL — ABNORMAL LOW (ref 3.5–5.0)
Anion gap: 12 (ref 5–15)
BUN: 85 mg/dL — ABNORMAL HIGH (ref 8–23)
CO2: 32 mmol/L (ref 22–32)
Calcium: 8.2 mg/dL — ABNORMAL LOW (ref 8.9–10.3)
Chloride: 101 mmol/L (ref 98–111)
Creatinine, Ser: 3.75 mg/dL — ABNORMAL HIGH (ref 0.44–1.00)
GFR, Estimated: 13 mL/min — ABNORMAL LOW (ref >60)
Glucose, Bld: 101 mg/dL — ABNORMAL HIGH (ref 70–99)
Phosphorus: 5.2 mg/dL — ABNORMAL HIGH (ref 2.5–4.6)
Potassium: 3.7 mmol/L (ref 3.5–5.1)
Sodium: 145 mmol/L (ref 135–145)

## 2020-08-15 MED ORDER — ALBUMIN HUMAN 25 % IV SOLN
12.5000 g | Freq: Once | INTRAVENOUS | Status: AC
  Administered 2020-08-15: 12.5 g via INTRAVENOUS
  Filled 2020-08-15: qty 50

## 2020-08-15 MED ORDER — FUROSEMIDE 10 MG/ML IJ SOLN
80.0000 mg | Freq: Once | INTRAMUSCULAR | Status: AC
  Administered 2020-08-15: 80 mg via INTRAVENOUS
  Filled 2020-08-15: qty 8

## 2020-08-15 MED ORDER — ALBUMIN HUMAN 25 % IV SOLN
12.5000 g | Freq: Once | INTRAVENOUS | Status: AC
  Administered 2020-08-16: 12.5 g via INTRAVENOUS
  Filled 2020-08-15: qty 50

## 2020-08-15 MED ORDER — FUROSEMIDE 10 MG/ML IJ SOLN
80.0000 mg | Freq: Once | INTRAMUSCULAR | Status: AC
  Administered 2020-08-16: 80 mg via INTRAVENOUS
  Filled 2020-08-15: qty 8

## 2020-08-15 NOTE — TOC Progression Note (Signed)
Transition of Care [facility]) - Progression Note    Patient Details  Name: Taletha Twiford MRN: [id number] Date of Birth: 10-01-1955  Transition of Care [facility]) CM/SW [place], [place] Phone Number: [phone] 08/15/2020, 12:53 PM  Clinical Narrative:     CSW spoke with Janie at [facility] to check on bed availability when pt is medically ready for discharge. Janie informed CSW it would be pending bed availability to see if they could accept. She stated to call back once patient is closer to discharge.  TOC team will continue to assist with discharge planning needs.  Expected Discharge Plan: Skilled Nursing Facility Barriers to Discharge: Continued Medical Work up,SNF Pending bed offer  Expected Discharge Plan and Services Expected Discharge Plan: [place] arrangements for the past 2 months: Mobile Home                                       Social Determinants of Health (SDOH) Interventions    Readmission Risk Interventions Readmission Risk Prevention Plan 11/08/2019 10/21/2019 09/25/2019  Transportation Screening Complete Complete Complete  PCP or Specialist Appt within 3-5 Days - - Not Complete  Not Complete comments - - plan for SNF  [place]RI or [place] - - Complete  Social Work Consult for [place] Planning/Counseling - - Complete  Palliative Care Screening - - Not Applicable  Medication Review Press photographer) Complete Referral to Pharmacy Referral to Pharmacy  PCP or Specialist appointment within 3-5 days of discharge Complete Complete -  [place] or Home Care Consult Complete (No Data) -  SW Recovery Care/Counseling Consult Complete Complete -  Palliative Care Screening Not Applicable Not Applicable -  Skilled Nursing Facility Complete Complete -  Some recent data might be hidden

## 2020-08-15 NOTE — Progress Notes (Signed)
Patient ID: Vanessa [name], female   DOB: Nov 21, 1955, 65 y.o.   MRN: [id number] [facility] Progress Note   Assessment/ Plan:   1. Acute kidney Injury on chronic kidney disease stage IV: Creatinine essentially unchanged overnight with unimpressive response to diuresis.  She is currently on furosemide 160 mg IV twice daily and metolazone 5 mg twice daily-maintain current metolazone and switch furosemide to 80 mg IV 3 times daily with albumin on account of rising BUN (I recognize this is a lower dose of furosemide).  Clinically, she is doing well and does not have any acute electrolyte abnormality or uremic signs/symptoms to prompt indication of dialysis.  With evidence of contraction alkalosis evident on labs that may indicate she has intravascular volume contraction. 2.  Hypertension: Blood pressures remain marginally elevated, continue to monitor with adjustment of diuretic dosing. 3.  Anemia of chronic kidney disease: Iron stores within acceptable range, continue Aranesp.  No overt loss. 4.  Hyperphosphatemia: Mild, continue to monitor with labs and begin phosphorus binder if >5.5.  Subjective:   Reports to be feeling fair and denies any chest pain or shortness of breath.  Able to ambulate around the room with assistance.   Objective:   BP (!) 139/93 (BP Location: Left Arm)   Pulse (!) 112   Temp (!) 97.4 F (36.3 C) (Oral)   Resp 18   Ht 4\' 11"  (1.499 m)   Wt 99.9 kg   LMP 03/17/2015   SpO2 99%   BMI 44.48 kg/m   Intake/Output Summary (Last 24 hours) at 08/15/2020 0845 Last data filed at 08/14/2020 1411 Gross per 24 hour  Intake 950 ml  Output 420 ml  Net 530 ml   Weight change: -0.363 kg  Physical Exam: Gen: Comfortably sitting up in recliner, alert and oriented CVS: Pulse regular tachycardia, S1 and S2 normal and without any murmur/gallop Resp: Poor inspiratory effort with decreased breath sounds over bases, no distinct rales or rhonchi Abd: Soft, obese,  nontender, bowel sounds normal Ext: Legs in Unna boots, 1+ edema over knees with trace edema over upper extremities  Imaging: No results found.  Labs: BMET Recent Labs  Lab 08/09/20 1021 08/10/20 0241 08/11/20 0140 08/12/20 0237 08/13/20 0201 08/14/20 0301 08/15/20 0201  NA 141 143 143 144 143 144 145  K 4.2 4.6 4.6 3.6 3.6 3.2* 3.7  CL 105 105 105 103 103 101 101  CO2 23 24 24 29 28 30  32  GLUCOSE 113* 126* 92 114* 145* 113* 101*  BUN 47* 51* 61* 70* 74* 77* 85*  CREATININE 3.35* 3.64* 3.81* 3.78* 3.62* 3.61* 3.75*  CALCIUM 7.7* 8.0* 8.0* 8.1* 8.0* 8.1* 8.2*  PHOS  --   --   --  5.2* 5.2* 5.1* 5.2*   CBC Recent Labs  Lab 08/12/20 0832 08/13/20 0201 08/14/20 0301  WBC 6.0 4.9 4.9  HGB 9.4* 9.0* 9.0*  HCT 30.9* 29.6* 30.4*  MCV 97.2 97.0 99.3  PLT 238 200 192   Medications:    . aspirin  81 mg Oral Daily  . carvedilol  25 mg Oral BID WC  . ferrous sulfate  325 mg Oral BID WC  . heparin injection (subcutaneous)  5,000 Units Subcutaneous Q8H  . isosorbide mononitrate  30 mg Oral Daily  . metolazone  5 mg Oral BID  . nutrition supplement (JUVEN)  1 packet Oral BID BM  . polyethylene glycol  17 g Oral Daily  . sodium bicarbonate  1,300 mg Oral BID  WC   Elmarie Shiley, MD 08/15/2020, 8:45 AM

## 2020-08-15 NOTE — Progress Notes (Signed)
PROGRESS NOTE    Vanessa [name]  EZM:[id number] DOB: 08/01/1955 DOA: 08/07/2020 PCP: Patient, No Pcp Per   Brief Narrative:  HPI on 08/07/2020 by Dr. Irene Pap Vanessa [name] is a 65 y.o. female with medical history significant for endometrial cancer diagnosed in 2021, previously treated COVID-19 viral pneumonia in 2021, MSSA bacteremia, essential hypertension, hyperlipidemia, esophageal dysmotility, type 2 diabetes, CKD 3, asthma, anemia of chronic disease who presented to [facility] ED due to worsening edema involving her lower extremities up to her abdomen.  States she ran out of her medications 2 weeks ago.  Associated with generalized weakness and shortness of breath.  Denies any chest pain.  Endorses that she has had a poor appetite.  Work-up in the ED revealed volume overload for which she was started on IV diuretics by EDP and Afib with RvR.  TRH was asked to admit.  Interim history Patient admitted with worsening lower extremity edema -acute combined systolic and diastolic heart failure.  Cardiology and nephrology consulted as patient continues to be on high-dose IV Lasix, in the setting of CKD, stage IV.  Also known to have new onset atrial fibrillation, although she is not a candidate for anticoagulation given her history of endometrial cancer and high risk of bleeding. Assessment & Plan   Acute combined systolic and diastolic heart failure -Presented with weakness and worsening swelling in her extremities up to her abdomen -BNP greater than 2200 -Echocardiogram shows an EF of 35 to 40%, left ventricle demonstrates global hypokinesis.  Indeterminate diastolic function.  RV systolic function mildly reduced, moderately elevated pulmonary artery systolic pressure, estimated right ventricular systolic pressure 50.3 mmHg. -Continue to monitor intake and output, daily weight -Cardiology and nephrology consulted and appreciated -Patient was placed on IV Lasix 160 mg twice daily however now being  switched to 80 mg IV 3 times daily with albumin. -Continue metolazone 5 mg twice daily  New onset atrial fibrillation with RVR -Continue Coreg -Patient is not a candidate for anticoagulation due to history of endometrial cancer and high risk of bleeding  Elevated troponin  -Suspect secondary to demand ischemia in the setting of atrial fibrillation, chronic kidney disease, heart failure exacerbation -Echocardiogram showed no wall motion abnormalities  Acute kidney injury on chronic kidney disease, stage IV -On admission creatinine was 3.1 (baseline creatinine approximately 2.4) -Renal ultrasound showed no hydronephrosis, with an atrophic left kidney and her right kidney appears unremarkable -Nephrology consulted and appreciated -Continue with IV Lasix-as noted above. -Question if patient is now having contraction alkalosis -Creatinine currently 3.75  Refractory hypokalemia -Likely secondary to diuretics -Potassium 3.2 -Continue potassium supplementation  Bilateral lower extremity edema -Secondary to volume overload -Continue treatment and plan as above  Anemia of chronic disease -Hemoglobin appears to be stable, no overt signs of bleeding  Essential hypertension -Hydralazine held as Lasix is being increased -Continue Coreg  Endometrial cancer -Patient was not a candidate for hysterectomy -Per chart review, goal therapy was palliative in order to control her bleeding -Palliative care consulted and appreciated  Severe morbid obesity -BMI 45.64  Deconditioning -PT recommending SNF  Goals of care -Palliative care consulted and appreciated, patient wishes to remain full code although she is considering changing her status depending on deterioration. -Will discharge to SNF with palliative care to follow -Patient does not want her spouse to be involved in her care however wants daughter to be her medical decision maker  Stage II sacral decubitus ulcer -Present on  admission  Pruritus -Improved with hydroxyzine  Morbid obesity -BMI 44.48 -Patient will need to follow-up with her PCP for lifestyle modifications  DVT Prophylaxis Heparin  Code Status: Full  Family Communication: none at bedside  Disposition Plan:  Status is: Inpatient  Remains inpatient appropriate because:IV treatments appropriate due to intensity of illness or inability to take PO   Dispo:  Patient From: Home  Planned Disposition: Home with Health Care Svc  Expected discharge date: 08/17/2020  Medically stable for discharge: No    Consultants Cardiology Nephrology  Procedures  Echocardiogram  Antibiotics   Anti-infectives (From admission, onward)   None      Subjective:   Vanessa [name] seen and examined today.  Patient feels that her itching is well controlled now.  Continues to feel unable to sleep well at night.  Currently denies chest pain or shortness of breath.  Denies current abdominal pain, nausea or vomiting, dizziness or headache.  Objective:   Vitals:   08/14/20 1700 08/14/20 2020 08/14/20 2359 08/15/20 0803  BP: (!) 130/95 (!) 120/91 (!) 125/94 (!) 139/93  Pulse: (!) 109 (!) 112 (!) 112 (!) 112  Resp: 18 20 20 18   Temp: 98.1 F (36.7 C) 98.1 F (36.7 C)  (!) 97.4 F (36.3 C)  TempSrc: Axillary Oral  Oral  SpO2: 98% 99%  99%  Weight:      Height:        Intake/Output Summary (Last 24 hours) at 08/15/2020 1117 Last data filed at 08/14/2020 1411 Gross per 24 hour  Intake 350 ml  Output 220 ml  Net 130 ml   Filed Weights   08/11/20 0500 08/13/20 0920 08/14/20 0939  Weight: 102.5 kg 100.2 kg 99.9 kg   Exam  General: Well developed, chronically ill-appearing, NAD  HEENT: NCAT,  mucous membranes moist.  Poor dentition with  Cardiovascular: S1 S2 auscultated, tachycardic.  Respiratory: Diminished breath sounds however clear  Abdomen: Soft, obese, nontender, nondistended, + bowel sounds  Extremities: warm dry without cyanosis clubbing.   LE with Unna boots in place, also with pitting edema.  Neuro: AAOx3, nonfocal  Psych: appropriate mood and affect pleasant  Data Reviewed: I have personally reviewed following labs and imaging studies  CBC: Recent Labs  Lab 08/12/20 0832 08/13/20 0201 08/14/20 0301  WBC 6.0 4.9 4.9  HGB 9.4* 9.0* 9.0*  HCT 30.9* 29.6* 30.4*  MCV 97.2 97.0 99.3  PLT 238 200 539   Basic Metabolic Panel: Recent Labs  Lab 08/11/20 0140 08/12/20 0237 08/13/20 0201 08/14/20 0301 08/15/20 0201  NA 143 144 143 144 145  K 4.6 3.6 3.6 3.2* 3.7  CL 105 103 103 101 101  CO2 24 29 28 30  32  GLUCOSE 92 114* 145* 113* 101*  BUN 61* 70* 74* 77* 85*  CREATININE 3.81* 3.78* 3.62* 3.61* 3.75*  CALCIUM 8.0* 8.1* 8.0* 8.1* 8.2*  MG  --   --   --  2.3  --   PHOS  --  5.2* 5.2* 5.1* 5.2*   GFR: Estimated Creatinine Clearance: 15.6 mL/min (A) (by C-G formula based on SCr of 3.75 mg/dL (H)). Liver Function Tests: Recent Labs  Lab 08/12/20 0237 08/13/20 0201 08/14/20 0301 08/15/20 0201  ALBUMIN 2.3* 2.3* 2.3* 2.3*   No results for input(s): LIPASE, AMYLASE in the last 168 hours. No results for input(s): AMMONIA in the last 168 hours. Coagulation Profile: No results for input(s): INR, PROTIME in the last 168 hours. Cardiac Enzymes: No results for input(s): CKTOTAL, CKMB, CKMBINDEX, TROPONINI in the last 168 hours.  BNP (last 3 results) No results for input(s): PROBNP in the last 8760 hours. HbA1C: No results for input(s): HGBA1C in the last 72 hours. CBG: No results for input(s): GLUCAP in the last 168 hours. Lipid Profile: No results for input(s): CHOL, HDL, LDLCALC, TRIG, CHOLHDL, LDLDIRECT in the last 72 hours. Thyroid Function Tests: No results for input(s): TSH, T4TOTAL, FREET4, T3FREE, THYROIDAB in the last 72 hours. Anemia Panel: No results for input(s): VITAMINB12, FOLATE, FERRITIN, TIBC, IRON, RETICCTPCT in the last 72 hours. Urine analysis:    Component Value Date/Time    COLORURINE YELLOW 08/12/2020 1155   APPEARANCEUR HAZY (A) 08/12/2020 1155   LABSPEC 1.013 08/12/2020 1155   PHURINE 5.0 08/12/2020 1155   GLUCOSEU NEGATIVE 08/12/2020 1155   HGBUR LARGE (A) 08/12/2020 1155   BILIRUBINUR NEGATIVE 08/12/2020 1155   KETONESUR NEGATIVE 08/12/2020 1155   PROTEINUR 100 (A) 08/12/2020 1155   UROBILINOGEN 0.2 03/06/2014 1644   NITRITE POSITIVE (A) 08/12/2020 1155   LEUKOCYTESUR TRACE (A) 08/12/2020 1155   Sepsis Labs: @LABRCNTIP (procalcitonin:4,lacticidven:4)  ) Recent Results (from the past 240 hour(s))  SARS Coronavirus 2 by RT PCR (hospital order, performed in [place] hospital lab) Nasopharyngeal Nasopharyngeal Swab     Status: None   Collection Time: 08/07/20  6:18 PM   Specimen: Nasopharyngeal Swab  Result Value Ref Range Status   SARS Coronavirus 2 NEGATIVE NEGATIVE Final    Comment: (NOTE) SARS-CoV-2 target nucleic acids are NOT DETECTED.  The SARS-CoV-2 RNA is generally detectable in upper and lower respiratory specimens during the acute phase of infection. The lowest concentration of SARS-CoV-2 viral copies this assay can detect is 250 copies / mL. A negative result does not preclude SARS-CoV-2 infection and should not be used as the sole basis for treatment or other patient management decisions.  A negative result may occur with improper specimen collection / handling, submission of specimen other than nasopharyngeal swab, presence of viral mutation(s) within the areas targeted by this assay, and inadequate number of viral copies (<250 copies / mL). A negative result must be combined with clinical observations, patient history, and epidemiological information.  Fact Sheet for Patients:   StrictlyIdeas.no  Fact Sheet for Healthcare Providers: BankingDealers.co.za  This test is not yet approved or  cleared by the [place] FDA and has been authorized for detection and/or diagnosis of  SARS-CoV-2 by FDA under an Emergency Use Authorization (EUA).  This EUA will remain in effect (meaning this test can be used) for the duration of the COVID-19 declaration under Section 564(b)(1) of the Act, 21 U.S.C. section 360bbb-3(b)(1), unless the authorization is terminated or revoked sooner.  Performed at [facility], [place] [address] [place], [place] [address]   SARS CORONAVIRUS 2 (TAT 6-24 HRS)     Status: None   Collection Time: 08/12/20 11:45 AM  Result Value Ref Range Status   SARS Coronavirus 2 NEGATIVE NEGATIVE Final    Comment: (NOTE) SARS-CoV-2 target nucleic acids are NOT DETECTED.  The SARS-CoV-2 RNA is generally detectable in upper and lower respiratory specimens during the acute phase of infection. Negative results do not preclude SARS-CoV-2 infection, do not rule out co-infections with other pathogens, and should not be used as the sole basis for treatment or other patient management decisions. Negative results must be combined with clinical observations, patient history, and epidemiological information. The expected result is Negative.  Fact Sheet for Patients: SugarRoll.be  Fact Sheet for Healthcare Providers: https://www.woods-mathews.com/  This test is not yet approved or  cleared by the [place] and  has been authorized for detection and/or diagnosis of SARS-CoV-2 by FDA under an Emergency Use Authorization (EUA). This EUA will remain  in effect (meaning this test can be used) for the duration of the COVID-19 declaration under Se ction 564(b)(1) of the Act, 21 U.S.C. section 360bbb-3(b)(1), unless the authorization is terminated or revoked sooner.  Performed at [facility], [place] [address], [place], [place] [address]       Radiology Studies: No results found.   Scheduled Meds: . aspirin  81 mg Oral Daily  . carvedilol  25 mg Oral BID WC  . ferrous sulfate  325 mg Oral BID WC  .  [START ON 08/16/2020] furosemide  80 mg Intravenous Once  . furosemide  80 mg Intravenous Once  . furosemide  80 mg Intravenous Once  . heparin injection (subcutaneous)  5,000 Units Subcutaneous Q8H  . isosorbide mononitrate  30 mg Oral Daily  . metolazone  5 mg Oral BID  . nutrition supplement (JUVEN)  1 packet Oral BID BM  . polyethylene glycol  17 g Oral Daily  . sodium bicarbonate  1,300 mg Oral BID WC   Continuous Infusions: . [START ON 08/16/2020] albumin human    . albumin human    . albumin human       LOS: 8 days   Time Spent in minutes   30 minutes  Tushar Enns D.O. on 08/15/2020 at 11:17 AM  Between 7am to 7pm - Please see pager noted on [email or web address]  After 7pm go to [email or web address]  And look for the night coverage person covering for me after hours  [facility] Office  [phone]

## 2020-08-15 NOTE — Plan of Care (Signed)

## 2020-08-15 NOTE — Plan of Care (Signed)
  Problem: Education: Goal: Knowledge of General Education information will improve Description: Including pain rating scale, medication(s)/side effects and non-pharmacologic comfort measures 08/15/2020 0820 by Camillia Herter, RN Outcome: Progressing 08/15/2020 0817 by Camillia Herter, RN Outcome: Progressing   Problem: Health Behavior/Discharge Planning: Goal: Ability to manage health-related needs will improve 08/15/2020 0820 by Camillia Herter, RN Outcome: Progressing 08/15/2020 0817 by Camillia Herter, RN Outcome: Progressing   Problem: Clinical Measurements: Goal: Ability to maintain clinical measurements within normal limits will improve 08/15/2020 0820 by Camillia Herter, RN Outcome: Progressing 08/15/2020 0817 by Camillia Herter, RN Outcome: Progressing Goal: Will remain free from infection 08/15/2020 0820 by Camillia Herter, RN Outcome: Progressing 08/15/2020 0817 by Camillia Herter, RN Outcome: Progressing Goal: Diagnostic test results will improve 08/15/2020 0820 by Camillia Herter, RN Outcome: Progressing 08/15/2020 0817 by Camillia Herter, RN Outcome: Progressing Goal: Respiratory complications will improve 08/15/2020 0820 by Camillia Herter, RN Outcome: Progressing 08/15/2020 0817 by Camillia Herter, RN Outcome: Progressing Goal: Cardiovascular complication will be avoided 08/15/2020 0820 by Camillia Herter, RN Outcome: Progressing 08/15/2020 0817 by Camillia Herter, RN Outcome: Progressing   Problem: Activity: Goal: Risk for activity intolerance will decrease 08/15/2020 0820 by Camillia Herter, RN Outcome: Progressing 08/15/2020 0817 by Camillia Herter, RN Outcome: Progressing   Problem: Nutrition: Goal: Adequate nutrition will be maintained 08/15/2020 0820 by Camillia Herter, RN Outcome: Progressing 08/15/2020 0817 by Camillia Herter, RN Outcome: Progressing   Problem: Coping: Goal: Level of anxiety will decrease 08/15/2020 0820 by Camillia Herter, RN Outcome: Progressing 08/15/2020 0817 by Camillia Herter, RN Outcome: Progressing   Problem: Elimination: Goal: Will not experience complications related to bowel motility 08/15/2020 0820 by Camillia Herter, RN Outcome: Progressing 08/15/2020 0817 by Camillia Herter, RN Outcome: Progressing Goal: Will not experience complications related to urinary retention 08/15/2020 0820 by Camillia Herter, RN Outcome: Progressing 08/15/2020 0817 by Camillia Herter, RN Outcome: Progressing   Problem: Pain Managment: Goal: General experience of comfort will improve 08/15/2020 0820 by Camillia Herter, RN Outcome: Progressing 08/15/2020 0817 by Camillia Herter, RN Outcome: Progressing   Problem: Safety: Goal: Ability to remain free from injury will improve 08/15/2020 0820 by Camillia Herter, RN Outcome: Progressing 08/15/2020 0817 by Camillia Herter, RN Outcome: Progressing   Problem: Skin Integrity: Goal: Risk for impaired skin integrity will decrease 08/15/2020 0820 by Camillia Herter, RN Outcome: Progressing 08/15/2020 0817 by Camillia Herter, RN Outcome: Progressing

## 2020-08-16 DIAGNOSIS — J9601 Acute respiratory failure with hypoxia: Secondary | ICD-10-CM | POA: Diagnosis not present

## 2020-08-16 DIAGNOSIS — N179 Acute kidney failure, unspecified: Secondary | ICD-10-CM | POA: Diagnosis not present

## 2020-08-16 DIAGNOSIS — I5033 Acute on chronic diastolic (congestive) heart failure: Secondary | ICD-10-CM | POA: Diagnosis not present

## 2020-08-16 DIAGNOSIS — I5031 Acute diastolic (congestive) heart failure: Secondary | ICD-10-CM | POA: Diagnosis not present

## 2020-08-16 LAB — RENAL FUNCTION PANEL
Albumin: 2.7 g/dL — ABNORMAL LOW (ref 3.5–5.0)
Anion gap: 14 (ref 5–15)
BUN: 93 mg/dL — ABNORMAL HIGH (ref 8–23)
CO2: 29 mmol/L (ref 22–32)
Calcium: 8.3 mg/dL — ABNORMAL LOW (ref 8.9–10.3)
Chloride: 102 mmol/L (ref 98–111)
Creatinine, Ser: 3.71 mg/dL — ABNORMAL HIGH (ref 0.44–1.00)
GFR, Estimated: 13 mL/min — ABNORMAL LOW (ref >60)
Glucose, Bld: 115 mg/dL — ABNORMAL HIGH (ref 70–99)
Phosphorus: 5.4 mg/dL — ABNORMAL HIGH (ref 2.5–4.6)
Potassium: 3.6 mmol/L (ref 3.5–5.1)
Sodium: 145 mmol/L (ref 135–145)

## 2020-08-16 LAB — SARS CORONAVIRUS 2 (TAT 6-24 HRS): SARS Coronavirus 2: NEGATIVE

## 2020-08-16 MED ORDER — METOLAZONE 5 MG PO TABS
5.0000 mg | ORAL_TABLET | Freq: Every day | ORAL | Status: DC
  Administered 2020-08-17: 5 mg via ORAL
  Filled 2020-08-16: qty 1

## 2020-08-16 MED ORDER — NIFEDIPINE ER OSMOTIC RELEASE 30 MG PO TB24
30.0000 mg | ORAL_TABLET | Freq: Every day | ORAL | Status: DC
  Administered 2020-08-16 – 2020-08-17 (×2): 30 mg via ORAL
  Filled 2020-08-16 (×2): qty 1

## 2020-08-16 MED ORDER — METOLAZONE 5 MG PO TABS: 5.0000 mg | ORAL_TABLET | Freq: Every day | ORAL | Status: DC

## 2020-08-16 MED ORDER — TORSEMIDE 20 MG PO TABS
80.0000 mg | ORAL_TABLET | Freq: Two times a day (BID) | ORAL | Status: DC
  Administered 2020-08-16 – 2020-08-18 (×4): 80 mg via ORAL
  Filled 2020-08-16 (×4): qty 4

## 2020-08-16 NOTE — Progress Notes (Signed)
PROGRESS NOTE    Vanessa [name]  ZOX:[id number] DOB: 11-Aug-1955 DOA: 08/07/2020 PCP: Patient, No Pcp Per   Brief Narrative:  HPI on 08/07/2020 by Dr. Irene Pap Vanessa [name] is a 65 y.o. female with medical history significant for endometrial cancer diagnosed in 2021, previously treated COVID-19 viral pneumonia in 2021, MSSA bacteremia, essential hypertension, hyperlipidemia, esophageal dysmotility, type 2 diabetes, CKD 3, asthma, anemia of chronic disease who presented to [facility] ED due to worsening edema involving her lower extremities up to her abdomen.  States she ran out of her medications 2 weeks ago.  Associated with generalized weakness and shortness of breath.  Denies any chest pain.  Endorses that she has had a poor appetite.  Work-up in the ED revealed volume overload for which she was started on IV diuretics by EDP and Afib with RvR.  TRH was asked to admit.  Interim history Patient admitted with worsening lower extremity edema -acute combined systolic and diastolic heart failure.  Cardiology and nephrology consulted as patient continues to be on high-dose IV Lasix, in the setting of CKD, stage IV.  Also known to have new onset atrial fibrillation, although she is not a candidate for anticoagulation given her history of endometrial cancer and high risk of bleeding. Assessment & Plan   Acute combined systolic and diastolic heart failure -Presented with weakness and worsening swelling in her extremities up to her abdomen -BNP greater than 2200 -Echocardiogram shows an EF of 35 to 40%, left ventricle demonstrates global hypokinesis.  Indeterminate diastolic function.  RV systolic function mildly reduced, moderately elevated pulmonary artery systolic pressure, estimated right ventricular systolic pressure 81.1 mmHg. -Continue to monitor intake and output, daily weight -Cardiology and nephrology consulted and appreciated -Lasix regimen was switched to 160 mg twice daily then to 80 mg 3  times daily along with albumin. -Nephrology planning to transition to torsemide 80 mg twice daily today -Continue metolazone 5 mg -change from twice daily to daily  New onset atrial fibrillation with RVR -Continue Coreg -Patient is not a candidate for anticoagulation due to history of endometrial cancer and high risk of bleeding  Elevated troponin  -Suspect secondary to demand ischemia in the setting of atrial fibrillation, chronic kidney disease, heart failure exacerbation -Echocardiogram showed no wall motion abnormalities  Acute kidney injury on chronic kidney disease, stage IV -On admission creatinine was 3.1 (baseline creatinine approximately 2.4) -Renal ultrasound showed no hydronephrosis, with an atrophic left kidney and her right kidney appears unremarkable -Nephrology consulted and appreciated -Treatment plan as noted above -Question if patient is now having contraction alkalosis -Creatinine currently 3.71  Refractory hypokalemia -Likely secondary to diuretics -Potassium 3.6 -Continue potassium supplementation  Bilateral lower extremity edema -Secondary to volume overload -Continue treatment and plan as above  Anemia of chronic disease -Hemoglobin appears to be stable, no overt signs of bleeding  Essential hypertension -Hydralazine held as Lasix is being increased -Continue Coreg  Endometrial cancer -Patient was not a candidate for hysterectomy -Per chart review, goal therapy was palliative in order to control her bleeding -Palliative care consulted and appreciated  Severe morbid obesity -BMI 45.64  Deconditioning -PT recommending SNF  Goals of care -Palliative care consulted and appreciated, patient wishes to remain full code although she is considering changing her status depending on deterioration. -Will discharge to SNF with palliative care to follow -Patient does not want her spouse to be involved in her care however wants daughter to be her medical  decision maker  Stage II sacral  decubitus ulcer -Present on admission  Pruritus -Improved with hydroxyzine  Morbid obesity -BMI 44.48 -Patient will need to follow-up with her PCP for lifestyle modifications  DVT Prophylaxis Heparin  Code Status: Full  Family Communication: none at bedside  Disposition Plan:  Status is: Inpatient  Remains inpatient appropriate because:IV treatments appropriate due to intensity of illness or inability to take PO   Dispo:  Patient From: Home  Planned Disposition: SNF  Expected discharge date: 08/17/2020  Medically stable for discharge: No    Consultants Cardiology Nephrology  Procedures  Echocardiogram  Antibiotics   Anti-infectives (From admission, onward)   None      Subjective:   Vanessa [name] seen and examined today.  Patient feels her itching has improved.  States she did not sleep well last night.  Also feels that she is having bowel movements along with urination at the same time and this is hurting her bottom.  Currently she denies chest pain, shortness of breath, abdominal pain, nausea or vomiting, dizziness or headache.    Objective:   Vitals:   08/15/20 1959 08/16/20 0121 08/16/20 0727 08/16/20 0811  BP: (!) 140/105 122/69 (!) 154/77 (!) 178/92  Pulse: (!) 111 (!) 59 (!) 56 (!) 57  Resp: 17 18  18   Temp: 97.7 F (36.5 C) 97.8 F (36.6 C)  (!) 97.3 F (36.3 C)  TempSrc: Oral Oral  Oral  SpO2: 99% 98%    Weight:      Height:        Intake/Output Summary (Last 24 hours) at 08/16/2020 1128 Last data filed at 08/16/2020 0816 Gross per 24 hour  Intake 553.7 ml  Output 950 ml  Net -396.3 ml   Filed Weights   08/11/20 0500 08/13/20 0920 08/14/20 0939  Weight: 102.5 kg 100.2 kg 99.9 kg   Exam  General: Well developed, chronically ill-appearing, NAD  HEENT: NCAT,  mucous membranes moist.  Poor dentition   Cardiovascular: S1 S2 auscultated, RRR  Respiratory: Diminished breath sounds however clear  Abdomen:  Soft, obese, nontender, nondistended, + bowel sounds  Extremities: warm dry without cyanosis clubbing.  LE with Unna boots in place, also with edema in LE and UE, improving slightly  Neuro: AAOx3, nonfocal  Psych: appropriate mood and affect pleasant  Data Reviewed: I have personally reviewed following labs and imaging studies  CBC: Recent Labs  Lab 08/12/20 0832 08/13/20 0201 08/14/20 0301  WBC 6.0 4.9 4.9  HGB 9.4* 9.0* 9.0*  HCT 30.9* 29.6* 30.4*  MCV 97.2 97.0 99.3  PLT 238 200 025   Basic Metabolic Panel: Recent Labs  Lab 08/12/20 0237 08/13/20 0201 08/14/20 0301 08/15/20 0201 08/16/20 0310  NA 144 143 144 145 145  K 3.6 3.6 3.2* 3.7 3.6  CL 103 103 101 101 102  CO2 29 28 30  32 29  GLUCOSE 114* 145* 113* 101* 115*  BUN 70* 74* 77* 85* 93*  CREATININE 3.78* 3.62* 3.61* 3.75* 3.71*  CALCIUM 8.1* 8.0* 8.1* 8.2* 8.3*  MG  --   --  2.3  --   --   PHOS 5.2* 5.2* 5.1* 5.2* 5.4*   GFR: Estimated Creatinine Clearance: 15.7 mL/min (A) (by C-G formula based on SCr of 3.71 mg/dL (H)). Liver Function Tests: Recent Labs  Lab 08/12/20 0237 08/13/20 0201 08/14/20 0301 08/15/20 0201 08/16/20 0310  ALBUMIN 2.3* 2.3* 2.3* 2.3* 2.7*   No results for input(s): LIPASE, AMYLASE in the last 168 hours. No results for input(s): AMMONIA in the last 168  hours. Coagulation Profile: No results for input(s): INR, PROTIME in the last 168 hours. Cardiac Enzymes: No results for input(s): CKTOTAL, CKMB, CKMBINDEX, TROPONINI in the last 168 hours. BNP (last 3 results) No results for input(s): PROBNP in the last 8760 hours. HbA1C: No results for input(s): HGBA1C in the last 72 hours. CBG: No results for input(s): GLUCAP in the last 168 hours. Lipid Profile: No results for input(s): CHOL, HDL, LDLCALC, TRIG, CHOLHDL, LDLDIRECT in the last 72 hours. Thyroid Function Tests: No results for input(s): TSH, T4TOTAL, FREET4, T3FREE, THYROIDAB in the last 72 hours. Anemia Panel: No  results for input(s): VITAMINB12, FOLATE, FERRITIN, TIBC, IRON, RETICCTPCT in the last 72 hours. Urine analysis:    Component Value Date/Time   COLORURINE YELLOW 08/12/2020 1155   APPEARANCEUR HAZY (A) 08/12/2020 1155   LABSPEC 1.013 08/12/2020 1155   PHURINE 5.0 08/12/2020 1155   GLUCOSEU NEGATIVE 08/12/2020 1155   HGBUR LARGE (A) 08/12/2020 1155   BILIRUBINUR NEGATIVE 08/12/2020 1155   KETONESUR NEGATIVE 08/12/2020 1155   PROTEINUR 100 (A) 08/12/2020 1155   UROBILINOGEN 0.2 03/06/2014 1644   NITRITE POSITIVE (A) 08/12/2020 1155   LEUKOCYTESUR TRACE (A) 08/12/2020 1155   Sepsis Labs: @LABRCNTIP (procalcitonin:4,lacticidven:4)  ) Recent Results (from the past 240 hour(s))  SARS Coronavirus 2 by RT PCR (hospital order, performed in [place] hospital lab) Nasopharyngeal Nasopharyngeal Swab     Status: None   Collection Time: 08/07/20  6:18 PM   Specimen: Nasopharyngeal Swab  Result Value Ref Range Status   SARS Coronavirus 2 NEGATIVE NEGATIVE Final    Comment: (NOTE) SARS-CoV-2 target nucleic acids are NOT DETECTED.  The SARS-CoV-2 RNA is generally detectable in upper and lower respiratory specimens during the acute phase of infection. The lowest concentration of SARS-CoV-2 viral copies this assay can detect is 250 copies / mL. A negative result does not preclude SARS-CoV-2 infection and should not be used as the sole basis for treatment or other patient management decisions.  A negative result may occur with improper specimen collection / handling, submission of specimen other than nasopharyngeal swab, presence of viral mutation(s) within the areas targeted by this assay, and inadequate number of viral copies (<250 copies / mL). A negative result must be combined with clinical observations, patient history, and epidemiological information.  Fact Sheet for Patients:   StrictlyIdeas.no  Fact Sheet for Healthcare  Providers: BankingDealers.co.za  This test is not yet approved or  cleared by the [place] FDA and has been authorized for detection and/or diagnosis of SARS-CoV-2 by FDA under an Emergency Use Authorization (EUA).  This EUA will remain in effect (meaning this test can be used) for the duration of the COVID-19 declaration under Section 564(b)(1) of the Act, 21 U.S.C. section 360bbb-3(b)(1), unless the authorization is terminated or revoked sooner.  Performed at [facility], [place] [address], [place], [place] [address]   SARS CORONAVIRUS 2 (TAT 6-24 HRS)     Status: None   Collection Time: 08/12/20 11:45 AM  Result Value Ref Range Status   SARS Coronavirus 2 NEGATIVE NEGATIVE Final    Comment: (NOTE) SARS-CoV-2 target nucleic acids are NOT DETECTED.  The SARS-CoV-2 RNA is generally detectable in upper and lower respiratory specimens during the acute phase of infection. Negative results do not preclude SARS-CoV-2 infection, do not rule out co-infections with other pathogens, and should not be used as the sole basis for treatment or other patient management decisions. Negative results must be combined with clinical observations, patient history,  and epidemiological information. The expected result is Negative.  Fact Sheet for Patients: SugarRoll.be  Fact Sheet for Healthcare Providers: https://www.woods-mathews.com/  This test is not yet approved or cleared by the [place] FDA and  has been authorized for detection and/or diagnosis of SARS-CoV-2 by FDA under an Emergency Use Authorization (EUA). This EUA will remain  in effect (meaning this test can be used) for the duration of the COVID-19 declaration under Se ction 564(b)(1) of the Act, 21 U.S.C. section 360bbb-3(b)(1), unless the authorization is terminated or revoked sooner.  Performed at [facility], [place] [address], [place],  [place] [address]       Radiology Studies: No results found.   Scheduled Meds: . aspirin  81 mg Oral Daily  . carvedilol  25 mg Oral BID WC  . ferrous sulfate  325 mg Oral BID WC  . heparin injection (subcutaneous)  5,000 Units Subcutaneous Q8H  . isosorbide mononitrate  30 mg Oral Daily  . [START ON 08/17/2020] metolazone  5 mg Oral Daily  . NIFEdipine  30 mg Oral Daily  . nutrition supplement (JUVEN)  1 packet Oral BID BM  . polyethylene glycol  17 g Oral Daily  . torsemide  80 mg Oral BID   Continuous Infusions:    LOS: 9 days   Time Spent in minutes   30 minutes  Darrnell Mangiaracina D.O. on 08/16/2020 at 11:28 AM  Between 7am to 7pm - Please see pager noted on [email or web address]  After 7pm go to [email or web address]  And look for the night coverage person covering for me after hours  [facility] Office  [phone]

## 2020-08-16 NOTE — Progress Notes (Signed)
Patient ID: Vanessa [name], female   DOB: 18-Jan-1956, 65 y.o.   MRN: [id number] [facility] Progress Note   Assessment/ Plan:   1. Acute kidney Injury on chronic kidney disease stage IV: Creatinine essentially unchanged overnight with fixed response to IV diuresis; today we will switch her over to oral torsemide as clinically, she does not have any extremis from volume status.  She does not have any acute electrolyte abnormality or uremic signs/symptoms to prompt indication of dialysis.  Discontinue sodium bicarbonate supplementation. 2.  Hypertension: Blood pressures remain marginally elevated on carvedilol, isosorbide and ongoing diuresis.  I will add low-dose nifedipine ER and converted to oral diuretics. 3.  Anemia of chronic kidney disease: Iron stores within acceptable range, continue Aranesp.  No overt loss. 4.  Hyperphosphatemia: Mild, continue to monitor with labs and begin phosphorus binder if >5.5.  Subjective:   Denies any chest pain or shortness of breath and reports to be sleepy this morning   Objective:   BP (!) 178/92 (BP Location: Right Arm)   Pulse (!) 57   Temp (!) 97.3 F (36.3 C) (Oral)   Resp 18   Ht 4\' 11"  (1.499 m)   Wt 99.9 kg   LMP 03/17/2015   SpO2 98%   BMI 44.48 kg/m   Intake/Output Summary (Last 24 hours) at 08/16/2020 1014 Last data filed at 08/16/2020 0816 Gross per 24 hour  Intake 553.7 ml  Output 950 ml  Net -396.3 ml   Weight change:   Physical Exam: Gen: Comfortably sleeping in recliner, appropriate when awakened and engages in conversation CVS: Pulse regular tachycardia, S1 and S2 normal and without any murmur/gallop Resp: Poor inspiratory effort with decreased breath sounds over bases, no distinct rales or rhonchi Abd: Soft, obese, nontender, bowel sounds normal Ext: Legs in Unna boots, 1+ edema over knees with trace edema over upper extremities  Imaging: No results found.  Labs: BMET Recent Labs  Lab 08/10/20 0241  08/11/20 0140 08/12/20 0237 08/13/20 0201 08/14/20 0301 08/15/20 0201 08/16/20 0310  NA 143 143 144 143 144 145 145  K 4.6 4.6 3.6 3.6 3.2* 3.7 3.6  CL 105 105 103 103 101 101 102  CO2 24 24 29 28 30  32 29  GLUCOSE 126* 92 114* 145* 113* 101* 115*  BUN 51* 61* 70* 74* 77* 85* 93*  CREATININE 3.64* 3.81* 3.78* 3.62* 3.61* 3.75* 3.71*  CALCIUM 8.0* 8.0* 8.1* 8.0* 8.1* 8.2* 8.3*  PHOS  --   --  5.2* 5.2* 5.1* 5.2* 5.4*   CBC Recent Labs  Lab 08/12/20 0832 08/13/20 0201 08/14/20 0301  WBC 6.0 4.9 4.9  HGB 9.4* 9.0* 9.0*  HCT 30.9* 29.6* 30.4*  MCV 97.2 97.0 99.3  PLT 238 200 192   Medications:    . aspirin  81 mg Oral Daily  . carvedilol  25 mg Oral BID WC  . ferrous sulfate  325 mg Oral BID WC  . heparin injection (subcutaneous)  5,000 Units Subcutaneous Q8H  . isosorbide mononitrate  30 mg Oral Daily  . metolazone  5 mg Oral BID  . nutrition supplement (JUVEN)  1 packet Oral BID BM  . polyethylene glycol  17 g Oral Daily  . sodium bicarbonate  1,300 mg Oral BID WC   Elmarie Shiley, MD 08/16/2020, 10:14 AM

## 2020-08-16 NOTE — Plan of Care (Signed)

## 2020-08-16 NOTE — TOC Progression Note (Signed)
Transition of Care [facility]) - Progression Note    Patient Details  Name: Vanessa [name] MRN: [id number] Date of Birth: Aug 20, 1955  [facility] CM/SW [place], [place] Phone Number: 08/16/2020, 12:18 PM  Clinical Narrative:     CSW spoke with Narda Rutherford with [facility] who confirmed that she can accept patient for SNF placement if medically ready tomorrow. CSW started insurance authorization for patient. Reference number is #[id number]. CSW faxed over clinicals to patients insurance for review.  CSW will continue to follow.    Expected Discharge Plan: Skilled Nursing Facility Barriers to Discharge: Continued Medical Work up,SNF Pending bed offer  Expected Discharge Plan and Services Expected Discharge Plan: [place] arrangements for the past 2 months: Mobile Home                                       Social Determinants of Health (SDOH) Interventions    Readmission Risk Interventions Readmission Risk Prevention Plan 11/08/2019 10/21/2019 09/25/2019  Transportation Screening Complete Complete Complete  PCP or Specialist Appt within 3-5 Days - - Not Complete  Not Complete comments - - plan for SNF  [place] or [place] - - Complete  Social Work Consult for [place] Planning/Counseling - - Complete  Palliative Care Screening - - Not Applicable  Medication Review [employer] photographer) Complete Referral to Pharmacy Referral to Pharmacy  PCP or Specialist appointment within 3-5 days of discharge Complete Complete -  [place] or Home Care Consult Complete (No Data) -  SW Recovery Care/Counseling Consult Complete Complete -  Palliative Care Screening Not Applicable Not Applicable -  Skilled Nursing Facility Complete Complete -  Some recent data might be hidden

## 2020-08-17 DIAGNOSIS — I5033 Acute on chronic diastolic (congestive) heart failure: Secondary | ICD-10-CM | POA: Diagnosis not present

## 2020-08-17 DIAGNOSIS — J9601 Acute respiratory failure with hypoxia: Secondary | ICD-10-CM | POA: Diagnosis not present

## 2020-08-17 DIAGNOSIS — I5031 Acute diastolic (congestive) heart failure: Secondary | ICD-10-CM | POA: Diagnosis not present

## 2020-08-17 DIAGNOSIS — N179 Acute kidney failure, unspecified: Secondary | ICD-10-CM | POA: Diagnosis not present

## 2020-08-17 LAB — RENAL FUNCTION PANEL
Albumin: 2.7 g/dL — ABNORMAL LOW (ref 3.5–5.0)
Anion gap: 14 (ref 5–15)
BUN: 100 mg/dL — ABNORMAL HIGH (ref 8–23)
CO2: 31 mmol/L (ref 22–32)
Calcium: 8.3 mg/dL — ABNORMAL LOW (ref 8.9–10.3)
Chloride: 100 mmol/L (ref 98–111)
Creatinine, Ser: 3.65 mg/dL — ABNORMAL HIGH (ref 0.44–1.00)
GFR, Estimated: 13 mL/min — ABNORMAL LOW (ref >60)
Glucose, Bld: 142 mg/dL — ABNORMAL HIGH (ref 70–99)
Phosphorus: 5.5 mg/dL — ABNORMAL HIGH (ref 2.5–4.6)
Potassium: 3.4 mmol/L — ABNORMAL LOW (ref 3.5–5.1)
Sodium: 145 mmol/L (ref 135–145)

## 2020-08-17 MED ORDER — POTASSIUM CHLORIDE CRYS ER 20 MEQ PO TBCR
40.0000 meq | EXTENDED_RELEASE_TABLET | Freq: Once | ORAL | Status: AC
  Administered 2020-08-17: 40 meq via ORAL
  Filled 2020-08-17: qty 2

## 2020-08-17 MED ORDER — HYDRALAZINE HCL 25 MG PO TABS
25.0000 mg | ORAL_TABLET | Freq: Three times a day (TID) | ORAL | Status: DC
  Administered 2020-08-17 – 2020-08-18 (×4): 25 mg via ORAL
  Filled 2020-08-17 (×4): qty 1

## 2020-08-17 NOTE — Progress Notes (Signed)
PROGRESS NOTE    Zianna Dercole  VWP:[id number] DOB: 1956-03-07 DOA: 08/07/2020 PCP: Patient, No Pcp Per   Brief Narrative:  HPI on 08/07/2020 by Dr. Irene Pap Peyten Punches is a 65 y.o. female with medical history significant for endometrial cancer diagnosed in 2021, previously treated COVID-19 viral pneumonia in 2021, MSSA bacteremia, essential hypertension, hyperlipidemia, esophageal dysmotility, type 2 diabetes, CKD 3, asthma, anemia of chronic disease who presented to [facility] ED due to worsening edema involving her lower extremities up to her abdomen.  States she ran out of her medications 2 weeks ago.  Associated with generalized weakness and shortness of breath.  Denies any chest pain.  Endorses that she has had a poor appetite.  Work-up in the ED revealed volume overload for which she was started on IV diuretics by EDP and Afib with RvR.  TRH was asked to admit.  Interim history Patient admitted with worsening lower extremity edema -acute combined systolic and diastolic heart failure.  Cardiology and nephrology consulted as patient continues to be on high-dose IV Lasix, in the setting of CKD, stage IV.  Also known to have new onset atrial fibrillation, although she is not a candidate for anticoagulation given her history of endometrial cancer and high risk of bleeding. Assessment & Plan   Acute combined systolic and diastolic heart failure -Presented with weakness and worsening swelling in her extremities up to her abdomen -BNP greater than 2200 -Echocardiogram shows an EF of 35 to 40%, left ventricle demonstrates global hypokinesis.  Indeterminate diastolic function.  RV systolic function mildly reduced, moderately elevated pulmonary artery systolic pressure, estimated right ventricular systolic pressure 37.4 mmHg. -Continue to monitor intake and output, daily weight -Cardiology and nephrology consulted and appreciated -Lasix regimen was switched to 160 mg twice daily then to 80 mg 3  times daily along with albumin. -Nephrology planning to transition to torsemide 80 mg twice daily today -Discussed with nephrology, will discontinue metolazone.  Question whether patient is dry at this time.  New onset atrial fibrillation with RVR -Continue Coreg -Patient is not a candidate for anticoagulation due to history of endometrial cancer and high risk of bleeding  Elevated troponin  -Suspect secondary to demand ischemia in the setting of atrial fibrillation, chronic kidney disease, heart failure exacerbation -Echocardiogram showed no wall motion abnormalities  Acute kidney injury on chronic kidney disease, stage IV -On admission creatinine was 3.1 (baseline creatinine approximately 2.4) -Renal ultrasound showed no hydronephrosis, with an atrophic left kidney and her right kidney appears unremarkable -Nephrology consulted and appreciated -Treatment plan as noted above -Question if patient is now having contraction alkalosis -Creatinine currently 3.65  Refractory hypokalemia -Likely secondary to diuretics -Potassium 3.6 -Continue potassium supplementation  Bilateral lower extremity edema -Secondary to volume overload -Continue treatment and plan as above  Anemia of chronic disease -Hemoglobin appears to be stable, no overt signs of bleeding  Essential hypertension -Will resume hydralazine today -Continue Coreg  Endometrial cancer -Patient was not a candidate for hysterectomy -Per chart review, goal therapy was palliative in order to control her bleeding -Palliative care consulted and appreciated  Severe morbid obesity -BMI 45.64  Deconditioning -PT recommending SNF  Goals of care -Palliative care consulted and appreciated, patient wishes to remain full code although she is considering changing her status depending on deterioration. -Will discharge to SNF with palliative care to follow -Patient does not want her spouse to be involved in her care however wants  daughter to be her medical decision maker  Stage  II sacral decubitus ulcer -Present on admission  Pruritus -Improved with hydroxyzine  Morbid obesity -BMI 44.48 -Patient will need to follow-up with her PCP for lifestyle modifications  DVT Prophylaxis Heparin  Code Status: Full  Family Communication: none at bedside  Disposition Plan:  Status is: Inpatient  Remains inpatient appropriate because:IV treatments appropriate due to intensity of illness or inability to take PO   Dispo:  Patient From: Home  Planned Disposition: SNF  Expected discharge date: 08/17/2020  Medically stable for discharge: No    Consultants Cardiology Nephrology  Procedures  Echocardiogram  Antibiotics   Anti-infectives (From admission, onward)   None      Subjective:   [name] seen and examined today.  Feels itching is improved.  Denies current chest pain, shortness of breath, abdominal pain, nausea or vomiting, diarrhea or constipation.  Would like to have Unna boots removed.  Feels her leg swelling has improved.    Objective:   Vitals:   08/17/20 1030 08/17/20 1101 08/17/20 1131 08/17/20 1201  BP: (!) 138/107 (!) 145/115 (!) 168/106 (!) 168/108  Pulse: (!) 111 (!) 110    Resp: 18 18    Temp: (!) 97.2 F (36.2 C)     TempSrc: Oral     SpO2: 98%     Weight:      Height:        Intake/Output Summary (Last 24 hours) at 08/17/2020 1236 Last data filed at 08/17/2020 0900 Gross per 24 hour  Intake 480 ml  Output 800 ml  Net -320 ml   Filed Weights   08/13/20 0920 08/14/20 0939 08/17/20 0426  Weight: 100.2 kg 99.9 kg 98 kg   Exam  General: Well developed, chronically ill-appearing, NAD  HEENT: NCAT,  mucous membranes moist.  Poor dentition   Cardiovascular: S1 S2 auscultated, tachycardic  Respiratory: Diminished breath sounds however clear  Abdomen: Soft, obese, nontender, nondistended, + bowel sounds  Extremities: warm dry without cyanosis clubbing.  LE with Unna boots  in place B/L,  also with edema in LE and UE, improving   Neuro: AAOx3, nonfocal  Psych: appropriate mood and affect pleasant  Data Reviewed: I have personally reviewed following labs and imaging studies  CBC: Recent Labs  Lab 08/12/20 0832 08/13/20 0201 08/14/20 0301  WBC 6.0 4.9 4.9  HGB 9.4* 9.0* 9.0*  HCT 30.9* 29.6* 30.4*  MCV 97.2 97.0 99.3  PLT 238 200 944   Basic Metabolic Panel: Recent Labs  Lab 08/13/20 0201 08/14/20 0301 08/15/20 0201 08/16/20 0310 08/17/20 0133  NA 143 144 145 145 145  K 3.6 3.2* 3.7 3.6 3.4*  CL 103 101 101 102 100  CO2 28 30 32 29 31  GLUCOSE 145* 113* 101* 115* 142*  BUN 74* 77* 85* 93* 100*  CREATININE 3.62* 3.61* 3.75* 3.71* 3.65*  CALCIUM 8.0* 8.1* 8.2* 8.3* 8.3*  MG  --  2.3  --   --   --   PHOS 5.2* 5.1* 5.2* 5.4* 5.5*   GFR: Estimated Creatinine Clearance: 15.8 mL/min (A) (by C-G formula based on SCr of 3.65 mg/dL (H)). Liver Function Tests: Recent Labs  Lab 08/13/20 0201 08/14/20 0301 08/15/20 0201 08/16/20 0310 08/17/20 0133  ALBUMIN 2.3* 2.3* 2.3* 2.7* 2.7*   No results for input(s): LIPASE, AMYLASE in the last 168 hours. No results for input(s): AMMONIA in the last 168 hours. Coagulation Profile: No results for input(s): INR, PROTIME in the last 168 hours. Cardiac Enzymes: No results for input(s): CKTOTAL, CKMB,  CKMBINDEX, TROPONINI in the last 168 hours. BNP (last 3 results) No results for input(s): PROBNP in the last 8760 hours. HbA1C: No results for input(s): HGBA1C in the last 72 hours. CBG: No results for input(s): GLUCAP in the last 168 hours. Lipid Profile: No results for input(s): CHOL, HDL, LDLCALC, TRIG, CHOLHDL, LDLDIRECT in the last 72 hours. Thyroid Function Tests: No results for input(s): TSH, T4TOTAL, FREET4, T3FREE, THYROIDAB in the last 72 hours. Anemia Panel: No results for input(s): VITAMINB12, FOLATE, FERRITIN, TIBC, IRON, RETICCTPCT in the last 72 hours. Urine analysis:    Component  Value Date/Time   COLORURINE YELLOW 08/12/2020 1155   APPEARANCEUR HAZY (A) 08/12/2020 1155   LABSPEC 1.013 08/12/2020 1155   PHURINE 5.0 08/12/2020 1155   GLUCOSEU NEGATIVE 08/12/2020 1155   HGBUR LARGE (A) 08/12/2020 1155   BILIRUBINUR NEGATIVE 08/12/2020 1155   KETONESUR NEGATIVE 08/12/2020 1155   PROTEINUR 100 (A) 08/12/2020 1155   UROBILINOGEN 0.2 03/06/2014 1644   NITRITE POSITIVE (A) 08/12/2020 1155   LEUKOCYTESUR TRACE (A) 08/12/2020 1155   Sepsis Labs: @LABRCNTIP (procalcitonin:4,lacticidven:4)  ) Recent Results (from the past 240 hour(s))  SARS Coronavirus 2 by RT PCR (hospital order, performed in [place] hospital lab) Nasopharyngeal Nasopharyngeal Swab     Status: None   Collection Time: 08/07/20  6:18 PM   Specimen: Nasopharyngeal Swab  Result Value Ref Range Status   SARS Coronavirus 2 NEGATIVE NEGATIVE Final    Comment: (NOTE) SARS-CoV-2 target nucleic acids are NOT DETECTED.  The SARS-CoV-2 RNA is generally detectable in upper and lower respiratory specimens during the acute phase of infection. The lowest concentration of SARS-CoV-2 viral copies this assay can detect is 250 copies / mL. A negative result does not preclude SARS-CoV-2 infection and should not be used as the sole basis for treatment or other patient management decisions.  A negative result may occur with improper specimen collection / handling, submission of specimen other than nasopharyngeal swab, presence of viral mutation(s) within the areas targeted by this assay, and inadequate number of viral copies (<250 copies / mL). A negative result must be combined with clinical observations, patient history, and epidemiological information.  Fact Sheet for Patients:   StrictlyIdeas.no  Fact Sheet for Healthcare Providers: BankingDealers.co.za  This test is not yet approved or  cleared by the [place] FDA and has been authorized for detection  and/or diagnosis of SARS-CoV-2 by FDA under an Emergency Use Authorization (EUA).  This EUA will remain in effect (meaning this test can be used) for the duration of the COVID-19 declaration under Section 564(b)(1) of the Act, 21 U.S.C. section 360bbb-3(b)(1), unless the authorization is terminated or revoked sooner.  Performed at [facility], [place] [address], [place], [place] [address]   SARS CORONAVIRUS 2 (TAT 6-24 HRS)     Status: None   Collection Time: 08/12/20 11:45 AM  Result Value Ref Range Status   SARS Coronavirus 2 NEGATIVE NEGATIVE Final    Comment: (NOTE) SARS-CoV-2 target nucleic acids are NOT DETECTED.  The SARS-CoV-2 RNA is generally detectable in upper and lower respiratory specimens during the acute phase of infection. Negative results do not preclude SARS-CoV-2 infection, do not rule out co-infections with other pathogens, and should not be used as the sole basis for treatment or other patient management decisions. Negative results must be combined with clinical observations, patient history, and epidemiological information. The expected result is Negative.  Fact Sheet for Patients: SugarRoll.be  Fact Sheet for Healthcare Providers: https://www.woods-mathews.com/  This test is not yet approved or cleared by the [place] and  has been authorized for detection and/or diagnosis of SARS-CoV-2 by FDA under an Emergency Use Authorization (EUA). This EUA will remain  in effect (meaning this test can be used) for the duration of the COVID-19 declaration under Se ction 564(b)(1) of the Act, 21 U.S.C. section 360bbb-3(b)(1), unless the authorization is terminated or revoked sooner.  Performed at [facility], [place] [address] [place], [place] [address]   SARS CORONAVIRUS 2 (TAT 6-24 HRS) Nasopharyngeal Nasopharyngeal Swab     Status: None   Collection Time: 08/16/20 12:21 PM   Specimen: Nasopharyngeal  Swab  Result Value Ref Range Status   SARS Coronavirus 2 NEGATIVE NEGATIVE Final    Comment: (NOTE) SARS-CoV-2 target nucleic acids are NOT DETECTED.  The SARS-CoV-2 RNA is generally detectable in upper and lower respiratory specimens during the acute phase of infection. Negative results do not preclude SARS-CoV-2 infection, do not rule out co-infections with other pathogens, and should not be used as the sole basis for treatment or other patient management decisions. Negative results must be combined with clinical observations, patient history, and epidemiological information. The expected result is Negative.  Fact Sheet for Patients: SugarRoll.be  Fact Sheet for Healthcare Providers: https://www.woods-mathews.com/  This test is not yet approved or cleared by the [place] FDA and  has been authorized for detection and/or diagnosis of SARS-CoV-2 by FDA under an Emergency Use Authorization (EUA). This EUA will remain  in effect (meaning this test can be used) for the duration of the COVID-19 declaration under Se ction 564(b)(1) of the Act, 21 U.S.C. section 360bbb-3(b)(1), unless the authorization is terminated or revoked sooner.  Performed at [facility], [place] [address], [place], [place] [address]       Radiology Studies: No results found.   Scheduled Meds: . aspirin  81 mg Oral Daily  . carvedilol  25 mg Oral BID WC  . ferrous sulfate  325 mg Oral BID WC  . heparin injection (subcutaneous)  5,000 Units Subcutaneous Q8H  . hydrALAZINE  25 mg Oral Q8H  . isosorbide mononitrate  30 mg Oral Daily  . nutrition supplement (JUVEN)  1 packet Oral BID BM  . polyethylene glycol  17 g Oral Daily  . torsemide  80 mg Oral BID   Continuous Infusions:    LOS: 10 days   Time Spent in minutes   30 minutes  Kiley Torrence D.O. on 08/17/2020 at 12:36 PM  Between 7am to 7pm - Please see pager noted on [email or web address]  After 7pm go  to [email or web address]  And look for the night coverage person covering for me after hours  [facility] Office  [phone]

## 2020-08-17 NOTE — Progress Notes (Signed)
Physical Therapy Treatment Patient Details Name: Vanessa [name] MRN: [id number] DOB: [birth year] Today's Date: 08/17/2020    History of Present Illness 65 y.o. female past medical history significant for endometrial carcinoma diagnosed in 2021, COVID-19 viral pneumonia 2021, MSSA bacteremia, essential hypertension, diabetes mellitus type 2, chronic kidney disease stage III, chronic diastolic heart failure, anemia of chronic disease. Admitted with a. fib with RVR, acute diastolic heart failure, and hypokalemia.    PT Comments    Pt was up to side of bed in chair and then walked with PT after transfer practice to [facility].  Her control of balance and initial standing are impacted by her ROM and postural control.  Noted her hip ext and mm control of hips are primary issues to continue to address for the recovery of stable standing, but will be ongoing, certainly to continue in rehab setting.  Follow for goals of PT acutely.  Follow Up Recommendations  SNF     Equipment Recommendations  None recommended by PT    Recommendations for Other Services       Precautions / Restrictions Precautions Precautions: Fall Precaution Comments: monitor her for balance in transition to stand and sit Restrictions Weight Bearing Restrictions: No    Mobility  Bed Mobility Overal bed mobility: Needs Assistance             General bed mobility comments: up in chair when PT arrived  Transfers Overall transfer level: Needs assistance Equipment used: Rolling walker (2 wheeled) Transfers: Sit to/from Stand Sit to Stand: Min assist         General transfer comment: min assist to power up  Ambulation/Gait Ambulation/Gait assistance: Min assist Gait Distance (Feet): 25 Feet Assistive device: Rolling walker (2 wheeled) Gait Pattern/deviations: Step-through pattern;Decreased stride length;Shuffle;Wide base of support;Trunk flexed Gait velocity: decreased   General Gait Details: tends to flex posture  to accommodate her joints   Stairs             Wheelchair Mobility    Modified Rankin (Stroke Patients Only)       Balance Overall balance assessment: Needs assistance Sitting-balance support: Feet supported Sitting balance-Leahy Scale: Fair     Standing balance support: Bilateral upper extremity supported;During functional activity Standing balance-Leahy Scale: Poor Standing balance comment: flexed postural support of UE's                            Cognition Arousal/Alertness: Awake/alert Behavior During Therapy: WFL for tasks assessed/performed Overall Cognitive Status: Within Functional Limits for tasks assessed                                        Exercises      General Comments General comments (skin integrity, edema, etc.): Pt is considering longer walking distances but is not able to get much farther than bed to the door, although she could push more with appropriate footwear adn close guard of chair      Pertinent Vitals/Pain Pain Assessment: Faces Faces Pain Scale: Hurts a little bit Pain Location: generalized discomfort Pain Intervention(s): Repositioned    Home Living                      Prior Function            PT Goals (current goals can now be found in the care  plan section) Acute Rehab PT Goals Patient Stated Goal: get walking distance to increase Progress towards PT goals: Progressing toward goals    Frequency    Min 3X/week      PT Plan Current plan remains appropriate    Co-evaluation              AM-PAC PT "6 Clicks" Mobility   Outcome Measure  Help needed turning from your back to your side while in a flat bed without using bedrails?: A Little Help needed moving from lying on your back to sitting on the side of a flat bed without using bedrails?: A Little Help needed moving to and from a bed to a chair (including a wheelchair)?: A Little Help needed standing up from a chair  using your arms (e.g., wheelchair or bedside chair)?: A Lot Help needed to walk in hospital room?: A Little Help needed climbing 3-5 steps with a railing? : Total 6 Click Score: 15    End of Session Equipment Utilized During Treatment: Gait belt Activity Tolerance: Patient tolerated treatment well Patient left: in chair;with call bell/phone within reach Nurse Communication: Mobility status;Other (comment) PT Visit Diagnosis: Unsteadiness on feet (R26.81);Muscle weakness (generalized) (M62.81);Difficulty in walking, not elsewhere classified (R26.2);Pain Pain - Right/Left:  (both) Pain - part of body: Hip;Knee;Leg;Ankle and joints of foot (generally)     Time: 7408-1448 PT Time Calculation (min) (ACUTE ONLY): 23 min  Charges:  $Gait Training: 8-22 mins $Therapeutic Activity: 8-22 mins                    Ramond Dial 08/17/2020, 2:13 PM  Mee Hives, PT MS Acute Rehab Dept. Number: [place] [facility] [place]

## 2020-08-17 NOTE — Plan of Care (Signed)

## 2020-08-17 NOTE — TOC Progression Note (Signed)
Transition of Care [facility]) - Progression Note    Patient Details  Name: Vanessa [name] MRN: [id number] Date of Birth: 11/29/1955  Transition of Care [facility]) CM/SW [place], [place] Phone Number: 08/17/2020, 8:47 AM  Clinical Narrative:     Patients insurance authorization has been approved. [place] ID# is [id number]. Insurance authorization has been approved from 2/6-2/9. Next review date is 2/9.  Patient has SNF bed at [facility]. Insurance authorization has been approved.  CSW will continue to follow.   Expected Discharge Plan: Skilled Nursing Facility Barriers to Discharge: Continued Medical Work up,SNF Pending bed offer  Expected Discharge Plan and Services Expected Discharge Plan: [place] arrangements for the past 2 months: Mobile Home                                       Social Determinants of Health (SDOH) Interventions    Readmission Risk Interventions Readmission Risk Prevention Plan 11/08/2019 10/21/2019 09/25/2019  Transportation Screening Complete Complete Complete  PCP or Specialist Appt within 3-5 Days - - Not Complete  Not Complete comments - - plan for SNF  [place] or [place] - - Complete  Social Work Consult for [place] Planning/Counseling - - Complete  Palliative Care Screening - - Not Applicable  Medication Review Press photographer) Complete Referral to Pharmacy Referral to Pharmacy  PCP or Specialist appointment within 3-5 days of discharge Complete Complete -  [place] or Home Care Consult Complete (No Data) -  SW Recovery Care/Counseling Consult Complete Complete -  Palliative Care Screening Not Applicable Not Applicable -  Skilled Nursing Facility Complete Complete -  Some recent data might be hidden

## 2020-08-17 NOTE — Progress Notes (Addendum)
Patient ID: Vanessa [name], female   DOB: [birth year], 65 y.o.   MRN: [id number] [facility] Progress Note   Assessment/ Plan:   1. Acute kidney Injury on chronic kidney disease stage IV: Creatinine stable, 3.61 > 3.75>3.71>3.65.  BUN increasing 77> 85>93>100.  Sodium bicarb was discontinued yesterday, bicarb 31 this morning. I's/O's show 800 mL output in last 24 hours, per patient's nurse the purewick has been missing a lot of urine and so urine output is likely significantly greater than documented.  Weight 2/1-102.5kg> improved to 98 kg today, 2/7.  On torsemide 80 mg twice daily and metolazone 5 mg daily. 2.  Hypertension: Blood pressures remain marginally elevated on carvedilol, isosorbide and ongoing diuresis.  Patient started low-dose nifedipine ER for blood pressure.  Most recent pressure 166/118. 3.  Anemia of chronic kidney disease: Iron stores within acceptable range, continue Aranesp. 4.  Hyperphosphatemia: Mild, continue to monitor with labs and begin phosphorus binder if >5.5. 5.  Hypokalemia: Potassium this morning 3.4.  Status post 40 meq K-Dur.  We will continue to monitor  Subjective:    Patient states that she has no complaints this morning.  She has about 450 cc urine in the container and per her nurse a lot of urine has been missed due to the pure wick moving out of position.  The patient states that she has been making a lot of urine over the past 24 hours or so.   Objective:   BP (!) 166/118 (BP Location: Left Arm)   Pulse (!) 111   Temp 97.6 F (36.4 C) (Oral)   Resp 16   Ht 4\' 11"  (1.499 m)   Wt 98 kg   LMP 03/17/2015   SpO2 98%   BMI 43.63 kg/m   Intake/Output Summary (Last 24 hours) at 08/17/2020 0756 Last data filed at 08/16/2020 2055 Gross per 24 hour  Intake 240 ml  Output 800 ml  Net -560 ml   Weight change:   Physical Exam: General: Alert and oriented in no apparent distress Heart: Tachycardic rate, no murmurs, as 1-2+ pitting edema to  the level of mid thigh Lungs: Faint crackles present in the bilateral bases, no respiratory distress on room air Skin: Warm and dry  Imaging: No results found.  Labs: BMET Recent Labs  Lab 08/11/20 0140 08/12/20 0237 08/13/20 0201 08/14/20 0301 08/15/20 0201 08/16/20 0310 08/17/20 0133  NA 143 144 143 144 145 145 145  K 4.6 3.6 3.6 3.2* 3.7 3.6 3.4*  CL 105 103 103 101 101 102 100  CO2 24 29 28 30  32 29 31  GLUCOSE 92 114* 145* 113* 101* 115* 142*  BUN 61* 70* 74* 77* 85* 93* 100*  CREATININE 3.81* 3.78* 3.62* 3.61* 3.75* 3.71* 3.65*  CALCIUM 8.0* 8.1* 8.0* 8.1* 8.2* 8.3* 8.3*  PHOS  --  5.2* 5.2* 5.1* 5.2* 5.4* 5.5*   CBC Recent Labs  Lab 08/12/20 0832 08/13/20 0201 08/14/20 0301  WBC 6.0 4.9 4.9  HGB 9.4* 9.0* 9.0*  HCT 30.9* 29.6* 30.4*  MCV 97.2 97.0 99.3  PLT 238 200 192   Medications:    . aspirin  81 mg Oral Daily  . carvedilol  25 mg Oral BID WC  . ferrous sulfate  325 mg Oral BID WC  . heparin injection (subcutaneous)  5,000 Units Subcutaneous Q8H  . isosorbide mononitrate  30 mg Oral Daily  . metolazone  5 mg Oral Daily  . NIFEdipine  30 mg Oral Daily  .  nutrition supplement (JUVEN)  1 packet Oral BID BM  . polyethylene glycol  17 g Oral Daily  . torsemide  80 mg Oral BID   Lurline Del, DO 08/17/2020, 7:56 AM

## 2020-08-18 DIAGNOSIS — I5031 Acute diastolic (congestive) heart failure: Secondary | ICD-10-CM | POA: Diagnosis not present

## 2020-08-18 DIAGNOSIS — I129 Hypertensive chronic kidney disease with stage 1 through stage 4 chronic kidney disease, or unspecified chronic kidney disease: Secondary | ICD-10-CM | POA: Diagnosis not present

## 2020-08-18 DIAGNOSIS — E1122 Type 2 diabetes mellitus with diabetic chronic kidney disease: Secondary | ICD-10-CM

## 2020-08-18 DIAGNOSIS — N179 Acute kidney failure, unspecified: Secondary | ICD-10-CM | POA: Diagnosis not present

## 2020-08-18 DIAGNOSIS — J9601 Acute respiratory failure with hypoxia: Secondary | ICD-10-CM | POA: Diagnosis not present

## 2020-08-18 LAB — RENAL FUNCTION PANEL
Albumin: 2.5 g/dL — ABNORMAL LOW (ref 3.5–5.0)
Anion gap: 15 (ref 5–15)
BUN: 104 mg/dL — ABNORMAL HIGH (ref 8–23)
CO2: 29 mmol/L (ref 22–32)
Calcium: 8.4 mg/dL — ABNORMAL LOW (ref 8.9–10.3)
Chloride: 101 mmol/L (ref 98–111)
Creatinine, Ser: 3.73 mg/dL — ABNORMAL HIGH (ref 0.44–1.00)
GFR, Estimated: 13 mL/min — ABNORMAL LOW (ref >60)
Glucose, Bld: 156 mg/dL — ABNORMAL HIGH (ref 70–99)
Phosphorus: 5.5 mg/dL — ABNORMAL HIGH (ref 2.5–4.6)
Potassium: 3.1 mmol/L — ABNORMAL LOW (ref 3.5–5.1)
Sodium: 145 mmol/L (ref 135–145)

## 2020-08-18 LAB — MAGNESIUM: Magnesium: 2.3 mg/dL (ref 1.7–2.4)

## 2020-08-18 MED ORDER — CARVEDILOL 25 MG PO TABS: 25.0000 mg | ORAL_TABLET | Freq: Two times a day (BID) | ORAL | Status: AC

## 2020-08-18 MED ORDER — WITCH HAZEL-GLYCERIN EX PADS: MEDICATED_PAD | CUTANEOUS | Status: AC | PRN

## 2020-08-18 MED ORDER — TORSEMIDE 10 MG PO TABS: 50.0000 mg | ORAL_TABLET | Freq: Two times a day (BID) | ORAL | Status: DC

## 2020-08-18 MED ORDER — ISOSORBIDE MONONITRATE ER 30 MG PO TB24: 30.0000 mg | ORAL_TABLET | Freq: Every day | ORAL | Status: AC

## 2020-08-18 MED ORDER — HYDROXYZINE HCL 25 MG PO TABS: 25.0000 mg | ORAL_TABLET | Freq: Three times a day (TID) | ORAL | 0 refills | Status: AC | PRN

## 2020-08-18 MED ORDER — POTASSIUM CHLORIDE ER 20 MEQ PO TBCR: 10.0000 meq | EXTENDED_RELEASE_TABLET | Freq: Two times a day (BID) | ORAL | Status: DC

## 2020-08-18 MED ORDER — HYDRALAZINE HCL 50 MG PO TABS
50.0000 mg | ORAL_TABLET | Freq: Three times a day (TID) | ORAL | Status: AC
Start: 2020-08-18 — End: ?

## 2020-08-18 MED ORDER — POTASSIUM CHLORIDE CRYS ER 20 MEQ PO TBCR
40.0000 meq | EXTENDED_RELEASE_TABLET | Freq: Once | ORAL | Status: AC
  Administered 2020-08-18: 40 meq via ORAL
  Filled 2020-08-18: qty 2

## 2020-08-18 MED ORDER — ASPIRIN 81 MG PO CHEW: 81.0000 mg | CHEWABLE_TABLET | Freq: Every day | ORAL | Status: AC

## 2020-08-18 MED ORDER — HYDROCORTISONE (PERIANAL) 2.5 % EX CREA: 1.0000 "application " | TOPICAL_CREAM | Freq: Two times a day (BID) | CUTANEOUS | 0 refills | Status: AC | PRN

## 2020-08-18 NOTE — Plan of Care (Signed)
  Problem: Health Behavior/Discharge Planning: Goal: Ability to manage health-related needs will improve Outcome: Progressing   Problem: Clinical Measurements: Goal: Ability to maintain clinical measurements within normal limits will improve Outcome: Progressing Goal: Diagnostic test results will improve Outcome: Progressing Goal: Respiratory complications will improve Outcome: Progressing   Problem: Activity: Goal: Risk for activity intolerance will decrease Outcome: Progressing   Problem: Nutrition: Goal: Adequate nutrition will be maintained Outcome: Progressing   Problem: Pain Managment: Goal: General experience of comfort will improve Outcome: Progressing   Problem: Skin Integrity: Goal: Risk for impaired skin integrity will decrease Outcome: Progressing

## 2020-08-18 NOTE — Discharge Instructions (Signed)
Heart Failure, Diagnosis  Heart failure means that your heart is not able to pump blood in the right way. This makes it hard for your body to work well. Heart failure is usually a long-term (chronic) condition. You must take good care of yourself and follow your treatment plan from your doctor. What are the causes?  High blood pressure.  Buildup of cholesterol and fat in the arteries.  Heart attack. This injures the heart muscle.  Heart valves that do not open and close properly.  Damage of the heart muscle. This is also called cardiomyopathy.  Infection of the heart muscle. This is also called myocarditis.  Lung disease. What increases the risk?  Getting older. The risk of heart failure goes up as a person ages.  Being overweight.  Being female.  Use tobacco or nicotine products.  Abusing alcohol or drugs.  Having taken medicines that can damage the heart.  Having any of these conditions: ? Diabetes. ? Abnormal heart rhythms. ? Thyroid problems. ? Low blood counts (anemia).  Having a family history of heart failure. What are the signs or symptoms?  Shortness of breath.  Coughing.  Swelling of the feet, ankles, legs, or belly.  Losing or gaining weight for no reason.  Trouble breathing.  Waking from sleep because of the need to sit up and get more air.  Fast heartbeat.  Being very tired.  Feeling dizzy, or feeling like you may pass out (faint).  Having no desire to eat.  Feeling like you may vomit (nauseous).  Peeing (urinating) more at night.  Feeling confused. How is this treated? This condition may be treated with:  Medicines. These can be given to treat blood pressure and to make the heart muscles stronger.  Changes in your daily life. These may include: ? Eating a healthy diet. ? Staying at a healthy body weight. ? Quitting tobacco, alcohol, and drug use. ? Doing exercises. ? Participating in a cardiac rehabilitation program. This program  helps you improve your health through exercise, education, and counseling.  Surgery. Surgery can be done to open blocked valves, or to put devices in the heart, such as pacemakers.  A donor heart (heart transplant). You will receive a healthy heart from a donor. Follow these instructions at home:  Treat other conditions as told by your doctor. These may include high blood pressure, diabetes, thyroid disease, or abnormal heart rhythms.  Learn as much as you can about heart failure.  Get support as you need it.  Keep all follow-up visits. Summary  Heart failure means that your heart is not able to pump blood in the right way.  This condition is often caused by high blood pressure, heart attack, or damage of the heart muscle.  Symptoms of this condition include shortness of breath and swelling of the feet, ankles, legs, or belly. You may also feel very tired or feel like you may vomit.  You may be treated with medicines, surgery, or changes in your daily life.  Treat other health conditions as told by your doctor. This information is not intended to replace advice given to you by your health care provider. Make sure you discuss any questions you have with your health care provider. Document Revised: 01/18/2020 Document Reviewed: 01/18/2020 Elsevier Patient Education  2021 [employer]  

## 2020-08-18 NOTE — Progress Notes (Signed)
Removed Una boots. Cleaned the legs with soap and water

## 2020-08-18 NOTE — TOC Transition Note (Signed)
Transition of Care [facility]) - CM/SW Discharge Note   Patient Details  Name: Vanessa [name] MRN: [id number] Date of Birth: [birth year]  Transition of Care [facility]) CM/SW Contact:  Vanessa [name], Vanessa [name] Phone Number: 08/18/2020, 11:38 AM   Clinical Narrative:     Patient will DC to: [facility]   Anticipated DC date: 08/18/2020  Family notified: Vanessa [name]   Transport by: Vanessa [name]   ?  Per MD patient ready for DC to [facility] with outpatient palliative services to follow. RN, patient, patient's family, Vanessa [name] with [facility], and facility notified of DC. Discharge Summary sent to facility. RN given number for report tele#[phone] RM#[id number]. DC packet on chart. Ambulance transport requested for patient.  CSW signing off.    Final next level of care: Skilled Nursing Facility Barriers to Discharge: Continued Medical Work up   Patient Goals and CMS Choice Patient states their goals for this hospitalization and ongoing recovery are:: SNF CMS Medicare.gov Compare Post Acute Care list provided to:: Patient Represenative (must comment) (daughter Vanessa [name]) Choice offered to / list presented to : Adult Children (daughter Vanessa [name])  Discharge Placement              Patient chooses bed at: [facility] Patient to be transferred to facility by: [facility] Name of family member notified: Vanessa [name] Patient and family notified of of transfer: 08/18/20  Discharge Plan and Services                                     Social Determinants of Health (SDOH) Interventions     Readmission Risk Interventions Readmission Risk Prevention Plan 11/08/2019 10/21/2019 09/25/2019  Transportation Screening Complete Complete Complete  PCP or Specialist Appt within 3-5 Days - - Not Complete  Not Complete comments - - plan for SNF  [place] or [place] - - Complete  Social Work Consult for [place] Planning/Counseling - - Complete  Palliative Care Screening - - Not Applicable   Medication Review Press photographer) Complete Referral to Pharmacy Referral to Pharmacy  PCP or Specialist appointment within 3-5 days of discharge Complete Complete -  [place] or Home Care Consult Complete (No Data) -  SW Recovery Care/Counseling Consult Complete Complete -  Palliative Care Screening Not Applicable Not Applicable -  Skilled Nursing Facility Complete Complete -  Some recent data might be hidden

## 2020-08-18 NOTE — Care Management Important Message (Signed)
Important Message  Patient Details  Name: Adreena Willits MRN: [id number] Date of Birth: 08-09-1955   Medicare Important Message Given:  Yes     Shelda Altes 08/18/2020, 11:18 AM

## 2020-08-18 NOTE — Progress Notes (Signed)
Physical Therapy Treatment Patient Details Name: Vanessa [name] MRN: [id number] DOB: 07-14-1955 Today's Date: 08/18/2020    History of Present Illness 65 y.o. female past medical history significant for endometrial carcinoma diagnosed in 2021, COVID-19 viral pneumonia 2021, MSSA bacteremia, essential hypertension, diabetes mellitus type 2, chronic kidney disease stage III, chronic diastolic heart failure, anemia of chronic disease. Admitted with a. fib with RVR, acute diastolic heart failure, and hypokalemia.    PT Comments    Pt received up in chair, agreeable to therapy session with good participation. Primary session focus on building standing tolerance and seated/standing exercises and short gait task in room for BLE strengthening. Pt instructed on importance of keeping legs elevated while in chair and ankle pumps for edema reduction but will need reinforcement for compliance. Pt continues to benefit from PT services to progress toward functional mobility goals. Continue to recommend SNF.  Follow Up Recommendations  SNF     Equipment Recommendations  None recommended by PT    Recommendations for Other Services       Precautions / Restrictions Precautions Precautions: Fall Precaution Comments: impulsive to sit Restrictions Weight Bearing Restrictions: No    Mobility  Bed Mobility               General bed mobility comments: up in chair when PT arrived  Transfers Overall transfer level: Needs assistance Equipment used: Rolling walker (2 wheeled) Transfers: Sit to/from Stand Sit to Stand: Min assist         General transfer comment: from chair x3 reps to RW; increased time for trunk upright; cues for hands  Ambulation/Gait Ambulation/Gait assistance: Min assist Gait Distance (Feet): 15 Feet (and pre-gait hip flexion x10 reps B) Assistive device: Rolling walker (2 wheeled) Gait Pattern/deviations: Step-through pattern;Decreased stride length;Shuffle;Wide base of  support;Trunk flexed Gait velocity: decreased   General Gait Details: cues for upright posture and proximity to RW as tends to hold it outside BOS; needs manual assist sometimes to manage RW   Stairs             Wheelchair Mobility    Modified Rankin (Stroke Patients Only)       Balance Overall balance assessment: Needs assistance Sitting-balance support: Feet supported Sitting balance-Leahy Scale: Fair     Standing balance support: Bilateral upper extremity supported;During functional activity Standing balance-Leahy Scale: Poor Standing balance comment: flexed postural support of UE's reliant on RW for gait                            Cognition Arousal/Alertness: Awake/alert Behavior During Therapy: WFL for tasks assessed/performed Overall Cognitive Status: Within Functional Limits for tasks assessed                                 General Comments: decreased safety awareness and impulsive at times, chair alarm activated for safety      Exercises General Exercises - Lower Extremity Ankle Circles/Pumps: AROM;Both;Seated;20 reps Long Arc Quad: AROM;Both;10 reps;Seated Hip Flexion/Marching: AROM;Both;10 reps;Standing STS x 3 for strengthening   General Comments General comments (skin integrity, edema, etc.): gait distance limited due to urinary urgency; pt internally distracted but participatory as able; HR to 116 bpm with activity; BP 116/86 (97) seated in chair      Pertinent Vitals/Pain Pain Assessment: Faces Faces Pain Scale: Hurts a little bit Pain Location: generalized discomfort, esp in groin/perineal area and feeling  itchy Pain Descriptors / Indicators: Discomfort ("feeling raw") Pain Intervention(s): Monitored during session;Repositioned Scientist, forensic notified pt may need barrier cream for discomfort)    Home Living                      Prior Function            PT Goals (current goals can now be found in the care  plan section) Acute Rehab PT Goals Patient Stated Goal: get walking distance to increase PT Goal Formulation: With patient Time For Goal Achievement: 08/22/20 Potential to Achieve Goals: Good Progress towards PT goals: Progressing toward goals    Frequency    Min 3X/week      PT Plan Current plan remains appropriate    Co-evaluation              AM-PAC PT "6 Clicks" Mobility   Outcome Measure  Help needed turning from your back to your side while in a flat bed without using bedrails?: A Little Help needed moving from lying on your back to sitting on the side of a flat bed without using bedrails?: A Little Help needed moving to and from a bed to a chair (including a wheelchair)?: A Little Help needed standing up from a chair using your arms (e.g., wheelchair or bedside chair)?: A Little Help needed to walk in hospital room?: A Little Help needed climbing 3-5 steps with a railing? : Total 6 Click Score: 16    End of Session Equipment Utilized During Treatment: Gait belt Activity Tolerance: Patient tolerated treatment well Patient left: in chair;with call bell/phone within reach Nurse Communication: Mobility status;Other (comment) PT Visit Diagnosis: Unsteadiness on feet (R26.81);Muscle weakness (generalized) (M62.81);Difficulty in walking, not elsewhere classified (R26.2);Pain Pain - Right/Left:  (both) Pain - part of body:  (pt unable to localize; generalized discomfort)     Time: 6606-0045 PT Time Calculation (min) (ACUTE ONLY): 18 min  Charges:  $Therapeutic Exercise: 8-22 mins                     Emelyn Roen P., PTA Acute Rehabilitation Services Pager: [phone] Office: [place] 08/18/2020, 12:14 PM

## 2020-08-18 NOTE — Discharge Summary (Signed)
Physician Discharge Summary  Vanessa [name] LGX:[id number] DOB: 1956-04-18 DOA: 08/07/2020  PCP: Patient, No Pcp Per  Admit date: 08/07/2020 Discharge date: 08/18/2020  Time spent: 45 minutes  Recommendations for Outpatient Follow-up:  Patient will be discharged to skilled nursing facility, continue physical and occupational therapy, and palliative care to follow, repeat BMP.  Patient will need to follow up with primary care provider within one week of discharge.  Follow-up with nephrology.  Follow-up with cardiology in 3 weeks.  Patient should continue medications as prescribed.  Patient should follow a heart healthy diet with 159ml fluid restriction per day.    Discharge Diagnoses:  Acute combined systolic and diastolic heart failure New onset atrial fibrillation with RVR Elevated troponin  Acute kidney injury on chronic kidney disease, stage IV Refractory hypokalemia Bilateral lower extremity edema Anemia of chronic disease Essential hypertension Endometrial cancer Severe morbid obesity Deconditioning Goals of care Stage II sacral decubitus ulcer Pruritus Morbid obesity  Discharge Condition: Stable  Diet recommendation: Heart healthy  Filed Weights   08/13/20 0920 08/14/20 0939 08/17/20 0426  Weight: 100.2 kg 99.9 kg 98 kg    History of present illness:  on 08/07/2020 by Dr. Oletta Cohn [name] a 65 y.o.femalewith medical history significant forendometrial cancer diagnosed in 2021, previously treated COVID-19 viral pneumonia in 2021, MSSA bacteremia, essential hypertension, hyperlipidemia, esophageal dysmotility, type 2 diabetes, CKD 3, asthma, anemia of chronic disease who presented to [facility] ED due to worsening edema involving her lower extremities up to her abdomen. States she ran out of her medications 2 weeks ago. Associated with generalized weakness andshortness of breath. Denies any chest pain. Endorses that she has had a poor appetite. Work-up in the  ED revealed volume overload for which she was started on IV diuretics by EDPand Afib with RvR. TRH was asked to admit.  Hospital Course:  Acute combined systolic and diastolic heart failure -Presented with weakness and worsening swelling in her extremities up to her abdomen -BNP greater than 2200 -Echocardiogram shows an EF of 35 to 40%, left ventricle demonstrates global hypokinesis.  Indeterminate diastolic function.  RV systolic function mildly reduced, moderately elevated pulmonary artery systolic pressure, estimated right ventricular systolic pressure 81.4 mmHg. -Continue to monitor intake and output, daily weight -Cardiology and nephrology consulted and appreciated -Lasix regimen was switched to 160 mg twice daily then to 80 mg 3 times daily along with albumin. -Nephrology transitioned to torsemide 80 mg twice daily today -Discussed with nephrology, Dr. Carolin Sicks, and and holding metolazone.  Discharging with torsemide 50 mg twice daily, with close follow-up with nephrology as an outpatient.    New onset atrial fibrillation with RVR -Continue Coreg -Patient is not a candidate for anticoagulation due to history of endometrial cancer and high risk of bleeding -Cardiology recommended outpatient follow-up in 3 weeks.  Patient remains in atrial flutter with improved ventricular rates of 100 to 110 bpm, continue Coreg 25 mg twice daily.  Elevated troponin  -Suspect secondary to demand ischemia in the setting of atrial fibrillation, chronic kidney disease, heart failure exacerbation -Echocardiogram showed no wall motion abnormalities  Acute kidney injury on chronic kidney disease, stage IV -On admission creatinine was 3.1 (baseline creatinine approximately 2.4) -Renal ultrasound showed no hydronephrosis, with an atrophic left kidney and her right kidney appears unremarkable -Nephrology consulted and appreciated -Treatment plan as noted above -Question if patient is now having  contraction alkalosis -Creatinine currently 3.73  Refractory hypokalemia -Likely secondary to diuretics -Potassium 3.1 -Continue potassium supplementation  Bilateral lower extremity edema -Secondary to volume overload -Continue treatment and plan as above  Anemia of chronic disease -Hemoglobin appears to be stable, no overt signs of bleeding  Essential hypertension -Will resume hydralazine today -Continue Coreg  Endometrial cancer -Patient was not a candidate for hysterectomy -Per chart review, goal therapy was palliative in order to control her bleeding -Palliative care consulted and appreciated  Severe morbid obesity -BMI 45.64  Deconditioning -PT recommending SNF  Goals of care -Palliative care consulted and appreciated, patient wishes to remain full code although she is considering changing her status depending on deterioration. -Will discharge to SNF with palliative care to follow -Patient does not want her spouse to be involved in her care however wants daughter to be her medical decision maker  Stage II sacral decubitus ulcer -Present on admission  Pruritus -Improved with hydroxyzine  Morbid obesity -BMI 44.48- down to 43.63 -Patient will need to follow-up with her PCP for lifestyle modifications  Consultants Cardiology Nephrology  Procedures  Echocardiogram  Discharge Exam: Vitals:   08/18/20 1000 08/18/20 1029  BP: 130/88 (!) 129/97  Pulse:    Resp:    Temp:    SpO2:      Exam  General: Well developed, chronically ill-appearing, NAD  HEENT: NCAT,  mucous membranes moist.  Poor dentition   Cardiovascular: S1 S2 auscultated, tachycardic  Respiratory: Diminished breath sounds however clear  Abdomen: Soft, obese, nontender, nondistended, + bowel sounds  Extremities: warm dry without cyanosis clubbing.  LE with Unna boots in place B/L,  also with edema in LE and UE, improving   Neuro: AAOx3, nonfocal  Psych: appropriate  mood and affect pleasant    Discharge Instructions Discharge Instructions    Discharge instructions   Complete by: As directed    Patient will be discharged to skilled nursing facility, continue physical and occupational therapy, and palliative care to follow, repeat BMP.  Patient will need to follow up with primary care provider within one week of discharge.  Follow-up with nephrology.  Follow-up with cardiology in 3 weeks.  Patient should continue medications as prescribed.  Patient should follow a heart healthy diet with 1541ml fluid restriction per day.     Allergies as of 08/18/2020      Reactions   Ace Inhibitors Other (See Comments)   Never prescribed but she has had angioedema   Angiotensin Receptor Blockers Other (See Comments)   ARB never Rxed but PMH  of angioedema in March 2021      Medication List    STOP taking these medications   amLODipine 10 MG tablet Commonly known as: NORVASC   cloNIDine 0.2 MG tablet Commonly known as: CATAPRES   feeding supplement (PRO-STAT SUGAR FREE 64) Liqd   metolazone 5 MG tablet Commonly known as: ZAROXOLYN   sodium bicarbonate 650 MG tablet     TAKE these medications   acetaminophen 500 MG tablet Commonly known as: TYLENOL Take 500 mg by mouth every 6 (six) hours as needed (pain).   aspirin 81 MG chewable tablet Chew 1 tablet (81 mg total) by mouth daily.   carvedilol 25 MG tablet Commonly known as: COREG Take 1 tablet (25 mg total) by mouth 2 (two) times daily with a meal. What changed:   medication strength  how much to take   diphenhydrAMINE 25 MG tablet Commonly known as: BENADRYL Take 25 mg by mouth daily as needed for itching or allergies.   ferrous sulfate 325 (65 FE) MG tablet Take 1 tablet (  325 mg total) by mouth 2 (two) times daily with a meal.   hydrALAZINE 50 MG tablet Commonly known as: APRESOLINE Take 1 tablet (50 mg total) by mouth every 8 (eight) hours. What changed:   medication  strength  how much to take   hydrocerin Crea Apply 1 application topically daily.   hydrocortisone 2.5 % rectal cream Commonly known as: ANUSOL-HC Place 1 application rectally every 12 (twelve) hours as needed for hemorrhoids.   hydrOXYzine 25 MG tablet Commonly known as: ATARAX/VISTARIL Take 1 tablet (25 mg total) by mouth 3 (three) times daily as needed for itching.   isosorbide mononitrate 30 MG 24 hr tablet Commonly known as: IMDUR Take 1 tablet (30 mg total) by mouth daily. Start taking on: August 19, 2020   loratadine 10 MG tablet Commonly known as: CLARITIN Take 1 tablet (10 mg total) by mouth daily as needed for allergies.   nutrition supplement (JUVEN) Pack Take 1 packet by mouth 2 (two) times daily between meals.   polyethylene glycol 17 g packet Commonly known as: MIRALAX / GLYCOLAX Take 17 g by mouth daily. What changed:   when to take this  reasons to take this   Potassium Chloride ER 20 MEQ Tbcr Take 10 mEq by mouth 2 (two) times daily.   torsemide 10 MG tablet Commonly known as: DEMADEX Take 5 tablets (50 mg total) by mouth 2 (two) times daily.   witch hazel-glycerin pad Commonly known as: TUCKS Apply topically as needed for itching, hemorrhoids or irritation.      Allergies  Allergen Reactions  . Ace Inhibitors Other (See Comments)    Never prescribed but she has had angioedema  . Angiotensin Receptor Blockers Other (See Comments)    ARB never Rxed but PMH  of angioedema in March 2021    Contact information for follow-up providers    Burtis Junes, NP Follow up.   Specialties: Nurse Practitioner, Interventional Cardiology, Cardiology, Radiology Why: Hospital follow-up with Cardiology scheduled for 09/02/2020 at 11:15am with Truitt Merle, one of our office's NPs. Please arrive 15 minutes early for check-in. If this date/time does not work, please call our office to reschedule. Contact information: [place].  [address] [place] [address] [phone]        Rosita Fire, MD Follow up.   Specialties: Nephrology, Internal Medicine Why: Hospital follow up Contact information: [place] [address] [phone]            Contact information for after-discharge care    Destination    [facility] .   Service: Skilled Nursing Contact information: [place] [phone]                   The results of significant diagnostics from this hospitalization (including imaging, microbiology, ancillary and laboratory) are listed below for reference.    Significant Diagnostic Studies: US RENAL  Result Date: 08/09/2020 CLINICAL DATA:  Acute kidney injury EXAM: RENAL / URINARY TRACT ULTRASOUND COMPLETE COMPARISON:  None. FINDINGS: Right Kidney: Renal measurements: 10.2 x 5.4 x 6.8 cm = volume: 186 mL. Echogenicity within normal limits. No suspicious mass or hydronephrosis visualized. Left Kidney: Renal measurements: 6.4 x 2.7 x 4.2 cm = volume: 38 mL. Atrophic. No mass or hydronephrosis visualized. Bladder: Questionable bladder wall thickening. This may be reactive to the adjacent ascites. Other: Large amount of site ease throughout the abdomen. IMPRESSION: 1. RIGHT kidney is unremarkable.  No hydronephrosis. 2. LEFT kidney is atrophic, corresponding to appearance on CT abdomen of 11/07/2019. No hydronephrosis. 3. Questionable bladder wall thickening. This may be reactive to adjacent ascites. Recommend correlation with urinalysis. 4. Large amount of ascites is seen throughout the abdomen and pelvis. Electronically Signed   By: Franki Cabot M.D.   On: 08/09/2020 14:05   DG Chest Portable 1 View  Result Date: 08/07/2020 CLINICAL DATA:  Shortness of breath.  Cough. EXAM: PORTABLE CHEST 1 VIEW COMPARISON:  Most recent radiograph 04/22/2020 FINDINGS: Right pleural effusion is increased from prior exam, now  with fluid in the right minor fissure. Associated right basilar opacity favors atelectasis. Cardiomegaly is unchanged. No convincing left pleural effusion. Vascular congestion without frank edema. No pneumothorax. Bones are diffusely under mineralized. Healing or remote proximal left humerus fracture, partially included. IMPRESSION: 1. Increasing right pleural effusion, now with fluid in the right minor fissure. Associated right basilar opacity favors atelectasis. 2. Cardiomegaly with vascular congestion. Electronically Signed   By: Keith Rake M.D.   On: 08/07/2020 18:00   ECHOCARDIOGRAM LIMITED  Result Date: 08/08/2020    ECHOCARDIOGRAM LIMITED REPORT   Patient Name:   Vanessa [name] Date of Exam: 08/08/2020 Medical Rec #:  [id number]       Height:       59.0 in Accession #:    [id number]      Weight:       253.1 lb Date of Birth:  [birth year]       BSA:          2.038 m Patient Age:    65 years        BP:           137/104 mmHg Patient Gender: F               HR:           117 bpm. Exam Location:  Inpatient Procedure: Limited Echo, Limited Color Doppler and Cardiac Doppler Indications:    congestive heart failure  History:        Patient has prior history of Echocardiogram examinations, most                 recent 09/18/2019. Chronic kidney disease,                 Signs/Symptoms:elevated troponin; Risk Factors:Diabetes and                 Hypertension.  Sonographer:    Johny Chess Referring Phys: [id number] [place]  1. Left ventricular ejection fraction, by estimation, is 35 to 40%. The left ventricle has moderately decreased function. The left ventricle demonstrates global hypokinesis. There is mild concentric left ventricular hypertrophy. Indeterminate diastolic filling due to E-A fusion. Elevated left atrial pressure.  2. Right ventricular systolic function is moderately reduced. Mildly increased right ventricular wall thickness. There is moderately elevated pulmonary artery  systolic pressure. The estimated right ventricular systolic pressure is 73.4 mmHg.  3. Left atrial size was mildly dilated.  4. The mitral valve is normal in structure. Trivial mitral valve regurgitation.  5. Tricuspid valve regurgitation is moderate.  6. The aortic valve is normal in structure. Aortic valve regurgitation is mild. No aortic stenosis is present.  7. The inferior vena cava is dilated in size with <50% respiratory variability, suggesting right atrial pressure of 15 mmHg. Comparison(s): Prior images reviewed side by side. The left ventricular function is significantly worse. FINDINGS  Left Ventricle:  Left ventricular ejection fraction, by estimation, is 35 to 40%. The left ventricle has moderately decreased function. The left ventricle demonstrates global hypokinesis. There is mild concentric left ventricular hypertrophy. Indeterminate diastolic filling due to E-A fusion. Elevated left atrial pressure. Right Ventricle: Mildly increased right ventricular wall thickness. Right ventricular systolic function is moderately reduced. There is moderately elevated pulmonary artery systolic pressure. The tricuspid regurgitant velocity is 3.26 m/s, and with an assumed right atrial pressure of 15 mmHg, the estimated right ventricular systolic pressure is 72.5 mmHg. Left Atrium: Left atrial size was mildly dilated. Right Atrium: Right atrial size was normal in size. Pericardium: Trivial pericardial effusion is present. Mitral Valve: The mitral valve is normal in structure. Trivial mitral valve regurgitation. Tricuspid Valve: The tricuspid valve is normal in structure. Tricuspid valve regurgitation is moderate. Aortic Valve: The aortic valve is normal in structure. Aortic valve regurgitation is mild. No aortic stenosis is present. Pulmonic Valve: The pulmonic valve was normal in structure. Pulmonic valve regurgitation is mild. Aorta: The aortic root is normal in size and structure. Venous: The inferior vena cava is  dilated in size with less than 50% respiratory variability, suggesting right atrial pressure of 15 mmHg. IAS/Shunts: No atrial level shunt detected by color flow Doppler. Additional Comments: There is pleural effusion in the left lateral region. LEFT VENTRICLE PLAX 2D LVIDd:         3.30 cm LVIDs:         2.60 cm LV PW:         1.20 cm LV IVS:        1.20 cm LVOT diam:     1.90 cm LVOT Area:     2.84 cm  LV Volumes (MOD) LV vol d, MOD A4C: 63.1 ml LV vol s, MOD A4C: 40.3 ml LV SV MOD A4C:     63.1 ml IVC IVC diam: 2.40 cm LEFT ATRIUM         Index LA diam:    3.60 cm 1.77 cm/m   AORTA Ao Root diam: 2.70 cm TRICUSPID VALVE TR Peak grad:   42.5 mmHg TR Vmax:        326.00 cm/s  SHUNTS Systemic Diam: 1.90 cm Dani Gobble Croitoru MD Electronically signed by Sanda Klein MD Signature Date/Time: 08/08/2020/12:55:19 PM    Final     Microbiology: Recent Results (from the past 240 hour(s))  SARS CORONAVIRUS 2 (TAT 6-24 HRS)     Status: None   Collection Time: 08/12/20 11:45 AM  Result Value Ref Range Status   SARS Coronavirus 2 NEGATIVE NEGATIVE Final    Comment: (NOTE) SARS-CoV-2 target nucleic acids are NOT DETECTED.  The SARS-CoV-2 RNA is generally detectable in upper and lower respiratory specimens during the acute phase of infection. Negative results do not preclude SARS-CoV-2 infection, do not rule out co-infections with other pathogens, and should not be used as the sole basis for treatment or other patient management decisions. Negative results must be combined with clinical observations, patient history, and epidemiological information. The expected result is Negative.  Fact Sheet for Patients: SugarRoll.be  Fact Sheet for Healthcare Providers: https://www.woods-mathews.com/  This test is not yet approved or cleared by the [place] FDA and  has been authorized for detection and/or diagnosis of SARS-CoV-2 by FDA under an Emergency Use  Authorization (EUA). This EUA will remain  in effect (meaning this test can be used) for the duration of the COVID-19 declaration under Se ction 564(b)(1) of the Act, 21 U.S.C. section 360bbb-3(b)(1),  unless the authorization is terminated or revoked sooner.  Performed at [facility], [place] [address], [place], [place] [address]   SARS CORONAVIRUS 2 (TAT 6-24 HRS) Nasopharyngeal Nasopharyngeal Swab     Status: None   Collection Time: 08/16/20 12:21 PM   Specimen: Nasopharyngeal Swab  Result Value Ref Range Status   SARS Coronavirus 2 NEGATIVE NEGATIVE Final    Comment: (NOTE) SARS-CoV-2 target nucleic acids are NOT DETECTED.  The SARS-CoV-2 RNA is generally detectable in upper and lower respiratory specimens during the acute phase of infection. Negative results do not preclude SARS-CoV-2 infection, do not rule out co-infections with other pathogens, and should not be used as the sole basis for treatment or other patient management decisions. Negative results must be combined with clinical observations, patient history, and epidemiological information. The expected result is Negative.  Fact Sheet for Patients: SugarRoll.be  Fact Sheet for Healthcare Providers: https://www.woods-mathews.com/  This test is not yet approved or cleared by the [place] FDA and  has been authorized for detection and/or diagnosis of SARS-CoV-2 by FDA under an Emergency Use Authorization (EUA). This EUA will remain  in effect (meaning this test can be used) for the duration of the COVID-19 declaration under Se ction 564(b)(1) of the Act, 21 U.S.C. section 360bbb-3(b)(1), unless the authorization is terminated or revoked sooner.  Performed at [facility], [place] [address], [place], [place] [address]      Labs: Basic Metabolic Panel: Recent Labs  Lab 08/14/20 0301 08/15/20 0201 08/16/20 0310 08/17/20 0133 08/18/20 0435  NA 144 145 145  145 145  K 3.2* 3.7 3.6 3.4* 3.1*  CL 101 101 102 100 101  CO2 30 32 29 31 29   GLUCOSE 113* 101* 115* 142* 156*  BUN 77* 85* 93* 100* 104*  CREATININE 3.61* 3.75* 3.71* 3.65* 3.73*  CALCIUM 8.1* 8.2* 8.3* 8.3* 8.4*  MG 2.3  --   --   --  2.3  PHOS 5.1* 5.2* 5.4* 5.5* 5.5*   Liver Function Tests: Recent Labs  Lab 08/14/20 0301 08/15/20 0201 08/16/20 0310 08/17/20 0133 08/18/20 0435  ALBUMIN 2.3* 2.3* 2.7* 2.7* 2.5*   No results for input(s): LIPASE, AMYLASE in the last 168 hours. No results for input(s): AMMONIA in the last 168 hours. CBC: Recent Labs  Lab 08/12/20 0832 08/13/20 0201 08/14/20 0301  WBC 6.0 4.9 4.9  HGB 9.4* 9.0* 9.0*  HCT 30.9* 29.6* 30.4*  MCV 97.2 97.0 99.3  PLT 238 200 192   Cardiac Enzymes: No results for input(s): CKTOTAL, CKMB, CKMBINDEX, TROPONINI in the last 168 hours. BNP: BNP (last 3 results) Recent Labs    11/09/19 0430 02/14/20 2012 08/07/20 1736  BNP 1,043.9* 1,926.7* 2,227.0*    ProBNP (last 3 results) No results for input(s): PROBNP in the last 8760 hours.  CBG: No results for input(s): GLUCAP in the last 168 hours.     Signed:  Cristal Ford  [facility] 08/18/2020, 10:40 AM

## 2020-08-18 NOTE — Progress Notes (Addendum)
Patient ID: Vanessa [name], female   DOB: 12/02/1955, 65 y.o.   MRN: [id number] [facility] Progress Note   Assessment/ Plan:   1. Acute kidney Injury on chronic kidney disease stage IV: Creatinine stable, 3.61 > 3.75>3.71>3.65> 3.73.  BUN increasing 77> 85>93>100>104. Bicarb 29 this morning. I's/O's show 549mL output plus one unmeasured void in last 24 hours, per patient's nurse the purewick has been missing a lot of urine and so urine output is likely significantly greater than documented.  Weight 2/1-102.5kg> improved to 98 kg on 2/7.  Continues on torsemide 80 mg twice daily. 2.  Hypertension: Blood pressures remain marginally elevated on carvedilol, isosorbide and ongoing diuresis.  Discontinued nifedapine and restarted hydralazine yesterday.  Most recent pressure 137/94 3.  Anemia of chronic kidney disease: Iron stores within acceptable range, continue Aranesp. 4.  Hyperphosphatemia: Mild, continue to monitor with labs and begin phosphorus binder if >5.5.  Current Phos of 5.5. 5.  Hypokalemia: Potassium this morning 3.1.  Status post 33meq K-Dur.  We will give an additional 32meq Kdur.  We will continue to monitor  Subjective:    Patient states she has been putting out a large amount of urine overnight.  She states that she does feel better in general.  Denies nausea.  She states that her leg swelling is not quite back to his baseline but it is significantly improved.  She denies other complaints at this time.   Objective:   BP (!) 137/94 (BP Location: Left Arm)   Pulse (!) 108   Temp 97.6 F (36.4 C) (Oral)   Resp 18   Ht 4\' 11"  (1.499 m)   Wt 98 kg   LMP 03/17/2015   SpO2 99%   BMI 43.63 kg/m   Intake/Output Summary (Last 24 hours) at 08/18/2020 9983 Last data filed at 08/17/2020 2116 Gross per 24 hour  Intake 360 ml  Output 502 ml  Net -142 ml   Weight change:   Physical Exam: General: Alert and oriented in no apparent distress Heart: Tachycardic rate, no  murmurs Lungs: Faint crackles present in the bilateral bases but seem improved from yesterday, no respiratory distress on room air Skin: Warm and dry Extremities: 1+ pitting edema to the level of the mid thigh, Unna boots in place  Imaging: No results found.  Labs: BMET Recent Labs  Lab 08/12/20 0237 08/13/20 0201 08/14/20 0301 08/15/20 0201 08/16/20 0310 08/17/20 0133 08/18/20 0435  NA 144 143 144 145 145 145 145  K 3.6 3.6 3.2* 3.7 3.6 3.4* 3.1*  CL 103 103 101 101 102 100 101  CO2 29 28 30  32 29 31 29   GLUCOSE 114* 145* 113* 101* 115* 142* 156*  BUN 70* 74* 77* 85* 93* 100* 104*  CREATININE 3.78* 3.62* 3.61* 3.75* 3.71* 3.65* 3.73*  CALCIUM 8.1* 8.0* 8.1* 8.2* 8.3* 8.3* 8.4*  PHOS 5.2* 5.2* 5.1* 5.2* 5.4* 5.5* 5.5*   CBC Recent Labs  Lab 08/12/20 0832 08/13/20 0201 08/14/20 0301  WBC 6.0 4.9 4.9  HGB 9.4* 9.0* 9.0*  HCT 30.9* 29.6* 30.4*  MCV 97.2 97.0 99.3  PLT 238 200 192   Medications:    . aspirin  81 mg Oral Daily  . carvedilol  25 mg Oral BID WC  . ferrous sulfate  325 mg Oral BID WC  . heparin injection (subcutaneous)  5,000 Units Subcutaneous Q8H  . hydrALAZINE  25 mg Oral Q8H  . isosorbide mononitrate  30 mg Oral Daily  . nutrition  supplement (JUVEN)  1 packet Oral BID BM  . polyethylene glycol  17 g Oral Daily  . torsemide  80 mg Oral BID   Lurline Del, DO 08/18/2020, 6:33 AM

## 2020-08-18 NOTE — Progress Notes (Signed)
Orthopedic Tech Progress Note Patient Details:  Vanessa [name] [birth year] [id number] RN called requesting to change patient UNNA BOOTS. I asked her to remove them, clean legs and I could come an apply a new pair.  Patient ID: Vanessa [name], female   DOB: 1956/05/06, 65 y.o.   MRN: [id number]   Vanessa [name] 08/18/2020, 11:53 AM

## 2020-08-18 NOTE — Progress Notes (Signed)
[facility] ([facility]) Community Based Palliative Care       This patient has been referred to our palliative care services in the community.  [facility] will continue to follow for any discharge planning needs and to coordinate admission onto palliative care.    Thank you for the opportunity to participate in this patient's care.     Chrislyn King, BSN, RN [facility] Hospital Liaison   [phone] (24h on call) 

## 2020-08-19 NOTE — Progress Notes (Deleted)
CARDIOLOGY OFFICE NOTE  Date:  08/19/2020    Vanessa [name] Date of Birth: Sep 02, 1955 Medical Record #[id number]  PCP:  Patient, No Pcp Per  Cardiologist:  Meda Coffee  No chief complaint on file.   History of Present Illness: Vanessa [name] is a 65 y.o. female who presents today for a post hospital visit. Seen for Dr. Meda Coffee (NEW).   She has a history of endometrial cancer diagnosed in 2021, previously treated COVID-19 viral pneumonia in 2021, MSSA bacteremia, HTN, HLD, esophageal dysmotility, type 2 diabetes, CKD 3, asthma, and anemia of chronic disease. No known CAD.   Presented recently to [facility] ED due to worsening edema involving her lower extremities up to her abdomen. Had ran out of her medications 2 weeks prior. Noted AF with RVR as well.   Comes in today. Here with   Past Medical History:  Diagnosis Date  . Anemia 10/09/2019  . Asthma   . Cellulitis and abscess of lower extremity 04/22/2020  . CKD (chronic kidney disease) stage 3, GFR 30-59 ml/min (HCC)   . Dental disease   . Diabetes mellitus without complication ([place])   . Endometrial cancer ([place])   . Esophageal dysmotility   . Heart murmur   . HLD (hyperlipidemia)   . Hypertension   . MSSA bacteremia 09/2019  . Obesity, Class III, BMI 40-49.9 (morbid obesity) ([place])   . Pressure ulcer   . Vaginal bleeding 05/2020    Past Surgical History:  Procedure Laterality Date  . BIOPSY  10/08/2019   Procedure: BIOPSY;  Surgeon: Clarene Essex, MD;  Location: [facility] ENDOSCOPY;  Service: Endoscopy;;  . ESOPHAGOGASTRODUODENOSCOPY N/A 10/08/2019   Procedure: ESOPHAGOGASTRODUODENOSCOPY (EGD);  Surgeon: Clarene Essex, MD;  Location: [place];  Service: Endoscopy;  Laterality: N/A;  . TYMPANOSTOMY TUBE PLACEMENT Bilateral      Medications: No outpatient medications have been marked as taking for the 09/02/20 encounter (Appointment) with Burtis Junes, NP.     Allergies: Allergies  Allergen Reactions  . Ace Inhibitors  Other (See Comments)    Never prescribed but she has had angioedema  . Angiotensin Receptor Blockers Other (See Comments)    ARB never Rxed but PMH  of angioedema in March 2021    Social History: The patient  reports that she has quit smoking. She has never used smokeless tobacco. She reports current alcohol use. She reports that she does not use drugs.   Family History: The patient's ***family history includes Cancer in her maternal grandmother and mother; Diabetes in her brother and mother; Hypertension in her brother and mother; Stroke in her mother.   Review of Systems: Please see the history of present illness.   All other systems are reviewed and negative.   Physical Exam: VS:  LMP 03/17/2015  .  BMI There is no height or weight on file to calculate BMI.  Wt Readings from Last 3 Encounters:  08/17/20 216 lb (98 kg)  06/10/20 260 lb (117.9 kg)  05/24/20 260 lb (117.9 kg)    General: Pleasant. Well developed, well nourished and in no acute distress.   HEENT: Normal.  Neck: Supple, no JVD, carotid bruits, or masses noted.  Cardiac: ***Regular rate and rhythm. No murmurs, rubs, or gallops. No edema.  Respiratory:  Lungs are clear to auscultation bilaterally with normal work of breathing.  GI: Soft and nontender.  MS: No deformity or atrophy. Gait and ROM intact.  Skin: Warm and dry. Color is normal.  Neuro:  Strength  and sensation are intact and no gross focal deficits noted.  Psych: Alert, appropriate and with normal affect.   LABORATORY DATA:  EKG:  EKG {ACTION; IS/IS HWE:[id number]} ordered today.  Personally reviewed by me. This demonstrates ***.  Lab Results  Component Value Date   WBC 4.9 08/14/2020   HGB 9.0 (L) 08/14/2020   HCT 30.4 (L) 08/14/2020   PLT 192 08/14/2020   GLUCOSE 156 (H) 08/18/2020   CHOL 153 08/10/2015   TRIG 82 08/10/2015   HDL 61 08/10/2015   LDLCALC 76 08/10/2015   ALT 60 (H) 08/07/2020   AST 48 (H) 08/07/2020   NA 145 08/18/2020   K  3.1 (L) 08/18/2020   CL 101 08/18/2020   CREATININE 3.73 (H) 08/18/2020   BUN 104 (H) 08/18/2020   CO2 29 08/18/2020   TSH 4.135 11/13/2019   INR 1.3 (H) 08/07/2020   HGBA1C 5.9 (H) 04/22/2020   MICROALBUR 12.5 03/24/2016     BNP (last 3 results) Recent Labs    11/09/19 0430 02/14/20 2012 08/07/20 1736  BNP 1,043.9* 1,926.7* 2,227.0*    ProBNP (last 3 results) No results for input(s): PROBNP in the last 8760 hours.   Other Studies Reviewed Today:  Echocardiogram 08/08/2020: Impressions: 1. Left ventricular ejection fraction, by estimation, is 35 to 40%. The  left ventricle has moderately decreased function. The left ventricle  demonstrates global hypokinesis. There is mild concentric left ventricular  hypertrophy. Indeterminate diastolic  filling due to E-A fusion. Elevated left atrial pressure.  2. Right ventricular systolic function is moderately reduced. Mildly  increased right ventricular wall thickness. There is moderately elevated  pulmonary artery systolic pressure. The estimated right ventricular  systolic pressure is 78.9 mmHg.  3. Left atrial size was mildly dilated.  4. The mitral valve is normal in structure. Trivial mitral valve  regurgitation.  5. Tricuspid valve regurgitation is moderate.  6. The aortic valve is normal in structure. Aortic valve regurgitation is  mild. No aortic stenosis is present.  7. The inferior vena cava is dilated in size with <50% respiratory  variability, suggesting right atrial pressure of 15 mmHg.   Comparison(s): Prior images reviewed side by side. The left ventricular  function is significantly worse.    Assessment & Plan    Acute on Chronic Combined CHF - Patient presented with worsening shortness of breath and lower extremity edema and was noted to be volume overloaded on exam.  - BNP elevated at 2,227. - Chest x-ray consistent with vascular congestion with increased right pleural effusion and right  basilar. - Echo showed LVEF of 35-405 with global hypokinesis, mild LVH, and indeterminate filling pressures with moderately elevated PA pressures and moderate TR. RV systolic function moderately reduced. Prior Echo in 09/2019 showed LVEF of 55-60%.  - Nephrology increased IV Lasix to 160mg  twice daily. He is also on Metolazone 5mg  daily. No documented urinary output yesterday due to problems with her purewick. Weight yesterday was 221 lbs, down from 260 lbs on admission (if accurate). Renal function stable and creatinine 3.61 today. - Still has significant lower extremity edema. -Will defer diuresis to Nephrology given renal function. - No ACEi/ARB/ARNI or MRA due to renal function. - Continue Coreg 25mg  twice daily. - Continue Imdur 30mg  daily. - Hydralazine was stopped yesterday by Nephrology to allow for more BP room since Lasix was increased. - Continue to monitor daily weights, strict I/O's, and renal function.  New Onset Atrial Flutter -Still in atrial flutter with rates in the 110's. -  Continue Coreg 25mg  twice daily. - CHA2DS2-VASc = 5 (CHF, HTN, DM, age, female). Not a candidate for chronic anticoagulation due to endometrial bleeding secondary to endometrial cancer. Not a candidate for DCCV due to absence of anticoagulation. Not a candidate for Digoxin due to poor renal function.   Elevated Troponin - High-sensitivity troponin minimally elevated and flat at 47 >> 40. - No chest pain.  - Likely demand ischemia in setting of CHF and atrial flutter with RVR.  Hypertension - BP well controlled. - Per Nephrology, we want to make sure we keep kidney perfused and don't drop BP too low. Hydralazine was stopped yesterday since Lasix was increased.  Acute on CKD Stage III - Creatinine2.48on admission. Peaked at 3.81on 2/1. Baseline around 2.0. - Creatinine stable at3.61 today. - Renal ultrasound showed atrophic left kidney with normal appearing right kidney. - Urinalysis shows  100 of protein as well as small amount of RBC and WBC in addition to epithelial cells so difficult to interpret. - Nephrology consulted. CKD felt to be due to cardiorenal syndrome vs atrophic kidney causing decreased nephron mass in the setting of HTN and DM. Acute change felt to be secondary to hemodynamic changes.  - Management per Nephrology.  Hypokalemia - Potassium slightly low at 3.2 today.  - Will defer supplementation to Nephrology given renal function.  Of note, palliative careconsulted and patient has agreed to outpatient palliative care to follow at [facility]. However, she continued to want full code and full care at this time.  Otherwise, per primary team: - Type 2 diabetes - Endometrial cancer - Anemia of chronic disease   The patient is very slowly improving, weight down to 100 kg from 115 kg on admission, we appreciate nephrology input- they increased lasix to 160 mg iv BID today and kept metolazone 5 mg po daily, crea has been stable at 3.6, hydralazine was held in order to keep BP > 120 mmHg in order to keep good kidney perfusion. She remains in atrial flutter but with improved ventricular rates in 100-110 BPM. Continue carvedilol 25 mg po BID. We will sign off, we will arrange for a follow up in our cardiology clinic in 3 weeks. Please call us with any questions.   Vanessa Dawley, MD 08/14/2020   Current medicines are reviewed with the patient today.  The patient does not have concerns regarding medicines other than what has been noted above.  The following changes have been made:  See above.  Labs/ tests ordered today include:   No orders of the defined types were placed in this encounter.    Disposition:   FU with *** in {gen number 1-61:096045} {Days to years:10300}.   Patient is agreeable to this plan and will call if any problems develop in the interim.   SignedTruitt Merle, NP  08/19/2020 7:52 AM  [place] [address] [place], [place]  [address] Phone: [phone] Fax: [phone]

## 2020-08-27 ENCOUNTER — Non-Acute Institutional Stay: Payer: Self-pay | Admitting: Nurse Practitioner

## 2020-08-27 ENCOUNTER — Other Ambulatory Visit: Payer: Self-pay

## 2020-08-27 DIAGNOSIS — Z515 Encounter for palliative care: Secondary | ICD-10-CM

## 2020-08-27 DIAGNOSIS — R600 Localized edema: Secondary | ICD-10-CM

## 2020-08-27 NOTE — Progress Notes (Addendum)
Vanessa [name] Note Telephone: [phone]  Fax: [phone]  PATIENT NAME: Vanessa [name] [id number] [phone] (home)  DOB: 1955-08-19 MRN: [id number]  PRIMARY CARE PROVIDER:     REFERRING PROVIDER:  Dr. Benita [name] [phone]   RESPONSIBLE PARTY:   Extended Emergency Contact Information Primary Emergency Contact: Vanessa [name] Relation: Daughter Secondary Emergency Contact: Vanessa [name] Phone: [phone] Mobile Phone: [phone] Relation: Spouse  I met face to face with patient in facility.  ASSESSMENT AND RECOMMENDATIONS:   Advance Care Planning: Today's visit consisted of building trust and discussions on Palliative care medicine as specialized medical care for people living with serious illness, aimed at facilitating improved quality of life through symptoms relief, assisting with advance care planning and establishing goals of care. Patient expressed appreciation for education provided on Palliative care and how it differs from Hospice service. Palliative care will continue to provide support to patient, family and the medical team. Goal of care: Patient's goal of care is comfort while preserving function. Patient want to be able to walk independently, getting in and out of bed without assistance. Directives: Patient's code status is DNR. She reiterated desire to not be resuscitated in the event of cardiac or respiratory arrest. DNR form signed for patient, form given to facility medical records personal, copy uploaded to [facility].  Symptom Management:  Patient with +4 lower extremity edema, mild weeping noted. Patient currently on Torsemide 50m twice a day. Staff report patient sits and sleeps in a chair, report bed is uncomfortable for her. Patient denied acute cough, denied SOB, denied uncontrolled pain. Denied  bleeding. Recommendation: Continue current regimen, Metolazone held during hospitalization, patient is to follow up with Nephrology as an outpatient. Continue daily weights. Continue to monitor blood pressures per current plan of care with adjustments to regimen as needed to meet goal of consistently controlled blood pressures. Monitor for worsening CKD such as changes in urination, Uremia and worsening swelling.  Follow up Palliative Care Visit: Palliative care will continue to follow for complex decision making and symptom management. Return in about 4-6 weeks or prn.  Family /Caregiver/Community Supports: [facility] Prior to hospitalization she lived with her daughter.  Cognitive / Functional decline: Patient awake, alert and coherent, requires moderate assistance completing her ADLs. Ambulates with a front wheel walker.  I spent 30 minutes providing this consultation, time includes time spent with patient, chart review, provider coordination, and documentation. More than 50% of the time in this consultation was spent counseling and coordinating communication.   CHIEF COMPLAINT: Lower extremity edema  History obtained from review of EMR, discussion with facility staff, and interview with patient. Records reviewed and summarized bellow.  Reviewed pertinent labs 08/18/2020: GFR 13, Cr 3.73  HISTORY OF PRESENT ILLNESS:  [name] a 65y.o. 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 Patient is s/p hospitalization from 08/07/2020 to 08/18/2020 for fluid over load, she was noted to have new onset of Afib with RVR. Renal ultrasound during hospitalization showed an atrophic left kidney. Palliative Care  was asked to follow this patient to help address advance care planning and goals of care. This is an  initial visit.  CODE STATUS: DNR  PPS: 50%  HOSPICE ELIGIBILITY/DIAGNOSIS: TBD  PHYSICAL EXAM / ROS:   Current and past weights: 212lbs down from 215lbs at hospital discharge 3 weeks ago. 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 Anemia 10/09/2019  . Asthma   . Cellulitis and abscess of lower extremity 04/22/2020  . CKD (chronic kidney disease) stage 3, GFR 30-59 ml/min (HCC)   . Dental disease   . Diabetes mellitus without complication ([place])   . Endometrial cancer ([place])   . Esophageal dysmotility   . Heart murmur   . HLD (hyperlipidemia)   . Hypertension   . MSSA bacteremia 09/2019  . Obesity, Class III, BMI 40-49.9 (morbid obesity) ([place])   . Pressure ulcer   . Vaginal bleeding 05/2020    SOCIAL HX:  Social History   Tobacco Use  . Smoking status: Former Research scientist (life sciences)  . Smokeless tobacco: Never Used  . Tobacco comment: " years ago ", >64yr  Substance Use Topics  . Alcohol use: Yes    Comment: occasional   FAMILY HX:  Family History  Problem Relation Age of Onset  . Stroke Mother   . Diabetes Mother   . Hypertension Mother   . Cancer Mother        possible ovarian  . Diabetes Brother   . Hypertension Brother   . Cancer Maternal Grandmother        possible ovarian    ALLERGIES:  Allergies  Allergen Reactions  . Ace Inhibitors Other (See  Comments)    Never prescribed but she has had angioedema  . Angiotensin Receptor Blockers Other (See Comments)    ARB never Rxed but PMH  of angioedema in March 2021     PERTINENT MEDICATIONS:  Outpatient Encounter Medications as of 08/27/2020  Medication Sig  . acetaminophen (TYLENOL) 500 MG tablet Take 500 mg by mouth every 6 (six) hours as needed (pain).   .Marland Kitchenaspirin 81 MG chewable tablet Chew 1 tablet (81 mg total) by mouth daily.  . carvedilol (COREG) 25 MG tablet Take 1 tablet (25 mg total) by mouth 2 (two) times daily with a meal.  . diphenhydrAMINE (BENADRYL) 25 MG tablet Take 25 mg by mouth daily as needed for itching or allergies.  . ferrous sulfate 325 (65 FE) MG tablet Take 1 tablet (325 mg total) by mouth 2 (two) times daily with a meal.  . hydrALAZINE (APRESOLINE) 50 MG tablet Take 1 tablet (50 mg total) by mouth every 8 (eight) hours.  . hydrocerin (EUCERIN) CREA Apply 1 application topically daily. (Patient not taking: No sig reported)  . hydrocortisone (ANUSOL-HC) 2.5 % rectal cream Place 1 application rectally every 12 (twelve) hours as needed for hemorrhoids.  . hydrOXYzine (ATARAX/VISTARIL) 25 MG tablet Take 1 tablet (25 mg total) by mouth 3 (three) times daily as needed for itching.  . isosorbide mononitrate (IMDUR) 30 MG 24 hr tablet Take 1 tablet (30 mg total) by mouth daily.  .Marland Kitchenloratadine (CLARITIN) 10 MG tablet Take 1 tablet (10 mg total)  by mouth daily as needed for allergies.  Marland Kitchen nutrition supplement, JUVEN, (JUVEN) PACK Take 1 packet by mouth 2 (two) times daily between meals.  . polyethylene glycol (MIRALAX / GLYCOLAX) 17 g packet Take 17 g by mouth daily. (Patient taking differently: Take 17 g by mouth daily as needed for mild constipation.)  . potassium chloride 20 MEQ TBCR Take 10 mEq by mouth 2 (two) times daily.  Marland Kitchen torsemide (DEMADEX) 10 MG tablet Take 5 tablets (50 mg total) by mouth 2 (two) times daily.  Marland Kitchen witch hazel-glycerin (TUCKS) pad Apply topically as  needed for itching, hemorrhoids or irritation.   No facility-administered encounter medications on file as of 08/27/2020.    Thank you for the opportunity to participate in the care of [name]. The palliative care team will continue to follow. Please call our office at [phone] if we can be of additional assistance.   Jari Favre, DNP, AGPCNP-BC

## 2020-08-31 NOTE — Progress Notes (Signed)
This encounter was created in error - please disregard.

## 2020-09-02 ENCOUNTER — Ambulatory Visit: Payer: Medicare Other | Admitting: Nurse Practitioner

## 2020-09-24 ENCOUNTER — Encounter (HOSPITAL_COMMUNITY): Payer: Self-pay

## 2020-09-24 ENCOUNTER — Emergency Department (HOSPITAL_COMMUNITY): Payer: Medicare Other

## 2020-09-24 ENCOUNTER — Inpatient Hospital Stay (HOSPITAL_COMMUNITY)
Admission: EM | Admit: 2020-09-24 | Discharge: 2020-10-14 | DRG: 291 | Disposition: A | Discharge status: Hospice | Payer: Medicare Other | Source: Skilled Nursing Facility | Attending: Internal Medicine | Admitting: Internal Medicine

## 2020-09-24 DIAGNOSIS — Z833 Family history of diabetes mellitus: Secondary | ICD-10-CM

## 2020-09-24 DIAGNOSIS — I50811 Acute right heart failure: Secondary | ICD-10-CM

## 2020-09-24 DIAGNOSIS — Z923 Personal history of irradiation: Secondary | ICD-10-CM

## 2020-09-24 DIAGNOSIS — Z1619 Resistance to other specified beta lactam antibiotics: Secondary | ICD-10-CM | POA: Diagnosis not present

## 2020-09-24 DIAGNOSIS — Z79899 Other long term (current) drug therapy: Secondary | ICD-10-CM

## 2020-09-24 DIAGNOSIS — Z20822 Contact with and (suspected) exposure to covid-19: Secondary | ICD-10-CM | POA: Diagnosis present

## 2020-09-24 DIAGNOSIS — N184 Chronic kidney disease, stage 4 (severe): Secondary | ICD-10-CM | POA: Diagnosis present

## 2020-09-24 DIAGNOSIS — I471 Supraventricular tachycardia: Secondary | ICD-10-CM | POA: Diagnosis not present

## 2020-09-24 DIAGNOSIS — Z7982 Long term (current) use of aspirin: Secondary | ICD-10-CM

## 2020-09-24 DIAGNOSIS — I130 Hypertensive heart and chronic kidney disease with heart failure and stage 1 through stage 4 chronic kidney disease, or unspecified chronic kidney disease: Principal | ICD-10-CM | POA: Diagnosis present

## 2020-09-24 DIAGNOSIS — R188 Other ascites: Secondary | ICD-10-CM | POA: Diagnosis present

## 2020-09-24 DIAGNOSIS — N390 Urinary tract infection, site not specified: Secondary | ICD-10-CM | POA: Diagnosis not present

## 2020-09-24 DIAGNOSIS — N179 Acute kidney failure, unspecified: Secondary | ICD-10-CM | POA: Diagnosis present

## 2020-09-24 DIAGNOSIS — Z515 Encounter for palliative care: Secondary | ICD-10-CM

## 2020-09-24 DIAGNOSIS — D630 Anemia in neoplastic disease: Secondary | ICD-10-CM | POA: Diagnosis present

## 2020-09-24 DIAGNOSIS — I5082 Biventricular heart failure: Secondary | ICD-10-CM | POA: Diagnosis present

## 2020-09-24 DIAGNOSIS — D61818 Other pancytopenia: Secondary | ICD-10-CM | POA: Diagnosis present

## 2020-09-24 DIAGNOSIS — T829XXA Unspecified complication of cardiac and vascular prosthetic device, implant and graft, initial encounter: Secondary | ICD-10-CM

## 2020-09-24 DIAGNOSIS — I509 Heart failure, unspecified: Secondary | ICD-10-CM

## 2020-09-24 DIAGNOSIS — D631 Anemia in chronic kidney disease: Secondary | ICD-10-CM | POA: Diagnosis present

## 2020-09-24 DIAGNOSIS — I10 Essential (primary) hypertension: Secondary | ICD-10-CM | POA: Diagnosis present

## 2020-09-24 DIAGNOSIS — D696 Thrombocytopenia, unspecified: Secondary | ICD-10-CM | POA: Diagnosis present

## 2020-09-24 DIAGNOSIS — E785 Hyperlipidemia, unspecified: Secondary | ICD-10-CM | POA: Diagnosis present

## 2020-09-24 DIAGNOSIS — I4892 Unspecified atrial flutter: Secondary | ICD-10-CM | POA: Diagnosis present

## 2020-09-24 DIAGNOSIS — I5043 Acute on chronic combined systolic (congestive) and diastolic (congestive) heart failure: Secondary | ICD-10-CM | POA: Diagnosis present

## 2020-09-24 DIAGNOSIS — E1122 Type 2 diabetes mellitus with diabetic chronic kidney disease: Secondary | ICD-10-CM | POA: Diagnosis present

## 2020-09-24 DIAGNOSIS — E1165 Type 2 diabetes mellitus with hyperglycemia: Secondary | ICD-10-CM | POA: Diagnosis present

## 2020-09-24 DIAGNOSIS — Z8614 Personal history of Methicillin resistant Staphylococcus aureus infection: Secondary | ICD-10-CM

## 2020-09-24 DIAGNOSIS — Z1612 Extended spectrum beta lactamase (ESBL) resistance: Secondary | ICD-10-CM | POA: Diagnosis not present

## 2020-09-24 DIAGNOSIS — Z823 Family history of stroke: Secondary | ICD-10-CM

## 2020-09-24 DIAGNOSIS — B9620 Unspecified Escherichia coli [E. coli] as the cause of diseases classified elsewhere: Secondary | ICD-10-CM | POA: Diagnosis not present

## 2020-09-24 DIAGNOSIS — I272 Pulmonary hypertension, unspecified: Secondary | ICD-10-CM | POA: Diagnosis present

## 2020-09-24 DIAGNOSIS — Z888 Allergy status to other drugs, medicaments and biological substances status: Secondary | ICD-10-CM

## 2020-09-24 DIAGNOSIS — C541 Malignant neoplasm of endometrium: Secondary | ICD-10-CM | POA: Diagnosis present

## 2020-09-24 DIAGNOSIS — E876 Hypokalemia: Secondary | ICD-10-CM | POA: Diagnosis present

## 2020-09-24 DIAGNOSIS — R34 Anuria and oliguria: Secondary | ICD-10-CM | POA: Diagnosis not present

## 2020-09-24 DIAGNOSIS — D500 Iron deficiency anemia secondary to blood loss (chronic): Secondary | ICD-10-CM | POA: Diagnosis present

## 2020-09-24 DIAGNOSIS — N189 Chronic kidney disease, unspecified: Secondary | ICD-10-CM | POA: Diagnosis present

## 2020-09-24 DIAGNOSIS — Z8249 Family history of ischemic heart disease and other diseases of the circulatory system: Secondary | ICD-10-CM

## 2020-09-24 DIAGNOSIS — Z87891 Personal history of nicotine dependence: Secondary | ICD-10-CM

## 2020-09-24 DIAGNOSIS — I480 Paroxysmal atrial fibrillation: Secondary | ICD-10-CM | POA: Diagnosis present

## 2020-09-24 DIAGNOSIS — Z6841 Body Mass Index (BMI) 40.0 and over, adult: Secondary | ICD-10-CM

## 2020-09-24 DIAGNOSIS — Z66 Do not resuscitate: Secondary | ICD-10-CM | POA: Diagnosis present

## 2020-09-24 LAB — BRAIN NATRIURETIC PEPTIDE: B Natriuretic Peptide: 1724.9 pg/mL — ABNORMAL HIGH (ref 0.0–100.0)

## 2020-09-24 LAB — CBC
HCT: 33.7 % — ABNORMAL LOW (ref 36.0–46.0)
Hemoglobin: 10.6 g/dL — ABNORMAL LOW (ref 12.0–15.0)
MCH: 29 pg (ref 26.0–34.0)
MCHC: 31.5 g/dL (ref 30.0–36.0)
MCV: 92.3 fL (ref 80.0–100.0)
Platelets: 102 10*3/uL — ABNORMAL LOW (ref 150–400)
RBC: 3.65 MIL/uL — ABNORMAL LOW (ref 3.87–5.11)
RDW: 17.3 % — ABNORMAL HIGH (ref 11.5–15.5)
WBC: 3.1 10*3/uL — ABNORMAL LOW (ref 4.0–10.5)
nRBC: 2.6 % — ABNORMAL HIGH (ref 0.0–0.2)

## 2020-09-24 LAB — BASIC METABOLIC PANEL
Anion gap: 12 (ref 5–15)
BUN: 81 mg/dL — ABNORMAL HIGH (ref 8–23)
CO2: 27 mmol/L (ref 22–32)
Calcium: 7.9 mg/dL — ABNORMAL LOW (ref 8.9–10.3)
Chloride: 105 mmol/L (ref 98–111)
Creatinine, Ser: 4.31 mg/dL — ABNORMAL HIGH (ref 0.44–1.00)
GFR, Estimated: 11 mL/min — ABNORMAL LOW (ref >60)
Glucose, Bld: 134 mg/dL — ABNORMAL HIGH (ref 70–99)
Potassium: 3.6 mmol/L (ref 3.5–5.1)
Sodium: 144 mmol/L (ref 135–145)

## 2020-09-24 LAB — TROPONIN I (HIGH SENSITIVITY)
Troponin I (High Sensitivity): 36 ng/L — ABNORMAL HIGH (ref <18)
Troponin I (High Sensitivity): 37 ng/L — ABNORMAL HIGH (ref <18)

## 2020-09-24 LAB — RESP PANEL BY RT-PCR (FLU A&B, COVID) ARPGX2
Influenza A by PCR: NEGATIVE
Influenza B by PCR: NEGATIVE
SARS Coronavirus 2 by RT PCR: NEGATIVE

## 2020-09-24 MED ORDER — FERROUS SULFATE 325 (65 FE) MG PO TABS
325.0000 mg | ORAL_TABLET | Freq: Two times a day (BID) | ORAL | Status: DC
  Administered 2020-09-25 – 2020-10-14 (×40): 325 mg via ORAL
  Filled 2020-09-24 (×40): qty 1

## 2020-09-24 MED ORDER — ONDANSETRON HCL 4 MG/2ML IJ SOLN: 4.0000 mg | Freq: Four times a day (QID) | INTRAMUSCULAR | Status: DC | PRN

## 2020-09-24 MED ORDER — CARVEDILOL 25 MG PO TABS
25.0000 mg | ORAL_TABLET | Freq: Two times a day (BID) | ORAL | Status: DC
  Administered 2020-09-25 – 2020-10-14 (×40): 25 mg via ORAL
  Filled 2020-09-24 (×40): qty 1

## 2020-09-24 MED ORDER — HYDRALAZINE HCL 50 MG PO TABS
50.0000 mg | ORAL_TABLET | Freq: Three times a day (TID) | ORAL | Status: DC
  Administered 2020-09-25 – 2020-10-14 (×56): 50 mg via ORAL
  Filled 2020-09-24 (×57): qty 1

## 2020-09-24 MED ORDER — ISOSORBIDE MONONITRATE ER 30 MG PO TB24
30.0000 mg | ORAL_TABLET | Freq: Every day | ORAL | Status: DC
  Administered 2020-09-25 – 2020-10-14 (×20): 30 mg via ORAL
  Filled 2020-09-24 (×20): qty 1

## 2020-09-24 MED ORDER — POLYETHYLENE GLYCOL 3350 17 G PO PACK
17.0000 g | PACK | Freq: Every day | ORAL | Status: DC | PRN
  Administered 2020-09-30: 17 g via ORAL
  Filled 2020-09-24: qty 1

## 2020-09-24 MED ORDER — JUVEN PO PACK
1.0000 | PACK | Freq: Two times a day (BID) | ORAL | Status: DC
  Administered 2020-09-25 – 2020-10-14 (×27): 1 via ORAL
  Filled 2020-09-24 (×35): qty 1

## 2020-09-24 MED ORDER — SODIUM CHLORIDE 0.9% FLUSH
3.0000 mL | Freq: Two times a day (BID) | INTRAVENOUS | Status: DC
  Administered 2020-09-25 – 2020-10-14 (×35): 3 mL via INTRAVENOUS

## 2020-09-24 MED ORDER — POTASSIUM CHLORIDE CRYS ER 10 MEQ PO TBCR
10.0000 meq | EXTENDED_RELEASE_TABLET | Freq: Two times a day (BID) | ORAL | Status: DC
  Administered 2020-09-25 – 2020-10-01 (×15): 10 meq via ORAL
  Filled 2020-09-24 (×16): qty 1

## 2020-09-24 MED ORDER — FUROSEMIDE 10 MG/ML IJ SOLN
60.0000 mg | Freq: Once | INTRAMUSCULAR | Status: AC
  Administered 2020-09-24: 60 mg via INTRAVENOUS
  Filled 2020-09-24: qty 6

## 2020-09-24 MED ORDER — ACETAMINOPHEN 325 MG PO TABS
650.0000 mg | ORAL_TABLET | Freq: Four times a day (QID) | ORAL | Status: DC | PRN
  Administered 2020-09-25 – 2020-10-14 (×24): 650 mg via ORAL
  Filled 2020-09-24 (×29): qty 2

## 2020-09-24 MED ORDER — FUROSEMIDE 10 MG/ML IJ SOLN
40.0000 mg | Freq: Two times a day (BID) | INTRAMUSCULAR | Status: DC
  Administered 2020-09-25 – 2020-09-27 (×6): 40 mg via INTRAVENOUS
  Filled 2020-09-24 (×6): qty 4

## 2020-09-24 MED ORDER — ACETAMINOPHEN 650 MG RE SUPP: 650.0000 mg | Freq: Four times a day (QID) | RECTAL | Status: DC | PRN

## 2020-09-24 MED ORDER — ONDANSETRON HCL 4 MG PO TABS: 4.0000 mg | ORAL_TABLET | Freq: Four times a day (QID) | ORAL | Status: DC | PRN

## 2020-09-24 MED ORDER — HYDROXYZINE HCL 25 MG PO TABS
25.0000 mg | ORAL_TABLET | Freq: Three times a day (TID) | ORAL | Status: DC | PRN
  Administered 2020-09-25 – 2020-10-14 (×26): 25 mg via ORAL
  Filled 2020-09-24 (×27): qty 1

## 2020-09-24 NOTE — ED Triage Notes (Signed)
Pt BIB EMS from [facility] . Per facility pt started having cp,sob,weakness,dizziness.Per EMS she also had some abnormal labs. Pt is end-stage renal disease but hasn't started dialysis.

## 2020-09-24 NOTE — ED Provider Notes (Signed)
[facility] EMERGENCY DEPARTMENT Provider Note   CSN: [id number] Arrival date & time: 09/24/20  1436     History Chief Complaint  Patient presents with  . Chest Pain    Vanessa [name] is a 65 y.o. female.  Patient is a 65 year old man with a history of endometrial cancer diagnosed in 2021, hypertension, hyperlipidemia, esophageal dysmotility, type 2 diabetes, CKD 3, asthma, anemia of chronic disease who presents with chest pain.  She reports her chest pain is diffuse and pressure-like in nature.  She describes as a tightness.  She denies any radiation to the neck, back, arm.  States been going on for 2 days.  It is intermittent.  She states that today it was worse than it had been previously.  Currently is an 8 out of 10 pain.  She has not tried any medications to make it feel better.  She states that it gets worse when she moves around.  Patient has been compliant with all of her medications including her diuretics.  With increasing pain today, patient decided to present to the emergency department.  She is coming from her living rehabilitation facility.  She denies any abdominal pain, nausea, diarrhea.  Denies any diaphoresis.  States that she is nearing the need for dialysis.  She does make adequate urine with all of her diuretics.        Past Medical History:  Diagnosis Date  . Anemia 10/09/2019  . Asthma   . Cellulitis and abscess of lower extremity 04/22/2020  . CKD (chronic kidney disease) stage 3, GFR 30-59 ml/min (HCC)   . Dental disease   . Diabetes mellitus without complication ([place])   . Endometrial cancer ([place])   . Esophageal dysmotility   . Heart murmur   . HLD (hyperlipidemia)   . Hypertension   . MSSA bacteremia 09/2019  . Obesity, Class III, BMI 40-49.9 (morbid obesity) ([place])   . Pressure ulcer   . Vaginal bleeding 05/2020    Patient Active Problem List   Diagnosis Date Noted  . Acute on chronic combined systolic (congestive) and  diastolic (congestive) heart failure ([place]) 09/24/2020  . Acute respiratory failure with hypoxia ([place]) 08/08/2020  . Acute diastolic CHF (congestive heart failure) ([place]) 08/08/2020  . Elevated troponin 08/08/2020  . Abnormal uterine bleeding due to primary malignant neoplasm of endometrium ([place]) 05/24/2020  . Cellulitis 04/23/2020  . Bilateral lower leg cellulitis 04/22/2020  . Neurocognitive deficits 12/11/2019  . Edema 12/04/2019  . Acute kidney injury superimposed on CKD ([place]) 12/03/2019  . Hypothermia 11/26/2019  . CKD (chronic kidney disease) stage 3, GFR 30-59 ml/min (HCC)   . Hypernatremia   . COVID-19 virus infection   . Generalized weakness   . Hypertensive urgency   . Allergic angioedema 10/19/2019  . Anasarca 10/18/2019  . Angioedema 10/18/2019  . Hypertensive emergency 10/18/2019  . Trichimoniasis 10/17/2019  . Macrocytic anemia 10/05/2019  . Esophageal dysmotility 10/04/2019  . Pressure injury of skin 09/22/2019  . Endometrial cancer ([place]) 09/21/2019  . Left humeral fracture 09/21/2019  . Iron deficiency anemia due to chronic blood loss 09/21/2019  . MSSA bacteremia 09/18/2019  . Postmenopausal bleeding 09/15/2019  . Thrombocytopenia ([place]) 09/15/2019  . Generalized pruritus 09/15/2019  . Benign hypertension with CKD (chronic kidney disease) stage III ([place]) 09/15/2019  . Hyperuricemia 09/15/2019  . Hypokalemia 09/15/2019  . Symptomatic anemia 09/14/2019  . Non compliance w medication regimen 03/24/2015  . Dyslipidemia 03/24/2015  . DM type 2 (diabetes mellitus, type  2) ([place]) 05/23/2014  . HTN (hypertension) 05/23/2014    Past Surgical History:  Procedure Laterality Date  . BIOPSY  10/08/2019   Procedure: BIOPSY;  Surgeon: Clarene Essex, MD;  Location: [facility] ENDOSCOPY;  Service: Endoscopy;;  . ESOPHAGOGASTRODUODENOSCOPY N/A 10/08/2019   Procedure: ESOPHAGOGASTRODUODENOSCOPY (EGD);  Surgeon: Clarene Essex, MD;  Location: [facility];  Service: Endoscopy;  Laterality:  N/A;  . TYMPANOSTOMY TUBE PLACEMENT Bilateral      OB History    Gravida  5   Para  5   Term  4   Preterm  1   AB      Living  5     SAB      IAB      Ectopic      Multiple      Live Births  5           Family History  Problem Relation Age of Onset  . Stroke Mother   . Diabetes Mother   . Hypertension Mother   . Cancer Mother        possible ovarian  . Diabetes Brother   . Hypertension Brother   . Cancer Maternal Grandmother        possible ovarian    Social History   Tobacco Use  . Smoking status: Former Research scientist (life sciences)  . Smokeless tobacco: Never Used  . Tobacco comment: " years ago ", >55yrs  Vaping Use  . Vaping Use: Never used  Substance Use Topics  . Alcohol use: Yes    Comment: occasional  . Drug use: No    Home Medications Prior to Admission medications   Medication Sig Start Date End Date Taking? Authorizing Provider  acetaminophen (TYLENOL) 500 MG tablet Take 500 mg by mouth every 6 (six) hours as needed (pain).     [provider]  aspirin 81 MG chewable tablet Chew 1 tablet (81 mg total) by mouth daily. 08/18/20   Cristal Ford, DO  carvedilol (COREG) 25 MG tablet Take 1 tablet (25 mg total) by mouth 2 (two) times daily with a meal. 08/18/20   Cristal Ford, DO  diphenhydrAMINE (BENADRYL) 25 MG tablet Take 25 mg by mouth daily as needed for itching or allergies.    [provider]  ferrous sulfate 325 (65 FE) MG tablet Take 1 tablet (325 mg total) by mouth 2 (two) times daily with a meal. 12/13/19   Medina-Vargas, Monina C, NP  hydrALAZINE (APRESOLINE) 50 MG tablet Take 1 tablet (50 mg total) by mouth every 8 (eight) hours. 08/18/20   Mikhail, Velta Addison, DO  hydrocerin (EUCERIN) CREA Apply 1 application topically daily. Patient not taking: No sig reported 04/29/20   Swayze, Ava, DO  hydrocortisone (ANUSOL-HC) 2.5 % rectal cream Place 1 application rectally every 12 (twelve) hours as needed for hemorrhoids. 08/18/20   Cristal Ford, DO  hydrOXYzine (ATARAX/VISTARIL) 25 MG tablet Take 1 tablet (25 mg total) by mouth 3 (three) times daily as needed for itching. 08/18/20   Mikhail, Velta Addison, DO  isosorbide mononitrate (IMDUR) 30 MG 24 hr tablet Take 1 tablet (30 mg total) by mouth daily. 08/19/20   Mikhail, Velta Addison, DO  loratadine (CLARITIN) 10 MG tablet Take 1 tablet (10 mg total) by mouth daily as needed for allergies. 12/13/19   Medina-Vargas, Monina C, NP  nutrition supplement, JUVEN, (JUVEN) PACK Take 1 packet by mouth 2 (two) times daily between meals. 04/29/20   Swayze, Ava, DO  polyethylene glycol (MIRALAX / GLYCOLAX) 17 g packet Take 17  g by mouth daily. Patient taking differently: Take 17 g by mouth daily as needed for mild constipation. 09/25/19   Arrien, Jimmy Picket, MD  potassium chloride 20 MEQ TBCR Take 10 mEq by mouth 2 (two) times daily. 08/18/20   Mikhail, Velta Addison, DO  torsemide (DEMADEX) 10 MG tablet Take 5 tablets (50 mg total) by mouth 2 (two) times daily. 08/18/20   Cristal Ford, DO  witch hazel-glycerin (TUCKS) pad Apply topically as needed for itching, hemorrhoids or irritation. 08/18/20   Cristal Ford, DO    Allergies    Ace inhibitors and Angiotensin receptor blockers  Review of Systems   Review of Systems  Constitutional: Negative for chills and fever.  HENT: Negative for ear pain and sore throat.   Eyes: Negative for pain and visual disturbance.  Respiratory: Positive for shortness of breath. Negative for cough.   Cardiovascular: Positive for chest pain and leg swelling.  Gastrointestinal: Negative for abdominal pain and vomiting.  Genitourinary: Negative for dysuria and hematuria.  Musculoskeletal: Negative for arthralgias and back pain.  Skin: Negative for color change and rash.  Neurological: Negative for seizures and syncope.  All other systems reviewed and are negative.   Physical Exam Updated Vital Signs BP (!) 148/101   Pulse (!) 115   Temp 98.3 F (36.8 C) (Oral)   Resp  14   LMP 03/17/2015   SpO2 100%   Physical Exam Vitals and nursing note reviewed.  Constitutional:      Appearance: She is well-developed. She is obese.  HENT:     Head: Normocephalic and atraumatic.  Eyes:     Conjunctiva/sclera: Conjunctivae normal.  Cardiovascular:     Rate and Rhythm: Normal rate and regular rhythm.     Pulses:          Radial pulses are 2+ on the right side and 2+ on the left side.  Pulmonary:     Effort: Pulmonary effort is normal. No respiratory distress.     Breath sounds: Rales present.  Abdominal:     Palpations: Abdomen is soft.     Tenderness: There is no abdominal tenderness.  Musculoskeletal:     Cervical back: Neck supple.     Right lower leg: No tenderness. Edema present.     Left lower leg: No tenderness. Edema present.  Skin:    General: Skin is warm and dry.  Neurological:     Mental Status: She is alert.     ED Results / Procedures / Treatments   Labs (all labs ordered are listed, but only abnormal results are displayed) Labs Reviewed  BASIC METABOLIC PANEL - Abnormal; Notable for the following components:      Result Value   Glucose, Bld 134 (*)    BUN 81 (*)    Creatinine, Ser 4.31 (*)    Calcium 7.9 (*)    GFR, Estimated 11 (*)    All other components within normal limits  CBC - Abnormal; Notable for the following components:   WBC 3.1 (*)    RBC 3.65 (*)    Hemoglobin 10.6 (*)    HCT 33.7 (*)    RDW 17.3 (*)    Platelets 102 (*)    nRBC 2.6 (*)    All other components within normal limits  BRAIN NATRIURETIC PEPTIDE - Abnormal; Notable for the following components:   B Natriuretic Peptide 1,724.9 (*)    All other components within normal limits  TROPONIN I (HIGH SENSITIVITY) - Abnormal; Notable for the  following components:   Troponin I (High Sensitivity) 36 (*)    All other components within normal limits  TROPONIN I (HIGH SENSITIVITY) - Abnormal; Notable for the following components:   Troponin I (High Sensitivity)  37 (*)    All other components within normal limits  RESP PANEL BY RT-PCR (FLU A&B, COVID) [id number]    EKG EKG Interpretation  Date/Time:  Thursday September 24 2020 14:39:10 EDT Ventricular Rate:  118 PR Interval:    QRS Duration: 78 QT Interval:  342 QTC Calculation: 479 R Axis:   149 Text Interpretation: Accelerated Junctional rhythm Right axis deviation Low voltage QRS No significant change since last tracing Confirmed by Lennice Sites [phone]) on 09/24/2020 6:29:08 PM   Radiology DG Chest 2 View  Result Date: 09/24/2020 CLINICAL DATA:  Chest pain, shortness of breath and weakness. EXAM: CHEST - 2 VIEW COMPARISON:  August 07, 2020 FINDINGS: Decreased lung volumes are noted. Mild atelectasis and/or infiltrate is seen within the right lung base. There is a small right pleural effusion. The cardiac silhouette is markedly enlarged and unchanged in size. Degenerative changes seen throughout the thoracic spine and bilateral shoulders. IMPRESSION: 1. Stable cardiomegaly with mild right basilar atelectasis and/or infiltrate. 2. Small right pleural effusion. Electronically Signed   By: Virgina Norfolk M.D.   On: 09/24/2020 15:23    Procedures Procedures   Medications Ordered in ED Medications  furosemide (LASIX) injection 60 mg (60 mg Intravenous Given 09/24/20 1953)   ED Course  I have reviewed the triage vital signs and the nursing notes.  Pertinent labs & imaging results that were available during my care of the patient were reviewed by me and considered in my medical decision making (see chart for details).    MDM Rules/Calculators/A&P                          65 year old female who presents with chest pain.  Work-up consistent with volume overload likely heart failure exacerbation.  This is complicated by patient's worsening kidney disease.  BNP elevated today.  Bilateral lower extremity edema.  Worsening kidney function noted.  Patient with respiratory distress on exam, however no  oxygen requirements.  Persistently tachycardic in the emergency department.  Given a dose of Lasix for treatment of volume overload.  EKG with signs of A. fib with RVR, no acute ischemic changes.  Troponin mildly elevated at 36 and 37.  Covid negative.  Patient reports changes of her diuretic medications in the outpatient settings, however this is not improved her symptoms.  States that her chest pain got significantly worse over the last 2 days and specifically just prior to arrival to the emergency department.  With volume overload, failure for outpatient heart failure treatment, and worsening kidney function, will admit to hospitalist service for further work-up and evaluation of her presenting symptoms.  Final Clinical Impression(s) / ED Diagnoses Final diagnoses:  Acute right-sided heart failure [facility])    Rx / [place] Orders ED Discharge Orders    None       Delno Blaisdell, [place], MD 09/24/20 [place], [place], DO 09/24/20 2246

## 2020-09-24 NOTE — ED Provider Notes (Signed)
I have personally seen and examined the patient. I have reviewed the documentation on PMH/FH/Soc Hx. I have discussed the plan of care with the resident and patient.  I have reviewed and agree with the resident's documentation. Please see associated encounter note.  Briefly, the patient is a 65 y.o. female here with shortness of breath, chest pain, volume overload.  Appears to be in volume overload likely secondary to heart failure and CKD.  Not on dialysis yet.  Patient has increased home torsemide but still feeling symptomatic.  Has pitting edema bilaterally probably 2-3+.  Pleural effusion and volume overload on chest x-ray.  Heart rate is in the 120s.  Likely in junctional rhythm versus an A. fib rhythm which has had a history of here recently.  She is on blood thinners.  Troponin is mildly elevated at 37.  BNP elevated to about 2000.  Creatinine slightly up above baseline.  Overall think she would benefit from aggressive diuresis and will need close watching given her history of CKD.  Will admit to medicine for further care.  Doubt ACS.  This chart was dictated using voice recognition software.  Despite best efforts to proofread,  errors can occur which can change the documentation meaning.     EKG Interpretation  Date/Time:  Thursday September 24 2020 14:39:10 EDT Ventricular Rate:  118 PR Interval:    QRS Duration: 78 QT Interval:  342 QTC Calculation: 479 R Axis:   149 Text Interpretation: Accelerated Junctional rhythm Right axis deviation Low voltage QRS No significant change since last tracing Confirmed by Lennice Sites [phone]) on 09/24/2020 6:29:08 PM         Lennice Sites, DO 09/24/20 2056

## 2020-09-24 NOTE — H&P (Signed)
History and Physical    Vanessa [name] XBJ:[id number] DOB: Jan 10, 1956 DOA: 09/24/2020  PCP: Patient, No Pcp Per  Patient coming from: SNF via EMS  I have personally briefly reviewed patient's old medical records in [place]  Chief Complaint: Shortness of breath  HPI: Vanessa [name] is a 65 y.o. female with medical history significant for chronic combined systolic and diastolic CHF (EF 30-86% by TTE 08/08/2020), atrial fibrillation (not on anticoagulation due to chronic bleeding from endometrial cancer), CKD stage IV, T2DM, HTN, anemia of chronic disease, hx of MSSA bacteremia, stage I endometrial cancer s/p palliative radiation who presents to the ED for evaluation of shortness of breath and peripheral edema.  Patient recently admitted from 08/07/2020-08/18/2020 for acute on chronic combined systolic and diastolic CHF exacerbation, AKI on CKD stage IV, and new onset A. fib with RVR.  Patient was managed with IV diuresis before transition to oral torsemide.  A. fib/flutter was controlled with Coreg.  Anticoagulation not started due to high risk of bleeding.  Patient was ultimately discharged to SNF with palliative care.  Patient reports worsening shortness of breath over the last few days.  She has been experiencing orthopnea and dyspnea on exertion.  She has seen increased swelling in both of her legs.  She reports intermittent chest pain but none currently.  She reports good urine output without dysuria.  She has not had any increased abdominal swelling compared to her baseline.  She denies any obvious bleeding.  ED Course:  Initial vitals showed BP 142/99, pulse 82, RR 18, temp 98.3 F, SPO2 97% on room air.  Labs show sodium 144, potassium 3.6, bicarb 27, BUN 81, creatinine 4.31 (baseline creatinine 3.6-3.7), serum glucose 134, WBC 3.1, hemoglobin 10.6, platelets 102,000, BNP 1724.9, high-sensitivity troponin I 36 > 37.  SARS-CoV-2 PCR is negative.  Influenza A/B PCR's are  negative.  2 view chest x-ray shows cardiomegaly with mild atelectasis of the right lung base, small right pleural effusion.  Patient was given IV Lasix 60 mg once and the hospitalist service was consulted to admit for further evaluation and management.  Review of Systems: All systems reviewed and are negative except as documented in history of present illness above.   Past Medical History:  Diagnosis Date  . Anemia 10/09/2019  . Asthma   . Cellulitis and abscess of lower extremity 04/22/2020  . CKD (chronic kidney disease) stage 3, GFR 30-59 ml/min (HCC)   . Dental disease   . Diabetes mellitus without complication ([place])   . Endometrial cancer ([place])   . Esophageal dysmotility   . Heart murmur   . HLD (hyperlipidemia)   . Hypertension   . MSSA bacteremia 09/2019  . Obesity, Class III, BMI 40-49.9 (morbid obesity) ([place])   . Pressure ulcer   . Vaginal bleeding 05/2020    Past Surgical History:  Procedure Laterality Date  . BIOPSY  10/08/2019   Procedure: BIOPSY;  Surgeon: Clarene Essex, MD;  Location: [facility] ENDOSCOPY;  Service: Endoscopy;;  . ESOPHAGOGASTRODUODENOSCOPY N/A 10/08/2019   Procedure: ESOPHAGOGASTRODUODENOSCOPY (EGD);  Surgeon: Clarene Essex, MD;  Location: [facility];  Service: Endoscopy;  Laterality: N/A;  . TYMPANOSTOMY TUBE PLACEMENT Bilateral     Social History:  reports that she has quit smoking. She has never used smokeless tobacco. She reports current alcohol use. She reports that she does not use drugs.  Allergies  Allergen Reactions  . Ace Inhibitors Other (See Comments)    Never prescribed but she has had angioedema  .  Angiotensin Receptor Blockers Other (See Comments)    ARB never Rxed but PMH  of angioedema in March 2021    Family History  Problem Relation Age of Onset  . Stroke Mother   . Diabetes Mother   . Hypertension Mother   . Cancer Mother        possible ovarian  . Diabetes Brother   . Hypertension Brother   . Cancer Maternal  Grandmother        possible ovarian     Prior to Admission medications   Medication Sig Start Date End Date Taking? Authorizing Provider  acetaminophen (TYLENOL) 500 MG tablet Take 500 mg by mouth every 6 (six) hours as needed (pain).     [provider]  aspirin 81 MG chewable tablet Chew 1 tablet (81 mg total) by mouth daily. 08/18/20   Cristal Ford, DO  carvedilol (COREG) 25 MG tablet Take 1 tablet (25 mg total) by mouth 2 (two) times daily with a meal. 08/18/20   Cristal Ford, DO  diphenhydrAMINE (BENADRYL) 25 MG tablet Take 25 mg by mouth daily as needed for itching or allergies.    [provider]  ferrous sulfate 325 (65 FE) MG tablet Take 1 tablet (325 mg total) by mouth 2 (two) times daily with a meal. 12/13/19   Medina-Vargas, Monina C, NP  hydrALAZINE (APRESOLINE) 50 MG tablet Take 1 tablet (50 mg total) by mouth every 8 (eight) hours. 08/18/20   Mikhail, Velta Addison, DO  hydrocerin (EUCERIN) CREA Apply 1 application topically daily. Patient not taking: No sig reported 04/29/20   Swayze, Ava, DO  hydrocortisone (ANUSOL-HC) 2.5 % rectal cream Place 1 application rectally every 12 (twelve) hours as needed for hemorrhoids. 08/18/20   Cristal Ford, DO  hydrOXYzine (ATARAX/VISTARIL) 25 MG tablet Take 1 tablet (25 mg total) by mouth 3 (three) times daily as needed for itching. 08/18/20   Mikhail, Velta Addison, DO  isosorbide mononitrate (IMDUR) 30 MG 24 hr tablet Take 1 tablet (30 mg total) by mouth daily. 08/19/20   Mikhail, Velta Addison, DO  loratadine (CLARITIN) 10 MG tablet Take 1 tablet (10 mg total) by mouth daily as needed for allergies. 12/13/19   Medina-Vargas, Monina C, NP  nutrition supplement, JUVEN, (JUVEN) PACK Take 1 packet by mouth 2 (two) times daily between meals. 04/29/20   Swayze, Ava, DO  polyethylene glycol (MIRALAX / GLYCOLAX) 17 g packet Take 17 g by mouth daily. Patient taking differently: Take 17 g by mouth daily as needed for mild constipation. 09/25/19   Arrien,  Jimmy Picket, MD  potassium chloride 20 MEQ TBCR Take 10 mEq by mouth 2 (two) times daily. 08/18/20   Mikhail, Velta Addison, DO  torsemide (DEMADEX) 10 MG tablet Take 5 tablets (50 mg total) by mouth 2 (two) times daily. 08/18/20   Cristal Ford, DO  witch hazel-glycerin (TUCKS) pad Apply topically as needed for itching, hemorrhoids or irritation. 08/18/20   Cristal Ford, DO    Physical Exam: Vitals:   09/24/20 2000 09/24/20 2015 09/24/20 2030 09/24/20 2112  BP: (!) 142/108 (!) 144/105 (!) 139/104 (!) 148/101  Pulse: (!) 117 (!) 116 (!) 117 (!) 115  Resp: 15 14 14 14   Temp:      TempSrc:      SpO2: 100% 100% 100% 100%   Constitutional: Obese woman resting in bed, NAD, calm, comfortable Eyes: PERRL, lids and conjunctivae normal ENMT: Mucous membranes are moist. Posterior pharynx clear of any exudate or lesions.Normal dentition.  Neck: normal, supple, no masses.  Respiratory: Faint bibasilar inspiratory crackles.  Normal respiratory effort. No accessory muscle use.  Cardiovascular: Irregularly irregular, no murmurs / rubs / gallops.  +2 bilateral lower extremity edema. Abdomen: no tenderness, no masses palpated. Bowel sounds positive.  Musculoskeletal: no clubbing / cyanosis. No joint deformity upper and lower extremities. Good ROM, no contractures. Normal muscle tone.  Skin: no rashes, lesions. No induration Neurologic: CN 2-12 grossly intact. Sensation intact. Strength 5/5 in all 4.  Psychiatric: Normal judgment and insight. Alert and oriented x 3. Normal mood.   Labs on Admission: I have personally reviewed following labs and imaging studies  CBC: Recent Labs  Lab 09/24/20 1447  WBC 3.1*  HGB 10.6*  HCT 33.7*  MCV 92.3  PLT 937*   Basic Metabolic Panel: Recent Labs  Lab 09/24/20 1447  NA 144  K 3.6  CL 105  CO2 27  GLUCOSE 134*  BUN 81*  CREATININE 4.31*  CALCIUM 7.9*   GFR: CrCl cannot be calculated (Unknown ideal weight.). Liver Function Tests: No results for  input(s): AST, ALT, ALKPHOS, BILITOT, PROT, ALBUMIN in the last 168 hours. No results for input(s): LIPASE, AMYLASE in the last 168 hours. No results for input(s): AMMONIA in the last 168 hours. Coagulation Profile: No results for input(s): INR, PROTIME in the last 168 hours. Cardiac Enzymes: No results for input(s): CKTOTAL, CKMB, CKMBINDEX, TROPONINI in the last 168 hours. BNP (last 3 results) No results for input(s): PROBNP in the last 8760 hours. HbA1C: No results for input(s): HGBA1C in the last 72 hours. CBG: No results for input(s): GLUCAP in the last 168 hours. Lipid Profile: No results for input(s): CHOL, HDL, LDLCALC, TRIG, CHOLHDL, LDLDIRECT in the last 72 hours. Thyroid Function Tests: No results for input(s): TSH, T4TOTAL, FREET4, T3FREE, THYROIDAB in the last 72 hours. Anemia Panel: No results for input(s): VITAMINB12, FOLATE, FERRITIN, TIBC, IRON, RETICCTPCT in the last 72 hours. Urine analysis:    Component Value Date/Time   COLORURINE YELLOW 08/12/2020 1155   APPEARANCEUR HAZY (A) 08/12/2020 1155   LABSPEC 1.013 08/12/2020 1155   PHURINE 5.0 08/12/2020 1155   GLUCOSEU NEGATIVE 08/12/2020 1155   HGBUR LARGE (A) 08/12/2020 1155   BILIRUBINUR NEGATIVE 08/12/2020 1155   KETONESUR NEGATIVE 08/12/2020 1155   PROTEINUR 100 (A) 08/12/2020 1155   UROBILINOGEN 0.2 03/06/2014 1644   NITRITE POSITIVE (A) 08/12/2020 1155   LEUKOCYTESUR TRACE (A) 08/12/2020 1155    Radiological Exams on Admission: DG Chest 2 View  Result Date: 09/24/2020 CLINICAL DATA:  Chest pain, shortness of breath and weakness. EXAM: CHEST - 2 VIEW COMPARISON:  August 07, 2020 FINDINGS: Decreased lung volumes are noted. Mild atelectasis and/or infiltrate is seen within the right lung base. There is a small right pleural effusion. The cardiac silhouette is markedly enlarged and unchanged in size. Degenerative changes seen throughout the thoracic spine and bilateral shoulders. IMPRESSION: 1. Stable  cardiomegaly with mild right basilar atelectasis and/or infiltrate. 2. Small right pleural effusion. Electronically Signed   By: Virgina Norfolk M.D.   On: 09/24/2020 15:23    EKG: Personally reviewed. Atrial fibrillation, low voltage, rate improved when compared to prior.  Assessment/Plan Principal Problem:   Acute on chronic combined systolic (congestive) and diastolic (congestive) heart failure (HCC) Active Problems:   HTN (hypertension)   Thrombocytopenia (HCC)   Acute kidney injury superimposed on CKD ([place])   Vanessa [name] is a 65 y.o. female with medical history significant for chronic combined systolic and diastolic CHF (EF 16-96% by TTE  08/08/2020), atrial fibrillation (not on anticoagulation due to chronic bleeding from endometrial cancer), CKD stage IV, T2DM, HTN, anemia of chronic disease, hx of MSSA bacteremia, stage I endometrial cancer s/p palliative radiation who is admitted with acute on chronic combined systolic and diastolic CHF.  Acute on chronic combined systolic and diastolic CHF: Last EF 29-24% by TTE 08/08/2020.  Presenting with progressive peripheral edema, orthopnea, DOE. -Continue IV Lasix 40 mg twice daily -Monitor strict I/O's and daily weights -Supplement potassium -Continue Coreg, not on ACE/ARB/spironolactone due to renal dysfunction  AKI on CKD stage IV: Suspect secondary to acute on chronic CHF.  Continue diuresis as above and monitor renal function closely.  Paroxysmal atrial fibrillation: Remains in atrial fibrillation with controlled rate.  Not on anticoagulation due to high risk of bleeding. -Continue Coreg 25 mg twice daily  Hypertension: Continue Coreg, hydralazine, Imdur and IV Lasix as above.  Thrombocytopenia: Mild without any obvious active bleeding.  Continue to monitor.  Endometrial cancer with chronic abnormal uterine bleeding Anemia due to chronic blood loss and CKD: Chronic and appears stable without any obvious active  bleeding.  DVT prophylaxis: SCDs Code Status: DNR, confirmed with patient Family Communication: Discussed with patient, she has discussed with family Disposition Plan: From [facility] SNF and likely discharge to SNF pending adequate diuresis and renal function Consults called: None Level of care: Telemetry Cardiac Admission status:  Status is: Observation  The patient remains OBS appropriate and will d/c before 2 midnights.  Dispo: The patient is from: SNF              Anticipated d/c is to: SNF              Patient currently is not medically stable to d/c.    Zada Finders MD [facility]  If 7PM-7AM, please contact night-coverage [email or web address]  09/24/2020, 10:23 PM

## 2020-09-25 DIAGNOSIS — I10 Essential (primary) hypertension: Secondary | ICD-10-CM | POA: Diagnosis not present

## 2020-09-25 DIAGNOSIS — I480 Paroxysmal atrial fibrillation: Secondary | ICD-10-CM | POA: Diagnosis present

## 2020-09-25 DIAGNOSIS — Z8614 Personal history of Methicillin resistant Staphylococcus aureus infection: Secondary | ICD-10-CM | POA: Diagnosis not present

## 2020-09-25 DIAGNOSIS — C541 Malignant neoplasm of endometrium: Secondary | ICD-10-CM | POA: Diagnosis not present

## 2020-09-25 DIAGNOSIS — D696 Thrombocytopenia, unspecified: Secondary | ICD-10-CM | POA: Diagnosis not present

## 2020-09-25 DIAGNOSIS — E785 Hyperlipidemia, unspecified: Secondary | ICD-10-CM | POA: Diagnosis present

## 2020-09-25 DIAGNOSIS — N184 Chronic kidney disease, stage 4 (severe): Secondary | ICD-10-CM | POA: Diagnosis present

## 2020-09-25 DIAGNOSIS — Z7189 Other specified counseling: Secondary | ICD-10-CM | POA: Diagnosis not present

## 2020-09-25 DIAGNOSIS — N189 Chronic kidney disease, unspecified: Secondary | ICD-10-CM

## 2020-09-25 DIAGNOSIS — I50811 Acute right heart failure: Secondary | ICD-10-CM

## 2020-09-25 DIAGNOSIS — I471 Supraventricular tachycardia: Secondary | ICD-10-CM | POA: Diagnosis not present

## 2020-09-25 DIAGNOSIS — Z20822 Contact with and (suspected) exposure to covid-19: Secondary | ICD-10-CM | POA: Diagnosis present

## 2020-09-25 DIAGNOSIS — Z1612 Extended spectrum beta lactamase (ESBL) resistance: Secondary | ICD-10-CM | POA: Diagnosis not present

## 2020-09-25 DIAGNOSIS — Z888 Allergy status to other drugs, medicaments and biological substances status: Secondary | ICD-10-CM | POA: Diagnosis not present

## 2020-09-25 DIAGNOSIS — Z87891 Personal history of nicotine dependence: Secondary | ICD-10-CM | POA: Diagnosis not present

## 2020-09-25 DIAGNOSIS — Z6841 Body Mass Index (BMI) 40.0 and over, adult: Secondary | ICD-10-CM | POA: Diagnosis not present

## 2020-09-25 DIAGNOSIS — Z515 Encounter for palliative care: Secondary | ICD-10-CM | POA: Diagnosis not present

## 2020-09-25 DIAGNOSIS — I5082 Biventricular heart failure: Secondary | ICD-10-CM | POA: Diagnosis present

## 2020-09-25 DIAGNOSIS — Z1619 Resistance to other specified beta lactam antibiotics: Secondary | ICD-10-CM | POA: Diagnosis not present

## 2020-09-25 DIAGNOSIS — N390 Urinary tract infection, site not specified: Secondary | ICD-10-CM | POA: Diagnosis not present

## 2020-09-25 DIAGNOSIS — I4892 Unspecified atrial flutter: Secondary | ICD-10-CM | POA: Diagnosis present

## 2020-09-25 DIAGNOSIS — I5043 Acute on chronic combined systolic (congestive) and diastolic (congestive) heart failure: Secondary | ICD-10-CM | POA: Diagnosis present

## 2020-09-25 DIAGNOSIS — I130 Hypertensive heart and chronic kidney disease with heart failure and stage 1 through stage 4 chronic kidney disease, or unspecified chronic kidney disease: Secondary | ICD-10-CM | POA: Diagnosis present

## 2020-09-25 DIAGNOSIS — R188 Other ascites: Secondary | ICD-10-CM | POA: Diagnosis present

## 2020-09-25 DIAGNOSIS — B9620 Unspecified Escherichia coli [E. coli] as the cause of diseases classified elsewhere: Secondary | ICD-10-CM | POA: Diagnosis not present

## 2020-09-25 DIAGNOSIS — N179 Acute kidney failure, unspecified: Secondary | ICD-10-CM | POA: Diagnosis present

## 2020-09-25 DIAGNOSIS — Z66 Do not resuscitate: Secondary | ICD-10-CM | POA: Diagnosis present

## 2020-09-25 DIAGNOSIS — D61818 Other pancytopenia: Secondary | ICD-10-CM | POA: Diagnosis present

## 2020-09-25 DIAGNOSIS — E876 Hypokalemia: Secondary | ICD-10-CM | POA: Diagnosis present

## 2020-09-25 LAB — BASIC METABOLIC PANEL
Anion gap: 13 (ref 5–15)
BUN: 79 mg/dL — ABNORMAL HIGH (ref 8–23)
CO2: 24 mmol/L (ref 22–32)
Calcium: 7.8 mg/dL — ABNORMAL LOW (ref 8.9–10.3)
Chloride: 105 mmol/L (ref 98–111)
Creatinine, Ser: 4.08 mg/dL — ABNORMAL HIGH (ref 0.44–1.00)
GFR, Estimated: 11 mL/min — ABNORMAL LOW (ref >60)
Glucose, Bld: 167 mg/dL — ABNORMAL HIGH (ref 70–99)
Potassium: 3.7 mmol/L (ref 3.5–5.1)
Sodium: 142 mmol/L (ref 135–145)

## 2020-09-25 LAB — CBC
HCT: 32.9 % — ABNORMAL LOW (ref 36.0–46.0)
Hemoglobin: 10.6 g/dL — ABNORMAL LOW (ref 12.0–15.0)
MCH: 29.1 pg (ref 26.0–34.0)
MCHC: 32.2 g/dL (ref 30.0–36.0)
MCV: 90.4 fL (ref 80.0–100.0)
Platelets: 93 10*3/uL — ABNORMAL LOW (ref 150–400)
RBC: 3.64 MIL/uL — ABNORMAL LOW (ref 3.87–5.11)
RDW: 17.2 % — ABNORMAL HIGH (ref 11.5–15.5)
WBC: 3.1 10*3/uL — ABNORMAL LOW (ref 4.0–10.5)
nRBC: 1.9 % — ABNORMAL HIGH (ref 0.0–0.2)

## 2020-09-25 LAB — MAGNESIUM: Magnesium: 2.1 mg/dL (ref 1.7–2.4)

## 2020-09-25 MED ORDER — HYDROCORTISONE 1 % EX CREA
TOPICAL_CREAM | Freq: Two times a day (BID) | CUTANEOUS | Status: DC
  Administered 2020-10-02 – 2020-10-14 (×3): 1 via TOPICAL
  Filled 2020-09-25 (×5): qty 28

## 2020-09-25 MED ORDER — METOPROLOL TARTRATE 5 MG/5ML IV SOLN
2.5000 mg | Freq: Once | INTRAVENOUS | Status: AC
  Administered 2020-09-25: 2.5 mg via INTRAVENOUS
  Filled 2020-09-25: qty 5

## 2020-09-25 MED ORDER — PHENYLEPHRINE IN HARD FAT 0.25 % RE SUPP
1.0000 | Freq: Two times a day (BID) | RECTAL | Status: DC | PRN
  Administered 2020-09-25 – 2020-10-01 (×6): 1 via RECTAL
  Filled 2020-09-25 (×8): qty 1

## 2020-09-25 MED ORDER — PHENYLEPHRINE-MINERAL OIL-PET 0.25-14-74.9 % RE OINT
1.0000 "application " | TOPICAL_OINTMENT | Freq: Two times a day (BID) | RECTAL | Status: DC | PRN
  Filled 2020-09-25: qty 57

## 2020-09-25 NOTE — Plan of Care (Signed)
  Problem: Education: °Goal: Ability to demonstrate management of disease process will improve °Outcome: Progressing °Goal: Ability to verbalize understanding of medication therapies will improve °Outcome: Progressing °  °Problem: Cardiac: °Goal: Ability to achieve and maintain adequate cardiopulmonary perfusion will improve °Outcome: Progressing °  °Problem: Education: °Goal: Knowledge of General Education information will improve °Description: Including pain rating scale, medication(s)/side effects and non-pharmacologic comfort measures °Outcome: Progressing °  °

## 2020-09-25 NOTE — Progress Notes (Signed)
Heart Failure Navigator Progress Note  Assessed for Heart & Vascular TOC clinic readiness.  Unfortunately at this time the patient does not meet criteria due to renal function out of therapeutic range for medication optimization.  Navigator available for reassessment of patient.   Pricilla Holm, RN, BSN Heart Failure Nurse Navigator [phone]

## 2020-09-25 NOTE — Progress Notes (Signed)
Patient refusing to get Vanessa [name] which accurately measures her urine output. Educated patient about the importance of measuring her urine and how it relates to heart failure. She has had multiple urine occurrence here on [place] and at the ER. Will continue to reassess.

## 2020-09-25 NOTE — Progress Notes (Signed)
   09/25/20 0047  Assess: MEWS Score  Temp 97.8 F (36.6 C)  BP (!) 135/111  Pulse Rate (!) 114  ECG Heart Rate (!) 114  Resp 17  SpO2 100 %  O2 Device Room Air  Assess: MEWS Score  MEWS Temp 0  MEWS Systolic 0  MEWS Pulse 2  MEWS RR 0  MEWS LOC 0  MEWS Score 2  MEWS Score Color Yellow  Assess: if the MEWS score is Yellow or Red  Were vital signs taken at a resting state? Yes  Focused Assessment No change from prior assessment  Early Detection of Sepsis Score *See Row Information* Low  MEWS guidelines implemented *See Row Information* Yes  Take Vital Signs  Increase Vital Sign Frequency  Yellow: Q 2hr X 2 then Q 4hr X 2, if remains yellow, continue Q 4hrs  Escalate  MEWS: Escalate Yellow: discuss with charge nurse/RN and consider discussing with provider and RRT  Notify: Charge Nurse/RN  Name of Charge Nurse/RN Notified Presenter, broadcasting  Date Charge Nurse/RN Notified 09/25/20  Time Charge Nurse/RN Notified 0100  Document  Patient Outcome Stabilized after interventions (gave BP medication)  Progress note created (see row info) Yes

## 2020-09-25 NOTE — Progress Notes (Signed)
[facility]                                                                              Patient Demographics  Vanessa [name], is a 65 y.o. female, DOB - [birth year], IOE:[id number]  Admit date - 09/24/2020   Admitting Physician Lenore Cordia, MD  Outpatient Primary MD for the patient is Patient, No Pcp Per  Outpatient specialists:   LOS - 0  days   Medical records reviewed and are as summarized below:    Chief Complaint  Patient presents with  . Chest Pain       Brief summary   Patient is a 65 year old female with chronic combined systolic and diastolic CHF (EF 18-29% by TTE 08/08/2020), atrial fibrillation (not on anticoagulation due to chronic bleeding from endometrial cancer), CKD stage IV, T2DM, HTN, anemia of chronic disease, hx of MSSA bacteremia, stage I endometrial cancer s/p palliative radiation presented to ED from SNF with shortness of breath and peripheral edema.  Patient recently admitted from 08/07/2020-08/18/2020 for acute on chronic combined systolic and diastolic CHF exacerbation, AKI on CKD stage IV, and new onset A. fib with RVR.  Patient was managed with IV diuresis before transition to oral torsemide. A. fib/flutter was controlled with Coreg.  Anticoagulation not started due to high risk of bleeding.  Patient was ultimately discharged to SNF with palliative care.  Patient reports worsening shortness of breath over the last few days.  She has been experiencing orthopnea and dyspnea on exertion.  She has seen increased swelling in both of her legs.  She reports intermittent chest pain but none currently.  She reports good urine output without dysuria.  She has not had any increased abdominal swelling compared to her baseline.  She denies any obvious bleeding  Patient was admitted for acute on chronic CHF exacerbation, given Lasix 60 mg IV x1 in ED.  BNP 1724, chest x-ray with cardiomegaly, mild atelectasis of right lung base, small right pleural  effusion Assessment & Plan    Principal Problem:   Acute on chronic combined systolic (congestive) and diastolic (congestive) heart failure ([place]) -2D echo 07/2020 had shown EF of 35 to 40%, presented with progressive peripheral edema, orthopnea, DOE, BNP 1724 -Continue IV Lasix 40 mg twice daily, received Lasix 60 mg IV x1 in ED -Continue strict I's and O's and daily weights -Continue Coreg, not on ACE/ARB/spironolactone due to renal dysfunction   Active problems Acute kidney injury superimposed on CKD stage IV -Likely worsened due to acute on chronic CHF, baseline creatinine 3.6-3.7 -Presented with creatinine of 4.3, continue diuresis and monitor renal function -Creatinine improving, 4.0 today  Paroxysmal atrial fibrillation -continue Coreg 25 mg twice daily, HR slightly elevated in low 100's, in atrial fibrillation -Not on anticoagulation due to high risk of bleeding  Essential hypertension -BP stable, continue Coreg, hydralazine, Imdur -Continue IV Lasix  Thrombocytopenia -Unclear etiology, mild no active bleeding, monitor counts  Thrombocytopenia: Mild without any obvious active bleeding.  Continue to monitor.   History of endometrial cancer with chronic abnormal uterine bleeding Anemia due to chronic blood loss and CKD stage IV -Baseline ~9 Currently stable,  10.6   Obesity Estimated body mass index is 45.51 kg/m as calculated from the following:   Height as of this encounter: 4\' 11"  (1.499 m).   Weight as of this encounter: 102.2 kg.  Code Status: DNR DVT Prophylaxis:  SCDs Start: 09/24/20 2239   Level of Care: Level of care: Telemetry Cardiac Family Communication: Discussed all imaging results, lab results, explained to the patient   Disposition Plan:     Status is: Observation  The patient will require care spanning > 2 midnights and should be moved to inpatient because: Inpatient level of care appropriate due to severity of illness  Dispo: The  patient is from: SNF              Anticipated d/c is to: SNF              Patient currently is not medically stable to d/c.  On IV Lasix for diuresis   Difficult to place patient No      Time Spent in minutes   25 minutes  Procedures:  None Consultants:   None  Antimicrobials:   Anti-infectives (From admission, onward)   None          Medications  Scheduled Meds: . carvedilol  25 mg Oral BID WC  . ferrous sulfate  325 mg Oral BID WC  . furosemide  40 mg Intravenous Q12H  . hydrALAZINE  50 mg Oral Q8H  . hydrocortisone cream   Topical BID  . isosorbide mononitrate  30 mg Oral Daily  . nutrition supplement (JUVEN)  1 packet Oral BID BM  . potassium chloride  10 mEq Oral BID  . sodium chloride flush  3 mL Intravenous Q12H   Continuous Infusions: PRN Meds:.acetaminophen **OR** acetaminophen, hydrOXYzine, ondansetron **OR** ondansetron (ZOFRAN) IV, polyethylene glycol      Subjective:   Vanessa [name] was seen and examined today.  States shortness of breath is improving, heart rate*in low 100s, atrial fibrillation, no chest pain or dizziness.  Patient denies abdominal pain, N/V/D/C, new weakness, numbess, tingling. No acute events overnight.    Objective:   Vitals:   09/25/20 0056 09/25/20 0317 09/25/20 0451 09/25/20 0759  BP:  (!) 137/97 (!) 137/97 (!) 136/103  Pulse:  (!) 113 100 (!) 113  Resp:  15 18   Temp:  97.8 F (36.6 C) 97.9 F (36.6 C)   TempSrc:  Oral Oral   SpO2:  100% 93% 100%  Weight: 102.2 kg     Height: 4\' 11"  (1.499 m)       Intake/Output Summary (Last 24 hours) at 09/25/2020 1015 Last data filed at 09/25/2020 0100 Gross per 24 hour  Intake 360 ml  Output --  Net 360 ml     Wt Readings from Last 3 Encounters:  09/25/20 102.2 kg  08/17/20 98 kg  06/10/20 117.9 kg     Exam  General: Alert and oriented x 3, NAD  Cardiovascular: S1 S2 auscultated, irregularly irregular, tachycardia  Respiratory: Bibasilar Rales, no  wheezing  Gastrointestinal: Soft, nontender, nondistended, + bowel sounds  Ext: 2+ pedal edema bilaterally  Neuro: no new deficits  Musculoskeletal: No digital cyanosis, clubbing  Skin: No rashes  Psych: Normal affect and demeanor, alert and oriented x3    Data Reviewed:  I have personally reviewed following labs and imaging studies  Micro Results Recent Results (from the past 240 hour(s))  Resp Panel by RT-PCR (Flu A&B, Covid) Nasopharyngeal Swab     Status: None  Collection Time: 09/24/20  7:31 PM   Specimen: Nasopharyngeal Swab; Nasopharyngeal(NP) swabs in vial transport medium  Result Value Ref Range Status   SARS Coronavirus 2 by RT PCR NEGATIVE NEGATIVE Final    Comment: (NOTE) SARS-CoV-2 target nucleic acids are NOT DETECTED.  The SARS-CoV-2 RNA is generally detectable in upper respiratory specimens during the acute phase of infection. The lowest concentration of SARS-CoV-2 viral copies this assay can detect is 138 copies/mL. A negative result does not preclude SARS-Cov-2 infection and should not be used as the sole basis for treatment or other patient management decisions. A negative result may occur with  improper specimen collection/handling, submission of specimen other than nasopharyngeal swab, presence of viral mutation(s) within the areas targeted by this assay, and inadequate number of viral copies(<138 copies/mL). A negative result must be combined with clinical observations, patient history, and epidemiological information. The expected result is Negative.  Fact Sheet for Patients:  EntrepreneurPulse.com.au  Fact Sheet for Healthcare Providers:  IncredibleEmployment.be  This test is no t yet approved or cleared by the [place] FDA and  has been authorized for detection and/or diagnosis of SARS-CoV-2 by FDA under an Emergency Use Authorization (EUA). This EUA will remain  in effect (meaning this test can be  used) for the duration of the COVID-19 declaration under Section 564(b)(1) of the Act, 21 U.S.C.section 360bbb-3(b)(1), unless the authorization is terminated  or revoked sooner.       Influenza A by PCR NEGATIVE NEGATIVE Final   Influenza B by PCR NEGATIVE NEGATIVE Final    Comment: (NOTE) The Xpert Xpress SARS-CoV-2/FLU/RSV plus assay is intended as an aid in the diagnosis of influenza from Nasopharyngeal swab specimens and should not be used as a sole basis for treatment. Nasal washings and aspirates are unacceptable for Xpert Xpress SARS-CoV-2/FLU/RSV testing.  Fact Sheet for Patients: EntrepreneurPulse.com.au  Fact Sheet for Healthcare Providers: IncredibleEmployment.be  This test is not yet approved or cleared by the [place] FDA and has been authorized for detection and/or diagnosis of SARS-CoV-2 by FDA under an Emergency Use Authorization (EUA). This EUA will remain in effect (meaning this test can be used) for the duration of the COVID-19 declaration under Section 564(b)(1) of the Act, 21 U.S.C. section 360bbb-3(b)(1), unless the authorization is terminated or revoked.  Performed at [facility], [place] [address] [place],  [address]     Radiology Reports DG Chest 2 View  Result Date: 09/24/2020 CLINICAL DATA:  Chest pain, shortness of breath and weakness. EXAM: CHEST - 2 VIEW COMPARISON:  August 07, 2020 FINDINGS: Decreased lung volumes are noted. Mild atelectasis and/or infiltrate is seen within the right lung base. There is a small right pleural effusion. The cardiac silhouette is markedly enlarged and unchanged in size. Degenerative changes seen throughout the thoracic spine and bilateral shoulders. IMPRESSION: 1. Stable cardiomegaly with mild right basilar atelectasis and/or infiltrate. 2. Small right pleural effusion. Electronically Signed   By: Virgina Norfolk M.D.   On: 09/24/2020 15:23    Lab  Data:  CBC: Recent Labs  Lab 09/24/20 1447 09/25/20 0331  WBC 3.1* 3.1*  HGB 10.6* 10.6*  HCT 33.7* 32.9*  MCV 92.3 90.4  PLT 102* 93*   Basic Metabolic Panel: Recent Labs  Lab 09/24/20 1447 09/25/20 0331  NA 144 142  K 3.6 3.7  CL 105 105  CO2 27 24  GLUCOSE 134* 167*  BUN 81* 79*  CREATININE 4.31* 4.08*  CALCIUM 7.9* 7.8*  MG  --  2.1   GFR: Estimated Creatinine Clearance: 14.3 mL/min (A) (by C-G formula based on SCr of 4.08 mg/dL (H)). Liver Function Tests: No results for input(s): AST, ALT, ALKPHOS, BILITOT, PROT, ALBUMIN in the last 168 hours. No results for input(s): LIPASE, AMYLASE in the last 168 hours. No results for input(s): AMMONIA in the last 168 hours. Coagulation Profile: No results for input(s): INR, PROTIME in the last 168 hours. Cardiac Enzymes: No results for input(s): CKTOTAL, CKMB, CKMBINDEX, TROPONINI in the last 168 hours. BNP (last 3 results) No results for input(s): PROBNP in the last 8760 hours. HbA1C: No results for input(s): HGBA1C in the last 72 hours. CBG: No results for input(s): GLUCAP in the last 168 hours. Lipid Profile: No results for input(s): CHOL, HDL, LDLCALC, TRIG, CHOLHDL, LDLDIRECT in the last 72 hours. Thyroid Function Tests: No results for input(s): TSH, T4TOTAL, FREET4, T3FREE, THYROIDAB in the last 72 hours. Anemia Panel: No results for input(s): VITAMINB12, FOLATE, FERRITIN, TIBC, IRON, RETICCTPCT in the last 72 hours. Urine analysis:    Component Value Date/Time   COLORURINE YELLOW 08/12/2020 1155   APPEARANCEUR HAZY (A) 08/12/2020 1155   LABSPEC 1.013 08/12/2020 1155   PHURINE 5.0 08/12/2020 1155   GLUCOSEU NEGATIVE 08/12/2020 1155   HGBUR LARGE (A) 08/12/2020 1155   BILIRUBINUR NEGATIVE 08/12/2020 1155   KETONESUR NEGATIVE 08/12/2020 1155   PROTEINUR 100 (A) 08/12/2020 1155   UROBILINOGEN 0.2 03/06/2014 1644   NITRITE POSITIVE (A) 08/12/2020 1155   LEUKOCYTESUR TRACE (A) 08/12/2020 1155     Vanessa [name] M.D. [facility] 09/25/2020, 10:15 AM  Available via Epic secure chat 7am-7pm After 7 pm, please refer to night coverage provider listed on amion.

## 2020-09-26 ENCOUNTER — Other Ambulatory Visit: Payer: Self-pay

## 2020-09-26 DIAGNOSIS — I5043 Acute on chronic combined systolic (congestive) and diastolic (congestive) heart failure: Secondary | ICD-10-CM | POA: Diagnosis not present

## 2020-09-26 LAB — BASIC METABOLIC PANEL
Anion gap: 13 (ref 5–15)
BUN: 83 mg/dL — ABNORMAL HIGH (ref 8–23)
CO2: 23 mmol/L (ref 22–32)
Calcium: 7.8 mg/dL — ABNORMAL LOW (ref 8.9–10.3)
Chloride: 107 mmol/L (ref 98–111)
Creatinine, Ser: 3.88 mg/dL — ABNORMAL HIGH (ref 0.44–1.00)
GFR, Estimated: 12 mL/min — ABNORMAL LOW (ref >60)
Glucose, Bld: 98 mg/dL (ref 70–99)
Potassium: 3.5 mmol/L (ref 3.5–5.1)
Sodium: 143 mmol/L (ref 135–145)

## 2020-09-26 LAB — CBC
HCT: 34.1 % — ABNORMAL LOW (ref 36.0–46.0)
Hemoglobin: 10.7 g/dL — ABNORMAL LOW (ref 12.0–15.0)
MCH: 28.5 pg (ref 26.0–34.0)
MCHC: 31.4 g/dL (ref 30.0–36.0)
MCV: 90.9 fL (ref 80.0–100.0)
Platelets: 94 10*3/uL — ABNORMAL LOW (ref 150–400)
RBC: 3.75 MIL/uL — ABNORMAL LOW (ref 3.87–5.11)
RDW: 17.3 % — ABNORMAL HIGH (ref 11.5–15.5)
WBC: 3.3 10*3/uL — ABNORMAL LOW (ref 4.0–10.5)
nRBC: 1.5 % — ABNORMAL HIGH (ref 0.0–0.2)

## 2020-09-26 LAB — MRSA PCR SCREENING: MRSA by PCR: POSITIVE — AB

## 2020-09-26 MED ORDER — MUPIROCIN 2 % EX OINT
1.0000 "application " | TOPICAL_OINTMENT | Freq: Two times a day (BID) | CUTANEOUS | Status: AC
  Administered 2020-09-26 – 2020-09-30 (×10): 1 via NASAL
  Filled 2020-09-26 (×2): qty 22

## 2020-09-26 MED ORDER — CHLORHEXIDINE GLUCONATE CLOTH 2 % EX PADS
6.0000 | MEDICATED_PAD | Freq: Every day | CUTANEOUS | Status: AC
  Administered 2020-09-27 – 2020-10-01 (×5): 6 via TOPICAL

## 2020-09-26 NOTE — Plan of Care (Signed)

## 2020-09-27 DIAGNOSIS — I5043 Acute on chronic combined systolic (congestive) and diastolic (congestive) heart failure: Secondary | ICD-10-CM | POA: Diagnosis not present

## 2020-09-27 LAB — ALBUMIN: Albumin: 2.8 g/dL — ABNORMAL LOW (ref 3.5–5.0)

## 2020-09-27 LAB — BASIC METABOLIC PANEL
Anion gap: 16 — ABNORMAL HIGH (ref 5–15)
BUN: 85 mg/dL — ABNORMAL HIGH (ref 8–23)
CO2: 23 mmol/L (ref 22–32)
Calcium: 7.8 mg/dL — ABNORMAL LOW (ref 8.9–10.3)
Chloride: 104 mmol/L (ref 98–111)
Creatinine, Ser: 3.71 mg/dL — ABNORMAL HIGH (ref 0.44–1.00)
GFR, Estimated: 13 mL/min — ABNORMAL LOW (ref >60)
Glucose, Bld: 109 mg/dL — ABNORMAL HIGH (ref 70–99)
Potassium: 3.5 mmol/L (ref 3.5–5.1)
Sodium: 143 mmol/L (ref 135–145)

## 2020-09-27 LAB — CBC
HCT: 32.5 % — ABNORMAL LOW (ref 36.0–46.0)
Hemoglobin: 10.4 g/dL — ABNORMAL LOW (ref 12.0–15.0)
MCH: 28.9 pg (ref 26.0–34.0)
MCHC: 32 g/dL (ref 30.0–36.0)
MCV: 90.3 fL (ref 80.0–100.0)
Platelets: 92 10*3/uL — ABNORMAL LOW (ref 150–400)
RBC: 3.6 MIL/uL — ABNORMAL LOW (ref 3.87–5.11)
RDW: 17.3 % — ABNORMAL HIGH (ref 11.5–15.5)
WBC: 2.8 10*3/uL — ABNORMAL LOW (ref 4.0–10.5)
nRBC: 2.5 % — ABNORMAL HIGH (ref 0.0–0.2)

## 2020-09-27 MED ORDER — FUROSEMIDE 10 MG/ML IJ SOLN: 80.0000 mg | Freq: Two times a day (BID) | INTRAMUSCULAR | Status: DC

## 2020-09-27 MED ORDER — FUROSEMIDE 10 MG/ML IJ SOLN
40.0000 mg | Freq: Once | INTRAMUSCULAR | Status: AC
  Administered 2020-09-27: 40 mg via INTRAVENOUS
  Filled 2020-09-27: qty 4

## 2020-09-27 MED ORDER — FUROSEMIDE 10 MG/ML IJ SOLN
80.0000 mg | Freq: Two times a day (BID) | INTRAMUSCULAR | Status: DC
  Administered 2020-09-28 – 2020-10-02 (×10): 80 mg via INTRAVENOUS
  Filled 2020-09-27 (×11): qty 8

## 2020-09-27 NOTE — Progress Notes (Signed)
[facility]                                                                              Patient Demographics  Vanessa [name], is a 65 y.o. female, DOB - 1955-11-26, VQM:[id number]  Admit date - 09/24/2020   Admitting Physician Lenore Cordia, MD  Outpatient Primary MD for the patient is Patient, No Pcp Per  Outpatient specialists:   LOS - 2  days   Medical records reviewed and are as summarized below:    Chief Complaint  Patient presents with  . Chest Pain       Brief summary   Patient is a 65 year old female with chronic combined systolic and diastolic CHF (EF 93-26% by TTE 08/08/2020), atrial fibrillation (not on anticoagulation due to chronic bleeding from endometrial cancer), CKD stage IV, T2DM, HTN, anemia of chronic disease, hx of MSSA bacteremia, stage I endometrial cancer s/p palliative radiation presented to ED from SNF with shortness of breath and peripheral edema.  Patient recently admitted from 08/07/2020-08/18/2020 for acute on chronic combined systolic and diastolic CHF exacerbation, AKI on CKD stage IV, and new onset A. fib with RVR.  Patient was managed with IV diuresis before transition to oral torsemide. A. fib/flutter was controlled with Coreg.  Anticoagulation not started due to high risk of bleeding.  Patient was ultimately discharged to SNF with palliative care.  Patient reports worsening shortness of breath over the last few days.  She has been experiencing orthopnea and dyspnea on exertion.  She has seen increased swelling in both of her legs.  She reports intermittent chest pain but none currently.  She reports good urine output without dysuria.  She has not had any increased abdominal swelling compared to her baseline.  She denies any obvious bleeding  Patient was admitted for acute on chronic CHF exacerbation, given Lasix 60 mg IV x1 in ED.  BNP 1724, chest x-ray with cardiomegaly, mild atelectasis of right lung base, small right pleural  effusion  09/26/2020: Patient seen.  Available records reviewed.  Severe renal disease is noted.  According to the patient, she is known to the nephrology team. Assessment & Plan   Principal Problem:   Acute on chronic combined systolic (congestive) and diastolic (congestive) heart failure ([place]) -2D echo 07/2020 had shown EF of 35 to 40%, presented with progressive peripheral edema, orthopnea, DOE, BNP 1724 -Continue IV Lasix 40 mg twice daily, received Lasix 60 mg IV x1 in ED -Continue strict I's and O's and daily weights -Continue Coreg, not on ACE/ARB/spironolactone due to renal dysfunction 09/26/2020: CHF symptoms seem to be improving.   Active problems Acute kidney injury superimposed on CKD stage IV -Likely worsened due to acute on chronic CHF, baseline creatinine 3.6-3.7 -Presented with creatinine of 4.3, continue diuresis and monitor renal function -Creatinine improving, 4.0 today 09/26/2020: Patient is known to the nephrology team.  Have low threshold to consult the nephrology team.  Paroxysmal atrial fibrillation -continue Coreg 25 mg twice daily, HR slightly elevated in low 100's, in atrial fibrillation -Not on anticoagulation due to high risk of bleeding  Essential hypertension -BP stable, continue Coreg, hydralazine, Imdur -Continue  IV Lasix  Thrombocytopenia -Unclear etiology, mild no active bleeding, monitor counts  Thrombocytopenia: Mild without any obvious active bleeding.  Continue to monitor.   History of endometrial cancer with chronic abnormal uterine bleeding Anemia due to chronic blood loss and CKD stage IV -Baseline ~9 Currently stable, 10.6   Obesity Estimated body mass index is 44.09 kg/m as calculated from the following:   Height as of this encounter: 4\' 11"  (1.499 m).   Weight as of this encounter: 99 kg.  Code Status: DNR DVT Prophylaxis:  SCDs Start: 09/24/20 2239   Level of Care: Level of care: Telemetry Cardiac Family Communication:  Discussed all imaging results, lab results, explained to the patient   Disposition Plan:     Status is: Observation  The patient will require care spanning > 2 midnights and should be moved to inpatient because: Inpatient level of care appropriate due to severity of illness  Dispo: The patient is from: SNF              Anticipated d/c is to: SNF              Patient currently is not medically stable to d/c.  On IV Lasix for diuresis   Difficult to place patient No      Time Spent in minutes   35 minutes  Procedures:  None Consultants:   None  Antimicrobials:   Anti-infectives (From admission, onward)   None         Medications  Scheduled Meds: . carvedilol  25 mg Oral BID WC  . Chlorhexidine Gluconate Cloth  6 each Topical Q0600  . ferrous sulfate  325 mg Oral BID WC  . furosemide  40 mg Intravenous Q12H  . hydrALAZINE  50 mg Oral Q8H  . hydrocortisone cream   Topical BID  . isosorbide mononitrate  30 mg Oral Daily  . mupirocin ointment  1 application Nasal BID  . nutrition supplement (JUVEN)  1 packet Oral BID BM  . potassium chloride  10 mEq Oral BID  . sodium chloride flush  3 mL Intravenous Q12H   Continuous Infusions: PRN Meds:.acetaminophen **OR** acetaminophen, hydrOXYzine, ondansetron **OR** ondansetron (ZOFRAN) IV, phenylephrine, polyethylene glycol      Subjective:  Shortness of breath and dyspnea on exertion have improved.  Objective:   Vitals:   09/26/20 1500 09/26/20 2044 09/27/20 0031 09/27/20 0422  BP: (!) 148/108 (!) 122/102 (!) 117/104 (!) 136/98  Pulse: (!) 111 (!) 111 (!) 112 (!) 108  Resp: 18 17 18 18   Temp:  97.7 F (36.5 C) 97.9 F (36.6 C) 97.8 F (36.6 C)  TempSrc:  Oral Oral Oral  SpO2: 100% 100% 95% 100%  Weight:      Height:        Intake/Output Summary (Last 24 hours) at 09/27/2020 0518 Last data filed at 09/27/2020 0427 Gross per 24 hour  Intake 380 ml  Output 800 ml  Net -420 ml     Wt Readings from Last 3  Encounters:  09/26/20 99 kg  08/17/20 98 kg  06/10/20 117.9 kg     Exam  General: Patient is obese.  Patient is not in any distress.  Patient is awake and alert.    Cardiovascular: S1 S2, irregularly irregular.  Respiratory: Decreased air entry.  Gastrointestinal: Obese, soft and nontender.   Ext: Bilateral lower extremity edema.  Neuro: Patient is awake and alert.  Patient moves all extremities.  Data Reviewed:  I have personally reviewed following labs  and imaging studies  Micro Results Recent Results (from the past 240 hour(s))  Resp Panel by RT-PCR (Flu A&B, Covid) Nasopharyngeal Swab     Status: None   Collection Time: 09/24/20  7:31 PM   Specimen: Nasopharyngeal Swab; Nasopharyngeal(NP) swabs in vial transport medium  Result Value Ref Range Status   SARS Coronavirus 2 by RT PCR NEGATIVE NEGATIVE Final    Comment: (NOTE) SARS-CoV-2 target nucleic acids are NOT DETECTED.  The SARS-CoV-2 RNA is generally detectable in upper respiratory specimens during the acute phase of infection. The lowest concentration of SARS-CoV-2 viral copies this assay can detect is 138 copies/mL. A negative result does not preclude SARS-Cov-2 infection and should not be used as the sole basis for treatment or other patient management decisions. A negative result may occur with  improper specimen collection/handling, submission of specimen other than nasopharyngeal swab, presence of viral mutation(s) within the areas targeted by this assay, and inadequate number of viral copies(<138 copies/mL). A negative result must be combined with clinical observations, patient history, and epidemiological information. The expected result is Negative.  Fact Sheet for Patients:  EntrepreneurPulse.com.au  Fact Sheet for Healthcare Providers:  IncredibleEmployment.be  This test is no t yet approved or cleared by the [place] FDA and  has been authorized for  detection and/or diagnosis of SARS-CoV-2 by FDA under an Emergency Use Authorization (EUA). This EUA will remain  in effect (meaning this test can be used) for the duration of the COVID-19 declaration under Section 564(b)(1) of the Act, 21 U.S.C.section 360bbb-3(b)(1), unless the authorization is terminated  or revoked sooner.       Influenza A by PCR NEGATIVE NEGATIVE Final   Influenza B by PCR NEGATIVE NEGATIVE Final    Comment: (NOTE) The Xpert Xpress SARS-CoV-2/FLU/RSV plus assay is intended as an aid in the diagnosis of influenza from Nasopharyngeal swab specimens and should not be used as a sole basis for treatment. Nasal washings and aspirates are unacceptable for Xpert Xpress SARS-CoV-2/FLU/RSV testing.  Fact Sheet for Patients: EntrepreneurPulse.com.au  Fact Sheet for Healthcare Providers: IncredibleEmployment.be  This test is not yet approved or cleared by the [place] FDA and has been authorized for detection and/or diagnosis of SARS-CoV-2 by FDA under an Emergency Use Authorization (EUA). This EUA will remain in effect (meaning this test can be used) for the duration of the COVID-19 declaration under Section 564(b)(1) of the Act, 21 U.S.C. section 360bbb-3(b)(1), unless the authorization is terminated or revoked.  Performed at [facility], [place] [address], [place], [place] [address]   MRSA PCR Screening     Status: Abnormal   Collection Time: 09/26/20 10:14 AM   Specimen: Nasopharyngeal  Result Value Ref Range Status   MRSA by PCR POSITIVE (A) NEGATIVE Final    Comment:        The GeneXpert MRSA Assay (FDA approved for NASAL specimens only), is one component of a comprehensive MRSA colonization surveillance program. It is not intended to diagnose MRSA infection nor to guide or monitor treatment for MRSA infections. RESULT CALLED TO, READ BACK BY AND VERIFIED WITH: F,VEATTH @1317  09/26/20 EB Performed at [facility], [place] [address], [place], [place] [address]     Radiology Reports DG Chest 2 View  Result Date: 09/24/2020 CLINICAL DATA:  Chest pain, shortness of breath and weakness. EXAM: CHEST - 2 VIEW COMPARISON:  August 07, 2020 FINDINGS: Decreased lung volumes are noted. Mild atelectasis and/or infiltrate is seen within the right lung  base. There is a small right pleural effusion. The cardiac silhouette is markedly enlarged and unchanged in size. Degenerative changes seen throughout the thoracic spine and bilateral shoulders. IMPRESSION: 1. Stable cardiomegaly with mild right basilar atelectasis and/or infiltrate. 2. Small right pleural effusion. Electronically Signed   By: Virgina Norfolk M.D.   On: 09/24/2020 15:23    Lab Data:  CBC: Recent Labs  Lab 09/24/20 1447 09/25/20 0331 09/26/20 0203 09/27/20 0216  WBC 3.1* 3.1* 3.3* 2.8*  HGB 10.6* 10.6* 10.7* 10.4*  HCT 33.7* 32.9* 34.1* 32.5*  MCV 92.3 90.4 90.9 90.3  PLT 102* 93* 94* 92*   Basic Metabolic Panel: Recent Labs  Lab 09/24/20 1447 09/25/20 0331 09/26/20 0203 09/27/20 0216  NA 144 142 143 143  K 3.6 3.7 3.5 3.5  CL 105 105 107 104  CO2 27 24 23 23   GLUCOSE 134* 167* 98 109*  BUN 81* 79* 83* 85*  CREATININE 4.31* 4.08* 3.88* 3.71*  CALCIUM 7.9* 7.8* 7.8* 7.8*  MG  --  2.1  --   --    GFR: Estimated Creatinine Clearance: 15.4 mL/min (A) (by C-G formula based on SCr of 3.71 mg/dL (H)). Liver Function Tests: No results for input(s): AST, ALT, ALKPHOS, BILITOT, PROT, ALBUMIN in the last 168 hours. No results for input(s): LIPASE, AMYLASE in the last 168 hours. No results for input(s): AMMONIA in the last 168 hours. Coagulation Profile: No results for input(s): INR, PROTIME in the last 168 hours. Cardiac Enzymes: No results for input(s): CKTOTAL, CKMB, CKMBINDEX, TROPONINI in the last 168 hours. BNP (last 3 results) No results for input(s): PROBNP in the last 8760 hours. HbA1C: No results for input(s):  HGBA1C in the last 72 hours. CBG: No results for input(s): GLUCAP in the last 168 hours. Lipid Profile: No results for input(s): CHOL, HDL, LDLCALC, TRIG, CHOLHDL, LDLDIRECT in the last 72 hours. Thyroid Function Tests: No results for input(s): TSH, T4TOTAL, FREET4, T3FREE, THYROIDAB in the last 72 hours. Anemia Panel: No results for input(s): VITAMINB12, FOLATE, FERRITIN, TIBC, IRON, RETICCTPCT in the last 72 hours. Urine analysis:    Component Value Date/Time   COLORURINE YELLOW 08/12/2020 1155   APPEARANCEUR HAZY (A) 08/12/2020 1155   LABSPEC 1.013 08/12/2020 1155   PHURINE 5.0 08/12/2020 1155   GLUCOSEU NEGATIVE 08/12/2020 1155   HGBUR LARGE (A) 08/12/2020 1155   BILIRUBINUR NEGATIVE 08/12/2020 1155   KETONESUR NEGATIVE 08/12/2020 1155   PROTEINUR 100 (A) 08/12/2020 1155   UROBILINOGEN 0.2 03/06/2014 1644   NITRITE POSITIVE (A) 08/12/2020 1155   LEUKOCYTESUR TRACE (A) 08/12/2020 1155     Bonnell Public M.D. [facility] 09/27/2020, 5:18 AM  Available via Epic secure chat 7am-7pm After 7 pm, please refer to night coverage provider listed on amion.

## 2020-09-27 NOTE — Progress Notes (Signed)
[facility]                                                                              Patient Demographics  Vanessa [name], is a 65 y.o. female, DOB - 1956-01-11, DGU:[id number]  Admit date - 09/24/2020   Admitting Physician Lenore Cordia, MD  Outpatient Primary MD for the patient is Patient, No Pcp Per  Outpatient specialists:   LOS - 2  days   Medical records reviewed and are as summarized below:    Chief Complaint  Patient presents with  . Chest Pain       Brief summary   Patient is a 65 year old female with chronic combined systolic and diastolic CHF (EF 95-63% by TTE 08/08/2020), atrial fibrillation (not on anticoagulation due to chronic bleeding from endometrial cancer), CKD stage IV, T2DM, HTN, anemia of chronic disease, hx of MSSA bacteremia, stage I endometrial cancer s/p palliative radiation presented to ED from SNF with shortness of breath and peripheral edema.  Patient recently admitted from 08/07/2020-08/18/2020 for acute on chronic combined systolic and diastolic CHF exacerbation, AKI on CKD stage IV, and new onset A. fib with RVR.  Patient was managed with IV diuresis before transition to oral torsemide. A. fib/flutter was controlled with Coreg.  Anticoagulation not started due to high risk of bleeding.  Patient was ultimately discharged to SNF with palliative care.  Patient reports worsening shortness of breath over the last few days.  She has been experiencing orthopnea and dyspnea on exertion.  She has seen increased swelling in both of her legs.  She reports intermittent chest pain but none currently.  She reports good urine output without dysuria.  She has not had any increased abdominal swelling compared to her baseline.  She denies any obvious bleeding  Patient was admitted for acute on chronic CHF exacerbation, given Lasix 60 mg IV x1 in ED.  BNP 1724, chest x-ray with cardiomegaly, mild atelectasis of right lung base, small right pleural  effusion  09/26/2020: Patient seen.  Available records reviewed.  Severe renal disease is noted.  According to the patient, she is known to the nephrology team. 09/27/2020: Patient seen.  No new changes today.  Physical therapy input is appreciated.  Renal function is almost back to baseline.  Patient will need to follow with nephrology on discharge. Assessment & Plan   Principal Problem:   Acute on chronic combined systolic (congestive) and diastolic (congestive) heart failure ([place]) -2D echo 07/2020 had shown EF of 35 to 40%, presented with progressive peripheral edema, orthopnea, DOE, BNP 1724 -Continue IV Lasix 40 mg twice daily, received Lasix 60 mg IV x1 in ED -Continue strict I's and O's and daily weights -Continue Coreg, not on ACE/ARB/spironolactone due to renal dysfunction 09/26/2020: CHF symptoms seem to be improving. 09/27/2020: We will increase the dose of IV Lasix from 40 mg twice daily to 80 mg twice daily.  Continue to monitor renal function and electrolytes.  Strict I's and O's.  Cardiac diet.  Avoid excessive sodium intake.    Active problems Acute kidney injury superimposed on CKD stage IV -Likely worsened due to acute on chronic CHF,  baseline creatinine 3.6-3.7 -Presented with creatinine of 4.3, continue diuresis and monitor renal function -Creatinine improving, 4.0 today 09/26/2020: Patient is known to the nephrology team.  Have low threshold to consult the nephrology team. 09/27/2020: Renal function may be close to baseline.  Continue to monitor renal function, electrolytes, I's and O's.  Patient will need to follow with nephrology on discharge.  Paroxysmal atrial fibrillation -continue Coreg 25 mg twice daily, HR slightly elevated in low 100's, in atrial fibrillation -Not on anticoagulation due to high risk of bleeding  Essential hypertension -BP stable, continue Coreg, hydralazine, Imdur -Continue IV Lasix  Thrombocytopenia -Unclear etiology, mild no active bleeding,  monitor counts  History of endometrial cancer with chronic abnormal uterine bleeding Anemia due to chronic blood loss and CKD stage IV -Baseline ~9 Currently stable, 10.6   Obesity Estimated body mass index is 44.17 kg/m as calculated from the following:   Height as of this encounter: 4\' 11"  (1.499 m).   Weight as of this encounter: 99.2 kg.  Code Status: DNR DVT Prophylaxis:  SCDs Start: 09/24/20 2239   Level of Care: Level of care: Telemetry Cardiac Family Communication: Discussed all imaging results, lab results, explained to the patient   Disposition Plan:     Status is: Observation  The patient will require care spanning > 2 midnights and should be moved to inpatient because: Inpatient level of care appropriate due to severity of illness  Dispo: The patient is from: SNF              Anticipated d/c is to: SNF              Patient currently is not medically stable to d/c.  On IV Lasix for diuresis   Difficult to place patient No      Time Spent in minutes   35 minutes  Procedures:  None Consultants:   None  Antimicrobials:   Anti-infectives (From admission, onward)   None         Medications  Scheduled Meds: . carvedilol  25 mg Oral BID WC  . Chlorhexidine Gluconate Cloth  6 each Topical Q0600  . ferrous sulfate  325 mg Oral BID WC  . furosemide  40 mg Intravenous Q12H  . hydrALAZINE  50 mg Oral Q8H  . hydrocortisone cream   Topical BID  . isosorbide mononitrate  30 mg Oral Daily  . mupirocin ointment  1 application Nasal BID  . nutrition supplement (JUVEN)  1 packet Oral BID BM  . potassium chloride  10 mEq Oral BID  . sodium chloride flush  3 mL Intravenous Q12H   Continuous Infusions: PRN Meds:.acetaminophen **OR** acetaminophen, hydrOXYzine, ondansetron **OR** ondansetron (ZOFRAN) IV, phenylephrine, polyethylene glycol      Subjective:  Shortness of breath and dyspnea on exertion have improved.  Objective:   Vitals:   09/27/20  0031 09/27/20 0422 09/27/20 0600 09/27/20 1335  BP: (!) 117/104 (!) 136/98  131/84  Pulse: (!) 112 (!) 108  (!) 110  Resp: 18 18  18   Temp: 97.9 F (36.6 C) 97.8 F (36.6 C)  97.9 F (36.6 C)  TempSrc: Oral Oral  Axillary  SpO2: 95% 100%    Weight:   99.2 kg   Height:        Intake/Output Summary (Last 24 hours) at 09/27/2020 1658 Last data filed at 09/27/2020 0427 Gross per 24 hour  Intake --  Output 400 ml  Net -400 ml     Wt Readings from Last  3 Encounters:  09/27/20 99.2 kg  08/17/20 98 kg  06/10/20 117.9 kg     Exam  General: Patient is obese.  Patient is not in any distress.  Patient is awake and alert.    Cardiovascular: S1 S2, irregularly irregular.  Respiratory: Decreased air entry.  Gastrointestinal: Obese, soft and nontender.   Ext: Bilateral lower extremity edema.  Neuro: Patient is awake and alert.  Patient moves all extremities.  Data Reviewed:  I have personally reviewed following labs and imaging studies  Micro Results Recent Results (from the past 240 hour(s))  Resp Panel by RT-PCR (Flu A&B, Covid) Nasopharyngeal Swab     Status: None   Collection Time: 09/24/20  7:31 PM   Specimen: Nasopharyngeal Swab; Nasopharyngeal(NP) swabs in vial transport medium  Result Value Ref Range Status   SARS Coronavirus 2 by RT PCR NEGATIVE NEGATIVE Final    Comment: (NOTE) SARS-CoV-2 target nucleic acids are NOT DETECTED.  The SARS-CoV-2 RNA is generally detectable in upper respiratory specimens during the acute phase of infection. The lowest concentration of SARS-CoV-2 viral copies this assay can detect is 138 copies/mL. A negative result does not preclude SARS-Cov-2 infection and should not be used as the sole basis for treatment or other patient management decisions. A negative result may occur with  improper specimen collection/handling, submission of specimen other than nasopharyngeal swab, presence of viral mutation(s) within the areas targeted by  this assay, and inadequate number of viral copies(<138 copies/mL). A negative result must be combined with clinical observations, patient history, and epidemiological information. The expected result is Negative.  Fact Sheet for Patients:  EntrepreneurPulse.com.au  Fact Sheet for Healthcare Providers:  IncredibleEmployment.be  This test is no t yet approved or cleared by the [place] FDA and  has been authorized for detection and/or diagnosis of SARS-CoV-2 by FDA under an Emergency Use Authorization (EUA). This EUA will remain  in effect (meaning this test can be used) for the duration of the COVID-19 declaration under Section 564(b)(1) of the Act, 21 U.S.C.section 360bbb-3(b)(1), unless the authorization is terminated  or revoked sooner.       Influenza A by PCR NEGATIVE NEGATIVE Final   Influenza B by PCR NEGATIVE NEGATIVE Final    Comment: (NOTE) The Xpert Xpress SARS-CoV-2/FLU/RSV plus assay is intended as an aid in the diagnosis of influenza from Nasopharyngeal swab specimens and should not be used as a sole basis for treatment. Nasal washings and aspirates are unacceptable for Xpert Xpress SARS-CoV-2/FLU/RSV testing.  Fact Sheet for Patients: EntrepreneurPulse.com.au  Fact Sheet for Healthcare Providers: IncredibleEmployment.be  This test is not yet approved or cleared by the [place] FDA and has been authorized for detection and/or diagnosis of SARS-CoV-2 by FDA under an Emergency Use Authorization (EUA). This EUA will remain in effect (meaning this test can be used) for the duration of the COVID-19 declaration under Section 564(b)(1) of the Act, 21 U.S.C. section 360bbb-3(b)(1), unless the authorization is terminated or revoked.  Performed at [facility], [place] [address], [place], [place] [address]   MRSA PCR Screening     Status: Abnormal   Collection Time: 09/26/20 10:14 AM    Specimen: Nasopharyngeal  Result Value Ref Range Status   MRSA by PCR POSITIVE (A) NEGATIVE Final    Comment:        The GeneXpert MRSA Assay (FDA approved for NASAL specimens only), is one component of a comprehensive MRSA colonization surveillance program. It is not intended to diagnose MRSA infection  nor to guide or monitor treatment for MRSA infections. RESULT CALLED TO, READ BACK BY AND VERIFIED WITH: F,VEATTH @1317  09/26/20 EB Performed at [facility], [place] [address], [place], [place] [address]     Radiology Reports DG Chest 2 View  Result Date: 09/24/2020 CLINICAL DATA:  Chest pain, shortness of breath and weakness. EXAM: CHEST - 2 VIEW COMPARISON:  August 07, 2020 FINDINGS: Decreased lung volumes are noted. Mild atelectasis and/or infiltrate is seen within the right lung base. There is a small right pleural effusion. The cardiac silhouette is markedly enlarged and unchanged in size. Degenerative changes seen throughout the thoracic spine and bilateral shoulders. IMPRESSION: 1. Stable cardiomegaly with mild right basilar atelectasis and/or infiltrate. 2. Small right pleural effusion. Electronically Signed   By: Virgina Norfolk M.D.   On: 09/24/2020 15:23    Lab Data:  CBC: Recent Labs  Lab 09/24/20 1447 09/25/20 0331 09/26/20 0203 09/27/20 0216  WBC 3.1* 3.1* 3.3* 2.8*  HGB 10.6* 10.6* 10.7* 10.4*  HCT 33.7* 32.9* 34.1* 32.5*  MCV 92.3 90.4 90.9 90.3  PLT 102* 93* 94* 92*   Basic Metabolic Panel: Recent Labs  Lab 09/24/20 1447 09/25/20 0331 09/26/20 0203 09/27/20 0216  NA 144 142 143 143  K 3.6 3.7 3.5 3.5  CL 105 105 107 104  CO2 27 24 23 23   GLUCOSE 134* 167* 98 109*  BUN 81* 79* 83* 85*  CREATININE 4.31* 4.08* 3.88* 3.71*  CALCIUM 7.9* 7.8* 7.8* 7.8*  MG  --  2.1  --   --    GFR: Estimated Creatinine Clearance: 15.4 mL/min (A) (by C-G formula based on SCr of 3.71 mg/dL (H)). Liver Function Tests: No results for input(s): AST, ALT,  ALKPHOS, BILITOT, PROT, ALBUMIN in the last 168 hours. No results for input(s): LIPASE, AMYLASE in the last 168 hours. No results for input(s): AMMONIA in the last 168 hours. Coagulation Profile: No results for input(s): INR, PROTIME in the last 168 hours. Cardiac Enzymes: No results for input(s): CKTOTAL, CKMB, CKMBINDEX, TROPONINI in the last 168 hours. BNP (last 3 results) No results for input(s): PROBNP in the last 8760 hours. HbA1C: No results for input(s): HGBA1C in the last 72 hours. CBG: No results for input(s): GLUCAP in the last 168 hours. Lipid Profile: No results for input(s): CHOL, HDL, LDLCALC, TRIG, CHOLHDL, LDLDIRECT in the last 72 hours. Thyroid Function Tests: No results for input(s): TSH, T4TOTAL, FREET4, T3FREE, THYROIDAB in the last 72 hours. Anemia Panel: No results for input(s): VITAMINB12, FOLATE, FERRITIN, TIBC, IRON, RETICCTPCT in the last 72 hours. Urine analysis:    Component Value Date/Time   COLORURINE YELLOW 08/12/2020 1155   APPEARANCEUR HAZY (A) 08/12/2020 1155   LABSPEC 1.013 08/12/2020 1155   PHURINE 5.0 08/12/2020 1155   GLUCOSEU NEGATIVE 08/12/2020 1155   HGBUR LARGE (A) 08/12/2020 1155   BILIRUBINUR NEGATIVE 08/12/2020 1155   KETONESUR NEGATIVE 08/12/2020 1155   PROTEINUR 100 (A) 08/12/2020 1155   UROBILINOGEN 0.2 03/06/2014 1644   NITRITE POSITIVE (A) 08/12/2020 1155   LEUKOCYTESUR TRACE (A) 08/12/2020 1155     Bonnell Public M.D. [facility] 09/27/2020, 4:58 PM  Available via Epic secure chat 7am-7pm After 7 pm, please refer to night coverage provider listed on amion.

## 2020-09-27 NOTE — Evaluation (Signed)
Physical Therapy Evaluation Patient Details Name: Vanessa [name] MRN: [id number] DOB: 04/11/1956 Today's Date: 09/27/2020   History of Present Illness  Patient is a 65 y/o female who presents with SOB, weakness, edema and dizziness. Admitted with volume overload secondary to heart failure and CKD. Recent admission 1/28-2/8 for CHF exacerbation, AKI and new A-fib with RVR. PMH includes endometrial carcinoma 2021, COVID-19 PNA 2021, MSSA baceteremia, essential HTN, DM2, CKD, chronic diastolic HF.  Clinical Impression  Patient presents with generalized weakness, decreased activity tolerance and impaired mobility s/p above. Pt is from [facility] and reports doing minimal ambulation with use of RW for support and needing some assist for ADLs. Today, pt tolerated Mod A for bed mobility due to LE edema/weakness, Min guard-Min A for transfers and Min guard with use of RW for ambulation. Pt eager to return home with daughter support if able and if she continues to progress. If pt returns home, recommend hospital bed, BSC, HH aide and help setting up meals on wheels. If pt not able to progress, or daughter not able to provide necessary assist, recommend return to SNF. Will follow acutely to maximize independence and mobility prior to return home.    Follow Up Recommendations Home health PT (vs SNF pending progress with hopes for home)    Equipment Recommendations  Hospital bed;3in1 (PT)    Recommendations for Other Services OT consult     Precautions / Restrictions Precautions Precautions: Fall Restrictions Weight Bearing Restrictions: No      Mobility  Bed Mobility Overal bed mobility: Needs Assistance Bed Mobility: Rolling;Sidelying to Sit;Supine to Sit Rolling: Min assist Sidelying to sit: Mod assist;HOB elevated Supine to sit: Mod assist     General bed mobility comments: Able to bring LEs to EOB with Min A, reach for rail and assist needed to elevate trunk to get to EOB. Assist  to bring LEs into bed to return to supine,    Transfers Overall transfer level: Needs assistance Equipment used: Rolling walker (2 wheeled) Transfers: Sit to/from Stand Sit to Stand: Min assist;Min guard         General transfer comment: Min guard-Min A depending on surface height to stand from EOB x1, from low toilet x1.  Ambulation/Gait Ambulation/Gait assistance: Min guard Gait Distance (Feet): 22 Feet (x2 bouts) Assistive device: Rolling walker (2 wheeled) Gait Pattern/deviations: Step-through pattern;Decreased stride length;Shuffle;Wide base of support;Trunk flexed Gait velocity: decreased   General Gait Details: Slow, mostly steady gait with use of RW; 1 seated rest break on toilet. Cues for upright. HR up to 113 bpm.  Stairs            Wheelchair Mobility    Modified Rankin (Stroke Patients Only)       Balance Overall balance assessment: Needs assistance Sitting-balance support: Feet supported;Bilateral upper extremity supported Sitting balance-Leahy Scale: Fair Sitting balance - Comments: Requires Ue support sitting EOB initially.   Standing balance support: During functional activity Standing balance-Leahy Scale: Poor Standing balance comment: Requires UE support for standing.                             Pertinent Vitals/Pain Pain Assessment: Faces Faces Pain Scale: Hurts little more Pain Location: bottom due to hemorrhoids Pain Descriptors / Indicators: Discomfort Pain Intervention(s): Monitored during session    Home Living Family/patient expects to be discharged to:: Private residence Living Arrangements: Children (daughter and son in [employer], grandchildren) Available Help at Discharge: Family;Available 24  hours/day (daughter) Type of Home: Apartment Home Access: Level entry     Home Layout: One level Home Equipment: Walker - 2 wheels;Cane - single point;Bedside commode      Prior Function Level of Independence: Needs assistance    Gait / Transfers Assistance Needed: Has been using RW for short distance ambulation at SNF  ADL's / Homemaking Assistance Needed: Able to assist with ADLs at SNF.  Comments: Has been at [facility] since last admission in February.     Hand Dominance   Dominant Hand: Right    Extremity/Trunk Assessment   Upper Extremity Assessment Upper Extremity Assessment: Defer to OT evaluation    Lower Extremity Assessment Lower Extremity Assessment: Generalized weakness;LLE deficits/detail;RLE deficits/detail RLE Deficits / Details: Swelling present throughout LE. Grossly ~3/5 throughout. LLE Deficits / Details: Decreased sensation distal to knee and into foot. Swelling present. LLE Sensation: decreased light touch       Communication   Communication: No difficulties  Cognition Arousal/Alertness: Awake/alert Behavior During Therapy: WFL for tasks assessed/performed Overall Cognitive Status: Within Functional Limits for tasks assessed                                 General Comments: For basic mobility tasks. Seems aware of current functional status and need to improve to get home.      General Comments General comments (skin integrity, edema, etc.): Pt wants to try to get home with daughter but will need some services to help facilitate that as well as DME re: hospital bed, reacher, meals on wheels, [place] aide.    Exercises     Assessment/Plan    PT Assessment Patient needs continued PT services  PT Problem List Decreased strength;Decreased range of motion;Decreased activity tolerance;Decreased balance;Decreased mobility;Cardiopulmonary status limiting activity;Obesity;Decreased skin integrity;Impaired sensation       PT Treatment Interventions DME instruction;Gait training;Functional mobility training;Therapeutic activities;Therapeutic exercise;Balance training;Patient/family education    PT Goals (Current goals can be found in the Care Plan section)  Acute  Rehab PT Goals Patient Stated Goal: to get better and go home with my daughter PT Goal Formulation: With patient Time For Goal Achievement: 10/11/20 Potential to Achieve Goals: Fair    Frequency Min 3X/week   Barriers to discharge        Co-evaluation               AM-PAC PT "6 Clicks" Mobility  Outcome Measure Help needed turning from your back to your side while in a flat bed without using bedrails?: A Little Help needed moving from lying on your back to sitting on the side of a flat bed without using bedrails?: A Lot Help needed moving to and from a bed to a chair (including a wheelchair)?: A Little Help needed standing up from a chair using your arms (e.g., wheelchair or bedside chair)?: A Little Help needed to walk in hospital room?: A Little Help needed climbing 3-5 steps with a railing? : A Lot 6 Click Score: 16    End of Session Equipment Utilized During Treatment: Gait belt Activity Tolerance: Patient tolerated treatment well Patient left: in bed;with call bell/phone within reach;with bed alarm set Nurse Communication: Mobility status;Other (comment) (purewick) PT Visit Diagnosis: Unsteadiness on feet (R26.81);Muscle weakness (generalized) (M62.81);Difficulty in walking, not elsewhere classified (R26.2)    Time: 1332-1400 PT Time Calculation (min) (ACUTE ONLY): 28 min   Charges:   PT Evaluation $PT Eval Moderate Complexity: 1 Mod  PT Treatments $Therapeutic Activity: 8-22 mins        Marisa Severin, PT, DPT Acute Rehabilitation Services Pager [phone] Office [phone]      Lacie Draft 09/27/2020, 2:49 PM

## 2020-09-28 DIAGNOSIS — I5043 Acute on chronic combined systolic (congestive) and diastolic (congestive) heart failure: Secondary | ICD-10-CM | POA: Diagnosis not present

## 2020-09-28 LAB — COMPREHENSIVE METABOLIC PANEL
ALT: 8 U/L (ref 0–44)
AST: 14 U/L — ABNORMAL LOW (ref 15–41)
Albumin: 2.8 g/dL — ABNORMAL LOW (ref 3.5–5.0)
Alkaline Phosphatase: 56 U/L (ref 38–126)
Anion gap: 12 (ref 5–15)
BUN: 89 mg/dL — ABNORMAL HIGH (ref 8–23)
CO2: 24 mmol/L (ref 22–32)
Calcium: 8 mg/dL — ABNORMAL LOW (ref 8.9–10.3)
Chloride: 107 mmol/L (ref 98–111)
Creatinine, Ser: 3.78 mg/dL — ABNORMAL HIGH (ref 0.44–1.00)
GFR, Estimated: 13 mL/min — ABNORMAL LOW (ref >60)
Glucose, Bld: 93 mg/dL (ref 70–99)
Potassium: 3.7 mmol/L (ref 3.5–5.1)
Sodium: 143 mmol/L (ref 135–145)
Total Bilirubin: 0.7 mg/dL (ref 0.3–1.2)
Total Protein: 6.9 g/dL (ref 6.5–8.1)

## 2020-09-28 LAB — CBC
HCT: 34 % — ABNORMAL LOW (ref 36.0–46.0)
Hemoglobin: 10.7 g/dL — ABNORMAL LOW (ref 12.0–15.0)
MCH: 28.5 pg (ref 26.0–34.0)
MCHC: 31.5 g/dL (ref 30.0–36.0)
MCV: 90.4 fL (ref 80.0–100.0)
Platelets: 98 10*3/uL — ABNORMAL LOW (ref 150–400)
RBC: 3.76 MIL/uL — ABNORMAL LOW (ref 3.87–5.11)
RDW: 17.3 % — ABNORMAL HIGH (ref 11.5–15.5)
WBC: 2.6 10*3/uL — ABNORMAL LOW (ref 4.0–10.5)
nRBC: 1.2 % — ABNORMAL HIGH (ref 0.0–0.2)

## 2020-09-28 NOTE — Plan of Care (Signed)
  Problem: Nutrition: Goal: Adequate nutrition will be maintained Outcome: Progressing   Problem: Safety: Goal: Ability to remain free from injury will improve Outcome: Progressing   

## 2020-09-28 NOTE — NC FL2 (Signed)
[place] LEVEL OF CARE SCREENING TOOL     IDENTIFICATION  Patient Name: Vanessa [name] Birthdate: [birth year] Sex: female Admission Date (Current Location): 09/24/2020  [facility] and [place] Number:  Herbalist and Address:  [facility] [place] [address], [place], [place] [address]      Provider Number: [id number]  Attending Physician Name and Address:  Shawna Clamp, MD  Relative Name and Phone Number:  Vernie Shanks [phone]    Current Level of Care: Hospital Recommended Level of Care: [place] Prior Approval Number:    Date Approved/Denied:   PASRR Number: [id number]  Discharge Plan: SNF    Current Diagnoses: Patient Active Problem List   Diagnosis Date Noted  . Acute on chronic combined systolic (congestive) and diastolic (congestive) heart failure ([place]) 09/24/2020  . Acute respiratory failure with hypoxia ([place]) 08/08/2020  . Acute diastolic CHF (congestive heart failure) ([place]) 08/08/2020  . Elevated troponin 08/08/2020  . Abnormal uterine bleeding due to primary malignant neoplasm of endometrium ([place]) 05/24/2020  . Cellulitis 04/23/2020  . Bilateral lower leg cellulitis 04/22/2020  . Neurocognitive deficits 12/11/2019  . Edema 12/04/2019  . Acute kidney injury superimposed on CKD ([place]) 12/03/2019  . Hypothermia 11/26/2019  . CKD (chronic kidney disease) stage 3, GFR 30-59 ml/min (HCC)   . Hypernatremia   . COVID-19 virus infection   . Generalized weakness   . Hypertensive urgency   . Allergic angioedema 10/19/2019  . Anasarca 10/18/2019  . Angioedema 10/18/2019  . Hypertensive emergency 10/18/2019  . Trichimoniasis 10/17/2019  . Macrocytic anemia 10/05/2019  . Esophageal dysmotility 10/04/2019  . Pressure injury of skin 09/22/2019  . Endometrial cancer ([place]) 09/21/2019  . Left humeral fracture 09/21/2019  . Iron deficiency anemia due to chronic blood loss 09/21/2019  . MSSA bacteremia  09/18/2019  . Postmenopausal bleeding 09/15/2019  . Thrombocytopenia ([place]) 09/15/2019  . Generalized pruritus 09/15/2019  . Benign hypertension with CKD (chronic kidney disease) stage III ([place]) 09/15/2019  . Hyperuricemia 09/15/2019  . Hypokalemia 09/15/2019  . Symptomatic anemia 09/14/2019  . Non compliance w medication regimen 03/24/2015  . Dyslipidemia 03/24/2015  . DM type 2 (diabetes mellitus, type 2) ([place]) 05/23/2014  . HTN (hypertension) 05/23/2014    Orientation RESPIRATION BLADDER Height & Weight     Self,Time,Situation,Place  Normal Incontinent,External catheter (External Urinary Catheter) Weight: 221 lb 14.4 oz (100.7 kg) Height:  4\' 11"  (149.9 cm)  BEHAVIORAL SYMPTOMS/MOOD NEUROLOGICAL BOWEL NUTRITION STATUS      Incontinent Diet (See Discharge Summary)  AMBULATORY STATUS COMMUNICATION OF NEEDS Skin   Limited Assist Verbally Other (Comment),Skin abrasions (Dry,Itching,lotion,hydrocortisone,abrasion,blister leg,bilateral,foam,blister,leg right,left)                       Personal Care Assistance Level of Assistance  Bathing,Feeding,Dressing Bathing Assistance: Limited assistance Feeding assistance: Independent (able to feed self) Dressing Assistance: Limited assistance     Functional Limitations Info  Sight,Hearing,Speech Sight Info: Impaired Hearing Info: Adequate Speech Info: Adequate    SPECIAL CARE FACTORS FREQUENCY  PT (By licensed PT),OT (By licensed OT)     PT Frequency: 5x min weekly OT Frequency: 5x min weekly            Contractures Contractures Info: Not present    Additional Factors Info  Code Status Code Status Info: DNR Allergies Info: Ace Inhibitors,Angiotensin Receptor Blockers           Current Medications (09/28/2020):  This is the current hospital  active medication list Current Facility-Administered Medications  Medication Dose Route Frequency Provider Last Rate Last Admin  . acetaminophen (TYLENOL) tablet 650 mg  650 mg  Oral Q6H PRN Lenore Cordia, MD   650 mg at 09/28/20 1287   Or  . acetaminophen (TYLENOL) suppository 650 mg  650 mg Rectal Q6H PRN Zada Finders R, MD      . carvedilol (COREG) tablet 25 mg  25 mg Oral BID WC Lenore Cordia, MD   25 mg at 09/28/20 0820  . Chlorhexidine Gluconate Cloth 2 % PADS 6 each  6 each Topical Q0600 Bonnell Public, MD   6 each at 09/28/20 0602  . ferrous sulfate tablet 325 mg  325 mg Oral BID WC Zada Finders R, MD   325 mg at 09/28/20 0820  . furosemide (LASIX) injection 80 mg  80 mg Intravenous BID Dana Allan I, MD   80 mg at 09/28/20 0816  . hydrALAZINE (APRESOLINE) tablet 50 mg  50 mg Oral Q8H Zada Finders R, MD   50 mg at 09/28/20 1334  . hydrocortisone cream 1 %   Topical BID Rai, Vernelle Emerald, MD   Given at 09/28/20 1001  . hydrOXYzine (ATARAX/VISTARIL) tablet 25 mg  25 mg Oral TID PRN Lenore Cordia, MD   25 mg at 09/28/20 1030  . isosorbide mononitrate (IMDUR) 24 hr tablet 30 mg  30 mg Oral Daily Lenore Cordia, MD   30 mg at 09/28/20 0958  . mupirocin ointment (BACTROBAN) 2 % 1 application  1 application Nasal BID Dana Allan I, MD   1 application at 86/76/72 1001  . nutrition supplement (JUVEN) (JUVEN) powder packet 1 packet  1 packet Oral BID BM Lenore Cordia, MD   1 packet at 09/28/20 [phone]  . ondansetron (ZOFRAN) tablet 4 mg  4 mg Oral Q6H PRN Lenore Cordia, MD       Or  . ondansetron (ZOFRAN) injection 4 mg  4 mg Intravenous Q6H PRN Zada Finders R, MD      . phenylephrine ((USE for PREPARATION-H)) 0.25 % suppository 1 suppository  1 suppository Rectal BID PRN Rai, Ripudeep K, MD   1 suppository at 09/27/20 1204  . polyethylene glycol (MIRALAX / GLYCOLAX) packet 17 g  17 g Oral Daily PRN Zada Finders R, MD      . potassium chloride (KLOR-CON) CR tablet 10 mEq  10 mEq Oral BID Lenore Cordia, MD   10 mEq at 09/28/20 0958  . sodium chloride flush (NS) 0.9 % injection 3 mL  3 mL Intravenous Q12H Lenore Cordia, MD   3 mL at 09/28/20  1002     Discharge Medications: Please see discharge summary for a list of discharge medications.  Relevant Imaging Results:  Relevant Lab Results:   Additional Information [phone]  Trula Ore, LCSWA

## 2020-09-28 NOTE — Progress Notes (Signed)
Physical Therapy Treatment Patient Details Name: Vanessa [name] MRN: [id number] DOB: 1955/08/19 Today's Date: 09/28/2020    History of Present Illness Patient is a 65 y/o female who presents with SOB, weakness, edema and dizziness. Admitted with volume overload secondary to heart failure and CKD. Recent admission 1/28-2/8 for CHF exacerbation, AKI and new A-fib with RVR. PMH includes endometrial carcinoma 2021, COVID-19 PNA 2021, MSSA baceteremia, essential HTN, DM2, CKD, chronic diastolic HF.    PT Comments    Patient progressing slowly towards PT goals. Improved ambulation distance with min guard assist, use of RW and chair follow for safety today. Distance limited due to LLE pain and fatigue. 1 seated rest break needed. Motivated to mobilize and walk so pt can return home with daughter. Encouraged pt to talk with daughter to see if home is an option and what she needs to do to safely be able to get there. Continues to have swelling in BLEs. VSS on RA. Will follow and progress as able.    Follow Up Recommendations  Home health PT (vs SNF pending progress and daughter's ability to help)     Equipment Recommendations  Hospital bed;3in1 (PT);Other (comment) ([place] aide)    Recommendations for Other Services       Precautions / Restrictions Precautions Precautions: Fall Restrictions Weight Bearing Restrictions: No    Mobility  Bed Mobility               General bed mobility comments: Up in chair upon PT arrival.    Transfers Overall transfer level: Needs assistance Equipment used: Rolling walker (2 wheeled) Transfers: Sit to/from Stand Sit to Stand: Min guard;Min assist         General transfer comment: Initially Min A to rise with cues for proper foot placement progressing to Min guard assist with second stand from chair.  Ambulation/Gait Ambulation/Gait assistance: Min guard Gait Distance (Feet): 28 Feet (+ 30') Assistive device: Rolling walker (2 wheeled) Gait  Pattern/deviations: Step-through pattern;Decreased stride length;Shuffle;Wide base of support;Trunk flexed Gait velocity: decreased Gait velocity interpretation: <1.31 ft/sec, indicative of household ambulator General Gait Details: Slow, mostly steady gait with use of RW; 1 seated rest break. Cues for upright. Incontinent of stool with walking. Limited due to LLE pain.   Stairs             Wheelchair Mobility    Modified Rankin (Stroke Patients Only)       Balance Overall balance assessment: Needs assistance Sitting-balance support: Feet supported;Single extremity supported Sitting balance-Leahy Scale: Fair Sitting balance - Comments: Fatigues without back support sitting in chair.   Standing balance support: During functional activity Standing balance-Leahy Scale: Poor Standing balance comment: Requires UE support for standing.                            Cognition Arousal/Alertness: Awake/alert Behavior During Therapy: WFL for tasks assessed/performed Overall Cognitive Status: Within Functional Limits for tasks assessed                                        Exercises      General Comments General comments (skin integrity, edema, etc.): Pt incontinent of stool during gait, warns PT that this happens without her control or knowledge. Will bring pull up next session. Swelling present BLEs.      Pertinent Vitals/Pain Pain Assessment: Faces Faces Pain  Scale: Hurts little more Pain Location: bottom due to hemorrhoids and LLE with mobility Pain Descriptors / Indicators: Burning;Discomfort;Sore Pain Intervention(s): Monitored during session;Repositioned    Home Living                      Prior Function            PT Goals (current goals can now be found in the care plan section) Progress towards PT goals: Progressing toward goals    Frequency    Min 3X/week      PT Plan Current plan remains appropriate     Co-evaluation              AM-PAC PT "6 Clicks" Mobility   Outcome Measure  Help needed turning from your back to your side while in a flat bed without using bedrails?: A Little Help needed moving from lying on your back to sitting on the side of a flat bed without using bedrails?: A Lot Help needed moving to and from a bed to a chair (including a wheelchair)?: A Little Help needed standing up from a chair using your arms (e.g., wheelchair or bedside chair)?: A Little Help needed to walk in hospital room?: A Little Help needed climbing 3-5 steps with a railing? : A Lot 6 Click Score: 16    End of Session Equipment Utilized During Treatment: Gait belt Activity Tolerance: Patient tolerated treatment well;Patient limited by pain Patient left: in chair;with call bell/phone within reach;with chair alarm set Nurse Communication: Mobility status PT Visit Diagnosis: Unsteadiness on feet (R26.81);Muscle weakness (generalized) (M62.81);Difficulty in walking, not elsewhere classified (R26.2);Pain Pain - Right/Left: Left Pain - part of body: Leg (bottom)     Time: 1610-9604 PT Time Calculation (min) (ACUTE ONLY): 23 min  Charges:  $Gait Training: 23-37 mins                     Marisa Severin, PT, DPT Acute Rehabilitation Services Pager [phone] Office [place] 09/28/2020, 12:10 PM

## 2020-09-28 NOTE — Evaluation (Signed)
Occupational Therapy Evaluation Patient Details Name: Vanessa [name] MRN: [id number] DOB: 12/12/1955 Today's Date: 09/28/2020    History of Present Illness Patient is a 65 y/o female who presents with SOB, weakness, edema and dizziness. Admitted with volume overload secondary to heart failure and CKD. Recent admission 1/28-2/8 for CHF exacerbation, AKI and new A-fib with RVR. PMH includes endometrial carcinoma 2021, COVID-19 PNA 2021, MSSA baceteremia, essential HTN, DM2, CKD, chronic diastolic HF.   Clinical Impression   PTA, pt was recently at SNF from recent admission, pt reports she needed assistance with ADL/IADL and functional mobility at RW level. Pt currently requires minimal assistance for toilet transfers, she requires maxA for posterior care and maxA for LB dressing. Pt limited this session secondary to fatigue, she reports increased fatigue after toilet transfer DoE 3/4 and HR up to 120bpm with exertion. Due to decline in current level of function, pt would benefit from acute OT to address established goals to facilitate safe D/C to venue listed below. At this time, recommend SNF follow-up. Will continue to follow acutely.     Follow Up Recommendations  SNF;Supervision/Assistance - 24 hour    Equipment Recommendations  Wheelchair (measurements OT);Wheelchair cushion (measurements OT);Hospital bed;3 in 1 bedside commode    Recommendations for Other Services       Precautions / Restrictions Precautions Precautions: Fall Precaution Comments: impulsive to sit Restrictions Weight Bearing Restrictions: No      Mobility Bed Mobility Overal bed mobility: Needs Assistance             General bed mobility comments: in chair upon arrival    Transfers Overall transfer level: Needs assistance Equipment used: Rolling walker (2 wheeled) Transfers: Sit to/from Stand Sit to Stand: Min assist         General transfer comment: minA to stand from recliner and from [place] Overall balance assessment: Needs assistance Sitting-balance support: Feet supported;Single extremity supported Sitting balance-Leahy Scale: Fair     Standing balance support: During functional activity Standing balance-Leahy Scale: Poor Standing balance comment: Requires UE support for standing.                           ADL either performed or assessed with clinical judgement   ADL Overall ADL's : Needs assistance/impaired Eating/Feeding: Independent;Sitting   Grooming: Wash/dry hands;Standing;Min guard Grooming Details (indicate cue type and reason): places elbows on sink Upper Body Bathing: Minimal assistance;Sitting   Lower Body Bathing: Maximal assistance;Sit to/from stand   Upper Body Dressing : Set up;Sitting   Lower Body Dressing: Maximal assistance;Sit to/from stand   Toilet Transfer: Min guard;Ambulation;RW   Toileting- Clothing Manipulation and Hygiene: Moderate assistance;Sit to/from stand       Functional mobility during ADLs: Minimal assistance;Rolling walker General ADL Comments: pt is interested in AE for LB ADL     Vision         Perception     Praxis      Pertinent Vitals/Pain Pain Assessment: Faces Faces Pain Scale: Hurts little more Pain Location: bottom due to hemorrhoids and LLE with mobility Pain Descriptors / Indicators: Burning;Discomfort;Sore Pain Intervention(s): Limited activity within patient's tolerance;Monitored during session     Hand Dominance Right   Extremity/Trunk Assessment Upper Extremity Assessment Upper Extremity Assessment: Overall WFL for tasks assessed   Lower Extremity Assessment Lower Extremity Assessment: Generalized weakness RLE Deficits / Details: Swelling present throughout LE. Grossly ~3/5 throughout. LLE Deficits / Details: Decreased sensation  distal to knee and into foot. Swelling present. LLE Sensation: decreased light touch       Communication Communication Communication: No  difficulties   Cognition Arousal/Alertness: Awake/alert Behavior During Therapy: WFL for tasks assessed/performed Overall Cognitive Status: Within Functional Limits for tasks assessed                                 General Comments: For basic mobility tasks. Seems aware of current functional status and need to improve to get home.   General Comments       Exercises     Shoulder Instructions      Home Living Family/patient expects to be discharged to:: Private residence Living Arrangements: Children (daughter and son in law, grandchildren) Available Help at Discharge: Family;Available 24 hours/day (daughter) Type of Home: Apartment Home Access: Level entry     Home Layout: One level     Bathroom Shower/Tub: Tub/shower unit;Walk-in shower   Bathroom Toilet: Standard     Home Equipment: Environmental consultant - 2 wheels;Cane - single point;Bedside commode   Additional Comments: from SNF, reports that she stands with Rw with assistance, may take a few steps, has needed assistance with ADLs due to weakness and continued bleeding.      Prior Functioning/Environment Level of Independence: Needs assistance  Gait / Transfers Assistance Needed: Has been using RW for short distance ambulation at SNF ADL's / Homemaking Assistance Needed: Able to assist with ADLs at SNF.   Comments: Has been at [facility] since last admission in February.        OT Problem List: Decreased strength;Impaired balance (sitting and/or standing);Decreased knowledge of use of DME or AE;Obesity;Cardiopulmonary status limiting activity      OT Treatment/Interventions: Self-care/ADL training;DME and/or AE instruction;Balance training;Patient/family education;Therapeutic activities;Energy conservation    OT Goals(Current goals can be found in the care plan section) Acute Rehab OT Goals Patient Stated Goal: to get better and go home with my daughter OT Goal Formulation: With patient Time For Goal  Achievement: 10/12/20 Potential to Achieve Goals: Fair ADL Goals Pt Will Perform Grooming: with modified independence;standing Pt Will Perform Lower Body Dressing: with modified independence;sit to/from stand Pt Will Transfer to Toilet: with supervision;ambulating  OT Frequency: Min 2X/week   Barriers to D/C:            Co-evaluation              AM-PAC OT "6 Clicks" Daily Activity     Outcome Measure Help from another person eating meals?: None Help from another person taking care of personal grooming?: A Little Help from another person toileting, which includes using toliet, bedpan, or urinal?: A Little Help from another person bathing (including washing, rinsing, drying)?: A Lot Help from another person to put on and taking off regular upper body clothing?: A Little Help from another person to put on and taking off regular lower body clothing?: A Lot 6 Click Score: 17   End of Session Equipment Utilized During Treatment: Gait belt;Rolling walker  Activity Tolerance: Patient limited by fatigue;Patient tolerated treatment well Patient left: in chair;with call bell/phone within reach;with chair alarm set  OT Visit Diagnosis: Unsteadiness on feet (R26.81);Other abnormalities of gait and mobility (R26.89);Muscle weakness (generalized) (M62.81)                Time: 2130-8657 OT Time Calculation (min): 37 min Charges:  OT General Charges $OT Visit: 1 Visit OT Evaluation $OT Eval Moderate  Complexity: 1 Mod OT Treatments $Self Care/Home Management : 8-22 mins  Helene Kelp OTR/L Acute Rehabilitation Services Office: [place] 09/28/2020, 4:37 PM

## 2020-09-28 NOTE — TOC Initial Note (Addendum)
Transition of Care [facility]) - Initial/Assessment Note    Patient Details  Name: Vanessa [name] MRN: [id number] Date of Birth: 1956/06/21  [facility] CM/SW Contact:    Trula Ore, [place] Phone Number: 09/28/2020, 4:15 PM  Clinical Narrative:             CSW received consult for possible SNF placement at time of discharge. CSW spoke with patient regarding PT recommendation of SNF placement at time of discharge. Patient comes from [facility] long term. Patient expressed understanding of PT recommendation and is agreeable to SNF placement. Patient reports preference is to return to [facility].  Patient has received the COVID vaccines.  No further questions reported at this time. CSW to continue to follow and assist with discharge planning needs.        Expected Discharge Plan: Skilled Nursing Facility Barriers to Discharge: Continued Medical Work up   Patient Goals and CMS Choice Patient states their goals for this hospitalization and ongoing recovery are:: to go to SNF CMS Medicare.gov Compare Post Acute Care list provided to:: Patient Choice offered to / list presented to : Patient  Expected Discharge Plan and Services Expected Discharge Plan: [place] In-house Referral: Clinical Social Work     Living arrangements for the past 2 months: [place]                                      Prior Living Arrangements/Services Living arrangements for the past 2 months: [place] Lives with:: Self,Facility Resident (From [employer]) Patient language and need for interpreter reviewed:: No Do you feel safe going back to the place where you live?: No   SNF  Need for Family Participation in Patient Care: Yes (Comment) Care giver support system in place?: Yes (comment)   Criminal Activity/Legal Involvement Pertinent to Current Situation/Hospitalization: No - Comment as needed  Activities of Daily Living Home  Assistive Devices/Equipment: Gilford Rile (specify type) (front wheels) ADL Screening (condition at time of admission) Patient's cognitive ability adequate to safely complete daily activities?: Yes Is the patient deaf or have difficulty hearing?: No Does the patient have difficulty seeing, even when wearing glasses/contacts?: No Does the patient have difficulty concentrating, remembering, or making decisions?: No Patient able to express need for assistance with ADLs?: Yes Does the patient have difficulty dressing or bathing?: Yes Independently performs ADLs?: Yes (appropriate for developmental age) (with difficulty) Communication: Independent Dressing (OT): Needs assistance Is this a change from baseline?: Pre-admission baseline Grooming: Needs assistance Is this a change from baseline?: Pre-admission baseline Feeding: Independent Bathing: Needs assistance Is this a change from baseline?: Pre-admission baseline Toileting: Needs assistance Is this a change from baseline?: Pre-admission baseline In/Out Bed: Needs assistance Is this a change from baseline?: Pre-admission baseline Walks in Home: Independent with device (comment) Is this a change from baseline?: Pre-admission baseline Does the patient have difficulty walking or climbing stairs?: Yes Weakness of Legs: Right Weakness of Arms/Hands: Left  Permission Sought/Granted Permission sought to share information with : Case Manager,Family Chief Financial Officer Permission granted to share information with : Yes, Verbal Permission Granted  Share Information with NAME: Vernie Shanks  Permission granted to share info w AGENCY: SNF  Permission granted to share info w Relationship: daughter  Permission granted to share info w Contact Information: Vernie Shanks [phone]  Emotional Assessment Appearance:: Appears stated age Attitude/Demeanor/Rapport: Gracious Affect (typically observed): Calm Orientation: : Oriented  to  Self,Oriented to Place,Oriented to  Time,Oriented to Situation Alcohol / Substance Use: Not Applicable Psych Involvement: No (comment)  Admission diagnosis:  Acute right-sided heart failure (HCC) [I50.811] Acute on chronic combined systolic (congestive) and diastolic (congestive) heart failure ([place]) [I50.43] Patient Active Problem List   Diagnosis Date Noted  . Acute on chronic combined systolic (congestive) and diastolic (congestive) heart failure ([place]) 09/24/2020  . Acute respiratory failure with hypoxia ([place]) 08/08/2020  . Acute diastolic CHF (congestive heart failure) ([place]) 08/08/2020  . Elevated troponin 08/08/2020  . Abnormal uterine bleeding due to primary malignant neoplasm of endometrium ([place]) 05/24/2020  . Cellulitis 04/23/2020  . Bilateral lower leg cellulitis 04/22/2020  . Neurocognitive deficits 12/11/2019  . Edema 12/04/2019  . Acute kidney injury superimposed on CKD ([place]) 12/03/2019  . Hypothermia 11/26/2019  . CKD (chronic kidney disease) stage 3, GFR 30-59 ml/min (HCC)   . Hypernatremia   . COVID-19 virus infection   . Generalized weakness   . Hypertensive urgency   . Allergic angioedema 10/19/2019  . Anasarca 10/18/2019  . Angioedema 10/18/2019  . Hypertensive emergency 10/18/2019  . Trichimoniasis 10/17/2019  . Macrocytic anemia 10/05/2019  . Esophageal dysmotility 10/04/2019  . Pressure injury of skin 09/22/2019  . Endometrial cancer ([place]) 09/21/2019  . Left humeral fracture 09/21/2019  . Iron deficiency anemia due to chronic blood loss 09/21/2019  . MSSA bacteremia 09/18/2019  . Postmenopausal bleeding 09/15/2019  . Thrombocytopenia ([place]) 09/15/2019  . Generalized pruritus 09/15/2019  . Benign hypertension with CKD (chronic kidney disease) stage III ([place]) 09/15/2019  . Hyperuricemia 09/15/2019  . Hypokalemia 09/15/2019  . Symptomatic anemia 09/14/2019  . Non compliance w medication regimen 03/24/2015  . Dyslipidemia 03/24/2015  . DM type 2 (diabetes  mellitus, type 2) ([place]) 05/23/2014  . HTN (hypertension) 05/23/2014   PCP:  Patient, No Pcp Per Pharmacy:  No Pharmacies Listed    Social Determinants of Health (SDOH) Interventions    Readmission Risk Interventions Readmission Risk Prevention Plan 11/08/2019 10/21/2019 09/25/2019  Transportation Screening Complete Complete Complete  PCP or Specialist Appt within 3-5 Days - - Not Complete  Not Complete comments - - plan for SNF  [place] or [place] - - Complete  Social Work Consult for [place] Planning/Counseling - - Complete  Palliative Care Screening - - Not Applicable  Medication Review Press photographer) Complete Referral to Pharmacy Referral to Pharmacy  PCP or Specialist appointment within 3-5 days of discharge Complete Complete -  [place] or Home Care Consult Complete (No Data) -  SW Recovery Care/Counseling Consult Complete Complete -  Palliative Care Screening Not Applicable Not Applicable -  Skilled Nursing Facility Complete Complete -  Some recent data might be hidden

## 2020-09-28 NOTE — Progress Notes (Signed)
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 Patient recently admitted from 08/07/2020-08/18/2020 for acute on chronic combined systolic and diastolic CHF exacerbation, AKI on CKD stage IV, and new onset A. fib with RVR. Patient was managed with IV diuresis before transition to oral torsemide. A. fib/flutter was controlled with Coreg. Anticoagulation not started due to high risk of bleeding. Patient was ultimately discharged to SNF with palliative care. Patient is admitted for acute on chronic CHF exacerbation,  started on diuresis.  Assessment & Plan:   Principal Problem:   Acute on chronic combined systolic (congestive) and diastolic (congestive) heart failure (HCC) Active Problems:   HTN (hypertension)   Thrombocytopenia (HCC)   Acute kidney injury superimposed on CKD (HCC)   Acute on chronic combined systolic (congestive) and diastolic (congestive) heart failure ([place]) - She presented with progressive peripheral edema, orthopnea, DOE, BNP 1724. - 2D echo 07/2020 had shown EF of 35 to 40%, -Continued IV Lasix 40 mg twice daily, received Lasix 60 mg IV x1 in ED -Continue strict I's and O's and daily weights -Continue Coreg, not on ACE/ARB/spironolactone due to renal dysfunction 09/26/2020: CHF symptoms seem to be improving. 09/27/2020: Increased the dose of IV Lasix from 40 mg twice daily to 80 mg twice daily.    Continue to monitor renal function and electrolytes.  Strict I's  and O's.  Cardiac diet.  Avoid excessive sodium intake.    Active problems Acute kidney injury superimposed on CKD stage IV -Likely worsened due to acute on chronic CHF, baseline creatinine 3.6-3.7 -Presented with creatinine of 4.3, continue diuresis and monitor renal function. -Creatinine improving, 3.78 today 09/28/2020: Renal function may be close to baseline.   Continue to monitor renal function, electrolytes, I's and O's.  Patient will need to follow with nephrology on discharge.  Paroxysmal atrial fibrillation -Continue Coreg 25 mg twice daily, HR slightly elevated in low 100's, in atrial fibrillation -Not on anticoagulation due to high risk of bleeding.  Essential hypertension -BP stable, continue Coreg, hydralazine, Imdur. -Continue IV Lasix  Thrombocytopenia -Unclear etiology, mild no active bleeding, monitor counts.  History of endometrial cancer with chronic abnormal uterine bleeding Anemia due to chronic blood loss and CKD stage IV -Baseline ~9 Currently stable, 10.6   Obesity Estimated body mass index is 44.17 kg/m as calculated from the following:   Height as of this encounter: 4\' 11"  (1.499 m).   Weight as of this encounter: 99.2 kg.   DVT prophylaxis: SCDs Code Status:DNR Family Communication: No family at bed side. Disposition Plan:   Status is: Inpatient  Remains inpatient appropriate because:Inpatient level of care appropriate due to severity of illness   Dispo: The patient is from: Home              Anticipated d/c is to: SNF              Patient currently is not medically stable to d/c.   Difficult to place patient No  Consultants:    None  Procedures:  Antimicrobials:  Anti-infectives (From admission, onward)   None      Subjective: Patient was seen and examined at bedside.  She reports feeling much improved,  denies any chest pain or shortness of breath.   She does have significant bilateral lower extremity  swelling  Objective: Vitals:   09/28/20 0800 09/28/20 1148 09/28/20 1336 09/28/20 1633  BP:  123/90 123/90 (!) 124/93  Pulse:  (!) 110 (!) 106 (!) 108  Resp: 16 16 18 18   Temp:      TempSrc:      SpO2:  99% 100% 100%  Weight:      Height:        Intake/Output Summary (Last 24 hours) at 09/28/2020 1754 Last data filed at 09/28/2020 6295 Gross per 24 hour  Intake 240 ml  Output 600 ml  Net -360 ml   Filed Weights   09/26/20 0500 09/27/20 0600 09/28/20 0428  Weight: 99 kg 99.2 kg 100.7 kg    Examination:  General exam: Appears calm and comfortable , not in any acute distress Respiratory system: Clear to auscultation. Respiratory effort normal. Cardiovascular system: S1 & S2 heard, RRR. No JVD, murmurs, rubs, gallops or clicks. No pedal edema. Gastrointestinal system: Abdomen is nondistended, soft and nontender. No organomegaly or masses felt. Normal bowel sounds heard. Central nervous system: Alert and oriented. No focal neurological deficits. Extremities: Symmetric 5 x 5 power.  Bilateral pedal edema+ Skin: No rashes, lesions or ulcers Psychiatry: Judgement and insight appear normal. Mood & affect appropriate.     Data Reviewed: I have personally reviewed following labs and imaging studies  CBC: Recent Labs  Lab 09/24/20 1447 09/25/20 0331 09/26/20 0203 09/27/20 0216 09/28/20 0505  WBC 3.1* 3.1* 3.3* 2.8* 2.6*  HGB 10.6* 10.6* 10.7* 10.4* 10.7*  HCT 33.7* 32.9* 34.1* 32.5* 34.0*  MCV 92.3 90.4 90.9 90.3 90.4  PLT 102* 93* 94* 92* 98*   Basic Metabolic Panel: Recent Labs  Lab 09/24/20 1447 09/25/20 0331 09/26/20 0203 09/27/20 0216 09/28/20 0505  NA 144 142 143 143 143  K 3.6 3.7 3.5 3.5 3.7  CL 105 105 107 104 107  CO2 27 24 23 23 24   GLUCOSE 134* 167* 98 109* 93  BUN 81* 79* 83* 85* 89*  CREATININE 4.31* 4.08* 3.88* 3.71* 3.78*  CALCIUM 7.9* 7.8* 7.8* 7.8* 8.0*  MG  --  2.1  --   --   --    GFR: Estimated Creatinine Clearance: 15.3 mL/min (A) (by  C-G formula based on SCr of 3.78 mg/dL (H)). Liver Function Tests: Recent Labs  Lab 09/27/20 0216 09/28/20 0505  AST  --  14*  ALT  --  8  ALKPHOS  --  56  BILITOT  --  0.7  PROT  --  6.9  ALBUMIN 2.8* 2.8*   No results for input(s): LIPASE, AMYLASE in the last 168 hours. No results for input(s): AMMONIA in the last 168 hours. Coagulation Profile: No results for input(s): INR, PROTIME in the last 168 hours. Cardiac Enzymes: No results for input(s): CKTOTAL, CKMB, CKMBINDEX, TROPONINI in the last 168 hours. BNP (last 3 results) No results for input(s): PROBNP in the last 8760 hours. HbA1C: No results for input(s): HGBA1C in the last 72 hours. CBG: No results for input(s): GLUCAP in the last 168 hours. Lipid Profile: No results for input(s): CHOL, HDL, LDLCALC, TRIG, CHOLHDL, LDLDIRECT in the last 72 hours. Thyroid Function Tests: No results for input(s): TSH, T4TOTAL, FREET4, T3FREE, THYROIDAB in the last 72  hours. Anemia Panel: No results for input(s): VITAMINB12, FOLATE, FERRITIN, TIBC, IRON, RETICCTPCT in the last 72 hours. Sepsis Labs: No results for input(s): PROCALCITON, LATICACIDVEN in the last 168 hours.  Recent Results (from the past 240 hour(s))  Resp Panel by RT-PCR (Flu A&B, Covid) Nasopharyngeal Swab     Status: None   Collection Time: 09/24/20  7:31 PM   Specimen: Nasopharyngeal Swab; Nasopharyngeal(NP) swabs in vial transport medium  Result Value Ref Range Status   SARS Coronavirus 2 by RT PCR NEGATIVE NEGATIVE Final    Comment: (NOTE) SARS-CoV-2 target nucleic acids are NOT DETECTED.  The SARS-CoV-2 RNA is generally detectable in upper respiratory specimens during the acute phase of infection. The lowest concentration of SARS-CoV-2 viral copies this assay can detect is 138 copies/mL. A negative result does not preclude SARS-Cov-2 infection and should not be used as the sole basis for treatment or other patient management decisions. A negative result may  occur with  improper specimen collection/handling, submission of specimen other than nasopharyngeal swab, presence of viral mutation(s) within the areas targeted by this assay, and inadequate number of viral copies(<138 copies/mL). A negative result must be combined with clinical observations, patient history, and epidemiological information. The expected result is Negative.  Fact Sheet for Patients:  EntrepreneurPulse.com.au  Fact Sheet for Healthcare Providers:  IncredibleEmployment.be  This test is no t yet approved or cleared by the [place] FDA and  has been authorized for detection and/or diagnosis of SARS-CoV-2 by FDA under an Emergency Use Authorization (EUA). This EUA will remain  in effect (meaning this test can be used) for the duration of the COVID-19 declaration under Section 564(b)(1) of the Act, 21 U.S.C.section 360bbb-3(b)(1), unless the authorization is terminated  or revoked sooner.       Influenza A by PCR NEGATIVE NEGATIVE Final   Influenza B by PCR NEGATIVE NEGATIVE Final    Comment: (NOTE) The Xpert Xpress SARS-CoV-2/FLU/RSV plus assay is intended as an aid in the diagnosis of influenza from Nasopharyngeal swab specimens and should not be used as a sole basis for treatment. Nasal washings and aspirates are unacceptable for Xpert Xpress SARS-CoV-2/FLU/RSV testing.  Fact Sheet for Patients: EntrepreneurPulse.com.au  Fact Sheet for Healthcare Providers: IncredibleEmployment.be  This test is not yet approved or cleared by the [place] FDA and has been authorized for detection and/or diagnosis of SARS-CoV-2 by FDA under an Emergency Use Authorization (EUA). This EUA will remain in effect (meaning this test can be used) for the duration of the COVID-19 declaration under Section 564(b)(1) of the Act, 21 U.S.C. section 360bbb-3(b)(1), unless the authorization is terminated  or revoked.  Performed at [facility], [place] [address], [place], [place] [address]   MRSA PCR Screening     Status: Abnormal   Collection Time: 09/26/20 10:14 AM   Specimen: Nasopharyngeal  Result Value Ref Range Status   MRSA by PCR POSITIVE (A) NEGATIVE Final    Comment:        The GeneXpert MRSA Assay (FDA approved for NASAL specimens only), is one component of a comprehensive MRSA colonization surveillance program. It is not intended to diagnose MRSA infection nor to guide or monitor treatment for MRSA infections. RESULT CALLED TO, READ BACK BY AND VERIFIED WITH: F,VEATTH @1317  09/26/20 EB Performed at [facility], [place] [address], [place], [place] [address]      Radiology Studies: No results found.  Scheduled Meds: . carvedilol  25 mg Oral BID WC  . Chlorhexidine  Gluconate Cloth  6 each Topical [id number]  . ferrous sulfate  325 mg Oral BID WC  . furosemide  80 mg Intravenous BID  . hydrALAZINE  50 mg Oral Q8H  . hydrocortisone cream   Topical BID  . isosorbide mononitrate  30 mg Oral Daily  . mupirocin ointment  1 application Nasal BID  . nutrition supplement (JUVEN)  1 packet Oral BID BM  . potassium chloride  10 mEq Oral BID  . sodium chloride flush  3 mL Intravenous Q12H   Continuous Infusions:   LOS: 3 days    Time spent: 35 mins    Taylormarie Register, MD [facility]   If 7PM-7AM, please contact night-coverage

## 2020-09-29 DIAGNOSIS — I5043 Acute on chronic combined systolic (congestive) and diastolic (congestive) heart failure: Secondary | ICD-10-CM | POA: Diagnosis not present

## 2020-09-29 LAB — CBC
HCT: 33.6 % — ABNORMAL LOW (ref 36.0–46.0)
Hemoglobin: 10.6 g/dL — ABNORMAL LOW (ref 12.0–15.0)
MCH: 28.7 pg (ref 26.0–34.0)
MCHC: 31.5 g/dL (ref 30.0–36.0)
MCV: 91.1 fL (ref 80.0–100.0)
Platelets: 98 10*3/uL — ABNORMAL LOW (ref 150–400)
RBC: 3.69 MIL/uL — ABNORMAL LOW (ref 3.87–5.11)
RDW: 17.5 % — ABNORMAL HIGH (ref 11.5–15.5)
WBC: 2.4 10*3/uL — ABNORMAL LOW (ref 4.0–10.5)
nRBC: 2.1 % — ABNORMAL HIGH (ref 0.0–0.2)

## 2020-09-29 LAB — BASIC METABOLIC PANEL
Anion gap: 10 (ref 5–15)
BUN: 94 mg/dL — ABNORMAL HIGH (ref 8–23)
CO2: 27 mmol/L (ref 22–32)
Calcium: 8 mg/dL — ABNORMAL LOW (ref 8.9–10.3)
Chloride: 106 mmol/L (ref 98–111)
Creatinine, Ser: 3.76 mg/dL — ABNORMAL HIGH (ref 0.44–1.00)
GFR, Estimated: 13 mL/min — ABNORMAL LOW (ref >60)
Glucose, Bld: 110 mg/dL — ABNORMAL HIGH (ref 70–99)
Potassium: 3.5 mmol/L (ref 3.5–5.1)
Sodium: 143 mmol/L (ref 135–145)

## 2020-09-29 LAB — MAGNESIUM: Magnesium: 2.1 mg/dL (ref 1.7–2.4)

## 2020-09-29 LAB — PHOSPHORUS: Phosphorus: 5.7 mg/dL — ABNORMAL HIGH (ref 2.5–4.6)

## 2020-09-29 NOTE — Progress Notes (Signed)
PROGRESS NOTE    Vanessa [name]  HGD:[id number] DOB: May 29, 1956 DOA: 09/24/2020 PCP: Patient, No Pcp Per   Brief Narrative: 65 years old female with PMH significant for chronic combined systolic and diastolic CHF(EF 35 to 96% by TTE 08/08/20), atrial fibrillation not on anticoagulation due to chronic bleeding from endometrial cancer, CKD stage IV, type II DM, hypertension, anemia of chronic disease, history of MSSA bacteremia, stage I endometrial cancer status post palliative radiation presented to the ED from SNF with shortness of breath and peripheral edema. Patient recently admitted from 08/07/2020-08/18/2020 for acute on chronic combined systolic and diastolic CHF exacerbation, AKI on CKD stage IV, and new onset A. fib with RVR.  Patient was managed with IV diuresis before transition to oral torsemide. A. fib/flutter was controlled with Coreg.  Anticoagulation not started due to high risk of bleeding.  Patient was ultimately discharged to SNF with palliative care. Patient is admitted for acute on chronic CHF exacerbation,  started on diuresis.  Assessment & Plan:   Principal Problem:   Acute on chronic combined systolic (congestive) and diastolic (congestive) heart failure (HCC) Active Problems:   HTN (hypertension)   Thrombocytopenia (HCC)   Acute kidney injury superimposed on CKD (HCC)   Acute on chronic combined systolic (congestive) and diastolic (congestive) heart failure ([place]) - She presented with progressive peripheral edema, orthopnea, DOE, BNP 1724. - 2D echo 07/2020 had shown EF of 35 to 40%, -Continued IV Lasix 40 mg twice daily, received Lasix 60 mg IV x1 in ED -Continue strict I's and O's and daily weights -Continue Coreg, not on ACE/ARB/spironolactone due to renal dysfunction 09/26/2020: CHF symptoms seem to be improving. 09/27/2020: Increased the dose of IV Lasix from 40 mg twice daily to 80 mg twice daily.    Continue to monitor renal function and electrolytes.  Strict I's  and O's.  Cardiac diet.  Avoid excessive sodium intake.     Active problems Acute kidney injury superimposed on CKD stage IV -Likely worsened due to acute on chronic CHF, baseline creatinine 3.6-3.7 -Presented with creatinine of 4.3, continue diuresis and monitor renal function. -Creatinine improving, 3.78 today 09/28/2020: Renal function may be close to baseline.   Continue to monitor renal function, electrolytes, I's and O's.  Patient will need to follow with nephrology on discharge.   Paroxysmal atrial fibrillation -Continue Coreg 25 mg twice daily, HR slightly elevated in low 100's, in atrial fibrillation -Not on anticoagulation due to high risk of bleeding.   Essential hypertension -BP stable, continue Coreg, hydralazine, Imdur. -Continue IV Lasix   Thrombocytopenia -Unclear etiology, mild,  no active bleeding, monitor counts.   History of endometrial cancer with chronic abnormal uterine bleeding Anemia due to chronic blood loss and CKD stage IV -Baseline ~9 Currently stable, 10.6     Obesity Estimated body mass index is 44.17 kg/m as calculated from the following:   Height as of this encounter: 4\' 11"  (1.499 m).   Weight as of this encounter: 99.2 kg.   DVT prophylaxis: SCDs Code Status:DNR Family Communication: No family at bed side. Disposition Plan:   Status is: Inpatient  Remains inpatient appropriate because:Inpatient level of care appropriate due to severity of illness   Dispo: The patient is from: Home              Anticipated d/c is to: SNF              Patient currently is not medically stable to d/c.   Difficult to place patient  No  Consultants:    None  Procedures:  Antimicrobials:  Anti-infectives (From admission, onward)    None       Subjective: Patient was seen and examined at bedside.  She reports feeling much improved,  denies any chest pain or shortness of breath.  She does have significant bilateral lower extremity swelling.  She  was sitting on the chair comfortably.  Objective: Vitals:   09/29/20 0559 09/29/20 0848 09/29/20 1325 09/29/20 1627  BP:  120/85 101/77 (!) 129/102  Pulse: (!) 107 (!) 107 (!) 105   Resp: (!) 21 18 14    Temp:  (!) 97.2 F (36.2 C)    TempSrc:  Oral Oral   SpO2: 100% 94% 98%   Weight: 103.4 kg     Height:        Intake/Output Summary (Last 24 hours) at 09/29/2020 1716 Last data filed at 09/29/2020 0601 Gross per 24 hour  Intake 240 ml  Output 200 ml  Net 40 ml   Filed Weights   09/27/20 0600 09/28/20 0428 09/29/20 0559  Weight: 99.2 kg 100.7 kg 103.4 kg    Examination:  General exam: Appears calm and comfortable , not in any acute distress Respiratory system: Clear to auscultation. Respiratory effort normal. Cardiovascular system: S1 & S2 heard, RRR. No JVD, murmurs, rubs, gallops or clicks. No pedal edema. Gastrointestinal system: Abdomen is nondistended, soft and nontender. No organomegaly or masses felt. Normal bowel sounds heard. Central nervous system: Alert and oriented. No focal neurological deficits. Extremities: Symmetric 5 x 5 power.  Bilateral pedal edema+ Skin: No rashes, lesions or ulcers Psychiatry: Judgement and insight appear normal. Mood & affect appropriate.     Data Reviewed: I have personally reviewed following labs and imaging studies  CBC: Recent Labs  Lab 09/25/20 0331 09/26/20 0203 09/27/20 0216 09/28/20 0505 09/29/20 0234  WBC 3.1* 3.3* 2.8* 2.6* 2.4*  HGB 10.6* 10.7* 10.4* 10.7* 10.6*  HCT 32.9* 34.1* 32.5* 34.0* 33.6*  MCV 90.4 90.9 90.3 90.4 91.1  PLT 93* 94* 92* 98* 98*   Basic Metabolic Panel: Recent Labs  Lab 09/25/20 0331 09/26/20 0203 09/27/20 0216 09/28/20 0505 09/29/20 0234  NA 142 143 143 143 143  K 3.7 3.5 3.5 3.7 3.5  CL 105 107 104 107 106  CO2 24 23 23 24 27   GLUCOSE 167* 98 109* 93 110*  BUN 79* 83* 85* 89* 94*  CREATININE 4.08* 3.88* 3.71* 3.78* 3.76*  CALCIUM 7.8* 7.8* 7.8* 8.0* 8.0*  MG 2.1  --   --   --   2.1  PHOS  --   --   --   --  5.7*   GFR: Estimated Creatinine Clearance: 15.6 mL/min (A) (by C-G formula based on SCr of 3.76 mg/dL (H)). Liver Function Tests: Recent Labs  Lab 09/27/20 0216 09/28/20 0505  AST  --  14*  ALT  --  8  ALKPHOS  --  56  BILITOT  --  0.7  PROT  --  6.9  ALBUMIN 2.8* 2.8*   No results for input(s): LIPASE, AMYLASE in the last 168 hours. No results for input(s): AMMONIA in the last 168 hours. Coagulation Profile: No results for input(s): INR, PROTIME in the last 168 hours. Cardiac Enzymes: No results for input(s): CKTOTAL, CKMB, CKMBINDEX, TROPONINI in the last 168 hours. BNP (last 3 results) No results for input(s): PROBNP in the last 8760 hours. HbA1C: No results for input(s): HGBA1C in the last 72 hours. CBG: No results for  input(s): GLUCAP in the last 168 hours. Lipid Profile: No results for input(s): CHOL, HDL, LDLCALC, TRIG, CHOLHDL, LDLDIRECT in the last 72 hours. Thyroid Function Tests: No results for input(s): TSH, T4TOTAL, FREET4, T3FREE, THYROIDAB in the last 72 hours. Anemia Panel: No results for input(s): VITAMINB12, FOLATE, FERRITIN, TIBC, IRON, RETICCTPCT in the last 72 hours. Sepsis Labs: No results for input(s): PROCALCITON, LATICACIDVEN in the last 168 hours.  Recent Results (from the past 240 hour(s))  Resp Panel by RT-PCR (Flu A&B, Covid) Nasopharyngeal Swab     Status: None   Collection Time: 09/24/20  7:31 PM   Specimen: Nasopharyngeal Swab; Nasopharyngeal(NP) swabs in vial transport medium  Result Value Ref Range Status   SARS Coronavirus 2 by RT PCR NEGATIVE NEGATIVE Final    Comment: (NOTE) SARS-CoV-2 target nucleic acids are NOT DETECTED.  The SARS-CoV-2 RNA is generally detectable in upper respiratory specimens during the acute phase of infection. The lowest concentration of SARS-CoV-2 viral copies this assay can detect is 138 copies/mL. A negative result does not preclude SARS-Cov-2 infection and should not be  used as the sole basis for treatment or other patient management decisions. A negative result may occur with  improper specimen collection/handling, submission of specimen other than nasopharyngeal swab, presence of viral mutation(s) within the areas targeted by this assay, and inadequate number of viral copies(<138 copies/mL). A negative result must be combined with clinical observations, patient history, and epidemiological information. The expected result is Negative.  Fact Sheet for Patients:  EntrepreneurPulse.com.au  Fact Sheet for Healthcare Providers:  IncredibleEmployment.be  This test is no t yet approved or cleared by the [place] FDA and  has been authorized for detection and/or diagnosis of SARS-CoV-2 by FDA under an Emergency Use Authorization (EUA). This EUA will remain  in effect (meaning this test can be used) for the duration of the COVID-19 declaration under Section 564(b)(1) of the Act, 21 U.S.C.section 360bbb-3(b)(1), unless the authorization is terminated  or revoked sooner.       Influenza A by PCR NEGATIVE NEGATIVE Final   Influenza B by PCR NEGATIVE NEGATIVE Final    Comment: (NOTE) The Xpert Xpress SARS-CoV-2/FLU/RSV plus assay is intended as an aid in the diagnosis of influenza from Nasopharyngeal swab specimens and should not be used as a sole basis for treatment. Nasal washings and aspirates are unacceptable for Xpert Xpress SARS-CoV-2/FLU/RSV testing.  Fact Sheet for Patients: EntrepreneurPulse.com.au  Fact Sheet for Healthcare Providers: IncredibleEmployment.be  This test is not yet approved or cleared by the [place] FDA and has been authorized for detection and/or diagnosis of SARS-CoV-2 by FDA under an Emergency Use Authorization (EUA). This EUA will remain in effect (meaning this test can be used) for the duration of the COVID-19 declaration under Section  564(b)(1) of the Act, 21 U.S.C. section 360bbb-3(b)(1), unless the authorization is terminated or revoked.  Performed at [facility], [place] [address] [place], [place] [address]   MRSA PCR Screening     Status: Abnormal   Collection Time: 09/26/20 10:14 AM   Specimen: Nasopharyngeal  Result Value Ref Range Status   MRSA by PCR POSITIVE (A) NEGATIVE Final    Comment:        The GeneXpert MRSA Assay (FDA approved for NASAL specimens only), is one component of a comprehensive MRSA colonization surveillance program. It is not intended to diagnose MRSA infection nor to guide or monitor treatment for MRSA infections. RESULT CALLED TO, READ BACK BY AND VERIFIED WITH: F,VEATTH @  1317 09/26/20 EB Performed at [facility] [address] [place], [place] [address]      Radiology Studies: No results found.  Scheduled Meds: . carvedilol  25 mg Oral BID WC  . Chlorhexidine Gluconate Cloth  6 each Topical Q0600  . ferrous sulfate  325 mg Oral BID WC  . furosemide  80 mg Intravenous BID  . hydrALAZINE  50 mg Oral Q8H  . hydrocortisone cream   Topical BID  . isosorbide mononitrate  30 mg Oral Daily  . mupirocin ointment  1 application Nasal BID  . nutrition supplement (JUVEN)  1 packet Oral BID BM  . potassium chloride  10 mEq Oral BID  . sodium chloride flush  3 mL Intravenous Q12H   Continuous Infusions:    LOS: 4 days    Time spent: 25 mins    Louanna Vanliew, MD [facility]   If 7PM-7AM, please contact night-coverage

## 2020-09-29 NOTE — Progress Notes (Signed)
Physical Therapy Treatment Patient Details Name: Vanessa [name] MRN: [id number] DOB: 1956/06/12 Today's Date: 09/29/2020    History of Present Illness Patient is a 65 y/o female who presents with SOB, weakness, edema and dizziness. Admitted with volume overload secondary to heart failure and CKD. Recent admission 1/28-2/8 for CHF exacerbation, AKI and new A-fib with RVR. PMH includes endometrial carcinoma 2021, COVID-19 PNA 2021, MSSA baceteremia, essential HTN, DM2, CKD, chronic diastolic HF.    PT Comments    Patient progressing slowly towards PT goals. Reports not feeling as great today and having a sore bottom. Less assist needed for bed mobility and standing today from EOB and chair. Tolerated gait training with Min guard assist and use of RW for support. Pt continues to report fatigue and noted to have decreased activity tolerance but motivated to participate in therapy. Pt agreeable to SNF to maximize independence and mobility. Will continue to follow.    Follow Up Recommendations  SNF (vs HHPT pending improvement and daughter;s ability to care for pt)     Equipment Recommendations  Hospital bed;3in1 (PT);Other (comment) (Victor aide if home)    Recommendations for Other Services       Precautions / Restrictions Precautions Precautions: Fall;Other (comment) Precaution Comments: incontinent of stool with walking at times; sore bottom due to hemorrhoids Restrictions Weight Bearing Restrictions: No    Mobility  Bed Mobility Overal bed mobility: Needs Assistance Bed Mobility: Rolling;Sidelying to Sit Rolling: Min guard Sidelying to sit: Min assist;HOB elevated       General bed mobility comments: Min A with LE to get to EOB, heavy use of rail for assist. Discomfort sitting on bottom.    Transfers Overall transfer level: Needs assistance Equipment used: Rolling walker (2 wheeled) Transfers: Sit to/from Stand Sit to Stand: Min guard         General transfer comment:  Min guard for safety. Stood from [employer], from chair x1, transferred to chair post ambulation.  Ambulation/Gait Ambulation/Gait assistance: Min guard Gait Distance (Feet): 28 Feet (+32') Assistive device: Rolling walker (2 wheeled) Gait Pattern/deviations: Step-through pattern;Decreased stride length;Shuffle;Wide base of support;Trunk flexed Gait velocity: decreased   General Gait Details: Slow, mostly steady gait with use of RW; 1 seated rest break. Cues for upright.   Stairs             Wheelchair Mobility    Modified Rankin (Stroke Patients Only)       Balance Overall balance assessment: Needs assistance Sitting-balance support: Feet supported;No upper extremity supported Sitting balance-Leahy Scale: Fair Sitting balance - Comments: Fatigues without back support sitting in chair.   Standing balance support: During functional activity Standing balance-Leahy Scale: Poor Standing balance comment: Requires UE support for standing. Able to stand for a few minutes for pericare and applying medicine to bottom.                            Cognition Arousal/Alertness: Awake/alert Behavior During Therapy: WFL for tasks assessed/performed Overall Cognitive Status: Within Functional Limits for tasks assessed                                        Exercises      General Comments        Pertinent Vitals/Pain Pain Assessment: Faces Faces Pain Scale: Hurts even more Pain Location: bottom due to hemorrhoids Pain Descriptors /  Indicators: Burning;Discomfort;Sore Pain Intervention(s): Monitored during session;Repositioned    Home Living                      Prior Function            PT Goals (current goals can now be found in the care plan section) Progress towards PT goals: Progressing toward goals    Frequency    Min 3X/week      PT Plan Current plan remains appropriate    Co-evaluation              AM-PAC PT  "6 Clicks" Mobility   Outcome Measure  Help needed turning from your back to your side while in a flat bed without using bedrails?: A Little Help needed moving from lying on your back to sitting on the side of a flat bed without using bedrails?: A Little Help needed moving to and from a bed to a chair (including a wheelchair)?: A Little Help needed standing up from a chair using your arms (e.g., wheelchair or bedside chair)?: A Little Help needed to walk in hospital room?: A Little Help needed climbing 3-5 steps with a railing? : A Lot 6 Click Score: 17    End of Session Equipment Utilized During Treatment: Gait belt Activity Tolerance: Patient limited by fatigue;Patient limited by pain Patient left: in chair;with call bell/phone within reach;with chair alarm set Nurse Communication: Mobility status PT Visit Diagnosis: Unsteadiness on feet (R26.81);Muscle weakness (generalized) (M62.81);Difficulty in walking, not elsewhere classified (R26.2);Pain Pain - part of body:  (bottom)     Time: 3546-5681 PT Time Calculation (min) (ACUTE ONLY): 26 min  Charges:  $Gait Training: 8-22 mins $Therapeutic Activity: 8-22 mins                     Marisa Severin, PT, DPT Acute Rehabilitation Services Pager [phone] Office [place] 09/29/2020, 12:22 PM

## 2020-09-29 NOTE — Plan of Care (Signed)

## 2020-09-30 DIAGNOSIS — I5043 Acute on chronic combined systolic (congestive) and diastolic (congestive) heart failure: Secondary | ICD-10-CM | POA: Diagnosis not present

## 2020-09-30 LAB — BASIC METABOLIC PANEL
Anion gap: 12 (ref 5–15)
BUN: 98 mg/dL — ABNORMAL HIGH (ref 8–23)
CO2: 24 mmol/L (ref 22–32)
Calcium: 8.2 mg/dL — ABNORMAL LOW (ref 8.9–10.3)
Chloride: 106 mmol/L (ref 98–111)
Creatinine, Ser: 3.77 mg/dL — ABNORMAL HIGH (ref 0.44–1.00)
GFR, Estimated: 13 mL/min — ABNORMAL LOW (ref >60)
Glucose, Bld: 138 mg/dL — ABNORMAL HIGH (ref 70–99)
Potassium: 3.8 mmol/L (ref 3.5–5.1)
Sodium: 142 mmol/L (ref 135–145)

## 2020-09-30 LAB — CBC
HCT: 33.3 % — ABNORMAL LOW (ref 36.0–46.0)
Hemoglobin: 10.8 g/dL — ABNORMAL LOW (ref 12.0–15.0)
MCH: 29.2 pg (ref 26.0–34.0)
MCHC: 32.4 g/dL (ref 30.0–36.0)
MCV: 90 fL (ref 80.0–100.0)
Platelets: 103 10*3/uL — ABNORMAL LOW (ref 150–400)
RBC: 3.7 MIL/uL — ABNORMAL LOW (ref 3.87–5.11)
RDW: 17.5 % — ABNORMAL HIGH (ref 11.5–15.5)
WBC: 4 10*3/uL (ref 4.0–10.5)
nRBC: 1.8 % — ABNORMAL HIGH (ref 0.0–0.2)

## 2020-09-30 NOTE — Plan of Care (Signed)
  Problem: Elimination: Goal: Will not experience complications related to bowel motility Outcome: Progressing Goal: Will not experience complications related to urinary retention Outcome: Progressing   

## 2020-09-30 NOTE — Progress Notes (Signed)
PROGRESS NOTE    Vanessa [name]  JSE:[id number] DOB: 10-08-1955 DOA: 09/24/2020 PCP: Patient, No Pcp Per   Brief Narrative: 65 years old female with PMH significant for chronic combined systolic and diastolic CHF(EF 35 to 07% by TTE 08/08/20), atrial fibrillation not on anticoagulation due to chronic bleeding from endometrial cancer, CKD stage IV, type II DM, hypertension, anemia of chronic disease, history of MSSA bacteremia, stage I endometrial cancer status post palliative radiation presented to the ED from SNF with shortness of breath and peripheral edema. Patient recently admitted from 08/07/2020-08/18/2020 for acute on chronic combined systolic and diastolic CHF exacerbation, AKI on CKD stage IV, and new onset A. fib with RVR.  Patient was managed with IV diuresis before transition to oral torsemide. A. fib/flutter was controlled with Coreg.  Anticoagulation not started due to high risk of bleeding.  Patient was ultimately discharged to SNF with palliative care. Patient is admitted for acute on chronic CHF exacerbation,  started on diuresis.  Assessment & Plan:   Principal Problem:   Acute on chronic combined systolic (congestive) and diastolic (congestive) heart failure (HCC) Active Problems:   HTN (hypertension)   Thrombocytopenia (HCC)   Acute kidney injury superimposed on CKD (HCC)   Acute on chronic combined systolic (congestive) and diastolic (congestive) heart failure ([place]) - She presented with progressive peripheral edema, orthopnea, DOE, BNP 1724. - 2D echo 07/2020 had shown EF of 35 to 40%, -Continued IV Lasix 40 mg twice daily, received Lasix 60 mg IV x1 in ED -Continue strict I's and O's and daily weights -Continue Coreg, not on ACE/ARB/spironolactone due to renal dysfunction 09/26/2020: CHF symptoms seem to be improving. 09/27/2020: Increased the dose of IV Lasix from 40 mg twice daily to 80 mg twice daily.    Continue to monitor renal function and electrolytes.  Strict I's  and O's.  Cardiac diet.  Avoid excessive sodium intake.     Active problems Acute kidney injury superimposed on CKD stage IV -Likely worsened due to acute on chronic CHF, baseline creatinine 3.6-3.7 -Presented with creatinine of 4.3, continue diuresis and monitor renal function. -Creatinine improving, 3.77 today 09/28/2020: Renal function may be close to baseline.   Continue to monitor renal function, electrolytes, I's and O's.  Patient will need to follow with nephrology on discharge.   Paroxysmal atrial fibrillation -Continue Coreg 25 mg twice daily, HR slightly elevated in low 100's, in atrial fibrillation -Not on anticoagulation due to high risk of bleeding.   Essential hypertension -BP stable, continue Coreg, hydralazine, Imdur. -Continue IV Lasix   Thrombocytopenia -Unclear etiology, mild,  no active bleeding, monitor counts.   History of endometrial cancer with chronic abnormal uterine bleeding Anemia due to chronic blood loss and CKD stage IV -Baseline ~9 Currently stable, 10.6- 10.8     Obesity Estimated body mass index is 44.17 kg/m as calculated from the following:   Height as of this encounter: 4\' 11"  (1.499 m).   Weight as of this encounter: 99.2 kg.   DVT prophylaxis: SCDs Code Status:DNR Family Communication: No family at bed side. Disposition Plan:   Status is: Inpatient  Remains inpatient appropriate because:Inpatient level of care appropriate due to severity of illness   Dispo: The patient is from: Home              Anticipated d/c is to: SNF              Patient currently is not medically stable to d/c.   Difficult to place  patient No  Consultants:    None  Procedures:  Antimicrobials:  Anti-infectives (From admission, onward)   None      Subjective: Patient was seen and examined at bedside.  Overnight events noted.  She reports not having good night sleep.  denies any chest pain or shortness of breath. She does have significant  bilateral lower extremity swelling.    Objective: Vitals:   09/30/20 0206 09/30/20 0424 09/30/20 0800 09/30/20 1348  BP:  (!) 144/98 (!) 120/93 (!) 118/92  Pulse:  (!) 109 (!) 106   Resp:  16 14   Temp:  (!) 97.2 F (36.2 C) (!) 97.5 F (36.4 C)   TempSrc:  Oral Oral   SpO2:  100% 100%   Weight: 101.3 kg 101.3 kg    Height:        Intake/Output Summary (Last 24 hours) at 09/30/2020 1630 Last data filed at 09/30/2020 0900 Gross per 24 hour  Intake 200 ml  Output 300 ml  Net -100 ml   Filed Weights   09/29/20 0559 09/30/20 0206 09/30/20 0424  Weight: 103.4 kg 101.3 kg 101.3 kg    Examination:  General exam: Appears calm and comfortable , not in any acute distress Respiratory system: Clear to auscultation. Respiratory effort normal. Cardiovascular system: S1 & S2 heard, RRR. No JVD, murmurs, rubs, gallops or clicks. No pedal edema. Gastrointestinal system: Abdomen is nondistended, soft and nontender. No organomegaly or masses felt. Normal bowel sounds heard. Central nervous system: Alert and oriented. No focal neurological deficits. Extremities: Symmetric 5 x 5 power.  Bilateral pedal edema+ Skin: No rashes, lesions or ulcers Psychiatry: Judgement and insight appear normal. Mood & affect appropriate.     Data Reviewed: I have personally reviewed following labs and imaging studies  CBC: Recent Labs  Lab 09/26/20 0203 09/27/20 0216 09/28/20 0505 09/29/20 0234 09/30/20 0300  WBC 3.3* 2.8* 2.6* 2.4* 4.0  HGB 10.7* 10.4* 10.7* 10.6* 10.8*  HCT 34.1* 32.5* 34.0* 33.6* 33.3*  MCV 90.9 90.3 90.4 91.1 90.0  PLT 94* 92* 98* 98* 211*   Basic Metabolic Panel: Recent Labs  Lab 09/25/20 0331 09/26/20 0203 09/27/20 0216 09/28/20 0505 09/29/20 0234 09/30/20 0300  NA 142 143 143 143 143 142  K 3.7 3.5 3.5 3.7 3.5 3.8  CL 105 107 104 107 106 106  CO2 24 23 23 24 27 24   GLUCOSE 167* 98 109* 93 110* 138*  BUN 79* 83* 85* 89* 94* 98*  CREATININE 4.08* 3.88* 3.71* 3.78*  3.76* 3.77*  CALCIUM 7.8* 7.8* 7.8* 8.0* 8.0* 8.2*  MG 2.1  --   --   --  2.1  --   PHOS  --   --   --   --  5.7*  --    GFR: Estimated Creatinine Clearance: 15.4 mL/min (A) (by C-G formula based on SCr of 3.77 mg/dL (H)). Liver Function Tests: Recent Labs  Lab 09/27/20 0216 09/28/20 0505  AST  --  14*  ALT  --  8  ALKPHOS  --  56  BILITOT  --  0.7  PROT  --  6.9  ALBUMIN 2.8* 2.8*   No results for input(s): LIPASE, AMYLASE in the last 168 hours. No results for input(s): AMMONIA in the last 168 hours. Coagulation Profile: No results for input(s): INR, PROTIME in the last 168 hours. Cardiac Enzymes: No results for input(s): CKTOTAL, CKMB, CKMBINDEX, TROPONINI in the last 168 hours. BNP (last 3 results) No results for input(s): PROBNP in  the last 8760 hours. HbA1C: No results for input(s): HGBA1C in the last 72 hours. CBG: No results for input(s): GLUCAP in the last 168 hours. Lipid Profile: No results for input(s): CHOL, HDL, LDLCALC, TRIG, CHOLHDL, LDLDIRECT in the last 72 hours. Thyroid Function Tests: No results for input(s): TSH, T4TOTAL, FREET4, T3FREE, THYROIDAB in the last 72 hours. Anemia Panel: No results for input(s): VITAMINB12, FOLATE, FERRITIN, TIBC, IRON, RETICCTPCT in the last 72 hours. Sepsis Labs: No results for input(s): PROCALCITON, LATICACIDVEN in the last 168 hours.  Recent Results (from the past 240 hour(s))  Resp Panel by RT-PCR (Flu A&B, Covid) Nasopharyngeal Swab     Status: None   Collection Time: 09/24/20  7:31 PM   Specimen: Nasopharyngeal Swab; Nasopharyngeal(NP) swabs in vial transport medium  Result Value Ref Range Status   SARS Coronavirus 2 by RT PCR NEGATIVE NEGATIVE Final    Comment: (NOTE) SARS-CoV-2 target nucleic acids are NOT DETECTED.  The SARS-CoV-2 RNA is generally detectable in upper respiratory specimens during the acute phase of infection. The lowest concentration of SARS-CoV-2 viral copies this assay can detect is 138  copies/mL. A negative result does not preclude SARS-Cov-2 infection and should not be used as the sole basis for treatment or other patient management decisions. A negative result may occur with  improper specimen collection/handling, submission of specimen other than nasopharyngeal swab, presence of viral mutation(s) within the areas targeted by this assay, and inadequate number of viral copies(<138 copies/mL). A negative result must be combined with clinical observations, patient history, and epidemiological information. The expected result is Negative.  Fact Sheet for Patients:  EntrepreneurPulse.com.au  Fact Sheet for Healthcare Providers:  IncredibleEmployment.be  This test is no t yet approved or cleared by the [place] FDA and  has been authorized for detection and/or diagnosis of SARS-CoV-2 by FDA under an Emergency Use Authorization (EUA). This EUA will remain  in effect (meaning this test can be used) for the duration of the COVID-19 declaration under Section 564(b)(1) of the Act, 21 U.S.C.section 360bbb-3(b)(1), unless the authorization is terminated  or revoked sooner.       Influenza A by PCR NEGATIVE NEGATIVE Final   Influenza B by PCR NEGATIVE NEGATIVE Final    Comment: (NOTE) The Xpert Xpress SARS-CoV-2/FLU/RSV plus assay is intended as an aid in the diagnosis of influenza from Nasopharyngeal swab specimens and should not be used as a sole basis for treatment. Nasal washings and aspirates are unacceptable for Xpert Xpress SARS-CoV-2/FLU/RSV testing.  Fact Sheet for Patients: EntrepreneurPulse.com.au  Fact Sheet for Healthcare Providers: IncredibleEmployment.be  This test is not yet approved or cleared by the [place] FDA and has been authorized for detection and/or diagnosis of SARS-CoV-2 by FDA under an Emergency Use Authorization (EUA). This EUA will remain in effect (meaning  this test can be used) for the duration of the COVID-19 declaration under Section 564(b)(1) of the Act, 21 U.S.C. section 360bbb-3(b)(1), unless the authorization is terminated or revoked.  Performed at [facility], [place] [address], [place], [place] [address]   MRSA PCR Screening     Status: Abnormal   Collection Time: 09/26/20 10:14 AM   Specimen: Nasopharyngeal  Result Value Ref Range Status   MRSA by PCR POSITIVE (A) NEGATIVE Final    Comment:        The GeneXpert MRSA Assay (FDA approved for NASAL specimens only), is one component of a comprehensive MRSA colonization surveillance program. It is not intended to diagnose MRSA infection  nor to guide or monitor treatment for MRSA infections. RESULT CALLED TO, READ BACK BY AND VERIFIED WITH: F,VEATTH @1317  09/26/20 EB Performed at [facility], [place] [address], [place], [place] [address]      Radiology Studies: No results found.  Scheduled Meds: . carvedilol  25 mg Oral BID WC  . Chlorhexidine Gluconate Cloth  6 each Topical Q0600  . ferrous sulfate  325 mg Oral BID WC  . furosemide  80 mg Intravenous BID  . hydrALAZINE  50 mg Oral Q8H  . hydrocortisone cream   Topical BID  . isosorbide mononitrate  30 mg Oral Daily  . mupirocin ointment  1 application Nasal BID  . nutrition supplement (JUVEN)  1 packet Oral BID BM  . potassium chloride  10 mEq Oral BID  . sodium chloride flush  3 mL Intravenous Q12H   Continuous Infusions:   LOS: 5 days    Time spent: 25 mins    Sujay Grundman, MD [facility]   If 7PM-7AM, please contact night-coverage

## 2020-10-01 DIAGNOSIS — I5043 Acute on chronic combined systolic (congestive) and diastolic (congestive) heart failure: Secondary | ICD-10-CM | POA: Diagnosis not present

## 2020-10-01 LAB — CBC
HCT: 31.8 % — ABNORMAL LOW (ref 36.0–46.0)
Hemoglobin: 10.4 g/dL — ABNORMAL LOW (ref 12.0–15.0)
MCH: 29.1 pg (ref 26.0–34.0)
MCHC: 32.7 g/dL (ref 30.0–36.0)
MCV: 88.8 fL (ref 80.0–100.0)
Platelets: 97 10*3/uL — ABNORMAL LOW (ref 150–400)
RBC: 3.58 MIL/uL — ABNORMAL LOW (ref 3.87–5.11)
RDW: 17.7 % — ABNORMAL HIGH (ref 11.5–15.5)
WBC: 3.4 10*3/uL — ABNORMAL LOW (ref 4.0–10.5)
nRBC: 2.4 % — ABNORMAL HIGH (ref 0.0–0.2)

## 2020-10-01 LAB — BASIC METABOLIC PANEL
Anion gap: 10 (ref 5–15)
BUN: 100 mg/dL — ABNORMAL HIGH (ref 8–23)
CO2: 25 mmol/L (ref 22–32)
Calcium: 8.2 mg/dL — ABNORMAL LOW (ref 8.9–10.3)
Chloride: 107 mmol/L (ref 98–111)
Creatinine, Ser: 3.81 mg/dL — ABNORMAL HIGH (ref 0.44–1.00)
GFR, Estimated: 12 mL/min — ABNORMAL LOW (ref >60)
Glucose, Bld: 83 mg/dL (ref 70–99)
Potassium: 3.9 mmol/L (ref 3.5–5.1)
Sodium: 142 mmol/L (ref 135–145)

## 2020-10-01 LAB — RESP PANEL BY RT-PCR (FLU A&B, COVID) ARPGX2
Influenza A by PCR: NEGATIVE
Influenza B by PCR: NEGATIVE
SARS Coronavirus 2 by RT PCR: NEGATIVE

## 2020-10-01 NOTE — Care Management Important Message (Signed)
Important Message  Patient Details  Name: Vanessa [name] MRN: [id number] Date of Birth: 25-Oct-1955   Medicare Important Message Given:  Yes     Shelda Altes 10/01/2020, 8:50 AM

## 2020-10-01 NOTE — Progress Notes (Signed)
Occupational Therapy Treatment Patient Details Name: Vanessa [name] MRN: [id number] DOB: May 24, 1956 Today's Date: 10/01/2020    History of present illness Patient is a 65 y/o female who presents with SOB, weakness, edema and dizziness. Admitted with volume overload secondary to heart failure and CKD. Recent admission 1/28-2/8 for CHF exacerbation, AKI and new A-fib with RVR. PMH includes endometrial carcinoma 2021, COVID-19 PNA 2021, MSSA baceteremia, essential HTN, DM2, CKD, chronic diastolic HF.   OT comments  Pt seen for ADL training. Min assist for UB bathing and dressing, total assist for posterior pericare. Pt transferred with min guard assist and RW to chair and completed grooming with set up. Pt continues to fatigue with minimal exertion, with VSS throughout session on RA.   Follow Up Recommendations  SNF;Supervision/Assistance - 24 hour    Equipment Recommendations  Wheelchair (measurements OT);Wheelchair cushion (measurements OT);Hospital bed;3 in 1 bedside commode    Recommendations for Other Services      Precautions / Restrictions Precautions Precautions: Fall       Mobility Bed Mobility Overal bed mobility: Needs Assistance Bed Mobility: Rolling;Sidelying to Sit Rolling: Min guard Sidelying to sit: Min assist       General bed mobility comments: min assist to raise trunk, increased time, HOB up    Transfers Overall transfer level: Needs assistance Equipment used: Rolling walker (2 wheeled) Transfers: Sit to/from Stand Sit to Stand: Min guard              Balance Overall balance assessment: Needs assistance   Sitting balance-Leahy Scale: Fair       Standing balance-Leahy Scale: Poor                             ADL either performed or assessed with clinical judgement   ADL Overall ADL's : Needs assistance/impaired Eating/Feeding: Independent;Sitting   Grooming: Wash/dry hands;Wash/dry face;Brushing hair;Sitting;Set up   Upper  Body Bathing: Minimal assistance;Sitting Upper Body Bathing Details (indicate cue type and reason): assisted with back     Upper Body Dressing : Minimal assistance;Sitting           Toileting- Clothing Manipulation and Hygiene: Total assistance;Sit to/from stand       Functional mobility during ADLs: Min guard;Rolling walker General ADL Comments: fatigues easily     Vision       Perception     Praxis      Cognition Arousal/Alertness: Awake/alert Behavior During Therapy: WFL for tasks assessed/performed Overall Cognitive Status: Within Functional Limits for tasks assessed                                          Exercises     Shoulder Instructions       General Comments      Pertinent Vitals/ Pain       Pain Assessment: No/denies pain  Home Living                                          Prior Functioning/Environment              Frequency  Min 2X/week        Progress Toward Goals  OT Goals(current goals can now be found in the care plan section)  Progress towards OT goals: Progressing toward goals  Acute Rehab OT Goals Patient Stated Goal: to get better and go home with my daughter OT Goal Formulation: With patient Time For Goal Achievement: 10/12/20 Potential to Achieve Goals: Vanessa [name] Discharge plan remains appropriate    Co-evaluation                 AM-PAC OT "6 Clicks" Daily Activity     Outcome Measure   Help from another person eating meals?: None Help from another person taking care of personal grooming?: A Little Help from another person toileting, which includes using toliet, bedpan, or urinal?: A Lot Help from another person bathing (including washing, rinsing, drying)?: A Lot Help from another person to put on and taking off regular upper body clothing?: A Little Help from another person to put on and taking off regular lower body clothing?: A Lot 6 Click Score: 16    End of  Session Equipment Utilized During Treatment: Gait belt;Rolling walker  OT Visit Diagnosis: Unsteadiness on feet (R26.81);Other abnormalities of gait and mobility (R26.89);Muscle weakness (generalized) (M62.81)   Activity Tolerance Patient limited by fatigue;Patient tolerated treatment well   Patient Left in chair;with call bell/phone within reach;with chair alarm set   Nurse Communication          Time: 7517-0017 OT Time Calculation (min): 30 min  Charges: OT General Charges $OT Visit: 1 Visit OT Treatments $Self Care/Home Management : 23-37 mins  Nestor Lewandowsky, OTR/L Acute Rehabilitation Services Pager: [phone] Office: [phone]   Malka So 10/01/2020, 10:55 AM

## 2020-10-01 NOTE — Progress Notes (Signed)
PROGRESS NOTE    Vanessa [name]  FUX:[id number] DOB: 30-Mar-1956 DOA: 09/24/2020 PCP: Patient, No Pcp Per   Brief Narrative: 65 years old female with PMH significant for chronic combined systolic and diastolic CHF(EF 35 to 02% by TTE 08/08/20), atrial fibrillation not on anticoagulation due to chronic bleeding from endometrial cancer, CKD stage IV, type II DM, hypertension, anemia of chronic disease, history of MSSA bacteremia, stage I endometrial cancer status post palliative radiation presented to the ED from SNF with shortness of breath and peripheral edema. Patient recently admitted from 08/07/2020-08/18/2020 for acute on chronic combined systolic and diastolic CHF exacerbation, AKI on CKD stage IV, and new onset A. fib with RVR.  Patient was managed with IV diuresis before transition to oral torsemide. A. fib/flutter was controlled with Coreg.  Anticoagulation not started due to high risk of bleeding.  Patient was ultimately discharged to SNF with palliative care. Patient is admitted for acute on chronic CHF exacerbation,  started on diuresis.  Assessment & Plan:   Principal Problem:   Acute on chronic combined systolic (congestive) and diastolic (congestive) heart failure (HCC) Active Problems:   HTN (hypertension)   Thrombocytopenia (HCC)   Acute kidney injury superimposed on CKD (HCC)   Acute on chronic combined systolic (congestive) and diastolic (congestive) heart failure ([place]) - She presented with progressive peripheral edema, orthopnea, DOE, BNP 1724. - 2D echo 07/2020 had shown EF of 35 to 40%, -Continued IV Lasix 40 mg twice daily, received Lasix 60 mg IV x1 in ED -Continue strict I's and O's and daily weights -Continue Coreg, not on ACE/ARB/spironolactone due to renal dysfunction 09/26/2020: CHF symptoms seem to be improving. 09/27/2020: Increased the dose of IV Lasix from 40 mg twice daily to 80 mg twice daily.    Continue to monitor renal function and electrolytes.  Strict I's  and O's.  Cardiac diet.  Avoid excessive sodium intake.     Active problems Acute kidney injury superimposed on CKD stage IV -Likely worsened due to acute on chronic CHF, baseline creatinine 3.6-3.7 -Presented with creatinine of 4.3, continue diuresis and monitor renal function. -Creatinine improving, 3.81 today 09/28/2020: Renal function may be close to baseline.   Continue to monitor renal function, electrolytes, I's and O's.  Patient will need to follow with nephrology on discharge.   Paroxysmal atrial fibrillation -Continue Coreg 25 mg twice daily, HR slightly elevated in low 100's, in atrial fibrillation -Not on anticoagulation due to high risk of bleeding.   Essential hypertension -BP stable, continue Coreg, hydralazine, Imdur. -Continue IV Lasix   Thrombocytopenia -Unclear etiology, mild,  no active bleeding, monitor counts.   History of endometrial cancer with chronic abnormal uterine bleeding Anemia due to chronic blood loss and CKD stage IV -Baseline ~9 Currently stable, 10.6- 10.8     Obesity Estimated body mass index is 44.17 kg/m as calculated from the following:   Height as of this encounter: 4\' 11"  (1.499 m).   Weight as of this encounter: 99.2 kg.   DVT prophylaxis: SCDs Code Status:DNR Family Communication: No family at bed side. Disposition Plan:   Status is: Inpatient  Remains inpatient appropriate because:Inpatient level of care appropriate due to severity of illness   Dispo: The patient is from: Home              Anticipated d/c is to: SNF 3/25              Patient currently is medically stable for dc   Difficult to place  patient No  Consultants:    None  Procedures:  Antimicrobials:  Anti-infectives (From admission, onward)   None      Subjective: Patient was seen and examined at bedside.  Overnight events noted.  She reports feeling much improved She denies any chest pain or shortness of breath. Leg swelling  improving.  Objective: Vitals:   10/01/20 0551 10/01/20 1022 10/01/20 1324 10/01/20 1343  BP: 111/86  125/79 (!) 126/93  Pulse:  (!) 109 (!) 105   Resp:  14 20   Temp:      TempSrc:      SpO2:  100% 100%   Weight:  100.4 kg    Height:        Intake/Output Summary (Last 24 hours) at 10/01/2020 1543 Last data filed at 10/01/2020 1014 Gross per 24 hour  Intake 203 ml  Output 300 ml  Net -97 ml   Filed Weights   09/30/20 0206 09/30/20 0424 10/01/20 1022  Weight: 101.3 kg 101.3 kg 100.4 kg    Examination:  General exam: Appears calm and comfortable , not in any acute distress Respiratory system: Clear to auscultation. Respiratory effort normal. Cardiovascular system: S1 & S2 heard, RRR. No JVD, murmurs, rubs, gallops or clicks. No pedal edema. Gastrointestinal system: Abdomen is nondistended, soft and nontender. No organomegaly or masses felt. Normal bowel sounds heard. Central nervous system: Alert and oriented. No focal neurological deficits. Extremities: Symmetric 5 x 5 power.  Bilateral pedal edema+ Skin: No rashes, lesions or ulcers Psychiatry: Judgement and insight appear normal. Mood & affect appropriate.     Data Reviewed: I have personally reviewed following labs and imaging studies  CBC: Recent Labs  Lab 09/27/20 0216 09/28/20 0505 09/29/20 0234 09/30/20 0300 10/01/20 0232  WBC 2.8* 2.6* 2.4* 4.0 3.4*  HGB 10.4* 10.7* 10.6* 10.8* 10.4*  HCT 32.5* 34.0* 33.6* 33.3* 31.8*  MCV 90.3 90.4 91.1 90.0 88.8  PLT 92* 98* 98* 103* 97*   Basic Metabolic Panel: Recent Labs  Lab 09/25/20 0331 09/26/20 0203 09/27/20 0216 09/28/20 0505 09/29/20 0234 09/30/20 0300 10/01/20 0232  NA 142   < > 143 143 143 142 142  K 3.7   < > 3.5 3.7 3.5 3.8 3.9  CL 105   < > 104 107 106 106 107  CO2 24   < > 23 24 27 24 25   GLUCOSE 167*   < > 109* 93 110* 138* 83  BUN 79*   < > 85* 89* 94* 98* 100*  CREATININE 4.08*   < > 3.71* 3.78* 3.76* 3.77* 3.81*  CALCIUM 7.8*   < >  7.8* 8.0* 8.0* 8.2* 8.2*  MG 2.1  --   --   --  2.1  --   --   PHOS  --   --   --   --  5.7*  --   --    < > = values in this interval not displayed.   GFR: Estimated Creatinine Clearance: 15.2 mL/min (A) (by C-G formula based on SCr of 3.81 mg/dL (H)). Liver Function Tests: Recent Labs  Lab 09/27/20 0216 09/28/20 0505  AST  --  14*  ALT  --  8  ALKPHOS  --  56  BILITOT  --  0.7  PROT  --  6.9  ALBUMIN 2.8* 2.8*   No results for input(s): LIPASE, AMYLASE in the last 168 hours. No results for input(s): AMMONIA in the last 168 hours. Coagulation Profile: No results for input(s): INR,  PROTIME in the last 168 hours. Cardiac Enzymes: No results for input(s): CKTOTAL, CKMB, CKMBINDEX, TROPONINI in the last 168 hours. BNP (last 3 results) No results for input(s): PROBNP in the last 8760 hours. HbA1C: No results for input(s): HGBA1C in the last 72 hours. CBG: No results for input(s): GLUCAP in the last 168 hours. Lipid Profile: No results for input(s): CHOL, HDL, LDLCALC, TRIG, CHOLHDL, LDLDIRECT in the last 72 hours. Thyroid Function Tests: No results for input(s): TSH, T4TOTAL, FREET4, T3FREE, THYROIDAB in the last 72 hours. Anemia Panel: No results for input(s): VITAMINB12, FOLATE, FERRITIN, TIBC, IRON, RETICCTPCT in the last 72 hours. Sepsis Labs: No results for input(s): PROCALCITON, LATICACIDVEN in the last 168 hours.  Recent Results (from the past 240 hour(s))  Resp Panel by RT-PCR (Flu A&B, Covid) Nasopharyngeal Swab     Status: None   Collection Time: 09/24/20  7:31 PM   Specimen: Nasopharyngeal Swab; Nasopharyngeal(NP) swabs in vial transport medium  Result Value Ref Range Status   SARS Coronavirus 2 by RT PCR NEGATIVE NEGATIVE Final    Comment: (NOTE) SARS-CoV-2 target nucleic acids are NOT DETECTED.  The SARS-CoV-2 RNA is generally detectable in upper respiratory specimens during the acute phase of infection. The lowest concentration of SARS-CoV-2 viral copies  this assay can detect is 138 copies/mL. A negative result does not preclude SARS-Cov-2 infection and should not be used as the sole basis for treatment or other patient management decisions. A negative result may occur with  improper specimen collection/handling, submission of specimen other than nasopharyngeal swab, presence of viral mutation(s) within the areas targeted by this assay, and inadequate number of viral copies(<138 copies/mL). A negative result must be combined with clinical observations, patient history, and epidemiological information. The expected result is Negative.  Fact Sheet for Patients:  EntrepreneurPulse.com.au  Fact Sheet for Healthcare Providers:  IncredibleEmployment.be  This test is no t yet approved or cleared by the [place] FDA and  has been authorized for detection and/or diagnosis of SARS-CoV-2 by FDA under an Emergency Use Authorization (EUA). This EUA will remain  in effect (meaning this test can be used) for the duration of the COVID-19 declaration under Section 564(b)(1) of the Act, 21 U.S.C.section 360bbb-3(b)(1), unless the authorization is terminated  or revoked sooner.       Influenza A by PCR NEGATIVE NEGATIVE Final   Influenza B by PCR NEGATIVE NEGATIVE Final    Comment: (NOTE) The Xpert Xpress SARS-CoV-2/FLU/RSV plus assay is intended as an aid in the diagnosis of influenza from Nasopharyngeal swab specimens and should not be used as a sole basis for treatment. Nasal washings and aspirates are unacceptable for Xpert Xpress SARS-CoV-2/FLU/RSV testing.  Fact Sheet for Patients: EntrepreneurPulse.com.au  Fact Sheet for Healthcare Providers: IncredibleEmployment.be  This test is not yet approved or cleared by the [place] FDA and has been authorized for detection and/or diagnosis of SARS-CoV-2 by FDA under an Emergency Use Authorization (EUA). This EUA  will remain in effect (meaning this test can be used) for the duration of the COVID-19 declaration under Section 564(b)(1) of the Act, 21 U.S.C. section 360bbb-3(b)(1), unless the authorization is terminated or revoked.  Performed at [facility], [place] [address] [place], [place] [address]   MRSA PCR Screening     Status: Abnormal   Collection Time: 09/26/20 10:14 AM   Specimen: Nasopharyngeal  Result Value Ref Range Status   MRSA by PCR POSITIVE (A) NEGATIVE Final    Comment:  The GeneXpert MRSA Assay (FDA approved for NASAL specimens only), is one component of a comprehensive MRSA colonization surveillance program. It is not intended to diagnose MRSA infection nor to guide or monitor treatment for MRSA infections. RESULT CALLED TO, READ BACK BY AND VERIFIED WITH: F,VEATTH @1317  09/26/20 EB Performed at [facility], [place] [address] [place], [place] [address]   Resp Panel by RT-PCR (Flu A&B, Covid) Nasopharyngeal Swab     Status: None   Collection Time: 10/01/20 11:03 AM   Specimen: Nasopharyngeal Swab; Nasopharyngeal(NP) swabs in vial transport medium  Result Value Ref Range Status   SARS Coronavirus 2 by RT PCR NEGATIVE NEGATIVE Final    Comment: (NOTE) SARS-CoV-2 target nucleic acids are NOT DETECTED.  The SARS-CoV-2 RNA is generally detectable in upper respiratory specimens during the acute phase of infection. The lowest concentration of SARS-CoV-2 viral copies this assay can detect is 138 copies/mL. A negative result does not preclude SARS-Cov-2 infection and should not be used as the sole basis for treatment or other patient management decisions. A negative result may occur with  improper specimen collection/handling, submission of specimen other than nasopharyngeal swab, presence of viral mutation(s) within the areas targeted by this assay, and inadequate number of viral copies(<138 copies/mL). A negative result must be combined with clinical  observations, patient history, and epidemiological information. The expected result is Negative.  Fact Sheet for Patients:  EntrepreneurPulse.com.au  Fact Sheet for Healthcare Providers:  IncredibleEmployment.be  This test is no t yet approved or cleared by the [place] FDA and  has been authorized for detection and/or diagnosis of SARS-CoV-2 by FDA under an Emergency Use Authorization (EUA). This EUA will remain  in effect (meaning this test can be used) for the duration of the COVID-19 declaration under Section 564(b)(1) of the Act, 21 U.S.C.section 360bbb-3(b)(1), unless the authorization is terminated  or revoked sooner.       Influenza A by PCR NEGATIVE NEGATIVE Final   Influenza B by PCR NEGATIVE NEGATIVE Final    Comment: (NOTE) The Xpert Xpress SARS-CoV-2/FLU/RSV plus assay is intended as an aid in the diagnosis of influenza from Nasopharyngeal swab specimens and should not be used as a sole basis for treatment. Nasal washings and aspirates are unacceptable for Xpert Xpress SARS-CoV-2/FLU/RSV testing.  Fact Sheet for Patients: EntrepreneurPulse.com.au  Fact Sheet for Healthcare Providers: IncredibleEmployment.be  This test is not yet approved or cleared by the [place] FDA and has been authorized for detection and/or diagnosis of SARS-CoV-2 by FDA under an Emergency Use Authorization (EUA). This EUA will remain in effect (meaning this test can be used) for the duration of the COVID-19 declaration under Section 564(b)(1) of the Act, 21 U.S.C. section 360bbb-3(b)(1), unless the authorization is terminated or revoked.  Performed at [facility], [place] [address], [place], [place] [address]      Radiology Studies: No results found.  Scheduled Meds: . carvedilol  25 mg Oral BID WC  . ferrous sulfate  325 mg Oral BID WC  . furosemide  80 mg Intravenous BID  . hydrALAZINE  50 mg  Oral Q8H  . hydrocortisone cream   Topical BID  . isosorbide mononitrate  30 mg Oral Daily  . nutrition supplement (JUVEN)  1 packet Oral BID BM  . potassium chloride  10 mEq Oral BID  . sodium chloride flush  3 mL Intravenous Q12H   Continuous Infusions:   LOS: 6 days    Time spent: 25 mins  Shawna Clamp, MD [facility]   If 7PM-7AM, please contact night-coverage

## 2020-10-01 NOTE — Progress Notes (Signed)
Physical Therapy Treatment Patient Details Name: Vanessa [name] MRN: [id number] DOB: [birth year] Today's Date: 10/01/2020    History of Present Illness Patient is a 65 y/o female who presents with SOB, weakness, edema and dizziness. Admitted with volume overload secondary to heart failure and CKD. Recent admission 1/28-2/8 for CHF exacerbation, AKI and new A-fib with RVR. PMH includes endometrial carcinoma 2021, COVID-19 PNA 2021, MSSA baceteremia, essential HTN, DM2, CKD, chronic diastolic HF.    PT Comments    Pt limited by fatigue this session, citing muscular fatigue and increased work of breathing. Pt also reports generally unwell and tired after not sleeping well last night, possibly contributing to poor performance. Pt will benefit from continued aggressive mobilization and acute PT services to improve activity tolerance and to restore independence. PT continues to recommend SNF placement at this time.   Follow Up Recommendations  SNF     Equipment Recommendations  3in1 (PT)    Recommendations for Other Services       Precautions / Restrictions Precautions Precautions: Fall Precaution Comments: incontinent of stool with walking at times; sore bottom due to hemorrhoids Restrictions Weight Bearing Restrictions: No    Mobility  Bed Mobility               General bed mobility comments: bed mobility not assessed    Transfers Overall transfer level: Needs assistance Equipment used: Rolling walker (2 wheeled) Transfers: Sit to/from Stand Sit to Stand: Min guard            Ambulation/Gait Ambulation/Gait assistance: Min guard Gait Distance (Feet): 20 Feet (20' x 2) Assistive device: Rolling walker (2 wheeled) Gait Pattern/deviations: Step-to pattern Gait velocity: reduced Gait velocity interpretation: <1.31 ft/sec, indicative of household ambulator General Gait Details: pt with slowed step-to gait, incontinent of stool during ambulation,   Stairs              Wheelchair Mobility    Modified Rankin (Stroke Patients Only)       Balance Overall balance assessment: Needs assistance Sitting-balance support: No upper extremity supported;Feet supported Sitting balance-Leahy Scale: Fair     Standing balance support: Single extremity supported;Bilateral upper extremity supported Standing balance-Leahy Scale: Poor Standing balance comment: reliant on UE support of RW                            Cognition Arousal/Alertness: Awake/alert Behavior During Therapy: WFL for tasks assessed/performed Overall Cognitive Status: Within Functional Limits for tasks assessed                                        Exercises      General Comments General comments (skin integrity, edema, etc.): VSS on RA, pt fatigues quickly      Pertinent Vitals/Pain Pain Assessment: No/denies pain    Home Living                      Prior Function            PT Goals (current goals can now be found in the care plan section) Acute Rehab PT Goals Patient Stated Goal: improve activity tolerance, go to rehab Progress towards PT goals: Progressing toward goals    Frequency    Min 3X/week (maintain 3x for possible progression home)      PT Plan Current plan remains appropriate  Co-evaluation              AM-PAC PT "6 Clicks" Mobility   Outcome Measure  Help needed turning from your back to your side while in a flat bed without using bedrails?: A Little Help needed moving from lying on your back to sitting on the side of a flat bed without using bedrails?: A Little Help needed moving to and from a bed to a chair (including a wheelchair)?: A Little Help needed standing up from a chair using your arms (e.g., wheelchair or bedside chair)?: A Little Help needed to walk in hospital room?: A Little Help needed climbing 3-5 steps with a railing? : A Lot 6 Click Score: 17    End of Session   Activity  Tolerance: Patient limited by fatigue Patient left: in chair;with call bell/phone within reach;with chair alarm set Nurse Communication: Mobility status PT Visit Diagnosis: Unsteadiness on feet (R26.81);Muscle weakness (generalized) (M62.81);Difficulty in walking, not elsewhere classified (R26.2);Pain     Time: 1430-1446 PT Time Calculation (min) (ACUTE ONLY): 16 min  Charges:  $Gait Training: 8-22 mins                     [name], PT, DPT Acute Rehabilitation Pager: [phone]    [name] 10/01/2020, 4:23 PM

## 2020-10-01 NOTE — TOC Progression Note (Signed)
Transition of Care [facility]) - Progression Note    Patient Details  Name: Vanessa [name] MRN: [id number] Date of Birth: 01/18/1956  Transition of Care [facility]) CM/SW [place], [place] Phone Number: 10/01/2020, 1:17 PM  Clinical Narrative:    Insurance auth started, ref number [id number].    Expected Discharge Plan: Skilled Nursing Facility Barriers to Discharge: Continued Medical Work up  Expected Discharge Plan and Services Expected Discharge Plan: [place] In-house Referral: Clinical Social Work     Living arrangements for the past 2 months: [place]                                       Social Determinants of Health (SDOH) Interventions    Readmission Risk Interventions Readmission Risk Prevention Plan 11/08/2019 10/21/2019 09/25/2019  Transportation Screening Complete Complete Complete  PCP or Specialist Appt within 3-5 Days - - Not Complete  Not Complete comments - - plan for SNF  [place] or [place] - - Complete  Social Work Consult for [place] Planning/Counseling - - Complete  Palliative Care Screening - - Not Applicable  Medication Review Press photographer) Complete Referral to Pharmacy Referral to Pharmacy  PCP or Specialist appointment within 3-5 days of discharge Complete Complete -  [place] or Home Care Consult Complete (No Data) -  SW Recovery Care/Counseling Consult Complete Complete -  Palliative Care Screening Not Applicable Not Applicable -  Skilled Nursing Facility Complete Complete -  Some recent data might be hidden

## 2020-10-02 DIAGNOSIS — I10 Essential (primary) hypertension: Secondary | ICD-10-CM | POA: Diagnosis not present

## 2020-10-02 DIAGNOSIS — I5043 Acute on chronic combined systolic (congestive) and diastolic (congestive) heart failure: Secondary | ICD-10-CM | POA: Diagnosis not present

## 2020-10-02 DIAGNOSIS — N189 Chronic kidney disease, unspecified: Secondary | ICD-10-CM | POA: Diagnosis not present

## 2020-10-02 DIAGNOSIS — N179 Acute kidney failure, unspecified: Secondary | ICD-10-CM | POA: Diagnosis not present

## 2020-10-02 LAB — BRAIN NATRIURETIC PEPTIDE: B Natriuretic Peptide: 1216.5 pg/mL — ABNORMAL HIGH (ref 0.0–100.0)

## 2020-10-02 LAB — BASIC METABOLIC PANEL
Anion gap: 13 (ref 5–15)
BUN: 105 mg/dL — ABNORMAL HIGH (ref 8–23)
CO2: 24 mmol/L (ref 22–32)
Calcium: 8.4 mg/dL — ABNORMAL LOW (ref 8.9–10.3)
Chloride: 106 mmol/L (ref 98–111)
Creatinine, Ser: 3.98 mg/dL — ABNORMAL HIGH (ref 0.44–1.00)
GFR, Estimated: 12 mL/min — ABNORMAL LOW (ref >60)
Glucose, Bld: 80 mg/dL (ref 70–99)
Potassium: 3.9 mmol/L (ref 3.5–5.1)
Sodium: 143 mmol/L (ref 135–145)

## 2020-10-02 LAB — URINALYSIS, ROUTINE W REFLEX MICROSCOPIC
Bilirubin Urine: NEGATIVE
Glucose, UA: NEGATIVE mg/dL
Ketones, ur: NEGATIVE mg/dL
Nitrite: NEGATIVE
Protein, ur: 100 mg/dL — AB
RBC / HPF: >50 RBC/hpf — ABNORMAL HIGH (ref 0–5)
Specific Gravity, Urine: 1.012 (ref 1.005–1.030)
WBC, UA: >50 WBC/hpf — ABNORMAL HIGH (ref 0–5)
pH: 5 (ref 5.0–8.0)

## 2020-10-02 LAB — CORTISOL: Cortisol, Plasma: 16.1 ug/dL

## 2020-10-02 LAB — TSH: TSH: 4.834 u[IU]/mL — ABNORMAL HIGH (ref 0.350–4.500)

## 2020-10-02 LAB — CBC
HCT: 31.8 % — ABNORMAL LOW (ref 36.0–46.0)
Hemoglobin: 10.4 g/dL — ABNORMAL LOW (ref 12.0–15.0)
MCH: 29.1 pg (ref 26.0–34.0)
MCHC: 32.7 g/dL (ref 30.0–36.0)
MCV: 88.8 fL (ref 80.0–100.0)
Platelets: 103 10*3/uL — ABNORMAL LOW (ref 150–400)
RBC: 3.58 MIL/uL — ABNORMAL LOW (ref 3.87–5.11)
RDW: 17.8 % — ABNORMAL HIGH (ref 11.5–15.5)
WBC: 2.5 10*3/uL — ABNORMAL LOW (ref 4.0–10.5)
nRBC: 5.9 % — ABNORMAL HIGH (ref 0.0–0.2)

## 2020-10-02 LAB — GLUCOSE, CAPILLARY
Glucose-Capillary: 99 mg/dL (ref 70–99)
Glucose-Capillary: 136 mg/dL — ABNORMAL HIGH (ref 70–99)

## 2020-10-02 MED ORDER — POTASSIUM CHLORIDE CRYS ER 20 MEQ PO TBCR
10.0000 meq | EXTENDED_RELEASE_TABLET | Freq: Two times a day (BID) | ORAL | Status: DC
  Administered 2020-10-02 – 2020-10-14 (×23): 10 meq via ORAL
  Filled 2020-10-02 (×24): qty 1

## 2020-10-02 MED ORDER — POTASSIUM CHLORIDE 2 MEQ/ML FOR ORAL USE: 10.0000 meq | Freq: Two times a day (BID) | ORAL | Status: DC

## 2020-10-02 MED ORDER — METOLAZONE 5 MG PO TABS
5.0000 mg | ORAL_TABLET | Freq: Once | ORAL | Status: AC
  Administered 2020-10-02: 5 mg via ORAL
  Filled 2020-10-02: qty 1

## 2020-10-02 MED ORDER — FUROSEMIDE 10 MG/ML IJ SOLN
120.0000 mg | Freq: Two times a day (BID) | INTRAVENOUS | Status: DC
  Administered 2020-10-03 – 2020-10-10 (×15): 120 mg via INTRAVENOUS
  Filled 2020-10-02: qty 12
  Filled 2020-10-02: qty 10
  Filled 2020-10-02 (×3): qty 12
  Filled 2020-10-02: qty 10
  Filled 2020-10-02 (×3): qty 12
  Filled 2020-10-02 (×3): qty 10
  Filled 2020-10-02: qty 12
  Filled 2020-10-02: qty 10
  Filled 2020-10-02 (×3): qty 12

## 2020-10-02 NOTE — TOC Transition Note (Signed)
Transition of Care [facility]) - CM/SW Discharge Note   Patient Details  Name: Vanessa [name] MRN: [id number] Date of Birth: [birth year]  Transition of Care [facility]) CM/SW Contact:  Vinie Sill, [place] Phone Number: 10/02/2020, 2:49 PM   Clinical Narrative:     CSW received request for MD to complete peer to peer # [phone] option 5 by 2:30pm. CSW informed MD  Thurmond Butts, MSW, LCSW Clinical Social Worker     Barriers to Discharge: Continued Medical Work up   Patient Goals and CMS Choice Patient states their goals for this hospitalization and ongoing recovery are:: to go to SNF CMS Medicare.gov Compare Post Acute Care list provided to:: Patient Choice offered to / list presented to : Patient  Discharge Placement                       Discharge Plan and Services In-house Referral: Clinical Social Work                                   Social Determinants of Health ([place]) Interventions     Readmission Risk Interventions Readmission Risk Prevention Plan 11/08/2019 10/21/2019 09/25/2019  Transportation Screening Complete Complete Complete  PCP or Specialist Appt within 3-5 Days - - Not Complete  Not Complete comments - - plan for SNF  [place]RI or [place] - - Complete  Social Work Consult for [place] Planning/Counseling - - Complete  Palliative Care Screening - - Not Applicable  Medication Review Press photographer) Complete Referral to Pharmacy Referral to Pharmacy  PCP or Specialist appointment within 3-5 days of discharge Complete Complete -  [place] or Home Care Consult Complete (No Data) -  SW Recovery Care/Counseling Consult Complete Complete -  Palliative Care Screening Not Applicable Not Applicable -  Skilled Nursing Facility Complete Complete -  Some recent data might be hidden

## 2020-10-02 NOTE — Consult Note (Addendum)
The patient has been seen in conjunction with Kathyrn Drown, NP-C. All aspects of care have been considered and discussed. The patient has been personally interviewed, examined, and all clinical data has been reviewed.   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  Pulmonary hypertension with right heart failure secondary to left heart failure (presumed).  Recurrent pulmonary emboli, and other etiologies for pulmonary hypertension should be considered  Acute on chronic stage IV/V contributing to volume overload and exacerbation of heart failure.  IV bolus Lasix  Metolazone 5 mg orally prior to the next IV Lasix dose.  If she does not develop brisk urine flow, consider IV Lasix drip and consultation of advanced heart failure team.  The nephrology service needs to be involved.  Goals of care need to be discussed.    Cardiology Consultation:   Patient ID: Vanessa [name]; [id number]; 07/25/1955   Admit date: 09/24/2020 Date of Consult: 10/02/2020  Primary Care Provider: Patient, No Pcp Per Primary Cardiologist: Dr. Meda Coffee, MD   Patient Profile:   Vanessa [name] is a 65 y.o. female with a hx of chronic systolic CHF with LVEF at 35-40%, recent dx of atrial fibrillation not on anticoagulation due to endometrial cancer with bleeding dx 2021, MSSA bacteremia, HTN, HLD, esophageal immobility, DM2, CKD Stage IV/V, asthma, and anemia of chronic disease who is being seen today for the evaluation of CHF at the request of Dr. Dwyane Dee.   History of Present Illness:   [name] is a 65yo F with a hx as stated above who presented to [facility] 09/24/20 with worsening SOB and LE edema. She was last seen by our service 08/10/20 for the evaluation of new systolic CHF and AF with RVR with an LVEF at 35-40% with global hypokinesis, mild concentric LVH and indeterminate filling pressures with  moderately elevated PA pressures at 57.43mmHg and mod TR. Prior echo from 09/2019 with low normal LVEF at 55-60% no RWMA, G2DD, and similarly elevated PA pressures. She was treated with aggressive IV diuretics. Her creatinine elevated quite a bit however she was continued on therapy due to profound fluid. She was not felt to be a candidate for milrinone and not a candidate for cardioversion due to the inability to anticoagulate given endometrial cancer with bleeding and anemia. Nephrology was eventually consulted to assist with diuresis. Plan at cardiology sign off was to focus on palliation and comfort care. She was discharged to SNF with palliative care on 08/18/20.   Unfortunately, she re-presented back to [facility] 09/24/20 with recurrent CHF symptoms and was initially treated with IV Lasix 40mg  BID however this was increased to 80mg  BID 09/28/19. BNP was once again elevated >1700. She has been followed by internal medicine and cardiology has been asked once again to weigh in on persistent HF symptoms.     Past Medical History:  Diagnosis Date  . Anemia 10/09/2019  . Asthma   . Cellulitis and abscess of lower extremity 04/22/2020  . CKD (chronic kidney disease) stage 3, GFR 30-59 ml/min (HCC)   . Dental disease   . Diabetes mellitus without complication ([place])   . Endometrial cancer ([place])   . Esophageal dysmotility   . Heart murmur   . HLD (hyperlipidemia)   . Hypertension   . MSSA bacteremia 09/2019  . Obesity, Class III, BMI 40-49.9 (morbid obesity) ([place])   . Pressure ulcer   . Vaginal  bleeding 05/2020    Past Surgical History:  Procedure Laterality Date  . BIOPSY  10/08/2019   Procedure: BIOPSY;  Surgeon: Clarene Essex, MD;  Location: [facility] ENDOSCOPY;  Service: Endoscopy;;  . ESOPHAGOGASTRODUODENOSCOPY N/A 10/08/2019   Procedure: ESOPHAGOGASTRODUODENOSCOPY (EGD);  Surgeon: Clarene Essex, MD;  Location: [facility];  Service: Endoscopy;  Laterality: N/A;  . TYMPANOSTOMY TUBE PLACEMENT Bilateral       Prior to Admission medications   Medication Sig Start Date End Date Taking? Authorizing Provider  acetaminophen (TYLENOL) 500 MG tablet Take 500 mg by mouth every 6 (six) hours as needed (pain).    Yes [provider]  aspirin 81 MG chewable tablet Chew 1 tablet (81 mg total) by mouth daily. 08/18/20  Yes Mikhail, Velta Addison, DO  carvedilol (COREG) 25 MG tablet Take 1 tablet (25 mg total) by mouth 2 (two) times daily with a meal. 08/18/20  Yes Mikhail, [place], DO  diphenhydrAMINE (BENADRYL) 25 MG tablet Take 25 mg by mouth daily as needed for itching or allergies.   Yes [provider]  ferrous sulfate 325 (65 FE) MG tablet Take 1 tablet (325 mg total) by mouth 2 (two) times daily with a meal. 12/13/19  Yes Medina-Vargas, Monina C, NP  fluticasone (FLONASE) 50 MCG/ACT nasal spray Place 1 spray into both nostrils daily as needed for allergies or rhinitis.   Yes [provider]  hydrALAZINE (APRESOLINE) 50 MG tablet Take 1 tablet (50 mg total) by mouth every 8 (eight) hours. 08/18/20  Yes Mikhail, Leonore, DO  hydrocerin (EUCERIN) CREA Apply 1 application topically daily. 04/29/20  Yes Swayze, Ava, DO  hydrocortisone (ANUSOL-HC) 2.5 % rectal cream Place 1 application rectally every 12 (twelve) hours as needed for hemorrhoids. 08/18/20  Yes Mikhail, Velta Addison, DO  hydrOXYzine (ATARAX/VISTARIL) 25 MG tablet Take 1 tablet (25 mg total) by mouth 3 (three) times daily as needed for itching. 08/18/20  Yes Cristal Ford, DO  [facility]) OINT Apply 1 application topically See admin instructions. To buttocks bid and after incontinent episodes.   Yes [provider]  isosorbide mononitrate (IMDUR) 30 MG 24 hr tablet Take 1 tablet (30 mg total) by mouth daily. 08/19/20  Yes Mikhail, Velta Addison, DO  loratadine (CLARITIN) 10 MG tablet Take 1 tablet (10 mg total) by mouth daily as needed for allergies. 12/13/19  Yes Medina-Vargas, Monina C, NP  nystatin (MYCOSTATIN/NYSTOP)  powder Apply 1 application topically See admin instructions. Under both breasts daily after bath until resolved   Yes [provider]  polyethylene glycol (MIRALAX / GLYCOLAX) 17 g packet Take 17 g by mouth daily. 09/25/19  Yes Arrien, Jimmy Picket, MD  potassium chloride (KLOR-CON) 10 MEQ tablet Take 10 mEq by mouth 2 (two) times daily.   Yes [provider]  torsemide (DEMADEX) 100 MG tablet Take 50 mg by mouth 2 (two) times daily.   Yes [provider]  witch hazel-glycerin (TUCKS) pad Apply topically as needed for itching, hemorrhoids or irritation. 08/18/20  Yes Mikhail, [place], DO  nutrition supplement, JUVEN, (JUVEN) PACK Take 1 packet by mouth 2 (two) times daily between meals. Patient not taking: No sig reported 04/29/20   Swayze, Ava, DO  potassium chloride 20 MEQ TBCR Take 10 mEq by mouth 2 (two) times daily. Patient not taking: No sig reported 08/18/20   Cristal Ford, DO  torsemide (DEMADEX) 100 MG tablet Take 100 mg by mouth 2 (two) times daily. Patient not taking: No sig reported    [provider]  Inpatient Medications: Scheduled Meds: . carvedilol  25 mg Oral BID WC  . ferrous sulfate  325 mg Oral BID WC  . furosemide  80 mg Intravenous BID  . hydrALAZINE  50 mg Oral Q8H  . hydrocortisone cream   Topical BID  . isosorbide mononitrate  30 mg Oral Daily  . nutrition supplement (JUVEN)  1 packet Oral BID BM  . potassium chloride SA  10 mEq Oral BID  . sodium chloride flush  3 mL Intravenous Q12H   Continuous Infusions:  PRN Meds: acetaminophen **OR** acetaminophen, hydrOXYzine, ondansetron **OR** ondansetron (ZOFRAN) IV, phenylephrine, polyethylene glycol  Allergies:    Allergies  Allergen Reactions  . Ace Inhibitors Other (See Comments)    Never prescribed but she has had angioedema  . Angiotensin Receptor Blockers Other (See Comments)    ARB never Rxed but PMH  of angioedema in March 2021    Social History:   Social  History   Socioeconomic History  . Marital status: Married    Spouse name: Not on file  . Number of children: Not on file  . Years of education: Not on file  . Highest education level: Not on file  Occupational History  . Not on file  Tobacco Use  . Smoking status: Former Research scientist (life sciences)  . Smokeless tobacco: Never Used  . Tobacco comment: " years ago ", >77yrs  Vaping Use  . Vaping Use: Never used  Substance and Sexual Activity  . Alcohol use: Yes    Comment: occasional  . Drug use: No  . Sexual activity: Not Currently  Other Topics Concern  . Not on file  Social History Narrative   Lives with her daughter and her 4 children.    Social Determinants of Health   Financial Resource Strain: Not on file  Food Insecurity: Not on file  Transportation Needs: Not on file  Physical Activity: Not on file  Stress: Not on file  Social Connections: Not on file  Intimate Partner Violence: Not on file    Family History:   Family History  Problem Relation Age of Onset  . Stroke Mother   . Diabetes Mother   . Hypertension Mother   . Cancer Mother        possible ovarian  . Diabetes Brother   . Hypertension Brother   . Cancer Maternal Grandmother        possible ovarian   Family Status:  Family Status  Relation Name Status  . Mother  Deceased  . Father  Deceased  . Brother  (Not Specified)  . MGM  (Not Specified)    ROS:  Please see the history of present illness.  All other ROS reviewed and negative.     Physical Exam/Data:   Vitals:   10/02/20 0452 10/02/20 0506 10/02/20 0625 10/02/20 0813  BP: (!) 122/94   (!) 116/95  Pulse: (!) 109   (!) 110  Resp: 16   16  Temp: 98 F (36.7 C) (!) 97.5 F (36.4 C)  (!) 97.5 F (36.4 C)  TempSrc: Axillary Axillary  Oral  SpO2: 100%   100%  Weight:   93.5 kg   Height:        Intake/Output Summary (Last 24 hours) at 10/02/2020 1344 Last data filed at 10/02/2020 0817 Gross per 24 hour  Intake 3 ml  Output 400 ml  Net -397 ml    Filed Weights   09/30/20 0424 10/01/20 1022 10/02/20 0625  Weight: 101.3 kg 100.4 kg 93.5  kg   Body mass index is 41.63 kg/m.   General: Obese, NAD Skin: Warm, dry, intact  Head: Normocephalic, atraumatic, sclera non-icteric, no xanthomas, clear, moist mucus membranes. Neck: Negative for carotid bruits. + JVD Lungs:Clear to ausculation bilaterally. No wheezes, rales, or rhonchi. Breathing is unlabored. Cardiovascular: RRR with S1 S2. No murmurs Abdomen: Soft, non-tender, distended. No obvious abdominal masses. Extremities: 2+ BLE edema. Radial  pulses 2+ bilaterally Neuro: Alert and oriented. No focal deficits. No facial asymmetry. MAE spontaneously. Psych: Responds to questions appropriately with normal affect.    EKG:  The EKG was personally reviewed and demonstrates:  09/25/20 could be underlying atrial flutter>>hard to determine on telemetry  Telemetry:  Telemetry was personally reviewed and demonstrates: 10/02/20 re-entrant atrial tachycardia versus atrial flutter HR in the low 100's   Relevant CV Studies:  Echocardiogram 08/08/20:  1. Left ventricular ejection fraction, by estimation, is 35 to 40%. The  left ventricle has moderately decreased function. The left ventricle  demonstrates global hypokinesis. There is mild concentric left ventricular  hypertrophy. Indeterminate diastolic  filling due to E-A fusion. Elevated left atrial pressure.  2. Right ventricular systolic function is moderately reduced. Mildly  increased right ventricular wall thickness. There is moderately elevated  pulmonary artery systolic pressure. The estimated right ventricular  systolic pressure is 46.5 mmHg.  3. Left atrial size was mildly dilated.  4. The mitral valve is normal in structure. Trivial mitral valve  regurgitation.  5. Tricuspid valve regurgitation is moderate.  6. The aortic valve is normal in structure. Aortic valve regurgitation is  mild. No aortic stenosis is present.  7.  The inferior vena cava is dilated in size with <50% respiratory  variability, suggesting right atrial pressure of 15 mmHg.   Echocardiogram 09/18/2019:  1. Left ventricular ejection fraction, by estimation, is 55 to 60%. The  left ventricle has normal function. The left ventricle has no regional  wall motion abnormalities. Left ventricular diastolic parameters are  consistent with Grade II diastolic  dysfunction (pseudonormalization). Elevated left ventricular end-diastolic  pressure.  2. Right ventricular systolic function is normal. The right ventricular  size is normal. There is moderately elevated pulmonary artery systolic  pressure. The estimated right ventricular systolic pressure is 03.5 mmHg.  3. Left atrial size was moderately dilated.  4. The mitral valve is abnormal. Mild to moderate mitral valve  regurgitation.  5. The aortic valve is tricuspid. Aortic valve regurgitation is trivial.  Mild aortic valve sclerosis is present, with no evidence of aortic valve  stenosis.  6. The inferior vena cava is dilated in size with <50% respiratory  variability, suggesting right atrial pressure of 15 mmHg.   Laboratory Data:  Chemistry Recent Labs  Lab 09/30/20 0300 10/01/20 0232 10/02/20 0305  NA 142 142 143  K 3.8 3.9 3.9  CL 106 107 106  CO2 24 25 24   GLUCOSE 138* 83 80  BUN 98* 100* 105*  CREATININE 3.77* 3.81* 3.98*  CALCIUM 8.2* 8.2* 8.4*  GFRNONAA 13* 12* 12*  ANIONGAP 12 10 13     Total Protein  Date Value Ref Range Status  09/28/2020 6.9 6.5 - 8.1 g/dL Final   Albumin  Date Value Ref Range Status  09/28/2020 2.8 (L) 3.5 - 5.0 g/dL Final   AST  Date Value Ref Range Status  09/28/2020 14 (L) 15 - 41 U/L Final   ALT  Date Value Ref Range Status  09/28/2020 8 0 - 44 U/L Final   Alkaline Phosphatase  Date  Value Ref Range Status  09/28/2020 56 38 - 126 U/L Final   Total Bilirubin  Date Value Ref Range Status  09/28/2020 0.7 0.3 - 1.2 mg/dL Final    Hematology Recent Labs  Lab 09/30/20 0300 10/01/20 0232 10/02/20 0305  WBC 4.0 3.4* 2.5*  RBC 3.70* 3.58* 3.58*  HGB 10.8* 10.4* 10.4*  HCT 33.3* 31.8* 31.8*  MCV 90.0 88.8 88.8  MCH 29.2 29.1 29.1  MCHC 32.4 32.7 32.7  RDW 17.5* 17.7* 17.8*  PLT 103* 97* 103*   Cardiac EnzymesNo results for input(s): TROPONINI in the last 168 hours. No results for input(s): TROPIPOC in the last 168 hours.  BNP Recent Labs  Lab 10/02/20 0305  BNP 1,216.5*    DDimer No results for input(s): DDIMER in the last 168 hours. TSH:  Lab Results  Component Value Date   TSH 4.834 (H) 10/02/2020   Lipids: Lab Results  Component Value Date   CHOL 153 08/10/2015   HDL 61 08/10/2015   LDLCALC 76 08/10/2015   TRIG 82 08/10/2015   CHOLHDL 2.5 08/10/2015   HgbA1c: Lab Results  Component Value Date   HGBA1C 5.9 (H) 04/22/2020    Radiology/Studies:  No results found.  Assessment and Plan:   1. Acute on chronic combined diastolic and systolic heart failure: -Pt presented with worsening SOB with LE edema found to be fluid volume overloaded on exam with a BNP at 1724 and CXR consistent with CHF -Cr this admission at >4.0 -HsT found to be 36>>>37, flat trend not consistent with ACS.  -Echocardiogram from 08/08/20 showed reduced LV function at 35-40% with global hypokinesis, mild concentric LVH and indeterminate filling pressures with moderately elevated PA pressures at 57.67mmHg and mod TR. Prior echo from 09/2019 with low normal LVEF at 55-60% no RWMA, G2DD, and similarly elevated PA pressures.  -She has been treated with IV diuretics (Lasix 80mg  BID) however with poor response. -Weight, 206lb with an admission weight at 226lb. At last cardiology consultation, weight noted to be 226lb  -I&O, net negative 684ml since admission -Continue IV Lasix and add metolazone 5mg  x1 and follow response. If she continues to have poor UO, would consider consulting AHF for other options.    2. Hx of paroxysmal  ayrial flutter>>now with atrial flutter versus underlying re-entrant atrial tachycardia: -Dx with AF on last hospital admission 07/2020 treated with carvedilol -Not on anticoagulation due to endometrial bleeding secondary to endometrial cancer -Not a candidate for DCCV due to the absence of AC -Not a candidate of digoxin due to poor renal function -Unclear as to how much rhymn is contributing to her symptoms  3. Acute on chronic kidney disease stage IV/V: -Baseline Cr appears to be in the 2.4 range however she began having issues with renal dysfunction on lin 2021with creatinine levels sustaining in the 2.8-3.5 range. Nephrology was consulted and followed for diuretic management. She had a renal US at that time that showed an atrophic left kidney with normal appearing right. Despite worsening function, she was markedly fluid volume overloaded therefore aggressive diuresis was continued.  -Creatinine on admission, 4.31 with slight improvement since that time to 3.98 today  -UO has been poor despite IV Lasix -Would consider renal consult at this time to assist with diuretics>>likely not much more cardiology can offer at this time  4. Elevated troponin: -HsT, 36>>37 due to demand ischemia in the setting of HF and significant fluid volume overload  -No c/o chest pain   5. Endometrial cancer: -Non-operative with  goal to control bleeding and remain comfortable -Followed by OP Palliative care  6. DM2: -HbA1c, 8.4 -SSI for glucose control while inpatient status  -Management per IM   7. HTN: -Stable, 116/95>>122/94>>113/85 -Continue carvedilol 25, hydralazine 50, Imdur 30   For questions or updates, please contact [place] Please consult [email or web address] for contact info under Cardiology/STEMI.   SignedKathyrn Drown NP-C [facility] Pager: [phone] 10/02/2020 1:44 PM

## 2020-10-02 NOTE — Consult Note (Signed)
Nephrology Consult   Requesting provider: Shawna Clamp, MD  Assessment/Recommendations:     AKI on CKD4, worsening, now oliguric: Likely secondary to cardiorenal -seems that she is refractory to diuretics at this time, diuresis at the direction of cardiology. If no improvement, may benefit from an inotrope. Overall, I do believe she is heading down the route of needing renal replacement therapy very soon. She is exhibiting some early uremic symptoms and quite hypervolemic. -Given her particular issues for renal replacement therapy, I would advocate for a palliative care consult first given her overall clinical situation with heart failure, ongoing endometrial carcinoma, underlying CKD4, and the fact that palliative was following her apparently at [facility]. I do not know what conversations she has had with palliative there. I did discuss with her the chances of her needing renal replacement therapy and she did say she would want to think about it for now. I did inform her that we are aiming for medical management as of right now -Palliative care consult placed -If there are no further improvements with her particular case by tomorrow, may need to decide on RRT sooner rather than later. Appreciate assistance from cardiology -UA w/ microscopy ordered -Continue to monitor daily Cr, Dose meds for GFR<15 -Monitor Daily I/Os, Daily weight  -Maintain MAP>65 for optimal renal perfusion.  -Agree with holding ACE-I, avoid further nephrotoxins including NSAIDS, Morphine.  Unless absolutely necessary, avoid CT with contrast and/or MRI with gadolinium.     Acute on chronic combined diastolic and systolic heart failure -Cardiology on board, appreciate assistance with diuretics.  Agree with metolazone.  Will increase her Lasix to 120 twice daily  A flutter -Not a candidate for cardioversion, anticoagulation  Endometrial carcinoma -Nonoperative management, palliative management  Hypertension -BP is  acceptable, can considering decreasing hydralazine to 25 mg if needed  Thrombocytopenia -Monitoring, per primary  Anemia due to chronic disease and history of blood loss: -Transfuse for Hgb<7 g/dL -hgb stable, if lower then please check iron panel+ferritin  Uncontrolled Diabetes Mellitus Type 2 with Hyperglycemia -Management per primary  [facility] 10/02/2020 3:20 PM   _____________________________________________________________________________________   History of Present Illness: Vanessa [name] is a/an 65 y.o. female with a past medical history of CKD4, left renal atrophy, chronic combined systolic and diastolic CHF, A. fib not on anticoagulation due to chronic bleeding from endometrial carcinoma, DM2, hypertension, anemia of chronic disease, history of MSSA bacteremia, stage I endometrial cancer status post palliative radiation who presents to [facility] with shortness of breath and edema.  Found to have acute on chronic CHF exacerbation.  She was recently admitted from January to February 2022 for similar issues.  She was ultimately discharged to SNF with palliative care.  She has been treated with Lasix 80 mg IV twice daily and had some stability in her kidney function the creatinine around 3.8 (which is around her baseline) and is now up to 4. She currently reports that her legs continue to feel heavier.  She now has been having some nausea, dysgeusia, loss of appetite.  She also reports feeling very cold which is her biggest complaint.   Medications:  Current Facility-Administered Medications  Medication Dose Route Frequency Provider Last Rate Last Admin  . acetaminophen (TYLENOL) tablet 650 mg  650 mg Oral Q6H PRN Lenore Cordia, MD   650 mg at 10/01/20 0501   Or  . acetaminophen (TYLENOL) suppository 650 mg  650 mg Rectal Q6H PRN Lenore Cordia, MD      .  carvedilol (COREG) tablet 25 mg  25 mg Oral BID WC Lenore Cordia, MD   25 mg at 10/02/20 0811  .  ferrous sulfate tablet 325 mg  325 mg Oral BID WC Zada Finders R, MD   325 mg at 10/02/20 0813  . furosemide (LASIX) injection 80 mg  80 mg Intravenous BID Dana Allan I, MD   80 mg at 10/02/20 0813  . hydrALAZINE (APRESOLINE) tablet 50 mg  50 mg Oral Q8H Zada Finders R, MD   50 mg at 10/02/20 0092  . hydrocortisone cream 1 %   Topical BID Rai, Vernelle Emerald, MD   Given at 10/01/20 2146  . hydrOXYzine (ATARAX/VISTARIL) tablet 25 mg  25 mg Oral TID PRN Lenore Cordia, MD   25 mg at 10/01/20 2005  . isosorbide mononitrate (IMDUR) 24 hr tablet 30 mg  30 mg Oral Daily Zada Finders R, MD   30 mg at 10/02/20 0813  . nutrition supplement (JUVEN) (JUVEN) powder packet 1 packet  1 packet Oral BID BM Lenore Cordia, MD   1 packet at 10/02/20 0813  . ondansetron (ZOFRAN) tablet 4 mg  4 mg Oral Q6H PRN Lenore Cordia, MD       Or  . ondansetron (ZOFRAN) injection 4 mg  4 mg Intravenous Q6H PRN Zada Finders R, MD      . phenylephrine ((USE for PREPARATION-H)) 0.25 % suppository 1 suppository  1 suppository Rectal BID PRN Rai, Vernelle Emerald, MD   1 suppository at 10/01/20 0502  . polyethylene glycol (MIRALAX / GLYCOLAX) packet 17 g  17 g Oral Daily PRN Lenore Cordia, MD   17 g at 09/30/20 0930  . potassium chloride SA (KLOR-CON) CR tablet 10 mEq  10 mEq Oral BID Einar Grad, RPH   10 mEq at 10/02/20 1112  . sodium chloride flush (NS) 0.9 % injection 3 mL  3 mL Intravenous Q12H Zada Finders R, MD   3 mL at 10/02/20 0817     ALLERGIES Ace inhibitors and Angiotensin receptor blockers  MEDICAL HISTORY Past Medical History:  Diagnosis Date  . Anemia 10/09/2019  . Asthma   . Cellulitis and abscess of lower extremity 04/22/2020  . CKD (chronic kidney disease) stage 3, GFR 30-59 ml/min (HCC)   . Dental disease   . Diabetes mellitus without complication ([place])   . Endometrial cancer ([place])   . Esophageal dysmotility   . Heart murmur   . HLD (hyperlipidemia)   . Hypertension   . MSSA  bacteremia 09/2019  . Obesity, Class III, BMI 40-49.9 (morbid obesity) ([place])   . Pressure ulcer   . Vaginal bleeding 05/2020     SOCIAL HISTORY Social History   Socioeconomic History  . Marital status: Married    Spouse name: Not on file  . Number of children: Not on file  . Years of education: Not on file  . Highest education level: Not on file  Occupational History  . Not on file  Tobacco Use  . Smoking status: Former Research scientist (life sciences)  . Smokeless tobacco: Never Used  . Tobacco comment: " years ago ", >47yrs  Vaping Use  . Vaping Use: Never used  Substance and Sexual Activity  . Alcohol use: Yes    Comment: occasional  . Drug use: No  . Sexual activity: Not Currently  Other Topics Concern  . Not on file  Social History Narrative   Lives with her daughter and her 4 children.  Social Determinants of Health   Financial Resource Strain: Not on file  Food Insecurity: Not on file  Transportation Needs: Not on file  Physical Activity: Not on file  Stress: Not on file  Social Connections: Not on file  Intimate Partner Violence: Not on file     FAMILY HISTORY Family History  Problem Relation Age of Onset  . Stroke Mother   . Diabetes Mother   . Hypertension Mother   . Cancer Mother        possible ovarian  . Diabetes Brother   . Hypertension Brother   . Cancer Maternal Grandmother        possible ovarian     Review of Systems: 12 systems reviewed Otherwise as per HPI, all other systems reviewed and negative  Physical Exam: Vitals:   10/02/20 0813 10/02/20 1409  BP: (!) 116/95 (!) 87/67  Pulse: (!) 110   Resp: 16   Temp: (!) 97.5 F (36.4 C) 98.1 F (36.7 C)  SpO2: 100%    Total I/O In: 3 [I.V.:3] Out: -   Intake/Output Summary (Last 24 hours) at 10/02/2020 1520 Last data filed at 10/02/2020 8280 Gross per 24 hour  Intake 3 ml  Output 400 ml  Net -397 ml   General: well-appearing, no acute distress HEENT: anicteric sclera, poor dentition CV: regular  rate, normal rhythm, no murmurs, no gallops, no rubs, Lungs: clear to auscultation bilaterally, normal work of breathing Abd: soft, non-tender, non-distended Skin: no visible lesions or rashes Psych: alert, engaged, appropriate mood and affect Musculoskeletal: 3++ pitting edema Neuro: normal speech, no gross focal deficits, minimal tremors  Test Results Reviewed Lab Results  Component Value Date   NA 143 10/02/2020   K 3.9 10/02/2020   CL 106 10/02/2020   CO2 24 10/02/2020   BUN 105 (H) 10/02/2020   CREATININE 3.98 (H) 10/02/2020   GLU 130 12/10/2019   CALCIUM 8.4 (L) 10/02/2020   ALBUMIN 2.8 (L) 09/28/2020   PHOS 5.7 (H) 09/29/2020     I have reviewed all relevant outside healthcare records related to the patient's kidney injury.

## 2020-10-02 NOTE — Progress Notes (Signed)
Cross-coverage note:   Called regarding rectal temp 35.2 C. Patient alert and fully oriented, no infectious symptoms or new complaints. Possibly from uremia, plan to check CBG, TSH, and random cortisol, use warm blankets for now.

## 2020-10-02 NOTE — Plan of Care (Signed)
Monitoring pt closely for sepsis. Vss at this time. Temp low but stable. Ua sent and positive for UTI.    Problem: Education: Goal: Ability to demonstrate management of disease process will improve Outcome: Not Progressing Goal: Ability to verbalize understanding of medication therapies will improve Outcome: Not Progressing Goal: Individualized Educational Video(s) Outcome: Not Progressing   Problem: Activity: Goal: Capacity to carry out activities will improve Outcome: Not Progressing   Problem: Cardiac: Goal: Ability to achieve and maintain adequate cardiopulmonary perfusion will improve Outcome: Not Progressing   Problem: Education: Goal: Knowledge of General Education information will improve Description: Including pain rating scale, medication(s)/side effects and non-pharmacologic comfort measures Outcome: Not Progressing   Problem: Health Behavior/Discharge Planning: Goal: Ability to manage health-related needs will improve Outcome: Not Progressing   Problem: Clinical Measurements: Goal: Ability to maintain clinical measurements within normal limits will improve Outcome: Not Progressing Goal: Will remain free from infection Outcome: Not Progressing Goal: Diagnostic test results will improve Outcome: Not Progressing Goal: Respiratory complications will improve Outcome: Not Progressing Goal: Cardiovascular complication will be avoided Outcome: Not Progressing   Problem: Activity: Goal: Risk for activity intolerance will decrease Outcome: Not Progressing   Problem: Nutrition: Goal: Adequate nutrition will be maintained Outcome: Not Progressing   Problem: Coping: Goal: Level of anxiety will decrease Outcome: Not Progressing   Problem: Elimination: Goal: Will not experience complications related to bowel motility Outcome: Not Progressing Goal: Will not experience complications related to urinary retention Outcome: Not Progressing   Problem: Pain  Managment: Goal: General experience of comfort will improve Outcome: Not Progressing   Problem: Safety: Goal: Ability to remain free from injury will improve Outcome: Not Progressing   Problem: Skin Integrity: Goal: Risk for impaired skin integrity will decrease Outcome: Not Progressing

## 2020-10-02 NOTE — TOC Progression Note (Signed)
Transition of Care [facility]) - Progression Note    Patient Details  Name: Vanessa [name] MRN: [id number] Date of Birth: Oct 13, 1955  Transition of Care [facility]) CM/SW [place], [place] Phone Number: 10/02/2020, 10:13 AM  Clinical Narrative:     Insurance authorization remains pending  Thurmond Butts, MSW, LCSW Clinical Social Worker   Expected Discharge Plan: Skilled Nursing Facility Barriers to Discharge: Continued Medical Work up  Expected Discharge Plan and Services Expected Discharge Plan: [place] In-house Referral: Clinical Social Work     Living arrangements for the past 2 months: [place]                                       Social Determinants of Health (SDOH) Interventions    Readmission Risk Interventions Readmission Risk Prevention Plan 11/08/2019 10/21/2019 09/25/2019  Transportation Screening Complete Complete Complete  PCP or Specialist Appt within 3-5 Days - - Not Complete  Not Complete comments - - plan for SNF  [place] or [place] - - Complete  Social Work Consult for [place] Planning/Counseling - - Complete  Palliative Care Screening - - Not Applicable  Medication Review Press photographer) Complete Referral to Pharmacy Referral to Pharmacy  PCP or Specialist appointment within 3-5 days of discharge Complete Complete -  [place] or Home Care Consult Complete (No Data) -  SW Recovery Care/Counseling Consult Complete Complete -  Palliative Care Screening Not Applicable Not Applicable -  Skilled Nursing Facility Complete Complete -  Some recent data might be hidden

## 2020-10-02 NOTE — TOC Progression Note (Signed)
Transition of Care [facility]) - Progression Note    Patient Details  Name: Vanessa [name] MRN: [id number] Date of Birth: 09-25-1955  Transition of Care [facility]) CM/SW [place], [place] Phone Number: 10/02/2020, 3:03 PM  Clinical Narrative:     CSW insurance denied SNF/Rehab authorization. Patient will still d/c to [facility] when medically stable.  Thurmond Butts, MSW, LCSW Clinical Social Worker   Expected Discharge Plan: Skilled Nursing Facility Barriers to Discharge: Continued Medical Work up  Expected Discharge Plan and Services Expected Discharge Plan: [place] In-house Referral: Clinical Social Work     Living arrangements for the past 2 months: [place]                                       Social Determinants of Health (SDOH) Interventions    Readmission Risk Interventions Readmission Risk Prevention Plan 11/08/2019 10/21/2019 09/25/2019  Transportation Screening Complete Complete Complete  PCP or Specialist Appt within 3-5 Days - - Not Complete  Not Complete comments - - plan for SNF  [place] or [place] - - Complete  Social Work Consult for [place] Planning/Counseling - - Complete  Palliative Care Screening - - Not Applicable  Medication Review Press photographer) Complete Referral to Pharmacy Referral to Pharmacy  PCP or Specialist appointment within 3-5 days of discharge Complete Complete -  [place] or Home Care Consult Complete (No Data) -  SW Recovery Care/Counseling Consult Complete Complete -  Palliative Care Screening Not Applicable Not Applicable -  Skilled Nursing Facility Complete Complete -  Some recent data might be hidden

## 2020-10-02 NOTE — Progress Notes (Signed)
Occupational Therapy Treatment Patient Details Name: Vanessa [name] MRN: [id number] DOB: 1955-12-17 Today's Date: 10/02/2020    History of present illness Patient is a 65 y/o female who presents with SOB, weakness, edema and dizziness. Admitted with volume overload secondary to heart failure and CKD. Recent admission 1/28-2/8 for CHF exacerbation, AKI and new A-fib with RVR. PMH includes endometrial carcinoma 2021, COVID-19 PNA 2021, MSSA baceteremia, essential HTN, DM2, CKD, chronic diastolic HF.   OT comments  Pt up in chair and easily awakened, but lethargic. Participated in grooming with set up in sitting and LB bathing and dressing with max to total assist.  Pt remained up in chair at end of session.   Follow Up Recommendations  SNF;Supervision/Assistance - 24 hour    Equipment Recommendations  Wheelchair (measurements OT);Wheelchair cushion (measurements OT);Hospital bed;3 in 1 bedside commode    Recommendations for Other Services      Precautions / Restrictions Precautions Precautions: Fall       Mobility Bed Mobility               General bed mobility comments: received in chair    Transfers                      Balance Overall balance assessment: Needs assistance   Sitting balance-Leahy Scale: Fair                                     ADL either performed or assessed with clinical judgement   ADL Overall ADL's : Needs assistance/impaired     Grooming: Wash/dry hands;Wash/dry face;Brushing hair;Sitting;Set up       Lower Body Bathing: Maximal assistance;Sitting/lateral leans       Lower Body Dressing: Total assistance;Sit to/from stand Lower Body Dressing Details (indicate cue type and reason): socks               General ADL Comments: fatigues easily     Vision       Perception     Praxis      Cognition Arousal/Alertness: Lethargic Behavior During Therapy: Flat affect Overall Cognitive Status: Within  Functional Limits for tasks assessed                                 General Comments: pt aware of medical plan        Exercises     Shoulder Instructions       General Comments      Pertinent Vitals/ Pain       Pain Assessment: No/denies pain  Home Living                                          Prior Functioning/Environment              Frequency  Min 2X/week        Progress Toward Goals  OT Goals(current goals can now be found in the care plan section)  Progress towards OT goals: Progressing toward goals  Acute Rehab OT Goals Patient Stated Goal: improve activity tolerance, go to rehab OT Goal Formulation: With patient Time For Goal Achievement: 10/12/20 Potential to Achieve Goals: [place] Discharge plan remains appropriate    Co-evaluation  AM-PAC OT "6 Clicks" Daily Activity     Outcome Measure   Help from another person eating meals?: None Help from another person taking care of personal grooming?: A Little Help from another person toileting, which includes using toliet, bedpan, or urinal?: A Lot Help from another person bathing (including washing, rinsing, drying)?: A Lot Help from another person to put on and taking off regular upper body clothing?: A Little Help from another person to put on and taking off regular lower body clothing?: Total 6 Click Score: 15    End of Session    OT Visit Diagnosis: Unsteadiness on feet (R26.81);Other abnormalities of gait and mobility (R26.89);Muscle weakness (generalized) (M62.81)   Activity Tolerance Patient limited by fatigue   Patient Left in chair;with call bell/phone within reach;with chair alarm set   Nurse Communication          Time: [phone] OT Time Calculation (min): 16 min  Charges: OT General Charges $OT Visit: 1 Visit OT Treatments $Self Care/Home Management : 8-22 mins  Nestor Lewandowsky, OTR/L Acute Rehabilitation  Services Pager: [phone] Office: [phone]   Malka So 10/02/2020, 10:28 AM

## 2020-10-02 NOTE — Progress Notes (Deleted)
   10/02/20 1654  Assess: MEWS Score  BP (!) 135/103  Assess: MEWS Score  MEWS Temp 1  MEWS Systolic 0  MEWS Pulse 2  MEWS RR 1  MEWS LOC 0  MEWS Score 4  MEWS Score Color Red  Assess: if the MEWS score is Yellow or Red  Were vital signs taken at a resting state? Yes  Focused Assessment No change from prior assessment  Early Detection of Sepsis Score *See Row Information* High  MEWS guidelines implemented *See Row Information* Yes  Treat  MEWS Interventions Escalated (See documentation below)  Pain Scale 0-10  Pain Score 0  Take Vital Signs  Increase Vital Sign Frequency  Red: Q 1hr X 4 then Q 4hr X 4, if remains red, continue Q 4hrs  Escalate  MEWS: Escalate Red: discuss with charge nurse/RN and provider, consider discussing with RRT  Notify: Charge Nurse/RN  Name of Charge Nurse/RN Notified [name] RN  Date Charge Nurse/RN Notified 10/02/20  Time Charge Nurse/RN Notified [id number]  Notify: Provider  Provider Name/Title Dr. Dwyane Dee  Date Provider Notified 10/02/20  Time Provider Notified 1659  Notification Type Page  Notification Reason Critical result  Provider response See new orders  Date of Provider Response 10/02/20  Time of Provider Response 1700   Rectal temp 96.7.  Gave her warm blankets.  Paged Dr. Dwyane Dee and order received for K pad.

## 2020-10-02 NOTE — Progress Notes (Signed)
   10/02/20 1650  Assess: MEWS Score  Temp (!) 96.7 F (35.9 C)  BP (!) 135/103  Pulse Rate (!) 109  ECG Heart Rate (!) 111  Resp (!) 21  Level of Consciousness Alert  SpO2 99 %  O2 Device Room Air  Assess: MEWS Score  MEWS Temp 1  MEWS Systolic 0  MEWS Pulse 2  MEWS RR 1  MEWS LOC 0  MEWS Score 4  MEWS Score Color Red  Assess: if the MEWS score is Yellow or Red  Were vital signs taken at a resting state? Yes  Focused Assessment No change from prior assessment  Early Detection of Sepsis Score *See Row Information* High  MEWS guidelines implemented *See Row Information* Yes  Treat  MEWS Interventions Escalated (See documentation below)  Pain Scale 0-10  Pain Score 0  Take Vital Signs  Increase Vital Sign Frequency  Red: Q 1hr X 4 then Q 4hr X 4, if remains red, continue Q 4hrs  Escalate  MEWS: Escalate Red: discuss with charge nurse/RN and provider, consider discussing with RRT  Notify: Charge Nurse/RN  Name of Charge Nurse/RN Notified [name] RN  Date Charge Nurse/RN Notified 10/02/20  Time Charge Nurse/RN Notified 0981  Notify: Provider  Provider Name/Title Dr. Dwyane Dee  Date Provider Notified 10/02/20  Time Provider Notified 1659  Notification Type Page  Notification Reason Critical result  Provider response See new orders  Date of Provider Response 10/02/20  Time of Provider Response 1700  Document  Patient Outcome Stabilized after interventions  Progress note created (see row info) Yes   Temp 96.7 rectally. New order for K pad.  Applied warm blankets.

## 2020-10-02 NOTE — Progress Notes (Addendum)
Bladder scan result was 568 mL.  Pt refused to be in/out cathed.  She stated she would try to void.  Information passed on to night shift.  Idolina Primer, RN

## 2020-10-02 NOTE — Progress Notes (Signed)
PROGRESS NOTE    Vanessa [name]  WCH:[id number] DOB: [birth year] DOA: 09/24/2020 PCP: Patient, No Pcp Per   Brief Narrative: This 65 years old female with PMH significant for chronic combined systolic and diastolic CHF(EF 35 to 35% by TTE 08/08/20), atrial fibrillation not on anticoagulation due to chronic bleeding from endometrial cancer, CKD stage IV, type II DM, hypertension, anemia of chronic disease, history of MSSA bacteremia, stage I endometrial cancer status post palliative radiation presented to the ED from SNF with shortness of breath and peripheral edema. Patient recently admitted from 08/07/2020-08/18/2020 for acute on chronic combined systolic and diastolic CHF exacerbation, AKI on CKD stage IV, and new onset A. fib with RVR.  Patient was managed with IV diuresis before transition to oral torsemide. A. fib/flutter was controlled with Coreg.  Anticoagulation not started due to high risk of bleeding.  Patient was ultimately discharged to SNF with palliative care. Patient is admitted for acute on chronic CHF exacerbation,  started on diuresis.  Cardiology and nephrology has been consulted.  Assessment & Plan:   Principal Problem:   Acute on chronic combined systolic (congestive) and diastolic (congestive) heart failure (HCC) Active Problems:   HTN (hypertension)   Thrombocytopenia (HCC)   Acute kidney injury superimposed on CKD (HCC)   Acute on chronic combined systolic (congestive) and diastolic (congestive) heart failure ([place]) - She presented with progressive peripheral edema, orthopnea, DOE, BNP 1724. - 2D echo 07/2020 had shown EF of 35 to 40%, -Continued IV Lasix 40 mg twice daily, received Lasix 60 mg IV x1 in ED -Continue strict I's and O's and daily weights -Continue Coreg, not on ACE/ARB/spironolactone due to renal dysfunction 09/26/2020: CHF symptoms seem to be improving. 09/27/2020: Increased the dose of IV Lasix from 40 mg twice daily to 80 mg twice daily.    Continue to  monitor renal function and electrolytes.  Strict I's and O's.  Cardiac diet.  Avoid excessive sodium intake.   3/25: She continued to remain fluid overloaded, cardiology consulted.   Active problems Acute kidney injury superimposed on CKD stage IV -Likely worsened due to acute on chronic CHF, baseline creatinine 3.6-3.7 -Presented with creatinine of 4.3, continue diuresis and monitor renal function. -Creatinine improving, 3.98 today 09/28/2020: Renal function may be close to baseline.   Continue to monitor renal function, electrolytes, I's and O's.  3/25: Nephrology consulted,  awaiting recommendation.   Paroxysmal atrial fibrillation -Continue Coreg 25 mg twice daily, HR slightly elevated in low 100's, in atrial fibrillation -Not on anticoagulation due to high risk of bleeding.   Essential hypertension -BP stable, continue Coreg, hydralazine, Imdur. -Continue IV Lasix   Thrombocytopenia -Unclear etiology, mild,  no active bleeding, monitor counts.   History of endometrial cancer with chronic abnormal uterine bleeding Anemia due to chronic blood loss and CKD stage IV -Baseline ~9 Currently stable, 10.6- 10.8     Obesity Estimated body mass index is 44.17 kg/m as calculated from the following:   Height as of this encounter: 4\' 11"  (1.499 m).   Weight as of this encounter: 99.2 kg.   DVT prophylaxis: SCDs Code Status:DNR Family Communication: No family at bed side. Disposition Plan:   Status is: Inpatient  Remains inpatient appropriate because:Inpatient level of care appropriate due to severity of illness   Dispo: The patient is from: Home              Anticipated d/c is to: SNF in 1-2 days  Patient currently is  Not medically stable for dc   Difficult to place patient No  Consultants:    None  Procedures:  Antimicrobials:  Anti-infectives (From admission, onward)   None      Subjective: Patient was seen and examined at bedside.  Overnight  events noted.   She reports feeling better, but still appears short of breath. She denies any chest pain or shortness of breath. Leg swelling improving.  Objective: Vitals:   10/02/20 0506 10/02/20 0625 10/02/20 0813 10/02/20 1409  BP:   (!) 116/95 (!) 87/67  Pulse:   (!) 110   Resp:   16   Temp: (!) 97.5 F (36.4 C)  (!) 97.5 F (36.4 C) 98.1 F (36.7 C)  TempSrc: Axillary  Oral Oral  SpO2:   100%   Weight:  93.5 kg    Height:        Intake/Output Summary (Last 24 hours) at 10/02/2020 1514 Last data filed at 10/02/2020 0817 Gross per 24 hour  Intake 3 ml  Output 400 ml  Net -397 ml   Filed Weights   09/30/20 0424 10/01/20 1022 10/02/20 0625  Weight: 101.3 kg 100.4 kg 93.5 kg    Examination:  General exam: Appears calm and comfortable , not in any acute distress Respiratory system: Clear to auscultation. Respiratory effort normal. Cardiovascular system: S1 & S2 heard, RRR. No JVD, murmurs, rubs, gallops or clicks. No pedal edema. Gastrointestinal system: Abdomen is nondistended, soft and nontender. No organomegaly or masses felt. Normal bowel sounds heard. Central nervous system: Alert and oriented. No focal neurological deficits. Extremities: Symmetric 5 x 5 power.  Bilateral pedal edema++ Skin: No rashes, lesions or ulcers Psychiatry: Judgement and insight appear normal. Mood & affect appropriate.     Data Reviewed: I have personally reviewed following labs and imaging studies  CBC: Recent Labs  Lab 09/28/20 0505 09/29/20 0234 09/30/20 0300 10/01/20 0232 10/02/20 0305  WBC 2.6* 2.4* 4.0 3.4* 2.5*  HGB 10.7* 10.6* 10.8* 10.4* 10.4*  HCT 34.0* 33.6* 33.3* 31.8* 31.8*  MCV 90.4 91.1 90.0 88.8 88.8  PLT 98* 98* 103* 97* 706*   Basic Metabolic Panel: Recent Labs  Lab 09/28/20 0505 09/29/20 0234 09/30/20 0300 10/01/20 0232 10/02/20 0305  NA 143 143 142 142 143  K 3.7 3.5 3.8 3.9 3.9  CL 107 106 106 107 106  CO2 24 27 24 25 24   GLUCOSE 93 110* 138* 83  80  BUN 89* 94* 98* 100* 105*  CREATININE 3.78* 3.76* 3.77* 3.81* 3.98*  CALCIUM 8.0* 8.0* 8.2* 8.2* 8.4*  MG  --  2.1  --   --   --   PHOS  --  5.7*  --   --   --    GFR: Estimated Creatinine Clearance: 13.9 mL/min (A) (by C-G formula based on SCr of 3.98 mg/dL (H)). Liver Function Tests: Recent Labs  Lab 09/27/20 0216 09/28/20 0505  AST  --  14*  ALT  --  8  ALKPHOS  --  56  BILITOT  --  0.7  PROT  --  6.9  ALBUMIN 2.8* 2.8*   No results for input(s): LIPASE, AMYLASE in the last 168 hours. No results for input(s): AMMONIA in the last 168 hours. Coagulation Profile: No results for input(s): INR, PROTIME in the last 168 hours. Cardiac Enzymes: No results for input(s): CKTOTAL, CKMB, CKMBINDEX, TROPONINI in the last 168 hours. BNP (last 3 results) No results for input(s): PROBNP in the last 8760  hours. HbA1C: No results for input(s): HGBA1C in the last 72 hours. CBG: Recent Labs  Lab 10/02/20 0059  GLUCAP 99   Lipid Profile: No results for input(s): CHOL, HDL, LDLCALC, TRIG, CHOLHDL, LDLDIRECT in the last 72 hours. Thyroid Function Tests: Recent Labs    10/02/20 0305  TSH 4.834*   Anemia Panel: No results for input(s): VITAMINB12, FOLATE, FERRITIN, TIBC, IRON, RETICCTPCT in the last 72 hours. Sepsis Labs: No results for input(s): PROCALCITON, LATICACIDVEN in the last 168 hours.  Recent Results (from the past 240 hour(s))  Resp Panel by RT-PCR (Flu A&B, Covid) Nasopharyngeal Swab     Status: None   Collection Time: 09/24/20  7:31 PM   Specimen: Nasopharyngeal Swab; Nasopharyngeal(NP) swabs in vial transport medium  Result Value Ref Range Status   SARS Coronavirus 2 by RT PCR NEGATIVE NEGATIVE Final    Comment: (NOTE) SARS-CoV-2 target nucleic acids are NOT DETECTED.  The SARS-CoV-2 RNA is generally detectable in upper respiratory specimens during the acute phase of infection. The lowest concentration of SARS-CoV-2 viral copies this assay can detect is 138  copies/mL. A negative result does not preclude SARS-Cov-2 infection and should not be used as the sole basis for treatment or other patient management decisions. A negative result may occur with  improper specimen collection/handling, submission of specimen other than nasopharyngeal swab, presence of viral mutation(s) within the areas targeted by this assay, and inadequate number of viral copies(<138 copies/mL). A negative result must be combined with clinical observations, patient history, and epidemiological information. The expected result is Negative.  Fact Sheet for Patients:  EntrepreneurPulse.com.au  Fact Sheet for Healthcare Providers:  IncredibleEmployment.be  This test is no t yet approved or cleared by the [place] FDA and  has been authorized for detection and/or diagnosis of SARS-CoV-2 by FDA under an Emergency Use Authorization (EUA). This EUA will remain  in effect (meaning this test can be used) for the duration of the COVID-19 declaration under Section 564(b)(1) of the Act, 21 U.S.C.section 360bbb-3(b)(1), unless the authorization is terminated  or revoked sooner.       Influenza A by PCR NEGATIVE NEGATIVE Final   Influenza B by PCR NEGATIVE NEGATIVE Final    Comment: (NOTE) The Xpert Xpress SARS-CoV-2/FLU/RSV plus assay is intended as an aid in the diagnosis of influenza from Nasopharyngeal swab specimens and should not be used as a sole basis for treatment. Nasal washings and aspirates are unacceptable for Xpert Xpress SARS-CoV-2/FLU/RSV testing.  Fact Sheet for Patients: EntrepreneurPulse.com.au  Fact Sheet for Healthcare Providers: IncredibleEmployment.be  This test is not yet approved or cleared by the [place] FDA and has been authorized for detection and/or diagnosis of SARS-CoV-2 by FDA under an Emergency Use Authorization (EUA). This EUA will remain in effect (meaning  this test can be used) for the duration of the COVID-19 declaration under Section 564(b)(1) of the Act, 21 U.S.C. section 360bbb-3(b)(1), unless the authorization is terminated or revoked.  Performed at [facility], [place] [address], [place], [place] [address]   MRSA PCR Screening     Status: Abnormal   Collection Time: 09/26/20 10:14 AM   Specimen: Nasopharyngeal  Result Value Ref Range Status   MRSA by PCR POSITIVE (A) NEGATIVE Final    Comment:        The GeneXpert MRSA Assay (FDA approved for NASAL specimens only), is one component of a comprehensive MRSA colonization surveillance program. It is not intended to diagnose MRSA infection nor to guide or  monitor treatment for MRSA infections. RESULT CALLED TO, READ BACK BY AND VERIFIED WITH: F,VEATTH @1317  09/26/20 EB Performed at [facility], [place] [address], [place], [place] [address]   Resp Panel by RT-PCR (Flu A&B, Covid) Nasopharyngeal Swab     Status: None   Collection Time: 10/01/20 11:03 AM   Specimen: Nasopharyngeal Swab; Nasopharyngeal(NP) swabs in vial transport medium  Result Value Ref Range Status   SARS Coronavirus 2 by RT PCR NEGATIVE NEGATIVE Final    Comment: (NOTE) SARS-CoV-2 target nucleic acids are NOT DETECTED.  The SARS-CoV-2 RNA is generally detectable in upper respiratory specimens during the acute phase of infection. The lowest concentration of SARS-CoV-2 viral copies this assay can detect is 138 copies/mL. A negative result does not preclude SARS-Cov-2 infection and should not be used as the sole basis for treatment or other patient management decisions. A negative result may occur with  improper specimen collection/handling, submission of specimen other than nasopharyngeal swab, presence of viral mutation(s) within the areas targeted by this assay, and inadequate number of viral copies(<138 copies/mL). A negative result must be combined with clinical observations, patient history, and  epidemiological information. The expected result is Negative.  Fact Sheet for Patients:  EntrepreneurPulse.com.au  Fact Sheet for Healthcare Providers:  IncredibleEmployment.be  This test is no t yet approved or cleared by the [place] FDA and  has been authorized for detection and/or diagnosis of SARS-CoV-2 by FDA under an Emergency Use Authorization (EUA). This EUA will remain  in effect (meaning this test can be used) for the duration of the COVID-19 declaration under Section 564(b)(1) of the Act, 21 U.S.C.section 360bbb-3(b)(1), unless the authorization is terminated  or revoked sooner.       Influenza A by PCR NEGATIVE NEGATIVE Final   Influenza B by PCR NEGATIVE NEGATIVE Final    Comment: (NOTE) The Xpert Xpress SARS-CoV-2/FLU/RSV plus assay is intended as an aid in the diagnosis of influenza from Nasopharyngeal swab specimens and should not be used as a sole basis for treatment. Nasal washings and aspirates are unacceptable for Xpert Xpress SARS-CoV-2/FLU/RSV testing.  Fact Sheet for Patients: EntrepreneurPulse.com.au  Fact Sheet for Healthcare Providers: IncredibleEmployment.be  This test is not yet approved or cleared by the [place] FDA and has been authorized for detection and/or diagnosis of SARS-CoV-2 by FDA under an Emergency Use Authorization (EUA). This EUA will remain in effect (meaning this test can be used) for the duration of the COVID-19 declaration under Section 564(b)(1) of the Act, 21 U.S.C. section 360bbb-3(b)(1), unless the authorization is terminated or revoked.  Performed at [facility], [place] [address] [place], [place] [address]      Radiology Studies: No results found.  Scheduled Meds: . carvedilol  25 mg Oral BID WC  . ferrous sulfate  325 mg Oral BID WC  . furosemide  80 mg Intravenous BID  . hydrALAZINE  50 mg Oral Q8H  . hydrocortisone cream    Topical BID  . isosorbide mononitrate  30 mg Oral Daily  . nutrition supplement (JUVEN)  1 packet Oral BID BM  . potassium chloride SA  10 mEq Oral BID  . sodium chloride flush  3 mL Intravenous Q12H   Continuous Infusions:   LOS: 7 days    Time spent: 25 mins    Teale Goodgame, MD [facility]   If 7PM-7AM, please contact night-coverage

## 2020-10-02 NOTE — TOC Transition Note (Signed)
Transition of Care [facility]) - CM/SW Discharge Note   Patient Details  Name: Vanessa [name] MRN: [id number] Date of Birth: 1955-07-16  Transition of Care [facility]) CM/SW Contact:  Vinie Sill, [place] Phone Number: 10/02/2020, 2:53 PM   Clinical Narrative:     Per MD patient is not medically ready for discharge to SNF. CSW updated SNF- possible admit tomorrow.  Thurmond Butts, MSW, LCSW Clinical Social Worker     Barriers to Discharge: Continued Medical Work up   Patient Goals and CMS Choice Patient states their goals for this hospitalization and ongoing recovery are:: to go to SNF CMS Medicare.gov Compare Post Acute Care list provided to:: Patient Choice offered to / list presented to : Patient  Discharge Placement                       Discharge Plan and Services In-house Referral: Clinical Social Work                                   Social Determinants of Health ([place]) Interventions     Readmission Risk Interventions Readmission Risk Prevention Plan 11/08/2019 10/21/2019 09/25/2019  Transportation Screening Complete Complete Complete  PCP or Specialist Appt within 3-5 Days - - Not Complete  Not Complete comments - - plan for SNF  [place] or [place] - - Complete  Social Work Consult for [place] Planning/Counseling - - Complete  Palliative Care Screening - - Not Applicable  Medication Review Press photographer) Complete Referral to Pharmacy Referral to Pharmacy  PCP or Specialist appointment within 3-5 days of discharge Complete Complete -  [place] or Home Care Consult Complete (No Data) -  SW Recovery Care/Counseling Consult Complete Complete -  Palliative Care Screening Not Applicable Not Applicable -  Skilled Nursing Facility Complete Complete -  Some recent data might be hidden

## 2020-10-03 ENCOUNTER — Inpatient Hospital Stay (HOSPITAL_COMMUNITY): Payer: Medicare Other

## 2020-10-03 DIAGNOSIS — C541 Malignant neoplasm of endometrium: Secondary | ICD-10-CM

## 2020-10-03 DIAGNOSIS — Z7189 Other specified counseling: Secondary | ICD-10-CM

## 2020-10-03 DIAGNOSIS — I5043 Acute on chronic combined systolic (congestive) and diastolic (congestive) heart failure: Secondary | ICD-10-CM | POA: Diagnosis not present

## 2020-10-03 DIAGNOSIS — N179 Acute kidney failure, unspecified: Secondary | ICD-10-CM | POA: Diagnosis not present

## 2020-10-03 LAB — BASIC METABOLIC PANEL
Anion gap: 11 (ref 5–15)
BUN: 109 mg/dL — ABNORMAL HIGH (ref 8–23)
CO2: 23 mmol/L (ref 22–32)
Calcium: 8.4 mg/dL — ABNORMAL LOW (ref 8.9–10.3)
Chloride: 109 mmol/L (ref 98–111)
Creatinine, Ser: 3.98 mg/dL — ABNORMAL HIGH (ref 0.44–1.00)
GFR, Estimated: 12 mL/min — ABNORMAL LOW (ref >60)
Glucose, Bld: 72 mg/dL (ref 70–99)
Potassium: 4.2 mmol/L (ref 3.5–5.1)
Sodium: 143 mmol/L (ref 135–145)

## 2020-10-03 LAB — CBC
HCT: 33.1 % — ABNORMAL LOW (ref 36.0–46.0)
Hemoglobin: 10.4 g/dL — ABNORMAL LOW (ref 12.0–15.0)
MCH: 28.5 pg (ref 26.0–34.0)
MCHC: 31.4 g/dL (ref 30.0–36.0)
MCV: 90.7 fL (ref 80.0–100.0)
Platelets: 109 10*3/uL — ABNORMAL LOW (ref 150–400)
RBC: 3.65 MIL/uL — ABNORMAL LOW (ref 3.87–5.11)
RDW: 17.9 % — ABNORMAL HIGH (ref 11.5–15.5)
WBC: 2.9 10*3/uL — ABNORMAL LOW (ref 4.0–10.5)
nRBC: 4.2 % — ABNORMAL HIGH (ref 0.0–0.2)

## 2020-10-03 LAB — PHOSPHORUS: Phosphorus: 5.5 mg/dL — ABNORMAL HIGH (ref 2.5–4.6)

## 2020-10-03 LAB — MAGNESIUM: Magnesium: 2.2 mg/dL (ref 1.7–2.4)

## 2020-10-03 LAB — GLUCOSE, CAPILLARY: Glucose-Capillary: 88 mg/dL (ref 70–99)

## 2020-10-03 MED ORDER — SODIUM CHLORIDE 0.9 % IV SOLN
1.0000 g | INTRAVENOUS | Status: DC
  Administered 2020-10-03 – 2020-10-06 (×4): 1 g via INTRAVENOUS
  Filled 2020-10-03 (×4): qty 10

## 2020-10-03 MED ORDER — CAMPHOR-MENTHOL 0.5-0.5 % EX LOTN
TOPICAL_LOTION | CUTANEOUS | Status: DC | PRN
  Administered 2020-10-08: 1 via TOPICAL
  Filled 2020-10-03: qty 222

## 2020-10-03 NOTE — Progress Notes (Addendum)
[facility] Progress Note    Assessment/ Plan:   AKI on CKD4, worsening, now oliguric: Likely secondary to cardiorenal -seems that she is refractory to diuretics at this time and is exhibiting early uremic symptoms, diuresis at the direction of cardiology. May benefit from an inotrope.  -appreciate palliative care's assistance: patient is in favor of a time trial of dialysis. She also does report that she may just want it long term. I clarified with her what a time trial entails and what would happen at the end of her time trial if she does not have any recovery of her kidney function. I expressed my concerns of her quality of life being affected given her ongoing endometrial carcinoma and chronic combined CHF if dialysis is to be long-term -Given hypervolemia which has not been improving with diuresis and early uremic symptoms, she is heading down the route of needing renal replacement therapy. Will consult IR for a temp cath placement. Tentatively planning on HD Monday (or tomorrow if there is a change in clinical status) -abx for uti per primary -please bladder scan qshift, given urinary retention will order another renal u/s -Continue to monitor daily Cr, Dose meds for GFR<15 -Monitor Daily I/Os, Daily weight  -Maintain MAP>65 for optimal renal perfusion.  -Agree with holding ACE-I, avoid further nephrotoxins including NSAIDS, Morphine.  Unless absolutely necessary, avoid CT with contrast and/or MRI with gadolinium.     Acute on chronic combined diastolic and systolic heart failure -Cardiology on board, appreciate assistance with diuretics.  Agree with metolazone.  inc'ed her Lasix to 120 twice daily, HD plan as above  A flutter -Not a candidate for cardioversion, anticoagulation  UTI -on rocephin, ucx ordered  Endometrial carcinoma -Nonoperative management, palliative management  Hypertension -BP is acceptable at the moment  Thrombocytopenia -Monitoring, per  primary  Anemia due to chronic disease and history of blood loss: -Transfuse for Hgb<7 g/dL -hgb stable, if lower then please check iron panel+ferritin  Uncontrolled Diabetes Mellitus Type 2 with Hyperglycemia -Management per primary  Subjective:   No acute events. Has been having issues with urinary retention on bladder scans but voiding. She does report dysuria, UA indicative of a UTI hence rocephin started. She does endorse nausea, pruritis, and shaking.   Objective:   BP (!) 120/101 (BP Location: Right Arm)   Pulse (!) 108   Temp (!) 97.3 F (36.3 C) (Oral)   Resp 20   Ht 4\' 11"  (1.499 m)   Wt 100.6 kg   LMP 03/17/2015   SpO2 100%   BMI 44.78 kg/m   Intake/Output Summary (Last 24 hours) at 10/03/2020 1328 Last data filed at 10/03/2020 1000 Gross per 24 hour  Intake 3 ml  Output 550 ml  Net -547 ml   Weight change: 0.136 kg  Physical Exam: Gen:nad, sitting up in bed HEENT: poor dentition CVS:s1s2, rrr Resp:cta bl MHD:QQIW, nt/nd Ext:3+ pitting edema bl le's up to thighs Neuro: mild asterixis, speech clear and coherent, moves all ext spontaneously  Imaging: No results found.  Labs: BMET Recent Labs  Lab 09/27/20 0216 09/28/20 0505 09/29/20 0234 09/30/20 0300 10/01/20 0232 10/02/20 0305 10/03/20 0111  NA 143 143 143 142 142 143 143  K 3.5 3.7 3.5 3.8 3.9 3.9 4.2  CL 104 107 106 106 107 106 109  CO2 23 24 27 24 25 24 23   GLUCOSE 109* 93 110* 138* 83 80 72  BUN 85* 89* 94* 98* 100* 105* 109*  CREATININE 3.71* 3.78* 3.76*  3.77* 3.81* 3.98* 3.98*  CALCIUM 7.8* 8.0* 8.0* 8.2* 8.2* 8.4* 8.4*  PHOS  --   --  5.7*  --   --   --  5.5*   CBC Recent Labs  Lab 09/30/20 0300 10/01/20 0232 10/02/20 0305 10/03/20 0111  WBC 4.0 3.4* 2.5* 2.9*  HGB 10.8* 10.4* 10.4* 10.4*  HCT 33.3* 31.8* 31.8* 33.1*  MCV 90.0 88.8 88.8 90.7  PLT 103* 97* 103* 109*    Medications:    . carvedilol  25 mg Oral BID WC  . ferrous sulfate  325 mg Oral BID WC  .  hydrALAZINE  50 mg Oral Q8H  . hydrocortisone cream   Topical BID  . isosorbide mononitrate  30 mg Oral Daily  . nutrition supplement (JUVEN)  1 packet Oral BID BM  . potassium chloride SA  10 mEq Oral BID  . sodium chloride flush  3 mL Intravenous Q12H      Gean Quint, MD [facility] 10/03/2020, 1:28 PM

## 2020-10-03 NOTE — Progress Notes (Signed)
Patient refusing urine culture through staight cath. Dr. Dwyane Dee aware.

## 2020-10-03 NOTE — Progress Notes (Signed)
Bedside report received. Team checks done.  Pt vss. No acute distress noted. No Family at bedside. Call bell with in reach. Pt up to chair. Will assume poc. See charted full assessments.

## 2020-10-03 NOTE — Consult Note (Signed)
Palliative Medicine Inpatient Consult Note  Reason for consult:  AKI on CKD 4,. Endometrial cancer, heart failure- goals of care discussion in relation to RRT.  HPI: Per Progress Note by Dr. Dwyane Dee on 3/25--> "This 65 years old female with PMH significant for chronic combined systolic and diastolic CHF(EF 35 to 33% by TTE 08/08/20), atrial fibrillation not on anticoagulation due to chronic bleeding from endometrial cancer, CKD stage IV, type II DM, hypertension, anemia of chronic disease, history of MSSA bacteremia, stage I endometrial cancer status post palliative radiation presented to the ED from SNF with shortness of breath and peripheral edema. Patient recently admitted from 08/07/2020-08/18/2020 for acute on chronic combined systolic and diastolic CHF exacerbation, AKI on CKD stage IV, and new onset A. fib with RVR. Patient was managed with IV diuresis before transition to oral torsemide. A. fib/flutter was controlled with Coreg. Anticoagulation not started due to high risk of bleeding. Patient was ultimately discharged to SNF with palliative care. Patient is admitted for acute on chronic CHF exacerbation,  started on diuresis.  Cardiology and nephrology has been consulted."    Clinical Assessment/Goals of Care: I have reviewed medical records including EPIC notes, labs and imaging, received report from bedside RN Bonnita Nasuti, and assessed the patient.    I met with Ms. Kosiba to further discuss diagnosis prognosis, [place], EOL wishes, disposition and options.   I introduced Palliative Medicine as specialized medical care for people living with serious illness. It focuses on providing relief from the symptoms and stress of a serious illness. The goal is to improve quality of life for both the patient and the family.  A detailed discussion was had today regarding advanced directives.  Concepts specific to code status, artifical feeding and hydration, continued IV antibiotics and rehospitalization was  had.  The difference between a aggressive medical intervention path  and a palliative comfort care path for this patient at this time was had. Values and goals of care important to patient and family were attempted to be elicited  Ms. Loscalzo grew up in [facility] area and has recently been in [place] area living with her Daughter prior to last admission. Her husband lives in a separate home here. She had 4 children, one who was premature and died. Two sons live in the [place] area and her daughter Vernie Shanks is here. Her daughter has 4 children ages 18-7. She was helping provide ADLs with Ms. Hoel before her last admission. She has been living at [facility] for rehab since her February discharge. She was followed by [facility] for outpatient Palliative services.  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  She is of [employer] religious faith. She verbalizes understanding of her diseases and progress. Her biggest fear is not being able to do for herself. She states her daughter is small and not able to lift her.  Symptom concerns include pain (priobably bladder sphincter spasms)  related to catheter placement for urine. She is "down" about not being able to do for herself. She has breathlessness at times but states it improves when being able to sit up out of bed. Swelling of legs, fatigue and pruritis are also concerns. She denies nausea and vomiting.  We validated her DNR/DNI status. She has an advance directive. She would not want a feeding tube if she was not able to eat. She does desire IV Fluids, antibiotics and would like to trial RRT if this is an option. If this does not improve her condition she is  open to further conversation.  Discussed the importance of continued conversation with family and their medical providers regarding overall plan of care and treatment options, ensuring decisions are within the context of the patients values and GOCs.  Decision Maker: Patient  SUMMARY  OF RECOMMENDATIONS    Code Status/Advance Care Planning:  DNAR/DNI    Symptom Management:     Pain probably related to Bladder spasms with catheterization- if continues ask  Nephrology about one time dose of pyridium considering her renal disease; acetaminophen prn   Itching- Vistaril prn, Sarna lotion   Fatigue- PT, OT   Edema of legs impacting walking- continue diuresis per nephrology  Palliative Prophylaxis:   Nausea- ondansetron prn  Constipation- polyethylene glycol, preparation H for hemorrhoids  Low temeprature- warming blanket  Endometrial cancer- treat any symptoms with palliative management   Psycho-social/Spiritual:   Desire for further Chaplaincy support: Yes, consult placed  Additional Recommendations:  Treat UTI, CRRT if indicated.    Prognosis: Patient has multisystem organ dysfunction with cardiorenal syndrome. If RRT can improve kidney function and peripheral edema she may be  a candidate for rehab which can improve her functional score.   Discharge Planning: She would like to return to [facility] for rehab.    Vitals with BMI 10/03/2020 10/03/2020 10/02/2020  Height - - -  Weight 221 lbs 11 oz - -  BMI 03.50 - -  Systolic 093 818 299  Diastolic 371 86 94  Pulse 109 111 111     PPS: 50%   This conversation/these recommendations were discussed with patient primary care team, Dr. Dwyane Dee via secure chat.  Thank you for the opportunity to participate in the care of this patient and family.   Time In:7:10 Time Out: 8:20 Total Time: 70 minutes Greater than 50%  of this time was spent counseling and coordinating care related to the above assessment and plan.  Lindell Spar, NP [facility] Palliative Medicine Team Team Cell Phone: [phone] Please utilize secure chat with additional questions, if there is no response within 30 minutes please call the above phone number  Palliative Medicine Team providers are available by phone from 7am to  7pm daily and can be reached through the team cell phone.  Should this patient require assistance outside of these hours, please call the patient's attending physician.

## 2020-10-03 NOTE — Progress Notes (Signed)
PROGRESS NOTE    Vanessa [name]  ATF:[id number] DOB: Sep 30, 1955 DOA: 09/24/2020 PCP: Patient, No Pcp Per   Brief Narrative: This 65 years old female with PMH significant for chronic combined systolic and diastolic CHF(EF 35 to 27% by TTE 08/08/20), atrial fibrillation not on anticoagulation due to chronic bleeding from endometrial cancer, CKD stage IV, type II DM, hypertension, anemia of chronic disease, history of MSSA bacteremia, stage I endometrial cancer status post palliative radiation presented to the ED from SNF with shortness of breath and peripheral edema. Patient recently admitted from 08/07/2020-08/18/2020 for acute on chronic combined systolic and diastolic CHF exacerbation, AKI on CKD stage IV, and new onset A. fib with RVR.  Patient was managed with IV diuresis before transition to oral torsemide. A. fib/flutter was controlled with Coreg.  Anticoagulation not started due to high risk of bleeding.  Patient was ultimately discharged to SNF with palliative care. Patient is admitted for acute on chronic CHF exacerbation,  started on diuresis.  Cardiology and nephrology has been consulted.  Patient continued to remain fluid overloaded.  Patient was started on metolazone, Patient is having cardiorenal syndrome,  Nephrology is planning short-term dialysis trial.  Assessment & Plan:   Principal Problem:   Acute on chronic combined systolic (congestive) and diastolic (congestive) heart failure (HCC) Active Problems:   HTN (hypertension)   Thrombocytopenia (HCC)   Acute kidney injury superimposed on CKD (HCC)   Acute on chronic combined systolic and diastolic heart failure ([place]) - She presented with progressive peripheral edema, orthopnea, DOE, BNP 1724. - 2D echo 07/2020 had shown EF of 35 to 40%, -Continued IV Lasix 40 mg twice daily, received Lasix 60 mg IV x1 in ED -Continue strict I's and O's and daily weights -Continue Coreg, not on ACE/ARB/spironolactone due to renal  dysfunction 09/27/2020: Increased the dose of IV Lasix from 40 mg twice daily to 80 mg twice daily.   Continue to monitor renal function and electrolytes.  Strict I's and O's.  Cardiac diet.  Avoid excessive sodium intake.   3/25: She continued to remain fluid overloaded, cardiology consulted. 3/25 : Patient was started on metolazone, Continue Lasix 120 mg BID, -Patient is having cardiorenal syndrome,  Nephrology is planning short-term dialysis trial.    Active problems Acute kidney injury superimposed on CKD stage IV -Likely worsened due to acute on chronic CHF, baseline creatinine 3.6-3.7 -Presented with creatinine of 4.3, continue diuresis and monitor renal function. -Creatinine improving, 3.98 today Continue to monitor renal function, electrolytes, I's and O's.  3/25: Nephrology consulted,  Seems refractory to diuretics at this time and showing early uremic symptoms,  Patient is heading towards RRT,  IR consulted for [facility].    Paroxysmal atrial fibrillation -Continue Coreg 25 mg twice daily, HR slightly elevated in low 100's, in atrial fibrillation -Not on anticoagulation due to high risk of bleeding.   Essential hypertension -BP stable, continue Coreg, hydralazine, Imdur. -Continue IV Lasix   Thrombocytopenia -Unclear etiology, mild,  no active bleeding, monitor counts.   History of endometrial cancer with chronic abnormal uterine bleeding Anemia due to chronic blood loss and CKD stage IV -Baseline ~9 Currently stable, 10.6- 10.8     Obesity Estimated body mass index is 44.17 kg/m as calculated from the following:   Height as of this encounter: 4\' 11"  (1.499 m).   Weight as of this encounter: 99.2 kg.   DVT prophylaxis: SCDs Code Status:DNR Family Communication: No family at bed side. Disposition Plan:   Status is: Inpatient  Remains inpatient appropriate because:Inpatient level of care appropriate due to severity of illness   Dispo: The patient is from: Home               Anticipated d/c is to: SNF               Patient currently is  Not medically stable for dc   Difficult to place patient No  Consultants:    None  Procedures:  Antimicrobials:  Anti-infectives (From admission, onward)   Start     Dose/Rate Route Frequency Ordered Stop   10/03/20 0930  cefTRIAXone (ROCEPHIN) 1 g in sodium chloride 0.9 % 100 mL IVPB        1 g 200 mL/hr over 30 Minutes Intravenous Every 24 hours 10/03/20 6213        Subjective: Patient was seen and examined at bedside.  Overnight events noted.   She reports feeling better, states she wants to have dialysis to get better,  She denies any chest pain or shortness of breath. Leg swelling improving.  Objective: Vitals:   10/03/20 0043 10/03/20 0500 10/03/20 0824 10/03/20 1204  BP: 109/86 (!) 129/100 (!) 133/101 (!) 120/101  Pulse: (!) 111 (!) 109 (!) 109 (!) 108  Resp: 16 14 16 20   Temp: (!) 97.2 F (36.2 C) (!) 97.3 F (36.3 C)  (!) 97.3 F (36.3 C)  TempSrc: Rectal Oral  Oral  SpO2: 100% 100% 100% 100%  Weight:  100.6 kg    Height:        Intake/Output Summary (Last 24 hours) at 10/03/2020 1459 Last data filed at 10/03/2020 1000 Gross per 24 hour  Intake 3 ml  Output 550 ml  Net -547 ml   Filed Weights   10/01/20 1022 10/02/20 0625 10/03/20 0500  Weight: 100.4 kg 93.5 kg 100.6 kg    Examination:  General exam: Appears calm and comfortable , not in any acute distress Respiratory system: Clear to auscultation. Respiratory effort normal. Cardiovascular system: S1 & S2 heard, RRR. No JVD, murmurs, rubs, gallops or clicks. No pedal edema. Gastrointestinal system: Abdomen is nondistended, soft and nontender. No organomegaly or masses felt. Normal bowel sounds heard. Central nervous system: Alert and oriented. No focal neurological deficits. Extremities: Symmetric 5 x 5 power.  Bilateral pedal edema++ Skin: No rashes, lesions or ulcers Psychiatry: Judgement and insight appear normal. Mood &  affect appropriate.     Data Reviewed: I have personally reviewed following labs and imaging studies  CBC: Recent Labs  Lab 09/29/20 0234 09/30/20 0300 10/01/20 0232 10/02/20 0305 10/03/20 0111  WBC 2.4* 4.0 3.4* 2.5* 2.9*  HGB 10.6* 10.8* 10.4* 10.4* 10.4*  HCT 33.6* 33.3* 31.8* 31.8* 33.1*  MCV 91.1 90.0 88.8 88.8 90.7  PLT 98* 103* 97* 103* 086*   Basic Metabolic Panel: Recent Labs  Lab 09/29/20 0234 09/30/20 0300 10/01/20 0232 10/02/20 0305 10/03/20 0111  NA 143 142 142 143 143  K 3.5 3.8 3.9 3.9 4.2  CL 106 106 107 106 109  CO2 27 24 25 24 23   GLUCOSE 110* 138* 83 80 72  BUN 94* 98* 100* 105* 109*  CREATININE 3.76* 3.77* 3.81* 3.98* 3.98*  CALCIUM 8.0* 8.2* 8.2* 8.4* 8.4*  MG 2.1  --   --   --  2.2  PHOS 5.7*  --   --   --  5.5*   GFR: Estimated Creatinine Clearance: 14.5 mL/min (A) (by C-G formula based on SCr of 3.98 mg/dL (H)). Liver Function Tests:  Recent Labs  Lab 09/27/20 0216 09/28/20 0505  AST  --  14*  ALT  --  8  ALKPHOS  --  56  BILITOT  --  0.7  PROT  --  6.9  ALBUMIN 2.8* 2.8*   No results for input(s): LIPASE, AMYLASE in the last 168 hours. No results for input(s): AMMONIA in the last 168 hours. Coagulation Profile: No results for input(s): INR, PROTIME in the last 168 hours. Cardiac Enzymes: No results for input(s): CKTOTAL, CKMB, CKMBINDEX, TROPONINI in the last 168 hours. BNP (last 3 results) No results for input(s): PROBNP in the last 8760 hours. HbA1C: No results for input(s): HGBA1C in the last 72 hours. CBG: Recent Labs  Lab 10/02/20 0059 10/02/20 1556 10/03/20 0819  GLUCAP 99 136* 88   Lipid Profile: No results for input(s): CHOL, HDL, LDLCALC, TRIG, CHOLHDL, LDLDIRECT in the last 72 hours. Thyroid Function Tests: Recent Labs    10/02/20 0305  TSH 4.834*   Anemia Panel: No results for input(s): VITAMINB12, FOLATE, FERRITIN, TIBC, IRON, RETICCTPCT in the last 72 hours. Sepsis Labs: No results for input(s):  PROCALCITON, LATICACIDVEN in the last 168 hours.  Recent Results (from the past 240 hour(s))  Resp Panel by RT-PCR (Flu A&B, Covid) Nasopharyngeal Swab     Status: None   Collection Time: 09/24/20  7:31 PM   Specimen: Nasopharyngeal Swab; Nasopharyngeal(NP) swabs in vial transport medium  Result Value Ref Range Status   SARS Coronavirus 2 by RT PCR NEGATIVE NEGATIVE Final    Comment: (NOTE) SARS-CoV-2 target nucleic acids are NOT DETECTED.  The SARS-CoV-2 RNA is generally detectable in upper respiratory specimens during the acute phase of infection. The lowest concentration of SARS-CoV-2 viral copies this assay can detect is 138 copies/mL. A negative result does not preclude SARS-Cov-2 infection and should not be used as the sole basis for treatment or other patient management decisions. A negative result may occur with  improper specimen collection/handling, submission of specimen other than nasopharyngeal swab, presence of viral mutation(s) within the areas targeted by this assay, and inadequate number of viral copies(<138 copies/mL). A negative result must be combined with clinical observations, patient history, and epidemiological information. The expected result is Negative.  Fact Sheet for Patients:  EntrepreneurPulse.com.au  Fact Sheet for Healthcare Providers:  IncredibleEmployment.be  This test is no t yet approved or cleared by the [place] FDA and  has been authorized for detection and/or diagnosis of SARS-CoV-2 by FDA under an Emergency Use Authorization (EUA). This EUA will remain  in effect (meaning this test can be used) for the duration of the COVID-19 declaration under Section 564(b)(1) of the Act, 21 U.S.C.section 360bbb-3(b)(1), unless the authorization is terminated  or revoked sooner.       Influenza A by PCR NEGATIVE NEGATIVE Final   Influenza B by PCR NEGATIVE NEGATIVE Final    Comment: (NOTE) The Xpert Xpress  SARS-CoV-2/FLU/RSV plus assay is intended as an aid in the diagnosis of influenza from Nasopharyngeal swab specimens and should not be used as a sole basis for treatment. Nasal washings and aspirates are unacceptable for Xpert Xpress SARS-CoV-2/FLU/RSV testing.  Fact Sheet for Patients: EntrepreneurPulse.com.au  Fact Sheet for Healthcare Providers: IncredibleEmployment.be  This test is not yet approved or cleared by the [place] FDA and has been authorized for detection and/or diagnosis of SARS-CoV-2 by FDA under an Emergency Use Authorization (EUA). This EUA will remain in effect (meaning this test can be used) for the duration of the COVID-19  declaration under Section 564(b)(1) of the Act, 21 U.S.C. section 360bbb-3(b)(1), unless the authorization is terminated or revoked.  Performed at [facility], [place] [address], [place], [place] [address]   MRSA PCR Screening     Status: Abnormal   Collection Time: 09/26/20 10:14 AM   Specimen: Nasopharyngeal  Result Value Ref Range Status   MRSA by PCR POSITIVE (A) NEGATIVE Final    Comment:        The GeneXpert MRSA Assay (FDA approved for NASAL specimens only), is one component of a comprehensive MRSA colonization surveillance program. It is not intended to diagnose MRSA infection nor to guide or monitor treatment for MRSA infections. RESULT CALLED TO, READ BACK BY AND VERIFIED WITH: F,VEATTH @1317  09/26/20 EB Performed at [facility], [place] [address], [place], [place] [address]   Resp Panel by RT-PCR (Flu A&B, Covid) Nasopharyngeal Swab     Status: None   Collection Time: 10/01/20 11:03 AM   Specimen: Nasopharyngeal Swab; Nasopharyngeal(NP) swabs in vial transport medium  Result Value Ref Range Status   SARS Coronavirus 2 by RT PCR NEGATIVE NEGATIVE Final    Comment: (NOTE) SARS-CoV-2 target nucleic acids are NOT DETECTED.  The SARS-CoV-2 RNA is generally detectable in  upper respiratory specimens during the acute phase of infection. The lowest concentration of SARS-CoV-2 viral copies this assay can detect is 138 copies/mL. A negative result does not preclude SARS-Cov-2 infection and should not be used as the sole basis for treatment or other patient management decisions. A negative result may occur with  improper specimen collection/handling, submission of specimen other than nasopharyngeal swab, presence of viral mutation(s) within the areas targeted by this assay, and inadequate number of viral copies(<138 copies/mL). A negative result must be combined with clinical observations, patient history, and epidemiological information. The expected result is Negative.  Fact Sheet for Patients:  EntrepreneurPulse.com.au  Fact Sheet for Healthcare Providers:  IncredibleEmployment.be  This test is no t yet approved or cleared by the [place] FDA and  has been authorized for detection and/or diagnosis of SARS-CoV-2 by FDA under an Emergency Use Authorization (EUA). This EUA will remain  in effect (meaning this test can be used) for the duration of the COVID-19 declaration under Section 564(b)(1) of the Act, 21 U.S.C.section 360bbb-3(b)(1), unless the authorization is terminated  or revoked sooner.       Influenza A by PCR NEGATIVE NEGATIVE Final   Influenza B by PCR NEGATIVE NEGATIVE Final    Comment: (NOTE) The Xpert Xpress SARS-CoV-2/FLU/RSV plus assay is intended as an aid in the diagnosis of influenza from Nasopharyngeal swab specimens and should not be used as a sole basis for treatment. Nasal washings and aspirates are unacceptable for Xpert Xpress SARS-CoV-2/FLU/RSV testing.  Fact Sheet for Patients: EntrepreneurPulse.com.au  Fact Sheet for Healthcare Providers: IncredibleEmployment.be  This test is not yet approved or cleared by the [place] FDA and has been  authorized for detection and/or diagnosis of SARS-CoV-2 by FDA under an Emergency Use Authorization (EUA). This EUA will remain in effect (meaning this test can be used) for the duration of the COVID-19 declaration under Section 564(b)(1) of the Act, 21 U.S.C. section 360bbb-3(b)(1), unless the authorization is terminated or revoked.  Performed at [facility], [place] [address], [place], [place] [address]      Radiology Studies: No results found.  Scheduled Meds: . carvedilol  25 mg Oral BID WC  . ferrous sulfate  325 mg Oral BID WC  .  hydrALAZINE  50 mg Oral Q8H  . hydrocortisone cream   Topical BID  . isosorbide mononitrate  30 mg Oral Daily  . nutrition supplement (JUVEN)  1 packet Oral BID BM  . potassium chloride SA  10 mEq Oral BID  . sodium chloride flush  3 mL Intravenous Q12H   Continuous Infusions: . cefTRIAXone (ROCEPHIN)  IV 1 g (10/03/20 1130)  . furosemide 120 mg (10/03/20 1000)     LOS: 8 days    Time spent: 25 mins    PARDEEP KUMAR, MD [facility]   If 7PM-7AM, please contact night-coverage

## 2020-10-04 ENCOUNTER — Inpatient Hospital Stay (HOSPITAL_COMMUNITY): Payer: Medicare Other

## 2020-10-04 ENCOUNTER — Encounter (HOSPITAL_COMMUNITY): Payer: Self-pay | Admitting: Internal Medicine

## 2020-10-04 DIAGNOSIS — I5043 Acute on chronic combined systolic (congestive) and diastolic (congestive) heart failure: Secondary | ICD-10-CM | POA: Diagnosis not present

## 2020-10-04 HISTORY — PX: IR FLUORO GUIDE CV LINE LEFT: IMG2282

## 2020-10-04 HISTORY — PX: IR US GUIDE VASC ACCESS LEFT: IMG2389

## 2020-10-04 LAB — BASIC METABOLIC PANEL
Anion gap: 12 (ref 5–15)
BUN: 115 mg/dL — ABNORMAL HIGH (ref 8–23)
CO2: 25 mmol/L (ref 22–32)
Calcium: 8.3 mg/dL — ABNORMAL LOW (ref 8.9–10.3)
Chloride: 105 mmol/L (ref 98–111)
Creatinine, Ser: 4.11 mg/dL — ABNORMAL HIGH (ref 0.44–1.00)
GFR, Estimated: 11 mL/min — ABNORMAL LOW (ref >60)
Glucose, Bld: 94 mg/dL (ref 70–99)
Potassium: 3.6 mmol/L (ref 3.5–5.1)
Sodium: 142 mmol/L (ref 135–145)

## 2020-10-04 MED ORDER — HEPARIN SODIUM (PORCINE) 1000 UNIT/ML IJ SOLN
INTRAMUSCULAR | Status: AC
  Filled 2020-10-04: qty 1

## 2020-10-04 MED ORDER — LIDOCAINE HCL 1 % IJ SOLN
INTRAMUSCULAR | Status: AC
  Filled 2020-10-04: qty 20

## 2020-10-04 MED ORDER — CHLORHEXIDINE GLUCONATE CLOTH 2 % EX PADS
6.0000 | MEDICATED_PAD | Freq: Every day | CUTANEOUS | Status: DC
  Administered 2020-10-04 – 2020-10-14 (×10): 6 via TOPICAL

## 2020-10-04 MED ORDER — HEPARIN SODIUM (PORCINE) 1000 UNIT/ML IJ SOLN
INTRAMUSCULAR | Status: AC | PRN
  Administered 2020-10-04: 2.8 mL via INTRAVENOUS

## 2020-10-04 MED ORDER — LIDOCAINE HCL 1 % IJ SOLN
INTRAMUSCULAR | Status: AC | PRN
Start: 2020-10-04 — End: 2020-10-04
  Administered 2020-10-04 (×2): 10 mL
  Administered 2020-10-04: 5 mL

## 2020-10-04 NOTE — Procedures (Signed)
Interventional Radiology Procedure:   Indications: Acute on chronic kidney disease  Procedure: Placement of non-tunneled dialysis catheter  Findings: Left jugular catheter, tip at SVC/RA junction  Complications: None     EBL: less than 10 ml  Plan:  Catheter is ready to use.    Devlin Brink R. Anselm Pancoast, MD  Pager: [phone]

## 2020-10-04 NOTE — Progress Notes (Signed)
[facility] Progress Note    Assessment/ Plan:   AKI on CKD4, worsening, now oliguric: Likely secondary to cardiorenal -seems that she is refractory to diuretics at this time and is exhibiting early uremic symptoms, diuresis at the direction of cardiology. May benefit from an inotrope.  -appreciate palliative care's assistance: patient is in favor of a time trial of dialysis. She also does report that she may just want it long term. I had clarified with her what a time trial entails and what would happen at the end of her time trial if she does not have any recovery of her kidney function. I had also expressed my concerns of her quality of life being affected given her ongoing endometrial carcinoma and chronic combined CHF if dialysis is to be long-term -Given hypervolemia which has not been improving with diuresis and early uremic symptoms, she will need renal replacement therapy. Appreciate IR's assistance with temp line placement today 3/27. Plan for IHD tomorrow, modified slow start protocol. Risks of RRT discussed with patient on 3/26 and she agrees to move forward -abx for uti per primary -repeat renal u/s without obstruction -Continue to monitor daily Cr, Dose meds for GFR<15 -Monitor Daily I/Os, Daily weight  -Maintain MAP>65 for optimal renal perfusion.  -Agree with holding ACE-I, avoid further nephrotoxins including NSAIDS, Morphine.  Unless absolutely necessary, avoid CT with contrast and/or MRI with gadolinium.     Acute on chronic combined diastolic and systolic heart failure -Cardiology on board, appreciate assistance with diuretics.  inc'ed her Lasix to 120 twice daily, HD plan as above  A flutter -Not a candidate for cardioversion, anticoagulation  UTI -on rocephin, ucx pending  Endometrial carcinoma -Nonoperative management, palliative management  Hypertension -BP is acceptable at the moment  Thrombocytopenia -Monitoring, per primary  Anemia  due to chronic disease and history of blood loss: -Transfuse for Hgb<7 g/dL -hgb stable, if lower then please check iron panel+ferritin  Uncontrolled Diabetes Mellitus Type 2 with Hyperglycemia -Management per primary  Left Renal Atrophy -chronic, no intervention at the moment for this  Subjective:   No acute events. S/p hd temp line placement with ir today. Intermittently brady on flowsheet? Patient still endorses pruritis, nausea, and low appetite. She reports being sleepy since her procedure.   Objective:   BP 133/83 (BP Location: Left Arm)   Pulse (!) 50   Temp (!) 97.3 F (36.3 C) (Oral)   Resp 13   Ht 4\' 11"  (1.499 m)   Wt 101.4 kg   LMP 03/17/2015   SpO2 100%   BMI 45.16 kg/m   Intake/Output Summary (Last 24 hours) at 10/04/2020 1428 Last data filed at 10/04/2020 0910 Gross per 24 hour  Intake 525 ml  Output 550 ml  Net -25 ml   Weight change: 0.862 kg  Physical Exam: Gen:nad, sitting up in bed HEENT: poor dentition CVS:s1s2, rrr Resp:cta bl OBS:JGGE, nt/nd Ext:3+ pitting edema bl le's up to thighs Neuro: awake, alert, mild asterixis, speech clear and coherent, moves all ext spontaneously  Imaging: US RENAL  Result Date: 10/03/2020 CLINICAL DATA:  Acute kidney injury. EXAM: RENAL / URINARY TRACT ULTRASOUND COMPLETE COMPARISON:  Renal ultrasound 08/09/2020.  CT 11/07/2019 FINDINGS: Right Kidney: Renal measurements: 8.8 x 4.1 x 4.4 cm = volume: 84 mL. No hydronephrosis. There is increased renal parenchymal echogenicity. No evidence of focal lesion or stone. Left Kidney: Renal measurements: 6.7 x 4.1 x 3.3 cm = volume: 47 mL. Kidney is chronically atrophic, difficult to visualize with  ultrasound. No evidence of hydronephrosis or focal renal abnormality. Bladder: Partially distended. Appears normal for degree of bladder distention. Other: Moderate volume abdominopelvic ascites. Technically limited exam due to habitus. IMPRESSION: 1. Chronic left renal atrophy. 2. No  obstructive uropathy. Mild increased right renal parenchymal echogenicity most consistent with chronic medical renal disease. 3. Moderate abdominopelvic ascites. Electronically Signed   By: Keith Rake M.D.   On: 10/03/2020 16:26   IR Fluoro Guide CV Line Left  Result Date: 10/04/2020 INDICATION: 65 year old with acute on chronic kidney disease. EXAM: FLUOROSCOPIC AND ULTRASOUND GUIDED PLACEMENT OF A NON-TUNNELED DIALYSIS CATHETER Physician: Stephan Minister. Henn, MD MEDICATIONS: None ANESTHESIA/SEDATION: None FLUOROSCOPY TIME:  Fluoroscopy Time: 1 minutes, 42 seconds, 13 mGy COMPLICATIONS: None immediate. PROCEDURE: The procedure was explained to the patient. The risks and benefits of the procedure were discussed and the patient's questions were addressed. Informed consent was obtained from the patient. The patient was placed supine on the interventional table. Ultrasound confirmed a patent right internal jugular vein. Ultrasound images were obtained for documentation. The right neck was prepped and draped in a sterile fashion. The right neck was anesthetized with 1% lidocaine. Maximal barrier sterile technique was utilized including caps, mask, sterile gowns, sterile gloves, sterile drape, hand hygiene and skin antiseptic. A small incision was made with #11 blade scalpel. A 21 gauge needle directed into the right internal jugular vein with ultrasound guidance. A micropuncture dilator set was placed. Wire would not advance centrally. Micropuncture dilator set was removed and a bandage was placed at the puncture site. Ultrasound confirmed a patent left internal jugular vein. Ultrasound image was saved for documentation. Left side of the neck was prepped and draped in sterile fashion. Maximal barrier sterile technique was utilized including caps, mask, sterile gowns, sterile gloves, sterile drape, hand hygiene and skin antiseptic. Skin was anesthetized using 1% lidocaine. Small incision was made. Using ultrasound  guidance, 21 gauge needle was directed into the left internal jugular vein and wire easily advanced centrally. Micropuncture dilator set was placed. J wire was placed. Tract was dilated. A 20 cm Mahurkar catheter was selected. The catheter was advanced over a wire and positioned at the superior cavoatrial junction. Fluoroscopic images were obtained for documentation. Both dialysis lumens were found to aspirate and flush well. The proper amount of heparin was flushed in both lumens. The central venous lumen was flushed with normal saline. Catheter was sutured to skin. FINDINGS: 1. Right internal jugular vein is patent but there is peripheral wall thickening suggesting old thrombus in this area. Wire would not advance centrally from the right internal jugular vein and not clear if there is a central occlusion or possibly septations from old thrombus. 2. Left internal jugular vein is widely patent. 3. Catheter tip is near the superior cavoatrial junction. IMPRESSION: Successful placement of a left jugular non-tunneled dialysis catheter using ultrasound and fluoroscopic guidance. Electronically Signed   By: Markus Daft M.D.   On: 10/04/2020 13:44   IR US Guide Vasc Access Left  Result Date: 10/04/2020 INDICATION: 65 year old with acute on chronic kidney disease. EXAM: FLUOROSCOPIC AND ULTRASOUND GUIDED PLACEMENT OF A NON-TUNNELED DIALYSIS CATHETER Physician: Stephan Minister. Henn, MD MEDICATIONS: None ANESTHESIA/SEDATION: None FLUOROSCOPY TIME:  Fluoroscopy Time: 1 minutes, 42 seconds, 13 mGy COMPLICATIONS: None immediate. PROCEDURE: The procedure was explained to the patient. The risks and benefits of the procedure were discussed and the patient's questions were addressed. Informed consent was obtained from the patient. The patient was placed supine on the interventional table.  Ultrasound confirmed a patent right internal jugular vein. Ultrasound images were obtained for documentation. The right neck was prepped and draped  in a sterile fashion. The right neck was anesthetized with 1% lidocaine. Maximal barrier sterile technique was utilized including caps, mask, sterile gowns, sterile gloves, sterile drape, hand hygiene and skin antiseptic. A small incision was made with #11 blade scalpel. A 21 gauge needle directed into the right internal jugular vein with ultrasound guidance. A micropuncture dilator set was placed. Wire would not advance centrally. Micropuncture dilator set was removed and a bandage was placed at the puncture site. Ultrasound confirmed a patent left internal jugular vein. Ultrasound image was saved for documentation. Left side of the neck was prepped and draped in sterile fashion. Maximal barrier sterile technique was utilized including caps, mask, sterile gowns, sterile gloves, sterile drape, hand hygiene and skin antiseptic. Skin was anesthetized using 1% lidocaine. Small incision was made. Using ultrasound guidance, 21 gauge needle was directed into the left internal jugular vein and wire easily advanced centrally. Micropuncture dilator set was placed. J wire was placed. Tract was dilated. A 20 cm Mahurkar catheter was selected. The catheter was advanced over a wire and positioned at the superior cavoatrial junction. Fluoroscopic images were obtained for documentation. Both dialysis lumens were found to aspirate and flush well. The proper amount of heparin was flushed in both lumens. The central venous lumen was flushed with normal saline. Catheter was sutured to skin. FINDINGS: 1. Right internal jugular vein is patent but there is peripheral wall thickening suggesting old thrombus in this area. Wire would not advance centrally from the right internal jugular vein and not clear if there is a central occlusion or possibly septations from old thrombus. 2. Left internal jugular vein is widely patent. 3. Catheter tip is near the superior cavoatrial junction. IMPRESSION: Successful placement of a left jugular  non-tunneled dialysis catheter using ultrasound and fluoroscopic guidance. Electronically Signed   By: Markus Daft M.D.   On: 10/04/2020 13:44    Labs: BMET Recent Labs  Lab 09/28/20 0505 09/29/20 0234 09/30/20 0300 10/01/20 0232 10/02/20 0305 10/03/20 0111 10/04/20 0335  NA 143 143 142 142 143 143 142  K 3.7 3.5 3.8 3.9 3.9 4.2 3.6  CL 107 106 106 107 106 109 105  CO2 24 27 24 25 24 23 25   GLUCOSE 93 110* 138* 83 80 72 94  BUN 89* 94* 98* 100* 105* 109* 115*  CREATININE 3.78* 3.76* 3.77* 3.81* 3.98* 3.98* 4.11*  CALCIUM 8.0* 8.0* 8.2* 8.2* 8.4* 8.4* 8.3*  PHOS  --  5.7*  --   --   --  5.5*  --    CBC Recent Labs  Lab 09/30/20 0300 10/01/20 0232 10/02/20 0305 10/03/20 0111  WBC 4.0 3.4* 2.5* 2.9*  HGB 10.8* 10.4* 10.4* 10.4*  HCT 33.3* 31.8* 31.8* 33.1*  MCV 90.0 88.8 88.8 90.7  PLT 103* 97* 103* 109*    Medications:    . carvedilol  25 mg Oral BID WC  . Chlorhexidine Gluconate Cloth  6 each Topical Q0600  . ferrous sulfate  325 mg Oral BID WC  . heparin sodium (porcine)      . hydrALAZINE  50 mg Oral Q8H  . hydrocortisone cream   Topical BID  . isosorbide mononitrate  30 mg Oral Daily  . lidocaine      . nutrition supplement (JUVEN)  1 packet Oral BID BM  . potassium chloride SA  10 mEq Oral BID  .  sodium chloride flush  3 mL Intravenous Q12H      Gean Quint, MD [facility] 10/04/2020, 2:28 PM

## 2020-10-04 NOTE — Progress Notes (Signed)
Pharmacist Heart Failure Core Measure Documentation  Assessment: Vanessa [name] has an EF documented as 35-40% on ECHO by 08/08/2020.  Rationale: Heart failure patients with left ventricular systolic dysfunction (LVSD) and an EF < 40% should be prescribed an angiotensin converting enzyme inhibitor (ACEI) or angiotensin receptor blocker (ARB) at discharge unless a contraindication is documented in the medical record.  This patient is not currently on an ACEI or ARB for HF.  This note is being placed in the record in order to provide documentation that a contraindication to the use of these agents is present for this encounter.  ACE Inhibitor or Angiotensin Receptor Blocker is contraindicated (specify all that apply)  [x]   ACEI allergy AND ARB allergy (angioedema with ACE-I documented) []   Angioedema []   Moderate or severe aortic stenosis []   Hyperkalemia []   Hypotension []   Renal artery stenosis [x]   Worsening renal function, preexisting renal disease or dysfunction   Shauna Hugh, PharmD, [place]  PGY-1 Pharmacy Resident 10/04/2020 2:24 PM  Please check [email or web address] for unit-specific pharmacy phone numbers.

## 2020-10-04 NOTE — Progress Notes (Addendum)
PROGRESS NOTE    Nathalia Wismer  OIZ:[id number] DOB: 10-03-1955 DOA: 09/24/2020 PCP: Patient, No Pcp Per   Brief Narrative: This 65 years old female with PMH significant for chronic combined systolic and diastolic CHF(EF 35 to 33% by TTE 08/08/20), atrial fibrillation not on anticoagulation due to chronic bleeding from endometrial cancer, CKD stage IV, type II DM, hypertension, anemia of chronic disease, history of MSSA bacteremia, stage I endometrial cancer status post palliative radiation presented to the ED from SNF with shortness of breath and peripheral edema. Patient recently admitted from 08/07/2020-08/18/2020 for acute on chronic combined systolic and diastolic CHF exacerbation, AKI on CKD stage IV, and new onset A. fib with RVR.  Patient was managed with IV diuresis before transition to oral torsemide. A. fib/flutter was controlled with Coreg.  Anticoagulation not started due to high risk of bleeding.  Patient was ultimately discharged to SNF with palliative care. Patient is admitted for acute on chronic CHF exacerbation,  started on diuresis.  Cardiology and nephrology has been consulted.  Patient continued to remain fluid overloaded.  Patient was started on metolazone, Patient is having cardiorenal syndrome,  Nephrology is planning short-term dialysis trial.  Assessment & Plan:   Principal Problem:   Acute on chronic combined systolic (congestive) and diastolic (congestive) heart failure (HCC) Active Problems:   HTN (hypertension)   Thrombocytopenia (HCC)   Acute kidney injury superimposed on CKD (HCC)   Acute on chronic combined systolic and diastolic heart failure ([place]) -She presented with progressive peripheral edema, orthopnea, DOE, BNP 1724. -2D echo 07/2020 had shown EF of 35 to 40%, -Continued IV Lasix 40 mg twice daily, received Lasix 60 mg IV x1 in ED -Continue strict I's and O's and daily weights -Continue Coreg, not on ACE/ARB/spironolactone due to renal  dysfunction 09/27/2020: Increased the dose of IV Lasix from 40 mg twice daily to 80 mg twice daily.   Continue to monitor renal function and electrolytes.  Strict I's and O's.  Cardiac diet.  Avoid excessive sodium intake.   3/25: She continued to remain fluid overloaded, cardiology consulted. 3/25 : Patient was started on metolazone, Continue Lasix 120 mg BID, -Patient is having cardiorenal syndrome,  Nephrology is planning short-term dialysis trial. 3/27 :Patient underwent temporary dialysis catheter. Initiation of HD 3/28  Active problems : Acute kidney injury superimposed on CKD stage IV -Likely worsened due to acute on chronic CHF, baseline creatinine 3.6-3.7 -Presented with creatinine of 4.3, continue diuresis and monitor renal function. -Creatinine worsening 4.11 today.  - Continue to monitor renal function, electrolytes, I's and O's.  3/25: Nephrology consulted,  Seems refractory to diuretics at this time and showing early uremic symptoms,   - Patient is heading towards RRT,  IR consulted for Temporary dialysis catheter. - Initiation of hemodialysis 3/28.   Paroxysmal atrial fibrillation -Continue Coreg 25 mg twice daily, HR slightly elevated in low 100's, in atrial fibrillation -Not on anticoagulation due to high risk of bleeding.   Essential hypertension -BP stable, continue Coreg, hydralazine, Imdur. -Continue IV Lasix   Thrombocytopenia -Unclear etiology, mild,  no active bleeding, monitor counts.   History of endometrial cancer with chronic abnormal uterine bleeding Anemia due to chronic blood loss and CKD stage IV -Baseline ~9 Currently stable, 10.6- 10.8   UTI: UA consistent with UTI Continue ceftriaxone.   Obesity Estimated body mass index is 44.17 kg/m as calculated from the following:   Height as of this encounter: 4\' 11"  (1.499 m).   Weight as of this  encounter: 99.2 kg.   DVT prophylaxis: SCDs Code Status:DNR Family Communication: No family at bed  side. Disposition Plan:   Status is: Inpatient  Remains inpatient appropriate because:Inpatient level of care appropriate due to severity of illness   Dispo: The patient is from: Home              Anticipated d/c is to: SNF               Patient currently is  Not medically stable for dc   Difficult to place patient No  Consultants:    None  Procedures:  Antimicrobials:  Anti-infectives (From admission, onward)   Start     Dose/Rate Route Frequency Ordered Stop   10/03/20 0930  cefTRIAXone (ROCEPHIN) 1 g in sodium chloride 0.9 % 100 mL IVPB        1 g 200 mL/hr over 30 Minutes Intravenous Every 24 hours 10/03/20 1751        Subjective: Patient was seen and examined at bedside.  Overnight events noted.   She reports feeling better, states she is going to have hemodialysis tomorrow. She denies any chest pain or shortness of breath. She has Dialysis catheter in Left neck  Objective: Vitals:   10/03/20 2109 10/04/20 0039 10/04/20 0617 10/04/20 1149  BP: (!) 138/99 (!) 130/97  133/83  Pulse:    (!) 50  Resp: 16 18 13 13   Temp: (!) 97.3 F (36.3 C) (!) 97.5 F (36.4 C)  (!) 97.3 F (36.3 C)  TempSrc: Oral Axillary  Oral  SpO2:    100%  Weight:   101.4 kg   Height:        Intake/Output Summary (Last 24 hours) at 10/04/2020 1455 Last data filed at 10/04/2020 0910 Gross per 24 hour  Intake 525 ml  Output 550 ml  Net -25 ml   Filed Weights   10/02/20 0625 10/03/20 0500 10/04/20 0617  Weight: 93.5 kg 100.6 kg 101.4 kg    Examination:  General exam: Appears calm and comfortable , not in any acute distress Respiratory system: Clear to auscultation. Respiratory effort normal. Cardiovascular system: S1 & S2 heard, RRR. No JVD, murmurs, rubs, gallops or clicks. No pedal edema. Gastrointestinal system: Abdomen is nondistended, soft and nontender. No organomegaly or masses felt. Normal bowel sounds heard. Central nervous system: Alert and oriented. No focal neurological  deficits. Extremities: Symmetric 5 x 5 power.  Bilateral pedal edema++ Skin: No rashes, lesions or ulcers Psychiatry: Judgement and insight appear normal. Mood & affect appropriate.     Data Reviewed: I have personally reviewed following labs and imaging studies  CBC: Recent Labs  Lab 09/29/20 0234 09/30/20 0300 10/01/20 0232 10/02/20 0305 10/03/20 0111  WBC 2.4* 4.0 3.4* 2.5* 2.9*  HGB 10.6* 10.8* 10.4* 10.4* 10.4*  HCT 33.6* 33.3* 31.8* 31.8* 33.1*  MCV 91.1 90.0 88.8 88.8 90.7  PLT 98* 103* 97* 103* 025*   Basic Metabolic Panel: Recent Labs  Lab 09/29/20 0234 09/30/20 0300 10/01/20 0232 10/02/20 0305 10/03/20 0111 10/04/20 0335  NA 143 142 142 143 143 142  K 3.5 3.8 3.9 3.9 4.2 3.6  CL 106 106 107 106 109 105  CO2 27 24 25 24 23 25   GLUCOSE 110* 138* 83 80 72 94  BUN 94* 98* 100* 105* 109* 115*  CREATININE 3.76* 3.77* 3.81* 3.98* 3.98* 4.11*  CALCIUM 8.0* 8.2* 8.2* 8.4* 8.4* 8.3*  MG 2.1  --   --   --  2.2  --  PHOS 5.7*  --   --   --  5.5*  --    GFR: Estimated Creatinine Clearance: 14.1 mL/min (A) (by C-G formula based on SCr of 4.11 mg/dL (H)). Liver Function Tests: Recent Labs  Lab 09/28/20 0505  AST 14*  ALT 8  ALKPHOS 56  BILITOT 0.7  PROT 6.9  ALBUMIN 2.8*   No results for input(s): LIPASE, AMYLASE in the last 168 hours. No results for input(s): AMMONIA in the last 168 hours. Coagulation Profile: No results for input(s): INR, PROTIME in the last 168 hours. Cardiac Enzymes: No results for input(s): CKTOTAL, CKMB, CKMBINDEX, TROPONINI in the last 168 hours. BNP (last 3 results) No results for input(s): PROBNP in the last 8760 hours. HbA1C: No results for input(s): HGBA1C in the last 72 hours. CBG: Recent Labs  Lab 10/02/20 0059 10/02/20 1556 10/03/20 0819  GLUCAP 99 136* 88   Lipid Profile: No results for input(s): CHOL, HDL, LDLCALC, TRIG, CHOLHDL, LDLDIRECT in the last 72 hours. Thyroid Function Tests: Recent Labs     10/02/20 0305  TSH 4.834*   Anemia Panel: No results for input(s): VITAMINB12, FOLATE, FERRITIN, TIBC, IRON, RETICCTPCT in the last 72 hours. Sepsis Labs: No results for input(s): PROCALCITON, LATICACIDVEN in the last 168 hours.  Recent Results (from the past 240 hour(s))  Resp Panel by RT-PCR (Flu A&B, Covid) Nasopharyngeal Swab     Status: None   Collection Time: 09/24/20  7:31 PM   Specimen: Nasopharyngeal Swab; Nasopharyngeal(NP) swabs in vial transport medium  Result Value Ref Range Status   SARS Coronavirus 2 by RT PCR NEGATIVE NEGATIVE Final    Comment: (NOTE) SARS-CoV-2 target nucleic acids are NOT DETECTED.  The SARS-CoV-2 RNA is generally detectable in upper respiratory specimens during the acute phase of infection. The lowest concentration of SARS-CoV-2 viral copies this assay can detect is 138 copies/mL. A negative result does not preclude SARS-Cov-2 infection and should not be used as the sole basis for treatment or other patient management decisions. A negative result may occur with  improper specimen collection/handling, submission of specimen other than nasopharyngeal swab, presence of viral mutation(s) within the areas targeted by this assay, and inadequate number of viral copies(<138 copies/mL). A negative result must be combined with clinical observations, patient history, and epidemiological information. The expected result is Negative.  Fact Sheet for Patients:  EntrepreneurPulse.com.au  Fact Sheet for Healthcare Providers:  IncredibleEmployment.be  This test is no t yet approved or cleared by the [place] FDA and  has been authorized for detection and/or diagnosis of SARS-CoV-2 by FDA under an Emergency Use Authorization (EUA). This EUA will remain  in effect (meaning this test can be used) for the duration of the COVID-19 declaration under Section 564(b)(1) of the Act, 21 U.S.C.section 360bbb-3(b)(1), unless the  authorization is terminated  or revoked sooner.       Influenza A by PCR NEGATIVE NEGATIVE Final   Influenza B by PCR NEGATIVE NEGATIVE Final    Comment: (NOTE) The Xpert Xpress SARS-CoV-2/FLU/RSV plus assay is intended as an aid in the diagnosis of influenza from Nasopharyngeal swab specimens and should not be used as a sole basis for treatment. Nasal washings and aspirates are unacceptable for Xpert Xpress SARS-CoV-2/FLU/RSV testing.  Fact Sheet for Patients: EntrepreneurPulse.com.au  Fact Sheet for Healthcare Providers: IncredibleEmployment.be  This test is not yet approved or cleared by the [place] FDA and has been authorized for detection and/or diagnosis of SARS-CoV-2 by FDA under an Emergency Use Authorization (  EUA). This EUA will remain in effect (meaning this test can be used) for the duration of the COVID-19 declaration under Section 564(b)(1) of the Act, 21 U.S.C. section 360bbb-3(b)(1), unless the authorization is terminated or revoked.  Performed at [facility], [place] [address], [place], [place] [address]   MRSA PCR Screening     Status: Abnormal   Collection Time: 09/26/20 10:14 AM   Specimen: Nasopharyngeal  Result Value Ref Range Status   MRSA by PCR POSITIVE (A) NEGATIVE Final    Comment:        The GeneXpert MRSA Assay (FDA approved for NASAL specimens only), is one component of a comprehensive MRSA colonization surveillance program. It is not intended to diagnose MRSA infection nor to guide or monitor treatment for MRSA infections. RESULT CALLED TO, READ BACK BY AND VERIFIED WITH: F,VEATTH @1317  09/26/20 EB Performed at [facility], [place] [address], [place], [place] [address]   Resp Panel by RT-PCR (Flu A&B, Covid) Nasopharyngeal Swab     Status: None   Collection Time: 10/01/20 11:03 AM   Specimen: Nasopharyngeal Swab; Nasopharyngeal(NP) swabs in vial transport medium  Result Value Ref Range  Status   SARS Coronavirus 2 by RT PCR NEGATIVE NEGATIVE Final    Comment: (NOTE) SARS-CoV-2 target nucleic acids are NOT DETECTED.  The SARS-CoV-2 RNA is generally detectable in upper respiratory specimens during the acute phase of infection. The lowest concentration of SARS-CoV-2 viral copies this assay can detect is 138 copies/mL. A negative result does not preclude SARS-Cov-2 infection and should not be used as the sole basis for treatment or other patient management decisions. A negative result may occur with  improper specimen collection/handling, submission of specimen other than nasopharyngeal swab, presence of viral mutation(s) within the areas targeted by this assay, and inadequate number of viral copies(<138 copies/mL). A negative result must be combined with clinical observations, patient history, and epidemiological information. The expected result is Negative.  Fact Sheet for Patients:  EntrepreneurPulse.com.au  Fact Sheet for Healthcare Providers:  IncredibleEmployment.be  This test is no t yet approved or cleared by the [place] FDA and  has been authorized for detection and/or diagnosis of SARS-CoV-2 by FDA under an Emergency Use Authorization (EUA). This EUA will remain  in effect (meaning this test can be used) for the duration of the COVID-19 declaration under Section 564(b)(1) of the Act, 21 U.S.C.section 360bbb-3(b)(1), unless the authorization is terminated  or revoked sooner.       Influenza A by PCR NEGATIVE NEGATIVE Final   Influenza B by PCR NEGATIVE NEGATIVE Final    Comment: (NOTE) The Xpert Xpress SARS-CoV-2/FLU/RSV plus assay is intended as an aid in the diagnosis of influenza from Nasopharyngeal swab specimens and should not be used as a sole basis for treatment. Nasal washings and aspirates are unacceptable for Xpert Xpress SARS-CoV-2/FLU/RSV testing.  Fact Sheet for  Patients: EntrepreneurPulse.com.au  Fact Sheet for Healthcare Providers: IncredibleEmployment.be  This test is not yet approved or cleared by the [place] FDA and has been authorized for detection and/or diagnosis of SARS-CoV-2 by FDA under an Emergency Use Authorization (EUA). This EUA will remain in effect (meaning this test can be used) for the duration of the COVID-19 declaration under Section 564(b)(1) of the Act, 21 U.S.C. section 360bbb-3(b)(1), unless the authorization is terminated or revoked.  Performed at [facility], [place] [address], [place], [place] [address]      Radiology Studies: US RENAL  Result Date:  10/03/2020 CLINICAL DATA:  Acute kidney injury. EXAM: RENAL / URINARY TRACT ULTRASOUND COMPLETE COMPARISON:  Renal ultrasound 08/09/2020.  CT 11/07/2019 FINDINGS: Right Kidney: Renal measurements: 8.8 x 4.1 x 4.4 cm = volume: 84 mL. No hydronephrosis. There is increased renal parenchymal echogenicity. No evidence of focal lesion or stone. Left Kidney: Renal measurements: 6.7 x 4.1 x 3.3 cm = volume: 47 mL. Kidney is chronically atrophic, difficult to visualize with ultrasound. No evidence of hydronephrosis or focal renal abnormality. Bladder: Partially distended. Appears normal for degree of bladder distention. Other: Moderate volume abdominopelvic ascites. Technically limited exam due to habitus. IMPRESSION: 1. Chronic left renal atrophy. 2. No obstructive uropathy. Mild increased right renal parenchymal echogenicity most consistent with chronic medical renal disease. 3. Moderate abdominopelvic ascites. Electronically Signed   By: Keith Rake M.D.   On: 10/03/2020 16:26   IR Fluoro Guide CV Line Left  Result Date: 10/04/2020 INDICATION: 65 year old with acute on chronic kidney disease. EXAM: FLUOROSCOPIC AND ULTRASOUND GUIDED PLACEMENT OF A NON-TUNNELED DIALYSIS CATHETER Physician: Stephan Minister. Henn, MD MEDICATIONS: None  ANESTHESIA/SEDATION: None FLUOROSCOPY TIME:  Fluoroscopy Time: 1 minutes, 42 seconds, 13 mGy COMPLICATIONS: None immediate. PROCEDURE: The procedure was explained to the patient. The risks and benefits of the procedure were discussed and the patient's questions were addressed. Informed consent was obtained from the patient. The patient was placed supine on the interventional table. Ultrasound confirmed a patent right internal jugular vein. Ultrasound images were obtained for documentation. The right neck was prepped and draped in a sterile fashion. The right neck was anesthetized with 1% lidocaine. Maximal barrier sterile technique was utilized including caps, mask, sterile gowns, sterile gloves, sterile drape, hand hygiene and skin antiseptic. A small incision was made with #11 blade scalpel. A 21 gauge needle directed into the right internal jugular vein with ultrasound guidance. A micropuncture dilator set was placed. Wire would not advance centrally. Micropuncture dilator set was removed and a bandage was placed at the puncture site. Ultrasound confirmed a patent left internal jugular vein. Ultrasound image was saved for documentation. Left side of the neck was prepped and draped in sterile fashion. Maximal barrier sterile technique was utilized including caps, mask, sterile gowns, sterile gloves, sterile drape, hand hygiene and skin antiseptic. Skin was anesthetized using 1% lidocaine. Small incision was made. Using ultrasound guidance, 21 gauge needle was directed into the left internal jugular vein and wire easily advanced centrally. Micropuncture dilator set was placed. J wire was placed. Tract was dilated. A 20 cm Mahurkar catheter was selected. The catheter was advanced over a wire and positioned at the superior cavoatrial junction. Fluoroscopic images were obtained for documentation. Both dialysis lumens were found to aspirate and flush well. The proper amount of heparin was flushed in both lumens. The  central venous lumen was flushed with normal saline. Catheter was sutured to skin. FINDINGS: 1. Right internal jugular vein is patent but there is peripheral wall thickening suggesting old thrombus in this area. Wire would not advance centrally from the right internal jugular vein and not clear if there is a central occlusion or possibly septations from old thrombus. 2. Left internal jugular vein is widely patent. 3. Catheter tip is near the superior cavoatrial junction. IMPRESSION: Successful placement of a left jugular non-tunneled dialysis catheter using ultrasound and fluoroscopic guidance. Electronically Signed   By: Markus Daft M.D.   On: 10/04/2020 13:44   IR US Guide Vasc Access Left  Result Date: 10/04/2020 INDICATION: 65 year old with acute on chronic kidney  disease. EXAM: FLUOROSCOPIC AND ULTRASOUND GUIDED PLACEMENT OF A NON-TUNNELED DIALYSIS CATHETER Physician: Stephan Minister. Henn, MD MEDICATIONS: None ANESTHESIA/SEDATION: None FLUOROSCOPY TIME:  Fluoroscopy Time: 1 minutes, 42 seconds, 13 mGy COMPLICATIONS: None immediate. PROCEDURE: The procedure was explained to the patient. The risks and benefits of the procedure were discussed and the patient's questions were addressed. Informed consent was obtained from the patient. The patient was placed supine on the interventional table. Ultrasound confirmed a patent right internal jugular vein. Ultrasound images were obtained for documentation. The right neck was prepped and draped in a sterile fashion. The right neck was anesthetized with 1% lidocaine. Maximal barrier sterile technique was utilized including caps, mask, sterile gowns, sterile gloves, sterile drape, hand hygiene and skin antiseptic. A small incision was made with #11 blade scalpel. A 21 gauge needle directed into the right internal jugular vein with ultrasound guidance. A micropuncture dilator set was placed. Wire would not advance centrally. Micropuncture dilator set was removed and a bandage was  placed at the puncture site. Ultrasound confirmed a patent left internal jugular vein. Ultrasound image was saved for documentation. Left side of the neck was prepped and draped in sterile fashion. Maximal barrier sterile technique was utilized including caps, mask, sterile gowns, sterile gloves, sterile drape, hand hygiene and skin antiseptic. Skin was anesthetized using 1% lidocaine. Small incision was made. Using ultrasound guidance, 21 gauge needle was directed into the left internal jugular vein and wire easily advanced centrally. Micropuncture dilator set was placed. J wire was placed. Tract was dilated. A 20 cm Mahurkar catheter was selected. The catheter was advanced over a wire and positioned at the superior cavoatrial junction. Fluoroscopic images were obtained for documentation. Both dialysis lumens were found to aspirate and flush well. The proper amount of heparin was flushed in both lumens. The central venous lumen was flushed with normal saline. Catheter was sutured to skin. FINDINGS: 1. Right internal jugular vein is patent but there is peripheral wall thickening suggesting old thrombus in this area. Wire would not advance centrally from the right internal jugular vein and not clear if there is a central occlusion or possibly septations from old thrombus. 2. Left internal jugular vein is widely patent. 3. Catheter tip is near the superior cavoatrial junction. IMPRESSION: Successful placement of a left jugular non-tunneled dialysis catheter using ultrasound and fluoroscopic guidance. Electronically Signed   By: Markus Daft M.D.   On: 10/04/2020 13:44    Scheduled Meds: . carvedilol  25 mg Oral BID WC  . Chlorhexidine Gluconate Cloth  6 each Topical Q0600  . ferrous sulfate  325 mg Oral BID WC  . heparin sodium (porcine)      . hydrALAZINE  50 mg Oral Q8H  . hydrocortisone cream   Topical BID  . isosorbide mononitrate  30 mg Oral Daily  . lidocaine      . nutrition supplement (JUVEN)  1  packet Oral BID BM  . potassium chloride SA  10 mEq Oral BID  . sodium chloride flush  3 mL Intravenous Q12H   Continuous Infusions: . cefTRIAXone (ROCEPHIN)  IV 1 g (10/04/20 1159)  . furosemide 120 mg (10/04/20 0910)     LOS: 9 days    Time spent: 25 mins    PARDEEP KUMAR, MD [facility]   If 7PM-7AM, please contact night-coverage

## 2020-10-04 NOTE — H&P (Signed)
Chief Complaint: Acute on chronic renal failure  Referring Physician(s): Gean Quint  Supervising Physician: Markus Daft  Patient Status: [facility] - In-pt  History of Present Illness: Vanessa [name] is a 65 y.o. female with medical issues including systolic and diastolic heart failure, A-fib (not on anticoagulation), endometrial cancer, CKD stage IV, diabetes, and hypertension.  She presented to the ED on 09/24/20 with worsening shortness of breath.  Creatinine was elevated at 4.31 which is worse than baseline.  She is no oliguric and we are asked to place a temporary hemodialysis catheter.  Per chart, Vanessa [name] is leaning toward palliative care due to her ongoing medical issues and she is not certain she would pursue long term hemodialysis.  Past Medical History:  Diagnosis Date  . Anemia 10/09/2019  . Asthma   . Cellulitis and abscess of lower extremity 04/22/2020  . CKD (chronic kidney disease) stage 3, GFR 30-59 ml/min (HCC)   . Dental disease   . Diabetes mellitus without complication ([place])   . Endometrial cancer ([place])   . Esophageal dysmotility   . Heart murmur   . HLD (hyperlipidemia)   . Hypertension   . MSSA bacteremia 09/2019  . Obesity, Class III, BMI 40-49.9 (morbid obesity) ([place])   . Pressure ulcer   . Vaginal bleeding 05/2020    Past Surgical History:  Procedure Laterality Date  . BIOPSY  10/08/2019   Procedure: BIOPSY;  Surgeon: Clarene Essex, MD;  Location: [facility] ENDOSCOPY;  Service: Endoscopy;;  . ESOPHAGOGASTRODUODENOSCOPY N/A 10/08/2019   Procedure: ESOPHAGOGASTRODUODENOSCOPY (EGD);  Surgeon: Clarene Essex, MD;  Location: [place];  Service: Endoscopy;  Laterality: N/A;  . TYMPANOSTOMY TUBE PLACEMENT Bilateral     Allergies: Ace inhibitors and Angiotensin receptor blockers  Medications: Prior to Admission medications   Medication Sig Start Date End Date Taking? Authorizing Provider  acetaminophen (TYLENOL) 500 MG tablet Take 500 mg by mouth  every 6 (six) hours as needed (pain).    Yes [provider]  aspirin 81 MG chewable tablet Chew 1 tablet (81 mg total) by mouth daily. 08/18/20  Yes Mikhail, Velta Addison, DO  carvedilol (COREG) 25 MG tablet Take 1 tablet (25 mg total) by mouth 2 (two) times daily with a meal. 08/18/20  Yes Mikhail, [place], DO  diphenhydrAMINE (BENADRYL) 25 MG tablet Take 25 mg by mouth daily as needed for itching or allergies.   Yes [provider]  ferrous sulfate 325 (65 FE) MG tablet Take 1 tablet (325 mg total) by mouth 2 (two) times daily with a meal. 12/13/19  Yes Medina-Vargas, Monina C, NP  fluticasone (FLONASE) 50 MCG/ACT nasal spray Place 1 spray into both nostrils daily as needed for allergies or rhinitis.   Yes [provider]  hydrALAZINE (APRESOLINE) 50 MG tablet Take 1 tablet (50 mg total) by mouth every 8 (eight) hours. 08/18/20  Yes Mikhail, [place], DO  hydrocerin (EUCERIN) CREA Apply 1 application topically daily. 04/29/20  Yes Swayze, Ava, DO  hydrocortisone (ANUSOL-HC) 2.5 % rectal cream Place 1 application rectally every 12 (twelve) hours as needed for hemorrhoids. 08/18/20  Yes Mikhail, Velta Addison, DO  hydrOXYzine (ATARAX/VISTARIL) 25 MG tablet Take 1 tablet (25 mg total) by mouth 3 (three) times daily as needed for itching. 08/18/20  Yes Cristal Ford, DO  [facility]) OINT Apply 1 application topically See admin instructions. To buttocks bid and after incontinent episodes.   Yes [provider]  isosorbide mononitrate (IMDUR) 30 MG 24 hr tablet Take 1  tablet (30 mg total) by mouth daily. 08/19/20  Yes Mikhail, Velta Addison, DO  loratadine (CLARITIN) 10 MG tablet Take 1 tablet (10 mg total) by mouth daily as needed for allergies. 12/13/19  Yes Medina-Vargas, Monina C, NP  nystatin (MYCOSTATIN/NYSTOP) powder Apply 1 application topically See admin instructions. Under both breasts daily after bath until resolved   Yes [provider]  polyethylene glycol  (MIRALAX / GLYCOLAX) 17 g packet Take 17 g by mouth daily. 09/25/19  Yes Arrien, Jimmy Picket, MD  potassium chloride (KLOR-CON) 10 MEQ tablet Take 10 mEq by mouth 2 (two) times daily.   Yes [provider]  torsemide (DEMADEX) 100 MG tablet Take 50 mg by mouth 2 (two) times daily.   Yes [provider]  witch hazel-glycerin (TUCKS) pad Apply topically as needed for itching, hemorrhoids or irritation. 08/18/20  Yes Mikhail, [place], DO  nutrition supplement, JUVEN, (JUVEN) PACK Take 1 packet by mouth 2 (two) times daily between meals. Patient not taking: No sig reported 04/29/20   Swayze, Ava, DO  potassium chloride 20 MEQ TBCR Take 10 mEq by mouth 2 (two) times daily. Patient not taking: No sig reported 08/18/20   Cristal Ford, DO  torsemide (DEMADEX) 100 MG tablet Take 100 mg by mouth 2 (two) times daily. Patient not taking: No sig reported    [provider]     Family History  Problem Relation Age of Onset  . Stroke Mother   . Diabetes Mother   . Hypertension Mother   . Cancer Mother        possible ovarian  . Diabetes Brother   . Hypertension Brother   . Cancer Maternal Grandmother        possible ovarian    Social History   Socioeconomic History  . Marital status: Married    Spouse name: Not on file  . Number of children: Not on file  . Years of education: Not on file  . Highest education level: Not on file  Occupational History  . Not on file  Tobacco Use  . Smoking status: Former Research scientist (life sciences)  . Smokeless tobacco: Never Used  . Tobacco comment: " years ago ", >36yrs  Vaping Use  . Vaping Use: Never used  Substance and Sexual Activity  . Alcohol use: Yes    Comment: occasional  . Drug use: No  . Sexual activity: Not Currently  Other Topics Concern  . Not on file  Social History Narrative   Lives with her daughter and her 4 children.    Social Determinants of Health   Financial Resource Strain: Not on file  Food Insecurity: Not on  file  Transportation Needs: Not on file  Physical Activity: Not on file  Stress: Not on file  Social Connections: Not on file     Review of Systems: A 12 point ROS discussed and pertinent positives are indicated in the HPI above.  All other systems are negative.  Review of Systems  Vital Signs: BP (!) 130/97 (BP Location: Right Arm)   Pulse (!) 108   Temp (!) 97.5 F (36.4 C) (Axillary)   Resp 13   Ht 4\' 11"  (1.499 m)   Wt 101.4 kg   LMP 03/17/2015   SpO2 100%   BMI 45.16 kg/m   Physical Exam Vitals reviewed.  Constitutional:      Appearance: Normal appearance.  Cardiovascular:     Rate and Rhythm: Tachycardia present.  Pulmonary:     Effort: Pulmonary effort is normal.  No respiratory distress.  Abdominal:     Palpations: Abdomen is soft.  Neurological:     General: No focal deficit present.     Mental Status: She is alert and oriented to person, place, and time.  Psychiatric:        Mood and Affect: Mood normal.        Behavior: Behavior normal.        Thought Content: Thought content normal.        Judgment: Judgment normal.     Imaging: DG Chest 2 View  Result Date: 09/24/2020 CLINICAL DATA:  Chest pain, shortness of breath and weakness. EXAM: CHEST - 2 VIEW COMPARISON:  August 07, 2020 FINDINGS: Decreased lung volumes are noted. Mild atelectasis and/or infiltrate is seen within the right lung base. There is a small right pleural effusion. The cardiac silhouette is markedly enlarged and unchanged in size. Degenerative changes seen throughout the thoracic spine and bilateral shoulders. IMPRESSION: 1. Stable cardiomegaly with mild right basilar atelectasis and/or infiltrate. 2. Small right pleural effusion. Electronically Signed   By: Virgina Norfolk M.D.   On: 09/24/2020 15:23   US RENAL  Result Date: 10/03/2020 CLINICAL DATA:  Acute kidney injury. EXAM: RENAL / URINARY TRACT ULTRASOUND COMPLETE COMPARISON:  Renal ultrasound 08/09/2020.  CT 11/07/2019  FINDINGS: Right Kidney: Renal measurements: 8.8 x 4.1 x 4.4 cm = volume: 84 mL. No hydronephrosis. There is increased renal parenchymal echogenicity. No evidence of focal lesion or stone. Left Kidney: Renal measurements: 6.7 x 4.1 x 3.3 cm = volume: 47 mL. Kidney is chronically atrophic, difficult to visualize with ultrasound. No evidence of hydronephrosis or focal renal abnormality. Bladder: Partially distended. Appears normal for degree of bladder distention. Other: Moderate volume abdominopelvic ascites. Technically limited exam due to habitus. IMPRESSION: 1. Chronic left renal atrophy. 2. No obstructive uropathy. Mild increased right renal parenchymal echogenicity most consistent with chronic medical renal disease. 3. Moderate abdominopelvic ascites. Electronically Signed   By: Keith Rake M.D.   On: 10/03/2020 16:26    Labs:  CBC: Recent Labs    09/30/20 0300 10/01/20 0232 10/02/20 0305 10/03/20 0111  WBC 4.0 3.4* 2.5* 2.9*  HGB 10.8* 10.4* 10.4* 10.4*  HCT 33.3* 31.8* 31.8* 33.1*  PLT 103* 97* 103* 109*    COAGS: Recent Labs    11/08/19 0437 08/07/20 1736  INR 1.2 1.3*    BMP: Recent Labs    12/05/19 0000 12/10/19 0000 02/14/20 2012 03/18/20 1254 04/22/20 0056 10/01/20 0232 10/02/20 0305 10/03/20 0111 10/04/20 0335  NA 148* 146 139 142   < > 142 143 143 142  K 4.5 4.1 2.9* 3.8   < > 3.9 3.9 4.2 3.6  CL 109* 110* 108 112*   < > 107 106 109 105  CO2 21 23* 24 23   < > 25 24 23 25   GLUCOSE  --   --  111* 132*   < > 83 80 72 94  BUN 53* 32* 28* 24*   < > 100* 105* 109* 115*  CALCIUM 8.8 9.0 8.5* 8.5*   < > 8.2* 8.4* 8.4* 8.3*  CREATININE 2.9* 2.0* 2.14* 2.00*   < > 3.81* 3.98* 3.98* 4.11*  GFRNONAA 16.39 25.74 24* 26*   < > 12* 12* 12* 11*  GFRAA 19 29.83 27* 30*  --   --   --   --   --    < > = values in this interval not displayed.    LIVER FUNCTION  TESTS: Recent Labs    06/09/20 0737 06/10/20 1124 08/07/20 1736 08/12/20 0237 08/17/20 0133  08/18/20 0435 09/27/20 0216 09/28/20 0505  BILITOT 0.7 0.6 0.9  --   --   --   --  0.7  AST 17 18 48*  --   --   --   --  14*  ALT 12 14 60*  --   --   --   --  8  ALKPHOS 82 87 90  --   --   --   --  56  PROT 7.2 7.5 6.7  --   --   --   --  6.9  ALBUMIN 3.1* 3.1* 2.5*   < > 2.7* 2.5* 2.8* 2.8*   < > = values in this interval not displayed.    TUMOR MARKERS: No results for input(s): AFPTM, CEA, CA199, CHROMGRNA in the last 8760 hours.  Assessment and Plan:  Acute on chronic renal failure.  Will proceed with image guided placement of a temporary hemodialysis catheter today by Dr. Anselm Pancoast.  Risks and benefits discussed with the patient including, but not limited to bleeding, infection, vascular injury, pneumothorax which may require chest tube placement, air embolism or even death  All of the patient's questions were answered, patient is agreeable to proceed. Consent signed and in chart.  Thank you for this interesting consult.  I greatly enjoyed meeting Mavi Un and look forward to participating in their care.  A copy of this report was sent to the requesting provider on this date.  Electronically Signed: Murrell Redden, PA-C   10/04/2020, 9:20 AM      I spent a total of 20 Minutes in face to face in clinical consultation, greater than 50% of which was counseling/coordinating care for temp cath placement.

## 2020-10-05 ENCOUNTER — Inpatient Hospital Stay (HOSPITAL_COMMUNITY): Payer: Medicare Other

## 2020-10-05 DIAGNOSIS — N179 Acute kidney failure, unspecified: Secondary | ICD-10-CM | POA: Diagnosis not present

## 2020-10-05 DIAGNOSIS — N189 Chronic kidney disease, unspecified: Secondary | ICD-10-CM | POA: Diagnosis not present

## 2020-10-05 DIAGNOSIS — I5043 Acute on chronic combined systolic (congestive) and diastolic (congestive) heart failure: Secondary | ICD-10-CM | POA: Diagnosis not present

## 2020-10-05 DIAGNOSIS — I10 Essential (primary) hypertension: Secondary | ICD-10-CM | POA: Diagnosis not present

## 2020-10-05 LAB — RENAL FUNCTION PANEL
Albumin: 2.7 g/dL — ABNORMAL LOW (ref 3.5–5.0)
Anion gap: 12 (ref 5–15)
BUN: 85 mg/dL — ABNORMAL HIGH (ref 8–23)
CO2: 25 mmol/L (ref 22–32)
Calcium: 8.6 mg/dL — ABNORMAL LOW (ref 8.9–10.3)
Chloride: 104 mmol/L (ref 98–111)
Creatinine, Ser: 3.26 mg/dL — ABNORMAL HIGH (ref 0.44–1.00)
GFR, Estimated: 15 mL/min — ABNORMAL LOW (ref >60)
Glucose, Bld: 103 mg/dL — ABNORMAL HIGH (ref 70–99)
Phosphorus: 4.5 mg/dL (ref 2.5–4.6)
Potassium: 3.7 mmol/L (ref 3.5–5.1)
Sodium: 141 mmol/L (ref 135–145)

## 2020-10-05 LAB — BASIC METABOLIC PANEL
Anion gap: 11 (ref 5–15)
BUN: 114 mg/dL — ABNORMAL HIGH (ref 8–23)
CO2: 26 mmol/L (ref 22–32)
Calcium: 8.3 mg/dL — ABNORMAL LOW (ref 8.9–10.3)
Chloride: 106 mmol/L (ref 98–111)
Creatinine, Ser: 4.03 mg/dL — ABNORMAL HIGH (ref 0.44–1.00)
GFR, Estimated: 12 mL/min — ABNORMAL LOW (ref >60)
Glucose, Bld: 135 mg/dL — ABNORMAL HIGH (ref 70–99)
Potassium: 3.4 mmol/L — ABNORMAL LOW (ref 3.5–5.1)
Sodium: 143 mmol/L (ref 135–145)

## 2020-10-05 LAB — CBC
HCT: 32.6 % — ABNORMAL LOW (ref 36.0–46.0)
Hemoglobin: 10.4 g/dL — ABNORMAL LOW (ref 12.0–15.0)
MCH: 28.7 pg (ref 26.0–34.0)
MCHC: 31.9 g/dL (ref 30.0–36.0)
MCV: 90.1 fL (ref 80.0–100.0)
Platelets: 78 10*3/uL — ABNORMAL LOW (ref 150–400)
RBC: 3.62 MIL/uL — ABNORMAL LOW (ref 3.87–5.11)
RDW: 17.8 % — ABNORMAL HIGH (ref 11.5–15.5)
WBC: 2.4 10*3/uL — ABNORMAL LOW (ref 4.0–10.5)
nRBC: 1.2 % — ABNORMAL HIGH (ref 0.0–0.2)

## 2020-10-05 LAB — HEMOGLOBIN AND HEMATOCRIT, BLOOD
HCT: 35.6 % — ABNORMAL LOW (ref 36.0–46.0)
Hemoglobin: 11 g/dL — ABNORMAL LOW (ref 12.0–15.0)

## 2020-10-05 LAB — HEPATITIS B CORE ANTIBODY, TOTAL: Hep B Core Total Ab: NONREACTIVE

## 2020-10-05 LAB — HEPATITIS B SURFACE ANTIBODY,QUALITATIVE: Hep B S Ab: NONREACTIVE

## 2020-10-05 LAB — HEPATITIS B SURFACE ANTIGEN: Hepatitis B Surface Ag: NONREACTIVE

## 2020-10-05 MED ORDER — POTASSIUM CHLORIDE 20 MEQ PO PACK
40.0000 meq | PACK | Freq: Once | ORAL | Status: AC
  Administered 2020-10-05: 40 meq via ORAL
  Filled 2020-10-05 (×2): qty 2

## 2020-10-05 MED ORDER — HEPARIN SODIUM (PORCINE) 1000 UNIT/ML IJ SOLN
INTRAMUSCULAR | Status: AC
  Filled 2020-10-05: qty 3

## 2020-10-05 MED ORDER — SODIUM CHLORIDE 0.9 % IV SOLN: 100.0000 mL | INTRAVENOUS | Status: DC | PRN

## 2020-10-05 MED ORDER — ALTEPLASE 2 MG IJ SOLR: 2.0000 mg | Freq: Once | INTRAMUSCULAR | Status: DC | PRN

## 2020-10-05 MED ORDER — LIDOCAINE-PRILOCAINE 2.5-2.5 % EX CREA: 1.0000 "application " | TOPICAL_CREAM | CUTANEOUS | Status: DC | PRN

## 2020-10-05 MED ORDER — PENTAFLUOROPROP-TETRAFLUOROETH EX AERO: 1.0000 "application " | INHALATION_SPRAY | CUTANEOUS | Status: DC | PRN

## 2020-10-05 MED ORDER — HEPARIN SODIUM (PORCINE) 1000 UNIT/ML DIALYSIS: 1000.0000 [IU] | INTRAMUSCULAR | Status: DC | PRN

## 2020-10-05 MED ORDER — LIDOCAINE HCL (PF) 1 % IJ SOLN: 5.0000 mL | INTRAMUSCULAR | Status: DC | PRN

## 2020-10-05 NOTE — Progress Notes (Signed)
Physical Therapy Treatment Patient Details Name: Vanessa [name] MRN: [id number] DOB: 30-Oct-1955 Today's Date: 10/05/2020    History of Present Illness Patient is a 65 y.o. female admitted from SNF on 09/24/20 with SOB, weakness, edema and dizziness. Workup for volume overload secondary to heart failure and CKD. S/p L IJ non-tunneled HD cath placement 3/27. iHD initiated 3/28. Of note, recent admission 07/30/20-08/18/20 for CHF exacerbation, AKI and new A-fib with RVR with d/c to SNF. PMH includes endometrial carcinoma (2021), COVID-19 PNA (2021), MSSA baceteremia, essential HTN, DM2, CKD, chronic diastolic HF.   PT Comments    Pt slowly progressing with mobility; endorses fatigue post-HD this AM. Today's session focused on transfer training and standing therex with RW and minA. Pt remains limited by generalized weakness, decreased activity tolerance and impaired balance; pt quick to fatigue requiring seated rest after short bouts of activity. Continue to recommend SNF-level therapies to maximize functional mobility and independence prior to return home.   Follow Up Recommendations  SNF;Supervision for mobility/OOB     Equipment Recommendations  3in1 (PT)    Recommendations for Other Services       Precautions / Restrictions Precautions Precautions: Fall;Other (comment) Precaution Comments: Bladder/bowel incontinence with mobility at times; sore bottom due to hemorrhoids Restrictions Weight Bearing Restrictions: No    Mobility  Bed Mobility               General bed mobility comments: received in chair    Transfers Overall transfer level: Needs assistance Equipment used: Rolling walker (2 wheeled) Transfers: Sit to/from Stand Sit to Stand: Min assist         General transfer comment: Pt unable to achieve upright with initial standing attempts, requiring minA for trunk elevation standing to RW on third trial  Ambulation/Gait Ambulation/Gait assistance: Min guard    Assistive device: Rolling walker (2 wheeled)       General Gait Details: Pt with multiple bouts of bowel incontinence upon standing; therefore performed pre-gait activity, marching in place in front of recliner with RW and min guard; pt requiring cues to take complete steps versus just weight shifting   Stairs             Wheelchair Mobility    Modified Rankin (Stroke Patients Only)       Balance Overall balance assessment: Needs assistance Sitting-balance support: No upper extremity supported;Feet supported Sitting balance-Leahy Scale: Fair     Standing balance support: Single extremity supported;Bilateral upper extremity supported Standing balance-Leahy Scale: Poor Standing balance comment: Reliant on BUE support; dependent for pericare                            Cognition Arousal/Alertness: Awake/alert Behavior During Therapy: WFL for tasks assessed/performed Overall Cognitive Status: No family/caregiver present to determine baseline cognitive functioning Area of Impairment: Attention;Awareness;Problem solving                   Current Attention Level: Selective       Awareness: Emergent Problem Solving: Requires verbal cues        Exercises General Exercises - Lower Extremity Ankle Circles/Pumps: AROM;Both;Seated Long Arc Quad: AROM;Both;Seated Hip Flexion/Marching: AROM;Both;Standing    General Comments General comments (skin integrity, edema, etc.): Pt quick to fatigue; SpO2 100% on RA      Pertinent Vitals/Pain Pain Assessment: Faces Faces Pain Scale: Hurts a little bit Pain Location: Buttocks with pericare Pain Descriptors / Indicators: Discomfort Pain Intervention(s): Monitored during  session;Limited activity within patient's tolerance    Home Living                      Prior Function            PT Goals (current goals can now be found in the care plan section) Progress towards PT goals: Progressing toward  goals (slowly)    Frequency    Min 2X/week      PT Plan Current plan remains appropriate    Co-evaluation              AM-PAC PT "6 Clicks" Mobility   Outcome Measure  Help needed turning from your back to your side while in a flat bed without using bedrails?: A Little Help needed moving from lying on your back to sitting on the side of a flat bed without using bedrails?: A Little Help needed moving to and from a bed to a chair (including a wheelchair)?: A Little Help needed standing up from a chair using your arms (e.g., wheelchair or bedside chair)?: A Little Help needed to walk in hospital room?: A Little Help needed climbing 3-5 steps with a railing? : A Lot 6 Click Score: 17    End of Session Equipment Utilized During Treatment: Gait belt Activity Tolerance: Patient limited by fatigue Patient left: in chair;with call bell/phone within reach;with chair alarm set Nurse Communication: Mobility status PT Visit Diagnosis: Unsteadiness on feet (R26.81);Muscle weakness (generalized) (M62.81);Difficulty in walking, not elsewhere classified (R26.2);Pain     Time: 7116-5790 PT Time Calculation (min) (ACUTE ONLY): 21 min  Charges:  $Therapeutic Activity: 8-22 mins                    Mabeline Caras, PT, DPT Acute Rehabilitation Services  Pager [phone] Office [place] 10/05/2020, 5:42 PM

## 2020-10-05 NOTE — Plan of Care (Signed)
  Problem: Education: Goal: Ability to demonstrate management of disease process will improve Outcome: Progressing Goal: Ability to verbalize understanding of medication therapies will improve Outcome: Progressing Goal: Individualized Educational Video(s) Outcome: Progressing   Problem: Activity: Goal: Capacity to carry out activities will improve Outcome: Progressing   Problem: Activity: Goal: Capacity to carry out activities will improve Outcome: Progressing   Problem: Cardiac: Goal: Ability to achieve and maintain adequate cardiopulmonary perfusion will improve Outcome: Progressing   Problem: Education: Goal: Knowledge of General Education information will improve Description: Including pain rating scale, medication(s)/side effects and non-pharmacologic comfort measures Outcome: Progressing

## 2020-10-05 NOTE — Progress Notes (Signed)
Patient on consult list requesting prayer. Chaplain visited with patient before she went into surgery. Chaplain offered prayer and emotional support and is available when needed.    10/05/20 1100  Clinical Encounter Type  Visited With Patient  Visit Type Initial  Referral From Nurse  Consult/Referral To Chaplain  Spiritual Encounters  Spiritual Needs Prayer  Stress Factors  Patient Stress Factors Health changes

## 2020-10-05 NOTE — Progress Notes (Addendum)
Progress Note  Patient Name: Vanessa [name] Date of Encounter: 10/05/2020  Primary Cardiologist: Dr. Meda Coffee last admission - will need reassignment upon MD relocation  Subjective   She just got bad news this morning that her son was found deceased. She states "he had his own problems." She is grateful for the support her family has shown. She denies any CP or SOB. Today is first day of HD.  Inpatient Medications    Scheduled Meds: . carvedilol  25 mg Oral BID WC  . Chlorhexidine Gluconate Cloth  6 each Topical Q0600  . ferrous sulfate  325 mg Oral BID WC  . hydrALAZINE  50 mg Oral Q8H  . hydrocortisone cream   Topical BID  . isosorbide mononitrate  30 mg Oral Daily  . nutrition supplement (JUVEN)  1 packet Oral BID BM  . potassium chloride SA  10 mEq Oral BID  . sodium chloride flush  3 mL Intravenous Q12H   Continuous Infusions: . cefTRIAXone (ROCEPHIN)  IV 1 g (10/04/20 1159)  . furosemide 120 mg (10/04/20 1757)   PRN Meds: acetaminophen **OR** acetaminophen, camphor-menthol, hydrOXYzine, ondansetron **OR** ondansetron (ZOFRAN) IV, phenylephrine, polyethylene glycol   Vital Signs    Vitals:   10/05/20 0652 10/05/20 0657 10/05/20 0730 10/05/20 0800  BP: (!) 157/99 (!) 143/98 (!) 121/98 122/81  Pulse:      Resp: 19 12 13 14   Temp: 97.6 F (36.4 C)     TempSrc: Oral     SpO2: 100%     Weight: 109.8 kg     Height:        Intake/Output Summary (Last 24 hours) at 10/05/2020 0826 Last data filed at 10/04/2020 1900 Gross per 24 hour  Intake 3 ml  Output 550 ml  Net -547 ml   Last 3 Weights 10/05/2020 10/05/2020 10/04/2020  Weight (lbs) 242 lb 2.7 oz 221 lb 3.2 oz 223 lb 9.6 oz  Weight (kg) 109.848 kg 100.336 kg 101.424 kg    Telemetry    ? Atrial flutter - will review with MD, also interspersed NSR - Personally Reviewed  Physical Exam   GEN: No acute distress.  HEENT: Normocephalic, atraumatic, sclera non-icteric. Neck: No JVD or bruits. Cardiac: Reg rhythm  borderline tachycardic no murmurs, rubs, or gallops.  Respiratory: Clear to auscultation bilaterally. Breathing is unlabored. GI: Soft, nontender, non-distended, BS +x 4. MS: no deformity. Extremities: No clubbing or cyanosis. Marked BLE edema.  Neuro:  AAOx3. Follows commands. Psych:  Responds to questions appropriately with a normal affect.  Labs    High Sensitivity Troponin:   Recent Labs  Lab 09/24/20 1447 09/24/20 1900  TROPONINIHS 36* 37*      Cardiac EnzymesNo results for input(s): TROPONINI in the last 168 hours. No results for input(s): TROPIPOC in the last 168 hours.   Chemistry Recent Labs  Lab 10/03/20 0111 10/04/20 0335 10/05/20 0157  NA 143 142 143  K 4.2 3.6 3.4*  CL 109 105 106  CO2 23 25 26   GLUCOSE 72 94 135*  BUN 109* 115* 114*  CREATININE 3.98* 4.11* 4.03*  CALCIUM 8.4* 8.3* 8.3*  GFRNONAA 12* 11* 12*  ANIONGAP 11 12 11      Hematology Recent Labs  Lab 10/01/20 0232 10/02/20 0305 10/03/20 0111 10/05/20 0157  WBC 3.4* 2.5* 2.9*  --   RBC 3.58* 3.58* 3.65*  --   HGB 10.4* 10.4* 10.4* 11.0*  HCT 31.8* 31.8* 33.1* 35.6*  MCV 88.8 88.8 90.7  --  MCH 29.1 29.1 28.5  --   MCHC 32.7 32.7 31.4  --   RDW 17.7* 17.8* 17.9*  --   PLT 97* 103* 109*  --     BNP Recent Labs  Lab 10/02/20 0305  BNP 1,216.5*     DDimer No results for input(s): DDIMER in the last 168 hours.   Radiology    US RENAL  Result Date: 10/03/2020 CLINICAL DATA:  Acute kidney injury. EXAM: RENAL / URINARY TRACT ULTRASOUND COMPLETE COMPARISON:  Renal ultrasound 08/09/2020.  CT 11/07/2019 FINDINGS: Right Kidney: Renal measurements: 8.8 x 4.1 x 4.4 cm = volume: 84 mL. No hydronephrosis. There is increased renal parenchymal echogenicity. No evidence of focal lesion or stone. Left Kidney: Renal measurements: 6.7 x 4.1 x 3.3 cm = volume: 47 mL. Kidney is chronically atrophic, difficult to visualize with ultrasound. No evidence of hydronephrosis or focal renal abnormality.  Bladder: Partially distended. Appears normal for degree of bladder distention. Other: Moderate volume abdominopelvic ascites. Technically limited exam due to habitus. IMPRESSION: 1. Chronic left renal atrophy. 2. No obstructive uropathy. Mild increased right renal parenchymal echogenicity most consistent with chronic medical renal disease. 3. Moderate abdominopelvic ascites. Electronically Signed   By: Keith Rake M.D.   On: 10/03/2020 16:26   IR Fluoro Guide CV Line Left  Result Date: 10/04/2020 INDICATION: 65 year old with acute on chronic kidney disease. EXAM: FLUOROSCOPIC AND ULTRASOUND GUIDED PLACEMENT OF A NON-TUNNELED DIALYSIS CATHETER Physician: Stephan Minister. Henn, MD MEDICATIONS: None ANESTHESIA/SEDATION: None FLUOROSCOPY TIME:  Fluoroscopy Time: 1 minutes, 42 seconds, 13 mGy COMPLICATIONS: None immediate. PROCEDURE: The procedure was explained to the patient. The risks and benefits of the procedure were discussed and the patient's questions were addressed. Informed consent was obtained from the patient. The patient was placed supine on the interventional table. Ultrasound confirmed a patent right internal jugular vein. Ultrasound images were obtained for documentation. The right neck was prepped and draped in a sterile fashion. The right neck was anesthetized with 1% lidocaine. Maximal barrier sterile technique was utilized including caps, mask, sterile gowns, sterile gloves, sterile drape, hand hygiene and skin antiseptic. A small incision was made with #11 blade scalpel. A 21 gauge needle directed into the right internal jugular vein with ultrasound guidance. A micropuncture dilator set was placed. Wire would not advance centrally. Micropuncture dilator set was removed and a bandage was placed at the puncture site. Ultrasound confirmed a patent left internal jugular vein. Ultrasound image was saved for documentation. Left side of the neck was prepped and draped in sterile fashion. Maximal barrier  sterile technique was utilized including caps, mask, sterile gowns, sterile gloves, sterile drape, hand hygiene and skin antiseptic. Skin was anesthetized using 1% lidocaine. Small incision was made. Using ultrasound guidance, 21 gauge needle was directed into the left internal jugular vein and wire easily advanced centrally. Micropuncture dilator set was placed. J wire was placed. Tract was dilated. A 20 cm Mahurkar catheter was selected. The catheter was advanced over a wire and positioned at the superior cavoatrial junction. Fluoroscopic images were obtained for documentation. Both dialysis lumens were found to aspirate and flush well. The proper amount of heparin was flushed in both lumens. The central venous lumen was flushed with normal saline. Catheter was sutured to skin. FINDINGS: 1. Right internal jugular vein is patent but there is peripheral wall thickening suggesting old thrombus in this area. Wire would not advance centrally from the right internal jugular vein and not clear if there is a central occlusion or  possibly septations from old thrombus. 2. Left internal jugular vein is widely patent. 3. Catheter tip is near the superior cavoatrial junction. IMPRESSION: Successful placement of a left jugular non-tunneled dialysis catheter using ultrasound and fluoroscopic guidance. Electronically Signed   By: Markus Daft M.D.   On: 10/04/2020 13:44   IR US Guide Vasc Access Left  Result Date: 10/04/2020 INDICATION: 65 year old with acute on chronic kidney disease. EXAM: FLUOROSCOPIC AND ULTRASOUND GUIDED PLACEMENT OF A NON-TUNNELED DIALYSIS CATHETER Physician: Stephan Minister. Henn, MD MEDICATIONS: None ANESTHESIA/SEDATION: None FLUOROSCOPY TIME:  Fluoroscopy Time: 1 minutes, 42 seconds, 13 mGy COMPLICATIONS: None immediate. PROCEDURE: The procedure was explained to the patient. The risks and benefits of the procedure were discussed and the patient's questions were addressed. Informed consent was obtained from the  patient. The patient was placed supine on the interventional table. Ultrasound confirmed a patent right internal jugular vein. Ultrasound images were obtained for documentation. The right neck was prepped and draped in a sterile fashion. The right neck was anesthetized with 1% lidocaine. Maximal barrier sterile technique was utilized including caps, mask, sterile gowns, sterile gloves, sterile drape, hand hygiene and skin antiseptic. A small incision was made with #11 blade scalpel. A 21 gauge needle directed into the right internal jugular vein with ultrasound guidance. A micropuncture dilator set was placed. Wire would not advance centrally. Micropuncture dilator set was removed and a bandage was placed at the puncture site. Ultrasound confirmed a patent left internal jugular vein. Ultrasound image was saved for documentation. Left side of the neck was prepped and draped in sterile fashion. Maximal barrier sterile technique was utilized including caps, mask, sterile gowns, sterile gloves, sterile drape, hand hygiene and skin antiseptic. Skin was anesthetized using 1% lidocaine. Small incision was made. Using ultrasound guidance, 21 gauge needle was directed into the left internal jugular vein and wire easily advanced centrally. Micropuncture dilator set was placed. J wire was placed. Tract was dilated. A 20 cm Mahurkar catheter was selected. The catheter was advanced over a wire and positioned at the superior cavoatrial junction. Fluoroscopic images were obtained for documentation. Both dialysis lumens were found to aspirate and flush well. The proper amount of heparin was flushed in both lumens. The central venous lumen was flushed with normal saline. Catheter was sutured to skin. FINDINGS: 1. Right internal jugular vein is patent but there is peripheral wall thickening suggesting old thrombus in this area. Wire would not advance centrally from the right internal jugular vein and not clear if there is a central  occlusion or possibly septations from old thrombus. 2. Left internal jugular vein is widely patent. 3. Catheter tip is near the superior cavoatrial junction. IMPRESSION: Successful placement of a left jugular non-tunneled dialysis catheter using ultrasound and fluoroscopic guidance. Electronically Signed   By: Markus Daft M.D.   On: 10/04/2020 13:44    Cardiac Studies   2D echo 08/08/20   1. Left ventricular ejection fraction, by estimation, is 35 to 40%. The  left ventricle has moderately decreased function. The left ventricle  demonstrates global hypokinesis. There is mild concentric left ventricular  hypertrophy. Indeterminate diastolic  filling due to E-A fusion. Elevated left atrial pressure.  2. Right ventricular systolic function is moderately reduced. Mildly  increased right ventricular wall thickness. There is moderately elevated  pulmonary artery systolic pressure. The estimated right ventricular  systolic pressure is 24.4 mmHg.  3. Left atrial size was mildly dilated.  4. The mitral valve is normal in structure. Trivial mitral valve  regurgitation.  5. Tricuspid valve regurgitation is moderate.  6. The aortic valve is normal in structure. Aortic valve regurgitation is  mild. No aortic stenosis is present.  7. The inferior vena cava is dilated in size with <50% respiratory  variability, suggesting right atrial pressure of 15 mmHg.   Comparison(s): Prior images reviewed side by side. The left ventricular  function is significantly worse.   Patient Profile     65 y.o. female with chronic combined CHF with LVEF at 35-40% (new dx 07/2020), recent dx of atrial flutter not on anticoagulation due to endometrial cancer with bleeding dx 2021, MSSA bacteremia, HTN, HLD,esophageal immobility, DM2, CKD Stage IV/V, asthma, esophageal immobility, sacral ulcer, severe morbid obesity, and anemia of chronic disease. Seen by cardiology last admission, milrinone and ischemic workup not  pursued, not a candidate for rhythm strategy of arrhythmia due to her endometrial CA, bleeding and anemia. She was discharged to SNF with involvement of palliative care. She presented back to the ED 09/24/20 with recurrent CHF symptoms and was started on diuresis with worsening renal insufficiency and poor response despite IV Lasix/metolazone. Nephrology following and planned trial of dialysis given early uremic symptoms.  Assessment & Plan    1. Acute on chronic combined CHF - EF notably down last admission to 35-40%, no ischemic evaluation pursued suspect due to comorbidities, inability to use blood thinners even if obstruction found due to anemia/bleeding - failed to respond to diuretics so starting trial of HD today - on carvedilol, hydralazine, Imdur  - avoid ACEi/ARB/Spiro/ARNI given renal failure  2. AKI on CKD stage IV-IV - nephrology is starting trial of HD, not yet clear on long term plan - management of hypokalemia per renal team  3.  Paroxysmal atrial tachycardia ventricular rates around 120 bpm  4. Essential HTN - managed in context of the above  5. Mildly elevated troponin - hsTroponin 36-37 due to demand ischemia in setting of HF/renal failure - no chest pain  6. Other medical issues - endometrial cancer, anemia, UTI - per notes, nonoperative with goal to control bleeding and remain comfort - has been followed ongoing by palliative care team to reassess goals - mildly elevated TSH, anemia/thrombocytopenia noted - per medical team  For questions or updates, please contact [facility] Please consult [email or web address] for contact info under Cardiology/STEMI.  Signed, Charlie Pitter, PA-C 10/05/2020, 8:26 AM    The patient was seen, examined and discussed with Melina Copa, PA-C and I agree with the above.   The patient completed hemodialysis, on physical exam she has elevated JVDs, minimal crackles at her lung bases, her legs remain swollen.  Telemetry shows episodes of  atrial tach with ventricular rates 120 bpm alternating with sinus bradycardia with rates in 50s. Nephrology follows her, no plan for long-term hemodialysis yet.  Patient's MAP is about 65 mmHg, her LVEF is 35 to 40%, I do not think she would benefit from inotropes at this point also she is not a long-term candidate for advanced heart failure therapies.  I will continue current management.  Ena Dawley, MD 10/05/2020

## 2020-10-05 NOTE — Progress Notes (Signed)
[facility] Progress Note    Assessment/ Plan:   AKI on CKD4, worsening, now oliguric: Likely secondary to cardiorenal and failed medical management.   --She has been offered a trial of dialysis which she started this AM.  Palliative is following and I will work to clarify what goals of care are here as well --> patient seems to have lack of clear understanding of her malignancy and health issues. -Continue to monitor daily Cr, Dose meds for GFR<15 -Monitor Daily I/Os, Daily weight  -Maintain MAP>65 for optimal renal perfusion.  -Agree with holding ACE-I, avoid further nephrotoxins including NSAIDS, Morphine.  Unless absolutely necessary, avoid CT with contrast and/or MRI with gadolinium.     Acute on chronic combined diastolic and systolic heart failure -Cardiology following  A flutter -Not a candidate for cardioversion, anticoagulation  UTI -on rocephin, ucx pending  Endometrial carcinoma -Nonoperative management, palliative management  Hypertension -BP is acceptable at the moment  Thrombocytopenia -Monitoring, per primary  Anemia due to chronic disease and history of blood loss: -Transfuse for Hgb<7 g/dL -hgb stable, if lower then please check iron panel+ferritin  Uncontrolled Diabetes Mellitus Type 2 with Hyperglycemia -Management per primary  Left Renal Atrophy -chronic, no intervention at the moment for this  Subjective:   No acute events. S/p HD this AM and reporting feeling overall improved, cannot state what is improved.  UF 2L.    Objective:   BP (!) 132/95 (BP Location: Left Arm)   Pulse 71   Temp 97.6 F (36.4 C) (Oral)   Resp 14   Ht 4\' 11"  (1.499 m)   Wt 109.8 kg   LMP 03/17/2015   SpO2 98%   BMI 48.91 kg/m   Intake/Output Summary (Last 24 hours) at 10/05/2020 1455 Last data filed at 10/05/2020 1247 Gross per 24 hour  Intake 3 ml  Output 2552 ml  Net -2549 ml   Weight change: -1.089 kg  Physical Exam: Gen:nad,  sitting up in bed HEENT: very poor dentition CVS:s1s2, rrr Resp:cta bl WUJ:WJXB, nt/nd Ext:3+ pitting edema bl le's up to thighs Neuro: awake, alert, speech clear and coherent, moves all ext spontaneously  Imaging: DG Chest 1 View  Result Date: 10/05/2020 CLINICAL DATA:  Chest pain and CHF EXAM: CHEST  1 VIEW COMPARISON:  09/24/2020 FINDINGS: Left sided dialysis catheter tip at mild right atrium. Patient rotated left. Mild cardiomegaly. Atherosclerosis in the transverse aorta. Layering right pleural effusion is small and similar. No pneumothorax. Mild pulmonary venous congestion is new, accentuated by AP portable technique. Right greater than left base airspace disease is not significantly changed. IMPRESSION: Cardiomegaly with development of mild pulmonary venous congestion. Otherwise, similar right pleural effusion with bibasilar Airspace disease, likely atelectasis. Left-sided dialysis catheter at mid right atrium, without pneumothorax. Aortic Atherosclerosis (ICD10-I70.0). Electronically Signed   By: [name] Miyamoto M.D.   On: 10/05/2020 12:17   US RENAL  Result Date: 10/03/2020 CLINICAL DATA:  Acute kidney injury. EXAM: RENAL / URINARY TRACT ULTRASOUND COMPLETE COMPARISON:  Renal ultrasound 08/09/2020.  CT 11/07/2019 FINDINGS: Right Kidney: Renal measurements: 8.8 x 4.1 x 4.4 cm = volume: 84 mL. No hydronephrosis. There is increased renal parenchymal echogenicity. No evidence of focal lesion or stone. Left Kidney: Renal measurements: 6.7 x 4.1 x 3.3 cm = volume: 47 mL. Kidney is chronically atrophic, difficult to visualize with ultrasound. No evidence of hydronephrosis or focal renal abnormality. Bladder: Partially distended. Appears normal for degree of bladder distention. Other: Moderate volume abdominopelvic ascites. Technically limited exam  due to habitus. IMPRESSION: 1. Chronic left renal atrophy. 2. No obstructive uropathy. Mild increased right renal parenchymal echogenicity most consistent  with chronic medical renal disease. 3. Moderate abdominopelvic ascites. Electronically Signed   By: Keith Rake M.D.   On: 10/03/2020 16:26   IR Fluoro Guide CV Line Left  Result Date: 10/04/2020 INDICATION: 65 year old with acute on chronic kidney disease. EXAM: FLUOROSCOPIC AND ULTRASOUND GUIDED PLACEMENT OF A NON-TUNNELED DIALYSIS CATHETER Physician: Stephan Minister. Henn, MD MEDICATIONS: None ANESTHESIA/SEDATION: None FLUOROSCOPY TIME:  Fluoroscopy Time: 1 minutes, 42 seconds, 13 mGy COMPLICATIONS: None immediate. PROCEDURE: The procedure was explained to the patient. The risks and benefits of the procedure were discussed and the patient's questions were addressed. Informed consent was obtained from the patient. The patient was placed supine on the interventional table. Ultrasound confirmed a patent right internal jugular vein. Ultrasound images were obtained for documentation. The right neck was prepped and draped in a sterile fashion. The right neck was anesthetized with 1% lidocaine. Maximal barrier sterile technique was utilized including caps, mask, sterile gowns, sterile gloves, sterile drape, hand hygiene and skin antiseptic. A small incision was made with #11 blade scalpel. A 21 gauge needle directed into the right internal jugular vein with ultrasound guidance. A micropuncture dilator set was placed. Wire would not advance centrally. Micropuncture dilator set was removed and a bandage was placed at the puncture site. Ultrasound confirmed a patent left internal jugular vein. Ultrasound image was saved for documentation. Left side of the neck was prepped and draped in sterile fashion. Maximal barrier sterile technique was utilized including caps, mask, sterile gowns, sterile gloves, sterile drape, hand hygiene and skin antiseptic. Skin was anesthetized using 1% lidocaine. Small incision was made. Using ultrasound guidance, 21 gauge needle was directed into the left internal jugular vein and wire easily  advanced centrally. Micropuncture dilator set was placed. J wire was placed. Tract was dilated. A 20 cm Mahurkar catheter was selected. The catheter was advanced over a wire and positioned at the superior cavoatrial junction. Fluoroscopic images were obtained for documentation. Both dialysis lumens were found to aspirate and flush well. The proper amount of heparin was flushed in both lumens. The central venous lumen was flushed with normal saline. Catheter was sutured to skin. FINDINGS: 1. Right internal jugular vein is patent but there is peripheral wall thickening suggesting old thrombus in this area. Wire would not advance centrally from the right internal jugular vein and not clear if there is a central occlusion or possibly septations from old thrombus. 2. Left internal jugular vein is widely patent. 3. Catheter tip is near the superior cavoatrial junction. IMPRESSION: Successful placement of a left jugular non-tunneled dialysis catheter using ultrasound and fluoroscopic guidance. Electronically Signed   By: Markus Daft M.D.   On: 10/04/2020 13:44   IR US Guide Vasc Access Left  Result Date: 10/04/2020 INDICATION: 65 year old with acute on chronic kidney disease. EXAM: FLUOROSCOPIC AND ULTRASOUND GUIDED PLACEMENT OF A NON-TUNNELED DIALYSIS CATHETER Physician: Stephan Minister. Henn, MD MEDICATIONS: None ANESTHESIA/SEDATION: None FLUOROSCOPY TIME:  Fluoroscopy Time: 1 minutes, 42 seconds, 13 mGy COMPLICATIONS: None immediate. PROCEDURE: The procedure was explained to the patient. The risks and benefits of the procedure were discussed and the patient's questions were addressed. Informed consent was obtained from the patient. The patient was placed supine on the interventional table. Ultrasound confirmed a patent right internal jugular vein. Ultrasound images were obtained for documentation. The right neck was prepped and draped in a sterile fashion. The right  neck was anesthetized with 1% lidocaine. Maximal barrier  sterile technique was utilized including caps, mask, sterile gowns, sterile gloves, sterile drape, hand hygiene and skin antiseptic. A small incision was made with #11 blade scalpel. A 21 gauge needle directed into the right internal jugular vein with ultrasound guidance. A micropuncture dilator set was placed. Wire would not advance centrally. Micropuncture dilator set was removed and a bandage was placed at the puncture site. Ultrasound confirmed a patent left internal jugular vein. Ultrasound image was saved for documentation. Left side of the neck was prepped and draped in sterile fashion. Maximal barrier sterile technique was utilized including caps, mask, sterile gowns, sterile gloves, sterile drape, hand hygiene and skin antiseptic. Skin was anesthetized using 1% lidocaine. Small incision was made. Using ultrasound guidance, 21 gauge needle was directed into the left internal jugular vein and wire easily advanced centrally. Micropuncture dilator set was placed. J wire was placed. Tract was dilated. A 20 cm Mahurkar catheter was selected. The catheter was advanced over a wire and positioned at the superior cavoatrial junction. Fluoroscopic images were obtained for documentation. Both dialysis lumens were found to aspirate and flush well. The proper amount of heparin was flushed in both lumens. The central venous lumen was flushed with normal saline. Catheter was sutured to skin. FINDINGS: 1. Right internal jugular vein is patent but there is peripheral wall thickening suggesting old thrombus in this area. Wire would not advance centrally from the right internal jugular vein and not clear if there is a central occlusion or possibly septations from old thrombus. 2. Left internal jugular vein is widely patent. 3. Catheter tip is near the superior cavoatrial junction. IMPRESSION: Successful placement of a left jugular non-tunneled dialysis catheter using ultrasound and fluoroscopic guidance. Electronically Signed    By: Markus Daft M.D.   On: 10/04/2020 13:44    Labs: BMET Recent Labs  Lab 09/29/20 0234 09/30/20 0300 10/01/20 0232 10/02/20 0305 10/03/20 0111 10/04/20 0335 10/05/20 0157 10/05/20 1221  NA 143 142 142 143 143 142 143 141  K 3.5 3.8 3.9 3.9 4.2 3.6 3.4* 3.7  CL 106 106 107 106 109 105 106 104  CO2 27 24 25 24 23 25 26 25   GLUCOSE 110* 138* 83 80 72 94 135* 103*  BUN 94* 98* 100* 105* 109* 115* 114* 85*  CREATININE 3.76* 3.77* 3.81* 3.98* 3.98* 4.11* 4.03* 3.26*  CALCIUM 8.0* 8.2* 8.2* 8.4* 8.4* 8.3* 8.3* 8.6*  PHOS 5.7*  --   --   --  5.5*  --   --  4.5   CBC Recent Labs  Lab 10/01/20 0232 10/02/20 0305 10/03/20 0111 10/05/20 0157 10/05/20 1221  WBC 3.4* 2.5* 2.9*  --  2.4*  HGB 10.4* 10.4* 10.4* 11.0* 10.4*  HCT 31.8* 31.8* 33.1* 35.6* 32.6*  MCV 88.8 88.8 90.7  --  90.1  PLT 97* 103* 109*  --  78*    Medications:    . carvedilol  25 mg Oral BID WC  . Chlorhexidine Gluconate Cloth  6 each Topical Q0600  . ferrous sulfate  325 mg Oral BID WC  . heparin sodium (porcine)      . hydrALAZINE  50 mg Oral Q8H  . hydrocortisone cream   Topical BID  . isosorbide mononitrate  30 mg Oral Daily  . nutrition supplement (JUVEN)  1 packet Oral BID BM  . potassium chloride SA  10 mEq Oral BID  . sodium chloride flush  3 mL Intravenous Q12H  Jannifer Hick MD [facility] Pager [phone]

## 2020-10-05 NOTE — Progress Notes (Signed)
PT Cancellation Note  Patient Details Name: Vanessa [name] MRN: [id number] DOB: Aug 26, 1955   Cancelled Treatment:    Reason Eval/Treat Not Completed: Patient at procedure or test/unavailable (HD). Will follow-up for PT treatment as schedule permits.  Mabeline Caras, PT, DPT Acute Rehabilitation Services  Pager [phone] Office [place] 10/05/2020, 7:34 AM

## 2020-10-05 NOTE — Progress Notes (Signed)
PROGRESS NOTE    Vanessa [name]  ZOX:[id number] DOB: 03/26/1956 DOA: 09/24/2020 PCP: Patient, No Pcp Per   Brief Narrative: This 65 years old female with PMH significant for chronic combined systolic and diastolic CHF(EF 35 to 81% by TTE 08/08/20), atrial fibrillation not on anticoagulation due to chronic bleeding from endometrial cancer, CKD stage IV, type II DM, hypertension, anemia of chronic disease, history of MSSA bacteremia, stage I endometrial cancer status post palliative radiation presented to the ED from SNF with shortness of breath and peripheral edema. Patient recently admitted from 08/07/2020-08/18/2020 for acute on chronic combined systolic and diastolic CHF exacerbation, AKI on CKD stage IV, and new onset A. fib with RVR.  Patient was managed with IV diuresis before transition to oral torsemide. A. fib/flutter was controlled with Coreg. Anticoagulation not started due to high risk of bleeding.  Patient was ultimately discharged to SNF with palliative care. Patient is admitted for acute on chronic CHF exacerbation,  started on diuresis.  Cardiology and nephrology has been consulted.  Patient continued to remain fluid overloaded.  Patient was started on metolazone, Patient is having cardiorenal syndrome,  Nephrology is planning short-term dialysis trial.  Assessment & Plan:   Principal Problem:   Acute on chronic combined systolic (congestive) and diastolic (congestive) heart failure (HCC) Active Problems:   HTN (hypertension)   Thrombocytopenia (HCC)   Acute kidney injury superimposed on CKD (HCC)   Acute on chronic combined systolic and diastolic heart failure ([place]) -She presented with progressive peripheral edema, orthopnea, DOE, BNP 1724. -2D echo 07/2020 had shown EF of 35 to 40%, -Continued IV Lasix 40 mg twice daily, received Lasix 60 mg IV x1 in ED -Continue strict I's and O's and daily weights -Continue Coreg, not on ACE/ARB/spironolactone due to renal  dysfunction 09/27/2020: Increased the dose of IV Lasix from 40 mg twice daily to 80 mg twice daily.   Continue to monitor renal function and electrolytes.  Strict I's and O's.  Cardiac diet.  Avoid excessive sodium intake.   3/25: She continued to remain fluid overloaded, cardiology consulted. 3/25 : Patient was started on metolazone, Continue Lasix 120 mg BID, -Patient is having cardiorenal syndrome,  Nephrology is planning short-term dialysis trial. 3/27 :Patient underwent temporary dialysis catheter. Initiation of HD 3/28 - 3/28: Patient underwent hemodialysis, feels improved.  Not clear about long-term hemodialysis.  Active problems : Acute kidney injury superimposed on CKD stage IV -Likely worsened due to acute on chronic CHF, baseline creatinine 3.6-3.7 -Presented with creatinine of 4.3, continue diuresis and monitor renal function. -Creatinine worsening 3.26 today.  - Continue to monitor renal function, electrolytes, I's and O's.  3/25: Nephrology consulted,  Seems refractory to diuretics at this time and showing early uremic symptoms,   - Patient is heading towards RRT,  IR consulted for Temporary dialysis catheter. - Initiation of hemodialysis 3/28.  Not clear about long-term hemodialysis.   Paroxysmal atrial fibrillation -Continue Coreg 25 mg twice daily, HR slightly elevated in low 100's, in atrial fibrillation -Not on anticoagulation due to high risk of bleeding.   Essential hypertension -BP stable, continue Coreg, hydralazine, Imdur. -Continue IV Lasix   Thrombocytopenia -Unclear etiology, mild,  no active bleeding, monitor counts.   History of endometrial cancer with chronic abnormal uterine bleeding Anemia due to chronic blood loss and CKD stage IV -Baseline ~9 Currently stable, 10.6- 10.8. -Plan nonoperative management with the goal to control bleeding and remain comfort.   UTI: UA consistent with UTI Continue ceftriaxone.  Follow-up  urine cultures    Obesity Estimated body mass index is 44.17 kg/m as calculated from the following:   Height as of this encounter: 4\' 11"  (1.499 m).   Weight as of this encounter: 99.2 kg.   DVT prophylaxis: SCDs Code Status:DNR Family Communication: No family at bed side. Disposition Plan:   Status is: Inpatient  Remains inpatient appropriate because:Inpatient level of care appropriate due to severity of illness   Dispo: The patient is from: Home              Anticipated d/c is to: SNF               Patient currently is  Not medically stable for dc   Difficult to place patient No  Consultants:    None  Procedures:  Antimicrobials:  Anti-infectives (From admission, onward)   Start     Dose/Rate Route Frequency Ordered Stop   10/03/20 0930  cefTRIAXone (ROCEPHIN) 1 g in sodium chloride 0.9 % 100 mL IVPB        1 g 200 mL/hr over 30 Minutes Intravenous Every 24 hours 10/03/20 7628        Subjective: Patient was seen and examined at HD suite.  Overnight events noted.   She reports feeling sad, having HD today , not sure whether she can continue HD. She denies any chest pain or shortness of breath. She has Dialysis catheter in Left neck  Objective: Vitals:   10/05/20 1038 10/05/20 1244 10/05/20 1529 10/05/20 1540  BP: 136/87 (!) 132/95 104/72 104/72  Pulse: (!) 56 71 (!) 106   Resp: 13 14 14    Temp:      TempSrc:   Axillary   SpO2: 100% 98% 96%   Weight:      Height:        Intake/Output Summary (Last 24 hours) at 10/05/2020 1550 Last data filed at 10/05/2020 1247 Gross per 24 hour  Intake 3 ml  Output 2552 ml  Net -2549 ml   Filed Weights   10/04/20 0617 10/05/20 0531 10/05/20 0652  Weight: 101.4 kg 100.3 kg 109.8 kg    Examination:  General exam: Appears calm and comfortable , not in any acute distress Respiratory system: Clear to auscultation. Respiratory effort normal. Cardiovascular system: S1 & S2 heard, RRR. No JVD, murmurs, rubs, gallops or clicks. No pedal  edema. Gastrointestinal system: Abdomen is nondistended, soft and nontender. No organomegaly or masses felt. Normal bowel sounds heard. Central nervous system: Alert and oriented. No focal neurological deficits. Extremities: Symmetric 5 x 5 power.  Bilateral pedal edema++ Skin: No rashes, lesions or ulcers Psychiatry: Judgement and insight appear normal. Mood & affect appropriate.     Data Reviewed: I have personally reviewed following labs and imaging studies  CBC: Recent Labs  Lab 09/30/20 0300 10/01/20 0232 10/02/20 0305 10/03/20 0111 10/05/20 0157 10/05/20 1221  WBC 4.0 3.4* 2.5* 2.9*  --  2.4*  HGB 10.8* 10.4* 10.4* 10.4* 11.0* 10.4*  HCT 33.3* 31.8* 31.8* 33.1* 35.6* 32.6*  MCV 90.0 88.8 88.8 90.7  --  90.1  PLT 103* 97* 103* 109*  --  78*   Basic Metabolic Panel: Recent Labs  Lab 09/29/20 0234 09/30/20 0300 10/02/20 0305 10/03/20 0111 10/04/20 0335 10/05/20 0157 10/05/20 1221  NA 143   < > 143 143 142 143 141  K 3.5   < > 3.9 4.2 3.6 3.4* 3.7  CL 106   < > 106 109 105 106 104  CO2 27   < >  24 23 25 26 25   GLUCOSE 110*   < > 80 72 94 135* 103*  BUN 94*   < > 105* 109* 115* 114* 85*  CREATININE 3.76*   < > 3.98* 3.98* 4.11* 4.03* 3.26*  CALCIUM 8.0*   < > 8.4* 8.4* 8.3* 8.3* 8.6*  MG 2.1  --   --  2.2  --   --   --   PHOS 5.7*  --   --  5.5*  --   --  4.5   < > = values in this interval not displayed.   GFR: Estimated Creatinine Clearance: 18.7 mL/min (A) (by C-G formula based on SCr of 3.26 mg/dL (H)). Liver Function Tests: Recent Labs  Lab 10/05/20 1221  ALBUMIN 2.7*   No results for input(s): LIPASE, AMYLASE in the last 168 hours. No results for input(s): AMMONIA in the last 168 hours. Coagulation Profile: No results for input(s): INR, PROTIME in the last 168 hours. Cardiac Enzymes: No results for input(s): CKTOTAL, CKMB, CKMBINDEX, TROPONINI in the last 168 hours. BNP (last 3 results) No results for input(s): PROBNP in the last 8760  hours. HbA1C: No results for input(s): HGBA1C in the last 72 hours. CBG: Recent Labs  Lab 10/02/20 0059 10/02/20 1556 10/03/20 0819  GLUCAP 99 136* 88   Lipid Profile: No results for input(s): CHOL, HDL, LDLCALC, TRIG, CHOLHDL, LDLDIRECT in the last 72 hours. Thyroid Function Tests: No results for input(s): TSH, T4TOTAL, FREET4, T3FREE, THYROIDAB in the last 72 hours. Anemia Panel: No results for input(s): VITAMINB12, FOLATE, FERRITIN, TIBC, IRON, RETICCTPCT in the last 72 hours. Sepsis Labs: No results for input(s): PROCALCITON, LATICACIDVEN in the last 168 hours.  Recent Results (from the past 240 hour(s))  MRSA PCR Screening     Status: Abnormal   Collection Time: 09/26/20 10:14 AM   Specimen: Nasopharyngeal  Result Value Ref Range Status   MRSA by PCR POSITIVE (A) NEGATIVE Final    Comment:        The GeneXpert MRSA Assay (FDA approved for NASAL specimens only), is one component of a comprehensive MRSA colonization surveillance program. It is not intended to diagnose MRSA infection nor to guide or monitor treatment for MRSA infections. RESULT CALLED TO, READ BACK BY AND VERIFIED WITH: F,VEATTH @1317  09/26/20 EB Performed at [facility], [place] [address], [place], [place] [address]   Resp Panel by RT-PCR (Flu A&B, Covid) Nasopharyngeal Swab     Status: None   Collection Time: 10/01/20 11:03 AM   Specimen: Nasopharyngeal Swab; Nasopharyngeal(NP) swabs in vial transport medium  Result Value Ref Range Status   SARS Coronavirus 2 by RT PCR NEGATIVE NEGATIVE Final    Comment: (NOTE) SARS-CoV-2 target nucleic acids are NOT DETECTED.  The SARS-CoV-2 RNA is generally detectable in upper respiratory specimens during the acute phase of infection. The lowest concentration of SARS-CoV-2 viral copies this assay can detect is 138 copies/mL. A negative result does not preclude SARS-Cov-2 infection and should not be used as the sole basis for treatment or other patient  management decisions. A negative result may occur with  improper specimen collection/handling, submission of specimen other than nasopharyngeal swab, presence of viral mutation(s) within the areas targeted by this assay, and inadequate number of viral copies(<138 copies/mL). A negative result must be combined with clinical observations, patient history, and epidemiological information. The expected result is Negative.  Fact Sheet for Patients:  EntrepreneurPulse.com.au  Fact Sheet for Healthcare Providers:  IncredibleEmployment.be  This test  is no t yet approved or cleared by the [place] and  has been authorized for detection and/or diagnosis of SARS-CoV-2 by FDA under an Emergency Use Authorization (EUA). This EUA will remain  in effect (meaning this test can be used) for the duration of the COVID-19 declaration under Section 564(b)(1) of the Act, 21 U.S.C.section 360bbb-3(b)(1), unless the authorization is terminated  or revoked sooner.       Influenza A by PCR NEGATIVE NEGATIVE Final   Influenza B by PCR NEGATIVE NEGATIVE Final    Comment: (NOTE) The Xpert Xpress SARS-CoV-2/FLU/RSV plus assay is intended as an aid in the diagnosis of influenza from Nasopharyngeal swab specimens and should not be used as a sole basis for treatment. Nasal washings and aspirates are unacceptable for Xpert Xpress SARS-CoV-2/FLU/RSV testing.  Fact Sheet for Patients: EntrepreneurPulse.com.au  Fact Sheet for Healthcare Providers: IncredibleEmployment.be  This test is not yet approved or cleared by the [place] FDA and has been authorized for detection and/or diagnosis of SARS-CoV-2 by FDA under an Emergency Use Authorization (EUA). This EUA will remain in effect (meaning this test can be used) for the duration of the COVID-19 declaration under Section 564(b)(1) of the Act, 21 U.S.C. section 360bbb-3(b)(1),  unless the authorization is terminated or revoked.  Performed at [facility], [place] [address], [place], [place] [address]   Culture, Urine     Status: Abnormal (Preliminary result)   Collection Time: 10/03/20  6:00 PM   Specimen: Urine, Random  Result Value Ref Range Status   Specimen Description URINE, RANDOM  Final   Special Requests NONE  Final   Culture (A)  Final    70,000 COLONIES/mL ESCHERICHIA COLI SUSCEPTIBILITIES TO FOLLOW Performed at [facility], [place] [address] [place], [place] [address]    Report Status PENDING  Incomplete     Radiology Studies: DG Chest 1 View  Result Date: 10/05/2020 CLINICAL DATA:  Chest pain and CHF EXAM: CHEST  1 VIEW COMPARISON:  09/24/2020 FINDINGS: Left sided dialysis catheter tip at mild right atrium. Patient rotated left. Mild cardiomegaly. Atherosclerosis in the transverse aorta. Layering right pleural effusion is small and similar. No pneumothorax. Mild pulmonary venous congestion is new, accentuated by AP portable technique. Right greater than left base airspace disease is not significantly changed. IMPRESSION: Cardiomegaly with development of mild pulmonary venous congestion. Otherwise, similar right pleural effusion with bibasilar Airspace disease, likely atelectasis. Left-sided dialysis catheter at mid right atrium, without pneumothorax. Aortic Atherosclerosis (ICD10-I70.0). Electronically Signed   By: [name] Miyamoto M.D.   On: 10/05/2020 12:17   US RENAL  Result Date: 10/03/2020 CLINICAL DATA:  Acute kidney injury. EXAM: RENAL / URINARY TRACT ULTRASOUND COMPLETE COMPARISON:  Renal ultrasound 08/09/2020.  CT 11/07/2019 FINDINGS: Right Kidney: Renal measurements: 8.8 x 4.1 x 4.4 cm = volume: 84 mL. No hydronephrosis. There is increased renal parenchymal echogenicity. No evidence of focal lesion or stone. Left Kidney: Renal measurements: 6.7 x 4.1 x 3.3 cm = volume: 47 mL. Kidney is chronically atrophic, difficult to visualize with  ultrasound. No evidence of hydronephrosis or focal renal abnormality. Bladder: Partially distended. Appears normal for degree of bladder distention. Other: Moderate volume abdominopelvic ascites. Technically limited exam due to habitus. IMPRESSION: 1. Chronic left renal atrophy. 2. No obstructive uropathy. Mild increased right renal parenchymal echogenicity most consistent with chronic medical renal disease. 3. Moderate abdominopelvic ascites. Electronically Signed   By: Keith Rake M.D.   On: 10/03/2020 16:26   IR  Fluoro Guide CV Line Left  Result Date: 10/04/2020 INDICATION: 65 year old with acute on chronic kidney disease. EXAM: FLUOROSCOPIC AND ULTRASOUND GUIDED PLACEMENT OF A NON-TUNNELED DIALYSIS CATHETER Physician: Stephan Minister. Henn, MD MEDICATIONS: None ANESTHESIA/SEDATION: None FLUOROSCOPY TIME:  Fluoroscopy Time: 1 minutes, 42 seconds, 13 mGy COMPLICATIONS: None immediate. PROCEDURE: The procedure was explained to the patient. The risks and benefits of the procedure were discussed and the patient's questions were addressed. Informed consent was obtained from the patient. The patient was placed supine on the interventional table. Ultrasound confirmed a patent right internal jugular vein. Ultrasound images were obtained for documentation. The right neck was prepped and draped in a sterile fashion. The right neck was anesthetized with 1% lidocaine. Maximal barrier sterile technique was utilized including caps, mask, sterile gowns, sterile gloves, sterile drape, hand hygiene and skin antiseptic. A small incision was made with #11 blade scalpel. A 21 gauge needle directed into the right internal jugular vein with ultrasound guidance. A micropuncture dilator set was placed. Wire would not advance centrally. Micropuncture dilator set was removed and a bandage was placed at the puncture site. Ultrasound confirmed a patent left internal jugular vein. Ultrasound image was saved for documentation. Left side of  the neck was prepped and draped in sterile fashion. Maximal barrier sterile technique was utilized including caps, mask, sterile gowns, sterile gloves, sterile drape, hand hygiene and skin antiseptic. Skin was anesthetized using 1% lidocaine. Small incision was made. Using ultrasound guidance, 21 gauge needle was directed into the left internal jugular vein and wire easily advanced centrally. Micropuncture dilator set was placed. J wire was placed. Tract was dilated. A 20 cm Mahurkar catheter was selected. The catheter was advanced over a wire and positioned at the superior cavoatrial junction. Fluoroscopic images were obtained for documentation. Both dialysis lumens were found to aspirate and flush well. The proper amount of heparin was flushed in both lumens. The central venous lumen was flushed with normal saline. Catheter was sutured to skin. FINDINGS: 1. Right internal jugular vein is patent but there is peripheral wall thickening suggesting old thrombus in this area. Wire would not advance centrally from the right internal jugular vein and not clear if there is a central occlusion or possibly septations from old thrombus. 2. Left internal jugular vein is widely patent. 3. Catheter tip is near the superior cavoatrial junction. IMPRESSION: Successful placement of a left jugular non-tunneled dialysis catheter using ultrasound and fluoroscopic guidance. Electronically Signed   By: Markus Daft M.D.   On: 10/04/2020 13:44   IR US Guide Vasc Access Left  Result Date: 10/04/2020 INDICATION: 65 year old with acute on chronic kidney disease. EXAM: FLUOROSCOPIC AND ULTRASOUND GUIDED PLACEMENT OF A NON-TUNNELED DIALYSIS CATHETER Physician: Stephan Minister. Henn, MD MEDICATIONS: None ANESTHESIA/SEDATION: None FLUOROSCOPY TIME:  Fluoroscopy Time: 1 minutes, 42 seconds, 13 mGy COMPLICATIONS: None immediate. PROCEDURE: The procedure was explained to the patient. The risks and benefits of the procedure were discussed and the  patient's questions were addressed. Informed consent was obtained from the patient. The patient was placed supine on the interventional table. Ultrasound confirmed a patent right internal jugular vein. Ultrasound images were obtained for documentation. The right neck was prepped and draped in a sterile fashion. The right neck was anesthetized with 1% lidocaine. Maximal barrier sterile technique was utilized including caps, mask, sterile gowns, sterile gloves, sterile drape, hand hygiene and skin antiseptic. A small incision was made with #11 blade scalpel. A 21 gauge needle directed into the right internal jugular vein with  ultrasound guidance. A micropuncture dilator set was placed. Wire would not advance centrally. Micropuncture dilator set was removed and a bandage was placed at the puncture site. Ultrasound confirmed a patent left internal jugular vein. Ultrasound image was saved for documentation. Left side of the neck was prepped and draped in sterile fashion. Maximal barrier sterile technique was utilized including caps, mask, sterile gowns, sterile gloves, sterile drape, hand hygiene and skin antiseptic. Skin was anesthetized using 1% lidocaine. Small incision was made. Using ultrasound guidance, 21 gauge needle was directed into the left internal jugular vein and wire easily advanced centrally. Micropuncture dilator set was placed. J wire was placed. Tract was dilated. A 20 cm Mahurkar catheter was selected. The catheter was advanced over a wire and positioned at the superior cavoatrial junction. Fluoroscopic images were obtained for documentation. Both dialysis lumens were found to aspirate and flush well. The proper amount of heparin was flushed in both lumens. The central venous lumen was flushed with normal saline. Catheter was sutured to skin. FINDINGS: 1. Right internal jugular vein is patent but there is peripheral wall thickening suggesting old thrombus in this area. Wire would not advance centrally  from the right internal jugular vein and not clear if there is a central occlusion or possibly septations from old thrombus. 2. Left internal jugular vein is widely patent. 3. Catheter tip is near the superior cavoatrial junction. IMPRESSION: Successful placement of a left jugular non-tunneled dialysis catheter using ultrasound and fluoroscopic guidance. Electronically Signed   By: Markus Daft M.D.   On: 10/04/2020 13:44    Scheduled Meds: . carvedilol  25 mg Oral BID WC  . Chlorhexidine Gluconate Cloth  6 each Topical Q0600  . ferrous sulfate  325 mg Oral BID WC  . heparin sodium (porcine)      . hydrALAZINE  50 mg Oral Q8H  . hydrocortisone cream   Topical BID  . isosorbide mononitrate  30 mg Oral Daily  . nutrition supplement (JUVEN)  1 packet Oral BID BM  . potassium chloride SA  10 mEq Oral BID  . sodium chloride flush  3 mL Intravenous Q12H   Continuous Infusions: . sodium chloride    . sodium chloride    . cefTRIAXone (ROCEPHIN)  IV 1 g (10/05/20 1237)  . furosemide 120 mg (10/05/20 1118)     LOS: 10 days    Time spent: 25 mins    Mayrani Khamis, MD [facility]   If 7PM-7AM, please contact night-coverage

## 2020-10-06 DIAGNOSIS — Z7189 Other specified counseling: Secondary | ICD-10-CM | POA: Diagnosis not present

## 2020-10-06 DIAGNOSIS — Z515 Encounter for palliative care: Secondary | ICD-10-CM

## 2020-10-06 DIAGNOSIS — I5043 Acute on chronic combined systolic (congestive) and diastolic (congestive) heart failure: Secondary | ICD-10-CM | POA: Diagnosis not present

## 2020-10-06 DIAGNOSIS — I10 Essential (primary) hypertension: Secondary | ICD-10-CM | POA: Diagnosis not present

## 2020-10-06 DIAGNOSIS — I50811 Acute right heart failure: Secondary | ICD-10-CM

## 2020-10-06 DIAGNOSIS — N179 Acute kidney failure, unspecified: Secondary | ICD-10-CM | POA: Diagnosis not present

## 2020-10-06 LAB — CBC
HCT: 33.2 % — ABNORMAL LOW (ref 36.0–46.0)
Hemoglobin: 10.7 g/dL — ABNORMAL LOW (ref 12.0–15.0)
MCH: 28.8 pg (ref 26.0–34.0)
MCHC: 32.2 g/dL (ref 30.0–36.0)
MCV: 89.5 fL (ref 80.0–100.0)
Platelets: 79 10*3/uL — ABNORMAL LOW (ref 150–400)
RBC: 3.71 MIL/uL — ABNORMAL LOW (ref 3.87–5.11)
RDW: 18.2 % — ABNORMAL HIGH (ref 11.5–15.5)
WBC: 2.9 10*3/uL — ABNORMAL LOW (ref 4.0–10.5)
nRBC: 2.8 % — ABNORMAL HIGH (ref 0.0–0.2)

## 2020-10-06 LAB — URINE CULTURE: Culture: 70000 — AB

## 2020-10-06 LAB — BASIC METABOLIC PANEL
Anion gap: 10 (ref 5–15)
BUN: 90 mg/dL — ABNORMAL HIGH (ref 8–23)
CO2: 27 mmol/L (ref 22–32)
Calcium: 8.4 mg/dL — ABNORMAL LOW (ref 8.9–10.3)
Chloride: 105 mmol/L (ref 98–111)
Creatinine, Ser: 3.47 mg/dL — ABNORMAL HIGH (ref 0.44–1.00)
GFR, Estimated: 14 mL/min — ABNORMAL LOW (ref >60)
Glucose, Bld: 116 mg/dL — ABNORMAL HIGH (ref 70–99)
Potassium: 3.6 mmol/L (ref 3.5–5.1)
Sodium: 142 mmol/L (ref 135–145)

## 2020-10-06 LAB — GLUCOSE, CAPILLARY: Glucose-Capillary: 93 mg/dL (ref 70–99)

## 2020-10-06 MED ORDER — HEPARIN SODIUM (PORCINE) 1000 UNIT/ML IJ SOLN
INTRAMUSCULAR | Status: AC
  Filled 2020-10-06: qty 1

## 2020-10-06 MED ORDER — SODIUM CHLORIDE 0.9 % IV SOLN
500.0000 mg | INTRAVENOUS | Status: DC
  Administered 2020-10-06 – 2020-10-11 (×6): 500 mg via INTRAVENOUS
  Filled 2020-10-06 (×8): qty 0.5

## 2020-10-06 MED ORDER — FOSFOMYCIN TROMETHAMINE 3 G PO PACK
3.0000 g | PACK | Freq: Once | ORAL | Status: AC
  Administered 2020-10-06: 3 g via ORAL
  Filled 2020-10-06 (×2): qty 3

## 2020-10-06 NOTE — Progress Notes (Signed)
[facility] Progress Note    Assessment/ Plan:   AKI on CKD4, worsening, now oliguric: Likely secondary to cardiorenal and failed medical management.   --She has been offered a trial of dialysis which she started 3/28.  Palliative is following and I will work to clarify what goals of care are here as well --> patient seems to have lack of clear understanding of her malignancy and health issues.  I think she is a very poor long term dialysis candidate.  -HD #2 today -Continue to monitor daily Cr, Dose meds for GFR<15 -Monitor Daily I/Os, Daily weight  -Maintain MAP>65 for optimal renal perfusion.  -Agree with holding ACE-I, avoid further nephrotoxins including NSAIDS, Morphine.  Unless absolutely necessary, avoid CT with contrast and/or MRI with gadolinium.     Acute on chronic combined diastolic and systolic heart failure -Cardiology following  A flutter -Not a candidate for cardioversion, anticoagulation  UTI -on rocephin, ucx pending  Endometrial carcinoma -Nonoperative management, palliative management  Hypertension -BP is acceptable at the moment  Thrombocytopenia -Monitoring, per primary  Anemia due to chronic disease and history of blood loss: -Transfuse for Hgb<7 g/dL -hgb stable, if lower then please check iron panel+ferritin  Uncontrolled Diabetes Mellitus Type 2 with Hyperglycemia -Management per primary  Left Renal Atrophy -chronic, no intervention at the moment for this  Subjective:   No acute events. S/p HD yest and reporting feeling overall improved, cannot state what is improved.  Still with edema.    Objective:   BP 118/88 (BP Location: Left Arm)   Pulse (!) 52   Temp 97.6 F (36.4 C) (Oral)   Resp 10   Ht 4\' 11"  (1.499 m)   Wt 97.8 kg   LMP 03/17/2015   SpO2 99%   BMI 43.55 kg/m   Intake/Output Summary (Last 24 hours) at 10/06/2020 1635 Last data filed at 10/06/2020 1100 Gross per 24 hour  Intake 480 ml  Output 800 ml   Net -320 ml   Weight change: 7.364 kg  Physical Exam: Gen:nad, sitting up in bed HEENT: very poor dentition CVS:s1s2, rrr Resp:cta bl VOZ:DGUY, nt/nd Ext:3+ pitting edema bl le's up to thighs Neuro: awake, alert, speech clear and coherent, moves all ext spontaneously  Imaging: DG Chest 1 View  Result Date: 10/05/2020 CLINICAL DATA:  Chest pain and CHF EXAM: CHEST  1 VIEW COMPARISON:  09/24/2020 FINDINGS: Left sided dialysis catheter tip at mild right atrium. Patient rotated left. Mild cardiomegaly. Atherosclerosis in the transverse aorta. Layering right pleural effusion is small and similar. No pneumothorax. Mild pulmonary venous congestion is new, accentuated by AP portable technique. Right greater than left base airspace disease is not significantly changed. IMPRESSION: Cardiomegaly with development of mild pulmonary venous congestion. Otherwise, similar right pleural effusion with bibasilar Airspace disease, likely atelectasis. Left-sided dialysis catheter at mid right atrium, without pneumothorax. Aortic Atherosclerosis (ICD10-I70.0). Electronically Signed   By: [name] Miyamoto M.D.   On: 10/05/2020 12:17    Labs: BMET Recent Labs  Lab 10/01/20 0232 10/02/20 0305 10/03/20 0111 10/04/20 0335 10/05/20 0157 10/05/20 1221 10/06/20 0252  NA 142 143 143 142 143 141 142  K 3.9 3.9 4.2 3.6 3.4* 3.7 3.6  CL 107 106 109 105 106 104 105  CO2 25 24 23 25 26 25 27   GLUCOSE 83 80 72 94 135* 103* 116*  BUN 100* 105* 109* 115* 114* 85* 90*  CREATININE 3.81* 3.98* 3.98* 4.11* 4.03* 3.26* 3.47*  CALCIUM 8.2* 8.4* 8.4* 8.3* 8.3*  8.6* 8.4*  PHOS  --   --  5.5*  --   --  4.5  --    CBC Recent Labs  Lab 10/02/20 0305 10/03/20 0111 10/05/20 0157 10/05/20 1221 10/06/20 0252  WBC 2.5* 2.9*  --  2.4* 2.9*  HGB 10.4* 10.4* 11.0* 10.4* 10.7*  HCT 31.8* 33.1* 35.6* 32.6* 33.2*  MCV 88.8 90.7  --  90.1 89.5  PLT 103* 109*  --  78* 79*    Medications:    . carvedilol  25 mg Oral BID WC   . Chlorhexidine Gluconate Cloth  6 each Topical Q0600  . ferrous sulfate  325 mg Oral BID WC  . hydrALAZINE  50 mg Oral Q8H  . hydrocortisone cream   Topical BID  . isosorbide mononitrate  30 mg Oral Daily  . nutrition supplement (JUVEN)  1 packet Oral BID BM  . potassium chloride SA  10 mEq Oral BID  . sodium chloride flush  3 mL Intravenous Q12H     Jannifer Hick MD [facility] Pager [phone]

## 2020-10-06 NOTE — Progress Notes (Addendum)
PROGRESS NOTE    Vanessa [name]  QQI:[id number] DOB: 1955-09-16 DOA: 09/24/2020 PCP: Patient, No Pcp Per (Inactive)   Brief Narrative: This 65 years old female with PMH significant for chronic combined systolic and diastolic CHF(EF 35 to 94% by TTE 08/08/20), atrial fibrillation not on anticoagulation due to chronic bleeding from endometrial cancer, CKD stage IV, type II DM, hypertension, anemia of chronic disease, history of MSSA bacteremia, stage I endometrial cancer status post palliative radiation presented to the ED from SNF with shortness of breath and peripheral edema. Patient recently admitted from 08/07/2020-08/18/2020 for acute on chronic combined systolic and diastolic CHF exacerbation, AKI on CKD stage IV, and new onset A. fib with RVR.  Patient was managed with IV diuresis before transition to oral torsemide. A. fib/flutter was controlled with Coreg. Anticoagulation not started due to high risk of bleeding.  Patient was ultimately discharged to SNF with palliative care. Patient is admitted for acute on chronic CHF exacerbation,  started on diuresis.  Cardiology and nephrology has been consulted.  Patient continued to remain fluid overloaded.  Patient was started on metolazone, Patient is having cardiorenal syndrome,  Nephrology is planning short-term dialysis trial.  Assessment & Plan:   Principal Problem:   Acute on chronic combined systolic (congestive) and diastolic (congestive) heart failure (HCC) Active Problems:   HTN (hypertension)   Thrombocytopenia (HCC)   Acute kidney injury superimposed on CKD ([place])   Acute right-sided heart failure (HCC)   Acute on chronic combined systolic and diastolic heart failure ([place]) -She presented with progressive peripheral edema, orthopnea, DOE, BNP 1724. -2D echo 07/2020 had shown EF of 35 to 40%, -Continued IV Lasix 40 mg twice daily, received Lasix 60 mg IV x1 in ED -Continue strict I's and O's and daily weights -Continue Coreg, not on  ACE/ARB/spironolactone due to renal dysfunction - 09/27/2020: Increased the dose of IV Lasix from 40 mg twice daily to 80 mg twice daily.   - Continue to monitor renal function and electrolytes.  Strict I's and O's.  Cardiac diet.  Avoid excessive sodium intake.   -3/25: She continued to remain fluid overloaded, cardiology consulted. -3/25 : Patient was started on metolazone, Continue Lasix 120 mg BID, -Patient is having cardiorenal syndrome,  Nephrology is planning short-term dialysis trial. 3/27 :Patient underwent temporary dialysis catheter. Initiation of HD 3/28 -3/28: Patient underwent hemodialysis, feels improved.  Not clear about long-term hemodialysis. -3/29: She reports feeling much improved, weight is down, having HD today again.  Active problems : Acute kidney injury superimposed on CKD stage IV : -Likely worsened due to acute on chronic CHF, baseline creatinine 3.6-3.7 -Presented with creatinine of 4.3, continue diuresis and monitor renal function. -Creatinine worsening 3.47 today.  - Continue to monitor renal function, electrolytes, I's and O's.  3/25: Nephrology consulted,  Seems refractory to diuretics at this time and showing early uremic symptoms,   - Patient is heading towards RRT,  IR consulted for Temporary dialysis catheter. - Initiation of hemodialysis 3/28.  Not clear about long-term hemodialysis.   Paroxysmal atrial fibrillation -Continue Coreg 25 mg twice daily, HR slightly elevated in low 100's, in atrial fibrillation -Not on anticoagulation due to high risk of bleeding.   Essential hypertension -BP stable, continue Coreg, hydralazine, Imdur. -Continue IV Lasix   Thrombocytopenia -Unclear etiology, mild,  no active bleeding, monitor counts.   History of endometrial cancer with chronic abnormal uterine bleeding Anemia due to chronic blood loss and CKD stage IV -Baseline ~9 Currently stable, 10.6- 10.8. -  Plan nonoperative management with the goal to control  bleeding and remain comfort.   UTI: UA consistent with UTI Continue ceftriaxone.  Urine culture grew E. coli resistant to ceftriaxone Antibiotic changed to meropenum ( ESBL)   Obesity Estimated body mass index is 44.17 kg/m as calculated from the following:   Height as of this encounter: 4\' 11"  (1.499 m).   Weight as of this encounter: 99.2 kg.   DVT prophylaxis: SCDs Code Status:DNR Family Communication: No family at bed side. Disposition Plan:   Status is: Inpatient  Remains inpatient appropriate because:Inpatient level of care appropriate due to severity of illness   Dispo: The patient is from: Home              Anticipated d/c is to: SNF               Patient currently is  Not medically stable for dc   Difficult to place patient No  Consultants:    None  Procedures:  Antimicrobials:  Anti-infectives (From admission, onward)   Start     Dose/Rate Route Frequency Ordered Stop   10/06/20 1200  fosfomycin (MONUROL) packet 3 g        3 g Oral  Once 10/06/20 1109     10/03/20 0930  cefTRIAXone (ROCEPHIN) 1 g in sodium chloride 0.9 % 100 mL IVPB  Status:  Discontinued        1 g 200 mL/hr over 30 Minutes Intravenous Every 24 hours 10/03/20 0932 10/06/20 1109      Subjective: Patient was seen and examined at bed side.  Overnight events noted.   She reports feeling better after dialysis, she is hoping that she will continue to tolerate dialysis. She denies any chest pain or shortness of breath. She has Dialysis catheter in Left neck.  Objective: Vitals:   10/06/20 0237 10/06/20 0700 10/06/20 1000 10/06/20 1400  BP:  124/78 (!) 134/95 118/88  Pulse:  60 (!) 52   Resp:  10    Temp:      TempSrc:      SpO2:   99%   Weight: 97.8 kg     Height:        Intake/Output Summary (Last 24 hours) at 10/06/2020 1552 Last data filed at 10/06/2020 1100 Gross per 24 hour  Intake 480 ml  Output 800 ml  Net -320 ml   Filed Weights   10/05/20 0652 10/05/20 0929 10/06/20  0237  Weight: 109.8 kg 107.7 kg 97.8 kg    Examination:  General exam: Appears calm and comfortable , not in any acute distress Respiratory system: Clear to auscultation. Respiratory effort normal. Cardiovascular system: S1 & S2 heard, RRR. No JVD, murmurs, rubs, gallops or clicks. No pedal edema. Gastrointestinal system: Abdomen is nondistended, soft and nontender. No organomegaly or masses felt. Normal bowel sounds heard. Central nervous system: Alert and oriented. No focal neurological deficits. Extremities: Symmetric 5 x 5 power.  Bilateral pedal edema++ Skin: No rashes, lesions or ulcers Psychiatry: Judgement and insight appear normal. Mood & affect appropriate.     Data Reviewed: I have personally reviewed following labs and imaging studies  CBC: Recent Labs  Lab 10/01/20 0232 10/02/20 0305 10/03/20 0111 10/05/20 0157 10/05/20 1221 10/06/20 0252  WBC 3.4* 2.5* 2.9*  --  2.4* 2.9*  HGB 10.4* 10.4* 10.4* 11.0* 10.4* 10.7*  HCT 31.8* 31.8* 33.1* 35.6* 32.6* 33.2*  MCV 88.8 88.8 90.7  --  90.1 89.5  PLT 97* 103* 109*  --  78* 79*   Basic Metabolic Panel: Recent Labs  Lab 10/03/20 0111 10/04/20 0335 10/05/20 0157 10/05/20 1221 10/06/20 0252  NA 143 142 143 141 142  K 4.2 3.6 3.4* 3.7 3.6  CL 109 105 106 104 105  CO2 23 25 26 25 27   GLUCOSE 72 94 135* 103* 116*  BUN 109* 115* 114* 85* 90*  CREATININE 3.98* 4.11* 4.03* 3.26* 3.47*  CALCIUM 8.4* 8.3* 8.3* 8.6* 8.4*  MG 2.2  --   --   --   --   PHOS 5.5*  --   --  4.5  --    GFR: Estimated Creatinine Clearance: 16.4 mL/min (A) (by C-G formula based on SCr of 3.47 mg/dL (H)). Liver Function Tests: Recent Labs  Lab 10/05/20 1221  ALBUMIN 2.7*   No results for input(s): LIPASE, AMYLASE in the last 168 hours. No results for input(s): AMMONIA in the last 168 hours. Coagulation Profile: No results for input(s): INR, PROTIME in the last 168 hours. Cardiac Enzymes: No results for input(s): CKTOTAL, CKMB,  CKMBINDEX, TROPONINI in the last 168 hours. BNP (last 3 results) No results for input(s): PROBNP in the last 8760 hours. HbA1C: No results for input(s): HGBA1C in the last 72 hours. CBG: Recent Labs  Lab 10/02/20 0059 10/02/20 1556 10/03/20 0819  GLUCAP 99 136* 88   Lipid Profile: No results for input(s): CHOL, HDL, LDLCALC, TRIG, CHOLHDL, LDLDIRECT in the last 72 hours. Thyroid Function Tests: No results for input(s): TSH, T4TOTAL, FREET4, T3FREE, THYROIDAB in the last 72 hours. Anemia Panel: No results for input(s): VITAMINB12, FOLATE, FERRITIN, TIBC, IRON, RETICCTPCT in the last 72 hours. Sepsis Labs: No results for input(s): PROCALCITON, LATICACIDVEN in the last 168 hours.  Recent Results (from the past 240 hour(s))  Resp Panel by RT-PCR (Flu A&B, Covid) Nasopharyngeal Swab     Status: None   Collection Time: 10/01/20 11:03 AM   Specimen: Nasopharyngeal Swab; Nasopharyngeal(NP) swabs in vial transport medium  Result Value Ref Range Status   SARS Coronavirus 2 by RT PCR NEGATIVE NEGATIVE Final    Comment: (NOTE) SARS-CoV-2 target nucleic acids are NOT DETECTED.  The SARS-CoV-2 RNA is generally detectable in upper respiratory specimens during the acute phase of infection. The lowest concentration of SARS-CoV-2 viral copies this assay can detect is 138 copies/mL. A negative result does not preclude SARS-Cov-2 infection and should not be used as the sole basis for treatment or other patient management decisions. A negative result may occur with  improper specimen collection/handling, submission of specimen other than nasopharyngeal swab, presence of viral mutation(s) within the areas targeted by this assay, and inadequate number of viral copies(<138 copies/mL). A negative result must be combined with clinical observations, patient history, and epidemiological information. The expected result is Negative.  Fact Sheet for Patients:   EntrepreneurPulse.com.au  Fact Sheet for Healthcare Providers:  IncredibleEmployment.be  This test is no t yet approved or cleared by the [place] FDA and  has been authorized for detection and/or diagnosis of SARS-CoV-2 by FDA under an Emergency Use Authorization (EUA). This EUA will remain  in effect (meaning this test can be used) for the duration of the COVID-19 declaration under Section 564(b)(1) of the Act, 21 U.S.C.section 360bbb-3(b)(1), unless the authorization is terminated  or revoked sooner.       Influenza A by PCR NEGATIVE NEGATIVE Final   Influenza B by PCR NEGATIVE NEGATIVE Final    Comment: (NOTE) The Xpert Xpress SARS-CoV-2/FLU/RSV plus assay is intended as an  aid in the diagnosis of influenza from Nasopharyngeal swab specimens and should not be used as a sole basis for treatment. Nasal washings and aspirates are unacceptable for Xpert Xpress SARS-CoV-2/FLU/RSV testing.  Fact Sheet for Patients: EntrepreneurPulse.com.au  Fact Sheet for Healthcare Providers: IncredibleEmployment.be  This test is not yet approved or cleared by the [place] FDA and has been authorized for detection and/or diagnosis of SARS-CoV-2 by FDA under an Emergency Use Authorization (EUA). This EUA will remain in effect (meaning this test can be used) for the duration of the COVID-19 declaration under Section 564(b)(1) of the Act, 21 U.S.C. section 360bbb-3(b)(1), unless the authorization is terminated or revoked.  Performed at [facility], [place] [address] [place], [place] [address]   Culture, Urine     Status: Abnormal   Collection Time: 10/03/20  6:00 PM   Specimen: Urine, Random  Result Value Ref Range Status   Specimen Description URINE, RANDOM  Final   Special Requests   Final    NONE Performed at [facility], [place] [address], [place], [place] [address]    Culture (A)  Final     70,000 COLONIES/mL ESCHERICHIA COLI Confirmed Extended Spectrum Beta-Lactamase Producer (ESBL).  In bloodstream infections from ESBL organisms, carbapenems are preferred over piperacillin/tazobactam. They are shown to have a lower risk of mortality.    Report Status 10/06/2020 FINAL  Final   Organism ID, Bacteria ESCHERICHIA COLI (A)  Final      Susceptibility   Escherichia coli - MIC*    AMPICILLIN >=32 RESISTANT Resistant     CEFAZOLIN >=64 RESISTANT Resistant     CEFEPIME 0.5 SENSITIVE Sensitive     CEFTRIAXONE 32 RESISTANT Resistant     CIPROFLOXACIN 1 SENSITIVE Sensitive     GENTAMICIN >=16 RESISTANT Resistant     IMIPENEM <=0.25 SENSITIVE Sensitive     NITROFURANTOIN 64 INTERMEDIATE Intermediate     TRIMETH/SULFA <=20 SENSITIVE Sensitive     AMPICILLIN/SULBACTAM 8 SENSITIVE Sensitive     PIP/TAZO <=4 SENSITIVE Sensitive     * 70,000 COLONIES/mL ESCHERICHIA COLI     Radiology Studies: DG Chest 1 View  Result Date: 10/05/2020 CLINICAL DATA:  Chest pain and CHF EXAM: CHEST  1 VIEW COMPARISON:  09/24/2020 FINDINGS: Left sided dialysis catheter tip at mild right atrium. Patient rotated left. Mild cardiomegaly. Atherosclerosis in the transverse aorta. Layering right pleural effusion is small and similar. No pneumothorax. Mild pulmonary venous congestion is new, accentuated by AP portable technique. Right greater than left base airspace disease is not significantly changed. IMPRESSION: Cardiomegaly with development of mild pulmonary venous congestion. Otherwise, similar right pleural effusion with bibasilar Airspace disease, likely atelectasis. Left-sided dialysis catheter at mid right atrium, without pneumothorax. Aortic Atherosclerosis (ICD10-I70.0). Electronically Signed   By: [name] Miyamoto M.D.   On: 10/05/2020 12:17    Scheduled Meds: . carvedilol  25 mg Oral BID WC  . Chlorhexidine Gluconate Cloth  6 each Topical Q0600  . ferrous sulfate  325 mg Oral BID WC  . fosfomycin  3 g Oral  Once  . hydrALAZINE  50 mg Oral Q8H  . hydrocortisone cream   Topical BID  . isosorbide mononitrate  30 mg Oral Daily  . nutrition supplement (JUVEN)  1 packet Oral BID BM  . potassium chloride SA  10 mEq Oral BID  . sodium chloride flush  3 mL Intravenous Q12H   Continuous Infusions: . sodium chloride    . sodium chloride    .  furosemide 120 mg (10/06/20 0824)     LOS: 11 days    Time spent: 25 mins    Kaipo Ardis, MD [facility]   If 7PM-7AM, please contact night-coverage

## 2020-10-06 NOTE — TOC Progression Note (Signed)
Transition of Care [facility]) - Progression Note    Patient Details  Name: Vanessa [name] MRN: [id number] Date of Birth: [birth year]  Transition of [facility]) CM/SW [place], [place] Phone Number: 10/06/2020, 11:25 AM  Clinical Narrative:      CSW received consult for palliative to follow patient at SNF. CSW called [facility] and made referral for patient. CSW spoke with Chrislynn with [facility]. CSW will continue to follow and assist with discharge planning needs.     Expected Discharge Plan: Skilled Nursing Facility Barriers to Discharge: Continued Medical Work up  Expected Discharge Plan and Services Expected Discharge Plan: [place] In-house Referral: Clinical Social Work     Living arrangements for the past 2 months: [place]                                       Social Determinants of Health (SDOH) Interventions    Readmission Risk Interventions Readmission Risk Prevention Plan 11/08/2019 10/21/2019 09/25/2019  Transportation Screening Complete Complete Complete  PCP or Specialist Appt within 3-5 Days - - Not Complete  Not Complete comments - - plan for SNF  [place] or [place] - - Complete  Social Work Consult for [place] Planning/Counseling - - Complete  Palliative Care Screening - - Not Applicable  Medication Review Press photographer) Complete Referral to Pharmacy Referral to Pharmacy  PCP or Specialist appointment within 3-5 days of discharge Complete Complete -  [place] or Home Care Consult Complete (No Data) -  SW Recovery Care/Counseling Consult Complete Complete -  Palliative Care Screening Not Applicable Not Applicable -  Skilled Nursing Facility Complete Complete -  Some recent data might be hidden

## 2020-10-06 NOTE — Progress Notes (Signed)
F/u scheduled Tuesday Oct 20, 2020 3:15 PM, appt info on AVS.

## 2020-10-06 NOTE — Plan of Care (Signed)

## 2020-10-06 NOTE — Progress Notes (Signed)
Pharmacy Antibiotic Note  Vanessa [name] is a 65 y.o. female admitted on 09/24/2020 with worsening SOB, acute on chronic combined systolic/diastolic HF and AKI on CKD stage IV. Pt started trial of HD yesterday, 3/28, with plans for HD #2 today; IR has been consulted for temporary HD catheter. Pt also with UTI, for which she is being treated with ceftriaxone. Pharmacy has been consulted for meropenem dosing for ESBL E coli UTI.  WBC 2.9, afebrile  Plan: Meropenem 500 mg IV Q 24 hrs (give dose daily; on HD days, give dose on nursing unit after pt returns from HD) Monitor WBC, temp, clinical improvement, HD schedule  Height: 4\' 11"  (149.9 cm) Weight: 97.8 kg (215 lb 9.6 oz) IBW/kg (Calculated) : 43.2  No data recorded.  Recent Labs  Lab 10/01/20 0232 10/02/20 0305 10/03/20 0111 10/04/20 0335 10/05/20 0157 10/05/20 1221 10/06/20 0252  WBC 3.4* 2.5* 2.9*  --   --  2.4* 2.9*  CREATININE 3.81* 3.98* 3.98* 4.11* 4.03* 3.26* 3.47*    Estimated Creatinine Clearance: 16.4 mL/min (A) (by C-G formula based on SCr of 3.47 mg/dL (H)).    Allergies  Allergen Reactions  . Ace Inhibitors Other (See Comments)    Never prescribed but she has had angioedema  . Angiotensin Receptor Blockers Other (See Comments)    ARB never Rxed but PMH  of angioedema in March 2021    Antimicrobials this admission: Ceftriaxone: 3/26 > 3/29 Meropenem: 3/29 >  Microbiology results: 3/26 UCx: 70,000 colonies/ml E coli, ESBL producer 3/19: MRSA PCR: positive 3/28: HBV panel:  negative  Thank you for allowing pharmacy to be a part of this patient's care.  Gillermina Hu, PharmD, BCPS, [facility] Clinical Pharmacist 10/06/2020 4:48 PM

## 2020-10-06 NOTE — Progress Notes (Addendum)
Progress Note  Patient Name: Vanessa [name] Date of Encounter: 10/06/2020  Primary Cardiologist: Ena Dawley, MD  Subjective   Feeling much better today. Tolerated HD well. No CP or SOB. Edema persists.  Inpatient Medications    Scheduled Meds: . carvedilol  25 mg Oral BID WC  . Chlorhexidine Gluconate Cloth  6 each Topical Q0600  . ferrous sulfate  325 mg Oral BID WC  . hydrALAZINE  50 mg Oral Q8H  . hydrocortisone cream   Topical BID  . isosorbide mononitrate  30 mg Oral Daily  . nutrition supplement (JUVEN)  1 packet Oral BID BM  . potassium chloride SA  10 mEq Oral BID  . sodium chloride flush  3 mL Intravenous Q12H   Continuous Infusions: . sodium chloride    . sodium chloride    . cefTRIAXone (ROCEPHIN)  IV 1 g (10/05/20 1237)  . furosemide 120 mg (10/06/20 0824)   PRN Meds: sodium chloride, sodium chloride, acetaminophen **OR** acetaminophen, alteplase, camphor-menthol, heparin, hydrOXYzine, lidocaine (PF), lidocaine-prilocaine, ondansetron **OR** ondansetron (ZOFRAN) IV, pentafluoroprop-tetrafluoroeth, phenylephrine, polyethylene glycol   Vital Signs    Vitals:   10/05/20 2152 10/06/20 0013 10/06/20 0237 10/06/20 0700  BP: (!) 119/99 (!) 128/96  124/78  Pulse: 99   60  Resp: 16 15  10   Temp:      TempSrc:      SpO2:      Weight:   97.8 kg   Height:        Intake/Output Summary (Last 24 hours) at 10/06/2020 0857 Last data filed at 10/06/2020 0700 Gross per 24 hour  Intake 243 ml  Output 2602 ml  Net -2359 ml   Last 3 Weights 10/06/2020 10/05/2020 10/05/2020  Weight (lbs) 215 lb 9.6 oz 237 lb 7 oz 242 lb 2.7 oz  Weight (kg) 97.796 kg 107.7 kg 109.848 kg     Telemetry    Predominantly sinus bradycardia 50s, one PVC - Personally Reviewed  Physical Exam   GEN: No acute distress.  HEENT: Normocephalic, atraumatic, sclera non-icteric. Chronically poor dentition Neck: No JVD or bruits. Cardiac: Reg rhythm, HR 50s, no murmurs, rubs, or gallops.   Respiratory: Clear to auscultation bilaterally. Breathing is unlabored. GI: Soft, nontender, non-distended, BS +x 4. MS: no deformity. Extremities: No clubbing or cyanosis. Marked BLE edema but feels somewhat softer today. Neuro:  AAOx3. Follows commands. Psych:  Responds to questions appropriately with a normal affect. Cheerful today.  Labs    High Sensitivity Troponin:   Recent Labs  Lab 09/24/20 1447 09/24/20 1900  TROPONINIHS 36* 37*      Cardiac EnzymesNo results for input(s): TROPONINI in the last 168 hours. No results for input(s): TROPIPOC in the last 168 hours.   Chemistry Recent Labs  Lab 10/05/20 0157 10/05/20 1221 10/06/20 0252  NA 143 141 142  K 3.4* 3.7 3.6  CL 106 104 105  CO2 26 25 27   GLUCOSE 135* 103* 116*  BUN 114* 85* 90*  CREATININE 4.03* 3.26* 3.47*  CALCIUM 8.3* 8.6* 8.4*  ALBUMIN  --  2.7*  --   GFRNONAA 12* 15* 14*  ANIONGAP 11 12 10      Hematology Recent Labs  Lab 10/03/20 0111 10/05/20 0157 10/05/20 1221 10/06/20 0252  WBC 2.9*  --  2.4* 2.9*  RBC 3.65*  --  3.62* 3.71*  HGB 10.4* 11.0* 10.4* 10.7*  HCT 33.1* 35.6* 32.6* 33.2*  MCV 90.7  --  90.1 89.5  MCH 28.5  --  28.7 28.8  MCHC 31.4  --  31.9 32.2  RDW 17.9*  --  17.8* 18.2*  PLT 109*  --  78* 79*    BNP Recent Labs  Lab 10/02/20 0305  BNP 1,216.5*     DDimer No results for input(s): DDIMER in the last 168 hours.   Radiology    DG Chest 1 View  Result Date: 10/05/2020 CLINICAL DATA:  Chest pain and CHF EXAM: CHEST  1 VIEW COMPARISON:  09/24/2020 FINDINGS: Left sided dialysis catheter tip at mild right atrium. Patient rotated left. Mild cardiomegaly. Atherosclerosis in the transverse aorta. Layering right pleural effusion is small and similar. No pneumothorax. Mild pulmonary venous congestion is new, accentuated by AP portable technique. Right greater than left base airspace disease is not significantly changed. IMPRESSION: Cardiomegaly with development of mild  pulmonary venous congestion. Otherwise, similar right pleural effusion with bibasilar Airspace disease, likely atelectasis. Left-sided dialysis catheter at mid right atrium, without pneumothorax. Aortic Atherosclerosis (ICD10-I70.0). Electronically Signed   By: [name] Miyamoto M.D.   On: 10/05/2020 12:17   IR Fluoro Guide CV Line Left  Result Date: 10/04/2020 INDICATION: 65 year old with acute on chronic kidney disease. EXAM: FLUOROSCOPIC AND ULTRASOUND GUIDED PLACEMENT OF A NON-TUNNELED DIALYSIS CATHETER Physician: Stephan Minister. Henn, MD MEDICATIONS: None ANESTHESIA/SEDATION: None FLUOROSCOPY TIME:  Fluoroscopy Time: 1 minutes, 42 seconds, 13 mGy COMPLICATIONS: None immediate. PROCEDURE: The procedure was explained to the patient. The risks and benefits of the procedure were discussed and the patient's questions were addressed. Informed consent was obtained from the patient. The patient was placed supine on the interventional table. Ultrasound confirmed a patent right internal jugular vein. Ultrasound images were obtained for documentation. The right neck was prepped and draped in a sterile fashion. The right neck was anesthetized with 1% lidocaine. Maximal barrier sterile technique was utilized including caps, mask, sterile gowns, sterile gloves, sterile drape, hand hygiene and skin antiseptic. A small incision was made with #11 blade scalpel. A 21 gauge needle directed into the right internal jugular vein with ultrasound guidance. A micropuncture dilator set was placed. Wire would not advance centrally. Micropuncture dilator set was removed and a bandage was placed at the puncture site. Ultrasound confirmed a patent left internal jugular vein. Ultrasound image was saved for documentation. Left side of the neck was prepped and draped in sterile fashion. Maximal barrier sterile technique was utilized including caps, mask, sterile gowns, sterile gloves, sterile drape, hand hygiene and skin antiseptic. Skin was  anesthetized using 1% lidocaine. Small incision was made. Using ultrasound guidance, 21 gauge needle was directed into the left internal jugular vein and wire easily advanced centrally. Micropuncture dilator set was placed. J wire was placed. Tract was dilated. A 20 cm Mahurkar catheter was selected. The catheter was advanced over a wire and positioned at the superior cavoatrial junction. Fluoroscopic images were obtained for documentation. Both dialysis lumens were found to aspirate and flush well. The proper amount of heparin was flushed in both lumens. The central venous lumen was flushed with normal saline. Catheter was sutured to skin. FINDINGS: 1. Right internal jugular vein is patent but there is peripheral wall thickening suggesting old thrombus in this area. Wire would not advance centrally from the right internal jugular vein and not clear if there is a central occlusion or possibly septations from old thrombus. 2. Left internal jugular vein is widely patent. 3. Catheter tip is near the superior cavoatrial junction. IMPRESSION: Successful placement of a left jugular non-tunneled dialysis catheter using ultrasound  and fluoroscopic guidance. Electronically Signed   By: Markus Daft M.D.   On: 10/04/2020 13:44   IR US Guide Vasc Access Left  Result Date: 10/04/2020 INDICATION: 65 year old with acute on chronic kidney disease. EXAM: FLUOROSCOPIC AND ULTRASOUND GUIDED PLACEMENT OF A NON-TUNNELED DIALYSIS CATHETER Physician: Stephan Minister. Henn, MD MEDICATIONS: None ANESTHESIA/SEDATION: None FLUOROSCOPY TIME:  Fluoroscopy Time: 1 minutes, 42 seconds, 13 mGy COMPLICATIONS: None immediate. PROCEDURE: The procedure was explained to the patient. The risks and benefits of the procedure were discussed and the patient's questions were addressed. Informed consent was obtained from the patient. The patient was placed supine on the interventional table. Ultrasound confirmed a patent right internal jugular vein. Ultrasound  images were obtained for documentation. The right neck was prepped and draped in a sterile fashion. The right neck was anesthetized with 1% lidocaine. Maximal barrier sterile technique was utilized including caps, mask, sterile gowns, sterile gloves, sterile drape, hand hygiene and skin antiseptic. A small incision was made with #11 blade scalpel. A 21 gauge needle directed into the right internal jugular vein with ultrasound guidance. A micropuncture dilator set was placed. Wire would not advance centrally. Micropuncture dilator set was removed and a bandage was placed at the puncture site. Ultrasound confirmed a patent left internal jugular vein. Ultrasound image was saved for documentation. Left side of the neck was prepped and draped in sterile fashion. Maximal barrier sterile technique was utilized including caps, mask, sterile gowns, sterile gloves, sterile drape, hand hygiene and skin antiseptic. Skin was anesthetized using 1% lidocaine. Small incision was made. Using ultrasound guidance, 21 gauge needle was directed into the left internal jugular vein and wire easily advanced centrally. Micropuncture dilator set was placed. J wire was placed. Tract was dilated. A 20 cm Mahurkar catheter was selected. The catheter was advanced over a wire and positioned at the superior cavoatrial junction. Fluoroscopic images were obtained for documentation. Both dialysis lumens were found to aspirate and flush well. The proper amount of heparin was flushed in both lumens. The central venous lumen was flushed with normal saline. Catheter was sutured to skin. FINDINGS: 1. Right internal jugular vein is patent but there is peripheral wall thickening suggesting old thrombus in this area. Wire would not advance centrally from the right internal jugular vein and not clear if there is a central occlusion or possibly septations from old thrombus. 2. Left internal jugular vein is widely patent. 3. Catheter tip is near the superior  cavoatrial junction. IMPRESSION: Successful placement of a left jugular non-tunneled dialysis catheter using ultrasound and fluoroscopic guidance. Electronically Signed   By: Markus Daft M.D.   On: 10/04/2020 13:44    Cardiac Studies   2D echo 08/08/20 1. Left ventricular ejection fraction, by estimation, is 35 to 40%. The  left ventricle has moderately decreased function. The left ventricle  demonstrates global hypokinesis. There is mild concentric left ventricular  hypertrophy. Indeterminate diastolic  filling due to E-A fusion. Elevated left atrial pressure.  2. Right ventricular systolic function is moderately reduced. Mildly  increased right ventricular wall thickness. There is moderately elevated  pulmonary artery systolic pressure. The estimated right ventricular  systolic pressure is 93.7 mmHg.  3. Left atrial size was mildly dilated.  4. The mitral valve is normal in structure. Trivial mitral valve  regurgitation.  5. Tricuspid valve regurgitation is moderate.  6. The aortic valve is normal in structure. Aortic valve regurgitation is  mild. No aortic stenosis is present.  7. The inferior vena cava is  dilated in size with <50% respiratory  variability, suggesting right atrial pressure of 15 mmHg.   Comparison(s): Prior images reviewed side by side. The left ventricular  function is significantly worse.   Patient Profile     65 y.o. female with chronic combined CHF with LVEF at 35-40% (new dx 07/2020), recent dx of atrial flutter not on anticoagulation due toendometrial cancerwith bleedingdx 2021, MSSA bacteremia, HTN, HLD,esophageal immobility, DM2, CKD StageIV/V, asthma, esophageal immobility, sacral ulcer, severe morbid obesity, andanemia of chronic disease. Seen by cardiology last admission - she was not felt to be a candidate for advanced therapies like milrinone and ischemic workup not pursued. She was not a candidate for rhythm strategy of arrhythmia due to her  endometrial CA, bleeding and anemia and inability to use anticoagulation. She was discharged to SNF with involvement of palliative care. She presented back to the ED 09/24/20 with recurrent CHF symptoms and was started on diuresis with worsening renal insufficiency and poor response despite IV Lasix/metolazone. Nephrology following and planned trial of dialysis given early uremic symptoms.  Assessment & Plan    1. Acute on chronic combined CHF - EF notably down last admission to 35-40%, no ischemic evaluation pursued suspect due to comorbidities, inability to use blood thinners even if obstruction found due to anemia/bleeding - failed to respond to diuretics so started trial of HD 10/05/20 (tolerated well) - on carvedilol, hydralazine, Imdur  - avoid ACEi/ARB/Spiro/ARNI given renal failure - per MD, not felt to benefit from inotropes and she is not a good long term candidate for advanced HF therapies  2. AKI on CKD stage IV-IV - nephrology is starting trial of HD, not yet clear on long term plan - management of hypokalemia per renal team  3. H/o atrial flutter, with paroxysmal atrial tachycardia this admission - as above, not previously felt a candidate for Catherine 2/2 bleeding/anemia - intermittent SB 50s and atrial tach noted on telemetry - continue carvedilol at present dose - no current signs of low output as BP is preserved and she is warm  4. Essential HTN - managed in context of the above  5. Mildly elevated troponin - hsTroponin 36-37 due to demand ischemia in setting of HF/renal failure - no chest pain  6. Other medical issues - endometrial cancer, anemia, UTI - per notes, nonoperative with goal to control bleeding and remain comfort - has been followed ongoing by palliative care team to reassess goals - mildly elevated TSH, anemia/thrombocytopenia noted - per medical team  For questions or updates, please contact [facility] Please consult [email or web address] for contact info  under Cardiology/STEMI.  Signed, Charlie Pitter, PA-C 10/06/2020, 8:57 AM    The patient was seen, examined and discussed with Melina Copa, PA-C and I agree with the above.   The patient is feeling significantly better after hemodialysis yesterday, she is awaiting another 1 today, she is less fluid overloaded but still needs fluid removal, her volume is now being managed by nephrology, we will sign off, her vitals are stable, will arrange for follow-up in 2 to 3 weeks.  Ena Dawley, MD 10/06/2020

## 2020-10-06 NOTE — Progress Notes (Signed)
   10/06/20 1338  Mobility  Activity Refused mobility (Attempted to wake pt x3)  Mobility Response RN notified  $Mobility charge 1 Mobility

## 2020-10-06 NOTE — Progress Notes (Signed)
Daily Progress Note   Vanessa [name] Name: Vanessa [name]       Date: 10/06/2020 DOB: October 21, 1955  Age: 65 y.o. MRN#: [id number] Attending Physician: Shawna Clamp, MD Primary Care Physician: Vanessa [name], No Pcp Per Admit Date: 09/24/2020  Reason for Consultation/Follow-up: Establishing goals of care  Subjective: Vanessa [name] awake, alert, oriented. Reports feeling better today following first round of hemodialysis. She can tell a difference. No complaints during visit.   GOC:  F/u GOC with Vanessa [name] at bedside. Vanessa [name] known to this NP from previous admission.   Vanessa [name] is in good spirits this morning. She feels better and denies complaints of pain or discomfort this morning.  Reviewed events leading up to admission and course of hospitalization including diagnoses, interventions, plan of care. Discussed multiple unfixable problems and unfortunately her poor response to aggressive diuresis, leading to the decision for trial of dialysis. Discussed concern with her ability to tolerate long-term dialysis with weak heart and endometrial cancer.  Explored patients thoughts regarding her condition and her current goals. Vanessa [name] speaks of her hope to prolong life and ultimately wishes to go back to work. Explained fear that it will be challenging for her to work again with multiple chronic and progressive issues. She seems somewhat surprised by this. She speaks of wish to continue with the dialysis trial. "See how I do with it" and "play it by ear." She is hopeful to "be around" longer. She again shares that she feels better from dialysis yesterday and was encouraged by the fact that her weight is down this morning. Discussed watchful waiting, time for outcomes, ongoing palliative discussions. Discussed quality of  life aspects of care.   Explored what helps her cope with declining health and independence. She has strong [place] faith and "I pray and ask God to help me." She is interested in further visits from chaplain while admitted.   Vanessa [name] does confirm her decision for DNR code status if things took a turn for the worst.   Vanessa [name] appreciative of visit. Reassured of ongoing intermittent palliative support this admission.    Length of Stay: 11  Current Medications: Scheduled Meds:  . carvedilol  25 mg Oral BID WC  . Chlorhexidine Gluconate Cloth  6 each Topical Q0600  . ferrous sulfate  325 mg Oral BID WC  . hydrALAZINE  50 mg Oral Q8H  . hydrocortisone cream   Topical BID  . isosorbide mononitrate  30 mg Oral Daily  . nutrition supplement (JUVEN)  1 packet Oral BID BM  . potassium chloride SA  10 mEq Oral BID  . sodium chloride flush  3 mL Intravenous Q12H    Continuous Infusions: . sodium chloride    . sodium chloride    . cefTRIAXone (ROCEPHIN)  IV 1 g (10/05/20 1237)  . furosemide 120 mg (10/06/20 0824)    PRN Meds: sodium chloride, sodium chloride, acetaminophen **OR** acetaminophen, alteplase, camphor-menthol, heparin, hydrOXYzine, lidocaine (PF), lidocaine-prilocaine, ondansetron **OR** ondansetron (ZOFRAN) IV, pentafluoroprop-tetrafluoroeth, phenylephrine, polyethylene glycol  Physical Exam Vitals and nursing note reviewed.  Constitutional:      General: She is awake.  HENT:     Head: Normocephalic and atraumatic.  Cardiovascular:     Rate and Rhythm: Rhythm irregularly irregular.  Pulmonary:     Effort: No tachypnea, accessory muscle usage or respiratory distress.  Skin:    General: Skin is warm and dry.  Neurological:     Mental Status: She is alert and oriented to person, place, and time.  Psychiatric:        Mood and Affect: Mood normal.        Speech: Speech normal.        Behavior: Behavior normal.        Cognition and Memory: Cognition normal.             Vital Signs: BP 124/78 (BP Location: Left Arm)   Pulse 60   Temp 97.6 F (36.4 C) (Oral)   Resp 10   Ht 4\' 11"  (1.499 m)   Wt 97.8 kg   LMP 03/17/2015   SpO2 96%   BMI 43.55 kg/m  SpO2: SpO2: 96 % O2 Device: O2 Device: Room Air O2 Flow Rate:    Intake/output summary:   Intake/Output Summary (Last 24 hours) at 10/06/2020 1032 Last data filed at 10/06/2020 0700 Gross per 24 hour  Intake 243 ml  Output 602 ml  Net -359 ml   LBM: Last BM Date: 10/04/20 Baseline Weight: Weight: 102.2 kg Most recent weight: Weight: 97.8 kg       Palliative Assessment/Data:PPS 50%      Vanessa [name] Active Problem List   Diagnosis Date Noted  . Acute right-sided heart failure ([place])   . Acute on chronic combined systolic (congestive) and diastolic (congestive) heart failure ([place]) 09/24/2020  . Acute respiratory failure with hypoxia ([place]) 08/08/2020  . Acute diastolic CHF (congestive heart failure) ([place]) 08/08/2020  . Elevated troponin 08/08/2020  . Abnormal uterine bleeding due to primary malignant neoplasm of endometrium ([place]) 05/24/2020  . Cellulitis 04/23/2020  . Bilateral lower leg cellulitis 04/22/2020  . Neurocognitive deficits 12/11/2019  . Edema 12/04/2019  . Acute kidney injury superimposed on CKD ([place]) 12/03/2019  . Hypothermia 11/26/2019  . CKD (chronic kidney disease) stage 3, GFR 30-59 ml/min (H[facility])   . Hypernatremia   . COVID-19 virus infection   . Generalized weakness   . Hypertensive urgency   . Allergic angioedema 10/19/2019  . Anasarca 10/18/2019  . Angioedema 10/18/2019  . Hypertensive emergency 10/18/2019  . Trichimoniasis 10/17/2019  . Macrocytic anemia 10/05/2019  . Esophageal dysmotility 10/04/2019  . Pressure injury of skin 09/22/2019  . Endometrial cancer ([place]) 09/21/2019  . Left humeral fracture 09/21/2019  . Iron deficiency anemia due to chronic blood loss 09/21/2019  . MSSA bacteremia 09/18/2019  . Postmenopausal bleeding 09/15/2019  . Thrombocytopenia  (  [place]) 09/15/2019  . Generalized pruritus 09/15/2019  . Benign hypertension with CKD (chronic kidney disease) stage III ([place]) 09/15/2019  . Hyperuricemia 09/15/2019  . Hypokalemia 09/15/2019  . Symptomatic anemia 09/14/2019  . Non compliance w medication regimen 03/24/2015  . Dyslipidemia 03/24/2015  . DM type 2 (diabetes mellitus, type 2) ([place]) 05/23/2014  . HTN (hypertension) 05/23/2014    Palliative Care Assessment & Plan   Vanessa [name] Profile:  HPI: Per Progress Note by Dr. 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  Vanessa [name] recently admitted from 08/07/2020-08/18/2020 for acute on chronic combined systolic and diastolic CHF exacerbation, AKI on CKD stage IV, and new onset A. fib with RVR. Vanessa [name] was managed with IV diuresis before transition to oral torsemide. A. fib/flutter was controlled with Coreg. Anticoagulation not started due to high risk of bleeding. Vanessa [name] was ultimately discharged to SNF with palliative care. Vanessa [name] is admitted for acute on chronic CHF exacerbation, started on diuresis.Cardiology and nephrology has been consulted.  3/27 Vanessa [name] underwent temporary dialysis catheter placement. Tolerated first session of HD on 3/28.  Assessment: Acute on chronic combined systolic and diastolic heart failure AKI superimposed on CKD stage IV, cardiorenal syndrome Paroxysmal atrial fibrillation, not on anticoagulation Endometrial cancer with chronic abnormal uterine bleeding Anemia due to chronic blood loss and CKD Essential  hypertension Thrombocytopenia UTI Obesity  Recommendations/Plan:  Vanessa [name] confirms DNR code status during visit.  Vanessa [name] reports feeling better following first hemodialysis session and is encouraged with the fact her weight is down. She is hopeful to continue tolerating dialysis in order to prolong life. She is taking this one day at a time and understands need for ongoing [place] discussions and her eligibility and/or ability to tolerate long-term dialysis if offered.  Continue current plan of care and medical management. Watchful waiting. Time for outcomes.  Ongoing intermittent palliative support this admission pending clinical condition.   Code Status: DNR   Code Status Orders  (From admission, onward)         Start     Ordered   09/24/20 2239  Do not attempt resuscitation (DNR)  Continuous       Question Answer Comment  In the event of cardiac or respiratory ARREST Do not call a "code blue"   In the event of cardiac or respiratory ARREST Do not perform Intubation, CPR, defibrillation or ACLS   In the event of cardiac or respiratory ARREST Use medication by any route, position, wound care, and other measures to relive pain and suffering. May use oxygen, suction and manual treatment of airway obstruction as needed for comfort.      09/24/20 2239        Code Status History    Date Active Date Inactive Code Status Order ID Comments User Context   08/07/2020 2307 08/18/2020 1841 Full Code [id number]  Kayleen Memos, DO Inpatient   05/24/2020 1549 06/09/2020 2229 Full Code [id number]  Vashti Hey, MD ED   04/22/2020 0433 04/30/2020 1940 Full Code [id number]  Etta Quill, DO ED   11/06/2019 1511 11/22/2019 1708 Full Code [id number]  Mckinley Jewel, MD ED   10/18/2019  [place] 10/22/2019 0111 Full Code [id number]  Little Ishikawa, MD ED   10/04/2019 2133 10/10/2019 2231 Full Code [id number]  Vianne Bulls, MD ED   09/14/2019 2053 09/26/2019 0059 Full Code [id number]  Bennie Pierini, MD Inpatient   Advance Care Planning Activity    Advance Directive Documentation   Flowsheet Row Most Recent Value  Type of Advance Directive Living will  Pre-existing out of facility DNR order (yellow form or pink MOST form) --  "MOST" Form in Place? --       Prognosis:   Guarded long-term  Discharge Planning:  To Be Determined  Care plan was discussed with Vanessa [name]  Thank you for allowing the Palliative Medicine Team to assist in the care of this Vanessa [name].   Total Time 35 Prolonged Time Billed no      Greater than 50%  of this time was spent counseling and coordinating care related to the above assessment and plan.  Ihor Dow, DNP, FNP-C Palliative Medicine Team  Phone: [phone] Fax: [phone]  Please contact Palliative Medicine Team phone at [phone] for questions and concerns.

## 2020-10-07 DIAGNOSIS — I5043 Acute on chronic combined systolic (congestive) and diastolic (congestive) heart failure: Secondary | ICD-10-CM | POA: Diagnosis not present

## 2020-10-07 LAB — BASIC METABOLIC PANEL
Anion gap: 10 (ref 5–15)
BUN: 58 mg/dL — ABNORMAL HIGH (ref 8–23)
CO2: 29 mmol/L (ref 22–32)
Calcium: 8.4 mg/dL — ABNORMAL LOW (ref 8.9–10.3)
Chloride: 101 mmol/L (ref 98–111)
Creatinine, Ser: 2.56 mg/dL — ABNORMAL HIGH (ref 0.44–1.00)
GFR, Estimated: 20 mL/min — ABNORMAL LOW (ref >60)
Glucose, Bld: 88 mg/dL (ref 70–99)
Potassium: 3.5 mmol/L (ref 3.5–5.1)
Sodium: 140 mmol/L (ref 135–145)

## 2020-10-07 LAB — MAGNESIUM: Magnesium: 2 mg/dL (ref 1.7–2.4)

## 2020-10-07 LAB — CBC
HCT: 31 % — ABNORMAL LOW (ref 36.0–46.0)
Hemoglobin: 10.1 g/dL — ABNORMAL LOW (ref 12.0–15.0)
MCH: 28.9 pg (ref 26.0–34.0)
MCHC: 32.6 g/dL (ref 30.0–36.0)
MCV: 88.6 fL (ref 80.0–100.0)
Platelets: 61 10*3/uL — ABNORMAL LOW (ref 150–400)
RBC: 3.5 MIL/uL — ABNORMAL LOW (ref 3.87–5.11)
RDW: 18 % — ABNORMAL HIGH (ref 11.5–15.5)
WBC: 3.2 10*3/uL — ABNORMAL LOW (ref 4.0–10.5)
nRBC: 4 % — ABNORMAL HIGH (ref 0.0–0.2)

## 2020-10-07 LAB — PHOSPHORUS: Phosphorus: 3.7 mg/dL (ref 2.5–4.6)

## 2020-10-07 MED ORDER — METOLAZONE 5 MG PO TABS
5.0000 mg | ORAL_TABLET | Freq: Once | ORAL | Status: AC
  Administered 2020-10-07: 5 mg via ORAL
  Filled 2020-10-07: qty 1

## 2020-10-07 NOTE — Progress Notes (Signed)
[facility] Liaison note:  This patient is currently enrolled with [facility] outpatient-based Palliative Care services. Will continue to follow for disposition.  Please call with any outpatient palliative questions or concerns.  Thank you, Lorelee Market, LPN [facility] Liaison [phone]

## 2020-10-07 NOTE — Progress Notes (Signed)
PROGRESS NOTE    Camille Dragan  LFY:[id number] DOB: 07-Oct-1955 DOA: 09/24/2020 PCP: Patient, No Pcp Per (Inactive)   Brief Narrative: This 65 years old female with PMH significant for chronic combined systolic and diastolic CHF(EF 35 to 85% by TTE 08/08/20), atrial fibrillation not on anticoagulation due to chronic bleeding from endometrial cancer, CKD stage IV, type II DM, hypertension, anemia of chronic disease, history of MSSA bacteremia, stage I endometrial cancer status post palliative radiation presented to the ED from SNF with shortness of breath and peripheral edema. Patient recently admitted from 08/07/2020-08/18/2020 for acute on chronic combined systolic and diastolic CHF exacerbation, AKI on CKD stage IV, and new onset A. fib with RVR.  Patient was managed with IV diuresis before transition to oral torsemide. A. fib/flutter was controlled with Coreg. Anticoagulation not started due to high risk of bleeding.  Patient was ultimately discharged to SNF with palliative care. Patient is admitted for acute on chronic CHF exacerbation,  started on diuresis.  Cardiology and nephrology has been consulted.  Patient continued to remain fluid overloaded.  Patient was started on metolazone, Patient is having cardiorenal syndrome,  Nephrology is planning short-term dialysis trial.  Assessment & Plan:   Principal Problem:   Acute on chronic combined systolic (congestive) and diastolic (congestive) heart failure (HCC) Active Problems:   HTN (hypertension)   Thrombocytopenia (HCC)   Acute kidney injury superimposed on CKD ([place])   Acute right-sided heart failure (HCC)   Acute on chronic combined systolic and diastolic heart failure ([place]) -She presented with progressive peripheral edema, orthopnea, DOE, BNP 1724. -2D echo 07/2020 had shown EF of 35 to 40%, -Continued IV Lasix 40 mg twice daily, received Lasix 60 mg IV x1 in ED -Continue strict I's and O's and daily weights -Continue Coreg, not on  ACE/ARB/spironolactone due to renal dysfunction - 09/27/2020: Increased the dose of IV Lasix from 40 mg twice daily to 80 mg twice daily.   - Continue to monitor renal function and electrolytes.  Strict I's and O's.  Cardiac diet.  Avoid excessive sodium intake.   -3/25: She continued to remain fluid overloaded, cardiology consulted. -3/25 : Patient was started on metolazone, Continue Lasix 120 mg BID, - Patient is having cardiorenal syndrome,  Nephrology is planning short-term dialysis trial. 3/27 :Patient underwent temporary dialysis catheter. Initiation of HD 3/28 -3/28: Patient underwent hemodialysis, feels improved.  Not clear about long-term hemodialysis. -3/29: She reports feeling much improved, weight is down, having HD 3/31. - She is very poor long-term hemodialysis candidate. - Nephrology recommended continue IV diuretics to help offload on nonhemodialysis days.  Active problems : Acute kidney injury superimposed on CKD stage IV : -Likely worsened due to acute on chronic CHF, baseline creatinine 3.6-3.7 -Presented with creatinine of 4.3, continue diuresis and monitor renal function. -Creatinine worsening 2.56 today.  - Continue to monitor renal function, electrolytes, I's and O's.  3/25: Nephrology consulted,  Seems refractory to diuretics at this time and showing early uremic symptoms,   - Patient is heading towards RRT,  IR consulted for Temporary dialysis catheter. - Initiation of hemodialysis 3/28.  Not clear about long-term hemodialysis. -Patient is very poor long-term hemodialysis candidate. -   Paroxysmal atrial fibrillation -Continue Coreg 25 mg twice daily, HR slightly elevated in low 100's, in atrial fibrillation -Not on anticoagulation due to high risk of bleeding.   Essential hypertension -BP stable, continue Coreg, hydralazine, Imdur. -Continue IV Lasix   Thrombocytopenia -Unclear etiology, mild,  no active bleeding, monitor  counts.   History of endometrial  cancer with chronic abnormal uterine bleeding Anemia due to chronic blood loss and CKD stage IV -Baseline ~9 Currently stable, 10.6- 10.8. -Plan nonoperative management with the goal to control bleeding and remain comfort.   UTI: UA consistent with UTI Continue ceftriaxone.  Urine culture grew E. coli resistant to ceftriaxone Antibiotic changed to meropenum ( ESBL).   Obesity Estimated body mass index is 44.17 kg/m as calculated from the following:   Height as of this encounter: 4\' 11"  (1.499 m).   Weight as of this encounter: 99.2 kg.   DVT prophylaxis: SCDs Code Status:DNR Family Communication: No family at bed side. Disposition Plan:   Status is: Inpatient  Remains inpatient appropriate because:Inpatient level of care appropriate due to severity of illness   Dispo: The patient is from: Home              Anticipated d/c is to: SNF               Patient currently is  Not medically stable for dc   Difficult to place patient No  Consultants:    None  Procedures:  Antimicrobials:  Anti-infectives (From admission, onward)   Start     Dose/Rate Route Frequency Ordered Stop   10/06/20 1800  meropenem (MERREM) 500 mg in sodium chloride 0.9 % 100 mL IVPB        500 mg 200 mL/hr over 30 Minutes Intravenous Every 24 hours 10/06/20 1704     10/06/20 1200  fosfomycin (MONUROL) packet 3 g        3 g Oral  Once 10/06/20 1109 10/06/20 1200   10/03/20 0930  cefTRIAXone (ROCEPHIN) 1 g in sodium chloride 0.9 % 100 mL IVPB  Status:  Discontinued        1 g 200 mL/hr over 30 Minutes Intravenous Every 24 hours 10/03/20 0626 10/06/20 1109      Subjective: Patient was seen and examined at bed side.  Overnight events noted.   She reports feeling better after dialysis, she is hoping that she will continue to tolerate dialysis. I think she lacks the understanding that she would not be a long-term dialysis candidate given her malignancy. She denies any chest pain or shortness of breath.  She has Dialysis catheter in Left neck.  Objective: Vitals:   10/07/20 0709 10/07/20 0810 10/07/20 1201 10/07/20 1623  BP: 105/82 105/82 116/90 97/77  Pulse:  (!) 108 (!) 105 (!) 101  Resp: 11 17 18 16   Temp:  (!) 97.4 F (36.3 C) (!) 97.4 F (36.3 C)   TempSrc:  Oral Oral   SpO2:  100% 93% 95%  Weight:      Height:        Intake/Output Summary (Last 24 hours) at 10/07/2020 1710 Last data filed at 10/07/2020 0700 Gross per 24 hour  Intake 100 ml  Output 1049 ml  Net -949 ml   Filed Weights   10/06/20 0237 10/06/20 1759 10/07/20 0138  Weight: 97.8 kg 97.9 kg 96.9 kg    Examination:  General exam: Appears calm and comfortable , not in any acute distress Respiratory system: Clear to auscultation. Respiratory effort normal. Cardiovascular system: S1 & S2 heard, RRR. No JVD, murmurs, rubs, gallops or clicks. No pedal edema. Gastrointestinal system: Abdomen is nondistended, soft and nontender. No organomegaly or masses felt. Normal bowel sounds heard. Central nervous system: Alert and oriented. No focal neurological deficits. Extremities: Symmetric 5 x 5 power.  Bilateral pedal  edema++ Skin: No rashes, lesions or ulcers Psychiatry: Judgement and insight appear normal. Mood & affect appropriate.     Data Reviewed: I have personally reviewed following labs and imaging studies  CBC: Recent Labs  Lab 10/02/20 0305 10/03/20 0111 10/05/20 0157 10/05/20 1221 10/06/20 0252 10/07/20 0412  WBC 2.5* 2.9*  --  2.4* 2.9* 3.2*  HGB 10.4* 10.4* 11.0* 10.4* 10.7* 10.1*  HCT 31.8* 33.1* 35.6* 32.6* 33.2* 31.0*  MCV 88.8 90.7  --  90.1 89.5 88.6  PLT 103* 109*  --  78* 79* 61*   Basic Metabolic Panel: Recent Labs  Lab 10/03/20 0111 10/04/20 0335 10/05/20 0157 10/05/20 1221 10/06/20 0252 10/07/20 0412  NA 143 142 143 141 142 140  K 4.2 3.6 3.4* 3.7 3.6 3.5  CL 109 105 106 104 105 101  CO2 23 25 26 25 27 29   GLUCOSE 72 94 135* 103* 116* 88  BUN 109* 115* 114* 85* 90* 58*   CREATININE 3.98* 4.11* 4.03* 3.26* 3.47* 2.56*  CALCIUM 8.4* 8.3* 8.3* 8.6* 8.4* 8.4*  MG 2.2  --   --   --   --  2.0  PHOS 5.5*  --   --  4.5  --  3.7   GFR: Estimated Creatinine Clearance: 22.1 mL/min (A) (by C-G formula based on SCr of 2.56 mg/dL (H)). Liver Function Tests: Recent Labs  Lab 10/05/20 1221  ALBUMIN 2.7*   No results for input(s): LIPASE, AMYLASE in the last 168 hours. No results for input(s): AMMONIA in the last 168 hours. Coagulation Profile: No results for input(s): INR, PROTIME in the last 168 hours. Cardiac Enzymes: No results for input(s): CKTOTAL, CKMB, CKMBINDEX, TROPONINI in the last 168 hours. BNP (last 3 results) No results for input(s): PROBNP in the last 8760 hours. HbA1C: No results for input(s): HGBA1C in the last 72 hours. CBG: Recent Labs  Lab 10/02/20 0059 10/02/20 1556 10/03/20 0819 10/06/20 1745  GLUCAP 99 136* 88 93   Lipid Profile: No results for input(s): CHOL, HDL, LDLCALC, TRIG, CHOLHDL, LDLDIRECT in the last 72 hours. Thyroid Function Tests: No results for input(s): TSH, T4TOTAL, FREET4, T3FREE, THYROIDAB in the last 72 hours. Anemia Panel: No results for input(s): VITAMINB12, FOLATE, FERRITIN, TIBC, IRON, RETICCTPCT in the last 72 hours. Sepsis Labs: No results for input(s): PROCALCITON, LATICACIDVEN in the last 168 hours.  Recent Results (from the past 240 hour(s))  Resp Panel by RT-PCR (Flu A&B, Covid) Nasopharyngeal Swab     Status: None   Collection Time: 10/01/20 11:03 AM   Specimen: Nasopharyngeal Swab; Nasopharyngeal(NP) swabs in vial transport medium  Result Value Ref Range Status   SARS Coronavirus 2 by RT PCR NEGATIVE NEGATIVE Final    Comment: (NOTE) SARS-CoV-2 target nucleic acids are NOT DETECTED.  The SARS-CoV-2 RNA is generally detectable in upper respiratory specimens during the acute phase of infection. The lowest concentration of SARS-CoV-2 viral copies this assay can detect is 138 copies/mL. A  negative result does not preclude SARS-Cov-2 infection and should not be used as the sole basis for treatment or other patient management decisions. A negative result may occur with  improper specimen collection/handling, submission of specimen other than nasopharyngeal swab, presence of viral mutation(s) within the areas targeted by this assay, and inadequate number of viral copies(<138 copies/mL). A negative result must be combined with clinical observations, patient history, and epidemiological information. The expected result is Negative.  Fact Sheet for Patients:  EntrepreneurPulse.com.au  Fact Sheet for Healthcare Providers:  IncredibleEmployment.be  This test is no t yet approved or cleared by the [place] and  has been authorized for detection and/or diagnosis of SARS-CoV-2 by FDA under an Emergency Use Authorization (EUA). This EUA will remain  in effect (meaning this test can be used) for the duration of the COVID-19 declaration under Section 564(b)(1) of the Act, 21 U.S.C.section 360bbb-3(b)(1), unless the authorization is terminated  or revoked sooner.       Influenza A by PCR NEGATIVE NEGATIVE Final   Influenza B by PCR NEGATIVE NEGATIVE Final    Comment: (NOTE) The Xpert Xpress SARS-CoV-2/FLU/RSV plus assay is intended as an aid in the diagnosis of influenza from Nasopharyngeal swab specimens and should not be used as a sole basis for treatment. Nasal washings and aspirates are unacceptable for Xpert Xpress SARS-CoV-2/FLU/RSV testing.  Fact Sheet for Patients: EntrepreneurPulse.com.au  Fact Sheet for Healthcare Providers: IncredibleEmployment.be  This test is not yet approved or cleared by the [place] FDA and has been authorized for detection and/or diagnosis of SARS-CoV-2 by FDA under an Emergency Use Authorization (EUA). This EUA will remain in effect (meaning this test can  be used) for the duration of the COVID-19 declaration under Section 564(b)(1) of the Act, 21 U.S.C. section 360bbb-3(b)(1), unless the authorization is terminated or revoked.  Performed at [facility], [place] [address], [place], [place] [address]   Culture, Urine     Status: Abnormal   Collection Time: 10/03/20  6:00 PM   Specimen: Urine, Random  Result Value Ref Range Status   Specimen Description URINE, RANDOM  Final   Special Requests   Final    NONE Performed at [facility], [place] [address] [place], [place] [address]    Culture (A)  Final    70,000 COLONIES/mL ESCHERICHIA COLI Confirmed Extended Spectrum Beta-Lactamase Producer (ESBL).  In bloodstream infections from ESBL organisms, carbapenems are preferred over piperacillin/tazobactam. They are shown to have a lower risk of mortality.    Report Status 10/06/2020 FINAL  Final   Organism ID, Bacteria ESCHERICHIA COLI (A)  Final      Susceptibility   Escherichia coli - MIC*    AMPICILLIN >=32 RESISTANT Resistant     CEFAZOLIN >=64 RESISTANT Resistant     CEFEPIME 0.5 SENSITIVE Sensitive     CEFTRIAXONE 32 RESISTANT Resistant     CIPROFLOXACIN 1 SENSITIVE Sensitive     GENTAMICIN >=16 RESISTANT Resistant     IMIPENEM <=0.25 SENSITIVE Sensitive     NITROFURANTOIN 64 INTERMEDIATE Intermediate     TRIMETH/SULFA <=20 SENSITIVE Sensitive     AMPICILLIN/SULBACTAM 8 SENSITIVE Sensitive     PIP/TAZO <=4 SENSITIVE Sensitive     * 70,000 COLONIES/mL ESCHERICHIA COLI     Radiology Studies: No results found.  Scheduled Meds: . carvedilol  25 mg Oral BID WC  . Chlorhexidine Gluconate Cloth  6 each Topical Q0600  . ferrous sulfate  325 mg Oral BID WC  . hydrALAZINE  50 mg Oral Q8H  . hydrocortisone cream   Topical BID  . isosorbide mononitrate  30 mg Oral Daily  . nutrition supplement (JUVEN)  1 packet Oral BID BM  . potassium chloride SA  10 mEq Oral BID  . sodium chloride flush  3 mL Intravenous Q12H    Continuous Infusions: . furosemide 120 mg (10/07/20 1326)  . meropenem (MERREM) IV Stopped (10/06/20 2150)     LOS: 12 days    Time spent: 25 mins    Linden Mikes,  MD [facility]   If 7PM-7AM, please contact night-coverage

## 2020-10-07 NOTE — Progress Notes (Signed)
[facility] Progress Note    Assessment/ Plan:   AKI on CKD4, worsening, now oliguric: Likely secondary to cardiorenal and failed medical management.   --She has been offered a trial of dialysis which she started 3/28.  Palliative is following and I will work to clarify what goals of care are here as well --> patient seems to have lack of clear understanding of her malignancy and health issues.  I think she is a very poor long term dialysis candidate.  -HD #2 3/29, can plan for HD #3 3/31 -Her main issue is volume so I will continue the IV diuretics for now to help offload on non HD days -Continue to monitor daily Cr, Dose meds for GFR<15 -Monitor Daily I/Os, Daily weight  -Maintain MAP>65 for optimal renal perfusion.  -Agree with holding ACE-I, avoid further nephrotoxins including NSAIDS, Morphine.  Unless absolutely necessary, avoid CT with contrast and/or MRI with gadolinium.     Acute on chronic combined diastolic and systolic heart failure -Cardiology following  A flutter -Not a candidate for cardioversion, anticoagulation  UTI -on rocephin, ucx pending  Endometrial carcinoma -Nonoperative management, palliative management  Hypertension -BP is acceptable at the moment  Thrombocytopenia -Monitoring, per primary  Anemia due to chronic disease and history of blood loss: -Transfuse for Hgb<7 g/dL -hgb stable, if lower then please check iron panel+ferritin  Uncontrolled Diabetes Mellitus Type 2 with Hyperglycemia -Management per primary  Left Renal Atrophy -chronic, no intervention at the moment for this  Subjective:   No acute events. S/p HD yest and reporting feeling overall improved.  Still with edema but feels improving. UF was 2L yesterday with HD.    Objective:   BP 105/82 (BP Location: Right Arm)   Pulse (!) 108   Temp (!) 97.4 F (36.3 C) (Oral)   Resp 17   Ht 4\' 11"  (1.499 m)   Wt 96.9 kg   LMP 03/17/2015   SpO2 100%   BMI 43.16  kg/m   Intake/Output Summary (Last 24 hours) at 10/07/2020 0855 Last data filed at 10/07/2020 0700 Gross per 24 hour  Intake 340 ml  Output 1449 ml  Net -1109 ml   Weight change: -9.8 kg  Physical Exam: Gen:nad, lying in bed HEENT: very poor dentition CVS:s1s2, rrr Resp:cta bl SAY:TKZS, nt/nd Ext:3+ pitting edema bl le's up to thighs  Neuro: awake, alert, speech clear and coherent, moves all ext spontaneously  Imaging: DG Chest 1 View  Result Date: 10/05/2020 CLINICAL DATA:  Chest pain and CHF EXAM: CHEST  1 VIEW COMPARISON:  09/24/2020 FINDINGS: Left sided dialysis catheter tip at mild right atrium. Patient rotated left. Mild cardiomegaly. Atherosclerosis in the transverse aorta. Layering right pleural effusion is small and similar. No pneumothorax. Mild pulmonary venous congestion is new, accentuated by AP portable technique. Right greater than left base airspace disease is not significantly changed. IMPRESSION: Cardiomegaly with development of mild pulmonary venous congestion. Otherwise, similar right pleural effusion with bibasilar Airspace disease, likely atelectasis. Left-sided dialysis catheter at mid right atrium, without pneumothorax. Aortic Atherosclerosis (ICD10-I70.0). Electronically Signed   By: [name] Miyamoto M.D.   On: 10/05/2020 12:17    Labs: BMET Recent Labs  Lab 10/02/20 0305 10/03/20 0111 10/04/20 0335 10/05/20 0157 10/05/20 1221 10/06/20 0252 10/07/20 0412  NA 143 143 142 143 141 142 140  K 3.9 4.2 3.6 3.4* 3.7 3.6 3.5  CL 106 109 105 106 104 105 101  CO2 24 23 25 26 25 27 29   GLUCOSE  80 72 94 135* 103* 116* 88  BUN 105* 109* 115* 114* 85* 90* 58*  CREATININE 3.98* 3.98* 4.11* 4.03* 3.26* 3.47* 2.56*  CALCIUM 8.4* 8.4* 8.3* 8.3* 8.6* 8.4* 8.4*  PHOS  --  5.5*  --   --  4.5  --  3.7   CBC Recent Labs  Lab 10/03/20 0111 10/05/20 0157 10/05/20 1221 10/06/20 0252 10/07/20 0412  WBC 2.9*  --  2.4* 2.9* 3.2*  HGB 10.4* 11.0* 10.4* 10.7* 10.1*  HCT  33.1* 35.6* 32.6* 33.2* 31.0*  MCV 90.7  --  90.1 89.5 88.6  PLT 109*  --  78* 79* 61*    Medications:    . carvedilol  25 mg Oral BID WC  . Chlorhexidine Gluconate Cloth  6 each Topical Q0600  . ferrous sulfate  325 mg Oral BID WC  . heparin sodium (porcine)      . hydrALAZINE  50 mg Oral Q8H  . hydrocortisone cream   Topical BID  . isosorbide mononitrate  30 mg Oral Daily  . nutrition supplement (JUVEN)  1 packet Oral BID BM  . potassium chloride SA  10 mEq Oral BID  . sodium chloride flush  3 mL Intravenous Q12H     Jannifer Hick MD [facility] Pager [phone]

## 2020-10-07 NOTE — Progress Notes (Signed)
Occupational Therapy Treatment Patient Details Name: Vanessa [name] MRN: [id number] DOB: [birth year] Today's Date: 10/07/2020    History of present illness Patient is a 65 y.o. female admitted from SNF on 09/24/20 with SOB, weakness, edema and dizziness. Workup for volume overload secondary to heart failure and CKD. S/p L IJ non-tunneled HD cath placement 3/27. iHD initiated 3/28. Of note, recent admission 07/30/20-08/18/20 for CHF exacerbation, AKI and new A-fib with RVR with d/c to SNF. PMH includes endometrial carcinoma (2021), COVID-19 PNA (2021), MSSA baceteremia, essential HTN, DM2, CKD, chronic diastolic HF.   OT comments    Pt completed rolling and supine to sitting with min guard. Pt was able to complete with set up combing hair, applying lotion to arms and the top of her legs. Pt started to fatigue and needed lateral support to keep at midline position. Pt reported they are starting to get tired and requested to lay back into bed. Pt required min assist from sitting to supine. Pt will continue to follow in acute setting.       Follow Up Recommendations  SNF;Supervision/Assistance - 24 hour    Equipment Recommendations  Wheelchair (measurements OT);Wheelchair cushion (measurements OT);Hospital bed;3 in 1 bedside commode    Recommendations for Other Services      Precautions / Restrictions Precautions Precautions: Fall;Other (comment) Precaution Comments: Bladder/bowel incontinence with mobility at times; sore bottom due to hemorrhoids Restrictions Weight Bearing Restrictions: No       Mobility Bed Mobility Overal bed mobility: Needs Assistance Bed Mobility: Rolling;Sidelying to Sit;Sit to Supine Rolling: Min guard Sidelying to sit: Min guard Supine to sit: Min guard Sit to supine: Min assist        Transfers                      Balance Overall balance assessment: Needs assistance Sitting-balance support: Feet supported (needed increase in BUE as  fatigued) Sitting balance-Leahy Scale: Fair   Postural control: Left lateral lean                                 ADL either performed or assessed with clinical judgement   ADL Overall ADL's : Needs assistance/impaired Eating/Feeding: Independent;Sitting   Grooming: Wash/dry hands;Wash/dry face;Set up;Cueing for safety;Cueing for sequencing;Sitting   Upper Body Bathing: Minimal assistance;Sitting Upper Body Bathing Details (indicate cue type and reason): assisted with back     Upper Body Dressing : Minimal assistance;Sitting                     General ADL Comments: fatigues easily and needed to lay back down as started to lean to L side to keep self upright     Vision       Perception     Praxis      Cognition Arousal/Alertness: Awake/alert Behavior During Therapy: WFL for tasks assessed/performed Overall Cognitive Status: No family/caregiver present to determine baseline cognitive functioning Area of Impairment: Attention;Awareness;Problem solving                   Current Attention Level: Selective       Awareness: Emergent Problem Solving: Requires verbal cues          Exercises     Shoulder Instructions       General Comments      Pertinent Vitals/ Pain       Pain Assessment: No/denies pain  Home  Living                                          Prior Functioning/Environment              Frequency  Min 2X/week        Progress Toward Goals  OT Goals(current goals can now be found in the care plan section)  Progress towards OT goals: Progressing toward goals (but limited by fatigue)  Acute Rehab OT Goals Patient Stated Goal: to get stronger OT Goal Formulation: With patient Time For Goal Achievement: 10/12/20 Potential to Achieve Goals: Fair ADL Goals Pt Will Perform Grooming: with modified independence;standing Pt Will Perform Lower Body Bathing: with min assist;with adaptive  equipment;sit to/from stand Pt Will Perform Lower Body Dressing: with modified independence;sit to/from stand Pt Will Transfer to Toilet: with supervision;ambulating Pt Will Perform Toileting - Clothing Manipulation and hygiene: with supervision;sit to/from stand Additional ADL Goal #1: Pt will state at least 3 energy conservation strategies as instructed.  Plan Discharge plan remains appropriate    Co-evaluation                 AM-PAC OT "6 Clicks" Daily Activity     Outcome Measure   Help from another person eating meals?: None Help from another person taking care of personal grooming?: A Little Help from another person toileting, which includes using toliet, bedpan, or urinal?: A Lot Help from another person bathing (including washing, rinsing, drying)?: A Lot Help from another person to put on and taking off regular upper body clothing?: A Little Help from another person to put on and taking off regular lower body clothing?: Total 6 Click Score: 15    End of Session    OT Visit Diagnosis: Unsteadiness on feet (R26.81);Other abnormalities of gait and mobility (R26.89);Muscle weakness (generalized) (M62.81)   Activity Tolerance Patient limited by fatigue   Patient Left in bed;with call bell/phone within reach;with bed alarm set   Nurse Communication          Time: 6578-4696 OT Time Calculation (min): 35 min  Charges: OT General Charges $OT Visit: 1 Visit OT Treatments $Self Care/Home Management : 23-37 mins  Joeseph Amor OTR/L  Acute Rehab Services  [phone] office number [phone] pager number    Joeseph Amor 10/07/2020, 11:51 AM

## 2020-10-08 DIAGNOSIS — I5043 Acute on chronic combined systolic (congestive) and diastolic (congestive) heart failure: Secondary | ICD-10-CM | POA: Diagnosis not present

## 2020-10-08 LAB — BASIC METABOLIC PANEL
Anion gap: 9 (ref 5–15)
BUN: 61 mg/dL — ABNORMAL HIGH (ref 8–23)
CO2: 27 mmol/L (ref 22–32)
Calcium: 8.5 mg/dL — ABNORMAL LOW (ref 8.9–10.3)
Chloride: 105 mmol/L (ref 98–111)
Creatinine, Ser: 2.69 mg/dL — ABNORMAL HIGH (ref 0.44–1.00)
GFR, Estimated: 19 mL/min — ABNORMAL LOW (ref >60)
Glucose, Bld: 68 mg/dL — ABNORMAL LOW (ref 70–99)
Potassium: 3.5 mmol/L (ref 3.5–5.1)
Sodium: 141 mmol/L (ref 135–145)

## 2020-10-08 LAB — CBC
HCT: 34.5 % — ABNORMAL LOW (ref 36.0–46.0)
Hemoglobin: 10.8 g/dL — ABNORMAL LOW (ref 12.0–15.0)
MCH: 28.3 pg (ref 26.0–34.0)
MCHC: 31.3 g/dL (ref 30.0–36.0)
MCV: 90.6 fL (ref 80.0–100.0)
Platelets: 58 10*3/uL — ABNORMAL LOW (ref 150–400)
RBC: 3.81 MIL/uL — ABNORMAL LOW (ref 3.87–5.11)
RDW: 18.5 % — ABNORMAL HIGH (ref 11.5–15.5)
WBC: 3 10*3/uL — ABNORMAL LOW (ref 4.0–10.5)
nRBC: 5 % — ABNORMAL HIGH (ref 0.0–0.2)

## 2020-10-08 MED ORDER — HEPARIN SODIUM (PORCINE) 1000 UNIT/ML IJ SOLN
INTRAMUSCULAR | Status: AC
  Filled 2020-10-08: qty 1

## 2020-10-08 NOTE — Care Management Important Message (Addendum)
Important Message  Patient Details  Name: Vanessa [name] MRN: [id number] Date of Birth: 1955/08/08   Medicare Important Message Given:  Yes pt. On contact precaution,left IM letter on the unit.     Holli Humbles Smith 10/08/2020, 9:32 AM

## 2020-10-08 NOTE — Progress Notes (Signed)
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 Patient was recently admitted from 08/07/2020- 08/18/2020 for acute on chronic combined systolic and diastolic CHF exacerbation, AKI on CKD stage IV, and new onset A. fib with RVR.  Patient was managed with IV diuresis before transition to oral torsemide. A. fib/flutter was controlled with Coreg. Anticoagulation not started due to high risk of bleeding.  Patient was ultimately discharged to SNF with palliative care. Patient is admitted for acute on chronic CHF exacerbation,  started on diuresis.  Cardiology and nephrology has been consulted.  Patient continued to remain fluid overloaded.  Patient was started on metolazone, Patient is having cardiorenal syndrome, CHF refractory to diuretics.  Nephrology recommended short-term dialysis trial.  Patient has dialysis so far 3 times,  seems improved.  Assessment & Plan:   Principal Problem:   Acute on chronic combined systolic (congestive) and diastolic (congestive) heart failure (HCC) Active Problems:   HTN (hypertension)   Thrombocytopenia (HCC)   Acute kidney injury superimposed on CKD ([place])   Acute right-sided heart failure (HCC)   Acute on chronic combined systolic and diastolic heart failure ([place]) -She presented with progressive peripheral edema, orthopnea, DOE, BNP 1724. -2D echo 07/2020 had shown EF of 35 to 40%, -Continued IV Lasix 40 mg twice daily, received Lasix 60 mg  IV x1 in ED -Continue strict I's and O's and daily weights -Continue Coreg, not on ACE/ARB/spironolactone due to renal dysfunction - 09/27/2020: Increased the dose of IV Lasix from 40 mg twice daily to 80 mg twice daily.   - Continue to monitor renal function and electrolytes.  Strict I's and O's.  Cardiac diet.  Avoid excessive sodium intake.   -3/25: She continued to remain fluid overloaded, cardiology consulted. -3/25 : Patient was started on metolazone, Continue Lasix 120 mg BID, - Patient is having cardiorenal syndrome,  Nephrology is planning short-term dialysis trial. -3/27 : Patient underwent temporary dialysis catheter. Initiation of HD 3/28 -3/28: Patient underwent hemodialysis, feels improved.  Not clear about long-term hemodialysis. -3/29: She reports feeling much improved, weight is down, having HD 3/31. - She is very poor long-term hemodialysis candidate. - Nephrology recommended continue IV diuretics to help offload on nonhemodialysis days.  Active problems : Acute kidney injury superimposed on CKD stage IV : -Likely worsened due to acute on chronic CHF, baseline creatinine 3.6-3.7 -Presented with creatinine of 4.3, continue diuresis and monitor renal function. -Creatinine worsening 2.69 today.  - Continue to monitor renal function, electrolytes, I's and O's.  3/25: Nephrology consulted,  Seems refractory to diuretics at this time and showing early uremic symptoms,   - Patient is heading towards RRT,  IR consulted for Temporary dialysis catheter. - Initiation of hemodialysis 3/28.  Not clear about long-term hemodialysis. - Patient is very poor long-term hemodialysis candidate. -   Paroxysmal atrial fibrillation -Continue Coreg 25 mg twice daily, HR slightly elevated in low 100's, in atrial fibrillation -Not on anticoagulation due to high risk of bleeding.   Essential hypertension -BP stable,  continue Coreg, hydralazine, Imdur. -Continue IV Lasix    Thrombocytopenia -Unclear etiology, mild,  no active bleeding, monitor counts.   History of endometrial cancer with chronic abnormal uterine bleeding Anemia due to chronic blood loss and CKD stage IV -Baseline ~9 Currently stable, 10.6- 10.8. -Plan nonoperative management with the goal to control bleeding and remain comfort.   UTI: UA consistent with UTI Continue ceftriaxone.  Urine culture grew E. coli resistant to ceftriaxone Antibiotic changed to meropenum ( ESBL).   Obesity Estimated body mass index is 44.17 kg/m as calculated from the following:   Height as of this encounter: 4\' 11"  (1.499 m).   Weight as of this encounter: 99.2 kg.   DVT prophylaxis: SCDs Code Status:DNR Family Communication: No family at bed side. Disposition Plan:   Status is: Inpatient  Remains inpatient appropriate because:Inpatient level of care appropriate due to severity of illness   Dispo: The patient is from: Home              Anticipated d/c is to: SNF               Patient currently is  Not medically stable for dc   Difficult to place patient No  Consultants:    None  Procedures:  Antimicrobials:  Anti-infectives (From admission, onward)   Start     Dose/Rate Route Frequency Ordered Stop   10/06/20 1800  meropenem (MERREM) 500 mg in sodium chloride 0.9 % 100 mL IVPB        500 mg 200 mL/hr over 30 Minutes Intravenous Every 24 hours 10/06/20 1704     10/06/20 1200  fosfomycin (MONUROL) packet 3 g        3 g Oral  Once 10/06/20 1109 10/06/20 1200   10/03/20 0930  cefTRIAXone (ROCEPHIN) 1 g in sodium chloride 0.9 % 100 mL IVPB  Status:  Discontinued        1 g 200 mL/hr over 30 Minutes Intravenous Every 24 hours 10/03/20 9470 10/06/20 1109      Subjective: Patient was seen and examined at bed side.  Overnight events noted.   She reports feeling better after dialysis, she is hoping that she will continue to tolerate dialysis. I think she lacks the understanding that she would  not be a long-term dialysis candidate given her malignancy. She denies any chest pain or shortness of breath. She has Dialysis catheter in Left neck.  Objective: Vitals:   10/08/20 1330 10/08/20 1400 10/08/20 1500 10/08/20 1534  BP: 135/82 (!) 143/75 138/69 (!) 134/97  Pulse:  (!) 104 (!) 105 (!) 107  Resp:   20 20  Temp:   97.7 F (36.5 C) 97.7 F (36.5 C)  TempSrc:   Oral Oral  SpO2:   99% 100%  Weight:   94.2 kg   Height:        Intake/Output Summary (Last 24 hours) at 10/08/2020 1552 Last data filed at 10/08/2020 1500 Gross per 24 hour  Intake 410 ml  Output 3000 ml  Net -2590 ml   Filed Weights   10/08/20 0500 10/08/20 1118 10/08/20 1500  Weight: 97 kg 95.3 kg 94.2 kg    Examination:  General exam: Appears calm and comfortable , not in any acute distress Respiratory system: Clear to auscultation. Respiratory effort normal. Cardiovascular system: S1 & S2 heard, RRR. No JVD, murmurs, rubs, gallops or clicks. No pedal edema. Gastrointestinal system: Abdomen is nondistended, soft and nontender. No organomegaly or masses felt. Normal bowel sounds heard.  Central nervous system: Alert and oriented. No focal neurological deficits. Extremities: Symmetric 5 x 5 power.  Bilateral pedal edema++ Skin: No rashes, lesions or ulcers Psychiatry: Judgement and insight appear normal. Mood & affect appropriate.     Data Reviewed: I have personally reviewed following labs and imaging studies  CBC: Recent Labs  Lab 10/03/20 0111 10/05/20 0157 10/05/20 1221 10/06/20 0252 10/07/20 0412 10/08/20 0237  WBC 2.9*  --  2.4* 2.9* 3.2* 3.0*  HGB 10.4* 11.0* 10.4* 10.7* 10.1* 10.8*  HCT 33.1* 35.6* 32.6* 33.2* 31.0* 34.5*  MCV 90.7  --  90.1 89.5 88.6 90.6  PLT 109*  --  78* 79* 61* 58*   Basic Metabolic Panel: Recent Labs  Lab 10/03/20 0111 10/04/20 0335 10/05/20 0157 10/05/20 1221 10/06/20 0252 10/07/20 0412 10/08/20 0237  NA 143   < > 143 141 142 140 141  K 4.2   < > 3.4*  3.7 3.6 3.5 3.5  CL 109   < > 106 104 105 101 105  CO2 23   < > 26 25 27 29 27   GLUCOSE 72   < > 135* 103* 116* 88 68*  BUN 109*   < > 114* 85* 90* 58* 61*  CREATININE 3.98*   < > 4.03* 3.26* 3.47* 2.56* 2.69*  CALCIUM 8.4*   < > 8.3* 8.6* 8.4* 8.4* 8.5*  MG 2.2  --   --   --   --  2.0  --   PHOS 5.5*  --   --  4.5  --  3.7  --    < > = values in this interval not displayed.   GFR: Estimated Creatinine Clearance: 20.7 mL/min (A) (by C-G formula based on SCr of 2.69 mg/dL (H)). Liver Function Tests: Recent Labs  Lab 10/05/20 1221  ALBUMIN 2.7*   No results for input(s): LIPASE, AMYLASE in the last 168 hours. No results for input(s): AMMONIA in the last 168 hours. Coagulation Profile: No results for input(s): INR, PROTIME in the last 168 hours. Cardiac Enzymes: No results for input(s): CKTOTAL, CKMB, CKMBINDEX, TROPONINI in the last 168 hours. BNP (last 3 results) No results for input(s): PROBNP in the last 8760 hours. HbA1C: No results for input(s): HGBA1C in the last 72 hours. CBG: Recent Labs  Lab 10/02/20 0059 10/02/20 1556 10/03/20 0819 10/06/20 1745  GLUCAP 99 136* 88 93   Lipid Profile: No results for input(s): CHOL, HDL, LDLCALC, TRIG, CHOLHDL, LDLDIRECT in the last 72 hours. Thyroid Function Tests: No results for input(s): TSH, T4TOTAL, FREET4, T3FREE, THYROIDAB in the last 72 hours. Anemia Panel: No results for input(s): VITAMINB12, FOLATE, FERRITIN, TIBC, IRON, RETICCTPCT in the last 72 hours. Sepsis Labs: No results for input(s): PROCALCITON, LATICACIDVEN in the last 168 hours.  Recent Results (from the past 240 hour(s))  Resp Panel by RT-PCR (Flu A&B, Covid) Nasopharyngeal Swab     Status: None   Collection Time: 10/01/20 11:03 AM   Specimen: Nasopharyngeal Swab; Nasopharyngeal(NP) swabs in vial transport medium  Result Value Ref Range Status   SARS Coronavirus 2 by RT PCR NEGATIVE NEGATIVE Final    Comment: (NOTE) SARS-CoV-2 target nucleic acids are  NOT DETECTED.  The SARS-CoV-2 RNA is generally detectable in upper respiratory specimens during the acute phase of infection. The lowest concentration of SARS-CoV-2 viral copies this assay can detect is 138 copies/mL. A negative result does not preclude SARS-Cov-2 infection and should not be used as the sole basis for treatment or other patient management  decisions. A negative result may occur with  improper specimen collection/handling, submission of specimen other than nasopharyngeal swab, presence of viral mutation(s) within the areas targeted by this assay, and inadequate number of viral copies(<138 copies/mL). A negative result must be combined with clinical observations, patient history, and epidemiological information. The expected result is Negative.  Fact Sheet for Patients:  EntrepreneurPulse.com.au  Fact Sheet for Healthcare Providers:  IncredibleEmployment.be  This test is no t yet approved or cleared by the [place] FDA and  has been authorized for detection and/or diagnosis of SARS-CoV-2 by FDA under an Emergency Use Authorization (EUA). This EUA will remain  in effect (meaning this test can be used) for the duration of the COVID-19 declaration under Section 564(b)(1) of the Act, 21 U.S.C.section 360bbb-3(b)(1), unless the authorization is terminated  or revoked sooner.       Influenza A by PCR NEGATIVE NEGATIVE Final   Influenza B by PCR NEGATIVE NEGATIVE Final    Comment: (NOTE) The Xpert Xpress SARS-CoV-2/FLU/RSV plus assay is intended as an aid in the diagnosis of influenza from Nasopharyngeal swab specimens and should not be used as a sole basis for treatment. Nasal washings and aspirates are unacceptable for Xpert Xpress SARS-CoV-2/FLU/RSV testing.  Fact Sheet for Patients: EntrepreneurPulse.com.au  Fact Sheet for Healthcare Providers: IncredibleEmployment.be  This test is not  yet approved or cleared by the [place] FDA and has been authorized for detection and/or diagnosis of SARS-CoV-2 by FDA under an Emergency Use Authorization (EUA). This EUA will remain in effect (meaning this test can be used) for the duration of the COVID-19 declaration under Section 564(b)(1) of the Act, 21 U.S.C. section 360bbb-3(b)(1), unless the authorization is terminated or revoked.  Performed at [facility], [place] [address] [place], [place] [address]   Culture, Urine     Status: Abnormal   Collection Time: 10/03/20  6:00 PM   Specimen: Urine, Random  Result Value Ref Range Status   Specimen Description URINE, RANDOM  Final   Special Requests   Final    NONE Performed at [facility], [place] [address] [place], [place] [address]    Culture (A)  Final    70,000 COLONIES/mL ESCHERICHIA COLI Confirmed Extended Spectrum Beta-Lactamase Producer (ESBL).  In bloodstream infections from ESBL organisms, carbapenems are preferred over piperacillin/tazobactam. They are shown to have a lower risk of mortality.    Report Status 10/06/2020 FINAL  Final   Organism ID, Bacteria ESCHERICHIA COLI (A)  Final      Susceptibility   Escherichia coli - MIC*    AMPICILLIN >=32 RESISTANT Resistant     CEFAZOLIN >=64 RESISTANT Resistant     CEFEPIME 0.5 SENSITIVE Sensitive     CEFTRIAXONE 32 RESISTANT Resistant     CIPROFLOXACIN 1 SENSITIVE Sensitive     GENTAMICIN >=16 RESISTANT Resistant     IMIPENEM <=0.25 SENSITIVE Sensitive     NITROFURANTOIN 64 INTERMEDIATE Intermediate     TRIMETH/SULFA <=20 SENSITIVE Sensitive     AMPICILLIN/SULBACTAM 8 SENSITIVE Sensitive     PIP/TAZO <=4 SENSITIVE Sensitive     * 70,000 COLONIES/mL ESCHERICHIA COLI     Radiology Studies: No results found.  Scheduled Meds: . carvedilol  25 mg Oral BID WC  . Chlorhexidine Gluconate Cloth  6 each Topical Q0600  . ferrous sulfate  325 mg Oral BID WC  . heparin sodium (porcine)      . hydrALAZINE   50 mg Oral Q8H  . hydrocortisone cream  Topical BID  . isosorbide mononitrate  30 mg Oral Daily  . nutrition supplement (JUVEN)  1 packet Oral BID BM  . potassium chloride SA  10 mEq Oral BID  . sodium chloride flush  3 mL Intravenous Q12H   Continuous Infusions: . furosemide 120 mg (10/08/20 0853)  . meropenem (MERREM) IV 500 mg (10/07/20 2040)     LOS: 13 days    Time spent: 25 mins    Prathik Aman, MD [facility]   If 7PM-7AM, please contact night-coverage

## 2020-10-08 NOTE — Progress Notes (Signed)
[facility] Progress Note    Assessment/ Plan:   AKI on CKD4, worsening, now oliguric: Likely secondary to cardiorenal and failed medical management.     --She has been offered a trial of dialysis which she started 3/28.  The hope is with volume off loading her renal function will improve.   Palliative is following and I will work to clarify what goals of care are here as well --> patient seems to have lack of clear understanding of her malignancy and health issues.  I think she is a very poor long term dialysis candidate. -HD #2 3/29, HD #3 3/31 -Her main issue is volume so I will continue the IV diuretics for now to help offload on non HD days -Continue to monitor daily Cr, Dose meds for GFR<15 -Monitor Daily I/Os, Daily weight  -Maintain MAP>65 for optimal renal perfusion.  -Agree with holding ACE-I, avoid further nephrotoxins including NSAIDS, Morphine.  Unless absolutely necessary, avoid CT with contrast and/or MRI with gadolinium.     Acute on chronic combined diastolic and systolic heart failure -working on volume with UF and diuretics  A flutter -Not a candidate for cardioversion, anticoagulation  UTI -on rocephin, ucx pending  Endometrial carcinoma: dx late 2021, s/p palliative XRT for stage 1 -Nonoperative management, palliative management  Hypertension -BP is acceptable at the moment  Thrombocytopenia -Monitoring, per primary  Anemia due to chronic disease and history of blood loss: -Transfuse for Hgb<7 g/dL -hgb stable, if lower then please check iron panel+ferritin  Uncontrolled Diabetes Mellitus Type 2 with Hyperglycemia -Management per primary   Subjective:   Feels improving with HD.  NO new complaints.    Objective:   BP (!) 129/96 (BP Location: Right Arm)   Pulse (!) 104   Temp 97.6 F (36.4 C) (Oral)   Resp 18   Ht 4\' 11"  (1.499 m)   Wt 97 kg   LMP 03/17/2015   SpO2 96%   BMI 43.17 kg/m  No intake or output data in the  24 hours ending 10/08/20 0851 Weight change: -0.95 kg  Physical Exam: Gen:nad, lying in bed HEENT: very poor dentition CVS:s1s2, rrr Resp:cta bl MVH:QION, nt/nd Ext:3+ pitting edema bl le's up to thighs  Neuro: awake, alert, speech clear and coherent, moves all ext spontaneously  Imaging: No results found.  Labs: BMET Recent Labs  Lab 10/03/20 0111 10/04/20 0335 10/05/20 0157 10/05/20 1221 10/06/20 0252 10/07/20 0412 10/08/20 0237  NA 143 142 143 141 142 140 141  K 4.2 3.6 3.4* 3.7 3.6 3.5 3.5  CL 109 105 106 104 105 101 105  CO2 23 25 26 25 27 29 27   GLUCOSE 72 94 135* 103* 116* 88 68*  BUN 109* 115* 114* 85* 90* 58* 61*  CREATININE 3.98* 4.11* 4.03* 3.26* 3.47* 2.56* 2.69*  CALCIUM 8.4* 8.3* 8.3* 8.6* 8.4* 8.4* 8.5*  PHOS 5.5*  --   --  4.5  --  3.7  --    CBC Recent Labs  Lab 10/05/20 1221 10/06/20 0252 10/07/20 0412 10/08/20 0237  WBC 2.4* 2.9* 3.2* 3.0*  HGB 10.4* 10.7* 10.1* 10.8*  HCT 32.6* 33.2* 31.0* 34.5*  MCV 90.1 89.5 88.6 90.6  PLT 78* 79* 61* 58*    Medications:    . carvedilol  25 mg Oral BID WC  . Chlorhexidine Gluconate Cloth  6 each Topical Q0600  . ferrous sulfate  325 mg Oral BID WC  . hydrALAZINE  50 mg Oral Q8H  . hydrocortisone  cream   Topical BID  . isosorbide mononitrate  30 mg Oral Daily  . nutrition supplement (JUVEN)  1 packet Oral BID BM  . potassium chloride SA  10 mEq Oral BID  . sodium chloride flush  3 mL Intravenous Q12H     Jannifer Hick MD [facility] Pager [phone]

## 2020-10-08 NOTE — Progress Notes (Signed)
PT Cancellation Note  Patient Details Name: Vanessa [name] MRN: [id number] DOB: 1956/03/29   Cancelled Treatment:    Reason Eval/Treat Not Completed: Patient at procedure or test/unavailable. Pt at HDU, PT will follow up as time allows.   [name] 10/08/2020, 2:38 PM

## 2020-10-08 NOTE — Progress Notes (Signed)
Physical Therapy Treatment Patient Details Name: Vanessa [name] MRN: [id number] DOB: Mar 11, 1956 Today's Date: 10/08/2020    History of Present Illness Patient is a 65 y.o. female admitted from SNF on 09/24/20 with SOB, weakness, edema and dizziness. Workup for volume overload secondary to heart failure and CKD. S/p L IJ non-tunneled HD cath placement 3/27. iHD initiated 3/28. Of note, recent admission 07/30/20-08/18/20 for CHF exacerbation, AKI and new A-fib with RVR with d/c to SNF. PMH includes endometrial carcinoma (2021), COVID-19 PNA (2021), MSSA baceteremia, essential HTN, DM2, CKD, chronic diastolic HF.    PT Comments    Pt remains limited by reports of fatigue and incontinence. Pt will benefit from use of adult diaper to progress mobility next session. Pt will benefit form chair follow to progress ambulation out of room, increasing activity tolerance and allowing for multiple ambulation trials. PT continues to recommend SNF placement at this time due to limited caregiver support and poor activity tolerance.   Follow Up Recommendations  SNF;Supervision for mobility/OOB     Equipment Recommendations  3in1 (PT)    Recommendations for Other Services       Precautions / Restrictions Precautions Precautions: Fall;Other (comment) Precaution Comments: Bladder/bowel incontinence with mobility at times; sore bottom due to hemorrhoids Restrictions Weight Bearing Restrictions: No    Mobility  Bed Mobility Overal bed mobility: Needs Assistance Bed Mobility: Supine to Sit     Supine to sit: Min assist;HOB elevated          Transfers Overall transfer level: Needs assistance Equipment used: Rolling walker (2 wheeled) Transfers: Sit to/from Stand Sit to Stand: Min assist            Ambulation/Gait Ambulation/Gait assistance: Min guard Gait Distance (Feet): 30 Feet (additional trial of 12') Assistive device: Rolling walker (2 wheeled) Gait Pattern/deviations: Step-to  pattern Gait velocity: reduced Gait velocity interpretation: <1.31 ft/sec, indicative of household ambulator General Gait Details: pt with slowed step-to gait, gait distance limited by bowel movement during ambulation   Stairs             Wheelchair Mobility    Modified Rankin (Stroke Patients Only)       Balance Overall balance assessment: Needs assistance Sitting-balance support: No upper extremity supported;Feet supported Sitting balance-Leahy Scale: Good     Standing balance support: Bilateral upper extremity supported Standing balance-Leahy Scale: Poor Standing balance comment: Reliant on BUE support                            Cognition Arousal/Alertness: Awake/alert Behavior During Therapy: WFL for tasks assessed/performed Overall Cognitive Status: No family/caregiver present to determine baseline cognitive functioning                                 General Comments: pt follows commands well, is aware of her current mobility deficits but is less aware of safety when mobilizing      Exercises      General Comments General comments (skin integrity, edema, etc.): VSS on RA, pt reports fatigue quickly      Pertinent Vitals/Pain Pain Assessment: No/denies pain    Home Living                      Prior Function            PT Goals (current goals can now be found in the care  plan section) Acute Rehab PT Goals Patient Stated Goal: to get stronger Progress towards PT goals: Not progressing toward goals - comment (due to fatigue, pt somewhat self-limiting)    Frequency           PT Plan Current plan remains appropriate    Co-evaluation              AM-PAC PT "6 Clicks" Mobility   Outcome Measure  Help needed turning from your back to your side while in a flat bed without using bedrails?: A Little Help needed moving from lying on your back to sitting on the side of a flat bed without using bedrails?: A  Little Help needed moving to and from a bed to a chair (including a wheelchair)?: A Little Help needed standing up from a chair using your arms (e.g., wheelchair or bedside chair)?: A Little Help needed to walk in hospital room?: A Little Help needed climbing 3-5 steps with a railing? : A Lot 6 Click Score: 17    End of Session   Activity Tolerance: Patient limited by fatigue Patient left: in chair;with call bell/phone within reach;with chair alarm set;with nursing/sitter in room Nurse Communication: Mobility status PT Visit Diagnosis: Unsteadiness on feet (R26.81);Muscle weakness (generalized) (M62.81);Difficulty in walking, not elsewhere classified (R26.2);Pain     Time: 8416-6063 PT Time Calculation (min) (ACUTE ONLY): 23 min  Charges:  $Gait Training: 8-22 mins $Therapeutic Activity: 8-22 mins                     [name], PT, DPT Acute Rehabilitation Pager: [phone]    [name] 10/08/2020, 5:53 PM

## 2020-10-09 DIAGNOSIS — I5043 Acute on chronic combined systolic (congestive) and diastolic (congestive) heart failure: Secondary | ICD-10-CM | POA: Diagnosis not present

## 2020-10-09 DIAGNOSIS — I50811 Acute right heart failure: Secondary | ICD-10-CM | POA: Diagnosis not present

## 2020-10-09 DIAGNOSIS — D696 Thrombocytopenia, unspecified: Secondary | ICD-10-CM

## 2020-10-09 DIAGNOSIS — N179 Acute kidney failure, unspecified: Secondary | ICD-10-CM | POA: Diagnosis not present

## 2020-10-09 DIAGNOSIS — I10 Essential (primary) hypertension: Secondary | ICD-10-CM | POA: Diagnosis not present

## 2020-10-09 LAB — BASIC METABOLIC PANEL
Anion gap: 9 (ref 5–15)
BUN: 46 mg/dL — ABNORMAL HIGH (ref 8–23)
CO2: 30 mmol/L (ref 22–32)
Calcium: 8.4 mg/dL — ABNORMAL LOW (ref 8.9–10.3)
Chloride: 101 mmol/L (ref 98–111)
Creatinine, Ser: 2.35 mg/dL — ABNORMAL HIGH (ref 0.44–1.00)
GFR, Estimated: 22 mL/min — ABNORMAL LOW (ref >60)
Glucose, Bld: 72 mg/dL (ref 70–99)
Potassium: 3.1 mmol/L — ABNORMAL LOW (ref 3.5–5.1)
Sodium: 140 mmol/L (ref 135–145)

## 2020-10-09 LAB — CBC
HCT: 33.4 % — ABNORMAL LOW (ref 36.0–46.0)
Hemoglobin: 10.5 g/dL — ABNORMAL LOW (ref 12.0–15.0)
MCH: 28.3 pg (ref 26.0–34.0)
MCHC: 31.4 g/dL (ref 30.0–36.0)
MCV: 90 fL (ref 80.0–100.0)
Platelets: 51 10*3/uL — ABNORMAL LOW (ref 150–400)
RBC: 3.71 MIL/uL — ABNORMAL LOW (ref 3.87–5.11)
RDW: 18.4 % — ABNORMAL HIGH (ref 11.5–15.5)
WBC: 3.3 10*3/uL — ABNORMAL LOW (ref 4.0–10.5)
nRBC: 2.5 % — ABNORMAL HIGH (ref 0.0–0.2)

## 2020-10-09 LAB — PHOSPHORUS: Phosphorus: 3.4 mg/dL (ref 2.5–4.6)

## 2020-10-09 LAB — MAGNESIUM: Magnesium: 1.9 mg/dL (ref 1.7–2.4)

## 2020-10-09 NOTE — Progress Notes (Signed)
PROGRESS NOTE    Vanessa [name]  PZW:[id number] DOB: 06/09/1956 DOA: 09/24/2020 PCP: Patient, No Pcp Per (Inactive)   Brief Narrative: Patient is a 65 years old female with past medical history of chronic combined systolic and diastolic CHF(EF 35 to 42% by TTE 08/08/20), atrial fibrillation not on anticoagulation due to chronic bleeding from endometrial cancer, CKD stage IV, type II DM, hypertension, anemia of chronic disease, history of MSSA bacteremia, stage I endometrial cancer status post palliative radiation presented to the hospital from skilled nursing facility with complaints of shortness of breath and peripheral edema.Patient was recently admitted from 08/07/2020-08/18/2020 for acute on chronic combined systolic and diastolic CHF exacerbation, AKI on CKD stage IV, and new onset A. fib with RVR.  Patient was admitted for acute on chronic CHF exacerbation,  started on diuresis.  Cardiology and nephrology has been consulted.  Patient continues to remain fluid overloaded.  Patient was started on metolazone, Patient is having cardiorenal syndrome,  Nephrology is planning short-term dialysis trial.  Assessment & Plan:   Principal Problem:   Acute on chronic combined systolic (congestive) and diastolic (congestive) heart failure (HCC) Active Problems:   HTN (hypertension)   Thrombocytopenia (HCC)   Acute kidney injury superimposed on CKD ([place])   Acute right-sided heart failure (HCC)   Acute on chronic combined systolic and diastolic heart failure ([place]) Patient presented with progressive peripheral edema orthopnea dyspnea on exertion with elevated BNP.  2D echocardiogram on 1/22 showed ejection fraction of 35 to 40%. on IV Lasix twice a day.  On Coreg as well.  Not on ACE inhibitor's ARB or spironolactone due to renal dysfunction.  Patient continued to remain fluid overloaded despite  IV diuretics so nephrology has initiated short-term hemodialysis.  Hypokalemia potassium today at 3.1.  On oral  potassium supplement.  Closely monitor.  Acute kidney injury superimposed on CKD stage IV : Likely exacerbated by acute on chronic congestive heart failure.  Baseline creatinine of around 3.6-3.7.  Patient presented with creatinine of 4.3.  Nephrology was consulted and patient received left internal jugular dialysis catheter.  Patient was thought to be refractory to diuretics and showed early uremic symptoms.  On hemodialysis since 10/05/2020.  Patient is a poor long-term hemodialysis candidate.  Will need to work on goals of care.   Paroxysmal atrial fibrillation Currently on Coreg.  Continue to monitor.  Not on anticoagulation due to risk of bleeding.   Essential hypertension On Coreg, hydralazine, Imdur.  Continue IV Lasix.  Continue to monitor blood pressure.  Thrombocytopenia No active bleeding.  Latest platelet count of 51.   History of endometrial cancer with chronic abnormal uterine bleeding Anemia due to chronic blood loss and CKD stage IV -Patient's hemoglobin at baseline is around 9.0.  Currently hemoglobin of 10.5.  Continue conservative treatment.  Palliative care has been consulted regarding overall goals of care.   UTI:  Analysis was consistent with UTI which grew E. coli resistant to ceftriaxone.  Currently on antibiotic with meropenem for ESBL.    Morbid obesity Patient would benefit from weight loss as outpatient.  DVT prophylaxis:  Sequential compression device  Code Status:DNR  Family Communication:  None  Disposition Plan:   Status is: Inpatient  Remains inpatient appropriate because:Inpatient level of care appropriate due to severity of illness  Dispo: The patient is from: Home              Anticipated d/c is to: SNF  Patient currently is  Not medically stable for dc   Difficult to place patient No  Consultants:   Nephrology  Procedures:  Left internal jugular temporary hemodialysis catheter placement  Antimicrobials:   Anti-infectives (From admission, onward)   Start     Dose/Rate Route Frequency Ordered Stop   10/06/20 1800  meropenem (MERREM) 500 mg in sodium chloride 0.9 % 100 mL IVPB        500 mg 200 mL/hr over 30 Minutes Intravenous Every 24 hours 10/06/20 1704     10/06/20 1200  fosfomycin (MONUROL) packet 3 g        3 g Oral  Once 10/06/20 1109 10/06/20 1200   10/03/20 0930  cefTRIAXone (ROCEPHIN) 1 g in sodium chloride 0.9 % 100 mL IVPB  Status:  Discontinued        1 g 200 mL/hr over 30 Minutes Intravenous Every 24 hours 10/03/20 0832 10/06/20 1109      Subjective: Today, patient was seen and examined at bedside.  Patient states that her shortness of breath is slightly better.  Has cough with productive phlegm no fever or chills.    Objective: Vitals:   10/09/20 0010 10/09/20 0632 10/09/20 0700 10/09/20 1141  BP: (!) 131/102  111/87 (!) 144/71  Pulse: (!) 110  (!) 57 63  Resp: 18  14 20   Temp:   97.8 F (36.6 C) (!) 97.4 F (36.3 C)  TempSrc:   Axillary Axillary  SpO2: 96%  100%   Weight:  93.4 kg    Height:        Intake/Output Summary (Last 24 hours) at 10/09/2020 1406 Last data filed at 10/09/2020 1018 Gross per 24 hour  Intake 53 ml  Output 3400 ml  Net -3347 ml   Filed Weights   10/08/20 1118 10/08/20 1500 10/09/20 2637  Weight: 95.3 kg 94.2 kg 93.4 kg    Physical examination: General:  Average built, not in obvious distress HENT:   No scleral pallor or icterus noted. Oral mucosa is moist.  Internal jugular dialysis catheter in place, poor dentition. Chest:  Clear breath sounds.  Diminished breath sounds bilaterally. No crackles or wheezes.  CVS: S1 &S2 heard. No murmur.  Regular rate and rhythm. Abdomen: Soft, nontender, nondistended.  Bowel sounds are heard.   Extremities: No cyanosis, clubbing but gross bilateral lower extremity pedal edema noted.  Peripheral pulses are palpable. Psych: Alert, awake and oriented, normal mood CNS:  No cranial nerve deficits.  Power  equal in all extremities.   Skin: Warm and dry.  No rashes noted.   Data Reviewed: I have personally reviewed following labs and imaging studies  CBC: Recent Labs  Lab 10/05/20 1221 10/06/20 0252 10/07/20 0412 10/08/20 0237 10/09/20 0631  WBC 2.4* 2.9* 3.2* 3.0* 3.3*  HGB 10.4* 10.7* 10.1* 10.8* 10.5*  HCT 32.6* 33.2* 31.0* 34.5* 33.4*  MCV 90.1 89.5 88.6 90.6 90.0  PLT 78* 79* 61* 58* 51*   Basic Metabolic Panel: Recent Labs  Lab 10/03/20 0111 10/04/20 0335 10/05/20 1221 10/06/20 0252 10/07/20 0412 10/08/20 0237 10/09/20 0631  NA 143   < > 141 142 140 141 140  K 4.2   < > 3.7 3.6 3.5 3.5 3.1*  CL 109   < > 104 105 101 105 101  CO2 23   < > 25 27 29 27 30   GLUCOSE 72   < > 103* 116* 88 68* 72  BUN 109*   < > 85* 90* 58* 61* 46*  CREATININE 3.98*   < > 3.26* 3.47* 2.56* 2.69* 2.35*  CALCIUM 8.4*   < > 8.6* 8.4* 8.4* 8.5* 8.4*  MG 2.2  --   --   --  2.0  --  1.9  PHOS 5.5*  --  4.5  --  3.7  --  3.4   < > = values in this interval not displayed.   GFR: Estimated Creatinine Clearance: 23.5 mL/min (A) (by C-G formula based on SCr of 2.35 mg/dL (H)). Liver Function Tests: Recent Labs  Lab 10/05/20 1221  ALBUMIN 2.7*   No results for input(s): LIPASE, AMYLASE in the last 168 hours. No results for input(s): AMMONIA in the last 168 hours. Coagulation Profile: No results for input(s): INR, PROTIME in the last 168 hours. Cardiac Enzymes: No results for input(s): CKTOTAL, CKMB, CKMBINDEX, TROPONINI in the last 168 hours. BNP (last 3 results) No results for input(s): PROBNP in the last 8760 hours. HbA1C: No results for input(s): HGBA1C in the last 72 hours. CBG: Recent Labs  Lab 10/02/20 1556 10/03/20 0819 10/06/20 1745  GLUCAP 136* 88 93   Lipid Profile: No results for input(s): CHOL, HDL, LDLCALC, TRIG, CHOLHDL, LDLDIRECT in the last 72 hours. Thyroid Function Tests: No results for input(s): TSH, T4TOTAL, FREET4, T3FREE, THYROIDAB in the last 72  hours. Anemia Panel: No results for input(s): VITAMINB12, FOLATE, FERRITIN, TIBC, IRON, RETICCTPCT in the last 72 hours. Sepsis Labs: No results for input(s): PROCALCITON, LATICACIDVEN in the last 168 hours.  Recent Results (from the past 240 hour(s))  Resp Panel by RT-PCR (Flu A&B, Covid) Nasopharyngeal Swab     Status: None   Collection Time: 10/01/20 11:03 AM   Specimen: Nasopharyngeal Swab; Nasopharyngeal(NP) swabs in vial transport medium  Result Value Ref Range Status   SARS Coronavirus 2 by RT PCR NEGATIVE NEGATIVE Final    Comment: (NOTE) SARS-CoV-2 target nucleic acids are NOT DETECTED.  The SARS-CoV-2 RNA is generally detectable in upper respiratory specimens during the acute phase of infection. The lowest concentration of SARS-CoV-2 viral copies this assay can detect is 138 copies/mL. A negative result does not preclude SARS-Cov-2 infection and should not be used as the sole basis for treatment or other patient management decisions. A negative result may occur with  improper specimen collection/handling, submission of specimen other than nasopharyngeal swab, presence of viral mutation(s) within the areas targeted by this assay, and inadequate number of viral copies(<138 copies/mL). A negative result must be combined with clinical observations, patient history, and epidemiological information. The expected result is Negative.  Fact Sheet for Patients:  EntrepreneurPulse.com.au  Fact Sheet for Healthcare Providers:  IncredibleEmployment.be  This test is no t yet approved or cleared by the [place] FDA and  has been authorized for detection and/or diagnosis of SARS-CoV-2 by FDA under an Emergency Use Authorization (EUA). This EUA will remain  in effect (meaning this test can be used) for the duration of the COVID-19 declaration under Section 564(b)(1) of the Act, 21 U.S.C.section 360bbb-3(b)(1), unless the authorization is  terminated  or revoked sooner.       Influenza A by PCR NEGATIVE NEGATIVE Final   Influenza B by PCR NEGATIVE NEGATIVE Final    Comment: (NOTE) The Xpert Xpress SARS-CoV-2/FLU/RSV plus assay is intended as an aid in the diagnosis of influenza from Nasopharyngeal swab specimens and should not be used as a sole basis for treatment. Nasal washings and aspirates are unacceptable for Xpert Xpress SARS-CoV-2/FLU/RSV testing.  Fact Sheet for Patients: EntrepreneurPulse.com.au  Fact Sheet for Healthcare Providers: IncredibleEmployment.be  This test is not yet approved or cleared by the [place] FDA and has been authorized for detection and/or diagnosis of SARS-CoV-2 by FDA under an Emergency Use Authorization (EUA). This EUA will remain in effect (meaning this test can be used) for the duration of the COVID-19 declaration under Section 564(b)(1) of the Act, 21 U.S.C. section 360bbb-3(b)(1), unless the authorization is terminated or revoked.  Performed at [facility], [place] [address] [place], [place] [address]   Culture, Urine     Status: Abnormal   Collection Time: 10/03/20  6:00 PM   Specimen: Urine, Random  Result Value Ref Range Status   Specimen Description URINE, RANDOM  Final   Special Requests   Final    NONE Performed at [facility], [place] [address] [place], [place] [address]    Culture (A)  Final    70,000 COLONIES/mL ESCHERICHIA COLI Confirmed Extended Spectrum Beta-Lactamase Producer (ESBL).  In bloodstream infections from ESBL organisms, carbapenems are preferred over piperacillin/tazobactam. They are shown to have a lower risk of mortality.    Report Status 10/06/2020 FINAL  Final   Organism ID, Bacteria ESCHERICHIA COLI (A)  Final      Susceptibility   Escherichia coli - MIC*    AMPICILLIN >=32 RESISTANT Resistant     CEFAZOLIN >=64 RESISTANT Resistant     CEFEPIME 0.5 SENSITIVE Sensitive     CEFTRIAXONE 32  RESISTANT Resistant     CIPROFLOXACIN 1 SENSITIVE Sensitive     GENTAMICIN >=16 RESISTANT Resistant     IMIPENEM <=0.25 SENSITIVE Sensitive     NITROFURANTOIN 64 INTERMEDIATE Intermediate     TRIMETH/SULFA <=20 SENSITIVE Sensitive     AMPICILLIN/SULBACTAM 8 SENSITIVE Sensitive     PIP/TAZO <=4 SENSITIVE Sensitive     * 70,000 COLONIES/mL ESCHERICHIA COLI     Radiology Studies: No results found.  Scheduled Meds: . carvedilol  25 mg Oral BID WC  . Chlorhexidine Gluconate Cloth  6 each Topical Q0600  . ferrous sulfate  325 mg Oral BID WC  . hydrALAZINE  50 mg Oral Q8H  . hydrocortisone cream   Topical BID  . isosorbide mononitrate  30 mg Oral Daily  . nutrition supplement (JUVEN)  1 packet Oral BID BM  . potassium chloride SA  10 mEq Oral BID  . sodium chloride flush  3 mL Intravenous Q12H   Continuous Infusions: . furosemide 120 mg (10/09/20 1014)  . meropenem (MERREM) IV 500 mg (10/08/20 1646)     LOS: 14 days    Flora Lipps, MD [facility]  If 7PM-7AM, please contact night-coverage

## 2020-10-09 NOTE — Progress Notes (Signed)
Occupational Therapy Treatment Patient Details Name: Vanessa [name] MRN: [id number] DOB: 10-19-1955 Today's Date: 10/09/2020    History of present illness Patient is a 65 y.o. female admitted from SNF on 09/24/20 with SOB, weakness, edema and dizziness. Workup for volume overload secondary to heart failure and CKD. S/p L IJ non-tunneled HD cath placement 3/27. iHD initiated 3/28. Of note, recent admission 07/30/20-08/18/20 for CHF exacerbation, AKI and new A-fib with RVR with d/c to SNF. PMH includes endometrial carcinoma (2021), COVID-19 PNA (2021), MSSA baceteremia, essential HTN, DM2, CKD, chronic diastolic HF.   OT comments  Pt making gradual progress towards OT goals this session. Pt limited by bowel incontinence this session. Pt currently requires total A for anterior/posterior pericare from bed level, MIN A for bed mobility to roll R<>L.  Deferred further functional mobility d/t bowel incontinence. Pt would continue to benefit from skilled occupational therapy while admitted and after d/c to address the below listed limitations in order to improve overall functional mobility and facilitate independence with BADL participation. DC plan remains appropriate, will follow acutely per POC.     Follow Up Recommendations  SNF;Supervision/Assistance - 24 hour    Equipment Recommendations  Wheelchair (measurements OT);Wheelchair cushion (measurements OT);Hospital bed;3 in 1 bedside commode    Recommendations for Other Services      Precautions / Restrictions Precautions Precautions: Fall;Other (comment) Precaution Comments: Bladder/bowel incontinence with mobility at times; sore bottom due to hemorrhoids Restrictions Weight Bearing Restrictions: No       Mobility Bed Mobility Overal bed mobility: Needs Assistance Bed Mobility: Rolling Rolling: Min assist         General bed mobility comments: MIN A to roll fully into sidelying, deferred further bed mobiltiy d/t bowel incontinence     Transfers                 General transfer comment: deferred further functional mobiltiy d/t bowel incontinence    Balance                                           ADL either performed or assessed with clinical judgement   ADL Overall ADL's : Needs assistance/impaired             Lower Body Bathing: Total assistance;Bed level Lower Body Bathing Details (indicate cue type and reason): simulated via posterior pericare from bed level           Toilet Transfer Details (indicate cue type and reason): deferred d/t bowel incontinence Toileting- Clothing Manipulation and Hygiene: Total assistance;Bed level Toileting - Clothing Manipulation Details (indicate cue type and reason): total A from bed level for posterior and anterior pericare from bed level     Functional mobility during ADLs: Minimal assistance (bed mobility only, rolling R<>L) General ADL Comments: pt limited by bowel incontinence this session with pt needing total A for posterior/ anterior pericare and MINA  to roll fully into sidelying     Vision       Perception     Praxis      Cognition Arousal/Alertness: Awake/alert Behavior During Therapy: WFL for tasks assessed/performed Overall Cognitive Status: No family/caregiver present to determine baseline cognitive functioning                                 General Comments: pt following commands,  can be easily distracted at times but pleasant and cooperative, very aware of deficits and able to direct level of care        Exercises     Shoulder Instructions       General Comments vss on RA, applied lotion to pts back ( per pt request) and cream to pts bottock NT present    Pertinent Vitals/ Pain       Pain Assessment: Faces Faces Pain Scale: Hurts a little bit Pain Location: Buttocks with pericare, pain from itching at HD cath site, and burning pain when urinating- RN aware Pain Descriptors / Indicators:  Discomfort;Burning Pain Intervention(s): Limited activity within patient's tolerance;Monitored during session;Repositioned;Other (comment) (alerted RN and RN enter during session to provide pain meds)  Home Living                                          Prior Functioning/Environment              Frequency  Min 2X/week        Progress Toward Goals  OT Goals(current goals can now be found in the care plan section)  Progress towards OT goals: Progressing toward goals  Acute Rehab OT Goals Patient Stated Goal: to get her color pencils sharpened OT Goal Formulation: With patient Time For Goal Achievement: 10/12/20 Potential to Achieve Goals: [place] Discharge plan remains appropriate;Frequency remains appropriate    Co-evaluation                 AM-PAC OT "6 Clicks" Daily Activity     Outcome Measure   Help from another person eating meals?: None Help from another person taking care of personal grooming?: A Little Help from another person toileting, which includes using toliet, bedpan, or urinal?: A Lot Help from another person bathing (including washing, rinsing, drying)?: A Lot Help from another person to put on and taking off regular upper body clothing?: A Little Help from another person to put on and taking off regular lower body clothing?: Total 6 Click Score: 15    End of Session    OT Visit Diagnosis: Unsteadiness on feet (R26.81);Other abnormalities of gait and mobility (R26.89);Muscle weakness (generalized) (M62.81);Pain Pain - part of body:  (buttock, HD site ( itching) and burning pain when urinating)   Activity Tolerance Patient tolerated treatment well   Patient Left in bed;with call bell/phone within reach;with nursing/sitter in room   Nurse Communication Mobility status        Time: 1353-1431 OT Time Calculation (min): 38 min  Charges: OT General Charges $OT Visit: 1 Visit OT Treatments $Self Care/Home  Management : 38-52 mins  Vanessa [name]., COTA/L Acute Rehabilitation Services [phone]    Precious Haws 10/09/2020, 4:07 PM

## 2020-10-09 NOTE — Progress Notes (Signed)
[facility] Progress Note    Assessment/ Plan:   AKI on CKD4, worsening, now oliguric: Likely secondary to cardiorenal and failed medical management.     --She has been offered a trial of dialysis which she started 3/28.  The hope is with volume off loading her renal function will improve.   Palliative is following and I will work to clarify what goals of care are here as well --> patient seems to have lack of clear understanding of her malignancy and health issues.  I think she is a very poor long term dialysis candidate.  She tells me today she doesn't plan to continue with dialysis.  -HD #2 3/29, HD #3 3/31 -Her main issue is volume so I will continue the IV diuretics for now to help offload  -Continue to monitor daily Cr, Dose meds for GFR<15 -Monitor Daily I/Os, Daily weight  -Maintain MAP>65 for optimal renal perfusion.  -Agree with holding ACE-I, avoid further nephrotoxins including NSAIDS, Morphine.  Unless absolutely necessary, avoid CT with contrast and/or MRI with gadolinium.     Acute on chronic combined diastolic and systolic heart failure -working on volume with UF and diuretics  A flutter -Not a candidate for cardioversion, anticoagulation  UTI -on rocephin, ucx pending  Endometrial carcinoma: dx late 2021, s/p palliative XRT for stage 1 -Nonoperative management, palliative management  Hypertension -BP is acceptable at the moment  Thrombocytopenia -Monitoring, per primary  Anemia due to chronic disease and history of blood loss: -Transfuse for Hgb<7 g/dL -hgb stable, if lower then please check iron panel+ferritin  Uncontrolled Diabetes Mellitus Type 2 with Hyperglycemia -Management per primary  Dispo:  I have had a challenging time ascertaining from patient her current functional status and understanding of her health problems.  Outpt palliative care is involved her a note left in her heart yesterday - I called Dee, her hospital liason at  [facility]; Dee didn't know any details of the case so she had Ms. Rocca's outpt NP call me.  Kennis Carina called and said she had met the patient once but didn't know any details of her current status or where she was living, etc.  I am hoping palliative care here can help clarify her status and help define [place].  It looks like palliative care was able to have a good connection with her previously in the hospitalization.  I don't think she is a candidate for long term dialysis.   I will continue it through the weekend but if her renal function and functional status hasn't improved I would not plan to continue it.   She volunteered today that she doesn't plan to continue dialysis.     Subjective:   HD 3L yesterday.  NO new complaints.    Objective:   BP (!) 144/71 (BP Location: Left Arm)   Pulse 63   Temp (!) 97.4 F (36.3 C) (Axillary)   Resp 20   Ht _0  (1.499 m)   Wt 93.4 kg   LMP 03/17/2015   SpO2 100%   BMI 41.61 kg/m   Intake/Output Summary (Last 24 hours) at 10/09/2020 1157 Last data filed at 10/09/2020 1018 Gross per 24 hour  Intake 53 ml  Output 3400 ml  Net -3347 ml   Weight change: -1.65 kg  Physical Exam: Gen:nad, lying in bed HEENT: very poor dentition CVS:s1s2, rrr Resp:cta bl KMQ:KMMN, nt/nd Ext:3+ pitting edema bl le's up to thighs  Neuro: awake, alert, speech clear and coherent, moves all ext spontaneously  Imaging: No results found.  Labs: BMET Recent Labs  Lab 10/03/20 0111 10/04/20 0335 10/05/20 0157 10/05/20 1221 10/06/20 0252 10/07/20 0412 10/08/20 0237 10/09/20 0631  NA 143 142 143 141 142 140 141 140  K 4.2 3.6 3.4* 3.7 3.6 3.5 3.5 3.1*  CL 109 105 106 104 105 101 105 101  CO2 _0 GLUCOSE 72 94 135* 103* 116* 88 68* 72  BUN 109* 115* 114* 85* 90* 58* 61* 46*  CREATININE 3.98* 4.11* 4.03* 3.26* 3.47* 2.56* 2.69* 2.35*  CALCIUM 8.4* 8.3* 8.3* 8.6* 8.4* 8.4* 8.5* 8.4*  PHOS 5.5*  --   --  4.5  --  3.7  --  3.4   CBC Recent  Labs  Lab 10/06/20 0252 10/07/20 0412 10/08/20 0237 10/09/20 0631  WBC 2.9* 3.2* 3.0* 3.3*  HGB 10.7* 10.1* 10.8* 10.5*  HCT 33.2* 31.0* 34.5* 33.4*  MCV 89.5 88.6 90.6 90.0  PLT 79* 61* 58* 51*    Medications:    . carvedilol  25 mg Oral BID WC  . Chlorhexidine Gluconate Cloth  6 each Topical Q0600  . ferrous sulfate  325 mg Oral BID WC  . hydrALAZINE  50 mg Oral Q8H  . hydrocortisone cream   Topical BID  . isosorbide mononitrate  30 mg Oral Daily  . nutrition supplement (JUVEN)  1 packet Oral BID BM  . potassium chloride SA  10 mEq Oral BID  . sodium chloride flush  3 mL Intravenous Q12H     Jannifer Hick MD [facility] Pager [phone]

## 2020-10-10 ENCOUNTER — Inpatient Hospital Stay (HOSPITAL_COMMUNITY): Payer: Medicare Other

## 2020-10-10 DIAGNOSIS — I5043 Acute on chronic combined systolic (congestive) and diastolic (congestive) heart failure: Secondary | ICD-10-CM | POA: Diagnosis not present

## 2020-10-10 LAB — RENAL FUNCTION PANEL
Albumin: 2.6 g/dL — ABNORMAL LOW (ref 3.5–5.0)
Anion gap: 10 (ref 5–15)
BUN: 53 mg/dL — ABNORMAL HIGH (ref 8–23)
CO2: 25 mmol/L (ref 22–32)
Calcium: 8 mg/dL — ABNORMAL LOW (ref 8.9–10.3)
Chloride: 100 mmol/L (ref 98–111)
Creatinine, Ser: 2.77 mg/dL — ABNORMAL HIGH (ref 0.44–1.00)
GFR, Estimated: 18 mL/min — ABNORMAL LOW (ref >60)
Glucose, Bld: 158 mg/dL — ABNORMAL HIGH (ref 70–99)
Phosphorus: 3.6 mg/dL (ref 2.5–4.6)
Potassium: 2.8 mmol/L — ABNORMAL LOW (ref 3.5–5.1)
Sodium: 135 mmol/L (ref 135–145)

## 2020-10-10 MED ORDER — TORSEMIDE 20 MG PO TABS
100.0000 mg | ORAL_TABLET | Freq: Every day | ORAL | Status: DC
  Administered 2020-10-11 – 2020-10-12 (×2): 100 mg via ORAL
  Filled 2020-10-10 (×2): qty 5

## 2020-10-10 MED ORDER — POTASSIUM CHLORIDE CRYS ER 20 MEQ PO TBCR
40.0000 meq | EXTENDED_RELEASE_TABLET | Freq: Once | ORAL | Status: AC
  Administered 2020-10-10: 40 meq via ORAL
  Filled 2020-10-10: qty 2

## 2020-10-10 NOTE — Progress Notes (Signed)
[facility] hemocath found partially out by [employer], with dsg loosely attached , suture in place, nono bleeding nor redness on site noted. New dsg applied and secured line.. DR Beryle Quant notified, Portable CXR ordered to check placement.Requested.,Instructed not to touch line site. Vital signs stable , denies any pain nor discomfort.Marland Kitchen

## 2020-10-10 NOTE — Progress Notes (Signed)
  [facility] Progress Note    Assessment/ Plan:   AKI on CKD4, worsening, now oliguric: Likely secondary to cardiorenal and failed medical management.     --She underwent a trial of dialysis with the hope that with volume off loading her renal function will improve.   Unfortunately this has not been the case.  She is not a long term dialysis candidate in light of her numerous comorbidities.  We had a lengthy discussion about this again today. I have recommended hospice to her and she is receptive.  She has somewhat limited insight to her medical issues but I have plainly stated I expect she will pass away within a month. She has palliative care services in the community already and now can go to hospice.  - Remove HD catheter, IV team consulted  Other medical issues per primary.  Dispo: Palliative care has seen her earlier in the hospitalization.  Hospice referral per above.    Nothing further to add.    Subjective:   HD catheter partially retracted overnight.  Anuric overnight.   This AM she has no new complaints.     Objective:   BP (!) 137/97 (BP Location: Left Arm)   Pulse (!) 105   Temp 98.1 F (36.7 C) (Oral)   Resp 12   Ht 4\' 11"  (1.499 m)   Wt 93.4 kg   LMP 03/17/2015   SpO2 100%   BMI 41.61 kg/m   Intake/Output Summary (Last 24 hours) at 10/10/2020 0748 Last data filed at 10/09/2020 1018 Gross per 24 hour  Intake 3 ml  Output --  Net 3 ml   Weight change:   Physical Exam: Gen:nad, sitting up in chair HEENT: very poor dentition CVS:s1s2, rrr Resp:cta bl ZOX:WRUE, nt/nd Ext:3+ pitting edema bl le's up to thighs  Neuro: awake, alert, speech clear and coherent, moves all ext spontaneously  Imaging: No results found.  Labs: BMET Recent Labs  Lab 10/04/20 0335 10/05/20 0157 10/05/20 1221 10/06/20 0252 10/07/20 0412 10/08/20 0237 10/09/20 0631  NA 142 143 141 142 140 141 140  K 3.6 3.4* 3.7 3.6 3.5 3.5 3.1*  CL 105 106 104 105 101 105  101  CO2 25 26 25 27 29 27 30   GLUCOSE 94 135* 103* 116* 88 68* 72  BUN 115* 114* 85* 90* 58* 61* 46*  CREATININE 4.11* 4.03* 3.26* 3.47* 2.56* 2.69* 2.35*  CALCIUM 8.3* 8.3* 8.6* 8.4* 8.4* 8.5* 8.4*  PHOS  --   --  4.5  --  3.7  --  3.4   CBC Recent Labs  Lab 10/06/20 0252 10/07/20 0412 10/08/20 0237 10/09/20 0631  WBC 2.9* 3.2* 3.0* 3.3*  HGB 10.7* 10.1* 10.8* 10.5*  HCT 33.2* 31.0* 34.5* 33.4*  MCV 89.5 88.6 90.6 90.0  PLT 79* 61* 58* 51*    Medications:    . carvedilol  25 mg Oral BID WC  . Chlorhexidine Gluconate Cloth  6 each Topical Q0600  . ferrous sulfate  325 mg Oral BID WC  . hydrALAZINE  50 mg Oral Q8H  . hydrocortisone cream   Topical BID  . isosorbide mononitrate  30 mg Oral Daily  . nutrition supplement (JUVEN)  1 packet Oral BID BM  . potassium chloride SA  10 mEq Oral BID  . sodium chloride flush  3 mL Intravenous Q12H     Jannifer Hick MD [facility] Pager [phone]

## 2020-10-10 NOTE — Progress Notes (Signed)
PROGRESS NOTE  Van[name] KXF:[id number] DOB: [birth year] DOA: 09/24/2020 PCP: Patient, No Pcp Per (Inactive)  HPI/Recap of past 24 hours: Patient is a 65 years old female with past medical history of chronic combined systolic and diastolic CHF(EF 35 to 69% by TTE 08/08/20), atrial fibrillation not on anticoagulation due to chronic bleeding from endometrial cancer, CKD stage IV, type II DM, hypertension, anemia of chronic disease, history of MSSA bacteremia, stage I endometrial cancer status post palliative radiation presented to the hospital from skilled nursing facility with complaints of shortness of breath and peripheral edema.Patient was recently admitted from 08/07/2020-08/18/2020 for acute on chronic combined systolic and diastolic CHF exacerbation, AKI on CKD stage IV, and new onset A. fib with RVR. Patient was admitted for acute on chronic CHF exacerbation,  started on diuresis.  Cardiology and nephrology has been consulted.  Patient continues to remain fluid overloaded.  Patient was started on metolazone, Patient is having cardiorenal syndrome,  Nephrology is planning short-term dialysis trial.  10/10/20: Seen and examined at bedside.  She denies chest pain or dyspnea.  Assessment/Plan: Principal Problem:   Acute on chronic combined systolic (congestive) and diastolic (congestive) heart failure (HCC) Active Problems:   HTN (hypertension)   Thrombocytopenia (HCC)   Acute kidney injury superimposed on CKD ([place])   Acute right-sided heart failure (HCC)  Acute on chronic combined systolic and diastolic heart failure  Patient presented with progressive peripheral edema orthopnea dyspnea on exertion with elevated BNP.  2D echocardiogram on 1/22 showed ejection fraction of 35 to 40%. on IV Lasix twice a day.  On Coreg as well.  Not on ACE inhibitor's ARB or spironolactone due to renal dysfunction.  Patient continued to remain fluid overloaded despite  IV diuretics so nephrology has initiated  short-term hemodialysis.  Refractory Hypokalemia  Potassium 2.8 from 3.1.   On oral potassium supplement.   Closely monitor.  Acute kidney injury superimposed on CKD stage IV  Likely exacerbated by acute on chronic congestive heart failure.   Baseline creatinine of around 3.6-3.7.   Patient presented with creatinine of 4.3.   Nephrology was consulted and patient received left internal jugular dialysis catheter.   Patient was thought to be refractory to diuretics and showed early uremic symptoms.   On hemodialysis since 10/05/2020.   Patient is a poor long-term hemodialysis candidate.   Will need to work on goals of care. Creatinine up trending 2.77 from 2.35  Paroxysmal atrial fibrillation Currently on Coreg.   Continue to monitor.   Not on anticoagulation due to risk of bleeding.  Essential hypertension BP stable On Coreg, hydralazine, Imdur.  Continue IV Lasix.   Continue to monitor blood pressure.  Thrombocytopenia No active bleeding.   Latest platelet count of 51K from 58K.  History of endometrial cancer with chronic abnormal uterine bleeding Anemia due to chronic blood loss and CKD stage IV -Patient's hemoglobin at baseline is around 9.0.  Currently hemoglobin of 10.5.  Continue conservative treatment.  Palliative care has been consulted regarding overall goals of care.  ESBL Ecoli UTI:  Analysis was consistent with UTI which grew E. coli resistant to ceftriaxone.  Currently on antibiotic with meropenem for ESBL.   Morbid obesity Patient would benefit from weight loss as outpatient.  DVT prophylaxis:  Sequential compression device  Code Status:DNR  Family Communication:  None  Disposition Plan:   Status is: Inpatient  Remains inpatient appropriate because:Inpatient level of care appropriate due to severity of illness  Dispo: The patient is  from: Home  Anticipated d/c is to: SNF   Patient currently is  Not medically  stable for dc              Difficult to place patient No  Consultants:   Nephrology  Procedures:  Left internal jugular temporary hemodialysis catheter placement  Antimicrobials:             Anti-infectives (From admission, onward)     Start     Dose/Rate Route Frequency Ordered Stop   10/06/20 1800  meropenem (MERREM) 500 mg in sodium chloride 0.9 % 100 mL IVPB        500 mg 200 mL/hr over 30 Minutes Intravenous Every 24 hours 10/06/20 1704     10/06/20 1200  fosfomycin (MONUROL) packet 3 g        3 g Oral  Once 10/06/20 1109 10/06/20 1200   10/03/20 0930  cefTRIAXone (ROCEPHIN) 1 g in sodium chloride 0.9 % 100 mL IVPB  Status:  Discontinued        1 g 200 mL/hr over 30 Minutes Intravenous Every 24 hours 10/03/20 0832 10/06/20 1109            Objective: Vitals:   10/09/20 2340 10/10/20 0429 10/10/20 0746 10/10/20 1210  BP: (!) 130/93 (!) 134/94 (!) 137/97 109/78  Pulse: (!) 105 (!) 58 (!) 105 (!) 57  Resp: 17 20 12 11   Temp: 98.2 F (36.8 C) 98 F (36.7 C) 98.1 F (36.7 C) 98 F (36.7 C)  TempSrc: Oral Oral Oral Oral  SpO2: 100% 100% 100% 100%  Weight:      Height:        Intake/Output Summary (Last 24 hours) at 10/10/2020 1504 Last data filed at 10/10/2020 0800 Gross per 24 hour  Intake 240 ml  Output --  Net 240 ml   Filed Weights   10/08/20 1118 10/08/20 1500 10/09/20 3903  Weight: 95.3 kg 94.2 kg 93.4 kg    Exam:  . General: 65 y.o. year-old female well developed well nourished in no acute distress.  Alert and oriented x3. . Cardiovascular: Regular rate and rhythm with no rubs or gallops.  No thyromegaly or JVD noted.   Marland Kitchen Respiratory: Clear to auscultation with no wheezes or rales. Good inspiratory effort. . Abdomen: Soft nontender nondistended with normal bowel sounds x4 quadrants. . Musculoskeletal: No lower extremity edema. . Skin: No ulcerative lesions noted or rashes. . Psychiatry: Mood is appropriate for condition and  setting   Data Reviewed: CBC: Recent Labs  Lab 10/05/20 1221 10/06/20 0252 10/07/20 0412 10/08/20 0237 10/09/20 0631  WBC 2.4* 2.9* 3.2* 3.0* 3.3*  HGB 10.4* 10.7* 10.1* 10.8* 10.5*  HCT 32.6* 33.2* 31.0* 34.5* 33.4*  MCV 90.1 89.5 88.6 90.6 90.0  PLT 78* 79* 61* 58* 51*   Basic Metabolic Panel: Recent Labs  Lab 10/05/20 1221 10/06/20 0252 10/07/20 0412 10/08/20 0237 10/09/20 0631 10/10/20 0856  NA 141 142 140 141 140 135  K 3.7 3.6 3.5 3.5 3.1* 2.8*  CL 104 105 101 105 101 100  CO2 25 27 29 27 30 25   GLUCOSE 103* 116* 88 68* 72 158*  BUN 85* 90* 58* 61* 46* 53*  CREATININE 3.26* 3.47* 2.56* 2.69* 2.35* 2.77*  CALCIUM 8.6* 8.4* 8.4* 8.5* 8.4* 8.0*  MG  --   --  2.0  --  1.9  --   PHOS 4.5  --  3.7  --  3.4 3.6   GFR: Estimated Creatinine Clearance: 20  mL/min (A) (by C-G formula based on SCr of 2.77 mg/dL (H)). Liver Function Tests: Recent Labs  Lab 10/05/20 1221 10/10/20 0856  ALBUMIN 2.7* 2.6*   No results for input(s): LIPASE, AMYLASE in the last 168 hours. No results for input(s): AMMONIA in the last 168 hours. Coagulation Profile: No results for input(s): INR, PROTIME in the last 168 hours. Cardiac Enzymes: No results for input(s): CKTOTAL, CKMB, CKMBINDEX, TROPONINI in the last 168 hours. BNP (last 3 results) No results for input(s): PROBNP in the last 8760 hours. HbA1C: No results for input(s): HGBA1C in the last 72 hours. CBG: Recent Labs  Lab 10/06/20 1745  GLUCAP 93   Lipid Profile: No results for input(s): CHOL, HDL, LDLCALC, TRIG, CHOLHDL, LDLDIRECT in the last 72 hours. Thyroid Function Tests: No results for input(s): TSH, T4TOTAL, FREET4, T3FREE, THYROIDAB in the last 72 hours. Anemia Panel: No results for input(s): VITAMINB12, FOLATE, FERRITIN, TIBC, IRON, RETICCTPCT in the last 72 hours. Urine analysis:    Component Value Date/Time   COLORURINE YELLOW 10/02/2020 1719   APPEARANCEUR TURBID (A) 10/02/2020 1719   LABSPEC 1.012  10/02/2020 1719   PHURINE 5.0 10/02/2020 1719   GLUCOSEU NEGATIVE 10/02/2020 1719   HGBUR MODERATE (A) 10/02/2020 1719   BILIRUBINUR NEGATIVE 10/02/2020 1719   KETONESUR NEGATIVE 10/02/2020 1719   PROTEINUR 100 (A) 10/02/2020 1719   UROBILINOGEN 0.2 03/06/2014 1644   NITRITE NEGATIVE 10/02/2020 1719   LEUKOCYTESUR LARGE (A) 10/02/2020 1719   Sepsis Labs: @LABRCNTIP (procalcitonin:4,lacticidven:4)  ) Recent Results (from the past 240 hour(s))  Resp Panel by RT-PCR (Flu A&B, Covid) Nasopharyngeal Swab     Status: None   Collection Time: 10/01/20 11:03 AM   Specimen: Nasopharyngeal Swab; Nasopharyngeal(NP) swabs in vial transport medium  Result Value Ref Range Status   SARS Coronavirus 2 by RT PCR NEGATIVE NEGATIVE Final    Comment: (NOTE) SARS-CoV-2 target nucleic acids are NOT DETECTED.  The SARS-CoV-2 RNA is generally detectable in upper respiratory specimens during the acute phase of infection. The lowest concentration of SARS-CoV-2 viral copies this assay can detect is 138 copies/mL. A negative result does not preclude SARS-Cov-2 infection and should not be used as the sole basis for treatment or other patient management decisions. A negative result may occur with  improper specimen collection/handling, submission of specimen other than nasopharyngeal swab, presence of viral mutation(s) within the areas targeted by this assay, and inadequate number of viral copies(<138 copies/mL). A negative result must be combined with clinical observations, patient history, and epidemiological information. The expected result is Negative.  Fact Sheet for Patients:  EntrepreneurPulse.com.au  Fact Sheet for Healthcare Providers:  IncredibleEmployment.be  This test is no t yet approved or cleared by the [place] FDA and  has been authorized for detection and/or diagnosis of SARS-CoV-2 by FDA under an Emergency Use Authorization (EUA). This EUA will  remain  in effect (meaning this test can be used) for the duration of the COVID-19 declaration under Section 564(b)(1) of the Act, 21 U.S.C.section 360bbb-3(b)(1), unless the authorization is terminated  or revoked sooner.       Influenza A by PCR NEGATIVE NEGATIVE Final   Influenza B by PCR NEGATIVE NEGATIVE Final    Comment: (NOTE) The Xpert Xpress SARS-CoV-2/FLU/RSV plus assay is intended as an aid in the diagnosis of influenza from Nasopharyngeal swab specimens and should not be used as a sole basis for treatment. Nasal washings and aspirates are unacceptable for Xpert Xpress SARS-CoV-2/FLU/RSV testing.  Fact Sheet for Patients: EntrepreneurPulse.com.au  Fact Sheet for Healthcare Providers: IncredibleEmployment.be  This test is not yet approved or cleared by the [place] FDA and has been authorized for detection and/or diagnosis of SARS-CoV-2 by FDA under an Emergency Use Authorization (EUA). This EUA will remain in effect (meaning this test can be used) for the duration of the COVID-19 declaration under Section 564(b)(1) of the Act, 21 U.S.C. section 360bbb-3(b)(1), unless the authorization is terminated or revoked.  Performed at [facility], [place] [address] [place], [place] [address]   Culture, Urine     Status: Abnormal   Collection Time: 10/03/20  6:00 PM   Specimen: Urine, Random  Result Value Ref Range Status   Specimen Description URINE, RANDOM  Final   Special Requests   Final    NONE Performed at [facility], [place] [address] [name], [place] [address]    Culture (A)  Final    70,000 COLONIES/mL ESCHERICHIA COLI Confirmed Extended Spectrum Beta-Lactamase Producer (ESBL).  In bloodstream infections from ESBL organisms, carbapenems are preferred over piperacillin/tazobactam. They are shown to have a lower risk of mortality.    Report Status 10/06/2020 FINAL  Final   Organism ID, Bacteria ESCHERICHIA COLI  (A)  Final      Susceptibility   Escherichia coli - MIC*    AMPICILLIN >=32 RESISTANT Resistant     CEFAZOLIN >=64 RESISTANT Resistant     CEFEPIME 0.5 SENSITIVE Sensitive     CEFTRIAXONE 32 RESISTANT Resistant     CIPROFLOXACIN 1 SENSITIVE Sensitive     GENTAMICIN >=16 RESISTANT Resistant     IMIPENEM <=0.25 SENSITIVE Sensitive     NITROFURANTOIN 64 INTERMEDIATE Intermediate     TRIMETH/SULFA <=20 SENSITIVE Sensitive     AMPICILLIN/SULBACTAM 8 SENSITIVE Sensitive     PIP/TAZO <=4 SENSITIVE Sensitive     * 70,000 COLONIES/mL ESCHERICHIA COLI      Studies: DG CHEST PORT 1 VIEW  Result Date: 10/10/2020 CLINICAL DATA:  Central line placement. EXAM: PORTABLE CHEST 1 VIEW COMPARISON:  10/05/2020 FINDINGS: Left IJ dialysis catheter unchanged with tip over the right atrium. Lungs are adequately inflated with stable hazy opacification over the right mid to lower lung and left base suggesting layering effusions right worse than left with associated bibasilar atelectasis. Mild stable cardiomegaly. Remainder of the exam is unchanged. IMPRESSION: 1. Stable bilateral pleural effusions right greater than left with associated bibasilar atelectasis. 2. Left IJ dialysis catheter unchanged with tip over the right atrium. Electronically Signed   By: Marin Olp M.D.   On: 10/10/2020 08:38    Scheduled Meds: . carvedilol  25 mg Oral BID WC  . Chlorhexidine Gluconate Cloth  6 each Topical Q0600  . ferrous sulfate  325 mg Oral BID WC  . hydrALAZINE  50 mg Oral Q8H  . hydrocortisone cream   Topical BID  . isosorbide mononitrate  30 mg Oral Daily  . nutrition supplement (JUVEN)  1 packet Oral BID BM  . potassium chloride SA  10 mEq Oral BID  . potassium chloride  40 mEq Oral Once  . sodium chloride flush  3 mL Intravenous Q12H  . torsemide  100 mg Oral Daily    Continuous Infusions: . meropenem (MERREM) IV 500 mg (10/09/20 1707)     LOS: 15 days     Kayleen Memos, MD [facility] Pager  [phone]  If 7PM-7AM, please contact night-coverage [email or web address] Password [facility] 10/10/2020, 3:04 PM

## 2020-10-10 NOTE — Progress Notes (Signed)
Postable CXR done ,requested stat reading ,   pt  awaiting placement  result for  HD procedure.  Hemodialysis aware and /updated.Vilinda Boehringer to incoming RN Marya Amsler for follow up.Marland Kitchen

## 2020-10-11 DIAGNOSIS — I5043 Acute on chronic combined systolic (congestive) and diastolic (congestive) heart failure: Secondary | ICD-10-CM | POA: Diagnosis not present

## 2020-10-11 LAB — COMPREHENSIVE METABOLIC PANEL
ALT: 10 U/L (ref 0–44)
AST: 13 U/L — ABNORMAL LOW (ref 15–41)
Albumin: 2.7 g/dL — ABNORMAL LOW (ref 3.5–5.0)
Alkaline Phosphatase: 64 U/L (ref 38–126)
Anion gap: 9 (ref 5–15)
BUN: 57 mg/dL — ABNORMAL HIGH (ref 8–23)
CO2: 26 mmol/L (ref 22–32)
Calcium: 8.5 mg/dL — ABNORMAL LOW (ref 8.9–10.3)
Chloride: 104 mmol/L (ref 98–111)
Creatinine, Ser: 2.85 mg/dL — ABNORMAL HIGH (ref 0.44–1.00)
GFR, Estimated: 18 mL/min — ABNORMAL LOW (ref >60)
Glucose, Bld: 83 mg/dL (ref 70–99)
Potassium: 3.3 mmol/L — ABNORMAL LOW (ref 3.5–5.1)
Sodium: 139 mmol/L (ref 135–145)
Total Bilirubin: 0.8 mg/dL (ref 0.3–1.2)
Total Protein: 6.9 g/dL (ref 6.5–8.1)

## 2020-10-11 LAB — CBC
HCT: 33.2 % — ABNORMAL LOW (ref 36.0–46.0)
Hemoglobin: 10.3 g/dL — ABNORMAL LOW (ref 12.0–15.0)
MCH: 28.4 pg (ref 26.0–34.0)
MCHC: 31 g/dL (ref 30.0–36.0)
MCV: 91.5 fL (ref 80.0–100.0)
Platelets: 64 10*3/uL — ABNORMAL LOW (ref 150–400)
RBC: 3.63 MIL/uL — ABNORMAL LOW (ref 3.87–5.11)
RDW: 18.5 % — ABNORMAL HIGH (ref 11.5–15.5)
WBC: 3 10*3/uL — ABNORMAL LOW (ref 4.0–10.5)
nRBC: 1 % — ABNORMAL HIGH (ref 0.0–0.2)

## 2020-10-11 LAB — GLUCOSE, CAPILLARY: Glucose-Capillary: 116 mg/dL — ABNORMAL HIGH (ref 70–99)

## 2020-10-11 NOTE — Progress Notes (Signed)
Left IJ s/p dialysis catheter removal site clean dry and intact. Pressure drsg guaze and tape removed. No s/s drainage or complication at site. Sterile bandaid applied.

## 2020-10-11 NOTE — Progress Notes (Signed)
PROGRESS NOTE  Vanessa [name] NKN:[id number] DOB: [birth year] DOA: 09/24/2020 PCP: Patient, No Pcp Per (Inactive)  HPI/Recap of past 24 hours: Patient is a 65 years old female with past medical history of chronic combined systolic and diastolic CHF(EF 35 to 37% by TTE 08/08/20), atrial fibrillation not on anticoagulation due to chronic bleeding from endometrial cancer, CKD stage IV, type II DM, hypertension, anemia of chronic disease, history of MSSA bacteremia, stage I endometrial cancer status post palliative radiation presented to the hospital from skilled nursing facility with complaints of shortness of breath and peripheral edema.Patient was recently admitted from 08/07/2020-08/18/2020 for acute on chronic combined systolic and diastolic CHF exacerbation, AKI on CKD stage IV, and new onset A. fib with RVR. Patient was admitted for acute on chronic CHF exacerbation,  started on diuresis.  Cardiology and nephrology has been consulted.  Patient continues to remain fluid overloaded.  Patient was started on metolazone, Patient is having cardiorenal syndrome,  Nephrology is planning short-term dialysis trial.  10/11/20: Patient was seen at her bedside.  She feels she is not ready for hospice.  She states she wants to return home and take care of her grandchildren.  Assessment/Plan: Principal Problem:   Acute on chronic combined systolic (congestive) and diastolic (congestive) heart failure (HCC) Active Problems:   HTN (hypertension)   Thrombocytopenia (HCC)   Acute kidney injury superimposed on CKD ([place])   Acute right-sided heart failure (HCC)  Acute on chronic combined systolic and diastolic heart failure  Patient presented with progressive peripheral edema orthopnea dyspnea on exertion with elevated BNP.  2D echocardiogram on 1/22 showed ejection fraction of 35 to 40%. on IV Lasix twice a day.  On Coreg as well.  Not on ACE inhibitor's ARB or spironolactone due to renal dysfunction.  Patient  continued to remain fluid overloaded despite IV diuretics.  Seen by nephrology, signed off.  Temporary hemodialysis catheter removed on 10/10/2020 per nephrology.  Refractory Hypokalemia  Potassium 3.3  On oral potassium supplement.   Closely monitor.  Worsening acute kidney injury superimposed on CKD stage IV  Likely exacerbated by acute on chronic congestive heart failure.   Patient presented with creatinine of 4.3.   Creatinine uptrending 2.89 from 2.77 Nephrology signed off on 10/10/20, temporary hemodialysis catheter removed per nephrology.  History of endometrial cancer with chronic abnormal uterine bleeding Anemia due to chronic blood loss and CKD stage IV Hemoglobin stable at 10.3 No overt bleeding. Continue to monitor  Paroxysmal atrial fibrillation Currently rate controlled on Coreg. Not on anticoagulation due to risk of bleeding. Continue to monitor on telemetry.  Essential hypertension BP stable. On Coreg, hydralazine, Imdur.  Continue IV Lasix.   Continue to monitor blood pressure.  Thrombocytopenia No active bleeding.   Platelet count is uptrending 64 from 51  ESBL Ecoli UTI:  Analysis was consistent with UTI which grew ESBL E. Coli.  Currently on antibiotic with meropenem for ESBL.   Morbid obesity BMI 41   DVT prophylaxis:  Sequential compression device  Code Status:DNR  Family Communication:  None at bedside.  Disposition Plan:   Status is: Inpatient  Remains inpatient appropriate because:Inpatient level of care appropriate due to severity of illness  Dispo: The patient is from: Home  Anticipated d/c is to: Home with home health services which is patient's own preference.  Patient currently is  Not medically stable for dc              Difficult to place patient No  Consultants:  Nephrology  Procedures:  Left internal jugular temporary hemodialysis catheter placement  Antimicrobials:              Anti-infectives (From admission, onward)     Start     Dose/Rate Route Frequency Ordered Stop   10/06/20 1800  meropenem (MERREM) 500 mg in sodium chloride 0.9 % 100 mL IVPB        500 mg 200 mL/hr over 30 Minutes Intravenous Every 24 hours 10/06/20 1704     10/06/20 1200  fosfomycin (MONUROL) packet 3 g        3 g Oral  Once 10/06/20 1109 10/06/20 1200   10/03/20 0930  cefTRIAXone (ROCEPHIN) 1 g in sodium chloride 0.9 % 100 mL IVPB  Status:  Discontinued        1 g 200 mL/hr over 30 Minutes Intravenous Every 24 hours 10/03/20 0832 10/06/20 1109            Objective: Vitals:   10/11/20 0600 10/11/20 0918 10/11/20 1429 10/11/20 1619  BP:  (!) 128/95 (!) 115/91 (!) 143/88  Pulse:  (!) 105  62  Resp: 15 14 15 14   Temp:      TempSrc:    Axillary  SpO2:  100%    Weight:      Height:        Intake/Output Summary (Last 24 hours) at 10/11/2020 1718 Last data filed at 10/11/2020 0400 Gross per 24 hour  Intake 560 ml  Output 575 ml  Net -15 ml   Filed Weights   10/08/20 1500 10/09/20 0632 10/11/20 0407  Weight: 94.2 kg 93.4 kg 93.7 kg    Exam:  . General: 65 y.o. year-old female chronically ill-appearing no acute stress.  She is alert oriented x3. . Cardiovascular: Regular rate and rhythm no rubs or gallops.  No thyromegaly noted.   Marland Kitchen Respiratory: Clear to auscultation no wheezes or rales.   . Abdomen: Obese nontender bowel sounds present.   . Musculoskeletal: Anasarca, edema all throughout all the way to the abdomen.. . Skin: No ulcerative lesions noted.   Marland Kitchen Psychiatry: Mood is appropriate for condition and setting.   Data Reviewed: CBC: Recent Labs  Lab 10/06/20 0252 10/07/20 0412 10/08/20 0237 10/09/20 0631 10/11/20 0743  WBC 2.9* 3.2* 3.0* 3.3* 3.0*  HGB 10.7* 10.1* 10.8* 10.5* 10.3*  HCT 33.2* 31.0* 34.5* 33.4* 33.2*  MCV 89.5 88.6 90.6 90.0 91.5  PLT 79* 61* 58* 51* 64*   Basic Metabolic Panel: Recent Labs  Lab 10/05/20 1221 10/06/20 0252  10/07/20 0412 10/08/20 0237 10/09/20 0631 10/10/20 0856 10/11/20 0743  NA 141   < > 140 141 140 135 139  K 3.7   < > 3.5 3.5 3.1* 2.8* 3.3*  CL 104   < > 101 105 101 100 104  CO2 25   < > 29 27 30 25 26   GLUCOSE 103*   < > 88 68* 72 158* 83  BUN 85*   < > 58* 61* 46* 53* 57*  CREATININE 3.26*   < > 2.56* 2.69* 2.35* 2.77* 2.85*  CALCIUM 8.6*   < > 8.4* 8.5* 8.4* 8.0* 8.5*  MG  --   --  2.0  --  1.9  --   --   PHOS 4.5  --  3.7  --  3.4 3.6  --    < > = values in this interval not displayed.   GFR: Estimated Creatinine Clearance: 19.4 mL/min (A) (by C-G formula based on  SCr of 2.85 mg/dL (H)). Liver Function Tests: Recent Labs  Lab 10/05/20 1221 10/10/20 0856 10/11/20 0743  AST  --   --  13*  ALT  --   --  10  ALKPHOS  --   --  64  BILITOT  --   --  0.8  PROT  --   --  6.9  ALBUMIN 2.7* 2.6* 2.7*   No results for input(s): LIPASE, AMYLASE in the last 168 hours. No results for input(s): AMMONIA in the last 168 hours. Coagulation Profile: No results for input(s): INR, PROTIME in the last 168 hours. Cardiac Enzymes: No results for input(s): CKTOTAL, CKMB, CKMBINDEX, TROPONINI in the last 168 hours. BNP (last 3 results) No results for input(s): PROBNP in the last 8760 hours. HbA1C: No results for input(s): HGBA1C in the last 72 hours. CBG: Recent Labs  Lab 10/06/20 1745 10/11/20 1618  GLUCAP 93 116*   Lipid Profile: No results for input(s): CHOL, HDL, LDLCALC, TRIG, CHOLHDL, LDLDIRECT in the last 72 hours. Thyroid Function Tests: No results for input(s): TSH, T4TOTAL, FREET4, T3FREE, THYROIDAB in the last 72 hours. Anemia Panel: No results for input(s): VITAMINB12, FOLATE, FERRITIN, TIBC, IRON, RETICCTPCT in the last 72 hours. Urine analysis:    Component Value Date/Time   COLORURINE YELLOW 10/02/2020 1719   APPEARANCEUR TURBID (A) 10/02/2020 1719   LABSPEC 1.012 10/02/2020 1719   PHURINE 5.0 10/02/2020 1719   GLUCOSEU NEGATIVE 10/02/2020 1719   HGBUR  MODERATE (A) 10/02/2020 1719   BILIRUBINUR NEGATIVE 10/02/2020 1719   KETONESUR NEGATIVE 10/02/2020 1719   PROTEINUR 100 (A) 10/02/2020 1719   UROBILINOGEN 0.2 03/06/2014 1644   NITRITE NEGATIVE 10/02/2020 1719   LEUKOCYTESUR LARGE (A) 10/02/2020 1719   Sepsis Labs: @LABRCNTIP (procalcitonin:4,lacticidven:4)  ) Recent Results (from the past 240 hour(s))  Culture, Urine     Status: Abnormal   Collection Time: 10/03/20  6:00 PM   Specimen: Urine, Random  Result Value Ref Range Status   Specimen Description URINE, RANDOM  Final   Special Requests   Final    NONE Performed at [facility], [place] [address], [place], [place] [address]    Culture (A)  Final    70,000 COLONIES/mL ESCHERICHIA COLI Confirmed Extended Spectrum Beta-Lactamase Producer (ESBL).  In bloodstream infections from ESBL organisms, carbapenems are preferred over piperacillin/tazobactam. They are shown to have a lower risk of mortality.    Report Status 10/06/2020 FINAL  Final   Organism ID, Bacteria ESCHERICHIA COLI (A)  Final      Susceptibility   Escherichia coli - MIC*    AMPICILLIN >=32 RESISTANT Resistant     CEFAZOLIN >=64 RESISTANT Resistant     CEFEPIME 0.5 SENSITIVE Sensitive     CEFTRIAXONE 32 RESISTANT Resistant     CIPROFLOXACIN 1 SENSITIVE Sensitive     GENTAMICIN >=16 RESISTANT Resistant     IMIPENEM <=0.25 SENSITIVE Sensitive     NITROFURANTOIN 64 INTERMEDIATE Intermediate     TRIMETH/SULFA <=20 SENSITIVE Sensitive     AMPICILLIN/SULBACTAM 8 SENSITIVE Sensitive     PIP/TAZO <=4 SENSITIVE Sensitive     * 70,000 COLONIES/mL ESCHERICHIA COLI      Studies: No results found.  Scheduled Meds: . carvedilol  25 mg Oral BID WC  . Chlorhexidine Gluconate Cloth  6 each Topical Q0600  . ferrous sulfate  325 mg Oral BID WC  . hydrALAZINE  50 mg Oral Q8H  . hydrocortisone cream   Topical BID  . isosorbide mononitrate  30  mg Oral Daily  . nutrition supplement (JUVEN)  1 packet Oral BID BM  .  potassium chloride SA  10 mEq Oral BID  . sodium chloride flush  3 mL Intravenous Q12H  . torsemide  100 mg Oral Daily    Continuous Infusions: . meropenem (MERREM) IV Stopped (10/10/20 1917)     LOS: 16 days     Kayleen Memos, MD [facility] Pager [phone]  If 7PM-7AM, please contact night-coverage [email or web address] Password [facility] 10/11/2020, 5:18 PM

## 2020-10-11 NOTE — Plan of Care (Signed)
  Problem: Activity: Goal: Capacity to carry out activities will improve Outcome: Progressing   Problem: Nutrition: Goal: Adequate nutrition will be maintained Outcome: Progressing   Problem: Coping: Goal: Level of anxiety will decrease Outcome: Progressing

## 2020-10-12 ENCOUNTER — Inpatient Hospital Stay (HOSPITAL_COMMUNITY): Payer: Medicare Other

## 2020-10-12 DIAGNOSIS — I10 Essential (primary) hypertension: Secondary | ICD-10-CM | POA: Diagnosis not present

## 2020-10-12 DIAGNOSIS — N179 Acute kidney failure, unspecified: Secondary | ICD-10-CM | POA: Diagnosis not present

## 2020-10-12 DIAGNOSIS — I5043 Acute on chronic combined systolic (congestive) and diastolic (congestive) heart failure: Secondary | ICD-10-CM | POA: Diagnosis not present

## 2020-10-12 DIAGNOSIS — C541 Malignant neoplasm of endometrium: Secondary | ICD-10-CM | POA: Diagnosis not present

## 2020-10-12 DIAGNOSIS — Z66 Do not resuscitate: Secondary | ICD-10-CM

## 2020-10-12 LAB — RENAL FUNCTION PANEL
Albumin: 2.7 g/dL — ABNORMAL LOW (ref 3.5–5.0)
Anion gap: 12 (ref 5–15)
BUN: 65 mg/dL — ABNORMAL HIGH (ref 8–23)
CO2: 27 mmol/L (ref 22–32)
Calcium: 8.8 mg/dL — ABNORMAL LOW (ref 8.9–10.3)
Chloride: 103 mmol/L (ref 98–111)
Creatinine, Ser: 2.93 mg/dL — ABNORMAL HIGH (ref 0.44–1.00)
GFR, Estimated: 17 mL/min — ABNORMAL LOW (ref >60)
Glucose, Bld: 83 mg/dL (ref 70–99)
Phosphorus: 4.2 mg/dL (ref 2.5–4.6)
Potassium: 3.3 mmol/L — ABNORMAL LOW (ref 3.5–5.1)
Sodium: 142 mmol/L (ref 135–145)

## 2020-10-12 LAB — CBC
HCT: 32.6 % — ABNORMAL LOW (ref 36.0–46.0)
Hemoglobin: 10.2 g/dL — ABNORMAL LOW (ref 12.0–15.0)
MCH: 28.7 pg (ref 26.0–34.0)
MCHC: 31.3 g/dL (ref 30.0–36.0)
MCV: 91.6 fL (ref 80.0–100.0)
Platelets: 68 10*3/uL — ABNORMAL LOW (ref 150–400)
RBC: 3.56 MIL/uL — ABNORMAL LOW (ref 3.87–5.11)
RDW: 18.6 % — ABNORMAL HIGH (ref 11.5–15.5)
WBC: 3.2 10*3/uL — ABNORMAL LOW (ref 4.0–10.5)
nRBC: 0.6 % — ABNORMAL HIGH (ref 0.0–0.2)

## 2020-10-12 LAB — HEMOGLOBIN A1C
Hgb A1c MFr Bld: 5.5 % (ref 4.8–5.6)
Mean Plasma Glucose: 111.15 mg/dL

## 2020-10-12 MED ORDER — ALBUMIN HUMAN 25 % IV SOLN
12.5000 g | Freq: Once | INTRAVENOUS | Status: AC
  Administered 2020-10-12: 12.5 g via INTRAVENOUS
  Filled 2020-10-12: qty 50

## 2020-10-12 MED ORDER — POTASSIUM CHLORIDE CRYS ER 20 MEQ PO TBCR
40.0000 meq | EXTENDED_RELEASE_TABLET | Freq: Once | ORAL | Status: AC
  Administered 2020-10-12: 40 meq via ORAL
  Filled 2020-10-12: qty 2

## 2020-10-12 MED ORDER — FUROSEMIDE 10 MG/ML IJ SOLN
160.0000 mg | Freq: Four times a day (QID) | INTRAVENOUS | Status: DC
  Administered 2020-10-12 – 2020-10-14 (×7): 160 mg via INTRAVENOUS
  Filled 2020-10-12: qty 16
  Filled 2020-10-12: qty 14
  Filled 2020-10-12: qty 4
  Filled 2020-10-12: qty 10
  Filled 2020-10-12: qty 16
  Filled 2020-10-12: qty 10
  Filled 2020-10-12 (×3): qty 16

## 2020-10-12 MED ORDER — IOHEXOL 300 MG/ML  SOLN
100.0000 mL | Freq: Once | INTRAMUSCULAR | Status: AC | PRN
  Administered 2020-10-12: 100 mL via INTRAVENOUS

## 2020-10-12 MED ORDER — HEPARIN SODIUM (PORCINE) 5000 UNIT/ML IJ SOLN
5000.0000 [IU] | Freq: Three times a day (TID) | INTRAMUSCULAR | Status: DC
  Administered 2020-10-12 – 2020-10-14 (×6): 5000 [IU] via SUBCUTANEOUS
  Filled 2020-10-12 (×6): qty 1

## 2020-10-12 NOTE — Progress Notes (Signed)
Received a call from radiology on 10/12/20 around 3:50PM.  Patient has received IV contrast unintentionally on non contrast CT chest/A/P ordered on 10/12/20.  Per Legrand Como from radiology, the order will be changed to "IV contrast" from "non contrast", since the patient has already received it, so it can be read by the radiologist.

## 2020-10-12 NOTE — Progress Notes (Signed)
   10/12/20 1200  Mobility  Activity Refused mobility (Already sitting in recliner; declined for unspecified reasons)

## 2020-10-12 NOTE — Progress Notes (Signed)
The patient was informed on 10/12/20 that she received IV contrast unintentionally.  Called her daughter Vernie Shanks x 3 times to inform of the same but no answer.    Nephrology, Dr. Marval Regal, has been re-consulted to assist with the management.

## 2020-10-12 NOTE — Progress Notes (Addendum)
Patient ID: Vanessa [name], female   DOB: [birth year], 64 y.o.   MRN: [id number]  HPI:  Vanessa [name] has a PMH significant for endometrial cancer stage I s/p palliative radiation (not operable), chronic combined systolic and diastolic CHF (EF 85-63%), atrial fibrillation not on anticoagulation due to high risk of bleeding, CKD stage IV, HTN, DM type 2, and anemia of chronic disease who presented to [facility] ED on 09/25/20 with recurrent acute on chronic combined CHF as well as AKI/CKD stage IV (had similar episode 1/28-08/18/20).  She was seen in consultation on 10/02/20 by our practice and underwent a trial of dialysis from 3/28, 3/29, and again on 10/08/20 without significant improvement of renal function.  She did not want to continue with dialysis so temporary HD catheter was removed 10/10/20.  Palliative care was consulted and hospice referral was recommended, we therefore signed off.  We were asked to see her again because she accidentally received IV contrast with a CT scan of chest and abdomen to assess for metastasis of her endometrial cancer.  She understands that she received IV contrast but reports that she has been urinating but they are not documenting it.  She also doesn't have a good insight to her multiple medical issues. She again states that she does not want any more dialysis.    Currently she feels "fine", no SOB, chest pain, but has not been moving around. O:BP (!) 138/102   Pulse (!) 103   Temp (!) 97.5 F (36.4 C) (Oral)   Resp 13   Ht 4\' 11"  (1.499 m)   Wt 94.3 kg   LMP 03/17/2015   SpO2 100%   BMI 41.99 kg/m   Intake/Output Summary (Last 24 hours) at 10/12/2020 1917 Last data filed at 10/12/2020 0800 Gross per 24 hour  Intake 240 ml  Output --  Net 240 ml   Intake/Output: I/O last 3 completed shifts: In: 240 [P.O.:240] Out: -   Intake/Output this shift:  No intake/output data recorded. Weight change: 0.635 kg Gen: NAD CVS: IRR IRR Resp: CTA Abd: distended, +fluid wave,  nontender Ext: 2+ anasarca to thighs  Recent Labs  Lab 10/06/20 0252 10/07/20 0412 10/08/20 0237 10/09/20 0631 10/10/20 0856 10/11/20 0743 10/12/20 0844  NA 142 140 141 140 135 139 142  K 3.6 3.5 3.5 3.1* 2.8* 3.3* 3.3*  CL 105 101 105 101 100 104 103  CO2 27 29 27 30 25 26 27   GLUCOSE 116* 88 68* 72 158* 83 83  BUN 90* 58* 61* 46* 53* 57* 65*  CREATININE 3.47* 2.56* 2.69* 2.35* 2.77* 2.85* 2.93*  ALBUMIN  --   --   --   --  2.6* 2.7* 2.7*  CALCIUM 8.4* 8.4* 8.5* 8.4* 8.0* 8.5* 8.8*  PHOS  --  3.7  --  3.4 3.6  --  4.2  AST  --   --   --   --   --  13*  --   ALT  --   --   --   --   --  10  --    Liver Function Tests: Recent Labs  Lab 10/10/20 0856 10/11/20 0743 10/12/20 0844  AST  --  13*  --   ALT  --  10  --   ALKPHOS  --  64  --   BILITOT  --  0.8  --   PROT  --  6.9  --   ALBUMIN 2.6* 2.7* 2.7*   No results for input(s):  LIPASE, AMYLASE in the last 168 hours. No results for input(s): AMMONIA in the last 168 hours. CBC: Recent Labs  Lab 10/07/20 0412 10/08/20 0237 10/09/20 0631 10/11/20 0743 10/12/20 0844  WBC 3.2* 3.0* 3.3* 3.0* 3.2*  HGB 10.1* 10.8* 10.5* 10.3* 10.2*  HCT 31.0* 34.5* 33.4* 33.2* 32.6*  MCV 88.6 90.6 90.0 91.5 91.6  PLT 61* 58* 51* 64* 68*   Cardiac Enzymes: No results for input(s): CKTOTAL, CKMB, CKMBINDEX, TROPONINI in the last 168 hours. CBG: Recent Labs  Lab 10/06/20 1745 10/11/20 1618  GLUCAP 93 116*    Iron Studies: No results for input(s): IRON, TIBC, TRANSFERRIN, FERRITIN in the last 72 hours. Studies/Results: CT CHEST ABDOMEN PELVIS W CONTRAST  Result Date: 10/12/2020 CLINICAL DATA:  Endometrial cancer staging EXAM: CT CHEST, ABDOMEN, AND PELVIS WITH CONTRAST TECHNIQUE: Multidetector CT imaging of the chest, abdomen and pelvis was performed following the standard protocol during bolus administration of intravenous contrast. CONTRAST:  143mL OMNIPAQUE IOHEXOL 300 MG/ML  SOLN COMPARISON:  11/07/2019 FINDINGS: CT CHEST  FINDINGS Cardiovascular: Scattered aortic atherosclerosis. Cardiomegaly. No pericardial effusion. Mediastinum/Nodes: No enlarged mediastinal, hilar, or axillary lymph nodes. Thyroid gland, trachea, and esophagus demonstrate no significant findings. Lungs/Pleura: Moderate right, small left pleural effusions and associated atelectasis or consolidation. Pulmonary nodule of the anterior inferior right upper lobe measuring 2.1 x 1.6 cm (series 4, image 76). Pulmonary nodule of the superior segment left lower lobe measuring 1.7 x 1.6 cm (series 4, image 78). Musculoskeletal: No chest wall mass or suspicious bone lesions identified. CT ABDOMEN PELVIS FINDINGS Hepatobiliary: Heterogeneous contrast enhancement of the liver parenchyma. No focal liver lesion. No gallstones, gallbladder wall thickening, or biliary dilatation. Pancreas: Unremarkable. No pancreatic ductal dilatation or surrounding inflammatory changes. Spleen: Normal in size without significant abnormality. Adrenals/Urinary Tract: Adrenal glands are unremarkable. Markedly atrophic left kidney. The right kidney is normal in appearance. No hydronephrosis. Bladder is unremarkable. Stomach/Bowel: Stomach is within normal limits. Appendix appears normal. No evidence of bowel wall thickening, distention, or inflammatory changes. Vascular/Lymphatic: No significant vascular findings are present. No enlarged abdominal or pelvic lymph nodes. Reproductive: No mass or other abnormality. Other: Severe anasarca. Large volume ascites throughout the abdomen and pelvis. Musculoskeletal: No acute or significant osseous findings. IMPRESSION: 1. Pulmonary nodules of the anterior inferior right upper lobe and superior segment left lower lobe measuring up to 2.1 cm. These are highly concerning for pulmonary metastatic disease. 2. Moderate right, small left pleural effusions and associated atelectasis or consolidation. 3. Large volume ascites throughout the abdomen and pelvis without  specific evidence of metastatic disease. No significant lymphadenopathy or peritoneal nodularity appreciated. 4. No pelvic mass appreciated. 5. Heterogeneous contrast enhancement of the liver parenchyma, suggesting passive hepatic congestion and/or chronic liver disease. 6. Markedly atrophic left kidney. 7. Severe anasarca. Aortic Atherosclerosis (ICD10-I70.0). Electronically Signed   By: Eddie Candle M.D.   On: 10/12/2020 15:58   . carvedilol  25 mg Oral BID WC  . Chlorhexidine Gluconate Cloth  6 each Topical Q0600  . ferrous sulfate  325 mg Oral BID WC  . heparin injection (subcutaneous)  5,000 Units Subcutaneous Q8H  . hydrALAZINE  50 mg Oral Q8H  . hydrocortisone cream   Topical BID  . isosorbide mononitrate  30 mg Oral Daily  . nutrition supplement (JUVEN)  1 packet Oral BID BM  . potassium chloride SA  10 mEq Oral BID  . sodium chloride flush  3 mL Intravenous Q12H  . torsemide  100 mg Oral Daily  BMET    Component Value Date/Time   NA 142 10/12/2020 0844   NA 146 12/10/2019 0000   K 3.3 (L) 10/12/2020 0844   CL 103 10/12/2020 0844   CO2 27 10/12/2020 0844   GLUCOSE 83 10/12/2020 0844   BUN 65 (H) 10/12/2020 0844   BUN 32 (A) 12/10/2019 0000   CREATININE 2.93 (H) 10/12/2020 0844   CREATININE 1.21 (H) 08/15/2016 1656   CALCIUM 8.8 (L) 10/12/2020 0844   GFRNONAA 17 (L) 10/12/2020 0844   GFRNONAA 48 (L) 08/15/2016 1656   GFRAA 30 (L) 03/18/2020 1254   GFRAA 56 (L) 08/15/2016 1656   CBC    Component Value Date/Time   WBC 3.2 (L) 10/12/2020 0844   RBC 3.56 (L) 10/12/2020 0844   HGB 10.2 (L) 10/12/2020 0844   HCT 32.6 (L) 10/12/2020 0844   HCT 29.5 (L) 11/06/2019 2140   PLT 68 (L) 10/12/2020 0844   MCV 91.6 10/12/2020 0844   MCH 28.7 10/12/2020 0844   MCHC 31.3 10/12/2020 0844   RDW 18.6 (H) 10/12/2020 0844   LYMPHSABS 0.7 08/07/2020 1736   MONOABS 0.1 08/07/2020 1736   EOSABS 0.0 08/07/2020 1736   BASOSABS 0.1 08/07/2020 1736     Assessment/Plan:  1. AKI/CKD  stage IV - presumably due to cardiorenal syndrome, now with IV contrast exposure today.  She reports that she is urinating but not being recorded.  She remains volume overloaded and no weights or I's/O's documented today.  She does not want any more dialysis and is willing to try IV lasix again.  Palliative care is following.  Nothing can be done about the IV contrast exposure which was unfortunate.  We will continue to follow UOP and daily BUN/cr.  Ongoing discussions of [place].  Agree that she is a poor candidate for long term dialysis.  2. Acute on chronic combined systolic and diastolic CHF - Cardiology has signed off as she was not felt to be a good candidate for long term advanced HF therapies.  She has not responded to po torsemide and she does not want more dialysis, so will resume IV lasix and follow response.  Will dose with IV albumin prior to IV lasix.  May need to resume metolazone as well.  3. H/o atrial flutter with paroxysmal atrial tachycardia - not on anticoagulation due to increased risk of bleeding. 4. HTN - was hypotensive with RVR, now improved with rate control  5. Endometrial cancer - stage I, not operable and received palliative XRT.  CT scan of chest and abdomen with suspicious pulmonary nodules of the anterior inferior RUL and superior segment of LLL.  Unclear what her ultimate prognosis will be and Oncology had been contacted earlier today. 6. Ascites - unclear if this is from CHF or endometrial cancer. 7. Disposition - poor overall prognosis from multiple, end stage disease processes.  Palliative care is involved and will be meeting with her again tomorrow.  I am concerned that she has poor medical insight into her multiple medical issues.  If she does not respond to IV lasix and her renal function continues to deteriorate, would recommend home with hospice.  Donetta Potts, MD [employer] [phone]

## 2020-10-12 NOTE — Care Management Important Message (Signed)
Important Message  Patient Details  Name: Vanessa [name] MRN: [id number] Date of Birth: [birth year]   Medicare Important Message Given:  Yes     Shelda Altes 10/12/2020, 10:43 AM

## 2020-10-12 NOTE — Progress Notes (Signed)
PROGRESS NOTE  Vanessa [name] XQJ:[id number] DOB: 05-Jul-1956 DOA: 09/24/2020 PCP: Patient, No Pcp Per (Inactive)  HPI/Recap of past 24 hours: Patient is a 65 years old female with past medical history of chronic combined systolic and diastolic CHF(EF 35 to 44% by TTE 08/08/20), atrial fibrillation not on anticoagulation due to chronic bleeding from endometrial cancer, CKD stage IV, type II DM, hypertension, anemia of chronic disease, history of MSSA bacteremia, stage I endometrial cancer status post palliative radiation presented to the hospital from skilled nursing facility with complaints of shortness of breath and peripheral edema.  Patient was recently admitted from 08/07/2020-08/18/2020 for acute on chronic combined systolic and diastolic CHF exacerbation, AKI on CKD stage IV, and new onset A. fib with RVR.  She was started on diuresis.  Cardiology and nephrology were consulted.  Patient continues to remain fluid overloaded and renal function is worsening.  There is concern for cardiorenal syndrome.  Seen by Nephrology.  Per nephrology, the patient is not a candidate for hemodialysis.  Nephrology has recommended hospice care.  Palliative care team consulted to assist with establishing goals of care.  Discussed with Dr. Berline Lopes the patient's GYN oncologist on 10/12/2020 about the patient's prognosis with endometrial cancer.  CT chest abdomen and pelvis ordered to assess for any metastasis.  If her endometrial cancer is left untreated with no metastatic disease, she would probably have months to years to live if her other comorbidities are stabilized.  Dr. Berline Lopes will be out of the office until Thursday if the patient is discharged prior to that, Melissa cross NP can be reached via secure chat for follow-up appointment in the office.  10/12/20: Patient was seen and examined at her bedside.  There were no acute events overnight.  She has no new complaints.  She denies having any chest pain, shortness of breath  or palpitations.  Volume overloaded on exam.  Assessment/Plan: Principal Problem:   Acute on chronic combined systolic (congestive) and diastolic (congestive) heart failure (HCC) Active Problems:   HTN (hypertension)   Thrombocytopenia (HCC)   Acute kidney injury superimposed on CKD ([place])   Acute right-sided heart failure (HCC)  Acute on chronic combined systolic and diastolic heart failure  Patient presented with progressive peripheral edema orthopnea dyspnea on exertion with elevated BNP.   2D echocardiogram on 1/22 showed ejection fraction of 35 to 40%. Initially on IV Lasix, this was stopped and she was switched to torsemide 100 mg daily.  Personally reviewed chest x-ray done on 10/10/2020 which showed increased urinary vascularity suggestive of edema as well as bilateral pleural effusions. Seen by nephrology, signed off.  Temporary hemodialysis catheter removed on 10/10/2020.  Per nephrology patient is not a candidate for hemodialysis.  Recommended hospice care.  Palliative care team following. Strict I's and O's and daily weight. Monitor renal function while on diuretics.  ESBL Ecoli UTI:  Urine culture grew greater than 100,000 colonies of ESBL E. Coli. Day #7 of 7 of meropenem. Contact precautions in place.  Refractory Hypokalemia in the setting of diuretics. Potassium 3.3  from 3.3. On oral potassium supplement scheduled while on torsemide. 1 dose KCl 40 mEq ordered. Potassium replaced judiciously due to AKI Repeat renal panel in the morning.  Worsening acute kidney injury superimposed on CKD stage IV  Likely exacerbated by acute on chronic combined congestive heart failure.   Patient presented with creatinine of 4.3.   Creatinine uptrending 2.93 from 2.89 from 2.77 Nephrology signed off on 10/10/20, temporary hemodialysis catheter removed  per nephrology. Per nephrology patient is not a candidate for hemodialysis.  History of endometrial cancer Discussed with Dr. Berline Lopes  the patient's GYN oncologist on 10/12/2020 about the patient's prognosis with endometrial cancer.  CT chest abdomen and pelvis ordered to assess for any metastasis.  If her endometrial cancer is left untreated with no metastatic disease, she would probably have months to years to live if her other comorbidities are stabilized.  Dr. Berline Lopes will be out of the office until Thursday if the patient is discharged prior to that, Melissa cross NP can be reached via secure chat for follow-up appointment in the office.  Paroxysmal atrial fibrillation Rate controlled on Coreg. Not on anticoagulation due to risk of bleeding. Continue to monitor on telemetry.  Essential hypertension BP stable. On Coreg, hydralazine, Imdur, torsemide Continue to closely monitor vital signs.  Acute thrombocytopenia No active bleeding.   Platelet count is uptrending 68 from 64 from 51    Morbid obesity BMI 41   DVT prophylaxis:  Subcu heparin 3 times daily as platelet count is greater than 50,000 and now stable.  Code Status:DNR  Family Communication:  Updated her daughter via phone.  Disposition Plan:   Status is: Inpatient  Remains inpatient appropriate because:Inpatient level of care appropriate due to severity of illness  Dispo: The patient is from: SNF  Anticipated d/c is to: Home with home health services versus home with hospice.  Patient currently is  Not medically stable for dc              Difficult to place patient No  Consultants:   Nephrology  Procedures:  Left internal jugular temporary hemodialysis catheter placement  Antimicrobials:             Anti-infectives (From admission, onward)     Start     Dose/Rate Route Frequency Ordered Stop   10/06/20 1800  meropenem (MERREM) 500 mg in sodium chloride 0.9 % 100 mL IVPB        500 mg 200 mL/hr over 30 Minutes Intravenous Every 24 hours 10/06/20 1704     10/06/20 1200  fosfomycin (MONUROL)  packet 3 g        3 g Oral  Once 10/06/20 1109 10/06/20 1200   10/03/20 0930  cefTRIAXone (ROCEPHIN) 1 g in sodium chloride 0.9 % 100 mL IVPB  Status:  Discontinued        1 g 200 mL/hr over 30 Minutes Intravenous Every 24 hours 10/03/20 0832 10/06/20 1109            Objective: Vitals:   10/12/20 0102 10/12/20 0436 10/12/20 0842 10/12/20 1232  BP:  124/76 125/89 111/88  Pulse:  (!) 102 (!) 106 (!) 105  Resp:  14 18 17   Temp:  (!) 97.1 F (36.2 C) (!) 97.5 F (36.4 C) 97.8 F (36.6 C)  TempSrc:  Axillary Axillary Oral  SpO2:  100% 100% 97%  Weight: 94.3 kg     Height:        Intake/Output Summary (Last 24 hours) at 10/12/2020 1347 Last data filed at 10/12/2020 0800 Gross per 24 hour  Intake 240 ml  Output --  Net 240 ml   Filed Weights   10/09/20 1601 10/11/20 0407 10/12/20 0102  Weight: 93.4 kg 93.7 kg 94.3 kg    Exam:  . General: 65 y.o. year-old female chronically ill-appearing in no acute distress.  She is alert oriented x3.   . Cardiovascular: Regular rate and rhythm no rubs or gallops.   Marland Kitchen  Respiratory: Mild rales at bases no wheezing noted.  No use of accessory muscles to breathe. . Abdomen: Obese nontender normal bowel sounds present.   . Musculoskeletal: Anasarca edema all throughout affecting upper extremity lower extremity and abdomen. Marland Kitchen Psychiatry: Mood is appropriate for condition and setting.   Data Reviewed: CBC: Recent Labs  Lab 10/07/20 0412 10/08/20 0237 10/09/20 0631 10/11/20 0743 10/12/20 0844  WBC 3.2* 3.0* 3.3* 3.0* 3.2*  HGB 10.1* 10.8* 10.5* 10.3* 10.2*  HCT 31.0* 34.5* 33.4* 33.2* 32.6*  MCV 88.6 90.6 90.0 91.5 91.6  PLT 61* 58* 51* 64* 68*   Basic Metabolic Panel: Recent Labs  Lab 10/07/20 0412 10/08/20 0237 10/09/20 0631 10/10/20 0856 10/11/20 0743 10/12/20 0844  NA 140 141 140 135 139 142  K 3.5 3.5 3.1* 2.8* 3.3* 3.3*  CL 101 105 101 100 104 103  CO2 29 27 30 25 26 27   GLUCOSE 88 68* 72 158* 83 83  BUN 58* 61*  46* 53* 57* 65*  CREATININE 2.56* 2.69* 2.35* 2.77* 2.85* 2.93*  CALCIUM 8.4* 8.5* 8.4* 8.0* 8.5* 8.8*  MG 2.0  --  1.9  --   --   --   PHOS 3.7  --  3.4 3.6  --  4.2   GFR: Estimated Creatinine Clearance: 19 mL/min (A) (by C-G formula based on SCr of 2.93 mg/dL (H)). Liver Function Tests: Recent Labs  Lab 10/10/20 0856 10/11/20 0743 10/12/20 0844  AST  --  13*  --   ALT  --  10  --   ALKPHOS  --  64  --   BILITOT  --  0.8  --   PROT  --  6.9  --   ALBUMIN 2.6* 2.7* 2.7*   No results for input(s): LIPASE, AMYLASE in the last 168 hours. No results for input(s): AMMONIA in the last 168 hours. Coagulation Profile: No results for input(s): INR, PROTIME in the last 168 hours. Cardiac Enzymes: No results for input(s): CKTOTAL, CKMB, CKMBINDEX, TROPONINI in the last 168 hours. BNP (last 3 results) No results for input(s): PROBNP in the last 8760 hours. HbA1C: Recent Labs    10/11/20 0743  HGBA1C 5.5   CBG: Recent Labs  Lab 10/06/20 1745 10/11/20 1618  GLUCAP 93 116*   Lipid Profile: No results for input(s): CHOL, HDL, LDLCALC, TRIG, CHOLHDL, LDLDIRECT in the last 72 hours. Thyroid Function Tests: No results for input(s): TSH, T4TOTAL, FREET4, T3FREE, THYROIDAB in the last 72 hours. Anemia Panel: No results for input(s): VITAMINB12, FOLATE, FERRITIN, TIBC, IRON, RETICCTPCT in the last 72 hours. Urine analysis:    Component Value Date/Time   COLORURINE YELLOW 10/02/2020 1719   APPEARANCEUR TURBID (A) 10/02/2020 1719   LABSPEC 1.012 10/02/2020 1719   PHURINE 5.0 10/02/2020 1719   GLUCOSEU NEGATIVE 10/02/2020 1719   HGBUR MODERATE (A) 10/02/2020 1719   BILIRUBINUR NEGATIVE 10/02/2020 1719   KETONESUR NEGATIVE 10/02/2020 1719   PROTEINUR 100 (A) 10/02/2020 1719   UROBILINOGEN 0.2 03/06/2014 1644   NITRITE NEGATIVE 10/02/2020 1719   LEUKOCYTESUR LARGE (A) 10/02/2020 1719   Sepsis Labs: @LABRCNTIP (procalcitonin:4,lacticidven:4)  ) Recent Results (from the past  240 hour(s))  Culture, Urine     Status: Abnormal   Collection Time: 10/03/20  6:00 PM   Specimen: Urine, Random  Result Value Ref Range Status   Specimen Description URINE, RANDOM  Final   Special Requests   Final    NONE Performed at [facility], [place] [address] [place], [place]  [address]    Culture (A)  Final    70,000 COLONIES/mL ESCHERICHIA COLI Confirmed Extended Spectrum Beta-Lactamase Producer (ESBL).  In bloodstream infections from ESBL organisms, carbapenems are preferred over piperacillin/tazobactam. They are shown to have a lower risk of mortality.    Report Status 10/06/2020 FINAL  Final   Organism ID, Bacteria ESCHERICHIA COLI (A)  Final      Susceptibility   Escherichia coli - MIC*    AMPICILLIN >=32 RESISTANT Resistant     CEFAZOLIN >=64 RESISTANT Resistant     CEFEPIME 0.5 SENSITIVE Sensitive     CEFTRIAXONE 32 RESISTANT Resistant     CIPROFLOXACIN 1 SENSITIVE Sensitive     GENTAMICIN >=16 RESISTANT Resistant     IMIPENEM <=0.25 SENSITIVE Sensitive     NITROFURANTOIN 64 INTERMEDIATE Intermediate     TRIMETH/SULFA <=20 SENSITIVE Sensitive     AMPICILLIN/SULBACTAM 8 SENSITIVE Sensitive     PIP/TAZO <=4 SENSITIVE Sensitive     * 70,000 COLONIES/mL ESCHERICHIA COLI      Studies: No results found.  Scheduled Meds: . carvedilol  25 mg Oral BID WC  . Chlorhexidine Gluconate Cloth  6 each Topical Q0600  . ferrous sulfate  325 mg Oral BID WC  . hydrALAZINE  50 mg Oral Q8H  . hydrocortisone cream   Topical BID  . isosorbide mononitrate  30 mg Oral Daily  . nutrition supplement (JUVEN)  1 packet Oral BID BM  . potassium chloride SA  10 mEq Oral BID  . potassium chloride  40 mEq Oral Once  . sodium chloride flush  3 mL Intravenous Q12H  . torsemide  100 mg Oral Daily    Continuous Infusions: . meropenem (MERREM) IV 500 mg (10/11/20 1843)     LOS: 17 days     Kayleen Memos, MD [facility] Pager [phone]  If 7PM-7AM, please contact  night-coverage [email or web address] Password [email or web address] 10/12/2020, 1:47 PM

## 2020-10-12 NOTE — Progress Notes (Signed)
Daily Progress Note   Patient Name: Vanessa [name]       Date: 10/12/2020 DOB: 04-17-1956  Age: 65 y.o. MRN#: [id number] Attending Physician: Kayleen Memos, DO Primary Care Physician: Patient, No Pcp Per (Inactive) Admit Date: 09/24/2020  Reason for Consultation/Follow-up: Establishing goals of care  Subjective: Chart Reviewed. Updates Received.   No acute distress. Vanessa [name] is sitting up in the recliner watching television and eating grapes. Denies pain, complains of some itching. Assisted with back scratching and applying anti-itch lotion at the bedside.   Updates provided. Patient is able to summarize her hospital course. She states she is anxiously awaiting further scans to see if her cancer has metastasize. We discussed at length patient's poor candidacy for long-term HD with consideration of her co-morbidities. Patient verbalized understanding expressing she was not ready to pass away and had 7 grandchildren to live for but knows her time is limited given her health. Emotional support provided.   Education provided regarding outpatient hospice. Patient is active with outpatient Palliative ([facility]). She verbalizes understanding and states she would like to further discuss with her daughter to make sure if she returns home both she and her husband will be available to stay with her and provide the care/support she will need. [name] also states she would like to have a better understanding of her cancer process and if any areas of disease progression. Support provided.   Confirms DNR/DNI. Advanced Directives are on file and reviewed. Document confirming wishes for no life-sustaining measures, identifying her daughter Roosvelt Harps as her HCPOA, and for all wishes to be followed per document.   Patient requesting follow-up discussions on tomorrow. All questions answered and support offered.   Length of Stay: 17 days  Vital Signs: BP 111/88 (BP Location: Left Arm)   Pulse (!) 105    Temp 97.8 F (36.6 C) (Oral)   Resp 17   Ht 4\' 11"  (1.499 m)   Wt 94.3 kg   LMP 03/17/2015   SpO2 97%   BMI 41.99 kg/m  SpO2: SpO2: 97 % O2 Device: O2 Device: Nasal Cannula O2 Flow Rate: O2 Flow Rate (L/min): 2 L/min  Physical Exam: NAD, Awake, AAO x3, chronically-ill appearing RRR Diminished bilaterally Mood appropriate             Palliative Care Assessment & Plan  HPI: Per Progress Note by Dr. Dwyane Dee on 3/25--> "This56 years old female with PMH significant for chronic combined systolic and diastolic CHF(EF 35 to 78% by TTE 08/08/20), atrial fibrillation not on anticoagulation due to chronic bleeding from endometrial cancer, CKD stage IV, type II DM, hypertension, anemia of chronic disease, history of MSSA bacteremia, stage I endometrial cancer status post palliative radiation presented to the ED from SNF with shortness of breath and peripheral edema. Patient recently admitted from 08/07/2020-08/18/2020 for acute on chronic combined systolic and diastolic CHF exacerbation, AKI on CKD stage IV, and new onset A. fib with RVR. Patient was managed with IV diuresis before transition to oral torsemide. A. fib/flutter was controlled with Coreg. Anticoagulation not started due to high risk of bleeding. Patient was ultimately discharged to SNF with palliative care. Patient is admitted for acute on chronic CHF exacerbation, started on diuresis.Cardiology and nephrology has been consulted."  Code Status:  DNR  Goals of Care/Recommendations:  Continue with current plan of care  Patient is not a long-term candidate for HD. She confirms poor prognosis and is open to hospice with understanding of limited medical interventions available  specific to her co-morbidities. Wishes to speak further with her daughter to gain support in the home at discharge and awaiting further evaluation to determine cancer progression or not.   Patient is currently active with outpatient Palliative ([facility]).  If she is in agreement with hospice may notify their team to transition care. If patient declines hospice recommendations to continue with outpatient palliative for continued support and much needed ongoing discussions.   PMT will continue to support and follow as needed.   Prognosis: POOR   Discharge Planning: To Be Determined  Thank you for allowing the Palliative Medicine Team to assist in the care of this patient.  Time Total: 45 min.   Visit consisted of counseling and education dealing with the complex and emotionally intense issues of symptom management and palliative care in the setting of serious and potentially life-threatening illness.Greater than 50%  of this time was spent counseling and coordinating care related to the above assessment and plan.  Alda Lea, AGPCNP-BC  Palliative Medicine Team [phone]

## 2020-10-13 DIAGNOSIS — N179 Acute kidney failure, unspecified: Secondary | ICD-10-CM | POA: Diagnosis not present

## 2020-10-13 DIAGNOSIS — I5043 Acute on chronic combined systolic (congestive) and diastolic (congestive) heart failure: Secondary | ICD-10-CM | POA: Diagnosis not present

## 2020-10-13 DIAGNOSIS — C541 Malignant neoplasm of endometrium: Secondary | ICD-10-CM | POA: Diagnosis not present

## 2020-10-13 DIAGNOSIS — Z7189 Other specified counseling: Secondary | ICD-10-CM | POA: Diagnosis not present

## 2020-10-13 DIAGNOSIS — D61818 Other pancytopenia: Secondary | ICD-10-CM | POA: Diagnosis not present

## 2020-10-13 DIAGNOSIS — I10 Essential (primary) hypertension: Secondary | ICD-10-CM | POA: Diagnosis not present

## 2020-10-13 DIAGNOSIS — I50811 Acute right heart failure: Secondary | ICD-10-CM | POA: Diagnosis not present

## 2020-10-13 LAB — CBC
HCT: 32.6 % — ABNORMAL LOW (ref 36.0–46.0)
Hemoglobin: 10.1 g/dL — ABNORMAL LOW (ref 12.0–15.0)
MCH: 27.8 pg (ref 26.0–34.0)
MCHC: 31 g/dL (ref 30.0–36.0)
MCV: 89.8 fL (ref 80.0–100.0)
Platelets: 73 10*3/uL — ABNORMAL LOW (ref 150–400)
RBC: 3.63 MIL/uL — ABNORMAL LOW (ref 3.87–5.11)
RDW: 18.6 % — ABNORMAL HIGH (ref 11.5–15.5)
WBC: 3.3 10*3/uL — ABNORMAL LOW (ref 4.0–10.5)
nRBC: 0.9 % — ABNORMAL HIGH (ref 0.0–0.2)

## 2020-10-13 LAB — RENAL FUNCTION PANEL
Albumin: 2.9 g/dL — ABNORMAL LOW (ref 3.5–5.0)
Anion gap: 10 (ref 5–15)
BUN: 68 mg/dL — ABNORMAL HIGH (ref 8–23)
CO2: 26 mmol/L (ref 22–32)
Calcium: 8.8 mg/dL — ABNORMAL LOW (ref 8.9–10.3)
Chloride: 104 mmol/L (ref 98–111)
Creatinine, Ser: 2.94 mg/dL — ABNORMAL HIGH (ref 0.44–1.00)
GFR, Estimated: 17 mL/min — ABNORMAL LOW (ref >60)
Glucose, Bld: 87 mg/dL (ref 70–99)
Phosphorus: 4.3 mg/dL (ref 2.5–4.6)
Potassium: 3.6 mmol/L (ref 3.5–5.1)
Sodium: 140 mmol/L (ref 135–145)

## 2020-10-13 LAB — SARS CORONAVIRUS 2 (TAT 6-24 HRS): SARS Coronavirus 2: NEGATIVE

## 2020-10-13 NOTE — Progress Notes (Signed)
Chart Reviewed. Updates Received. Patient Assessed.   No acute distress. Patient sitting up in recliner requesting to return back to bed due to sacral soreness. Assisted NT with transfer and repositioning in bed. Patient tolerated well.   Updates provided. Patient states she is sure that she is not interested in continuing with dialysis. We discussed at length co-morbidities and poor prognosis. I offered to include patient's daughter in discussion. Contacted daughter, Vernie Shanks via phone and provided detailed updates. Daughter emotional. Updates provided. She expressed "I was preparing myself for this discussion and knew that it was coming!"  Daughter shares patient's youngest son (97) was murdered in [place], [place] 2 weeks ago and she recently attended his funeral service. Patient confirms expressing she was hospitalized and knew she would not be able to attend but was sad and hopeful they will find out what actually happened. Emotional support provided.   We further discussed goals of care keeping patient's values and wishes as center of discussion. Shiela and her daughter both verbalized understanding of her poor prognosis. Mrs. Linan states although she does not know how much time she has on earth she plans to live it to the fullest with no regrets. She is hopeful she can return home with family support however, during discussion, Vernie Shanks shares she had to recently move out of her current home and is residing in a local hotel until May 1.  She reports she has put a deposit on a new home however it will not be ready until then.  Daughter states she will discuss further with her brother and father in regards to ability for patient to return home with family.  Detailed education provided regarding hospice versus palliative.  Discussed with patient and daughter given patient's multiple comorbidities, limited medical interventions, and poor long-term prognosis recommendations for outpatient hospice  should be considered.  We discussed opportunities for outpatient hospice in the home versus at a facility.  Patient states she originally thought if she opted for hospice she would have to go to their facility such as [facility] which is not what she would want at this time.  We discussed all options and patient and daughter mutually verbalized understanding and wishes to proceed with outpatient hospice support.  Education provided on hospice referral in the setting patient is currently active with outpatient palliative under [facility]. Patient and daughter confirms wishes to continue with their services as they transition to hospice.   Patient is clear in goals she would like to continue to treat the treatable while hospitalized with a goal of focusing on her comfort and symptoms at discharge.   All questions answered and support provided.   Assessment -Awake, AAO x3, NAD, chronically-ill appearing -RRR -Normal breathing pattern -Poor dentition -Bowels sounds positive x4, abdomen nontender, distended -left thigh edema/fatty tissue -Mood appropriate  Plan -Continue to treat the treatable while hospitalized -Patient clear and expressed goals for no further hemodialysis.  Understands poor long-term prognosis -Daughter states phone does not have good connection without Wi-Fi requested to contact her husband's number if urgent needs and she is not available (I have placed info in demographics) -Discussed at length patient's comorbidities and prognosis with both she and her daughter.  They both mutually verbalized understanding and are in full agreement with outpatient hospice support with a goal of focusing on her comfort and symptom management at discharge.  (TOC and [facility] hospital liaison aware) -PMT will continue to support and follow as needed  Time Total: 65 min.  Visit consisted of counseling and education dealing with the complex and emotionally intense issues of symptom management and  palliative care in the setting of serious and potentially life-threatening illness.Greater than 50%  of this time was spent counseling and coordinating care related to the above assessment and plan.  Alda Lea, AGPCNP-BC  Palliative Medicine Team [phone]

## 2020-10-13 NOTE — NC FL2 (Signed)
[place] LEVEL OF CARE SCREENING TOOL     IDENTIFICATION  Patient Name: Vanessa [name] Birthdate: 22-Apr-1956 Sex: female Admission Date (Current Location): 09/24/2020  [facility] and [place] Number:  Herbalist and Address:  [facility] [place] [place] [address], [place], [place] [address]      Provider Number: [id number]  Attending Physician Name and Address:  Aline August, MD  Relative Name and Phone Number:  Vernie Shanks [phone]    Current Level of Care: Hospital Recommended Level of Care: [place] Prior Approval Number:    Date Approved/Denied:   PASRR Number: [id number]  Discharge Plan: SNF    Current Diagnoses: Patient Active Problem List   Diagnosis Date Noted  . Acute right-sided heart failure ([place])   . Acute on chronic combined systolic (congestive) and diastolic (congestive) heart failure ([place]) 09/24/2020  . Acute respiratory failure with hypoxia ([place]) 08/08/2020  . Acute diastolic CHF (congestive heart failure) ([place]) 08/08/2020  . Elevated troponin 08/08/2020  . Abnormal uterine bleeding due to primary malignant neoplasm of endometrium ([place]) 05/24/2020  . Cellulitis 04/23/2020  . Bilateral lower leg cellulitis 04/22/2020  . Neurocognitive deficits 12/11/2019  . Edema 12/04/2019  . Acute kidney injury superimposed on CKD ([place]) 12/03/2019  . Hypothermia 11/26/2019  . CKD (chronic kidney disease) stage 3, GFR 30-59 ml/min (HCC)   . Hypernatremia   . COVID-19 virus infection   . Generalized weakness   . Hypertensive urgency   . Allergic angioedema 10/19/2019  . Anasarca 10/18/2019  . Angioedema 10/18/2019  . Hypertensive emergency 10/18/2019  . Trichimoniasis 10/17/2019  . Macrocytic anemia 10/05/2019  . Esophageal dysmotility 10/04/2019  . Pressure injury of skin 09/22/2019  . Endometrial cancer ([place]) 09/21/2019  . Left humeral fracture 09/21/2019  . Iron deficiency anemia due to chronic blood  loss 09/21/2019  . MSSA bacteremia 09/18/2019  . Postmenopausal bleeding 09/15/2019  . Thrombocytopenia ([place]) 09/15/2019  . Generalized pruritus 09/15/2019  . Benign hypertension with CKD (chronic kidney disease) stage III ([place]) 09/15/2019  . Hyperuricemia 09/15/2019  . Hypokalemia 09/15/2019  . Symptomatic anemia 09/14/2019  . Non compliance w medication regimen 03/24/2015  . Dyslipidemia 03/24/2015  . DM type 2 (diabetes mellitus, type 2) ([place]) 05/23/2014  . HTN (hypertension) 05/23/2014    Orientation RESPIRATION BLADDER Height & Weight     Self,Time,Situation,Place  Normal Incontinent,External catheter (External Urinary Catheter) Weight:  (pt refused standing weight) Height:  4\' 11"  (149.9 cm)  BEHAVIORAL SYMPTOMS/MOOD NEUROLOGICAL BOWEL NUTRITION STATUS      Incontinent Diet (See Discharge Summary)  AMBULATORY STATUS COMMUNICATION OF NEEDS Skin   Limited Assist Verbally Other (Comment),Skin abrasions (Dry,Itching,lotion,hydrocortisone,abrasion,blister leg,bilateral,foam,blister,leg right,left)                       Personal Care Assistance Level of Assistance  Bathing,Feeding,Dressing Bathing Assistance: Limited assistance Feeding assistance: Independent Dressing Assistance: Limited assistance     Functional Limitations Info  Sight,Hearing,Speech Sight Info: Impaired Hearing Info: Adequate Speech Info: Adequate    SPECIAL CARE FACTORS FREQUENCY             Contractures Contractures Info: Not present    Additional Factors Info  Code Status,Allergies,Isolation Precautions Code Status Info: DNR Allergies Info: Ace Inhibitors, Angiotensin Receptor Blockers     Isolation Precautions Info: ESBL; MRSA     Current Medications (10/13/2020):  This is the current hospital active medication list Current Facility-Administered Medications  Medication Dose Route Frequency Provider Last  Rate Last Admin  . acetaminophen (TYLENOL) tablet 650 mg  650 mg Vanessa Q6H PRN  Lenore Cordia, MD   650 mg at 10/13/20 1138   Or  . acetaminophen (TYLENOL) suppository 650 mg  650 mg Rectal Q6H PRN Lenore Cordia, MD      . camphor-menthol Timoteo Ace) lotion   Topical PRN Donalynn Furlong, NP   1 application at 88/89/16 2130  . carvedilol (COREG) tablet 25 mg  25 mg Vanessa BID WC Zada Finders R, MD   25 mg at 10/13/20 1641  . Chlorhexidine Gluconate Cloth 2 % PADS 6 each  6 each Topical Q0600 Gean Quint, MD   6 each at 10/13/20 [phone]  . ferrous sulfate tablet 325 mg  325 mg Vanessa BID WC Lenore Cordia, MD   325 mg at 10/13/20 1642  . furosemide (LASIX) 160 mg in dextrose 5 % 50 mL IVPB  160 mg Intravenous Q6H Donato Heinz, MD 66 mL/hr at 10/13/20 1453 160 mg at 10/13/20 1453  . heparin injection 5,000 Units  5,000 Units Subcutaneous Q8H Hall, Carole N, DO   5,000 Units at 10/13/20 1450  . hydrALAZINE (APRESOLINE) tablet 50 mg  50 mg Vanessa Q8H Zada Finders R, MD   50 mg at 10/13/20 1454  . hydrocortisone cream 1 %   Topical BID Rai, Vernelle Emerald, MD   Given at 10/13/20 1004  . hydrOXYzine (ATARAX/VISTARIL) tablet 25 mg  25 mg Vanessa TID PRN Lenore Cordia, MD   25 mg at 10/13/20 1138  . isosorbide mononitrate (IMDUR) 24 hr tablet 30 mg  30 mg Vanessa Daily Zada Finders R, MD   30 mg at 10/13/20 1003  . nutrition supplement (JUVEN) (JUVEN) powder packet 1 packet  1 packet Vanessa BID BM Lenore Cordia, MD   1 packet at 10/13/20 1001  . ondansetron (ZOFRAN) tablet 4 mg  4 mg Vanessa Q6H PRN Lenore Cordia, MD       Or  . ondansetron (ZOFRAN) injection 4 mg  4 mg Intravenous Q6H PRN Zada Finders R, MD      . phenylephrine ((USE for PREPARATION-H)) 0.25 % suppository 1 suppository  1 suppository Rectal BID PRN Rai, Vernelle Emerald, MD   1 suppository at 10/01/20 0502  . polyethylene glycol (MIRALAX / GLYCOLAX) packet 17 g  17 g Vanessa Daily PRN Lenore Cordia, MD   17 g at 09/30/20 0930  . potassium chloride SA (KLOR-CON) CR tablet 10 mEq  10 mEq Vanessa BID Einar Grad, RPH   10 mEq  at 10/13/20 1003  . sodium chloride flush (NS) 0.9 % injection 3 mL  3 mL Intravenous Q12H Lenore Cordia, MD   3 mL at 10/13/20 1004     Discharge Medications: Please see discharge summary for a list of discharge medications.  Relevant Imaging Results:  Relevant Lab Results:   Additional Information [phone]. Add [facility], LCSW

## 2020-10-13 NOTE — Progress Notes (Signed)
[facility])  Referral received for hospice services at [facility].  Unable to reach dtr due to constraints with her phone.  [facility] will plan to admit her to hospice once she returns to [facility].  Venia Carbon RN, BSN, [facility] Liaison

## 2020-10-13 NOTE — Progress Notes (Signed)
Patient ID: Vanessa [name], female   DOB: [birth year], 65 y.o.   MRN: [id number]    HPI:  Creatinine stable.  Got Lasix and albumin last night.  No issues today.    Marland Kitchen O:BP 118/83 (BP Location: Right Arm)   Pulse (!) 104   Temp (!) 97.3 F (36.3 C) (Oral)   Resp 14   Ht 4\' 11"  (1.499 m)   Wt 93.5 kg   LMP 03/17/2015   SpO2 99%   BMI 41.63 kg/m   Intake/Output Summary (Last 24 hours) at 10/13/2020 1444 Last data filed at 10/13/2020 1000 Gross per 24 hour  Intake 456 ml  Output 100 ml  Net 356 ml   Intake/Output: I/O last 3 completed shifts: In: 576 [P.O.:460; IV Piggyback:116] Out: 200 [Urine:200]  Intake/Output this shift:  Total I/O In: 240 [P.O.:240] Out: -  Weight change: -0.803 kg Gen: NAD CVS: irregular Resp: CTA Abd: distended, +fluid wave, nontender Ext: 2+ anasarca to thighs  Recent Labs  Lab 10/07/20 0412 10/08/20 0237 10/09/20 0631 10/10/20 0856 10/11/20 0743 10/12/20 0844 10/13/20 0332  NA 140 141 140 135 139 142 140  K 3.5 3.5 3.1* 2.8* 3.3* 3.3* 3.6  CL 101 105 101 100 104 103 104  CO2 29 27 30 25 26 27 26   GLUCOSE 88 68* 72 158* 83 83 87  BUN 58* 61* 46* 53* 57* 65* 68*  CREATININE 2.56* 2.69* 2.35* 2.77* 2.85* 2.93* 2.94*  ALBUMIN  --   --   --  2.6* 2.7* 2.7* 2.9*  CALCIUM 8.4* 8.5* 8.4* 8.0* 8.5* 8.8* 8.8*  PHOS 3.7  --  3.4 3.6  --  4.2 4.3  AST  --   --   --   --  13*  --   --   ALT  --   --   --   --  10  --   --    Liver Function Tests: Recent Labs  Lab 10/11/20 0743 10/12/20 0844 10/13/20 0332  AST 13*  --   --   ALT 10  --   --   ALKPHOS 64  --   --   BILITOT 0.8  --   --   PROT 6.9  --   --   ALBUMIN 2.7* 2.7* 2.9*   No results for input(s): LIPASE, AMYLASE in the last 168 hours. No results for input(s): AMMONIA in the last 168 hours. CBC: Recent Labs  Lab 10/08/20 0237 10/09/20 0631 10/11/20 0743 10/12/20 0844 10/13/20 0332  WBC 3.0* 3.3* 3.0* 3.2* 3.3*  HGB 10.8* 10.5* 10.3* 10.2* 10.1*  HCT 34.5* 33.4* 33.2*  32.6* 32.6*  MCV 90.6 90.0 91.5 91.6 89.8  PLT 58* 51* 64* 68* 73*   Cardiac Enzymes: No results for input(s): CKTOTAL, CKMB, CKMBINDEX, TROPONINI in the last 168 hours. CBG: Recent Labs  Lab 10/06/20 1745 10/11/20 1618  GLUCAP 93 116*    Iron Studies: No results for input(s): IRON, TIBC, TRANSFERRIN, FERRITIN in the last 72 hours. Studies/Results: CT CHEST ABDOMEN PELVIS W CONTRAST  Result Date: 10/12/2020 CLINICAL DATA:  Endometrial cancer staging EXAM: CT CHEST, ABDOMEN, AND PELVIS WITH CONTRAST TECHNIQUE: Multidetector CT imaging of the chest, abdomen and pelvis was performed following the standard protocol during bolus administration of intravenous contrast. CONTRAST:  158mL OMNIPAQUE IOHEXOL 300 MG/ML  SOLN COMPARISON:  11/07/2019 FINDINGS: CT CHEST FINDINGS Cardiovascular: Scattered aortic atherosclerosis. Cardiomegaly. No pericardial effusion. Mediastinum/Nodes: No enlarged mediastinal, hilar, or axillary lymph nodes. Thyroid gland, trachea, and  esophagus demonstrate no significant findings. Lungs/Pleura: Moderate right, small left pleural effusions and associated atelectasis or consolidation. Pulmonary nodule of the anterior inferior right upper lobe measuring 2.1 x 1.6 cm (series 4, image 76). Pulmonary nodule of the superior segment left lower lobe measuring 1.7 x 1.6 cm (series 4, image 78). Musculoskeletal: No chest wall mass or suspicious bone lesions identified. CT ABDOMEN PELVIS FINDINGS Hepatobiliary: Heterogeneous contrast enhancement of the liver parenchyma. No focal liver lesion. No gallstones, gallbladder wall thickening, or biliary dilatation. Pancreas: Unremarkable. No pancreatic ductal dilatation or surrounding inflammatory changes. Spleen: Normal in size without significant abnormality. Adrenals/Urinary Tract: Adrenal glands are unremarkable. Markedly atrophic left kidney. The right kidney is normal in appearance. No hydronephrosis. Bladder is unremarkable. Stomach/Bowel:  Stomach is within normal limits. Appendix appears normal. No evidence of bowel wall thickening, distention, or inflammatory changes. Vascular/Lymphatic: No significant vascular findings are present. No enlarged abdominal or pelvic lymph nodes. Reproductive: No mass or other abnormality. Other: Severe anasarca. Large volume ascites throughout the abdomen and pelvis. Musculoskeletal: No acute or significant osseous findings. IMPRESSION: 1. Pulmonary nodules of the anterior inferior right upper lobe and superior segment left lower lobe measuring up to 2.1 cm. These are highly concerning for pulmonary metastatic disease. 2. Moderate right, small left pleural effusions and associated atelectasis or consolidation. 3. Large volume ascites throughout the abdomen and pelvis without specific evidence of metastatic disease. No significant lymphadenopathy or peritoneal nodularity appreciated. 4. No pelvic mass appreciated. 5. Heterogeneous contrast enhancement of the liver parenchyma, suggesting passive hepatic congestion and/or chronic liver disease. 6. Markedly atrophic left kidney. 7. Severe anasarca. Aortic Atherosclerosis (ICD10-I70.0). Electronically Signed   By: Eddie Candle M.D.   On: 10/12/2020 15:58   . carvedilol  25 mg Oral BID WC  . Chlorhexidine Gluconate Cloth  6 each Topical Q0600  . ferrous sulfate  325 mg Oral BID WC  . heparin injection (subcutaneous)  5,000 Units Subcutaneous Q8H  . hydrALAZINE  50 mg Oral Q8H  . hydrocortisone cream   Topical BID  . isosorbide mononitrate  30 mg Oral Daily  . nutrition supplement (JUVEN)  1 packet Oral BID BM  . potassium chloride SA  10 mEq Oral BID  . sodium chloride flush  3 mL Intravenous Q12H    BMET    Component Value Date/Time   NA 140 10/13/2020 0332   NA 146 12/10/2019 0000   K 3.6 10/13/2020 0332   CL 104 10/13/2020 0332   CO2 26 10/13/2020 0332   GLUCOSE 87 10/13/2020 0332   BUN 68 (H) 10/13/2020 0332   BUN 32 (A) 12/10/2019 0000    CREATININE 2.94 (H) 10/13/2020 0332   CREATININE 1.21 (H) 08/15/2016 1656   CALCIUM 8.8 (L) 10/13/2020 0332   GFRNONAA 17 (L) 10/13/2020 0332   GFRNONAA 48 (L) 08/15/2016 1656   GFRAA 30 (L) 03/18/2020 1254   GFRAA 56 (L) 08/15/2016 1656   CBC    Component Value Date/Time   WBC 3.3 (L) 10/13/2020 0332   RBC 3.63 (L) 10/13/2020 0332   HGB 10.1 (L) 10/13/2020 0332   HCT 32.6 (L) 10/13/2020 0332   HCT 29.5 (L) 11/06/2019 2140   PLT 73 (L) 10/13/2020 0332   MCV 89.8 10/13/2020 0332   MCH 27.8 10/13/2020 0332   MCHC 31.0 10/13/2020 0332   RDW 18.6 (H) 10/13/2020 0332   LYMPHSABS 0.7 08/07/2020 1736   MONOABS 0.1 08/07/2020 1736   EOSABS 0.0 08/07/2020 1736   BASOSABS 0.1 08/07/2020 1736  Assessment/Plan:  1. 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  She does not want any more dialysis and is willing to try IV lasix again.  Continue IV lasix and albumin administration for anasarca.  Palliative care is following.  The next 24-28 hrs will be very telling re: CIN. 2. Acute on chronic combined systolic and diastolic CHF - Cardiology has signed off as she was not felt to be a good candidate for long term advanced HF therapies.  She has not responded to po torsemide and she does not want more dialysis, so will resume IV lasix and follow response.  Will dose with IV albumin prior to IV lasix.  May need to resume metolazone as well.  3. H/o atrial flutter with paroxysmal atrial tachycardia - not on anticoagulation due to increased risk of bleeding. 4. HTN - was hypotensive with RVR, now improved with rate control  5. Endometrial cancer - stage I, not operable and received palliative XRT.  CT scan of chest and abdomen with suspicious pulmonary nodules of the anterior inferior RUL and superior segment of LLL.  6. Ascites - unclear if this is from CHF or endometrial cancer. 7. Disposition: receives [facility] palliative services as  OP   Madelon Lips MD [facility] [phone]

## 2020-10-13 NOTE — TOC Progression Note (Addendum)
Transition of Care [facility]) - Progression Note    Patient Details  Name: Vanessa [name] MRN: [id number] Date of Birth: Sep 18, 1955  Transition of Care [facility]) CM/SW [place], [place] Phone Number: 10/13/2020, 1:01 PM  Clinical Narrative:      Update: CSW met with patients daughter. Patients daughter Vanessa [name] signed paperwork needed for [facility]. Patients daughter had a palliative question. CSW got in touch with Nickki with palliative who helped answer patients daughters questions. CSW faxed over paperwork to [facility] with [facility].CSW called [facility] and let them know that plan is for patient to return to [facility]. CSW will continue to follow and assist with discharge plans for patient.  Patient has SNF bed at [facility] with hospice services to follow.  Update: CSW received return call from patients daughter. CSW explained that we need paperwork filled out for patient for patient to return to [facility] SNF.CSW spoke with Narda Rutherford at [facility] regarding paperwork. CSW requested for Janie to send over paperwork that needs to be filled out to [place], so that patients daughter can fill it out. She will arrive at hospital soon and says she can fill it out while she is here. CSW awaiting response from [place]. CSW will continue to follow and assist with dc planning needs.  Update: CSW spoke with Janie with [facility]. Narda Rutherford reported that patient no longer has long term bed there. CSW asked if she can resend referral for review. Narda Rutherford says that daughter needs to fill out paperwork to be able to possibly return for long term snf placement. CSW tried to call patients daughter ,no answer. CSW left voicemail and awaiting callback.  CSW received message from [place] with [facility] that patient has lost her SNF bed there.CSW called patients daughter and left vociemail. CSW awaiting callback. CSW spoke with patient and patient is agreeable for CSW to fax out referral for SNF placement near  the [place] area.Pending SNF bed offers.CSW spoke with Lenna Sciara with [facility] who confirmed she received referral for outpatient hospice services for patient. CSW will continue to follow and assist with discharge planning needs.   Expected Discharge Plan: Skilled Nursing Facility Barriers to Discharge: Continued Medical Work up  Expected Discharge Plan and Services Expected Discharge Plan: [place] In-house Referral: Clinical Social Work     Living arrangements for the past 2 months: [place]                                       Social Determinants of Health (SDOH) Interventions    Readmission Risk Interventions Readmission Risk Prevention Plan 10/07/2020 11/08/2019 10/21/2019  Transportation Screening - Complete Complete  PCP or Specialist Appt within 3-5 Days - - -  Not Complete comments - - -  [place] or [place] Work Consult for [place] Planning/Counseling - - -  [place] - - -  Medication Review Press photographer) - Complete Referral to Pharmacy  PCP or Specialist appointment within 3-5 days of discharge - Complete Complete  HRI or Home Care Consult - Complete (No Data)  SW Recovery Care/Counseling Consult - Complete Complete  Palliative Care Screening Complete Not Applicable Not Applicable  Skilled Nursing Facility Complete Complete Complete  Some recent data might be hidden

## 2020-10-13 NOTE — Progress Notes (Signed)
Physical Therapy Treatment Patient Details Name: Vanessa [name] MRN: [id number] DOB: 12-15-1955 Today's Date: 10/13/2020    History of Present Illness Patient is a 65 y.o. female admitted from SNF on 09/24/20 with SOB, weakness, edema and dizziness. Workup for volume overload secondary to heart failure and CKD. S/p L IJ non-tunneled HD cath placement 3/27. iHD initiated 3/28. Of note, recent admission 07/30/20-08/18/20 for CHF exacerbation, AKI and new A-fib with RVR with d/c to SNF. CT chest/abdomen/pelvis ordered 10/13/20 to assess for any metastasis. PMH includes endometrial carcinoma (2021), COVID-19 PNA (2021), MSSA baceteremia, essential HTN, DM2, CKD, chronic diastolic HF.    PT Comments    Patient progressing well towards PT goals. Continues to report pain in buttocks due to hemorrhoids with difficulty sitting for long periods. Improved ambulation distance with Min guard assist and use of RW for support. Chair follow utilized due to fatigue. VSS on RA. Encouraged continued mobility as much as tolerated. Recommend bringing depends/pull up to donn prior to ambulation due to incontinence. Will follow and progress as able.    Follow Up Recommendations  SNF;Supervision for mobility/OOB     Equipment Recommendations  None recommended by PT    Recommendations for Other Services       Precautions / Restrictions Precautions Precautions: Fall;Other (comment) Precaution Comments: Bladder/bowel incontinence with mobility at times; sore bottom due to hemorrhoids Restrictions Weight Bearing Restrictions: No    Mobility  Bed Mobility Overal bed mobility: Needs Assistance Bed Mobility: Rolling;Sidelying to Sit Rolling: Min guard Sidelying to sit: Min guard;HOB elevated       General bed mobility comments: Rolling to right/left for pericare upon arrival; use of rail to elevate trunk to get to EOB. Increased time.    Transfers Overall transfer level: Needs assistance Equipment used:  Rolling walker (2 wheeled) Transfers: Sit to/from Stand Sit to Stand: Min guard         General transfer comment: Min guard for safety. Stood from [employer], from chair x1, slow to rise. Difficulty sitting due to soreness in bottom. Transferred to chair post ambulation.  Ambulation/Gait Ambulation/Gait assistance: Min guard Gait Distance (Feet): 62 Feet (x2 bouts) Assistive device: Rolling walker (2 wheeled) Gait Pattern/deviations: Step-through pattern;Decreased stride length;Trunk flexed Gait velocity: reduced Gait velocity interpretation: <1.31 ft/sec, indicative of household ambulator General Gait Details: Slow, mostly steady gait with RW for support; 1 seated rest break. VSS on RA.   Stairs             Wheelchair Mobility    Modified Rankin (Stroke Patients Only)       Balance Overall balance assessment: Needs assistance Sitting-balance support: Feet supported;Single extremity supported Sitting balance-Leahy Scale: Fair Sitting balance - Comments: Fatigues without back support sitting in chair.   Standing balance support: During functional activity Standing balance-Leahy Scale: Poor Standing balance comment: Reliant on BUE support                            Cognition Arousal/Alertness: Awake/alert Behavior During Therapy: WFL for tasks assessed/performed Overall Cognitive Status: No family/caregiver present to determine baseline cognitive functioning                                 General Comments: pt following commands, can be easily distracted at times but pleasant and cooperative.      Exercises      General Comments General comments (  skin integrity, edema, etc.): VSS on RA.      Pertinent Vitals/Pain Pain Assessment: Faces Faces Pain Scale: Hurts whole lot Pain Location: Buttocks with pericare Pain Descriptors / Indicators: Discomfort;Burning;Grimacing;Guarding Pain Intervention(s): Monitored during session;Repositioned     Home Living                      Prior Function            PT Goals (current goals can now be found in the care plan section) Progress towards PT goals: Progressing toward goals    Frequency    Min 2X/week      PT Plan Current plan remains appropriate    Co-evaluation              AM-PAC PT "6 Clicks" Mobility   Outcome Measure  Help needed turning from your back to your side while in a flat bed without using bedrails?: A Little Help needed moving from lying on your back to sitting on the side of a flat bed without using bedrails?: A Little Help needed moving to and from a bed to a chair (including a wheelchair)?: A Little Help needed standing up from a chair using your arms (e.g., wheelchair or bedside chair)?: A Little Help needed to walk in hospital room?: A Little Help needed climbing 3-5 steps with a railing? : A Lot 6 Click Score: 17    End of Session Equipment Utilized During Treatment: Gait belt Activity Tolerance: Patient tolerated treatment well Patient left: in chair;with call bell/phone within reach;with chair alarm set Nurse Communication: Mobility status PT Visit Diagnosis: Unsteadiness on feet (R26.81);Muscle weakness (generalized) (M62.81);Difficulty in walking, not elsewhere classified (R26.2);Pain Pain - part of body:  (bottom)     Time: 8250-5397 PT Time Calculation (min) (ACUTE ONLY): 23 min  Charges:  $Gait Training: 8-22 mins $Therapeutic Activity: 8-22 mins                     Marisa Severin, PT, DPT Acute Rehabilitation Services Pager [phone] Office [place] 10/13/2020, 12:40 PM

## 2020-10-13 NOTE — Progress Notes (Signed)
Gynecologic Oncology Follow Up  65 year old female with a history of clinical Stage I, grade 3 endometrial cancer, not a surgical candidate s/p previous IUD placement (ultimately expelled) and palliative radiation. CT imaging has been reviewed by Dr. Berline Lopes. The pulmonary nodules are suspicious for metastatic disease (not the reason for her heart failure and kidney disease) but given the low volume of disease in the chest with no pelvic or intra-abdominal disease, it is felt that her other co-morbidities (CHF, Stage IV CKD) will ultimately lead to her demise.    Patient resting in bed in no acute distress. Denies abdominal pain but reports tailbone/buttock discomfort from sitting. Denies vaginal bleeding. States she is tolerating her diet without nausea or emesis but the food is "not seasoned." States her bowels are moving and she is voiding. She reports feeling better after having "the fluid taken off" (no recent paracentesis noted, assuming from IV lasix). States she talked with the Hospice NP with the plan for home hospice while staying with her family.   Discussed CT findings and the above interpretation by Dr. Berline Lopes. All questions answered. No concerns or needs voiced at the end of the visit. Advised we would touch base with her after discharge and arrange follow up as needed per Dr. Berline Lopes.

## 2020-10-13 NOTE — Progress Notes (Signed)
Patient ID: Vanessa [name], female   DOB: 23-Nov-1955, 65 y.o.   MRN: [id number]  PROGRESS NOTE    Vanessa [name]  DPO:[id number] DOB: [birth year] DOA: 09/24/2020 PCP: Patient, No Pcp Per (Inactive)   Brief Narrative:  65 years old female withpast medical history ofchronic combined systolic and diastolic CHF(EF 35 to 43% by TTE 08/08/20), atrial fibrillation not on anticoagulation due to chronic bleeding from endometrial cancer, CKD stage IV, type II DM, hypertension, anemia of chronic disease, history of MSSA bacteremia, stage I endometrial cancer status post palliative radiation presented to thehospital from skilled nursing facility with complaints of shortness of breath and peripheral edema.  She was started on IV diuretics.  Cardiology and nephrology were consulted.  Renal function started to worsen with concern for cardiorenal syndrome.  Per nephrology, patient is not a candidate for hemodialysis.  Nephrology has recommended hospice care.  Palliative care was consulted.  Prior hospitalist discussed  with Dr. Berline Lopes the patient's GYN oncologist on 10/12/2020 about the patient's prognosis with endometrial cancer.  CT chest abdomen and pelvis ordered to assess for any metastasis.  Outpatient follow-up with OB/GYN   Assessment & Plan:   Acute on chronic combined systolic and diastolic heart failure -Echo on 1/22 had shown EF of 35 to 40% -Initially treated with IV Lasix which was switched to torsemide but again switched to IV Lasix.  Nephrology was reconsulted on 10/12/2020 because patient had accidentally received IV contrast per radiology for CT of the chest/abdomen and pelvis -Monitor renal function -Strict input and output.  Daily weights.  Fluid restriction.  Continue Coreg, hydralazine, isosorbide mononitrate as well.  Acute kidney injury on chronic any disease stage IV -Presented with creatinine of 4.3.  Creatinine 2.94 today.  Nephrology was reconsulted on 10/12/2020 as above.  Currently on  IV Lasix. -Per nephrology: Patient is not a candidate for hemodialysis.  Temporary hemodialysis catheter removed for nephrology  ESBL E. coli UTI -Treated with meropenem which was discontinued on 10/11/2020.  Hypokalemia -Improving  Pancytopenia -Questionable cause.  Might be secondary to cancer as well.  No signs of bleeding.  History of endometrial cancer -Prior hospitalist discussed with Dr. Berline Lopes the patient's GYN oncologist on 10/12/2020 about the patient's prognosis with endometrial cancer.  CT chest abdomen and pelvis ordered to assess for any metastasis showed pulmonary metastases with bilateral pleural effusions, ascites and massive anasarca  Paroxysmal A. Fib -Currently rate controlled with Coreg.  Not on anticoagulation due to risk of bleeding  Essential hypertension -Blood pressure stable.  Continue Coreg, hydralazine, Lasix, isosorbide mononitrate  Morbid obesity -Outpatient follow-up  Generalized conditioning -Overall prognosis is very poor.  Palliative care following.  Consider hospice  DVT prophylaxis: Subcutaneous heparin Code Status: DNR Family Communication: None at bedside Disposition Plan: Status is: Inpatient  Remains inpatient appropriate because:Inpatient level of care appropriate due to severity of illness   Dispo:  Patient From: Home  Planned Disposition: [place] versus home with hospice  Medically stable for discharge: No  Consultants: Nephrology/palliative care/GYN oncology  Procedures: Hemodialysis catheter placement and removal  Antimicrobials:  Anti-infectives (From admission, onward)   Start     Dose/Rate Route Frequency Ordered Stop   10/06/20 1800  meropenem (MERREM) 500 mg in sodium chloride 0.9 % 100 mL IVPB        500 mg 200 mL/hr over 30 Minutes Intravenous Every 24 hours 10/06/20 1704 10/13/20 1759   10/06/20 1200  fosfomycin (MONUROL) packet 3 g  3 g Oral  Once 10/06/20 1109 10/06/20 1200   10/03/20 0930   cefTRIAXone (ROCEPHIN) 1 g in sodium chloride 0.9 % 100 mL IVPB  Status:  Discontinued        1 g 200 mL/hr over 30 Minutes Intravenous Every 24 hours 10/03/20 1941 10/06/20 1109       Subjective: Patient seen and examined at bedside.  Denies worsening abdominal pain.  Poor historian.  Denies fever, vomiting or worsening shortness of breath.  Objective: Vitals:   10/12/20 2130 10/13/20 0021 10/13/20 0403 10/13/20 0741  BP: (!) 135/97 (!) 159/96 117/78 125/86  Pulse:  (!) 106 (!) 104 (!) 105  Resp:  14 18 16   Temp:  (!) 97 F (36.1 C) (!) 97.2 F (36.2 C) (!) 97.3 F (36.3 C)  TempSrc:  Axillary Axillary Oral  SpO2:  99%  100%  Weight:   93.5 kg   Height:        Intake/Output Summary (Last 24 hours) at 10/13/2020 1109 Last data filed at 10/13/2020 1000 Gross per 24 hour  Intake 456 ml  Output 100 ml  Net 356 ml   Filed Weights   10/11/20 0407 10/12/20 0102 10/13/20 0403  Weight: 93.7 kg 94.3 kg 93.5 kg    Examination:  General exam: Appears calm and comfortable.  Looks chronically ill and deconditioned.  Currently on 2 L oxygen via nasal cannula Respiratory system: Bilateral decreased breath sounds at bases with scattered crackles Cardiovascular system: S1 & S2 heard, tachycardic Gastrointestinal system: Abdomen is distended, soft and nontender. Normal bowel sounds heard. Extremities: No cyanosis, clubbing; lower extremity edema present Central nervous system: Awake, very slow to respond.  Poor historian. No focal neurological deficits. Moving extremities Skin: No rashes, lesions or ulcers.  Chronic lower extremity skin changes present Psychiatry: Flat affect   Data Reviewed: I have personally reviewed following labs and imaging studies  CBC: Recent Labs  Lab 10/08/20 0237 10/09/20 0631 10/11/20 0743 10/12/20 0844 10/13/20 0332  WBC 3.0* 3.3* 3.0* 3.2* 3.3*  HGB 10.8* 10.5* 10.3* 10.2* 10.1*  HCT 34.5* 33.4* 33.2* 32.6* 32.6*  MCV 90.6 90.0 91.5 91.6 89.8   PLT 58* 51* 64* 68* 73*   Basic Metabolic Panel: Recent Labs  Lab 10/07/20 0412 10/08/20 0237 10/09/20 0631 10/10/20 0856 10/11/20 0743 10/12/20 0844 10/13/20 0332  NA 140   < > 140 135 139 142 140  K 3.5   < > 3.1* 2.8* 3.3* 3.3* 3.6  CL 101   < > 101 100 104 103 104  CO2 29   < > 30 25 26 27 26   GLUCOSE 88   < > 72 158* 83 83 87  BUN 58*   < > 46* 53* 57* 65* 68*  CREATININE 2.56*   < > 2.35* 2.77* 2.85* 2.93* 2.94*  CALCIUM 8.4*   < > 8.4* 8.0* 8.5* 8.8* 8.8*  MG 2.0  --  1.9  --   --   --   --   PHOS 3.7  --  3.4 3.6  --  4.2 4.3   < > = values in this interval not displayed.   GFR: Estimated Creatinine Clearance: 18.8 mL/min (A) (by C-G formula based on SCr of 2.94 mg/dL (H)). Liver Function Tests: Recent Labs  Lab 10/10/20 0856 10/11/20 0743 10/12/20 0844 10/13/20 0332  AST  --  13*  --   --   ALT  --  10  --   --   ALKPHOS  --  64  --   --   BILITOT  --  0.8  --   --   PROT  --  6.9  --   --   ALBUMIN 2.6* 2.7* 2.7* 2.9*   No results for input(s): LIPASE, AMYLASE in the last 168 hours. No results for input(s): AMMONIA in the last 168 hours. Coagulation Profile: No results for input(s): INR, PROTIME in the last 168 hours. Cardiac Enzymes: No results for input(s): CKTOTAL, CKMB, CKMBINDEX, TROPONINI in the last 168 hours. BNP (last 3 results) No results for input(s): PROBNP in the last 8760 hours. HbA1C: Recent Labs    10/11/20 0743  HGBA1C 5.5   CBG: Recent Labs  Lab 10/06/20 1745 10/11/20 1618  GLUCAP 93 116*   Lipid Profile: No results for input(s): CHOL, HDL, LDLCALC, TRIG, CHOLHDL, LDLDIRECT in the last 72 hours. Thyroid Function Tests: No results for input(s): TSH, T4TOTAL, FREET4, T3FREE, THYROIDAB in the last 72 hours. Anemia Panel: No results for input(s): VITAMINB12, FOLATE, FERRITIN, TIBC, IRON, RETICCTPCT in the last 72 hours. Sepsis Labs: No results for input(s): PROCALCITON, LATICACIDVEN in the last 168 hours.  Recent Results  (from the past 240 hour(s))  Culture, Urine     Status: Abnormal   Collection Time: 10/03/20  6:00 PM   Specimen: Urine, Random  Result Value Ref Range Status   Specimen Description URINE, RANDOM  Final   Special Requests   Final    NONE Performed at [facility], [address], [place], [place] [address]    Culture (A)  Final    70,000 COLONIES/mL ESCHERICHIA COLI Confirmed Extended Spectrum Beta-Lactamase Producer (ESBL).  In bloodstream infections from ESBL organisms, carbapenems are preferred over piperacillin/tazobactam. They are shown to have a lower risk of mortality.    Report Status 10/06/2020 FINAL  Final   Organism ID, Bacteria ESCHERICHIA COLI (A)  Final      Susceptibility   Escherichia coli - MIC*    AMPICILLIN >=32 RESISTANT Resistant     CEFAZOLIN >=64 RESISTANT Resistant     CEFEPIME 0.5 SENSITIVE Sensitive     CEFTRIAXONE 32 RESISTANT Resistant     CIPROFLOXACIN 1 SENSITIVE Sensitive     GENTAMICIN >=16 RESISTANT Resistant     IMIPENEM <=0.25 SENSITIVE Sensitive     NITROFURANTOIN 64 INTERMEDIATE Intermediate     TRIMETH/SULFA <=20 SENSITIVE Sensitive     AMPICILLIN/SULBACTAM 8 SENSITIVE Sensitive     PIP/TAZO <=4 SENSITIVE Sensitive     * 70,000 COLONIES/mL ESCHERICHIA COLI         Radiology Studies: CT CHEST ABDOMEN PELVIS W CONTRAST  Result Date: 10/12/2020 CLINICAL DATA:  Endometrial cancer staging EXAM: CT CHEST, ABDOMEN, AND PELVIS WITH CONTRAST TECHNIQUE: Multidetector CT imaging of the chest, abdomen and pelvis was performed following the standard protocol during bolus administration of intravenous contrast. CONTRAST:  173mL OMNIPAQUE IOHEXOL 300 MG/ML  SOLN COMPARISON:  11/07/2019 FINDINGS: CT CHEST FINDINGS Cardiovascular: Scattered aortic atherosclerosis. Cardiomegaly. No pericardial effusion. Mediastinum/Nodes: No enlarged mediastinal, hilar, or axillary lymph nodes. Thyroid gland, trachea, and esophagus demonstrate no significant findings.  Lungs/Pleura: Moderate right, small left pleural effusions and associated atelectasis or consolidation. Pulmonary nodule of the anterior inferior right upper lobe measuring 2.1 x 1.6 cm (series 4, image 76). Pulmonary nodule of the superior segment left lower lobe measuring 1.7 x 1.6 cm (series 4, image 78). Musculoskeletal: No chest wall mass or suspicious bone lesions identified. CT ABDOMEN PELVIS FINDINGS Hepatobiliary: Heterogeneous contrast enhancement of the liver  parenchyma. No focal liver lesion. No gallstones, gallbladder wall thickening, or biliary dilatation. Pancreas: Unremarkable. No pancreatic ductal dilatation or surrounding inflammatory changes. Spleen: Normal in size without significant abnormality. Adrenals/Urinary Tract: Adrenal glands are unremarkable. Markedly atrophic left kidney. The right kidney is normal in appearance. No hydronephrosis. Bladder is unremarkable. Stomach/Bowel: Stomach is within normal limits. Appendix appears normal. No evidence of bowel wall thickening, distention, or inflammatory changes. Vascular/Lymphatic: No significant vascular findings are present. No enlarged abdominal or pelvic lymph nodes. Reproductive: No mass or other abnormality. Other: Severe anasarca. Large volume ascites throughout the abdomen and pelvis. Musculoskeletal: No acute or significant osseous findings. IMPRESSION: 1. Pulmonary nodules of the anterior inferior right upper lobe and superior segment left lower lobe measuring up to 2.1 cm. These are highly concerning for pulmonary metastatic disease. 2. Moderate right, small left pleural effusions and associated atelectasis or consolidation. 3. Large volume ascites throughout the abdomen and pelvis without specific evidence of metastatic disease. No significant lymphadenopathy or peritoneal nodularity appreciated. 4. No pelvic mass appreciated. 5. Heterogeneous contrast enhancement of the liver parenchyma, suggesting passive hepatic congestion and/or  chronic liver disease. 6. Markedly atrophic left kidney. 7. Severe anasarca. Aortic Atherosclerosis (ICD10-I70.0). Electronically Signed   By: Eddie Candle M.D.   On: 10/12/2020 15:58        Scheduled Meds: . carvedilol  25 mg Oral BID WC  . Chlorhexidine Gluconate Cloth  6 each Topical Q0600  . ferrous sulfate  325 mg Oral BID WC  . heparin injection (subcutaneous)  5,000 Units Subcutaneous Q8H  . hydrALAZINE  50 mg Oral Q8H  . hydrocortisone cream   Topical BID  . isosorbide mononitrate  30 mg Oral Daily  . nutrition supplement (JUVEN)  1 packet Oral BID BM  . potassium chloride SA  10 mEq Oral BID  . sodium chloride flush  3 mL Intravenous Q12H   Continuous Infusions: . furosemide 160 mg (10/13/20 1000)  . meropenem (MERREM) IV 500 mg (10/11/20 1843)          Aline August, MD [facility] 10/13/2020, 11:09 AM

## 2020-10-13 NOTE — Progress Notes (Signed)
Occupational Therapy Treatment Patient Details Name: Vanessa [name] MRN: [id number] DOB: Feb 07, 1956 Today's Date: 10/13/2020    History of present illness Patient is a 65 y.o. female admitted from SNF on 09/24/20 with SOB, weakness, edema and dizziness. Workup for volume overload secondary to heart failure and CKD. S/p L IJ non-tunneled HD cath placement 3/27. iHD initiated 3/28. Of note, recent admission 07/30/20-08/18/20 for CHF exacerbation, AKI and new A-fib with RVR with d/c to SNF. CT chest/abdomen/pelvis ordered 10/13/20 to assess for any metastasis. PMH includes endometrial carcinoma (2021), COVID-19 PNA (2021), MSSA baceteremia, essential HTN, DM2, CKD, chronic diastolic HF.   OT comments  Patient continues to make steady progress towards goals in skilled OT session. Patient's session encompassed exercises sitting EOB, bed mobility, and sit<>stands with extended bout of standing x1 for peri care. Pt motivated to engage in therapy, however remains incontinent of bowels requiring total A for hygiene after completing exercises (heel raises x10, leg kicks bilaterally x10). Pt able to complete additional sit<>stand to advance to [facility], and positioned in sidelying at end of session. Discharge remains appropriate, therapy will continue to follow acutely.     Follow Up Recommendations  SNF;Supervision/Assistance - 24 hour    Equipment Recommendations  Wheelchair (measurements OT);Wheelchair cushion (measurements OT);Hospital bed;3 in 1 bedside commode    Recommendations for Other Services      Precautions / Restrictions Precautions Precautions: Fall;Other (comment) Precaution Comments: Bladder/bowel incontinence with mobility at times; sore bottom due to hemorrhoids Restrictions Weight Bearing Restrictions: No       Mobility Bed Mobility Overal bed mobility: Needs Assistance Bed Mobility: Rolling;Sidelying to Sit Rolling: Min guard Sidelying to sit: HOB elevated;Mod assist Supine to sit:  Min assist;HOB elevated     General bed mobility comments: mod A to advance legs off of bed, min A to return to bed after peri care and extended standing bout    Transfers Overall transfer level: Needs assistance Equipment used: Rolling walker (2 wheeled) Transfers: Sit to/from Stand Sit to Stand: Min guard         General transfer comment: Min G for safety. Stood EOB x1 for extended period for peri care, and x1 to complete side steps to [facility] at end of session    Balance Overall balance assessment: Needs assistance Sitting-balance support: Feet supported;Single extremity supported Sitting balance-Leahy Scale: Fair Sitting balance - Comments: Fatigues without back support sitting in chair.   Standing balance support: During functional activity Standing balance-Leahy Scale: Poor Standing balance comment: Reliant on BUE support                           ADL either performed or assessed with clinical judgement   ADL Overall ADL's : Needs assistance/impaired Eating/Feeding: Independent;Sitting           Lower Body Bathing: Total assistance;Bed level Lower Body Bathing Details (indicate cue type and reason): simulated via posterior pericare from bed level     Lower Body Dressing: Total assistance;Sit to/from stand Lower Body Dressing Details (indicate cue type and reason): socks   Toilet Transfer Details (indicate cue type and reason): deferred d/t bowel incontinence Toileting- Clothing Manipulation and Hygiene: Total assistance Toileting - Clothing Manipulation Details (indicate cue type and reason): total A from standing for posterior and anterior pericare due to bowel incontinence     Functional mobility during ADLs: Moderate assistance General ADL Comments: pt able to complete seated exercises and extended bout of standing x1, and  standing to completed side steps to [facility], limited due to bowel incontinence     Vision       Perception     Praxis       Cognition Arousal/Alertness: Awake/alert Behavior During Therapy: WFL for tasks assessed/performed Overall Cognitive Status: No family/caregiver present to determine baseline cognitive functioning Area of Impairment: Safety/judgement;Awareness;Problem solving                   Current Attention Level: Selective     Safety/Judgement: Decreased awareness of safety Awareness: Emergent Problem Solving: Requires verbal cues General Comments: pt following commands, can be easily distracted at times but pleasant and cooperative.        Exercises            General Comments VSS on RA.    Pertinent Vitals/ Pain       Pain Assessment: Faces Faces Pain Scale: Hurts little more Pain Location: Buttocks with pericare Pain Descriptors / Indicators: Discomfort;Burning;Grimacing;Guarding Pain Intervention(s): Limited activity within patient's tolerance;Monitored during session;Repositioned         Frequency  Min 2X/week        Progress Toward Goals  OT Goals(current goals can now be found in the care plan section)  Progress towards OT goals: Progressing toward goals  Acute Rehab OT Goals OT Goal Formulation: With patient Time For Goal Achievement: 10/14/20 Potential to Achieve Goals: [place] Discharge plan remains appropriate;Frequency remains appropriate       AM-PAC OT "6 Clicks" Daily Activity     Outcome Measure   Help from another person eating meals?: None Help from another person taking care of personal grooming?: A Little Help from another person toileting, which includes using toliet, bedpan, or urinal?: A Lot Help from another person bathing (including washing, rinsing, drying)?: A Lot Help from another person to put on and taking off regular upper body clothing?: A Little Help from another person to put on and taking off regular lower body clothing?: Total 6 Click Score: 15    End of Session Equipment Utilized During Treatment: Rolling  walker  OT Visit Diagnosis: Unsteadiness on feet (R26.81);Other abnormalities of gait and mobility (R26.89);Muscle weakness (generalized) (M62.81);Pain Pain - part of body:  (buttocks)   Activity Tolerance Patient tolerated treatment well   Patient Left in bed;with call bell/phone within reach;with nursing/sitter in room   Nurse Communication Mobility status        Time: 2947-6546 OT Time Calculation (min): 29 min  Charges: OT General Charges $OT Visit: 1 Visit OT Treatments $Self Care/Home Management : 23-37 mins  [place]. Analisse Randle, COTA/L Acute Rehabilitation Services [place] 10/13/2020, 3:31 PM

## 2020-10-14 LAB — RENAL FUNCTION PANEL
Albumin: 2.7 g/dL — ABNORMAL LOW (ref 3.5–5.0)
Anion gap: 11 (ref 5–15)
BUN: 75 mg/dL — ABNORMAL HIGH (ref 8–23)
CO2: 25 mmol/L (ref 22–32)
Calcium: 8.7 mg/dL — ABNORMAL LOW (ref 8.9–10.3)
Chloride: 105 mmol/L (ref 98–111)
Creatinine, Ser: 3 mg/dL — ABNORMAL HIGH (ref 0.44–1.00)
GFR, Estimated: 17 mL/min — ABNORMAL LOW (ref >60)
Glucose, Bld: 84 mg/dL (ref 70–99)
Phosphorus: 4.3 mg/dL (ref 2.5–4.6)
Potassium: 3.6 mmol/L (ref 3.5–5.1)
Sodium: 141 mmol/L (ref 135–145)

## 2020-10-14 LAB — CBC
HCT: 29.6 % — ABNORMAL LOW (ref 36.0–46.0)
Hemoglobin: 9.5 g/dL — ABNORMAL LOW (ref 12.0–15.0)
MCH: 29.1 pg (ref 26.0–34.0)
MCHC: 32.1 g/dL (ref 30.0–36.0)
MCV: 90.8 fL (ref 80.0–100.0)
Platelets: 70 10*3/uL — ABNORMAL LOW (ref 150–400)
RBC: 3.26 MIL/uL — ABNORMAL LOW (ref 3.87–5.11)
RDW: 18.6 % — ABNORMAL HIGH (ref 11.5–15.5)
WBC: 2.8 10*3/uL — ABNORMAL LOW (ref 4.0–10.5)
nRBC: 0.7 % — ABNORMAL HIGH (ref 0.0–0.2)

## 2020-10-14 MED ORDER — TORSEMIDE 20 MG PO TABS
100.0000 mg | ORAL_TABLET | Freq: Two times a day (BID) | ORAL | Status: DC
  Administered 2020-10-14: 100 mg via ORAL
  Filled 2020-10-14: qty 5

## 2020-10-14 MED ORDER — POLYETHYLENE GLYCOL 3350 17 G PO PACK: 17.0000 g | PACK | Freq: Every day | ORAL | 0 refills | Status: AC | PRN

## 2020-10-14 NOTE — TOC Transition Note (Signed)
Transition of Care [facility]) - CM/SW Discharge Note   Patient Details  Name: Vanessa [name] MRN: [id number] Date of Birth: 1956/04/02  Transition of Care [facility] CM/SW Contact:  Trula Ore, [place] Phone Number: 10/14/2020, 1:48 PM   Clinical Narrative:     Patient will DC to: [facility] with Hospice Services to follow  Anticipated DC date: 10/14/2020  Family notified: Lobbyist by: Corey Harold   ?  Per MD patient ready for DC to [facility] with Hospice Services to follow . RN, patient, patient's family, [facility],and facility notified of DC. Discharge Summary sent to facility. RN given number for report tele#[phone] RM#[id number]. DC packet on chart.DNR signed by MD attached to DC packet on hard chart. Ambulance transport requested for patient.  CSW signing off.    Final next level of care: Skilled Nursing Facility Barriers to Discharge: No Barriers Identified   Patient Goals and CMS Choice Patient states their goals for this hospitalization and ongoing recovery are:: to go to SNF CMS Medicare.gov Compare Post Acute Care list provided to:: Patient Choice offered to / list presented to : Patient  Discharge Placement              Patient chooses bed at: [facility] (with hospice services to follow) Patient to be transferred to facility by: [place] Name of family member notified: [name] and family notified of of transfer: 10/14/20  Discharge Plan and Services In-house Referral: Clinical Social Work                                   Social Determinants of Health ([place]) Interventions     Readmission Risk Interventions Readmission Risk Prevention Plan 10/07/2020 11/08/2019 10/21/2019  Transportation Screening - Complete Complete  PCP or Specialist Appt within 3-5 Days - - -  Not Complete comments - - -  HRI or [place] Work Consult for [employer] - -  -  Medication Review [employer]) - Complete Referral to Pharmacy  PCP or Specialist appointment within 3-5 days of discharge - Complete Complete  HRI or Home Care Consult - Complete (No Data)  SW Recovery Care/Counseling Consult - Complete Complete  Palliative Care Screening Complete Not Applicable Not Applicable  Skilled Nursing Facility Complete Complete Complete  Some recent data might be hidden

## 2020-10-14 NOTE — Discharge Summary (Signed)
Physician Discharge Summary  Trianna Lupien OHY:[id number] DOB: 31-Dec-1955 DOA: 09/24/2020  PCP: Patient, No Pcp Per (Inactive)  Admit date: 09/24/2020 Discharge date: 10/14/2020  Admitted From: SNF Disposition: SNF with hospice  Recommendations for Outpatient Follow-up:  1. Follow up with SNF provider at earliest convenience 2. Outpatient follow-up with hospice at SNF at earliest convenience  3. follow up in ED if symptoms worsen or new appear   Home Health: No Equipment/Devices: None  Discharge Condition: Guarded to poor CODE STATUS: DNR Diet recommendation: Heart healthy/fluid restriction of up to 1200 cc a day  Brief/Interim Summary: 65 years old female withpast medical history ofchronic combined systolic and diastolic CHF(EF 35 to 94% by TTE 08/08/20), atrial fibrillation not on anticoagulation due to chronic bleeding from endometrial cancer, CKD stage IV, type II DM, hypertension, anemia of chronic disease, history of MSSA bacteremia, stage I endometrial cancer status post palliative radiation presented to thehospital from skilled nursing facility with complaints of shortness of breath and peripheral edema.  She was started on IV diuretics.  Cardiology and nephrology were consulted.  Renal function started to worsen with concern for cardiorenal syndrome.  Per nephrology, patient is not a candidate for hemodialysis.  Nephrology has recommended hospice care.  Palliative care was consulted.  Prior hospitalist discussed with Dr. Berline Lopes the patient's GYN oncologist on 10/12/2020 about the patient's prognosis with endometrial cancer. CT chest abdomen and pelvis ordered to assess for any metastasis.  Outpatient follow-up with OB/GYN.  Nephrology has switched her back to torsemide and has cleared her for discharge.  Patient/family have agreed for outpatient hospice follow-up.  She will be discharged to SNF today with hospice.  Discharge Diagnoses:   Acute on chronic combined systolic and  diastolic heart failure -Echo on 1/22 had shown EF of 35 to 40% -Initially treated with IV Lasix which was switched to torsemide but again switched to IV Lasix.  Nephrology was reconsulted on 10/12/2020 because patient had accidentally received IV contrast per radiology for CT of the chest/abdomen and pelvis -Nephrology has switched her back to torsemide 100 mg twice a day and cleared her for discharge.  Continue Coreg, hydralazine, isosorbide mononitrate as well.  Acute kidney injury on chronic any disease stage IV -Presented with creatinine of 4.3.  Creatinine 3 today.  Nephrology was reconsulted on 10/12/2020 as above.    Diuretic plan as above. -Per nephrology: Patient is not a candidate for hemodialysis.  Temporary hemodialysis catheter removed for nephrology  ESBL E. coli UTI -Treated with meropenem which was discontinued on 10/11/2020.  Hypokalemia -Improving.  Continue supplementation  Pancytopenia -Questionable cause.  Might be secondary to cancer as well.  No signs of bleeding.  History of endometrial cancer -CT chest abdomen and pelvis ordered to assess for any metastasis showed pulmonary metastases with bilateral pleural effusions, ascites and massive anasarca -OB/GYN follow-up appreciated  Paroxysmal A. Fib -Currently mostly rate controlled with Coreg.  Not on anticoagulation due to risk of bleeding  Essential hypertension -Blood pressure on the higher side intermittently.  Continue Coreg, hydralazine, torsemide, isosorbide mononitrate  Morbid obesity -Outpatient follow-up  Generalized conditioning -Overall prognosis is very poor.  Palliative care following.  Patient/family has agreed for outpatient hospice once she is discharged from the hospital.  Discharge Instructions  Discharge Instructions    Diet - low sodium heart healthy   Complete by: As directed    With fluid restriction of upto 1252ml per day   Increase activity slowly   Complete by: As directed  Allergies as of 10/14/2020      Reactions   Ace Inhibitors Other (See Comments)   Never prescribed but she has had angioedema   Angiotensin Receptor Blockers Other (See Comments)   ARB never Rxed but PMH  of angioedema in March 2021      Medication List    STOP taking these medications   diphenhydrAMINE 25 MG tablet Commonly known as: BENADRYL   loratadine 10 MG tablet Commonly known as: CLARITIN   nutrition supplement (JUVEN) Pack   Potassium Chloride ER 20 MEQ Tbcr     TAKE these medications   acetaminophen 500 MG tablet Commonly known as: TYLENOL Take 500 mg by mouth every 6 (six) hours as needed (pain).   aspirin 81 MG chewable tablet Chew 1 tablet (81 mg total) by mouth daily.   carvedilol 25 MG tablet Commonly known as: COREG Take 1 tablet (25 mg total) by mouth 2 (two) times daily with a meal.   Dermacloud Oint Apply 1 application topically See admin instructions. To buttocks bid and after incontinent episodes.   ferrous sulfate 325 (65 FE) MG tablet Take 1 tablet (325 mg total) by mouth 2 (two) times daily with a meal.   fluticasone 50 MCG/ACT nasal spray Commonly known as: FLONASE Place 1 spray into both nostrils daily as needed for allergies or rhinitis.   hydrALAZINE 50 MG tablet Commonly known as: APRESOLINE Take 1 tablet (50 mg total) by mouth every 8 (eight) hours.   hydrocerin Crea Apply 1 application topically daily.   hydrocortisone 2.5 % rectal cream Commonly known as: ANUSOL-HC Place 1 application rectally every 12 (twelve) hours as needed for hemorrhoids.   hydrOXYzine 25 MG tablet Commonly known as: ATARAX/VISTARIL Take 1 tablet (25 mg total) by mouth 3 (three) times daily as needed for itching.   isosorbide mononitrate 30 MG 24 hr tablet Commonly known as: IMDUR Take 1 tablet (30 mg total) by mouth daily.   nystatin powder Commonly known as: MYCOSTATIN/NYSTOP Apply 1 application topically See admin instructions. Under both  breasts daily after bath until resolved   polyethylene glycol 17 g packet Commonly known as: MIRALAX / GLYCOLAX Take 17 g by mouth daily as needed. What changed:   when to take this  reasons to take this   potassium chloride 10 MEQ tablet Commonly known as: KLOR-CON Take 10 mEq by mouth 2 (two) times daily.   torsemide 100 MG tablet Commonly known as: DEMADEX Take 100 mg by mouth 2 (two) times daily. What changed: Another medication with the same name was removed. Continue taking this medication, and follow the directions you see here.   witch hazel-glycerin pad Commonly known as: TUCKS Apply topically as needed for itching, hemorrhoids or irritation.       Follow-up Information    Richardson Dopp T, PA-C Follow up.   Specialties: Cardiology, Physician Assistant Why: [facility] (Cardiology) - [facility] location - a followup has been arranged for you on Tuesday Oct 20, 2020 3:15 PM (Arrive by 3:00 PM). Nicki Reaper is one of the PAs that works closely with our cardiology team. Contact information: [place]. [place] [address] [phone]        Dorothy Spark, MD. Schedule an appointment as soon as possible for a visit in 1 week(s).   Specialty: Cardiology Contact information: [address] [place] [address] [phone]              Allergies  Allergen  Reactions  . Ace Inhibitors Other (See Comments)    Never prescribed but she has had angioedema  . Angiotensin Receptor Blockers Other (See Comments)    ARB never Rxed but PMH  of angioedema in March 2021    Consultations:  Nephrology/palliative care/GYN oncology   Procedures/Studies: DG Chest 1 View  Result Date: 10/05/2020 CLINICAL DATA:  Chest pain and CHF EXAM: CHEST  1 VIEW COMPARISON:  09/24/2020 FINDINGS: Left sided dialysis catheter tip at mild right atrium. Patient rotated left. Mild cardiomegaly. Atherosclerosis in the transverse aorta. Layering  right pleural effusion is small and similar. No pneumothorax. Mild pulmonary venous congestion is new, accentuated by AP portable technique. Right greater than left base airspace disease is not significantly changed. IMPRESSION: Cardiomegaly with development of mild pulmonary venous congestion. Otherwise, similar right pleural effusion with bibasilar Airspace disease, likely atelectasis. Left-sided dialysis catheter at mid right atrium, without pneumothorax. Aortic Atherosclerosis (ICD10-I70.0). Electronically Signed   By: [name] Miyamoto M.D.   On: 10/05/2020 12:17   DG Chest 2 View  Result Date: 09/24/2020 CLINICAL DATA:  Chest pain, shortness of breath and weakness. EXAM: CHEST - 2 VIEW COMPARISON:  August 07, 2020 FINDINGS: Decreased lung volumes are noted. Mild atelectasis and/or infiltrate is seen within the right lung base. There is a small right pleural effusion. The cardiac silhouette is markedly enlarged and unchanged in size. Degenerative changes seen throughout the thoracic spine and bilateral shoulders. IMPRESSION: 1. Stable cardiomegaly with mild right basilar atelectasis and/or infiltrate. 2. Small right pleural effusion. Electronically Signed   By: Virgina Norfolk M.D.   On: 09/24/2020 15:23   US RENAL  Result Date: 10/03/2020 CLINICAL DATA:  Acute kidney injury. EXAM: RENAL / URINARY TRACT ULTRASOUND COMPLETE COMPARISON:  Renal ultrasound 08/09/2020.  CT 11/07/2019 FINDINGS: Right Kidney: Renal measurements: 8.8 x 4.1 x 4.4 cm = volume: 84 mL. No hydronephrosis. There is increased renal parenchymal echogenicity. No evidence of focal lesion or stone. Left Kidney: Renal measurements: 6.7 x 4.1 x 3.3 cm = volume: 47 mL. Kidney is chronically atrophic, difficult to visualize with ultrasound. No evidence of hydronephrosis or focal renal abnormality. Bladder: Partially distended. Appears normal for degree of bladder distention. Other: Moderate volume abdominopelvic ascites. Technically limited  exam due to habitus. IMPRESSION: 1. Chronic left renal atrophy. 2. No obstructive uropathy. Mild increased right renal parenchymal echogenicity most consistent with chronic medical renal disease. 3. Moderate abdominopelvic ascites. Electronically Signed   By: Keith Rake M.D.   On: 10/03/2020 16:26   CT CHEST ABDOMEN PELVIS W CONTRAST  Result Date: 10/12/2020 CLINICAL DATA:  Endometrial cancer staging EXAM: CT CHEST, ABDOMEN, AND PELVIS WITH CONTRAST TECHNIQUE: Multidetector CT imaging of the chest, abdomen and pelvis was performed following the standard protocol during bolus administration of intravenous contrast. CONTRAST:  127mL OMNIPAQUE IOHEXOL 300 MG/ML  SOLN COMPARISON:  11/07/2019 FINDINGS: CT CHEST FINDINGS Cardiovascular: Scattered aortic atherosclerosis. Cardiomegaly. No pericardial effusion. Mediastinum/Nodes: No enlarged mediastinal, hilar, or axillary lymph nodes. Thyroid gland, trachea, and esophagus demonstrate no significant findings. Lungs/Pleura: Moderate right, small left pleural effusions and associated atelectasis or consolidation. Pulmonary nodule of the anterior inferior right upper lobe measuring 2.1 x 1.6 cm (series 4, image 76). Pulmonary nodule of the superior segment left lower lobe measuring 1.7 x 1.6 cm (series 4, image 78). Musculoskeletal: No chest wall mass or suspicious bone lesions identified. CT ABDOMEN PELVIS FINDINGS Hepatobiliary: Heterogeneous contrast enhancement of the liver parenchyma. No focal liver lesion. No gallstones, gallbladder wall thickening, or  biliary dilatation. Pancreas: Unremarkable. No pancreatic ductal dilatation or surrounding inflammatory changes. Spleen: Normal in size without significant abnormality. Adrenals/Urinary Tract: Adrenal glands are unremarkable. Markedly atrophic left kidney. The right kidney is normal in appearance. No hydronephrosis. Bladder is unremarkable. Stomach/Bowel: Stomach is within normal limits. Appendix appears normal. No  evidence of bowel wall thickening, distention, or inflammatory changes. Vascular/Lymphatic: No significant vascular findings are present. No enlarged abdominal or pelvic lymph nodes. Reproductive: No mass or other abnormality. Other: Severe anasarca. Large volume ascites throughout the abdomen and pelvis. Musculoskeletal: No acute or significant osseous findings. IMPRESSION: 1. Pulmonary nodules of the anterior inferior right upper lobe and superior segment left lower lobe measuring up to 2.1 cm. These are highly concerning for pulmonary metastatic disease. 2. Moderate right, small left pleural effusions and associated atelectasis or consolidation. 3. Large volume ascites throughout the abdomen and pelvis without specific evidence of metastatic disease. No significant lymphadenopathy or peritoneal nodularity appreciated. 4. No pelvic mass appreciated. 5. Heterogeneous contrast enhancement of the liver parenchyma, suggesting passive hepatic congestion and/or chronic liver disease. 6. Markedly atrophic left kidney. 7. Severe anasarca. Aortic Atherosclerosis (ICD10-I70.0). Electronically Signed   By: Eddie Candle M.D.   On: 10/12/2020 15:58   IR Fluoro Guide CV Line Left  Result Date: 10/04/2020 INDICATION: 65 year old with acute on chronic kidney disease. EXAM: FLUOROSCOPIC AND ULTRASOUND GUIDED PLACEMENT OF A NON-TUNNELED DIALYSIS CATHETER Physician: Stephan Minister. Henn, MD MEDICATIONS: None ANESTHESIA/SEDATION: None FLUOROSCOPY TIME:  Fluoroscopy Time: 1 minutes, 42 seconds, 13 mGy COMPLICATIONS: None immediate. PROCEDURE: The procedure was explained to the patient. The risks and benefits of the procedure were discussed and the patient's questions were addressed. Informed consent was obtained from the patient. The patient was placed supine on the interventional table. Ultrasound confirmed a patent right internal jugular vein. Ultrasound images were obtained for documentation. The right neck was prepped and draped in a  sterile fashion. The right neck was anesthetized with 1% lidocaine. Maximal barrier sterile technique was utilized including caps, mask, sterile gowns, sterile gloves, sterile drape, hand hygiene and skin antiseptic. A small incision was made with #11 blade scalpel. A 21 gauge needle directed into the right internal jugular vein with ultrasound guidance. A micropuncture dilator set was placed. Wire would not advance centrally. Micropuncture dilator set was removed and a bandage was placed at the puncture site. Ultrasound confirmed a patent left internal jugular vein. Ultrasound image was saved for documentation. Left side of the neck was prepped and draped in sterile fashion. Maximal barrier sterile technique was utilized including caps, mask, sterile gowns, sterile gloves, sterile drape, hand hygiene and skin antiseptic. Skin was anesthetized using 1% lidocaine. Small incision was made. Using ultrasound guidance, 21 gauge needle was directed into the left internal jugular vein and wire easily advanced centrally. Micropuncture dilator set was placed. J wire was placed. Tract was dilated. A 20 cm Mahurkar catheter was selected. The catheter was advanced over a wire and positioned at the superior cavoatrial junction. Fluoroscopic images were obtained for documentation. Both dialysis lumens were found to aspirate and flush well. The proper amount of heparin was flushed in both lumens. The central venous lumen was flushed with normal saline. Catheter was sutured to skin. FINDINGS: 1. Right internal jugular vein is patent but there is peripheral wall thickening suggesting old thrombus in this area. Wire would not advance centrally from the right internal jugular vein and not clear if there is a central occlusion or possibly septations from old thrombus. 2. Left  internal jugular vein is widely patent. 3. Catheter tip is near the superior cavoatrial junction. IMPRESSION: Successful placement of a left jugular non-tunneled  dialysis catheter using ultrasound and fluoroscopic guidance. Electronically Signed   By: Markus Daft M.D.   On: 10/04/2020 13:44   IR US Guide Vasc Access Left  Result Date: 10/04/2020 INDICATION: 65 year old with acute on chronic kidney disease. EXAM: FLUOROSCOPIC AND ULTRASOUND GUIDED PLACEMENT OF A NON-TUNNELED DIALYSIS CATHETER Physician: Stephan Minister. Henn, MD MEDICATIONS: None ANESTHESIA/SEDATION: None FLUOROSCOPY TIME:  Fluoroscopy Time: 1 minutes, 42 seconds, 13 mGy COMPLICATIONS: None immediate. PROCEDURE: The procedure was explained to the patient. The risks and benefits of the procedure were discussed and the patient's questions were addressed. Informed consent was obtained from the patient. The patient was placed supine on the interventional table. Ultrasound confirmed a patent right internal jugular vein. Ultrasound images were obtained for documentation. The right neck was prepped and draped in a sterile fashion. The right neck was anesthetized with 1% lidocaine. Maximal barrier sterile technique was utilized including caps, mask, sterile gowns, sterile gloves, sterile drape, hand hygiene and skin antiseptic. A small incision was made with #11 blade scalpel. A 21 gauge needle directed into the right internal jugular vein with ultrasound guidance. A micropuncture dilator set was placed. Wire would not advance centrally. Micropuncture dilator set was removed and a bandage was placed at the puncture site. Ultrasound confirmed a patent left internal jugular vein. Ultrasound image was saved for documentation. Left side of the neck was prepped and draped in sterile fashion. Maximal barrier sterile technique was utilized including caps, mask, sterile gowns, sterile gloves, sterile drape, hand hygiene and skin antiseptic. Skin was anesthetized using 1% lidocaine. Small incision was made. Using ultrasound guidance, 21 gauge needle was directed into the left internal jugular vein and wire easily advanced  centrally. Micropuncture dilator set was placed. J wire was placed. Tract was dilated. A 20 cm Mahurkar catheter was selected. The catheter was advanced over a wire and positioned at the superior cavoatrial junction. Fluoroscopic images were obtained for documentation. Both dialysis lumens were found to aspirate and flush well. The proper amount of heparin was flushed in both lumens. The central venous lumen was flushed with normal saline. Catheter was sutured to skin. FINDINGS: 1. Right internal jugular vein is patent but there is peripheral wall thickening suggesting old thrombus in this area. Wire would not advance centrally from the right internal jugular vein and not clear if there is a central occlusion or possibly septations from old thrombus. 2. Left internal jugular vein is widely patent. 3. Catheter tip is near the superior cavoatrial junction. IMPRESSION: Successful placement of a left jugular non-tunneled dialysis catheter using ultrasound and fluoroscopic guidance. Electronically Signed   By: Markus Daft M.D.   On: 10/04/2020 13:44   DG CHEST PORT 1 VIEW  Result Date: 10/10/2020 CLINICAL DATA:  Central line placement. EXAM: PORTABLE CHEST 1 VIEW COMPARISON:  10/05/2020 FINDINGS: Left IJ dialysis catheter unchanged with tip over the right atrium. Lungs are adequately inflated with stable hazy opacification over the right mid to lower lung and left base suggesting layering effusions right worse than left with associated bibasilar atelectasis. Mild stable cardiomegaly. Remainder of the exam is unchanged. IMPRESSION: 1. Stable bilateral pleural effusions right greater than left with associated bibasilar atelectasis. 2. Left IJ dialysis catheter unchanged with tip over the right atrium. Electronically Signed   By: Marin Olp M.D.   On: 10/10/2020 08:38  Subjective: Patient seen and examined at bedside.  No fever, vomiting reported.  Poor historian.  Discharge Exam: Vitals:   10/14/20  0823 10/14/20 1153  BP: 121/84 117/86  Pulse: (!) 101 (!) 103  Resp: 12 16  Temp: (!) 97.4 F (36.3 C) (!) 97.2 F (36.2 C)  SpO2: 100% 97%   General exam: No acute distress.  Chronically ill looking and deconditioned.  Currently on room air Respiratory system: Decreased breath sounds at bases bilaterally with basilar crackles  cardiovascular system: Intermittent bradycardia and tachycardia; S1-S2 heard Gastrointestinal system: Abdomen is slightly distended, soft and nontender.  Bowel sounds are heard Extremities: Mild lower extremity edema present; no clubbing Central nervous system: Sleepy, hardly wakes up on calling her name.  Poor historian. No focal neurological deficits.  Moves extremities  skin: No obvious ecchymosis/lesions chronic lower extremity skin changes present Psychiatry: Affect is flat    The results of significant diagnostics from this hospitalization (including imaging, microbiology, ancillary and laboratory) are listed below for reference.     Microbiology: Recent Results (from the past 240 hour(s))  SARS CORONAVIRUS 2 (TAT 6-24 HRS) Nasopharyngeal Nasopharyngeal Swab     Status: None   Collection Time: 10/13/20  5:17 PM   Specimen: Nasopharyngeal Swab  Result Value Ref Range Status   SARS Coronavirus 2 NEGATIVE NEGATIVE Final    Comment: (NOTE) SARS-CoV-2 target nucleic acids are NOT DETECTED.  The SARS-CoV-2 RNA is generally detectable in upper and lower respiratory specimens during the acute phase of infection. Negative results do not preclude SARS-CoV-2 infection, do not rule out co-infections with other pathogens, and should not be used as the sole basis for treatment or other patient management decisions. Negative results must be combined with clinical observations, patient history, and epidemiological information. The expected result is Negative.  Fact Sheet for Patients: SugarRoll.be  Fact Sheet for Healthcare  Providers: https://www.woods-mathews.com/  This test is not yet approved or cleared by the [place] FDA and  has been authorized for detection and/or diagnosis of SARS-CoV-2 by FDA under an Emergency Use Authorization (EUA). This EUA will remain  in effect (meaning this test can be used) for the duration of the COVID-19 declaration under Se ction 564(b)(1) of the Act, 21 U.S.C. section 360bbb-3(b)(1), unless the authorization is terminated or revoked sooner.  Performed at [facility], [place] [address] [place], [place] [address]      Labs: BNP (last 3 results) Recent Labs    08/07/20 1736 09/24/20 1447 10/02/20 0305  BNP 2,227.0* 1,724.9* 6,301.6*   Basic Metabolic Panel: Recent Labs  Lab 10/09/20 0631 10/10/20 0856 10/11/20 0743 10/12/20 0844 10/13/20 0332 10/14/20 0243  NA 140 135 139 142 140 141  K 3.1* 2.8* 3.3* 3.3* 3.6 3.6  CL 101 100 104 103 104 105  CO2 30 25 26 27 26 25   GLUCOSE 72 158* 83 83 87 84  BUN 46* 53* 57* 65* 68* 75*  CREATININE 2.35* 2.77* 2.85* 2.93* 2.94* 3.00*  CALCIUM 8.4* 8.0* 8.5* 8.8* 8.8* 8.7*  MG 1.9  --   --   --   --   --   PHOS 3.4 3.6  --  4.2 4.3 4.3   Liver Function Tests: Recent Labs  Lab 10/10/20 0856 10/11/20 0743 10/12/20 0844 10/13/20 0332 10/14/20 0243  AST  --  13*  --   --   --   ALT  --  10  --   --   --   Arabella Merles  --  64  --   --   --   BILITOT  --  0.8  --   --   --   PROT  --  6.9  --   --   --   ALBUMIN 2.6* 2.7* 2.7* 2.9* 2.7*   No results for input(s): LIPASE, AMYLASE in the last 168 hours. No results for input(s): AMMONIA in the last 168 hours. CBC: Recent Labs  Lab 10/09/20 0631 10/11/20 0743 10/12/20 0844 10/13/20 0332 10/14/20 0243  WBC 3.3* 3.0* 3.2* 3.3* 2.8*  HGB 10.5* 10.3* 10.2* 10.1* 9.5*  HCT 33.4* 33.2* 32.6* 32.6* 29.6*  MCV 90.0 91.5 91.6 89.8 90.8  PLT 51* 64* 68* 73* 70*   Cardiac Enzymes: No results for input(s): CKTOTAL, CKMB, CKMBINDEX, TROPONINI in  the last 168 hours. BNP: Invalid input(s): POCBNP CBG: Recent Labs  Lab 10/11/20 1618  GLUCAP 116*   D-Dimer No results for input(s): DDIMER in the last 72 hours. Hgb A1c No results for input(s): HGBA1C in the last 72 hours. Lipid Profile No results for input(s): CHOL, HDL, LDLCALC, TRIG, CHOLHDL, LDLDIRECT in the last 72 hours. Thyroid function studies No results for input(s): TSH, T4TOTAL, T3FREE, THYROIDAB in the last 72 hours.  Invalid input(s): FREET3 Anemia work up No results for input(s): VITAMINB12, FOLATE, FERRITIN, TIBC, IRON, RETICCTPCT in the last 72 hours. Urinalysis    Component Value Date/Time   COLORURINE YELLOW 10/02/2020 1719   APPEARANCEUR TURBID (A) 10/02/2020 1719   LABSPEC 1.012 10/02/2020 1719   PHURINE 5.0 10/02/2020 1719   GLUCOSEU NEGATIVE 10/02/2020 1719   HGBUR MODERATE (A) 10/02/2020 1719   BILIRUBINUR NEGATIVE 10/02/2020 1719   KETONESUR NEGATIVE 10/02/2020 1719   PROTEINUR 100 (A) 10/02/2020 1719   UROBILINOGEN 0.2 03/06/2014 1644   NITRITE NEGATIVE 10/02/2020 1719   LEUKOCYTESUR LARGE (A) 10/02/2020 1719   Sepsis Labs Invalid input(s): PROCALCITONIN,  WBC,  LACTICIDVEN Microbiology Recent Results (from the past 240 hour(s))  SARS CORONAVIRUS 2 (TAT 6-24 HRS) Nasopharyngeal Nasopharyngeal Swab     Status: None   Collection Time: 10/13/20  5:17 PM   Specimen: Nasopharyngeal Swab  Result Value Ref Range Status   SARS Coronavirus 2 NEGATIVE NEGATIVE Final    Comment: (NOTE) SARS-CoV-2 target nucleic acids are NOT DETECTED.  The SARS-CoV-2 RNA is generally detectable in upper and lower respiratory specimens during the acute phase of infection. Negative results do not preclude SARS-CoV-2 infection, do not rule out co-infections with other pathogens, and should not be used as the sole basis for treatment or other patient management decisions. Negative results must be combined with clinical observations, patient history, and  epidemiological information. The expected result is Negative.  Fact Sheet for Patients: SugarRoll.be  Fact Sheet for Healthcare Providers: https://www.woods-mathews.com/  This test is not yet approved or cleared by the [place] FDA and  has been authorized for detection and/or diagnosis of SARS-CoV-2 by FDA under an Emergency Use Authorization (EUA). This EUA will remain  in effect (meaning this test can be used) for the duration of the COVID-19 declaration under Se ction 564(b)(1) of the Act, 21 U.S.C. section 360bbb-3(b)(1), unless the authorization is terminated or revoked sooner.  Performed at [facility], [place] [address] [place], [place] [address]      Time coordinating discharge: 35 minutes  SIGNED:   Aline August, MD  [facility] 10/14/2020, 1:30 PM

## 2020-10-14 NOTE — Progress Notes (Signed)
Patient ID: Vanessa [name], female   DOB: October 24, 1955, 65 y.o.   MRN: [id number]  PROGRESS NOTE    Vanessa [name]  ZTI:[id number] DOB: 1956-01-03 DOA: 09/24/2020 PCP: Patient, No Pcp Per (Inactive)   Brief Narrative:  65 years old female withpast medical history ofchronic combined systolic and diastolic CHF(EF 35 to 82% by TTE 08/08/20), atrial fibrillation not on anticoagulation due to chronic bleeding from endometrial cancer, CKD stage IV, type II DM, hypertension, anemia of chronic disease, history of MSSA bacteremia, stage I endometrial cancer status post palliative radiation presented to thehospital from skilled nursing facility with complaints of shortness of breath and peripheral edema.  She was started on IV diuretics.  Cardiology and nephrology were consulted.  Renal function started to worsen with concern for cardiorenal syndrome.  Per nephrology, patient is not a candidate for hemodialysis.  Nephrology has recommended hospice care.  Palliative care was consulted.  Prior hospitalist discussed  with Dr. Berline Lopes the patient's GYN oncologist on 10/12/2020 about the patient's prognosis with endometrial cancer.  CT chest abdomen and pelvis ordered to assess for any metastasis.  Outpatient follow-up with OB/GYN   Assessment & Plan:   Acute on chronic combined systolic and diastolic heart failure -Echo on 1/22 had shown EF of 35 to 40% -Initially treated with IV Lasix which was switched to torsemide but again switched to IV Lasix.  Nephrology was reconsulted on 10/12/2020 because patient had accidentally received IV contrast per radiology for CT of the chest/abdomen and pelvis -Monitor renal function -Strict input and output.  Daily weights.  Fluid restriction.  Continue Coreg, hydralazine, isosorbide mononitrate as well.  Acute kidney injury on chronic any disease stage IV -Presented with creatinine of 4.3.  Creatinine 3 today.  Nephrology was reconsulted on 10/12/2020 as above.  Currently on IV  Lasix. -Per nephrology: Patient is not a candidate for hemodialysis.  Temporary hemodialysis catheter removed for nephrology  ESBL E. coli UTI -Treated with meropenem which was discontinued on 10/11/2020.  Hypokalemia -Improving  Pancytopenia -Questionable cause.  Might be secondary to cancer as well.  No signs of bleeding.  History of endometrial cancer -CT chest abdomen and pelvis ordered to assess for any metastasis showed pulmonary metastases with bilateral pleural effusions, ascites and massive anasarca -OB/GYN follow-up appreciated  Paroxysmal A. Fib -Currently mostly rate controlled with Coreg.  Not on anticoagulation due to risk of bleeding  Essential hypertension -Blood pressure on the higher side intermittently.  Continue Coreg, hydralazine, Lasix, isosorbide mononitrate  Morbid obesity -Outpatient follow-up  Generalized conditioning -Overall prognosis is very poor.  Palliative care following.  Patient/family has agreed for outpatient hospice once she is discharged from the hospital.  DVT prophylaxis: Subcutaneous heparin Code Status: DNR Family Communication: None at bedside Disposition Plan: Status is: Inpatient  Remains inpatient appropriate because:Inpatient level of care appropriate due to severity of illness.  Still on intravenous Lasix.  Possible discharge in 1 to 2 days if cleared by nephrology   Dispo:  Patient From: Home  Planned Disposition: [place] [facility] with hospice   medically stable for discharge: No  Consultants: Nephrology/palliative care/GYN oncology  Procedures: Hemodialysis catheter placement and removal  Antimicrobials:  Anti-infectives (From admission, onward)   Start     Dose/Rate Route Frequency Ordered Stop   10/06/20 1800  meropenem (MERREM) 500 mg in sodium chloride 0.9 % 100 mL IVPB  Status:  Discontinued        500 mg 200 mL/hr over 30 Minutes Intravenous Every 24 hours  10/06/20 1704 10/13/20 1122   10/06/20 1200   fosfomycin (MONUROL) packet 3 g        3 g Oral  Once 10/06/20 1109 10/06/20 1200   10/03/20 0930  cefTRIAXone (ROCEPHIN) 1 g in sodium chloride 0.9 % 100 mL IVPB  Status:  Discontinued        1 g 200 mL/hr over 30 Minutes Intravenous Every 24 hours 10/03/20 7681 10/06/20 1109       Subjective: Patient seen and examined at bedside.  No fever, vomiting reported.  Poor historian. Objective: Vitals:   10/13/20 1100 10/13/20 1645 10/13/20 2118 10/14/20 0355  BP: 118/83 137/85 (!) 155/93 (!) 137/105  Pulse: (!) 104 (!) 59  100  Resp: 14 15 11 13   Temp:  (!) 97.4 F (36.3 C)    TempSrc:  Oral    SpO2: 99% 100%  98%  Weight:      Height:        Intake/Output Summary (Last 24 hours) at 10/14/2020 0732 Last data filed at 10/14/2020 0357 Gross per 24 hour  Intake 1232.24 ml  Output --  Net 1232.24 ml   Filed Weights   10/11/20 0407 10/12/20 0102 10/13/20 0403  Weight: 93.7 kg 94.3 kg 93.5 kg    Examination:  General exam: No acute distress.  Chronically ill looking and deconditioned.  Currently on room air Respiratory system: Decreased breath sounds at bases bilaterally with basilar crackles  cardiovascular system: Intermittent bradycardia and tachycardia; S1-S2 heard Gastrointestinal system: Abdomen is slightly distended, soft and nontender.  Bowel sounds are heard Extremities: Mild lower extremity edema present; no clubbing Central nervous system: Sleepy, hardly wakes up on calling her name.  Poor historian. No focal neurological deficits.  Moves extremities  skin: No obvious ecchymosis/lesions chronic lower extremity skin changes present Psychiatry: Affect is flat  Data Reviewed: I have personally reviewed following labs and imaging studies  CBC: Recent Labs  Lab 10/09/20 0631 10/11/20 0743 10/12/20 0844 10/13/20 0332 10/14/20 0243  WBC 3.3* 3.0* 3.2* 3.3* 2.8*  HGB 10.5* 10.3* 10.2* 10.1* 9.5*  HCT 33.4* 33.2* 32.6* 32.6* 29.6*  MCV 90.0 91.5 91.6 89.8 90.8  PLT  51* 64* 68* 73* 70*   Basic Metabolic Panel: Recent Labs  Lab 10/09/20 0631 10/10/20 0856 10/11/20 0743 10/12/20 0844 10/13/20 0332 10/14/20 0243  NA 140 135 139 142 140 141  K 3.1* 2.8* 3.3* 3.3* 3.6 3.6  CL 101 100 104 103 104 105  CO2 30 25 26 27 26 25   GLUCOSE 72 158* 83 83 87 84  BUN 46* 53* 57* 65* 68* 75*  CREATININE 2.35* 2.77* 2.85* 2.93* 2.94* 3.00*  CALCIUM 8.4* 8.0* 8.5* 8.8* 8.8* 8.7*  MG 1.9  --   --   --   --   --   PHOS 3.4 3.6  --  4.2 4.3 4.3   GFR: Estimated Creatinine Clearance: 18.4 mL/min (A) (by C-G formula based on SCr of 3 mg/dL (H)). Liver Function Tests: Recent Labs  Lab 10/10/20 0856 10/11/20 0743 10/12/20 0844 10/13/20 0332 10/14/20 0243  AST  --  13*  --   --   --   ALT  --  10  --   --   --   ALKPHOS  --  64  --   --   --   BILITOT  --  0.8  --   --   --   PROT  --  6.9  --   --   --  ALBUMIN 2.6* 2.7* 2.7* 2.9* 2.7*   No results for input(s): LIPASE, AMYLASE in the last 168 hours. No results for input(s): AMMONIA in the last 168 hours. Coagulation Profile: No results for input(s): INR, PROTIME in the last 168 hours. Cardiac Enzymes: No results for input(s): CKTOTAL, CKMB, CKMBINDEX, TROPONINI in the last 168 hours. BNP (last 3 results) No results for input(s): PROBNP in the last 8760 hours. HbA1C: Recent Labs    10/11/20 0743  HGBA1C 5.5   CBG: Recent Labs  Lab 10/11/20 1618  GLUCAP 116*   Lipid Profile: No results for input(s): CHOL, HDL, LDLCALC, TRIG, CHOLHDL, LDLDIRECT in the last 72 hours. Thyroid Function Tests: No results for input(s): TSH, T4TOTAL, FREET4, T3FREE, THYROIDAB in the last 72 hours. Anemia Panel: No results for input(s): VITAMINB12, FOLATE, FERRITIN, TIBC, IRON, RETICCTPCT in the last 72 hours. Sepsis Labs: No results for input(s): PROCALCITON, LATICACIDVEN in the last 168 hours.  Recent Results (from the past 240 hour(s))  SARS CORONAVIRUS 2 (TAT 6-24 HRS) Nasopharyngeal Nasopharyngeal Swab      Status: None   Collection Time: 10/13/20  5:17 PM   Specimen: Nasopharyngeal Swab  Result Value Ref Range Status   SARS Coronavirus 2 NEGATIVE NEGATIVE Final    Comment: (NOTE) SARS-CoV-2 target nucleic acids are NOT DETECTED.  The SARS-CoV-2 RNA is generally detectable in upper and lower respiratory specimens during the acute phase of infection. Negative results do not preclude SARS-CoV-2 infection, do not rule out co-infections with other pathogens, and should not be used as the sole basis for treatment or other patient management decisions. Negative results must be combined with clinical observations, patient history, and epidemiological information. The expected result is Negative.  Fact Sheet for Patients: SugarRoll.be  Fact Sheet for Healthcare Providers: https://www.woods-mathews.com/  This test is not yet approved or cleared by the [place] FDA and  has been authorized for detection and/or diagnosis of SARS-CoV-2 by FDA under an Emergency Use Authorization (EUA). This EUA will remain  in effect (meaning this test can be used) for the duration of the COVID-19 declaration under Se ction 564(b)(1) of the Act, 21 U.S.C. section 360bbb-3(b)(1), unless the authorization is terminated or revoked sooner.  Performed at [facility], [place] [address], [place], [place] [address]          Radiology Studies: CT CHEST ABDOMEN PELVIS W CONTRAST  Result Date: 10/12/2020 CLINICAL DATA:  Endometrial cancer staging EXAM: CT CHEST, ABDOMEN, AND PELVIS WITH CONTRAST TECHNIQUE: Multidetector CT imaging of the chest, abdomen and pelvis was performed following the standard protocol during bolus administration of intravenous contrast. CONTRAST:  144mL OMNIPAQUE IOHEXOL 300 MG/ML  SOLN COMPARISON:  11/07/2019 FINDINGS: CT CHEST FINDINGS Cardiovascular: Scattered aortic atherosclerosis. Cardiomegaly. No pericardial effusion. Mediastinum/Nodes:  No enlarged mediastinal, hilar, or axillary lymph nodes. Thyroid gland, trachea, and esophagus demonstrate no significant findings. Lungs/Pleura: Moderate right, small left pleural effusions and associated atelectasis or consolidation. Pulmonary nodule of the anterior inferior right upper lobe measuring 2.1 x 1.6 cm (series 4, image 76). Pulmonary nodule of the superior segment left lower lobe measuring 1.7 x 1.6 cm (series 4, image 78). Musculoskeletal: No chest wall mass or suspicious bone lesions identified. CT ABDOMEN PELVIS FINDINGS Hepatobiliary: Heterogeneous contrast enhancement of the liver parenchyma. No focal liver lesion. No gallstones, gallbladder wall thickening, or biliary dilatation. Pancreas: Unremarkable. No pancreatic ductal dilatation or surrounding inflammatory changes. Spleen: Normal in size without significant abnormality. Adrenals/Urinary Tract: Adrenal glands are unremarkable. Markedly atrophic left kidney.  The right kidney is normal in appearance. No hydronephrosis. Bladder is unremarkable. Stomach/Bowel: Stomach is within normal limits. Appendix appears normal. No evidence of bowel wall thickening, distention, or inflammatory changes. Vascular/Lymphatic: No significant vascular findings are present. No enlarged abdominal or pelvic lymph nodes. Reproductive: No mass or other abnormality. Other: Severe anasarca. Large volume ascites throughout the abdomen and pelvis. Musculoskeletal: No acute or significant osseous findings. IMPRESSION: 1. Pulmonary nodules of the anterior inferior right upper lobe and superior segment left lower lobe measuring up to 2.1 cm. These are highly concerning for pulmonary metastatic disease. 2. Moderate right, small left pleural effusions and associated atelectasis or consolidation. 3. Large volume ascites throughout the abdomen and pelvis without specific evidence of metastatic disease. No significant lymphadenopathy or peritoneal nodularity appreciated. 4. No  pelvic mass appreciated. 5. Heterogeneous contrast enhancement of the liver parenchyma, suggesting passive hepatic congestion and/or chronic liver disease. 6. Markedly atrophic left kidney. 7. Severe anasarca. Aortic Atherosclerosis (ICD10-I70.0). Electronically Signed   By: Eddie Candle M.D.   On: 10/12/2020 15:58        Scheduled Meds: . carvedilol  25 mg Oral BID WC  . Chlorhexidine Gluconate Cloth  6 each Topical Q0600  . ferrous sulfate  325 mg Oral BID WC  . heparin injection (subcutaneous)  5,000 Units Subcutaneous Q8H  . hydrALAZINE  50 mg Oral Q8H  . hydrocortisone cream   Topical BID  . isosorbide mononitrate  30 mg Oral Daily  . nutrition supplement (JUVEN)  1 packet Oral BID BM  . potassium chloride SA  10 mEq Oral BID  . sodium chloride flush  3 mL Intravenous Q12H   Continuous Infusions: . furosemide 160 mg (10/14/20 0340)          Aline August, MD [facility] 10/14/2020, 7:32 AM

## 2020-10-14 NOTE — Progress Notes (Signed)
Patient ID: Vanessa [name], female   DOB: 05-Aug-1955, 65 y.o.   MRN: [id number]    HPI: Desires to go home with hospice.  On IV Lasix- will switch to oral for comfort/ anasarca  . O:BP 117/86 (BP Location: Left Arm)   Pulse (!) 103   Temp (!) 97.2 F (36.2 C) (Oral)   Resp 16   Ht 4\' 11"  (1.499 m)   Wt 93.5 kg   LMP 03/17/2015   SpO2 97%   BMI 41.63 kg/m   Intake/Output Summary (Last 24 hours) at 10/14/2020 1212 Last data filed at 10/14/2020 1000 Gross per 24 hour  Intake 1112.24 ml  Output --  Net 1112.24 ml   Intake/Output: I/O last 3 completed shifts: In: 1448.2 [P.O.:1060; IV Piggyback:388.2] Out: 100 [Urine:100]  Intake/Output this shift:  Total I/O In: 360 [P.O.:360] Out: -  Weight change:  Gen: NAD, drowsy HEENT: some dependent edema L cheek CVS: irregular Resp: CTA Abd: distended, +fluid wave, nontender Ext: 2+ anasarca to thighs  Recent Labs  Lab 10/08/20 0237 10/09/20 0631 10/10/20 0856 10/11/20 0743 10/12/20 0844 10/13/20 0332 10/14/20 0243  NA 141 140 135 139 142 140 141  K 3.5 3.1* 2.8* 3.3* 3.3* 3.6 3.6  CL 105 101 100 104 103 104 105  CO2 27 30 25 26 27 26 25   GLUCOSE 68* 72 158* 83 83 87 84  BUN 61* 46* 53* 57* 65* 68* 75*  CREATININE 2.69* 2.35* 2.77* 2.85* 2.93* 2.94* 3.00*  ALBUMIN  --   --  2.6* 2.7* 2.7* 2.9* 2.7*  CALCIUM 8.5* 8.4* 8.0* 8.5* 8.8* 8.8* 8.7*  PHOS  --  3.4 3.6  --  4.2 4.3 4.3  AST  --   --   --  13*  --   --   --   ALT  --   --   --  10  --   --   --    Liver Function Tests: Recent Labs  Lab 10/11/20 0743 10/12/20 0844 10/13/20 0332 10/14/20 0243  AST 13*  --   --   --   ALT 10  --   --   --   ALKPHOS 64  --   --   --   BILITOT 0.8  --   --   --   PROT 6.9  --   --   --   ALBUMIN 2.7* 2.7* 2.9* 2.7*   No results for input(s): LIPASE, AMYLASE in the last 168 hours. No results for input(s): AMMONIA in the last 168 hours. CBC: Recent Labs  Lab 10/09/20 0631 10/11/20 0743 10/12/20 0844 10/13/20 0332  10/14/20 0243  WBC 3.3* 3.0* 3.2* 3.3* 2.8*  HGB 10.5* 10.3* 10.2* 10.1* 9.5*  HCT 33.4* 33.2* 32.6* 32.6* 29.6*  MCV 90.0 91.5 91.6 89.8 90.8  PLT 51* 64* 68* 73* 70*   Cardiac Enzymes: No results for input(s): CKTOTAL, CKMB, CKMBINDEX, TROPONINI in the last 168 hours. CBG: Recent Labs  Lab 10/11/20 1618  GLUCAP 116*    Iron Studies: No results for input(s): IRON, TIBC, TRANSFERRIN, FERRITIN in the last 72 hours. Studies/Results: CT CHEST ABDOMEN PELVIS W CONTRAST  Result Date: 10/12/2020 CLINICAL DATA:  Endometrial cancer staging EXAM: CT CHEST, ABDOMEN, AND PELVIS WITH CONTRAST TECHNIQUE: Multidetector CT imaging of the chest, abdomen and pelvis was performed following the standard protocol during bolus administration of intravenous contrast. CONTRAST:  164mL OMNIPAQUE IOHEXOL 300 MG/ML  SOLN COMPARISON:  11/07/2019 FINDINGS: CT CHEST FINDINGS Cardiovascular: Scattered  aortic atherosclerosis. Cardiomegaly. No pericardial effusion. Mediastinum/Nodes: No enlarged mediastinal, hilar, or axillary lymph nodes. Thyroid gland, trachea, and esophagus demonstrate no significant findings. Lungs/Pleura: Moderate right, small left pleural effusions and associated atelectasis or consolidation. Pulmonary nodule of the anterior inferior right upper lobe measuring 2.1 x 1.6 cm (series 4, image 76). Pulmonary nodule of the superior segment left lower lobe measuring 1.7 x 1.6 cm (series 4, image 78). Musculoskeletal: No chest wall mass or suspicious bone lesions identified. CT ABDOMEN PELVIS FINDINGS Hepatobiliary: Heterogeneous contrast enhancement of the liver parenchyma. No focal liver lesion. No gallstones, gallbladder wall thickening, or biliary dilatation. Pancreas: Unremarkable. No pancreatic ductal dilatation or surrounding inflammatory changes. Spleen: Normal in size without significant abnormality. Adrenals/Urinary Tract: Adrenal glands are unremarkable. Markedly atrophic left kidney. The right kidney  is normal in appearance. No hydronephrosis. Bladder is unremarkable. Stomach/Bowel: Stomach is within normal limits. Appendix appears normal. No evidence of bowel wall thickening, distention, or inflammatory changes. Vascular/Lymphatic: No significant vascular findings are present. No enlarged abdominal or pelvic lymph nodes. Reproductive: No mass or other abnormality. Other: Severe anasarca. Large volume ascites throughout the abdomen and pelvis. Musculoskeletal: No acute or significant osseous findings. IMPRESSION: 1. Pulmonary nodules of the anterior inferior right upper lobe and superior segment left lower lobe measuring up to 2.1 cm. These are highly concerning for pulmonary metastatic disease. 2. Moderate right, small left pleural effusions and associated atelectasis or consolidation. 3. Large volume ascites throughout the abdomen and pelvis without specific evidence of metastatic disease. No significant lymphadenopathy or peritoneal nodularity appreciated. 4. No pelvic mass appreciated. 5. Heterogeneous contrast enhancement of the liver parenchyma, suggesting passive hepatic congestion and/or chronic liver disease. 6. Markedly atrophic left kidney. 7. Severe anasarca. Aortic Atherosclerosis (ICD10-I70.0). Electronically Signed   By: Eddie Candle M.D.   On: 10/12/2020 15:58   . carvedilol  25 mg Oral BID WC  . Chlorhexidine Gluconate Cloth  6 each Topical Q0600  . ferrous sulfate  325 mg Oral BID WC  . heparin injection (subcutaneous)  5,000 Units Subcutaneous Q8H  . hydrALAZINE  50 mg Oral Q8H  . hydrocortisone cream   Topical BID  . isosorbide mononitrate  30 mg Oral Daily  . nutrition supplement (JUVEN)  1 packet Oral BID BM  . potassium chloride SA  10 mEq Oral BID  . sodium chloride flush  3 mL Intravenous Q12H    BMET    Component Value Date/Time   NA 141 10/14/2020 0243   NA 146 12/10/2019 0000   K 3.6 10/14/2020 0243   CL 105 10/14/2020 0243   CO2 25 10/14/2020 0243   GLUCOSE 84  10/14/2020 0243   BUN 75 (H) 10/14/2020 0243   BUN 32 (A) 12/10/2019 0000   CREATININE 3.00 (H) 10/14/2020 0243   CREATININE 1.21 (H) 08/15/2016 1656   CALCIUM 8.7 (L) 10/14/2020 0243   GFRNONAA 17 (L) 10/14/2020 0243   GFRNONAA 48 (L) 08/15/2016 1656   GFRAA 30 (L) 03/18/2020 1254   GFRAA 56 (L) 08/15/2016 1656   CBC    Component Value Date/Time   WBC 2.8 (L) 10/14/2020 0243   RBC 3.26 (L) 10/14/2020 0243   HGB 9.5 (L) 10/14/2020 0243   HCT 29.6 (L) 10/14/2020 0243   HCT 29.5 (L) 11/06/2019 2140   PLT 70 (L) 10/14/2020 0243   MCV 90.8 10/14/2020 0243   MCH 29.1 10/14/2020 0243   MCHC 32.1 10/14/2020 0243   RDW 18.6 (H) 10/14/2020 0243   LYMPHSABS 0.7 08/07/2020  1736   MONOABS 0.1 08/07/2020 1736   EOSABS 0.0 08/07/2020 1736   BASOSABS 0.1 08/07/2020 1736     Assessment/Plan:  1. 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  She does not want any more dialysis, tried IV Lasix and desires to go home with hospice.  Will switch IV Lasix to PO torsemide 100 mg BID 2. Acute on chronic combined systolic and diastolic CHF - Cardiology has signed off as she was not felt to be a good candidate for long term advanced HF therapies.  She has not responded to po torsemide and she does not want more dialysis, so will resume IV lasix and follow response.  Will dose with IV albumin prior to IV lasix.  May need to resume metolazone as well.  3. H/o atrial flutter with paroxysmal atrial tachycardia - not on anticoagulation due to increased risk of bleeding. 4. HTN - was hypotensive with RVR, now improved with rate control  5. Endometrial cancer - stage I, not operable and received palliative XRT.  CT scan of chest and abdomen with suspicious pulmonary nodules of the anterior inferior RUL and superior segment of LLL.  6. Ascites - unclear if this is from CHF or endometrial cancer. 7. Disposition: receives [facility] palliative services as OP.   Ok to d/c now from renal perspective   Madelon Lips MD [facility] [phone]

## 2020-10-14 NOTE — Progress Notes (Signed)
[facility] ([facility])  Pt is approved for hospice services at [facility].  [facility] has not been able to reach family to confirm d/c plan or determine if DME is needed.  [facility] will plan to enroll her into hospice services upon discharge to [facility].  Venia Carbon RN, BSN, [facility] Liaison

## 2020-10-20 ENCOUNTER — Ambulatory Visit: Payer: Medicare Other | Admitting: Physician Assistant

## 2020-10-20 NOTE — Progress Notes (Deleted)
Cardiology Office Note:    Date:  10/20/2020   ID:  Vanessa [name], DOB 07/31/1955, MRN [id number]  PCP:  Patient, No Pcp Per (Inactive)   [place]  Cardiologist:  Ena Dawley, MD *** Advanced Practice Provider:  No care team member to display Electrophysiologist:  None  {Press F2 to show EP APP, CHF, sleep or structural heart MD               :[id number]}  { Click here to update then REFRESH NOTE - MD (PCP) or APP (Team Member)  Change PCP Type for MD, Specialty for APP is either Cardiology or Clinical Cardiac Electrophysiology  :[id number]}   Referring MD: No ref. provider found   Chief Complaint:  No chief complaint on file.    Patient Profile:   {Link to SnapShot    :1}  Vanessa [name] is a 65 y.o. female with:   (HFrEF) heart failure with reduced ejection fraction   Not a candidate for invasive ischemic eval  Right-sided heart failure  Echo 1/22: EF 35-40  Pulmonary hypertension  Echo 1/22: RVSP 57.5  Atrial Flutter  Not a candidate for anticoagulation  Metastatic Endometrial CA   CT in 4/22: pulmonary mets  Hypertension   Hyperlipidemia   Diabetes mellitus   Chronic kidney disease IV  Not a candidate for dialysis  Asthma   Anemia of chronic disease  DNR  Prior CV studies: Echocardiogram 08/08/20 EF 35-40, global HK, mild LVH, mod reduced RVSF, mod elevated PASP (RVSP 57.5), mild LAE, trivial MR, mod TR, mild AI  ABIs 04/22/20 Normal  Echocardiogram  EF 55-60, normal RVSF, RVSP 57.5, mild to mod MR, mod LAE, trivial AI     History of Present Illness:    [name] was initially evaluated by cardiology during an admission 1/20-2/8 with acute HFrEF in the setting of atrial flutter with rapid ventricular rate.  Previous EF was known to be normal.  Echocardiogram during that admission demonstrated an EF of 35-40.  She has a history of ongoing bleeding related to endometrial carcinoma and associated anemia.   Therefore, she was not felt to be a candidate for anticoagulation.  Given her comorbid illnesses, she was also not felt to be a candidate for ischemic evaluation (i.e. cardiac catheterization).  She did have elevated high-sensitivity troponin levels which were felt to be related to demand ischemia.  She had worsening creatinine and nephrology help to manage her diuresis.  She was discharged to SNF.    She was readmitted 3/17-4/6 with decompensated heart failure.  She was again followed by nephrology to help with diuresis.  She was continued on rate controlling therapy for her atrial fibrillation/flutter.  Nephrology felt that she was not a good candidate for dialysis.  CT scan did demonstrate pulmonary metastasis.  She was seen by palliative care and has been made DNR.  She was discharged with outpatient hospice.    [name] returns for f/u.  ***         Past Medical History:  Diagnosis Date  . Anemia 10/09/2019  . Asthma   . Cellulitis and abscess of lower extremity 04/22/2020  . CKD (chronic kidney disease) stage 3, GFR 30-59 ml/min (HCC)   . Dental disease   . Diabetes mellitus without complication ([place])   . Endometrial cancer ([place])   . Esophageal dysmotility   . Heart murmur   . HLD (hyperlipidemia)   . Hypertension   . MSSA bacteremia 09/2019  .  Obesity, Class III, BMI 40-49.9 (morbid obesity) ([facility])   . Pressure ulcer   . Vaginal bleeding 05/2020    Current Medications: No outpatient medications have been marked as taking for the 10/20/20 encounter (Appointment) with Richardson Dopp T, PA-C.     Allergies:   Ace inhibitors and Angiotensin receptor blockers   Social History   Tobacco Use  . Smoking status: Former Research scientist (life sciences)  . Smokeless tobacco: Never Used  . Tobacco comment: " years ago ", >62yrs  Vaping Use  . Vaping Use: Never used  Substance Use Topics  . Alcohol use: Yes    Comment: occasional  . Drug use: No     Family Hx: The patient's family history includes  Cancer in her maternal grandmother and mother; Diabetes in her brother and mother; Hypertension in her brother and mother; Stroke in her mother.  ROS   EKGs/Labs/Other Test Reviewed:    EKG:  EKG is *** ordered today.  The ekg ordered today demonstrates ***  Recent Labs: 10/02/2020: B Natriuretic Peptide 1,216.5; TSH 4.834 10/09/2020: Magnesium 1.9 10/11/2020: ALT 10 10/14/2020: BUN 75; Creatinine, Ser 3.00; Hemoglobin 9.5; Platelets 70; Potassium 3.6; Sodium 141   Recent Lipid Panel Lab Results  Component Value Date/Time   CHOL 153 08/10/2015 08:57 AM   TRIG 82 08/10/2015 08:57 AM   HDL 61 08/10/2015 08:57 AM   CHOLHDL 2.5 08/10/2015 08:57 AM   LDLCALC 76 08/10/2015 08:57 AM      Risk Assessment/Calculations:   {Does this patient have ATRIAL FIBRILLATION?:[phone]}  Physical Exam:    VS:  LMP 03/17/2015     Wt Readings from Last 3 Encounters:  10/13/20 206 lb 2.1 oz (93.5 kg)  08/17/20 216 lb (98 kg)  06/10/20 260 lb (117.9 kg)     Physical Exam ***     ASSESSMENT & PLAN:    ***  {Are you ordering a CV Procedure (e.g. stress test, cath, DCCV, TEE, etc)?   Press F2        :[id number]}    Dispo:  No follow-ups on file.   Medication Adjustments/Labs and Tests Ordered: Current medicines are reviewed at length with the patient today.  Concerns regarding medicines are outlined above.  Tests Ordered: No orders of the defined types were placed in this encounter.  Medication Changes: No orders of the defined types were placed in this encounter.   Signed, Richardson Dopp, PA-C  10/20/2020 8:13 AM    [facility] [place], [place], [place]  [address] Phone: [phone]; Fax: [phone]

## 2020-10-21 ENCOUNTER — Telehealth: Payer: Self-pay | Admitting: *Deleted

## 2020-10-21 NOTE — Telephone Encounter (Signed)
Called the patient and left a message to call the office back. Per Dr Berline Lopes the patient needs to be scheduled for a phone visit

## 2020-10-28 ENCOUNTER — Telehealth: Payer: Self-pay | Admitting: *Deleted

## 2020-10-28 NOTE — Telephone Encounter (Signed)
Several attempts to reach patient at [phone], left messages. Reached the patient's husband on 4/18, he stated "She doesn't live here . She is is a place/home but I don't know where." Attempted to reach the patient/patient's nurse at [facility], each time our office was transferred to her wing the phone got disconnected. Call the patient's daughter and left a message to call the office back tomorrow.

## 2020-11-13 ENCOUNTER — Other Ambulatory Visit: Payer: Self-pay | Admitting: *Deleted

## 2020-11-13 DIAGNOSIS — N186 End stage renal disease: Secondary | ICD-10-CM

## 2020-12-01 ENCOUNTER — Ambulatory Visit (HOSPITAL_COMMUNITY): Admission: RE | Admit: 2020-12-01 | Payer: Medicare Other | Source: Ambulatory Visit

## 2020-12-01 ENCOUNTER — Encounter: Payer: Medicare Other | Admitting: Vascular Surgery

## 2020-12-01 ENCOUNTER — Ambulatory Visit (HOSPITAL_COMMUNITY): Payer: Medicare Other

## 2021-02-24 ENCOUNTER — Ambulatory Visit (INDEPENDENT_AMBULATORY_CARE_PROVIDER_SITE_OTHER): Payer: Medicare Other | Admitting: Podiatry

## 2021-02-24 ENCOUNTER — Other Ambulatory Visit: Payer: Self-pay

## 2021-02-24 DIAGNOSIS — M79674 Pain in right toe(s): Secondary | ICD-10-CM

## 2021-02-24 DIAGNOSIS — M79675 Pain in left toe(s): Secondary | ICD-10-CM

## 2021-02-24 DIAGNOSIS — B351 Tinea unguium: Secondary | ICD-10-CM

## 2021-02-24 DIAGNOSIS — L853 Xerosis cutis: Secondary | ICD-10-CM

## 2021-02-24 MED ORDER — AMMONIUM LACTATE 12 % EX LOTN: 1.0000 "application " | TOPICAL_LOTION | CUTANEOUS | 0 refills | Status: AC | PRN

## 2021-02-24 NOTE — Progress Notes (Signed)
mmon

## 2021-02-25 NOTE — Progress Notes (Signed)
  Subjective:  Patient ID: Vanessa [name], female    DOB: 11-23-1955,  MRN: [id number]  Chief Complaint  Patient presents with   Nail Problem    Nail trim    65 y.o. female returns for the above complaint.  Patient presents with thickened elongated dystrophic toenails x10.  Painful to touch.  Patient would like to have them debrided out as she is not able to do so.  She would also like to discuss options for dry skin.  She states she is got some moderate to severe dry skin and she has done some over-the-counter lotion on it which has helped.  There is no fissuring or anything like that yet.  She denies any other acute complaints  Objective:  There were no vitals filed for this visit. Podiatric Exam: Vascular: dorsalis pedis and posterior tibial pulses are palpable bilateral. Capillary return is immediate. Temperature gradient is WNL. Skin turgor WNL  Sensorium: Normal Semmes Weinstein monofilament test. Normal tactile sensation bilaterally. Nail Exam: Pt has thick disfigured discolored nails with subungual debris noted bilateral entire nail hallux through fifth toenails.  Pain on palpation to the nails. Ulcer Exam: There is no evidence of ulcer or pre-ulcerative changes or infection. Orthopedic Exam: Muscle tone and strength are WNL. No limitations in general ROM. No crepitus or effusions noted. HAV  B/L.  Hammer toes 2-5  B/L. Skin: No Porokeratosis. No infection or ulcers.  Dry skin noted to both lower extremity moderate in nature.  No fissures noted.  No ulcers noted    Assessment & Plan:   1. Xerosis of skin   2. Pain due to onychomycosis of toenails of both feet     Patient was evaluated and treated and all questions answered.  Xerosis bilateral foot .I explained to the patient the etiology of xerosis and various treatment options were extensively discussed.  I explained to the patient the importance of maintaining moisturization of the skin with application of over-the-counter  lotion such as Eucerin or Luciderm.  Given that she has failed over-the-counter options I believe she will benefit from ammonium lactate.  Ammonium lactate was sent to the pharmacy of asked her to apply twice a day.  She states understanding   Onychomycosis with pain  -Nails palliatively debrided as below. -Educated on self-care  Procedure: Nail Debridement Rationale: pain  Type of Debridement: manual, sharp debridement. Instrumentation: Nail nipper, rotary burr. Number of Nails: 10  Procedures and Treatment: Consent by patient was obtained for treatment procedures. The patient understood the discussion of treatment and procedures well. All questions were answered thoroughly reviewed. Debridement of mycotic and hypertrophic toenails, 1 through 5 bilateral and clearing of subungual debris. No ulceration, no infection noted.  Return Visit-Office Procedure: Patient instructed to return to the office for a follow up visit 3 months for continued evaluation and treatment.  Boneta Lucks, DPM    No follow-ups on file.

## 2021-03-07 ENCOUNTER — Inpatient Hospital Stay (HOSPITAL_COMMUNITY)
Admission: EM | Admit: 2021-03-07 | Discharge: 2021-04-10 | DRG: 871 | Disposition: E | Source: Skilled Nursing Facility | Attending: Student in an Organized Health Care Education/Training Program | Admitting: Student in an Organized Health Care Education/Training Program

## 2021-03-07 ENCOUNTER — Emergency Department (HOSPITAL_COMMUNITY)

## 2021-03-07 DIAGNOSIS — R569 Unspecified convulsions: Secondary | ICD-10-CM

## 2021-03-07 DIAGNOSIS — D631 Anemia in chronic kidney disease: Secondary | ICD-10-CM | POA: Diagnosis present

## 2021-03-07 DIAGNOSIS — J45909 Unspecified asthma, uncomplicated: Secondary | ICD-10-CM | POA: Diagnosis present

## 2021-03-07 DIAGNOSIS — N186 End stage renal disease: Secondary | ICD-10-CM | POA: Diagnosis present

## 2021-03-07 DIAGNOSIS — E860 Dehydration: Secondary | ICD-10-CM | POA: Diagnosis present

## 2021-03-07 DIAGNOSIS — N390 Urinary tract infection, site not specified: Secondary | ICD-10-CM | POA: Diagnosis present

## 2021-03-07 DIAGNOSIS — Z8249 Family history of ischemic heart disease and other diseases of the circulatory system: Secondary | ICD-10-CM

## 2021-03-07 DIAGNOSIS — Z9181 History of falling: Secondary | ICD-10-CM

## 2021-03-07 DIAGNOSIS — E785 Hyperlipidemia, unspecified: Secondary | ICD-10-CM | POA: Diagnosis present

## 2021-03-07 DIAGNOSIS — R4701 Aphasia: Secondary | ICD-10-CM | POA: Diagnosis present

## 2021-03-07 DIAGNOSIS — C541 Malignant neoplasm of endometrium: Secondary | ICD-10-CM | POA: Diagnosis present

## 2021-03-07 DIAGNOSIS — E1122 Type 2 diabetes mellitus with diabetic chronic kidney disease: Secondary | ICD-10-CM | POA: Diagnosis present

## 2021-03-07 DIAGNOSIS — I639 Cerebral infarction, unspecified: Secondary | ICD-10-CM | POA: Diagnosis present

## 2021-03-07 DIAGNOSIS — R6520 Severe sepsis without septic shock: Secondary | ICD-10-CM | POA: Diagnosis present

## 2021-03-07 DIAGNOSIS — R627 Adult failure to thrive: Secondary | ICD-10-CM | POA: Diagnosis present

## 2021-03-07 DIAGNOSIS — N179 Acute kidney failure, unspecified: Secondary | ICD-10-CM | POA: Diagnosis present

## 2021-03-07 DIAGNOSIS — A419 Sepsis, unspecified organism: Secondary | ICD-10-CM

## 2021-03-07 DIAGNOSIS — Z8619 Personal history of other infectious and parasitic diseases: Secondary | ICD-10-CM

## 2021-03-07 DIAGNOSIS — A4151 Sepsis due to Escherichia coli [E. coli]: Secondary | ICD-10-CM | POA: Diagnosis present

## 2021-03-07 DIAGNOSIS — G9341 Metabolic encephalopathy: Secondary | ICD-10-CM | POA: Diagnosis present

## 2021-03-07 DIAGNOSIS — I5042 Chronic combined systolic (congestive) and diastolic (congestive) heart failure: Secondary | ICD-10-CM | POA: Diagnosis present

## 2021-03-07 DIAGNOSIS — Z7189 Other specified counseling: Secondary | ICD-10-CM | POA: Insufficient documentation

## 2021-03-07 DIAGNOSIS — N189 Chronic kidney disease, unspecified: Secondary | ICD-10-CM

## 2021-03-07 DIAGNOSIS — N184 Chronic kidney disease, stage 4 (severe): Secondary | ICD-10-CM | POA: Diagnosis present

## 2021-03-07 DIAGNOSIS — C7951 Secondary malignant neoplasm of bone: Secondary | ICD-10-CM | POA: Diagnosis present

## 2021-03-07 DIAGNOSIS — Z8542 Personal history of malignant neoplasm of other parts of uterus: Secondary | ICD-10-CM

## 2021-03-07 DIAGNOSIS — Z87891 Personal history of nicotine dependence: Secondary | ICD-10-CM

## 2021-03-07 DIAGNOSIS — C7800 Secondary malignant neoplasm of unspecified lung: Secondary | ICD-10-CM | POA: Diagnosis present

## 2021-03-07 DIAGNOSIS — R29716 NIHSS score 16: Secondary | ICD-10-CM | POA: Diagnosis present

## 2021-03-07 DIAGNOSIS — R040 Epistaxis: Secondary | ICD-10-CM | POA: Diagnosis present

## 2021-03-07 DIAGNOSIS — W19XXXA Unspecified fall, initial encounter: Secondary | ICD-10-CM

## 2021-03-07 DIAGNOSIS — C779 Secondary and unspecified malignant neoplasm of lymph node, unspecified: Secondary | ICD-10-CM | POA: Diagnosis present

## 2021-03-07 DIAGNOSIS — G9340 Encephalopathy, unspecified: Secondary | ICD-10-CM

## 2021-03-07 DIAGNOSIS — Z823 Family history of stroke: Secondary | ICD-10-CM

## 2021-03-07 DIAGNOSIS — Z833 Family history of diabetes mellitus: Secondary | ICD-10-CM

## 2021-03-07 DIAGNOSIS — Z888 Allergy status to other drugs, medicaments and biological substances status: Secondary | ICD-10-CM

## 2021-03-07 DIAGNOSIS — R479 Unspecified speech disturbances: Secondary | ICD-10-CM | POA: Diagnosis not present

## 2021-03-07 DIAGNOSIS — I132 Hypertensive heart and chronic kidney disease with heart failure and with stage 5 chronic kidney disease, or end stage renal disease: Secondary | ICD-10-CM | POA: Diagnosis present

## 2021-03-07 DIAGNOSIS — Z515 Encounter for palliative care: Secondary | ICD-10-CM | POA: Diagnosis not present

## 2021-03-07 DIAGNOSIS — C7931 Secondary malignant neoplasm of brain: Secondary | ICD-10-CM | POA: Diagnosis present

## 2021-03-07 DIAGNOSIS — R471 Dysarthria and anarthria: Secondary | ICD-10-CM | POA: Diagnosis present

## 2021-03-07 DIAGNOSIS — I4891 Unspecified atrial fibrillation: Secondary | ICD-10-CM | POA: Diagnosis present

## 2021-03-07 DIAGNOSIS — Z66 Do not resuscitate: Secondary | ICD-10-CM | POA: Diagnosis present

## 2021-03-07 DIAGNOSIS — Z79899 Other long term (current) drug therapy: Secondary | ICD-10-CM

## 2021-03-07 DIAGNOSIS — Z7982 Long term (current) use of aspirin: Secondary | ICD-10-CM

## 2021-03-07 DIAGNOSIS — R4182 Altered mental status, unspecified: Secondary | ICD-10-CM

## 2021-03-07 DIAGNOSIS — Z809 Family history of malignant neoplasm, unspecified: Secondary | ICD-10-CM

## 2021-03-07 LAB — I-STAT CHEM 8, ED
BUN: >130 mg/dL — ABNORMAL HIGH (ref 8–23)
Calcium, Ion: 1.05 mmol/L — ABNORMAL LOW (ref 1.15–1.40)
Chloride: 96 mmol/L — ABNORMAL LOW (ref 98–111)
Creatinine, Ser: 7.4 mg/dL — ABNORMAL HIGH (ref 0.44–1.00)
Glucose, Bld: 169 mg/dL — ABNORMAL HIGH (ref 70–99)
HCT: 31 % — ABNORMAL LOW (ref 36.0–46.0)
Hemoglobin: 10.5 g/dL — ABNORMAL LOW (ref 12.0–15.0)
Potassium: 4.8 mmol/L (ref 3.5–5.1)
Sodium: 143 mmol/L (ref 135–145)
TCO2: 32 mmol/L (ref 22–32)

## 2021-03-07 LAB — DIFFERENTIAL
Abs Immature Granulocytes: 0.18 10*3/uL — ABNORMAL HIGH (ref 0.00–0.07)
Basophils Absolute: 0 10*3/uL (ref 0.0–0.1)
Basophils Relative: 0 %
Eosinophils Absolute: 0 10*3/uL (ref 0.0–0.5)
Eosinophils Relative: 0 %
Immature Granulocytes: 1 %
Lymphocytes Relative: 3 %
Lymphs Abs: 0.4 10*3/uL — ABNORMAL LOW (ref 0.7–4.0)
Monocytes Absolute: 0.9 10*3/uL (ref 0.1–1.0)
Monocytes Relative: 6 %
Neutro Abs: 14.6 10*3/uL — ABNORMAL HIGH (ref 1.7–7.7)
Neutrophils Relative %: 90 %

## 2021-03-07 LAB — COMPREHENSIVE METABOLIC PANEL
ALT: 12 U/L (ref 0–44)
AST: 26 U/L (ref 15–41)
Albumin: 2.8 g/dL — ABNORMAL LOW (ref 3.5–5.0)
Alkaline Phosphatase: 133 U/L — ABNORMAL HIGH (ref 38–126)
Anion gap: 20 — ABNORMAL HIGH (ref 5–15)
BUN: 200 mg/dL — ABNORMAL HIGH (ref 8–23)
CO2: 29 mmol/L (ref 22–32)
Calcium: 9.7 mg/dL (ref 8.9–10.3)
Chloride: 94 mmol/L — ABNORMAL LOW (ref 98–111)
Creatinine, Ser: 7.3 mg/dL — ABNORMAL HIGH (ref 0.44–1.00)
GFR, Estimated: 6 mL/min — ABNORMAL LOW (ref >60)
Glucose, Bld: 174 mg/dL — ABNORMAL HIGH (ref 70–99)
Potassium: 4.8 mmol/L (ref 3.5–5.1)
Sodium: 143 mmol/L (ref 135–145)
Total Bilirubin: 0.9 mg/dL (ref 0.3–1.2)
Total Protein: 9.3 g/dL — ABNORMAL HIGH (ref 6.5–8.1)

## 2021-03-07 LAB — URINALYSIS, ROUTINE W REFLEX MICROSCOPIC
Bilirubin Urine: NEGATIVE
Glucose, UA: NEGATIVE mg/dL
Ketones, ur: NEGATIVE mg/dL
Nitrite: NEGATIVE
Protein, ur: 30 mg/dL — AB
Specific Gravity, Urine: 1.012 (ref 1.005–1.030)
WBC, UA: >50 WBC/hpf — ABNORMAL HIGH (ref 0–5)
pH: 5 (ref 5.0–8.0)

## 2021-03-07 LAB — CBC
HCT: 31.6 % — ABNORMAL LOW (ref 36.0–46.0)
Hemoglobin: 9.9 g/dL — ABNORMAL LOW (ref 12.0–15.0)
MCH: 27.8 pg (ref 26.0–34.0)
MCHC: 31.3 g/dL (ref 30.0–36.0)
MCV: 88.8 fL (ref 80.0–100.0)
Platelets: 262 10*3/uL (ref 150–400)
RBC: 3.56 MIL/uL — ABNORMAL LOW (ref 3.87–5.11)
RDW: 14.8 % (ref 11.5–15.5)
WBC: 16.2 10*3/uL — ABNORMAL HIGH (ref 4.0–10.5)
nRBC: 0 % (ref 0.0–0.2)

## 2021-03-07 LAB — PROTIME-INR
INR: 1.2 (ref 0.8–1.2)
Prothrombin Time: 15.5 seconds — ABNORMAL HIGH (ref 11.4–15.2)

## 2021-03-07 LAB — CBG MONITORING, ED: Glucose-Capillary: 154 mg/dL — ABNORMAL HIGH (ref 70–99)

## 2021-03-07 LAB — TROPONIN I (HIGH SENSITIVITY)
Troponin I (High Sensitivity): 58 ng/L — ABNORMAL HIGH (ref <18)
Troponin I (High Sensitivity): 95 ng/L — ABNORMAL HIGH (ref <18)

## 2021-03-07 LAB — LACTIC ACID, PLASMA
Lactic Acid, Venous: 1.6 mmol/L (ref 0.5–1.9)
Lactic Acid, Venous: 1.3 mmol/L (ref 0.5–1.9)

## 2021-03-07 LAB — APTT: aPTT: 29 seconds (ref 24–36)

## 2021-03-07 MED ORDER — VANCOMYCIN HCL 1500 MG/300ML IV SOLN
1500.0000 mg | Freq: Once | INTRAVENOUS | Status: AC
  Administered 2021-03-07: 1500 mg via INTRAVENOUS
  Filled 2021-03-07: qty 300

## 2021-03-07 MED ORDER — ONDANSETRON HCL 4 MG PO TABS: 4.0000 mg | ORAL_TABLET | Freq: Four times a day (QID) | ORAL | Status: DC | PRN

## 2021-03-07 MED ORDER — ONDANSETRON HCL 4 MG/2ML IJ SOLN: 4.0000 mg | Freq: Four times a day (QID) | INTRAMUSCULAR | Status: DC | PRN

## 2021-03-07 MED ORDER — ACETAMINOPHEN 650 MG RE SUPP: 650.0000 mg | Freq: Four times a day (QID) | RECTAL | Status: DC | PRN

## 2021-03-07 MED ORDER — LACTATED RINGERS IV BOLUS (SEPSIS)
1000.0000 mL | Freq: Once | INTRAVENOUS | Status: AC
  Administered 2021-03-07: 1000 mL via INTRAVENOUS

## 2021-03-07 MED ORDER — LORAZEPAM 2 MG/ML IJ SOLN
INTRAMUSCULAR | Status: AC
  Administered 2021-03-07: 2 mg
  Filled 2021-03-07: qty 1

## 2021-03-07 MED ORDER — HEPARIN SODIUM (PORCINE) 5000 UNIT/ML IJ SOLN
5000.0000 [IU] | Freq: Three times a day (TID) | INTRAMUSCULAR | Status: DC
  Administered 2021-03-07 – 2021-03-09 (×5): 5000 [IU] via SUBCUTANEOUS
  Filled 2021-03-07 (×5): qty 1

## 2021-03-07 MED ORDER — LEVETIRACETAM IN NACL 1000 MG/100ML IV SOLN
1000.0000 mg | Freq: Once | INTRAVENOUS | Status: AC
  Administered 2021-03-07: 1000 mg via INTRAVENOUS
  Filled 2021-03-07: qty 100

## 2021-03-07 MED ORDER — DEXAMETHASONE SODIUM PHOSPHATE 10 MG/ML IJ SOLN
10.0000 mg | Freq: Once | INTRAMUSCULAR | Status: AC
  Administered 2021-03-07: 10 mg via INTRAVENOUS
  Filled 2021-03-07: qty 1

## 2021-03-07 MED ORDER — SENNOSIDES-DOCUSATE SODIUM 8.6-50 MG PO TABS: 1.0000 | ORAL_TABLET | Freq: Every evening | ORAL | Status: DC | PRN

## 2021-03-07 MED ORDER — PIPERACILLIN-TAZOBACTAM IN DEX 2-0.25 GM/50ML IV SOLN
2.2500 g | Freq: Three times a day (TID) | INTRAVENOUS | Status: DC
  Administered 2021-03-07 – 2021-03-08 (×3): 2.25 g via INTRAVENOUS
  Filled 2021-03-07 (×5): qty 50

## 2021-03-07 MED ORDER — ACETAMINOPHEN 325 MG PO TABS: 650.0000 mg | ORAL_TABLET | Freq: Four times a day (QID) | ORAL | Status: DC | PRN

## 2021-03-07 MED ORDER — SODIUM CHLORIDE 0.9% FLUSH
3.0000 mL | Freq: Once | INTRAVENOUS | Status: AC
  Administered 2021-03-07: 3 mL via INTRAVENOUS

## 2021-03-07 MED ORDER — VANCOMYCIN VARIABLE DOSE PER UNSTABLE RENAL FUNCTION (PHARMACIST DOSING): Status: DC

## 2021-03-07 NOTE — Progress Notes (Signed)
Pharmacy Antibiotic Note  Vanessa [name] is a 65 y.o. female admitted on 02/11/2021 with sepsis.  Pharmacy has been consulted for vancomycin and zosyn dosing.  Patient with a significant history of CKDIV, type 2 diabetes mellitus, endometrial cancer, hyperlipidemia, essential hypertension, combined systolic and diastolic CHF with EF of 29-02%, atrial fibrillation not on anticoagulation due to chronic bleeding from endometrial cancer, stage I endometrial cancer s/p palliative radiation, anemia of chronic disease, history of MSSA bacteremia.  Patient is presenting as code stroke for AMS. Patient previously on HD but this has been stopped.  Per CCM, continuing care that can extend life without interfering with quality and comfort measures -- has deferred antibiotic decision to admitting team.  SCr 7.4  Plan: Zosyn 2.25g q8h Vancomycin 1500 mg once - subsequent dosing per random level F/u comfort / palliative care treatment goals Follow WBC, fever, cultures De-escalate antibiotics when/if appropriate     Temp (24hrs), Avg:92.3 F (33.5 C), Min:92.3 F (33.5 C), Max:92.3 F (33.5 C)  Recent Labs  Lab 03/10/2021 1603 02/25/2021 1608  WBC 16.2*  --   CREATININE 7.30* 7.40*    CrCl cannot be calculated (Unknown ideal weight.).    Allergies  Allergen Reactions   Ace Inhibitors Other (See Comments)    Never prescribed but she has had angioedema   Angiotensin Receptor Blockers Other (See Comments)    ARB never Rxed but PMH  of angioedema in March 2021   Antimicrobials this admission: Vancomycin 8/28 >>  Zosyn 8/28 >>   Microbiology results: Pending  Thank you for allowing pharmacy to be a part of this patient's care.  Lorelei Pont, PharmD, BCPS 02/14/2021 7:26 PM ED Clinical Pharmacist -  [phone]

## 2021-03-07 NOTE — Progress Notes (Signed)
EEG completed, results pending. 

## 2021-03-07 NOTE — H&P (Signed)
Date: 02/23/2021               Patient Name:  Vanessa [name] MRN: [id number]  DOB: [birth year] Age / Sex: 65 y.o., female   PCP: Patient, No Pcp Per (Inactive)         Medical Service: Internal Medicine Teaching Service         Attending Physician: Dr. Lucious Groves, DO    First Contact: Dr. Jeanice Lim Pager: [phone]  Second Contact: Dr. Darrick Meigs Pager: [phone]       After Hours (After 5p/  First Contact Pager: [phone]  weekends / holidays): Second Contact Pager: [phone]   Chief Complaint: AMS, unwitnessed fall  History of Present Illness: Vanessa [name] is a 65 y.o. 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  History primarily obtained from patient's daughter over the phone as patient was unable to provide history in the room.  According to the daughter, the patient lives at a facility and was in her room by herself when she tried to get up and move around and fell.  She states that "her legs were not strong enough."  When an employee from the facility went to check on her, they found her face down on the floor and unresponsive.  Daughter mentions that she has had falls in the past, mostly "due to her not taking her meds or due to having low blood pressure," but this fall was unusual because this time she was found unresponsive and had hit her head when she had fallen.  She has been up-to-date on her medications and usually has someone from facility helping her take her meds.  When asked about her behavior after her fall, the daughter  states that she was mostly unresponsive and did have an episode of incontinence.  Daughter states that the patient may have also hit her head and sustained a laceration to her face while the employees were in the room, but she is unsure of the exact timeline of when this occurred.  She also states that she has noticed a change in her mother's behavior in the past few weeks.  She states that she went from talking in full sentences to barely being able to speak.  Her daughter states that she is usually pretty independent, but within the past week or so she has required some help from others, especially when walking around her home.  Meds:  Current Meds  Medication Sig   acetaminophen (TYLENOL) 500 MG tablet Take 500 mg by mouth every 6 (six) hours as needed (pain).    ammonium lactate (AMLACTIN) 12 % lotion Apply 1 application topically as needed for dry skin.   aspirin 81 MG chewable tablet Chew 1 tablet (81 mg total) by mouth daily.   carvedilol (COREG) 25 MG tablet Take 1 tablet (25 mg total) by mouth 2 (two) times daily with a meal.   Dextromethorphan-guaiFENesin (ROBITUSSIN COUGH+CHEST CONG DM PO) Take 10 mLs by mouth every 4 (four) hours as needed (cough/congestion).   ferrous sulfate 325 (65 FE) MG tablet Take 1 tablet (325  mg total) by mouth 2 (two) times daily with a meal.   fluticasone (FLONASE) 50 MCG/ACT nasal spray Place 1 spray into both nostrils daily as needed for allergies or rhinitis.   hydrALAZINE (APRESOLINE) 50 MG tablet Take 1 tablet (50 mg total) by mouth every 8 (eight) hours.   hydrocerin (EUCERIN) CREA Apply 1 application topically daily.   hydrocortisone (ANUSOL-HC) 2.5 % rectal cream Place 1 application rectally every 12 (twelve) hours as needed for hemorrhoids.   hydrOXYzine (ATARAX/VISTARIL) 25 MG tablet Take 1 tablet (25 mg total) by mouth 3 (three) times daily as needed for itching.   [employer] [facility]) OINT Apply 1 application topically See admin  instructions. Apply topically to buttocks two times daily and after incontinent episodes   isosorbide mononitrate (IMDUR) 30 MG 24 hr tablet Take 1 tablet (30 mg total) by mouth daily.   loratadine (CLARITIN) 10 MG tablet Take 10 mg by mouth every morning.   Nutritional Supplements (NUTRITIONAL SUPPLEMENT PO) Take 120 mLs by mouth 2 (two) times daily. Med [employer]   nystatin (MYCOSTATIN/NYSTOP) powder Apply 1 application topically See admin instructions. Apply topically under both breasts every day after bath until area is resolved   oxyCODONE (OXY IR/ROXICODONE) 5 MG immediate release tablet Take 5 mg by mouth every 6 (six) hours as needed (pain).   OXYGEN Inhale 2 L into the lungs See admin instructions. To maintain SAT greater than 90% and as needed for shortness of breath   polyethylene glycol (MIRALAX / GLYCOLAX) 17 g packet Take 17 g by mouth daily as needed. (Patient taking differently: Take 17 g by mouth daily as needed (constipation). Mix with 8 oz water or juice)   potassium chloride (KLOR-CON) 10 MEQ tablet Take 10 mEq by mouth 2 (two) times daily.   torsemide (DEMADEX) 100 MG tablet Take 100 mg by mouth 2 (two) times daily.   witch hazel-glycerin (TUCKS) pad Apply topically as needed for itching, hemorrhoids or irritation.    Allergies: Allergies as of 03/01/2021 - Review Complete 02/14/2021  Allergen Reaction Noted   Ace inhibitors Other (See Comments) 11/27/2019   Angiotensin receptor blockers Other (See Comments) 11/27/2019   Past Medical History:  Diagnosis Date   Anemia 10/09/2019   Asthma    Cellulitis and abscess of lower extremity 04/22/2020   CKD (chronic kidney disease) stage 3, GFR 30-59 ml/min (HCC)    Dental disease    Diabetes mellitus without complication ([place])    Endometrial cancer ([place])    Esophageal dysmotility    Heart murmur    HLD (hyperlipidemia)    Hypertension    MSSA bacteremia 09/2019   Obesity, Class III, BMI 40-49.9 (morbid obesity) ([place])     Pressure ulcer    Vaginal bleeding 05/2020    Family History: Mother is deceased with a history of stroke, diabetes, hypertension, cancer (possible ovarian).  Brother with diabetes and hypertension, and maternal grandmother with cancer (possible ovarian). Psych history on mother's side (never been checked out, but has been told her mother may have been bipolar or had schizophrenia).  Social History: Lives in a facility, they check up on her every 12 hours or so. Hasn't smoked in over 40 years, unsure packs per day. Denies alcohol use within the last 30 years. Her hospice is the [facility].  Review of Systems: A complete ROS was negative except as per HPI.   Physical Exam: Blood pressure (!) 153/69, pulse 71, temperature 98.3 F (36.8 C), resp. rate 14, weight  79.4 kg, last menstrual period 03/17/2015, SpO2 100 %. Physical Exam Constitutional:      General: She is not in acute distress.    Comments: Frail-appearing. Responsive to voice only.  HENT:     Head: Normocephalic.     Comments: There is some dried blood on patient's face, however no active signs of bleeding.    Mouth/Throat:     Mouth: Mucous membranes are dry.     Comments: Missing several teeth Eyes:     Conjunctiva/sclera: Conjunctivae normal.     Comments: Pupils with sluggish reaction to light  Cardiovascular:     Rate and Rhythm: Normal rate and regular rhythm.     Heart sounds: No murmur heard.   No friction rub. No gallop.  Pulmonary:     Effort: Pulmonary effort is normal.     Breath sounds: Normal breath sounds.  Abdominal:     General: Bowel sounds are normal. There is no distension.     Palpations: Abdomen is soft.     Comments: Does not appear to be tender to palpation in all 4 quadrants  Musculoskeletal:        General: No swelling or deformity.     Right lower leg: No edema.     Left lower leg: No edema.  Skin:    General: Skin is warm and dry.  Neurological:     Comments: Lethargic,  responds to voice only.  Unable to assess further     EKG: personally reviewed my interpretation is sinus rhythm, borderline prolonged QTc  CXR: personally reviewed my interpretation is patient has some cardiomegaly which is unchanged from her prior imaging in April 2022. There are bilateral pulmonary masses, measuring up to 6.6 cm, have increased from exam in April 2022. No other acute cardiopulmonary processes noted.  CT Head Code Stroke WO contrast: IMPRESSION: 1. Several small hyperdense foci in the cerebrum and cerebellum likely reflecting metastases. No edema. Brain MRI without and with contrast is recommended for further evaluation. 2. No evidence of an acute infarct or definite intracranial hemorrhage. ASPECTS of 10.  CT C-spine without contrast: IMPRESSION: 1. No evidence of acute fracture or traumatic subluxation in the cervical spine. 2. Biapical lung nodules including a new 1.8 cm nodule in the left upper lobe concerning for progressive metastatic disease. 3. Bilateral cervical lymphadenopathy concerning for metastatic disease.  MR brain without contrast and MRA head WO contrast: IMPRESSION: 1. Scattered small acute infarcts in both cerebral hemispheres and right cerebellum. 2. Small subdural collections/possible subdural hematomas over both cerebral convexities. No mass effect. 3. Scattered small areas of mild edema in both cerebral hemispheres and left cerebellum corresponding to suspected metastases on CT. Recommend a complete brain MRI to include IV contrast when the patient is clinically able. 4. Motion degraded head MRA without evidence of major vessel occlusion in the anterior circulation. 5. Nonvisualization of the right vertebral artery which could be small and obscured by motion artifact or occluded.   Assessment & Plan by Problem: Vanessa [name] is a 65 y.o. female with a PMHx of CKD stage IV (does not want and is not a candidate for dialysis),  metastatic endometrial cancer status post palliative radiation, T2DM, HLD, HTN, combined systolic and diastolic CHF with EF of 90-24%, cardiorenal syndrome, atrial fibrillation (not on anticoagulation d/t chronic bleeding from endometrial cancer), anemia of chronic disease, and MSSA bacteremia who is currently in hospice care now with acute encephalopathy, multifactorial in nature, admitted for medical management of her  comorbidities.   Principal Problem:   Acute encephalopathy  #Acute encephalopathy with unwitnessed fall #Hospice care and DNR patient  Patient has a history of CKD stage IV and endometrial cancer who presented to the ED as a code stroke after she was found down for an unknown period of time. Upon arrival to the ED, patient was hypothermic to 92.3, RR 10-15, pulse 67R-91M, systolic BP 384Y-659D, 357% O2 on RA.  On exam, she was only responsive to voice. Na 143, K 4.8, Cl 94, bicarb 29 with AG 20. Cr 7.30 up from baseline 3.6-3.7, BUN 200. WBC 16.2, platelets WNL, lactic acid WNL. UA is cloudy, positive for Hgb with some RBCs, leukocytes, WBCs and bacteria. Trops 58->95.  Her EEG was negative for seizure.  Her acute encephalopathy is multifactorial in nature. She has a UTI as a suspected source of infection and does meet SIRS criteria for sepsis with multiple organ dysfunction (brain, kidney). She was found to have brain mets and multiple infarcts on imaging.  There is also a metabolic component to her presentation as she also has significant uremia and is approaching ESRD.   Unfortunately, the patient has a poor prognosis overall. She follows with outpatient hospice ([facility]) and is DNR. Will continue medical management as below with palliative care team involvement. Daughter plans on coming to the hospital tomorrow around 2 PM to have this discussion with other family members present.  -Palliative care team on board, appreciate their recs -Will reach out to patient's hospice group  at the request of the patient's daughter  #Sepsis with multiple organ dysfunction #UTI Patient was hypotensive on arrival with an elevated WBC count, found to have UTI as suspected source of infection. -Zosyn per pharmacy recs -Vancomycin per pharmacy recs -Blood cultures x2 pending -Urine culture pending  #Metastatic endometrial cancer #Acute small scattered infarcts, right cerebellum and bilateral cerebral hemispheres Patient with multiple new brain mets, lung nodules, and cervical lymphadenopathy.  She did have some acute small scattered infarcts on imaging, however neurology feels that this is a less likely contributor. Patient is already s/p 1x Decardron and will continue seizure prophylaxis per neurology.  -Neurology is on board, appreciate their recs -s/p Keppra 1000 mg load, then maintenance keppra 500 mg BID. Adjust maintenance dosing as needed for renal function (see neuro recs).  #CKD IV approaching ESRD #H/o cardiorenal syndrome Baseline creatinine 3.6-3.7. Creatinine of 7.3, BUN 200. Per review of records, patient does not wish to pursue and is not a candidate for HD.   #Combined systolic and diastolic HF  Last echo in January 2022 showed EF 35-40%. On torsemide regimen at home. No signs of volume overload on exam today. Holding home meds due to NPO status.  #Atrial fibrillation Patient is not on anticoagulation due to chronic bleeding from her endometrial cancer. -Telemetry  #Anemia of Chronic Disease #IDA Hgb 9.9, MCV 88. On ferrous sulfate regimen at home which we are currently holding due to NPO status.  #T2DM Last A1c of 5.5 in April 2022, not on any medications at home.  #HTN BP in [id number]-939Q systolic since arrival. Patient is on carvedilol 25 mg PO BID, hydralazine 50 mg, PO q8h, and imdur 30 mg PO daily. Currently holding home BP meds due to NPO status.  Dispo: Admit patient to Inpatient with expected length of stay greater than 2  midnights.  Signed: Orvis Brill, MD 02/11/2021, 10:20 PM  Pager: [phone]  After 5pm on weekdays and 1pm on weekends: On Call pager: [phone]

## 2021-03-07 NOTE — Procedures (Signed)
Patient Name: Vanessa [name]  MRN: [id number]  Epilepsy Attending: Lora Havens  Referring Physician/Provider: Anibal Henderson, NP Date: 03/10/2021 Duration: 24.06 mins  Patient history:  65 y.o. female who presents for evaluation of aphasia s/p unwitnessed fall at her SNF. Patient visualized to have behavioral and speech arrest every 20-30 seconds during examination concerning for seizure activity. EEG to evaluate for seizure.   Level of alertness: Awake  AEDs during EEG study: None  Technical aspects: This EEG study was done with scalp electrodes positioned according to the 10-20 International system of electrode placement. Electrical activity was acquired at a sampling rate of 500Hz  and reviewed with a high frequency filter of 70Hz  and a low frequency filter of 1Hz . EEG data were recorded continuously and digitally stored.   Description: The posterior dominant rhythm consists of 7.5Hz  activity of moderate voltage (25-35 uV) seen predominantly in posterior head regions, symmetric and reactive to eye opening and eye closing. EEG showed continuous generalized 5 to 6 Hz theta as well as intermittent 2-3hz  delta slowing.  Hyperventilation and photic stimulation were not performed.     ABNORMALITY - Continuous slow, generalized  IMPRESSION: This study is suggestive of mild to moderate diffuse encephalopathy, nonspecific etiology. No seizures or epileptiform discharges were seen throughout the recording.  Mandalyn Pasqua Barbra Sarks

## 2021-03-07 NOTE — Consult Note (Addendum)
NAME:  Vanessa [name], MRN:  [id number], DOB:  15-Apr-1956, LOS: 0 ADMISSION DATE:  03/10/2021, CONSULTATION DATE:  02/21/2021 REFERRING MD:  Langston Masker - EM, CHIEF COMPLAINT:  AMS   History of Present Illness:  65 yo F with metastatic endometrial cancer with very poor prognosis who is in Hospice care, DNR status, CKD IV (who in April expressed that she did not want further dialysis), cardiorenal syndrome who was brought to ED 8/28 as code stroke for AMS. Brought in as code stroke after sustaining a fall at nursing facility-- imaging revealed multiple brain mets which is a new finding, as well as multiple acute infarcts in R cerebellum and both cerebral hemispheres   Patient also presents with numerous metabolic abnormalities including a Cr 7.3 BUN >130.    She arrives with 2 gold forms stating DNR and a pink form from February 2022 stating DNR but continue medical management as indicated. This predates April admission when patient and family decided to pursue hospice care but has led to a gray area regarding the limitations in care for acute decompensations.    PCCM is consulted for admission in this setting.  Pertinent  Medical History  Metastatic endometrial cancer  Cardiorenal syndrome  CKD IV MSSA bacteremia  DNR Systolic and diastolic HF  Significant Hospital Events: Including procedures, antibiotic start and stop dates in addition to other pertinent events   8/28 patient presents to ED with AMS-- found to have brain mets. Multisystem organ failure. PCCM consulted for admission.   Interim History / Subjective:  PCCM consulted   Objective   Blood pressure 139/74, pulse 62, temperature (!) 92.3 F (33.5 C), temperature source Rectal, resp. rate 15, weight 79.4 kg, last menstrual period 03/17/2015, SpO2 93 %.       No intake or output data in the 24 hours ending 02/11/2021 1754 Filed Weights   02/26/2021 1750  Weight: 79.4 kg    Examination: General: Frail, ill appearing adult  female, appears older than stated age who appears to be at the end of life HENT: Head laceration. Missing teeth. Dry mucus membranes. Temporal muscle wasting. Cervical lymphadenopathy.  Lungs: Symmetrical chest expansion. No accessory use.  Cardiovascular: regular cap refill <3sec Abdomen: soft round.  Extremities: Symmetrical muscle wasting. Noa cute joint deformity.  Neuro: Somnolent. Awakens to voice. 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, R cerebellum  -multifactorial -- Brain mets, renal failure with uremia, infection, -EEG without seizure P -neuro following -don't think needs cEEG, will defer to neuro  -Keppra -decadron  -there is a degree of terminality to her encephalopathy with BUN > 130 and not being a candidate for HD  -NPO   Stage IV Metastatic endometrial cancer with multiple new mets to brain, multiple pulm nodules, lymphadenopathy  -s/p palliative XRT -- very poor prognosis  -in 10/2020 patient wished to pursue hospice P -GOC as below    Acute renal failure on CKD IV Cardiorenal syndrome  -patient expressed in April desire not to pursue dialysis. Cancelled appointments with [facility] for AVF. Pursued hospice P -at this point the pt is not a candidate for HD (personally discussed w nephro) and this would not align with pts previously expressed wishes  -IVF reasonable for dehydration   Combined systolic and diastolic HF  P -no acute intervention  Hx MSSA bacteremia  P -ED  has started on abx with SIRS criteria, defer to admitting team    DNR Status Hospice Care patient  Goals of Care  Failure to thrive  -Dr. Ruthann Cancer spoke with the patient's family. Clear DNR, does not want intubation, wants to offer some supportive care but honor patient's wish to die a comfortable and natural death.  -not a candidate for  HD, and pt has expressed wishes against.  P -DNR -Palliative care consult -- comfort care would be quite appropriate but understand the daughter's grief and shock (she was unaware pt was brought to the hospital at all) -Family wants admitting team to reach out to pts hospice group.  -anticipate EOL in days-weeks      Thank you for consulting PCCM. At this time, there is not ICU level need -- PCCM will sign off. Please re-engage if we can be of further assistance or if patient status changes    Best Practice (right click and "Reselect all SmartList Selections" daily)   Diet/type: NPO Code Status:  DNR Last date of multidisciplinary goals of care discussion [facility] and family 8/28]  Labs   CBC: Recent Labs  Lab 02/08/2021 1603 02/14/2021 1608  WBC 16.2*  --   NEUTROABS 14.6*  --   HGB 9.9* 10.5*  HCT 31.6* 31.0*  MCV 88.8  --   PLT 262  --     Basic Metabolic Panel: Recent Labs  Lab 03/03/2021 1603 02/23/2021 1608  NA 143 143  K 4.8 4.8  CL 94* 96*  CO2 29  --   GLUCOSE 174* 169*  BUN 200* >130*  CREATININE 7.30* 7.40*  CALCIUM 9.7  --    GFR: Estimated Creatinine Clearance: 6.8 mL/min (A) (by C-G formula based on SCr of 7.4 mg/dL (H)). Recent Labs  Lab 03/04/2021 1603  WBC 16.2*    Liver Function Tests: Recent Labs  Lab 03/04/2021 1603  AST 26  ALT 12  ALKPHOS 133*  BILITOT 0.9  PROT 9.3*  ALBUMIN 2.8*   No results for input(s): LIPASE, AMYLASE in the last 168 hours. No results for input(s): AMMONIA in the last 168 hours.  ABG    Component Value Date/Time   TCO2 32 02/09/2021 1608     Coagulation Profile: Recent Labs  Lab 02/18/2021 1603  INR 1.2    Cardiac Enzymes: No results for input(s): CKTOTAL, CKMB, CKMBINDEX, TROPONINI in the last 168 hours.  HbA1C: Hgb A1c MFr Bld  Date/Time Value Ref Range Status  10/11/2020 07:43 AM 5.5 4.8 - 5.6 % Final    Comment:    (NOTE) Pre diabetes:          5.7%-6.4%  Diabetes:              >6.4%  Glycemic  control for   <7.0% adults with diabetes   04/22/2020 05:15 AM 5.9 (H) 4.8 - 5.6 % Final    Comment:    (NOTE)         Prediabetes: 5.7 - 6.4         Diabetes: >6.4         Glycemic control for adults with diabetes: <7.0     CBG: Recent Labs  Lab 03/01/2021 1602  GLUCAP 154*    Review of Systems:   Unable to obtain due to encephalopathy   Past Medical History:  She,  has a past medical history of Anemia (10/09/2019), Asthma, Cellulitis and abscess of lower extremity (04/22/2020), CKD (chronic kidney disease) stage 3, GFR 30-59 ml/min ([place]),  Dental disease, Diabetes mellitus without complication ([place]), Endometrial cancer ([place]), Esophageal dysmotility, Heart murmur, HLD (hyperlipidemia), Hypertension, MSSA bacteremia (09/2019), Obesity, Class III, BMI 40-49.9 (morbid obesity) ([place]), Pressure ulcer, and Vaginal bleeding (05/2020).   Surgical History:   Past Surgical History:  Procedure Laterality Date   BIOPSY  10/08/2019   Procedure: BIOPSY;  Surgeon: Clarene Essex, MD;  Location: [facility];  Service: Endoscopy;;   ESOPHAGOGASTRODUODENOSCOPY N/A 10/08/2019   Procedure: ESOPHAGOGASTRODUODENOSCOPY (EGD);  Surgeon: Clarene Essex, MD;  Location: [facility];  Service: Endoscopy;  Laterality: N/A;   IR FLUORO GUIDE CV LINE LEFT  10/04/2020   IR US GUIDE VASC ACCESS LEFT  10/04/2020   TYMPANOSTOMY TUBE PLACEMENT Bilateral      Social History:   reports that she has quit smoking. She has never used smokeless tobacco. She reports current alcohol use. She reports that she does not use drugs.   Family History:  Her family history includes Cancer in her maternal grandmother and mother; Diabetes in her brother and mother; Hypertension in her brother and mother; Stroke in her mother.   Allergies Allergies  Allergen Reactions   Ace Inhibitors Other (See Comments)    Never prescribed but she has had angioedema   Angiotensin Receptor Blockers Other (See Comments)    ARB never Rxed but PMH   of angioedema in March 2021     Home Medications  Prior to Admission medications   Medication Sig Start Date End Date Taking? Authorizing Provider  acetaminophen (TYLENOL) 500 MG tablet Take 500 mg by mouth every 6 (six) hours as needed (pain).     [provider]  ammonium lactate (AMLACTIN) 12 % lotion Apply 1 application topically as needed for dry skin. 02/24/21   Felipa Furnace, DPM  aspirin 81 MG chewable tablet Chew 1 tablet (81 mg total) by mouth daily. 08/18/20   Cristal Ford, DO  carvedilol (COREG) 25 MG tablet Take 1 tablet (25 mg total) by mouth 2 (two) times daily with a meal. 08/18/20   Cristal Ford, DO  ferrous sulfate 325 (65 FE) MG tablet Take 1 tablet (325 mg total) by mouth 2 (two) times daily with a meal. 12/13/19   Medina-Vargas, Monina C, NP  fluticasone (FLONASE) 50 MCG/ACT nasal spray Place 1 spray into both nostrils daily as needed for allergies or rhinitis.    [provider]  hydrALAZINE (APRESOLINE) 50 MG tablet Take 1 tablet (50 mg total) by mouth every 8 (eight) hours. 08/18/20   Mikhail, Velta Addison, DO  hydrocerin (EUCERIN) CREA Apply 1 application topically daily. 04/29/20   Swayze, Ava, DO  hydrocortisone (ANUSOL-HC) 2.5 % rectal cream Place 1 application rectally every 12 (twelve) hours as needed for hemorrhoids. 08/18/20   Cristal Ford, DO  hydrOXYzine (ATARAX/VISTARIL) 25 MG tablet Take 1 tablet (25 mg total) by mouth 3 (three) times daily as needed for itching. 08/18/20   Cristal Ford, DO  [employer] [facility]) OINT Apply 1 application topically See admin instructions. To buttocks bid and after incontinent episodes.    [provider]  isosorbide mononitrate (IMDUR) 30 MG 24 hr tablet Take 1 tablet (30 mg total) by mouth daily. 08/19/20   Mikhail, Velta Addison, DO  nystatin (MYCOSTATIN/NYSTOP) powder Apply 1 application topically See admin instructions. Under both breasts daily after bath until resolved    [provider]   polyethylene glycol (MIRALAX / GLYCOLAX) 17 g packet Take 17 g by mouth daily as needed. 10/14/20   Aline August, MD  potassium chloride (  KLOR-CON) 10 MEQ tablet Take 10 mEq by mouth 2 (two) times daily.    [provider]  torsemide (DEMADEX) 100 MG tablet Take 100 mg by mouth 2 (two) times daily. Patient not taking: No sig reported    [provider]  witch hazel-glycerin (TUCKS) pad Apply topically as needed for itching, hemorrhoids or irritation. 08/18/20   Cristal Ford, DO     Critical care time: 21 min     Eliseo Gum MSN, AGACNP-BC [place] for pager  02/10/2021, 6:37 PM

## 2021-03-07 NOTE — ED Triage Notes (Signed)
Pt bib [facility] from a nursing facility as a code stroke. Per ems, the pt was found face down on the floor beside her bed. Pt exhibited aphasia. Pt is A&O x4 at her baseline. LSN @ 1500.

## 2021-03-07 NOTE — ED Provider Notes (Signed)
[facility] EMERGENCY DEPARTMENT Provider Note   CSN: [id number] Arrival date & time: 03/10/2021  1557     History Chief Complaint  Patient presents with   Code Stroke    Vanessa [name] is a 65 y.o. female with history of cardiorenal syndrome, MSSA bacteremia, hypertension, endometrial cancer, diabetes, presented to emergency department with altered mental status and found down.  Family found the patient down on the floor at her bedside toda around 3 PM, with expressive aphasia.  She was last seen normal at 3 PM.  Patient is minimally verbal on arrival.  She arrives as a code stroke.  HPI     Past Medical History:  Diagnosis Date   Anemia 10/09/2019   Asthma    Cellulitis and abscess of lower extremity 04/22/2020   CKD (chronic kidney disease) stage 3, GFR 30-59 ml/min (HCC)    Dental disease    Diabetes mellitus without complication ([place])    Endometrial cancer ([place])    Esophageal dysmotility    Heart murmur    HLD (hyperlipidemia)    Hypertension    MSSA bacteremia 09/2019   Obesity, Class III, BMI 40-49.9 (morbid obesity) ([place])    Pressure ulcer    Vaginal bleeding 05/2020    Patient Active Problem List   Diagnosis Date Noted   Acute renal failure ([place])    Brain metastases ([place])    Acute encephalopathy    DNR (do not resuscitate)    Goals of care, counseling/discussion    Acute right-sided heart failure ([place])    Acute on chronic combined systolic (congestive) and diastolic (congestive) heart failure ([place]) 09/24/2020   Acute respiratory failure with hypoxia ([place]) 65/99/3570   Acute diastolic CHF (congestive heart failure) ([place]) 08/08/2020   Elevated troponin 08/08/2020   Abnormal uterine bleeding due to primary malignant neoplasm of endometrium ([place]) 05/24/2020   Cellulitis 04/23/2020   Bilateral lower leg cellulitis 04/22/2020   Neurocognitive deficits 12/11/2019   Edema 12/04/2019   Acute kidney injury superimposed on CKD ([place]) 12/03/2019    Hypothermia 11/26/2019   CKD (chronic kidney disease) stage 3, GFR 30-59 ml/min (HCC)    Hypernatremia    COVID-19 virus infection    Generalized weakness    Hypertensive urgency    Allergic angioedema 10/19/2019   Anasarca 10/18/2019   Angioedema 10/18/2019   Hypertensive emergency 10/18/2019   Trichimoniasis 10/17/2019   Macrocytic anemia 10/05/2019   Esophageal dysmotility 10/04/2019   Pressure injury of skin 09/22/2019   Endometrial cancer ([place]) 09/21/2019   Left humeral fracture 09/21/2019   Iron deficiency anemia due to chronic blood loss 09/21/2019   MSSA bacteremia 09/18/2019   Postmenopausal bleeding 09/15/2019   Thrombocytopenia ([place]) 09/15/2019   Generalized pruritus 09/15/2019   Benign hypertension with CKD (chronic kidney disease) stage III ([place]) 09/15/2019   Hyperuricemia 09/15/2019   Hypokalemia 09/15/2019   Symptomatic anemia 09/14/2019   Non compliance w medication regimen 03/24/2015   Dyslipidemia 03/24/2015   DM type 2 (diabetes mellitus, type 2) ([place]) 05/23/2014   HTN (hypertension) 05/23/2014    Past Surgical History:  Procedure Laterality Date   BIOPSY  10/08/2019   Procedure: BIOPSY;  Surgeon: Clarene Essex, MD;  Location: [facility];  Service: Endoscopy;;   ESOPHAGOGASTRODUODENOSCOPY N/A 10/08/2019   Procedure: ESOPHAGOGASTRODUODENOSCOPY (EGD);  Surgeon: Clarene Essex, MD;  Location: [facility];  Service: Endoscopy;  Laterality: N/A;   IR FLUORO GUIDE CV LINE LEFT  10/04/2020   IR US GUIDE VASC ACCESS LEFT  10/04/2020  TYMPANOSTOMY TUBE PLACEMENT Bilateral      OB History     Gravida  5   Para  5   Term  4   Preterm  1   AB      Living  5      SAB      IAB      Ectopic      Multiple      Live Births  63           Family History  Problem Relation Age of Onset   Stroke Mother    Diabetes Mother    Hypertension Mother    Cancer Mother        possible ovarian   Diabetes Brother    Hypertension Brother    Cancer  Maternal Grandmother        possible ovarian    Social History   Tobacco Use   Smoking status: Former   Smokeless tobacco: Never   Tobacco comments:    " years ago ", >38yrs  Vaping Use   Vaping Use: Never used  Substance Use Topics   Alcohol use: Yes    Comment: occasional   Drug use: No    Home Medications Prior to Admission medications   Medication Sig Start Date End Date Taking? Authorizing Provider  acetaminophen (TYLENOL) 500 MG tablet Take 500 mg by mouth every 6 (six) hours as needed (pain).    Yes [provider]  ammonium lactate (AMLACTIN) 12 % lotion Apply 1 application topically as needed for dry skin. 02/24/21  Yes Felipa Furnace, DPM  aspirin 81 MG chewable tablet Chew 1 tablet (81 mg total) by mouth daily. 08/18/20  Yes Mikhail, Velta Addison, DO  carvedilol (COREG) 25 MG tablet Take 1 tablet (25 mg total) by mouth 2 (two) times daily with a meal. 08/18/20  Yes Mikhail, Clinical biochemist, DO  Dextromethorphan-guaiFENesin (ROBITUSSIN COUGH+CHEST CONG DM PO) Take 10 mLs by mouth every 4 (four) hours as needed (cough/congestion).   Yes [provider]  ferrous sulfate 325 (65 FE) MG tablet Take 1 tablet (325 mg total) by mouth 2 (two) times daily with a meal. 12/13/19  Yes Medina-Vargas, Monina C, NP  fluticasone (FLONASE) 50 MCG/ACT nasal spray Place 1 spray into both nostrils daily as needed for allergies or rhinitis.   Yes [provider]  hydrALAZINE (APRESOLINE) 50 MG tablet Take 1 tablet (50 mg total) by mouth every 8 (eight) hours. 08/18/20  Yes Mikhail, [place], DO  hydrocerin (EUCERIN) CREA Apply 1 application topically daily. 04/29/20  Yes Swayze, Ava, DO  hydrocortisone (ANUSOL-HC) 2.5 % rectal cream Place 1 application rectally every 12 (twelve) hours as needed for hemorrhoids. 08/18/20  Yes Mikhail, Velta Addison, DO  hydrOXYzine (ATARAX/VISTARIL) 25 MG tablet Take 1 tablet (25 mg total) by mouth 3 (three) times daily as needed for itching. 08/18/20  Yes Cristal Ford, DO  [facility]) OINT Apply 1 application topically See admin instructions. Apply topically to buttocks two times daily and after incontinent episodes   Yes [provider]  isosorbide mononitrate (IMDUR) 30 MG 24 hr tablet Take 1 tablet (30 mg total) by mouth daily. 08/19/20  Yes Mikhail, Velta Addison, DO  loratadine (CLARITIN) 10 MG tablet Take 10 mg by mouth every morning.   Yes [provider]  Nutritional Supplements (NUTRITIONAL SUPPLEMENT PO) Take 120 mLs by mouth 2 (two) times daily. Med Pass   Yes [provider]  nystatin (MYCOSTATIN/NYSTOP) powder Apply 1 application  topically See admin instructions. Apply topically under both breasts every day after bath until area is resolved   Yes [provider]  oxyCODONE (OXY IR/ROXICODONE) 5 MG immediate release tablet Take 5 mg by mouth every 6 (six) hours as needed (pain). 02/27/21  Yes [provider]  OXYGEN Inhale 2 L into the lungs See admin instructions. To maintain SAT greater than 90% and as needed for shortness of breath   Yes [provider]  polyethylene glycol (MIRALAX / GLYCOLAX) 17 g packet Take 17 g by mouth daily as needed. Patient taking differently: Take 17 g by mouth daily as needed (constipation). Mix with 8 oz water or juice 10/14/20  Yes Alekh, Kshitiz, MD  potassium chloride (KLOR-CON) 10 MEQ tablet Take 10 mEq by mouth 2 (two) times daily.   Yes [provider]  torsemide (DEMADEX) 100 MG tablet Take 100 mg by mouth 2 (two) times daily.   Yes [provider]  witch hazel-glycerin (TUCKS) pad Apply topically as needed for itching, hemorrhoids or irritation. 08/18/20  Yes Mikhail, [place], DO    Allergies    Ace inhibitors and Angiotensin receptor blockers  Review of Systems   Review of Systems  Physical Exam Updated Vital Signs BP (!) 153/69   Pulse 72   Temp 98.1 F (36.7 C)   Resp 14   Wt 79.4 kg   LMP 03/17/2015   SpO2  100%   BMI 35.35 kg/m   Physical Exam  ED Results / Procedures / Treatments   Labs (all labs ordered are listed, but only abnormal results are displayed) Labs Reviewed  PROTIME-INR - Abnormal; Notable for the following components:      Result Value   Prothrombin Time 15.5 (*)    All other components within normal limits  CBC - Abnormal; Notable for the following components:   WBC 16.2 (*)    RBC 3.56 (*)    Hemoglobin 9.9 (*)    HCT 31.6 (*)    All other components within normal limits  DIFFERENTIAL - Abnormal; Notable for the following components:   Neutro Abs 14.6 (*)    Lymphs Abs 0.4 (*)    Abs Immature Granulocytes 0.18 (*)    All other components within normal limits  COMPREHENSIVE METABOLIC PANEL - Abnormal; Notable for the following components:   Chloride 94 (*)    Glucose, Bld 174 (*)    BUN 200 (*)    Creatinine, Ser 7.30 (*)    Total Protein 9.3 (*)    Albumin 2.8 (*)    Alkaline Phosphatase 133 (*)    GFR, Estimated 6 (*)    Anion gap 20 (*)    All other components within normal limits  URINALYSIS, ROUTINE W REFLEX MICROSCOPIC - Abnormal; Notable for the following components:   APPearance CLOUDY (*)    Hgb urine dipstick MODERATE (*)    Protein, ur 30 (*)    Leukocytes,Ua LARGE (*)    WBC, UA >50 (*)    Bacteria, UA MANY (*)    All other components within normal limits  I-STAT CHEM 8, ED - Abnormal; Notable for the following components:   Chloride 96 (*)    BUN >130 (*)    Creatinine, Ser 7.40 (*)    Glucose, Bld 169 (*)    Calcium, Ion 1.05 (*)    Hemoglobin 10.5 (*)    HCT 31.0 (*)    All other components within normal limits  CBG MONITORING, ED - Abnormal;  Notable for the following components:   Glucose-Capillary 154 (*)    All other components within normal limits  TROPONIN I (HIGH SENSITIVITY) - Abnormal; Notable for the following components:   Troponin I (High Sensitivity) 58 (*)    All other components within normal limits  CULTURE, BLOOD  (ROUTINE X 2)  CULTURE, BLOOD (ROUTINE X 2)  URINE CULTURE  APTT  LACTIC ACID, PLASMA  LACTIC ACID, PLASMA  TROPONIN I (HIGH SENSITIVITY)    EKG None  Radiology CT CERVICAL SPINE WO CONTRAST  Result Date: 02/17/2021 CLINICAL DATA:  Fall. EXAM: CT CERVICAL SPINE WITHOUT CONTRAST TECHNIQUE: Multidetector CT imaging of the cervical spine was performed without intravenous contrast. Multiplanar CT image reconstructions were also generated. COMPARISON:  Chest CT 10/12/2020 FINDINGS: Alignment: Normal. Skull base and vertebrae: No acute fracture or suspicious osseous lesion. Soft tissues and spinal canal: No prevertebral fluid or swelling. No visible canal hematoma. Disc levels: Bulky anterior vertebral osteophytes from C4-T1. Posterior longitudinal ligament ossification at C5. Suspected disc extrusion and/or posterior longitudinal ligament thickening behind the C3 vertebral body resulting in severe spinal stenosis. Upper chest: Partially visualized left upper lobe lung nodule measuring at least 1.8 cm, new from the prior chest CT. New 3 mm left upper lobe nodule. 3 mm right upper lobe nodule which is either new or was obscured by the pleural effusion and compressive atelectasis on the prior chest CT. Other: Enlarged left level III lymph node measuring 1.1 cm in short axis. Enlarged right level V lymph node measuring 1.7 cm. Mild diffuse thyroid heterogeneity without significant enlargement; in the setting of significant comorbidities or limited life expectancy, no follow-up recommended. IMPRESSION: 1. No evidence of acute fracture or traumatic subluxation in the cervical spine. 2. Biapical lung nodules including a new 1.8 cm nodule in the left upper lobe concerning for progressive metastatic disease. 3. Bilateral cervical lymphadenopathy concerning for metastatic disease. Electronically Signed   By: Logan Bores M.D.   On: 02/16/2021 16:41   MR ANGIO HEAD WO CONTRAST  Result Date: 03/02/2021 CLINICAL  DATA:  Neuro deficit, acute, stroke suspected. Aphasia. Fall. History of endometrial cancer. EXAM: MRI HEAD WITHOUT CONTRAST MRA HEAD WITHOUT CONTRAST TECHNIQUE: Multiplanar, multi-echo pulse sequences of the brain and surrounding structures were acquired without intravenous contrast. Angiographic images of the Circle of Willis were acquired using MRA technique without intravenous contrast. COMPARISON:  Head CT 03/09/2021 FINDINGS: MRI HEAD FINDINGS An abbreviated study was performed at the request of the ordering physician. Axial and coronal diffusion weighted imaging, axial T2 FLAIR imaging, and axial susceptibility weighted imaging were performed. Susceptibility weighted imaging is severely motion degraded. There are small scattered foci of restricted diffusion involving cortex and white matter in both cerebral hemispheres compatible with acute infarcts which could reflect watershed ischemia or emboli. There is also a punctate acute infarct inferiorly in the right cerebellar hemisphere. A chronic infarct is again noted in the right corona radiata. Mild cerebral atrophy is within normal limits for age. Small FLAIR hyperintense subdural collections over the cerebral convexities measure up to 3 mm in thickness on the right and 1 mm on the left and may reflect small subdural hematomas although confirmation of the presence of blood products is limited by motion artifact. There is no associated mass effect. Assessment of the suspected small brain metastases described on CT is limited on this abbreviated noncontrast study. However, there is mild FLAIR hyperintensity suggesting mild edema in the left cerebellar hemisphere, posterior left temporal lobe, left parietal lobe,  and right frontal lobe where small hyperdense lesions were noted on CT. MRA HEAD FINDINGS The study is motion degraded including severe motion at the skull base. Anterior circulation: The internal carotid arteries are patent from skull base to carotid  termini with limited assessment of the petrous segments due to motion but no evidence of a flow limiting stenosis more distally. ACAs and MCAs are patent without evidence of a proximal branch occlusion or flow limiting A1 or M1 stenosis. No aneurysm is identified. Posterior circulation: The intracranial left vertebral artery is patent and supplies the basilar. A right vertebral artery is not clearly identified with assessment limited by motion. The basilar artery is patent without evidence of a flow limiting stenosis. Both PCAs are patent without evidence of a flow limiting proximal stenosis. No aneurysm is identified. Anatomic variants: None. IMPRESSION: 1. Scattered small acute infarcts in both cerebral hemispheres and right cerebellum. 2. Small subdural collections/possible subdural hematomas over both cerebral convexities. No mass effect. 3. Scattered small areas of mild edema in both cerebral hemispheres and left cerebellum corresponding to suspected metastases on CT. Recommend a complete brain MRI to include IV contrast when the patient is clinically able. 4. Motion degraded head MRA without evidence of major vessel occlusion in the anterior circulation. 5. Nonvisualization of the right vertebral artery which could be small and obscured by motion artifact or occluded. Electronically Signed   By: Logan Bores M.D.   On: 02/19/2021 17:39   MR BRAIN WO CONTRAST  Result Date: 02/12/2021 CLINICAL DATA:  Neuro deficit, acute, stroke suspected. Aphasia. Fall. History of endometrial cancer. EXAM: MRI HEAD WITHOUT CONTRAST MRA HEAD WITHOUT CONTRAST TECHNIQUE: Multiplanar, multi-echo pulse sequences of the brain and surrounding structures were acquired without intravenous contrast. Angiographic images of the Circle of Willis were acquired using MRA technique without intravenous contrast. COMPARISON:  Head CT 02/08/2021 FINDINGS: MRI HEAD FINDINGS An abbreviated study was performed at the request of the ordering  physician. Axial and coronal diffusion weighted imaging, axial T2 FLAIR imaging, and axial susceptibility weighted imaging were performed. Susceptibility weighted imaging is severely motion degraded. There are small scattered foci of restricted diffusion involving cortex and white matter in both cerebral hemispheres compatible with acute infarcts which could reflect watershed ischemia or emboli. There is also a punctate acute infarct inferiorly in the right cerebellar hemisphere. A chronic infarct is again noted in the right corona radiata. Mild cerebral atrophy is within normal limits for age. Small FLAIR hyperintense subdural collections over the cerebral convexities measure up to 3 mm in thickness on the right and 1 mm on the left and may reflect small subdural hematomas although confirmation of the presence of blood products is limited by motion artifact. There is no associated mass effect. Assessment of the suspected small brain metastases described on CT is limited on this abbreviated noncontrast study. However, there is mild FLAIR hyperintensity suggesting mild edema in the left cerebellar hemisphere, posterior left temporal lobe, left parietal lobe, and right frontal lobe where small hyperdense lesions were noted on CT. MRA HEAD FINDINGS The study is motion degraded including severe motion at the skull base. Anterior circulation: The internal carotid arteries are patent from skull base to carotid termini with limited assessment of the petrous segments due to motion but no evidence of a flow limiting stenosis more distally. ACAs and MCAs are patent without evidence of a proximal branch occlusion or flow limiting A1 or M1 stenosis. No aneurysm is identified. Posterior circulation: The intracranial left vertebral artery  is patent and supplies the basilar. A right vertebral artery is not clearly identified with assessment limited by motion. The basilar artery is patent without evidence of a flow limiting  stenosis. Both PCAs are patent without evidence of a flow limiting proximal stenosis. No aneurysm is identified. Anatomic variants: None. IMPRESSION: 1. Scattered small acute infarcts in both cerebral hemispheres and right cerebellum. 2. Small subdural collections/possible subdural hematomas over both cerebral convexities. No mass effect. 3. Scattered small areas of mild edema in both cerebral hemispheres and left cerebellum corresponding to suspected metastases on CT. Recommend a complete brain MRI to include IV contrast when the patient is clinically able. 4. Motion degraded head MRA without evidence of major vessel occlusion in the anterior circulation. 5. Nonvisualization of the right vertebral artery which could be small and obscured by motion artifact or occluded. Electronically Signed   By: Logan Bores M.D.   On: 03/04/2021 17:39   DG Chest Port 1 View  Result Date: 03/10/2021 CLINICAL DATA:  Questionable sepsis.  History of endometrial cancer. EXAM: PORTABLE CHEST 1 VIEW COMPARISON:  CT chest dated October 12, 2020. Chest x-ray dated October 10, 2020. FINDINGS: Unchanged mild cardiomegaly. Normal pulmonary vascularity. Bilateral pulmonary masses measuring up to 6.6 cm, increased since April. No pleural effusion or pneumothorax. No acute osseous abnormality. IMPRESSION: 1. Increased pulmonary metastatic disease. Electronically Signed   By: Titus Dubin M.D.   On: 02/16/2021 17:55   EEG adult  Result Date: 02/21/2021 Lora Havens, MD     03/10/2021  6:14 PM Patient Name: Vanessa [name] MRN: [id number] Epilepsy Attending: Lora Havens Referring Physician/Provider: Anibal Henderson, NP Date: 02/18/2021 Duration: 24.06 mins Patient history:  65 y.o. female who presents for evaluation of aphasia s/p unwitnessed fall at her [facility]F. Patient visualized to have behavioral and speech arrest every 20-30 seconds during examination concerning for seizure activity. EEG to evaluate for seizure. Level of alertness:  Awake AEDs during EEG study: None Technical aspects: This EEG study was done with scalp electrodes positioned according to the 10-20 International system of electrode placement. Electrical activity was acquired at a sampling rate of 500Hz  and reviewed with a high frequency filter of 70Hz  and a low frequency filter of 1Hz . EEG data were recorded continuously and digitally stored. Description: The posterior dominant rhythm consists of 7.5Hz  activity of moderate voltage (25-35 uV) seen predominantly in posterior head regions, symmetric and reactive to eye opening and eye closing. EEG showed continuous generalized 5 to 6 Hz theta as well as intermittent 2-3hz  delta slowing.  Hyperventilation and photic stimulation were not performed.   ABNORMALITY - Continuous slow, generalized IMPRESSION: This study is suggestive of mild to moderate diffuse encephalopathy, nonspecific etiology. No seizures or epileptiform discharges were seen throughout the recording. Lora Havens   CT HEAD CODE STROKE WO CONTRAST  Result Date: 02/10/2021 CLINICAL DATA:  Code stroke. Neuro deficit, acute, stroke suspected. Aphasia. Fall. History of endometrial cancer. EXAM: CT HEAD WITHOUT CONTRAST TECHNIQUE: Contiguous axial images were obtained from the base of the skull through the vertex without intravenous contrast. COMPARISON:  09/17/2019 FINDINGS: Brain: There is no evidence of an acute infarct, midline shift, or extra-axial fluid collection. There are several subcentimeter rounded hyperdense foci located superficially in both cerebral hemispheres (including both frontal lobes, left parietal lobe, and left temporal lobe) which are new from the prior CT and most suggestive of small masses rather than hemorrhage. A 1 cm hyperdense lesion is also suspected in the left cerebellar  hemisphere. There is no associated edema. There is a chronic infarct in the right corona radiata/right caudate body with associated ex vacuo dilatation of the body  of the right lateral ventricle. Mild cerebral atrophy is within normal limits for age. Hypodensities in the cerebral white matter bilaterally are nonspecific but compatible with mild chronic small vessel ischemic disease. Vascular: Calcified atherosclerosis at the skull base. No hyperdense vessel. Skull: No fracture or suspicious osseous lesion. Sinuses/Orbits: Mild mucosal thickening/small mucous retention cysts in the right maxillary sinus. Small right mastoid effusion. Unremarkable orbits. Other: None. ASPECTS [facility] Stroke Program Early CT Score) - Ganglionic level infarction (caudate, lentiform nuclei, internal capsule, insula, M1-M3 cortex): 7 - Supraganglionic infarction (M4-M6 cortex): 3 Total score (0-10 with 10 being normal): 10 IMPRESSION: 1. Several small hyperdense foci in the cerebrum and cerebellum likely reflecting metastases. No edema. Brain MRI without and with contrast is recommended for further evaluation. 2. No evidence of an acute infarct or definite intracranial hemorrhage. ASPECTS of 10. These results were communicated to Dr. Jaynee Eagles at 4:30 pm on 02/16/2021 by text page via the [facility] messaging system. Electronically Signed   By: Logan Bores M.D.   On: 02/25/2021 16:32    Procedures .Critical Care  Date/Time: 03/02/2021 9:24 PM Performed by: Wyvonnia Dusky, MD Authorized by: Wyvonnia Dusky, MD   Critical care provider statement:    Critical care time (minutes):  45   Critical care was necessary to treat or prevent imminent or life-threatening deterioration of the following conditions:  CNS failure or compromise   Critical care was time spent personally by me on the following activities:  Discussions with consultants, evaluation of patient's response to treatment, examination of patient, ordering and performing treatments and interventions, ordering and review of laboratory studies, ordering and review of radiographic studies, pulse oximetry, re-evaluation of patient's  condition, obtaining history from patient or surrogate and review of old charts   Care discussed with: admitting provider     Medications Ordered in ED Medications  piperacillin-tazobactam (ZOSYN) IVPB 2.25 g (has no administration in time range)  vancomycin variable dose per unstable renal function (pharmacist dosing) (has no administration in time range)  sodium chloride flush (NS) 0.9 % injection 3 mL (3 mLs Intravenous Given 02/28/2021 1659)  LORazepam (ATIVAN) 2 MG/ML injection (2 mg  Given 02/20/2021 1700)  levETIRAcetam (KEPPRA) IVPB 1000 mg/100 mL premix (0 mg Intravenous Stopped 03/05/2021 1838)  dexamethasone (DECADRON) injection 10 mg (10 mg Intravenous Given 03/08/2021 1709)  lactated ringers bolus 1,000 mL (0 mLs Intravenous Stopped 02/13/2021 1838)  vancomycin (VANCOREADY) IVPB 1500 mg/300 mL (1,500 mg Intravenous New Bag/Given 03/03/2021 1856)    ED Course  I have reviewed the triage vital signs and the nursing notes.  Pertinent labs & imaging results that were available during my care of the patient were reviewed by me and considered in my medical decision making (see chart for details).  Patient arrives altered mental status.  Differential diagnosis includes stroke versus metabolic crisis versus infection versus brain metastasis versus other.  CT scan and subsequent imaging concerning for metastatic brain disease.  On arrival in the ED room, the patient demonstrated evidence of possible ongoing seizures, with rhythmic eye flaking, intermittently verbal between.  She was given Ativan.  Neurology was consulted and would arrange for EEG.  Labs are highly concerning for both sepsis and acute renal failure.  She was hypothermic with an elevated white blood cell count.  UA suggestive of infectious source.  Patient also  has significant elevation of both BUN and creatinine.  Critical care and nephrology were consulted, and further confirmed with the patient's family, this time she is not a  candidate and is not within her wishes to pursue aggressive care including dialysis.  Patient to be admitted to hospital service for IV fluids and antibiotics, and also for palliative care consult with the patient's family.  Clinical Course as of 03/10/2021 2126  Nancy Fetter Mar 07, 2021  1732 Consult placed with ICU team.  Attempted to place a Foley to see if the patient is producing any urine.  Her BUN and creatinine are significantly elevated [MT]  1735 I also spoke to Dr Elnita Maxwell from nephrology regarding BUN & C4 and possibility of dialysis [MT]  1830 ICU team has conferred with the patient's family and confirmed that she is DNR/DNI with do not want any aggressive resuscitative measures, she will not be a candidate for dialysis, likely is facing end-of-life.  Her immediate family did not yet indicate that they want comfort care measures only, therefore the patient will be admitted to hospitalist on IV fluids until further family discussions with palliative care can be held. [MT]  1915 Signed out to hospitalist [MT]    Clinical Course User Index [MT] Kaeleen Odom, Carola Rhine, MD    Final Clinical Impression(s) / ED Diagnoses Final diagnoses:  Sepsis, due to unspecified organism, unspecified whether acute organ dysfunction present [facility])  Urinary tract infection without hematuria, site unspecified  Brain metastasis ([place])  Acute renal failure superimposed on chronic kidney disease, unspecified CKD stage, unspecified acute renal failure type ([place])  Altered mental status, unspecified altered mental status type    Rx / DC Orders ED Discharge Orders     None        Wyvonnia Dusky, MD 02/08/2021 2126

## 2021-03-07 NOTE — Consult Note (Signed)
Neurology Consultation  Reason for Consult: Code Stroke Referring Physician: Dr. Langston Masker  CC: Aphasia s/p fall   History is obtained from: EMS, Chart review  HPI: Vanessa [name] is a 65 y.o. 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  Patient was last seen well at 15:00 and was found by facility staff at 15:10 facedown on the side of her bed bleeding from her nose and unable to speak or follow commands. Per facility staff, patient is usually awake and oriented x 4. She is able to walk with assistance from a walker.   Per discharge note from inpatient hospitalization on 10/14/2020 patient's prognosis from her endometrial cancer is noted as very poor palliative care following and outpatient hospice care.  Attempted to obtain further history from family regarding baseline mental and functional status but unable to contact via telephone.  LKW: 15:00 tpa given?: no, patient with unwitnessed fall, found face down with nose bleeding. Patient also with history of chronic bleeding from her endometrial cancer IR Thrombectomy? No, MRI imaging without evidence of large vessel territory ischemia.  Modified Rankin Scale: 4-Needs assistance to walk and tend to bodily needs  ROS: Unable to obtain due to altered mental status.   Past Medical History:  Diagnosis Date   Anemia 10/09/2019   Asthma    Cellulitis and abscess of lower extremity 04/22/2020   CKD (chronic kidney disease) stage 3, GFR 30-59 ml/min (HCC)    Dental disease     Diabetes mellitus without complication ([place])    Endometrial cancer ([place])    Esophageal dysmotility    Heart murmur    HLD (hyperlipidemia)    Hypertension    MSSA bacteremia 09/2019   Obesity, Class III, BMI 40-49.9 (morbid obesity) ([place])    Pressure ulcer    Vaginal bleeding 05/2020   Past Surgical History:  Procedure Laterality Date   BIOPSY  10/08/2019   Procedure: BIOPSY;  Surgeon: Clarene Essex, MD;  Location: [place];  Service: Endoscopy;;   ESOPHAGOGASTRODUODENOSCOPY N/A 10/08/2019   Procedure: ESOPHAGOGASTRODUODENOSCOPY (EGD);  Surgeon: Clarene Essex, MD;  Location: [place];  Service: Endoscopy;  Laterality: N/A;   IR FLUORO GUIDE CV LINE LEFT  10/04/2020   IR US GUIDE VASC ACCESS LEFT  10/04/2020   TYMPANOSTOMY TUBE PLACEMENT Bilateral    Family History  Problem Relation Age of Onset   Stroke Mother    Diabetes Mother    Hypertension Mother    Cancer Mother        possible ovarian   Diabetes Brother    Hypertension Brother    Cancer Maternal Grandmother        possible ovarian   Social History:   reports that she has quit smoking. She has never used smokeless tobacco. She reports current alcohol use. She reports that she does not use drugs. Medications  Current Facility-Administered Medications:    dexamethasone (DECADRON) injection 10 mg, 10 mg, Intravenous, Once, Trifan, Carola Rhine, MD   levETIRAcetam (KEPPRA) IVPB 1000 mg/100 mL premix, 1,000 mg, Intravenous, Once, Trifan, Carola Rhine, MD  Current Outpatient Medications:    acetaminophen (TYLENOL) 500  MG tablet, Take 500 mg by mouth every 6 (six) hours as needed (pain). , Disp: , Rfl:    ammonium lactate (AMLACTIN) 12 % lotion, Apply 1 application topically as needed for dry skin., Disp: 400 g, Rfl: 0   aspirin 81 MG chewable tablet, Chew 1 tablet (81 mg total) by mouth daily., Disp: , Rfl:    carvedilol (COREG) 25 MG tablet, Take 1 tablet (25 mg total) by mouth 2 (two) times daily with a meal., Disp: , Rfl:     ferrous sulfate 325 (65 FE) MG tablet, Take 1 tablet (325 mg total) by mouth 2 (two) times daily with a meal., Disp: 60 tablet, Rfl: 0   fluticasone (FLONASE) 50 MCG/ACT nasal spray, Place 1 spray into both nostrils daily as needed for allergies or rhinitis., Disp: , Rfl:    hydrALAZINE (APRESOLINE) 50 MG tablet, Take 1 tablet (50 mg total) by mouth every 8 (eight) hours., Disp: , Rfl:    hydrocerin (EUCERIN) CREA, Apply 1 application topically daily., Disp: 228 g, Rfl: 0   hydrocortisone (ANUSOL-HC) 2.5 % rectal cream, Place 1 application rectally every 12 (twelve) hours as needed for hemorrhoids., Disp: , Rfl: 0   hydrOXYzine (ATARAX/VISTARIL) 25 MG tablet, Take 1 tablet (25 mg total) by mouth 3 (three) times daily as needed for itching., Disp: , Rfl: 0   Infant Care Products (DERMACLOUD) OINT, Apply 1 application topically See admin instructions. To buttocks bid and after incontinent episodes., Disp: , Rfl:    isosorbide mononitrate (IMDUR) 30 MG 24 hr tablet, Take 1 tablet (30 mg total) by mouth daily., Disp: , Rfl:    nystatin (MYCOSTATIN/NYSTOP) powder, Apply 1 application topically See admin instructions. Under both breasts daily after bath until resolved, Disp: , Rfl:    polyethylene glycol (MIRALAX / GLYCOLAX) 17 g packet, Take 17 g by mouth daily as needed., Disp: , Rfl: 0   potassium chloride (KLOR-CON) 10 MEQ tablet, Take 10 mEq by mouth 2 (two) times daily., Disp: , Rfl:    torsemide (DEMADEX) 100 MG tablet, Take 100 mg by mouth 2 (two) times daily. (Patient not taking: No sig reported), Disp: , Rfl:    witch hazel-glycerin (TUCKS) pad, Apply topically as needed for itching, hemorrhoids or irritation., Disp: , Rfl:   Exam: Current vital signs: BP 139/74   Pulse 62   Resp 15   LMP 03/17/2015   SpO2 93%  Vital signs in last 24 hours: Pulse Rate:  [62] 62 (08/28 1700) Resp:  [15] 15 (08/28 1700) BP: (139)/(74) 139/74 (08/28 1700) SpO2:  [93 %] 93 % (08/28 1700)  GENERAL: Frail  appearing female on EMS stretcher, in no acute distress Head: Normocephalic with some dried blood to face and abrasions noted to lip without active hemorrhage EENT: Dry mucous membranes, poor dentition, no OP obstruction LUNGS: Normal respiratory effort. Non-labored breathing on room air. CV: No pedal edema noted, regular rate on telemetry ABDOMEN: Soft, non-tender, non-distended Extremities: cool to touch, without obvious deformity  NEURO:  Mental Status: Patient is drowsy, will wake to loud voice. Poor attention noted.  She is able to state her name and that she is in the hospital but does not answer further orientation questions. When asked why she is in the hospital she shrugs her shoulders.  She is unable to provide a clear and coherent history of present illness. Speech/Language: speech is dysarthric.  She is able to name 2/3 objects: pen and watch but does not attempt to name further  objects.  She does not attempt to repeat phrases.    No neglect is noted. During assessment, it is noted that approximately every 20-30 seconds, she has speech and behavioral arrest and a left arm twitching that lasts 2-3 seconds each.  Cranial Nerves:  II: PERRL 2 mm/brisk.  III, IV, VI: EOMI. Will fixate and track examiner in bilateral lateral visual fields.  V: Sensation is intact to light touch and symmetrical to face. Blinks to threat throughout.  VII: Face is symmetric resting and smiling.  VIII: Hearing is intact to voice IX, X: Palate elevation is symmetric. Phonation normal.  XI: Normal sternocleidomastoid and trapezius muscle strength XII: Tongue protrudes midline without fasciculations.   Motor: Patient will initiate antigravity movements of bilateral upper extremities but is unable to sustain antigravity movements. No asymmetry noted.  Patient does not attempt antigravity movement of bilateral lower extremities to command but wiggles does to command. Tone is normal. Bulk is decreased  throughout.  Sensation: Intact to light touch bilaterally in all four extremities. Coordination: Unable to assess due to patient condition DTRs: 2+ and symmetric biceps and brachioradialis Gait: Deferred  NIHSS: 1a Level of Conscious.: 1 1b LOC Questions: 2 1c LOC Commands: 1 2 Best Gaze: 0 3 Visual: 0 4 Facial Palsy: 0 5a Motor Arm - left: 2 5b Motor Arm - Right: 2 6a Motor Leg - Left: 3 6b Motor Leg - Right: 3 7 Limb Ataxia: 0 8 Sensory: 0 9 Best Language: 1 10 Dysarthria: 1 11 Extinct. and Inatten.: 0 TOTAL: 16  Labs I have reviewed labs in epic and the results pertinent to this consultation are: CBC    Component Value Date/Time   WBC 16.2 (H) 03/06/2021 1603   RBC 3.56 (L) 03/10/2021 1603   HGB 10.5 (L) 03/05/2021 1608   HCT 31.0 (L) 02/11/2021 1608   HCT 29.5 (L) 11/06/2019 2140   PLT 262 02/16/2021 1603   MCV 88.8 03/03/2021 1603   MCH 27.8 02/16/2021 1603   MCHC 31.3 03/08/2021 1603   RDW 14.8 02/15/2021 1603   LYMPHSABS 0.4 (L) 02/25/2021 1603   MONOABS 0.9 02/10/2021 1603   EOSABS 0.0 02/11/2021 1603   BASOSABS 0.0 03/06/2021 1603   CMP     Component Value Date/Time   NA 143 03/05/2021 1608   NA 146 12/10/2019 0000   K 4.8 03/06/2021 1608   CL 96 (L) 03/04/2021 1608   CO2 29 02/14/2021 1603   GLUCOSE 169 (H) 02/18/2021 1608   BUN >130 (H) 03/04/2021 1608   BUN 32 (A) 12/10/2019 0000   CREATININE 7.40 (H) 03/06/2021 1608   CREATININE 1.21 (H) 08/15/2016 1656   CALCIUM 9.7 03/06/2021 1603   PROT 9.3 (H) 03/02/2021 1603   ALBUMIN 2.8 (L) 02/19/2021 1603   AST 26 02/13/2021 1603   ALT 12 02/15/2021 1603   ALKPHOS 133 (H) 02/13/2021 1603   BILITOT 0.9 02/18/2021 1603   GFRNONAA 6 (L) 03/09/2021 1603   GFRNONAA 48 (L) 08/15/2016 1656   GFRAA 30 (L) 03/18/2020 1254   GFRAA 56 (L) 08/15/2016 1656   Lipid Panel     Component Value Date/Time   CHOL 153 08/10/2015 0857   TRIG 82 08/10/2015 0857   HDL 61 08/10/2015 0857   CHOLHDL 2.5 08/10/2015  0857   VLDL 16 08/10/2015 0857   LDLCALC 76 08/10/2015 0857   Imaging I have reviewed the images obtained:  CT-scan of the brain 02/23/2021: 1. Several small hyperdense foci in the cerebrum and cerebellum  likely reflecting metastases. No edema. Brain MRI without and with contrast is recommended for further evaluation. 2. No evidence of an acute infarct or definite intracranial hemorrhage. ASPECTS of 10.  CT cervical spine 03/02/2021: 1. No evidence of acute fracture or traumatic subluxation in the cervical spine. 2. Biapical lung nodules including a new 1.8 cm nodule in the left upper lobe concerning for progressive metastatic disease. 3. Bilateral cervical lymphadenopathy concerning for metastatic disease.  MRI examination of the brain, MRA head and neck 03/02/2021: 1. Scattered small acute infarcts in both cerebral hemispheres and right cerebellum. 2. Small subdural collections/possible subdural hematomas over both cerebral convexities. No mass effect. 3. Scattered small areas of mild edema in both cerebral hemispheres and left cerebellum corresponding to suspected metastases on CT. Recommend a complete brain MRI to include IV contrast when the patient is clinically able. 4. Motion degraded head MRA without evidence of major vessel occlusion in the anterior circulation. 5. Nonvisualization of the right vertebral artery which could be small and obscured by motion artifact or occluded.  Assessment: 65 y.o. female who presents for evaluation of aphasia s/p unwitnessed fall at her SNF. Patient visualized to have behavioral and speech arrest every 20-30 seconds during examination concerning for seizure activity.  - Examination reveals patient who is oriented only to self and place with a report of baseline orientation x 4. She has poor attention but without strength asymmetry throughout. NIHSS is 16.  - CT imaging revealed several small hyperdense foci in the cerebrum and cerebellum likely  reflecting metastases without edema but without evidence of an acute infarct with an ASPECTS of 10. CT cervical spine also revealed biapical lung nodules including a new 1.8 cm nodule in the left upper lobe concerning for progressive metastatic disease and bilateral cervical lymphadenopathy concerning for metastatic disease.  - MRI brain imaging revealed scattered small acute infarcts in both cerebral hemispheres and right cerebellum, small subdural collections/possible small SDH over both cerebral convexities without mass effect, and scattered small areas of mild edema in both cerebral hemispheres and left cerebellum corresponding to suspected metastases on CT.  - Aphasia etiology is felt to be related to brain metastases versus possible seizures and less likely to scattered acute infarcts.  - Due to patient presentation with frequent behavioral and speech arrest with a left arm twitching concerning for seizures in the setting of metastases identified on brain imaging, a STAT EEG was ordered, patient was given 2 mg of Ativan and loaded with 1 gram of Keppra. Will monitor overnight for seizures.   Impression: Concern for seizures Acute small scattered infarcts in both cerebral hemispheres and right cerebellum  Unwitnessed fall History of endometrial cancer on hospice outpatient for poor prognosis Newly identified brain metastases and lung nodules on CT imaging   Recommendations: - STAT EEG with overnight monitoring - 2 mg Ativan once  - Keppra maintenance dosing of 500 mg BID - Keppra 1,000 mg load, then 500 mg BID. Adjust as needed for renal function:    CrCl 80 to 130 mL/minute/1.73 m2: 500 mg to 1.5 g every 12 hours.  CrCl 50 to <80 mL/minute/1.73 m2: 500 mg to 1 g every 12 hours.  CrCl 30 to <50 mL/minute/1.73 m2: 250 to 750 mg every 12 hours.  CrCl 15 to <30 mL/minute/1.73 m2: 250 to 500 mg every 12 hours.  CrCl <15 mL/minute/1.73 m2: 250 to 500 mg every 24 hours (expert opinion). -  Unfortunately, unable to obtain MRI brain with contrast due to eGFR  of 6 and patient previously noted to not be a hemodialysis candidate. - Palliative care team consult for further goals of care discussion with patient on hospice since April 2022 with a poor prognosis of endometrial cancer now with multiple sites of metastases identified - Will complete stroke work up if within her goals of care  Anibal Henderson, [place] Pager: [phone]

## 2021-03-08 DIAGNOSIS — N390 Urinary tract infection, site not specified: Secondary | ICD-10-CM | POA: Diagnosis present

## 2021-03-08 DIAGNOSIS — G9340 Encephalopathy, unspecified: Secondary | ICD-10-CM | POA: Diagnosis not present

## 2021-03-08 LAB — CBC
HCT: 29.1 % — ABNORMAL LOW (ref 36.0–46.0)
Hemoglobin: 9.3 g/dL — ABNORMAL LOW (ref 12.0–15.0)
MCH: 27.9 pg (ref 26.0–34.0)
MCHC: 32 g/dL (ref 30.0–36.0)
MCV: 87.4 fL (ref 80.0–100.0)
Platelets: 268 10*3/uL (ref 150–400)
RBC: 3.33 MIL/uL — ABNORMAL LOW (ref 3.87–5.11)
RDW: 14.8 % (ref 11.5–15.5)
WBC: 19.9 10*3/uL — ABNORMAL HIGH (ref 4.0–10.5)
nRBC: 0 % (ref 0.0–0.2)

## 2021-03-08 LAB — BASIC METABOLIC PANEL WITH GFR
Anion gap: 21 — ABNORMAL HIGH (ref 5–15)
BUN: 199 mg/dL — ABNORMAL HIGH (ref 8–23)
CO2: 28 mmol/L (ref 22–32)
Calcium: 9.5 mg/dL (ref 8.9–10.3)
Chloride: 96 mmol/L — ABNORMAL LOW (ref 98–111)
Creatinine, Ser: 6.8 mg/dL — ABNORMAL HIGH (ref 0.44–1.00)
GFR, Estimated: 6 mL/min — ABNORMAL LOW
Glucose, Bld: 125 mg/dL — ABNORMAL HIGH (ref 70–99)
Potassium: 4.5 mmol/L (ref 3.5–5.1)
Sodium: 145 mmol/L (ref 135–145)

## 2021-03-08 LAB — CBG MONITORING, ED: Glucose-Capillary: 112 mg/dL — ABNORMAL HIGH (ref 70–99)

## 2021-03-08 LAB — TROPONIN I (HIGH SENSITIVITY): Troponin I (High Sensitivity): 102 ng/L

## 2021-03-08 MED ORDER — POLYVINYL ALCOHOL 1.4 % OP SOLN: 1.0000 [drp] | Freq: Four times a day (QID) | OPHTHALMIC | Status: DC | PRN

## 2021-03-08 MED ORDER — GLYCOPYRROLATE 1 MG PO TABS: 1.0000 mg | ORAL_TABLET | ORAL | Status: DC | PRN

## 2021-03-08 MED ORDER — HYDRALAZINE HCL 50 MG PO TABS
50.0000 mg | ORAL_TABLET | Freq: Three times a day (TID) | ORAL | Status: DC
  Administered 2021-03-08: 50 mg via ORAL
  Filled 2021-03-08: qty 2

## 2021-03-08 MED ORDER — HALOPERIDOL LACTATE 5 MG/ML IJ SOLN: 0.5000 mg | INTRAMUSCULAR | Status: DC | PRN

## 2021-03-08 MED ORDER — LORAZEPAM 2 MG/ML PO CONC: 1.0000 mg | ORAL | Status: DC | PRN

## 2021-03-08 MED ORDER — GLYCOPYRROLATE 0.2 MG/ML IJ SOLN
0.2000 mg | INTRAMUSCULAR | Status: DC | PRN
  Administered 2021-03-11: 0.2 mg via INTRAVENOUS
  Filled 2021-03-08: qty 1

## 2021-03-08 MED ORDER — ONDANSETRON HCL 4 MG/2ML IJ SOLN: 4.0000 mg | Freq: Four times a day (QID) | INTRAMUSCULAR | Status: DC | PRN

## 2021-03-08 MED ORDER — DIPHENHYDRAMINE HCL 50 MG/ML IJ SOLN: 12.5000 mg | INTRAMUSCULAR | Status: DC | PRN

## 2021-03-08 MED ORDER — LORAZEPAM 2 MG/ML IJ SOLN
1.0000 mg | INTRAMUSCULAR | Status: DC | PRN
  Administered 2021-03-10 (×2): 1 mg via INTRAVENOUS
  Filled 2021-03-08 (×2): qty 1

## 2021-03-08 MED ORDER — SODIUM CHLORIDE 0.9 % IV SOLN: 1.0000 g | INTRAVENOUS | Status: DC

## 2021-03-08 MED ORDER — HALOPERIDOL 0.5 MG PO TABS: 0.5000 mg | ORAL_TABLET | ORAL | Status: DC | PRN

## 2021-03-08 MED ORDER — ASPIRIN EC 81 MG PO TBEC
81.0000 mg | DELAYED_RELEASE_TABLET | Freq: Every day | ORAL | Status: DC
  Filled 2021-03-08: qty 1

## 2021-03-08 MED ORDER — SODIUM CHLORIDE 0.9 % IV SOLN
1.0000 g | INTRAVENOUS | Status: DC
  Administered 2021-03-08: 1 g via INTRAVENOUS
  Filled 2021-03-08: qty 10

## 2021-03-08 MED ORDER — HYDROMORPHONE HCL 1 MG/ML IJ SOLN
0.5000 mg | INTRAMUSCULAR | Status: DC | PRN
  Administered 2021-03-09 – 2021-03-10 (×6): 0.5 mg via INTRAVENOUS
  Administered 2021-03-11 (×2): 1 mg via INTRAVENOUS
  Administered 2021-03-11 – 2021-03-12 (×5): 0.5 mg via INTRAVENOUS
  Filled 2021-03-08 (×3): qty 0.5
  Filled 2021-03-08: qty 1
  Filled 2021-03-08 (×5): qty 0.5
  Filled 2021-03-08: qty 1
  Filled 2021-03-08 (×3): qty 0.5

## 2021-03-08 MED ORDER — LEVETIRACETAM IN NACL 500 MG/100ML IV SOLN
500.0000 mg | Freq: Two times a day (BID) | INTRAVENOUS | Status: DC
  Administered 2021-03-08 – 2021-03-12 (×10): 500 mg via INTRAVENOUS
  Filled 2021-03-08 (×10): qty 100

## 2021-03-08 MED ORDER — HALOPERIDOL LACTATE 2 MG/ML PO CONC: 0.5000 mg | ORAL | Status: DC | PRN

## 2021-03-08 MED ORDER — GLYCOPYRROLATE 0.2 MG/ML IJ SOLN: 0.2000 mg | INTRAMUSCULAR | Status: DC | PRN

## 2021-03-08 MED ORDER — CARVEDILOL 12.5 MG PO TABS
25.0000 mg | ORAL_TABLET | Freq: Two times a day (BID) | ORAL | Status: DC
  Filled 2021-03-08: qty 2

## 2021-03-08 MED ORDER — HYDROMORPHONE HCL 1 MG/ML IJ SOLN: 1.0000 mg | INTRAMUSCULAR | Status: DC | PRN

## 2021-03-08 MED ORDER — LORAZEPAM 1 MG PO TABS: 1.0000 mg | ORAL_TABLET | ORAL | Status: DC | PRN

## 2021-03-08 MED ORDER — ONDANSETRON 4 MG PO TBDP: 4.0000 mg | ORAL_TABLET | Freq: Four times a day (QID) | ORAL | Status: DC | PRN

## 2021-03-08 NOTE — ED Notes (Signed)
Pts daughter and husband at the bedside at this time.

## 2021-03-08 NOTE — Hospital Course (Addendum)
Patient on comfort care and awaiting hospice bed.  Continue Keppra for seizure precaution per neurology  Dilaudid for pain Q2N PRN  Patient positive for ESBL bacturia- family has decided not to treat at this time since it is not causing discomfort to the patient

## 2021-03-08 NOTE — Progress Notes (Addendum)
Subjective: I seen and evaluated Vanessa [name] at bedside.  She was lying comfortably in bed.  She denies any pain at this time.  She is alert and oriented x2.  She does not recall why she is in the hospital.  Objective:  Vital signs in last 24 hours: Vitals:   03/08/21 0400 03/08/21 0415 03/08/21 0430 03/08/21 0630  BP: (!) 183/81 (!) 187/78 (!) 183/80 (!) 182/69  Pulse: 73 74 70   Resp: 13 14 10 14   Temp: 97.7 F (36.5 C) 97.9 F (36.6 C) 97.8 F (36.6 C) (!) 95.2 F (35.1 C)  TempSrc:      SpO2: 100% 100% 97%   Weight:       Physical Exam Constitutional:      General: She is awake.     Appearance: Normal appearance.     Interventions: Nasal cannula in place.  HENT:     Head: Normocephalic and atraumatic.  Cardiovascular:     Rate and Rhythm: Normal rate and regular rhythm.     Heart sounds: Normal heart sounds.  Pulmonary:     Effort: Pulmonary effort is normal.     Breath sounds: Normal breath sounds.  Abdominal:     Palpations: Abdomen is soft.  Musculoskeletal:     Right lower leg: No edema.     Left lower leg: No edema.  Skin:    General: Skin is warm and dry.  Neurological:     Mental Status: She is alert. She is disoriented.  Psychiatric:     Comments: Patient was awake and able to answer yes or no questions. However, could not recall recent events that led her to be hospitalized.     Assessment/Plan:  Principal Problem:   Acute encephalopathy  Vanessa [name] is a 65 y.o. female with a PMHx of CKD stage IV (does not want and is not a candidate for dialysis), metastatic endometrial cancer status post palliative radiation, T2DM, HLD, HTN, combined systolic and diastolic CHF with EF of 64-33%, cardiorenal syndrome, atrial fibrillation (not on anticoagulation d/t chronic bleeding from endometrial cancer), anemia of chronic disease, and MSSA bacteremia who is currently in hospice care now with acute encephalopathy, multifactorial in nature, admitted for  medical management of her comorbidities.  Acute encephalopathy with unwitnessed fall Hospice care and DNR patient  EEG revealed mild to moderate diffuse encephalopathy; no seizures occurred during evaluation. Patient received Keppra loading dose 1000mg . Uremia likely causing current altered status. Patient not a candidate for HD. Patients daughter will be coming to the hospital later today to discuss EOL goals of care. Strong consideration for comfort care measures. --Dilaudid 1mg  Q2H PRN --Pending palliative care eval --maintenance dose Keppra 500mg  BID   Sepsis with multiple organ dysfunction UTI Patient meets SIRS criteria with multiple organ dysfunction; Cr of 7.3; Brain dysfunction evidenced by MRI revealing small scattered infarcts. UA revealed mod Hgb, large leukocytes, >50 WBC and many bacteria. UTI likely the source of infection. Blood culture shows no growth. Patient follows hospice, adequate antibiotics to treat UTI will suffice. Discontinued vancomycin and Zosyn --1g Ceftriaxone for three days; end date: 03/10/21 --Urine culture Pending  Metastatic endometrial cancer Acute small scattered infarcts, right cerebellum and bilateral cerebral hemispheres Patient with multiple new brain mets, lung nodules, and cervical lymphadenopathy.  She did have some acute small scattered infarcts on imaging. Patient is already s/p 1x Decardron and will continue seizure prophylaxis per neurology.  --Maintenance Keppra 500mg  BID  CKD IV approaching ESRD H/o  cardiorenal syndrome Baseline creatinine 3.6-3.7. Creatinine of 7.3, BUN 200. Per review of records, patient does not wish to pursue and is not a candidate for HD.   Combined systolic and diastolic HF  Last echo in January 2022 showed EF 35-40%. On torsemide regimen at home. No signs of volume overload on exam today. Holding home meds due to NPO status --Monitor volume status  Anemia of Chronic Disease Hgb 10.5 , MCV 88. On ferrous sulfate  regimen at home which we are currently holding due to NPO status.  HTN BP in [id number] systolic since arrival. Patient is on carvedilol 25 mg PO BID, hydralazine 50 mg, PO q8h, and imdur 30 mg PO daily. Elevated pressures overnight --hydralazine 50mg  Q8H --Carvedilol 25mg  BID  T2DM Last A1c of 5.5 in April 2022, not on any medications at home.     Prior to Admission Living Arrangement: Anticipated Discharge Location: Barriers to Discharge: Dispo: Anticipated discharge in approximately 3-4 day(s).   Timothy Lasso, MD 03/08/2021, 7:34 AM Pager: [phone] After 5pm on weekdays and 1pm on weekends: On Call pager [phone]

## 2021-03-08 NOTE — Progress Notes (Signed)
Pt admitted to room from ED; report given by Ubaldo Glassing, RN.  Pt reports no pain. Patient given CHG bath, patient foley care performed.  Patient bed alarm set.  Patient oriented to room and call bell.  Patient oriented x 2 (day and month of birth and current year).  Patient not aware of situation or location, but reoriented.  Pt PIV x 2 with no complications. Pt. Is NPO status.

## 2021-03-08 NOTE — ED Notes (Signed)
Pt removed monitoring leads again

## 2021-03-08 NOTE — ED Notes (Signed)
Pt sitting in bed with gown off and refusing to put it on. Dr. Is aware.

## 2021-03-08 NOTE — Progress Notes (Signed)
Vanessa [name] ED 09: [facility]) Hospitalized Hospice Patient  Vanessa [name] is a current hospice patient (pt) with [facility], admitted on 10/16/20 with a terminal diagnosis of hypertensive heart failure.  Pt was transported on 03/09/2021 to [facility] as a code stroke and was admitted with a diagnosis of altered mental status.  [facility] was not notified prior to transport.  Per Dr. Gildardo Cranker, [facility] MD, this is a related admission.  Visited at bedside around 2pm.  Pt responded to voice, noted to be lethargic and delayed with responses.  Pt denied pain, SOB, denied interest in food or water. Respirations even and unlabored, no signs of distress noted.  Report exchanged with bedside RN and attending MD.  Spoke by phone with pt's daughter, [name], who confirmed meeting with hospitalist and electing comfort care approach but still wishing for abx to treat suspected UTI.  Discussed briefly that pt would need more care after discharge than assisted living.   This pt is appropriate for continued general inpatient status due to the need for ongoing IV antibiotics.  Vital Signs: 97.9 bladder, 159/70 (97), 64 HR, 14 RR, 93% 2L [place]  I&O's:  173/700  Abnormal Labs:   Cl 96, BUN 199, Creat 6.80, GFR 6,  AST 133, Albumin 2.8, total prot 9.3, Troponin 102, WBC 19.9, Hgb 9.3  Diagnostics: CT Head Code Stroke WO contrast: IMPRESSION: 1. Several small hyperdense foci in the cerebrum and cerebellum likely reflecting metastases. No edema. Brain MRI without and with contrast is recommended for further evaluation. 2. No evidence of an acute infarct or definite intracranial hemorrhage. ASPECTS of 10.   CT C-spine without contrast: IMPRESSION: 1. No evidence of acute fracture or traumatic subluxation in the cervical spine. 2. Biapical lung nodules including a new 1.8 cm nodule in the left upper lobe concerning for progressive metastatic disease. 3. Bilateral cervical lymphadenopathy concerning for  metastatic disease.   MR brain without contrast and MRA head WO contrast: IMPRESSION: 1. Scattered small acute infarcts in both cerebral hemispheres and right cerebellum. 2. Small subdural collections/possible subdural hematomas over both cerebral convexities. No mass effect. 3. Scattered small areas of mild edema in both cerebral hemispheres and left cerebellum corresponding to suspected metastases on CT. Recommend a complete brain MRI to include IV contrast when the patient is clinically able. 4. Motion degraded head MRA without evidence of major vessel occlusion in the anterior circulation. 5. Nonvisualization of the right vertebral artery which could be small and obscured by motion artifact or occluded.   DG Port Chest 1 view:  IMPRESSION: 1. Increased pulmonary metastatic disease.  Problem List: Acute encephalopathy with unwitnessed fall Hospice care and DNR patient  EEG revealed mild to moderate diffuse encephalopathy; no seizures occurred during evaluation. Patient received Keppra loading dose 1000mg . Uremia likely causing current altered status. Patient not a candidate for HD. Patients daughter will be coming to the hospital later today to discuss EOL goals of care. Strong consideration for comfort care measures. --Dilaudid 1mg  Q2H PRN --Pending palliative care eval --maintenance dose Keppra 500mg  BID    Sepsis with multiple organ dysfunction UTI Patient meets SIRS criteria with multiple organ dysfunction; Cr of 7.3; Brain dysfunction evidenced by MRI revealing small scattered infarcts. UA revealed mod Hgb, large leukocytes, >50 WBC and many bacteria. UTI likely the source of infection. Blood culture shows no growth. Patient follows hospice, adequate antibiotics to treat UTI will suffice. Discontinued vancomycin and Zosyn --1g Ceftriaxone for three days; end date: 03/10/21 --Urine culture Pending  Metastatic endometrial cancer Acute small scattered infarcts, right  cerebellum and bilateral cerebral hemispheres Patient with multiple new brain mets, lung nodules, and cervical lymphadenopathy.  She did have some acute small scattered infarcts on imaging. Patient is already s/p 1x Decardron and will continue seizure prophylaxis per neurology.  --Maintenance Keppra 500mg  BID   CKD IV approaching ESRD H/o cardiorenal syndrome Baseline creatinine 3.6-3.7. Creatinine of 7.3, BUN 200. Per review of records, patient does not wish to pursue and is not a candidate for HD.    Combined systolic and diastolic HF  Last echo in January 2022 showed EF 35-40%. On torsemide regimen at home. No signs of volume overload on exam today. Holding home meds due to NPO status --Monitor volume status  Discharge Planning: Unclear at this time if pt will return to [facility].  May discuss [place] with family.  Family Contact: Spoke with daughter [name] by phone  IDT: Updated  Goals of Care: Treat UTI in case it is source of discomfort, otherwise comfort care only.  Should pt need ambulance service at time of discharge please use [facility] as [facility] contracts with them for our hospitalized pts.  Thank you for the opportunity to participate in this patient's care.     [name], BSN, RN [facility] Liaison (listed on [place] under Hospice/[facility])    [phone] (24h on call)

## 2021-03-08 NOTE — ED Notes (Signed)
Bair hugger placed on patient at this time. Pt agrees to leave it in place.

## 2021-03-08 NOTE — ED Notes (Signed)
Patient's husband at the bedside

## 2021-03-08 NOTE — ED Notes (Signed)
Message sent to pharmacy for zosyn

## 2021-03-08 NOTE — ED Notes (Signed)
Pt removed monitoring leads; monitoring leads replaced. Pt also refusing gown and blanket.

## 2021-03-08 NOTE — ED Notes (Signed)
Pt removed all monitoring leads and both IVs. IV replaced, leads replaced

## 2021-03-09 DIAGNOSIS — G9340 Encephalopathy, unspecified: Secondary | ICD-10-CM | POA: Diagnosis not present

## 2021-03-09 LAB — URINE CULTURE
Culture: >=100000 — AB
Special Requests: NORMAL

## 2021-03-09 MED ORDER — FOSFOMYCIN TROMETHAMINE 3 G PO PACK
3.0000 g | PACK | Freq: Once | ORAL | Status: AC
  Administered 2021-03-09: 3 g via ORAL
  Filled 2021-03-09: qty 3

## 2021-03-09 NOTE — Progress Notes (Addendum)
Bair hugger has been placed at pt's bedside. No blanket for bear hugger at this time. Unit secretary has ordered blanket for pt.

## 2021-03-09 NOTE — Progress Notes (Addendum)
NT reported that pt's temp was 92.2 F rectally. Rechecked by nurse, temp 92.9 F. Internal medicine residency paged. Pt is alert and oriented x3.

## 2021-03-09 NOTE — Progress Notes (Addendum)
[name] [id number]: [facility]) Hospitalized Hospice Patient  Vanessa [name] is a current hospice patient (pt) with [facility], admitted on 10/16/20 with a terminal diagnosis of hypertensive heart failure.  Pt was transported on 03/05/2021 to [facility] as a code stroke and was admitted with a diagnosis of altered mental status.  [facility] was not notified prior to transport.  Per Dr. Gildardo Cranker, [facility] MD, this is a related admission.  Visited at bedside. Pt resting, did not respond to gentle touch.  Resp shallow, even, no distress noted. Retail banker in place. Report exchanged with bedside RN and MD. Unable to reach pt's daughter by phone.  This pt is appropriate for continued general inpatient status due to the need for ongoing IV antiepileptics and PRN IV meds to ensure comfort.   Vital Signs: 972.6 rectal, 130/58 (78), 52 HR, 15 RR, 94% 2L [place]  I&O's: 376/1425  Abnormal Labs:   none new  IV/PRN meds:  keppra 1000mg  PIV BID, dilaudid 0.5-1 mg PIV q2h PRN pain (x 1 dose)  Diagnostics: no new scans   Problem List:   Principal Problem:   Acute encephalopathy Active Problems:   Endometrial cancer ([place])   Acute renal failure superimposed on stage 4 chronic kidney disease ([place])   DNR (do not resuscitate)   UTI (urinary tract infection)  Discharge Planning: Hospital death anticipated  Family Contact: left voicemail for daughter [name]  IDT: Updated  Goals of Care: Treat UTI in case it is source of discomfort, otherwise comfort care only.  Should pt need ambulance service at time of discharge please use [facility] as [facility] contracts with them for our hospitalized pts.  Thank you for the opportunity to participate in this patient's care.     [name], BSN, RN [facility] Liaison (listed on [place] under Hospice/[facility])    [phone] (24h on call)

## 2021-03-09 NOTE — Progress Notes (Signed)
Bair hugger placed on pt. Will recheck temp momentarily.

## 2021-03-09 NOTE — Progress Notes (Signed)
Received call from IM resident and made aware of pt's low temp. Resident stated she will pass message along to the primary team and will inform nurse of any new orders.

## 2021-03-09 NOTE — Progress Notes (Addendum)
   Summary:  Vanessa [name] is a 65 year old female on hospice with metastatic endometrial cancer and ESRD who was admitted after being found down at her assisted living facility unresponsive. She is being treated for UTI. She is now awaiting placement. She appears comfortable at this time.  Awaiting hospice placement.  Subjective:  No significant overnight events. Did no receive any dilaudid overnight. Denies pain   Objective:  Vital signs in last 24 hours: Vitals:   03/08/21 2100 03/08/21 2200 03/08/21 2300 03/09/21 0400  BP: (!) 154/69 (!) 148/71 (!) 156/70 (!) 155/67  Pulse: 68 65 61 (!) 54  Resp: 11 15 16 17   Temp:   (!) 97.5 F (36.4 C)   TempSrc:   Oral   SpO2: 93% 96% 93% 93%  Weight:       Physical Exam Resting comfortably in bed, in no acute distress Extremities warm  Assessment/Plan:  Principal Problem:   Acute encephalopathy Active Problems:   Endometrial cancer (HCC)   Acute renal failure superimposed on stage 4 chronic kidney disease (HCC)   DNR (do not resuscitate)   UTI (urinary tract infection)  Assessment  Comfort care, DNR ESBL Bacturia--she appears comfortable today. I spoke with her daughter about the option to treat this or not. Her daughter agreed to hold off on treating right now if not contributing to her suffering. She wishes for her mother to be as comfortable as possible.  Metastatic endometrial carcinoma ESRD  Plan Continue comfort meds  Will continue keppra for seizure prophylaxis Appreciate TOC, hospice and palliative team   Prior to Admission Living Arrangement: ALF Anticipated Discharge Location: Hospice Barriers to Discharge: placement Dispo: pending bed placement Last family update: 8/30  Mitzi Hansen, MD Internal Medicine Resident PGY-3 [name] Residency Pager: [phone] 03/09/2021 1:52 PM

## 2021-03-09 NOTE — Plan of Care (Signed)
  Problem: Education: Goal: Knowledge of General Education information will improve Description: Including pain rating scale, medication(s)/side effects and non-pharmacologic comfort measures Outcome: Not Progressing   Problem: Activity: Goal: Risk for activity intolerance will decrease Outcome: Not Progressing   Problem: Nutrition: Goal: Adequate nutrition will be maintained Outcome: Not Progressing   

## 2021-03-10 DIAGNOSIS — G9340 Encephalopathy, unspecified: Secondary | ICD-10-CM | POA: Diagnosis not present

## 2021-03-10 NOTE — Progress Notes (Signed)
   Subjective: I seen and evaluated Vanessa [name] at bedside.  She states she is feeling okay.  She has no acute complaints at this time.  Objective:  Vital signs in last 24 hours: Vitals:   03/09/21 0819 03/09/21 1112 03/09/21 2211 03/10/21 0751  BP:   132/64 (!) 146/62  Pulse:   (!) 50 (!) 51  Resp:  15 20 16   Temp: (!) 92.9 F (33.8 C) (!) 92.6 F (33.7 C) (!) 92.3 F (33.5 C) (!) 92.4 F (33.6 C)  TempSrc: Rectal Rectal Rectal Rectal  SpO2:   94% 92%  Weight:       Physical Exam HENT:     Head: Normocephalic and atraumatic.  Skin:    General: Skin is warm and dry.  Neurological:     Mental Status: She is alert. Mental status is at baseline.  Psychiatric:        Behavior: Behavior is cooperative.     Assessment/Plan:  Principal Problem:   Acute encephalopathy Active Problems:   Endometrial cancer ([place])   Acute renal failure superimposed on stage 4 chronic kidney disease ([place])   DNR (do not resuscitate)   UTI (urinary tract infection)  Comfort care, DNR ESBL Bacteruria Metastatic endometrial carcinoma ESRD Family decided not to treat ESBL bacteriuria since it is not contributing to her suffering. --Tylenol 650mg  Q6H --Haldol .5mg  Q4h PRN --Dilaudid -.5mg -1mg  Q2H PRN --Ativan 1mg  Q4H --Keppra for seizure prophylaxis --Continue all comfort measures as needed --Awaiting hospice placement    Prior to Admission Living Arrangement: Anticipated Discharge Location: Barriers to Discharge: Dispo: Anticipated discharge in approximately 1-2 day(s).   Timothy Lasso, MD 03/10/2021, 2:29 PM Pager: [phone] After 5pm on weekdays and 1pm on weekends: On Call pager [phone]

## 2021-03-10 NOTE — Progress Notes (Signed)
Patient ID: Vanessa [name], female   DOB: 05/28/1956, 65 y.o.   MRN: [id number]       Consult received. Chart reviewed. Spoke with Chrislyn from [facility], who states there is no need for PMT to see patient at this time. They will handle any symptom management and GOC needs.     Elie Confer, NP-C Palliative Medicine   Please call Palliative Medicine team phone with any questions [phone]. For individual providers please see AMION.   No charge

## 2021-03-10 NOTE — Progress Notes (Addendum)
[name] [id number]: [facility]) Hospitalized Hospice Patient  Vanessa [name] is a current hospice patient (pt) with [facility], admitted on 10/16/20 with a terminal diagnosis of hypertensive heart failure.  Pt was transported on 02/22/2021 to [facility] as a code stroke and was admitted with a diagnosis of altered mental status.  [facility] was not notified prior to transport.  Per Dr. Gildardo Cranker, [facility] MD, this is a related admission.  Visited at bedside. Pt resting, did not respond to gentle touch.  Resp shallow, even, no distress noted. Report exchanged with bedside RN and MD. Unable to reach pt's daughter by phone.  This pt is appropriate for continued general inpatient status due to the need for ongoing IV antiepileptics and PRN IV meds to ensure comfort.   Vital Signs: 92.4 rectal, 146/62 (82), 51HR, 16 RR, 92% 2L [place]  I&O's: 130/600  Abnormal Labs:   none new  IV/PRN meds:  keppra 1000mg  PIV BID, dilaudid 0.5-1 mg PIV q2h PRN pain (x 4 doses in 24 h dose), ativan  1 mg PIV q4h PRN (2doses in 24/hr)  Diagnostics: no new scans   Problem List:   Principal Problem:   Acute encephalopathy Active Problems:   Endometrial cancer ([place])   Acute renal failure superimposed on stage 4 chronic kidney disease ([place])   DNR (do not resuscitate)   UTI (urinary tract infection)  Discharge Planning: Hospital death anticipated.  Unable to offer [facility] since daughter is unreachable.  Per bedside RN no family has been present at bedside.  Family Contact: left voicemail for daughter [name]  IDT: Updated  Goals of Care: Treat UTI in case it is source of discomfort, otherwise comfort care only.  Should pt need ambulance service at time of discharge please use [facility] as [facility] contracts with them for our hospitalized pts.  Thank you for the opportunity to participate in this patient's care.     [name], BSN, RN [facility] Liaison (listed on [place] under Hospice/[facility])     [phone] (24h on call)

## 2021-03-11 DIAGNOSIS — G9340 Encephalopathy, unspecified: Secondary | ICD-10-CM | POA: Diagnosis not present

## 2021-03-11 MED ORDER — ORAL CARE MOUTH RINSE
15.0000 mL | Freq: Two times a day (BID) | OROMUCOSAL | Status: DC
  Administered 2021-03-11 – 2021-03-12 (×4): 15 mL via OROMUCOSAL

## 2021-03-11 MED ORDER — SODIUM CHLORIDE 0.9 % IV SOLN
INTRAVENOUS | Status: DC | PRN
Start: 2021-03-11 — End: 2021-03-13
  Administered 2021-03-11: 250 mL via INTRAVENOUS

## 2021-03-11 MED ORDER — CHLORHEXIDINE GLUCONATE 0.12 % MT SOLN
15.0000 mL | Freq: Two times a day (BID) | OROMUCOSAL | Status: DC
  Administered 2021-03-11 – 2021-03-12 (×4): 15 mL via OROMUCOSAL
  Filled 2021-03-11 (×4): qty 15

## 2021-03-11 NOTE — Progress Notes (Addendum)
HD#4 Subjective:  Overnight Events: Unremarkable  I have seen and evaluated [name] with attending Dr. Evette Doffing at bedside. Patient reported feeling alright today. When asked if she has anyone this [place] could call, patient stated that she does not have family or a preacher, and that she does not need [place] to call anyone.   Objective:  Vital signs in last 24 hours: Vitals:   03/09/21 0819 03/09/21 1112 03/09/21 2211 03/10/21 0751  BP:   132/64 (!) 146/62  Pulse:   (!) 50 (!) 51  Resp:  15 20 16   Temp: (!) 92.9 F (33.8 C) (!) 92.6 F (33.7 C) (!) 92.3 F (33.5 C) (!) 92.4 F (33.6 C)  TempSrc: Rectal Rectal Rectal Rectal  SpO2:   94% 92%  Weight:       Supplemental O2: Nasal Cannula SpO2: 92 % O2 Flow Rate (L/min): 2 L/min   Physical Exam:  Physical Exam Vitals and nursing note reviewed.  HENT:     Head: Normocephalic.  Eyes:     Extraocular Movements: Extraocular movements intact.  Cardiovascular:     Rate and Rhythm: Bradycardia present.  Pulmonary:     Breath sounds: Normal breath sounds.  Abdominal:     Palpations: Abdomen is soft.  Neurological:     Mental Status: She is alert.    Filed Weights   02/12/2021 1750  Weight: 175 lb (79.4 kg)     Intake/Output Summary (Last 24 hours) at 03/11/2021 1524 Last data filed at 03/11/2021 1049 Gross per 24 hour  Intake 371.22 ml  Output 600 ml  Net -228.78 ml   Net IO Since Admission: -1,297.62 mL [03/11/21 1524]  Pertinent Labs: CBC Latest Ref Rng & Units 03/08/2021 02/27/2021 03/02/2021  WBC 4.0 - 10.5 K/uL 19.9(H) - 16.2(H)  Hemoglobin 12.0 - 15.0 g/dL 9.3(L) 10.5(L) 9.9(L)  Hematocrit 36.0 - 46.0 % 29.1(L) 31.0(L) 31.6(L)  Platelets 150 - 400 K/uL 268 - 262    CMP Latest Ref Rng & Units 03/08/2021 02/20/2021 02/20/2021  Glucose 70 - 99 mg/dL 125(H) 169(H) 174(H)  BUN 8 - 23 mg/dL 199(H) >130(H) 200(H)  Creatinine 0.44 - 1.00 mg/dL 6.80(H) 7.40(H) 7.30(H)  Sodium 135 - 145 mmol/L 145 143 143  Potassium  3.5 - 5.1 mmol/L 4.5 4.8 4.8  Chloride 98 - 111 mmol/L 96(L) 96(L) 94(L)  CO2 22 - 32 mmol/L 28 - 29  Calcium 8.9 - 10.3 mg/dL 9.5 - 9.7  Total Protein 6.5 - 8.1 g/dL - - 9.3(H)  Total Bilirubin 0.3 - 1.2 mg/dL - - 0.9  Alkaline Phos 38 - 126 U/L - - 133(H)  AST 15 - 41 U/L - - 26  ALT 0 - 44 U/L - - 12    Imaging: No results found.  Assessment/Plan:   Principal Problem:   Acute encephalopathy Active Problems:   Endometrial cancer ([place])   Acute renal failure superimposed on stage 4 chronic kidney disease ([place])   DNR (do not resuscitate)   UTI (urinary tract infection)   Patient Summary: Vanessa [name] is a 65 y.o. female with a PMHx of CKD stage IV (does not want and is not a candidate for dialysis), metastatic endometrial cancer status post palliative radiation, T2DM, HLD, HTN, combined systolic and diastolic CHF with EF of 37-10%, cardiorenal syndrome, atrial fibrillation (not on anticoagulation d/t chronic bleeding from endometrial cancer), anemia of chronic disease, and MSSA bacteremia who is currently in hospice care now with acute encephalopathy, multifactorial in nature, admitted  for medical management of her comorbidities.   Comfort care, DNR ESBL Bacteruria Metastatic endometrial carcinoma ESRD No need to treat ESBL bacteriuria since it is not contributing to her suffering. --Tylenol 650mg  Q6H --Haldol .5mg  Q4h PRN --Dilaudid -.5mg -1mg  Q2H PRN --Ativan 1mg  Q4H PRN --Keppra for seizure prophylaxis --Continue all comfort measures as needed --Anticipating in-hospital death in the coming days  Diet:  May have comfort feeds from floor stock VTE: None Code: DNR  Merrily Brittle, DO 03/11/2021, 3:24 PM Pager: [phone]  Please contact the on call pager after 5 pm and on weekends at [phone].

## 2021-03-11 NOTE — Progress Notes (Signed)
[name] [id number]: [facility]) Hospitalized Hospice Patient   Vanessa [name] is a current hospice patient (pt[facility] with [facility], admitted onto hospice services on 10/16/20 with a terminal diagnosis of hypertensive heart failure.  Pt was transported on 02/17/2021 to [facility] as a code stroke and was admitted with a diagnosis of altered mental status.  [facility] was not notified prior to transport.  Per Dr. Gildardo Cranker, [facility] MD, this is a related admission.  Visited Vanessa [name] at the bedside, she is alert to self, staring out of her window. Provided her with some water per her request, she swallowed but immediately began to cough. She advises she has not spoken to her daughter today. Unable to reach her daughter by phone.   This pt is appropriate for continued general inpatient status due to the need for ongoing IV antiepileptics and PRN IV meds to ensure comfort.   V/S: none recorded for today  I&O: 281/600 Labs: none today  Diagnostics: none today  IV/PRN meds: Keppra 500 mg IV BID, robinul 0.2 mg IV, dilaudid 1 mg IV x 1, 0.5 mg IV x 1  Problem List: Comfort care, DNR ESBL Bacteruria Metastatic endometrial carcinoma ESRD No need to treat ESBL bacteriuria since it is not contributing to her suffering. --Tylenol 650mg  Q6H --Haldol .5mg  Q4h PRN --Dilaudid -.5mg -1mg  Q2H PRN --Ativan 1mg  Q4H PRN --Keppra for seizure prophylaxis --Continue all comfort measures as needed --Anticipating in-hospital death in the coming days  Discharge Planning: Hospital death anticipated.  Unable to offer [facility] since daughter is unreachable.  Per bedside RN no family has been present at bedside.   Family Contact: left voicemail for daughter Vanessa [name]   IDT: Updated   Goals of Care: full comfort measures   Should pt need ambulance service at time of discharge please use [facility] as [facility] contracts with them for our hospitalized pts.   Thank you for the opportunity to participate in this patient's care.       Venia Carbon RN, BSN, [place] [facility] Liaison

## 2021-03-11 DEATH — deceased

## 2021-03-12 DIAGNOSIS — G9340 Encephalopathy, unspecified: Secondary | ICD-10-CM | POA: Diagnosis not present

## 2021-03-12 LAB — CULTURE, BLOOD (ROUTINE X 2): Culture: NO GROWTH

## 2021-03-12 NOTE — Progress Notes (Signed)
Vanessa [name] [id number]: [facility]) Hospitalized Hospice Patient  Vanessa [name] is a current hospice patient (pt) with [facility], admitted on 10/16/20 with a terminal diagnosis of hypertensive heart failure.  Pt was transported on 02/21/2021 to [facility] as a code stroke and was admitted with a diagnosis of altered mental status.  [facility] was not notified prior to transport.  Per Dr. Gildardo Cranker, [facility] MD, this is a related admission.  Visited at bedside. Pt did not respond to her name, vocalizing with exhales.  Pedal pulse 66, strong and regular.  Extremities warm.  Pt has been deemed eligible to transfer to [facility]. Report exchanged with bedside RN. Spoke with pt's daughter Vanessa [name], by phone who shares how hard her children, pt's grandchildren, are taking pt's decline.  Vanessa [name] verbalized agreement to transfer pt to [facility] when a bed becomes available.  [place] to reach out to [facility] to complete consents ahead of bed availability.   This pt is appropriate for continued general inpatient status due to the need for ongoing IV antiepileptics and PRN IV meds to ensure comfort.   Vital Signs: 94.7 axillary, 139/57 (80),  66 HR, 15 RR, 90% RA  I&O's: 444.2/950  Abnormal Labs:   none new  IV/PRN meds:  keppra 1000mg  PIV BID, dilaudid 0.5-1 mg PIV q2h PRN pain (x 5 doses in 24 h dose), ativan 1 mg PIV q4h PRN (0 doses in 24/hr)  Diagnostics: no new scans   Problem List:   Principal Problem:   Acute encephalopathy Active Problems:   Endometrial cancer ([place])   Acute renal failure superimposed on stage 4 chronic kidney disease (HCC)   DNR (do not resuscitate)   UTI (urinary tract infection)  Discharge Planning: [place] when bed becomes available  Family Contact: spoke with pt's daughter, Vanessa [name], and discussed transfer to [facility]  IDT: Updated  Goals of Care: [place] only  Should pt need ambulance service at time of discharge please use [facility] as [facility] with them  for our hospitalized pts.  Thank you for the opportunity to participate in this patient's care.     Vanessa [name], BSN, RN [facility] Liaison (listed on [place] under Hospice/[facility])    [phone] (24h on call)

## 2021-03-12 NOTE — Progress Notes (Addendum)
HD#5 Subjective:  Overnight Events: Unremarkable  I have seen and evaluated Vanessa [name] with attending Dr. Evette Doffing at bedside. Patient was somnolent, but responded to stimuli. Patient did not seem like she was in pain.   Objective:  Vital signs in last 24 hours: Vitals:   03/09/21 1112 03/09/21 2211 03/10/21 0751 03/12/21 0744  BP:  132/64 (!) 146/62 (!) 139/57  Pulse:  (!) 50 (!) 51 70  Resp: 15 20 16 15   Temp: (!) 92.6 F (33.7 C) (!) 92.3 F (33.5 C) (!) 92.4 F (33.6 C) (!) 94.7 F (34.8 C)  TempSrc: Rectal Rectal Rectal Axillary  SpO2:  94% 92% 90%  Weight:       Supplemental O2: Nasal Cannula SpO2: 90 % O2 Flow Rate (L/min): 2 L/min   Physical Exam:  Physical Exam Vitals and nursing note reviewed.  HENT:     Head: Normocephalic.  Cardiovascular:     Rate and Rhythm: Normal rate.  Pulmonary:     Comments: Increased respiratory efforts Abdominal:     Palpations: Abdomen is soft.    Filed Weights   03/04/2021 1750  Weight: 79.4 kg     Intake/Output Summary (Last 24 hours) at 03/12/2021 1548 Last data filed at 03/12/2021 0827 Gross per 24 hour  Intake 354.18 ml  Output 850 ml  Net -495.82 ml   Net IO Since Admission: -1,893.44 mL [03/12/21 1548]  Pertinent Labs: CBC Latest Ref Rng & Units 03/08/2021 02/08/2021 02/08/2021  WBC 4.0 - 10.5 K/uL 19.9(H) - 16.2(H)  Hemoglobin 12.0 - 15.0 g/dL 9.3(L) 10.5(L) 9.9(L)  Hematocrit 36.0 - 46.0 % 29.1(L) 31.0(L) 31.6(L)  Platelets 150 - 400 K/uL 268 - 262    CMP Latest Ref Rng & Units 03/08/2021 02/19/2021 03/05/2021  Glucose 70 - 99 mg/dL 125(H) 169(H) 174(H)  BUN 8 - 23 mg/dL 199(H) >130(H) 200(H)  Creatinine 0.44 - 1.00 mg/dL 6.80(H) 7.40(H) 7.30(H)  Sodium 135 - 145 mmol/L 145 143 143  Potassium 3.5 - 5.1 mmol/L 4.5 4.8 4.8  Chloride 98 - 111 mmol/L 96(L) 96(L) 94(L)  CO2 22 - 32 mmol/L 28 - 29  Calcium 8.9 - 10.3 mg/dL 9.5 - 9.7  Total Protein 6.5 - 8.1 g/dL - - 9.3(H)  Total Bilirubin 0.3 - 1.2 mg/dL - -  0.9  Alkaline Phos 38 - 126 U/L - - 133(H)  AST 15 - 41 U/L - - 26  ALT 0 - 44 U/L - - 12    Imaging: No results found.  Assessment/Plan:   Principal Problem:   Acute encephalopathy Active Problems:   Endometrial cancer ([place])   Acute renal failure superimposed on stage 4 chronic kidney disease ([employer])   DNR (do not resuscitate)   UTI (urinary tract infection)   Patient Summary: Vanessa [name] is a 65 y.o. 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   Comfort care, DNR ESBL Bacteruria Metastatic endometrial carcinoma ESRD No need to treat ESBL bacteriuria since it is not contributing to her suffering. --Tylenol 650mg  Q6H --Haldol .5mg  Q4h PRN --Dilaudid -.5mg -1mg  Q2H PRN --Ativan 1mg  Q4H  PRN --Keppra for seizure prophylaxis --Continue all comfort measures as needed --Anticipating in-hospital death in the coming days  Diet:  May have comfort feeds from floor stock VTE: None Code: DNR  Merrily Brittle, DO 03/12/2021, 3:48 PM Pager: [phone]  Please contact the on call pager after 5 pm and on weekends at [phone].

## 2021-03-17 IMAGING — DX DG CHEST 1V PORT
1 series · 1 of 1 positions shown · non-contrast
Comparison: Chest x-ray 09/14/2019

CLINICAL DATA: Weakness and leukocytosis.

EXAM:
PORTABLE CHEST 1 VIEW

[chest]
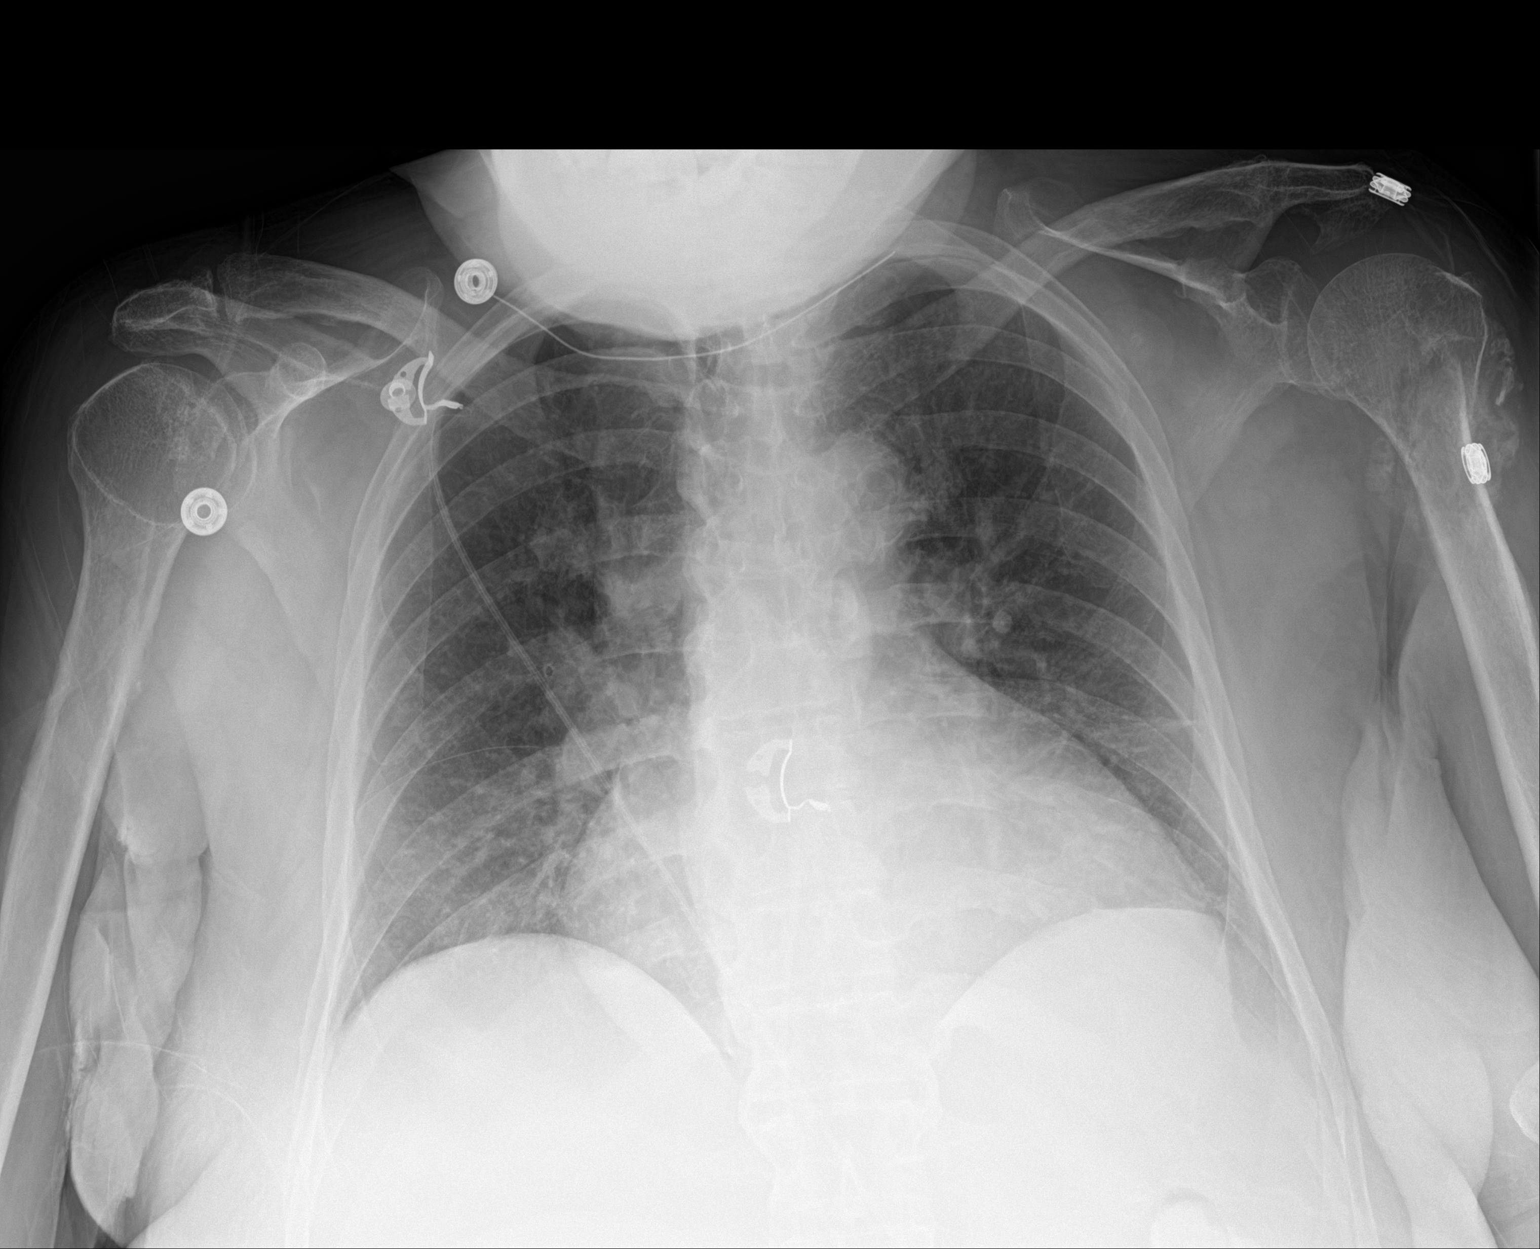

[1 of 1 positions shown; findings below may reference images not displayed]

FINDINGS: The heart is mildly enlarged but stable. Stable mild tortuosity of
the thoracic aorta. Low lung volumes with vascular crowding and
streaky basilar atelectasis. No infiltrates or effusions. No
pulmonary lesions. The bony thorax is intact. Stable healing left
humeral neck fracture.
IMPRESSION: Low lung volumes with vascular crowding and streaky basilar
atelectasis.

## 2021-03-17 IMAGING — CT CT HEAD W/O CM
4 series · 16 of 47 positions shown, 18 images · non-contrast
Comparison: None.

CLINICAL DATA: Encephalopathy

EXAM:
CT HEAD WITHOUT CONTRAST
TECHNIQUE: Contiguous axial images were obtained from the base of the skull
through the vertex without intravenous contrast.

[Series 3: head wo · axial · 0.43mm/px · z∈[+1126,+1246]mm · 7 of 33 slices shown, 9 images]
[im 5/33  brain]
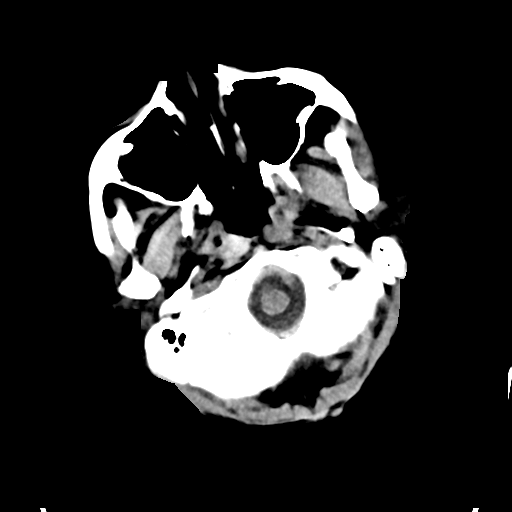
[im 5/33  bone]
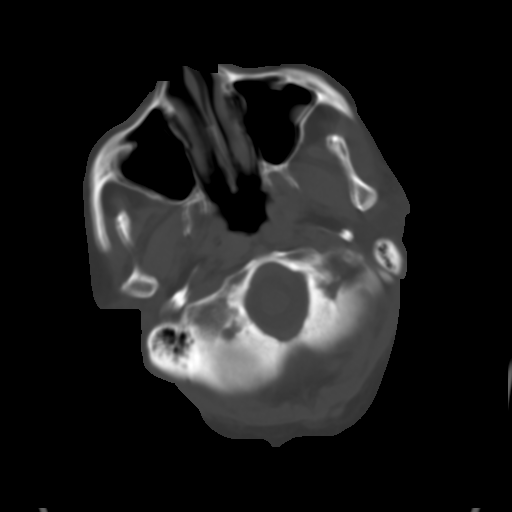
[im 9/33  brain]
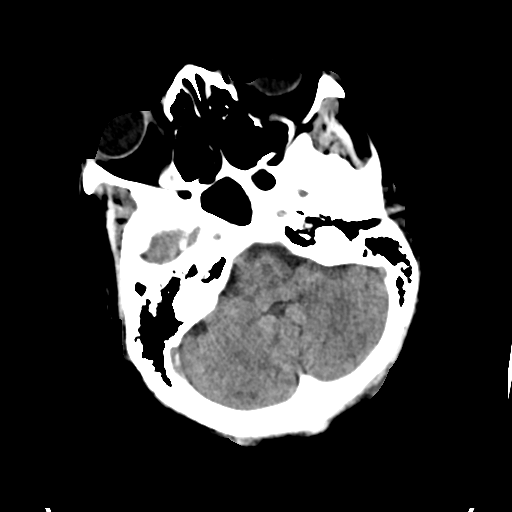
[im 13/33  brain]
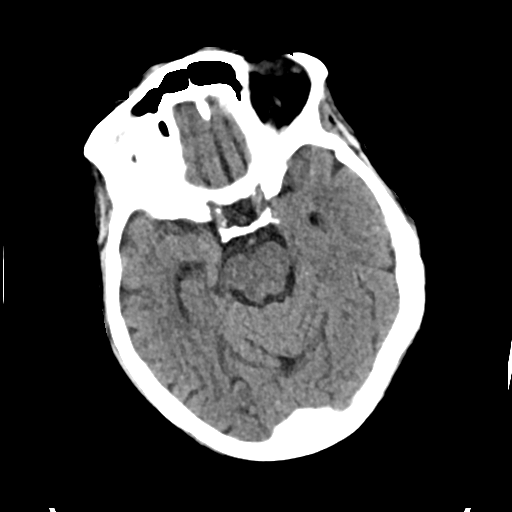
[im 17/33  brain]
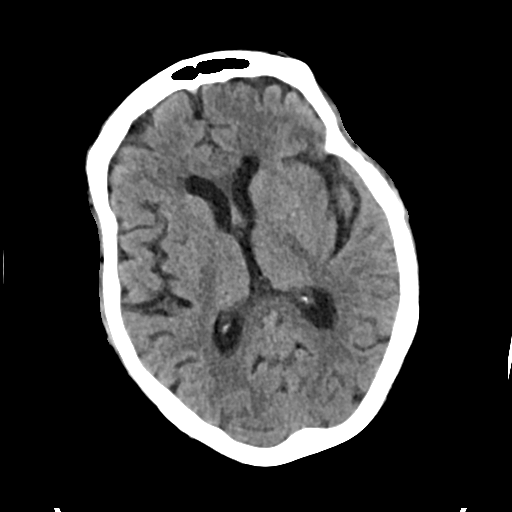
[im 21/33  brain]
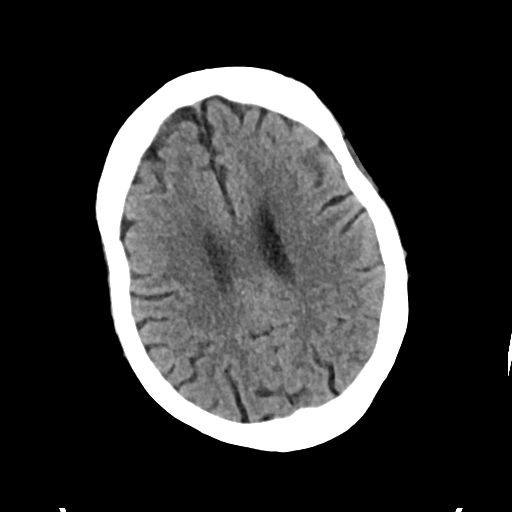
[im 21/33  bone]
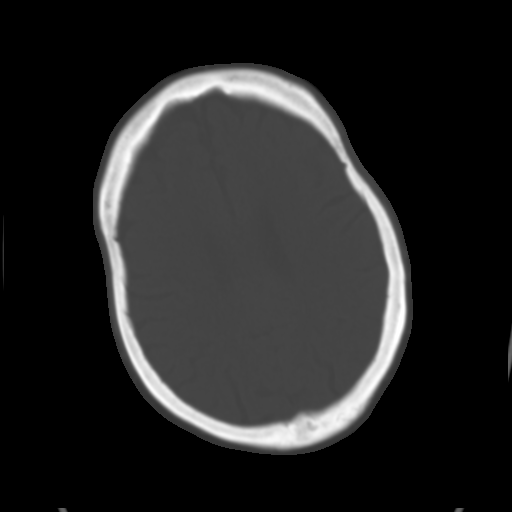
[im 25/33  brain]
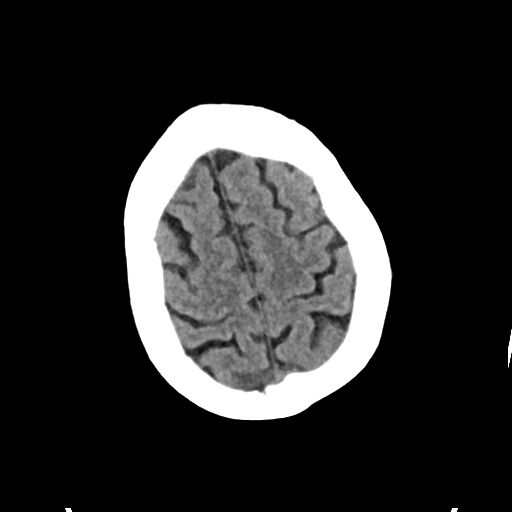
[im 29/33  brain]
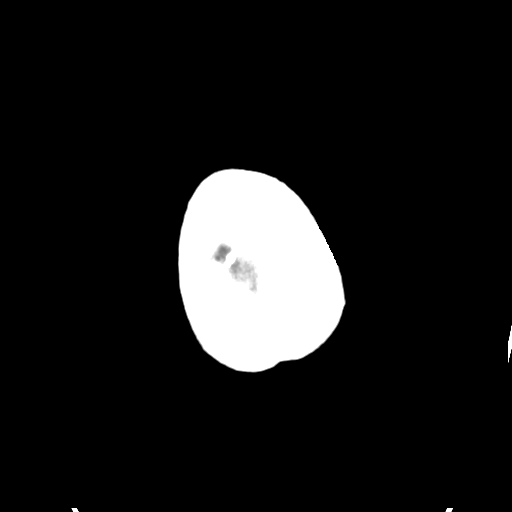

[Series 4: head bone · axial · 0.43mm/px · z∈[+1122,+1154]mm · 3 of 82 slices shown]
[im 9/82  bone]
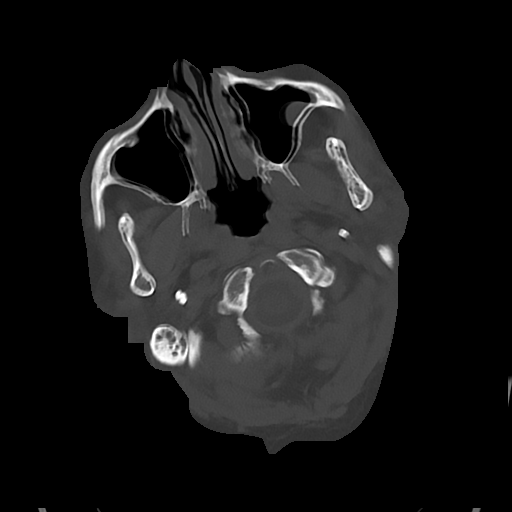
[im 17/82  bone]
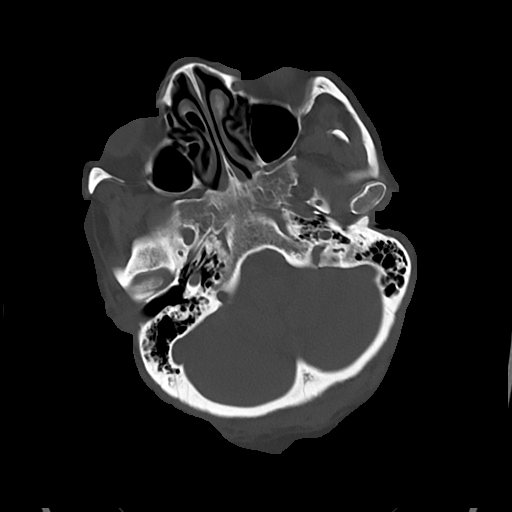
[im 25/82  bone]
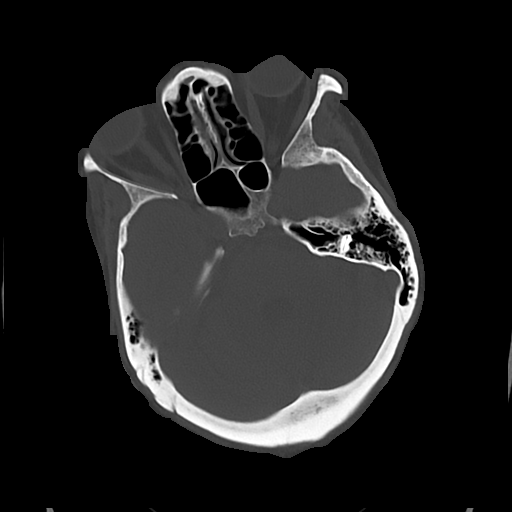

[Series 5: cor soft · coronal · 0.33mm/px · 3 of 69 slices shown]
[im 23/69  brain]
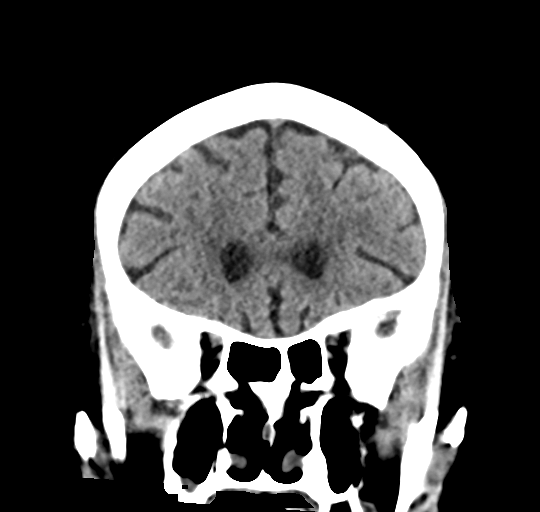
[im 31/69  brain]
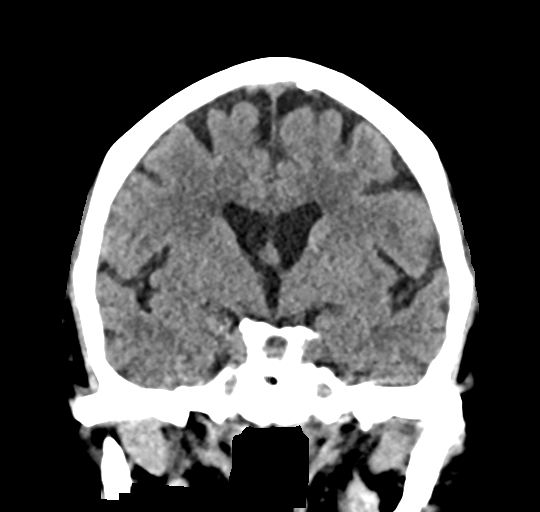
[im 38/69  brain]
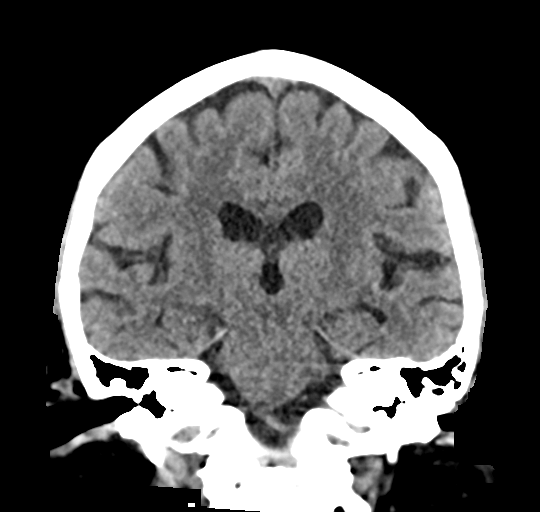

[Series 6: sag soft · sagittal · 0.33mm/px · 3 of 59 slices shown]
[im 20/59  brain]
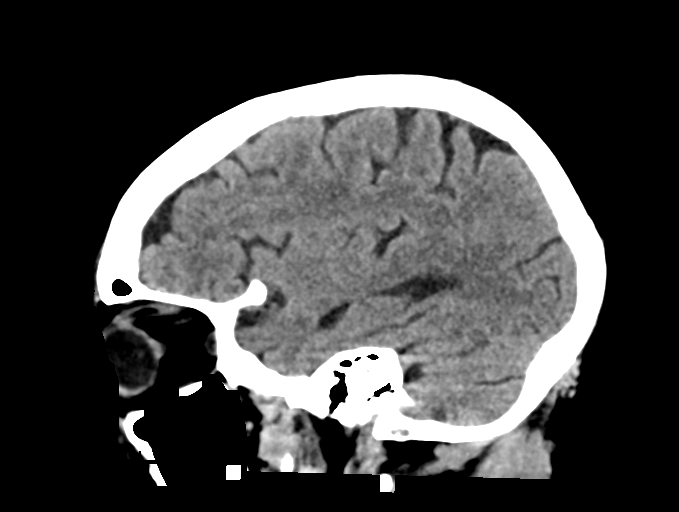
[im 30/59  brain]
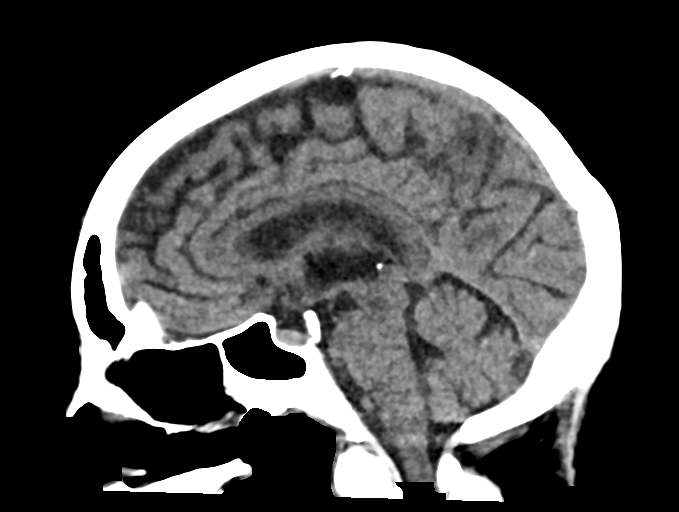
[im 39/59  brain]
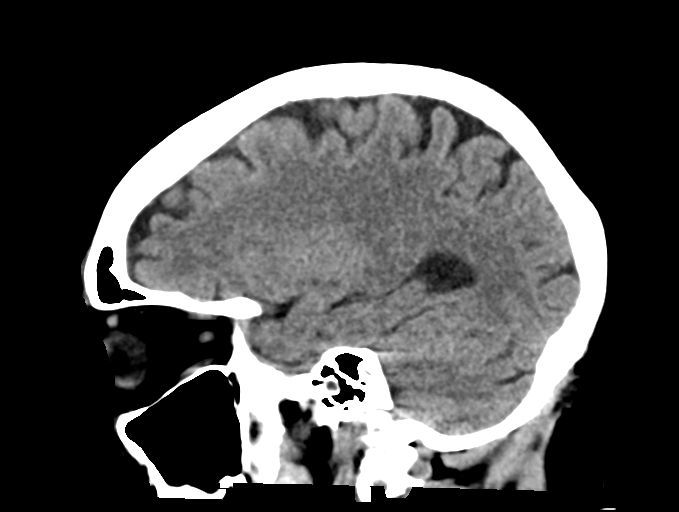

[16 of 47 positions shown; findings below may reference images not displayed]

FINDINGS: Brain: Mild atrophy. Hypodensity right frontal corona radiata
consistent with chronic ischemia. Patchy white matter hypodensity
bilaterally appears chronic. Negative for acute infarct, hemorrhage,
mass.

Vascular: Negative for hyperdense vessel

Skull: Negative

Sinuses/Orbits: Mild mucosal edema paranasal sinuses. Negative
orbit. Soft tissue swelling of the left eyelid consistent with a
dermal lesion.

Other: None
IMPRESSION: No acute abnormality. Mild atrophy and mild chronic microvascular
ischemic change.

## 2021-04-10 NOTE — Discharge Summary (Signed)
  Name: Vanessa [name] MRN: [id number] DOB: [birth year] 65 y.o.  Date of Admission: 02/08/2021  3:58 PM Date of Discharge: 03-14-2021 Attending Physician: Axel Filler, *  Discharge Diagnosis: Principal Problem:   Acute encephalopathy Active Problems:   Endometrial cancer ([place])   Acute renal failure superimposed on stage 4 chronic kidney disease ([place])   DNR (do not resuscitate)   UTI (urinary tract infection)  Cause of death: Multi-organ failure, metastatic endometrial cancer Time of death: 03-14-2021, 1:25AM  Disposition and follow-up:   [name] was discharged from [facility] in expired condition.    Hospital Course: [name] arrived to ED on 8/28 after an unwitnessed fall at assisted living facility. Upon arrival to ED, code stroke was initiated for altered mental status. 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 It was appreciated that patient was also in acute on chronic renal and heart failure. Patient also met criteria for SIRS, concern for possible urinary tract infection/bacteremia. Initially was given Keppra load for possible seizure due to new brain mets and started on broad-spectrum antibiotics. On hospital day 1, IMTS discussed with family regarding prognosis and continued medical management. At that time, patient's family elected to transition to hospice and comfort care measures. Over the next few days, [name] was kept comfortable with IV dilaudid, ativan, and haldol as needed. In the early morning of 03/15/23, RN on floor found patient unresponsive. After examination patient was pronounced dead at 1:25AM.  Signed: Sanjuan Dame, MD Mar 14, 2021, 12:13 PM

## 2021-04-10 NOTE — Progress Notes (Signed)
RN went into room and found patient unresponsive, not breathing, no palpable pulse, no heart sounds when chest auscultated and body starting to cool- all this confirmed by this RN and Ventura Bruns, Agricultural consultant. Time of death pronounced at 41.  Dr. Ileene Musa, nighttime resident notified.  Called daughter [name]'s number twice but no answer, left voicemail for [name] to call RN back but call not returned.  Called daughter Vernie Shanks again at [phone] and notified her of patient's death. Daughter wanted to come see pt but due to patient needing to be transported to morgue, RN notified her that she will not be able to see patient until pt is transported to funeral home. Cabin crew confirmed this with [facility]).   Pt transported to morgue by RN and NT. Gold colored ring with small diamonds removed from left hand and placed in belongings envelope and given to [place], securtiy guard at [place] who confirmed ring description and envelope sealed. Notified [name] of ring in security and per Allena Katz to go to ER and ask for security there to pick it up.  Erlene Quan opportunity to ask any questions and Diplomatic Services operational officer.

## 2021-04-10 DEATH — deceased

## 2021-05-26 ENCOUNTER — Ambulatory Visit: Payer: Medicaid Other | Admitting: Podiatry

## 2021-08-15 IMAGING — CR DG HUMERUS 2V *L*
2 series · 2 of 2 positions shown · non-contrast
Comparison: None.

CLINICAL DATA: Patient fell 10 days ago

EXAM:
LEFT HUMERUS - 2+ VIEW

[x humerus lat left]
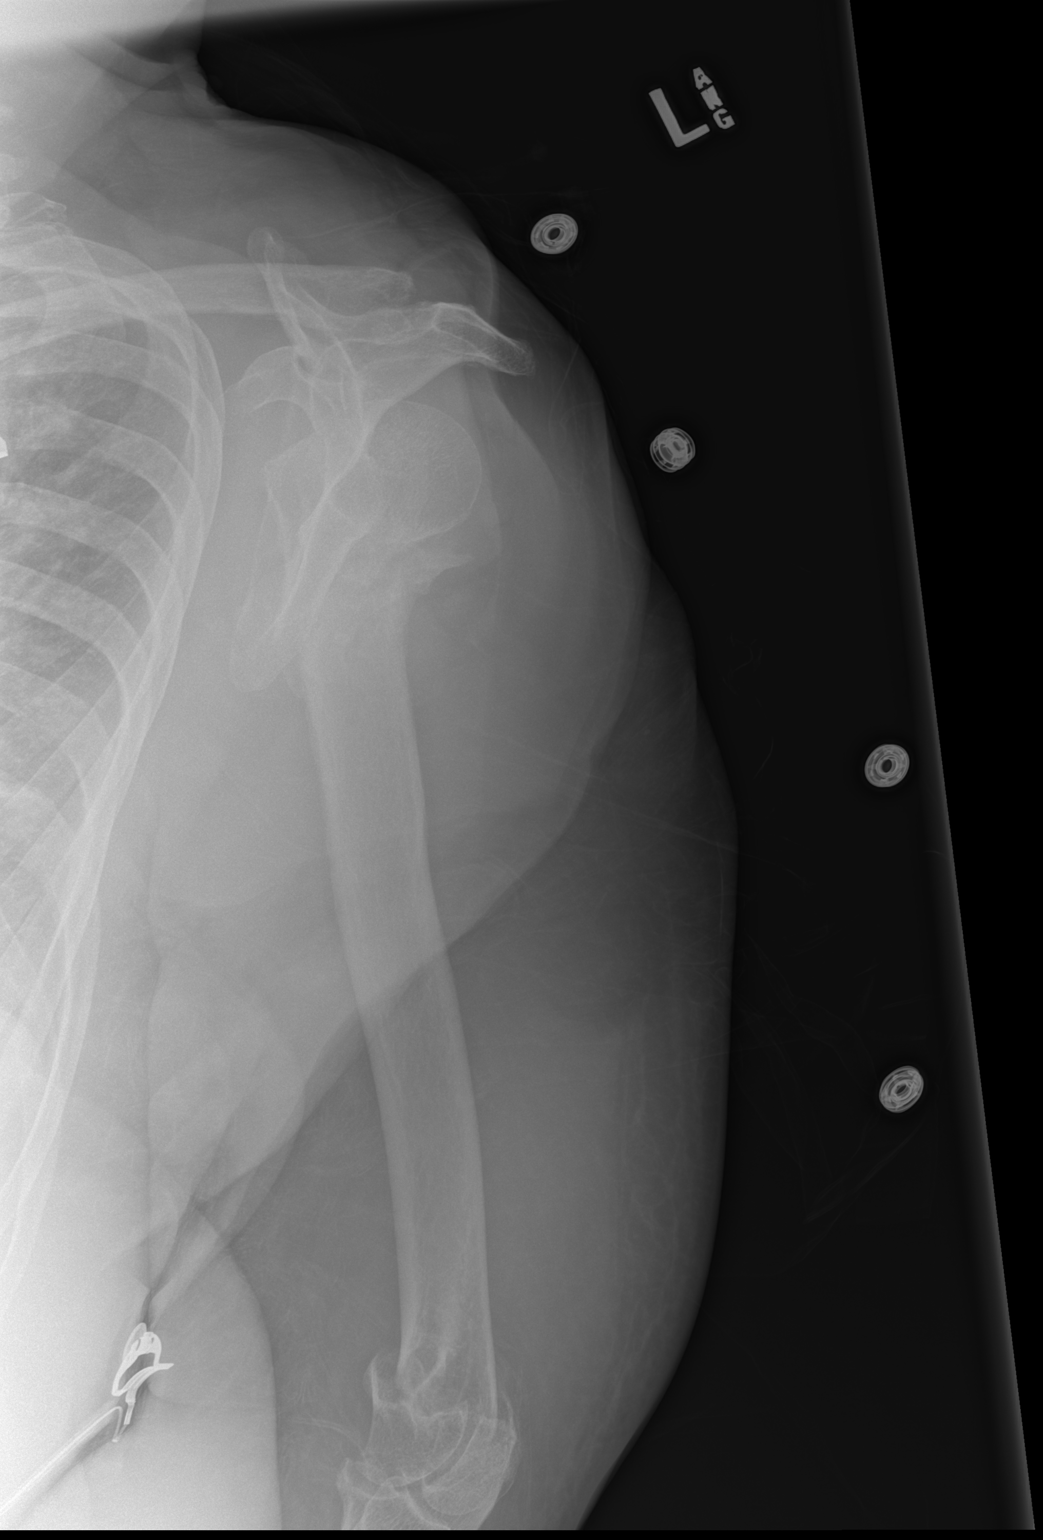

[x humerus ap left]
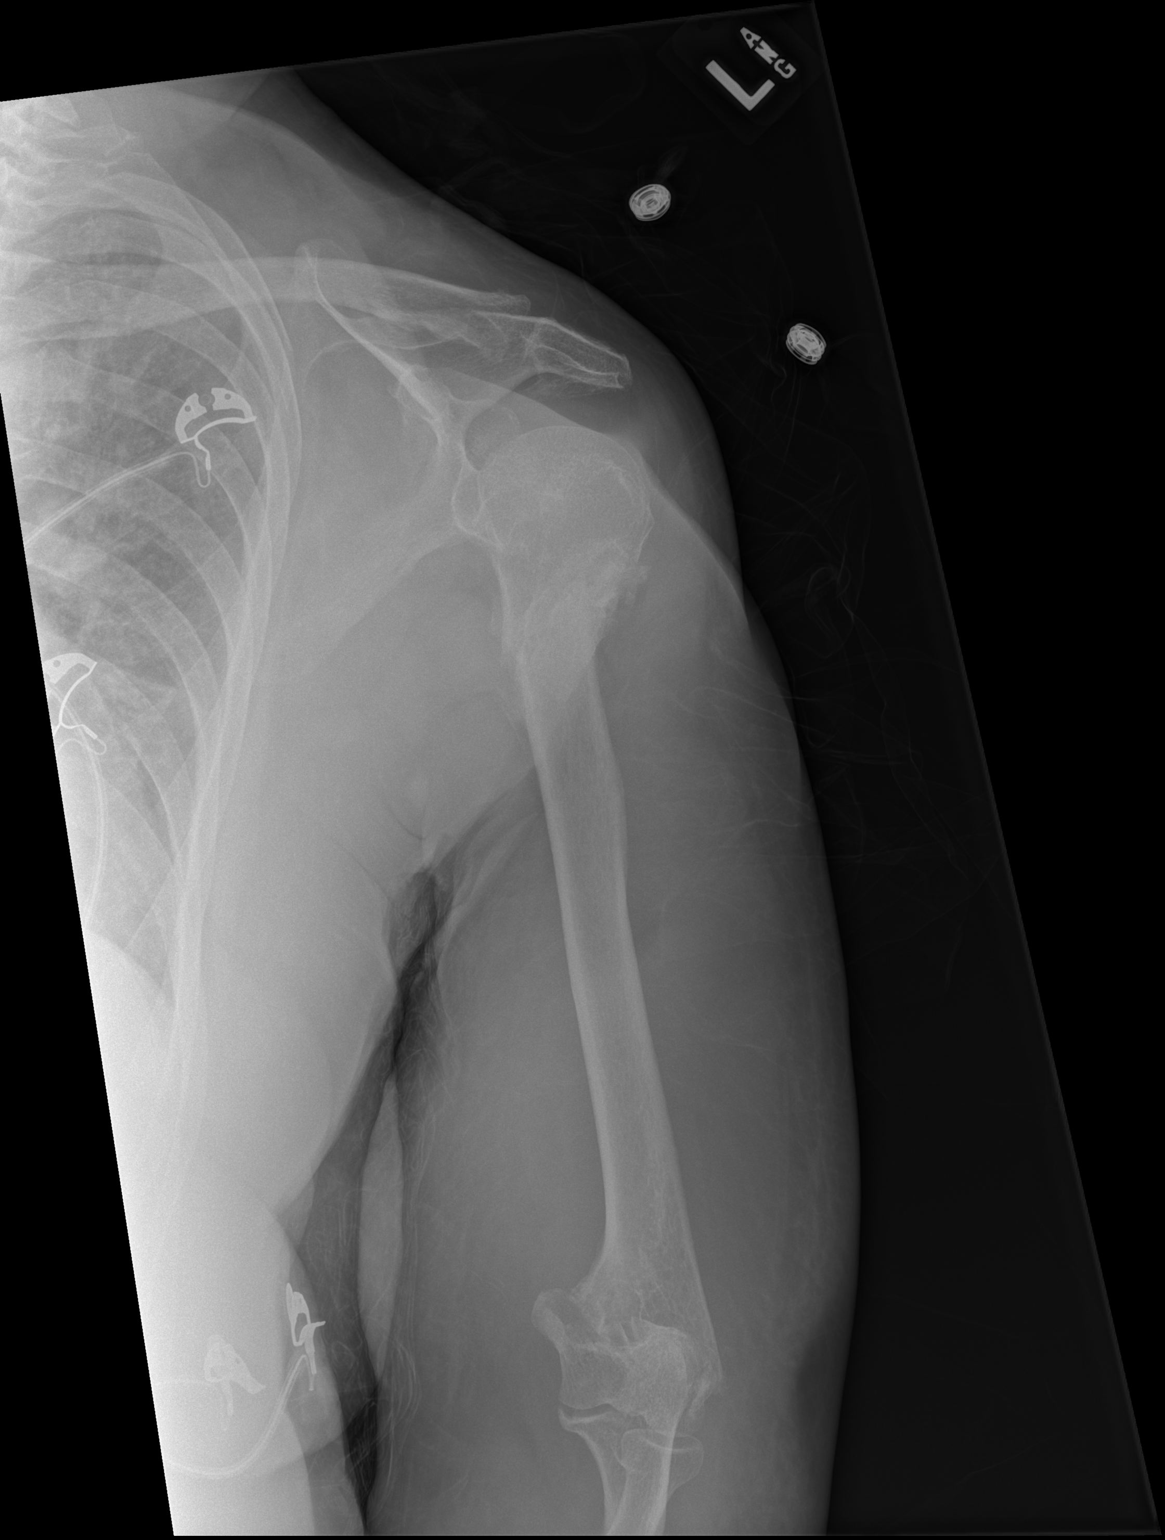

[2 of 2 positions shown; findings below may reference images not displayed]

FINDINGS: Fracture of the proximal humerus at the humeral metaphysis. Mild
lateral angulation. Glenohumeral joint appears intact.
IMPRESSION: Proximal humeral fracture.

## 2022-04-03 IMAGING — US US RENAL
1 series · 14 of 25 positions shown · non-contrast
Comparison: Renal ultrasound 08/09/2020.  CT 11/07/2019

CLINICAL DATA: Acute kidney injury.

EXAM:
RENAL / URINARY TRACT ULTRASOUND COMPLETE

[Series 1: us renal · 14 of 43 slices shown]
[im 1/43]
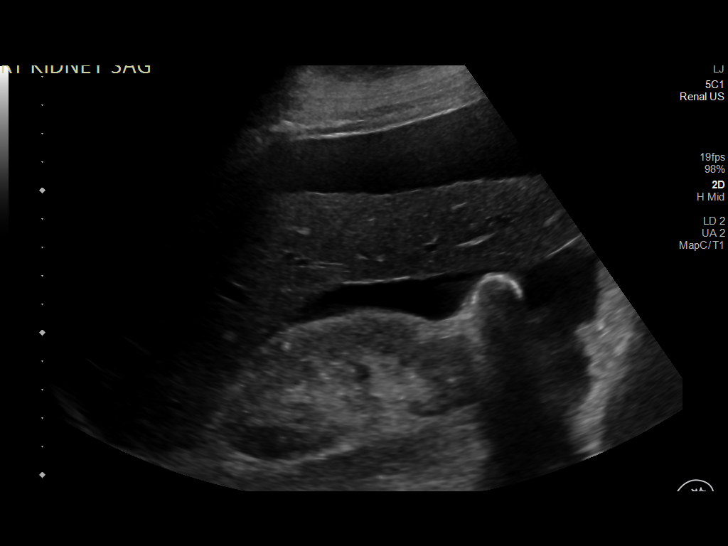
[im 4/43]
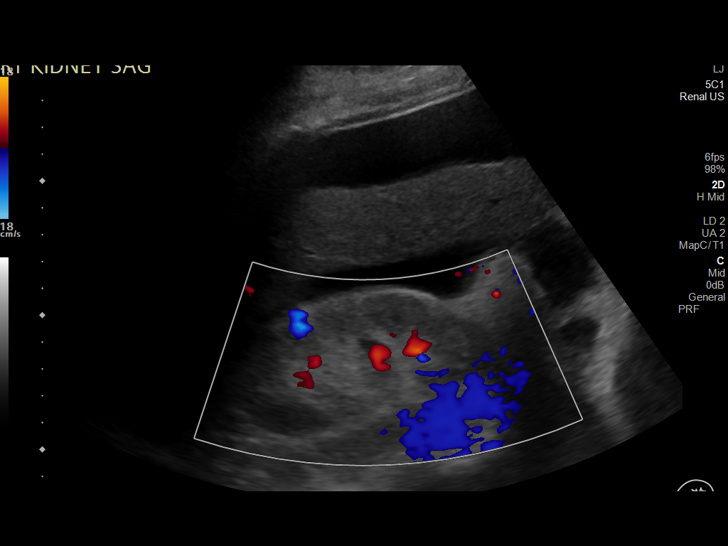
[im 8/43]
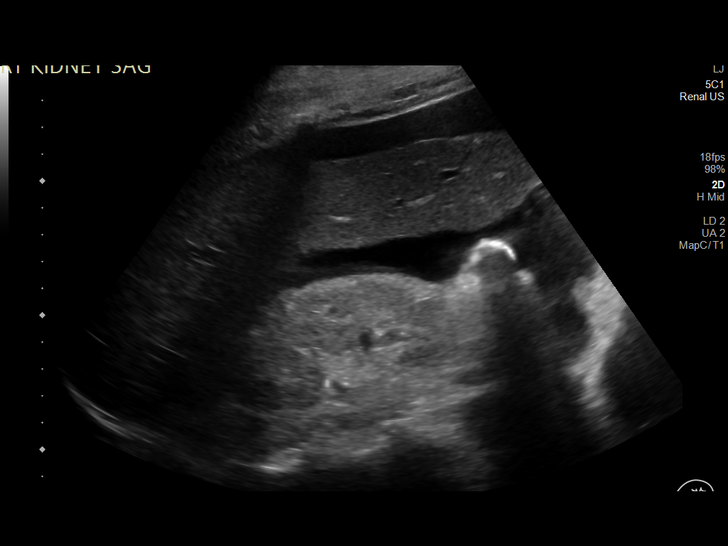
[im 11/43]
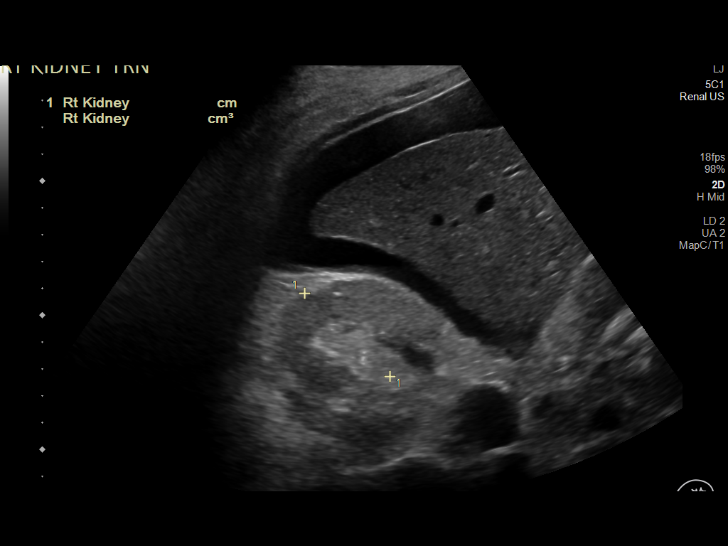
[im 15/43]
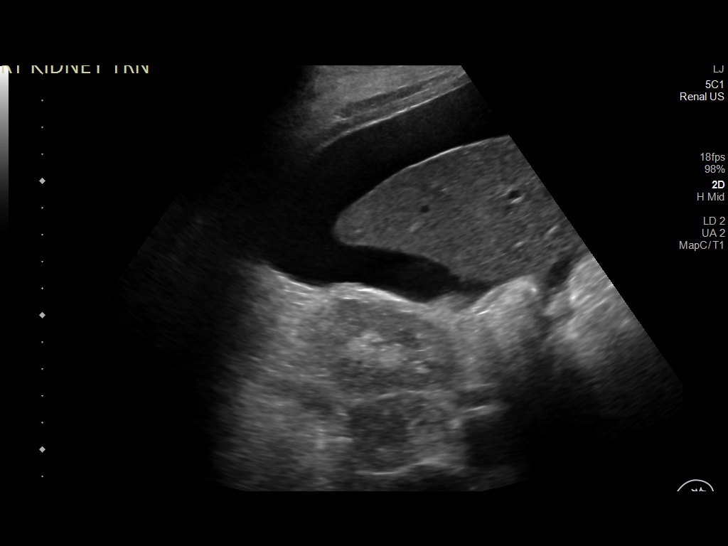
[im 16/43]
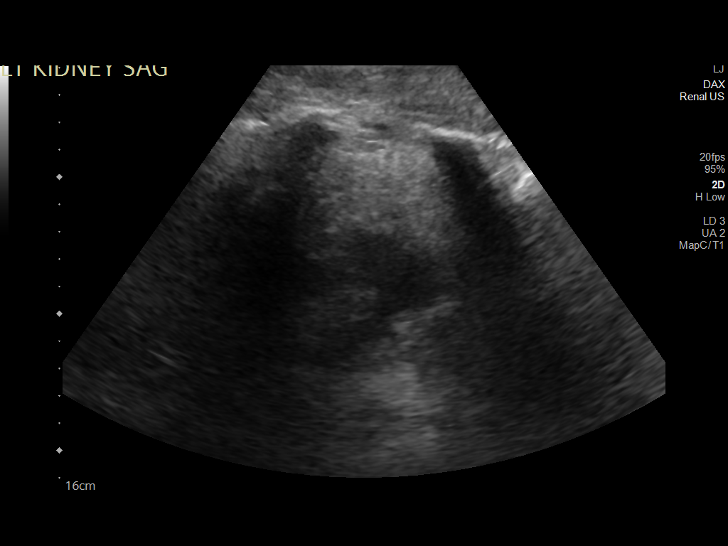
[im 20/43]
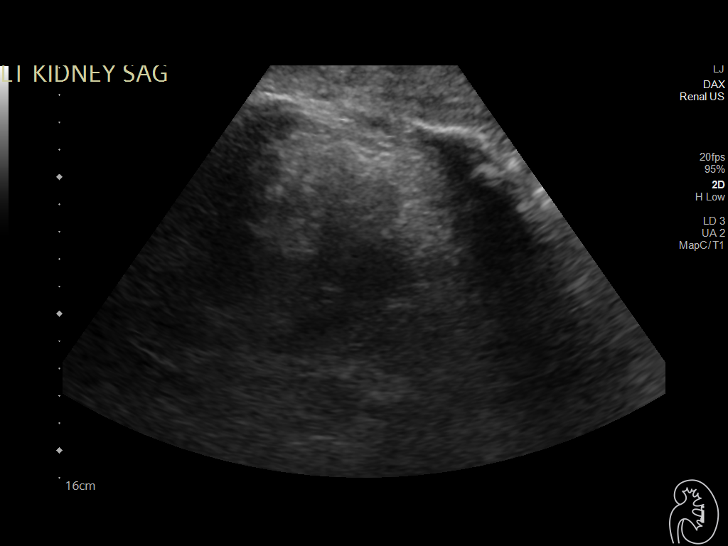
[im 23/43]
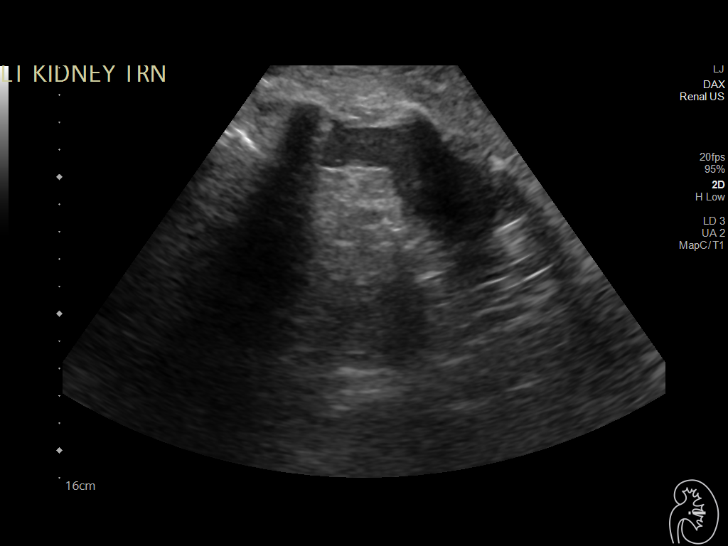
[im 27/43]
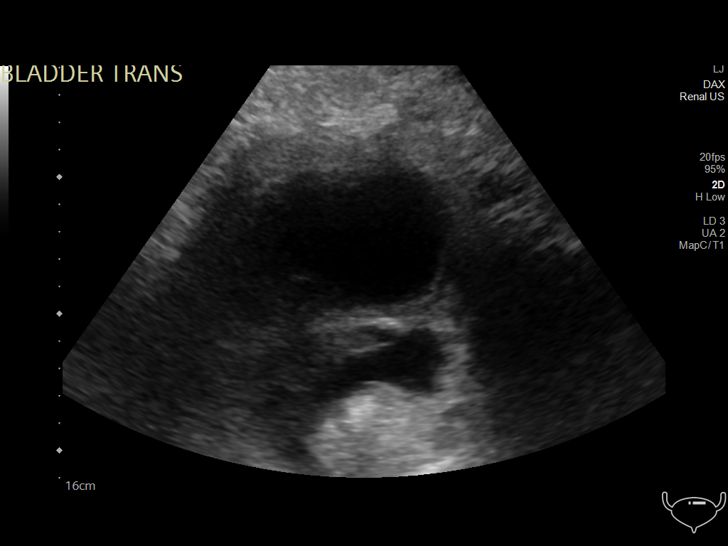
[im 29/43]
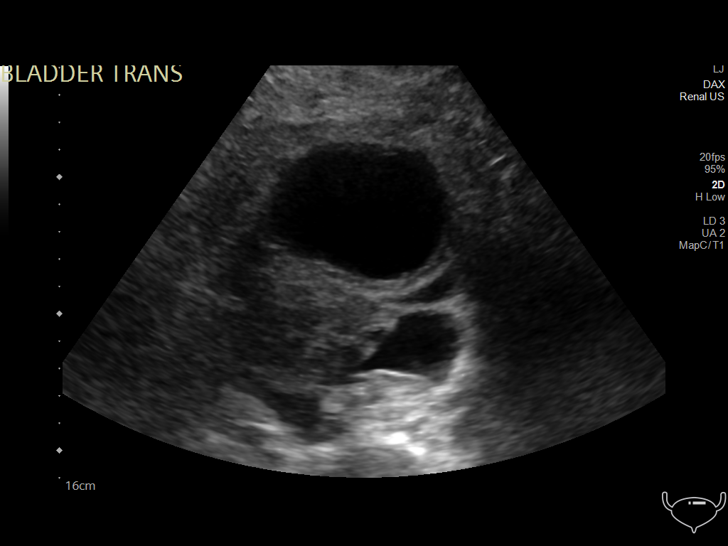
[im 32/43]
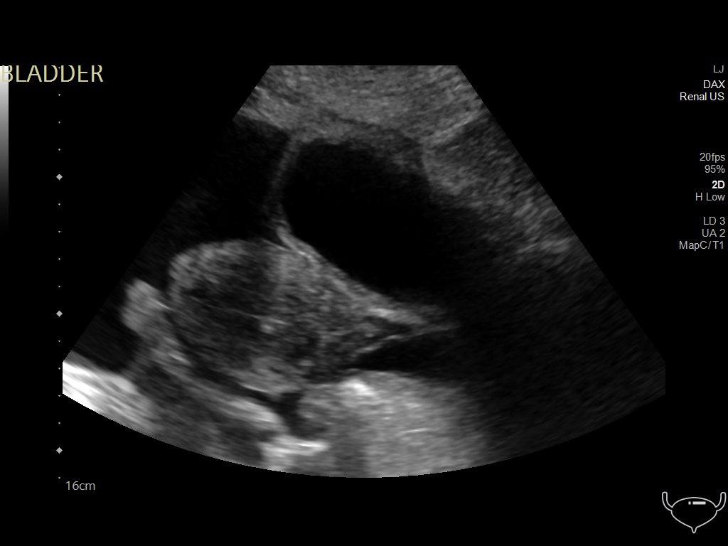
[im 36/43]
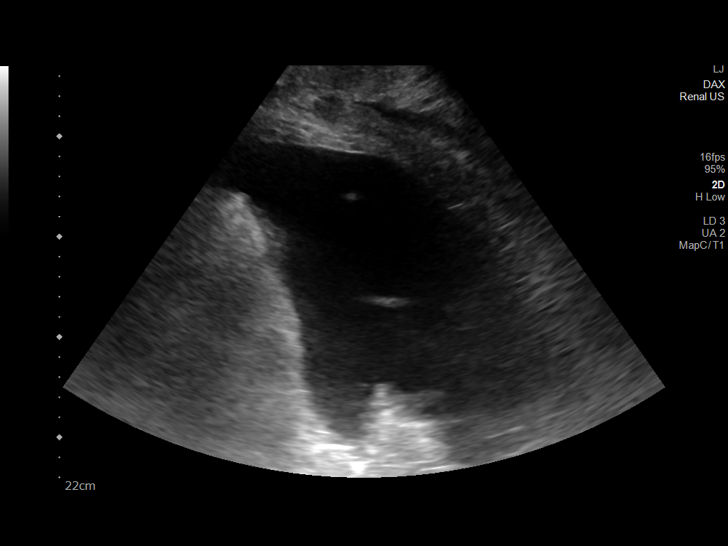
[im 39/43]
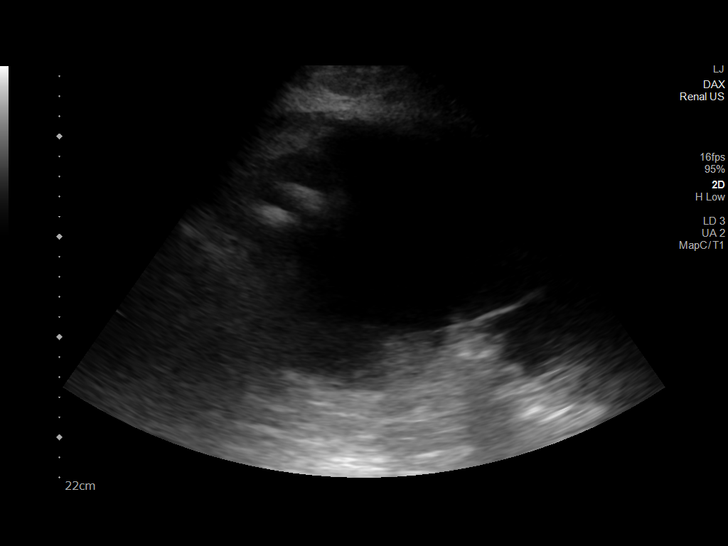
[im 43/43]
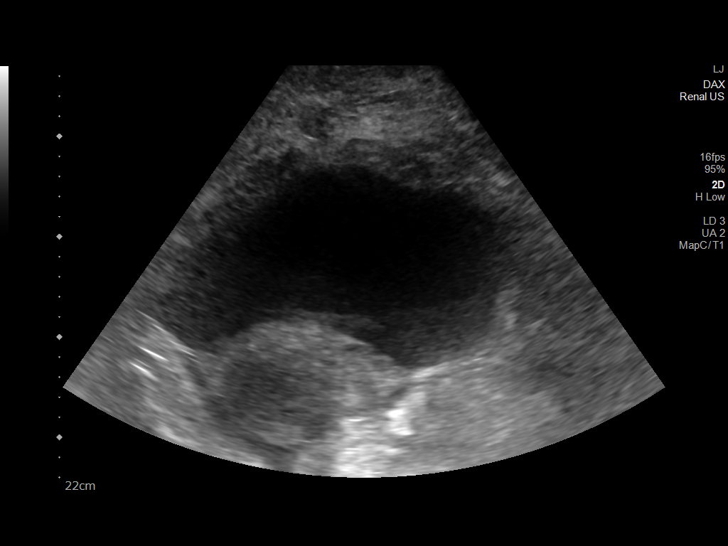

[14 of 25 positions shown; findings below may reference images not displayed]

FINDINGS: Right Kidney:

Renal measurements: 8.8 x 4.1 x 4.4 cm = volume: 84 mL. No
hydronephrosis. There is increased renal parenchymal echogenicity.
No evidence of focal lesion or stone.

Left Kidney:

Renal measurements: 6.7 x 4.1 x 3.3 cm = volume: 47 mL. Kidney is
chronically atrophic, difficult to visualize with ultrasound. No
evidence of hydronephrosis or focal renal abnormality.

Bladder:

Partially distended. Appears normal for degree of bladder
distention.

Other:

Moderate volume abdominopelvic ascites. Technically limited exam due
to habitus.
IMPRESSION: 1. Chronic left renal atrophy.
2. No obstructive uropathy. Mild increased right renal parenchymal
echogenicity most consistent with chronic medical renal disease.
3. Moderate abdominopelvic ascites.
